# Patient Record
Sex: Female | Born: 1935 | Race: White | Hispanic: No | Smoking: Never smoker
Health system: Southern US, Community
[De-identification: ages and names within clinical notes are randomized; demographics above are authoritative.]

## PROBLEM LIST (undated history)

## (undated) DIAGNOSIS — K649 Unspecified hemorrhoids: Secondary | ICD-10-CM

## (undated) DIAGNOSIS — I499 Cardiac arrhythmia, unspecified: Secondary | ICD-10-CM

## (undated) DIAGNOSIS — K760 Fatty (change of) liver, not elsewhere classified: Secondary | ICD-10-CM

## (undated) DIAGNOSIS — Z9889 Other specified postprocedural states: Secondary | ICD-10-CM

## (undated) DIAGNOSIS — E785 Hyperlipidemia, unspecified: Secondary | ICD-10-CM

## (undated) DIAGNOSIS — I5030 Unspecified diastolic (congestive) heart failure: Secondary | ICD-10-CM

## (undated) DIAGNOSIS — R0600 Dyspnea, unspecified: Secondary | ICD-10-CM

## (undated) DIAGNOSIS — I6529 Occlusion and stenosis of unspecified carotid artery: Secondary | ICD-10-CM

## (undated) DIAGNOSIS — Z950 Presence of cardiac pacemaker: Secondary | ICD-10-CM

## (undated) DIAGNOSIS — R911 Solitary pulmonary nodule: Secondary | ICD-10-CM

## (undated) DIAGNOSIS — G500 Trigeminal neuralgia: Secondary | ICD-10-CM

## (undated) DIAGNOSIS — J309 Allergic rhinitis, unspecified: Secondary | ICD-10-CM

## (undated) DIAGNOSIS — M1990 Unspecified osteoarthritis, unspecified site: Secondary | ICD-10-CM

## (undated) DIAGNOSIS — T8859XA Other complications of anesthesia, initial encounter: Secondary | ICD-10-CM

## (undated) DIAGNOSIS — L659 Nonscarring hair loss, unspecified: Secondary | ICD-10-CM

## (undated) DIAGNOSIS — K5730 Diverticulosis of large intestine without perforation or abscess without bleeding: Secondary | ICD-10-CM

## (undated) DIAGNOSIS — E039 Hypothyroidism, unspecified: Secondary | ICD-10-CM

## (undated) DIAGNOSIS — N3946 Mixed incontinence: Secondary | ICD-10-CM

## (undated) DIAGNOSIS — I4891 Unspecified atrial fibrillation: Secondary | ICD-10-CM

## (undated) DIAGNOSIS — Z86007 Personal history of in-situ neoplasm of skin: Secondary | ICD-10-CM

## (undated) DIAGNOSIS — R600 Localized edema: Secondary | ICD-10-CM

## (undated) DIAGNOSIS — Z86010 Personal history of colonic polyps: Secondary | ICD-10-CM

## (undated) DIAGNOSIS — Z860101 Personal history of adenomatous and serrated colon polyps: Secondary | ICD-10-CM

## (undated) DIAGNOSIS — J4520 Mild intermittent asthma, uncomplicated: Secondary | ICD-10-CM

## (undated) DIAGNOSIS — Z8673 Personal history of transient ischemic attack (TIA), and cerebral infarction without residual deficits: Secondary | ICD-10-CM

## (undated) DIAGNOSIS — D649 Anemia, unspecified: Secondary | ICD-10-CM

## (undated) DIAGNOSIS — R011 Cardiac murmur, unspecified: Secondary | ICD-10-CM

## (undated) DIAGNOSIS — Z973 Presence of spectacles and contact lenses: Secondary | ICD-10-CM

## (undated) DIAGNOSIS — I8390 Asymptomatic varicose veins of unspecified lower extremity: Secondary | ICD-10-CM

## (undated) DIAGNOSIS — Z8679 Personal history of other diseases of the circulatory system: Secondary | ICD-10-CM

## (undated) DIAGNOSIS — T4145XA Adverse effect of unspecified anesthetic, initial encounter: Secondary | ICD-10-CM

## (undated) DIAGNOSIS — M7989 Other specified soft tissue disorders: Secondary | ICD-10-CM

## (undated) HISTORY — PX: OTHER SURGICAL HISTORY: SHX169

## (undated) HISTORY — PX: TRANSTHORACIC ECHOCARDIOGRAM: SHX275

## (undated) HISTORY — DX: Unspecified atrial fibrillation: I48.91

## (undated) HISTORY — DX: Hypothyroidism, unspecified: E03.9

## (undated) HISTORY — DX: Nonscarring hair loss, unspecified: L65.9

## (undated) HISTORY — DX: Fatty (change of) liver, not elsewhere classified: K76.0

## (undated) HISTORY — DX: Hyperlipidemia, unspecified: E78.5

## (undated) HISTORY — DX: Solitary pulmonary nodule: R91.1

## (undated) HISTORY — PX: CARDIOVERSION: SHX1299

## (undated) HISTORY — DX: Occlusion and stenosis of unspecified carotid artery: I65.29

## (undated) HISTORY — PX: DILATION AND CURETTAGE OF UTERUS: SHX78

## (undated) HISTORY — PX: COLONOSCOPY: SHX174

## (undated) HISTORY — PX: EXCISIONAL HEMORRHOIDECTOMY: SHX1541

## (undated) HISTORY — PX: TEE WITH CARDIOVERSION: SHX5442

## (undated) HISTORY — PX: CARDIAC ELECTROPHYSIOLOGY STUDY AND ABLATION: SHX1294

## (undated) HISTORY — PX: CARDIAC ELECTROPHYSIOLOGY MAPPING AND ABLATION: SHX1292

---
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
## 1976-03-23 HISTORY — PX: TUBAL LIGATION: SHX77

## 1976-03-23 HISTORY — PX: APPENDECTOMY: SHX54

## 1998-10-23 HISTORY — PX: MITRAL VALVE ANNULOPLASTY: SHX2038

## 2003-12-17 ENCOUNTER — Other Ambulatory Visit: Admission: RE | Admit: 2003-12-17 | Discharge: 2003-12-17 | Payer: Self-pay | Admitting: Obstetrics and Gynecology

## 2004-01-22 ENCOUNTER — Encounter: Payer: Self-pay | Admitting: Family Medicine

## 2004-01-22 LAB — CONVERTED CEMR LAB

## 2004-01-22 LAB — HM MAMMOGRAPHY

## 2004-02-06 ENCOUNTER — Emergency Department (HOSPITAL_COMMUNITY): Admission: EM | Admit: 2004-02-06 | Discharge: 2004-02-06 | Payer: Self-pay | Admitting: Family Medicine

## 2004-04-28 ENCOUNTER — Ambulatory Visit: Payer: Self-pay | Admitting: Family Medicine

## 2004-05-07 ENCOUNTER — Ambulatory Visit: Payer: Self-pay | Admitting: Family Medicine

## 2004-07-01 ENCOUNTER — Ambulatory Visit: Payer: Self-pay | Admitting: Family Medicine

## 2004-08-19 ENCOUNTER — Ambulatory Visit: Payer: Self-pay | Admitting: Family Medicine

## 2005-01-24 ENCOUNTER — Inpatient Hospital Stay (HOSPITAL_COMMUNITY): Admission: EM | Admit: 2005-01-24 | Discharge: 2005-01-26 | Payer: Self-pay | Admitting: Emergency Medicine

## 2005-02-03 ENCOUNTER — Ambulatory Visit: Payer: Self-pay | Admitting: Family Medicine

## 2005-03-02 ENCOUNTER — Ambulatory Visit: Payer: Self-pay | Admitting: Family Medicine

## 2005-03-30 ENCOUNTER — Ambulatory Visit: Payer: Self-pay | Admitting: Cardiology

## 2005-04-27 ENCOUNTER — Ambulatory Visit: Payer: Self-pay | Admitting: Cardiology

## 2005-12-12 ENCOUNTER — Inpatient Hospital Stay (HOSPITAL_COMMUNITY): Admission: EM | Admit: 2005-12-12 | Discharge: 2005-12-14 | Payer: Self-pay | Admitting: Emergency Medicine

## 2005-12-12 ENCOUNTER — Ambulatory Visit: Payer: Self-pay | Admitting: Internal Medicine

## 2005-12-14 ENCOUNTER — Ambulatory Visit: Payer: Self-pay | Admitting: Internal Medicine

## 2005-12-15 ENCOUNTER — Ambulatory Visit: Payer: Self-pay | Admitting: Family Medicine

## 2005-12-17 ENCOUNTER — Ambulatory Visit: Payer: Self-pay | Admitting: Family Medicine

## 2005-12-21 ENCOUNTER — Ambulatory Visit: Payer: Self-pay | Admitting: Family Medicine

## 2005-12-23 ENCOUNTER — Ambulatory Visit: Payer: Self-pay | Admitting: Family Medicine

## 2005-12-24 ENCOUNTER — Ambulatory Visit: Payer: Self-pay | Admitting: Family Medicine

## 2005-12-28 ENCOUNTER — Ambulatory Visit: Payer: Self-pay | Admitting: Family Medicine

## 2005-12-29 ENCOUNTER — Ambulatory Visit: Payer: Self-pay | Admitting: Cardiology

## 2006-01-01 ENCOUNTER — Ambulatory Visit: Payer: Self-pay | Admitting: Family Medicine

## 2006-01-05 ENCOUNTER — Ambulatory Visit: Payer: Self-pay | Admitting: Family Medicine

## 2006-01-11 ENCOUNTER — Ambulatory Visit: Payer: Self-pay

## 2006-01-11 ENCOUNTER — Ambulatory Visit: Payer: Self-pay | Admitting: Family Medicine

## 2006-01-11 ENCOUNTER — Encounter: Payer: Self-pay | Admitting: Cardiology

## 2006-01-12 ENCOUNTER — Ambulatory Visit: Payer: Self-pay | Admitting: Cardiology

## 2006-01-18 ENCOUNTER — Ambulatory Visit: Payer: Self-pay | Admitting: Family Medicine

## 2006-01-25 ENCOUNTER — Ambulatory Visit: Payer: Self-pay | Admitting: Family Medicine

## 2006-02-02 ENCOUNTER — Ambulatory Visit: Payer: Self-pay | Admitting: Cardiology

## 2006-02-08 ENCOUNTER — Ambulatory Visit: Payer: Self-pay | Admitting: Family Medicine

## 2006-02-22 ENCOUNTER — Ambulatory Visit: Payer: Self-pay | Admitting: Family Medicine

## 2006-03-22 ENCOUNTER — Ambulatory Visit: Payer: Self-pay | Admitting: Family Medicine

## 2006-03-27 ENCOUNTER — Inpatient Hospital Stay: Payer: Self-pay | Admitting: Internal Medicine

## 2006-03-27 ENCOUNTER — Ambulatory Visit: Payer: Self-pay | Admitting: Internal Medicine

## 2006-03-27 ENCOUNTER — Other Ambulatory Visit: Payer: Self-pay

## 2006-04-02 ENCOUNTER — Ambulatory Visit: Payer: Self-pay | Admitting: Family Medicine

## 2006-04-15 ENCOUNTER — Ambulatory Visit: Payer: Self-pay | Admitting: Cardiology

## 2006-04-15 LAB — CONVERTED CEMR LAB
ALT: 25 units/L (ref 0–40)
AST: 23 units/L (ref 0–37)
Albumin: 3.7 g/dL (ref 3.5–5.2)
Alkaline Phosphatase: 41 units/L (ref 39–117)
Bilirubin, Direct: 0.1 mg/dL (ref 0.0–0.3)
HDL: 38.9 mg/dL — ABNORMAL LOW (ref >39.0)
Total Bilirubin: 0.7 mg/dL (ref 0.3–1.2)
Total CHOL/HDL Ratio: 4.3
Total CK: 57 units/L (ref 7–177)
Triglycerides: 123 mg/dL (ref 0–149)
VLDL: 25 mg/dL (ref 0–40)

## 2006-04-19 ENCOUNTER — Ambulatory Visit: Payer: Self-pay | Admitting: Family Medicine

## 2006-04-21 ENCOUNTER — Ambulatory Visit: Payer: Self-pay | Admitting: Family Medicine

## 2006-05-03 ENCOUNTER — Ambulatory Visit: Payer: Self-pay | Admitting: Family Medicine

## 2006-05-05 ENCOUNTER — Ambulatory Visit: Payer: Self-pay | Admitting: Internal Medicine

## 2006-05-05 LAB — CONVERTED CEMR LAB
BUN: 16 mg/dL (ref 6–23)
Basophils Absolute: 0 10*3/uL (ref 0.0–0.1)
Basophils Relative: 0.7 % (ref 0.0–1.0)
CO2: 32 meq/L (ref 19–32)
Calcium: 9.2 mg/dL (ref 8.4–10.5)
Chloride: 106 meq/L (ref 96–112)
Creatinine, Ser: 0.7 mg/dL (ref 0.4–1.2)
Eosinophils Absolute: 0.2 10*3/uL (ref 0.0–0.6)
Eosinophils Relative: 4 % (ref 0.0–5.0)
GFR calc Af Amer: 106 mL/min
GFR calc non Af Amer: 88 mL/min
Glucose, Bld: 93 mg/dL (ref 70–99)
HCT: 38.6 % (ref 36.0–46.0)
Hemoglobin: 13.2 g/dL (ref 12.0–15.0)
INR: 2.8 — ABNORMAL HIGH (ref 0.9–2.0)
Lymphocytes Relative: 39.4 % (ref 12.0–46.0)
MCHC: 34.1 g/dL (ref 30.0–36.0)
MCV: 84.2 fL (ref 78.0–100.0)
Monocytes Absolute: 0.4 10*3/uL (ref 0.2–0.7)
Monocytes Relative: 9.1 % (ref 3.0–11.0)
Neutro Abs: 2.4 10*3/uL (ref 1.4–7.7)
Neutrophils Relative %: 46.8 % (ref 43.0–77.0)
Platelets: 172 10*3/uL (ref 150–400)
Potassium: 4.2 meq/L (ref 3.5–5.1)
Prothrombin Time: 21.3 s — ABNORMAL HIGH (ref 10.0–14.0)
RBC: 4.59 M/uL (ref 3.87–5.11)
RDW: 14.2 % (ref 11.5–14.6)
Sodium: 145 meq/L (ref 135–145)
WBC: 4.9 10*3/uL (ref 4.5–10.5)
aPTT: 36.3 s (ref 26.5–36.5)

## 2006-05-06 ENCOUNTER — Ambulatory Visit (HOSPITAL_COMMUNITY): Admission: RE | Admit: 2006-05-06 | Discharge: 2006-05-06 | Payer: Self-pay | Admitting: Internal Medicine

## 2006-05-06 ENCOUNTER — Encounter: Payer: Self-pay | Admitting: Internal Medicine

## 2006-05-06 ENCOUNTER — Ambulatory Visit: Payer: Self-pay | Admitting: Cardiology

## 2006-05-13 ENCOUNTER — Ambulatory Visit: Admission: RE | Admit: 2006-05-13 | Discharge: 2006-05-13 | Payer: Self-pay | Admitting: Internal Medicine

## 2006-05-14 ENCOUNTER — Ambulatory Visit: Payer: Self-pay | Admitting: Internal Medicine

## 2006-05-14 LAB — CONVERTED CEMR LAB
ALT: 59 units/L — ABNORMAL HIGH (ref 0–40)
Albumin: 3.6 g/dL (ref 3.5–5.2)
Alkaline Phosphatase: 49 units/L (ref 39–117)
Bilirubin, Direct: 0.1 mg/dL (ref 0.0–0.3)
Total Protein: 6.2 g/dL (ref 6.0–8.3)

## 2006-05-17 ENCOUNTER — Ambulatory Visit: Payer: Self-pay | Admitting: Family Medicine

## 2006-05-24 ENCOUNTER — Ambulatory Visit: Payer: Self-pay | Admitting: Family Medicine

## 2006-05-28 ENCOUNTER — Ambulatory Visit: Payer: Self-pay | Admitting: Family Medicine

## 2006-06-03 ENCOUNTER — Ambulatory Visit: Payer: Self-pay | Admitting: Internal Medicine

## 2006-06-07 ENCOUNTER — Ambulatory Visit: Payer: Self-pay | Admitting: Family Medicine

## 2006-06-07 LAB — CONVERTED CEMR LAB
ALT: 32 units/L (ref 0–40)
AST: 29 units/L (ref 0–37)
Albumin: 3.8 g/dL (ref 3.5–5.2)
Alkaline Phosphatase: 41 units/L (ref 39–117)
Bilirubin, Direct: 0.1 mg/dL (ref 0.0–0.3)
Total Bilirubin: 0.7 mg/dL (ref 0.3–1.2)
Total Protein: 6.6 g/dL (ref 6.0–8.3)

## 2006-06-17 ENCOUNTER — Ambulatory Visit: Payer: Self-pay | Admitting: Family Medicine

## 2006-06-18 ENCOUNTER — Ambulatory Visit: Payer: Self-pay | Admitting: Internal Medicine

## 2006-06-18 ENCOUNTER — Ambulatory Visit (HOSPITAL_COMMUNITY): Admission: RE | Admit: 2006-06-18 | Discharge: 2006-06-18 | Payer: Self-pay | Admitting: Internal Medicine

## 2006-06-25 ENCOUNTER — Ambulatory Visit: Payer: Self-pay | Admitting: Family Medicine

## 2006-07-08 ENCOUNTER — Ambulatory Visit: Payer: Self-pay | Admitting: Family Medicine

## 2006-07-12 ENCOUNTER — Ambulatory Visit: Payer: Self-pay | Admitting: Internal Medicine

## 2006-07-12 LAB — CONVERTED CEMR LAB
BUN: 19 mg/dL (ref 6–23)
Basophils Absolute: 0 10*3/uL (ref 0.0–0.1)
Basophils Relative: 0.5 % (ref 0.0–1.0)
CO2: 31 meq/L (ref 19–32)
Calcium: 9.1 mg/dL (ref 8.4–10.5)
Chloride: 107 meq/L (ref 96–112)
Creatinine, Ser: 0.8 mg/dL (ref 0.4–1.2)
Eosinophils Absolute: 0.2 10*3/uL (ref 0.0–0.6)
Eosinophils Relative: 5.2 % — ABNORMAL HIGH (ref 0.0–5.0)
GFR calc Af Amer: 91 mL/min
GFR calc non Af Amer: 75 mL/min
Glucose, Bld: 78 mg/dL (ref 70–99)
HCT: 37.7 % (ref 36.0–46.0)
Hemoglobin: 12.9 g/dL (ref 12.0–15.0)
INR: 2.2 — ABNORMAL HIGH (ref 0.9–2.0)
Lymphocytes Relative: 33.6 % (ref 12.0–46.0)
MCHC: 34.2 g/dL (ref 30.0–36.0)
MCV: 82.9 fL (ref 78.0–100.0)
Monocytes Absolute: 0.5 10*3/uL (ref 0.2–0.7)
Monocytes Relative: 15.1 % — ABNORMAL HIGH (ref 3.0–11.0)
Neutro Abs: 1.5 10*3/uL (ref 1.4–7.7)
Neutrophils Relative %: 45.6 % (ref 43.0–77.0)
Platelets: 154 10*3/uL (ref 150–400)
Potassium: 4.3 meq/L (ref 3.5–5.1)
Prothrombin Time: 18.3 s — ABNORMAL HIGH (ref 10.0–14.0)
RBC: 4.55 M/uL (ref 3.87–5.11)
RDW: 14.6 % (ref 11.5–14.6)
Sodium: 144 meq/L (ref 135–145)
WBC: 3.3 10*3/uL — ABNORMAL LOW (ref 4.5–10.5)
aPTT: 32.7 s (ref 26.5–36.5)

## 2006-07-13 ENCOUNTER — Ambulatory Visit (HOSPITAL_COMMUNITY): Admission: RE | Admit: 2006-07-13 | Discharge: 2006-07-13 | Payer: Self-pay | Admitting: Internal Medicine

## 2006-07-13 ENCOUNTER — Ambulatory Visit: Payer: Self-pay | Admitting: Internal Medicine

## 2006-07-29 ENCOUNTER — Ambulatory Visit: Payer: Self-pay | Admitting: Internal Medicine

## 2006-08-05 ENCOUNTER — Ambulatory Visit: Payer: Self-pay | Admitting: Internal Medicine

## 2006-08-05 ENCOUNTER — Ambulatory Visit: Payer: Self-pay | Admitting: Family Medicine

## 2006-08-05 DIAGNOSIS — I482 Chronic atrial fibrillation, unspecified: Secondary | ICD-10-CM | POA: Insufficient documentation

## 2006-08-05 DIAGNOSIS — I4891 Unspecified atrial fibrillation: Secondary | ICD-10-CM | POA: Insufficient documentation

## 2006-08-05 LAB — CONVERTED CEMR LAB
ALT: 47 units/L — ABNORMAL HIGH (ref 0–40)
AST: 37 units/L (ref 0–37)
Albumin: 3.9 g/dL (ref 3.5–5.2)
Alkaline Phosphatase: 45 units/L (ref 39–117)
Bilirubin, Direct: 0.1 mg/dL (ref 0.0–0.3)
INR: 2
TSH: 7.44 microintl units/mL — ABNORMAL HIGH (ref 0.35–5.50)
Total Bilirubin: 0.7 mg/dL (ref 0.3–1.2)
Total Protein: 6.7 g/dL (ref 6.0–8.3)

## 2006-09-02 ENCOUNTER — Telehealth: Payer: Self-pay | Admitting: Family Medicine

## 2006-09-02 ENCOUNTER — Ambulatory Visit: Payer: Self-pay | Admitting: Family Medicine

## 2006-09-02 DIAGNOSIS — E039 Hypothyroidism, unspecified: Secondary | ICD-10-CM | POA: Insufficient documentation

## 2006-09-02 DIAGNOSIS — K625 Hemorrhage of anus and rectum: Secondary | ICD-10-CM

## 2006-09-02 LAB — CONVERTED CEMR LAB
INR: 2.1
Prothrombin Time: 17.7 s

## 2006-09-03 LAB — CONVERTED CEMR LAB: TSH: 4.8 microintl units/mL (ref 0.35–5.50)

## 2006-09-30 ENCOUNTER — Ambulatory Visit: Payer: Self-pay | Admitting: Gastroenterology

## 2006-09-30 LAB — CONVERTED CEMR LAB
Basophils Absolute: 0.1 10*3/uL (ref 0.0–0.1)
Basophils Relative: 1.5 % — ABNORMAL HIGH (ref 0.0–1.0)
Eosinophils Absolute: 0.2 10*3/uL (ref 0.0–0.6)
Eosinophils Relative: 5.4 % — ABNORMAL HIGH (ref 0.0–5.0)
HCT: 39.1 % (ref 36.0–46.0)
Hemoglobin: 13.3 g/dL (ref 12.0–15.0)
Lymphocytes Relative: 33.2 % (ref 12.0–46.0)
MCHC: 34 g/dL (ref 30.0–36.0)
MCV: 81 fL (ref 78.0–100.0)
Monocytes Absolute: 0.6 10*3/uL (ref 0.2–0.7)
Monocytes Relative: 15.3 % — ABNORMAL HIGH (ref 3.0–11.0)
Neutro Abs: 1.8 10*3/uL (ref 1.4–7.7)
Neutrophils Relative %: 44.6 % (ref 43.0–77.0)
RDW: 14.2 % (ref 11.5–14.6)

## 2006-10-14 ENCOUNTER — Ambulatory Visit: Payer: Self-pay | Admitting: Family Medicine

## 2006-10-14 LAB — CONVERTED CEMR LAB
INR: 1.1
Prothrombin Time: 13.3 s

## 2006-10-21 ENCOUNTER — Ambulatory Visit: Payer: Self-pay | Admitting: Family Medicine

## 2006-10-21 LAB — CONVERTED CEMR LAB
INR: 1.4
Prothrombin Time: 14.8 s

## 2006-10-28 ENCOUNTER — Ambulatory Visit: Payer: Self-pay | Admitting: Family Medicine

## 2006-10-28 LAB — CONVERTED CEMR LAB
INR: 1.9
Prothrombin Time: 17 s

## 2006-11-04 ENCOUNTER — Encounter: Payer: Self-pay | Admitting: Gastroenterology

## 2006-11-04 ENCOUNTER — Ambulatory Visit: Payer: Self-pay | Admitting: Gastroenterology

## 2006-11-04 DIAGNOSIS — D126 Benign neoplasm of colon, unspecified: Secondary | ICD-10-CM

## 2006-11-04 HISTORY — DX: Benign neoplasm of colon, unspecified: D12.6

## 2006-11-04 LAB — CONVERTED CEMR LAB
TSH: 5.64 microintl units/mL — ABNORMAL HIGH (ref 0.35–5.50)
Total CK: 67 units/L (ref 7–177)

## 2006-11-11 ENCOUNTER — Ambulatory Visit: Payer: Self-pay | Admitting: Family Medicine

## 2006-11-11 LAB — CONVERTED CEMR LAB
INR: 1.1
Prothrombin Time: 12.7 s

## 2006-11-24 ENCOUNTER — Ambulatory Visit: Payer: Self-pay | Admitting: Family Medicine

## 2006-12-02 ENCOUNTER — Ambulatory Visit: Payer: Self-pay | Admitting: Internal Medicine

## 2006-12-23 ENCOUNTER — Ambulatory Visit: Payer: Self-pay | Admitting: Family Medicine

## 2007-01-06 ENCOUNTER — Ambulatory Visit: Payer: Self-pay | Admitting: Family Medicine

## 2007-01-20 ENCOUNTER — Ambulatory Visit: Payer: Self-pay | Admitting: Internal Medicine

## 2007-01-28 ENCOUNTER — Ambulatory Visit: Payer: Self-pay | Admitting: Internal Medicine

## 2007-02-03 ENCOUNTER — Ambulatory Visit: Payer: Self-pay | Admitting: Family Medicine

## 2007-02-03 LAB — CONVERTED CEMR LAB
INR: 3
Prothrombin Time: 20.9 s

## 2007-03-03 ENCOUNTER — Ambulatory Visit: Payer: Self-pay | Admitting: Family Medicine

## 2007-03-03 LAB — CONVERTED CEMR LAB
INR: 3.8
Prothrombin Time: 23.5 s

## 2007-03-16 ENCOUNTER — Ambulatory Visit: Payer: Self-pay | Admitting: Family Medicine

## 2007-03-16 LAB — CONVERTED CEMR LAB: Prothrombin Time: 21 s

## 2007-03-31 ENCOUNTER — Ambulatory Visit: Payer: Self-pay | Admitting: Family Medicine

## 2007-04-13 ENCOUNTER — Ambulatory Visit: Payer: Self-pay | Admitting: Internal Medicine

## 2007-04-13 LAB — CONVERTED CEMR LAB
BUN: 16 mg/dL (ref 6–23)
CO2: 30 meq/L (ref 19–32)
Creatinine, Ser: 0.8 mg/dL (ref 0.4–1.2)
Potassium: 4.1 meq/L (ref 3.5–5.1)
Sodium: 142 meq/L (ref 135–145)

## 2007-04-14 ENCOUNTER — Encounter: Payer: Self-pay | Admitting: Internal Medicine

## 2007-04-14 ENCOUNTER — Ambulatory Visit: Payer: Self-pay

## 2007-04-27 ENCOUNTER — Encounter: Payer: Self-pay | Admitting: Family Medicine

## 2007-04-27 DIAGNOSIS — J4520 Mild intermittent asthma, uncomplicated: Secondary | ICD-10-CM | POA: Insufficient documentation

## 2007-04-27 DIAGNOSIS — J309 Allergic rhinitis, unspecified: Secondary | ICD-10-CM | POA: Insufficient documentation

## 2007-04-27 DIAGNOSIS — E785 Hyperlipidemia, unspecified: Secondary | ICD-10-CM | POA: Insufficient documentation

## 2007-04-27 DIAGNOSIS — F418 Other specified anxiety disorders: Secondary | ICD-10-CM | POA: Insufficient documentation

## 2007-04-27 DIAGNOSIS — F329 Major depressive disorder, single episode, unspecified: Secondary | ICD-10-CM

## 2007-04-27 DIAGNOSIS — G4700 Insomnia, unspecified: Secondary | ICD-10-CM | POA: Insufficient documentation

## 2007-04-28 ENCOUNTER — Ambulatory Visit: Payer: Self-pay | Admitting: Family Medicine

## 2007-04-28 DIAGNOSIS — M542 Cervicalgia: Secondary | ICD-10-CM

## 2007-04-28 DIAGNOSIS — N951 Menopausal and female climacteric states: Secondary | ICD-10-CM | POA: Insufficient documentation

## 2007-04-28 DIAGNOSIS — N3946 Mixed incontinence: Secondary | ICD-10-CM | POA: Insufficient documentation

## 2007-04-28 DIAGNOSIS — R32 Unspecified urinary incontinence: Secondary | ICD-10-CM

## 2007-05-05 ENCOUNTER — Ambulatory Visit: Payer: Self-pay | Admitting: Family Medicine

## 2007-05-05 LAB — CONVERTED CEMR LAB

## 2007-05-12 ENCOUNTER — Encounter: Payer: Self-pay | Admitting: Family Medicine

## 2007-06-02 ENCOUNTER — Ambulatory Visit: Payer: Self-pay | Admitting: Family Medicine

## 2007-06-02 LAB — CONVERTED CEMR LAB
INR: 3.4
Prothrombin Time: 22.2 s

## 2007-06-09 ENCOUNTER — Ambulatory Visit: Payer: Self-pay | Admitting: Family Medicine

## 2007-06-09 DIAGNOSIS — E669 Obesity, unspecified: Secondary | ICD-10-CM | POA: Insufficient documentation

## 2007-06-16 ENCOUNTER — Ambulatory Visit: Payer: Self-pay | Admitting: Family Medicine

## 2007-06-16 ENCOUNTER — Telehealth: Payer: Self-pay | Admitting: Family Medicine

## 2007-06-16 LAB — CONVERTED CEMR LAB: Prothrombin Time: 15.1 s

## 2007-07-14 ENCOUNTER — Telehealth: Payer: Self-pay | Admitting: Family Medicine

## 2007-07-14 ENCOUNTER — Ambulatory Visit: Payer: Self-pay | Admitting: Family Medicine

## 2007-07-26 ENCOUNTER — Ambulatory Visit (HOSPITAL_COMMUNITY): Admission: RE | Admit: 2007-07-26 | Discharge: 2007-07-26 | Payer: Self-pay | Admitting: Urology

## 2007-08-11 ENCOUNTER — Ambulatory Visit: Payer: Self-pay | Admitting: Family Medicine

## 2007-08-11 LAB — CONVERTED CEMR LAB
INR: 2.4
Prothrombin Time: 18.7 s

## 2007-08-12 ENCOUNTER — Encounter: Payer: Self-pay | Admitting: Family Medicine

## 2007-08-24 ENCOUNTER — Ambulatory Visit: Payer: Self-pay

## 2007-09-06 ENCOUNTER — Ambulatory Visit: Payer: Self-pay | Admitting: Internal Medicine

## 2007-09-27 ENCOUNTER — Ambulatory Visit: Payer: Self-pay | Admitting: Internal Medicine

## 2007-10-04 ENCOUNTER — Ambulatory Visit: Payer: Self-pay | Admitting: Family Medicine

## 2007-10-04 DIAGNOSIS — M6740 Ganglion, unspecified site: Secondary | ICD-10-CM | POA: Insufficient documentation

## 2007-10-04 LAB — CONVERTED CEMR LAB
INR: 4.3
Prothrombin Time: 25.2 s

## 2007-10-13 ENCOUNTER — Ambulatory Visit: Payer: Self-pay | Admitting: Family Medicine

## 2007-10-27 ENCOUNTER — Ambulatory Visit: Payer: Self-pay | Admitting: Family Medicine

## 2007-10-27 LAB — CONVERTED CEMR LAB: Prothrombin Time: 22.4 s

## 2007-11-07 ENCOUNTER — Ambulatory Visit: Payer: Self-pay | Admitting: Family Medicine

## 2007-11-21 ENCOUNTER — Ambulatory Visit: Payer: Self-pay | Admitting: Family Medicine

## 2007-11-21 LAB — CONVERTED CEMR LAB
INR: 2.7
Prothrombin Time: 19.9 s

## 2007-12-05 ENCOUNTER — Ambulatory Visit: Payer: Self-pay | Admitting: Family Medicine

## 2007-12-05 LAB — CONVERTED CEMR LAB
INR: 1.8
Prothrombin Time: 16.4 s

## 2007-12-19 ENCOUNTER — Ambulatory Visit: Payer: Self-pay | Admitting: Family Medicine

## 2007-12-19 LAB — CONVERTED CEMR LAB: Prothrombin Time: 16.7 s

## 2008-01-09 ENCOUNTER — Ambulatory Visit: Payer: Self-pay | Admitting: Family Medicine

## 2008-01-10 LAB — CONVERTED CEMR LAB
Alkaline Phosphatase: 36 units/L — ABNORMAL LOW (ref 39–117)
Bilirubin, Direct: 0.1 mg/dL (ref 0.0–0.3)
INR: 2.6 — ABNORMAL HIGH (ref 0.8–1.0)
Prothrombin Time: 27.7 s — ABNORMAL HIGH (ref 10.9–13.3)

## 2008-01-30 ENCOUNTER — Ambulatory Visit: Payer: Self-pay | Admitting: Internal Medicine

## 2008-02-13 ENCOUNTER — Encounter: Payer: Self-pay | Admitting: Internal Medicine

## 2008-02-13 ENCOUNTER — Ambulatory Visit: Payer: Self-pay

## 2008-03-22 ENCOUNTER — Telehealth: Payer: Self-pay | Admitting: Family Medicine

## 2008-03-22 ENCOUNTER — Ambulatory Visit: Payer: Self-pay | Admitting: Family Medicine

## 2008-03-22 LAB — CONVERTED CEMR LAB
INR: 5.5 (ref 0.8–1.0)
Prothrombin Time: 53.5 s (ref 10.9–13.3)

## 2008-03-26 ENCOUNTER — Ambulatory Visit: Payer: Self-pay | Admitting: Family Medicine

## 2008-03-26 LAB — CONVERTED CEMR LAB: Prothrombin Time: 18.3 s

## 2008-04-02 ENCOUNTER — Ambulatory Visit: Payer: Self-pay | Admitting: Family Medicine

## 2008-04-02 LAB — CONVERTED CEMR LAB
INR: 3.7
Prothrombin Time: 23.2 s

## 2008-04-09 ENCOUNTER — Ambulatory Visit: Payer: Self-pay | Admitting: Family Medicine

## 2008-04-16 ENCOUNTER — Ambulatory Visit: Payer: Self-pay | Admitting: Family Medicine

## 2008-04-16 DIAGNOSIS — J069 Acute upper respiratory infection, unspecified: Secondary | ICD-10-CM | POA: Insufficient documentation

## 2008-04-16 LAB — CONVERTED CEMR LAB: INR: 2.4

## 2008-04-23 ENCOUNTER — Ambulatory Visit: Payer: Self-pay | Admitting: Family Medicine

## 2008-04-23 LAB — CONVERTED CEMR LAB: INR: 2.6

## 2008-04-30 ENCOUNTER — Ambulatory Visit: Payer: Self-pay | Admitting: Family Medicine

## 2008-04-30 LAB — CONVERTED CEMR LAB
INR: 2.6
Prothrombin Time: 19.7 s

## 2008-05-07 ENCOUNTER — Ambulatory Visit: Payer: Self-pay | Admitting: Family Medicine

## 2008-05-07 LAB — CONVERTED CEMR LAB: INR: 2.6

## 2008-05-21 ENCOUNTER — Ambulatory Visit: Payer: Self-pay | Admitting: Family Medicine

## 2008-05-21 LAB — CONVERTED CEMR LAB: INR: 2.7

## 2008-05-29 ENCOUNTER — Ambulatory Visit: Payer: Self-pay | Admitting: Family Medicine

## 2008-05-29 LAB — CONVERTED CEMR LAB: INR: 2.8

## 2008-06-14 ENCOUNTER — Ambulatory Visit: Payer: Self-pay | Admitting: Family Medicine

## 2008-06-14 LAB — CONVERTED CEMR LAB: INR: 1.6

## 2008-06-18 ENCOUNTER — Ambulatory Visit: Payer: Self-pay | Admitting: Family Medicine

## 2008-06-18 LAB — CONVERTED CEMR LAB
INR: 2.9
Prothrombin Time: 20.6 s

## 2008-06-21 ENCOUNTER — Ambulatory Visit: Payer: Self-pay | Admitting: Family Medicine

## 2008-06-28 ENCOUNTER — Encounter: Payer: Self-pay | Admitting: Internal Medicine

## 2008-07-02 ENCOUNTER — Ambulatory Visit: Payer: Self-pay | Admitting: Family Medicine

## 2008-07-02 LAB — CONVERTED CEMR LAB: Prothrombin Time: 19.4 s

## 2008-07-16 ENCOUNTER — Ambulatory Visit: Payer: Self-pay | Admitting: Family Medicine

## 2008-07-16 ENCOUNTER — Ambulatory Visit: Payer: Self-pay | Admitting: Internal Medicine

## 2008-07-17 ENCOUNTER — Encounter (INDEPENDENT_AMBULATORY_CARE_PROVIDER_SITE_OTHER): Payer: Self-pay | Admitting: *Deleted

## 2008-08-13 ENCOUNTER — Ambulatory Visit: Payer: Self-pay | Admitting: Family Medicine

## 2008-08-13 LAB — CONVERTED CEMR LAB: Prothrombin Time: 19 s

## 2008-09-06 ENCOUNTER — Ambulatory Visit: Payer: Self-pay | Admitting: Family Medicine

## 2008-09-06 LAB — CONVERTED CEMR LAB
INR: 1.6
Prothrombin Time: 15.6 s

## 2008-09-20 ENCOUNTER — Ambulatory Visit: Payer: Self-pay | Admitting: Family Medicine

## 2008-09-20 LAB — CONVERTED CEMR LAB: Prothrombin Time: 16.1 s

## 2008-09-25 ENCOUNTER — Ambulatory Visit: Payer: Self-pay | Admitting: Family Medicine

## 2008-09-26 ENCOUNTER — Telehealth: Payer: Self-pay | Admitting: Family Medicine

## 2008-09-27 ENCOUNTER — Ambulatory Visit: Payer: Self-pay | Admitting: Family Medicine

## 2008-09-27 LAB — CONVERTED CEMR LAB: Prothrombin Time: 17.5 s

## 2008-10-12 ENCOUNTER — Ambulatory Visit: Payer: Self-pay | Admitting: Family Medicine

## 2008-10-12 LAB — CONVERTED CEMR LAB: INR: 2.1

## 2008-10-22 ENCOUNTER — Encounter: Payer: Self-pay | Admitting: Internal Medicine

## 2008-10-23 LAB — CONVERTED CEMR LAB: INR: 2.2

## 2008-11-08 ENCOUNTER — Ambulatory Visit: Payer: Self-pay | Admitting: Family Medicine

## 2008-11-08 LAB — CONVERTED CEMR LAB
INR: 1.6
Prothrombin Time: 15.5 s

## 2008-11-22 ENCOUNTER — Ambulatory Visit: Payer: Self-pay | Admitting: Family Medicine

## 2008-11-22 LAB — CONVERTED CEMR LAB
INR: 2.4
Prothrombin Time: 18.7 s

## 2008-12-20 ENCOUNTER — Ambulatory Visit: Payer: Self-pay | Admitting: Internal Medicine

## 2008-12-20 ENCOUNTER — Ambulatory Visit: Payer: Self-pay | Admitting: Family Medicine

## 2008-12-20 DIAGNOSIS — J984 Other disorders of lung: Secondary | ICD-10-CM | POA: Insufficient documentation

## 2008-12-20 LAB — CONVERTED CEMR LAB: Prothrombin Time: 18.4 s

## 2008-12-21 ENCOUNTER — Telehealth (INDEPENDENT_AMBULATORY_CARE_PROVIDER_SITE_OTHER): Payer: Self-pay | Admitting: *Deleted

## 2008-12-21 LAB — CONVERTED CEMR LAB
BUN: 14 mg/dL (ref 6–23)
Calcium: 9.3 mg/dL (ref 8.4–10.5)
Creatinine, Ser: 0.8 mg/dL (ref 0.4–1.2)
GFR calc non Af Amer: 74.49 mL/min (ref >60)
Glucose, Bld: 95 mg/dL (ref 70–99)
Magnesium: 2.1 mg/dL (ref 1.5–2.5)
Potassium: 4.4 meq/L (ref 3.5–5.1)

## 2009-01-01 ENCOUNTER — Telehealth: Payer: Self-pay | Admitting: Internal Medicine

## 2009-01-08 ENCOUNTER — Encounter: Payer: Self-pay | Admitting: Internal Medicine

## 2009-01-16 ENCOUNTER — Ambulatory Visit: Payer: Self-pay | Admitting: Family Medicine

## 2009-02-06 ENCOUNTER — Telehealth (INDEPENDENT_AMBULATORY_CARE_PROVIDER_SITE_OTHER): Payer: Self-pay | Admitting: *Deleted

## 2009-02-18 ENCOUNTER — Ambulatory Visit: Payer: Self-pay | Admitting: Family Medicine

## 2009-03-19 ENCOUNTER — Ambulatory Visit: Payer: Self-pay | Admitting: Family Medicine

## 2009-03-19 LAB — CONVERTED CEMR LAB
INR: 2.1
Prothrombin Time: 17.9 s

## 2009-04-17 ENCOUNTER — Ambulatory Visit: Payer: Self-pay | Admitting: Family Medicine

## 2009-04-17 LAB — CONVERTED CEMR LAB: Prothrombin Time: 16.4 s

## 2009-05-01 ENCOUNTER — Ambulatory Visit: Payer: Self-pay | Admitting: Family Medicine

## 2009-05-01 LAB — CONVERTED CEMR LAB
INR: 1.9
Prothrombin Time: 16.8 s

## 2009-05-29 ENCOUNTER — Ambulatory Visit: Payer: Self-pay | Admitting: Family Medicine

## 2009-06-07 ENCOUNTER — Ambulatory Visit: Payer: Self-pay | Admitting: Family Medicine

## 2009-06-12 ENCOUNTER — Ambulatory Visit: Payer: Self-pay | Admitting: Internal Medicine

## 2009-06-12 ENCOUNTER — Telehealth: Payer: Self-pay | Admitting: Family Medicine

## 2009-06-13 ENCOUNTER — Ambulatory Visit: Payer: Self-pay | Admitting: Vascular Surgery

## 2009-06-13 ENCOUNTER — Inpatient Hospital Stay (HOSPITAL_COMMUNITY): Admission: EM | Admit: 2009-06-13 | Discharge: 2009-06-14 | Payer: Self-pay | Admitting: Emergency Medicine

## 2009-06-13 ENCOUNTER — Encounter (INDEPENDENT_AMBULATORY_CARE_PROVIDER_SITE_OTHER): Payer: Self-pay | Admitting: Internal Medicine

## 2009-06-19 ENCOUNTER — Ambulatory Visit: Payer: Self-pay | Admitting: Family Medicine

## 2009-06-19 LAB — CONVERTED CEMR LAB: INR: 2.2

## 2009-07-01 ENCOUNTER — Ambulatory Visit: Payer: Self-pay | Admitting: Family Medicine

## 2009-07-16 ENCOUNTER — Ambulatory Visit: Payer: Self-pay | Admitting: Internal Medicine

## 2009-07-29 ENCOUNTER — Ambulatory Visit: Payer: Self-pay | Admitting: Family Medicine

## 2009-07-29 LAB — CONVERTED CEMR LAB: Prothrombin Time: 26.9 s

## 2009-08-03 ENCOUNTER — Ambulatory Visit: Payer: Self-pay | Admitting: Internal Medicine

## 2009-08-03 ENCOUNTER — Encounter: Payer: Self-pay | Admitting: Family Medicine

## 2009-08-07 ENCOUNTER — Telehealth: Payer: Self-pay | Admitting: Family Medicine

## 2009-08-13 ENCOUNTER — Encounter (INDEPENDENT_AMBULATORY_CARE_PROVIDER_SITE_OTHER): Payer: Self-pay | Admitting: *Deleted

## 2009-08-13 ENCOUNTER — Encounter: Admission: RE | Admit: 2009-08-13 | Discharge: 2009-08-13 | Payer: Self-pay | Admitting: Obstetrics and Gynecology

## 2009-08-27 ENCOUNTER — Ambulatory Visit: Payer: Self-pay | Admitting: Family Medicine

## 2009-08-27 ENCOUNTER — Telehealth: Payer: Self-pay | Admitting: Family Medicine

## 2009-08-27 DIAGNOSIS — R109 Unspecified abdominal pain: Secondary | ICD-10-CM | POA: Insufficient documentation

## 2009-08-27 LAB — CONVERTED CEMR LAB
INR: 3.1
Prothrombin Time: 37.6 s

## 2009-08-29 ENCOUNTER — Encounter (INDEPENDENT_AMBULATORY_CARE_PROVIDER_SITE_OTHER): Payer: Self-pay | Admitting: *Deleted

## 2009-09-18 ENCOUNTER — Ambulatory Visit: Payer: Self-pay | Admitting: Gastroenterology

## 2009-09-18 DIAGNOSIS — Z860100 Personal history of colon polyps, unspecified: Secondary | ICD-10-CM

## 2009-09-18 HISTORY — DX: Personal history of colon polyps, unspecified: Z86.0100

## 2009-09-19 ENCOUNTER — Encounter: Admission: RE | Admit: 2009-09-19 | Discharge: 2009-09-19 | Payer: Self-pay | Admitting: Obstetrics and Gynecology

## 2009-09-19 ENCOUNTER — Telehealth: Payer: Self-pay | Admitting: Gastroenterology

## 2009-09-24 ENCOUNTER — Ambulatory Visit: Payer: Self-pay | Admitting: Family Medicine

## 2009-09-24 LAB — CONVERTED CEMR LAB: Prothrombin Time: 30.5 s

## 2009-09-30 ENCOUNTER — Telehealth: Payer: Self-pay | Admitting: Gastroenterology

## 2009-10-02 ENCOUNTER — Encounter (INDEPENDENT_AMBULATORY_CARE_PROVIDER_SITE_OTHER): Payer: Self-pay | Admitting: *Deleted

## 2009-10-11 ENCOUNTER — Encounter (INDEPENDENT_AMBULATORY_CARE_PROVIDER_SITE_OTHER): Payer: Self-pay | Admitting: *Deleted

## 2009-10-15 ENCOUNTER — Ambulatory Visit: Payer: Self-pay | Admitting: Gastroenterology

## 2009-10-22 ENCOUNTER — Ambulatory Visit: Payer: Self-pay | Admitting: Family Medicine

## 2009-10-22 LAB — CONVERTED CEMR LAB
INR: 2.5
Prothrombin Time: 29.9 s

## 2009-10-28 ENCOUNTER — Ambulatory Visit: Payer: Self-pay | Admitting: Gastroenterology

## 2009-10-28 LAB — HM COLONOSCOPY

## 2009-10-30 ENCOUNTER — Encounter: Payer: Self-pay | Admitting: Gastroenterology

## 2009-11-12 ENCOUNTER — Telehealth: Payer: Self-pay | Admitting: Family Medicine

## 2009-11-26 ENCOUNTER — Ambulatory Visit: Payer: Self-pay | Admitting: Family Medicine

## 2009-12-10 ENCOUNTER — Telehealth: Payer: Self-pay | Admitting: Internal Medicine

## 2009-12-24 ENCOUNTER — Ambulatory Visit: Payer: Self-pay | Admitting: Family Medicine

## 2009-12-24 LAB — CONVERTED CEMR LAB
INR: 2.2
Prothrombin Time: 26.2 s

## 2010-01-21 ENCOUNTER — Ambulatory Visit: Payer: Self-pay | Admitting: Family Medicine

## 2010-01-21 LAB — CONVERTED CEMR LAB: INR: 2.4

## 2010-01-30 ENCOUNTER — Encounter: Payer: Self-pay | Admitting: Cardiology

## 2010-01-30 ENCOUNTER — Ambulatory Visit: Payer: Self-pay | Admitting: Internal Medicine

## 2010-02-03 ENCOUNTER — Telehealth (INDEPENDENT_AMBULATORY_CARE_PROVIDER_SITE_OTHER): Payer: Self-pay | Admitting: *Deleted

## 2010-02-03 LAB — CONVERTED CEMR LAB
Calcium: 9.3 mg/dL (ref 8.4–10.5)
GFR calc non Af Amer: 101.56 mL/min (ref >60)
Potassium: 3.9 meq/L (ref 3.5–5.1)
Sodium: 142 meq/L (ref 135–145)

## 2010-02-11 ENCOUNTER — Ambulatory Visit: Payer: Self-pay | Admitting: Internal Medicine

## 2010-03-03 ENCOUNTER — Ambulatory Visit: Payer: Self-pay | Admitting: Family Medicine

## 2010-03-05 ENCOUNTER — Telehealth: Payer: Self-pay | Admitting: Internal Medicine

## 2010-03-05 ENCOUNTER — Telehealth: Payer: Self-pay | Admitting: Family Medicine

## 2010-03-05 LAB — CONVERTED CEMR LAB
BUN: 19 mg/dL (ref 6–23)
Chloride: 103 meq/L (ref 96–112)
Glucose, Bld: 79 mg/dL (ref 70–99)
Potassium: 4.3 meq/L (ref 3.5–5.1)

## 2010-03-20 ENCOUNTER — Ambulatory Visit: Payer: Self-pay | Admitting: Family Medicine

## 2010-03-31 ENCOUNTER — Ambulatory Visit
Admission: RE | Admit: 2010-03-31 | Discharge: 2010-03-31 | Payer: Self-pay | Source: Home / Self Care | Attending: Family Medicine | Admitting: Family Medicine

## 2010-04-02 ENCOUNTER — Telehealth: Payer: Self-pay | Admitting: Internal Medicine

## 2010-04-20 LAB — CONVERTED CEMR LAB
Albumin: 4 g/dL (ref 3.5–5.2)
Alkaline Phosphatase: 43 units/L (ref 39–117)
BUN: 17 mg/dL (ref 6–23)
CO2: 31 meq/L (ref 19–32)
Chloride: 104 meq/L (ref 96–112)
Creatinine, Ser: 1 mg/dL (ref 0.4–1.2)
Magnesium: 2.3 mg/dL (ref 1.5–2.5)
Potassium: 4 meq/L (ref 3.5–5.1)
Total Bilirubin: 1.1 mg/dL (ref 0.3–1.2)

## 2010-04-23 NOTE — Letter (Signed)
Summary: [facility]   Imported By: Lanelle Bal 08/13/2009 10:24:13  _____________________________________________________________________  External Attachment:    Type:   Image     Comment:   External Document

## 2010-04-23 NOTE — Progress Notes (Signed)
Summary: Colon Scheduled   Phone Note Call from Patient Call back at Home Phone [phone]   Caller: Patient Call For: Dr. Arlyce Dice Reason for Call: Talk to Nurse Summary of Call: called pt to resch from [facility] list... pt would like to skip ov and sch recall COL for August... however, pt is still having abd pain... would like to discuss with nurse what is best thing to do... (pt still on BMP list, please sch from [facility] list) Initial call taken by: Vallarie Mare,  September 30, 2009 10:08 AM  Follow-up for Phone Call        DR.Desirai Traxler--You saw pt. last on 09-18-09. Her f/u ov needs to be rescheduled due to a schedule change. Pt. wants to know if she can just get her Colon done and f/u with you after that. (She is on Coumadin) PLEASE ADVISE  Follow-up by: Laureen Ochs LPN,  September 30, 2009 10:20 AM  Additional Follow-up for Phone Call Additional follow up Details #1::        ok Additional Follow-up by: Louis Meckel MD,  September 30, 2009 11:45 AM    Additional Follow-up for Phone Call Additional follow up Details #2::    Message left for patient to callback. Laureen Ochs LPN  September 30, 2009 1:06 PM Message left for patient to callback. Laureen Ochs LPN  October 01, 2009 9:16 AM  Pt. has scheduled her previsit on 10-15-09 at 8:30am and her Colonoscopy on 10-28-09 at 2:30pm in [facility]. I will send Dr.Klein an anticoag. modification letter for instructions on coumadin. Follow-up by: Laureen Ochs LPN,  October 02, 2009 4:50 PM  Additional Follow-up for Phone Call Additional follow up Details #3:: Details for Additional Follow-up Action Taken: O.K. per Dr.Klein for pt. to hold Coumadin for 5 days prior to Colonoscopy. Information put on pt. previsit appt. and pt. aware. Additional Follow-up by: Laureen Ochs LPN,  October 07, 2009 9:22 AM

## 2010-04-23 NOTE — Procedures (Signed)
Summary: Colonoscopy  Patient: [name] Note: All result statuses are Final unless otherwise noted.  Tests: (1) Colonoscopy (COL)   COL Colonoscopy           DONE     [facility]     [address]     [place], [place]  [address]           COLONOSCOPY PROCEDURE REPORT           PATIENT:  [name], Key  MR#:  [id number]     BIRTHDATE:  Aug 12, 1932, 77 yrs. old  GENDER:  female           ENDOSCOPIST:  Barbette Hair. Arlyce Dice, MD     Referred by:           PROCEDURE DATE:  10/28/2009     PROCEDURE:  Colonoscopy with snare polypectomy     ASA CLASS:  Class II     INDICATIONS:  1) screening  2) history of pre-cancerous     (adenomatous) colon polyps           MEDICATIONS:   Fentanyl 37.5 mcg IV, Versed 5 mg IV           DESCRIPTION OF PROCEDURE:   After the risks benefits and     alternatives of the procedure were thoroughly explained, informed     consent was obtained.  Digital rectal exam was performed and     revealed no abnormalities.   The LB [id number] endoscope     was introduced through the anus and advanced to the cecum, which     was identified by both the appendix and ileocecal valve, without     limitations.  The quality of the prep was excellent, using     MoviPrep.  The instrument was then slowly withdrawn as the colon     was fully examined.     <<PROCEDUREIMAGES>>           FINDINGS:  Two polyps were found at the splenic flexure. 2 3mm     sessile polyps - removed with cold polypectomy snare (see image2     and image9). 1 polyp retrieved and submitted to pathology     Diverticula were found in the sigmoid to descending colon segments     (see image1). Few diverticula  This was otherwise a normal     examination of the colon (see image3, image4, image5, image6,     image7, image10, image12, image13, and image14).   Retroflexed     views in the rectum revealed no abnormalities.    The time to     cecum =  5.75  minutes. The scope was then withdrawn  (time =  12.0     min) from the patient and the procedure completed.           COMPLICATIONS:  None           ENDOSCOPIC IMPRESSION:     1) Two polyps at the splenic flexure     2) Diverticula in the sigmoid to descending colon segments     3) Otherwise normal examination     RECOMMENDATIONS:     1) If the polyp(s) removed today are proven to be adenomatous     (pre-cancerous) polyps, you will need a repeat colonoscopy in 5     years. Otherwise you should continue to follow colorectal cancer     screening guidelines for "routine risk" patients with colonoscopy  in 10 years.           REPEAT EXAM:  You will receive a letter from Dr. Arlyce Dice in 1-2     weeks, after reviewing the final pathology, with followup     recommendations.           ______________________________     Barbette Hair Arlyce Dice, MD           CC: [name], MD           n.     [name] at 10/28/2009 03:18 PM           Page 2 of 3   [name], [name], [phone]  Note: An exclamation mark (!) indicates a result that was not dispersed into the flowsheet. Document Creation Date: 10/28/2009 3:20 PM _______________________________________________________________________  (1) Order result status: Final Collection or observation date-time: 10/28/2009 15:08 Requested date-time:  Receipt date-time:  Reported date-time:  Referring Physician:   Ordering Physician: Melvia Heaps [phone]) Specimen Source:  Source: [name] Order Number: [id number] Lab site:   Appended Document: Colonoscopy     Procedures Next Due Date:    Colonoscopy: 10/2014

## 2010-04-23 NOTE — Letter (Signed)
Summary: [facility] Instructions  [facility]  [address] [place], [place] [address]   Phone: [phone]  Fax: [phone]       [name]    Oct 13, 1932    MRN: [id number]        Procedure Day /Date: Monday 10-28-09     Arrival Time: 1:30 P.M.     Procedure Time: 2:00 P.M.     Location of Procedure:                    _x_  [facility] (4th Floor)   PREPARATION FOR COLONOSCOPY WITH MOVIPREP   Starting 5 days prior to your procedure  wed. 10-23-09 do not eat nuts, seeds, popcorn, corn, beans, peas,  salads, or any raw vegetables.  Do not take any fiber supplements (e.g. Metamucil, Citrucel, and Benefiber).  THE DAY BEFORE YOUR PROCEDURE         DATE: 10-27-09  DAY: Sunday  1.  Drink clear liquids the entire day-NO SOLID FOOD  2.  Do not drink anything colored red or purple.  Avoid juices with pulp.  No orange juice.  3.  Drink at least 64 oz. (8 glasses) of fluid/clear liquids during the day to prevent dehydration and help the prep work efficiently.  CLEAR LIQUIDS INCLUDE: Water Jello Ice Popsicles Tea (sugar ok, no milk/cream) Powdered fruit flavored drinks Coffee (sugar ok, no milk/cream) Gatorade Juice: apple, white grape, white cranberry  Lemonade Clear bullion, consomm, broth Carbonated beverages (any kind) Strained chicken noodle soup Hard Candy                             4.  In the morning, mix first dose of MoviPrep solution:    Empty 1 Pouch A and 1 Pouch B into the disposable container    Add lukewarm drinking water to the top line of the container. Mix to dissolve    Refrigerate (mixed solution should be used within 24 hrs)  5.  Begin drinking the prep at 5:00 p.m. The MoviPrep container is divided by 4 marks.   Every 15 minutes drink the solution down to the next mark (approximately 8 oz) until the full liter is complete.   6.  Follow completed prep with 16 oz of clear liquid of your choice (Nothing red or purple).   Continue to drink clear liquids until bedtime.  7.  Before going to bed, mix second dose of MoviPrep solution:    Empty 1 Pouch A and 1 Pouch B into the disposable container    Add lukewarm drinking water to the top line of the container. Mix to dissolve    Refrigerate  THE DAY OF YOUR PROCEDURE      DATE: 10-28-09  DAY: Monday  Beginning at  9:30 a.m. (5 hours before procedure):         1. Every 15 minutes, drink the solution down to the next mark (approx 8 oz) until the full liter is complete.  2. Follow completed prep with 16 oz. of clear liquid of your choice.    3. You may drink clear liquids until  12:30 p.m. (2 HOURS BEFORE PROCEDURE).   MEDICATION INSTRUCTIONS  Unless otherwise instructed, you should take regular prescription medications with a small sip of water   as early as possible the morning of your procedure.     Stop taking Coumadin on   10-23-09 Wed. (5 days before  procedure).           OTHER INSTRUCTIONS  You will need a responsible adult at least 75 years of age to accompany you and drive you home.   This person must remain in the waiting room during your procedure.  Wear loose fitting clothing that is easily removed.  Leave jewelry and other valuables at home.  However, you may wish to bring a book to read or  an iPod/MP3 player to listen to music as you wait for your procedure to start.  Remove all body piercing jewelry and leave at home.  Total time from sign-in until discharge is approximately 2-3 hours.  You should go home directly after your procedure and rest.  You can resume normal activities the  day after your procedure.  The day of your procedure you should not:   Drive   Make legal decisions   Operate machinery   Drink alcohol   Return to work  You will receive specific instructions about eating, activities and medications before you leave.    The above instructions have been reviewed and explained to me by   Ezra Sites  RN  October 15, 2009 9:02 AM    I fully understand and can verbalize these instructions _____________________________ Date _________

## 2010-04-23 NOTE — Progress Notes (Signed)
Summary: Change in meds   ---- Converted from flag ---- ---- 09/19/2009 8:55 AM, Louis Meckel MD wrote: Let's try local heat and ultram 50mg  q6h as needed.  f/u with myself in 3 weeks and with Dr. Milinda Antis  ---- 09/19/2009 8:22 AM, Harlow Mares CMA (AAMA) wrote: Dr Arlyce Dice This was Dr. Lucretia Roers response on decreasing [name] coumadin.  Hulan Saas  ---- 09/18/2009 5:06 PM, Colon Flattery Tower MD wrote: I actually hate to give any nsaids to pts on coumadin - especially in folks  over 65 -- the rate of complications (both bleeding and GI) go way up  let me know if you think this is absolutely necessary or if you have another option ---- Idamae Schuller T ---- 09/18/2009 3:31 PM, Harlow Mares CMA (AAMA) wrote: Dr. Arlyce Dice would like to know about [name] decreasing her Coumadin while she is on Clinoril, sh will be on it two times a day for 14 days then as needed. Please advise. We have advised her not to start Clinoril until we hear back about her Coumadin.   Thank you  Harlow Mares, CMA Duncan Dull) ------------------------------  Phone Note Outgoing Call Call back at Work Phone [phone]   Call placed by: Harlow Mares CMA Duncan Dull),  September 19, 2009 9:15 AM Call placed to: Patient Summary of Call: Called pt advised her that the clinoril Dr. Arlyce Dice rxed her yesterday per Dr. Arlyce Dice and Dr. Milinda Antis has been changed she should use heat and Ultram 50mg  as needed every 6 hours and I have moved her appt up 10/14/2009 arriving at 4pm. I [facility].  Initial call taken by: Harlow Mares CMA (AAMA),  September 19, 2009 9:31 AM    New/Updated Medications: ULTRAM 50 MG TABS (TRAMADOL HCL) take one by mouth every 6 hours as needed Prescriptions: ULTRAM 50 MG TABS (TRAMADOL HCL) take one by mouth every 6 hours as needed  #90 x 0   Entered by:   Harlow Mares CMA (AAMA)   Authorized by:   Louis Meckel MD   Signed by:   Harlow Mares CMA (AAMA) on 09/19/2009   Method used:   Telephoned to ...       [facility] [address])       [address]       [place], [place]  [address]       Ph: [phone]       Fax: [phone]   RxID:   [id number]   Appended Document: Change in meds pt also advised to make an appt with Dr. Milinda Antis.

## 2010-04-23 NOTE — Progress Notes (Signed)
Summary: numbness in jaw  Phone Note Call from Patient   Caller: Patient Call For: Judith Part MD Summary of Call: Pt states that she is having some numbness and tingling in her left jaw and has noticed that she is having a hard time putting words together. She said she has hx of TIA. Advised pt to go to ER for evaluation. Initial call taken by: Lowella Petties CMA,  June 12, 2009 9:22 AM  Follow-up for Phone Call        I agree- go to ER now -- by EMs if necessary  Follow-up by: Judith Part MD,  June 12, 2009 10:49 AM  Additional Follow-up for Phone Call Additional follow up Details #1::        Called pt, no answer.  Left message asking her to call with an update when she can. Additional Follow-up by: Lowella Petties CMA,  June 12, 2009 10:55 AM

## 2010-04-23 NOTE — Progress Notes (Signed)
Summary: refill meds   Phone Note Refill Request Call back at Home Phone [phone] Message from:  Patient on December 10, 2009 9:29 AM  Refills Requested: Medication #1:  TIKOSYN 250 MCG CAPS take 1 tab q12 hours [facility] [phone].   Method Requested: Fax to Mail Away Pharmacy Initial call taken by: Lorne Skeens,  December 10, 2009 9:29 AM  Follow-up for Phone Call       Follow-up by: Judithe Modest CMA,  December 10, 2009 10:39 AM    Prescriptions: Joice Lofts 250 MCG CAPS (DOFETILIDE) take 1 tab q12 hours  #60 x 3   Entered by:   Judithe Modest CMA   Authorized by:   Nathen May, MD, [facility]   Signed by:   Judithe Modest CMA on 12/10/2009   Method used:   Faxed to ...       [facility] (mail-order)       [address]       [place], [place]  [id number]       Ph: [phone]       Fax: [phone]   RxID:   [id number]

## 2010-04-23 NOTE — Progress Notes (Signed)
Summary: Warfarin 4mg  and Warfarin 1mg  rx  Phone Note Refill Request Call back at [phone] Message from:  [facility] [address] on November 12, 2009 3:58 PM  Refills Requested: Medication #1:  COUMADIN 4 MG TABS 1 tab once daily as directed   Last Refilled: 08/02/2009 [facility] [address] electronically requested Warfarin 4mg  take one tablet by mouth every day as directed.#90 Last refilled 08/02/09 and also requested Warfarin 1mg  which is not on med list with instructions to take one tablet by mouth as directed. Last refill 08/02/09.Please advise.    Method Requested: Telephone to Pharmacy Initial call taken by: Lewanda Rife LPN,  November 12, 2009 4:01 PM  Follow-up for Phone Call        px written on EMR for call in  Follow-up by: Judith Part MD,  November 12, 2009 4:09 PM    New/Updated Medications: COUMADIN 1 MG TABS (WARFARIN SODIUM) take by mouth as directed Prescriptions: COUMADIN 4 MG TABS (WARFARIN SODIUM) 1 tab once daily as directed  #90 x 3   Entered by:   Lewanda Rife LPN   Authorized by:   Judith Part MD   Signed by:   Lewanda Rife LPN on 45/40/9811   Method used:   Electronically to        [facility] [address] ([facility]ail)       [address]       [place], [place]  [address]       Ph: [phone]       Fax: [phone]   RxID:   [id number] COUMADIN 1 MG TABS (WARFARIN SODIUM) take by mouth as directed  #90 x 3   Entered by:   Lewanda Rife LPN   Authorized by:   Judith Part MD   Signed by:   Lewanda Rife LPN on 66/44/0347   Method used:   Electronically to        [facility] [address]ail[facility]       [address], [address]       [place], [place]  [address]       Ph: [phone]       Fax: [phone]   RxID:   [id number]

## 2010-04-23 NOTE — Assessment & Plan Note (Signed)
Summary: f48m/sl      Allergies Added:   Visit Type:  6 month follow up Referring Provider:  Roxy Manns, MD Primary Provider:  Roxy Manns, MD  CC:  no complaints.  History of Present Illness: [name] is seen following an atrial fibrillation ablation undertaken at [facility] in March2010. The postprocedural interval was complicated by recurrent atrial arrhythmias prompting   initiation of Tikosyn following cardioversion for atrial tachycardia.  She has been maintained on the Tikosyn. She has had no significant arrhythmias.  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) 6)  Pulmonary Nodule  (ICD-518.89) 7)  Atrial Fibrillation  (ICD-427.31) 8)  Aftercare, Long-term Use, Anticoagulants  (ICD-V58.61) 9)  Obesity  (ICD-278.00) 10)  Encounter For Therapeutic Drug Monitoring  (ICD-V58.83) 11)  Uri  (ICD-465.9) 12)  Ganglion Cyst  (ICD-727.43) 13)  Adenomatous Colonic Polyp  (ICD-211.3) 14)  Menopause-related Vasomotor Symptoms  (ICD-627.2) 15)  Urinary Incontinence  (ICD-788.30) 16)  Neck Pain  (ICD-723.1) 17)  Insomnia  (ICD-780.52) 18)  Hyperlipidemia  (ICD-272.4) 19)  Depression  (ICD-311) 20)  Asthma  (ICD-493.90) 21)  Allergic Rhinitis  (ICD-477.9) 22)  Rectal Bleeding  (ICD-569.3) 23)  Hypothyroidism Nos  (ICD-244.9)  Current Medications (verified): 1)  Coumadin 4 Mg Tabs (Warfarin Sodium) .[name].. 1 Tab Once Daily As Directed 2)  Multivitamins   Tabs (Multiple Vitamin) .... Take One By Mouth Daily 3)  Tikosyn 250 Mcg Caps (Dofetilide) .... Take 1 Tab Q12 Hours 4)  Coumadin 1 Mg Tabs (Warfarin Sodium) .... Take By Mouth As Directed  Allergies (verified): 1)  ! Penicillin 2)  ! * Statins 3)  ! Amiodarone  Hcl  Past History:  Past Surgical History: Last updated: 09/18/2009 Mitral valve replacement Pilonidal cyst removal Colonoscopy (2001) Exercise stress test (04/2005) CT of abd and pelvis- neg (12/2005) Carotid doppler- no stenosis (12/2005) Gross hematuria on anticoagulants, neg urol work up (12/2005) Admit- syncope, ? bradycardia,  cardiac and neuro work up Colonoscopy- polyps (10/2006) TIA admit 3/11 - nl imaging and echo , with mod carotid stenosis (had vascular consult) Appendectomy  Family History: Last updated: 09/18/2009 Father: ETOH, bladder and lung cancer- smoker Mother: HTN Siblings: none No FH of Colon Cancer:  Social History: Last updated: 09/18/2009 Retired Widowed Six Childern Patient has never smoked.  Alcohol Use - no Daily Caffeine Use: 2 daily  Illicit Drug Use - no  Risk Factors: Smoking Status: never (09/18/2009)  Past Medical History: Current Problems:  JUNCTIONAL RHYTHM (ICD-427.81) Cardiomyopathy-rate-related-resolved PULMONARY NODULE (ICD-518.89) Atrial fibrillation-status post PCI-[name]-2010-on Tikosyn  depression /asthma allergic rhinitis Colonic polyps hypothyroidism carotid stenosis mild (hosp 3/11)- consult by vasc / Dr Early    Vital Signs:  Patient profile:   77 year old female Height:      65.5 inches Weight:      170.75 pounds BMI:     28.08 Pulse rate:   78 / minute BP sitting:   125 / 58  (left arm) Cuff size:   regular  Vitals Entered By: Caralee Ates CMA (January 30, 2010 8:56 AM)  Physical Exam  General:  The patient was alert and oriented in no acute distress. HEENT Normal.  Neck veins were flat, carotids were brisk.  Lungs were clear.  Heart sounds were regular without murmurs or gallops.  Abdomen was soft with active bowel sounds. There is no clubbing cyanosis or edema. Skin Warm and dry    EKG  Procedure date:  01/30/2010  Findings:      sinus with nl QT  Impression &  Recommendations:  Problem # 1:  ATRIAL FIBRILLATION (ICD-427.31)  stabel without recurrences;  on tikosyn, QT inbterval is WNL and will check mag and K levels;  Also will be in touch with [name] re stopping the tikosyn and then assessing the presence or absence of AF by way of a monitor  prior to making a decision about OAC  Some recs are that people with CHADSVAS of 3 should remain on OAC regardless of monitoring results. Her updated medication list for this problem includes:    Coumadin 4 Mg Tabs (Warfarin sodium) .[name]... 1 tab once daily as directed    Tikosyn 250 Mcg Caps (Dofetilide) .[name]... Take 1 tab q12 hours    Coumadin 1 Mg Tabs (Warfarin sodium) .[name]... Take by mouth as directed  Problem # 2:  AFTERCARE, LONG-TERM USE, ANTICOAGULANTS (ICD-V58.61) stable   Problem # 3:  CARDIOMYOPATHY, SECONDARY RATE RELATED (ICD-425.9)  Resolved Her updated medication list for this problem includes:    Coumadin 4 Mg Tabs (Warfarin sodium) .[name]... 1 tab once daily as directed    Tikosyn 250 Mcg Caps (Dofetilide) .[name]... Take 1 tab q12 hours    Coumadin 1 Mg Tabs (Warfarin sodium) .[name]... Take by mouth as directed  Other Orders: TLB-BMP (Basic Metabolic Panel-BMET) (80048-METABOL) TLB-Magnesium (Mg) (83735-MG)  Patient Instructions: 1)  Your physician recommends that you have labs today: Potassium and Magnesium.  2)  Your physician recommends that you continue on your current medications as directed. Please refer to the Current Medication list given to you today. 3)  Your physician wants you to follow-up in: 6 months.   You will receive a reminder letter in the mail two months in advance. If you don't receive a letter, please call our office to schedule the follow-up appointment.

## 2010-04-23 NOTE — Miscellaneous (Signed)
Summary: [facility]  Clinical Lists Changes  Medications: Added new medication of MOVIPREP 100 GM  SOLR (PEG-KCL-NACL-NASULF-NA ASC-C) As per prep instructions. - Signed Rx of MOVIPREP 100 GM  SOLR (PEG-KCL-NACL-NASULF-NA ASC-C) As per prep instructions.;  #1 x 0;  Signed;  Entered by: Ezra Sites RN;  Authorized by: Louis Meckel MD;  Method used: Electronically to [facility] [address], [place], [place], [place]  [address], Ph: [phone], Fax: [phone] Observations: Added new observation of ALLERGY REV: Done (10/15/2009 8:32)    Prescriptions: MOVIPREP 100 GM  SOLR (PEG-KCL-NACL-NASULF-NA ASC-C) As per prep instructions.  #1 x 0   Entered by:   Ezra Sites RN   Authorized by:   Louis Meckel MD   Signed by:   Ezra Sites RN on 10/15/2009   Method used:   Electronically to        [facility] [address]ail)       [address]       [place], [place]  [address]       Ph: [phone]       Fax: [phone]   RxID:   [id number]

## 2010-04-23 NOTE — Letter (Signed)
Summary: New Patient letter  [facility]  [address] [place], [place] [address]   Phone: [phone]  Fax: [phone]       08/29/2009 MRN: [id number]  [name] [address] [place], [place]  [address]  Dear Ms. Heyden,  Welcome to the Gastroenterology Division at [facility].    You are scheduled to see Dr. Arlyce Dice on 10/14/2009 at 10:45AM on the 3rd floor at [facility], [address]  We ask that you try to arrive at our office 15 minutes prior to your appointment time to allow for check-in.  We would like you to complete the enclosed self-administered evaluation form prior to your visit and bring it with you on the day of your appointment.  We will review it with you.  Also, please bring a complete list of all your medications or, if you prefer, bring the medication bottles and we will list them.  Please bring your insurance card so that we may make a copy of it.  If your insurance requires a referral to see a specialist, please bring your referral form from your primary care physician.  Co-payments are due at the time of your visit and may be paid by cash, check or credit card.     Your office visit will consist of a consult with your physician (includes a physical exam), any laboratory testing he/she may order, scheduling of any necessary diagnostic testing (e.g. x-ray, ultrasound, CT-scan), and scheduling of a procedure (e.g. Endoscopy, Colonoscopy) if required.  Please allow enough time on your schedule to allow for any/all of these possibilities.    If you cannot keep your appointment, please call [phone] to cancel or reschedule prior to your appointment date.  This allows [name] the opportunity to schedule an appointment for another patient in need of care.  If you do not cancel or reschedule by 5 p.m. the business day prior to your appointment date, you will be charged a $50.00 late cancellation/no-show fee.    Thank you for choosing  [facility] for your medical needs.  We appreciate the opportunity to care for you.  Please visit [name] at our website  to learn more about our practice.                     Sincerely,                                                             The Gastroenterology Division

## 2010-04-23 NOTE — Letter (Signed)
Summary: Anticoagulation Modification Letter  [facility]  [address] [place], [place] [address]   Phone: [phone]  Fax: [phone]    October 02, 2009  Re:    [name] DOB:    07-01-1932 MRN:    [id number]    Dear Dr.Klein,  We have scheduled the above patient for a Colonoscopy with Dr.Kaplan. Our records show that she is on anticoagulation therapy. Please advise as to how long the patient may come off their therapy of Coumadin prior to the scheduled procedure on 10-28-09.   Please  route the completed form to [employer] LPN.  Thank you for your help with this matter.  Sincerely,  Laureen Ochs LPN   [facility] Recommendation:  Hold Plavix 7 days prior ________________  **Hold Coumadin 5 days prior ____________  Other ______________________________     Appended Document: Anticoagulation Modification Letter Per Dr Graciela Husbands, okay to hold Coumadin for 5 days before procedure.

## 2010-04-23 NOTE — Progress Notes (Signed)
  Phone Note Call from Patient   Caller: Patient Call For: Judith Part MD Summary of Call: Micah Flesher to the ER with right sided pain, Had several tests done and it was decided she has a polyp. She did have 24 hours of rectal bleeding . This has resolved. Patient wants a referral to GI, has been to [facility] before, doesn't remember who she saw. She will see anyone . Initial call taken by: Mills Koller,  August 27, 2009 2:40 PM  Follow-up for Phone Call        will do ref to send to [facility] Follow-up by: Judith Part MD,  August 27, 2009 8:15 PM  New Problems: ABDOMINAL PAIN (ICD-789.00)   New Problems: ABDOMINAL PAIN (ICD-789.00)

## 2010-04-23 NOTE — Assessment & Plan Note (Signed)
Summary: abd pain...as.    History of Present Illness Visit Type: consult  Primary GI MD: Melvia Heaps MD [facility] Primary Provider: Roxy Manns, MD Requesting Provider: Roxy Manns, MD Chief Complaint: change in bowel habits, hemorrhoids, rectal bleeding, and right lower abd pain  History of Present Illness:   [name] 77 year old white female referred at the request of Dr. Milinda Antis for evaluation of abdominal pain.  For the past month she has been complaining of right lower quadrant pain.  Pain is spontaneous and worsened with bending or certain positions.  It is also relieved when she lies still or changes position.  It is unrelated to bowel movements or eating.  She underwent a GYN exam which was normal.  A CT scan, which I reviewed, did not demonstrate any abnormalities in the abdomen.  Mild fatty infiltration of the liver was seen.  Pain originally was in both lower quadrants but now resides exclusively in the right lower quadrant.  It may radiate around to her back.  She denies dysuria or urinary frequency.  She has had 2 discrete episodes of bleeding consistent of small amounts of bright red blood mixed with her stools.  She has also noted some flattening of the stools.  Colonoscopy in 2008 demonstrated 2 adenomatous polyps.  The patient is status post ablation therapy.  She has atrial fibrillation and rate related cardiomyopathy.  She is on Coumadin.   GI Review of Systems    Reports abdominal pain.     Location of  Abdominal pain: RLQ.    Denies acid reflux, belching, bloating, chest pain, dysphagia with liquids, dysphagia with solids, heartburn, loss of appetite, nausea, vomiting, vomiting blood, weight loss, and  weight gain.      Reports change in bowel habits, hemorrhoids, and  rectal bleeding.      Current Medications (verified): 1)  Coumadin 4 Mg Tabs (Warfarin Sodium) .[name].. 1 Tab Once Daily As Directed 2)  Multivitamins   Tabs (Multiple Vitamin) .... Take One By Mouth Daily 3)   Tikosyn 250 Mcg Caps (Dofetilide) .... Take 1 Tab Q12 Hours  Allergies (verified): 1)  ! Penicillin 2)  ! * Statins 3)  ! Amiodarone Hcl  Past History:  Past Medical History: Reviewed history from 09/18/2009 and no changes required. 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) URINARY INCONTINENCE (ICD-788.30) NECK PAIN (ICD-723.1) INSOMNIA (ICD-780.52) HYPERLIPIDEMIA (ICD-272.4) DEPRESSION (ICD-311) ASTHMA (ICD-493.90) ALLERGIC RHINITIS (ICD-477.9) RECTAL BLEEDING (ICD-569.3) HYPOTHYROIDISM NOS (ICD-244.9) carotid stenosis mild (hosp 3/11)- consult by vasc / Dr Early cardiol vasc - Dr Arbie Cookey- in hosp  Past Surgical History: Mitral valve replacement Pilonidal cyst removal Colonoscopy (2001) Exercise stress test (04/2005) CT of abd and pelvis- neg (12/2005) Carotid doppler- no stenosis (12/2005) Gross hematuria on anticoagulants, neg urol work up (12/2005) Admit- syncope, ? bradycardia,  cardiac and neuro work up Colonoscopy- polyps (10/2006) TIA admit 3/11 - nl imaging and echo , with mod carotid stenosis (had vascular consult) Appendectomy  Family History: Father: ETOH, bladder and lung cancer- smoker Mother: HTN Siblings: none No FH of Colon Cancer:  Social History: Retired Widowed Six Childern Patient has never smoked.  Alcohol Use - no Daily Caffeine Use: 2 daily  Illicit Drug  Use - no Drug Use:  no  Review of Systems       The patient complains of  hearing problems.  The patient denies allergy/sinus, anemia, anxiety-new, arthritis/joint pain, back pain, blood in urine, breast changes/lumps, change in vision, confusion, cough, coughing up blood, depression-new, fainting, fatigue, fever, headaches-new, heart murmur, heart rhythm changes, itching, menstrual pain, muscle pains/cramps, night sweats, nosebleeds, pregnancy symptoms, shortness of breath, skin rash, sleeping problems, sore throat, swelling of feet/legs, swollen lymph glands, thirst - excessive , urination - excessive , urination changes/pain, urine leakage, vision changes, and voice change.         All other systems were reviewed and were negative   Vital Signs:  Patient profile:   77 year old female Height:      65.5 inches Weight:      167 pounds BMI:     27.47 BSA:     1.84 Pulse rate:   64 / minute Pulse rhythm:   regular BP sitting:   106 / 74  (left arm) Cuff size:   regular  Vitals Entered By: Ok Anis CMA (September 18, 2009 2:33 PM)  Physical Exam  Additional Exam:  N. physical exam she is a well-developed well-nourished female  skin: anicter  HEENT: normocephalic; PEERLA; no nasal or orpharyngeal abnormalities neck: supple nodes: no cervical adenopathy chest: clear cor:  no murmurs, gallops or rubs abd:  bowel sounds normoactive; no abdominal masses, organomegaly; there is a mild tenderness to deep palpation in the right lower quadrant in proximity to the groin.  Tenderness is decreased with abdominal muscle wall flexion. rectal: no masses; stool heme negative ext: no cyanosis, clubbing, or edema skeletal: no gross skeletal abnormalities neuro: alert, oriented x 3; no focal abnormalities    Impression & Recommendations:  Problem # 1:  ABDOMINAL PAIN (ICD-789.00) I am suspicious that her abdominal pain is actually due to the spinal nerve root pain rather than visceral pain, per se.  There is no evidence of an active and abdominal process by CT scan.   Ischemic colitis is not likely.  Recommendations #1 trial of ultram and local heat.  Will avoid NSAIDS because of interference with her coumadin.  Problem # 2:  COLONIC POLYPS, ADENOMATOUS, HX OF (ICD-V12.72) Plan followup colonoscopy in August, 2011  Problem # 3:  RECTAL BLEEDING (ICD-569.3) This is probably hemorrhoidal bleeding.  Plan no further immediate workup.  Problem # 4:  ATRIAL FIBRILLATION (ICD-427.31) Assessment: Comment Only  Patient Instructions: 1)  Copy sent to : Roxy Manns, MD 2)  Keep your office visit with Dr. Arlyce Dice on 10/22/2009 at 9:00am. 3)  The medication list was reviewed and reconciled.  All changed / newly prescribed medications were explained.  A complete medication list was provided to the patient / caregiver. Prescriptions: CLINORIL 200 MG TABS (SULINDAC) take one tab twice a day for 14 days then as needed  #30 x 1   Entered and Authorized by:   Louis Meckel MD   Signed by:   Louis Meckel MD on 09/18/2009   Method used:   Electronically to        [facility]  [address] (retail)       [address], [address]       [place], [place]  [address]       Ph: [phone]       Fax: [phone]   RxID:   [id number]

## 2010-04-23 NOTE — Progress Notes (Signed)
   Phone Note Outgoing Call   Call placed by: rhonda Call placed to: Patient Details for Reason: need to recheck K+ in 4 weeks Summary of Call: left message for callback. Initial call taken by: Claris Gladden RN,  February 03, 2010 1:03 PM  Follow-up for Phone Call        pt would like to schedule 12/12 @ 9:00 am . pt request to have CRP tested for inflamation  Follow-up by: Claris Gladden RN,  February 03, 2010 2:28 PM

## 2010-04-23 NOTE — Assessment & Plan Note (Signed)
Summary: 6 month rov/sl      Allergies Added:   CC:  6 month rov.  History of Present Illness: [name] is seen following an atrial fibrillation ablation undertaken at [facility] in March2010. The postprocedural interval was complicated by recurrent atrial arrhythmias prompting   initiation of Tikosyn following cardioversion for atrial tachycardia.  She has been maintained on the Tikosyn. She has had no significant arrhythmias.  She was recently hospitalized because of facial tingling. She underwent extensive evaluation including CT MR murmur which showed a clear neurological issue. She was and has been maintained on Coumadin.    Current Medications (verified): 1)  Coumadin 4 Mg Tabs (Warfarin Sodium) .[name].. 1 Tab Once Daily As Directed 2)  Multivitamins   Tabs (Multiple Vitamin) .... Take One By Mouth Daily 3)  Tikosyn 250 Mcg Caps (Dofetilide) .... Take 1 Tab Q12 Hours  Allergies (verified): 1)  ! Penicillin 2)  ! * Statins 3)  ! Amiodarone Hcl  Past History:  Past Medical History: Last updated: 06/16/2009 Current Problems:  MITRAL VALVE PROLAPSE, HX OF (ICD-V12.50) ATRIAL FIBRILLATION (ICD-427.31) AFTERCARE, LONG-TERM USE, ANTICOAGULANTS (ICD-V58.61) OBESITY (ICD-278.00) ENCOUNTER FOR THERAPEUTIC DRUG MONITORING (ICD-V58.83) URI (ICD-465.9) GANGLION CYST (ICD-727.43) ADENOMATOUS COLONIC POLYP (ICD-211.3) MENOPAUSE-RELATED VASOMOTOR SYMPTOMS (ICD-627.2) URINARY INCONTINENCE (ICD-788.30) NECK PAIN (ICD-723.1) INSOMNIA (ICD-780.52) HYPERLIPIDEMIA (ICD-272.4) DEPRESSION (ICD-311) ASTHMA (ICD-493.90) ALLERGIC RHINITIS (ICD-477.9) RECTAL BLEEDING (ICD-569.3) HYPOTHYROIDISM NOS (ICD-244.9) TIA 3/11 carotid stenosis mild (hosp 3/11)- consult by vasc / Dr Early   cardiol vasc - Dr Arbie Cookey- in hosp  Past Surgical History: Last updated: 06/16/2009 Mitral valve replacement Pilonidal cyst removal Colonoscopy (2001) Exercise stress test (04/2005) CT of abd and pelvis- neg  (12/2005) Carotid doppler- no stenosis (12/2005) Gross hematuria on anticoagulants, neg urol work up (12/2005) Admit- syncope, ? bradycardia,  cardiac and neuro work up Colonoscopy- polyps (10/2006) TIA admit 3/11 - nl imaging and echo , with mod carotid stenosis (had vascular consult)  Family History: Last updated: 04/27/2007 Father: ETOH, bladder and lung cancer- smoker Mother: HTN Siblings: none  Social History: Last updated: 04/27/2007 Marital Status: widowed Children: 6 Occupation:   Vital Signs:  Patient profile:   77 year old female Height:      65.5 inches Weight:      167 pounds BMI:     27.47 Pulse rate:   71 / minute Pulse rhythm:   regular BP sitting:   108 / 64  (left arm) Cuff size:   regular  Vitals Entered By: Judithe Modest CMA (July 16, 2009 2:17 PM)  Physical Exam  General:  The patient was alert and oriented in no acute distress. HEENT Normal.  Neck veins were flat, carotids were brisk.  Lungs were clear.  Heart sounds were regular without murmurs or gallops.  Abdomen was soft with active bowel sounds. There is no clubbing cyanosis or edema. Skin Warm and dry    EKG  Procedure date:  07/16/2009  Findings:      probable junctional rhythm at a rate of 74 Intervals 0.12/0.10/23 9 Axis is -13 An isolated PAC has a PR interval of 200 ms suggesting that the very short PR interval is indeed consistent with a junctional rhythm with perhaps a high junctional focus resulting in the maintenance of AV synchrony  Impression & Recommendations:  Problem # 1:  ATRIAL FIBRILLATION (ICD-427.31) She will be maintained on Coumadin. We'll check her potassium and magnesium levels today. Her QT interval for her dofetilide is normal Her updated medication list for this problem includes:  Coumadin 4 Mg Tabs (Warfarin sodium) .[name]... 1 tab once daily as directed    Tikosyn 250 Mcg Caps (Dofetilide) .[name]... Take 1 tab q12 hours  Orders: EKG w/ Interpretation  (93000) TLB-BMP (Basic Metabolic Panel-BMET) (80048-METABOL) TLB-Magnesium (Mg) (83735-MG)  Problem # 2:  JUNCTIONAL RHYTHM (ICD-427.81) this was reviewed extensively with the patient. She is asymptomatic likely because of the relatively long PR interval Her updated medication list for this problem includes:    Coumadin 4 Mg Tabs (Warfarin sodium) .[name]... 1 tab once daily as directed    Tikosyn 250 Mcg Caps (Dofetilide) .[name]... 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 1 tab once daily as directed    Tikosyn 250 Mcg Caps (Dofetilide) .[name]... Take 1 tab q12 hours  Patient Instructions: 1)  Your physician wants you to follow-up in: 6 months with Dr Graciela Husbands.  You will receive a reminder letter in the mail two months in advance. If you don't receive a letter, please call our office to schedule the follow-up appointment.

## 2010-04-23 NOTE — Progress Notes (Signed)
Summary: Medical records from [facility]  Phone Note Outgoing Call   Call placed by: Mills Koller Call placed to: Patient Summary of Call: patient went to the E.R at [place]. She was having lower abd pain. E.R had done several labs while she was there. She was refered to GYN, has appt tomorrow. No missed doses, I had her increase her dose today only  by 2.5mg . Initial call taken by: Mills Koller,  Aug 07, 2009 2:50 PM  Follow-up for Phone Call        thanks for the update  please send for the ER note when ready- if able  I will also route to Rena to do that - thanks Follow-up by: Judith Part MD,  Aug 07, 2009 2:52 PM  Additional Follow-up for Phone Call Additional follow up Details #1::        Spoke with Vernona Rieger at [facility] med records. She will fax records this Huntley Dec LPN  Aug 08, 2009 10:10 AM   Called and left message that we have not received ER  note requested and please fax ASAP.Lewanda Rife LPN  Aug 08, 2009 3:55 PM   spoke with Marchelle Folks @ [facility] she will fax over office notes. DeShannon Smith CMA Duncan Dull)  Aug 08, 2009 4:10 PM   PT was faxed from [facility]. I called and spoke with Angelique Blonder in med records and she did some checking pt was seen at [facility] urgent care. I called [facility] Urgent care and requested records. They will fax over.Lewanda Rife LPN  Aug 08, 2009 4:35 PM   [facility] In report came in and is on your shelf in the in box. Thank you.Lewanda Rife LPN  Aug 08, 2009 4:42 PM     Additional Follow-up for Phone Call Additional follow up Details #2::    I reviewed them and signed to be scanned   Follow-up by: Judith Part MD,  Aug 09, 2009 8:04 AM

## 2010-04-23 NOTE — Letter (Signed)
Summary: Patient Notice- Polyp Results  [facility]  [address] [name], [place] [address]   Phone: [phone]  Fax: [phone]        October 30, 2009 MRN: [id number]    MALVA DIESING [address] [place], [place]  [address]    Dear Ms. Hipple,  I am pleased to inform you that the colon polyp(s) removed during your recent colonoscopy was (were) found to be benign (no cancer detected) upon pathologic examination.  I recommend you have a repeat colonoscopy examination in _5 years to look for recurrent polyps, as having colon polyps increases your risk for having recurrent polyps or even colon cancer in the future.  Should you develop new or worsening symptoms of abdominal pain, bowel habit changes or bleeding from the rectum or bowels, please schedule an evaluation with either your primary care physician or with me.  Additional information/recommendations:  __ No further action with gastroenterology is needed at this time. Please      follow-up with your primary care physician for your other healthcare      needs.  __ Please call [phone] to schedule a return visit to review your      situation.  __ Please keep your follow-up visit as already scheduled.  _x_ Continue treatment plan as outlined the day of your exam.  Please call us if you are having persistent problems or have questions about your condition that have not been fully answered at this time.  Sincerely,  Louis Meckel MD  This letter has been electronically signed by your physician.  Appended Document: Patient Notice- Polyp Results letter mailed

## 2010-04-24 NOTE — Progress Notes (Signed)
Summary: PT HAS MED QUESTION    Phone Note Call from Patient   Caller: Patient [phone] Summary of Call: PT ON A DIET WITH A METOBOLIC REGIMINE WHICH INCLUDES CHRONIUM-IS THIS SOMETHING SHE CAN TAKE?PLS ADVISE Initial call taken by: Glynda Jaeger,  April 02, 2010 8:28 AM  Follow-up for Phone Call        initial investigation does not show an interaction but will confirm w/Sally when she gets out of her meeting. pt adv and is agreeable. Claris Gladden, RN, BSN Follow-up by: Claris Gladden RN,  April 02, 2010 9:20 AM  Additional Follow-up for Phone Call Additional follow up Details #1::        per Kennon Rounds, she is not aware of any contraindications of chromium and Tikosyn and coumadin. left msg to adv pt.  Additional Follow-up by: Claris Gladden RN,  April 02, 2010 11:20 AM

## 2010-04-24 NOTE — Progress Notes (Signed)
Summary: BMP results faxed to Dr Graciela Husbands  ---- Converted from flag ---- ---- 03/03/2010 2:21 PM, Mills Koller wrote: Please fax Dr Graciela Husbands [facility] results, T ------------------------------ I faxed thru EMR the BMP results to Dr. [name] Salvia as requested.Lewanda Rife LPN  March 05, 2010 3:12 PM

## 2010-04-24 NOTE — Progress Notes (Signed)
   Phone Note Outgoing Call   Call placed by: rhonda Call placed to: Patient Details for Reason: Coming off Tikosyn and Coumadin Summary of Call: patient would like to come off of Tikosyn and Coumadin and would like to get approval of Dr. Graciela Husbands and process to dc meds.  Initial call taken by: Claris Gladden RN,  March 05, 2010 11:32 AM  Follow-up for Phone Call        ok to come off tikosyn stay on coumadin per Dr. Graciela Husbands.  Follow-up by: Claris Gladden RN,  March 06, 2010 5:29 PM  Additional Follow-up for Phone Call Additional follow up Details #1::        adv pt ok to come off tiksoyn but to stay on coumadin because of afib hx. pt states understanding.  Additional Follow-up by: Claris Gladden RN,  March 06, 2010 5:51 PM

## 2010-04-28 ENCOUNTER — Encounter: Payer: Self-pay | Admitting: Family Medicine

## 2010-04-28 ENCOUNTER — Ambulatory Visit (INDEPENDENT_AMBULATORY_CARE_PROVIDER_SITE_OTHER): Payer: Medicare Other

## 2010-04-28 DIAGNOSIS — Z7901 Long term (current) use of anticoagulants: Secondary | ICD-10-CM

## 2010-04-28 DIAGNOSIS — I4891 Unspecified atrial fibrillation: Secondary | ICD-10-CM

## 2010-04-28 DIAGNOSIS — Z5181 Encounter for therapeutic drug level monitoring: Secondary | ICD-10-CM

## 2010-04-28 LAB — CONVERTED CEMR LAB: Prothrombin Time: 17.5 s

## 2010-05-12 ENCOUNTER — Ambulatory Visit: Payer: Medicare Other

## 2010-05-13 ENCOUNTER — Encounter: Payer: Self-pay | Admitting: Family Medicine

## 2010-05-13 ENCOUNTER — Ambulatory Visit (INDEPENDENT_AMBULATORY_CARE_PROVIDER_SITE_OTHER): Payer: Medicare Other

## 2010-05-13 DIAGNOSIS — I4891 Unspecified atrial fibrillation: Secondary | ICD-10-CM

## 2010-05-13 DIAGNOSIS — Z5181 Encounter for therapeutic drug level monitoring: Secondary | ICD-10-CM

## 2010-05-13 DIAGNOSIS — Z7901 Long term (current) use of anticoagulants: Secondary | ICD-10-CM

## 2010-05-13 LAB — CONVERTED CEMR LAB
INR: 2
Prothrombin Time: 23.9 s

## 2010-05-20 NOTE — Medication Information (Signed)
Summary: protime/tw   PCP: Roxy Manns, MD Indication 1: Atrial fibrillation PT 23.9 INR RANGE 2.0-3.0           Allergies: 1)  ! Penicillin 2)  ! * Statins 3)  ! Amiodarone Hcl  Anticoagulation Management History:      Positive risk factors for bleeding include an age of 74 years or older.  The bleeding index is 'intermediate risk'.  Positive CHADS2 values include Age > 75 years old.  Her last INR was 1.5 and today's INR is 2.0.  Prothrombin time is 23.9.    Anticoagulation Management Assessment/Plan:      The patient's current anticoagulation dose is Coumadin 4 mg tabs: 1 tab once daily as directed, Coumadin 1 mg tabs: take by mouth as directed.  The next INR is due 4 weeks.         ANTICOAGULATION RECORD PREVIOUS REGIMEN & LAB RESULTS Anticoagulation Diagnosis:  Atrial fibrillation on  03/19/2009 Previous INR Goal Range:  2.0-3.0 on  03/19/2009 Previous INR:  1.5 on  04/28/2010 Previous Coumadin Dose(mg):  5mg  daily on  04/28/2010 Previous Regimen:  5mg  daily, 7.5 mg mon on  04/28/2010 Previous Coagulation Comments:  . on  07/01/2009  NEW REGIMEN & LAB RESULTS Current INR: 2.0 Regimen: 5mg  qd, 7.5mg  Sun  Provider: Deshanti Adcox      Repeat testing in: 4 weeks MEDICATIONS COUMADIN 4 MG TABS (WARFARIN SODIUM) 1 tab once daily as directed MULTIVITAMINS   TABS (MULTIPLE VITAMIN) Take one by mouth daily COUMADIN 1 MG TABS (WARFARIN SODIUM) take by mouth as directed  Dose has been reviewed with patient or caretaker during this visit.  Reviewed by: Allison Quarry  Anticoagulation Visit Questionnaire      Coumadin dose missed/changed:  No      Abnormal Bleeding Symptoms:  No Any diet changes including alcohol intake, vegetables or greens since the last visit:  No Any illnesses or hospitalizations since the last visit:  No Any signs of clotting since the last visit (including chest discomfort, dizziness, shortness of breath, arm tingling, slurred speech, swelling or redness in  leg):  No   Laboratory Results   Blood Tests   Date/Time Recieved: May 13, 2010 1:01 PM  Date/Time Reported: May 13, 2010 1:01 PM   PT: 23.9 s   (Normal Range: 10.6-13.4)  INR: 2.0   (Normal Range: 0.88-1.12   Therap INR: 2.0-3.5)

## 2010-06-11 ENCOUNTER — Ambulatory Visit: Payer: Medicare Other

## 2010-06-12 ENCOUNTER — Ambulatory Visit (INDEPENDENT_AMBULATORY_CARE_PROVIDER_SITE_OTHER): Payer: Medicare Other | Admitting: Internal Medicine

## 2010-06-12 ENCOUNTER — Telehealth: Payer: Self-pay | Admitting: Radiology

## 2010-06-12 DIAGNOSIS — Z7901 Long term (current) use of anticoagulants: Secondary | ICD-10-CM

## 2010-06-12 DIAGNOSIS — I4891 Unspecified atrial fibrillation: Secondary | ICD-10-CM

## 2010-06-12 DIAGNOSIS — Z5181 Encounter for therapeutic drug level monitoring: Secondary | ICD-10-CM

## 2010-06-12 NOTE — Telephone Encounter (Signed)
Order for INR

## 2010-06-12 NOTE — Patient Instructions (Signed)
No changes

## 2010-06-15 LAB — PROTIME-INR
INR: 1.82 — ABNORMAL HIGH (ref 0.00–1.49)
Prothrombin Time: 18.8 seconds — ABNORMAL HIGH (ref 11.6–15.2)
Prothrombin Time: 20.9 seconds — ABNORMAL HIGH (ref 11.6–15.2)

## 2010-06-15 LAB — LIPID PANEL
Cholesterol: 268 mg/dL — ABNORMAL HIGH (ref 0–200)
LDL Cholesterol: 204 mg/dL — ABNORMAL HIGH (ref 0–99)
Triglycerides: 113 mg/dL (ref <150)
VLDL: 23 mg/dL (ref 0–40)

## 2010-06-15 LAB — DIFFERENTIAL
Eosinophils Absolute: 0.1 10*3/uL (ref 0.0–0.7)
Eosinophils Relative: 3 % (ref 0–5)
Lymphs Abs: 1.2 10*3/uL (ref 0.7–4.0)
Monocytes Absolute: 0.4 10*3/uL (ref 0.1–1.0)

## 2010-06-15 LAB — URINALYSIS, ROUTINE W REFLEX MICROSCOPIC
Glucose, UA: NEGATIVE mg/dL
Nitrite: NEGATIVE
Protein, ur: NEGATIVE mg/dL
pH: 7 (ref 5.0–8.0)

## 2010-06-15 LAB — CBC
HCT: 36.9 % (ref 36.0–46.0)
Hemoglobin: 12.9 g/dL (ref 12.0–15.0)
MCV: 86.3 fL (ref 78.0–100.0)
Platelets: 120 10*3/uL — ABNORMAL LOW (ref 150–400)
RDW: 13.8 % (ref 11.5–15.5)
WBC: 3.7 10*3/uL — ABNORMAL LOW (ref 4.0–10.5)

## 2010-06-15 LAB — BASIC METABOLIC PANEL
BUN: 12 mg/dL (ref 6–23)
Chloride: 106 mEq/L (ref 96–112)
Glucose, Bld: 114 mg/dL — ABNORMAL HIGH (ref 70–99)
Potassium: 3.8 mEq/L (ref 3.5–5.1)

## 2010-06-15 LAB — URINE MICROSCOPIC-ADD ON

## 2010-06-15 LAB — POCT CARDIAC MARKERS: CKMB, poc: 1 ng/mL (ref 1.0–8.0)

## 2010-07-10 ENCOUNTER — Ambulatory Visit (INDEPENDENT_AMBULATORY_CARE_PROVIDER_SITE_OTHER): Payer: Medicare Other | Admitting: Internal Medicine

## 2010-07-10 DIAGNOSIS — I4891 Unspecified atrial fibrillation: Secondary | ICD-10-CM

## 2010-07-10 DIAGNOSIS — Z7901 Long term (current) use of anticoagulants: Secondary | ICD-10-CM

## 2010-07-10 DIAGNOSIS — Z5181 Encounter for therapeutic drug level monitoring: Secondary | ICD-10-CM

## 2010-07-10 LAB — POCT INR: INR: 1.9

## 2010-08-05 NOTE — Procedures (Signed)
 HEALTHCARE                              EXERCISE TREADMILL   NAME:[name], [name]                        MRN:          [id number]  DATE:08/24/2007                            DOB:          1932/10/26    Ms. Theys is a 77 year old white female that has a history of atrial  fibrillation, for which she has undergone cardioversion, and she has  also had a procedure in [place], I believe for some type of right  atrial ablation procedure.  She has had intolerance to amiodarone and  most recently to metoprolol, which was stopped.  A stress test was  ordered several months ago and she was just able to schedule it today.   We originally started out in a Bruce protocol and baseline EKG difficult  to discern if it is a junctional rhythm or atrial flutter at 103 beats  per minute.  Her heart rate jumped up immediately at 136 and, within one  and a half minutes of exercise, her heart rate was up to 171.  She said  she never walks on an incline, but she can walk on a flat surface on her  treadmill for 30 minutes.  She did go into atrial fibrillation, but in  recovery, her heart rate slowed down to what looks like another  junctional rhythm.  She then wanted to try walking on a flat surface, so  we resumed the stress test and her heart rate, in fact, did not jump up  quite as quickly.  She did have an episode about three minutes into  exercise when she went into atrial fibrillation at 137 beats per minute,  but she came out of this quickly on her own, without our slowing the  treadmill down and her heart rate was maintained at around 110 beats per  minute.  The stress test was stopped after five minutes of exercise and  she will see Dr. Graciela Husbands back to discuss further treatment.   Her heart rate does jump up with any incline, quickly, and seems to be  having bouts of rapid atrial fibrillation with very little exercise.      Jacolyn Reedy, PA-C  Electronically  Signed      [name] Salvia, MD, [facility]  Electronically Signed   ML/MedQ  DD: 08/24/2007  DT: 08/24/2007  Job #: [id number]

## 2010-08-05 NOTE — Op Note (Signed)
[name], [name]                 ACCOUNT NO.:  [id number]   MEDICAL RECORD NO.:  [id number]          PATIENT TYPE:  AMB   LOCATION:  DAY                          FACILITY:  [facility]   PHYSICIAN:  Martina Sinner, MD DATE OF BIRTH:  December 27, 1932   DATE OF PROCEDURE:  07/26/2007  DATE OF DISCHARGE:                               OPERATIVE REPORT   PREOPERATIVE DIAGNOSIS:  Stress urinary incontinence.   POSTOPERATIVE DIAGNOSIS:  Stress urinary incontinence.   SURGERY:  Cystoscopy, transurethral collagen injection therapy.   Mrs. Arnold Long has stress incontinence and multiple comorbidities.  Her  Coumadin was stopped prior to the procedure.  The ACMI scope was used  for the examination and treatment.  Bladder mucosa and trigone were  normal.  There was no stitch, foreign body or carcinoma.  I injected two  syringes of collagen at 5 o'clock with excellent coaptation.  I was very  pleased with the technical aspects of the procedure.  I passed a 14-  French catheter and partially emptied her bladder.   The patient will be sent to recovery room and hopefully this will help  reach her treatment goal.           ______________________________  Martina Sinner, MD  Electronically Signed     SAM/MEDQ  D:  07/26/2007  T:  07/26/2007  Job:  [id number]

## 2010-08-05 NOTE — Assessment & Plan Note (Signed)
[facility]                         ELECTROPHYSIOLOGY OFFICE NOTE   NAME:Roel, MALLISSA LORENZEN                        MRN:          [id number]  DATE:01/20/2007                            DOB:          [birth year]    Ms. Piercey has called because she was having recurrent problems with  exercise intolerance.  She has paroxysmal atrial fibrillation.  She  thinks maybe she went back into atrial fibrillation.  This occurred in  the context of having her choosing to stop her metoprolol, a decision  with which I had concurred.  She felt that there was head fullness, that  there was increasing exercise intolerance and fatigue.   These symptoms seem to have abated since the resumption of her  metoprolol.   OTHER MEDICATIONS:  Coumadin.   Her metoprolol dose is 25 a day.   EXAMINATION:  Her blood pressure today is 113/78, her pulse was 88,  which is unusual.  LUNGS:  Clear.  HEART SOUNDS:  Regular with occasional skips.  EXTREMITIES:  Without edema.   IMPRESSION:  1. Paroxysmal atrial fibrillation, currently in a regular rhythm.  2. Propensity to sinus bradycardia.   I concurred with Ms. Standen's decision to resume her metoprolol.  I am a  little bit struck by, and she has left the office, that her heart rate  is so much faster than the bradycardia that we have seen here to date  and it prompts the question as to whether I missed the fact that she was  in an atrial flutter with regular conduction.   I will plan to have her come back for an electrocardiogram to make sure  I did not miss this.   Otherwise, I will see her as previously scheduled.     [name] Salvia, MD, [facility]  Electronically Signed    SCK/MedQ  DD: 01/20/2007  DT: 01/20/2007  Job #: [id number]

## 2010-08-05 NOTE — Assessment & Plan Note (Signed)
[facility]                         GASTROENTEROLOGY OFFICE NOTE   NAME:[name]                        MRN:          [id number]  DATE:09/30/2006                            DOB:          [birth year]    REASON FOR CONSULTATION:  Rectal bleeding.   [name] is a pleasant 77 year old white female referred through the  courtesy of Dr.Tower for evaluation.  For the past six months, she has  been suffering from intermittent bright red blood per rectum, consisting  of bright red blood mixed with her stools.  She has also noted change of  bowel habits, consisting episodes of diarrhea and constipation.  She is  without abdominal pain.  She takes Coumadin because of a history of  atrial fibrillation.  She has gained 25 pounds for the past six months,  which she attributes to her medication.  She also complains of post  prandial abdominal distention and bloating.  She is without abdominal  pain.   PAST MEDICAL HISTORY:  Pertinent for atrial fibrillation.  She had a TIA  in 2007.  She is status post hemorrhoidectomy, appendectomy, and heart  valve surgery.   FAMILY HISTORY:  Noncontributory.   Medications include metoprolol, Coumadin, and amiodarone.   She is allergic to PENICILLIN and STATINS.   She neither smokes nor drinks.  She is a widow and works as a Contractor.   REVIEW OF SYSTEMS:  Positive for excess urination, blood in the urine,  fatigue, and shortness of breath.   PHYSICAL EXAMINATION:  VITAL SIGNS:  Pulse 52, blood pressure 104/60.  Weight 197.  HEENT: EOMI. PERRLA. Sclerae are anicteric.  Conjunctivae are pink.  NECK:  Supple without thyromegaly, adenopathy or carotid bruits.  CHEST:  Clear to auscultation and percussion without adventitious  sounds.  CARDIAC:  Regular rhythm; normal S1 S2.  There are no murmurs, gallops  or rubs.  ABDOMEN:  Bowel sounds are normoactive.  Abdomen is soft, non-tender and  non-distended.  There are  no abdominal masses, tenderness, splenic  enlargement or hepatomegaly.  EXTREMITIES:  Full range of motion.  No cyanosis, clubbing or edema.  RECTAL:  Deferred.   On July 12, 2006, hemoglobin and hematocrit was 37.   IMPRESSION:  1. Limited rectal bleeding, rule out colonic bleeding.  Sources,      including polyps, arteriovenous malformations, hemorrhoids, and      neoplasm.  2. Nonspecific dyspepsia.  3. History of atrial fibrillation, on Coumadin.  4. History of heart valve surgery.   RECOMMENDATION:  1. Colonoscopy (patient will receive antibiotic prophylaxis)..  2. Repeat CBC.     Barbette Hair. Arlyce Dice, MD,FACG  Electronically Signed    RDK/MedQ  DD: 09/30/2006  DT: 09/30/2006  Job #: [id number]   cc:   Marne A. Milinda Antis, MD

## 2010-08-05 NOTE — Assessment & Plan Note (Signed)
[facility]                         ELECTROPHYSIOLOGY OFFICE NOTE   NAME:[name], [name]                        MRN:          [id number]  DATE:12/02/2006                            DOB:          [birth year]    [name] is seen.  She has a history of atrial fibrillation and a  rapid ventricular response, currently in sinus rhythm, holding despite  the absence of antiarrhythmic drugs.  She is feeling pretty well.  She  has had some fatigue.  This may be related to her drug.  I am not sure.   Her other medications include only Coumadin.   On examination, her weight is 195 pounds.  This is up 1 pound in the  last 4 months.  Stable in the last 6 months.  Up 10 pounds in the last  year.  Her blood pressure was 115/71.  Her pulse is 54.  LUNGS:  Clear.  Heart sounds were regular.  EXTREMITIES:  Without edema.  SKIN:  Warm and dry.   Electrocardiogram dated today demonstrated sinus rhythm at 62 with  intervals of 0.14/0.10/0.44.  The axis was leftward at -30 degrees.  There is an R prime in lead V1.   IMPRESSION:  1. Paroxysmal atrial fibrillation with a relatively rapid ventricular      response.  2. Propensity toward bradycardia.  3. Status post flutter ablation at the [place] [facility] [place].  4. Intolerant of amiodarone with elevated liver function tests and      hypothyroidism, now improved off drugs.  5. Fatigue, question related to chronotropic incompetence versus drug      versus etc.  6. Obesity.   [name] is quite concerned about her obesity.  We talked about a  variety of diets and the importance of exercise.  I have encouraged her  to think about decreasing carbohydrate intake.   She is interested in stopping her metoprolol; knowing that it does not  help particularly for her atrial fibrillation and that she is relatively  bradycardic, I think this is reasonable.  She may well need assessment  for chronotropic competence, as this has  been issue in the past as well.   We will plan to see her again in 6 months' time.  Hopefully, she will  have lost weight and be feeling better at that time.  She may well  develop atrial fibrillation intercurrently, and we will have to deal  with that at that time.     [name] Salvia, MD, [facility]  Electronically Signed    SCK/MedQ  DD: 12/02/2006  DT: 12/03/2006  Job #: [id number]   cc:   Mila Merry

## 2010-08-05 NOTE — Letter (Signed)
January 30, 2008    Val Riles, MD  [facility] [address]  [place], [place] [address]   RE:  [name], [name]  MRN:  [id number]  /  DOB:  1932-12-30   Dear Vista Deck,   I hope this letter finds you and your family well and I hope and pray  that you will have a good Thanksgiving and Christmas holiday season.   This letter is to introduce [name] to you in the hopes that you  might be able to see her and potentially help her with her atrial  arrhythmias.   She is a 77 year old woman who was for many years followed at the  [place] [facility] [place] who was also submitted for surgery at the  [facility] for repair of mitral valve with a Cosgrove ring.  She  underwent a mini invasive procedure in the year 2000 and access was  obtained to the left atrium initially through the fossa ovalis then the  opening was extended to the dome of the left atrium.  For mitral  valve, underwent quadrangular resection.  I did not see comments in the  note about anything down the left atrial appendage.   In any case, her postoperative course was complicated by recurrent  problems with rapid atrial arrhythmias and associated significant  symptoms of shortness of breath and exercise intolerance.  Unfortunately, her sinus rhythm was complicated by bradycardia.   I met her a number of years ago and she elected at that time to go back  to the [place] [facility] [place] for an ablation procedure; this turned out  to be right-sided procedure only.   We then attempted to control her atrial arrhythmias with different  antiarrhythmic drugs.  Amiodarone was complicated by swelling and  hypothyroidism, and certainly the bradycardia did not help.  She further  had elevated LFTs.  Tikosyn was considered, but her QT interval was  always borderline in the 440-460 range precluding diffuse as well as  sotalol.   In any case for a while she did quite well with a sinus rhythm.  She  felt pretty good, but  of recent months has had increasing problems.  Electrocardiograms over the last 6 months have all shown a 2-1 atrial  flutter with a ventricular response at about 100 beats per minute.  Left  ventricular function last assessed in March, was normal.  Repeat echo  assessment is pending.   Recently, she has had problems with increasing exercise intolerance.  A  little better peripheral edema, but no nocturnal dyspnea or orthopnea.   We have tried different beta-blockers in the past.  She has been on  metoprolol short-acting and long-acting.  She has been on diltiazem and  currently is being managed with carvedilol.   Her other medications include Coumadin.   PHYSICAL EXAMINATION:  VITAL SIGNS:  Her blood pressure is 120/72.  Her  pulse is 97, her weight is 192, which is up about 10 pounds in the last  4 months.  NECK:  Her neck veins are 8-9 cm.  LUNGS:  Clear.  HEART:  Sounds are regular and rapid.  ABDOMEN:  Soft with active bowel sounds without midline pulsation.  EXTREMITIES:  Femoral pulses are 2+ and distal pulses are intact.   Presently, electrocardiograms are as reviewed above.   IMPRESSION:  1. Atrial fibrillation - paroxysmal and recurrent.  2. 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  3. History of a mitral valve repair (mini bypass) with an intraatrial      septal incision extended over the roof of the left atrium.  4. Class II to III heart failure - acute on chronic - diastolic with      repeat echo assessment pending.  5. Thromboembolic risk factors notable for;      a.     Age.      b.     Hypertension.      c.     Gender.   Tris, Ms. Giel would be very interested in talking to you to see if  there is something that you might be able to do for her symptoms.  I  look forward to talking with you about her.   Thank you again as always for your help.    Sincerely,       [name] Salvia, MD, [facility]  Electronically Signed    SCK/MedQ  DD: 01/30/2008  DT: 01/31/2008  Job #: [id number]

## 2010-08-05 NOTE — Letter (Signed)
September 30, 2006    Marne A. Tower, MD  [address] [place], [place] [address]   RE:  [name], [name]  MRN:  [id number]  /  DOB:  [birth year]   Dear Dr. Milinda Antis:   Upon your kind referral, I had the pleasure of evaluating your patient  and I am pleased to offer my findings.  I saw Chade Pitner in the office  today.  Enclosed is a copy of my progress note that details my findings  and recommendations.   Thank you for the opportunity to participate in your patient's care.    Sincerely,      Barbette Hair. Arlyce Dice, MD,FACG  Electronically Signed    RDK/MedQ  DD: 09/30/2006  DT: 09/30/2006  Job #: [id number]

## 2010-08-05 NOTE — Assessment & Plan Note (Signed)
[facility] [facility]                         ELECTROPHYSIOLOGY OFFICE NOTE   NAME:SLOANEBrieonna, Crutcher                        MRN:          [id number]  DATE:04/13/2007                            DOB:          Oct 14, 1932    Mrs. Beale comes in with atrial flutter.  She has a history of atrial  fibrillation for which she underwent cardioversion, has undergone a  procedure up at [place] [facility] [place] for some type of right atrial  ablation procedure.  She has had recurrences.  She was noted to out of  rhythm in October, and there have been persistent problems with exercise  intolerance and fatigue.  Her ejection fraction has been variable.  An  echocardiogram in February 2008 demonstrated overall modestly decreased  LV systolic function.  It was not quantitated.  An echocardiogram from  October 2007 demonstrated normal LV function.   Her current medications include metoprolol taking daily, Coumadin and  omega oils.   PHYSICAL EXAMINATION:  VITAL SIGNS:  Her blood pressure is 123/99. Her  pulse was 93, and she notes that her blood pressures have been  previously recorded in the high 90s.  LUNGS:  Clear.  CARDIAC:  Heart sounds were regular with neck veins of about 7-8 cm.  EXTREMITIES:  No edema.   Electrocardiogram dated today demonstrated an atrial tachycardia with  negative P waves in lead III and F and positive in leads I and L. They  were hard to discern in lead V1.   IMPRESSION:  1. Recurrent atrial arrhythmias with atrial tachycardia, atrial      flutter and atrial fibrillation.  2. Previous intolerance for AMIODARONE.  3. Variable left ventricular systolic function.  4. Modest congestive heart failure, question systolic versus      diastolic.  5. Coumadin therapy...   Mrs. Hemsley has modest symptoms.  The other question is whether or not  she had an untoward consequence as related to left ventricular function.  In the event that she is, I would  pursue more aggressive strategies of  rhythm control. Given her propensity towards bradycardia, that might  require antiarrhythmic drug therapy.  We also discussed AV junction  ablation as well as pulmonary vein isolation.  My inclination at this  point would be to try Tikosyn.  Her QT interval is borderline acceptable  most of the time.  We will check a potassium and magnesium today to  assess that.  We will repeat an echocardiogram at her convenience and  then make a decision thereafter.     [name] Salvia, MD, [facility]  Electronically Signed    SCK/MedQ  DD: 04/13/2007  DT: 04/13/2007  Job #: [id number]

## 2010-08-05 NOTE — Assessment & Plan Note (Signed)
[facility] [facility]                         ELECTROPHYSIOLOGY OFFICE NOTE   NAME:[name], [name]                        MRN:          [id number]  DATE:09/06/2007                            DOB:          10/09/1932    [name] comes in following a treadmill that was undertaken to look  for adequate heart rate control for atrial fibrillation/flutter.  She  was submitted for a standard Bruce protocol and by minute 1.5-2, her  heart rate was up to 160-170.  The patient was very frustrated by this  because she had specifically asked that she be not done on an incline.  She was then submitted for some other protocol which may have been a  [place] or __________  wherein she was kept flat and the heart rate  peaked at 136 and then dropped back down to about 110.   LVEF assessment was last undertaken in January and was normal.  She has  had remote evaluations with modest decrease in LV function.   We should also note that she was up at [place] [facility] [place] in  [place] a couple of years ago, where she underwent a right-sided  ablation procedure.  She has been disinclined to undergo a left-sided  ablation procedure.   She was on amiodarone.  This was complicated by hypothyroidism and  swelling.  She has been off it.  She has actually been off all rate-  control medications of late because of considerable bradycardia and  because of sparse visiting.  Her schedule and my schedule are not  working out very well.   Her current medications include only Coumadin.   Her heart rate today was 108 at rest.  Blood pressure was 133/82.  She  was in no acute distress.  Her lungs were clear.  Her heart sounds were  regular.  Extremities had just trace edema.  Neurological exam was  grossly normal.   Electrocardiogram today demonstrated atrial flutter with 2:1 conduction  at a heart rate of 111.  These flutter waves are negative in leads III,  upright in lead L, and  probably upright in lead I.   IMPRESSION:  1. Recurrent atrial arrhythmias with rapid ventricular response.  2. No current significant symptoms associated with it and no evidence      of left ventricular dysfunction.  3. Propensity towards bradycardia.  4. Previously identified intolerance to amiodarone.   Mrs. Lausch and I discussed this treadmill, and I suggested that I would  have undertaken a treadmill exactly as it was done except for the fact  that she had asked not to be on an incline.  I reiterated that her heart  rate response was excessive and not in her best interest.   We discussed treatment options again including catheter ablation, which  she is disinclined to pursue and medical therapy with and without  antiarrhythmic drugs.  She would like to start back on the metoprolol,  and so we will begin her on metoprolol succinate 25 mg a day.  If she is  tolerating that for 2 weeks, she is going to  go up to 50 mg a day and  then we will reassess it either by way of treadmill testing or by way of  Holter monitoring.   We will see her again in 4 weeks' time.     [name] Salvia, MD, [facility]  Electronically Signed    SCK/MedQ  DD: 09/06/2007  DT: 09/07/2007  Job #: [id number]

## 2010-08-05 NOTE — Assessment & Plan Note (Signed)
[place] [place] [facility]                         ELECTROPHYSIOLOGY OFFICE NOTE   NAME:Key Key RAUTH                        MRN:          [id number]  DATE:07/29/2006                            DOB:          1933/02/27    Key Key is seen.  She is status post Cardioversion.  She had a bunch  of PVCs and what sounds like after her Cardioversion, and then she has  been in sinus rhythm now for the last two weeks.  She feels the best she  has in a long time.  She is tolerating the amiodarone.   EXAMINATION:  VITAL SIGNS:  On examination today, her blood pressure was  102/62.  Pulse is 58.  LUNGS:  Clear.  HEART:  Sounds are regular.   Electrocardiogram demonstrated sinus rhythm.   We will plan to check her amiodarone surveillance labs and see her again  in 3 months' time.     [name] Salvia, MD, [facility]  Electronically Signed    SCK/MedQ  DD: 07/29/2006  DT: 07/29/2006  Job #: [id number]

## 2010-08-05 NOTE — Assessment & Plan Note (Signed)
[facility]                         ELECTROPHYSIOLOGY OFFICE NOTE   NAME:Mcclish, [name]                        MRN:          [id number]  DATE:09/27/2007                            DOB:          1932/09/19    [name] is seen about 3 weeks after we saw her last time for her  rapid atrial fibrillation.  We tried her on Toprol, which she had  recalled having tolerated which gave her a weird feeling.  We  discussed treatment options, which would include augmented alternative  rate control in the setting of her normal left ventricular function.  Tikosyn therapy, which she has not tried, and catheter ablation.  We  have decided to pursue options in that order.  We will plan to stop her  Toprol.  We will begin her on Coreg at 6.25 twice daily.  She is to take  it for a week.  If she tolerates it, she is to increase it to 12.5, and  we will up titrate from there.  If that does not work, we will  discontinue and put her on bisoprolol 5 mg once daily and up titrate  that to 10.  If those are not tolerated, we will plan to bring her in  hospital in about 4 or 5 weeks' time for Tikosyn loading and  consideration for pulmonary vein isolation subsequently.   On examination today, her blood pressure was 105/70 with a pulse of 103.  The lungs were clear.  The heart sounds were irregular.  The extremities  were without edema.   IMPRESSION AND PLAN:  As noted previously, we will see her again in 4  weeks.     [name] Salvia, MD, [facility]  Electronically Signed    SCK/MedQ  DD: 09/27/2007  DT: 09/28/2007  Job #: [id number]

## 2010-08-07 ENCOUNTER — Ambulatory Visit (INDEPENDENT_AMBULATORY_CARE_PROVIDER_SITE_OTHER): Payer: Medicare Other | Admitting: Family Medicine

## 2010-08-07 DIAGNOSIS — I4891 Unspecified atrial fibrillation: Secondary | ICD-10-CM

## 2010-08-07 DIAGNOSIS — Z5181 Encounter for therapeutic drug level monitoring: Secondary | ICD-10-CM

## 2010-08-07 DIAGNOSIS — Z7901 Long term (current) use of anticoagulants: Secondary | ICD-10-CM

## 2010-08-07 LAB — POCT INR: INR: 1.8

## 2010-08-08 NOTE — Assessment & Plan Note (Signed)
[facility]                         ELECTROPHYSIOLOGY OFFICE NOTE   NAME:[name], Key                          MRN:          [id number]  DATE:05/05/2006                            DOB:          03/18/1933    [name] came in today following an ablation procedure that was  described as a right-sided atrial fibrillation ablation that was  undertaken at the [place] [facility] [place] in the last month or so.  I had  seen her at [facility] because of bradycardia in the context of therapy for  her rapid atrial fibrillation.  I do not have access to my note from  there yet.   However, she was very tightly connected to a Dr. Jeannie Done at the  [place] [facility] [place] who arranged for the evaluation and procedure up  there.  She comes in now complaining of significant shortness of breath,  fatigue.  There is no significant peripheral edema or she is unaware of  palpitations.   Her medications currently include:  1. Warfarin.  2. Metoprolol 25 b.i.d.  3. Diltiazem 120 daily.   EXAMINATION:  Her blood pressure was 110/76.  Her resting rate was 127.  We put her on the treadmill and, in the process of getting up from the  bench and walking to the treadmill, her heart rate went to 155 and the  __________ test was terminated.   IMPRESSION:  1. Atrial fibrillation with an uncontrolled ventricular response.  2. Propensity towards significant bradycardia.  3. Status post right-sided ablation for atrial fibrillation.  4. Previously normal left ventricular function, question concern      status.  5. Thromboembolic risk factors notable for borderline age,      hypertension, and I think if I recall correctly a prior transient      ischemic attack.  6. Status post mitral valve repair for mitral valve prolapse with the      use of a Cosgrove ring.   [name] is in uncontrolled atrial fibrillation.  We will plan to  undertake TEE-guided cardioversion as her INR last week  was therapeutic  but her INR a week and a half ago or 2 weeks ago was subtherapeutic.  I  will need to discuss with the physicians at [place] what it is that  they would like to do next.  The alternative is a short-term range in  the drug therapy, probably Tikosyn or __________ to help to avoid the  need for antibradycardia pacing with consideration of a repeat  procedure.     [name] Salvia, MD, [facility]  Electronically Signed    SCK/MedQ  DD: 05/05/2006  DT: 05/05/2006  Job #: [id number]

## 2010-08-08 NOTE — Assessment & Plan Note (Signed)
[facility]                                 ON-CALL NOTE   NAME:SLOANEDeisy, [name]                          MRN:          [id number]  DATE:05/05/2006                            DOB:          26-Oct-1932    ON CALL TELEPHONE NOTE, May 05, 2006.   I received a call this evening from a Mr. [name] the son of patient  [name]. [name] apparently is scheduled for a TEE and  cardioversion in the a.m. and was seen this week by Dr. Graciela Husbands.  The  patient forgot to mention that she has had some numbness in her  forefinger and thumb over the past couple of weeks.  The son wanted to  be sure that this was not going to change things with regards to TE and  cardioversion.  I advised that she probably should have this evaluated,  however, it is unlikely that this would change the decision regarding TE  and cardioversion and that she should come in as scheduled.     [name], ANP  Electronically Signed    CB/MedQ  DD: 05/05/2006  DT: 05/05/2006  Job #: [id number]

## 2010-08-08 NOTE — Discharge Summary (Signed)
[name], [name]                 ACCOUNT NO.:  [id number]   MEDICAL RECORD NO.:  [id number]          PATIENT TYPE:  INP   LOCATION:  3013                         FACILITY:  [facility]   PHYSICIAN:  Genene Churn. Love, M.D.    DATE OF BIRTH:  1932/12/13   DATE OF ADMISSION:  01/24/2005  DATE OF DISCHARGE:  01/26/2005                                 DISCHARGE SUMMARY   REASON FOR ADMISSION:  This was the first [facility] admission for  this 77 year old left-handed white widowed female admitted from the  emergency room for evaluation of right facial weakness.   HISTORY OF PRESENT ILLNESS:  On the evening of January 24, 2005, following  her working in [place], the patient was driving to [place] and  noted the onset of facial numbness involving the V1 and V2 distribution.  There was no associated headache, syncope, chest pain, palpitations or  seizure.  She drove herself to the [facility] Emergency Room, where  she was seen and code stroke was called.  She complained of initially a  coldness and then numbness in her face.   PAST MEDICAL HISTORY:  Her past medical history is significant for mitral  valve prolapse, status post Cosgrove mitral ring in the year 2000, a  hemorrhoidectomy and a D&C.   PHYSICAL EXAMINATION:  NEUROLOGIC:  Her examination at the time of admission  was unremarkable.  There was some subjective loss of sensation in the right  V1 and V2 distribution.   LABORATORY DATA:  Her laboratory data revealed a CT scan of the brain which  was normal.   The pH was 7.337, venous pCO2 52.7, bicarb 28.3.  Hematocrit 43.   EKG showed normal sinus rhythm and incomplete right bundle branch block  pattern.   Serum sodium was 140, potassium 3.7, chloride 108, CO2 content 30, BUN 21  and glucose was 78.  Her cholesterol was 238, LDLs were high at 173, HDLs  were 40 and triglycerides were 123.  Urinalysis was unremarkable.  Glycosylated hemoglobin 5.9.   Homocysteine was 7.2.  Drugs of abuse were  none detected.  Her cardiac markers were unremarkable.   Her MRI study of the brain with and without contrast enhancement showed no  definite abnormalities and intracranial MRA was negative.   The telemetry was unremarkable.   HOSPITAL COURSE:  In the hospital, the patient noted improvement in her  numbness and tingling.  She was mobile.  She was able to tolerate 1 aspirin  daily.  Pending studies are Doppler of the carotids and 2-D echocardiogram.   IMPRESSION:  1.  Right facial numbness, code 782.0, most likely representing transient      ischemic attack.  2.  Hyperlipidemia, code 372.4.  3.  History of mitral valve prolapse, status post Cosgrove ring in the year      2000, code 421.  4.  Allergy to penicillin, code 995.0.   DISCHARGE MEDICATIONS:  Plan at this time is to discharge the patient on:  1.  One aspirin per day.  2.  Zocor 20 mg per day.  FOLLOWUP:  She will return to the office in 1 month to 6 weeks for followup  evaluation and obtain liver function tests at that time.   CONDITION ON DISCHARGE:  She is discharged improved, in pre-hospital status.   ACTIVITY:  She is not to drive a car for 2 days.           ______________________________  Genene Churn. Sandria Manly, M.D.     JML/MEDQ  D:  01/26/2005  T:  01/26/2005  Job:  [id number]

## 2010-08-08 NOTE — H&P (Signed)
[facility] ADMISSION   NAME:[name], [name]                        MRN:          [id number]  DATE:07/12/2006                            DOB:          04/10/1932    Mrs. Bernales comes in.  She is still in atrial fibrillation.  She is on  amiodarone.  She is feeling pretty well.   She was cardioverted about a month and one-half ago.  She held sinus  rhythm for about 4 days.  Her current amiodarone dose is at 200 mg a  day, having been down titrated because of side effects.   She is anticipating going to work in about 3 weeks.   Her other medications include:  1. Coumadin.  2. Toprol 25 once a day.   PHYSICAL EXAMINATION:  VITAL SIGNS:  Blood pressure 110/66, pulse 98 and  irregular.  LUNGS:  Clear.  CARDIAC:  Heart sounds were irregular without murmurs or gallops.  EXTREMITIES:  Without edema.   IMPRESSION:  1. Atrial fibrillation with a fairly rapid ventricular response, now      better controlled.  2. Propensity for sinus bradycardia.  3. Status post right-sided ablation for flutter.  4. Previously normal left ventricular function.  5. Thromboembolic risk factors notable for age, hypertension, and      prior transient ischemic attack.  6. Status post mitral valve repair with Cosgrove ring.   Mrs. Arpin is currently back in atrial fibrillation for which she is  taking amiodarone.  The amiodarone did not hold past the first 4-5 days  after her last cardioversion.   I think we have limited options.  1. Repeat cardioversion.  2. Augmented AV nodal rate control which may require backup brady      pacing.  3. Pulmonary vein isolation procedure.  4. AV nodal ablation and pacing.   She would like to not pursue #3.  To that end, we decided to proceed  with DC cardioversion.  In the event that this is inadequate, I think we  will stop the amiodarone as it is not worth the side effects and risk   profile.   At that point, we would begin augmenting AV nodal blockade, and if we  ended up with bradycardia, we would plan to proceed with pacing with an  eye towards AV junction ablation.   She understands and is willing to proceed.     [name] Salvia, MD, [facility]  Electronically Signed    SCK/MedQ  DD: 07/12/2006  DT: 07/12/2006  Job #: [id number]

## 2010-08-08 NOTE — Assessment & Plan Note (Signed)
[facility]                              CARDIOLOGY OFFICE NOTE   [name], Key                          MRN:          [id number]  DATE:02/02/1006                            DOB:          [birth year]    PRIMARY CARE PHYSICIAN:  Marne A. Milinda Antis, M.D.   REASON FOR PRESENTATION:  Evaluate a patient with atrial flutter.   HISTORY OF PRESENT ILLNESS:  The patient returns for followup of the above.  After the last visit, I have ordered an echocardiogram which demonstrated  the EF to be 55%.  There were no valvular abnormalities.  She had a previous  Cosgrove ring which seemed to be well placed and functioning in her mitral  valve.  The left atrium was moderately dilated at 46-mm.  She wore an event  monitor which demonstrated atrial flutter with predominant rate control.  There were PVCs.   The patient's has been thinking about flutter ablation.  She is due to see  Dr. Graciela Husbands in the next several days.  She would like to be off diltiazem and  the metoprolol.  She feels some slight orthostatic symptoms with this.  She  states she has a decreased exercise tolerance carrying groceries upstairs  for instance.  She has not had any presyncope or syncope.  She has not had  any chest discomfort, neck discomfort, arm discomfort, activity-induced  nausea, vomiting, excessive diaphoresis.   PAST MEDICAL HISTORY:  1. Atrial flutter.  2. Mitral valve prolapse with regurgitation, status post Cosgrove ring      placement.  3. Dyslipidemia.  4. Transient ischemic attack.  5. D&C.  6. Hemorrhoidectomy.  7. Hematuria.   ALLERGIES:  PENICILLIN.   MEDICATIONS:  1. Vytorin 10/20.  2. Metoprolol 50 mg a day.  3. Diltiazem 240 mg a day.  4. Mega-3 fatty acids.  5. Lasix 20 mg a day.  6. Warfarin.   REVIEW OF SYSTEMS:  As stated in the HPI and otherwise negative for other  systems.   PHYSICAL EXAMINATION:  GENERAL:  The patient is in no distress.  VITAL  SIGNS:  Blood pressure 102/63, heart rate 60 and regular, weight 184  pounds, body mass index 31.  HEENT:  Eyelids unremarkable.  Pupils are equal, round, and reactive to  light.  Fundi not visualized.  NECK:  No jugular venous distention.  Wave form within normal limits.  Carotid upstroke brisk and symmetric.  No bruits.  No thyromegaly.  LYMPHATICS:  No lymphadenopathy.  LUNGS:  Clear to auscultation bilaterally.  CHEST:  Well healed surgical scar.  HEART:  PMI not displaced or sustained.  S1 and S2 within normal limits.  No  S3, no murmurs.  ABDOMEN:  Obese.  Positive bowel sounds, normal in frequency and pitch.  No  bruits.  No rebound.  No guarding.  No midline pulsatile mass.  EXTREMITIES:  Pulses 2+, no edema.   ASSESSMENT/PLAN:  1. Atrial flutter.  The patient is going to see Dr. Graciela Husbands to discuss      possible ablation.  We have discussed cardioversion and  she does not      like the odds of this working without antiarrhythmic therapy.  We have      discussed the possibility of antiarrhythmic therapy but that flutter      ablation might be first line.  She is back on Coumadin.  I did receive      a note from urology saying that it would be safe to do this.  She seems      to have adequate rate control by the Holter.  I do not think I would be      able to back down on any of the medications, however.  2. Mitral valve repair.  The patient has a stable repair.  She has no      symptoms related to this.  No further evaluation is warranted.   FOLLOWUP:  The patient will come back to see Dr. Graciela Husbands for evaluation of  possible flutter ablation.     Rollene Rotunda, MD, [facility]  Electronically Signed    JH/MedQ  DD: 02/02/2006  DT: 02/02/2006  Job #: [id number]   cc:   Marne A. Milinda Antis, MD

## 2010-08-08 NOTE — Assessment & Plan Note (Signed)
[facility]                                 ON-CALL NOTE   NAME:[name], [name]                          MRN:          [id number]  DATE:03/27/2006                            DOB:          Aug 21, 1932    Call from [phone].  A patient of Dr. Milinda Antis.  Complaining of dizziness  and passing out with extremely low blood pressure.  I recommended she go  to the emergency room to be evaluated.     Lelon Perla, DO  Electronically Signed    Shawnie Dapper  DD: 03/27/2006  DT: 03/27/2006  Job #: [id number]   cc:   Marne A. Milinda Antis, MD

## 2010-08-08 NOTE — Op Note (Signed)
[name], [name]                 ACCOUNT NO.:  [id number]   MEDICAL RECORD NO.:  [id number]          PATIENT TYPE:  OIB   LOCATION:  2861                         FACILITY:  [facility]   PHYSICIAN:  [name] Salvia, MD, FACCDATE OF BIRTH:  07-28-1932   DATE OF PROCEDURE:  07/13/2006  DATE OF DISCHARGE:                               OPERATIVE REPORT   PREOPERATIVE DIAGNOSIS:  Atrial fibrillation.   POSTOPERATIVE DIAGNOSIS:  Sinus rhythm.   PROCEDURE:  Direct current cardioversion.   The patient was submitted to deep anesthesia under the care of Dr.  Ivin Booty.  She received 250 mg of pentothal.  A 120 joule shock was  delivered with the AP configuration synchronously with fibrillation,  restoring sinus rhythm.   The patient tolerated the procedure without apparent complication.      [name] Salvia, MD, [facility]  Electronically Signed     SCK/MEDQ  D:  07/13/2006  T:  07/13/2006  Job:  [id number]

## 2010-08-08 NOTE — Assessment & Plan Note (Signed)
[facility]                                   ON-CALL NOTE   NAME:[name], [name]                          MRN:          [id number]  DATE:12/24/2005                            DOB:          09-24-1932    TIME RECEIVED:  7:05 a.m.   TELEPHONE:  [id number].   Patient was discharged from the hospital about one week ago for diagnosis of  atrial fibrillation.  She has been on Coumadin ever since.  She feels fine  and has no problems.  However, this morning when she urinated, nothing but  blood seemed to come out.  She wants to know what to do.   My advise is to call Dr. Royden Purl office this morning and make an  appointment.  More than likely, her Coumadin level needs to be adjusted.            ______________________________  Tera Mater Clent Ridges, MD      SAF/MedQ  DD:  12/24/2005  DT:  12/25/2005  Job #:  [id number]   cc:   Marne A. Milinda Antis, MD

## 2010-08-08 NOTE — Consult Note (Signed)
[name], [name]                 ACCOUNT NO.:  [id number]   MEDICAL RECORD NO.:  [id number]          PATIENT TYPE:  INP   LOCATION:  1408                         [facility]ACILITY:  [facility]   PHYSICIAN:  Doylene Canning. Ladona Ridgel, MD    DATE OF BIRTH:  Dec 31, 1932   DATE OF CONSULTATION:  12/12/2005  DATE OF DISCHARGE:                                   CONSULTATION   CONSULTATION REQUESTED BY:  Dr. Darryll Capers   INDICATION FOR CONSULTATION:  Evaluation of atrial fibrillation with a rapid  ventricular response.   HISTORY OF PRESENT ILLNESS:  The patient is a 73-year woman with a history  of mitral valve repair while she was living in [place].  The patient  supposedly had postoperative atrial fibrillation which resolved.  She has a  history of a TIA of unexplained etiology a year ago with no neurologic  sequelae.  She was in her usual state of health when she was admitted to the  hospital with palpitations, chest pressure, left shoulder pain and  subsequently found to be in atrial fibrillation with rapid ventricular  response.  She has been placed on beta blockers and calcium channel blockers  with nice control of her atrial fibrillation.  The patient's chest pressure  has resolved.  She has had no syncope.   PAST MEDICAL HISTORY:  Notable for mitral prolapse, status post  hemorrhoidectomy, history of TIAs, history of a D&C in the past.  She  underwent exercise treadmill testing in February 2007 demonstrating no  evidence of ischemia.  She did not have any perfusion studies at that time.   SOCIAL HISTORY:  The patient lives in [place].  She denies tobacco use.  She is widowed.  She denies alcohol abuse.   FAMILY HISTORY:  Notable for a mother who is alive and well at age 51 and  father who died of lung cancer in his 43s.   MEDICATIONS:  Include Claritin, aspirin, omega-3, vitamins.   MEDICATIONS HERE AT [place]:  Include Cardizem, Toprol, enoxaparin, Coumadin  and Zocor.   REVIEW OF SYSTEMS:   Negative except as noted in the HPI.  She does note  occasional clumsiness of her hands at work several weeks ago.   PHYSICAL EXAMINATION:  GENERAL:  She is a pleasant, well-appearing 77-year-  old woman who looks younger than her stated age.  VITAL SIGNS:  The blood pressure was 95/60, the pulse was 87 and irregular,  respirations were 18, temperature was 98.  HEENT:  Normocephalic and atraumatic.  Pupils are equal and round.  Oropharynx was moist.  Sclerae anicteric.  NECK:  Revealed no jugular distension.  There was no thyromegaly.  Trachea  was midline.  The carotids are 2+ and symmetric.  LUNGS:  Clear bilaterally to auscultation.  There are no wheezes, rales or  rhonchi and no increased work of breathing.  CARDIOVASCULAR:  Revealed an irregularly regular rhythm with normal S1 and  S2.  There are no significant murmurs of mitral regurgitation.  ABDOMEN:  Soft, nontender, nondistended.  There was no organomegaly.  The  bowel sounds are present.  There is no rebound or guarding.  EXTREMITIES:  Demonstrated no cyanosis, clubbing or edema.  The pulses were  2+ and symmetric.  NEUROLOGIC:  Alert and oriented x3 with cranial nerves intact.  Strength was  5/5 and symmetric.   EKG demonstrates atrial fibrillation with a controlled ventricular response.   IMPRESSION:  1. Atrial fibrillation with a rapid ventricular response.  2. History of mitral valve repair.  3. Atypical chest pain.  4. History of transient ischemic attacks.   DISCUSSION:  I recommend that she be continued on Lovenox and Coumadin and  rate control with beta blockers and calcium channel blockers in combination.  I would recommend outpatient exercise Cardiolite stress testing with her  atypical chest pain symptoms and a negative regular exercise test in the  past.  Ultimately we may try to switch her over to rhythm control with a  cardioversion, but because she is minimally symptomatic at the present I  would like a  period of watchful waiting while we give her several weeks of  Coumadin.           ______________________________  Doylene Canning. Ladona Ridgel, MD     GWT/MEDQ  D:  12/13/2005  T:  12/15/2005  Job:  [id number]   cc:   [name] A. Tower, MD  [address] [place], [place] [address]

## 2010-08-08 NOTE — Assessment & Plan Note (Signed)
[facility]                              CARDIOLOGY OFFICE NOTE   [name], [name]                          MRN:          [id number]  DATE:12/29/2005                            DOB:          Dec 08, 1932    PRIMARY:  Dr. Roxy Manns.   REASON FOR PRESENTATION:  A patient with atrial fibrillation.   HISTORY OF PRESENT ILLNESS:  The patient was hospitalized on September 22  with atrial fibrillation with a rapid ventricular response.  This was  managed with anticoagulation and rate control.  She also had some atypical  chest discomfort with radiation to her jaw.  She went home on Lovenox while  her Coumadin became therapeutic.  She did have some hematuria and was taken  off of the Coumadin.  She saw a urologist and, just yesterday, had a  cystoscopy.  She was told there was no source of bleeding and that she could  restart the Coumadin.   The patient says she has had no further chest or neck discomfort.  She is  noticing rare palpitations.  She is getting a little bit light-headed when  she stands up but not noticing any light-headedness with the palpitations.  She has not been having any shortness of breath and denies chest discomfort,  neck discomfort, activity induced nausea and vomiting, excessive  diaphoresis.   PAST MEDICAL HISTORY:  1. Mitral valve prolapse with regurgitation status post Cosgrove ring.  2. Dyslipidemia.  3. Transient ischemic attack.  4. New onset atrial fibrillation/flutter.  5. D and C.  6. Hemorrhoidectomy.  7. Hematuria evaluated.   ALLERGIES:  PENICILLIN.   CURRENT MEDICATIONS:  1. Vytorin 10/20.  2. Metoprolol 50 mg daily.  3. Diltiazem 240 mg a day.  4. Omega-3 fatty acids.  5. Lasix 20 mg daily.   REVIEW OF SYSTEMS:  As stated in the HPI and negative for other systems.   PHYSICAL EXAMINATION:  The patient is in no distress.  Blood pressure 102/62, heart rate 71 and irregular, weight 184 pounds, body  mass index 31.  HEENT:  Eyes unremarkable.  Pupils equally round and reactive to light.  Fundi not visualized.  Oral mucosa unremarkable.  NECK:  No jugular venous distention, 45 degrees.  Carotid upstroke brisk and  symmetric.  No bruits.  No thyromegaly.  LYMPHATICS:  No cervical, axillary, inguinal adenopathy.  LUNGS:  Clear to auscultation bilaterally.  BACK:  No costovertebral angle tenderness.  CHEST:  Unremarkable.  HEART:  PMI not displaced or sustained.  S1 and S2 within normal limits.  No  S3.  No S4.  No murmurs.  ABDOMEN:  Mildly obese, positive bowel sounds, normal in frequency and  pitch, no bruits, no rebound, no guarding, no midline pulsatile mass, no  organomegaly.  SKIN:  No rashes, no nodules.  EXTREMITIES:  2+ pulses, trace lower extremity edema, no cyanosis, or  clubbing.  NEURO:  Oriented to person, place, and time.  Cranial nerves II through XII  grossly intact.  Motor grossly intact.   EKG:  Atrial flutter with variable conduction, leftward  axis, RSR prime in  V1 and V2, no acute ST-T wave changes.   ASSESSMENT AND PLAN:  1. Atrial fibrillation/flutter.  The patient is tolerating this rhythm      without much in the way of symptoms.  I am not sure that it is causing      any of the fatigue that she is describing.  For now, I am going to      continue with rate control and restart the Coumadin as she has had      clearance from her urologist to do this.  She has been told to take 5      mg of Coumadin tonight and 5 mg tomorrow night.  She is going to go to      Dr. Royden Purl office on Thursday to get her INR checked.  In 2 weeks, I      am going to have her wear a 48-hour Holter monitor to make sure we have      adequate rate control.  She will get an echocardiogram as well to      reevaluate her mitral valve and look for any other abnormalities in      light of this new arrhythmia.  I will see her back in 4 weeks to      followup on this.  Her daughter was in  the office today and asked many      questions and I reviewed this plan in detail.  2. Fatigue. The patient is complaining of this.  Again, this could be      related to the rhythm.  It could be related to medications.  I am going      to follow this to see if there is resolution in the next couple of      weeks.  If not, we will see if we can titrate back on her medications      or whether I need to pursue rhythm control.  3. Chest discomfort.  She had atypical symptoms.  She had pristine      coronaries in 2000.  We will consider a stress perfusion study going      forward, particularly if she has any new symptoms.  4. Mitral regurgitation.  This will be evaluated as above.  5. Followup.  We will see her back in 4 weeks.            ______________________________  Rollene Rotunda, MD, [facility]     JH/MedQ  DD:  12/29/2005  DT:  12/31/2005  Job #:  [id number]   cc:   Marne A. Milinda Antis, MD

## 2010-08-08 NOTE — H&P (Signed)
[name], [name]                 ACCOUNT NO.:  [id number]   MEDICAL RECORD NO.:  [id number]          PATIENT TYPE:  EMS   LOCATION:  MAJO                         FACILITY:  [facility]   PHYSICIAN:  Genene Churn. Love, M.D.    DATE OF BIRTH:  16-Aug-1932   DATE OF ADMISSION:  01/24/2005  DATE OF DISCHARGE:                                HISTORY & PHYSICAL   This is the first [facility] admission for this 77 year old left-  handed white widowed female admitted from the emergency room for evaluation  of right facial numbness following a code stroke.   HISTORY OF PRESENT ILLNESS:  [name] has no known history of  hypertension, diabetes or stroke. She does have a known history of mitral  valve prolapse and underwent Cosgrove ring procedure by Dr. Elsie Ra at the  [facility] in the year 2000. She has not been taking aspirin therapy.  She has no known prior history of to suggest stroke. The day of admission at  about 5 p.m., while driving home from work in [place], she noted the  onset of a cold sensation of the right side of her face, associated with  numbness without headache, syncope, chest pain, palpitations, or seizure.  She drove herself to the [facility] Room and a code  stroke was called at about 7:40 p.m.   Past medical history is significant for mitral valve prolapse, status post  mitral ring placement in the year 2000, hemorrhoidectomy, and a D&C.   She currently takes no medications. She does not smoke cigarettes. She has a  history of allergy to PENICILLIN. She does drink alcohol approximately once  per month.   SOCIAL HISTORY:  She is widowed. She finished two years of college. 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   FAMILY HISTORY:  Her mother is 70 living and well. Her father died at age 70  of lung cancer. She has no brothers and no sisters. She has four sons, 46 to  29 and one daughter 16, living and well. One daughter died at  64 of unknown  causes.   PHYSICAL EXAMINATION:  Revealed a well-developed white female in no acute  distress. Weight not obtained. Sitting blood pressure in the right and left  arm 140/80, heart rate is 64 and regular. There were no bruits. Mental  status shows she was alert and oriented x3. Follows one, two, and three-step  commands. There is no aphasia, apraxia or agnosia. Cranial nerve examination  revealed visual fields full. Disks flat. Extraocular movements full.  Corneals present. No 7th nerve palsy. There was decreased pinprick in right  V1 and V2 distribution. Tongue was midline. The uvula was midline. Gags were  present. Sternocleidomastoid and trapezius testing were normal. Motor  examination revealed 5/5 strength proximally and distally in the upper and  lower extremities. No evidence of drift, coordination testing normal.  Sensory examination intact to pinprick and light touch, normal position and  vibration testing except for the right V1 and V2 distribution. Deep tendon  reflexes 1 to 2+  and plantar responses downgoing.  General examination revealed tympanic membranes to be clear. Lungs were  clear to auscultation. Examination of the heart revealed no murmurs. Bowel  sounds were normal. There was no enlargement of the liver, spleen, or  kidney. There was no clubbing, cyanosis, or edema in the extremities.   IMPRESSION:  1.  Right facial numbness in the V1 and V2 distribution, code 782.0.  2.  History of mitral valve disease, code 421.0.   PLAN:  Admit the patient for MRI study of the brain to rule out the  possibility of a thalamic stroke.           ______________________________  Genene Churn. Sandria Manly, M.D.     JML/MEDQ  D:  01/24/2005  T:  01/24/2005  Job:  [id number]   cc:   Marne A. Tower, M.D. [facility]  [address], [place]  [place] [address]

## 2010-08-08 NOTE — Discharge Summary (Signed)
[name], [name]                 ACCOUNT NO.:  [id number]   MEDICAL RECORD NO.:  [id number]          PATIENT TYPE:  INP   LOCATION:  1408                         FACILITY:  [facility]   PHYSICIAN:  Valerie A. Felicity Coyer, MDDATE OF BIRTH:  07/09/1932   DATE OF ADMISSION:  12/12/2005  DATE OF DISCHARGE:  12/14/2005                                 DISCHARGE SUMMARY   DISCHARGE DIAGNOSES:  1. New onset atrial fibrillation versus atrial flutter with rapid      ventricular response, rate improved, continue chronic anticoagulation      to be bridged as Lovenox as outpatient, stable for discharge home.  2. Dyslipidemia.  Previously intolerance of simvastatin, we will try      Vytorin, outpatient follow up with primary medical doctor.  3. History of mitral valve repair.  4. Atypical chest discomfort.  Cardiac enzymes negative likely due to #1.      Outpatient stress nuclear.  5. History of transient ischemia attack in November 2006.  6. History of gastroesophageal reflux disease.  Continue home proton pump      inhibitor.   DISCHARGE MEDICATIONS INCLUDE:  1. Diltiazem CD 240 mg p.o. daily.  2. Toprol XL 50 mg p.o. daily.  3. Coumadin 5 mg once p.m. or as directed by primary care physician      office.  INR at discharge 1.0.  4. Lovenox 80 mg subcu b.i.d. x5 days or until INR therapeutic, dispense      #10, prescription provided.  5. Aspirin 81 mg p.o. daily.  6. Vytorin 10/20 one p.o. q.p.m.  7. Other medications are as prior to admission and include:      a.     Prevacid 30 mg p.o. daily.      b.     Claritin 10 mg p.o. daily p.r.n. allergies.      c.     Calcium two tablets p.o. daily.      d.     Omega-3 one tablet b.i.d.      e.     Red rice yeast b.i.d.   DISPOSITION:  The patient is discharged home in medically stable and  improved condition.  She is asymptomatic and has reviewed plans for  medications for rate control, anticoagulation as well as outpatient  followup.   Outpatient followup is scheduled at primary care physician office for  tomorrow, Tuesday, September 25 at 9:15 for a Protime check as well as visit  with primary MD.  At this visit besides management of Coumadin and rate  control she will need to be arranged for outpatient stress test and  echocardiogram at [facility] to be done in the next few weeks per  recommendations of Dr. Donnamarie Rossetti.  The patient is instructed to call her  primary care physician office with questions.   CONDITION ON DISCHARGE:  Medically stable and improved.   CONSULTATIONS INCLUDE:  [place] cardiology, Dr. Lewayne Bunting.  Previously  seen by Dr. Angelina Sheriff as an outpatient.   HOSPITAL COURSE BY PROBLEM:  Problem 1. RAPID ATRIAL FIBRILLATION/FLUTTER.  The patient is  a pleasant 78 year old woman basically healthy who came to  the emergency room complaining of palpitations as well as question of  missing keystrokes while typing.  She was found to be in rapid atrial  fibrillation in the emergency room and was admitted for rate control,  anticoagulation as well as telemetry to rule out acute coronary syndrome.  Her enzymes serially were negative and she had no further neurologic  symptoms during this hospitalization.  A cardiology consult was obtained the  next morning and was seen by Dr. Lewayne Bunting who agreed with Lovenox and  Coumadin as well as rate control as tolerated with beta blocker and calcium  channel blocker.  He agreed with an outpatient exercise Cardiolite and Dr.  Dietrich Pates in followup also agreed the patient was stable for discharge home  as her rate has been generally controlled.  The patient is asymptomatic and  can be pursued for outpatient evaluation of Cardiolite and echocardiogram to  be done in the next 2 weeks as she has had no further neurologic symptoms.  The MRI of her brain has been cancelled and she is safe to continue  anticoagulation for irregular heart rhythm as described  above.  Outpatient  followup with primary MD is closely arranged for tomorrow for continued  monitoring of anticoagulation, no other acute issues requiring continued  hospitalization.   Problem 2. DYSLIPIDEMIA.  Patient previously had been treated with  simvastatin and discontinued due to myalgias.  Her cholesterol at this time  was still elevated with a total of 299, triglycerides of 152, HDL low at 38  and LDL high at 231.  Because of this she was resumed on cholesterol  therapy.  At time of discharge we have chosen Vytorin and will need to be  closely monitored as an outpatient for adverse side effects.  It is felt at  this time the risk versus benefit of treatment of dyslipidemia given history  of TIA, cardiac dysrhythmia at this time and age warrant retrial of this  medication.  Outpatient followup as scheduled.   Problem 3. OTHER MEDICAL ISSUES.  The patient's other medical issues are as  prior to admission and as listed in handwritten H&P by Dr. Lovell Sheehan.  Other  laboratory data was unremarkable.  The patient is felt stable for discharge  home.      Valerie A. Felicity Coyer, MD  Electronically Signed     VAL/MEDQ  D:  12/14/2005  T:  12/15/2005  Job:  [id number]

## 2010-08-08 NOTE — H&P (Signed)
NAMEHAJER, DWYER NO.:  [id number]   MEDICAL RECORD NO.:  [id number]          PATIENT TYPE:  OIB   LOCATION:  2899                         FACILITY:  [facility]   PHYSICIAN:  [name] Salvia, MD, FACCDATE OF BIRTH:  21-Oct-1932   DATE OF ADMISSION:  06/18/2006  DATE OF DISCHARGE:  06/18/2006                              HISTORY & PHYSICAL   ELECTROPHYSIOLOGIST:  Sherryl Manges, MD   This is a 77 year old female with a history of atrial fibrillation.  She  had a right-sided atrial fib ablation, January of 2008.  She is  currently on amiodarone.  She had cardioversion after transesophageal  echocardiogram, May 06, 2006, for recurrence of her atrial  fibrillation.  She visited the electrophysiology office in June 03, 2006.  The findings were that she is tolerating her amiodarone at 400 mg  daily, but she is in recurrent atrial fibrillation.  A redo  cardioversion is planned for today, March 28.  Her amiodarone was  reduced from 400 mg to 200 mg daily.   MEDICATIONS:  1. Amiodarone 200 mg daily.  2. Metoprolol  25 mg twice daily.  3. ?  Diltiazem.   PAST MEDICAL HISTORY:  1. Atrial fibrillation, symptomatic.  Dyspnea/fatigue, no      palpitations.  2. Status post right-sided atrial fibrillation.  3. Hypertension.  4. History of transient ischemic attack.  5. Chronic Coumadin.  6. Status post mitral valve annuloplasty.  7. Dyslipidemia.   PLAN:  Cardioversion.  A little later than originally planned today.  It  was set for 12 o'clock.  The patient will arrive at 12 o'clock.   LABORATORY STUDIES:  Will be obtained.  A 12-lead EKG, IV access.      Maple Mirza, [place]      [name] Salvia, MD, [facility]  Electronically Signed    GM/MEDQ  D:  06/18/2006  T:  06/18/2006  Job:  [id number]

## 2010-08-08 NOTE — Cardiovascular Report (Signed)
[name], [name]                 ACCOUNT NO.:  [id number]   MEDICAL RECORD NO.:  [id number]          PATIENT TYPE:  AMB   LOCATION:  CATH                         FACILITY:  [facility]   PHYSICIAN:  Pricilla Riffle, MD, FACCDATE OF BIRTH:  1932-06-08   DATE OF PROCEDURE:  05/06/2006  DATE OF DISCHARGE:                            CARDIAC CATHETERIZATION   IDENTIFICATION:  Ms. Gonsoulin is a 77 year old with a history of AFib.  Underwent cardioversion.   PROCEDURE:  Cardioversion:  The patient underwent TEE which prior to the  procedure with sedation, she was anesthetized per anesthesia with 225 mg  of Pentothal IV.  With the pads in AP position, she was cardioverted to  normal sinus rhythm with 120 joules synchronized biphasic energy.  A 12-  lead EKG is pending.  Procedure without complications.      Pricilla Riffle, MD, [facility]  Electronically Signed     PVR/MEDQ  D:  05/06/2006  T:  05/07/2006  Job:  [id number]

## 2010-08-08 NOTE — Op Note (Signed)
[name], [name]                 ACCOUNT NO.:  [id number]   MEDICAL RECORD NO.:  [id number]          PATIENT TYPE:  OIB   LOCATION:  2899                         FACILITY:  [facility]   PHYSICIAN:  [name] Salvia, MD, FACCDATE OF BIRTH:  12-11-1932   DATE OF PROCEDURE:  06/18/2006  DATE OF DISCHARGE:  06/18/2006                               OPERATIVE REPORT   PREOPERATIVE DIAGNOSIS:  Atrial fibrillation.   POSTOPERATIVE DIAGNOSIS:  Sinus rhythm.   The patient was submitted to deep anesthesia under the care of Dr. Sondra Come.  She received 225 mg of sodium Pentothal.  A single shock delivered  synchronously terminated defibrillation and restored sinus rhythm.   The patient tolerated the procedure without apparent complications.      [name] Salvia, MD, [facility]  Electronically Signed     SCK/MEDQ  D:  09/30/2006  T:  09/30/2006  Job:  [id number]

## 2010-08-08 NOTE — Assessment & Plan Note (Signed)
[facility]                         ELECTROPHYSIOLOGY OFFICE NOTE   NAME:[name], [name]                        MRN:          [id number]  DATE:06/03/2006                            DOB:          [birth year]    [name] comes in today feeling much better.  Her exercise tolerance  is improving.  She has not noted any irregular rates.  She is tolerating  her amiodarone; however, on her last amiodarone surveillance  laboratories, her LFTs were noted to be mildly elevated at 50 and 59.  They had previously been normal.  Her PFTs demonstrated a DLCO of 75%.   PHYSICAL EXAMINATION:  VITAL SIGNS:  Her weight is 193, which is down a  couple of ponds.  Her blood pressure is 110/80.  LUNGS:  Clear.  CARDIAC:  Heart sounds were irregular.  EXTREMITIES:  Without edema.   Electrocardiogram demonstrated atrial fibrillation at 96 per minute with  intervals of --/0.10/0.37 with axis of a leftward -37.   IMPRESSION:  1. Atrial fibrillation with a modest ventricular response.  2. Liver function test abnormalities questionably related to the      amiodarone.  3. Double oral anticoagulation.   [name] and I have discussed a number of things, including:  1. Eliminating her aspirin.  2. I will plan to decrease her amiodarone from 400 to 200 mg at      Easter.  3. Undertaking some attempt at augmented rate control.  The options      for this would include:  (a) additional rate control using      amiodarone plus metoprolol; (b) DC cardioversion; (c) procedural      solutions either with AV ablation and pacing and/or PVI.   After discussing the positives and negatives, we have decided to proceed  with DC cardioversion the week after Easter.   She will also need her LFTs checked next week when her INR is checked to  reassess these following the elevation noted with the amiodarone  therapy.  Hopefully, they were related to her statin therapy.   IMPRESSION:  1.  Atrial fibrillation with a now better-controlled ventricular      response.  2. Propensity toward bradycardia.  3. Status post right-sided ablation for atrial flutter.  4. Previously normal left ventricular function.  5. Thromboembolic risk factors notable for borderline age,      hypertension and prior transient ischemic attack.  6. Status post mitral valve repair with Cosgrove ring.  7. Elevated liver function tests on amiodarone.   PLAN:  As noted above.     [name] Salvia, MD, [facility]  Electronically Signed    SCK/MedQ  DD: 06/03/2006  DT: 06/05/2006  Job #: [id number]   cc:   [name] A. Milinda Antis, MD

## 2010-08-11 ENCOUNTER — Telehealth: Payer: Self-pay | Admitting: Internal Medicine

## 2010-08-11 NOTE — Telephone Encounter (Signed)
Pt's mother having sob two days, feet and ankles swollen for years but last two days seems worse, has weak heart and had problem with fluid around heart lives in [place] but here until first of June, she wanted to see someone for breathing but dtr wants her to see dr Graciela Husbands since she's a pt, she is 35 yrs will he see her?

## 2010-08-11 NOTE — Telephone Encounter (Signed)
If we can assist [name] by seeing her mother that would be fine.  I agree it was very hard to take care of her however in the absence of any data. An echo the day she comes in with be helpful. If my schedule doesn't permit on Thursday perhaps Lorin Picket can see her. Thanks and had a great play

## 2010-08-11 NOTE — Telephone Encounter (Signed)
I have spoken with the pt's daughter and made her aware that Dr. Graciela Husbands would see her mother. Since Scott cannot see new patient's and Dr. Graciela Husbands will be out of the office next week, we will add her on to the schedule for Thursday 5/24 at 3:45pm. I have explained that she will probably have to wait and they verbalize understanding. I have also advised her that Dr. Graciela Husbands would like for the patient to have an echo on the day she comes in, but there is no availability now. I will ask the schedulers to call her tomorrow and get Ms. Clarida's mother's information for her appt. I will have them check on adding her on to an echo schedule for Thursday as well. I also explained that we will need records on the pt. They will need to sign a release of information. She will call back tomorrow should she not hear anything from our office by noon tomorrow.

## 2010-08-11 NOTE — Telephone Encounter (Signed)
I called and spoke with Ms. Torrez. She states that her 77 year old mother has been up here with her from [place] since Mother's Day. She has had edema to her lower extremities for years, but over the last few days, she has developed chest congestion, SOB, and weakness. She sees a cardiologist in [place], but they are not very happy with the care she is receiving there. Her meds include ASA, metoprolol, simvastatin, furosemide 20mg  daily, isosorbide, NTG patch prn, and klor con. Ms. Blucher is not certain if the patient has been taking her furosemide every day. I explained to Ms. Rehm that since we do not know her history and she will be returning to [facility], I am not sure if Dr. Graciela Husbands will want me to work her in on the schedule or have her see someone else (such as Urgent Care). Ms. Dettmann states she really prefers her mother see Dr. Graciela Husbands prior to her return to [facility] at the end of the month. I explained I will need to review with Dr. Graciela Husbands. Ms. Colford is agreeable.

## 2010-08-21 ENCOUNTER — Ambulatory Visit: Payer: Medicare Other

## 2010-08-25 ENCOUNTER — Ambulatory Visit (INDEPENDENT_AMBULATORY_CARE_PROVIDER_SITE_OTHER): Payer: Medicare Other | Admitting: Family Medicine

## 2010-08-25 DIAGNOSIS — I4891 Unspecified atrial fibrillation: Secondary | ICD-10-CM

## 2010-08-25 DIAGNOSIS — Z7901 Long term (current) use of anticoagulants: Secondary | ICD-10-CM

## 2010-08-25 DIAGNOSIS — Z5181 Encounter for therapeutic drug level monitoring: Secondary | ICD-10-CM

## 2010-08-25 LAB — POCT INR: INR: 2.8

## 2010-08-25 NOTE — Patient Instructions (Signed)
Continue current dose, check in 4 weeks  

## 2010-09-03 ENCOUNTER — Other Ambulatory Visit: Payer: Self-pay | Admitting: Family Medicine

## 2010-09-03 NOTE — Telephone Encounter (Signed)
Px written for call in   

## 2010-09-03 NOTE — Telephone Encounter (Signed)
Medication phoned to [facility] [address] pharmacy as instructed. Pt will ck back with pharmacy from earlier phone call.

## 2010-09-03 NOTE — Telephone Encounter (Signed)
Spoke with pt and she takes 7.5 mg on Sundays and Thursdays. Pt would like Coumadin 3 mg sent to [facility] on [address] so she will not use up all of her 1mg  tabs. Pt is not out of med yet and pt will ck with pharmacy on 09/04/10.

## 2010-09-22 ENCOUNTER — Ambulatory Visit (INDEPENDENT_AMBULATORY_CARE_PROVIDER_SITE_OTHER): Payer: Medicare Other | Admitting: Family Medicine

## 2010-09-22 DIAGNOSIS — Z7901 Long term (current) use of anticoagulants: Secondary | ICD-10-CM

## 2010-09-22 DIAGNOSIS — I4891 Unspecified atrial fibrillation: Secondary | ICD-10-CM

## 2010-09-22 DIAGNOSIS — Z5181 Encounter for therapeutic drug level monitoring: Secondary | ICD-10-CM

## 2010-09-22 LAB — POCT INR: INR: 2.8

## 2010-09-22 NOTE — Patient Instructions (Signed)
Continue 5 mg daily, 7.5 mg sun, thurs, recheck 4 weeks

## 2010-10-09 ENCOUNTER — Encounter: Payer: Self-pay | Admitting: Internal Medicine

## 2010-10-13 ENCOUNTER — Ambulatory Visit (INDEPENDENT_AMBULATORY_CARE_PROVIDER_SITE_OTHER): Payer: Medicare Other | Admitting: Internal Medicine

## 2010-10-13 ENCOUNTER — Encounter: Payer: Self-pay | Admitting: Internal Medicine

## 2010-10-13 DIAGNOSIS — I429 Cardiomyopathy, unspecified: Secondary | ICD-10-CM | POA: Insufficient documentation

## 2010-10-13 DIAGNOSIS — I4891 Unspecified atrial fibrillation: Secondary | ICD-10-CM

## 2010-10-13 LAB — BASIC METABOLIC PANEL
BUN: 20 mg/dL (ref 6–23)
Calcium: 8.6 mg/dL (ref 8.4–10.5)
Chloride: 105 mEq/L (ref 96–112)
Creatinine, Ser: 0.7 mg/dL (ref 0.4–1.2)
GFR: 94.2 mL/min (ref >60.00)

## 2010-10-13 LAB — CBC WITH DIFFERENTIAL/PLATELET
Basophils Relative: 0.6 % (ref 0.0–3.0)
Eosinophils Relative: 3.2 % (ref 0.0–5.0)
Lymphocytes Relative: 19.7 % (ref 12.0–46.0)
Monocytes Absolute: 0.4 10*3/uL (ref 0.1–1.0)
Monocytes Relative: 11.3 % (ref 3.0–12.0)
Neutrophils Relative %: 65.2 % (ref 43.0–77.0)
Platelets: 125 10*3/uL — ABNORMAL LOW (ref 150.0–400.0)
RBC: 4.59 Mil/uL (ref 3.87–5.11)
WBC: 3.7 10*3/uL — ABNORMAL LOW (ref 4.5–10.5)

## 2010-10-13 LAB — TSH: TSH: 1.72 u[IU]/mL (ref 0.35–5.50)

## 2010-10-13 LAB — MAGNESIUM: Magnesium: 2.5 mg/dL (ref 1.5–2.5)

## 2010-10-13 MED ORDER — METOPROLOL TARTRATE 25 MG PO TABS: 25.0000 mg | ORAL_TABLET | Freq: Two times a day (BID) | ORAL | Status: DC

## 2010-10-13 NOTE — Patient Instructions (Signed)
Your physician has recommended you make the following change in your medication:  1) Start Metoprolol tartrate 25mg  one tablet by mouth twice daily.  Your physician recommends that you have lab work today: bmet/ magnesium/cbc/tsh(427.31).  Plan for admission for Tikosyn loading on Friday.

## 2010-10-13 NOTE — Assessment & Plan Note (Addendum)
I will be concerned given the trace edema and exercise intolerance and her prior history of tachycardia his cardiomyopathy. We will plan to check an echo When she presents to hospital

## 2010-10-13 NOTE — Assessment & Plan Note (Addendum)
She has recurrent atrial fibrillation following her pulmonary vein isolation and discontinuation of Tikosyn. Will plan to admit her to hospital and reinitiate Tikosyn and. We'll check her metabolic parameters today We'll also check a TSH and a CBC as potential other provocateurs of her AF

## 2010-10-13 NOTE — Progress Notes (Signed)
  HPI  [name] is a 77 y.o. female seen following an atrial fibrillation ablation undertaken at [facility] in March2010. The postprocedural interval was complicated by recurrent atrial arrhythmias prompting initiation of Tikosyn following cardioversion for atrial tachycardia. This was stopped last fall. About 62 months ago she noted increasing exercise intolerance. There has been some orthostatic lightheadedness and occasional palpitations  There has been no peripheral edema Past Medical History  Diagnosis Date  . Junctional rhythm   . Cardiomyopathy     rate-related-resolved  . Pulmonary nodule   . Depression   . Asthma   . Allergic rhinitis   . FH: colonic polyps   . Hypothyroidism   . Carotid stenosis     mild (hosp 3/11)- consult by vasc/ Dr Arbie Cookey  . Atrial fibrillation     status post PCI [name] 2010 on Tikosyn     Past Surgical History  Procedure Date  . Mitral valve replacement   . Pilonidal cyst removal   . Colonoscopy 2001  . Exercise stress test 2/07  . Ct of abd and pelvis 10/07    negative  . Carotid doppler 10/07    no stenosis  . Gross hematuria on anticoagulants 10/07    neg urol work up   . Admit- syncope, bradycardia     cardiac and neuro work up  . Colonoscopy- polyps 8/08  . Tia 3/11    nl imaging and ech, with mod carotid stenosis (had vasc consul  . Appendectomy     Current Outpatient Prescriptions  Medication Sig Dispense Refill  . multivitamin (THERAGRAN) per tablet Take 1 tablet by mouth daily.        [name] warfarin (COUMADIN) 3 MG tablet TAKE ONE TABLET BY MOUTH AS DIRECTED  30 tablet  3    Allergies  Allergen Reactions  . Amiodarone Hcl     REACTION: Intolerance  . Penicillins     REACTION: rash  . Statins     REACTION: rash    Review of Systems negative except from HPI and PMH  Physical Exam Well developed and well nourished in no acute distress HENT normal E scleral and icterus clear Neck Supple JVP flat; carotids brisk and  full Clear to ausculation Irregular rate and rhythm and somewhat fast Soft with active bowel sounds No clubbing cyanosis ; trace edema Alert and oriented, grossly normal motor and sensory function Skin Warm and Dry  ECG Atrial fibrillation at 106 Intervals-/0.09/0.32 with a QTC of 0.42  Assessment and  Plan

## 2010-10-16 ENCOUNTER — Telehealth: Payer: Self-pay | Admitting: Pharmacist

## 2010-10-16 DIAGNOSIS — I4891 Unspecified atrial fibrillation: Secondary | ICD-10-CM

## 2010-10-16 NOTE — Assessment & Plan Note (Signed)
Patient called in reference to her Tikosyn start.  I have instructed her that she will receive a call from hospital admissions letting her know when to come in and what room she will be admitted to.

## 2010-10-17 ENCOUNTER — Inpatient Hospital Stay (HOSPITAL_COMMUNITY)
Admission: AD | Admit: 2010-10-17 | Discharge: 2010-10-20 | DRG: 310 | Disposition: A | Discharge status: Home / Self Care | Payer: Medicare Other | Source: Ambulatory Visit | Attending: Internal Medicine | Admitting: Internal Medicine

## 2010-10-17 ENCOUNTER — Encounter: Payer: Self-pay | Admitting: Internal Medicine

## 2010-10-17 DIAGNOSIS — Z7901 Long term (current) use of anticoagulants: Secondary | ICD-10-CM

## 2010-10-17 DIAGNOSIS — I4891 Unspecified atrial fibrillation: Principal | ICD-10-CM | POA: Diagnosis present

## 2010-10-17 DIAGNOSIS — Z8673 Personal history of transient ischemic attack (TIA), and cerebral infarction without residual deficits: Secondary | ICD-10-CM

## 2010-10-17 DIAGNOSIS — Z954 Presence of other heart-valve replacement: Secondary | ICD-10-CM

## 2010-10-17 DIAGNOSIS — Z79899 Other long term (current) drug therapy: Secondary | ICD-10-CM

## 2010-10-17 LAB — PROTIME-INR: Prothrombin Time: 28.5 seconds — ABNORMAL HIGH (ref 11.6–15.2)

## 2010-10-17 LAB — BASIC METABOLIC PANEL
Calcium: 9 mg/dL (ref 8.4–10.5)
GFR calc Af Amer: >60 mL/min (ref >60)
GFR calc non Af Amer: >60 mL/min (ref >60)
Glucose, Bld: 95 mg/dL (ref 70–99)
Potassium: 4.5 mEq/L (ref 3.5–5.1)
Sodium: 142 mEq/L (ref 135–145)

## 2010-10-18 LAB — BASIC METABOLIC PANEL
BUN: 23 mg/dL (ref 6–23)
CO2: 30 mEq/L (ref 19–32)
Calcium: 9.4 mg/dL (ref 8.4–10.5)
Chloride: 105 mEq/L (ref 96–112)
Creatinine, Ser: 0.64 mg/dL (ref 0.50–1.10)

## 2010-10-18 LAB — PROTIME-INR
INR: 2.62 — ABNORMAL HIGH (ref 0.00–1.49)
Prothrombin Time: 28.4 seconds — ABNORMAL HIGH (ref 11.6–15.2)

## 2010-10-19 LAB — BASIC METABOLIC PANEL
CO2: 27 mEq/L (ref 19–32)
Calcium: 9 mg/dL (ref 8.4–10.5)
Creatinine, Ser: 0.61 mg/dL (ref 0.50–1.10)
Glucose, Bld: 92 mg/dL (ref 70–99)

## 2010-10-19 LAB — PROTIME-INR: INR: 2.23 — ABNORMAL HIGH (ref 0.00–1.49)

## 2010-10-20 ENCOUNTER — Ambulatory Visit: Payer: Medicare Other

## 2010-10-20 LAB — BASIC METABOLIC PANEL
BUN: 22 mg/dL (ref 6–23)
CO2: 32 mEq/L (ref 19–32)
Chloride: 105 mEq/L (ref 96–112)
Creatinine, Ser: 0.65 mg/dL (ref 0.50–1.10)

## 2010-10-23 ENCOUNTER — Telehealth: Payer: Self-pay | Admitting: Internal Medicine

## 2010-10-23 NOTE — Telephone Encounter (Signed)
Patient states she has been having  nauseas like  feeling since Tuesday night, also when she lays down to bed she can feel her heart beating irregular. Pt said she has had some ablations and cardioversion. She has some travel plans, and would like to know what is the next step. Pt will go to her PCP Dr. Milinda Antis at [facility]  And have an EKG in the morning to be fax to this office. Pt. Will call tomorrow when it is  done.

## 2010-10-23 NOTE — Telephone Encounter (Signed)
Pt thinks her heart is out of rhythm again and she wants to talk to someone about it because she is going out of town

## 2010-10-24 ENCOUNTER — Encounter (INDEPENDENT_AMBULATORY_CARE_PROVIDER_SITE_OTHER): Payer: Medicare Other

## 2010-10-24 ENCOUNTER — Encounter: Payer: Self-pay | Admitting: Internal Medicine

## 2010-10-24 ENCOUNTER — Ambulatory Visit (INDEPENDENT_AMBULATORY_CARE_PROVIDER_SITE_OTHER): Payer: Medicare Other | Admitting: Internal Medicine

## 2010-10-24 ENCOUNTER — Encounter: Payer: Self-pay | Admitting: *Deleted

## 2010-10-24 VITALS — BP 114/78 | HR 115 | Ht 65.0 in | Wt 157.0 lb

## 2010-10-24 DIAGNOSIS — I4891 Unspecified atrial fibrillation: Secondary | ICD-10-CM

## 2010-10-24 DIAGNOSIS — R0989 Other specified symptoms and signs involving the circulatory and respiratory systems: Secondary | ICD-10-CM

## 2010-10-24 NOTE — Patient Instructions (Signed)

## 2010-10-24 NOTE — Assessment & Plan Note (Signed)
She is in .rial fibrillation with a rapid ventricular response. We will plan cardioversion again next week. I not all that sanguine but the idea that sinus begets sinus makes thishour next easiest option. She does not recall why it is that she was thought to be amiodarone intolerant. This may be a reasonable alternative. This would also provide rate control and that she has had a cardiomyopathy in the past related to tachycardia this will be important. I will hold off on any rate control now because of sinus bradycardia

## 2010-10-24 NOTE — Progress Notes (Signed)
  HPI  [name] is a 77 y.o. female seen following an atrial fibrillation ablation undertaken at [facility] in March2010. The postprocedural interval was complicated by recurrent atrial arrhythmias prompting initiation of Tikosyn following cardioversion for atrial tachycardia. This was stopped last fall. About 6 months ago she noted increasing exercise intolerance. There has been some orthostatic lightheadedness and occasional palpitations  There has been no peripheral edema  Because of that we elected to reinitiate Tikosyn. She is admitted to hospital over the weekend and was cardioverted to sinus rhythm. She comes in now with complaint of of after a couple of days has been back out of rhythm.   Past Medical History  Diagnosis Date  . Junctional rhythm   . Cardiomyopathy     rate-related-resolved  . Pulmonary nodule   . Depression   . Asthma   . Allergic rhinitis   . FH: colonic polyps   . Hypothyroidism   . Carotid stenosis     mild (hosp 3/11)- consult by vasc/ Dr Arbie Cookey  . Atrial fibrillation     status post PCI [name] 2010 on Tikosyn     Past Surgical History  Procedure Date  . Mitral valve replacement   . Pilonidal cyst removal   . Colonoscopy 2001  . Exercise stress test 2/07  . Ct of abd and pelvis 10/07    negative  . Carotid doppler 10/07    no stenosis  . Gross hematuria on anticoagulants 10/07    neg urol work up   . Admit- syncope, bradycardia     cardiac and neuro work up  . Colonoscopy- polyps 8/08  . Tia 3/11    nl imaging and ech, with mod carotid stenosis (had vasc consul  . Appendectomy     Current Outpatient Prescriptions  Medication Sig Dispense Refill  . multivitamin (THERAGRAN) per tablet Take 1 tablet by mouth daily.        [name] warfarin (COUMADIN) 3 MG tablet TAKE ONE TABLET BY MOUTH AS DIRECTED  30 tablet  3    Allergies  Allergen Reactions  . Amiodarone Hcl     REACTION: Intolerance  . Penicillins     REACTION: rash  . Statins    REACTION: rash    Review of Systems negative except from HPI and PMH  Physical Exam Well developed and well nourished in no acute distress HENT normal E scleral and icterus clear Neck Supple JVP flat; carotids brisk and full Clear to ausculation Irregular rate and rhythm and somewhat fast Soft with active bowel sounds No clubbing cyanosis ; trace edema Alert and oriented, grossly normal motor and sensory function Skin Warm and Dry  ECG Atrial fibrillation at 106 Intervals-/0.09/0.32 with a QTC of 0.42  Assessment and  Plan

## 2010-10-24 NOTE — Telephone Encounter (Signed)
Pt came to the office today for an EKG. She was added to Dr. Odessa Fleming schedule.

## 2010-10-27 ENCOUNTER — Ambulatory Visit (HOSPITAL_COMMUNITY)
Admission: RE | Admit: 2010-10-27 | Discharge: 2010-10-27 | Disposition: A | Discharge status: Home / Self Care | Payer: Medicare Other | Source: Ambulatory Visit | Attending: Cardiology | Admitting: Cardiology

## 2010-10-27 DIAGNOSIS — I10 Essential (primary) hypertension: Secondary | ICD-10-CM | POA: Insufficient documentation

## 2010-10-27 DIAGNOSIS — J4489 Other specified chronic obstructive pulmonary disease: Secondary | ICD-10-CM | POA: Insufficient documentation

## 2010-10-27 DIAGNOSIS — Z01810 Encounter for preprocedural cardiovascular examination: Secondary | ICD-10-CM | POA: Insufficient documentation

## 2010-10-27 DIAGNOSIS — I4891 Unspecified atrial fibrillation: Secondary | ICD-10-CM | POA: Insufficient documentation

## 2010-10-27 DIAGNOSIS — J449 Chronic obstructive pulmonary disease, unspecified: Secondary | ICD-10-CM | POA: Insufficient documentation

## 2010-10-27 DIAGNOSIS — Z954 Presence of other heart-valve replacement: Secondary | ICD-10-CM | POA: Insufficient documentation

## 2010-10-31 NOTE — Op Note (Signed)
  NAMENILA, WINKER                 ACCOUNT NO.:  [id number]  MEDICAL RECORD NO.:  [id number]  LOCATION:  [facility]                         FACILITY:  [facility]  PHYSICIAN:  Madolyn Frieze. Jens Som, MD, FACCDATE OF BIRTH:  1932-11-15  DATE OF PROCEDURE:  10/27/2010 DATE OF DISCHARGE:  10/27/2010                              OPERATIVE REPORT   This is cardioversion of atrial fibrillation.  The patient was sedated by Anesthesia with Diprivan 50 mg intravenously.  Synchronized cardioversion with 120 joules resulted in sinus rhythm.  There were no immediate complications.  We would recommend continuing Coumadin and follow up with Dr. Graciela Husbands as scheduled.     Madolyn Frieze Jens Som, MD, [facility]     BSC/MEDQ  D:  10/27/2010  T:  10/28/2010  Job:  [id number]  Electronically Signed by Olga Millers MD [facility] on 10/31/2010 08:40:56 AM

## 2010-11-13 NOTE — Op Note (Signed)
  NAMEGLORYA, BARTLEY NO.:  [id number]  MEDICAL RECORD NO.:  [id number]  LOCATION:                                 FACILITY:  PHYSICIAN:  Hillis Range, MD       DATE OF BIRTH:  [birth year]  DATE OF PROCEDURE:  10/19/2010 DATE OF DISCHARGE:                              OPERATIVE REPORT   SURGEON:  Hillis Range, MD  PREPROCEDURE DIAGNOSIS:  Persistent atrial fibrillation.  POSTPROCEDURE DIAGNOSES:  Persistent atrial fibrillation.  PROCEDURE:  Cardioversion.  INTRODUCTION:  Ms. Amescua is a pleasant 77 year old female with a history of persistent atrial fibrillation.  She previously underwent atrial fibrillation ablation at [facility] in 2010.  She did well initially following ablation.  Unfortunately, she has felt recurrent symptomatic atrial fibrillation.  She has been admitted for Tikosyn load.  She has been adequately anticoagulated with Coumadin.  We will therefore proceed today with cardioversion.  DESCRIPTION OF PROCEDURE:  Informed written consent was obtained.  The patient was admitted to [facility].  She was observed to have persistent atrial fibrillation.  Adequate IV access and airway support were assured.  The patient was then adequately sedated with intravenous propofol as outlined in the anesthesia report.  She was then successfully cardioverted to sinus rhythm with a single synchronized 200- joule biphasic shock with cardioversion electrodes in the anterior- posterior thoracic configuration.  She remained in sinus bradycardia thereafter.  There were no early apparent complications.  CONCLUSIONS: 1. Successful cardioversion of persistent atrial fibrillation. 2. No early apparent complications please.     Hillis Range, MD     JA/MEDQ  D:  10/19/2010  T:  10/19/2010  Job:  [id number]  Electronically Signed by Hillis Range MD on 11/13/2010 09:42:14 AM

## 2010-11-17 NOTE — H&P (Signed)
NAMEBRANDYN, THIEN NO.:  [id number]  MEDICAL RECORD NO.:  [id number]  LOCATION:  3711                         FACILITY:  [facility]  PHYSICIAN:  [name] Salvia, MD, FACCDATE OF BIRTH:  Feb 20, 1933  DATE OF ADMISSION:  10/17/2010 DATE OF DISCHARGE:                             HISTORY & PHYSICAL   HISTORY OF PRESENT ILLNESS:  [name] is a 77 year old with a history of atrial fibrillation dating back a number of years.  She underwent flutter ablation and then pulmonary vein isolation at [facility] in March 2010.  She had postprocedural atrial fibrillation and Tikosyn was started.  After a year with no arrhythmia, her Tikosyn was discontinued. About 6 months ago, she has noted increasing exercise intolerance. There has been some lightheadedness and occasional palpitations.  There has been no peripheral edema.  She was seen in the office earlier this week and was found to be in atrial fibrillation.  She is now admitted for reinitiation of Tikosyn therapy.  PAST MEDICAL HISTORY: 1. Cardiomyopathy that was related with subsequent interval     improvement.  She has some degree of diastolic dysfunction. 2. Junctional rhythm. 3. Pulmonary nodule. 4. Depression. 5. Asthma. 6. Treated hypothyroidism. 7. Carotid stenosis. 8. Remote history of a TIA.  PAST SURGICAL HISTORY: 1. Mitral valve replacement. 2. Cyst removal. 3. Colonoscopy. 4. Appendectomy.  SOCIAL HISTORY:  She is widowed.  She has number of children who are involved in her care.  She does not use cigarettes or alcohol.  FAMILY HISTORY:  Negative for coronary artery disease.  Her mother is still alive at the age of 75 .  ALLERGIES: 1. AMIODARONE. 2. PENICILLIN. 3. STATINS.  CURRENT MEDICATIONS:  Only warfarin and recently initiated beta-blocker.  LABORATORY DATA:  Recent TSH was 1.72.  CBC demonstrated a white count of 3.7 with a hemoglobin of 13.  Metabolic profile was normal with potassium of  4.4 and magnesium was 2.5.  Echo in 2011 demonstrated normal left ventricular function with dilated right and left atria.  PHYSICAL EXAMINATION:  GENERAL:  She is an elderly Caucasian female appearing her stated age of 77.  She was in no acute distress. VITAL SIGNS:  Her blood pressure was 118/80, her pulse was 87, and respirations were 18. HEENT:  Normal. NECK:  Neck veins were flat.  Her carotids were brisk and full bilaterally without bruits. BACK:  Without kyphosis or scoliosis. LUNGS:  Clear. HEART:  Heart sounds were irregular without murmurs. ABDOMEN:  Soft with active bowel sounds. EXTREMITIES:  Femoral pulses were 2+ and distal pulses were intact. There was no clubbing, cyanosis, or edema. NEUROLOGICL:  Grossly normal. SKIN:  Warm and dry. PSYCHIATRIC:  Affect was engaging.  LABORATORY DATA:  As noted above.  Electrocardiogram is pending at time of this dictation.  IMPRESSION: 1. Atrial fibrillation - recurrent following pulmonary vein isolation     and postprocedural Tikosyn.  She is admitted now for Tikosynreinitiation. 2. History of resolved tachycardia-induced cardiomyopathy with     biatrial enlargement and status post mitral valve repair. 3. Thromboembolic risk factors notable for age - 2, gender - 1, prior     transient ischemic attack -  2. 4. Beta-blocker intolerance secondary to alopecia.  [name] is admitted for Tikosyn.  We will anticipate cardioversion on Sunday if she is not converted on her own.  We will follow her electrolytes while she is in hospital.  ADDENDUM:  The electrocardiogram is just obtained.  QT is about 380 milliseconds and QTc varies because of the RR intervals but is in the 440 range.  We will need to reassess it following restoration of sinus rhythm.     [name] Salvia, MD, [facility]     SCK/MEDQ  D:  10/17/2010  T:  10/18/2010  Job:  [id number]  Electronically Signed by Sherryl Manges MD [facility] on 11/17/2010 01:51:10 PM

## 2010-11-17 NOTE — Discharge Summary (Signed)
NAMEBLAISE, [name] NO.:  [id number]  MEDICAL RECORD NO.:  [id number]  LOCATION:  3711                         FACILITY:  [facility]  PHYSICIAN:  [name] Salvia, MD, FACCDATE OF BIRTH:  05/04/1932  DATE OF ADMISSION:  10/17/2010 DATE OF DISCHARGE:  10/20/2010                              DISCHARGE SUMMARY   PRIMARY CARDIOLOGIST:  [name] Salvia, MD, [facility]  PRIMARY CARE PHYSICIAN:  Marne A. Tower, MD  DISCHARGE DIAGNOSIS:  Recurrent atrial fibrillation.  SECONDARY DIAGNOSES: 1. History of atrial flutter status post pulmonary vein isolation,     [name], 2010. 2. History of postprocedural atrial fibrillation. 3. History of nonischemic cardiomyopathy. 4. Diastolic dysfunction. 5. History of junctional rhythm. 6. History pulmonary nodule. 7. Depression. 8. Asthma. 9. Hypothyroidism. 10.History of carotid stenosis. 11.Remote history of transient ischemic attack. 12.Status post mitral valve replacement. 13.Status post cyst removal. 14.Status post colonoscopy. 15.Status post appendectomy.  ALLERGIES: 1. AMIODARONE. 2. PENICILLIN. 3. STATIN.  PROCEDURES:  Successful cardioversion of persistent atrial fibrillation performed on October 19, 2010.  HISTORY OF PRESENT ILLNESS:  This is a 77 year old female with history of atrial fibrillation dating back a number of years.  She underwent A- flutter ablation and pulmonary vein isolation in March 2010 at [facility] and developed postprocedural atrial fibrillation and was placed on Tikosyn therapy.  After year with no arrhythmia, her Tikosyn was discontinued.  Unfortunately about 6 months ago, she began to experience increasing exercise intolerance and was recently found to be in atrial fibrillation.  Decision was made to pursue reinitiation of Tikosyn therapy.  HOSPITAL COURSE:  The patient presented to [facility] on October 17, 2010 for elective Tikosyn initiation.  Baseline QTc measured in the 440 range and  baseline creatinine clearance 77 mL per minute, the patient was placed on 500 mcg of Tikosyn q.12 hours.  The patient has tolerated Tikosyn initiation well and her QTc has remained relatively stable.  Unfortunately, she did not convert to sinus rhythm on Tikosyn alone and therefore on October 19, 2010, she underwent successful bedside cardioversion with use of a single synchronized 200- joule biphasic shock.  She tolerated this procedure well and has remained in sinus rhythm post cardioversion.  She is discharged home today in good condition.  DISCHARGE LABORATORY DATA:  INR 2.13.  Sodium 141, potassium 4.3, chloride 105, CO2 32, BUN 22, creatinine 0.65, glucose 97, calcium 9.0, and magnesium 2.3.  DISPOSITION:  The patient will be discharged home today in good condition.  FOLLOWUP PLANS AND APPOINTMENTS:  The patient will follow with Dr. Graciela Husbands on December 16, 2010 at 12:00 p.m.  She will follow up with Dr. Milinda Antis as previously scheduled and will also have Coumadin followup as previously scheduled.  DISCHARGE MEDICATIONS: 1. Tikosyn 500 mcg q.12 h. 2. Coumadin 5 mg 1-1/2 tablets on Monday and Thursday and 1 tablet all     other days.  OUTSTANDING LABORATORY STUDIES:  None.  DURATION OF DISCHARGE ENCOUNTER:  45 minutes including physician time.     [name], ANP   ______________________________ [name] Salvia, MD, [facility]    CB/MEDQ  D:  10/20/2010  T:  10/20/2010  Job:  [id number]  cc:   Marne A. Milinda Antis, MD  Electronically Signed by [name] ANP on 10/21/2010 04:54:09 PM Electronically Signed by Sherryl Manges MD [facility] on 11/17/2010 01:51:07 PM

## 2010-11-18 ENCOUNTER — Encounter: Payer: Medicare Other | Admitting: Physician Assistant

## 2010-11-20 ENCOUNTER — Ambulatory Visit (INDEPENDENT_AMBULATORY_CARE_PROVIDER_SITE_OTHER): Payer: Medicare Other | Admitting: Family Medicine

## 2010-11-20 DIAGNOSIS — Z5181 Encounter for therapeutic drug level monitoring: Secondary | ICD-10-CM

## 2010-11-20 DIAGNOSIS — I4891 Unspecified atrial fibrillation: Secondary | ICD-10-CM

## 2010-11-20 DIAGNOSIS — Z7901 Long term (current) use of anticoagulants: Secondary | ICD-10-CM

## 2010-11-20 LAB — POCT INR: INR: 2.2

## 2010-11-24 ENCOUNTER — Other Ambulatory Visit: Payer: Self-pay | Admitting: Family Medicine

## 2010-12-16 ENCOUNTER — Encounter: Payer: Self-pay | Admitting: Internal Medicine

## 2010-12-16 ENCOUNTER — Ambulatory Visit (INDEPENDENT_AMBULATORY_CARE_PROVIDER_SITE_OTHER): Payer: Medicare Other | Admitting: Internal Medicine

## 2010-12-16 VITALS — BP 132/74 | HR 59 | Ht 65.0 in | Wt 158.0 lb

## 2010-12-16 DIAGNOSIS — I4891 Unspecified atrial fibrillation: Secondary | ICD-10-CM

## 2010-12-16 DIAGNOSIS — E785 Hyperlipidemia, unspecified: Secondary | ICD-10-CM

## 2010-12-16 NOTE — Progress Notes (Signed)
  HPI  [name] is a 77 y.o. female seen following an atrial fibrillation ablation undertaken at [facility] in March2010. The postprocedural interval was complicated by recurrent atrial arrhythmias prompting initiation of Tikosyn following cardioversion for atrial tachycardia. This was stopped last fall. About 6 months ago she noted increasing exercise intolerance. There has been some orthostatic lightheadedness and occasional palpitations  There has been no peripheral edema  Because of that we elected to reinitiate Tikosyn. She was admitted in August and cardioverted. She reverted to atrial fibrillation and was cardioverted again. For the most part she has remained in sinus rhythm since then  Past Medical History  Diagnosis Date  . Junctional rhythm   . Cardiomyopathy     rate-related-resolved  . Pulmonary nodule   . Depression   . Asthma   . Allergic rhinitis   . FH: colonic polyps   . Hypothyroidism   . Carotid stenosis     mild (hosp 3/11)- consult by vasc/ Dr Arbie Cookey  . Atrial fibrillation     status post PCI [name] 2010 on Tikosyn     Past Surgical History  Procedure Date  . Mitral valve replacement   . Pilonidal cyst removal   . Colonoscopy 2001  . Exercise stress test 2/07  . Ct of abd and pelvis 10/07    negative  . Carotid doppler 10/07    no stenosis  . Gross hematuria on anticoagulants 10/07    neg urol work up   . Admit- syncope, bradycardia     cardiac and neuro work up  . Colonoscopy- polyps 8/08  . Tia 3/11    nl imaging and ech, with mod carotid stenosis (had vasc consul  . Appendectomy     Current Outpatient Prescriptions  Medication Sig Dispense Refill  . multivitamin (THERAGRAN) per tablet Take 1 tablet by mouth daily.        [name] warfarin (COUMADIN) 3 MG tablet TAKE ONE TABLET BY MOUTH AS DIRECTED  30 tablet  3    Allergies  Allergen Reactions  . Amiodarone Hcl     REACTION: Intolerance  . Penicillins     REACTION: rash  . Statins     REACTION:  rash    Review of Systems negative except from HPI and PMH  Physical Exam Well developed and well nourished in no acute distress HENT normal E scleral and icterus clear Neck Supple JVP flat; carotids brisk and full Clear to ausculation Regular rate and rhythm without murmurs or gallops Soft with active bowel sounds No clubbing cyanosis ; trace edema Alert and oriented, grossly normal motor and sensory function Skin Warm and Dry  ECG  Sinus rhythm at 59 Interval 0.20/0.09/0.45 Axis is -25  Assessment and  Plan

## 2010-12-16 NOTE — Assessment & Plan Note (Signed)
Holding sinus rhythm on Tikosyn. QT Interval is acceptable

## 2010-12-16 NOTE — Patient Instructions (Signed)
Your physician wants you to follow-up in: 6 months  You will receive a reminder letter in the mail two months in advance. If you don't receive a letter, please call our office to schedule the follow-up appointment.  Your physician recommends that you continue on your current medications as directed. Please refer to the Current Medication list given to you today.  

## 2010-12-16 NOTE — Assessment & Plan Note (Signed)
Elevated LDL is noted. We'll defer management to Dr. Milinda Antis. In the absence of coronary artery disease, I would not be all that aggressive about treating her hypercholesterolemia.

## 2010-12-18 ENCOUNTER — Ambulatory Visit: Payer: Medicare Other

## 2010-12-19 ENCOUNTER — Ambulatory Visit (INDEPENDENT_AMBULATORY_CARE_PROVIDER_SITE_OTHER): Payer: Medicare Other | Admitting: Family Medicine

## 2010-12-19 DIAGNOSIS — I4891 Unspecified atrial fibrillation: Secondary | ICD-10-CM

## 2010-12-19 DIAGNOSIS — Z7901 Long term (current) use of anticoagulants: Secondary | ICD-10-CM

## 2010-12-19 DIAGNOSIS — Z5181 Encounter for therapeutic drug level monitoring: Secondary | ICD-10-CM

## 2010-12-19 LAB — POCT INR: INR: 3.7

## 2010-12-19 NOTE — Patient Instructions (Signed)
hold 1 dose then Continue 5 mg daily, 7.5 mg sun, thurs, recheck 1 week.

## 2010-12-22 ENCOUNTER — Ambulatory Visit: Payer: Medicare Other

## 2010-12-26 ENCOUNTER — Ambulatory Visit: Payer: Medicare Other

## 2011-01-19 ENCOUNTER — Ambulatory Visit (INDEPENDENT_AMBULATORY_CARE_PROVIDER_SITE_OTHER): Payer: Medicare Other | Admitting: Family Medicine

## 2011-01-19 DIAGNOSIS — Z7901 Long term (current) use of anticoagulants: Secondary | ICD-10-CM

## 2011-01-19 DIAGNOSIS — I4891 Unspecified atrial fibrillation: Secondary | ICD-10-CM

## 2011-01-19 DIAGNOSIS — Z5181 Encounter for therapeutic drug level monitoring: Secondary | ICD-10-CM

## 2011-01-19 LAB — POCT INR: INR: 2.3

## 2011-01-19 NOTE — Patient Instructions (Signed)
5 mg daily ( reduced dose by 5 mg) recheck in 2 weeks

## 2011-01-20 ENCOUNTER — Encounter: Payer: Self-pay | Admitting: Family Medicine

## 2011-01-21 ENCOUNTER — Ambulatory Visit (INDEPENDENT_AMBULATORY_CARE_PROVIDER_SITE_OTHER): Payer: Medicare Other | Admitting: Family Medicine

## 2011-01-21 ENCOUNTER — Encounter: Payer: Self-pay | Admitting: Family Medicine

## 2011-01-21 VITALS — BP 110/64 | HR 72 | Temp 98.1°F | Ht 65.0 in | Wt 160.2 lb

## 2011-01-21 DIAGNOSIS — J029 Acute pharyngitis, unspecified: Secondary | ICD-10-CM | POA: Insufficient documentation

## 2011-01-21 NOTE — Assessment & Plan Note (Signed)
Without other symptoms- suspect chemical irritation from recent exp to bug spray in combination with dust  Nl exam- reassuring Recommend salt water gargles inst to call if fever/worse or other symptoms Thankfully no wheeze or resp symptoms

## 2011-01-21 NOTE — Progress Notes (Signed)
Subjective:    Patient ID: [name], female    DOB: Dec 18, 1932, 77 y.o.   MRN: [id number]  HPI Sore throat after she used bug spray  Was at her mother's house and used a lot of bug spray - on oct 23rd Raid -- spraying it everywhere and overdid it - indoors and also cleaning up the shed , and very dusty   A day or so later - got some raspy voice and throat spray Using herbal supplement for ST- anti infammatory (zango) Is just not going away  No cough or wheeze  Feeling ear a bit   A little tired  No fever   Patient Active Problem List  Diagnoses  . ADENOMATOUS COLONIC POLYP  . HYPOTHYROIDISM NOS  . HYPERLIPIDEMIA  . OBESITY  . DEPRESSION  . ATRIAL FIBRILLATION  . URI  . ALLERGIC RHINITIS  . ASTHMA  . PULMONARY NODULE  . RECTAL BLEEDING  . MENOPAUSE-RELATED VASOMOTOR SYMPTOMS  . NECK PAIN  . GANGLION CYST  . INSOMNIA  . URINARY INCONTINENCE  . ABDOMINAL PAIN  . COLONIC POLYPS, ADENOMATOUS, HX OF  . Cardiomyopathy  Tachycardia-resolved  . Sore throat   Past Medical History  Diagnosis Date  . TIA (transient ischemic attack)     facial numbness 2010  . Cardiomyopathy     rate-related-resolved  . Atrial fibrillation     s/p PVI [name] 2010  . History of mitral valve repair   . FH: colonic polyps   . Hypothyroidism   . Carotid stenosis     mild (hosp 3/11)- consult by vasc/ Dr Arbie Cookey  . Pulmonary nodule   . Depression   . Asthma   . Allergy     allergic rhinitis   Past Surgical History  Procedure Date  . Mitral valve replacement   . Pilonidal cyst removal   . Colonoscopy 2001  . Exercise stress test 2/07  . Ct of abd and pelvis 10/07    negative  . Carotid doppler 10/07    no stenosis  . Gross hematuria on anticoagulants 10/07    neg urol work up   . Admit- syncope, bradycardia     cardiac and neuro work up  . Colonoscopy- polyps 8/08  . Tia 3/11    nl imaging and ech, with mod carotid stenosis (had vasc consul  . Appendectomy    History    Substance Use Topics  . Smoking status: Never Smoker   . Smokeless tobacco: Not on file  . Alcohol Use: No   Family History  Problem Relation Age of Onset  . Hypertension Mother   . Lung cancer Father     smoker  . Alcohol abuse Father   . Cancer Father     bladder and lung CA smoker   Allergies  Allergen Reactions  . Amiodarone Hcl     REACTION: Intolerance  . Penicillins     REACTION: rash  . Statins     REACTION: rash   Current Outpatient Prescriptions on File Prior to Visit  Medication Sig Dispense Refill  . dofetilide (TIKOSYN) 500 MCG capsule Take 500 mcg by mouth 2 (two) times daily.        . multivitamin (THERAGRAN) per tablet Take 1 tablet by mouth daily.        [name] warfarin (COUMADIN) 1 MG tablet TAKE BY MOUTH AS DIRECTED  30 tablet  3  . warfarin (COUMADIN) 4 MG tablet TAKE ONE TABLET BY MOUTH EVERY DAY AS DIRECTED  30 tablet  3  . warfarin (COUMADIN) 3 MG tablet TAKE ONE TABLET BY MOUTH AS DIRECTED  30 tablet  3        Review of Systems Review of Systems  Constitutional: Negative for fever, appetite change, fatigue and unexpected weight change.  Eyes: Negative for pain and visual disturbance.  ENT pos for raw throat- neg for throat swelling or difficulty swallowing, some mild runny nose  Respiratory: Negative for cough and shortness of breath.   Cardiovascular: Negative for cp or palpitations    Gastrointestinal: Negative for nausea, diarrhea and constipation.  Genitourinary: Negative for urgency and frequency.  Skin: Negative for pallor or rash   Neurological: Negative for weakness, light-headedness, numbness and headaches.  Hematological: Negative for adenopathy. Does not bruise/bleed easily.  Psychiatric/Behavioral: Negative for dysphoric mood. The patient is not nervous/anxious.          Objective:   Physical Exam  Constitutional: She appears well-developed and well-nourished. No distress.  HENT:  Head: Normocephalic and atraumatic.  Right Ear:  External ear normal.  Left Ear: External ear normal.  Nose: Nose normal.  Mouth/Throat: Oropharynx is clear and moist. No oropharyngeal exudate.       Nl appearing throat No erythema/ swelling / exudate or lesions   Eyes: Conjunctivae and EOM are normal. Pupils are equal, round, and reactive to light. No scleral icterus.  Neck: Normal range of motion. Neck supple. No JVD present. Carotid bruit is not present. No thyromegaly present.  Cardiovascular: Normal rate, regular rhythm and normal heart sounds.   Pulmonary/Chest: Effort normal and breath sounds normal. No respiratory distress. She has no wheezes.  Musculoskeletal: She exhibits no edema.  Lymphadenopathy:    She has no cervical adenopathy.  Neurological: She is alert.  Skin: Skin is warm and dry. No rash noted. No erythema. No pallor.  Psychiatric: She has a normal mood and affect.          Assessment & Plan:

## 2011-01-21 NOTE — Patient Instructions (Signed)
I think you have some chemical throat irritation (also possibly dust/ allergies)  Your exam is reassuring Let me know if severe sore throat or fever  Gargle with salt water as often as you need it  Keep me updated

## 2011-01-30 ENCOUNTER — Ambulatory Visit: Payer: Medicare Other

## 2011-02-03 ENCOUNTER — Ambulatory Visit (INDEPENDENT_AMBULATORY_CARE_PROVIDER_SITE_OTHER): Payer: Medicare Other | Admitting: Family Medicine

## 2011-02-03 DIAGNOSIS — I4891 Unspecified atrial fibrillation: Secondary | ICD-10-CM

## 2011-02-03 DIAGNOSIS — Z7901 Long term (current) use of anticoagulants: Secondary | ICD-10-CM

## 2011-02-03 DIAGNOSIS — Z5181 Encounter for therapeutic drug level monitoring: Secondary | ICD-10-CM

## 2011-02-03 NOTE — Patient Instructions (Signed)
Continue current dose, check in 4 weeks  

## 2011-03-03 ENCOUNTER — Ambulatory Visit: Payer: Medicare Other

## 2011-03-09 ENCOUNTER — Ambulatory Visit: Payer: Medicare Other

## 2011-03-10 ENCOUNTER — Ambulatory Visit (INDEPENDENT_AMBULATORY_CARE_PROVIDER_SITE_OTHER): Payer: Medicare Other | Admitting: Family Medicine

## 2011-03-10 DIAGNOSIS — Z5181 Encounter for therapeutic drug level monitoring: Secondary | ICD-10-CM

## 2011-03-10 DIAGNOSIS — I4891 Unspecified atrial fibrillation: Secondary | ICD-10-CM

## 2011-03-10 DIAGNOSIS — Z7901 Long term (current) use of anticoagulants: Secondary | ICD-10-CM

## 2011-03-10 NOTE — Patient Instructions (Signed)
Continue current dose, check in 4 weeks  

## 2011-03-22 ENCOUNTER — Other Ambulatory Visit: Payer: Self-pay | Admitting: Family Medicine

## 2011-03-23 NOTE — Telephone Encounter (Signed)
Is it okay to refill medication? 

## 2011-03-23 NOTE — Telephone Encounter (Signed)
Will refill electronically  

## 2011-04-07 ENCOUNTER — Ambulatory Visit: Payer: Medicare Other

## 2011-04-10 ENCOUNTER — Other Ambulatory Visit: Payer: Self-pay | Admitting: Family Medicine

## 2011-04-16 ENCOUNTER — Ambulatory Visit (INDEPENDENT_AMBULATORY_CARE_PROVIDER_SITE_OTHER): Payer: Medicare Other | Admitting: Family Medicine

## 2011-04-16 DIAGNOSIS — I4891 Unspecified atrial fibrillation: Secondary | ICD-10-CM

## 2011-04-16 DIAGNOSIS — Z7901 Long term (current) use of anticoagulants: Secondary | ICD-10-CM

## 2011-04-16 DIAGNOSIS — Z5181 Encounter for therapeutic drug level monitoring: Secondary | ICD-10-CM | POA: Diagnosis not present

## 2011-04-16 NOTE — Patient Instructions (Signed)
Continue current dose, check in 4 weeks  

## 2011-05-14 ENCOUNTER — Ambulatory Visit: Payer: Medicare Other

## 2011-05-18 ENCOUNTER — Ambulatory Visit (INDEPENDENT_AMBULATORY_CARE_PROVIDER_SITE_OTHER): Payer: Medicare Other | Admitting: Family Medicine

## 2011-05-18 DIAGNOSIS — Z7901 Long term (current) use of anticoagulants: Secondary | ICD-10-CM | POA: Diagnosis not present

## 2011-05-18 DIAGNOSIS — Z5181 Encounter for therapeutic drug level monitoring: Secondary | ICD-10-CM

## 2011-05-18 DIAGNOSIS — I4891 Unspecified atrial fibrillation: Secondary | ICD-10-CM

## 2011-05-18 NOTE — Patient Instructions (Signed)
Continue current dose, check in 4 weeks  

## 2011-05-25 ENCOUNTER — Ambulatory Visit (INDEPENDENT_AMBULATORY_CARE_PROVIDER_SITE_OTHER): Payer: Medicare Other | Admitting: Family Medicine

## 2011-05-25 ENCOUNTER — Encounter: Payer: Self-pay | Admitting: Family Medicine

## 2011-05-25 VITALS — BP 112/62 | HR 64 | Temp 98.3°F | Wt 166.0 lb

## 2011-05-25 DIAGNOSIS — J069 Acute upper respiratory infection, unspecified: Secondary | ICD-10-CM | POA: Diagnosis not present

## 2011-05-25 MED ORDER — AZITHROMYCIN 250 MG PO TABS: ORAL_TABLET | ORAL | Status: AC

## 2011-05-25 NOTE — Progress Notes (Signed)
Subjective:    Patient ID: [name], female    DOB: 11-Aug-1932, 78 y.o.   MRN: [id number]  HPI 3rd uri since October Uses saline rinses and zinc loseng  Last one was about 3 weeks ago  Starts sneezing  (not allergies) then sore throat (worse on R side ) and then nose drippy - lasts 2-3 weeks and nasal congestion   24 hours ago - started with cough and drippy nose and burning in R hand side of throat  Felt hot and feverish (hot)--no fever now  But no chills or aches  Bad taste in mouth  Woke up with a headache  Did turn her heat down  Blowing yellow / green  No facial pain - but wakes up with headache over eyes  Cough - just a little phlegm- green to clear   Patient Active Problem List  Diagnoses  . ADENOMATOUS COLONIC POLYP  . HYPOTHYROIDISM NOS  . HYPERLIPIDEMIA  . OBESITY  . DEPRESSION  . ATRIAL FIBRILLATION  . URI  . ALLERGIC RHINITIS  . ASTHMA  . PULMONARY NODULE  . RECTAL BLEEDING  . MENOPAUSE-RELATED VASOMOTOR SYMPTOMS  . NECK PAIN  . GANGLION CYST  . INSOMNIA  . URINARY INCONTINENCE  . ABDOMINAL PAIN  . COLONIC POLYPS, ADENOMATOUS, HX OF  . Cardiomyopathy  Tachycardia-resolved  . Sore throat  . Multiple URI   Past Medical History  Diagnosis Date  . TIA (transient ischemic attack)     facial numbness 2010  . Cardiomyopathy     rate-related-resolved  . Atrial fibrillation     s/p PVI [name] 2010  . History of mitral valve repair   . FH: colonic polyps   . Hypothyroidism   . Carotid stenosis     mild (hosp 3/11)- consult by vasc/ Dr Arbie Cookey  . Pulmonary nodule   . Depression   . Asthma   . Allergy     allergic rhinitis   Past Surgical History  Procedure Date  . Mitral valve replacement   . Pilonidal cyst removal   . Colonoscopy 2001  . Exercise stress test 2/07  . Ct of abd and pelvis 10/07    negative  . Carotid doppler 10/07    no stenosis  . Gross hematuria on anticoagulants 10/07    neg urol work up   . Admit- syncope, bradycardia       cardiac and neuro work up  . Colonoscopy- polyps 8/08  . Tia 3/11    nl imaging and ech, with mod carotid stenosis (had vasc consul  . Appendectomy    History  Substance Use Topics  . Smoking status: Never Smoker   . Smokeless tobacco: Never Used  . Alcohol Use: Yes     occasional glass of winre   Family History  Problem Relation Age of Onset  . Hypertension Mother   . Lung cancer Father     smoker  . Alcohol abuse Father   . Cancer Father     bladder and lung CA smoker   Allergies  Allergen Reactions  . Amiodarone Hcl     REACTION: Intolerance  . Penicillins     REACTION: rash  . Statins     REACTION: rash   Current Outpatient Prescriptions on File Prior to Visit  Medication Sig Dispense Refill  . dofetilide (TIKOSYN) 500 MCG capsule Take 500 mcg by mouth 2 (two) times daily.        . multivitamin (THERAGRAN) per tablet Take 1 tablet  by mouth daily.        [name] warfarin (COUMADIN) 1 MG tablet TAKE AS DIRECTED  30 tablet  3  . warfarin (COUMADIN) 3 MG tablet TAKE ONE TABLET BY MOUTH AS DIRECTED  30 tablet  3  . warfarin (COUMADIN) 4 MG tablet TAKE ONE TABLET BY MOUTH EVERY DAY AS DIRECTED  30 tablet  5           Review of Systems Review of Systems  Constitutional: Negative for fever, appetite change, and unexpected weight change. fever is better, pos for fatigue Eyes: Negative for pain and visual disturbance.  ENT pos for cong/ rhinorrhea/ facial pain and purulent nasal d/c Respiratory: Negative for sob or wheeze   Cardiovascular: Negative for cp or palpitations    Gastrointestinal: Negative for nausea, diarrhea and constipation.  Genitourinary: Negative for urgency and frequency.  Skin: Negative for pallor or rash   Neurological: Negative for weakness, light-headedness, numbness and headaches.  Hematological: Negative for adenopathy. Does not bruise/bleed easily.  Psychiatric/Behavioral: Negative for dysphoric mood. The patient is not nervous/anxious.           Objective:   Physical Exam  Constitutional: She appears well-developed and well-nourished. No distress.  HENT:  Head: Normocephalic and atraumatic.  Right Ear: External ear normal.  Left Ear: External ear normal.  Mouth/Throat: Oropharynx is clear and moist. No oropharyngeal exudate.       Nares are injected and congested  Throat- post nasal drip  bilat ethmoid and maxillary sinus tenderness   Eyes: EOM are normal. Pupils are equal, round, and reactive to light. Right eye exhibits no discharge. Left eye exhibits no discharge.  Neck: Normal range of motion. Neck supple. No JVD present. No thyromegaly present.  Cardiovascular: Normal rate, regular rhythm and normal heart sounds.   Pulmonary/Chest: Effort normal and breath sounds normal. No respiratory distress. She has no wheezes. She has no rales.  Musculoskeletal: She exhibits no edema.  Lymphadenopathy:    She has no cervical adenopathy.  Neurological: She is alert. She has normal reflexes.  Skin: Skin is warm and dry. No rash noted. No erythema.  Psychiatric: She has a normal mood and affect.          Assessment & Plan:

## 2011-05-25 NOTE — Assessment & Plan Note (Signed)
Recurrent uri vs early bacterial sinusitis? Disc in length Rev sympt If worse (rev red flags)will fill px for zpak (on coumadin and pcn all)- and update  Will have to check INR closely  Update if not starting to improve in a week or if worsening

## 2011-05-25 NOTE — Patient Instructions (Signed)
I'm unsure if you are catching another viral cold or starting early with a sinus infection Keep doing what you are for symptoms Drink lots of fluids and get rest when you can  If you develop sinus pain/ tenderness/ fever/ and continue to have green / yellow drainage -- fill and take the zithromax  If you do start it - let us know so we can follow protime more closely  I hope you feel better soon

## 2011-06-11 ENCOUNTER — Other Ambulatory Visit: Payer: Self-pay | Admitting: Cardiovascular Disease

## 2011-06-15 ENCOUNTER — Ambulatory Visit (INDEPENDENT_AMBULATORY_CARE_PROVIDER_SITE_OTHER): Payer: Medicare Other | Admitting: Family Medicine

## 2011-06-15 ENCOUNTER — Ambulatory Visit (INDEPENDENT_AMBULATORY_CARE_PROVIDER_SITE_OTHER): Payer: Medicare Other | Admitting: Internal Medicine

## 2011-06-15 ENCOUNTER — Other Ambulatory Visit: Payer: Self-pay | Admitting: Cardiovascular Disease

## 2011-06-15 VITALS — BP 124/76 | HR 78 | Ht 65.0 in | Wt 173.0 lb

## 2011-06-15 DIAGNOSIS — Z7901 Long term (current) use of anticoagulants: Secondary | ICD-10-CM

## 2011-06-15 DIAGNOSIS — I4891 Unspecified atrial fibrillation: Secondary | ICD-10-CM

## 2011-06-15 DIAGNOSIS — Z5181 Encounter for therapeutic drug level monitoring: Secondary | ICD-10-CM | POA: Diagnosis not present

## 2011-06-15 LAB — BASIC METABOLIC PANEL
BUN: 15 mg/dL (ref 6–23)
Calcium: 9 mg/dL (ref 8.4–10.5)
Creatinine, Ser: 0.6 mg/dL (ref 0.4–1.2)

## 2011-06-15 LAB — POCT INR: INR: 2.3

## 2011-06-15 MED ORDER — DOFETILIDE 500 MCG PO CAPS: 500.0000 ug | ORAL_CAPSULE | Freq: Two times a day (BID) | ORAL | Status: DC

## 2011-06-15 NOTE — Patient Instructions (Signed)
Continue current dose, check in 4 weeks  

## 2011-06-15 NOTE — Assessment & Plan Note (Signed)
Holding sinus rhythm on dofetilide. QT interval is acceptable. We will check a potassium and magnesium today.

## 2011-06-15 NOTE — Progress Notes (Signed)
  HPI  [name] is a 78 y.o. female seen following an atrial fibrillation ablation undertaken at [facility] in March 2010. The postprocedural interval was complicated by recurrent atrial arrhythmias prompting initiation of Tikosyn following cardioversion for atrial tachycardia. This was stopped last fall. About 6 months ago she noted increasing exercise intolerance. There has been some orthostatic lightheadedness and occasional palpitations  There has been no peripheral edema  Because of that we elected to reinitiate Tikosyn. She was admitted in August and cardioverted. She reverted to atrial fibrillation and was cardioverted again. For the most part she has remained in sinus rhythm since then and has been doing well  Her major struggle is with her 35 yrold mom  Past Medical History  Diagnosis Date  . Junctional rhythm   . Cardiomyopathy     rate-related-resolved  . Pulmonary nodule   . Depression   . Asthma   . Allergic rhinitis   . FH: colonic polyps   . Hypothyroidism   . Carotid stenosis     mild (hosp 3/11)- consult by vasc/ Dr Arbie Cookey  . Atrial fibrillation     status post PCI [name] 2010 on Tikosyn     Past Surgical History  Procedure Date  . Mitral valve replacement   . Pilonidal cyst removal   . Colonoscopy 2001  . Exercise stress test 2/07  . Ct of abd and pelvis 10/07    negative  . Carotid doppler 10/07    no stenosis  . Gross hematuria on anticoagulants 10/07    neg urol work up   . Admit- syncope, bradycardia     cardiac and neuro work up  . Colonoscopy- polyps 8/08  . Tia 3/11    nl imaging and ech, with mod carotid stenosis (had vasc consul  . Appendectomy     Current Outpatient Prescriptions  Medication Sig Dispense Refill  . multivitamin (THERAGRAN) per tablet Take 1 tablet by mouth daily.        [name] warfarin (COUMADIN) 3 MG tablet TAKE ONE TABLET BY MOUTH AS DIRECTED  30 tablet  3    Allergies  Allergen Reactions  . Amiodarone Hcl     REACTION:  Intolerance  . Penicillins     REACTION: rash  . Statins     REACTION: rash    Review of Systems negative except from HPI and PMH  Physical Exam Well developed and well nourished in no acute distress HENT normal E scleral and icterus clear Neck Supple JVP flat; carotids brisk and full Clear to ausculation Regular rate and rhythm without murmurs or gallops Soft with active bowel sounds No clubbing cyanosis ; trace edema Alert and oriented, grossly normal motor and sensory function Skin Warm and Dry  ECG  Sinus rhythm at 59 Interval . 14/09/42 Axis 6 Incomplete right bundle branch block  Assessment and  Plan

## 2011-06-15 NOTE — Patient Instructions (Signed)
Your physician recommends that you have lab work today: bmp/magnesium/crp  Your physician wants you to follow-up in: 6 months with Dr. Graciela Husbands. You will receive a reminder letter in the mail two months in advance. If you don't receive a letter, please call our office to schedule the follow-up appointment.

## 2011-06-16 LAB — MAGNESIUM: Magnesium: 2 mg/dL (ref 1.5–2.5)

## 2011-06-16 LAB — C-REACTIVE PROTEIN: CRP: 0.26 mg/dL (ref <0.60)

## 2011-06-25 ENCOUNTER — Other Ambulatory Visit: Payer: Self-pay | Admitting: *Deleted

## 2011-06-25 DIAGNOSIS — I4891 Unspecified atrial fibrillation: Secondary | ICD-10-CM

## 2011-06-25 DIAGNOSIS — Z79899 Other long term (current) drug therapy: Secondary | ICD-10-CM

## 2011-06-29 NOTE — Progress Notes (Signed)
Addended by: Judithe Modest D on: 06/29/2011 09:55 AM   Modules accepted: Orders

## 2011-07-13 ENCOUNTER — Ambulatory Visit (INDEPENDENT_AMBULATORY_CARE_PROVIDER_SITE_OTHER): Payer: Medicare Other | Admitting: Family Medicine

## 2011-07-13 DIAGNOSIS — Z79899 Other long term (current) drug therapy: Secondary | ICD-10-CM | POA: Diagnosis not present

## 2011-07-13 DIAGNOSIS — I4891 Unspecified atrial fibrillation: Secondary | ICD-10-CM

## 2011-07-13 DIAGNOSIS — Z5181 Encounter for therapeutic drug level monitoring: Secondary | ICD-10-CM | POA: Diagnosis not present

## 2011-07-13 DIAGNOSIS — Z7901 Long term (current) use of anticoagulants: Secondary | ICD-10-CM | POA: Diagnosis not present

## 2011-07-13 LAB — BASIC METABOLIC PANEL
BUN: 20 mg/dL (ref 6–23)
CO2: 28 mEq/L (ref 19–32)
Calcium: 9.3 mg/dL (ref 8.4–10.5)
Creatinine, Ser: 0.6 mg/dL (ref 0.4–1.2)

## 2011-07-13 LAB — POCT INR: INR: 2

## 2011-07-13 NOTE — Patient Instructions (Signed)
Continue current dose, check in 4 weeks  

## 2011-07-16 ENCOUNTER — Other Ambulatory Visit: Payer: Medicare Other

## 2011-07-16 ENCOUNTER — Other Ambulatory Visit: Payer: Self-pay

## 2011-07-16 MED ORDER — AZITHROMYCIN 250 MG PO TABS: ORAL_TABLET | ORAL | Status: AC

## 2011-07-16 NOTE — Telephone Encounter (Signed)
Pt seen 05/25/11 and was to hold Z pack prescription if needed. Pt said she did not need antibiotic in March but she has chest congestion now with productive cough with green phlegm and some drainage at back of throat. No sorethroat, no fever. Pt has misplaced Z pack prescription and wants Z pack called in to [facility]. Pt can be reached at [phone] and if no answer may leave detailed message.

## 2011-07-16 NOTE — Telephone Encounter (Signed)
Will refill electronically  If she finds the other original one she needs to bring it back so we can shred it -- I am a little uncomfortable with lost px floating around  Thanks  This can affect her coumadin so she needs INR early next week  F/u if not improved

## 2011-07-16 NOTE — Telephone Encounter (Signed)
Left message on cell phone voicemail for patient to return call. 

## 2011-07-16 NOTE — Telephone Encounter (Signed)
Patient advised as instructed via telephone, lab appt scheduled for 07/22/2011 for PT/INR.

## 2011-07-22 ENCOUNTER — Ambulatory Visit: Payer: Medicare Other

## 2011-08-07 ENCOUNTER — Other Ambulatory Visit: Payer: Self-pay | Admitting: Family Medicine

## 2011-08-10 ENCOUNTER — Ambulatory Visit (INDEPENDENT_AMBULATORY_CARE_PROVIDER_SITE_OTHER): Payer: Medicare Other | Admitting: Family Medicine

## 2011-08-10 DIAGNOSIS — I4891 Unspecified atrial fibrillation: Secondary | ICD-10-CM

## 2011-08-10 DIAGNOSIS — Z5181 Encounter for therapeutic drug level monitoring: Secondary | ICD-10-CM

## 2011-08-10 DIAGNOSIS — Z7901 Long term (current) use of anticoagulants: Secondary | ICD-10-CM

## 2011-08-10 LAB — POCT INR: INR: 2.5

## 2011-08-10 NOTE — Patient Instructions (Signed)
Continue current dose, check in 4 weeks  

## 2011-08-31 ENCOUNTER — Other Ambulatory Visit: Payer: Self-pay | Admitting: *Deleted

## 2011-08-31 MED ORDER — WARFARIN SODIUM 4 MG PO TABS: ORAL_TABLET | ORAL | Status: DC

## 2011-09-03 ENCOUNTER — Telehealth: Payer: Self-pay | Admitting: Family Medicine

## 2011-09-03 NOTE — Telephone Encounter (Signed)
Caller: Rhylynn/Patient; PCP: Roxy Manns A.; CB#: [phone]; ; ; Call regarding Mouth Mucus, Would Like To Be Tested for Cystic Fibrosis;  Pt calling regarding chronic congestion for a few months. Pt concerned about Cystic Fibrosis; no hx-read sxs on line. Explained that it would be more likely to be Sinusitis or other UR sxs but stated we'd be happy to see pt. Afebrile and Emerg. sxs r/o. Pt wishes to be seen when coming in [facility] on 3[facility]. Appt made by Lyla Son in office, relayed to pt ([facility] could not bypass in [place]). Pt to call back before with any change in sxs.

## 2011-09-07 ENCOUNTER — Ambulatory Visit (INDEPENDENT_AMBULATORY_CARE_PROVIDER_SITE_OTHER): Payer: Medicare Other | Admitting: Family Medicine

## 2011-09-07 ENCOUNTER — Encounter: Payer: Self-pay | Admitting: Family Medicine

## 2011-09-07 VITALS — BP 110/64 | HR 63 | Temp 98.0°F | Ht 64.0 in | Wt 165.0 lb

## 2011-09-07 DIAGNOSIS — R0982 Postnasal drip: Secondary | ICD-10-CM

## 2011-09-07 DIAGNOSIS — Z7901 Long term (current) use of anticoagulants: Secondary | ICD-10-CM | POA: Diagnosis not present

## 2011-09-07 DIAGNOSIS — Z5181 Encounter for therapeutic drug level monitoring: Secondary | ICD-10-CM

## 2011-09-07 DIAGNOSIS — J069 Acute upper respiratory infection, unspecified: Secondary | ICD-10-CM | POA: Diagnosis not present

## 2011-09-07 DIAGNOSIS — I4891 Unspecified atrial fibrillation: Secondary | ICD-10-CM

## 2011-09-07 LAB — POCT INR: INR: 2.4

## 2011-09-07 MED ORDER — DOXYCYCLINE HYCLATE 100 MG PO TABS: 100.0000 mg | ORAL_TABLET | Freq: Two times a day (BID) | ORAL | Status: AC

## 2011-09-07 NOTE — Progress Notes (Signed)
Subjective:    Patient ID: [name], female    DOB: February 09, 1933, 78 y.o.   MRN: [id number]  HPI Is here with come congestion/ uri symptoms  Clearing throat and now coughing  Mucous in her mouth - spitting it out   White to green  No fever  Not sob  No gurgling in chest - feels like throat more than chest  No runny nose but has post nasal drip  No sinus pain   Patient Active Problem List  Diagnosis  . ADENOMATOUS COLONIC POLYP  . HYPOTHYROIDISM NOS  . HYPERLIPIDEMIA  . OBESITY  . DEPRESSION  . ATRIAL FIBRILLATION  . ASTHMA  . PULMONARY NODULE  . RECTAL BLEEDING  . MENOPAUSE-RELATED VASOMOTOR SYMPTOMS  . NECK PAIN  . GANGLION CYST  . INSOMNIA  . URINARY INCONTINENCE  . ABDOMINAL PAIN  . COLONIC POLYPS, ADENOMATOUS, HX OF  . Cardiomyopathy  Tachycardia-resolved  . Sore throat   Past Medical History  Diagnosis Date  . TIA (transient ischemic attack)     facial numbness 2010  . Cardiomyopathy     rate-related-resolved  . Atrial fibrillation     s/p PVI [name] 2010  . History of mitral valve repair   . FH: colonic polyps   . Hypothyroidism   . Carotid stenosis     mild (hosp 3/11)- consult by vasc/ Dr Arbie Cookey  . Pulmonary nodule   . Depression   . Asthma   . Allergy     allergic rhinitis   Past Surgical History  Procedure Date  . Mitral valve replacement   . Pilonidal cyst removal   . Colonoscopy 2001  . Exercise stress test 2/07  . Ct of abd and pelvis 10/07    negative  . Carotid doppler 10/07    no stenosis  . Gross hematuria on anticoagulants 10/07    neg urol work up   . Admit- syncope, bradycardia     cardiac and neuro work up  . Colonoscopy- polyps 8/08  . Tia 3/11    nl imaging and ech, with mod carotid stenosis (had vasc consul  . Appendectomy    History  Substance Use Topics  . Smoking status: Never Smoker   . Smokeless tobacco: Never Used  . Alcohol Use: Yes     occasional glass of winre   Family History  Problem Relation Age of  Onset  . Hypertension Mother   . Lung cancer Father     smoker  . Alcohol abuse Father   . Cancer Father     bladder and lung CA smoker   Allergies  Allergen Reactions  . Amiodarone Hcl     REACTION: Intolerance  . Penicillins     REACTION: rash  . Statins     REACTION: rash   Current Outpatient Prescriptions on File Prior to Visit  Medication Sig Dispense Refill  . dofetilide (TIKOSYN) 500 MCG capsule Take 1 capsule (500 mcg total) by mouth 2 (two) times daily.  60 capsule  6  . multivitamin (THERAGRAN) per tablet Take 1 tablet by mouth daily.        [name] warfarin (COUMADIN) 1 MG tablet TAKE AS DIRECTED  30 tablet  11  . warfarin (COUMADIN) 3 MG tablet TAKE ONE TABLET BY MOUTH AS DIRECTED  30 tablet  3  . warfarin (COUMADIN) 4 MG tablet Take one tablet by mouth as directed  90 tablet  2      Review of Systems Review of Systems  Constitutional: Negative for fever, appetite change, fatigue and unexpected weight change.  Eyes: Negative for pain and visual disturbance.  ENT pos for constant mucous in throat and mouth and post nasal drip /no ST  Respiratory: Negative for cough and shortness of breath.   Cardiovascular: Negative for cp or palpitations    Gastrointestinal: Negative for nausea, diarrhea and constipation.  Genitourinary: Negative for urgency and frequency.  Skin: Negative for pallor or rash   Neurological: Negative for weakness, light-headedness, numbness and headaches.  Hematological: Negative for adenopathy. Does not bruise/bleed easily.  Psychiatric/Behavioral: Negative for dysphoric mood. The patient is not nervous/anxious.         Objective:   Physical Exam  Constitutional: She appears well-developed and well-nourished. No distress.  HENT:  Head: Normocephalic and atraumatic.  Right Ear: External ear normal.  Left Ear: External ear normal.  Mouth/Throat: Oropharynx is clear and moist. No oropharyngeal exudate.       Nares are injected and congested     Some clear post nasal drip noted Throat otherwise clear   No sinus tenderness  Eyes: Conjunctivae and EOM are normal. Pupils are equal, round, and reactive to light. Right eye exhibits no discharge. Left eye exhibits no discharge.  Neck: Normal range of motion. Neck supple.  Cardiovascular: Normal rate and regular rhythm.   Pulmonary/Chest: Effort normal and breath sounds normal. No respiratory distress. She has no wheezes. She has no rales.  Lymphadenopathy:    She has no cervical adenopathy.  Neurological: She is alert.  Skin: Skin is warm and dry. No rash noted.  Psychiatric: She has a normal mood and affect.          Assessment & Plan:

## 2011-09-07 NOTE — Patient Instructions (Signed)
will check Fri, patient starting an antibiotic today)

## 2011-09-07 NOTE — Assessment & Plan Note (Signed)
Over 1 mo of green mucous in throat- /post nasal drip and mild cough  tx with doxycycline (on coumadin and anti arrhythmic) Needs INR checked in next week  Fluids/ rest  If not imp consider allergies/ consider cxr  Update if not starting to improve in a week or if worsening

## 2011-09-07 NOTE — Assessment & Plan Note (Signed)
Without runny nose - suspect rel to uri (some green so will cover with doxycycline) and update If not imp consider antihist  Will take into account rxn with coumadin

## 2011-09-07 NOTE — Patient Instructions (Addendum)
Take the doxycycline as directed for congestion/ upper respiratory symptoms If not improved let me know  Schedule next INR in about a week since the antibiotic may affect it

## 2011-09-11 ENCOUNTER — Ambulatory Visit (INDEPENDENT_AMBULATORY_CARE_PROVIDER_SITE_OTHER): Payer: Medicare Other | Admitting: Family Medicine

## 2011-09-11 ENCOUNTER — Ambulatory Visit: Payer: Medicare Other

## 2011-09-11 DIAGNOSIS — Z7901 Long term (current) use of anticoagulants: Secondary | ICD-10-CM | POA: Diagnosis not present

## 2011-09-11 DIAGNOSIS — Z5181 Encounter for therapeutic drug level monitoring: Secondary | ICD-10-CM | POA: Diagnosis not present

## 2011-09-11 DIAGNOSIS — I4891 Unspecified atrial fibrillation: Secondary | ICD-10-CM | POA: Diagnosis not present

## 2011-09-11 NOTE — Patient Instructions (Addendum)
5 mg daily, recheck 4 weeks 

## 2011-10-09 ENCOUNTER — Ambulatory Visit (INDEPENDENT_AMBULATORY_CARE_PROVIDER_SITE_OTHER): Payer: Medicare Other | Admitting: Family Medicine

## 2011-10-09 DIAGNOSIS — I4891 Unspecified atrial fibrillation: Secondary | ICD-10-CM

## 2011-10-09 DIAGNOSIS — Z7901 Long term (current) use of anticoagulants: Secondary | ICD-10-CM | POA: Diagnosis not present

## 2011-10-09 DIAGNOSIS — Z5181 Encounter for therapeutic drug level monitoring: Secondary | ICD-10-CM | POA: Diagnosis not present

## 2011-10-09 LAB — POCT INR: INR: 2.8

## 2011-10-09 NOTE — Patient Instructions (Addendum)
5 mg daily recheck in 4 weeks 

## 2011-11-06 ENCOUNTER — Ambulatory Visit: Payer: Medicare Other

## 2011-11-10 ENCOUNTER — Ambulatory Visit (INDEPENDENT_AMBULATORY_CARE_PROVIDER_SITE_OTHER): Payer: Medicare Other | Admitting: Family Medicine

## 2011-11-10 DIAGNOSIS — Z5181 Encounter for therapeutic drug level monitoring: Secondary | ICD-10-CM | POA: Diagnosis not present

## 2011-11-10 DIAGNOSIS — I4891 Unspecified atrial fibrillation: Secondary | ICD-10-CM | POA: Diagnosis not present

## 2011-11-10 DIAGNOSIS — Z7901 Long term (current) use of anticoagulants: Secondary | ICD-10-CM | POA: Diagnosis not present

## 2011-11-10 LAB — POCT INR: INR: 2.2

## 2011-11-10 NOTE — Patient Instructions (Signed)
5 mg daily recheck in 4 weeks 

## 2011-11-16 DIAGNOSIS — H16109 Unspecified superficial keratitis, unspecified eye: Secondary | ICD-10-CM | POA: Diagnosis not present

## 2011-11-24 DIAGNOSIS — L820 Inflamed seborrheic keratosis: Secondary | ICD-10-CM | POA: Diagnosis not present

## 2011-12-08 ENCOUNTER — Ambulatory Visit (INDEPENDENT_AMBULATORY_CARE_PROVIDER_SITE_OTHER): Payer: Medicare Other | Admitting: Family Medicine

## 2011-12-08 DIAGNOSIS — I4891 Unspecified atrial fibrillation: Secondary | ICD-10-CM | POA: Diagnosis not present

## 2011-12-08 DIAGNOSIS — Z7901 Long term (current) use of anticoagulants: Secondary | ICD-10-CM

## 2011-12-08 DIAGNOSIS — Z5181 Encounter for therapeutic drug level monitoring: Secondary | ICD-10-CM | POA: Diagnosis not present

## 2011-12-08 LAB — POCT INR: INR: 2.1

## 2011-12-08 NOTE — Patient Instructions (Signed)
5 mg daily recheck in 4 weeks

## 2011-12-31 ENCOUNTER — Other Ambulatory Visit: Payer: Self-pay | Admitting: *Deleted

## 2011-12-31 ENCOUNTER — Ambulatory Visit (INDEPENDENT_AMBULATORY_CARE_PROVIDER_SITE_OTHER): Payer: Medicare Other | Admitting: Internal Medicine

## 2011-12-31 ENCOUNTER — Encounter: Payer: Self-pay | Admitting: Internal Medicine

## 2011-12-31 VITALS — BP 139/78 | HR 74 | Ht 65.0 in | Wt 166.4 lb

## 2011-12-31 DIAGNOSIS — I4891 Unspecified atrial fibrillation: Secondary | ICD-10-CM

## 2011-12-31 NOTE — Patient Instructions (Addendum)
Your physician wants you to follow-up in: 6 months with Dr. Graciela Husbands. You will receive a reminder letter in the mail two months in advance. If you don't receive a letter, please call our office to schedule the follow-up appointment.  LABS TODAY:  [facility]

## 2011-12-31 NOTE — Progress Notes (Signed)
Patient Care Team: [name], MD as PCP - General   HPI  [name] is a 78 y.o. female seen following an atrial fibrillation ablation undertaken at [facility] in March 2010. The postprocedural interval was complicated by recurrent atrial arrhythmias prompting initiation of Tikosyn following cardioversion for atrial tachycardia. This was stopped last fall. About 6  months ago she noted increasing exercise intolerance. There has been some orthostatic lightheadedness and occasional palpitations  There has been no peripheral edema  Because of that we elected to reinitiate Tikosyn. She was admitted in August and cardioverted. She reverted to atrial fibrillation and was cardioverted again. For the most part she has remained in sinus rhythm since then and has been doing well  Her major struggle is with her 26 year old mother who has just come to move in with her.  Past Medical History  Diagnosis Date  . TIA (transient ischemic attack)     facial numbness 2010  . Cardiomyopathy     rate-related-resolved  . Atrial fibrillation     s/p PVI [name] 2010  . History of mitral valve repair   . FH: colonic polyps   . Hypothyroidism   . Carotid stenosis     mild (hosp 3/11)- consult by vasc/ Dr Arbie Cookey  . Pulmonary nodule   . Depression   . Asthma   . Allergy     allergic rhinitis    Past Surgical History  Procedure Date  . Mitral valve replacement   . Pilonidal cyst removal   . Colonoscopy 2001  . Exercise stress test 2/07  . Ct of abd and pelvis 10/07    negative  . Carotid doppler 10/07    no stenosis  . Gross hematuria on anticoagulants 10/07    neg urol work up   . Admit- syncope, bradycardia     cardiac and neuro work up  . Colonoscopy- polyps 8/08  . Tia 3/11    nl imaging and ech, with mod carotid stenosis (had vasc consul  . Appendectomy     Current Outpatient Prescriptions  Medication Sig Dispense Refill  . dofetilide (TIKOSYN) 500 MCG capsule Take 1 capsule (500 mcg  total) by mouth 2 (two) times daily.  60 capsule  6  . multivitamin (THERAGRAN) per tablet Take 1 tablet by mouth daily.        . NON FORMULARY Xango Juice Supplement.      . warfarin (COUMADIN) 1 MG tablet TAKE AS DIRECTED  30 tablet  11  . warfarin (COUMADIN) 3 MG tablet TAKE ONE TABLET BY MOUTH AS DIRECTED  30 tablet  3  . warfarin (COUMADIN) 4 MG tablet 5 mg. Take one tablet by mouth as directed      . DISCONTD: warfarin (COUMADIN) 4 MG tablet Take one tablet by mouth as directed  90 tablet  2    Allergies  Allergen Reactions  . Amiodarone Hcl     REACTION: Intolerance  . Penicillins     REACTION: rash  . Statins     REACTION: rash    Review of Systems negative except from HPI and PMH  Physical Exam BP 139/78  Pulse 74  Ht 5\' 5"  (1.651 m)  Wt 166 lb 6.4 oz (75.479 kg)  BMI 27.69 kg/m2 Well developed and nourished in no acute distress HENT normal Neck supple with JVP-flat Carotids brisk and full without bruits Clear Regular rate and rhythm, no murmurs or gallops Abd-soft with active BS without hepatomegaly No Clubbing cyanosis edema  Skin-warm and dry A & Oriented  Grossly normal sensory and motor function  Electrocardiogram demonstrates sinus rhythm at 74 Intervals 16/10/40 with a QTC of 44 Axis -15 Abnormal P wave origin    Assessment and  Plan

## 2011-12-31 NOTE — Assessment & Plan Note (Addendum)
Atrial fibrillation currently holding sinus rhythm on dofetilide. She is also on warfarin  We will check surveillance labs today

## 2012-01-01 LAB — BASIC METABOLIC PANEL
BUN: 24 mg/dL — ABNORMAL HIGH (ref 6–23)
Chloride: 103 mEq/L (ref 96–112)
Creatinine, Ser: 0.8 mg/dL (ref 0.4–1.2)
GFR: 73.89 mL/min (ref >60.00)
Potassium: 3.9 mEq/L (ref 3.5–5.1)

## 2012-01-01 LAB — MAGNESIUM: Magnesium: 2.1 mg/dL (ref 1.5–2.5)

## 2012-01-05 ENCOUNTER — Ambulatory Visit: Payer: Medicare Other

## 2012-01-15 ENCOUNTER — Ambulatory Visit (INDEPENDENT_AMBULATORY_CARE_PROVIDER_SITE_OTHER): Payer: Medicare Other | Admitting: Family Medicine

## 2012-01-15 ENCOUNTER — Ambulatory Visit: Payer: Medicare Other | Admitting: Family Medicine

## 2012-01-15 ENCOUNTER — Encounter: Payer: Self-pay | Admitting: Family Medicine

## 2012-01-15 VITALS — BP 138/64 | HR 76 | Temp 98.4°F | Ht 64.0 in | Wt 165.5 lb

## 2012-01-15 DIAGNOSIS — M62838 Other muscle spasm: Secondary | ICD-10-CM | POA: Insufficient documentation

## 2012-01-15 DIAGNOSIS — I4891 Unspecified atrial fibrillation: Secondary | ICD-10-CM | POA: Diagnosis not present

## 2012-01-15 DIAGNOSIS — Z7901 Long term (current) use of anticoagulants: Secondary | ICD-10-CM

## 2012-01-15 DIAGNOSIS — Z5181 Encounter for therapeutic drug level monitoring: Secondary | ICD-10-CM | POA: Diagnosis not present

## 2012-01-15 MED ORDER — CYCLOBENZAPRINE HCL 10 MG PO TABS
10.0000 mg | ORAL_TABLET | Freq: Three times a day (TID) | ORAL | Status: DC | PRN
Start: 2012-01-15 — End: 2012-02-08

## 2012-01-15 NOTE — Patient Instructions (Signed)
Continue 5 mg daily, recheck 4 weeks 

## 2012-01-15 NOTE — Progress Notes (Signed)
Subjective:    Patient ID: [name], female    DOB: [birth year], 78 y.o.   MRN: [id number]  HPI Here for neck pain  Woke up yesterday with horrible crick in her neck -- worse on the R side - rad down to shoulder blade  Tried some tylenol  Took some xango - does not mix poorly with coumadin   Rubbed neck a lot -helpful Also used heating pad   Painful to bend head forward  Stiff after inactivity  Not as bad extending her neck Rotating right is more uncomfortable than left  Hurts both ways to tilt Is caring for 7 y o mother and grandchildren   No numbness in hands or arms No weakness of grip   Sleeps on a firm pillow - foam  Not tall or short    Has had this before -not for a while   Patient Active Problem List  Diagnosis  . ADENOMATOUS COLONIC POLYP  . HYPOTHYROIDISM NOS  . HYPERLIPIDEMIA  . OBESITY  . DEPRESSION  . ATRIAL FIBRILLATION  . ASTHMA  . PULMONARY NODULE  . RECTAL BLEEDING  . MENOPAUSE-RELATED VASOMOTOR SYMPTOMS  . NECK PAIN  . GANGLION CYST  . INSOMNIA  . URINARY INCONTINENCE  . ABDOMINAL PAIN  . COLONIC POLYPS, ADENOMATOUS, HX OF  . Cardiomyopathy  Tachycardia-resolved  . Sore throat  . Post-nasal drip  . URI (upper respiratory infection)  . Neck muscle spasm   Past Medical History  Diagnosis Date  . TIA (transient ischemic attack)     facial numbness 2010  . Cardiomyopathy     rate-related-resolved  . Atrial fibrillation     s/p PVI [name] 2010  . History of mitral valve repair   . FH: colonic polyps   . Hypothyroidism   . Carotid stenosis     mild (hosp 3/11)- consult by vasc/ Dr Arbie Cookey  . Pulmonary nodule   . Depression   . Asthma   . Allergy     allergic rhinitis   Past Surgical History  Procedure Date  . Mitral valve replacement   . Pilonidal cyst removal   . Colonoscopy 2001  . Exercise stress test 2/07  . Ct of abd and pelvis 10/07    negative  . Carotid doppler 10/07    no stenosis  . Gross hematuria on  anticoagulants 10/07    neg urol work up   . Admit- syncope, bradycardia     cardiac and neuro work up  . Colonoscopy- polyps 8/08  . Tia 3/11    nl imaging and ech, with mod carotid stenosis (had vasc consul  . Appendectomy    History  Substance Use Topics  . Smoking status: Never Smoker   . Smokeless tobacco: Never Used  . Alcohol Use: Yes     occasional glass of winre   Family History  Problem Relation Age of Onset  . Hypertension Mother   . Lung cancer Father     smoker  . Alcohol abuse Father   . Cancer Father     bladder and lung CA smoker   Allergies  Allergen Reactions  . Amiodarone Hcl     REACTION: Intolerance  . Penicillins     REACTION: rash  . Statins     REACTION: rash   Current Outpatient Prescriptions on File Prior to Visit  Medication Sig Dispense Refill  . dofetilide (TIKOSYN) 500 MCG capsule Take 1 capsule (500 mcg total) by mouth 2 (two) times daily.  60 capsule  6  . multivitamin (THERAGRAN) per tablet Take 1 tablet by mouth daily.        . NON FORMULARY Xango Juice Supplement.      . warfarin (COUMADIN) 1 MG tablet TAKE AS DIRECTED  30 tablet  11  . warfarin (COUMADIN) 3 MG tablet TAKE ONE TABLET BY MOUTH AS DIRECTED  30 tablet  3  . warfarin (COUMADIN) 4 MG tablet 5 mg. Take one tablet by mouth as directed           Review of Systems Review of Systems  Constitutional: Negative for fever, appetite change, fatigue and unexpected weight change.  Eyes: Negative for pain and visual disturbance.  Respiratory: Negative for cough and shortness of breath.   Cardiovascular: Negative for cp or palpitations    Gastrointestinal: Negative for nausea, diarrhea and constipation.  Genitourinary: Negative for urgency and frequency.  Skin: Negative for pallor or rash   MSK pos for neck pain  Neurological: Negative for weakness, light-headedness, numbness and headaches.  Hematological: Negative for adenopathy. Does not bruise/bleed easily.    Psychiatric/Behavioral: Negative for dysphoric mood. The patient is not nervous/anxious.         Objective:   Physical Exam  Constitutional: She appears well-developed and well-nourished. No distress.  HENT:  Head: Normocephalic and atraumatic.  Mouth/Throat: Oropharynx is clear and moist.  Eyes: Conjunctivae normal and EOM are normal. Pupils are equal, round, and reactive to light. No scleral icterus.  Neck: Normal range of motion. Neck supple. No JVD present. No thyromegaly present.  Cardiovascular: Normal rate and regular rhythm.   Pulmonary/Chest: Effort normal and breath sounds normal.  Musculoskeletal: She exhibits tenderness.       Cervical back: She exhibits decreased range of motion, tenderness and spasm. She exhibits no bony tenderness and no edema.       Muscle spasm in neck - extending into trapezius and medial scapular area No bony tenderness Most painful to flex and rotate L  Lymphadenopathy:    She has no cervical adenopathy.  Neurological: She is alert.  Skin: Skin is warm and dry. No rash noted. No erythema.  Psychiatric: She has a normal mood and affect.          Assessment & Plan:

## 2012-01-15 NOTE — Assessment & Plan Note (Signed)
Right sided spasm- slt imp from yesterday No neuro s/s Disc symptomatic care - see instructions on AVS  Trial of flexeril with caution Update if not starting to improve in a week or if worsening

## 2012-01-15 NOTE — Patient Instructions (Addendum)
Try a foam cervical support pillow  Continue using heat Tylenol as needed I sent px for muscle relaxer - to your pharmacy (flexeril )- watch out for sedation If you do not improve in 3-4 days - let me know and we can recommend physical therapy

## 2012-01-23 ENCOUNTER — Other Ambulatory Visit: Payer: Self-pay | Admitting: Internal Medicine

## 2012-01-27 ENCOUNTER — Other Ambulatory Visit: Payer: Self-pay | Admitting: Internal Medicine

## 2012-02-08 ENCOUNTER — Emergency Department (HOSPITAL_COMMUNITY): Payer: Medicare Other

## 2012-02-08 ENCOUNTER — Emergency Department (HOSPITAL_COMMUNITY)
Admission: EM | Admit: 2012-02-08 | Discharge: 2012-02-08 | Disposition: A | Discharge status: Home / Self Care | Payer: Medicare Other | Attending: Emergency Medicine | Admitting: Emergency Medicine

## 2012-02-08 ENCOUNTER — Encounter (HOSPITAL_COMMUNITY): Payer: Self-pay | Admitting: *Deleted

## 2012-02-08 DIAGNOSIS — Z86718 Personal history of other venous thrombosis and embolism: Secondary | ICD-10-CM | POA: Diagnosis not present

## 2012-02-08 DIAGNOSIS — J45909 Unspecified asthma, uncomplicated: Secondary | ICD-10-CM | POA: Diagnosis not present

## 2012-02-08 DIAGNOSIS — Z8709 Personal history of other diseases of the respiratory system: Secondary | ICD-10-CM | POA: Diagnosis not present

## 2012-02-08 DIAGNOSIS — Z7901 Long term (current) use of anticoagulants: Secondary | ICD-10-CM | POA: Insufficient documentation

## 2012-02-08 DIAGNOSIS — K5730 Diverticulosis of large intestine without perforation or abscess without bleeding: Secondary | ICD-10-CM | POA: Diagnosis not present

## 2012-02-08 DIAGNOSIS — I4891 Unspecified atrial fibrillation: Secondary | ICD-10-CM | POA: Diagnosis not present

## 2012-02-08 DIAGNOSIS — Z8659 Personal history of other mental and behavioral disorders: Secondary | ICD-10-CM | POA: Diagnosis not present

## 2012-02-08 DIAGNOSIS — I428 Other cardiomyopathies: Secondary | ICD-10-CM | POA: Diagnosis not present

## 2012-02-08 DIAGNOSIS — R319 Hematuria, unspecified: Secondary | ICD-10-CM | POA: Diagnosis not present

## 2012-02-08 DIAGNOSIS — E039 Hypothyroidism, unspecified: Secondary | ICD-10-CM | POA: Insufficient documentation

## 2012-02-08 LAB — CBC WITH DIFFERENTIAL/PLATELET
Basophils Absolute: 0 10*3/uL (ref 0.0–0.1)
Basophils Relative: 1 % (ref 0–1)
MCHC: 33.3 g/dL (ref 30.0–36.0)
Neutro Abs: 2.1 10*3/uL (ref 1.7–7.7)
Neutrophils Relative %: 56 % (ref 43–77)
RDW: 14.1 % (ref 11.5–15.5)

## 2012-02-08 LAB — URINALYSIS, ROUTINE W REFLEX MICROSCOPIC
Ketones, ur: 15 mg/dL — AB
Nitrite: POSITIVE — AB
Urobilinogen, UA: 0.2 mg/dL (ref 0.0–1.0)
pH: 5 (ref 5.0–8.0)

## 2012-02-08 LAB — URINE MICROSCOPIC-ADD ON

## 2012-02-08 LAB — POCT I-STAT, CHEM 8
Creatinine, Ser: 0.9 mg/dL (ref 0.50–1.10)
Glucose, Bld: 96 mg/dL (ref 70–99)
Hemoglobin: 12.6 g/dL (ref 12.0–15.0)
Sodium: 144 mEq/L (ref 135–145)
TCO2: 25 mmol/L (ref 0–100)

## 2012-02-08 LAB — PROTIME-INR
INR: 1.83 — ABNORMAL HIGH (ref 0.00–1.49)
Prothrombin Time: 20.5 seconds — ABNORMAL HIGH (ref 11.6–15.2)

## 2012-02-08 MED ORDER — NITROFURANTOIN MONOHYD MACRO 100 MG PO CAPS: 100.0000 mg | ORAL_CAPSULE | Freq: Two times a day (BID) | ORAL | Status: DC

## 2012-02-08 NOTE — ED Notes (Addendum)
Noticed blood in urine, onset ~ 24 hrs ago, worse tonight, takes coumadin, has not had that in ~ 24 hrs. Denies pain or other sx. "Noticed some urgency. Legs ache a little. Really tired".

## 2012-02-08 NOTE — ED Provider Notes (Signed)
History     CSN: [id number]  Arrival date & time 02/08/12  4098   First MD Initiated Contact with Patient 02/08/12 0344      Chief Complaint  Patient presents with  . Hematuria    (Consider location/radiation/quality/duration/timing/severity/associated sxs/prior treatment) Patient is a 78 y.o. female presenting with hematuria and frequency. The history is provided by the patient.  Hematuria This is a new problem. The current episode started yesterday. The problem is unchanged. She describes the hematuria as gross hematuria. The hematuria occurs throughout her entire urinary stream. She reports no clotting in her urine stream. She is experiencing no pain. She describes her urine color as dark red. Irritative symptoms include frequency and urgency. Irritative symptoms do not include nocturia. Obstructive symptoms do not include dribbling, incomplete emptying, an intermittent stream, a slower stream, straining or a weak stream. Pertinent negatives include no abdominal pain or fever. Her sexual activity is non-contributory to the current illness. There is no history of hypertension.  Urinary Frequency This is a new problem. The current episode started yesterday. The problem occurs constantly. The problem has not changed since onset.Pertinent negatives include no chest pain and no abdominal pain. Nothing aggravates the symptoms. Nothing relieves the symptoms. She has tried nothing for the symptoms. The treatment provided no relief.    Past Medical History  Diagnosis Date  . TIA (transient ischemic attack)     facial numbness 2010  . Cardiomyopathy     rate-related-resolved  . Atrial fibrillation     s/p PVI [name] 2010  . History of mitral valve repair   . FH: colonic polyps   . Hypothyroidism   . Carotid stenosis     mild (hosp 3/11)- consult by vasc/ Dr Arbie Cookey  . Pulmonary nodule   . Depression   . Asthma   . Allergy     allergic rhinitis    Past Surgical History  Procedure Date   . Mitral valve replacement   . Pilonidal cyst removal   . Colonoscopy 2001  . Exercise stress test 2/07  . Ct of abd and pelvis 10/07    negative  . Carotid doppler 10/07    no stenosis  . Gross hematuria on anticoagulants 10/07    neg urol work up   . Admit- syncope, bradycardia     cardiac and neuro work up  . Colonoscopy- polyps 8/08  . Tia 3/11    nl imaging and ech, with mod carotid stenosis (had vasc consul  . Appendectomy     Family History  Problem Relation Age of Onset  . Hypertension Mother   . Lung cancer Father     smoker  . Alcohol abuse Father   . Cancer Father     bladder and lung CA smoker    History  Substance Use Topics  . Smoking status: Never Smoker   . Smokeless tobacco: Never Used  . Alcohol Use: Yes     Comment: occasional glass of winre    OB History    Grav Para Term Preterm Abortions TAB SAB Ect Mult Living                  Review of Systems  Constitutional: Negative for fever.  HENT: Negative for neck pain.   Cardiovascular: Negative for chest pain.  Gastrointestinal: Negative for abdominal pain.  Genitourinary: Positive for urgency, frequency and hematuria. Negative for incomplete emptying and nocturia.  All other systems reviewed and are negative.    Allergies  Amiodarone hcl; Penicillins; and Statins  Home Medications   Current Outpatient Rx  Name  Route  Sig  Dispense  Refill  . DOFETILIDE 500 MCG PO CAPS   Oral   Take 500 mcg by mouth 2 (two) times daily.         . MULTIVITAMINS PO TABS   Oral   Take 1 tablet by mouth daily.           . NON FORMULARY   Oral   Take 1 tablet by mouth daily. Herbal supplement with herbs, chromium, and tea extracts. Taken for appetite suppressant.         . WARFARIN SODIUM 5 MG PO TABS   Oral   Take 5 mg by mouth daily.           BP 136/55  Pulse 69  Temp 97.8 F (36.6 C) (Oral)  Resp 14  SpO2 98%  Physical Exam  Constitutional: She is oriented to person, place,  and time. She appears well-developed and well-nourished. No distress.  HENT:  Head: Normocephalic and atraumatic.  Mouth/Throat: Oropharynx is clear and moist.  Eyes: Conjunctivae normal are normal. Pupils are equal, round, and reactive to light.  Neck: Normal range of motion. Neck supple.  Cardiovascular: Normal rate, regular rhythm and intact distal pulses.   Pulmonary/Chest: Effort normal and breath sounds normal. She has no wheezes. She has no rales.  Abdominal: Soft. Bowel sounds are normal. There is no tenderness. There is no rebound and no guarding.  Musculoskeletal: Normal range of motion. She exhibits no edema.  Neurological: She is alert and oriented to person, place, and time.  Skin: Skin is warm and dry.  Psychiatric: She has a normal mood and affect.    ED Course  Procedures (including critical care time)  Labs Reviewed  PROTIME-INR - Abnormal; Notable for the following:    Prothrombin Time 20.5 (*)     INR 1.83 (*)     All other components within normal limits  URINALYSIS, ROUTINE W REFLEX MICROSCOPIC - Abnormal; Notable for the following:    Color, Urine RED (*)  BIOCHEMICALS MAY BE AFFECTED BY COLOR   APPearance TURBID (*)     Hgb urine dipstick LARGE (*)     Bilirubin Urine LARGE (*)     Ketones, ur 15 (*)     Protein, ur 100 (*)     Nitrite POSITIVE (*)     Leukocytes, UA MODERATE (*)     All other components within normal limits  POCT I-STAT, CHEM 8 - Abnormal; Notable for the following:    BUN 29 (*)     All other components within normal limits  URINE MICROSCOPIC-ADD ON - Abnormal; Notable for the following:    Bacteria, UA FEW (*)     All other components within normal limits  URINE CULTURE  CBC WITH DIFFERENTIAL   No results found.   No diagnosis found.    MDM  Will treat for UTI.  Follow up with urology this week.  Call your doctor regarding your coumadin return for passage of clots or inability to make urine.  Patient verbalizes understanding  and agrees to follow up       Shafter Jupin Smitty Cords, MD 02/08/12 [phone]

## 2012-02-09 LAB — URINE CULTURE

## 2012-02-12 ENCOUNTER — Ambulatory Visit (INDEPENDENT_AMBULATORY_CARE_PROVIDER_SITE_OTHER): Payer: Medicare Other | Admitting: Family Medicine

## 2012-02-12 DIAGNOSIS — I4891 Unspecified atrial fibrillation: Secondary | ICD-10-CM | POA: Diagnosis not present

## 2012-02-12 DIAGNOSIS — Z7901 Long term (current) use of anticoagulants: Secondary | ICD-10-CM

## 2012-02-12 DIAGNOSIS — Z5181 Encounter for therapeutic drug level monitoring: Secondary | ICD-10-CM

## 2012-02-12 LAB — POCT INR: INR: 1.1

## 2012-02-12 NOTE — Patient Instructions (Addendum)
8 mg today only then  5 mg daily recheck in 1 week

## 2012-02-19 ENCOUNTER — Ambulatory Visit (INDEPENDENT_AMBULATORY_CARE_PROVIDER_SITE_OTHER): Payer: Medicare Other | Admitting: Family Medicine

## 2012-02-19 DIAGNOSIS — I4891 Unspecified atrial fibrillation: Secondary | ICD-10-CM

## 2012-02-19 DIAGNOSIS — Z7901 Long term (current) use of anticoagulants: Secondary | ICD-10-CM

## 2012-02-19 DIAGNOSIS — Z5181 Encounter for therapeutic drug level monitoring: Secondary | ICD-10-CM

## 2012-02-19 LAB — POCT INR: INR: 1.9

## 2012-02-19 NOTE — Patient Instructions (Signed)
Continue 5 mg daily recheck in 2  week

## 2012-03-03 ENCOUNTER — Ambulatory Visit (INDEPENDENT_AMBULATORY_CARE_PROVIDER_SITE_OTHER): Payer: Medicare Other | Admitting: General Practice

## 2012-03-03 DIAGNOSIS — Z7901 Long term (current) use of anticoagulants: Secondary | ICD-10-CM | POA: Diagnosis not present

## 2012-03-03 DIAGNOSIS — I4891 Unspecified atrial fibrillation: Secondary | ICD-10-CM

## 2012-03-08 ENCOUNTER — Telehealth: Payer: Self-pay | Admitting: Family Medicine

## 2012-03-08 DIAGNOSIS — R310 Gross hematuria: Secondary | ICD-10-CM | POA: Insufficient documentation

## 2012-03-08 NOTE — Telephone Encounter (Signed)
Appt made with Dr Sherron Monday on 03/30/12 at 1:45 patient aware.

## 2012-03-08 NOTE — Telephone Encounter (Signed)
Pt left v/m requesting referral to urologist after her recent hospitalization.  She states that she has seen a urologist in the past but does not remember his name.  Please advise.

## 2012-03-09 ENCOUNTER — Encounter: Payer: Self-pay | Admitting: Family Medicine

## 2012-03-09 ENCOUNTER — Ambulatory Visit (INDEPENDENT_AMBULATORY_CARE_PROVIDER_SITE_OTHER): Payer: Medicare Other | Admitting: Family Medicine

## 2012-03-09 VITALS — BP 130/80 | HR 73 | Temp 98.1°F | Ht 64.0 in | Wt 168.0 lb

## 2012-03-09 DIAGNOSIS — S63609A Unspecified sprain of unspecified thumb, initial encounter: Secondary | ICD-10-CM

## 2012-03-09 DIAGNOSIS — S6390XA Sprain of unspecified part of unspecified wrist and hand, initial encounter: Secondary | ICD-10-CM

## 2012-03-09 NOTE — Progress Notes (Signed)
[facility] conservation officer at [facility] [address] [place] [address] Phone: [phone] Fax: [phone]  Date:  03/09/2012   Name:  [name]   DOB:  Jun 26, 1932   MRN:  [id number] Gender: female Age: 78 y.o.  PCP:  Roxy Manns, MD  Evaluating MD: Hannah Beat, MD   Chief Complaint: Hand Pain   History of Present Illness:  [name] is a 78 y.o. pleasant patient who presents with the following:  Taking some boxes off the shelf about ten days ago, and LHD, and now some pain in her 1st digit.  Felt a pop Small dorsal swelling. Some pain with Rom at thumb.  Patient Active Problem List  Diagnosis  . ADENOMATOUS COLONIC POLYP  . HYPOTHYROIDISM NOS  . HYPERLIPIDEMIA  . OBESITY  . DEPRESSION  . ATRIAL FIBRILLATION  . ASTHMA  . PULMONARY NODULE  . RECTAL BLEEDING  . MENOPAUSE-RELATED VASOMOTOR SYMPTOMS  . NECK PAIN  . GANGLION CYST  . INSOMNIA  . URINARY INCONTINENCE  . ABDOMINAL PAIN  . COLONIC POLYPS, ADENOMATOUS, HX OF  . Cardiomyopathy  Tachycardia-resolved  . Sore throat  . Post-nasal drip  . URI (upper respiratory infection)  . Neck muscle spasm  . Hematuria, gross    Past Medical History  Diagnosis Date  . TIA (transient ischemic attack)     facial numbness 2010  . Cardiomyopathy     rate-related-resolved  . Atrial fibrillation     s/p PVI [name] 2010  . History of mitral valve repair   . FH: colonic polyps   . Hypothyroidism   . Carotid stenosis     mild (hosp 3/11)- consult by vasc/ Dr Arbie Cookey  . Pulmonary nodule   . Depression   . Asthma   . Allergy     allergic rhinitis    Past Surgical History  Procedure Date  . Mitral valve replacement   . Pilonidal cyst removal   . Colonoscopy 2001  . Exercise stress test 2/07  . Ct of abd and pelvis 10/07    negative  . Carotid doppler 10/07    no stenosis  . Gross hematuria on anticoagulants 10/07    neg urol work up   . Admit- syncope, bradycardia     cardiac and neuro work up   . Colonoscopy- polyps 8/08  . Tia 3/11    nl imaging and ech, with mod carotid stenosis (had vasc consul  . Appendectomy     History  Substance Use Topics  . Smoking status: Never Smoker   . Smokeless tobacco: Never Used  . Alcohol Use: Yes     Comment: occasional glass of winre    Family History  Problem Relation Age of Onset  . Hypertension Mother   . Lung cancer Father     smoker  . Alcohol abuse Father   . Cancer Father     bladder and lung CA smoker    Allergies  Allergen Reactions  . Amiodarone Hcl     REACTION: Intolerance  . Penicillins     REACTION: rash  . Statins     REACTION: rash    Medication list has been reviewed and updated.  Outpatient Prescriptions Prior to Visit  Medication Sig Dispense Refill  . dofetilide (TIKOSYN) 500 MCG capsule Take 500 mcg by mouth 2 (two) times daily.      . multivitamin (THERAGRAN) per tablet Take 1 tablet by mouth daily.        [name]  NON FORMULARY Take 1 tablet by mouth daily. Herbal supplement with herbs, chromium, and tea extracts. Taken for appetite suppressant.      . warfarin (COUMADIN) 5 MG tablet Take 5 mg by mouth daily.      . nitrofurantoin, macrocrystal-monohydrate, (MACROBID) 100 MG capsule Take 1 capsule (100 mg total) by mouth 2 (two) times daily. X 7 days  14 capsule  0   Last reviewed on 03/09/2012  9:30 AM by Consuello Masse, CMA  Review of Systems:   GEN: No fevers, chills. Nontoxic. Primarily MSK c/o today. MSK: Detailed in the HPI GI: tolerating PO intake without difficulty Neuro: No numbness, parasthesias, or tingling associated. Otherwise the pertinent positives of the ROS are noted above.    Physical Examination: Filed Vitals:   03/09/12 0927  BP: 130/80  Pulse: 73  Temp: 98.1 F (36.7 C)  TempSrc: Oral  Height: 5\' 4"  (1.626 m)  Weight: 168 lb (76.204 kg)  SpO2: 97%    Body mass index is 28.84 kg/(m^2). Ideal Body Weight: Weight in (lb) to have BMI = 25: 145.3    GEN: WDWN,  NAD, Non-toxic, Alert & Oriented x 3 HEENT: Atraumatic, Normocephalic.  Ears and Nose: No external deformity. EXTR: No clubbing/cyanosis/edema NEURO: Normal gait.  PSYCH: Normally interactive. Conversant. Not depressed or anxious appearing.  Calm demeanor.   Hand: L Ecchymosis or edema: neg ROM wrist/hand/digits/elbow: full  Carpals, MCP's, digits: NT Distal Ulna and Radius: NT Ecchymosis or edema: small area of swelling, distally, anterior radius - soft tissue Finkelstein's test: neg Snuffbox tenderness: neg Scaphoid tubercle: NT Hook of Hamate: NT Resisted supination: NT Grip, all digits: 5/5 str, BUT AT MOVEMENT OF 1ST AT MCP AND IP JOINTMORE WITH FLEXION AND ABD TTP Axial load test: neg Atrophy: neg  Hand sensation: intact   Assessment and Plan: 1. Thumb sprain     All functional limitation preserved Suspect more partial or long tear Thumb spica x 2-3 weeks  F/u prn if not improved  Hannah Beat, MD

## 2012-03-29 ENCOUNTER — Ambulatory Visit: Payer: Medicare Other

## 2012-03-30 DIAGNOSIS — R310 Gross hematuria: Secondary | ICD-10-CM | POA: Diagnosis not present

## 2012-03-30 DIAGNOSIS — N393 Stress incontinence (female) (male): Secondary | ICD-10-CM | POA: Diagnosis not present

## 2012-03-31 ENCOUNTER — Ambulatory Visit (INDEPENDENT_AMBULATORY_CARE_PROVIDER_SITE_OTHER): Payer: Medicare Other | Admitting: General Practice

## 2012-03-31 DIAGNOSIS — I4891 Unspecified atrial fibrillation: Secondary | ICD-10-CM | POA: Diagnosis not present

## 2012-03-31 LAB — POCT INR: INR: 2.3

## 2012-04-04 DIAGNOSIS — N8110 Cystocele, unspecified: Secondary | ICD-10-CM | POA: Diagnosis not present

## 2012-04-04 DIAGNOSIS — R310 Gross hematuria: Secondary | ICD-10-CM | POA: Diagnosis not present

## 2012-04-14 ENCOUNTER — Encounter: Payer: Self-pay | Admitting: Family Medicine

## 2012-04-14 ENCOUNTER — Ambulatory Visit (HOSPITAL_COMMUNITY)
Admission: RE | Admit: 2012-04-14 | Discharge: 2012-04-14 | Disposition: A | Discharge status: Home / Self Care | Payer: Medicare Other | Source: Ambulatory Visit | Attending: Family Medicine | Admitting: Family Medicine

## 2012-04-14 ENCOUNTER — Telehealth: Payer: Self-pay | Admitting: Family Medicine

## 2012-04-14 ENCOUNTER — Ambulatory Visit (INDEPENDENT_AMBULATORY_CARE_PROVIDER_SITE_OTHER): Payer: Medicare Other | Admitting: Family Medicine

## 2012-04-14 ENCOUNTER — Telehealth: Payer: Self-pay

## 2012-04-14 VITALS — BP 120/72 | HR 72 | Temp 97.8°F | Ht 64.0 in | Wt 172.5 lb

## 2012-04-14 DIAGNOSIS — I6789 Other cerebrovascular disease: Secondary | ICD-10-CM | POA: Insufficient documentation

## 2012-04-14 DIAGNOSIS — R2990 Unspecified symptoms and signs involving the nervous system: Secondary | ICD-10-CM

## 2012-04-14 DIAGNOSIS — R209 Unspecified disturbances of skin sensation: Secondary | ICD-10-CM | POA: Insufficient documentation

## 2012-04-14 DIAGNOSIS — R202 Paresthesia of skin: Secondary | ICD-10-CM

## 2012-04-14 DIAGNOSIS — R29818 Other symptoms and signs involving the nervous system: Secondary | ICD-10-CM | POA: Diagnosis not present

## 2012-04-14 DIAGNOSIS — R208 Other disturbances of skin sensation: Secondary | ICD-10-CM

## 2012-04-14 DIAGNOSIS — I4891 Unspecified atrial fibrillation: Secondary | ICD-10-CM

## 2012-04-14 DIAGNOSIS — R000 Tachycardia, unspecified: Secondary | ICD-10-CM | POA: Diagnosis not present

## 2012-04-14 DIAGNOSIS — E785 Hyperlipidemia, unspecified: Secondary | ICD-10-CM

## 2012-04-14 NOTE — Patient Instructions (Addendum)
REFERRAL: GO THE THE FRONT ROOM AT THE ENTRANCE OF OUR CLINIC, NEAR CHECK IN. ASK FOR MARION. SHE WILL HELP YOU SET UP YOUR REFERRAL. DATE: TIME:  

## 2012-04-14 NOTE — Progress Notes (Signed)
Nature conservation officer at [facility] [address] [place] [address] Phone: [phone] Fax: [phone]  Date:  04/14/2012   Name:  [name]   DOB:  1932-12-14   MRN:  [id number] Gender: female Age: 79 y.o.  Primary Physician:  Roxy Manns, MD  Evaluating MD: Hannah Beat, MD   Chief Complaint: tingling in arm and Tachycardia   History of Present Illness:  [name] is a 79 y.o. pleasant patient who presents with the following:  I was asked to urgently evaluate this patient with a history of TIA in 2011, mitral valve prolapse s/p "ring placement" in 2000 but not a true MVR, history of atrial fibrillation on Tikosyn and Coumadin, a history of carotid stenosis s/p attempted ablation at [facility] in 2010 with a total of 3 ablations in the past who presents with 48 hours ago feeling as if her hear is racing and if her pulse were soft and not fully there, fast, and irregular at the time, but this was intermittent. Currently she is not having those symptoms, but it did persist through much of the time yesterday, through this morning.  Now she feels really relaxed. Did have some tingling on the left side of her face and first noticed this about midmorning today. Her approximation is that symptoms of tingling and abnormal sensation of her LEFT face started before 9 AM. She denies slurred speech, balance disturbance, headache, facial drooping, weakness, numbness of extremities.  For now, has had to sit on the side of the bed, because felt as if might pass out - which is unusual for her.  On 3rd year without a conversion.   Feels very tired. Taking care of her 12 year old mother. 52 month old grandchild as well.  For the last month or so would have some chest pain and a twinge in her left side as well and an achy type twinge. A few anginal episodes. None that have specifically lasted and none now.  She also has had some intermittent tingling in her LEFT arm.  Face is cool and  tingly Memory has been off in the last year.   L side of face with altered sensation:  Patient Active Problem List  Diagnosis  . ADENOMATOUS COLONIC POLYP  . HYPOTHYROIDISM NOS  . HYPERLIPIDEMIA  . OBESITY  . DEPRESSION  . ATRIAL FIBRILLATION  . ASTHMA  . PULMONARY NODULE  . RECTAL BLEEDING  . MENOPAUSE-RELATED VASOMOTOR SYMPTOMS  . NECK PAIN  . GANGLION CYST  . INSOMNIA  . URINARY INCONTINENCE  . ABDOMINAL PAIN  . COLONIC POLYPS, ADENOMATOUS, HX OF  . Cardiomyopathy  Tachycardia-resolved  . Neck muscle spasm  . Hematuria, gross    Past Medical History  Diagnosis Date  . TIA (transient ischemic attack)     facial numbness 2010  . Cardiomyopathy     rate-related-resolved  . Atrial fibrillation     s/p PVI [name] 2010  . History of mitral valve repair   . FH: colonic polyps   . Hypothyroidism   . Carotid stenosis     mild (hosp 3/11)- consult by vasc/ Dr Arbie Cookey  . Pulmonary nodule   . Depression   . Asthma   . Allergy     allergic rhinitis    Past Surgical History  Procedure Date  . Mitral valve replacement     Ring around valve mitral valve  . Pilonidal cyst removal   . Colonoscopy 2001  . Exercise stress test  2/07  . Ct of abd and pelvis 10/07    negative  . Carotid doppler 10/07    no stenosis  . Gross hematuria on anticoagulants 10/07    neg urol work up   . Admit- syncope, bradycardia     cardiac and neuro work up  . Colonoscopy- polyps 8/08  . Tia 3/11    nl imaging and ech, with mod carotid stenosis (had vasc consul  . Appendectomy     History  Substance Use Topics  . Smoking status: Never Smoker   . Smokeless tobacco: Never Used  . Alcohol Use: Yes     Comment: occasional glass of winre    Family History  Problem Relation Age of Onset  . Hypertension Mother   . Lung cancer Father     smoker  . Alcohol abuse Father   . Cancer Father     bladder and lung CA smoker    Allergies  Allergen Reactions  . Amiodarone Hcl      REACTION: Intolerance  . Penicillins     REACTION: rash  . Statins     REACTION: rash    Current Outpatient Prescriptions on File Prior to Visit  Medication Sig Dispense Refill  . dofetilide (TIKOSYN) 500 MCG capsule Take 500 mcg by mouth 2 (two) times daily.      . multivitamin (THERAGRAN) per tablet Take 1 tablet by mouth daily.        . NON FORMULARY Take 1 tablet by mouth daily. Herbal supplement with herbs, chromium, and tea extracts. Taken for appetite suppressant.      . warfarin (COUMADIN) 5 MG tablet Take 5 mg by mouth daily.      . nitrofurantoin, macrocrystal-monohydrate, (MACROBID) 100 MG capsule Take 1 capsule (100 mg total) by mouth 2 (two) times daily. X 7 days  14 capsule  0     Review of Systems:  Facial tingling as above, chest pain as above, rare shortness of breath with going up stairs carrying grandchild, increased stress at home. No nausea No gait disturbance, no blurred vision O/w neuro ROS as above Otherwise, the pertinent positives and negatives are listed above and in the HPI, otherwise a full review of systems has been reviewed and is negative unless noted positive.   Physical Examination: BP 120/72  Pulse 72  Temp 97.8 F (36.6 C) (Oral)  Ht 5\' 4"  (1.626 m)  Wt 172 lb 8 oz (78.245 kg)  BMI 29.61 kg/m2  SpO2 97%  Ideal Body Weight: Weight in (lb) to have BMI = 25: 145.3    GEN: WDWN, NAD, Non-toxic, A & O x 3 HEENT: Atraumatic, Normocephalic. Neck supple. No masses, No LAD. Ears and Nose: No external deformity. CV: RRR, No M/G/R. No JVD. No thrill. No extra heart sounds. PULM: CTA B, no wheezes, crackles, rhonchi. No retractions. No resp. distress. No accessory muscle use. ABD: S, NT, ND, +BS. No rebound tenderness. No HSM.  EXTR: No c/c/e  Neuro: CN 2-12 ARE INTACT WITH EXCEPTION OF ALTERED SENSATION OF THE LEFT SIDE OF FACE, WHICH IS DIFFUSELY DIFFERENT AND ALTERED COMPARED TO THE RIGHT. FACIAL MOVEMENTS ARE INTACT. ALL OTHER CRANIAL NERVES  NORMAL. PERRLA. EOMI. Str 5/5 all extremities. DTR 2+. No clonus. A and o x 4. Romberg neg. Finger nose neg. Heel -shin neg.   PSYCH: Normally interactive. Conversant. Not depressed or anxious appearing.  Calm demeanor.   EKG: Normal sinus rhythm. Normal axis, normal R wave progression, No acute ST elevation  or depression. IRBBB.  Assessment and Plan:  1. Racing heart beat  EKG 12-Lead, EKG 12-Lead, MR Brain Wo Contrast, Basic metabolic panel, Hepatic function panel, TSH, Ambulatory referral to Cardiology  2. Atrial fibrillation  MR Brain Wo Contrast, Basic metabolic panel, Hepatic function panel, TSH, Ambulatory referral to Cardiology  3. Tingling of left face and arm  MR Brain Wo Contrast, Basic metabolic panel, Hepatic function panel, TSH, Ambulatory referral to Cardiology  4. Abnormal neurological exam  MR Brain Wo Contrast, Basic metabolic panel, Hepatic function panel, TSH, Ambulatory referral to Cardiology  5. Decreased sensation  MR Brain Wo Contrast, Basic metabolic panel, Hepatic function panel, TSH, Ambulatory referral to Cardiology  6. HYPERLIPIDEMIA     By history, worrisome for breakthrough A. Fib or potentially other arrythmia in the last 48 hours in cardiac patient with complex history, but her EKG and cardiac exam shows a normal rhythm right now. We have arranged Cardiology evaluation for tomorrow, and I greatly appreciate their help with this patient.  Altered perception of tingling that has persisted for at least 7-8 hours. Neurological exam shows that this area of concern also has altered sensation. Worrisome for potential CVA in a patient with known AF, history suggestive of potential arrythmia, known carotid disease, and we will obtain an MRI of the brain without contrast at 6:45 PM tonight at [facility] to evaluate for potential CVA.  Cc: Dr. Milinda Antis and Dr. Graciela Husbands  Orders Today:  Orders Placed This Encounter  Procedures  . MR Brain Wo Contrast    Standing Status: Future      Number of Occurrences:      Standing Expiration Date: 06/14/2013    Order Specific Question:  Does the patient have a pacemaker, internal devices, implants, aneury    Answer:  Yes    Order Specific Question:  Preferred imaging location?    Answer:  [id number]    Order Specific Question:  Reason for exam:    Answer:  Patient has had heart surgery in 2000, but it is compatible with MRI and has had brain MRI in the past. New onset facial tingling and altered sensation.  . Basic metabolic panel  . Hepatic function panel  . TSH  . Ambulatory referral to Cardiology    Referral Priority:  Urgent    Referral Type:  Consultation    Referral Reason:  Specialty Services Required    Requested Specialty:  Cardiology    Number of Visits Requested:  1  . EKG 12-Lead    Standing Status: Standing     Number of Occurrences: 1     Standing Expiration Date:     Order Specific Question:  Reason for Exam    Answer:  fast heart rate    Updated Medication List: (Includes new medications, updates to list, dose adjustments) No orders of the defined types were placed in this encounter.    Medications Discontinued: There are no discontinued medications.   Signed, Hannah Beat, MD 04/14/2012, 5:05 PM

## 2012-04-14 NOTE — Telephone Encounter (Signed)
Seen in office.

## 2012-04-14 NOTE — Telephone Encounter (Signed)
Patient Information:  Caller Name: [place]  Phone: [phone]  Patient: [name], [name]  Gender: Female  DOB: 06-Aug-1932  Age: 79 Years  PCP: [facility], [employer] [facility])  Office Follow Up:  Does the office need to follow up with this patient?: No  Instructions For The Office: N/A  RN Note:  Spoke with the office regarding patient being seen in the office. No appt available.  Office staff checking with provider.  Patient advised to go now to the office for evaluation. Understanding expressed.  Symptoms  Reason For Call & Symptoms: Patient states the "last few days" she had intermittent irregular heartracing. HX Afib but it does not normally race.   Difficulty with carrying  the baby up the stairs recently and extremely tired.    She noticed today she was having tingling /prickly feeling on Left side of jaw line. Off/On for a couple of days.  HX TIA 2007.  She is on coumadin level are normal.  Claims she is tired and weak.  AA0X3. Voice is clear.  Reviewed Health History In EMR: Yes  Reviewed Medications In EMR: Yes  Reviewed Allergies In EMR: Yes  Reviewed Surgeries / Procedures: No  Date of Onset of Symptoms: 04/12/2012  Guideline(s) Used:  Neurologic Deficit  Disposition Per Guideline:   Go to Office Now  Reason For Disposition Reached:   Tingling (e.g., pins and needles) of the face, arm or leg on one side of the body, that is present now  Advice Given:  Call Back If:  Symptoms do not go away within 30 minutes  You become worse.  Appointment Scheduled:  04/14/2012 15:30:00 Appointment Scheduled Provider:  Hannah Beat [facility])

## 2012-04-14 NOTE — Telephone Encounter (Signed)
[name] with [facility] called pt has hx of stroke,afib, cardiomyopathy tach and is on coumadin.Heart racing on and off for few days but not racing now. Prickling or tingling sensation at jaw now; pt feels extremely tired and weak. Pt does not have chest pain, SOB,.dizziness or h/a. No loss of use of extremities. Pt does not want to go to ED. Dr Milinda Antis is not in office today; Dr Patsy Lager will see pt today; pt to come now. [name] will advise pt and if condition changes or worsens before comes to office pt to go to ED.

## 2012-04-15 ENCOUNTER — Encounter: Payer: Self-pay | Admitting: Nurse Practitioner

## 2012-04-15 ENCOUNTER — Ambulatory Visit (INDEPENDENT_AMBULATORY_CARE_PROVIDER_SITE_OTHER): Payer: Medicare Other | Admitting: Nurse Practitioner

## 2012-04-15 VITALS — BP 122/59 | HR 74 | Ht 65.5 in | Wt 174.8 lb

## 2012-04-15 DIAGNOSIS — R002 Palpitations: Secondary | ICD-10-CM | POA: Diagnosis not present

## 2012-04-15 DIAGNOSIS — I4891 Unspecified atrial fibrillation: Secondary | ICD-10-CM | POA: Diagnosis not present

## 2012-04-15 LAB — HEPATIC FUNCTION PANEL
ALT: 14 U/L (ref 0–35)
AST: 20 U/L (ref 0–37)
Albumin: 3.8 g/dL (ref 3.5–5.2)
Alkaline Phosphatase: 37 U/L — ABNORMAL LOW (ref 39–117)

## 2012-04-15 LAB — BASIC METABOLIC PANEL
BUN: 20 mg/dL (ref 6–23)
CO2: 28 mEq/L (ref 19–32)
Chloride: 107 mEq/L (ref 96–112)
GFR: 60.55 mL/min (ref >60.00)
Glucose, Bld: 90 mg/dL (ref 70–99)
Potassium: 4.1 mEq/L (ref 3.5–5.1)

## 2012-04-15 LAB — TSH: TSH: 2.81 u[IU]/mL (ref 0.35–5.50)

## 2012-04-15 NOTE — Progress Notes (Signed)
Patient Name: [name] Date of Encounter: 04/15/2012  Primary Care Provider:  Roxy Manns, MD Primary Cardiologist:  Odessa Fleming, MD  Patient Profile  79 year old female with history of atrial arrhythmias who presents for follow up related to palpitations.  Problem List   Past Medical History  Diagnosis Date  . TIA (transient ischemic attack)     facial numbness 2010  . Cardiomyopathy     rate-related-resolved  . Atrial fibrillation     a. s/p PVI [name] 2010;  b. on tikosyn/coumadin;  c. 05/2009 Echo: EF 60-65%, Gr 2 DD.  [name] History of mitral valve repair   . FH: colonic polyps   . Hypothyroidism   . Carotid stenosis     mild (hosp 3/11)- consult by vasc/ Dr Arbie Cookey  . Pulmonary nodule   . Depression   . Asthma   . Allergy     allergic rhinitis   Past Surgical History  Procedure Date  . Mitral valve replacement     Ring around valve mitral valve  . Pilonidal cyst removal   . Colonoscopy 2001  . Exercise stress test 2/07  . Ct of abd and pelvis 10/07    negative  . Carotid doppler 10/07    no stenosis  . Gross hematuria on anticoagulants 10/07    neg urol work up   . Admit- syncope, bradycardia     cardiac and neuro work up  . Colonoscopy- polyps 8/08  . Tia 3/11    nl imaging and ech, with mod carotid stenosis (had vasc consul  . Appendectomy     Allergies  Allergies  Allergen Reactions  . Amiodarone Hcl     REACTION: Intolerance  . Penicillins     REACTION: rash  . Statins     REACTION: rash    HPI  A 79 year old female with history of atrial fibrillation and atrial tachycardia who is on chronic Tikosyn and Coumadin therapy.  She was in her usual state of health until 2 nights ago when at approximately midnight, she noted irregular palpitations she felt might represent atrial fibrillation.  She had no associated symptoms.  She was able to fall asleep and when she awoke yesterday morning, she continued to feel as though she had mild palpitations and  felt as though her pulse was somewhat erratic upon palpation.  With this, she felt fatigued.  At some point in the morning this cleared and she associated this with being back in sinus rhythm.  Later in the day however she developed a pressure-like headache with weakness and tingling of the left face.  She saw her premature provider and was referred to the emergency department for MRI.  Blood work was also drawn however those results are not immediately available.  MRI showed no acute findings patient was discharged home.  Her facial symptoms resolved prior to ever having the MRI and she has had no further palpitations.  She is not interested in any form of monitoring her medication adjustments at this time.  Home Medications  Prior to Admission medications   Medication Sig Start Date End Date Taking? Authorizing Provider  dofetilide (TIKOSYN) 500 MCG capsule Take 500 mcg by mouth 2 (two) times daily.   Yes Historical Provider, MD  multivitamin [facility]) per tablet Take 1 tablet by mouth daily.     Yes Historical Provider, MD  warfarin (COUMADIN) 5 MG tablet Take 5 mg by mouth daily.   Yes Historical Provider, MD  NON FORMULARY Take 1  tablet by mouth daily. Herbal supplement with herbs, chromium, and tea extracts. Taken for appetite suppressant.    Historical Provider, MD   Review of Systems  Facial numbness and palpitations as outlined above.  She denies chest pain, palpitations, dyspnea, pnd, orthopnea, n, v, dizziness, syncope, edema, weight gain, or early satiety. All other systems reviewed and are otherwise negative except as noted above.  Physical Exam  Blood pressure 122/59, pulse 74, height 5' 5.5" (1.664 m), weight 174 lb 12.8 oz (79.289 kg).  General: Pleasant, NAD Psych: Normal affect. Neuro: Alert and oriented X 3. Moves all extremities spontaneously. HEENT: Normal  Neck: Supple without bruits or JVD. Lungs:  Resp regular and unlabored, CTA. Heart: RRR no s3, s4, or  murmurs. Abdomen: Soft, non-tender, non-distended, BS + x 4.  Extremities: No clubbing, cyanosis or edema. DP/PT/Radials 2+ and equal bilaterally.  Accessory Clinical Findings  ECG - rsr, 68, lad, no acute st/t changes.  Assessment & Plan  1.  Palpitations/ paroxysmal atrial fibrillation/atrial tachycardia: Patient had an episode of palpitations the other night but seemingly lasted throughout the night although she was able to sleep through them.  Palpitations broke at some point yesterday morning.  She thinks maybe this represented recurrent atrial fibrillation however was not particularly bothered by the symptoms and is not interested in wearing a monitor to further identify she is expressing recurrent A. Fib or atrial tachycardia.  She will remain on Tikosyn therapy as well as Coumadin.  She was seen by primary care yesterday and lab work was drawn including a TSH, and complete metabolic panel.  Those results are currently pending.  2.  Left-sided facial tingling and weakness: Status post negative MRI last night.  3.  Disposition: Followup with Dr. Graciela Husbands in 3-6 months-patient will call to schedule appointment as she does not currently know her future schedule.    [name], NP 04/15/2012, 12:13 PM

## 2012-04-15 NOTE — Patient Instructions (Addendum)
Your physician recommends that you schedule a follow-up appointment in: 1-2 months with Dr Graciela Husbands

## 2012-04-18 ENCOUNTER — Encounter: Payer: Self-pay | Admitting: *Deleted

## 2012-04-28 ENCOUNTER — Ambulatory Visit (INDEPENDENT_AMBULATORY_CARE_PROVIDER_SITE_OTHER): Payer: Medicare Other | Admitting: General Practice

## 2012-04-28 ENCOUNTER — Ambulatory Visit: Payer: Medicare Other

## 2012-04-28 DIAGNOSIS — I4891 Unspecified atrial fibrillation: Secondary | ICD-10-CM

## 2012-05-26 ENCOUNTER — Ambulatory Visit (INDEPENDENT_AMBULATORY_CARE_PROVIDER_SITE_OTHER): Payer: Medicare Other | Admitting: General Practice

## 2012-05-26 DIAGNOSIS — Z7901 Long term (current) use of anticoagulants: Secondary | ICD-10-CM

## 2012-05-26 DIAGNOSIS — Z5181 Encounter for therapeutic drug level monitoring: Secondary | ICD-10-CM

## 2012-05-26 DIAGNOSIS — I4891 Unspecified atrial fibrillation: Secondary | ICD-10-CM

## 2012-05-30 ENCOUNTER — Other Ambulatory Visit: Payer: Self-pay | Admitting: Family Medicine

## 2012-05-31 ENCOUNTER — Encounter: Payer: Self-pay | Admitting: Cardiology

## 2012-06-10 ENCOUNTER — Other Ambulatory Visit: Payer: Self-pay | Admitting: *Deleted

## 2012-06-14 ENCOUNTER — Telehealth: Payer: Self-pay

## 2012-06-14 ENCOUNTER — Telehealth: Payer: Self-pay | Admitting: General Practice

## 2012-06-14 MED ORDER — WARFARIN SODIUM 5 MG PO TABS: 5.0000 mg | ORAL_TABLET | Freq: Every day | ORAL | Status: DC

## 2012-06-14 NOTE — Telephone Encounter (Signed)
Pt has been taking Warfarin 5 mg for 2 years and request 5 mg sent to [facility] [address]. Advised pt sent electronically to [facility] [address]. Pt said she has been in [place] for 1 week and was not able to get Warfarin 1 mg prior to going out of town. Pt has been taking Warfarin 4 mg for approx 10 days and wants to know if should have PT/INR done prior to 07/07/12. Pt will call Arline Asp at [place] office.

## 2012-06-14 NOTE — Telephone Encounter (Signed)
Spoke with patient and gave dosing instructions.  Instructed patient to keep next INR appointment.

## 2012-07-07 ENCOUNTER — Ambulatory Visit (INDEPENDENT_AMBULATORY_CARE_PROVIDER_SITE_OTHER): Payer: Medicare Other | Admitting: General Practice

## 2012-07-07 DIAGNOSIS — Z7901 Long term (current) use of anticoagulants: Secondary | ICD-10-CM | POA: Diagnosis not present

## 2012-07-07 DIAGNOSIS — I4891 Unspecified atrial fibrillation: Secondary | ICD-10-CM

## 2012-07-07 DIAGNOSIS — Z5181 Encounter for therapeutic drug level monitoring: Secondary | ICD-10-CM

## 2012-07-28 ENCOUNTER — Ambulatory Visit (INDEPENDENT_AMBULATORY_CARE_PROVIDER_SITE_OTHER): Payer: Medicare Other | Admitting: General Practice

## 2012-07-28 DIAGNOSIS — I4891 Unspecified atrial fibrillation: Secondary | ICD-10-CM

## 2012-07-28 DIAGNOSIS — Z7901 Long term (current) use of anticoagulants: Secondary | ICD-10-CM

## 2012-07-28 DIAGNOSIS — Z5181 Encounter for therapeutic drug level monitoring: Secondary | ICD-10-CM

## 2012-07-28 LAB — POCT INR: INR: 1.9

## 2012-08-25 ENCOUNTER — Ambulatory Visit: Payer: Medicare Other

## 2012-08-29 ENCOUNTER — Ambulatory Visit: Payer: Medicare Other

## 2012-08-29 ENCOUNTER — Ambulatory Visit (INDEPENDENT_AMBULATORY_CARE_PROVIDER_SITE_OTHER): Payer: Medicare Other | Admitting: Family Medicine

## 2012-08-29 DIAGNOSIS — Z5181 Encounter for therapeutic drug level monitoring: Secondary | ICD-10-CM

## 2012-08-29 DIAGNOSIS — I4891 Unspecified atrial fibrillation: Secondary | ICD-10-CM

## 2012-08-29 DIAGNOSIS — Z7901 Long term (current) use of anticoagulants: Secondary | ICD-10-CM | POA: Diagnosis not present

## 2012-09-01 ENCOUNTER — Other Ambulatory Visit: Payer: Self-pay

## 2012-09-01 MED ORDER — DOFETILIDE 500 MCG PO CAPS: 500.0000 ug | ORAL_CAPSULE | Freq: Two times a day (BID) | ORAL | Status: DC

## 2012-09-01 NOTE — Telephone Encounter (Signed)
dofetilide (TIKOSYN) 500 MCG capsule  Take 1 capsule (500 mcg total) by mouth 2 (two) times daily.  60 capsule  ATRIAL FIBRILLATION - [name] Salvia, MD at 12/31/2011 3:44 PM   Status: Linus Orn Related Problem: ATRIAL FIBRILLATION        Atrial fibrillation currently holding sinus rhythm on dofetilide. She is also on warfarin  We will check surveillance labs today

## 2012-09-07 ENCOUNTER — Other Ambulatory Visit: Payer: Self-pay | Admitting: *Deleted

## 2012-09-07 MED ORDER — WARFARIN SODIUM 5 MG PO TABS: ORAL_TABLET | ORAL | Status: DC

## 2012-09-08 ENCOUNTER — Encounter: Payer: Self-pay | Admitting: Internal Medicine

## 2012-09-08 ENCOUNTER — Other Ambulatory Visit: Payer: Self-pay | Admitting: *Deleted

## 2012-09-08 ENCOUNTER — Ambulatory Visit (INDEPENDENT_AMBULATORY_CARE_PROVIDER_SITE_OTHER): Payer: Medicare Other | Admitting: Internal Medicine

## 2012-09-08 VITALS — BP 117/61 | HR 62 | Ht 65.5 in | Wt 168.0 lb

## 2012-09-08 DIAGNOSIS — I429 Cardiomyopathy, unspecified: Secondary | ICD-10-CM

## 2012-09-08 DIAGNOSIS — I4891 Unspecified atrial fibrillation: Secondary | ICD-10-CM

## 2012-09-08 MED ORDER — DOFETILIDE 500 MCG PO CAPS: 500.0000 ug | ORAL_CAPSULE | Freq: Two times a day (BID) | ORAL | Status: DC

## 2012-09-08 NOTE — Progress Notes (Signed)
Patient Care Team: [name], MD as PCP - General   HPI  [name] is a 79 y.o. female seen following an atrial fibrillation ablation undertaken at [facility] in March 2010. The postprocedural interval was complicated by recurrent atrial arrhythmias prompting initiation of Tikosyn following cardioversion for atrial tachycardia. This was stopped fall 2012   Recurrent symptoms prompted [name]   to reinitiate Tikosyn. She was admitted in August 2013 and cardioverted. She reverted to atrial fibrillation and was cardioverted again.   She has been holding sinusrhythm  She just birthed great grandtwins  Past Medical History  Diagnosis Date  . TIA (transient ischemic attack)     facial numbness 2010  . Cardiomyopathy     rate-related-resolved  . Atrial fibrillation     a. s/p PVI [name] 2010;  b. on tikosyn/coumadin;  c. 05/2009 Echo: EF 60-65%, Gr 2 DD.  [name] History of mitral valve repair   . FH: colonic polyps   . Hypothyroidism   . Carotid stenosis     mild (hosp 3/11)- consult by vasc/ Dr Arbie Cookey  . Pulmonary nodule   . Depression   . Asthma   . Allergy     allergic rhinitis    Past Surgical History  Procedure Laterality Date  . Mitral valve replacement      Ring around valve mitral valve  . Pilonidal cyst removal    . Colonoscopy  2001  . Exercise stress test  2/07  . Ct of abd and pelvis  10/07    negative  . Carotid doppler  10/07    no stenosis  . Gross hematuria on anticoagulants  10/07    neg urol work up   . Admit- syncope, bradycardia      cardiac and neuro work up  . Colonoscopy- polyps  8/08  . Tia  3/11    nl imaging and ech, with mod carotid stenosis (had vasc consul  . Appendectomy      Current Outpatient Prescriptions  Medication Sig Dispense Refill  . dofetilide (TIKOSYN) 500 MCG capsule Take 1 capsule (500 mcg total) by mouth 2 (two) times daily.  180 capsule  1  . multivitamin (THERAGRAN) per tablet Take 1 tablet by mouth daily.        . NON FORMULARY  Take 1 tablet by mouth daily. Herbal supplement with herbs, chromium, and tea extracts. Taken for appetite suppressant.      . warfarin (COUMADIN) 4 MG tablet TAKE ONE TABLET BY MOUTH AS DIRECTED  90 tablet  0  . warfarin (COUMADIN) 5 MG tablet Take as directed by [facility]  90 tablet  0   No current facility-administered medications for this visit.    Allergies  Allergen Reactions  . Amiodarone Hcl     REACTION: Intolerance  . Penicillins     REACTION: rash  . Statins     REACTION: rash    Review of Systems negative except from HPI and PMH  Physical Exam BP 117/61  Pulse 62  Ht 5' 5.5" (1.664 m)  Wt 168 lb (76.204 kg)  BMI 27.52 kg/m2  Well developed and nourished in no acute distress HENT normal Neck supple with JVP-flat Clear Regular rate and rhythm, no murmurs or gallops Abd-soft with active BS No Clubbing cyanosis edema Skin-warm and dry A & Oriented  Grossly normal sensory and motor function  ecg nsr 02/29/44  Assessment and  Plan

## 2012-09-08 NOTE — Patient Instructions (Signed)
Your physician recommends that you have lab work today: bmp/magnesium  Your physician wants you to follow-up in: 6 months with Dr. Graciela Husbands. You will receive a reminder letter in the mail two months in advance. If you don't receive a letter, please call our office to schedule the follow-up appointment.  Your physician recommends that you continue on your current medications as directed. Please refer to the Current Medication list given to you today.

## 2012-09-08 NOTE — Assessment & Plan Note (Signed)
stable °

## 2012-09-08 NOTE — Assessment & Plan Note (Signed)
No recurrence  ECG ok  And will check K mg

## 2012-09-09 LAB — BASIC METABOLIC PANEL
BUN: 21 mg/dL (ref 6–23)
CO2: 29 mEq/L (ref 19–32)
Chloride: 106 mEq/L (ref 96–112)
Creatinine, Ser: 0.8 mg/dL (ref 0.4–1.2)
Potassium: 4.4 mEq/L (ref 3.5–5.1)

## 2012-09-09 LAB — MAGNESIUM: Magnesium: 2.3 mg/dL (ref 1.5–2.5)

## 2012-09-26 ENCOUNTER — Ambulatory Visit (INDEPENDENT_AMBULATORY_CARE_PROVIDER_SITE_OTHER): Payer: Medicare Other | Admitting: Family Medicine

## 2012-09-26 DIAGNOSIS — Z7901 Long term (current) use of anticoagulants: Secondary | ICD-10-CM | POA: Diagnosis not present

## 2012-09-26 DIAGNOSIS — Z5181 Encounter for therapeutic drug level monitoring: Secondary | ICD-10-CM

## 2012-09-26 DIAGNOSIS — I4891 Unspecified atrial fibrillation: Secondary | ICD-10-CM

## 2012-09-26 LAB — POCT INR: INR: 2.1

## 2012-10-24 ENCOUNTER — Ambulatory Visit (INDEPENDENT_AMBULATORY_CARE_PROVIDER_SITE_OTHER): Payer: Medicare Other | Admitting: Family Medicine

## 2012-10-24 ENCOUNTER — Ambulatory Visit: Payer: Medicare Other

## 2012-10-24 DIAGNOSIS — Z7901 Long term (current) use of anticoagulants: Secondary | ICD-10-CM

## 2012-10-24 DIAGNOSIS — I4891 Unspecified atrial fibrillation: Secondary | ICD-10-CM

## 2012-11-28 ENCOUNTER — Ambulatory Visit: Payer: Medicare Other

## 2012-12-01 ENCOUNTER — Ambulatory Visit (INDEPENDENT_AMBULATORY_CARE_PROVIDER_SITE_OTHER): Payer: Medicare Other | Admitting: Family Medicine

## 2012-12-01 DIAGNOSIS — Z7901 Long term (current) use of anticoagulants: Secondary | ICD-10-CM

## 2012-12-01 DIAGNOSIS — I4891 Unspecified atrial fibrillation: Secondary | ICD-10-CM

## 2012-12-01 DIAGNOSIS — Z5181 Encounter for therapeutic drug level monitoring: Secondary | ICD-10-CM | POA: Diagnosis not present

## 2012-12-01 LAB — POCT INR: INR: 2.3

## 2012-12-07 ENCOUNTER — Other Ambulatory Visit: Payer: Self-pay | Admitting: Family Medicine

## 2012-12-07 NOTE — Telephone Encounter (Signed)
Rout to [place] 

## 2012-12-13 ENCOUNTER — Other Ambulatory Visit: Payer: Self-pay | Admitting: Family Medicine

## 2012-12-13 NOTE — Telephone Encounter (Signed)
Rout to [place] 

## 2012-12-17 ENCOUNTER — Emergency Department: Payer: Self-pay | Admitting: Emergency Medicine

## 2012-12-17 DIAGNOSIS — I4891 Unspecified atrial fibrillation: Secondary | ICD-10-CM | POA: Diagnosis not present

## 2012-12-17 DIAGNOSIS — R0789 Other chest pain: Secondary | ICD-10-CM | POA: Diagnosis not present

## 2012-12-17 LAB — BASIC METABOLIC PANEL
Anion Gap: 5 — ABNORMAL LOW (ref 7–16)
Calcium, Total: 9.2 mg/dL (ref 8.5–10.1)
Co2: 26 mmol/L (ref 21–32)
Creatinine: 0.82 mg/dL (ref 0.60–1.30)
EGFR (African American): >60
Glucose: 91 mg/dL (ref 65–99)
Sodium: 140 mmol/L (ref 136–145)

## 2012-12-17 LAB — PROTIME-INR
INR: 2
Prothrombin Time: 22.1 secs — ABNORMAL HIGH (ref 11.5–14.7)

## 2012-12-17 LAB — CBC
HCT: 38.8 % (ref 35.0–47.0)
HGB: 13.3 g/dL (ref 12.0–16.0)
MCV: 84 fL (ref 80–100)
RDW: 14.9 % — ABNORMAL HIGH (ref 11.5–14.5)
WBC: 4.9 10*3/uL (ref 3.6–11.0)

## 2012-12-19 ENCOUNTER — Telehealth: Payer: Self-pay

## 2012-12-19 NOTE — Telephone Encounter (Signed)
LMTCB to schedule with Dr Graciela Husbands

## 2012-12-27 ENCOUNTER — Encounter: Payer: Self-pay | Admitting: Internal Medicine

## 2012-12-27 ENCOUNTER — Ambulatory Visit (INDEPENDENT_AMBULATORY_CARE_PROVIDER_SITE_OTHER): Payer: Medicare Other | Admitting: Internal Medicine

## 2012-12-27 ENCOUNTER — Encounter: Payer: Self-pay | Admitting: *Deleted

## 2012-12-27 VITALS — BP 105/70 | HR 116 | Ht 65.5 in | Wt 170.0 lb

## 2012-12-27 DIAGNOSIS — I509 Heart failure, unspecified: Secondary | ICD-10-CM

## 2012-12-27 DIAGNOSIS — I429 Cardiomyopathy, unspecified: Secondary | ICD-10-CM | POA: Diagnosis not present

## 2012-12-27 DIAGNOSIS — I4891 Unspecified atrial fibrillation: Secondary | ICD-10-CM | POA: Diagnosis not present

## 2012-12-27 DIAGNOSIS — I5030 Unspecified diastolic (congestive) heart failure: Secondary | ICD-10-CM | POA: Insufficient documentation

## 2012-12-27 NOTE — Patient Instructions (Addendum)
Your physician recommends that you have lab work today: INR/BMP/CBC  Your physician has recommended a cardioversion with Dr. Ree Kida at hospital at 6:30am  Procedure scheduled for 7:30 am  Your physician recommends that you continue on your current medications as directed. Please refer to the Current Medication list given to you today.

## 2012-12-27 NOTE — Assessment & Plan Note (Signed)
Heart failure in the context of atrial fibrillation. We will work on an urgent cardioversion.

## 2012-12-27 NOTE — Progress Notes (Signed)
Patient Care Team: [name], MD as PCP - General   HPI  [name] is a 79 y.o. female seen following an atrial fibrillation ablation undertaken at [facility] in March 2010. The postprocedural interval was complicated by recurrent atrial arrhythmias prompting initiation of Tikosyn following cardioversion for atrial tachycardia. This was stopped fall 2012 Recurrent symptoms prompted [name] to reinitiate Tikosyn. She was admitted in August 2013 and cardioverted. She reverted to atrial fibrillation and was cardioverted again.   She has been in atrial fibrillation persistently we think for a month or 2. This is manifested by dyspnea on exertion and tachypalpitations. She was seen in the emergency room at [facility] 10 days ago at which point her INR was 2.0 her potassium level was 4.4.  There is a significant amount of stress at home with related to her mother  Past Medical History  Diagnosis Date  . TIA (transient ischemic attack)     facial numbness 2010  . Cardiomyopathy     rate-related-resolved  . Atrial fibrillation     a. s/p PVI [name] 2010;  b. on tikosyn/coumadin;  c. 05/2009 Echo: EF 60-65%, Gr 2 DD.  [name] History of mitral valve repair   . FH: colonic polyps   . Hypothyroidism   . Carotid stenosis     mild (hosp 3/11)- consult by vasc/ Dr Arbie Cookey  . Pulmonary nodule   . Depression   . Asthma   . Allergy     allergic rhinitis    Past Surgical History  Procedure Laterality Date  . Mitral valve replacement      Ring around valve mitral valve  . Pilonidal cyst removal    . Colonoscopy  2001  . Exercise stress test  2/07  . Ct of abd and pelvis  10/07    negative  . Carotid doppler  10/07    no stenosis  . Gross hematuria on anticoagulants  10/07    neg urol work up   . Admit- syncope, bradycardia      cardiac and neuro work up  . Colonoscopy- polyps  8/08  . Tia  3/11    nl imaging and ech, with mod carotid stenosis (had vasc consul  . Appendectomy      Current Outpatient  Prescriptions  Medication Sig Dispense Refill  . dofetilide (TIKOSYN) 500 MCG capsule Take 1 capsule (500 mcg total) by mouth 2 (two) times daily.  180 capsule  1  . multivitamin (THERAGRAN) per tablet Take 1 tablet by mouth daily.        . NON FORMULARY Take 1 tablet by mouth daily. Herbal supplement with herbs, chromium, and tea extracts. Taken for appetite suppressant.      . warfarin (COUMADIN) 4 MG tablet TAKE ONE TABLET BY MOUTH AS DIRECTED  90 tablet  0  . warfarin (COUMADIN) 5 MG tablet TAKE AS DIRECTED BY COUMADIN CLINIC  90 tablet  0  . warfarin (COUMADIN) 5 MG tablet TAKE AS DIRECTED BY COUMADIN CLINIC  90 tablet  0   No current facility-administered medications for this visit.    Allergies  Allergen Reactions  . Amiodarone Hcl     REACTION: Intolerance  . Penicillins     REACTION: rash  . Statins     REACTION: rash    Review of Systems negative except from HPI and PMH  Physical Exam Ht 5' 5.5" (1.664 m)  Wt 170 lb (77.111 kg)  BMI 27.85 kg/m2 Well developed and nourished in no  acute distress HENT normal Neck supple with JVP-flat Carotids brisk and full without bruits Clear Irregularly irregular rate and rhythm with rapid ventricular response, no murmurs or gallops Abd-soft with active BS without hepatomegaly No Clubbing cyanosis edema Skin-warm and dry A & Oriented  Grossly normal sensory and motor function  ECG demonstrates atrial fibrillation 11  e intervals-/09/37 QTC of about 500   Assessment and  Plan

## 2012-12-27 NOTE — Assessment & Plan Note (Signed)
She has persistent atrial fibrillation. We'll undertake cardioversion. Her last INR was 2.0 it will needto be reassessed.

## 2012-12-28 LAB — CBC
MCH: 27.8 pg (ref 26.6–33.0)
MCHC: 33.4 g/dL (ref 31.5–35.7)
Platelets: 138 10*3/uL — ABNORMAL LOW (ref 150–379)
RDW: 15.3 % (ref 12.3–15.4)

## 2012-12-28 LAB — BASIC METABOLIC PANEL
BUN/Creatinine Ratio: 23 (ref 11–26)
CO2: 26 mmol/L (ref 18–29)
Calcium: 9.2 mg/dL (ref 8.6–10.2)
Chloride: 103 mmol/L (ref 97–108)
Sodium: 144 mmol/L (ref 134–144)

## 2012-12-28 LAB — PROTIME-INR
INR: 1.9 — ABNORMAL HIGH (ref 0.8–1.2)
Prothrombin Time: 19.9 s — ABNORMAL HIGH (ref 9.1–12.0)

## 2012-12-29 ENCOUNTER — Other Ambulatory Visit: Payer: Self-pay | Admitting: Family Medicine

## 2012-12-29 ENCOUNTER — Ambulatory Visit (INDEPENDENT_AMBULATORY_CARE_PROVIDER_SITE_OTHER): Payer: Medicare Other | Admitting: Family Medicine

## 2012-12-29 ENCOUNTER — Ambulatory Visit: Payer: Medicare Other

## 2012-12-29 ENCOUNTER — Telehealth: Payer: Self-pay

## 2012-12-29 DIAGNOSIS — Z7901 Long term (current) use of anticoagulants: Secondary | ICD-10-CM | POA: Diagnosis not present

## 2012-12-29 DIAGNOSIS — Z5181 Encounter for therapeutic drug level monitoring: Secondary | ICD-10-CM | POA: Diagnosis not present

## 2012-12-29 DIAGNOSIS — I4891 Unspecified atrial fibrillation: Secondary | ICD-10-CM | POA: Diagnosis not present

## 2012-12-29 LAB — POCT INR: INR: 2.5

## 2012-12-29 MED ORDER — WARFARIN SODIUM 1 MG PO TABS: 1.0000 mg | ORAL_TABLET | ORAL | Status: DC

## 2012-12-29 NOTE — Telephone Encounter (Signed)
Lmom for pt to call back.   Pt was sched to go to [facility] this am for coumadin check, but pt did not show. Pt is sched for cardioversion on Mon 10/13, but INR was not therapeutic at 1.9 on 12/27/12.

## 2012-12-29 NOTE — Telephone Encounter (Signed)
Will need tee

## 2013-01-02 ENCOUNTER — Telehealth: Payer: Self-pay

## 2013-01-02 ENCOUNTER — Ambulatory Visit: Payer: Self-pay | Admitting: Cardiovascular Disease

## 2013-01-02 DIAGNOSIS — Z8673 Personal history of transient ischemic attack (TIA), and cerebral infarction without residual deficits: Secondary | ICD-10-CM | POA: Diagnosis not present

## 2013-01-02 DIAGNOSIS — Z8371 Family history of colonic polyps: Secondary | ICD-10-CM | POA: Diagnosis not present

## 2013-01-02 DIAGNOSIS — I4891 Unspecified atrial fibrillation: Secondary | ICD-10-CM

## 2013-01-02 DIAGNOSIS — I428 Other cardiomyopathies: Secondary | ICD-10-CM | POA: Diagnosis not present

## 2013-01-02 DIAGNOSIS — F329 Major depressive disorder, single episode, unspecified: Secondary | ICD-10-CM | POA: Diagnosis not present

## 2013-01-02 DIAGNOSIS — I059 Rheumatic mitral valve disease, unspecified: Secondary | ICD-10-CM | POA: Diagnosis not present

## 2013-01-02 DIAGNOSIS — E039 Hypothyroidism, unspecified: Secondary | ICD-10-CM | POA: Diagnosis not present

## 2013-01-02 DIAGNOSIS — I429 Cardiomyopathy, unspecified: Secondary | ICD-10-CM | POA: Diagnosis not present

## 2013-01-02 DIAGNOSIS — J159 Unspecified bacterial pneumonia: Secondary | ICD-10-CM | POA: Diagnosis not present

## 2013-01-02 DIAGNOSIS — J45909 Unspecified asthma, uncomplicated: Secondary | ICD-10-CM | POA: Diagnosis not present

## 2013-01-02 DIAGNOSIS — I6529 Occlusion and stenosis of unspecified carotid artery: Secondary | ICD-10-CM | POA: Diagnosis not present

## 2013-01-02 DIAGNOSIS — Z79899 Other long term (current) drug therapy: Secondary | ICD-10-CM | POA: Diagnosis not present

## 2013-01-02 DIAGNOSIS — G4700 Insomnia, unspecified: Secondary | ICD-10-CM | POA: Diagnosis not present

## 2013-01-02 DIAGNOSIS — Z7901 Long term (current) use of anticoagulants: Secondary | ICD-10-CM | POA: Diagnosis not present

## 2013-01-02 NOTE — Telephone Encounter (Signed)
LMOM for pt to call & sched 1 wk f/u & coumadin check, per Dr. Kirke Corin. Pt had TEE this morning.

## 2013-01-03 ENCOUNTER — Encounter: Payer: Self-pay | Admitting: Cardiovascular Disease

## 2013-01-04 NOTE — Progress Notes (Signed)
Please review for increase in coumadin.

## 2013-01-09 ENCOUNTER — Ambulatory Visit: Payer: Medicare Other | Admitting: Cardiovascular Disease

## 2013-01-10 ENCOUNTER — Encounter: Payer: Self-pay | Admitting: Cardiovascular Disease

## 2013-01-10 ENCOUNTER — Ambulatory Visit (INDEPENDENT_AMBULATORY_CARE_PROVIDER_SITE_OTHER): Payer: Medicare Other | Admitting: Cardiovascular Disease

## 2013-01-10 VITALS — BP 94/68 | HR 124 | Ht 65.5 in | Wt 168.0 lb

## 2013-01-10 DIAGNOSIS — I429 Cardiomyopathy, unspecified: Secondary | ICD-10-CM | POA: Diagnosis not present

## 2013-01-10 DIAGNOSIS — H2510 Age-related nuclear cataract, unspecified eye: Secondary | ICD-10-CM | POA: Diagnosis not present

## 2013-01-10 DIAGNOSIS — R079 Chest pain, unspecified: Secondary | ICD-10-CM | POA: Diagnosis not present

## 2013-01-10 DIAGNOSIS — I4891 Unspecified atrial fibrillation: Secondary | ICD-10-CM | POA: Diagnosis not present

## 2013-01-10 DIAGNOSIS — H524 Presbyopia: Secondary | ICD-10-CM | POA: Diagnosis not present

## 2013-01-10 NOTE — Assessment & Plan Note (Signed)
Unfortunately, the patient is back in A. fib with RVR in spite of being on full dose dofetilide and recent cardioversion. Due to cardiomyopathy, an antiarrhythmic medications are limited to amiodarone and dofetilide. There is reported allergy to amiodarone. Blood pressure is too low to allow adding an AV nodal blocking agent.  I discussed the case with Dr. Graciela Husbands who will see the patient on Thursday. The best option might be AV nodal ablation and biventricular pacemaker placement.

## 2013-01-10 NOTE — Assessment & Plan Note (Signed)
She has tachycardia-induced cardiomyopathy with recent ejection fraction of 35-40% on TEE.  I will request a transthoracic echocardiogram to better evaluate atrial size.

## 2013-01-10 NOTE — Progress Notes (Signed)
HPI  This is a pleasant 79 year old female who is here today for a followup visit after recent TEE/DCCV. She has known history of persistent atrial fibrillation status post atrial fibrillation ablation undertaken at [facility] in March 2010.  She also reports 2 previous ablations in [place]. She had recurrent atrial arrhythmia and was placed back on  Tikosyn. She has known history of tachycardia-induced cardiomyopathy. She was noted to be in A. fib with RVR recently and thus was referred for TEE guided DC cardioversion given her slightly subtherapeutic INR. During TEE, she was tachycardic with a heart rate of 150 beats per minute. LV systolic function appeared to be moderately reduced at 35-40%. There was mild biatrial enlargement. She had spontaneous atrial contrast but no thrombus. She underwent cardioversion to sinus rhythm. She did have respiratory depression with propofol which required Brief manual ventilation. She has been doing reasonably well. She is back in atrial fibrillation. She is still under stress at home.   Allergies  Allergen Reactions  . Amiodarone Hcl     REACTION: Intolerance  . Penicillins     REACTION: rash  . Statins     REACTION: rash     Current Outpatient Prescriptions on File Prior to Visit  Medication Sig Dispense Refill  . dofetilide (TIKOSYN) 500 MCG capsule Take 1 capsule (500 mcg total) by mouth 2 (two) times daily.  180 capsule  1  . multivitamin (THERAGRAN) per tablet Take 1 tablet by mouth daily.        . NON FORMULARY Take 1 tablet by mouth daily. Herbal supplement with herbs, chromium, and tea extracts. Taken for appetite suppressant.      . warfarin (COUMADIN) 1 MG tablet Take 1 tablet (1 mg total) by mouth as directed.  30 tablet  2  . warfarin (COUMADIN) 4 MG tablet TAKE ONE TABLET BY MOUTH AS DIRECTED  90 tablet  0  . warfarin (COUMADIN) 5 MG tablet TAKE AS DIRECTED BY COUMADIN CLINIC  90 tablet  0   No current facility-administered medications on  file prior to visit.     Past Medical History  Diagnosis Date  . TIA (transient ischemic attack)     facial numbness 2010  . Cardiomyopathy     rate-related-resolved  . Atrial fibrillation     a. s/p PVI [name] 2010;  b. on tikosyn/coumadin;  c. 05/2009 Echo: EF 60-65%, Gr 2 DD.  [name] History of mitral valve repair   . FH: colonic polyps   . Hypothyroidism   . Carotid stenosis     mild (hosp 3/11)- consult by vasc/ Dr Arbie Cookey  . Pulmonary nodule   . Depression   . Asthma   . Allergy     allergic rhinitis  . Hyperlipidemia   . Syncope and collapse      Past Surgical History  Procedure Laterality Date  . Mitral valve replacement      Ring around valve mitral valve  . Pilonidal cyst removal    . Colonoscopy  2001  . Exercise stress test  2/07  . Ct of abd and pelvis  10/07    negative  . Carotid doppler  10/07    no stenosis  . Gross hematuria on anticoagulants  10/07    neg urol work up   . Admit- syncope, bradycardia      cardiac and neuro work up  . Colonoscopy- polyps  8/08  . Tia  3/11    nl imaging and ech, with mod carotid  stenosis (had vasc consul  . Appendectomy       Family History  Problem Relation Age of Onset  . Hypertension Mother   . Lung cancer Father     smoker  . Alcohol abuse Father   . Cancer Father     bladder and lung CA smoker     History   Social History  . Marital Status: Widowed    Spouse Name: N/A    Number of Children: 6  . Years of Education: N/A   Occupational History  .     Social History Main Topics  . Smoking status: Never Smoker   . Smokeless tobacco: Never Used  . Alcohol Use: Yes     Comment: occasional glass of winre  . Drug Use: No  . Sexual Activity: Not on file   Other Topics Concern  . Not on file   Social History Narrative   Retired. Daily Caffeine use: 2 daily        PHYSICAL EXAM   BP 94/68  Pulse 124  Ht 5' 5.5" (1.664 m)  Wt 168 lb (76.204 kg)  BMI 27.52 kg/m2 Constitutional: She is  oriented to person, place, and time. She appears well-developed and well-nourished. No distress.  HENT: No nasal discharge.  Head: Normocephalic and atraumatic.  Eyes: Pupils are equal and round. No discharge.  Neck: Normal range of motion. Neck supple. No JVD present. No thyromegaly present.  Cardiovascular: Tachycardic, irregular rhythm, normal heart sounds. Exam reveals no gallop and no friction rub. No murmur heard.  Pulmonary/Chest: Effort normal and breath sounds normal. No stridor. No respiratory distress. She has no wheezes. She has no rales. She exhibits no tenderness.  Abdominal: Soft. Bowel sounds are normal. She exhibits no distension. There is no tenderness. There is no rebound and no guarding.  Musculoskeletal: Normal range of motion. She exhibits no edema and no tenderness.  Neurological: She is alert and oriented to person, place, and time. Coordination normal.  Skin: Skin is warm and dry. No rash noted. She is not diaphoretic. No erythema. No pallor.  Psychiatric: She has a normal mood and affect. Her behavior is normal. Judgment and thought content normal.     VHQ:IONGEX fibrillation with rapid ventricular response ABNORMAL RHYTHM   ASSESSMENT AND PLAN

## 2013-01-10 NOTE — Patient Instructions (Signed)
Your physician has requested that you have an echocardiogram. Echocardiography is a painless test that uses sound waves to create images of your heart. It provides your doctor with information about the size and shape of your heart and how well your heart's chambers and valves are working. This procedure takes approximately one hour. There are no restrictions for this procedure.  Follow up with Dr. Graciela Husbands on Thursday.

## 2013-01-12 ENCOUNTER — Encounter: Payer: Self-pay | Admitting: Internal Medicine

## 2013-01-12 ENCOUNTER — Ambulatory Visit (INDEPENDENT_AMBULATORY_CARE_PROVIDER_SITE_OTHER): Payer: Medicare Other | Admitting: Internal Medicine

## 2013-01-12 VITALS — BP 134/86 | HR 114 | Ht 65.5 in | Wt 171.0 lb

## 2013-01-12 DIAGNOSIS — R079 Chest pain, unspecified: Secondary | ICD-10-CM

## 2013-01-12 DIAGNOSIS — I4891 Unspecified atrial fibrillation: Secondary | ICD-10-CM

## 2013-01-12 MED ORDER — DIGOXIN 125 MCG PO TABS: ORAL_TABLET | ORAL | Status: DC

## 2013-01-12 MED ORDER — DILTIAZEM HCL ER COATED BEADS 180 MG PO CP24: 180.0000 mg | ORAL_CAPSULE | Freq: Every day | ORAL | Status: DC

## 2013-01-12 NOTE — Progress Notes (Signed)
Patient Care Team: [name], MD as PCP - General   HPI  [name] is a 79 y.o. female seen following an atrial fibrillation ablation undertaken at [facility] in March 2010. The postprocedural interval was complicated by recurrent atrial arrhythmias prompting initiation of Tikosyn following cardioversion for atrial tachycardia. This was stopped fall 2012 Recurrent symptoms prompted [name] to reinitiate Tikosyn.  sHe's had recurrent episodes of atrial fibrillation associated with a rapid ventricular response. Most recently her ejection fraction has been noted to be 35-40%. This is new in the last 2-to 3 years. She underwent cardioversion 2 weeks ago and has reverted again to atrial fibrillation  She has been intolerant of amiodarone in the past and alopecia complicated beta blocker therapy.  Fatigue and exercise intolerance continues to be an issue.          Past Medical History  Diagnosis Date  . TIA (transient ischemic attack)     facial numbness 2010  . Cardiomyopathy     rate-related-resolved  . Atrial fibrillation     a. s/p PVI [name] 2010;  b. on tikosyn/coumadin;  c. 05/2009 Echo: EF 60-65%, Gr 2 DD.  [name] History of mitral valve repair   . FH: colonic polyps   . Hypothyroidism   . Carotid stenosis     mild (hosp 3/11)- consult by vasc/ Dr Arbie Cookey  . Pulmonary nodule   . Depression   . Asthma   . Allergy     allergic rhinitis  . Hyperlipidemia   . Syncope and collapse     Past Surgical History  Procedure Laterality Date  . Mitral valve replacement      Ring around valve mitral valve  . Pilonidal cyst removal    . Colonoscopy  2001  . Exercise stress test  2/07  . Ct of abd and pelvis  10/07    negative  . Carotid doppler  10/07    no stenosis  . Gross hematuria on anticoagulants  10/07    neg urol work up   . Admit- syncope, bradycardia      cardiac and neuro work up  . Colonoscopy- polyps  8/08  . Tia  3/11    nl imaging and ech, with mod carotid stenosis (had  vasc consul  . Appendectomy      Current Outpatient Prescriptions  Medication Sig Dispense Refill  . dofetilide (TIKOSYN) 500 MCG capsule Take 1 capsule (500 mcg total) by mouth 2 (two) times daily.  180 capsule  1  . multivitamin (THERAGRAN) per tablet Take 1 tablet by mouth daily.        . NON FORMULARY Take 1 tablet by mouth daily. Herbal supplement with herbs, chromium, and tea extracts. Taken for appetite suppressant.      . warfarin (COUMADIN) 1 MG tablet Take 1 tablet (1 mg total) by mouth as directed.  30 tablet  2  . warfarin (COUMADIN) 4 MG tablet TAKE ONE TABLET BY MOUTH AS DIRECTED  90 tablet  0  . warfarin (COUMADIN) 5 MG tablet TAKE AS DIRECTED BY COUMADIN CLINIC  90 tablet  0   No current facility-administered medications for this visit.    Allergies  Allergen Reactions  . Amiodarone Hcl     REACTION: Intolerance  . Penicillins     REACTION: rash  . Statins     REACTION: rash    Review of Systems negative except from HPI and PMH  Physical Exam BP 134/86  Pulse 114  Ht  5' 5.5" (1.664 m)  Wt 171 lb (77.565 kg)  BMI 28.01 kg/m2 Well developed and nourished in no acute distress HENT normal Neck supple with JVP-80-9 cm Carotids brisk and full without bruits Clear Irregularly irregular rate and rhythm with rapid ventricular response, no murmurs or gallops Abd-soft with active BS without hepatomegaly No Clubbing cyanosis edema Skin-warm and dry A & Oriented  Grossly normal sensory and motor function  ECG demonstrates atrial fibrillation 114    intervals-/09/37 QTC of about 500   Assessment and  Plan

## 2013-01-12 NOTE — Assessment & Plan Note (Signed)
This is likely related to rate. She's been intolerant of beta blockers. Arrange how to use our blood pressure to undertake rate control. If LV dysfunction persists we will put her on ACE inhibitors if blood pressure allows. We'll also use digoxin for control atrial fibrillation rate. Will anticipate a Holter in about 3-4 weeks with further drug adjustments as necessary.  We have discussed the role of AV junction ablation and CRT-P. Implantation at this point she would like not to do that as she is concerned about device dependence. We also raised the possibility of atrial fibrillation re ablation   She is quite concerned about her cardiomyopathy. We will plan reassessment of left ventricular function once we had demonstrated control of heart rate.

## 2013-01-12 NOTE — Patient Instructions (Addendum)
Your physician has recommended you make the following change in your medication:  1) Stop Tikosyn 2) Start Cardizem (Diltiazem) 180 mg once daily 3) Start Digoxin (lanoxin) 0.125 mg every 6 hours x 4 doses, then take 0.125 mg once daily  Your physician recommends that you return for lab work in: 2 weeks- bmp/ digoxin  EKG in 2 weeks when you return for labwork.  Your physician has recommended that you wear a 24 hour holter monitor in 4 weeks. Holter monitors are medical devices that record the heart's electrical activity. Doctors most often use these monitors to diagnose arrhythmias. Arrhythmias are problems with the speed or rhythm of the heartbeat. The monitor is a small, portable device. You can wear one while you do your normal daily activities. This is usually used to diagnose what is causing palpitations/syncope (passing out).  Your physician recommends that you schedule a follow-up appointment in: 5 weeks

## 2013-01-12 NOTE — Assessment & Plan Note (Addendum)
Functionally persistent at this point. Will discontinue her dofetilide.  Other options are as outlined above  I reviewed the above with her and her son

## 2013-01-13 ENCOUNTER — Other Ambulatory Visit: Payer: Self-pay

## 2013-01-13 ENCOUNTER — Other Ambulatory Visit (INDEPENDENT_AMBULATORY_CARE_PROVIDER_SITE_OTHER): Payer: Medicare Other

## 2013-01-13 DIAGNOSIS — I4891 Unspecified atrial fibrillation: Secondary | ICD-10-CM

## 2013-01-13 DIAGNOSIS — R079 Chest pain, unspecified: Secondary | ICD-10-CM | POA: Diagnosis not present

## 2013-01-17 ENCOUNTER — Other Ambulatory Visit: Payer: Medicare Other

## 2013-01-18 ENCOUNTER — Telehealth: Payer: Self-pay

## 2013-01-18 NOTE — Telephone Encounter (Signed)
Message copied by Marilynne Halsted on Wed Jan 18, 2013 11:46 AM ------      Message from: [name] Salvia      Created: Tue Jan 17, 2013  5:12 PM       Please Inform Patient  improved LV funciton ------

## 2013-01-18 NOTE — Telephone Encounter (Signed)
Left detailed message on pt's voicemail with results.  Asked her to call back with any questions or concerns.

## 2013-01-25 ENCOUNTER — Ambulatory Visit (INDEPENDENT_AMBULATORY_CARE_PROVIDER_SITE_OTHER): Payer: Medicare Other

## 2013-01-25 VITALS — Ht 65.5 in | Wt 175.5 lb

## 2013-01-25 DIAGNOSIS — Z7901 Long term (current) use of anticoagulants: Secondary | ICD-10-CM

## 2013-01-25 DIAGNOSIS — R079 Chest pain, unspecified: Secondary | ICD-10-CM

## 2013-01-25 DIAGNOSIS — I4891 Unspecified atrial fibrillation: Secondary | ICD-10-CM

## 2013-01-25 DIAGNOSIS — Z5181 Encounter for therapeutic drug level monitoring: Secondary | ICD-10-CM

## 2013-01-25 NOTE — Progress Notes (Signed)
Pt came into office today for EKG.  Pt states that she feel good since her med change. Reports a minor headache that lasted about 2 days when she started digoxin, but it has resolved. Denies any change in vision. Pt to return in 2 weeks for 24 hr monitor.

## 2013-01-26 ENCOUNTER — Ambulatory Visit (INDEPENDENT_AMBULATORY_CARE_PROVIDER_SITE_OTHER): Payer: Medicare Other | Admitting: Family Medicine

## 2013-01-26 DIAGNOSIS — Z7901 Long term (current) use of anticoagulants: Secondary | ICD-10-CM

## 2013-01-26 DIAGNOSIS — I4891 Unspecified atrial fibrillation: Secondary | ICD-10-CM

## 2013-01-26 LAB — BASIC METABOLIC PANEL
BUN/Creatinine Ratio: 29 — ABNORMAL HIGH (ref 11–26)
CO2: 23 mmol/L (ref 18–29)
Calcium: 9.5 mg/dL (ref 8.6–10.2)
Chloride: 101 mmol/L (ref 97–108)
GFR calc non Af Amer: 87 mL/min/{1.73_m2} (ref >59)
Glucose: 84 mg/dL (ref 65–99)
Potassium: 5.2 mmol/L (ref 3.5–5.2)

## 2013-01-26 LAB — POCT INR: INR: 2.7

## 2013-01-26 LAB — DIGOXIN LEVEL: Digoxin Level: 1.2 ng/mL (ref 0.9–2.0)

## 2013-02-09 ENCOUNTER — Ambulatory Visit (INDEPENDENT_AMBULATORY_CARE_PROVIDER_SITE_OTHER): Payer: Medicare Other | Admitting: *Deleted

## 2013-02-09 DIAGNOSIS — I4891 Unspecified atrial fibrillation: Secondary | ICD-10-CM

## 2013-02-09 DIAGNOSIS — R55 Syncope and collapse: Secondary | ICD-10-CM

## 2013-02-09 NOTE — Progress Notes (Signed)
Placed 24 hour holter monitor per Graciela Husbands. 02/09/13-02/10/13 @8 :45 am.

## 2013-02-23 ENCOUNTER — Encounter: Payer: Self-pay | Admitting: Internal Medicine

## 2013-02-23 ENCOUNTER — Ambulatory Visit (INDEPENDENT_AMBULATORY_CARE_PROVIDER_SITE_OTHER): Payer: Medicare Other | Admitting: Internal Medicine

## 2013-02-23 ENCOUNTER — Other Ambulatory Visit: Payer: Self-pay | Admitting: *Deleted

## 2013-02-23 VITALS — BP 100/80 | HR 88 | Ht 65.5 in | Wt 172.0 lb

## 2013-02-23 DIAGNOSIS — I4891 Unspecified atrial fibrillation: Secondary | ICD-10-CM

## 2013-02-23 DIAGNOSIS — I429 Cardiomyopathy, unspecified: Secondary | ICD-10-CM | POA: Diagnosis not present

## 2013-02-23 MED ORDER — DIGOXIN 0.0625 MG HALF TABLET: 0.0625 mg | ORAL_TABLET | Freq: Every day | ORAL | Status: DC

## 2013-02-23 NOTE — Progress Notes (Signed)
Patient Care Team: [name], MD as PCP - General   HPI  [name] is a 79 y.o. female seen following an atrial flutter ablation related to prior atriotomy undertaken at [facility] in March 2010. The postprocedural interval was complicated by recurrent atrial arrhythmias prompting initiation of Tikosyn following cardioversion for atrial tachycardia. This was stopped fall 2012 Recurrent symptoms prompted [name] to reinitiate Tikosyn. sHe's had recurrent episodes of atrial fibrillation associated with a rapid ventricular response.   She underwent cardioversion 2 weeks ago and has reverted again to atrial fibrillation   Her ejection fraction initially was about 35-40% and repeat assessment in a just a couple of weeks later demonstrated 55-60% with improved rate control following the initiation of digoxin.  She is feeling considerably better with less fatigue and less exercise intolerance.  She is hanging in there with her mother.   She has been intolerant of amiodarone in the past and alopecia complicated beta blocker therapy.  Fatigue and exercise intolerance continues to be an issue.   Past Medical History  Diagnosis Date  . TIA (transient ischemic attack)     facial numbness 2010  . Cardiomyopathy     rate-related-resolved  . Atrial fibrillation -persistent     a. s/p PVI [name] 2010;  b. on tikosyn/coumadin;  c. 05/2009 Echo: EF 60-65%, Gr 2 DD.  [name] History of mitral valve repair   . FH: colonic polyps   . Hypothyroidism   . Carotid stenosis     mild (hosp 3/11)- consult by vasc/ Dr Arbie Cookey  . Pulmonary nodule   . Depression   . Asthma   . Allergy     allergic rhinitis  . Hyperlipidemia   . Syncope and collapse   . 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 Mitral valve replacement      Ring around valve mitral valve  . Pilonidal cyst removal    . Colonoscopy  2001  . Exercise stress test  2/07  . Ct of abd and pelvis   10/07    negative  . Carotid doppler  10/07    no stenosis  . Gross hematuria on anticoagulants  10/07    neg urol work up   . Admit- syncope, bradycardia      cardiac and neuro work up  . Colonoscopy- polyps  8/08  . Tia  3/11    nl imaging and ech, with mod carotid stenosis (had vasc consul  . Appendectomy      Current Outpatient Prescriptions  Medication Sig Dispense Refill  . digoxin (LANOXIN) 0.125 MG tablet Take 0.125 mg by mouth daily.      [name] diltiazem (CARDIZEM CD) 180 MG 24 hr capsule Take 1 capsule (180 mg total) by mouth daily.  30 capsule  6  . multivitamin (THERAGRAN) per tablet Take 1 tablet by mouth daily.        . NON FORMULARY Take 1 tablet by mouth daily. Herbal supplement with herbs, chromium, and tea extracts. Taken for appetite suppressant.      . warfarin (COUMADIN) 1 MG tablet Take 1 tablet (1 mg total) by mouth as directed.  30 tablet  2  . warfarin (COUMADIN) 4 MG tablet TAKE ONE TABLET BY MOUTH AS DIRECTED  90 tablet  0  . warfarin (COUMADIN) 5 MG tablet TAKE AS DIRECTED BY COUMADIN CLINIC  90 tablet  0   No current facility-administered  medications for this visit.    Allergies  Allergen Reactions  . Amiodarone Hcl     REACTION: Intolerance  . Penicillins     REACTION: rash  . Statins     REACTION: rash    Review of Systems negative except from HPI and PMH  Physical Exam BP 100/80  Pulse 88  Ht 5' 5.5" (1.664 m)  Wt 172 lb (78.019 kg)  BMI 28.18 kg/m2 Well developed and well nourished in no acute distress HENT normal E scleral and icterus clear Neck Supple JVP flat; carotids brisk and full Clear to ausculation  Irregularly irregular rate and rhythm, no murmurs gallops or rub Soft with active bowel sounds No clubbing cyanosis none Edema Alert and oriented, grossly normal motor and sensory function Skin Warm and Dry  Holter monitor demonstrated heart rate excursion from 73-120. Max in all hours but 1 was less than 110 beats per  minute  Assessment and  Plan

## 2013-02-23 NOTE — Assessment & Plan Note (Signed)
Now permanent. Heart rate is adequately controlled. I am not a great candidate digoxin but it has been very helpful in augmented rate control and she is intolerant of beta blockers.  We will check a digoxin level and potassium level today.  We have again reviewed NOACs versus warfarin she would like to continue on her warfarin

## 2013-02-23 NOTE — Patient Instructions (Signed)
Your physician recommends that you have lab work today: 1. Digoxin level  2.Basic metabolic panel   Your physician wants you to follow-up in: 6 months. You will receive a reminder letter in the mail two months in advance. If you don't receive a letter, please call our office to schedule the follow-up appointment.   Your physician recommends that you continue on your current medications as directed. Please refer to the Current Medication list given to you today.

## 2013-02-23 NOTE — Assessment & Plan Note (Signed)
Euvolemic. Continue current meds 

## 2013-02-23 NOTE — Assessment & Plan Note (Signed)
His blood pressure allows we'll try to add an ACE inhibitor to try to maintain recovery

## 2013-02-24 LAB — BASIC METABOLIC PANEL
CO2: 26 mmol/L (ref 18–29)
Calcium: 9.2 mg/dL (ref 8.6–10.2)
Chloride: 103 mmol/L (ref 97–108)
Glucose: 85 mg/dL (ref 65–99)
Potassium: 4.8 mmol/L (ref 3.5–5.2)

## 2013-02-24 LAB — DIGOXIN LEVEL: Digoxin Level: 0.4 ng/mL — ABNORMAL LOW (ref 0.9–2.0)

## 2013-02-28 ENCOUNTER — Telehealth: Payer: Self-pay | Admitting: Family Medicine

## 2013-02-28 ENCOUNTER — Emergency Department: Payer: Self-pay | Admitting: Emergency Medicine

## 2013-02-28 ENCOUNTER — Ambulatory Visit (INDEPENDENT_AMBULATORY_CARE_PROVIDER_SITE_OTHER): Payer: Medicare Other

## 2013-02-28 DIAGNOSIS — Z7901 Long term (current) use of anticoagulants: Secondary | ICD-10-CM | POA: Diagnosis not present

## 2013-02-28 DIAGNOSIS — S20219A Contusion of unspecified front wall of thorax, initial encounter: Secondary | ICD-10-CM | POA: Diagnosis not present

## 2013-02-28 DIAGNOSIS — I4891 Unspecified atrial fibrillation: Secondary | ICD-10-CM

## 2013-02-28 DIAGNOSIS — R0789 Other chest pain: Secondary | ICD-10-CM | POA: Diagnosis not present

## 2013-02-28 DIAGNOSIS — R1012 Left upper quadrant pain: Secondary | ICD-10-CM | POA: Diagnosis not present

## 2013-02-28 DIAGNOSIS — S301XXA Contusion of abdominal wall, initial encounter: Secondary | ICD-10-CM | POA: Diagnosis not present

## 2013-02-28 DIAGNOSIS — S3981XA Other specified injuries of abdomen, initial encounter: Secondary | ICD-10-CM | POA: Diagnosis not present

## 2013-02-28 DIAGNOSIS — S298XXA Other specified injuries of thorax, initial encounter: Secondary | ICD-10-CM | POA: Diagnosis not present

## 2013-02-28 LAB — PROTIME-INR: INR: 1.6

## 2013-02-28 LAB — URINALYSIS, COMPLETE
Ketone: NEGATIVE
Nitrite: NEGATIVE
Ph: 6 (ref 4.5–8.0)
Protein: NEGATIVE
RBC,UR: 1 /HPF (ref 0–5)
Specific Gravity: 1.006 (ref 1.003–1.030)
Transitional Epi: <1
WBC UR: 7 /HPF (ref 0–5)

## 2013-02-28 LAB — COMPREHENSIVE METABOLIC PANEL
Alkaline Phosphatase: 48 U/L
Anion Gap: 6 — ABNORMAL LOW (ref 7–16)
BUN: 17 mg/dL (ref 7–18)
Co2: 27 mmol/L (ref 21–32)
EGFR (Non-African Amer.): >60
Glucose: 91 mg/dL (ref 65–99)
SGOT(AST): 25 U/L (ref 15–37)
Total Protein: 7.1 g/dL (ref 6.4–8.2)

## 2013-02-28 LAB — CBC
HCT: 36.6 % (ref 35.0–47.0)
HGB: 12.4 g/dL (ref 12.0–16.0)
MCH: 28.1 pg (ref 26.0–34.0)
MCHC: 33.8 g/dL (ref 32.0–36.0)
MCV: 83 fL (ref 80–100)
Platelet: 123 10*3/uL — ABNORMAL LOW (ref 150–440)
RBC: 4.41 10*6/uL (ref 3.80–5.20)

## 2013-02-28 LAB — LIPASE, BLOOD: Lipase: 173 U/L (ref 73–393)
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
## 2013-02-28 NOTE — Telephone Encounter (Signed)
Thanks -please call for note when it is ready

## 2013-03-02 ENCOUNTER — Telehealth: Payer: Self-pay | Admitting: *Deleted

## 2013-03-02 NOTE — Telephone Encounter (Signed)
Left voicemail to call for holter report.

## 2013-03-07 NOTE — Telephone Encounter (Signed)
I reviewed her CT scan - it is reassuring - no bleeding or fractures They did see an incidental cyst on L  fallopian tube - we will follow that with an ultrasound, but it does not seem to be worrisome  I hope she is feeling better - please follow up with me next week for a re check and we will review the CT further

## 2013-03-07 NOTE — Telephone Encounter (Signed)
Received notes and placed in your inbox

## 2013-03-08 NOTE — Telephone Encounter (Signed)
Pt notified of CT results and Dr. Royden Purl comments, f/u appt scheduled for 03/14/13

## 2013-03-09 ENCOUNTER — Ambulatory Visit (INDEPENDENT_AMBULATORY_CARE_PROVIDER_SITE_OTHER): Payer: Medicare Other | Admitting: Family Medicine

## 2013-03-09 DIAGNOSIS — Z5181 Encounter for therapeutic drug level monitoring: Secondary | ICD-10-CM | POA: Diagnosis not present

## 2013-03-09 DIAGNOSIS — I4891 Unspecified atrial fibrillation: Secondary | ICD-10-CM

## 2013-03-09 DIAGNOSIS — Z7901 Long term (current) use of anticoagulants: Secondary | ICD-10-CM

## 2013-03-13 ENCOUNTER — Other Ambulatory Visit: Payer: Self-pay | Admitting: Family Medicine

## 2013-03-13 NOTE — Telephone Encounter (Signed)
Rout to [place] 

## 2013-03-14 ENCOUNTER — Ambulatory Visit (INDEPENDENT_AMBULATORY_CARE_PROVIDER_SITE_OTHER): Payer: Medicare Other | Admitting: Family Medicine

## 2013-03-14 ENCOUNTER — Encounter: Payer: Self-pay | Admitting: Family Medicine

## 2013-03-14 VITALS — BP 124/76 | HR 72 | Temp 97.9°F | Ht 65.5 in | Wt 171.5 lb

## 2013-03-14 DIAGNOSIS — S20219A Contusion of unspecified front wall of thorax, initial encounter: Secondary | ICD-10-CM | POA: Diagnosis not present

## 2013-03-14 DIAGNOSIS — N83209 Unspecified ovarian cyst, unspecified side: Secondary | ICD-10-CM

## 2013-03-14 DIAGNOSIS — S20212A Contusion of left front wall of thorax, initial encounter: Secondary | ICD-10-CM

## 2013-03-14 DIAGNOSIS — N83202 Unspecified ovarian cyst, left side: Secondary | ICD-10-CM | POA: Insufficient documentation

## 2013-03-14 NOTE — Assessment & Plan Note (Signed)
Suspect stable -seen on CT Check with pelvic US  Nl pelvic exam today

## 2013-03-14 NOTE — Progress Notes (Signed)
Pre-visit discussion using our clinic review tool. No additional management support is needed unless otherwise documented below in the visit note.  
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
## 2013-03-14 NOTE — Patient Instructions (Signed)
Use heat on chest injury as needed  If any new cough or fever or worse pain let me know  Stop up front for referral for pelvic ultrasound to check the ovarian cyst on the left Take care of yourself

## 2013-03-17 ENCOUNTER — Ambulatory Visit: Payer: Self-pay | Admitting: Family Medicine

## 2013-03-17 ENCOUNTER — Other Ambulatory Visit: Payer: Self-pay | Admitting: Family Medicine

## 2013-03-17 DIAGNOSIS — N83209 Unspecified ovarian cyst, unspecified side: Secondary | ICD-10-CM | POA: Diagnosis not present

## 2013-03-17 NOTE — Telephone Encounter (Signed)
Rout to [place] 

## 2013-03-21 ENCOUNTER — Telehealth: Payer: Self-pay | Admitting: *Deleted

## 2013-03-21 ENCOUNTER — Encounter: Payer: Self-pay | Admitting: Family Medicine

## 2013-03-21 MED ORDER — HYDROCODONE-ACETAMINOPHEN 5-325 MG PO TABS: 1.0000 | ORAL_TABLET | Freq: Four times a day (QID) | ORAL | Status: DC | PRN

## 2013-03-21 NOTE — Telephone Encounter (Signed)
Pt notified of [name] results (ovarian cyst)  Pt also request a refill of her hydrocodone-apap 5-325mg , sig: take 1 tab by mouth q4hr prn, that she was given in the hospital, pt is almost out of med. Pt said she is starting to feel better and only uses the Rx at night because it still hurts to lay down, please advise (Pt aware she will have to pick up Rx, we can't call Rx in to pharmacy)

## 2013-03-21 NOTE — Telephone Encounter (Signed)
Px printed for pick up in IN box Use caution- is sedating and habit forming- I'm glad she is needing less of it

## 2013-03-21 NOTE — Telephone Encounter (Signed)
Pt notified Rx ready for pickup 

## 2013-04-20 ENCOUNTER — Telehealth: Payer: Self-pay | Admitting: Family Medicine

## 2013-04-20 ENCOUNTER — Ambulatory Visit (INDEPENDENT_AMBULATORY_CARE_PROVIDER_SITE_OTHER): Payer: Medicare Other | Admitting: Family Medicine

## 2013-04-20 ENCOUNTER — Other Ambulatory Visit: Payer: Self-pay | Admitting: Family Medicine

## 2013-04-20 DIAGNOSIS — I4891 Unspecified atrial fibrillation: Secondary | ICD-10-CM | POA: Diagnosis not present

## 2013-04-20 DIAGNOSIS — Z7901 Long term (current) use of anticoagulants: Secondary | ICD-10-CM

## 2013-04-20 DIAGNOSIS — Z5181 Encounter for therapeutic drug level monitoring: Secondary | ICD-10-CM | POA: Insufficient documentation

## 2013-04-20 LAB — POCT INR: INR: 3.1

## 2013-04-20 MED ORDER — WARFARIN SODIUM 1 MG PO TABS: 1.0000 mg | ORAL_TABLET | ORAL | Status: DC

## 2013-04-20 NOTE — Telephone Encounter (Signed)
There was a little diverticulosis - that can cause bleeding at times / but hemorrhoids are more commonly the cause  If there is a lot of blood (more than just streaks on the stool) or abdominal pain - please follow up

## 2013-04-20 NOTE — Telephone Encounter (Signed)
That sounds good

## 2013-04-20 NOTE — Telephone Encounter (Signed)
Pt notified of CT results and Dr. Marliss Coots comments. Pt said she isn't bleeding a lot and she doesn't have any stomach pain. Pt said this really started when she changed her medications, and the med changed caused some constipation. Pt said since then she sees a little blood now and then but she feels okay for now and thinks it is probably coming from her hemorrhoids. I did advise pt is sxs worsen or no improvement with the rectal bleeding she needs to schedule a f/u appt., or if she starts having abd pain, pt verbalized understanding

## 2013-04-20 NOTE — Telephone Encounter (Signed)
Pt wants to know if CT that was done a few months ago would show any issues in her digestive tract.  She says she has noticed blood in her stool every now and then.  She said it could be hemorrhoids but she just wanted to see if there was anything that could be seen from the scan.

## 2013-06-01 ENCOUNTER — Ambulatory Visit: Payer: Medicare Other

## 2013-06-26 ENCOUNTER — Emergency Department: Payer: Self-pay | Admitting: Emergency Medicine

## 2013-06-26 DIAGNOSIS — R072 Precordial pain: Secondary | ICD-10-CM | POA: Diagnosis not present

## 2013-06-26 DIAGNOSIS — J189 Pneumonia, unspecified organism: Secondary | ICD-10-CM | POA: Diagnosis not present

## 2013-06-26 DIAGNOSIS — R52 Pain, unspecified: Secondary | ICD-10-CM | POA: Diagnosis not present

## 2013-06-26 DIAGNOSIS — R079 Chest pain, unspecified: Secondary | ICD-10-CM | POA: Diagnosis not present

## 2013-06-26 DIAGNOSIS — Z880 Allergy status to penicillin: Secondary | ICD-10-CM | POA: Diagnosis not present

## 2013-06-26 DIAGNOSIS — Z79899 Other long term (current) drug therapy: Secondary | ICD-10-CM | POA: Diagnosis not present

## 2013-06-26 DIAGNOSIS — Z7901 Long term (current) use of anticoagulants: Secondary | ICD-10-CM | POA: Diagnosis not present

## 2013-06-26 DIAGNOSIS — R0789 Other chest pain: Secondary | ICD-10-CM | POA: Diagnosis not present

## 2013-06-26 LAB — BASIC METABOLIC PANEL
ANION GAP: 6 — AB (ref 7–16)
BUN: 14 mg/dL (ref 7–18)
CALCIUM: 8.3 mg/dL — AB (ref 8.5–10.1)
Chloride: 106 mmol/L (ref 98–107)
Co2: 27 mmol/L (ref 21–32)
Creatinine: 0.83 mg/dL (ref 0.60–1.30)
EGFR (Non-African Amer.): >60
Glucose: 136 mg/dL — ABNORMAL HIGH (ref 65–99)
Osmolality: 280 (ref 275–301)
Potassium: 3.5 mmol/L (ref 3.5–5.1)
SODIUM: 139 mmol/L (ref 136–145)

## 2013-06-26 LAB — CBC
HCT: 41.4 % (ref 35.0–47.0)
HGB: 13.5 g/dL (ref 12.0–16.0)
MCH: 28.1 pg (ref 26.0–34.0)
MCHC: 32.7 g/dL (ref 32.0–36.0)
MCV: 86 fL (ref 80–100)
Platelet: 137 10*3/uL — ABNORMAL LOW (ref 150–440)
RBC: 4.82 10*6/uL (ref 3.80–5.20)
RDW: 14.6 % — ABNORMAL HIGH (ref 11.5–14.5)
WBC: 3.5 10*3/uL — AB (ref 3.6–11.0)

## 2013-06-26 LAB — TROPONIN I: Troponin-I: <0.02 ng/mL

## 2013-06-26 LAB — PROTIME-INR
INR: 1.7
Prothrombin Time: 19.3 secs — ABNORMAL HIGH (ref 11.5–14.7)

## 2013-06-26 LAB — DIGOXIN LEVEL: DIGOXIN: 0.35 ng/mL

## 2013-06-27 ENCOUNTER — Telehealth: Payer: Self-pay | Admitting: Cardiovascular Disease

## 2013-06-27 ENCOUNTER — Telehealth: Payer: Self-pay | Admitting: *Deleted

## 2013-06-27 LAB — TROPONIN I: Troponin-I: <0.02 ng/mL

## 2013-06-27 NOTE — Telephone Encounter (Signed)
New message         Pt cut back on digoxin and her coumadin is low since last night. Pt would like to know if she is suppose to continue taking the lower dose of digoxin?

## 2013-06-27 NOTE — Telephone Encounter (Signed)
I spoke with the pt and she went to [facility] last night due to pain in her chest and arms.  The pt thought this was heart related but was found to have pneumonia.  The pt was started on an antibiotic and cough medicine.  The pt said the ER made her aware that her INR was 1.7.  I advised the pt to contact her PCP for a recheck of INR as her medications can effect this level and her coumadin may need to be adjusted. The pt also said that her digoxin was low. I advised the pt that she should continue her current dosage of digoxin.  The pt agreed with plan.

## 2013-06-27 NOTE — Telephone Encounter (Signed)
Called Benjie Karvonen at [facility] and she states she will call pt regarding her recent INR and get pt in for an office visit as she does follow her coumadin and pt has not been in for an appt since January

## 2013-06-27 NOTE — Telephone Encounter (Signed)
Spoke with pt.  Advised her to take an extra 1/2 tablet Coumadin today (totalling 7.5mg ) since INR was 1.7 last night and recheck this week.  She is unable to come in this week, appt scheduled for Monday 4/13.

## 2013-06-29 ENCOUNTER — Ambulatory Visit (INDEPENDENT_AMBULATORY_CARE_PROVIDER_SITE_OTHER): Payer: Medicare Other | Admitting: Family Medicine

## 2013-06-29 DIAGNOSIS — I4891 Unspecified atrial fibrillation: Secondary | ICD-10-CM

## 2013-06-29 DIAGNOSIS — Z7901 Long term (current) use of anticoagulants: Secondary | ICD-10-CM

## 2013-06-29 DIAGNOSIS — Z5181 Encounter for therapeutic drug level monitoring: Secondary | ICD-10-CM | POA: Diagnosis not present

## 2013-06-29 LAB — POCT INR: INR: 2.6

## 2013-07-02 LAB — CULTURE, BLOOD (SINGLE)

## 2013-07-03 ENCOUNTER — Ambulatory Visit: Payer: Medicare Other

## 2013-07-29 ENCOUNTER — Other Ambulatory Visit: Payer: Self-pay | Admitting: Internal Medicine

## 2013-08-10 ENCOUNTER — Ambulatory Visit (INDEPENDENT_AMBULATORY_CARE_PROVIDER_SITE_OTHER): Payer: Medicare Other | Admitting: Family Medicine

## 2013-08-10 DIAGNOSIS — Z5181 Encounter for therapeutic drug level monitoring: Secondary | ICD-10-CM

## 2013-08-10 DIAGNOSIS — I4891 Unspecified atrial fibrillation: Secondary | ICD-10-CM | POA: Diagnosis not present

## 2013-08-10 LAB — POCT INR: INR: 3.3

## 2013-08-16 ENCOUNTER — Encounter: Payer: Self-pay | Admitting: Family Medicine

## 2013-08-16 ENCOUNTER — Ambulatory Visit (INDEPENDENT_AMBULATORY_CARE_PROVIDER_SITE_OTHER): Payer: Medicare Other | Admitting: Family Medicine

## 2013-08-16 VITALS — BP 112/70 | HR 83 | Temp 98.0°F | Ht 65.5 in | Wt 171.5 lb

## 2013-08-16 DIAGNOSIS — R5381 Other malaise: Secondary | ICD-10-CM | POA: Diagnosis not present

## 2013-08-16 DIAGNOSIS — Z6379 Other stressful life events affecting family and household: Secondary | ICD-10-CM | POA: Diagnosis not present

## 2013-08-16 DIAGNOSIS — Z1211 Encounter for screening for malignant neoplasm of colon: Secondary | ICD-10-CM | POA: Insufficient documentation

## 2013-08-16 DIAGNOSIS — R5383 Other fatigue: Secondary | ICD-10-CM | POA: Diagnosis not present

## 2013-08-16 DIAGNOSIS — Z636 Dependent relative needing care at home: Secondary | ICD-10-CM | POA: Insufficient documentation
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
## 2013-08-16 NOTE — Patient Instructions (Signed)
Stop the zyrtec - to see if this is making you sleepy  Labs today  Please do the IFOB stool card for colon cancer screening Let me know if you want to talk to a psychological counselor - to help deal with stress  Keep exercising and taking care of yourself

## 2013-08-17 ENCOUNTER — Other Ambulatory Visit (INDEPENDENT_AMBULATORY_CARE_PROVIDER_SITE_OTHER): Payer: Medicare Other

## 2013-08-17 DIAGNOSIS — Z1211 Encounter for screening for malignant neoplasm of colon: Secondary | ICD-10-CM

## 2013-08-17 LAB — CBC WITH DIFFERENTIAL/PLATELET
Basophils Absolute: 0 10*3/uL (ref 0.0–0.1)
Basophils Relative: 0.4 % (ref 0.0–3.0)
EOS ABS: 0.1 10*3/uL (ref 0.0–0.7)
Eosinophils Relative: 2.8 % (ref 0.0–5.0)
HCT: 38.7 % (ref 36.0–46.0)
Hemoglobin: 12.9 g/dL (ref 12.0–15.0)
LYMPHS PCT: 33.4 % (ref 12.0–46.0)
Lymphs Abs: 1.5 10*3/uL (ref 0.7–4.0)
MCHC: 33.5 g/dL (ref 30.0–36.0)
MCV: 86.4 fl (ref 78.0–100.0)
MONOS PCT: 8.1 % (ref 3.0–12.0)
Monocytes Absolute: 0.4 10*3/uL (ref 0.1–1.0)
Neutro Abs: 2.5 10*3/uL (ref 1.4–7.7)
Neutrophils Relative %: 55.3 % (ref 43.0–77.0)
Platelets: 154 10*3/uL (ref 150.0–400.0)
RBC: 4.48 Mil/uL (ref 3.87–5.11)
RDW: 14.9 % (ref 11.5–15.5)
WBC: 4.5 10*3/uL (ref 4.0–10.5)

## 2013-08-17 LAB — COMPREHENSIVE METABOLIC PANEL
ALT: 14 U/L (ref 0–35)
AST: 18 U/L (ref 0–37)
Albumin: 4.2 g/dL (ref 3.5–5.2)
Alkaline Phosphatase: 41 U/L (ref 39–117)
BUN: 16 mg/dL (ref 6–23)
CALCIUM: 9.3 mg/dL (ref 8.4–10.5)
CHLORIDE: 105 meq/L (ref 96–112)
CO2: 29 mEq/L (ref 19–32)
Creatinine, Ser: 0.8 mg/dL (ref 0.4–1.2)
GFR: 73.58 mL/min (ref >60.00)
Glucose, Bld: 78 mg/dL (ref 70–99)
Potassium: 3.9 mEq/L (ref 3.5–5.1)
Sodium: 141 mEq/L (ref 135–145)
TOTAL PROTEIN: 7 g/dL (ref 6.0–8.3)
Total Bilirubin: 0.5 mg/dL (ref 0.2–1.2)

## 2013-08-17 LAB — VITAMIN B12: VITAMIN B 12: 527 pg/mL (ref 211–911)

## 2013-08-17 LAB — FECAL OCCULT BLOOD, IMMUNOCHEMICAL: FECAL OCCULT BLD: NEGATIVE

## 2013-08-17 LAB — DIGOXIN LEVEL: DIGOXIN LVL: 0.4 ng/mL — AB (ref 0.8–2.0)

## 2013-08-17 LAB — TSH: TSH: 4.64 u[IU]/mL — ABNORMAL HIGH (ref 0.35–4.50)

## 2013-08-17 LAB — FECAL OCCULT BLOOD, GUAIAC: FECAL OCCULT BLD: NEGATIVE
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
## 2013-08-17 NOTE — Assessment & Plan Note (Signed)
Ifob card today   Pt does have occ blood in stool- likely hemorrhoids

## 2013-08-17 NOTE — Assessment & Plan Note (Signed)
Suspect multifactorial- with meds/ health issues/ schedule/stressors  Lab today  Disc consideration of psych counseling in the future as well  Reassuring exam

## 2013-08-18 ENCOUNTER — Telehealth: Payer: Self-pay | Admitting: Family Medicine

## 2013-08-18 ENCOUNTER — Encounter: Payer: Self-pay | Admitting: Family Medicine

## 2013-08-18 MED ORDER — LEVOTHYROXINE SODIUM 25 MCG PO TABS: 25.0000 ug | ORAL_TABLET | Freq: Every day | ORAL | Status: DC

## 2013-08-18 NOTE — Telephone Encounter (Signed)
I am sending that in

## 2013-08-18 NOTE — Telephone Encounter (Signed)
Message copied by Abner Greenspan on Fri Aug 18, 2013  5:01 PM ------      Message from: Tammi Sou      Created: Fri Aug 18, 2013  4:43 PM       Pt notified of labs and Dr. Marliss Coots comments       Pt would like to try levothyroxine, pt uses [facility]. For pharmacy, pt said she will just check pharmacy tomorrow to see when Rx will be ready for pick-up            Lab appt scheduled to check TSH and free T4 in a month ------

## 2013-08-23 ENCOUNTER — Ambulatory Visit (INDEPENDENT_AMBULATORY_CARE_PROVIDER_SITE_OTHER): Payer: Medicare Other | Admitting: Internal Medicine

## 2013-08-23 ENCOUNTER — Encounter: Payer: Self-pay | Admitting: Internal Medicine

## 2013-08-23 VITALS — BP 120/70 | HR 100 | Ht 65.0 in | Wt 171.1 lb

## 2013-08-23 DIAGNOSIS — I4891 Unspecified atrial fibrillation: Secondary | ICD-10-CM

## 2013-08-23 NOTE — Progress Notes (Signed)
Patient Care Team: Abner Greenspan, MD as PCP - General   HPI  [name] is a 80 y.o. female seen following an atrial flutter ablation related to prior atriotomy undertaken at [facility] in March 2010. The postprocedural interval was complicated by recurrent atrial arrhythmias prompting initiation of Tikosyn following cardioversion for atrial tachycardia. This was stopped fall 2012 Recurrent symptoms prompted [name] to reinitiate Tikosyn. sHe's had recurrent episodes of atrial fibrillation associated with a rapid ventricular response.   She underwent cardioversion and reverted again to atrial fibrillation; it is now permanent  She has decided to remain on warfarin  Her ejection fraction initially was about 35-40% and repeat assessment in a just a couple of weeks later demonstrated 55-60% with improved rate control following the initiation of digoxin.   She is feeling considerably better with less fatigue and less exercise intolerance.          Past Medical History  Diagnosis Date  . TIA (transient ischemic attack)     facial numbness 2010  . Cardiomyopathy     rate-related-resolved  . Atrial fibrillation -persistent     a. s/p PVI [name] 2010;  b. on tikosyn/coumadin;  c. 05/2009 Echo: EF 60-65%, Gr 2 DD.  [name] History of mitral valve repair   . FH: colonic polyps   . Hypothyroidism   . Carotid stenosis     mild (hosp 3/11)- consult by vasc/ Dr Donnetta Hutching  . Pulmonary nodule   . Depression   . Asthma   . Allergy     allergic rhinitis  . Hyperlipidemia   . Syncope and collapse   . 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 Mitral valve replacement      Ring around valve mitral valve  . Pilonidal cyst removal    . Colonoscopy  2001  . Exercise stress test  2/07  . Ct of abd and pelvis  10/07    negative  . Carotid doppler  10/07    no stenosis  . Gross hematuria on anticoagulants  10/07    neg urol work up   . Admit- syncope,  bradycardia      cardiac and neuro work up  . Colonoscopy- polyps  8/08  . Tia  3/11    nl imaging and ech, with mod carotid stenosis (had vasc consul  . Appendectomy      Current Outpatient Prescriptions  Medication Sig Dispense Refill  . digoxin (LANOXIN) 0.0625 mg TABS tablet Take 0.5 tablets (0.0625 mg total) by mouth daily.  15 tablet  11  . diltiazem (CARDIZEM CD) 180 MG 24 hr capsule TAKE 1 CAPSULE (180 MG TOTAL) BY MOUTH DAILY.  30 capsule  6  . levothyroxine (SYNTHROID, LEVOTHROID) 25 MCG tablet Take 1 tablet (25 mcg total) by mouth daily before breakfast.  30 tablet  3  . NON FORMULARY Take 1 tablet by mouth daily. Herbal supplement with herbs, chromium, and tea extracts. Taken for appetite suppressant.      . warfarin (COUMADIN) 1 MG tablet Take 1 tablet (1 mg total) by mouth as directed.  90 tablet  0  . warfarin (COUMADIN) 4 MG tablet TAKE ONE TABLET BY MOUTH AS DIRECTED  90 tablet  0  . warfarin (COUMADIN) 5 MG tablet TAKE AS DIRECTED BY COUMADIN CLINIC  90 tablet  0   No current facility-administered medications for this visit.    Allergies  Allergen  Reactions  . Amiodarone Hcl     REACTION: Intolerance  . Penicillins     REACTION: rash  . Statins     REACTION: rash    Review of Systems negative except from HPI and PMH  Physical Exam BP 120/70  Pulse 100  Ht 5\' 5"  (1.651 m)  Wt 171 lb 1.9 oz (77.62 kg)  BMI 28.48 kg/m2 Well developed and well nourished in no acute distress HENT normal E scleral and icterus clear Neck Supple JVP flat; carotids brisk and full Clear to ausculation Irregular rate and rhythm, 2/6 systolic mSoft with active bowel sounds No clubbing cyanosis Trace Edema Alert and oriented, grossly normal motor and sensory function Skin Warm and Dry  ECG  Afib 100  Assessment and  Plan  Atrial Fibrillation permanent  HTN  Cardiomyopathy--resolved  HFpEF  Overall she is doing quite well. She is back at work. This is decreased  distress of issues with her mother. Exercise tolerance is reasonable. We'll check an echo at her next visit We will plan to add an ACE inhibitor

## 2013-08-23 NOTE — Patient Instructions (Signed)
Your physician wants you to follow-up in: 6 months with Dr. Klein. You will receive a reminder letter in the mail two months in advance. If you don't receive a letter, please call our office to schedule the follow-up appointment.  

## 2013-09-05 ENCOUNTER — Other Ambulatory Visit: Payer: Self-pay | Admitting: Family Medicine

## 2013-09-05 DIAGNOSIS — E039 Hypothyroidism, unspecified: Secondary | ICD-10-CM

## 2013-09-14 ENCOUNTER — Other Ambulatory Visit (INDEPENDENT_AMBULATORY_CARE_PROVIDER_SITE_OTHER): Payer: Medicare Other

## 2013-09-14 DIAGNOSIS — E039 Hypothyroidism, unspecified: Secondary | ICD-10-CM | POA: Diagnosis not present

## 2013-09-14 LAB — T4, FREE: FREE T4: 0.9 ng/dL (ref 0.60–1.60)

## 2013-09-14 LAB — TSH: TSH: 3.01 u[IU]/mL (ref 0.35–4.50)

## 2013-09-15 ENCOUNTER — Other Ambulatory Visit: Payer: Medicare Other

## 2013-09-15 ENCOUNTER — Encounter: Payer: Self-pay | Admitting: *Deleted

## 2013-09-20 ENCOUNTER — Ambulatory Visit: Payer: Medicare Other

## 2013-09-21 DIAGNOSIS — D485 Neoplasm of uncertain behavior of skin: Secondary | ICD-10-CM | POA: Diagnosis not present

## 2013-09-25 ENCOUNTER — Other Ambulatory Visit: Payer: Self-pay | Admitting: Family Medicine

## 2013-09-25 NOTE — Telephone Encounter (Signed)
Rout to [place] 

## 2013-09-30 ENCOUNTER — Other Ambulatory Visit: Payer: Self-pay | Admitting: Family Medicine

## 2013-10-02 NOTE — Telephone Encounter (Signed)
Rout to [place] 

## 2013-11-09 ENCOUNTER — Ambulatory Visit (INDEPENDENT_AMBULATORY_CARE_PROVIDER_SITE_OTHER): Payer: Medicare Other | Admitting: *Deleted

## 2013-11-09 DIAGNOSIS — Z5181 Encounter for therapeutic drug level monitoring: Secondary | ICD-10-CM | POA: Diagnosis not present

## 2013-11-09 LAB — POCT INR: INR: 3

## 2013-12-04 ENCOUNTER — Telehealth: Payer: Self-pay | Admitting: Internal Medicine

## 2013-12-04 NOTE — Telephone Encounter (Signed)
New message     Pt is now taking biotin----can she take this along with her other medications?  OK to leave a message

## 2013-12-04 NOTE — Telephone Encounter (Signed)
Left message advising ok to take biotin

## 2013-12-12 ENCOUNTER — Other Ambulatory Visit: Payer: Self-pay | Admitting: Family Medicine

## 2013-12-21 ENCOUNTER — Ambulatory Visit: Payer: Medicare Other

## 2013-12-25 ENCOUNTER — Ambulatory Visit: Payer: Medicare Other

## 2013-12-30 ENCOUNTER — Other Ambulatory Visit: Payer: Self-pay | Admitting: Internal Medicine

## 2014-01-01 ENCOUNTER — Ambulatory Visit (INDEPENDENT_AMBULATORY_CARE_PROVIDER_SITE_OTHER): Payer: Medicare Other | Admitting: *Deleted

## 2014-01-01 DIAGNOSIS — Z5181 Encounter for therapeutic drug level monitoring: Secondary | ICD-10-CM | POA: Diagnosis not present

## 2014-01-01 LAB — POCT INR: INR: 2.3

## 2014-01-24 ENCOUNTER — Encounter: Payer: Self-pay | Admitting: Family Medicine

## 2014-01-24 ENCOUNTER — Ambulatory Visit (INDEPENDENT_AMBULATORY_CARE_PROVIDER_SITE_OTHER): Payer: Medicare Other | Admitting: Family Medicine

## 2014-01-24 VITALS — BP 110/70 | HR 90 | Temp 99.3°F | Ht 65.0 in | Wt 171.0 lb

## 2014-01-24 DIAGNOSIS — J069 Acute upper respiratory infection, unspecified: Secondary | ICD-10-CM | POA: Diagnosis not present

## 2014-01-24 DIAGNOSIS — B9789 Other viral agents as the cause of diseases classified elsewhere: Principal | ICD-10-CM

## 2014-01-24 MED ORDER — AZITHROMYCIN 250 MG PO TABS: ORAL_TABLET | ORAL | Status: DC

## 2014-01-24 MED ORDER — DOXYCYCLINE HYCLATE 100 MG PO TABS: 100.0000 mg | ORAL_TABLET | Freq: Two times a day (BID) | ORAL | Status: DC

## 2014-01-24 MED ORDER — GUAIFENESIN-CODEINE 100-10 MG/5ML PO SYRP: 5.0000 mL | ORAL_SOLUTION | Freq: Four times a day (QID) | ORAL | Status: DC | PRN

## 2014-01-24 NOTE — Assessment & Plan Note (Signed)
Fluids and rest Robitussin AC with caution as needed for cough  Disc symptomatic care - see instructions on AVS  If worse will fill px for doxycycline (aware this will affect protime) and call - also get INR checked Update if not starting to improve in a week or if worsening   Reassuring exam

## 2014-01-24 NOTE — Progress Notes (Signed)
Pre visit review using our clinic review tool, if applicable. No additional management support is needed unless otherwise documented below in the visit note. 

## 2014-01-24 NOTE — Patient Instructions (Addendum)
I think you have viral upper respiratory infection  Drink fluids and rest  Take the cough medicine with caution-it can sedate  If symptoms worsen - fill px for doxycycline and take it as directed (also get protime checked soon after starting) - as it may affect your coumadin  Update if not starting to improve in a week or if worsening
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
## 2014-01-26 ENCOUNTER — Telehealth: Payer: Self-pay | Admitting: Family Medicine

## 2014-01-26 MED ORDER — ALBUTEROL SULFATE HFA 108 (90 BASE) MCG/ACT IN AERS: 2.0000 | INHALATION_SPRAY | RESPIRATORY_TRACT | Status: DC | PRN

## 2014-01-26 NOTE — Telephone Encounter (Signed)
Inhaler sent to pharmacy and pt notified of Dr. Marliss Coots comments/recommendation and to f/u if no improvement, pt verbalized understanding

## 2014-01-26 NOTE — Telephone Encounter (Signed)
The robitussin ac has some expectorant in it but probably not enough  I want her to add mucinex over the counter twice daily as directed along with what she is taking  Drink lots and lots of water as well - need that to break up the mucous as well  Please call in albuterol mdi to pharmacy as well  If no imp tomorrow can call for appt with [facility]

## 2014-01-26 NOTE — Telephone Encounter (Signed)
Pt called to update you on her condition. She states she is having trouble coughing up the mucus. It is getting stuck in her throat. She is coughing so much it hurts. She cannot lay down because she is coughing so much and states she was up all night. The cough medicine prescribed is not helping. She would like something to help her break up the mucus. Also maybe an inhaler. Please advise.  [facility]  [phone]

## 2014-02-03 ENCOUNTER — Other Ambulatory Visit: Payer: Self-pay | Admitting: Internal Medicine

## 2014-02-03 ENCOUNTER — Other Ambulatory Visit: Payer: Self-pay | Admitting: Family Medicine

## 2014-02-05 NOTE — Telephone Encounter (Signed)
done

## 2014-02-05 NOTE — Telephone Encounter (Signed)
Please refill for 3 mo  

## 2014-02-05 NOTE — Telephone Encounter (Signed)
Electronic refill request, [name] out of office, please advise

## 2014-02-06 ENCOUNTER — Telehealth: Payer: Self-pay | Admitting: Internal Medicine

## 2014-02-06 NOTE — Telephone Encounter (Signed)
Pt's dtr calling re a lot of concerns she has re her mom, pls call dtr roxanne [phone]

## 2014-02-06 NOTE — Telephone Encounter (Signed)
Called patient and verified ok to speak with her dtr Roxanne about anything concerning her.
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
## 2014-02-09 NOTE — Telephone Encounter (Signed)
Informed patient's dtr that Dr. Caryl Comes will be glad to discuss concern at office visit 12/1. She verbalized understanding and will come with mom that day.

## 2014-02-12 ENCOUNTER — Other Ambulatory Visit (INDEPENDENT_AMBULATORY_CARE_PROVIDER_SITE_OTHER): Payer: Medicare Other

## 2014-02-12 ENCOUNTER — Telehealth: Payer: Self-pay | Admitting: Family Medicine

## 2014-02-12 DIAGNOSIS — Z5181 Encounter for therapeutic drug level monitoring: Secondary | ICD-10-CM | POA: Diagnosis not present

## 2014-02-12 LAB — PROTIME-INR
INR: 3.6 ratio — ABNORMAL HIGH (ref 0.8–1.0)
Prothrombin Time: 38.7 s — ABNORMAL HIGH (ref 9.6–13.1)

## 2014-02-12 MED ORDER — DOXYCYCLINE HYCLATE 100 MG PO TABS: 100.0000 mg | ORAL_TABLET | Freq: Two times a day (BID) | ORAL | Status: DC

## 2014-02-12 NOTE — Telephone Encounter (Signed)
Pt stated she took all her antibiotic that dr tower gave.  She said her throat and ear are hurting now  Can dr tower call her something in for this [facility]

## 2014-02-12 NOTE — Telephone Encounter (Signed)
Let's extend her doxycycline bid for another 5 days-please call that in , thanks Please update me if no further improvement or if fever or other symptoms

## 2014-02-12 NOTE — Telephone Encounter (Signed)
Left voicemail letting pt know Rx sent and to update [name] if no improvement or any new sxs

## 2014-02-13 ENCOUNTER — Encounter: Payer: Self-pay | Admitting: Family Medicine

## 2014-02-13 ENCOUNTER — Ambulatory Visit (INDEPENDENT_AMBULATORY_CARE_PROVIDER_SITE_OTHER)
Admission: RE | Admit: 2014-02-13 | Discharge: 2014-02-13 | Disposition: A | Discharge status: Home / Self Care | Payer: Medicare Other | Source: Ambulatory Visit | Attending: Family Medicine | Admitting: Family Medicine

## 2014-02-13 ENCOUNTER — Ambulatory Visit (INDEPENDENT_AMBULATORY_CARE_PROVIDER_SITE_OTHER): Payer: Medicare Other | Admitting: Family Medicine

## 2014-02-13 VITALS — BP 122/70 | HR 84 | Temp 98.5°F | Ht 65.0 in | Wt 168.8 lb

## 2014-02-13 DIAGNOSIS — J069 Acute upper respiratory infection, unspecified: Secondary | ICD-10-CM

## 2014-02-13 DIAGNOSIS — B9789 Other viral agents as the cause of diseases classified elsewhere: Principal | ICD-10-CM

## 2014-02-13 DIAGNOSIS — J029 Acute pharyngitis, unspecified: Secondary | ICD-10-CM | POA: Diagnosis not present

## 2014-02-13 DIAGNOSIS — R05 Cough: Secondary | ICD-10-CM | POA: Diagnosis not present

## 2014-02-13 DIAGNOSIS — J449 Chronic obstructive pulmonary disease, unspecified: Secondary | ICD-10-CM | POA: Diagnosis not present

## 2014-02-13 LAB — POCT RAPID STREP A (OFFICE): Rapid Strep A Screen: NEGATIVE

## 2014-02-13 MED ORDER — NEOMYCIN-POLYMYXIN-HC 3.5-10000-1 OP SUSP: 3.0000 [drp] | Freq: Three times a day (TID) | OPHTHALMIC | Status: DC

## 2014-02-13 MED ORDER — DOXYCYCLINE HYCLATE 100 MG PO TABS: 100.0000 mg | ORAL_TABLET | Freq: Two times a day (BID) | ORAL | Status: DC

## 2014-02-13 NOTE — Patient Instructions (Signed)
Chest xray today -we will get in touch about reading later today  We sent in more doxycycline  Hold the px for eyedrops and if eye gets worse -fill it and use it  Drink lots of fluids and try to rest Update if not starting to improve in a week or if worsening

## 2014-02-13 NOTE — Assessment & Plan Note (Signed)
Did improve briefly and then worsened again  RST neg Conjunctivitis in R eye - suspect viral but given px for corticosporin drops to use if worse -disc hygiene and care of that  Reassuring exam cxr today and update  Continue doxycycline    Will update pneumovax when better

## 2014-02-13 NOTE — Progress Notes (Signed)
Pre visit review using our clinic review tool, if applicable. No additional management support is needed unless otherwise documented below in the visit note. 

## 2014-02-13 NOTE — Progress Notes (Signed)
Subjective:    Patient ID: [name], female    DOB: 28-Dec-1932, 80 y.o.   MRN: [id number]  HPI Here for f/u of uri symptoms   Initially improved  Then worse again  Congestion - a little improved Bad ST- with neg RST today  Using vics Also losenges  Ears are prickly -hot  Felt a bit feverish yesterday   No sinus pain   Finished the doxycycline - called in 5 more days   Cough a lot of mucous up - is improved / less but did turn green again  R eye is red and itchy   Patient Active Problem List   Diagnosis Date Noted  . Viral URI with cough 01/24/2014  . Caregiver stress 08/16/2013  . Colon cancer screening 08/16/2013  . Fatigue 08/16/2013  . Encounter for therapeutic drug monitoring 04/20/2013  . Left ovarian cyst 03/14/2013  . Chest wall contusion 03/14/2013  . (HFpEF) heart failure with preserved ejection fraction 12/27/2012  . Cardiomyopathy, secondary --Resolved again 10/14 10/13/2010  . COLONIC POLYPS, ADENOMATOUS, HX OF 09/18/2009  . ABDOMINAL PAIN 08/27/2009  . PULMONARY NODULE 12/20/2008  . GANGLION CYST 10/04/2007  . OBESITY 06/09/2007  . HYPERLIPIDEMIA 04/27/2007  . DEPRESSION 04/27/2007  . ASTHMA 04/27/2007  . INSOMNIA 04/27/2007  . ADENOMATOUS COLONIC POLYP 11/04/2006  . HYPOTHYROIDISM NOS 09/02/2006  . ATRIAL FIBRILLATION 08/05/2006   Past Medical History  Diagnosis Date  . TIA (transient ischemic attack)     facial numbness 2010  . Cardiomyopathy     rate-related-resolved  . Atrial fibrillation -persistent     a. s/p PVI [name] 2010;  b. on tikosyn/coumadin;  c. 05/2009 Echo: EF 60-65%, Gr 2 DD.  [name] History of mitral valve repair   . FH: colonic polyps   . Hypothyroidism   . Carotid stenosis     mild (hosp 3/11)- consult by vasc/ Dr Donnetta Hutching  . Pulmonary nodule   . Depression   . Asthma   . Allergy     allergic rhinitis  . Hyperlipidemia   . Syncope and collapse   . 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 Mitral valve replacement      Ring around valve mitral valve  . Pilonidal cyst removal    . Colonoscopy  2001  . Exercise stress test  2/07  . Ct of abd and pelvis  10/07    negative  . Carotid doppler  10/07    no stenosis  . Gross hematuria on anticoagulants  10/07    neg urol work up   . Admit- syncope, bradycardia      cardiac and neuro work up  . Colonoscopy- polyps  8/08  . Tia  3/11    nl imaging and ech, with mod carotid stenosis (had vasc consul  . Appendectomy     History  Substance Use Topics  . Smoking status: Never Smoker   . Smokeless tobacco: Never Used  . Alcohol Use: 0.0 oz/week    0 Not specified per week     Comment: occasional glass of winre   Family History  Problem Relation Age of Onset  . Hypertension Mother   . Lung cancer Father     smoker  . Alcohol abuse Father   . Cancer Father     bladder and lung CA smoker   Allergies  Allergen Reactions  . Amiodarone Hcl     REACTION: Intolerance  .  Penicillins     REACTION: rash  . Statins     REACTION: rash   Current Outpatient Prescriptions on File Prior to Visit  Medication Sig Dispense Refill  . albuterol (PROVENTIL HFA;VENTOLIN HFA) 108 (90 BASE) MCG/ACT inhaler Inhale 2 puffs into the lungs every 4 (four) hours as needed for wheezing or shortness of breath. 1 Inhaler 0  . digoxin (LANOXIN) 0.0625 mg TABS tablet Take 0.5 tablets (0.0625 mg total) by mouth daily. 15 tablet 11  . diltiazem (CARDIZEM CD) 180 MG 24 hr capsule TAKE 1 CAPSULE (180 MG TOTAL) BY MOUTH DAILY. 30 capsule 6  . guaiFENesin-codeine (ROBITUSSIN AC) 100-10 MG/5ML syrup Take 5 mLs by mouth 4 (four) times daily as needed for cough. 120 mL 0  . levothyroxine (SYNTHROID, LEVOTHROID) 25 MCG tablet TAKE 1 TABLET (25 MCG TOTAL) BY MOUTH DAILY BEFORE BREAKFAST. 30 tablet 3  . NON FORMULARY Take 1 tablet by mouth daily. Herbal supplement with herbs, chromium, and tea extracts. Taken for appetite suppressant.    .  warfarin (COUMADIN) 1 MG tablet TAKE ONE TABLET BY MOUTH AS DIRECTED 90 tablet 0  . warfarin (COUMADIN) 4 MG tablet TAKE ONE TABLET BY MOUTH AS DIRECTED 90 tablet 0  . warfarin (COUMADIN) 5 MG tablet TAKE AS DIRECTED BY COUMADIN CLINIC 90 tablet 0  . warfarin (COUMADIN) 5 MG tablet TAKE AS DIRECTED BY COUMADIN CLINIC 90 tablet 0  . warfarin (COUMADIN) 5 MG tablet TAKE AS DIRECTED BY COUMADIN CLINIC 90 tablet 0   No current facility-administered medications on file prior to visit.     Review of Systems Review of Systems  Constitutional: Negative for  appetite change, and unexpected weight change. pos for fatigue and malaise  ENT pos for congestion and rhinorrhea and neg for sinus pain  Eyes: Negative for pain and visual disturbance.  Respiratory: Negative for wheeze  and shortness of breath.   Cardiovascular: Negative for cp or palpitations    Gastrointestinal: Negative for nausea, diarrhea and constipation.  Genitourinary: Negative for urgency and frequency.  Skin: Negative for pallor or rash   Neurological: Negative for weakness, light-headedness, numbness and headaches.  Hematological: Negative for adenopathy. Does not bruise/bleed easily.  Psychiatric/Behavioral: Negative for dysphoric mood. The patient is not nervous/anxious.         Objective:   Physical Exam  Constitutional: She appears well-developed and well-nourished. No distress.  HENT:  Head: Normocephalic and atraumatic.  Right Ear: External ear normal.  Left Ear: External ear normal.  Mouth/Throat: Oropharynx is clear and moist. No oropharyngeal exudate.  Nares are boggy with clear rhinorrhea Throat clear No sinus tenderness   Eyes: Conjunctivae and EOM are normal. Pupils are equal, round, and reactive to light. Right eye exhibits no discharge. Left eye exhibits no discharge.  Neck: Normal range of motion. Neck supple.  Cardiovascular: Normal rate, regular rhythm and normal heart sounds.   Pulmonary/Chest: Effort  normal and breath sounds normal. No respiratory distress. She has no wheezes. She has no rales.  Harsh bs No rhonchi or rales   Lymphadenopathy:    She has no cervical adenopathy.  Neurological: She is alert.  Skin: Skin is warm and dry. No rash noted. No erythema. No pallor.  Psychiatric: She has a normal mood and affect.          Assessment & Plan:   Problem List Items Addressed This Visit      Respiratory   Viral URI with cough    Did improve briefly and  then worsened again  RST neg Conjunctivitis in R eye - suspect viral but given px for corticosporin drops to use if worse -disc hygiene and care of that  Reassuring exam cxr today and update  Continue doxycycline    Will update pneumovax when better     Relevant Orders      DG Chest 2 View (Completed)    Other Visit Diagnoses    Sore throat    -  Primary    Relevant Orders       Rapid Strep A (Completed)

## 2014-02-20 ENCOUNTER — Ambulatory Visit: Payer: Medicare Other | Admitting: Internal Medicine

## 2014-02-20 ENCOUNTER — Encounter: Payer: Self-pay | Admitting: *Deleted

## 2014-02-21 ENCOUNTER — Other Ambulatory Visit: Payer: Self-pay | Admitting: Internal Medicine

## 2014-02-27 ENCOUNTER — Other Ambulatory Visit (INDEPENDENT_AMBULATORY_CARE_PROVIDER_SITE_OTHER): Payer: Medicare Other

## 2014-02-27 DIAGNOSIS — Z5181 Encounter for therapeutic drug level monitoring: Secondary | ICD-10-CM

## 2014-04-03 ENCOUNTER — Ambulatory Visit: Payer: Medicare Other | Admitting: Internal Medicine

## 2014-04-09 ENCOUNTER — Ambulatory Visit (INDEPENDENT_AMBULATORY_CARE_PROVIDER_SITE_OTHER): Payer: Medicare Other | Admitting: Family Medicine

## 2014-04-09 ENCOUNTER — Encounter: Payer: Self-pay | Admitting: Family Medicine

## 2014-04-09 ENCOUNTER — Other Ambulatory Visit: Payer: Self-pay | Admitting: Family Medicine

## 2014-04-09 ENCOUNTER — Ambulatory Visit (INDEPENDENT_AMBULATORY_CARE_PROVIDER_SITE_OTHER)
Admission: RE | Admit: 2014-04-09 | Discharge: 2014-04-09 | Disposition: A | Discharge status: Home / Self Care | Payer: Medicare Other | Source: Ambulatory Visit | Attending: Family Medicine | Admitting: Family Medicine

## 2014-04-09 VITALS — BP 130/72 | HR 94 | Temp 97.4°F | Ht 65.0 in | Wt 167.0 lb

## 2014-04-09 DIAGNOSIS — J209 Acute bronchitis, unspecified: Secondary | ICD-10-CM | POA: Diagnosis not present

## 2014-04-09 DIAGNOSIS — E785 Hyperlipidemia, unspecified: Secondary | ICD-10-CM

## 2014-04-09 DIAGNOSIS — K921 Melena: Secondary | ICD-10-CM | POA: Insufficient documentation

## 2014-04-09 DIAGNOSIS — R5382 Chronic fatigue, unspecified: Secondary | ICD-10-CM | POA: Diagnosis not present

## 2014-04-09 DIAGNOSIS — R05 Cough: Secondary | ICD-10-CM | POA: Diagnosis not present

## 2014-04-09 DIAGNOSIS — Z5181 Encounter for therapeutic drug level monitoring: Secondary | ICD-10-CM | POA: Diagnosis not present

## 2014-04-09 DIAGNOSIS — J9801 Acute bronchospasm: Secondary | ICD-10-CM | POA: Diagnosis not present

## 2014-04-09 LAB — POCT INR: INR: 2.4

## 2014-04-09 MED ORDER — LEVOFLOXACIN 500 MG PO TABS: 500.0000 mg | ORAL_TABLET | Freq: Every day | ORAL | Status: DC

## 2014-04-09 MED ORDER — PREDNISONE 10 MG PO TABS: ORAL_TABLET | ORAL | Status: DC

## 2014-04-09 NOTE — Telephone Encounter (Signed)
Electronic refill request, please advise  

## 2014-04-09 NOTE — Patient Instructions (Addendum)
Labs today for fatigue  Stop at check out for referral to GI Take levaquin as directed for respiratory infection Take prednisone as directed for wheezing  Continue good fluid intake  Chest xray today   We will do protime with labs today and again next week

## 2014-04-09 NOTE — Telephone Encounter (Signed)
Please refill times 3 

## 2014-04-09 NOTE — Telephone Encounter (Signed)
done

## 2014-04-09 NOTE — Progress Notes (Signed)
Subjective:    Patient ID: [name], female    DOB: 1932/10/02, 81 y.o.   MRN: [id number]  HPI Here for uri symptoms   After her uri in Nov she started having bowel problems  "locking down" - constipation  Took Renza- herbal product - like a stool softener she thinks  Drinking lots of water  When she finally got bowels moving - she had rectal bleeding - on and off -- usually happens for several days at a time- a fair amt and bright red  Texture of stool is changed - smaller "individual droppings" - a "different texture"   Started upper respiratory symptoms - just before the weekend  Exhaustion and drained feeling  Never did totally get rid of the cough from November  Was exp to a sick grandchild  Heavy congestion and mucous - some blood in it / and yellow to green in color / very thick  Hard cough  Not a lot of nasal symptoms  She started using mucinex  Then claritin -that helps the cough a bit  Using vics on her chest   Hard to rest at night - lungs are wheezing  Has used her inhaler- that does not work very well   Patient Active Problem List   Diagnosis Date Noted  . Viral URI with cough 01/24/2014  . Caregiver stress 08/16/2013  . Colon cancer screening 08/16/2013  . Fatigue 08/16/2013  . Encounter for therapeutic drug monitoring 04/20/2013  . Left ovarian cyst 03/14/2013  . Chest wall contusion 03/14/2013  . (HFpEF) heart failure with preserved ejection fraction 12/27/2012  . Cardiomyopathy, secondary --Resolved again 10/14 10/13/2010  . COLONIC POLYPS, ADENOMATOUS, HX OF 09/18/2009  . ABDOMINAL PAIN 08/27/2009  . PULMONARY NODULE 12/20/2008  . GANGLION CYST 10/04/2007  . OBESITY 06/09/2007  . HYPERLIPIDEMIA 04/27/2007  . DEPRESSION 04/27/2007  . ASTHMA 04/27/2007  . INSOMNIA 04/27/2007  . ADENOMATOUS COLONIC POLYP 11/04/2006  . HYPOTHYROIDISM NOS 09/02/2006  . ATRIAL FIBRILLATION 08/05/2006   Past Medical History  Diagnosis Date  . TIA (transient  ischemic attack)     facial numbness 2010  . Cardiomyopathy     rate-related-resolved  . Atrial fibrillation -persistent     a. s/p PVI [name] 2010;  b. on tikosyn/coumadin;  c. 05/2009 Echo: EF 60-65%, Gr 2 DD.  [name] History of mitral valve repair   . FH: colonic polyps   . Hypothyroidism   . Carotid stenosis     mild (hosp 3/11)- consult by vasc/ Dr Donnetta Hutching  . Pulmonary nodule   . Depression   . Asthma   . Allergy     allergic rhinitis  . Hyperlipidemia   . Syncope and collapse   . 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 Mitral valve replacement      Ring around valve mitral valve  . Pilonidal cyst removal    . Colonoscopy  2001  . Exercise stress test  2/07  . Ct of abd and pelvis  10/07    negative  . Carotid doppler  10/07    no stenosis  . Gross hematuria on anticoagulants  10/07    neg urol work up   . Admit- syncope, bradycardia      cardiac and neuro work up  . Colonoscopy- polyps  8/08  . Tia  3/11    nl imaging and ech, with mod carotid stenosis (had vasc consul  . Appendectomy  History  Substance Use Topics  . Smoking status: Never Smoker   . Smokeless tobacco: Never Used  . Alcohol Use: 0.0 oz/week    0 Not specified per week     Comment: occasional glass of winre   Family History  Problem Relation Age of Onset  . Hypertension Mother   . Lung cancer Father     smoker  . Alcohol abuse Father   . Cancer Father     bladder and lung CA smoker   Allergies  Allergen Reactions  . Amiodarone Hcl     REACTION: Intolerance  . Penicillins     REACTION: rash  . Statins     REACTION: rash   Current Outpatient Prescriptions on File Prior to Visit  Medication Sig Dispense Refill  . albuterol (PROVENTIL HFA;VENTOLIN HFA) 108 (90 BASE) MCG/ACT inhaler Inhale 2 puffs into the lungs every 4 (four) hours as needed for wheezing or shortness of breath. 1 Inhaler 0  . digoxin (LANOXIN) 0.0625 mg TABS tablet Take 0.5  tablets (0.0625 mg total) by mouth daily. 15 tablet 11  . diltiazem (CARDIZEM CD) 180 MG 24 hr capsule TAKE 1 CAPSULE (180 MG TOTAL) BY MOUTH DAILY. 30 capsule 3  . levothyroxine (SYNTHROID, LEVOTHROID) 25 MCG tablet TAKE 1 TABLET (25 MCG TOTAL) BY MOUTH DAILY BEFORE BREAKFAST. 30 tablet 3  . NON FORMULARY Take 1 tablet by mouth daily. Herbal supplement with herbs, chromium, and tea extracts. Taken for appetite suppressant.    . warfarin (COUMADIN) 1 MG tablet TAKE ONE TABLET BY MOUTH AS DIRECTED 90 tablet 0  . warfarin (COUMADIN) 4 MG tablet TAKE ONE TABLET BY MOUTH AS DIRECTED 90 tablet 0  . warfarin (COUMADIN) 5 MG tablet TAKE AS DIRECTED BY COUMADIN CLINIC 90 tablet 0   No current facility-administered medications on file prior to visit.    Review of Systems Review of Systems  Constitutional: Negative for fever, appetite change, and unexpected weight change.  ENT pos for post nasal drip  Eyes: Negative for pain and visual disturbance.  Respiratory: pos for cough and wheeze   Cardiovascular: Negative for cp or palpitations    Gastrointestinal: Negative for nausea, diarrhea and pos for constipation. neg for abd pain , pos for bloating  Genitourinary: Negative for urgency and frequency.  Skin: Negative for pallor or rash   Neurological: Negative for weakness, light-headedness, numbness and headaches.  Hematological: Negative for adenopathy. Does not bruise/bleed easily.  Psychiatric/Behavioral: Negative for dysphoric mood. The patient is not nervous/anxious.         Objective:   Physical Exam  Constitutional: She appears well-developed and well-nourished. No distress.  HENT:  Head: Normocephalic and atraumatic.  Right Ear: External ear normal.  Left Ear: External ear normal.  Mouth/Throat: Oropharynx is clear and moist. No oropharyngeal exudate.  Nares are boggy No sinus tenderness   Eyes: Conjunctivae and EOM are normal. Pupils are equal, round, and reactive to light. Right eye  exhibits no discharge. Left eye exhibits no discharge.  Neck: Normal range of motion. Neck supple.  Cardiovascular: Normal rate and regular rhythm.   Pulmonary/Chest: Effort normal. No respiratory distress. She has wheezes. She has no rales.  Diffuse mild exp wheeze without prolonged exp phase  No rales  Few rhonchi   Abdominal: Soft. Bowel sounds are normal. She exhibits no distension and no mass. There is no tenderness. There is no rebound and no guarding.  Lymphadenopathy:    She has no cervical adenopathy.  Neurological: She is  alert.  Skin: Skin is warm and dry. No rash noted. No erythema. No pallor.  Psychiatric: She has a normal mood and affect.          Assessment & Plan:   Problem List Items Addressed This Visit      Respiratory   Acute bronchitis with bronchospasm - Primary    Will cover with levaquin and prednisone  CXR in light of reported blood in sputum and severe symptoms  Fluids/rest/ Disc symptomatic care - see instructions on AVS  Update if not starting to improve in a week or if worsening    Aware that the abx will affect coumadin- to watch INR closely      Relevant Orders   DG Chest 2 View (Completed)     Other   Blood in stool    Likely due to anticoag status and straining/ hemorrhoids  Has had this before -but pt concerned about stool changes Check INR  Ref to GI      Relevant Orders   Ambulatory referral to Gastroenterology   Encounter for therapeutic drug monitoring   Fatigue    Ongoing-worse since uri in the late fall Lab today ? Post viral syndrome-worsened also by current illnes      Relevant Orders   CBC with Differential (Completed)   Comprehensive metabolic panel (Completed)   TSH (Completed)

## 2014-04-09 NOTE — Progress Notes (Signed)
Pre visit review using our clinic review tool, if applicable. No additional management support is needed unless otherwise documented below in the visit note. 

## 2014-04-10 LAB — CBC WITH DIFFERENTIAL/PLATELET
Basophils Absolute: 0 10*3/uL (ref 0.0–0.1)
Basophils Relative: 0.6 % (ref 0.0–3.0)
EOS PCT: 6.1 % — AB (ref 0.0–5.0)
Eosinophils Absolute: 0.2 10*3/uL (ref 0.0–0.7)
HCT: 43.4 % (ref 36.0–46.0)
Hemoglobin: 14.1 g/dL (ref 12.0–15.0)
Lymphocytes Relative: 45.7 % (ref 12.0–46.0)
Lymphs Abs: 1.8 10*3/uL (ref 0.7–4.0)
MCHC: 32.6 g/dL (ref 30.0–36.0)
MCV: 86 fl (ref 78.0–100.0)
MONOS PCT: 15.7 % — AB (ref 3.0–12.0)
Monocytes Absolute: 0.6 10*3/uL (ref 0.1–1.0)
NEUTROS ABS: 1.2 10*3/uL — AB (ref 1.4–7.7)
Neutrophils Relative %: 31.9 % — ABNORMAL LOW (ref 43.0–77.0)
Platelets: 172 10*3/uL (ref 150.0–400.0)
RBC: 5.05 Mil/uL (ref 3.87–5.11)
RDW: 14.7 % (ref 11.5–15.5)
WBC: 3.8 10*3/uL — ABNORMAL LOW (ref 4.0–10.5)

## 2014-04-10 LAB — TSH: TSH: 4.54 u[IU]/mL — ABNORMAL HIGH (ref 0.35–4.50)

## 2014-04-10 LAB — COMPREHENSIVE METABOLIC PANEL
ALK PHOS: 53 U/L (ref 39–117)
ALT: 11 U/L (ref 0–35)
AST: 19 U/L (ref 0–37)
Albumin: 4.1 g/dL (ref 3.5–5.2)
BILIRUBIN TOTAL: 0.5 mg/dL (ref 0.2–1.2)
BUN: 15 mg/dL (ref 6–23)
CHLORIDE: 100 meq/L (ref 96–112)
CO2: 29 mEq/L (ref 19–32)
Calcium: 9.4 mg/dL (ref 8.4–10.5)
Creatinine, Ser: 0.71 mg/dL (ref 0.40–1.20)
GFR: 84.3 mL/min (ref >60.00)
Glucose, Bld: 83 mg/dL (ref 70–99)
Potassium: 4.5 mEq/L (ref 3.5–5.1)
Sodium: 139 mEq/L (ref 135–145)
Total Protein: 7.6 g/dL (ref 6.0–8.3)

## 2014-04-12 ENCOUNTER — Telehealth: Payer: Self-pay | Admitting: Family Medicine

## 2014-04-12 NOTE — Telephone Encounter (Signed)
Yes-I think that is ok  Hope she feels better soon

## 2014-04-12 NOTE — Telephone Encounter (Signed)
Pt called wanting to know if she can take musenix along with her other medications

## 2014-04-13 NOTE — Assessment & Plan Note (Signed)
Likely due to anticoag status and straining/ hemorrhoids  Has had this before -but pt concerned about stool changes Check INR  Ref to GI

## 2014-04-13 NOTE — Assessment & Plan Note (Signed)
Ongoing-worse since uri in the late fall Lab today ? Post viral syndrome-worsened also by current illnes

## 2014-04-13 NOTE — Assessment & Plan Note (Signed)
Will cover with levaquin and prednisone  CXR in light of reported blood in sputum and severe symptoms  Fluids/rest/ Disc symptomatic care - see instructions on AVS  Update if not starting to improve in a week or if worsening    Aware that the abx will affect coumadin- to watch INR closely

## 2014-04-16 ENCOUNTER — Encounter: Payer: Self-pay | Admitting: Family Medicine

## 2014-04-16 ENCOUNTER — Ambulatory Visit (INDEPENDENT_AMBULATORY_CARE_PROVIDER_SITE_OTHER): Payer: Medicare Other | Admitting: Family Medicine

## 2014-04-16 ENCOUNTER — Other Ambulatory Visit: Payer: Medicare Other

## 2014-04-16 VITALS — BP 130/84 | HR 103 | Temp 97.5°F | Ht 65.0 in | Wt 171.4 lb

## 2014-04-16 DIAGNOSIS — M791 Myalgia: Secondary | ICD-10-CM | POA: Diagnosis not present

## 2014-04-16 DIAGNOSIS — J209 Acute bronchitis, unspecified: Secondary | ICD-10-CM

## 2014-04-16 DIAGNOSIS — M7918 Myalgia, other site: Secondary | ICD-10-CM

## 2014-04-16 MED ORDER — BENZONATATE 200 MG PO CAPS: 200.0000 mg | ORAL_CAPSULE | Freq: Three times a day (TID) | ORAL | Status: DC | PRN

## 2014-04-16 NOTE — Assessment & Plan Note (Signed)
Improved exam- cough is still a problem Trial of tessalon for cough  mucinex to help mobilize mucous  Finishing prednisone/ done with levaquin Rev cxr   Update if not starting to improve in a week or if worsening   Pt is interested in specialist if no improvement

## 2014-04-16 NOTE — Telephone Encounter (Signed)
Ms. Pesantez notified by telephone that it is ok to take Mucinex along with her other medications per Dr. Glori Bickers.

## 2014-04-16 NOTE — Patient Instructions (Signed)
Try mucinex with lots of fluids to break up chest congestion  Also try tessalon for cough - to quiet it if needed  If the knot on your abdomen does not go away- have GI look at it when you go for your appt.   Update if not starting to improve in a week or if worsening

## 2014-04-16 NOTE — Progress Notes (Signed)
Pre visit review using our clinic review tool, if applicable. No additional management support is needed unless otherwise documented below in the visit note. 

## 2014-04-16 NOTE — Progress Notes (Signed)
Subjective:    Patient ID: [name], female    DOB: November 03, 1932, 81 y.o.   MRN: [id number]  HPI Here for a lump in her side she noticed when coughing and clearing her throat  ? Wonders about a hernia   Taking prednisone and levquin for bronchitis  cxr was unchanged -no infiltrate  Still very congested / a bit dizzy when she coughs a lot Still wheezing  All the congestion seems to be "upper airway" Clear mucous but thick  Does not feel like she is "recovering like she should recover" was just getting over a resp infx from Nov when she got this   She has taken claritin ? If she can take mucinex to help congestion  Trying to drink fluids   Patient Active Problem List   Diagnosis Date Noted  . Acute bronchitis with bronchospasm 04/09/2014  . Blood in stool 04/09/2014  . Viral URI with cough 01/24/2014  . Caregiver stress 08/16/2013  . Colon cancer screening 08/16/2013  . Fatigue 08/16/2013  . Encounter for therapeutic drug monitoring 04/20/2013  . Left ovarian cyst 03/14/2013  . Chest wall contusion 03/14/2013  . (HFpEF) heart failure with preserved ejection fraction 12/27/2012  . Cardiomyopathy, secondary --Resolved again 10/14 10/13/2010  . COLONIC POLYPS, ADENOMATOUS, HX OF 09/18/2009  . ABDOMINAL PAIN 08/27/2009  . PULMONARY NODULE 12/20/2008  . GANGLION CYST 10/04/2007  . OBESITY 06/09/2007  . HYPERLIPIDEMIA 04/27/2007  . DEPRESSION 04/27/2007  . ASTHMA 04/27/2007  . INSOMNIA 04/27/2007  . ADENOMATOUS COLONIC POLYP 11/04/2006  . HYPOTHYROIDISM NOS 09/02/2006  . ATRIAL FIBRILLATION 08/05/2006   Past Medical History  Diagnosis Date  . TIA (transient ischemic attack)     facial numbness 2010  . Cardiomyopathy     rate-related-resolved  . Atrial fibrillation -persistent     a. s/p PVI [name] 2010;  b. on tikosyn/coumadin;  c. 05/2009 Echo: EF 60-65%, Gr 2 DD.  [name] History of mitral valve repair   . FH: colonic polyps   . Hypothyroidism   . Carotid stenosis    mild (hosp 3/11)- consult by vasc/ Dr Donnetta Hutching  . Pulmonary nodule   . Depression   . Asthma   . Allergy     allergic rhinitis  . Hyperlipidemia   . Syncope and collapse   . 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 Mitral valve replacement      Ring around valve mitral valve  . Pilonidal cyst removal    . Colonoscopy  2001  . Exercise stress test  2/07  . Ct of abd and pelvis  10/07    negative  . Carotid doppler  10/07    no stenosis  . Gross hematuria on anticoagulants  10/07    neg urol work up   . Admit- syncope, bradycardia      cardiac and neuro work up  . Colonoscopy- polyps  8/08  . Tia  3/11    nl imaging and ech, with mod carotid stenosis (had vasc consul  . Appendectomy     History  Substance Use Topics  . Smoking status: Never Smoker   . Smokeless tobacco: Never Used  . Alcohol Use: 0.0 oz/week    0 Not specified per week     Comment: occasional glass of winre   Family History  Problem Relation Age of Onset  . Hypertension Mother   . Lung cancer Father     smoker  .  Alcohol abuse Father   . Cancer Father     bladder and lung CA smoker   Allergies  Allergen Reactions  . Amiodarone Hcl     REACTION: Intolerance  . Penicillins     REACTION: rash  . Statins     REACTION: rash   Current Outpatient Prescriptions on File Prior to Visit  Medication Sig Dispense Refill  . albuterol (PROVENTIL HFA;VENTOLIN HFA) 108 (90 BASE) MCG/ACT inhaler Inhale 2 puffs into the lungs every 4 (four) hours as needed for wheezing or shortness of breath. 1 Inhaler 0  . digoxin (LANOXIN) 0.0625 mg TABS tablet Take 0.5 tablets (0.0625 mg total) by mouth daily. 15 tablet 11  . diltiazem (CARDIZEM CD) 180 MG 24 hr capsule TAKE 1 CAPSULE (180 MG TOTAL) BY MOUTH DAILY. 30 capsule 3  . levofloxacin (LEVAQUIN) 500 MG tablet Take 1 tablet (500 mg total) by mouth daily. 10 tablet 0  . levothyroxine (SYNTHROID, LEVOTHROID) 25 MCG tablet TAKE 1  TABLET (25 MCG TOTAL) BY MOUTH DAILY BEFORE BREAKFAST. 30 tablet 3  . NON FORMULARY Take 1 tablet by mouth daily. Herbal supplement with herbs, chromium, and tea extracts. Taken for appetite suppressant.    . predniSONE (DELTASONE) 10 MG tablet Take 3 pills once daily by mouth for 3 days, then 2 pills once daily for 3 days, then 1 pill once daily for 3 days and then stop 18 tablet 0  . warfarin (COUMADIN) 1 MG tablet TAKE ONE TABLET BY MOUTH AS DIRECTED 90 tablet 0  . warfarin (COUMADIN) 4 MG tablet TAKE ONE TABLET BY MOUTH AS DIRECTED 90 tablet 0  . warfarin (COUMADIN) 5 MG tablet TAKE AS DIRECTED BY COUMADIN CLINIC 90 tablet 0   No current facility-administered medications on file prior to visit.      Review of Systems Review of Systems  Constitutional: Negative for fever, appetite change and unexpected weight change. pos for fatigue  Eyes: Negative for pain and visual disturbance.  Respiratory: pos for cough and wheezing (that is improved).   Cardiovascular: Negative for cp or palpitations    Gastrointestinal: Negative for nausea, diarrhea and constipation. pos for abdominal soreness with coughing  Genitourinary: Negative for urgency and frequency.  Skin: Negative for pallor or rash   Neurological: Negative for weakness, light-headedness, numbness and headaches.  Hematological: Negative for adenopathy. Does not bruise/bleed easily.  Psychiatric/Behavioral: Negative for dysphoric mood. The patient is not nervous/anxious.         Objective:   Physical Exam  Constitutional: She appears well-developed and well-nourished. No distress.  HENT:  Head: Normocephalic and atraumatic.  Mouth/Throat: Oropharynx is clear and moist.  Eyes: Conjunctivae and EOM are normal. Pupils are equal, round, and reactive to light. Right eye exhibits no discharge. Left eye exhibits no discharge. No scleral icterus.  Neck: Normal range of motion. Neck supple. No tracheal deviation present.  Cardiovascular:  Normal heart sounds.   milldly tachycardic - also coughing   Pulmonary/Chest: Effort normal and breath sounds normal. No respiratory distress. She has no wheezes. She has no rales. She exhibits no tenderness.  No crackles No wheeze today  No prolonged exp phase   Abdominal: Soft. Bowel sounds are normal. There is tenderness. There is no rebound and no guarding.  Abdomen is generally sore laterally (both sides)- musculature is sore  Small superficial knot felt in R mid abd under an old bruise     Lymphadenopathy:    She has no cervical adenopathy.  Neurological: She is  alert.  Skin: Skin is warm and dry. No rash noted. No erythema.  Psychiatric: She has a normal mood and affect.          Assessment & Plan:   Problem List Items Addressed This Visit      Respiratory   Acute bronchitis with bronchospasm - Primary    Improved exam- cough is still a problem Trial of tessalon for cough  mucinex to help mobilize mucous  Finishing prednisone/ done with levaquin Rev cxr   Update if not starting to improve in a week or if worsening   Pt is interested in specialist if no improvement         Other   Abdominal muscle pain    Suspect due to cough  Will watch the superficial knot felt in R abd  Disc use of heat  Also hugging pillow to cough  Update if not starting to improve in a week or if worsening

## 2014-04-16 NOTE — Assessment & Plan Note (Signed)
Suspect due to cough  Will watch the superficial knot felt in R abd  Disc use of heat  Also hugging pillow to cough  Update if not starting to improve in a week or if worsening

## 2014-04-17 ENCOUNTER — Encounter: Payer: Self-pay | Admitting: Internal Medicine

## 2014-04-17 ENCOUNTER — Ambulatory Visit (INDEPENDENT_AMBULATORY_CARE_PROVIDER_SITE_OTHER): Payer: Medicare Other | Admitting: Internal Medicine

## 2014-04-17 VITALS — BP 128/80 | HR 116 | Ht 65.0 in | Wt 172.0 lb

## 2014-04-17 DIAGNOSIS — R05 Cough: Secondary | ICD-10-CM

## 2014-04-17 DIAGNOSIS — I481 Persistent atrial fibrillation: Secondary | ICD-10-CM

## 2014-04-17 DIAGNOSIS — I4819 Other persistent atrial fibrillation: Secondary | ICD-10-CM

## 2014-04-17 DIAGNOSIS — R053 Chronic cough: Secondary | ICD-10-CM

## 2014-04-17 NOTE — Patient Instructions (Addendum)
You have been referred to Dr. Lake Bells in Pulmonary for your persistent cough   Your physician wants you to follow-up in: 6 months with Dr. Caryl Comes.  You will receive a reminder letter in the mail two months in advance. If you don't receive a letter, please call our office to schedule the follow-up appointment.

## 2014-04-17 NOTE — Progress Notes (Signed)
Electrophysiology Office Note   Date:  04/17/2014   ID:  [name], DOB 12-27-1932, MRN [id number]  PCP:  [name], MD  Cardiologist:   Primary Electrophysiologist:  Virl Axe, MD    No chief complaint on file.    History of Present Illness: [name] is a 81 y.o. female seen in followup electrophysiology evaluation for atrial fibrillation.    She has hx of  atrial flutter ablation related to prior atriotomy undertaken at [facility] in March 5732 complicated by recurrent atrial arrhythmias prompting initiation of Tikosyn  This was stopped fall 2012 Recurrent symptoms prompted [name] to reinitiate Tikosyn.  Shes had recurrent episodes of atrial fibrillation associated with a rapid ventricular response.   She underwent cardioversion and reverted again to atrial fibrillation; it is now permanent She has decided to remain on warfarin  Her ejection fraction initially was about 35-40% and repeat assessment 10/14  demonstrated 55-60% with improved rate control following the initiation of digoxin.   She is feeling considerably better with less fatigue and less exercise intolerance.   There have been issues of concern in the care of her very aged mother      Today, she denies his having problems with shortness of breath and coughing that has persisted for a couple of months. She has not had chest pain peripheral edema nocturnal dyspnea. She has had no significant change in her palpitations.   Past Medical History  Diagnosis Date  . TIA (transient ischemic attack)     facial numbness 2010  . Cardiomyopathy     rate-related-resolved  . Atrial fibrillation -persistent     a. s/p PVI [name] 2010;  b. on tikosyn/coumadin;  c. 05/2009 Echo: EF 60-65%, Gr 2 DD.  [name] History of mitral valve repair   . FH: colonic polyps   . Hypothyroidism   . Carotid stenosis     mild (hosp 3/11)- consult by vasc/ Dr Donnetta Hutching  . Pulmonary nodule   . Depression   . Asthma   . Allergy     allergic  rhinitis  . Hyperlipidemia   . Syncope and collapse   . 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 Mitral valve replacement      Ring around valve mitral valve  . Pilonidal cyst removal    . Colonoscopy  2001  . Exercise stress test  2/07  . Ct of abd and pelvis  10/07    negative  . Carotid doppler  10/07    no stenosis  . Gross hematuria on anticoagulants  10/07    neg urol work up   . Admit- syncope, bradycardia      cardiac and neuro work up  . Colonoscopy- polyps  8/08  . Tia  3/11    nl imaging and ech, with mod carotid stenosis (had vasc consul  . Appendectomy       Current Outpatient Prescriptions  Medication Sig Dispense Refill  . albuterol (PROVENTIL HFA;VENTOLIN HFA) 108 (90 BASE) MCG/ACT inhaler Inhale 2 puffs into the lungs every 4 (four) hours as needed for wheezing or shortness of breath. 1 Inhaler 0  . benzonatate (TESSALON) 200 MG capsule Take 1 capsule (200 mg total) by mouth 3 (three) times daily as needed for cough. 30 capsule 1  . digoxin (LANOXIN) 0.0625 mg TABS tablet Take 0.5 tablets (0.0625 mg total) by mouth daily. 15 tablet 11  . diltiazem (CARDIZEM CD) 180 MG 24  hr capsule TAKE 1 CAPSULE (180 MG TOTAL) BY MOUTH DAILY. 30 capsule 3  . levofloxacin (LEVAQUIN) 500 MG tablet Take 1 tablet (500 mg total) by mouth daily. 10 tablet 0  . levothyroxine (SYNTHROID, LEVOTHROID) 25 MCG tablet TAKE 1 TABLET (25 MCG TOTAL) BY MOUTH DAILY BEFORE BREAKFAST. 30 tablet 3  . NON FORMULARY Take 1 tablet by mouth daily. Herbal supplement with herbs, chromium, and tea extracts. Taken for appetite suppressant.    . predniSONE (DELTASONE) 10 MG tablet Take 3 pills once daily by mouth for 3 days, then 2 pills once daily for 3 days, then 1 pill once daily for 3 days and then stop 18 tablet 0  . warfarin (COUMADIN) 1 MG tablet TAKE ONE TABLET BY MOUTH AS DIRECTED 90 tablet 0  . warfarin (COUMADIN) 4 MG tablet TAKE ONE TABLET BY MOUTH AS  DIRECTED 90 tablet 0  . warfarin (COUMADIN) 5 MG tablet TAKE AS DIRECTED BY COUMADIN CLINIC 90 tablet 0   No current facility-administered medications for this visit.    Allergies:   Amiodarone hcl; Penicillins; and Statins   Social History:  The patient  reports that she has never smoked. She has never used smokeless tobacco. She reports that she drinks alcohol. She reports that she does not use illicit drugs.   Family History:  The patient's family history includes Alcohol abuse in her father; Cancer in her father; Hypertension in her mother; Lung cancer in her father.    ROS:  Please see the history of present illness.  .   All other systems are reviewed and negative.    PHYSICAL EXAM: VS:  There were no vitals taken for this visit. , BMI There is no weight on file to calculate BMI. GEN: Well nourished, well developed, in no acute distress HEENT: normal Neck: JVD flat, carotid bruits, or masses Cardiac: IRR RR;  no murmur  rubs,  noS4  Respiratory:  clear to auscultation bilaterally, normal work of breathing Back without kyphosis or CVAT GI: soft, nontender, nondistended, + BS MS: no deformity or atrophy Skin: warm and dry,   Extremities No Clubbing cyanosis  Edema Neuro:  Strength and sensation are intact Psych: euthymic mood, full affect  EKG:  EKG is  ordered today. The ekg ordered today a atrial fibrillation at 116 Intervals-/09/34 neck flat axis XLVIII Nonspecific ST changes   Recent Labs: 04/09/2014: ALT 11; BUN 15; Creatinine 0.71; Hemoglobin 14.1; Platelets 172.0; Potassium 4.5; Sodium 139; TSH 4.54*    Lipid Panel     Component Value Date/Time   CHOL * 06/13/2009 0430    268        ATP III CLASSIFICATION:  <200     mg/dL   Desirable  200-239  mg/dL   Borderline High  >=240    mg/dL   High          TRIG 113 06/13/2009 0430   HDL 41 06/13/2009 0430   CHOLHDL 6.5 06/13/2009 0430   VLDL 23 06/13/2009 0430   LDLCALC * 06/13/2009 0430    204        Total  Cholesterol/HDL:CHD Risk Coronary Heart Disease Risk Table                     Men   Women  1/2 Average Risk   3.4   3.3  Average Risk       5.0   4.4  2 X Average Risk   9.6   7.1  3 X Average Risk  23.4   11.0        Use the calculated Patient Ratio above and the CHD Risk Table to determine the patient's CHD Risk.        ATP III CLASSIFICATION (LDL):  <100     mg/dL   Optimal  100-129  mg/dL   Near or Above                    Optimal  130-159  mg/dL   Borderline  160-189  mg/dL   High  >190     mg/dL   Very High   LDLDIRECT 203.8 01/09/2008 0934     Wt Readings from Last 3 Encounters:  04/16/14 171 lb 6.4 oz (77.747 kg)  04/09/14 167 lb (75.751 kg)  02/13/14 168 lb 12 oz (76.544 kg)      Other studies Reviewed: Additional studies/ records that were reviewed today include: labs as noted  Review of the above records today demonstrates:    ASSESSMENT AND PLAN: Atrial fibrillation-permanent  Bronchitis  Hypothyroidism-treated  High risk medications  She is stable with her atrial fibrillation. I'm concerned about the elevated heart rate. Reviewing the old records, and her heart rate for the last year has been under 100 into the last couple of weeks which are in the context of her bronchitic illness.  In the event that her heart rates remain above 100 over the next couple of visits, I have asked her to follow-up with [name] by telephone. At that juncture we would increase her diltiazem from 180--360 mg a day.  Digoxin level was normal at last assessment 5/15. It will need to be rechecked at her next visit.  Given her bronchitis and the persistence of her cough, I've taken the liberty of setting her up to see pulmonary.  We reviewed her thyroid data. Her TSH is 4.54 with the upper limit of normal being 4.50. I told her that Dr. Alba Cory recommendation to increase her Synthroid a little bit seemed very reasonable to me.    Current medicines are reviewed at length with the  patient today.   The patient does not have concerns regarding her medicines.  The following changes were made today:  none  Labs/ tests ordered today include:    No orders of the defined types were placed in this encounter.     Disposition:   FU with me 6 month(s)   Signed, Virl Axe, MD  04/17/2014 8:13 AM     [place] [address] [place] [address] [phone] (office) [phone] (fax)

## 2014-04-23 ENCOUNTER — Encounter: Payer: Self-pay | Admitting: *Deleted

## 2014-04-24 ENCOUNTER — Other Ambulatory Visit (INDEPENDENT_AMBULATORY_CARE_PROVIDER_SITE_OTHER): Payer: Medicare Other

## 2014-04-24 DIAGNOSIS — Z5181 Encounter for therapeutic drug level monitoring: Secondary | ICD-10-CM | POA: Diagnosis not present

## 2014-04-24 LAB — POCT INR: INR: 2.8

## 2014-04-25 ENCOUNTER — Ambulatory Visit: Payer: Medicare Other | Admitting: Physician Assistant

## 2014-04-25 ENCOUNTER — Telehealth: Payer: Self-pay | Admitting: *Deleted

## 2014-04-25 ENCOUNTER — Telehealth: Payer: Self-pay | Admitting: Family Medicine

## 2014-04-25 ENCOUNTER — Ambulatory Visit (INDEPENDENT_AMBULATORY_CARE_PROVIDER_SITE_OTHER): Payer: Medicare Other | Admitting: Physician Assistant

## 2014-04-25 ENCOUNTER — Encounter: Payer: Self-pay | Admitting: Physician Assistant

## 2014-04-25 VITALS — BP 110/70 | HR 80 | Ht 65.0 in | Wt 171.0 lb

## 2014-04-25 DIAGNOSIS — K625 Hemorrhage of anus and rectum: Secondary | ICD-10-CM

## 2014-04-25 DIAGNOSIS — K648 Other hemorrhoids: Secondary | ICD-10-CM | POA: Diagnosis not present

## 2014-04-25 DIAGNOSIS — K5900 Constipation, unspecified: Secondary | ICD-10-CM

## 2014-04-25 DIAGNOSIS — Z86010 Personal history of colonic polyps: Secondary | ICD-10-CM

## 2014-04-25 DIAGNOSIS — Z7901 Long term (current) use of anticoagulants: Secondary | ICD-10-CM

## 2014-04-25 MED ORDER — WARFARIN SODIUM 5 MG PO TABS: ORAL_TABLET | ORAL | Status: DC

## 2014-04-25 MED ORDER — HYDROCORTISONE ACETATE 25 MG RE SUPP: 25.0000 mg | Freq: Two times a day (BID) | RECTAL | Status: DC

## 2014-04-25 MED ORDER — MOVIPREP 100 G PO SOLR: 1.0000 | Freq: Once | ORAL | Status: DC

## 2014-04-25 NOTE — Patient Instructions (Signed)
You have been scheduled for a colonoscopy. Please follow written instructions given to you at your visit today.  Please pick up your prep kit at the pharmacy within the next 1-3 days. If you use inhalers (even only as needed), please bring them with you on the day of your procedure. Your physician has requested that you go to www.startemmi.com and enter the access code given to you at your visit today. This web site gives a general overview about your procedure. However, you should still follow specific instructions given to you by our office regarding your preparation for the procedure.  Please purchase the following medications over the counter and take as directed: Benefiber 1 heaping tablespoon twice daily  Make certain to eat lots of "P" fruits: prunes, pears, peaches, plums, pineapple, pear juice  We have sent the following medications to your pharmacy for you to pick up at your convenience: Anusol suppositories twice daily x 10 days  CC:Dr [employer]

## 2014-04-25 NOTE — Telephone Encounter (Signed)
-----   Message from [place], [place] sent at 04/25/2014  5:14 PM EST ----- Spoken to patient and notified the result note. Patient also is requesting if we could send px to [facility] for warfain 5 mg. She is currently have px for warfain 4 mg and 1 mg.

## 2014-04-25 NOTE — Telephone Encounter (Signed)
04/25/2014  RE: [name] DOB: November 26, 1932 MRN: [id number]  Dear Dr Caryl Comes,   We have scheduled the above patient for a colonoscopy. Our records show that she is on anticoagulation therapy.  Please advise as to how long the patient may come off her therapy of coumadin prior to the procedure, which is scheduled for 05/31/14. Please route your response to Dixon Boos, CMA.   Sincerely,  Dixon Boos

## 2014-04-25 NOTE — Telephone Encounter (Signed)
I sent that to [facility] Thanks

## 2014-04-26 ENCOUNTER — Observation Stay (HOSPITAL_COMMUNITY)
Admission: EM | Admit: 2014-04-26 | Discharge: 2014-04-27 | Disposition: A | Discharge status: Home / Self Care | Payer: Medicare Other | Attending: Internal Medicine | Admitting: Internal Medicine

## 2014-04-26 ENCOUNTER — Encounter: Payer: Self-pay | Admitting: Physician Assistant

## 2014-04-26 ENCOUNTER — Encounter (HOSPITAL_COMMUNITY): Payer: Self-pay

## 2014-04-26 ENCOUNTER — Other Ambulatory Visit: Payer: Self-pay | Admitting: Family Medicine

## 2014-04-26 DIAGNOSIS — J45909 Unspecified asthma, uncomplicated: Secondary | ICD-10-CM | POA: Diagnosis not present

## 2014-04-26 DIAGNOSIS — E785 Hyperlipidemia, unspecified: Secondary | ICD-10-CM | POA: Diagnosis not present

## 2014-04-26 DIAGNOSIS — I429 Cardiomyopathy, unspecified: Secondary | ICD-10-CM | POA: Insufficient documentation

## 2014-04-26 DIAGNOSIS — K760 Fatty (change of) liver, not elsewhere classified: Secondary | ICD-10-CM | POA: Insufficient documentation

## 2014-04-26 DIAGNOSIS — I482 Chronic atrial fibrillation, unspecified: Secondary | ICD-10-CM | POA: Diagnosis present

## 2014-04-26 DIAGNOSIS — T50904A Poisoning by unspecified drugs, medicaments and biological substances, undetermined, initial encounter: Secondary | ICD-10-CM | POA: Diagnosis not present

## 2014-04-26 DIAGNOSIS — Z7901 Long term (current) use of anticoagulants: Secondary | ICD-10-CM | POA: Diagnosis not present

## 2014-04-26 DIAGNOSIS — K922 Gastrointestinal hemorrhage, unspecified: Secondary | ICD-10-CM | POA: Diagnosis not present

## 2014-04-26 DIAGNOSIS — T6591XA Toxic effect of unspecified substance, accidental (unintentional), initial encounter: Principal | ICD-10-CM | POA: Insufficient documentation

## 2014-04-26 DIAGNOSIS — Z952 Presence of prosthetic heart valve: Secondary | ICD-10-CM | POA: Diagnosis not present

## 2014-04-26 DIAGNOSIS — T461X1A Poisoning by calcium-channel blockers, accidental (unintentional), initial encounter: Secondary | ICD-10-CM | POA: Diagnosis not present

## 2014-04-26 DIAGNOSIS — I481 Persistent atrial fibrillation: Secondary | ICD-10-CM

## 2014-04-26 DIAGNOSIS — J4520 Mild intermittent asthma, uncomplicated: Secondary | ICD-10-CM | POA: Diagnosis present

## 2014-04-26 DIAGNOSIS — Z8673 Personal history of transient ischemic attack (TIA), and cerebral infarction without residual deficits: Secondary | ICD-10-CM | POA: Insufficient documentation

## 2014-04-26 DIAGNOSIS — E039 Hypothyroidism, unspecified: Secondary | ICD-10-CM | POA: Diagnosis present

## 2014-04-26 DIAGNOSIS — T50901S Poisoning by unspecified drugs, medicaments and biological substances, accidental (unintentional), sequela: Secondary | ICD-10-CM

## 2014-04-26 DIAGNOSIS — I4891 Unspecified atrial fibrillation: Secondary | ICD-10-CM | POA: Diagnosis not present

## 2014-04-26 DIAGNOSIS — T50901A Poisoning by unspecified drugs, medicaments and biological substances, accidental (unintentional), initial encounter: Secondary | ICD-10-CM | POA: Diagnosis present

## 2014-04-26 DIAGNOSIS — Z0389 Encounter for observation for other suspected diseases and conditions ruled out: Secondary | ICD-10-CM | POA: Diagnosis not present

## 2014-04-26 HISTORY — DX: Other complications of anesthesia, initial encounter: T88.59XA

## 2014-04-26 HISTORY — DX: Allergic rhinitis, unspecified: J30.9

## 2014-04-26 HISTORY — DX: Cardiac murmur, unspecified: R01.1

## 2014-04-26 HISTORY — DX: Adverse effect of unspecified anesthetic, initial encounter: T41.45XA

## 2014-04-26 LAB — BASIC METABOLIC PANEL
Anion gap: 8 (ref 5–15)
BUN: 18 mg/dL (ref 6–23)
CALCIUM: 9.1 mg/dL (ref 8.4–10.5)
CO2: 26 mmol/L (ref 19–32)
Chloride: 108 mmol/L (ref 96–112)
Creatinine, Ser: 0.76 mg/dL (ref 0.50–1.10)
GFR calc non Af Amer: 78 mL/min — ABNORMAL LOW (ref >90)
Glucose, Bld: 97 mg/dL (ref 70–99)
POTASSIUM: 3.8 mmol/L (ref 3.5–5.1)
Sodium: 142 mmol/L (ref 135–145)

## 2014-04-26 LAB — CBC
HEMATOCRIT: 37 % (ref 36.0–46.0)
HEMOGLOBIN: 12.2 g/dL (ref 12.0–15.0)
MCH: 28.2 pg (ref 26.0–34.0)
MCHC: 33 g/dL (ref 30.0–36.0)
MCV: 85.5 fL (ref 78.0–100.0)
Platelets: 184 10*3/uL (ref 150–400)
RBC: 4.33 MIL/uL (ref 3.87–5.11)
RDW: 14.8 % (ref 11.5–15.5)
WBC: 4.8 10*3/uL (ref 4.0–10.5)

## 2014-04-26 LAB — TSH: TSH: 1.749 u[IU]/mL (ref 0.350–4.500)

## 2014-04-26 LAB — PROTIME-INR
INR: 2.76 — ABNORMAL HIGH (ref 0.00–1.49)
Prothrombin Time: 29.4 seconds — ABNORMAL HIGH (ref 11.6–15.2)

## 2014-04-26 LAB — DIGOXIN LEVEL: Digoxin Level: 0.5 ng/mL — ABNORMAL LOW (ref 0.8–2.0)

## 2014-04-26 MED ORDER — ONDANSETRON HCL 4 MG PO TABS: 4.0000 mg | ORAL_TABLET | Freq: Four times a day (QID) | ORAL | Status: DC | PRN

## 2014-04-26 MED ORDER — ACETAMINOPHEN 500 MG PO TABS
500.0000 mg | ORAL_TABLET | Freq: Four times a day (QID) | ORAL | Status: DC | PRN
  Administered 2014-04-26 – 2014-04-27 (×3): 500 mg via ORAL
  Filled 2014-04-26 (×3): qty 1

## 2014-04-26 MED ORDER — HYDROCODONE-ACETAMINOPHEN 5-325 MG PO TABS: 1.0000 | ORAL_TABLET | ORAL | Status: DC | PRN

## 2014-04-26 MED ORDER — HYDROCORTISONE ACETATE 25 MG RE SUPP
25.0000 mg | Freq: Two times a day (BID) | RECTAL | Status: DC
  Administered 2014-04-26: 25 mg via RECTAL
  Filled 2014-04-26 (×3): qty 1

## 2014-04-26 MED ORDER — ALBUTEROL SULFATE (2.5 MG/3ML) 0.083% IN NEBU: 2.5000 mg | INHALATION_SOLUTION | RESPIRATORY_TRACT | Status: DC | PRN

## 2014-04-26 MED ORDER — ALBUTEROL SULFATE HFA 108 (90 BASE) MCG/ACT IN AERS: 2.0000 | INHALATION_SPRAY | RESPIRATORY_TRACT | Status: DC | PRN

## 2014-04-26 MED ORDER — SODIUM CHLORIDE 0.9 % IJ SOLN: 3.0000 mL | Freq: Two times a day (BID) | INTRAMUSCULAR | Status: DC

## 2014-04-26 MED ORDER — SODIUM CHLORIDE 0.9 % IV SOLN
INTRAVENOUS | Status: AC
  Administered 2014-04-26 – 2014-04-27 (×2): via INTRAVENOUS

## 2014-04-26 MED ORDER — DIGOXIN 0.0625 MG HALF TABLET
0.0625 mg | ORAL_TABLET | Freq: Every day | ORAL | Status: DC
  Administered 2014-04-27: 0.0625 mg via ORAL
  Filled 2014-04-26 (×2): qty 1

## 2014-04-26 MED ORDER — LEVOTHYROXINE SODIUM 25 MCG PO TABS
25.0000 ug | ORAL_TABLET | Freq: Every day | ORAL | Status: DC
  Administered 2014-04-27: 25 ug via ORAL
  Filled 2014-04-26 (×2): qty 1

## 2014-04-26 MED ORDER — ONDANSETRON HCL 4 MG/2ML IJ SOLN: 4.0000 mg | Freq: Four times a day (QID) | INTRAMUSCULAR | Status: DC | PRN

## 2014-04-26 MED ORDER — DILTIAZEM HCL 25 MG/5ML IV SOLN
10.0000 mg | INTRAVENOUS | Status: DC | PRN
  Filled 2014-04-26: qty 5

## 2014-04-26 MED ORDER — ALUM & MAG HYDROXIDE-SIMETH 200-200-20 MG/5ML PO SUSP: 30.0000 mL | Freq: Four times a day (QID) | ORAL | Status: DC | PRN

## 2014-04-26 MED ORDER — GUAIFENESIN-DM 100-10 MG/5ML PO SYRP: 5.0000 mL | ORAL_SOLUTION | ORAL | Status: DC | PRN

## 2014-04-26 MED ORDER — POLYETHYLENE GLYCOL 3350 17 G PO PACK
17.0000 g | PACK | Freq: Every day | ORAL | Status: DC | PRN
  Filled 2014-04-26: qty 1

## 2014-04-26 NOTE — ED Notes (Signed)
Attempted report 

## 2014-04-26 NOTE — Progress Notes (Signed)
Patient ID: [name], female   DOB: 1933-01-30, 81 y.o.   MRN: [id number]    HPI:    [name] is a delightful 81 year old female referred for evaluation by Dr. Glori Bickers do to hematochezia.  [name] says that several months ago she went on a prolonged trip. Through her travels she developed a change in her bowel habits and became constipated. Upon arriving home. She started a colon cleanse which she used for several days and began to notice an abnormal texture to her stools as well as rectal bleeding. She has had multiple episodes of blood dripping in the commode as well as blood on the toilet tissue and blood striping on the stools. She states that last week she had an episode of bleeding that lasted for 48 hours. She describes this has blood dripping with bowel movements and having a small amount of staining in her undergarments. She had no associated abdominal pain. Her appetite as been good and her weight has been stable. She has no rectal pain or itching but does have a sensation of incomplete evacuation. She has been drinking a lot of water and increasing her fiber and her stools have regulated however she is extremely anxious over her frequent rectal bleeding. She has a history of colon polyps and her last colonoscopy was in August 2011 at which time a tubular adenoma was removed from the splenic flexure she was advised to have surveillance in 5 years which would be this summer but she would like it performed now as she has been having problems. The patient has a history of atrial fibrillation and rate related cardiomyopathy. She is status post ablation therapy and is currently on Coumadin.  Past Medical History  Diagnosis Date  . TIA (transient ischemic attack)     facial numbness 2010  . Cardiomyopathy     rate-related-resolved  . Atrial fibrillation -persistent     a. s/p PVI [name] 2010;  b. on tikosyn/coumadin;  c. 05/2009 Echo: EF 60-65%, Gr 2 DD.  [name] History of mitral valve repair   . FH: colonic  polyps   . Hypothyroidism   . Carotid stenosis     mild (hosp 3/11)- consult by vasc/ Dr Donnetta Hutching  . Pulmonary nodule   . Depression   . Asthma   . Allergy     allergic rhinitis  . Hyperlipidemia   . Syncope and collapse   . 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 Diverticulosis   . Fatty liver   . Tubular adenoma     Past Surgical History  Procedure Laterality Date  . Mitral valve replacement      Ring around valve mitral valve  . Pilonidal cyst removal    . Exercise stress test  2/07  . Ct of abd and pelvis  10/07    negative  . Carotid doppler  10/07    no stenosis  . Gross hematuria on anticoagulants  10/07    neg urol work up   . Admit- syncope, bradycardia      cardiac and neuro work up  . Tia  3/11    nl imaging and ech, with mod carotid stenosis (had vasc consul  . Appendectomy     Family History  Problem Relation Age of Onset  . Hypertension Mother   . Lung cancer Father     smoker  . Alcohol abuse Father   . Cancer Father     bladder and lung CA smoker   History  Substance  Use Topics  . Smoking status: Never Smoker   . Smokeless tobacco: Never Used  . Alcohol Use: 0.0 oz/week    0 Not specified per week     Comment: occasional glass of winre   Current Outpatient Prescriptions  Medication Sig Dispense Refill  . albuterol (PROVENTIL HFA;VENTOLIN HFA) 108 (90 BASE) MCG/ACT inhaler Inhale 2 puffs into the lungs every 4 (four) hours as needed for wheezing or shortness of breath. 1 Inhaler 0  . digoxin (LANOXIN) 0.0625 mg TABS tablet Take 0.5 tablets (0.0625 mg total) by mouth daily. 15 tablet 11  . diltiazem (CARDIZEM CD) 180 MG 24 hr capsule TAKE 1 CAPSULE (180 MG TOTAL) BY MOUTH DAILY. 30 capsule 3  . levothyroxine (SYNTHROID, LEVOTHROID) 25 MCG tablet TAKE 1 TABLET (25 MCG TOTAL) BY MOUTH DAILY BEFORE BREAKFAST. 30 tablet 3  . NON FORMULARY Take 1 tablet by mouth daily. Herbal supplement with herbs, chromium, and tea extracts. Taken for appetite suppressant.      . warfarin (COUMADIN) 1 MG tablet TAKE ONE TABLET BY MOUTH AS DIRECTED 90 tablet 0  . warfarin (COUMADIN) 4 MG tablet TAKE ONE TABLET BY MOUTH AS DIRECTED 90 tablet 0  . hydrocortisone (ANUSOL-HC) 25 MG suppository Place 1 suppository (25 mg total) rectally 2 (two) times daily. X 10 days 20 suppository 0  . MOVIPREP 100 G SOLR Take 1 kit (200 g total) by mouth once. 1 kit 0  . warfarin (COUMADIN) 5 MG tablet Take by mouth as directed 90 tablet 1   No current facility-administered medications for this visit.   Allergies  Allergen Reactions  . Amiodarone Hcl     REACTION: Intolerance  . Penicillins     REACTION: rash  . Statins     REACTION: rash     Review of Systems: Gen: Denies any fever, chills, sweats, anorexia, fatigue, weakness, malaise, weight loss, and sleep disorder CV: Denies chest pain, angina, palpitations, syncope, orthopnea, PND, peripheral edema, and claudication. Resp: Denies dyspnea at rest, dyspnea with exercise, cough, sputum, wheezing, coughing up blood, and pleurisy. GI: Denies vomiting blood, jaundice, and fecal incontinence.   Denies dysphagia or odynophagia. GU : Denies urinary burning, blood in urine, urinary frequency, urinary hesitancy, nocturnal urination, and urinary incontinence. MS: Denies joint pain, limitation of movement, and swelling, stiffness, low back pain, extremity pain. Denies muscle weakness, cramps, atrophy.  Derm: Denies rash, itching, dry skin, hives, moles, warts, or unhealing ulcers.  Psych: Denies depression, anxiety, memory loss, suicidal ideation, hallucinations, paranoia, and confusion. Heme: Denies bruising, bleeding, and enlarged lymph nodes. Neuro:  Denies any headaches, dizziness, paresthesias. Endo:  Denies any problems with DM, thyroid, adrenal function  Studies: Dg Chest 2 View  04/10/2014   CLINICAL DATA:  81 year old female with productive cough and malaise. Acute bronchitis and bronchospasm. Initial encounter.  EXAM:  CHEST  2 VIEW  COMPARISON:  02/13/2014 and earlier.  FINDINGS: Stable lung volumes. Normal cardiac size and mediastinal contours. Visualized tracheal air column is within normal limits. No pneumothorax. No pleural effusion or consolidation. Chronically asymmetric increased markings at the left lung base. No definite acute airspace opacity. No acute osseous abnormality identified.  IMPRESSION: No definite acute cardiopulmonary abnormality. Chronic asymmetric increased markings at the left lung base.   Electronically Signed   By: Lars Pinks M.D.   On: 04/10/2014 08:30      Recent Labs  04/24/14 1656  INR 2.8     Prior Endoscopies:   Colonoscopy in 2008 demonstrated 2 adenomatous  polyps Colonoscopy in August 2011 had removal of a tubular adenoma at the splenic flexure and she has been advised to have surveillance in 5 years.  Physical Exam: BP 110/70 mmHg  Pulse 80  Ht '5\' 5"'  (1.651 m)  Wt 171 lb (77.565 kg)  BMI 28.46 kg/m2 Constitutional: Pleasant,well-developed female in no acute distress. HEENT: Normocephalic and atraumatic. Conjunctivae are normal. No scleral icterus. Neck supple. No cervical adenopathy Cardiovascular:irreg irreg Pulmonary/chest: Effort normal and breath sounds normal. No wheezing, rales or rhonchi. Abdominal: Soft, nondistended, nontender. Bowel sounds active throughout. There are no masses palpable. No hepatomegaly. Rectal: brown stool heme negative, internal hemorroid Extremities: no edema Lymphadenopathy: No cervical adenopathy noted. Neurological: Alert and oriented to person place and time. Skin: Skin is warm and dry. No rashes noted. Psychiatric: Normal mood and affect. Behavior is normal.  ASSESSMENT AND PLAN: 81 year old female with a personal history of adenomatous polyps status post a recent change in bowel habits and ongoing intermittent rectal bleeding referred for evaluation. Her bleeding may be hemorrhoidal in nature as she had been straining with  her hard stools. He will be given a trial of Anusol HC suppositories twice a day for 10 days. She has a history of adenomatous polyp she will be scheduled for surveillance colonoscopy to evaluate for recurrent polyps or neoplasia.The risks, benefits, and alternatives to colonoscopy with possible biopsy and possible polypectomy were discussed with the patient and they consent to proceed. The risk of holding anticoagulation therapy or antiplatelet medications was discussed including the increased risk for thromboembolic disease that may include DVT, pulmonary emboli and stroke. The patient understands this risk and is willing to proceed with temporally holding the medication provided that this is approved by her PCP or cardiologist. The procedure will be scheduled with Dr. Deatra Ina.    Gor Vestal P PA-C 04/26/2014, 8:02 AM

## 2014-04-26 NOTE — ED Notes (Signed)
Heart healthy tray ordered for patient.  

## 2014-04-26 NOTE — Progress Notes (Signed)
Reviewed and agree with management. Joci Dress D. Brayn Eckstein, M.D., FACG  

## 2014-04-26 NOTE — ED Notes (Signed)
Assisted pt to bathroom. Pt ambulated without any difficulty. Pt returned to room and placed back on monitor.

## 2014-04-26 NOTE — ED Provider Notes (Signed)
CSN: [id number]     Arrival date & time 04/26/14  1015 History   First MD Initiated Contact with Patient 04/26/14 1019     Chief Complaint  Patient presents with  . Medication Reaction     (Consider location/radiation/quality/duration/timing/severity/associated sxs/prior Treatment) HPI Comments: Took 4 pills prior to a dentist appointment. Is supposed to take clindamycin prior to dentist appointment due to prior heart valve repair. Patient thinks she took 4 of her 24-hr cardizem tablets, however she said they were 10 mg tablet, she doesn't have any 10 mg tablet medications. She is also concerned she could've had her mother's medications. Unsure what medicines those are.  Patient is a 81 y.o. female presenting with Overdose. The history is provided by the patient.  Drug Overdose This is a new problem. The current episode started less than 1 hour ago. The problem occurs constantly. The problem has not changed since onset.Pertinent negatives include no chest pain and no shortness of breath. Nothing aggravates the symptoms. She has tried nothing for the symptoms.    Past Medical History  Diagnosis Date  . TIA (transient ischemic attack)     facial numbness 2010  . Cardiomyopathy     rate-related-resolved  . Atrial fibrillation -persistent     a. s/p PVI [name] 2010;  b. on tikosyn/coumadin;  c. 05/2009 Echo: EF 60-65%, Gr 2 DD.  [name] History of mitral valve repair   . FH: colonic polyps   . Hypothyroidism   . Carotid stenosis     mild (hosp 3/11)- consult by vasc/ Dr Donnetta Hutching  . Pulmonary nodule   . Depression   . Asthma   . Allergy     allergic rhinitis  . Hyperlipidemia   . Syncope and collapse   . 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 Diverticulosis   . Fatty liver   . Tubular adenoma    Past Surgical History  Procedure Laterality Date  . Mitral valve replacement      Ring around valve mitral valve  . Pilonidal cyst removal    . Exercise stress test  2/07  . Ct of abd and pelvis   10/07    negative  . Carotid doppler  10/07    no stenosis  . Gross hematuria on anticoagulants  10/07    neg urol work up   . Admit- syncope, bradycardia      cardiac and neuro work up  . Tia  3/11    nl imaging and ech, with mod carotid stenosis (had vasc consul  . Appendectomy     Family History  Problem Relation Age of Onset  . Hypertension Mother   . Lung cancer Father     smoker  . Alcohol abuse Father   . Cancer Father     bladder and lung CA smoker   History  Substance Use Topics  . Smoking status: Never Smoker   . Smokeless tobacco: Never Used  . Alcohol Use: 0.0 oz/week    0 Not specified per week     Comment: occasional glass of winre   OB History    No data available     Review of Systems  Constitutional: Negative for fever.  Respiratory: Negative for cough and shortness of breath.   Cardiovascular: Negative for chest pain and leg swelling.  Gastrointestinal: Negative for vomiting.  All other systems reviewed and are negative.     Allergies  Amiodarone hcl; Penicillins; and Statins  Home Medications   Prior to Admission  medications   Medication Sig Start Date End Date Taking? Authorizing Provider  albuterol (PROVENTIL HFA;VENTOLIN HFA) 108 (90 BASE) MCG/ACT inhaler Inhale 2 puffs into the lungs every 4 (four) hours as needed for wheezing or shortness of breath. 01/26/14   Abner Greenspan, MD  digoxin (LANOXIN) 0.0625 mg TABS tablet Take 0.5 tablets (0.0625 mg total) by mouth daily. 02/23/13   Deboraha Sprang, MD  diltiazem (CARDIZEM CD) 180 MG 24 hr capsule TAKE 1 CAPSULE (180 MG TOTAL) BY MOUTH DAILY. 02/21/14   Deboraha Sprang, MD  hydrocortisone (ANUSOL-HC) 25 MG suppository Place 1 suppository (25 mg total) rectally 2 (two) times daily. X 10 days 04/25/14   Lori P Hvozdovic, PA-C  levothyroxine (SYNTHROID, LEVOTHROID) 25 MCG tablet TAKE 1 TABLET (25 MCG TOTAL) BY MOUTH DAILY BEFORE BREAKFAST. 04/26/14   Abner Greenspan, MD  MOVIPREP 100 G SOLR Take 1 kit (200  g total) by mouth once. 04/25/14   Lori P Hvozdovic, PA-C  NON FORMULARY Take 1 tablet by mouth daily. Herbal supplement with herbs, chromium, and tea extracts. Taken for appetite suppressant.    Historical Provider, MD  warfarin (COUMADIN) 1 MG tablet TAKE ONE TABLET BY MOUTH AS DIRECTED 02/05/14   Abner Greenspan, MD  warfarin (COUMADIN) 4 MG tablet TAKE ONE TABLET BY MOUTH AS DIRECTED 05/30/12   Abner Greenspan, MD  warfarin (COUMADIN) 5 MG tablet Take by mouth as directed 04/25/14   Abner Greenspan, MD   BP 117/58 mmHg  Pulse 80  Temp(Src) 97.4 F (36.3 C) (Oral)  Resp 20  Ht 5' 5" (1.651 m)  Wt 170 lb (77.111 kg)  BMI 28.29 kg/m2  SpO2 98% Physical Exam  Constitutional: She is oriented to person, place, and time. She appears well-developed and well-nourished. No distress.  HENT:  Head: Normocephalic and atraumatic.  Mouth/Throat: Oropharynx is clear and moist.  Eyes: EOM are normal. Pupils are equal, round, and reactive to light.  Neck: Normal range of motion. Neck supple.  Cardiovascular: Normal rate.  An irregular rhythm present. Exam reveals no friction rub.   No murmur heard. Pulmonary/Chest: Effort normal and breath sounds normal. No respiratory distress. She has no wheezes. She has no rales.  Abdominal: Soft. She exhibits no distension. There is no tenderness. There is no rebound.  Musculoskeletal: Normal range of motion. She exhibits no edema.  Neurological: She is alert and oriented to person, place, and time.  Skin: She is not diaphoretic.  Nursing note and vitals reviewed.   ED Course  Procedures (including critical care time) Labs Review Labs Reviewed  BASIC METABOLIC PANEL - Abnormal; Notable for the following:    GFR calc non Af Amer 78 (*)    All other components within normal limits  DIGOXIN LEVEL - Abnormal; Notable for the following:    Digoxin Level 0.5 (*)    All other components within normal limits  PROTIME-INR - Abnormal; Notable for the following:     Prothrombin Time 29.4 (*)    INR 2.76 (*)    All other components within normal limits  BASIC METABOLIC PANEL - Abnormal; Notable for the following:    GFR calc non Af Amer 77 (*)    GFR calc Af Amer 90 (*)    All other components within normal limits  DIGOXIN LEVEL - Abnormal; Notable for the following:    Digoxin Level <0.2 (*)    All other components within normal limits  CBC  TSH  Imaging Review No results found.   EKG Interpretation   Date/Time:  Thursday April 26 2014 10:21:53 EST Ventricular Rate:  87 PR Interval:    QRS Duration: 102 QT Interval:  338 QTC Calculation: 407 R Axis:   -15 Text Interpretation:  Atrial fibrillation Ventricular premature complex  Borderline left axis deviation In Afib, similar to previous Confirmed by  Mingo Amber  MD, [place] ([id number]) on 04/26/2014 10:38:45 AM      MDM   Final diagnoses:  Drug overdose, accidental or unintentional, initial encounter    33F here with drug overdose. Was supposed to take clindamycin, but didn't have same taste in her mouth, so doesn't think it was clindamycin. Could be digoxin vs cardizem. Could be one of her mother's meds. She told EMS it was cardizem, but then backtracked on that when I asked her. Will hold off on Calcium in case she took digoxin. No bradycardia, normal exam here. Poison control recommended admission for observation, patient comfortable with this plan.   Evelina Bucy, MD 04/28/14 [phone]

## 2014-04-26 NOTE — H&P (Signed)
Patient Demographics  [name], is a 81 y.o. female  MRN: [id number]   DOB - 05/02/1932  Admit Date - 04/26/2014  Outpatient Primary MD for the patient is [name], MD   With History of -  Past Medical History  Diagnosis Date  . TIA (transient ischemic attack)     facial numbness 2010  . Cardiomyopathy     rate-related-resolved  . Atrial fibrillation -persistent     a. s/p PVI [name] 2010;  b. on tikosyn/coumadin;  c. 05/2009 Echo: EF 60-65%, Gr 2 DD.  [name] History of mitral valve repair   . FH: colonic polyps   . Hypothyroidism   . Carotid stenosis     mild (hosp 3/11)- consult by vasc/ Dr Donnetta Hutching  . Pulmonary nodule   . Depression   . Asthma   . Allergy     allergic rhinitis  . Hyperlipidemia   . Syncope and collapse   . 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 Diverticulosis   . Fatty liver   . Tubular adenoma       Past Surgical History  Procedure Laterality Date  . Mitral valve replacement      Ring around valve mitral valve  . Pilonidal cyst removal    . Exercise stress test  2/07  . Ct of abd and pelvis  10/07    negative  . Carotid doppler  10/07    no stenosis  . Gross hematuria on anticoagulants  10/07    neg urol work up   . Admit- syncope, bradycardia      cardiac and neuro work up  . Tia  3/11    nl imaging and ech, with mod carotid stenosis (had vasc consul  . Appendectomy      in for   Chief Complaint  Patient presents with  . Medication Reaction     HPI  [name]  is a 81 y.o. female, with history of atrial fibrillation on Coumadin follows with Dr. Caryl Comes, mitral valve repair, TIA, hypothyroidism, dyslipidemia, fatty liver, who usually takes clindamycin before dental procedures, was due for a dental procedure today, she was getting late so she took 4 pills thinking they  were clindamycin and left home, later she realized that she might have taken the wrong pills from her old medicine cabinet. She does not know what pills they were. He came to the ER. Did not have any subjective complaints.   In the ER ED physician discussed the case with poison control for accidental overdose of unknown medication. Poison control advised 24-hour monitoring. Patient has stable EKG without any bradycardia, she is symptom free. She will be monitored for 24 hours.    Review of Systems    In addition to the HPI above,   No Fever-chills, No Headache, No changes with Vision or hearing, No problems swallowing food or Liquids, No Chest pain, Cough or Shortness of Breath, No  Abdominal pain, No Nausea or Vommitting, Bowel movements are regular, No Blood in Urine, intermittent hemorrhoidal bleed No dysuria, No new skin rashes or bruises, No new joints pains-aches,  No new weakness, tingling, numbness in any extremity, No recent weight gain or loss, No polyuria, polydypsia or polyphagia, No significant Mental Stressors.  A full 10 point Review of Systems was done, except as stated above, all other Review of Systems were negative.   Social History History  Substance Use Topics  . Smoking status: Never Smoker   . Smokeless tobacco: Never Used  . Alcohol Use: 0.0 oz/week    0 Not specified per week     Comment: occasional glass of winre      Family History Family History  Problem Relation Age of Onset  . Hypertension Mother   . Lung cancer Father     smoker  . Alcohol abuse Father   . Cancer Father     bladder and lung CA smoker      Prior to Admission medications   Medication Sig Start Date End Date Taking? Authorizing Provider  albuterol (PROVENTIL HFA;VENTOLIN HFA) 108 (90 BASE) MCG/ACT inhaler Inhale 2 puffs into the lungs every 4 (four) hours as needed for wheezing or shortness of breath. 01/26/14   Abner Greenspan, MD  digoxin (LANOXIN) 0.0625 mg TABS tablet  Take 0.5 tablets (0.0625 mg total) by mouth daily. 02/23/13   Deboraha Sprang, MD  diltiazem (CARDIZEM CD) 180 MG 24 hr capsule TAKE 1 CAPSULE (180 MG TOTAL) BY MOUTH DAILY. 02/21/14   Deboraha Sprang, MD  hydrocortisone (ANUSOL-HC) 25 MG suppository Place 1 suppository (25 mg total) rectally 2 (two) times daily. X 10 days 04/25/14   Lori P Hvozdovic, PA-C  levothyroxine (SYNTHROID, LEVOTHROID) 25 MCG tablet TAKE 1 TABLET (25 MCG TOTAL) BY MOUTH DAILY BEFORE BREAKFAST. 04/26/14   Abner Greenspan, MD  MOVIPREP 100 G SOLR Take 1 kit (200 g total) by mouth once. 04/25/14   Lori P Hvozdovic, PA-C  NON FORMULARY Take 1 tablet by mouth daily. Herbal supplement with herbs, chromium, and tea extracts. Taken for appetite suppressant.    Historical Provider, MD  warfarin (COUMADIN) 1 MG tablet TAKE ONE TABLET BY MOUTH AS DIRECTED 02/05/14   Abner Greenspan, MD  warfarin (COUMADIN) 4 MG tablet TAKE ONE TABLET BY MOUTH AS DIRECTED 05/30/12   Abner Greenspan, MD  warfarin (COUMADIN) 5 MG tablet Take by mouth as directed 04/25/14   Abner Greenspan, MD    Allergies  Allergen Reactions  . Amiodarone Hcl     REACTION: Intolerance  . Penicillins     REACTION: rash  . Statins     REACTION: rash    Physical Exam  Vitals  Blood pressure 102/51, pulse 89, temperature 97.4 F (36.3 C), temperature source Oral, resp. rate 20, height '5\' 5"'  (1.651 m), weight 77.111 kg (170 lb), SpO2 95 %.   1. General elderly white female lying in bed in NAD,    2. Normal affect and insight, Not Suicidal or Homicidal, Awake Alert, Oriented X 3.  3. No F.N deficits, ALL C.Nerves Intact, Strength 5/5 all 4 extremities, Sensation intact all 4 extremities, Plantars down going.  4. Ears and Eyes appear Normal, Conjunctivae clear, PERRLA. Moist Oral Mucosa.  5. Supple Neck, No JVD, No cervical lymphadenopathy appriciated, No Carotid Bruits.  6. Symmetrical Chest wall movement, Good air movement bilaterally, CTAB.  7. RRR, No Gallops, Rubs or  Murmurs, No Parasternal  Heave.  8. Positive Bowel Sounds, Abdomen Soft, No tenderness, No organomegaly appriciated,No rebound -guarding or rigidity.  9.  No Cyanosis, Normal Skin Turgor, No Skin Rash or Bruise.  10. Good muscle tone,  joints appear normal , no effusions, Normal ROM.  11. No Palpable Lymph Nodes in Neck or Axillae     Data Review  CBC  Recent Labs Lab 04/26/14 1030  WBC 4.8  HGB 12.2  HCT 37.0  PLT 184  MCV 85.5  MCH 28.2  MCHC 33.0  RDW 14.8   ------------------------------------------------------------------------------------------------------------------  Chemistries   Recent Labs Lab 04/26/14 1030  NA 142  K 3.8  CL 108  CO2 26  GLUCOSE 97  BUN 18  CREATININE 0.76  CALCIUM 9.1   ------------------------------------------------------------------------------------------------------------------ estimated creatinine clearance is 58.5 mL/min (by C-G formula based on Cr of 0.76). ------------------------------------------------------------------------------------------------------------------ No results for input(s): TSH, T4TOTAL, T3FREE, THYROIDAB in the last 72 hours.  Invalid input(s): FREET3   Coagulation profile  Recent Labs Lab 04/24/14 1656  INR 2.8   ------------------------------------------------------------------------------------------------------------------- No results for input(s): DDIMER in the last 72 hours. -------------------------------------------------------------------------------------------------------------------  Cardiac Enzymes No results for input(s): CKMB, TROPONINI, MYOGLOBIN in the last 168 hours.  Invalid input(s): CK ------------------------------------------------------------------------------------------------------------------ Invalid input(s): POCBNP   ---------------------------------------------------------------------------------------------------------------  Urinalysis    Component  Value Date/Time   COLORURINE RED* 02/08/2012 0324   APPEARANCEUR TURBID* 02/08/2012 0324   LABSPEC 1.028 02/08/2012 0324   PHURINE 5.0 02/08/2012 0324   GLUCOSEU NEGATIVE 02/08/2012 0324   HGBUR LARGE* 02/08/2012 0324   BILIRUBINUR LARGE* 02/08/2012 0324   KETONESUR 15* 02/08/2012 0324   PROTEINUR 100* 02/08/2012 0324   UROBILINOGEN 0.2 02/08/2012 0324   NITRITE POSITIVE* 02/08/2012 0324   LEUKOCYTESUR MODERATE* 02/08/2012 0324    ----------------------------------------------------------------------------------------------------------------  Imaging results:   No results found.  My personal review of EKG: RhythmAfib, 70s,  few PVCs, no Acute ST changes    Assessment & Plan   1.  Accidental overdose of unknown medication. Stable and symptom-free, EKG no acute changes, will be monitored on telemetry, currently blood pressure stable. Gently hydrate. Telemetry monitor. If stable discharge in 24 hours.   2. Chronic Atrial fibrillation [place] Vasc 2 is 4 - . Patient was suspicious that the 4 pills she took were Cardizem, currently not bradycardic, will hold scheduled Cardizem and will order IV pushes if needed for now. Continue digoxin level stable. Monitor on telemetry. Pharmacy to monitor Coumadin.  3. Hypothyroidism. Check TSH continue home dose Synthroid.   4. Hemorrhoidal lower GI bleed intermittent. Continue Anusol suppository as needed. Outpatient GI follow-up as scheduled with Dr. Deatra Ina.    DVT Prophylaxis Coumadin  AM Labs Ordered, also please review Full Orders  Family Communication: Admission, patients condition and plan of care including tests being ordered have been discussed with the patient   who indicates understanding and agree with the plan and Code Status.  Code Status Full  Likely DC to  Home  Condition Fair  Time spent in minutes : 35    Buford Bremer K M.D on 04/26/2014 at 11:57 AM  Between 7am to 7pm - Pager - [phone]  After 7pm go to  [email or web address] - [facility] Office  [phone]

## 2014-04-26 NOTE — ED Notes (Signed)
Pt states possibly took 40 mg Cardizem accidentally instead of clindamycin. Pt denies any symptoms at this time.

## 2014-04-27 DIAGNOSIS — T6591XA Toxic effect of unspecified substance, accidental (unintentional), initial encounter: Secondary | ICD-10-CM | POA: Diagnosis not present

## 2014-04-27 DIAGNOSIS — T50901S Poisoning by unspecified drugs, medicaments and biological substances, accidental (unintentional), sequela: Secondary | ICD-10-CM | POA: Diagnosis not present

## 2014-04-27 LAB — BASIC METABOLIC PANEL
Anion gap: 6 (ref 5–15)
BUN: 16 mg/dL (ref 6–23)
CO2: 28 mmol/L (ref 19–32)
CREATININE: 0.78 mg/dL (ref 0.50–1.10)
Calcium: 8.9 mg/dL (ref 8.4–10.5)
Chloride: 109 mmol/L (ref 96–112)
GFR calc Af Amer: 90 mL/min — ABNORMAL LOW (ref >90)
GFR, EST NON AFRICAN AMERICAN: 77 mL/min — AB (ref >90)
Glucose, Bld: 94 mg/dL (ref 70–99)
Potassium: 3.9 mmol/L (ref 3.5–5.1)
SODIUM: 143 mmol/L (ref 135–145)

## 2014-04-27 LAB — DIGOXIN LEVEL: Digoxin Level: <0.2 ng/mL — ABNORMAL LOW (ref 0.8–2.0)

## 2014-04-27 MED ORDER — WARFARIN SODIUM 5 MG PO TABS
5.0000 mg | ORAL_TABLET | Freq: Every day | ORAL | Status: DC
  Filled 2014-04-27: qty 1

## 2014-04-27 MED ORDER — WARFARIN - PHARMACIST DOSING INPATIENT: Freq: Every day | Status: DC

## 2014-04-27 NOTE — Progress Notes (Signed)
04/27/2014 1:13 PM D/c avs form, medications already taken today and those due this evening given and explained to patient. Follow up appointments and when to call MD reviewed. RX reviewed. D/c iv. D/c tele. D/c home per orders.  Hunner Garcon, Arville Lime

## 2014-04-27 NOTE — Discharge Summary (Signed)
ARADHANA GIN, 81 y.o., DOB 22-Jul-1932, MRN [id number]. Admission date: 04/26/2014 Discharge Date 04/27/2014 Primary MD [name], MD Admitting Physician Thurnell Lose, MD  PCP please follow: - Please check CBC, BMP during next visit.  Admission Diagnosis  medication overdose  Discharge Diagnosis   Principal Problem:   Accidental overdose Active Problems:   Hypothyroidism   Atrial fibrillation   Asthma   Cardiomyopathy, secondary --Resolved again 10/14   Drug overdose      Past Medical History  Diagnosis Date  . TIA (transient ischemic attack) 2010    "left facial numbness after I drank a thermos of iced coffee; not sure it was a TIA"  . Cardiomyopathy     rate-related-resolved  . Atrial fibrillation -persistent     a. s/p PVI [name] 2010;  b. on tikosyn/coumadin;  c. 05/2009 Echo: EF 60-65%, Gr 2 DD.  [name] FH: colonic polyps   . Carotid stenosis     mild (hosp 3/11)- consult by vasc/ Dr Donnetta Hutching  . Pulmonary nodule   . Depression   . Hyperlipidemia   . Syncope and collapse   . 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 Diverticulosis   . Fatty liver   . Tubular adenoma   . Complication of anesthesia     "I have trouble waking up; I'm very sensitive to anesthesia"  . Heart murmur     "prior to valve repair"  . Asthma     "brought out by smoke, air quality"  . Pneumonia 1951    "double"  . Allergic rhinitis     Past Surgical History  Procedure Laterality Date  . Mitral valve annuloplasty  10/23/1998    "Model 4625; Seriel [id number]"; size 68mm; [facility]; Dr. Boyce Medici  . Pilonidal cyst excision  1954  . Exercise stress test  2/07  . Ct of abd and pelvis  10/07    negative  . Carotid doppler  10/07    no stenosis  . Gross hematuria on anticoagulants  10/07    neg urol work up   . Admit- syncope, bradycardia      cardiac and neuro work up  . Cardiac valve replacement    . Av node ablation      "2 @ [name]; 1 @ [place]"  . Appendectomy  1978  . Tubal ligation  1978  .  Excisional hemorrhoidectomy  1990's  . Dilation and curettage of uterus       Hospital Course See H&P, Labs, Consult and Test reports for all details in brief, patient was admitted for **  Principal Problem:   Accidental overdose Active Problems:   Hypothyroidism   Atrial fibrillation   Asthma   Cardiomyopathy, secondary --Resolved again 10/14   Drug overdose  Minaal Struckman is a 81 y.o. female, with history of atrial fibrillation on Coumadin follows with Dr. Caryl Comes, mitral valve repair, TIA, hypothyroidism, dyslipidemia, fatty liver, who usually takes clindamycin before dental procedures, was due for a dental procedure today, she was getting late so she took 4 pills thinking they were clindamycin and left home, later she realized that she might have taken the wrong pills from her old medicine cabinet. She does not know what pills they were. He came to the ER. Did not have any subjective complaints.   In the ER ED physician discussed the case with poison control for accidental overdose of unknown medication. Poison control advised 24-hour monitoring. Patient has stable EKG without any bradycardia, she is symptom free. She  will be monitored for 24 hours. Patient was monitored on telemetry overnight, no significant events on telemetry, no heart blocks, heart rate averaging in the 80s when she was sleeping, and daytime in the 60s to 80s, pressure has been stable, digoxin level on admission has been 0.5, normal TSH level, patient denies any complaints at time of discharge.  1. Accidental overdose of unknown medication. Stable and symptom-free, EKG no acute changes, monitored on telemetry overnight, no significant events, no bradycardia, no block, ,  blood pressure stable through hospital stay.    2. Chronic Atrial fibrillation [place] Vasc 2 is 4 - . We'll resume on home medication on discharge including Cardizem, digoxin, and warfarin   3. Hypothyroidism. TSH within normal limit   4.  Hemorrhoidal lower GI bleed intermittent. Continue Anusol suppository as needed. Outpatient GI follow-up as scheduled with Dr. Deatra Ina Consults   None  Significant Tests:  See full reports for all details    Dg Chest 2 View  04/10/2014   CLINICAL DATA:  81 year old female with productive cough and malaise. Acute bronchitis and bronchospasm. Initial encounter.  EXAM: CHEST  2 VIEW  COMPARISON:  02/13/2014 and earlier.  FINDINGS: Stable lung volumes. Normal cardiac size and mediastinal contours. Visualized tracheal air column is within normal limits. No pneumothorax. No pleural effusion or consolidation. Chronically asymmetric increased markings at the left lung base. No definite acute airspace opacity. No acute osseous abnormality identified.  IMPRESSION: No definite acute cardiopulmonary abnormality. Chronic asymmetric increased markings at the left lung base.   Electronically Signed   By: Lars Pinks M.D.   On: 04/10/2014 08:30     Today   Subjective:   Baxter Kail today has no headache,no chest abdominal pain,no new weakness tingling or numbness, feels much better wants to go home today.   Objective:   Blood pressure 112/57, pulse 86, temperature 97.7 F (36.5 C), temperature source Oral, resp. rate 18, height 5\' 5"  (1.651 m), weight 78.699 kg (173 lb 8 oz), SpO2 99 %.  Intake/Output Summary (Last 24 hours) at 04/27/14 0942 Last data filed at 04/27/14 0400  Gross per 24 hour  Intake 1181.25 ml  Output      0 ml  Net 1181.25 ml    Exam Awake Alert, Oriented *3, No new F.N deficits, Normal affect Superior.AT,PERRAL Supple Neck,No JVD, No cervical lymphadenopathy appriciated.  Symmetrical Chest wall movement, Good air movement bilaterally, CTAB No Gallops,Rubs or new Murmurs, No Parasternal Heave +ve B.Sounds, Abd Soft, Non tender, No organomegaly appriciated, No rebound -guarding or rigidity. No Cyanosis, Clubbing or edema, No new Rash or bruise  Data Review   Cultures -   CBC w  Diff: Lab Results  Component Value Date   WBC 4.8 04/26/2014   WBC 4.9 12/27/2012   HGB 12.2 04/26/2014   HCT 37.0 04/26/2014   PLT 184 04/26/2014   LYMPHOPCT 45.7 04/09/2014   MONOPCT 15.7* 04/09/2014   EOSPCT 6.1* 04/09/2014   BASOPCT 0.6 04/09/2014   CMP: Lab Results  Component Value Date   NA 143 04/27/2014   NA 143 02/23/2013   K 3.9 04/27/2014   CL 109 04/27/2014   CO2 28 04/27/2014   BUN 16 04/27/2014   BUN 21 02/23/2013   CREATININE 0.78 04/27/2014   PROT 7.6 04/09/2014   ALBUMIN 4.1 04/09/2014   BILITOT 0.5 04/09/2014   ALKPHOS 53 04/09/2014   AST 19 04/09/2014   ALT 11 04/09/2014  .  Micro Results No results found for this  or any previous visit (from the past 240 hour(s)).   Discharge Instructions      Follow-up Information    Follow up with [name], MD. Schedule an appointment as soon as possible for a visit in 1 week.   Specialties:  Family Medicine, Radiology   Why:  post hospitalization follow up.   Contact information:   [address] [place] [address] [phone]       Discharge Medications     Medication List    STOP taking these medications        MOVIPREP 100 G Solr  Generic drug:  peg 3350 powder      TAKE these medications        albuterol 108 (90 BASE) MCG/ACT inhaler  Commonly known as:  PROVENTIL HFA;VENTOLIN HFA  Inhale 2 puffs into the lungs every 4 (four) hours as needed for wheezing or shortness of breath.     digoxin 0.125 MG tablet  Commonly known as:  LANOXIN  Take 0.125 mg by mouth daily.     diltiazem 180 MG 24 hr capsule  Commonly known as:  CARDIZEM CD  TAKE 1 CAPSULE (180 MG TOTAL) BY MOUTH DAILY.     hydrocortisone 25 MG suppository  Commonly known as:  ANUSOL-HC  Place 1 suppository (25 mg total) rectally 2 (two) times daily. X 10 days     levothyroxine 25 MCG tablet  Commonly known as:  SYNTHROID, LEVOTHROID  TAKE 1 TABLET (25 MCG TOTAL) BY MOUTH DAILY  BEFORE BREAKFAST.     warfarin 5 MG tablet  Commonly known as:  COUMADIN  Take by mouth as directed         Total Time in preparing paper work, data evaluation and todays exam - 35 minutes  Jenniefer Salak M.D on 04/27/2014 at 9:42 AM  [place]  [phone]

## 2014-04-27 NOTE — Progress Notes (Signed)
ANTICOAGULATION CONSULT NOTE - Initial Consult  Pharmacy Consult for Coumadin Indication: afib  Allergies  Allergen Reactions  . Amiodarone Hcl     REACTION: Intolerance  . Penicillins     REACTION: rash  . Statins     REACTION: rash    Patient Measurements: Height: 5\' 5"  (165.1 cm) Weight: 173 lb 8 oz (78.699 kg) IBW/kg (Calculated) : 57  Vital Signs: Temp: 97.7 F (36.5 C) (02/05 0846) Temp Source: Oral (02/05 0846) BP: 112/57 mmHg (02/05 0933) Pulse Rate: 86 (02/05 0933)  Labs:  Recent Labs  04/24/14 1656 04/26/14 1030 04/26/14 1645 04/27/14 0520  HGB  --  12.2  --   --   HCT  --  37.0  --   --   PLT  --  184  --   --   LABPROT  --   --  29.4*  --   INR 2.8  --  2.76*  --   CREATININE  --  0.76  --  0.78    Estimated Creatinine Clearance: 59.1 mL/min (by C-G formula based on Cr of 0.78).   Medical History: Past Medical History  Diagnosis Date  . TIA (transient ischemic attack) 2010    "left facial numbness after I drank a thermos of iced coffee; not sure it was a TIA"  . Cardiomyopathy     rate-related-resolved  . Atrial fibrillation -persistent     a. s/p PVI [name] 2010;  b. on tikosyn/coumadin;  c. 05/2009 Echo: EF 60-65%, Gr 2 DD.  [name] FH: colonic polyps   . Carotid stenosis     mild (hosp 3/11)- consult by vasc/ Dr Donnetta Hutching  . Pulmonary nodule   . Depression   . Hyperlipidemia   . Syncope and collapse   . 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 Diverticulosis   . Fatty liver   . Tubular adenoma   . Complication of anesthesia     "I have trouble waking up; I'm very sensitive to anesthesia"  . Heart murmur     "prior to valve repair"  . Asthma     "brought out by smoke, air quality"  . Pneumonia 1951    "double"  . Allergic rhinitis     Assessment: 70 YOF on coumadin PTA for afib, admitted to hospital for observation after  accidental overdose of unknown medication yesterday. Pt. Has been stable, likely discharge today. INR 2.76 yesterday evening.  Home dose 5mg  daily.  Goal of Therapy:  INR 2-3 Monitor platelets by anticoagulation protocol: Yes   Plan:  - Continue 5mg  daily per home dose - Daily INR if still here.  Maryanna Shape, PharmD, BCPS  Clinical Pharmacist  Pager: [phone]   04/27/2014,11:36 AM

## 2014-04-27 NOTE — Progress Notes (Signed)
UR completed 

## 2014-04-27 NOTE — Discharge Instructions (Signed)
Follow with Primary MD [name], MD in 7 days   Get CBC, CMP, 2 view Chest X ray checked  by Primary MD next visit.    Activity: As tolerated with Full fall precautions use walker/cane & assistance as needed   Disposition Home    Diet: Heart Healthy  , with feeding assistance and aspiration precautions as needed.  For Heart failure patients - Check your Weight same time everyday, if you gain over 2 pounds, or you develop in leg swelling, experience more shortness of breath or chest pain, call your Primary MD immediately. Follow Cardiac Low Salt Diet and 1.8 lit/day fluid restriction.   On your next visit with your primary care physician please Get Medicines reviewed and adjusted.   Please request your Prim.MD to go over all Hospital Tests and Procedure/Radiological results at the follow up, please get all Hospital records sent to your Prim MD by signing hospital release before you go home.   If you experience worsening of your admission symptoms, develop shortness of breath, life threatening emergency, suicidal or homicidal thoughts you must seek medical attention immediately by calling 911 or calling your MD immediately  if symptoms less severe.  You Must read complete instructions/literature along with all the possible adverse reactions/side effects for all the Medicines you take and that have been prescribed to you. Take any new Medicines after you have completely understood and accpet all the possible adverse reactions/side effects.   Do not drive, operating heavy machinery, perform activities at heights, swimming or participation in water activities or provide baby sitting services if your were admitted for syncope or siezures until you have seen by Primary MD or a Neurologist and advised to do so again.  Do not drive when taking Pain medications.    Do not take more than prescribed Pain, Sleep and Anxiety Medications  Special Instructions: If you have smoked or chewed  Tobacco  in the last 2 yrs please stop smoking, stop any regular Alcohol  and or any Recreational drug use.  Wear Seat belts while driving.   Please note  You were cared for by a hospitalist during your hospital stay. If you have any questions about your discharge medications or the care you received while you were in the hospital after you are discharged, you can call the unit and asked to speak with the hospitalist on call if the hospitalist that took care of you is not available. Once you are discharged, your primary care physician will handle any further medical issues. Please note that NO REFILLS for any discharge medications will be authorized once you are discharged, as it is imperative that you return to your primary care physician (or establish a relationship with a primary care physician if you do not have one) for your aftercare needs so that they can reassess your need for medications and monitor your lab values.

## 2014-04-30 ENCOUNTER — Telehealth: Payer: Self-pay | Admitting: *Deleted

## 2014-04-30 ENCOUNTER — Telehealth: Payer: Self-pay | Admitting: Physician Assistant

## 2014-04-30 ENCOUNTER — Telehealth: Payer: Self-pay

## 2014-04-30 NOTE — Telephone Encounter (Signed)
Spoken to patient and scheduled follow up on 05/02/14

## 2014-04-30 NOTE — Telephone Encounter (Signed)
Left patient a voicemail to call back

## 2014-04-30 NOTE — Telephone Encounter (Signed)
Patient and Pharmacy wanted to let Dr. Caryl Comes know that patient was hospitalized for 24 hrs  She accidentally took 4 of her Diltiazem tablets thinking it was her antibiotic She stated she is out of the hospital now and feels fine

## 2014-04-30 NOTE — Telephone Encounter (Signed)
Pharmacist called, states that pt got her Diltiazem mixed up and was hospitalized, pt thought this med was something different. Please call.

## 2014-04-30 NOTE — Telephone Encounter (Signed)
Advised patient that I am still waiting on a reply from Dr Caryl Comes regarding her blood thinner. I also contacted [facility] who states that they do have patient's moviprep prescription but it is not covered by Medicare and would cost her $120.00. I have given pharmacy a coupon for free Moviprep and patient verbalizes understanding to pick this up.

## 2014-05-02 ENCOUNTER — Encounter: Payer: Self-pay | Admitting: Family Medicine

## 2014-05-02 ENCOUNTER — Ambulatory Visit (INDEPENDENT_AMBULATORY_CARE_PROVIDER_SITE_OTHER): Payer: Medicare Other | Admitting: Family Medicine

## 2014-05-02 VITALS — BP 116/60 | HR 111 | Temp 98.3°F | Ht 65.0 in | Wt 171.8 lb

## 2014-05-02 DIAGNOSIS — Z5181 Encounter for therapeutic drug level monitoring: Secondary | ICD-10-CM | POA: Diagnosis not present

## 2014-05-02 DIAGNOSIS — T50901S Poisoning by unspecified drugs, medicaments and biological substances, accidental (unintentional), sequela: Secondary | ICD-10-CM | POA: Diagnosis not present

## 2014-05-02 LAB — CBC WITH DIFFERENTIAL/PLATELET
BASOS ABS: 0 10*3/uL (ref 0.0–0.1)
BASOS PCT: 0.5 % (ref 0.0–3.0)
EOS ABS: 0.2 10*3/uL (ref 0.0–0.7)
Eosinophils Relative: 3.4 % (ref 0.0–5.0)
HCT: 38.8 % (ref 36.0–46.0)
HEMOGLOBIN: 12.9 g/dL (ref 12.0–15.0)
Lymphocytes Relative: 23.1 % (ref 12.0–46.0)
Lymphs Abs: 1.4 10*3/uL (ref 0.7–4.0)
MCHC: 33.2 g/dL (ref 30.0–36.0)
MCV: 84.2 fl (ref 78.0–100.0)
Monocytes Absolute: 0.6 10*3/uL (ref 0.1–1.0)
Monocytes Relative: 9.5 % (ref 3.0–12.0)
Neutro Abs: 3.8 10*3/uL (ref 1.4–7.7)
Neutrophils Relative %: 63.5 % (ref 43.0–77.0)
Platelets: 176 10*3/uL (ref 150.0–400.0)
RBC: 4.61 Mil/uL (ref 3.87–5.11)
RDW: 15.4 % (ref 11.5–15.5)
WBC: 6.1 10*3/uL (ref 4.0–10.5)

## 2014-05-02 LAB — COMPREHENSIVE METABOLIC PANEL
ALBUMIN: 4.1 g/dL (ref 3.5–5.2)
ALT: 14 U/L (ref 0–35)
AST: 18 U/L (ref 0–37)
Alkaline Phosphatase: 49 U/L (ref 39–117)
BUN: 28 mg/dL — AB (ref 6–23)
CO2: 29 mEq/L (ref 19–32)
Calcium: 9.7 mg/dL (ref 8.4–10.5)
Chloride: 104 mEq/L (ref 96–112)
Creatinine, Ser: 0.76 mg/dL (ref 0.40–1.20)
GFR: 77.92 mL/min (ref >60.00)
GLUCOSE: 88 mg/dL (ref 70–99)
POTASSIUM: 4.5 meq/L (ref 3.5–5.1)
Sodium: 139 mEq/L (ref 135–145)
Total Bilirubin: 0.5 mg/dL (ref 0.2–1.2)
Total Protein: 7.2 g/dL (ref 6.0–8.3)

## 2014-05-02 LAB — PROTIME-INR
INR: 2.7 ratio — AB (ref 0.8–1.0)
Prothrombin Time: 29.4 s — ABNORMAL HIGH (ref 9.6–13.1)

## 2014-05-02 NOTE — Progress Notes (Signed)
Subjective:    Patient ID: [name], female    DOB: 19-Jul-1932, 81 y.o.   MRN: [id number]  HPI Here for f/u of hosp 2/4 to 2/5 for accidental OD of unknown medicine   Took wrong pills by mistake from --- she states the pharmacist at [facility] put wrong pills in the bottle for her dental pre med   cxr ok    Chemistry      Component Value Date/Time   NA 143 04/27/2014 0520   NA 143 02/23/2013 0937   K 3.9 04/27/2014 0520   CL 109 04/27/2014 0520   CO2 28 04/27/2014 0520   BUN 16 04/27/2014 0520   BUN 21 02/23/2013 0937   CREATININE 0.78 04/27/2014 0520      Component Value Date/Time   CALCIUM 8.9 04/27/2014 0520   ALKPHOS 53 04/09/2014 1639   AST 19 04/09/2014 1639   ALT 11 04/09/2014 1639   BILITOT 0.5 04/09/2014 1639     Lab Results  Component Value Date   WBC 4.8 04/26/2014   HGB 12.2 04/26/2014   HCT 37.0 04/26/2014   MCV 85.5 04/26/2014   PLT 184 04/26/2014     Lab Results  Component Value Date   INR 2.76* 04/26/2014   INR 2.8 04/24/2014   INR 2.4 04/09/2014    Watched closely   Her HR went down to 50 in the hospital   HR is up today to 111 (but does not feel palpitations) Feels a bit swollen from all the fluids in the hospital   Feels ok but not back to normal  She has not felt great since she had bronchitis - Dr Caryl Comes did set her up with pulmonary  Just not "quite herself"  More aches and pains and mentally slower  Does not feel like she has had a stroke and does not feel depressed  Perhaps a post viral syndrome   Patient Active Problem List   Diagnosis Date Noted  . Accidental overdose 04/26/2014  . Drug overdose 04/26/2014  . Abdominal muscle pain 04/16/2014  . Acute bronchitis with bronchospasm 04/09/2014  . Blood in stool 04/09/2014  . Viral URI with cough 01/24/2014  . Caregiver stress 08/16/2013  . Colon cancer screening 08/16/2013  . Fatigue 08/16/2013  . Encounter for therapeutic drug monitoring 04/20/2013  . Left ovarian cyst  03/14/2013  . Chest wall contusion 03/14/2013  . (HFpEF) heart failure with preserved ejection fraction 12/27/2012  . Cardiomyopathy, secondary --Resolved again 10/14 10/13/2010  . COLONIC POLYPS, ADENOMATOUS, HX OF 09/18/2009  . ABDOMINAL PAIN 08/27/2009  . PULMONARY NODULE 12/20/2008  . GANGLION CYST 10/04/2007  . OBESITY 06/09/2007  . HYPERLIPIDEMIA 04/27/2007  . DEPRESSION 04/27/2007  . Asthma 04/27/2007  . INSOMNIA 04/27/2007  . ADENOMATOUS COLONIC POLYP 11/04/2006  . Hypothyroidism 09/02/2006  . Atrial fibrillation 08/05/2006   Past Medical History  Diagnosis Date  . TIA (transient ischemic attack) 2010    "left facial numbness after I drank a thermos of iced coffee; not sure it was a TIA"  . Cardiomyopathy     rate-related-resolved  . Atrial fibrillation -persistent     a. s/p PVI [name] 2010;  b. on tikosyn/coumadin;  c. 05/2009 Echo: EF 60-65%, Gr 2 DD.  [name] FH: colonic polyps   . Carotid stenosis     mild (hosp 3/11)- consult by vasc/ Dr Donnetta Hutching  . Pulmonary nodule   . Depression   . Hyperlipidemia   . Syncope and collapse   .  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 Diverticulosis   . Fatty liver   . Tubular adenoma   . Complication of anesthesia     "I have trouble waking up; I'm very sensitive to anesthesia"  . Heart murmur     "prior to valve repair"  . Asthma     "brought out by smoke, air quality"  . Pneumonia 1951    "double"  . Allergic rhinitis    Past Surgical History  Procedure Laterality Date  . Mitral valve annuloplasty  10/23/1998    "Model 4625; Seriel [id number]"; size 23mm; [facility]; Dr. Boyce Medici  . Pilonidal cyst excision  1954  . Exercise stress test  2/07  . Ct of abd and pelvis  10/07    negative  . Carotid doppler  10/07    no stenosis  . Gross hematuria on anticoagulants  10/07    neg urol work up   . Admit- syncope, bradycardia      cardiac and neuro work up  . Cardiac valve replacement    . Av node ablation      "2 @ [name]; 1 @  [place]"  . Appendectomy  1978  . Tubal ligation  1978  . Excisional hemorrhoidectomy  1990's  . Dilation and curettage of uterus     History  Substance Use Topics  . Smoking status: Never Smoker   . Smokeless tobacco: Never Used  . Alcohol Use: 0.0 oz/week    0 Standard drinks or equivalent per week     Comment: 04/26/2014 "occasional glass of wine; a few times/yr"   Family History  Problem Relation Age of Onset  . Hypertension Mother   . Lung cancer Father     smoker  . Alcohol abuse Father   . Cancer Father     bladder and lung CA smoker   Allergies  Allergen Reactions  . Amiodarone Hcl     REACTION: Intolerance  . Penicillins     REACTION: rash  . Statins     REACTION: rash   Current Outpatient Prescriptions on File Prior to Visit  Medication Sig Dispense Refill  . albuterol (PROVENTIL HFA;VENTOLIN HFA) 108 (90 BASE) MCG/ACT inhaler Inhale 2 puffs into the lungs every 4 (four) hours as needed for wheezing or shortness of breath. 1 Inhaler 0  . digoxin (LANOXIN) 0.125 MG tablet Take 0.125 mg by mouth daily.    [name] diltiazem (CARDIZEM CD) 180 MG 24 hr capsule TAKE 1 CAPSULE (180 MG TOTAL) BY MOUTH DAILY. 30 capsule 3  . levothyroxine (SYNTHROID, LEVOTHROID) 25 MCG tablet TAKE 1 TABLET (25 MCG TOTAL) BY MOUTH DAILY BEFORE BREAKFAST. 30 tablet 3  . warfarin (COUMADIN) 5 MG tablet Take by mouth as directed (Patient taking differently: Take 5 mg by mouth daily. ) 90 tablet 1  . hydrocortisone (ANUSOL-HC) 25 MG suppository Place 1 suppository (25 mg total) rectally 2 (two) times daily. X 10 days (Patient not taking: Reported on 05/02/2014) 20 suppository 0   No current facility-administered medications on file prior to visit.      Review of Systems Review of Systems  Constitutional: Negative for fever, appetite change,  and unexpected weight change. pos for generalized fatigue Eyes: Negative for pain and visual disturbance.  Respiratory: Negative for cough and shortness of  breath.   Cardiovascular: Negative for cp or palpitations    Gastrointestinal: Negative for nausea, diarrhea and constipation.  Genitourinary: Negative for urgency and frequency.  Skin: Negative for pallor or rash  Neurological: Negative for weakness, light-headedness, numbness and headaches.  Hematological: Negative for adenopathy. Does not bruise/bleed easily.  Psychiatric/Behavioral: Negative for dysphoric mood. The patient is not nervous/anxious.         Objective:   Physical Exam  Constitutional: She appears well-developed and well-nourished. No distress.  HENT:  Head: Normocephalic and atraumatic.  Mouth/Throat: Oropharynx is clear and moist.  Eyes: Conjunctivae and EOM are normal. Pupils are equal, round, and reactive to light. No scleral icterus.  Neck: Normal range of motion. Neck supple. No JVD present. Carotid bruit is not present.  Cardiovascular: Intact distal pulses.   No murmur heard. HR in low 90s on 2nd check  irreg irreg rhythm  Pulmonary/Chest: Effort normal and breath sounds normal. No respiratory distress. She has no wheezes. She has no rales.  Abdominal: Soft. Bowel sounds are normal. She exhibits no distension. There is no tenderness. There is no rebound.  Musculoskeletal: She exhibits no edema.  Lymphadenopathy:    She has no cervical adenopathy.  Neurological: She is alert. She has normal reflexes. She displays no atrophy and no tremor. No cranial nerve deficit. She exhibits normal muscle tone. Coordination and gait normal.  Skin: Skin is warm and dry. No rash noted. No erythema. No pallor.  Psychiatric: She has a normal mood and affect.          Assessment & Plan:   Problem List Items Addressed This Visit      Other   Drug overdose - Primary    Accidental- the wrong pills were dispensed in her abx bottle (took 4 in prep for a dental procedure) Rev hospital notes/labs and studies in detail  Doing well  cmet and cbc and INR today  Re  Assuring  exam  Pt states incident report was filed at the pharmacy where pills were given        Relevant Orders   Comprehensive metabolic panel (Completed)   CBC with Differential/Platelet (Completed)   Encounter for therapeutic drug monitoring   Relevant Orders   Protime-INR (Completed)

## 2014-05-02 NOTE — Progress Notes (Signed)
Pre visit review using our clinic review tool, if applicable. No additional management support is needed unless otherwise documented below in the visit note. 

## 2014-05-02 NOTE — Patient Instructions (Signed)
Take care of yourself  Labs today  Watch sodium in diet and make sure to drink enough water to help your puffiness

## 2014-05-03 NOTE — Assessment & Plan Note (Signed)
Accidental- the wrong pills were dispensed in her abx bottle (took 4 in prep for a dental procedure) Rev hospital notes/labs and studies in detail  Doing well  cmet and cbc and INR today  Re  Assuring exam  Pt states incident report was filed at the pharmacy where pills were given

## 2014-05-08 ENCOUNTER — Ambulatory Visit (INDEPENDENT_AMBULATORY_CARE_PROVIDER_SITE_OTHER): Payer: Medicare Other | Admitting: Pulmonary Disease

## 2014-05-08 ENCOUNTER — Encounter: Payer: Self-pay | Admitting: Pulmonary Disease

## 2014-05-08 VITALS — BP 130/70 | HR 99 | Temp 97.0°F | Ht 65.5 in | Wt 175.0 lb

## 2014-05-08 DIAGNOSIS — J4520 Mild intermittent asthma, uncomplicated: Secondary | ICD-10-CM

## 2014-05-08 DIAGNOSIS — R05 Cough: Secondary | ICD-10-CM

## 2014-05-08 DIAGNOSIS — R053 Chronic cough: Secondary | ICD-10-CM

## 2014-05-08 NOTE — Progress Notes (Signed)
   Subjective:    Patient ID: [name], female    DOB: 09/14/1932, 81 y.o.   MRN: [id number]  HPI The patient is a 81 year old female who I've been asked to see for a chronic cough. The patient states that she returned from an [place] in November of last year, and was having issues with significant cough productive of large quantities of discolored mucus. She was also having a lot of chest congestion as well. She was treated with multiple rounds of antibiotics, and ultimately a course of prednisone. She had significant improvement in her chest symptoms, but then the character of her cough changed. Since that time, it is primarily dry with very scant mucus, and has been associated with constant throat clearing and an inability to clear mucus from her throat. She describes a classic globus sensation. The cough is clearly made better with antihistamines, but has to take on a consistent basis. The cough is worsened by strong odors and exposure to particulate matter. The patient has significant postnasal drip, and is also blowing clear mucus out of her nose. She denies any sinus pressure or purulence from her nose, and also denies any reflux symptoms. She has had a recent chest x-ray last month that showed very mild increased markings in the left base that are chronic, his CT of her abdomen in 2014 showed no scarring or bronchiectasis on the lower lung cuts. The patient tells me that she has "asthma", but has never had any evaluation for this.   Review of Systems  Constitutional: Negative for fever and unexpected weight change.  HENT: Negative for congestion, dental problem, ear pain, nosebleeds, postnasal drip, rhinorrhea, sinus pressure, sneezing, sore throat and trouble swallowing.   Eyes: Negative for redness and itching.  Respiratory: Positive for cough and shortness of breath. Negative for chest tightness and wheezing.   Cardiovascular: Negative for palpitations and leg swelling.    Gastrointestinal: Negative for nausea and vomiting.  Genitourinary: Negative for dysuria.  Musculoskeletal: Positive for arthralgias. Negative for joint swelling.  Skin: Negative for rash.  Neurological: Negative for headaches.  Hematological: Does not bruise/bleed easily.  Psychiatric/Behavioral: Negative for dysphoric mood. The patient is not nervous/anxious.        Objective:   Physical Exam Constitutional:  Well developed, no acute distress  HENT:  Nares patent without discharge  Oropharynx without exudate, palate and uvula are normal.  +++ mucus collecting in back of throat.  Eyes:  Perrla, eomi, no scleral icterus  Neck:  No JVD, no TMG  Cardiovascular:  Normal rate, regular rhythm, no rubs or gallops.  No murmurs        Intact distal pulses  Pulmonary :  Normal breath sounds, no stridor or respiratory distress   No rales, rhonchi, or wheezing  Abdominal:  Soft, nondistended, bowel sounds present.  No tenderness noted.   Musculoskeletal:  No lower extremity edema noted.  +varicosities  Lymph Nodes:  No cervical lymphadenopathy noted  Skin:  No cyanosis noted  Neurologic:  Alert, appropriate, moves all 4 extremities without obvious deficit.         Assessment & Plan:

## 2014-05-08 NOTE — Patient Instructions (Addendum)
chlorpheniramine 4mg  OTC, and take 2 each night at bedtime.  Can take zyrtec 10mg  during the day if having persistent symptoms of postnasal drip You MUST stop clearing your throat.  The cough will never resolve until you are able to do this.  Would limit voice use as much as possible, and use hard candy (no mint or menthol/cough drops) to bathe back of throat all during the day. Will start on qvar for your airflow limitation that probably represents mild asthma.  Take 2 inhalations each am and pm with spacer.  Rinse mouth well after using. followup with me again in 4 weeks.

## 2014-05-08 NOTE — Assessment & Plan Note (Signed)
The patient has a long-standing chronic cough but I suspect has both upper and lower airway components. She is clearly having severe postnasal drip by exam, and is constantly clearing her throat during my evaluation. I think this is the major contributor to her ongoing cough, and will treat her aggressively with medications for postnasal drip. I have also reviewed the behavioral therapies with her for cyclical coughing and her constant throat clearing. A second component to her cough may be related to asthma. She has mild airflow obstruction on her spirometry today, and will treat her with inhaled corticosteroids to see if this helps. Will use Qvar that has a small particles size and is less irritating to the upper airway, and will use a spacer as well. I would like to see her back in 4 weeks to check on progress.

## 2014-05-09 ENCOUNTER — Telehealth: Payer: Self-pay

## 2014-05-09 NOTE — Telephone Encounter (Signed)
Coumadin can be held as necessary tupicaally 5 days

## 2014-05-09 NOTE — Telephone Encounter (Signed)
I have left a message with nurse asking for Dr Caryl Comes to advise on note below.

## 2014-05-09 NOTE — Telephone Encounter (Signed)
Nurse calling from [facility] wanting anti coag clearance from Dr. Caryl Comes, states she has sent him several messages. Please call.

## 2014-05-09 NOTE — Telephone Encounter (Signed)
[facility] called wanting clearance and anti coag instructions for colonoscopy

## 2014-05-10 NOTE — Telephone Encounter (Signed)
Patient advised of Dr Olin Pia recommendations.

## 2014-05-10 NOTE — Telephone Encounter (Signed)
See 05/09/14 telephone message responded to by Dr Caryl Comes. I have advised patient that per Dr Caryl Comes, she may hold coumadin 5 days prior to her procedure. She verbalizes understanding.

## 2014-05-21 ENCOUNTER — Telehealth: Payer: Self-pay | Admitting: *Deleted

## 2014-05-21 NOTE — Telephone Encounter (Signed)
That is ok 

## 2014-05-21 NOTE — Telephone Encounter (Signed)
[name] with [facility] left a message on voicemail stating that they are changing manufacturers on Warfarin and they are required to notify the physician and get an okay on this. Please advise the pharmacy that this is okay.

## 2014-05-28 NOTE — Telephone Encounter (Signed)
Pharmacy notified as instructed by telephone.

## 2014-05-31 ENCOUNTER — Ambulatory Visit (AMBULATORY_SURGERY_CENTER): Payer: Medicare Other | Admitting: Gastroenterology

## 2014-05-31 ENCOUNTER — Encounter: Payer: Self-pay | Admitting: Gastroenterology

## 2014-05-31 VITALS — BP 130/69 | HR 85 | Temp 97.3°F | Resp 16 | Ht 65.0 in | Wt 175.0 lb

## 2014-05-31 DIAGNOSIS — D124 Benign neoplasm of descending colon: Secondary | ICD-10-CM | POA: Diagnosis not present

## 2014-05-31 DIAGNOSIS — I429 Cardiomyopathy, unspecified: Secondary | ICD-10-CM | POA: Diagnosis not present

## 2014-05-31 DIAGNOSIS — K625 Hemorrhage of anus and rectum: Secondary | ICD-10-CM

## 2014-05-31 DIAGNOSIS — K700 Alcoholic fatty liver: Secondary | ICD-10-CM | POA: Diagnosis not present

## 2014-05-31 DIAGNOSIS — Z86010 Personal history of colonic polyps: Secondary | ICD-10-CM | POA: Diagnosis not present

## 2014-05-31 DIAGNOSIS — R109 Unspecified abdominal pain: Secondary | ICD-10-CM | POA: Diagnosis not present

## 2014-05-31 DIAGNOSIS — E669 Obesity, unspecified: Secondary | ICD-10-CM | POA: Diagnosis not present

## 2014-05-31 DIAGNOSIS — I4891 Unspecified atrial fibrillation: Secondary | ICD-10-CM | POA: Diagnosis not present

## 2014-05-31 DIAGNOSIS — D123 Benign neoplasm of transverse colon: Secondary | ICD-10-CM

## 2014-05-31 DIAGNOSIS — F329 Major depressive disorder, single episode, unspecified: Secondary | ICD-10-CM | POA: Diagnosis not present

## 2014-05-31 DIAGNOSIS — Z8673 Personal history of transient ischemic attack (TIA), and cerebral infarction without residual deficits: Secondary | ICD-10-CM | POA: Diagnosis not present

## 2014-05-31 DIAGNOSIS — K5900 Constipation, unspecified: Secondary | ICD-10-CM | POA: Diagnosis not present

## 2014-05-31 MED ORDER — SODIUM CHLORIDE 0.9 % IV SOLN: 500.0000 mL | INTRAVENOUS | Status: DC

## 2014-05-31 NOTE — Patient Instructions (Addendum)
Resume Coumadin in a.m.  INFORMATION ON POLYPS AND HEMORRHOIDS GIVEN TO YOU TODAY  YOU HAD AN ENDOSCOPIC PROCEDURE TODAY AT [facility]:   Refer to the procedure report that was given to you for any specific questions about what was found during the examination.  If the procedure report does not answer your questions, please call your gastroenterologist to clarify.  If you requested that your care partner not be given the details of your procedure findings, then the procedure report has been included in a sealed envelope for you to review at your convenience later.  YOU SHOULD EXPECT: Some feelings of bloating in the abdomen. Passage of more gas than usual.  Walking can help get rid of the air that was put into your GI tract during the procedure and reduce the bloating. If you had a lower endoscopy (such as a colonoscopy or flexible sigmoidoscopy) you may notice spotting of blood in your stool or on the toilet paper. If you underwent a bowel prep for your procedure, you may not have a normal bowel movement for a few days.  Please Note:  You might notice some irritation and congestion in your nose or some drainage.  This is from the oxygen used during your procedure.  There is no need for concern and it should clear up in a day or so.  SYMPTOMS TO REPORT IMMEDIATELY:   Following lower endoscopy (colonoscopy or flexible sigmoidoscopy):  Excessive amounts of blood in the stool  Significant tenderness or worsening of abdominal pains  Swelling of the abdomen that is new, acute  Fever of 100F or higher   Following upper endoscopy (EGD)  Vomiting of blood or coffee ground material  New chest pain or pain under the shoulder blades  Painful or persistently difficult swallowing  New shortness of breath  Fever of 100F or higher  Black, tarry-looking stools  For urgent or emergent issues, a gastroenterologist can be reached at any hour by calling [phone].   DIET: Your  first meal following the procedure should be a small meal and then it is ok to progress to your normal diet. Heavy or fried foods are harder to digest and may make you feel nauseous or bloated.  Likewise, meals heavy in dairy and vegetables can increase bloating.  Drink plenty of fluids but you should avoid alcoholic beverages for 24 hours.  ACTIVITY:  You should plan to take it easy for the rest of today and you should NOT DRIVE or use heavy machinery until tomorrow (because of the sedation medicines used during the test).    FOLLOW UP: Our staff will call the number listed on your records the next business day following your procedure to check on you and address any questions or concerns that you may have regarding the information given to you following your procedure. If we do not reach you, we will leave a message.  However, if you are feeling well and you are not experiencing any problems, there is no need to return our call.  We will assume that you have returned to your regular daily activities without incident.  If any biopsies were taken you will be contacted by phone or by letter within the next 1-3 weeks.  Please call us at [phone] if you have not heard about the biopsies in 3 weeks.    SIGNATURES/CONFIDENTIALITY: You and/or your care partner have signed paperwork which will be entered into your electronic medical record.  These signatures attest to  the fact that that the information above on your After Visit Summary has been reviewed and is understood.  Full responsibility of the confidentiality of this discharge information lies with you and/or your care-partner.

## 2014-05-31 NOTE — Progress Notes (Signed)
Called to room to assist during endoscopic procedure.  Patient ID and intended procedure confirmed with present staff. Received instructions for my participation in the procedure from the performing physician.  

## 2014-05-31 NOTE — Op Note (Signed)
[place]  [address] [place], [address]   COLONOSCOPY PROCEDURE REPORT  PATIENT: [name], [name]  MR#: [id number] BIRTHDATE: 11-Jun-1932 , 81  yrs. old GENDER: female ENDOSCOPIST: Inda Castle, MD REFERRED BY: PROCEDURE DATE:  05/31/2014 PROCEDURE:   Colonoscopy, diagnostic and Colonoscopy with cold biopsy polypectomy First Screening Colonoscopy - Avg.  risk and is 50 yrs.  old or older - No.  Prior Negative Screening - Now for repeat screening. N/A  History of Adenoma - Now for follow-up colonoscopy & has been > or = to 3 yrs.  Yes hx of adenoma.  Has been 3 or more years since last colonoscopy. ASA CLASS:   Class II INDICATIONS:Evaluation of unexplained GI bleeding and anal bleeding.  MEDICATIONS: Monitored anesthesia care, Propofol 200 mg IV, and Lidocaine 40 mg IV  DESCRIPTION OF PROCEDURE:   After the risks benefits and alternatives of the procedure were thoroughly explained, informed consent was obtained.  The digital rectal exam revealed no abnormalities of the rectum.   The LB [id number]  endoscope was introduced through the anus and advanced to the cecum, which was identified by both the appendix and ileocecal valve. No adverse events experienced.   The quality of the prep was (Suprep was used) good.  The instrument was then slowly withdrawn as the colon was fully examined.      COLON FINDINGS: A sessile polyp measuring 2 mm in size was found in the transverse colon.  A polypectomy was performed with cold forceps.   A sessile polyp measuring 5 mm in size was found in the descending colon.  A polypectomy was performed with a cold snare. The resection was complete, the polyp tissue was completely retrieved and sent to histology.   Internal hemorrhoids were found. Retroflexed views revealed no abnormalities. The time to cecum = 7.2 Withdrawal time = 14.2   The scope was withdrawn and the procedure completed. COMPLICATIONS: Arrhythmia  occurred .  Transient bradycardia to HR 20's treated with atropine 1mg  IV.  Pulse immediately increased to 105.  No hypotension or hypoxemnia was noted.  ENDOSCOPIC IMPRESSION: 1.   Sessile polyp was found in the transverse colon; polypectomy was performed with cold forceps 2.   Sessile polyp was found in the descending colon; polypectomy was performed with a cold snare 3.   Internal hemorrhoids  limited rectal bleeding secondary to hemorrhoids  RECOMMENDATIONS: 1.  Hemorrhoidal suppositories for bleeding 2.  Given your age, you will not need another colonoscopy for colon cancer screening or polyp surveillance.  These types of tests usually stop around the age 26.  eSigned:  Inda Castle, MD 05/31/2014 3:47 PM   cc: [name], MD   PATIENT NAME:  [name], [name] MR#: [id number]

## 2014-05-31 NOTE — Progress Notes (Signed)
Episode when Dr Deatra Ina retroflexed HR immediately dropped to 40-24. IV Atropine 1 mg -  immediately given flushed well -  team aware. Resolved to AF with stable HR  To RR stable awake

## 2014-06-01 ENCOUNTER — Telehealth: Payer: Self-pay | Admitting: *Deleted

## 2014-06-01 NOTE — Telephone Encounter (Signed)
  Follow up Call-  Call back number 05/31/2014  Post procedure Call Back phone  # [phone]  Permission to leave phone message Yes     Patient questions:  Do you have a fever, pain , or abdominal swelling? No. Pain Score  0 *  Have you tolerated food without any problems? Yes.    Have you been able to return to your normal activities? Yes.    Do you have any questions about your discharge instructions: Diet   No. Medications  No. Follow up visit  No.  Do you have questions or concerns about your Care? No.  Actions: * If pain score is 4 or above: No action needed, pain <4.

## 2014-06-07 ENCOUNTER — Other Ambulatory Visit (INDEPENDENT_AMBULATORY_CARE_PROVIDER_SITE_OTHER): Payer: Medicare Other

## 2014-06-07 ENCOUNTER — Telehealth: Payer: Self-pay

## 2014-06-07 ENCOUNTER — Encounter: Payer: Self-pay | Admitting: Gastroenterology

## 2014-06-07 DIAGNOSIS — Z5181 Encounter for therapeutic drug level monitoring: Secondary | ICD-10-CM | POA: Diagnosis not present

## 2014-06-07 LAB — POCT INR: INR: 1.7

## 2014-06-07 MED ORDER — WARFARIN SODIUM 5 MG PO TABS: ORAL_TABLET | ORAL | Status: DC

## 2014-06-07 NOTE — Telephone Encounter (Signed)
-----   Message from Abner Greenspan, MD sent at 06/07/2014  2:37 PM EDT ----- Increase dose to 5 mg daily with 7.5 mg on thursdays and sundays Re check in a week please

## 2014-06-07 NOTE — Telephone Encounter (Signed)
Left message for patient to call office regarding her coumadin dosing.

## 2014-06-12 ENCOUNTER — Ambulatory Visit (INDEPENDENT_AMBULATORY_CARE_PROVIDER_SITE_OTHER): Payer: Medicare Other | Admitting: Pulmonary Disease

## 2014-06-12 ENCOUNTER — Encounter: Payer: Self-pay | Admitting: Pulmonary Disease

## 2014-06-12 VITALS — BP 110/62 | HR 75 | Temp 97.2°F | Ht 65.5 in | Wt 175.4 lb

## 2014-06-12 DIAGNOSIS — J4520 Mild intermittent asthma, uncomplicated: Secondary | ICD-10-CM

## 2014-06-12 NOTE — Assessment & Plan Note (Signed)
The patient has had total resolution of her cough with treatment of her airflow limitation and more aggressive treatment of her postnasal drip. I have asked her to continue on her inhaled corticosteroids, but will decrease to once a day dosing to see how she does.  I stressed to her the importance of airway inflammation control, in order to prevent progressive airflow obstruction, and to prevent complications when she has a URI.

## 2014-06-12 NOTE — Patient Instructions (Signed)
Lets change your qvar to the higher strength (80), and use 2 inhalations each am only with spacer to see how you do.  We can call in prescription for the higher strength as well Continue your antihistamine as needed, but can decrease the dose if you are doing better. followup with me again in 65mos.

## 2014-06-12 NOTE — Progress Notes (Signed)
   Subjective:    Patient ID: [name], female    DOB: 1932-08-08, 81 y.o.   MRN: [id number]  HPI The patient comes in today for follow-up of her chronic cough. She was found to have mild airflow limitation, and her cough was felt secondary to asthma and also postnasal drip. Started on Qvar, as well as more aggressive treatment of her allergic rhinitis. She comes in today where her cough has totally resolved, but she still has some postnasal drip with hoarseness. She is having no issues with the Qvar.   Review of Systems  Constitutional: Negative for fever, chills and unexpected weight change.  HENT: Positive for postnasal drip. Negative for congestion, dental problem, ear pain, nosebleeds, rhinorrhea, sinus pressure, sneezing, sore throat, trouble swallowing and voice change.   Eyes: Negative for redness, itching and visual disturbance.  Respiratory: Positive for cough and shortness of breath. Negative for choking, chest tightness and wheezing.   Cardiovascular: Negative for chest pain, palpitations and leg swelling.  Gastrointestinal: Negative for nausea, vomiting, abdominal pain and diarrhea.  Genitourinary: Negative for dysuria and difficulty urinating.  Musculoskeletal: Negative for joint swelling and arthralgias.  Skin: Negative for rash.  Neurological: Negative for tremors, syncope and headaches.  Hematological: Does not bruise/bleed easily.  Psychiatric/Behavioral: Negative for dysphoric mood. The patient is not nervous/anxious.        Objective:   Physical Exam Well-developed female in no acute distress Nose without purulence or discharge noted Neck without lymphadenopathy or thyromegaly Mild hoarseness noted Chest totally clear to auscultation, no wheezing Cardiac exam with regular rate and rhythm Lower extremities without edema, no cyanosis Alert and oriented, moves all 4 extremities.       Assessment & Plan:

## 2014-06-14 ENCOUNTER — Other Ambulatory Visit (INDEPENDENT_AMBULATORY_CARE_PROVIDER_SITE_OTHER): Payer: Medicare Other

## 2014-06-14 DIAGNOSIS — Z5181 Encounter for therapeutic drug level monitoring: Secondary | ICD-10-CM

## 2014-06-14 LAB — POCT INR: INR: 4.9

## 2014-06-18 ENCOUNTER — Telehealth: Payer: Self-pay | Admitting: Gastroenterology

## 2014-06-18 NOTE — Telephone Encounter (Signed)
Verified for the patient what the polys actually were, what to watch for as far as any concerning changes in bowel habits. She is unable to put roughage in her diet as much as she would like to due to her Coumadin. She is planning on using Senna for bowel movements.

## 2014-06-21 ENCOUNTER — Other Ambulatory Visit (INDEPENDENT_AMBULATORY_CARE_PROVIDER_SITE_OTHER): Payer: Medicare Other

## 2014-06-21 DIAGNOSIS — Z5181 Encounter for therapeutic drug level monitoring: Secondary | ICD-10-CM | POA: Diagnosis not present

## 2014-06-21 LAB — POCT INR: INR: 4.3

## 2014-07-02 ENCOUNTER — Other Ambulatory Visit (INDEPENDENT_AMBULATORY_CARE_PROVIDER_SITE_OTHER): Payer: Medicare Other

## 2014-07-02 DIAGNOSIS — Z5181 Encounter for therapeutic drug level monitoring: Secondary | ICD-10-CM | POA: Diagnosis not present

## 2014-07-02 LAB — POCT INR: INR: 2.3

## 2014-07-03 ENCOUNTER — Telehealth: Payer: Self-pay

## 2014-07-03 NOTE — Telephone Encounter (Signed)
Patient aware of PT/INR results and recommendations.

## 2014-07-03 NOTE — Telephone Encounter (Signed)
-----   Message from Abner Greenspan, MD sent at 07/02/2014  9:17 PM EDT ----- Continue current dose of 5 mg daily Re check 1 mo please

## 2014-07-11 ENCOUNTER — Other Ambulatory Visit: Payer: Self-pay | Admitting: Internal Medicine

## 2014-08-01 ENCOUNTER — Telehealth: Payer: Self-pay | Admitting: Family Medicine

## 2014-08-01 ENCOUNTER — Telehealth: Payer: Self-pay

## 2014-08-01 NOTE — Telephone Encounter (Signed)
Pt walked in;for some time has had knee and hip pain that has worsened lately(pt could not be specific with time frame),at times feels like knee will give way; pt also having swelling in lt ankle for 2 days; tylenol has not been helping the pain and pt has been taking ibuprofen. Pt does not have CP,SOB,dizziness or h/a. No known injury. Pt said called 2 or 3 weeks ago but do not see note in chart; offered pt appt this afternoon but pt cannot stay due to work; pt scheduled appt 08/02/14 at 7:15 am. If pt condition changes or worsens prior to appt pt will go to UC.

## 2014-08-01 NOTE — Telephone Encounter (Signed)
Opened in error made appointment with [facility] 5/12

## 2014-08-02 ENCOUNTER — Telehealth: Payer: Self-pay | Admitting: *Deleted

## 2014-08-02 ENCOUNTER — Encounter: Payer: Self-pay | Admitting: Primary Care

## 2014-08-02 ENCOUNTER — Ambulatory Visit (INDEPENDENT_AMBULATORY_CARE_PROVIDER_SITE_OTHER): Payer: Medicare Other | Admitting: Primary Care

## 2014-08-02 ENCOUNTER — Ambulatory Visit (INDEPENDENT_AMBULATORY_CARE_PROVIDER_SITE_OTHER)
Admission: RE | Admit: 2014-08-02 | Discharge: 2014-08-02 | Disposition: A | Discharge status: Home / Self Care | Payer: Medicare Other | Source: Ambulatory Visit | Attending: Primary Care | Admitting: Primary Care

## 2014-08-02 VITALS — BP 120/74 | HR 100 | Temp 98.3°F | Ht 65.5 in | Wt 172.4 lb

## 2014-08-02 DIAGNOSIS — M25562 Pain in left knee: Secondary | ICD-10-CM

## 2014-08-02 DIAGNOSIS — M25552 Pain in left hip: Secondary | ICD-10-CM

## 2014-08-02 DIAGNOSIS — M25559 Pain in unspecified hip: Secondary | ICD-10-CM | POA: Insufficient documentation

## 2014-08-02 DIAGNOSIS — M1612 Unilateral primary osteoarthritis, left hip: Secondary | ICD-10-CM | POA: Diagnosis not present

## 2014-08-02 DIAGNOSIS — E039 Hypothyroidism, unspecified: Secondary | ICD-10-CM

## 2014-08-02 DIAGNOSIS — M1712 Unilateral primary osteoarthritis, left knee: Secondary | ICD-10-CM | POA: Diagnosis not present

## 2014-08-02 LAB — TSH: TSH: 4.11 u[IU]/mL (ref 0.35–4.50)

## 2014-08-02 NOTE — Progress Notes (Signed)
Pre visit review using our clinic review tool, if applicable. No additional management support is needed unless otherwise documented below in the visit note. 

## 2014-08-02 NOTE — Progress Notes (Signed)
Subjective:    Patient ID: [name], female    DOB: [birth year], 81 y.o.   MRN: [id number]  HPI  [name] is a 81 year old female who presents today with a chief complaint of left hip pain. She's had chronic hip pain for several years but is reporting increased pain to her left hip and also "giving" out of her left knee over the past 2 weeks. She would like a referral to a chiropractor for evaluation. She once saw a chiropractor in another state prior to moving to [place] who was able to help her with her pain. She is taking tylenol PRN and has started taking ibuprofen.   2) Lower extremity edema: She also reports swelling to her ankles over the past several days. She's been elevating her feet at night which as helped. Denies being stagnant over the past several days, denies increase in activity with walking. Denies chest pain, shortness of breath.   3) Hypothyroidism: She would also like her TSH re-checked so she can come off of her Levothyroxine. She was placed on the Levothyroxine less than one year ago for lethargy. Her last TSH was 3 months ago and was normal. Prior to that TSH was elevated. Denies palpitations, weakness.  Review of Systems  Respiratory: Negative for shortness of breath.   Cardiovascular: Positive for leg swelling. Negative for chest pain and palpitations.  Genitourinary: Negative for decreased urine volume and difficulty urinating.  Musculoskeletal: Positive for arthralgias.  Neurological: Negative for dizziness and weakness.       Past Medical History  Diagnosis Date  . TIA (transient ischemic attack) 2010    "left facial numbness after I drank a thermos of iced coffee; not sure it was a TIA"  . Cardiomyopathy     rate-related-resolved  . Atrial fibrillation -persistent     a. s/p PVI [name] 2010;  b. on tikosyn/coumadin;  c. 05/2009 Echo: EF 60-65%, Gr 2 DD.  [name] FH: colonic polyps   . Carotid stenosis     mild (hosp 3/11)- consult by vasc/ Dr Donnetta Hutching  . Pulmonary  nodule   . Depression   . Hyperlipidemia   . Syncope and collapse   . 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 Diverticulosis   . Fatty liver   . Tubular adenoma   . Complication of anesthesia     "I have trouble waking up; I'm very sensitive to anesthesia"  . Heart murmur     "prior to valve repair"  . Asthma     "brought out by smoke, air quality"  . Pneumonia 1951    "double"  . Allergic rhinitis     History   Social History  . Marital Status: Widowed    Spouse Name: N/A  . Number of Children: 6  . Years of Education: N/A   Occupational History  . realtor    Social History Main Topics  . Smoking status: Never Smoker   . Smokeless tobacco: Never Used  . Alcohol Use: 0.0 oz/week    0 Standard drinks or equivalent per week     Comment: 04/26/2014 "occasional glass of wine; a few times/yr"  . Drug Use: No  . Sexual Activity: No   Other Topics Concern  . Not on file   Social History Narrative   Retired. Daily Caffeine use: 2 daily     Past Surgical History  Procedure Laterality Date  . Mitral valve annuloplasty  10/23/1998    "Model 4625; Seriel [id number]";  size 48mm; [facility]; Dr. Boyce Medici  . Pilonidal cyst excision  1954  . Exercise stress test  2/07  . Ct of abd and pelvis  10/07    negative  . Carotid doppler  10/07    no stenosis  . Gross hematuria on anticoagulants  10/07    neg urol work up   . Admit- syncope, bradycardia      cardiac and neuro work up  . Av node ablation      "2 @ [name]; 1 @ [place]"  . Appendectomy  1978  . Tubal ligation  1978  . Excisional hemorrhoidectomy  1990's  . Dilation and curettage of uterus      Family History  Problem Relation Age of Onset  . Hypertension Mother   . Lung cancer Father     smoker  . Alcohol abuse Father   . Cancer Father     bladder and lung CA smoker    Allergies  Allergen Reactions  . Amiodarone Hcl     REACTION: Intolerance  . Penicillins     REACTION: rash  . Statins     REACTION: rash     Current Outpatient Prescriptions on File Prior to Visit  Medication Sig Dispense Refill  . albuterol (PROVENTIL HFA;VENTOLIN HFA) 108 (90 BASE) MCG/ACT inhaler Inhale 2 puffs into the lungs every 4 (four) hours as needed for wheezing or shortness of breath. 1 Inhaler 0  . beclomethasone (QVAR) 40 MCG/ACT inhaler Inhale 2 puffs into the lungs as needed.    . chlorpheniramine (CHLOR-TRIMETON) 4 MG tablet Take 4 mg by mouth at bedtime.    . digoxin (LANOXIN) 0.125 MG tablet Take 0.125 mg by mouth daily.    [name] diltiazem (CARDIZEM CD) 180 MG 24 hr capsule TAKE 1 CAPSULE (180 MG TOTAL) BY MOUTH DAILY. 30 capsule 3  . levothyroxine (SYNTHROID, LEVOTHROID) 25 MCG tablet TAKE 1 TABLET (25 MCG TOTAL) BY MOUTH DAILY BEFORE BREAKFAST. 30 tablet 3  . warfarin (COUMADIN) 5 MG tablet Take by mouth or as directed 90 tablet 1   No current facility-administered medications on file prior to visit.    BP 120/74 mmHg  Pulse 100  Temp(Src) 98.3 F (36.8 C) (Oral)  Ht 5' 5.5" (1.664 m)  Wt 172 lb 6.4 oz (78.2 kg)  BMI 28.24 kg/m2  SpO2 98%    Objective:   Physical Exam  Constitutional: She is oriented to person, place, and time. She appears well-developed.  Neck: No thyromegaly present.  Cardiovascular: Normal rate, regular rhythm and normal heart sounds.   Pulmonary/Chest: Effort normal and breath sounds normal.  No lower extremity edema noted upon exam.  Musculoskeletal: Normal range of motion. She exhibits no edema or tenderness.  Neurological: She is alert and oriented to person, place, and time.  Skin: Skin is warm and dry.  Psychiatric: She has a normal mood and affect.          Assessment & Plan:

## 2014-08-02 NOTE — Assessment & Plan Note (Signed)
Requesting repeat TSH. Would like to discuss coming off levothyroxine if normal. Will check today. Will consult with PCP regarding her request.

## 2014-08-02 NOTE — Patient Instructions (Signed)
Complete lab work prior to leaving today. I will notify you of your results. Complete xray(s) prior to leaving today. I will contact you regarding your results. You will be contacted regarding your referral to a Chiropractor.  Please let us know if you have not heard back within one week.  Do not take ibuprofen. Try extra strength tylenol, applying ice, and do not stay stagnant for prolonged periods of time. Take care and have a safe trip!

## 2014-08-02 NOTE — Telephone Encounter (Signed)
No answer x 2. No voicemail available to leave message. Will try again tomorrow.

## 2014-08-02 NOTE — Telephone Encounter (Signed)
Pt was seen today by Anda Kraft, and was sent over for labs and xrays. While performing her xray she remembered that she forgot to mention a tick bite she received 3 weeks ago. The bite was located in her right groin area right where the crease of her underwear was, and appeared to possibly be  irritated by the elastic of her underwear The area of the bite was pinkish in color about 10 mm in size with no defined shape. There was no redness or pustules, pt also denied running a fever. She was wondering if the bite could be contributing to her left sided hip and knee pain, she had googled lyme disease. I spoke with Anda Kraft and she was willing to work her back in, but pt couldn't wait and declined stating she was driving out of town today and couldn't wait.

## 2014-08-02 NOTE — Telephone Encounter (Signed)
Attempted to call patient x 1. No answer, no voicemail set up. Will try again later.

## 2014-08-02 NOTE — Assessment & Plan Note (Signed)
Chronic, recent "giving away" Suspect arthritis, will do xray today. Tylenol for pain. Referral made to chiropractor per her request.

## 2014-08-02 NOTE — Assessment & Plan Note (Addendum)
Chronic, worsening over past 2 weeks. Xrays today. Stop ibuprofen. Try extra strength tylenol. Good ROM. She is on her way out of town today. Referral made to chiropractor per her request.

## 2014-08-03 NOTE — Telephone Encounter (Signed)
Spoke with patient today regarding results and referral. She will call back Monday to set up her referral for the Chiropractor.

## 2014-08-03 NOTE — Telephone Encounter (Signed)
No answer today, no voicemail set up, will call again Monday.

## 2014-08-16 DIAGNOSIS — M9903 Segmental and somatic dysfunction of lumbar region: Secondary | ICD-10-CM | POA: Diagnosis not present

## 2014-08-16 DIAGNOSIS — M955 Acquired deformity of pelvis: Secondary | ICD-10-CM | POA: Diagnosis not present

## 2014-08-16 DIAGNOSIS — M9905 Segmental and somatic dysfunction of pelvic region: Secondary | ICD-10-CM | POA: Diagnosis not present

## 2014-08-16 DIAGNOSIS — M5416 Radiculopathy, lumbar region: Secondary | ICD-10-CM | POA: Diagnosis not present

## 2014-08-17 DIAGNOSIS — M5416 Radiculopathy, lumbar region: Secondary | ICD-10-CM | POA: Diagnosis not present

## 2014-08-17 DIAGNOSIS — M955 Acquired deformity of pelvis: Secondary | ICD-10-CM | POA: Diagnosis not present

## 2014-08-17 DIAGNOSIS — M9905 Segmental and somatic dysfunction of pelvic region: Secondary | ICD-10-CM | POA: Diagnosis not present

## 2014-08-17 DIAGNOSIS — M9903 Segmental and somatic dysfunction of lumbar region: Secondary | ICD-10-CM | POA: Diagnosis not present

## 2014-08-21 ENCOUNTER — Telehealth: Payer: Self-pay | Admitting: Family Medicine

## 2014-08-21 NOTE — Telephone Encounter (Signed)
Patient Name: [name] DOB: 21-Jun-1932 Initial Comment Caller States she had a tick bite 6 wks ago, thinks she was able to remove the tick fully. has been itchy, about dime sized, formed a head on it, has random bumps appearing around it. Nurse Assessment Nurse: Vallery Sa, RN, Cathy Date/Time (Eastern Time): 08/21/2014 9:48:52 AM Confirm and document reason for call. If symptomatic, describe symptoms. ---[name], red and swollen. No fever. She has had chills the past few days. Has the patient traveled out of the country within the last 30 days? ---No Does the patient require triage? ---Yes Related visit to physician within the last 2 weeks? ---No Does the PT have any chronic conditions? (i.e. diabetes, asthma, etc.) ---Yes List chronic conditions. ---Heart Arrhythmia Guidelines Guideline Title Affirmed Question Affirmed Notes Tick Bite [1] Red or very tender (to touch) area AND [2] started over 24 hours after the bite Final Disposition User See Physician within [place], RN, [place] Comments Scheduled appointment for 08/22/14 at 10:15am with Dr. Glori Bickers.

## 2014-08-21 NOTE — Telephone Encounter (Signed)
Pt has appt 08/22/14 at 10:15 with Dr Glori Bickers.

## 2014-08-21 NOTE — Telephone Encounter (Signed)
I will see her then  

## 2014-08-22 ENCOUNTER — Ambulatory Visit (INDEPENDENT_AMBULATORY_CARE_PROVIDER_SITE_OTHER): Payer: Medicare Other | Admitting: Family Medicine

## 2014-08-22 ENCOUNTER — Encounter: Payer: Self-pay | Admitting: Family Medicine

## 2014-08-22 VITALS — BP 128/80 | HR 80 | Temp 97.8°F | Ht 65.5 in | Wt 170.8 lb

## 2014-08-22 DIAGNOSIS — M9903 Segmental and somatic dysfunction of lumbar region: Secondary | ICD-10-CM | POA: Diagnosis not present

## 2014-08-22 DIAGNOSIS — T148 Other injury of unspecified body region: Secondary | ICD-10-CM | POA: Diagnosis not present

## 2014-08-22 DIAGNOSIS — M9905 Segmental and somatic dysfunction of pelvic region: Secondary | ICD-10-CM | POA: Diagnosis not present

## 2014-08-22 DIAGNOSIS — L509 Urticaria, unspecified: Secondary | ICD-10-CM | POA: Diagnosis not present

## 2014-08-22 DIAGNOSIS — M955 Acquired deformity of pelvis: Secondary | ICD-10-CM | POA: Diagnosis not present

## 2014-08-22 DIAGNOSIS — W57XXXA Bitten or stung by nonvenomous insect and other nonvenomous arthropods, initial encounter: Secondary | ICD-10-CM | POA: Diagnosis not present

## 2014-08-22 DIAGNOSIS — M5416 Radiculopathy, lumbar region: Secondary | ICD-10-CM | POA: Diagnosis not present

## 2014-08-22 MED ORDER — MOMETASONE FUROATE 0.1 % EX CREA: 1.0000 "application " | TOPICAL_CREAM | Freq: Every day | CUTANEOUS | Status: DC

## 2014-08-22 NOTE — Assessment & Plan Note (Signed)
R hip ==weeks ago  Indurated/ erythema -no bullseye  Some hives/no other rash Some malaise but no fever  Elocon cream to bite site Tick lab today  Watch for fever or other symptoms

## 2014-08-22 NOTE — Patient Instructions (Signed)
Try claritin 10 mg daily for itch and hives  Stay cool Tick labs today  If fever or other new symptoms please let me know  If a different kind of rash develops of if the tick bite looks like a bullseye let me know  Try elocon cream for tick bite/itchy area

## 2014-08-22 NOTE — Assessment & Plan Note (Signed)
Unsure cause  Rev exp  Allergens/heat/cold Doubt rel to tick bite Trial of claritin  Update

## 2014-08-22 NOTE — Progress Notes (Signed)
Subjective:    Patient ID: [name], female    DOB: May 09, 1932, 81 y.o.   MRN: [id number]  HPI Here for symptoms after a tick bite   Bite occurred 6-8 weeks ago - bite was in crease of leg R,  Tick was flat (not engorged)  Used vic's  , took out with tweezers and cleaned wound with peroxide  Medium size tick (bigger than a pin head) Itchy-site is still itchy  Now she has developed tingling in scalp (like a goosbump feeling)- then all over  Whelps on and off - arms/trunk/underwear area (come and go) Worse with heat  Nothing on palms or soles   No fever  Little ST on right side No nausea   (has been watching kids who have been sick)   Patient Active Problem List   Diagnosis Date Noted  . Hip pain 08/02/2014  . Left knee pain 08/02/2014  . Chronic cough 05/08/2014  . Accidental overdose 04/26/2014  . Drug overdose 04/26/2014  . Abdominal muscle pain 04/16/2014  . Acute bronchitis with bronchospasm 04/09/2014  . Blood in stool 04/09/2014  . Viral URI with cough 01/24/2014  . Caregiver stress 08/16/2013  . Colon cancer screening 08/16/2013  . Fatigue 08/16/2013  . Encounter for therapeutic drug monitoring 04/20/2013  . Left ovarian cyst 03/14/2013  . Chest wall contusion 03/14/2013  . (HFpEF) heart failure with preserved ejection fraction 12/27/2012  . Cardiomyopathy, secondary --Resolved again 10/14 10/13/2010  . COLONIC POLYPS, ADENOMATOUS, HX OF 09/18/2009  . ABDOMINAL PAIN 08/27/2009  . PULMONARY NODULE 12/20/2008  . GANGLION CYST 10/04/2007  . OBESITY 06/09/2007  . HYPERLIPIDEMIA 04/27/2007  . DEPRESSION 04/27/2007  . Asthma, mild intermittent 04/27/2007  . INSOMNIA 04/27/2007  . ADENOMATOUS COLONIC POLYP 11/04/2006  . Hypothyroidism 09/02/2006  . Atrial fibrillation 08/05/2006   Past Medical History  Diagnosis Date  . TIA (transient ischemic attack) 2010    "left facial numbness after I drank a thermos of iced coffee; not sure it was a TIA"  .  Cardiomyopathy     rate-related-resolved  . Atrial fibrillation -persistent     a. s/p PVI [name] 2010;  b. on tikosyn/coumadin;  c. 05/2009 Echo: EF 60-65%, Gr 2 DD.  [name] FH: colonic polyps   . Carotid stenosis     mild (hosp 3/11)- consult by vasc/ Dr Donnetta Hutching  . Pulmonary nodule   . Depression   . Hyperlipidemia   . Syncope and collapse   . 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 Diverticulosis   . Fatty liver   . Tubular adenoma   . Complication of anesthesia     "I have trouble waking up; I'm very sensitive to anesthesia"  . Heart murmur     "prior to valve repair"  . Asthma     "brought out by smoke, air quality"  . Pneumonia 1951    "double"  . Allergic rhinitis    Past Surgical History  Procedure Laterality Date  . Mitral valve annuloplasty  10/23/1998    "Model 4625; Seriel [id number]"; size 15mm; [facility]; Dr. Boyce Medici  . Pilonidal cyst excision  1954  . Exercise stress test  2/07  . Ct of abd and pelvis  10/07    negative  . Carotid doppler  10/07    no stenosis  . Gross hematuria on anticoagulants  10/07    neg urol work up   . Admit- syncope, bradycardia      cardiac and neuro work up  .  Av node ablation      "2 @ [name]; 1 @ [place]"  . Appendectomy  1978  . Tubal ligation  1978  . Excisional hemorrhoidectomy  1990's  . Dilation and curettage of uterus     History  Substance Use Topics  . Smoking status: Never Smoker   . Smokeless tobacco: Never Used  . Alcohol Use: 0.0 oz/week    0 Standard drinks or equivalent per week     Comment: 04/26/2014 "occasional glass of wine; a few times/yr"   Family History  Problem Relation Age of Onset  . Hypertension Mother   . Lung cancer Father     smoker  . Alcohol abuse Father   . Cancer Father     bladder and lung CA smoker   Allergies  Allergen Reactions  . Amiodarone Hcl     REACTION: Intolerance  . Penicillins     REACTION: rash  . Statins     REACTION: rash   Current Outpatient Prescriptions on File Prior  to Visit  Medication Sig Dispense Refill  . albuterol (PROVENTIL HFA;VENTOLIN HFA) 108 (90 BASE) MCG/ACT inhaler Inhale 2 puffs into the lungs every 4 (four) hours as needed for wheezing or shortness of breath. 1 Inhaler 0  . beclomethasone (QVAR) 40 MCG/ACT inhaler Inhale 2 puffs into the lungs as needed.    . chlorpheniramine (CHLOR-TRIMETON) 4 MG tablet Take 4 mg by mouth at bedtime.    . digoxin (LANOXIN) 0.125 MG tablet Take 0.125 mg by mouth daily.    [name] diltiazem (CARDIZEM CD) 180 MG 24 hr capsule TAKE 1 CAPSULE (180 MG TOTAL) BY MOUTH DAILY. 30 capsule 3  . levothyroxine (SYNTHROID, LEVOTHROID) 25 MCG tablet TAKE 1 TABLET (25 MCG TOTAL) BY MOUTH DAILY BEFORE BREAKFAST. 30 tablet 3  . warfarin (COUMADIN) 5 MG tablet Take by mouth or as directed 90 tablet 1   No current facility-administered medications on file prior to visit.        Review of Systems Review of Systems  Constitutional: Negative for fever, appetite change, fatigue and unexpected weight change.  Eyes: Negative for pain and visual disturbance.  Respiratory: Negative for cough and shortness of breath.   Cardiovascular: Negative for cp or palpitations    Gastrointestinal: Negative for nausea, diarrhea and constipation.  Genitourinary: Negative for urgency and frequency.  Skin: Negative for pallor and pos for rash /itchy , pos for tick bite  Neurological: Negative for weakness, light-headedness, numbness and headaches.  Hematological: Negative for adenopathy. Does not bruise/bleed easily.  Psychiatric/Behavioral: Negative for dysphoric mood. The patient is not nervous/anxious.         Objective:   Physical Exam  Constitutional: She appears well-developed and well-nourished. No distress.  HENT:  Head: Normocephalic and atraumatic.  Mouth/Throat: Oropharynx is clear and moist.  Eyes: Conjunctivae and EOM are normal. Pupils are equal, round, and reactive to light. Right eye exhibits no discharge. Left eye exhibits no  discharge.  Neck: Normal range of motion. Neck supple.  Cardiovascular: Normal rate and regular rhythm.   Pulmonary/Chest: Effort normal and breath sounds normal. No respiratory distress. She has no wheezes. She has no rales.  Abdominal: Soft. Bowel sounds are normal. She exhibits no distension. There is no tenderness.  Musculoskeletal: She exhibits no edema.  Lymphadenopathy:    She has no cervical adenopathy.  Neurological: She is alert. She has normal reflexes.  Skin: Skin is warm and dry. Rash noted.     Psychiatric: She has a normal  mood and affect.          Assessment & Plan:   Problem List Items Addressed This Visit    Tick bite - Primary    R hip ==weeks ago  Indurated/ erythema -no bullseye  Some hives/no other rash Some malaise but no fever  Elocon cream to bite site Tick lab today  Watch for fever or other symptoms       Relevant Orders   B. Burgdorfi Antibodies (Completed)   Rocky mtn spotted fvr ab, IgG-blood (Completed)   Rocky mtn spotted fvr ab, IgM-blood (Completed)   Urticaria    Unsure cause  Rev exp  Allergens/heat/cold Doubt rel to tick bite Trial of claritin  Update

## 2014-08-22 NOTE — Progress Notes (Signed)
Pre visit review using our clinic review tool, if applicable. No additional management support is needed unless otherwise documented below in the visit note. 

## 2014-08-23 ENCOUNTER — Other Ambulatory Visit: Payer: Self-pay | Admitting: Family Medicine

## 2014-08-23 DIAGNOSIS — M955 Acquired deformity of pelvis: Secondary | ICD-10-CM | POA: Diagnosis not present

## 2014-08-23 DIAGNOSIS — M5416 Radiculopathy, lumbar region: Secondary | ICD-10-CM | POA: Diagnosis not present

## 2014-08-23 DIAGNOSIS — M9903 Segmental and somatic dysfunction of lumbar region: Secondary | ICD-10-CM | POA: Diagnosis not present

## 2014-08-23 DIAGNOSIS — M9905 Segmental and somatic dysfunction of pelvic region: Secondary | ICD-10-CM | POA: Diagnosis not present

## 2014-08-23 LAB — ROCKY MTN SPOTTED FVR AB, IGG-BLOOD: RMSF IgG: 0.28 IV

## 2014-08-23 LAB — ROCKY MTN SPOTTED FVR AB, IGM-BLOOD: ROCKY MTN SPOTTED FEVER, IGM: 0.14 IV

## 2014-08-23 LAB — B. BURGDORFI ANTIBODIES: B burgdorferi Ab IgG+IgM: 0 {ISR}

## 2014-08-23 NOTE — Telephone Encounter (Signed)
Please refill for a year  

## 2014-08-23 NOTE — Telephone Encounter (Signed)
Patient had appointment with Gentry Fitz, NP on 08/02/14.  Discussion of coming off of levothyroxine at that time.  TSH was normal at that visit.  Okay to refill?  Please advise.

## 2014-08-24 ENCOUNTER — Telehealth: Payer: Self-pay | Admitting: Family Medicine

## 2014-08-24 DIAGNOSIS — M9905 Segmental and somatic dysfunction of pelvic region: Secondary | ICD-10-CM | POA: Diagnosis not present

## 2014-08-24 DIAGNOSIS — M5416 Radiculopathy, lumbar region: Secondary | ICD-10-CM | POA: Diagnosis not present

## 2014-08-24 DIAGNOSIS — M955 Acquired deformity of pelvis: Secondary | ICD-10-CM | POA: Diagnosis not present

## 2014-08-24 DIAGNOSIS — M9903 Segmental and somatic dysfunction of lumbar region: Secondary | ICD-10-CM | POA: Diagnosis not present

## 2014-08-24 MED ORDER — DOXYCYCLINE HYCLATE 100 MG PO TABS
100.0000 mg | ORAL_TABLET | Freq: Two times a day (BID) | ORAL | Status: DC
Start: 2014-08-24 — End: 2014-09-17

## 2014-08-24 NOTE — Telephone Encounter (Signed)
I doubt that the tingling or hives are related to the tick bite and labs are reassuring However since she is concerned - please call in a px for doxycycline for her to take as directed (to cover tick bourne illnesses)  If hives continue - can set her up with an allergist Get INR checked next week please in light of antibiotic Update me next wk re:how she is feeling

## 2014-08-24 NOTE — Telephone Encounter (Signed)
Pt notified Rx sent to pharmacy and pt advise of Dr. Marliss Coots recommendations/instructions and verbalized understanding

## 2014-08-24 NOTE — Telephone Encounter (Signed)
-----   Message from Tammi Sou, [place] sent at 08/24/2014  4:07 PM EDT ----- Pt said no new sxs just random itching, and hives, pt said she is still having the head tingling and that makes her worried, pt said tick bite looks the exact same as when you saw it (purplish but no bullseye pattern), pt did say that her chills are gone and that's about the only thing that is better

## 2014-08-27 ENCOUNTER — Other Ambulatory Visit (INDEPENDENT_AMBULATORY_CARE_PROVIDER_SITE_OTHER): Payer: Medicare Other

## 2014-08-27 DIAGNOSIS — M955 Acquired deformity of pelvis: Secondary | ICD-10-CM | POA: Diagnosis not present

## 2014-08-27 DIAGNOSIS — Z5181 Encounter for therapeutic drug level monitoring: Secondary | ICD-10-CM

## 2014-08-27 DIAGNOSIS — M5416 Radiculopathy, lumbar region: Secondary | ICD-10-CM | POA: Diagnosis not present

## 2014-08-27 DIAGNOSIS — M9905 Segmental and somatic dysfunction of pelvic region: Secondary | ICD-10-CM | POA: Diagnosis not present

## 2014-08-27 DIAGNOSIS — M9903 Segmental and somatic dysfunction of lumbar region: Secondary | ICD-10-CM | POA: Diagnosis not present

## 2014-08-27 LAB — POCT INR: INR: 3.7

## 2014-08-29 ENCOUNTER — Emergency Department
Admission: EM | Admit: 2014-08-29 | Discharge: 2014-08-29 | Disposition: A | Discharge status: Home / Self Care | Payer: Medicare Other | Attending: Emergency Medicine | Admitting: Emergency Medicine

## 2014-08-29 DIAGNOSIS — M9905 Segmental and somatic dysfunction of pelvic region: Secondary | ICD-10-CM | POA: Diagnosis not present

## 2014-08-29 DIAGNOSIS — M9903 Segmental and somatic dysfunction of lumbar region: Secondary | ICD-10-CM | POA: Diagnosis not present

## 2014-08-29 DIAGNOSIS — M5416 Radiculopathy, lumbar region: Secondary | ICD-10-CM | POA: Diagnosis not present

## 2014-08-29 DIAGNOSIS — M955 Acquired deformity of pelvis: Secondary | ICD-10-CM | POA: Diagnosis not present

## 2014-08-30 DIAGNOSIS — M9903 Segmental and somatic dysfunction of lumbar region: Secondary | ICD-10-CM | POA: Diagnosis not present

## 2014-08-30 DIAGNOSIS — M955 Acquired deformity of pelvis: Secondary | ICD-10-CM | POA: Diagnosis not present

## 2014-08-30 DIAGNOSIS — M5416 Radiculopathy, lumbar region: Secondary | ICD-10-CM | POA: Diagnosis not present

## 2014-08-30 DIAGNOSIS — M9905 Segmental and somatic dysfunction of pelvic region: Secondary | ICD-10-CM | POA: Diagnosis not present

## 2014-09-04 DIAGNOSIS — H524 Presbyopia: Secondary | ICD-10-CM | POA: Diagnosis not present

## 2014-09-04 DIAGNOSIS — M955 Acquired deformity of pelvis: Secondary | ICD-10-CM | POA: Diagnosis not present

## 2014-09-04 DIAGNOSIS — H25013 Cortical age-related cataract, bilateral: Secondary | ICD-10-CM | POA: Diagnosis not present

## 2014-09-04 DIAGNOSIS — M9905 Segmental and somatic dysfunction of pelvic region: Secondary | ICD-10-CM | POA: Diagnosis not present

## 2014-09-04 DIAGNOSIS — M9903 Segmental and somatic dysfunction of lumbar region: Secondary | ICD-10-CM | POA: Diagnosis not present

## 2014-09-04 DIAGNOSIS — M5416 Radiculopathy, lumbar region: Secondary | ICD-10-CM | POA: Diagnosis not present

## 2014-09-06 DIAGNOSIS — M955 Acquired deformity of pelvis: Secondary | ICD-10-CM | POA: Diagnosis not present

## 2014-09-06 DIAGNOSIS — M5416 Radiculopathy, lumbar region: Secondary | ICD-10-CM | POA: Diagnosis not present

## 2014-09-06 DIAGNOSIS — M9905 Segmental and somatic dysfunction of pelvic region: Secondary | ICD-10-CM | POA: Diagnosis not present

## 2014-09-06 DIAGNOSIS — M9903 Segmental and somatic dysfunction of lumbar region: Secondary | ICD-10-CM | POA: Diagnosis not present

## 2014-09-12 DIAGNOSIS — M5416 Radiculopathy, lumbar region: Secondary | ICD-10-CM | POA: Diagnosis not present

## 2014-09-12 DIAGNOSIS — M955 Acquired deformity of pelvis: Secondary | ICD-10-CM | POA: Diagnosis not present

## 2014-09-12 DIAGNOSIS — M9903 Segmental and somatic dysfunction of lumbar region: Secondary | ICD-10-CM | POA: Diagnosis not present

## 2014-09-12 DIAGNOSIS — M9905 Segmental and somatic dysfunction of pelvic region: Secondary | ICD-10-CM | POA: Diagnosis not present

## 2014-09-14 NOTE — Telephone Encounter (Signed)
Pt left v/m; pt was seen 08/22/14 with tick bite; pt has taken the abx. Pt continues with symptoms,area where tick was look the same and pt continues with itching; the infected area is not receding; there are red blotches around where tick bit the pt. No fever. pt wants to know if should take another abx or what to do.pt request cb.[facility].

## 2014-09-14 NOTE — Telephone Encounter (Signed)
don't recommend another abx. Recommend schedule appt next week for eval. In interim over weekend may use vaseline ointment or moisturizing cream (low fragrance like aveeno or eucerin or dove) to skin to see if any change.

## 2014-09-14 NOTE — Telephone Encounter (Signed)
Message left advising patient.  

## 2014-09-17 ENCOUNTER — Other Ambulatory Visit: Payer: Medicare Other

## 2014-09-17 ENCOUNTER — Encounter: Payer: Self-pay | Admitting: Internal Medicine

## 2014-09-17 ENCOUNTER — Ambulatory Visit (INDEPENDENT_AMBULATORY_CARE_PROVIDER_SITE_OTHER): Payer: Medicare Other | Admitting: Internal Medicine

## 2014-09-17 ENCOUNTER — Ambulatory Visit (INDEPENDENT_AMBULATORY_CARE_PROVIDER_SITE_OTHER): Payer: Medicare Other | Admitting: *Deleted

## 2014-09-17 VITALS — BP 140/82 | HR 82 | Temp 98.0°F | Wt 171.8 lb

## 2014-09-17 DIAGNOSIS — M9903 Segmental and somatic dysfunction of lumbar region: Secondary | ICD-10-CM | POA: Diagnosis not present

## 2014-09-17 DIAGNOSIS — L509 Urticaria, unspecified: Secondary | ICD-10-CM

## 2014-09-17 DIAGNOSIS — M955 Acquired deformity of pelvis: Secondary | ICD-10-CM | POA: Diagnosis not present

## 2014-09-17 DIAGNOSIS — S70261D Insect bite (nonvenomous), right hip, subsequent encounter: Secondary | ICD-10-CM

## 2014-09-17 DIAGNOSIS — M5416 Radiculopathy, lumbar region: Secondary | ICD-10-CM | POA: Diagnosis not present

## 2014-09-17 DIAGNOSIS — I4819 Other persistent atrial fibrillation: Secondary | ICD-10-CM

## 2014-09-17 DIAGNOSIS — M9905 Segmental and somatic dysfunction of pelvic region: Secondary | ICD-10-CM | POA: Diagnosis not present

## 2014-09-17 DIAGNOSIS — I481 Persistent atrial fibrillation: Secondary | ICD-10-CM

## 2014-09-17 DIAGNOSIS — W57XXXD Bitten or stung by nonvenomous insect and other nonvenomous arthropods, subsequent encounter: Secondary | ICD-10-CM | POA: Diagnosis not present

## 2014-09-17 DIAGNOSIS — Z5181 Encounter for therapeutic drug level monitoring: Secondary | ICD-10-CM

## 2014-09-17 LAB — POCT INR: INR: 3.6

## 2014-09-17 NOTE — Progress Notes (Signed)
Pre visit review using our clinic review tool, if applicable. No additional management support is needed unless otherwise documented below in the visit note. 

## 2014-09-17 NOTE — Progress Notes (Signed)
Subjective:    Patient ID: [name], female    DOB: 03/31/1932, 81 y.o.   MRN: [id number]  HPI  Pt presents to the clinic today to follow up her visit from 08/22/14. She had c/o tingling in her scalp and hives. Her symptoms are worse with heat. They do seem to come and go. The pt felt like this was related to a tick bite that had occurred 6-8 weeks prior although she denies rash, fever, or joint pains. Dr. Glori Bickers did a tick borne illness workup which was negative. She advised her to put Elocon cream to the bite site. She called back 2 days later without improvement, so Dr. Glori Bickers put her on Doxycycline x 10 days. She finished the Doxycycline and reports there was still not improvement in the tick bite. It is still red around the tick bite. She continues to have intermittent hives and itching. She takes the Claritin daily which helps. She is not sure why the area of the tick bite has not improved.  Review of Systems      Past Medical History  Diagnosis Date  . TIA (transient ischemic attack) 2010    "left facial numbness after I drank a thermos of iced coffee; not sure it was a TIA"  . Cardiomyopathy     rate-related-resolved  . Atrial fibrillation -persistent     a. s/p PVI [name] 2010;  b. on tikosyn/coumadin;  c. 05/2009 Echo: EF 60-65%, Gr 2 DD.  [name] FH: colonic polyps   . Carotid stenosis     mild (hosp 3/11)- consult by vasc/ Dr Donnetta Hutching  . Pulmonary nodule   . Depression   . Hyperlipidemia   . Syncope and collapse   . 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 Diverticulosis   . Fatty liver   . Tubular adenoma   . Complication of anesthesia     "I have trouble waking up; I'm very sensitive to anesthesia"  . Heart murmur     "prior to valve repair"  . Asthma     "brought out by smoke, air quality"  . Pneumonia 1951    "double"  . Allergic rhinitis     Current Outpatient Prescriptions  Medication Sig Dispense Refill  . albuterol (PROVENTIL HFA;VENTOLIN HFA) 108 (90 BASE) MCG/ACT inhaler  Inhale 2 puffs into the lungs every 4 (four) hours as needed for wheezing or shortness of breath. 1 Inhaler 0  . beclomethasone (QVAR) 40 MCG/ACT inhaler Inhale 2 puffs into the lungs as needed.    . chlorpheniramine (CHLOR-TRIMETON) 4 MG tablet Take 4 mg by mouth at bedtime as needed.     . digoxin (LANOXIN) 0.125 MG tablet Take 0.125 mg by mouth daily.    [name] diltiazem (CARDIZEM CD) 180 MG 24 hr capsule TAKE 1 CAPSULE (180 MG TOTAL) BY MOUTH DAILY. 30 capsule 3  . levothyroxine (SYNTHROID, LEVOTHROID) 25 MCG tablet TAKE 1 TABLET (25 MCG TOTAL) BY MOUTH DAILY BEFORE BREAKFAST. 30 tablet 11  . loratadine (CLARITIN) 10 MG tablet Take 10 mg by mouth daily.    . mometasone (ELOCON) 0.1 % cream Apply 1 application topically daily. 15 g 0  . warfarin (COUMADIN) 5 MG tablet Take by mouth or as directed 90 tablet 1   No current facility-administered medications for this visit.    Allergies  Allergen Reactions  . Amiodarone Hcl     REACTION: Intolerance  . Penicillins     REACTION: rash  . Statins  REACTION: rash    Family History  Problem Relation Age of Onset  . Hypertension Mother   . Lung cancer Father     smoker  . Alcohol abuse Father   . Cancer Father     bladder and lung CA smoker    History   Social History  . Marital Status: Widowed    Spouse Name: N/A  . Number of Children: 6  . Years of Education: N/A   Occupational History  . realtor    Social History Main Topics  . Smoking status: Never Smoker   . Smokeless tobacco: Never Used  . Alcohol Use: 0.0 oz/week    0 Standard drinks or equivalent per week     Comment: 04/26/2014 "occasional glass of wine; a few times/yr"  . Drug Use: No  . Sexual Activity: No   Other Topics Concern  . Not on file   Social History Narrative   Retired. Daily Caffeine use: 2 daily      Constitutional: Pt reports fatigue. Denies fever, malaise, headache or abrupt weight changes.  Respiratory: Denies difficulty breathing,  shortness of breath, cough or sputum production.   Cardiovascular: Denies chest pain, chest tightness, palpitations or swelling in the hands or feet.  Gastrointestinal: Denies abdominal pain, bloating, constipation, diarrhea or blood in the stool.  Musculoskeletal: Denies decrease in range of motion, difficulty with gait, muscle pain or joint pain and swelling.  Skin: Pt reports tick bite to right hip and intermittent hives.  Neurological: Pt reports tingling of scalp. Denies dizziness, difficulty with memory, difficulty with speech or problems with balance and coordination.   No other specific complaints in a complete review of systems (except as listed in HPI above).  Objective:   Physical Exam   BP 140/82 mmHg  Pulse 82  Temp(Src) 98 F (36.7 C) (Oral)  Wt 171 lb 12 oz (77.905 kg)  SpO2 97% Wt Readings from Last 3 Encounters:  09/17/14 171 lb 12 oz (77.905 kg)  08/22/14 170 lb 12 oz (77.452 kg)  08/02/14 172 lb 6.4 oz (78.2 kg)    General: Appears her stated age, well developed, well nourished in NAD. Skin: Warm, dry and intact. 1 cm round area of erythema with central ulceration/scabbing noted on right anterior hip. No erythema migrans noted. Cardiovascular: Normal rate and rhythm. S1,S2 noted.  No murmur, rubs or gallops noted.  Pulmonary/Chest: Normal effort and positive vesicular breath sounds. No respiratory distress. No wheezes, rales or ronchi noted.  Neurological: Alert and oriented.  Psychiatric: She seems very anxious and upset today.  BMET    Component Value Date/Time   NA 139 05/02/2014 1249   NA 139 06/26/2013 2220   NA 143 02/23/2013 0937   K 4.5 05/02/2014 1249   K 3.5 06/26/2013 2220   CL 104 05/02/2014 1249   CL 106 06/26/2013 2220   CO2 29 05/02/2014 1249   CO2 27 06/26/2013 2220   GLUCOSE 88 05/02/2014 1249   GLUCOSE 136* 06/26/2013 2220   GLUCOSE 85 02/23/2013 0937   BUN 28* 05/02/2014 1249   BUN 14 06/26/2013 2220   BUN 21 02/23/2013 0937    CREATININE 0.76 05/02/2014 1249   CREATININE 0.83 06/26/2013 2220   CALCIUM 9.7 05/02/2014 1249   CALCIUM 8.3* 06/26/2013 2220   GFRNONAA 77* 04/27/2014 0520   GFRNONAA >60 06/26/2013 2220   GFRAA 90* 04/27/2014 0520   GFRAA >60 06/26/2013 2220    Lipid Panel     Component Value Date/Time  CHOL * 06/13/2009 0430    268        ATP III CLASSIFICATION:  <200     mg/dL   Desirable  200-239  mg/dL   Borderline High  >=240    mg/dL   High          TRIG 113 06/13/2009 0430   HDL 41 06/13/2009 0430   CHOLHDL 6.5 06/13/2009 0430   VLDL 23 06/13/2009 0430   LDLCALC * 06/13/2009 0430    204        Total Cholesterol/HDL:CHD Risk Coronary Heart Disease Risk Table                     Men   Women  1/2 Average Risk   3.4   3.3  Average Risk       5.0   4.4  2 X Average Risk   9.6   7.1  3 X Average Risk  23.4   11.0        Use the calculated Patient Ratio above and the CHD Risk Table to determine the patient's CHD Risk.        ATP III CLASSIFICATION (LDL):  <100     mg/dL   Optimal  100-129  mg/dL   Near or Above                    Optimal  130-159  mg/dL   Borderline  160-189  mg/dL   High  >190     mg/dL   Very High    CBC    Component Value Date/Time   WBC 6.1 05/02/2014 1249   WBC 3.5* 06/26/2013 2220   WBC 4.9 12/27/2012 1149   RBC 4.61 05/02/2014 1249   RBC 4.82 06/26/2013 2220   RBC 4.72 12/27/2012 1149   HGB 12.9 05/02/2014 1249   HGB 13.5 06/26/2013 2220   HCT 38.8 05/02/2014 1249   HCT 41.4 06/26/2013 2220   PLT 176.0 05/02/2014 1249   PLT 137* 06/26/2013 2220   MCV 84.2 05/02/2014 1249   MCV 86 06/26/2013 2220   MCH 28.2 04/26/2014 1030   MCH 28.1 06/26/2013 2220   MCH 27.8 12/27/2012 1149   MCHC 33.2 05/02/2014 1249   MCHC 32.7 06/26/2013 2220   MCHC 33.4 12/27/2012 1149   RDW 15.4 05/02/2014 1249   RDW 14.6* 06/26/2013 2220   RDW 15.3 12/27/2012 1149   LYMPHSABS 1.4 05/02/2014 1249   MONOABS 0.6 05/02/2014 1249   EOSABS 0.2 05/02/2014 1249    BASOSABS 0.0 05/02/2014 1249    Hgb A1C No results found for: HGBA1C      Assessment & Plan:  Tick bite of left hip:  Does not look infected ? Why it has not resolved after 2 months Continue hydrocortisone Will discuss with Dr. Glori Bickers, no new symptoms, rash, fever, joint pains etc Offered referral to Allergist per Dr. Alba Cory last note, she declines at this time, she will continue taking Claritin daily  Will follow up with your after I discuss with Dr. Glori Bickers

## 2014-09-17 NOTE — Patient Instructions (Signed)
Tick Bite Information Ticks are insects that attach themselves to the skin and draw blood for food. There are various types of ticks. Common types include wood ticks and deer ticks. Most ticks live in shrubs and grassy areas. Ticks can climb onto your body when you make contact with leaves or grass where the tick is waiting. The most common places on the body for ticks to attach themselves are the scalp, neck, armpits, waist, and groin. Most tick bites are harmless, but sometimes ticks carry germs that cause diseases. These germs can be spread to a person during the tick's feeding process. The chance of a disease spreading through a tick bite depends on:   The type of tick.  Time of year.   How long the tick is attached.   Geographic location.  HOW CAN YOU PREVENT TICK BITES? Take these steps to help prevent tick bites when you are outdoors:  Wear protective clothing. Long sleeves and long pants are best.   Wear white clothes so you can see ticks more easily.  Tuck your pant legs into your socks.   If walking on a trail, stay in the middle of the trail to avoid brushing against bushes.  Avoid walking through areas with long grass.  Put insect repellent on all exposed skin and along boot tops, pant legs, and sleeve cuffs.   Check clothing, hair, and skin repeatedly and before going inside.   Brush off any ticks that are not attached.  Take a shower or bath as soon as possible after being outdoors.  WHAT IS THE PROPER WAY TO REMOVE A TICK? Ticks should be removed as soon as possible to help prevent diseases caused by tick bites. 1. If latex gloves are available, put them on before trying to remove a tick.  2. Using fine-point tweezers, grasp the tick as close to the skin as possible. You may also use curved forceps or a tick removal tool. Grasp the tick as close to its head as possible. Avoid grasping the tick on its body. 3. Pull gently with steady upward pressure until  the tick lets go. Do not twist the tick or jerk it suddenly. This may break off the tick's head or mouth parts. 4. Do not squeeze or crush the tick's body. This could force disease-carrying fluids from the tick into your body.  5. After the tick is removed, wash the bite area and your hands with soap and water or other disinfectant such as alcohol. 6. Apply a small amount of antiseptic cream or ointment to the bite site.  7. Wash and disinfect any instruments that were used.  Do not try to remove a tick by applying a hot match, petroleum jelly, or fingernail polish to the tick. These methods do not work and may increase the chances of disease being spread from the tick bite.  WHEN SHOULD YOU SEEK MEDICAL CARE? Contact your health care provider if you are unable to remove a tick from your skin or if a part of the tick breaks off and is stuck in the skin.  After a tick bite, you need to be aware of signs and symptoms that could be related to diseases spread by ticks. Contact your health care provider if you develop any of the following in the days or weeks after the tick bite:  Unexplained fever.  Rash. A circular rash that appears days or weeks after the tick bite may indicate the possibility of Lyme disease. The rash may resemble   a target with a bull's-eye and may occur at a different part of your body than the tick bite.  Redness and swelling in the area of the tick bite.   Tender, swollen lymph glands.   Diarrhea.   Weight loss.   Cough.   Fatigue.   Muscle, joint, or bone pain.   Abdominal pain.   Headache.   Lethargy or a change in your level of consciousness.  Difficulty walking or moving your legs.   Numbness in the legs.   Paralysis.  Shortness of breath.   Confusion.   Repeated vomiting.  Document Released: 03/06/2000 Document Revised: 12/28/2012 Document Reviewed: 08/17/2012 Exit[employer] Patient Information 2015 [employer]. This information is  not intended to replace advice given to you by your health care provider. Make sure you discuss any questions you have with your health care provider.  

## 2014-09-19 DIAGNOSIS — M955 Acquired deformity of pelvis: Secondary | ICD-10-CM | POA: Diagnosis not present

## 2014-09-19 DIAGNOSIS — M5416 Radiculopathy, lumbar region: Secondary | ICD-10-CM | POA: Diagnosis not present

## 2014-09-19 DIAGNOSIS — M9905 Segmental and somatic dysfunction of pelvic region: Secondary | ICD-10-CM | POA: Diagnosis not present

## 2014-09-19 DIAGNOSIS — M9903 Segmental and somatic dysfunction of lumbar region: Secondary | ICD-10-CM | POA: Diagnosis not present

## 2014-09-21 DIAGNOSIS — M9903 Segmental and somatic dysfunction of lumbar region: Secondary | ICD-10-CM | POA: Diagnosis not present

## 2014-09-21 DIAGNOSIS — M955 Acquired deformity of pelvis: Secondary | ICD-10-CM | POA: Diagnosis not present

## 2014-09-21 DIAGNOSIS — M9905 Segmental and somatic dysfunction of pelvic region: Secondary | ICD-10-CM | POA: Diagnosis not present

## 2014-09-21 DIAGNOSIS — M5416 Radiculopathy, lumbar region: Secondary | ICD-10-CM | POA: Diagnosis not present

## 2014-09-25 DIAGNOSIS — M9905 Segmental and somatic dysfunction of pelvic region: Secondary | ICD-10-CM | POA: Diagnosis not present

## 2014-09-25 DIAGNOSIS — M9903 Segmental and somatic dysfunction of lumbar region: Secondary | ICD-10-CM | POA: Diagnosis not present

## 2014-09-25 DIAGNOSIS — M955 Acquired deformity of pelvis: Secondary | ICD-10-CM | POA: Diagnosis not present

## 2014-09-25 DIAGNOSIS — M5416 Radiculopathy, lumbar region: Secondary | ICD-10-CM | POA: Diagnosis not present

## 2014-09-27 DIAGNOSIS — M955 Acquired deformity of pelvis: Secondary | ICD-10-CM | POA: Diagnosis not present

## 2014-09-27 DIAGNOSIS — M9903 Segmental and somatic dysfunction of lumbar region: Secondary | ICD-10-CM | POA: Diagnosis not present

## 2014-09-27 DIAGNOSIS — M5416 Radiculopathy, lumbar region: Secondary | ICD-10-CM | POA: Diagnosis not present

## 2014-09-27 DIAGNOSIS — M9905 Segmental and somatic dysfunction of pelvic region: Secondary | ICD-10-CM | POA: Diagnosis not present

## 2014-10-08 ENCOUNTER — Ambulatory Visit (INDEPENDENT_AMBULATORY_CARE_PROVIDER_SITE_OTHER): Payer: Medicare Other | Admitting: *Deleted

## 2014-10-08 DIAGNOSIS — I4819 Other persistent atrial fibrillation: Secondary | ICD-10-CM

## 2014-10-08 DIAGNOSIS — I481 Persistent atrial fibrillation: Secondary | ICD-10-CM

## 2014-10-08 DIAGNOSIS — M955 Acquired deformity of pelvis: Secondary | ICD-10-CM | POA: Diagnosis not present

## 2014-10-08 DIAGNOSIS — Z5181 Encounter for therapeutic drug level monitoring: Secondary | ICD-10-CM

## 2014-10-08 DIAGNOSIS — M9905 Segmental and somatic dysfunction of pelvic region: Secondary | ICD-10-CM | POA: Diagnosis not present

## 2014-10-08 DIAGNOSIS — M5416 Radiculopathy, lumbar region: Secondary | ICD-10-CM | POA: Diagnosis not present

## 2014-10-08 DIAGNOSIS — M9903 Segmental and somatic dysfunction of lumbar region: Secondary | ICD-10-CM | POA: Diagnosis not present

## 2014-10-08 LAB — POCT INR: INR: 3.2

## 2014-10-08 NOTE — Progress Notes (Signed)
Pre visit review using our clinic review tool, if applicable. No additional management support is needed unless otherwise documented below in the visit note. 

## 2014-10-10 DIAGNOSIS — M9905 Segmental and somatic dysfunction of pelvic region: Secondary | ICD-10-CM | POA: Diagnosis not present

## 2014-10-10 DIAGNOSIS — M9903 Segmental and somatic dysfunction of lumbar region: Secondary | ICD-10-CM | POA: Diagnosis not present

## 2014-10-10 DIAGNOSIS — M5416 Radiculopathy, lumbar region: Secondary | ICD-10-CM | POA: Diagnosis not present

## 2014-10-10 DIAGNOSIS — M955 Acquired deformity of pelvis: Secondary | ICD-10-CM | POA: Diagnosis not present

## 2014-10-12 DIAGNOSIS — M5416 Radiculopathy, lumbar region: Secondary | ICD-10-CM | POA: Diagnosis not present

## 2014-10-12 DIAGNOSIS — M9903 Segmental and somatic dysfunction of lumbar region: Secondary | ICD-10-CM | POA: Diagnosis not present

## 2014-10-12 DIAGNOSIS — M9905 Segmental and somatic dysfunction of pelvic region: Secondary | ICD-10-CM | POA: Diagnosis not present

## 2014-10-12 DIAGNOSIS — M955 Acquired deformity of pelvis: Secondary | ICD-10-CM | POA: Diagnosis not present

## 2014-10-16 DIAGNOSIS — M5416 Radiculopathy, lumbar region: Secondary | ICD-10-CM | POA: Diagnosis not present

## 2014-10-16 DIAGNOSIS — M955 Acquired deformity of pelvis: Secondary | ICD-10-CM | POA: Diagnosis not present

## 2014-10-16 DIAGNOSIS — M9903 Segmental and somatic dysfunction of lumbar region: Secondary | ICD-10-CM | POA: Diagnosis not present

## 2014-10-16 DIAGNOSIS — M9905 Segmental and somatic dysfunction of pelvic region: Secondary | ICD-10-CM | POA: Diagnosis not present

## 2014-10-19 DIAGNOSIS — M955 Acquired deformity of pelvis: Secondary | ICD-10-CM | POA: Diagnosis not present

## 2014-10-19 DIAGNOSIS — M9905 Segmental and somatic dysfunction of pelvic region: Secondary | ICD-10-CM | POA: Diagnosis not present

## 2014-10-19 DIAGNOSIS — M5416 Radiculopathy, lumbar region: Secondary | ICD-10-CM | POA: Diagnosis not present

## 2014-10-19 DIAGNOSIS — M9903 Segmental and somatic dysfunction of lumbar region: Secondary | ICD-10-CM | POA: Diagnosis not present

## 2014-10-23 DIAGNOSIS — M5416 Radiculopathy, lumbar region: Secondary | ICD-10-CM | POA: Diagnosis not present

## 2014-10-23 DIAGNOSIS — M955 Acquired deformity of pelvis: Secondary | ICD-10-CM | POA: Diagnosis not present

## 2014-10-23 DIAGNOSIS — M9905 Segmental and somatic dysfunction of pelvic region: Secondary | ICD-10-CM | POA: Diagnosis not present

## 2014-10-23 DIAGNOSIS — M9903 Segmental and somatic dysfunction of lumbar region: Secondary | ICD-10-CM | POA: Diagnosis not present

## 2014-10-24 ENCOUNTER — Other Ambulatory Visit: Payer: Self-pay | Admitting: *Deleted

## 2014-10-24 ENCOUNTER — Other Ambulatory Visit: Payer: Self-pay

## 2014-10-24 MED ORDER — WARFARIN SODIUM 5 MG PO TABS: ORAL_TABLET | ORAL | Status: DC

## 2014-10-24 MED ORDER — DIGOXIN 125 MCG PO TABS: 0.1250 mg | ORAL_TABLET | Freq: Every day | ORAL | Status: DC

## 2014-10-24 MED ORDER — DILTIAZEM HCL ER COATED BEADS 180 MG PO CP24: ORAL_CAPSULE | ORAL | Status: DC

## 2014-10-24 MED ORDER — LEVOTHYROXINE SODIUM 25 MCG PO TABS: ORAL_TABLET | ORAL | Status: DC

## 2014-10-24 NOTE — Telephone Encounter (Signed)
Received 90 day refill request from [facility] on warfarin and levothyroxine. Normal TSH lab on 08/02/14, so I refilled levothyroxine  #90x2. Warfarin request forwarded to [facility] for authorization.

## 2014-10-26 ENCOUNTER — Other Ambulatory Visit: Payer: Self-pay | Admitting: *Deleted

## 2014-10-29 DIAGNOSIS — M5416 Radiculopathy, lumbar region: Secondary | ICD-10-CM | POA: Diagnosis not present

## 2014-10-29 DIAGNOSIS — M9905 Segmental and somatic dysfunction of pelvic region: Secondary | ICD-10-CM | POA: Diagnosis not present

## 2014-10-29 DIAGNOSIS — M9903 Segmental and somatic dysfunction of lumbar region: Secondary | ICD-10-CM | POA: Diagnosis not present

## 2014-10-29 DIAGNOSIS — M955 Acquired deformity of pelvis: Secondary | ICD-10-CM | POA: Diagnosis not present

## 2014-11-05 ENCOUNTER — Ambulatory Visit (INDEPENDENT_AMBULATORY_CARE_PROVIDER_SITE_OTHER): Payer: Medicare Other | Admitting: *Deleted

## 2014-11-05 DIAGNOSIS — I481 Persistent atrial fibrillation: Secondary | ICD-10-CM

## 2014-11-05 DIAGNOSIS — I4819 Other persistent atrial fibrillation: Secondary | ICD-10-CM

## 2014-11-05 DIAGNOSIS — M5416 Radiculopathy, lumbar region: Secondary | ICD-10-CM | POA: Diagnosis not present

## 2014-11-05 DIAGNOSIS — M955 Acquired deformity of pelvis: Secondary | ICD-10-CM | POA: Diagnosis not present

## 2014-11-05 DIAGNOSIS — M9903 Segmental and somatic dysfunction of lumbar region: Secondary | ICD-10-CM | POA: Diagnosis not present

## 2014-11-05 DIAGNOSIS — Z5181 Encounter for therapeutic drug level monitoring: Secondary | ICD-10-CM

## 2014-11-05 DIAGNOSIS — M9905 Segmental and somatic dysfunction of pelvic region: Secondary | ICD-10-CM | POA: Diagnosis not present

## 2014-11-05 LAB — POCT INR: INR: 2

## 2014-11-05 NOTE — Progress Notes (Signed)
Pre visit review using our clinic review tool, if applicable. No additional management support is needed unless otherwise documented below in the visit note. 

## 2014-11-12 ENCOUNTER — Other Ambulatory Visit: Payer: Self-pay | Admitting: Internal Medicine

## 2014-11-15 DIAGNOSIS — M9905 Segmental and somatic dysfunction of pelvic region: Secondary | ICD-10-CM | POA: Diagnosis not present

## 2014-11-15 DIAGNOSIS — M955 Acquired deformity of pelvis: Secondary | ICD-10-CM | POA: Diagnosis not present

## 2014-11-15 DIAGNOSIS — M5416 Radiculopathy, lumbar region: Secondary | ICD-10-CM | POA: Diagnosis not present

## 2014-11-15 DIAGNOSIS — M9903 Segmental and somatic dysfunction of lumbar region: Secondary | ICD-10-CM | POA: Diagnosis not present

## 2014-11-20 ENCOUNTER — Telehealth: Payer: Self-pay | Admitting: Family Medicine

## 2014-11-20 DIAGNOSIS — W57XXXA Bitten or stung by nonvenomous insect and other nonvenomous arthropods, initial encounter: Secondary | ICD-10-CM

## 2014-11-20 NOTE — Telephone Encounter (Signed)
Pt notified of Dr. Tower's comments and lab appt scheduled  

## 2014-11-20 NOTE — Telephone Encounter (Signed)
Her last labs for lyme and for RMSF were neg I ordered [facility] test in light of new symptoms Please schedule lab visit

## 2014-11-20 NOTE — Telephone Encounter (Signed)
Pt called stating she still has tick bite from march that has not healed.  She wanted to know if she could get a lab called [facility]  For lime diease. She has been aching all over. Problem with walking hip inflamed. She didn't have this before the tick bite

## 2014-11-21 ENCOUNTER — Other Ambulatory Visit (INDEPENDENT_AMBULATORY_CARE_PROVIDER_SITE_OTHER): Payer: Medicare Other

## 2014-11-21 DIAGNOSIS — T148 Other injury of unspecified body region: Secondary | ICD-10-CM

## 2014-11-21 DIAGNOSIS — W57XXXA Bitten or stung by nonvenomous insect and other nonvenomous arthropods, initial encounter: Secondary | ICD-10-CM | POA: Diagnosis not present

## 2014-11-22 DIAGNOSIS — M9905 Segmental and somatic dysfunction of pelvic region: Secondary | ICD-10-CM | POA: Diagnosis not present

## 2014-11-22 DIAGNOSIS — M9903 Segmental and somatic dysfunction of lumbar region: Secondary | ICD-10-CM | POA: Diagnosis not present

## 2014-11-22 DIAGNOSIS — M955 Acquired deformity of pelvis: Secondary | ICD-10-CM | POA: Diagnosis not present

## 2014-11-22 DIAGNOSIS — M5416 Radiculopathy, lumbar region: Secondary | ICD-10-CM | POA: Diagnosis not present

## 2014-11-22 LAB — B. BURGDORFI ANTIBODIES BY WB
B BURGDORFERI IGG ABS (IB): NEGATIVE
B burgdorferi IgM Abs (IB): NEGATIVE

## 2014-11-28 DIAGNOSIS — M9903 Segmental and somatic dysfunction of lumbar region: Secondary | ICD-10-CM | POA: Diagnosis not present

## 2014-11-28 DIAGNOSIS — M5416 Radiculopathy, lumbar region: Secondary | ICD-10-CM | POA: Diagnosis not present

## 2014-11-28 DIAGNOSIS — M955 Acquired deformity of pelvis: Secondary | ICD-10-CM | POA: Diagnosis not present

## 2014-11-28 DIAGNOSIS — M9905 Segmental and somatic dysfunction of pelvic region: Secondary | ICD-10-CM | POA: Diagnosis not present

## 2014-12-03 ENCOUNTER — Other Ambulatory Visit (INDEPENDENT_AMBULATORY_CARE_PROVIDER_SITE_OTHER): Payer: Medicare Other

## 2014-12-03 ENCOUNTER — Ambulatory Visit: Payer: Medicare Other | Admitting: Family Medicine

## 2014-12-03 DIAGNOSIS — Z5181 Encounter for therapeutic drug level monitoring: Secondary | ICD-10-CM

## 2014-12-03 DIAGNOSIS — I481 Persistent atrial fibrillation: Secondary | ICD-10-CM | POA: Diagnosis not present

## 2014-12-03 DIAGNOSIS — I4819 Other persistent atrial fibrillation: Secondary | ICD-10-CM

## 2014-12-03 LAB — POCT INR: INR: 2.8

## 2014-12-12 ENCOUNTER — Other Ambulatory Visit: Payer: Self-pay | Admitting: Internal Medicine

## 2014-12-25 DIAGNOSIS — M955 Acquired deformity of pelvis: Secondary | ICD-10-CM | POA: Diagnosis not present

## 2014-12-25 DIAGNOSIS — M9903 Segmental and somatic dysfunction of lumbar region: Secondary | ICD-10-CM | POA: Diagnosis not present

## 2014-12-25 DIAGNOSIS — M9905 Segmental and somatic dysfunction of pelvic region: Secondary | ICD-10-CM | POA: Diagnosis not present

## 2014-12-25 DIAGNOSIS — M5416 Radiculopathy, lumbar region: Secondary | ICD-10-CM | POA: Diagnosis not present

## 2015-01-04 ENCOUNTER — Other Ambulatory Visit: Payer: Self-pay | Admitting: Family Medicine

## 2015-01-04 ENCOUNTER — Ambulatory Visit (INDEPENDENT_AMBULATORY_CARE_PROVIDER_SITE_OTHER): Payer: Medicare Other | Admitting: *Deleted

## 2015-01-04 ENCOUNTER — Other Ambulatory Visit: Payer: Self-pay | Admitting: Internal Medicine

## 2015-01-04 ENCOUNTER — Other Ambulatory Visit: Payer: Self-pay

## 2015-01-04 DIAGNOSIS — Z5181 Encounter for therapeutic drug level monitoring: Secondary | ICD-10-CM | POA: Diagnosis not present

## 2015-01-04 DIAGNOSIS — I4819 Other persistent atrial fibrillation: Secondary | ICD-10-CM

## 2015-01-04 DIAGNOSIS — I481 Persistent atrial fibrillation: Secondary | ICD-10-CM | POA: Diagnosis not present

## 2015-01-04 LAB — POCT INR: INR: 2.7

## 2015-01-04 MED ORDER — DIGOXIN 125 MCG PO TABS: 0.1250 mg | ORAL_TABLET | Freq: Every day | ORAL | Status: DC

## 2015-01-04 NOTE — Telephone Encounter (Signed)
Rout to [facility]

## 2015-01-04 NOTE — Progress Notes (Signed)
Pre visit review using our clinic review tool, if applicable. No additional management support is needed unless otherwise documented below in the visit note. 

## 2015-01-16 DIAGNOSIS — M5416 Radiculopathy, lumbar region: Secondary | ICD-10-CM | POA: Diagnosis not present

## 2015-01-16 DIAGNOSIS — M9903 Segmental and somatic dysfunction of lumbar region: Secondary | ICD-10-CM | POA: Diagnosis not present

## 2015-01-16 DIAGNOSIS — M9905 Segmental and somatic dysfunction of pelvic region: Secondary | ICD-10-CM | POA: Diagnosis not present

## 2015-01-16 DIAGNOSIS — M955 Acquired deformity of pelvis: Secondary | ICD-10-CM | POA: Diagnosis not present

## 2015-01-22 ENCOUNTER — Other Ambulatory Visit: Payer: Self-pay | Admitting: Internal Medicine

## 2015-01-30 ENCOUNTER — Other Ambulatory Visit: Payer: Self-pay | Admitting: Internal Medicine

## 2015-01-31 ENCOUNTER — Ambulatory Visit (INDEPENDENT_AMBULATORY_CARE_PROVIDER_SITE_OTHER): Payer: Medicare Other | Admitting: *Deleted

## 2015-01-31 DIAGNOSIS — I481 Persistent atrial fibrillation: Secondary | ICD-10-CM

## 2015-01-31 DIAGNOSIS — I4819 Other persistent atrial fibrillation: Secondary | ICD-10-CM

## 2015-01-31 DIAGNOSIS — Z5181 Encounter for therapeutic drug level monitoring: Secondary | ICD-10-CM | POA: Diagnosis not present

## 2015-01-31 LAB — POCT INR: INR: 2.3

## 2015-01-31 NOTE — Progress Notes (Signed)
Pre visit review using our clinic review tool, if applicable. No additional management support is needed unless otherwise documented below in the visit note. 

## 2015-01-31 NOTE — Telephone Encounter (Signed)
refill 

## 2015-02-07 ENCOUNTER — Telehealth: Payer: Self-pay | Admitting: Internal Medicine

## 2015-02-07 MED ORDER — DIGOXIN 125 MCG PO TABS: 0.1250 mg | ORAL_TABLET | Freq: Every day | ORAL | Status: DC

## 2015-02-07 NOTE — Telephone Encounter (Signed)
Pt called requesting a 90 day supply on medication and also made an appointment.

## 2015-02-13 DIAGNOSIS — M9903 Segmental and somatic dysfunction of lumbar region: Secondary | ICD-10-CM | POA: Diagnosis not present

## 2015-02-13 DIAGNOSIS — M955 Acquired deformity of pelvis: Secondary | ICD-10-CM | POA: Diagnosis not present

## 2015-02-13 DIAGNOSIS — M9905 Segmental and somatic dysfunction of pelvic region: Secondary | ICD-10-CM | POA: Diagnosis not present

## 2015-02-13 DIAGNOSIS — M5416 Radiculopathy, lumbar region: Secondary | ICD-10-CM | POA: Diagnosis not present

## 2015-02-28 ENCOUNTER — Telehealth: Payer: Self-pay | Admitting: Family Medicine

## 2015-02-28 ENCOUNTER — Ambulatory Visit (INDEPENDENT_AMBULATORY_CARE_PROVIDER_SITE_OTHER): Payer: Medicare Other | Admitting: *Deleted

## 2015-02-28 DIAGNOSIS — I4819 Other persistent atrial fibrillation: Secondary | ICD-10-CM

## 2015-02-28 DIAGNOSIS — Z5181 Encounter for therapeutic drug level monitoring: Secondary | ICD-10-CM | POA: Diagnosis not present

## 2015-02-28 DIAGNOSIS — I481 Persistent atrial fibrillation: Secondary | ICD-10-CM | POA: Diagnosis not present

## 2015-02-28 DIAGNOSIS — W57XXXA Bitten or stung by nonvenomous insect and other nonvenomous arthropods, initial encounter: Secondary | ICD-10-CM

## 2015-02-28 LAB — POCT INR: INR: 2.5

## 2015-02-28 NOTE — Progress Notes (Signed)
Pre visit review using our clinic review tool, if applicable. No additional management support is needed unless otherwise documented below in the visit note. 

## 2015-02-28 NOTE — Telephone Encounter (Signed)
Left message for patient to return phone call.  

## 2015-02-28 NOTE — Telephone Encounter (Signed)
Is she still getting hives / itching elsewhere? - I need to choose between dermatology or allergy referral  Thanks

## 2015-02-28 NOTE — Telephone Encounter (Signed)
Pt would like a referral for her tick bite.  It still is very red and itching.

## 2015-03-01 NOTE — Telephone Encounter (Signed)
I will do inf disease referral and route to [facility]

## 2015-03-01 NOTE — Telephone Encounter (Signed)
Pt said she was having body pain all over from April through Oct and it just now stopped, pt said the tick bite is still there and it's still red, swollen, itches and there is a knot about the size of a dime there, pt said when the itching starts around the bug bite then she will start itching all over her body in different places and eventually break out all over in hives. Pt said she didn't want to see a dermatologist or an allergist pt wants to see a "poison specialist because the tick bite has poisoned her body/blood"

## 2015-03-01 NOTE — Telephone Encounter (Signed)
Referral faxed to [facility] ID Dr Ola Spurr, waiting for appt to be made. Patient aware.

## 2015-03-04 ENCOUNTER — Other Ambulatory Visit: Payer: Self-pay | Admitting: Family Medicine

## 2015-03-05 NOTE — Telephone Encounter (Signed)
Rout to [facility] 

## 2015-03-13 ENCOUNTER — Telehealth: Payer: Self-pay | Admitting: Internal Medicine

## 2015-03-13 NOTE — Telephone Encounter (Signed)
I called and spoke with the patient.  She has been having unilateral swelling to her left foot/ ankle for about a week. She has had no change in her diet/ sodium intake/ she has not traveled recently. 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 She denies any pain to the site. She reports occasional "angina" type pain, but feels this is infrequent and fleeting. This may be musculoskeletal in nature as it sounds as though this can be worse with movement. I advised the patient that her symptoms seem to be more related to venous insufficiency. I have advised her to monitor her symptoms, keep her extremity elevated, and wear a tight fitting sock if this is available. She is agreeable and verbalizes understanding.

## 2015-03-13 NOTE — Telephone Encounter (Signed)
New message       Pt c/o swelling: STAT is pt has developed SOB within 24 hours  1. How long have you been experiencing swelling? 1week 2. Where is the swelling located?  Foot and ankle on left leg 3.  Are you currently taking a "fluid pill"? no  4.  Are you currently SOB? No, but pt is really tired 5.  Have you traveled recently?no

## 2015-03-27 ENCOUNTER — Other Ambulatory Visit: Payer: Self-pay | Admitting: Internal Medicine

## 2015-03-27 DIAGNOSIS — M9905 Segmental and somatic dysfunction of pelvic region: Secondary | ICD-10-CM | POA: Diagnosis not present

## 2015-03-27 DIAGNOSIS — M955 Acquired deformity of pelvis: Secondary | ICD-10-CM | POA: Diagnosis not present

## 2015-03-27 DIAGNOSIS — M9903 Segmental and somatic dysfunction of lumbar region: Secondary | ICD-10-CM | POA: Diagnosis not present

## 2015-03-27 DIAGNOSIS — M5416 Radiculopathy, lumbar region: Secondary | ICD-10-CM | POA: Diagnosis not present

## 2015-04-01 ENCOUNTER — Ambulatory Visit: Payer: Medicare Other

## 2015-04-02 ENCOUNTER — Ambulatory Visit: Payer: Medicare Other

## 2015-04-11 ENCOUNTER — Ambulatory Visit (INDEPENDENT_AMBULATORY_CARE_PROVIDER_SITE_OTHER): Payer: Medicare Other | Admitting: *Deleted

## 2015-04-11 DIAGNOSIS — I481 Persistent atrial fibrillation: Secondary | ICD-10-CM

## 2015-04-11 DIAGNOSIS — Z5181 Encounter for therapeutic drug level monitoring: Secondary | ICD-10-CM | POA: Diagnosis not present

## 2015-04-11 DIAGNOSIS — I4819 Other persistent atrial fibrillation: Secondary | ICD-10-CM

## 2015-04-11 LAB — POCT INR: INR: 2.3

## 2015-04-11 NOTE — Progress Notes (Signed)
Pre visit review using our clinic review tool, if applicable. No additional management support is needed unless otherwise documented below in the visit note. 

## 2015-04-19 ENCOUNTER — Encounter: Payer: Self-pay | Admitting: Internal Medicine

## 2015-04-19 ENCOUNTER — Ambulatory Visit (INDEPENDENT_AMBULATORY_CARE_PROVIDER_SITE_OTHER): Payer: Medicare Other | Admitting: Internal Medicine

## 2015-04-19 VITALS — BP 116/70 | HR 84 | Ht 65.0 in | Wt 174.2 lb

## 2015-04-19 DIAGNOSIS — I4891 Unspecified atrial fibrillation: Secondary | ICD-10-CM | POA: Diagnosis not present

## 2015-04-19 DIAGNOSIS — I4819 Other persistent atrial fibrillation: Secondary | ICD-10-CM

## 2015-04-19 DIAGNOSIS — I481 Persistent atrial fibrillation: Secondary | ICD-10-CM | POA: Diagnosis not present

## 2015-04-19 LAB — BASIC METABOLIC PANEL
BUN: 15 mg/dL (ref 7–25)
CHLORIDE: 104 mmol/L (ref 98–110)
CO2: 29 mmol/L (ref 20–31)
CREATININE: 0.74 mg/dL (ref 0.60–0.88)
Calcium: 8.8 mg/dL (ref 8.6–10.4)
GLUCOSE: 80 mg/dL (ref 65–99)
Potassium: 4.2 mmol/L (ref 3.5–5.3)
Sodium: 141 mmol/L (ref 135–146)

## 2015-04-19 LAB — TSH: TSH: 2.973 u[IU]/mL (ref 0.350–4.500)

## 2015-04-19 MED ORDER — FUROSEMIDE 40 MG PO TABS: ORAL_TABLET | ORAL | Status: DC

## 2015-04-19 NOTE — Progress Notes (Signed)
Electrophysiology Office Note   Date:  04/19/2015   ID:  [name], DOB [birth year], MRN [id number]  PCP:  [name], MD  Cardiologist:   Primary Electrophysiologist:  Virl Axe, MD    No chief complaint on file.    History of Present Illness: [name] is a 82 y.o. female seen in followup electrophysiology evaluation for atrial fibrillation.    She has hx of  atrial flutter ablation related to prior atriotomy undertaken at [facility] in March [id number] complicated by recurrent atrial arrhythmias prompting initiation of Tikosyn  This was stopped fall 2012 Recurrent symptoms prompted [name] to reinitiate Tikosyn.  Shes had recurrent episodes of atrial fibrillation associated with a rapid ventricular response.   She underwent cardioversion and reverted again to atrial fibrillation; it is now permanent She has decided to remain on warfarin  Her ejection fraction initially was about 35-40% and repeat assessment 10/14  demonstrated 55-60% with improved rate control following the initiation of digoxin.    She has been doing relatively well after we saw her last year. She did end up with a tick bite and had diffuse systemic reaction including pruritus, "joints locking down" pain aches. After number of months, the pain has finally abated; she has been left with residual stiffness. She remains with poor exercise tolerance.  She is taking care of her 32nd grandchild    Past Medical History  Diagnosis Date  . TIA (transient ischemic attack) 2010    "left facial numbness after I drank a thermos of iced coffee; not sure it was a TIA"  . Cardiomyopathy     rate-related-resolved  . Atrial fibrillation -persistent     a. s/p PVI [name] 2010;  b. on tikosyn/coumadin;  c. 05/2009 Echo: EF 60-65%, Gr 2 DD.  [name] FH: colonic polyps   . Carotid stenosis     mild (hosp 3/11)- consult by vasc/ Dr Donnetta Hutching  . Pulmonary nodule   . Depression   . Hyperlipidemia   . Syncope and collapse   . 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 Diverticulosis   . Fatty liver   . Tubular adenoma   . Complication of anesthesia     "I have trouble waking up; I'm very sensitive to anesthesia"  . Heart murmur     "prior to valve repair"  . Asthma     "brought out by smoke, air quality"  . Pneumonia 1951    "double"  . Allergic rhinitis    Past Surgical History  Procedure Laterality Date  . Mitral valve annuloplasty  10/23/1998    "Model [id number]; Seriel [id number]"; size 61mm; [facility]; Dr. Boyce Medici  . Pilonidal cyst excision  1954  . Exercise stress test  2/07  . Ct of abd and pelvis  10/07    negative  . Carotid doppler  10/07    no stenosis  . Gross hematuria on anticoagulants  10/07    neg urol work up   . Admit- syncope, bradycardia      cardiac and neuro work up  . Av node ablation      "2 @ [name]; 1 @ [place]"  . Appendectomy  1978  . Tubal ligation  1978  . Excisional hemorrhoidectomy  1990's  . Dilation and curettage of uterus       Current Outpatient Prescriptions  Medication Sig Dispense Refill  . albuterol (PROVENTIL HFA;VENTOLIN HFA) 108 (90 BASE) MCG/ACT inhaler Inhale 2 puffs into the lungs every 4 (four) hours  as needed for wheezing or shortness of breath. 1 Inhaler 0  . CARTIA XT 180 MG 24 hr capsule TAKE 1 CAPSULE EVERY DAY (NEED MD APPOINTMENT) 90 capsule 0  . chlorpheniramine (CHLOR-TRIMETON) 4 MG tablet Take 4 mg by mouth at bedtime as needed.     . digoxin (LANOXIN) 0.125 MG tablet Take 1 tablet (0.125 mg total) by mouth daily. 90 tablet 0  . levothyroxine (SYNTHROID, LEVOTHROID) 25 MCG tablet TAKE 1 TABLET (25 MCG TOTAL) BY MOUTH DAILY BEFORE BREAKFAST. 90 tablet 2  . loratadine (CLARITIN) 10 MG tablet Take 10 mg by mouth daily as needed for rhinitis.     . mometasone (ELOCON) 0.1 % cream Apply 1 application topically daily. 15 g 0  . warfarin (COUMADIN) 5 MG tablet TAKE AS DIRECTED BY ANTI-COAGULATION CLINIC 90 tablet 0   No current facility-administered medications for this  visit.    Allergies:   Amiodarone hcl; Statins; and Penicillins   Social History:  The patient  reports that she has never smoked. She has never used smokeless tobacco. She reports that she drinks alcohol. She reports that she does not use illicit drugs.   Family History:  The patient's family history includes Alcohol abuse in her father; Cancer in her father; Hypertension in her mother; Lung cancer in her father.    ROS:  Please see the history of present illness.  .   All other systems are reviewed and negative.    PHYSICAL EXAM: VS:  BP 116/70 mmHg  Pulse 84  Ht 5\' 5"  (1.651 m)  Wt 174 lb 3.2 oz (79.017 kg)  BMI 28.99 kg/m2  SpO2 98% , BMI Body mass index is 28.99 kg/(m^2). GEN: Well nourished, well developed, in no acute distress HEENT: normal Neck: JVD flat, carotid bruits, or masses Cardiac: IRR RR;  no murmur  rubs,  noS4  Respiratory:  clear to auscultation bilaterally, normal work of breathing Back without kyphosis or CVAT GI: soft, nontender, nondistended, + BS MS: no deformity or atrophy Skin: warm and dry,   Extremities No Clubbing cyanosis  Edema Neuro:  Strength and sensation are intact Psych: euthymic mood, full affect  EKG:  EKG is  ordered today. The ekg ordered today a atrial fibrillation at  80s     Recent Labs: 05/02/2014: ALT 14; BUN 28*; Creatinine, Ser 0.76; Hemoglobin 12.9; Platelets 176.0; Potassium 4.5; Sodium 139 08/02/2014: TSH 4.11    Lipid Panel     Component Value Date/Time   CHOL * 06/13/2009 0430    268        ATP III CLASSIFICATION:  <200     mg/dL   Desirable  200-239  mg/dL   Borderline High  >=240    mg/dL   High          TRIG 113 06/13/2009 0430   HDL 41 06/13/2009 0430   CHOLHDL 6.5 06/13/2009 0430   VLDL 23 06/13/2009 0430   LDLCALC * 06/13/2009 0430    204        Total Cholesterol/HDL:CHD Risk Coronary Heart Disease Risk Table                     Men   Women  1/2 Average Risk   3.4   3.3  Average Risk       5.0    4.4  2 X Average Risk   9.6   7.1  3 X Average Risk  23.4   11.0  Use the calculated Patient Ratio above and the CHD Risk Table to determine the patient's CHD Risk.        ATP III CLASSIFICATION (LDL):  <100     mg/dL   Optimal  100-129  mg/dL   Near or Above                    Optimal  130-159  mg/dL   Borderline  160-189  mg/dL   High  >190     mg/dL   Very High   LDLDIRECT 203.8 01/09/2008 0934     Wt Readings from Last 3 Encounters:  04/19/15 174 lb 3.2 oz (79.017 kg)  09/17/14 171 lb 12 oz (77.905 kg)  08/22/14 170 lb 12 oz (77.452 kg)      Other studies Reviewed: Additional studies/ records that were reviewed today include: labs as noted  Review of the above records today demonstrates:    ASSESSMENT AND PLAN: Atrial fibrillation-permanent  Bronchitis  Hypothyroidism-treated  High risk medications  Dypsnea on exertion   Post Tick bite     Digoxin level was normal at last assessment 5/15. It will need to be rechecked and BMET  With her increasing edema will undertake diurteic trial and recheck echo Will check BNP and TSH    Current medicines are reviewed at length with the patient today.   The patient does not have concerns regarding her medicines.  The following changes were made today:  none  Labs/ tests ordered today include:    No orders of the defined types were placed in this encounter.     Disposition:   FU with me 6 month(s)   Signed, Virl Axe, MD  04/19/2015 12:28 PM     [address] [place] [address] [phone] (office) [phone] (fax)

## 2015-04-19 NOTE — Patient Instructions (Signed)
Medication Instructions: Your physician has recommended you make the following change in your medication:  Start furosemide 40 mg take one-half tablet by mouth daily for 7 days, in not better increase to 1 tablet daily (call and report symptoms)  Labwork: Your physician recommends that you return for lab work today: Dig level/TSH/BMP   Procedures/Testing: Your physician has requested that you have an echocardiogram. Echocardiography is a painless test that uses sound waves to create images of your heart. It provides your doctor with information about the size and shape of your heart and how well your heart's chambers and valves are working. This procedure takes approximately one hour. There are no restrictions for this procedure.    Follow-Up: Your physician recommends that you schedule a follow-up appointment in 6 months with Dr. Caryl Comes.   Any Additional Special Instructions Will Be Listed Below (If Applicable).     If you need a refill on your cardiac medications before your next appointment, please call your pharmacy.

## 2015-04-20 LAB — DIGOXIN LEVEL: Digoxin Level: <0.5 ug/L — ABNORMAL LOW (ref 0.8–2.0)

## 2015-04-26 DIAGNOSIS — Z1151 Encounter for screening for human papillomavirus (HPV): Secondary | ICD-10-CM | POA: Diagnosis not present

## 2015-04-26 DIAGNOSIS — N644 Mastodynia: Secondary | ICD-10-CM | POA: Diagnosis not present

## 2015-04-26 DIAGNOSIS — Z01419 Encounter for gynecological examination (general) (routine) without abnormal findings: Secondary | ICD-10-CM | POA: Diagnosis not present

## 2015-05-01 ENCOUNTER — Ambulatory Visit (HOSPITAL_COMMUNITY): Payer: Medicare Other | Attending: Internal Medicine

## 2015-05-01 ENCOUNTER — Other Ambulatory Visit: Payer: Self-pay

## 2015-05-01 ENCOUNTER — Telehealth: Payer: Self-pay

## 2015-05-01 DIAGNOSIS — I4891 Unspecified atrial fibrillation: Secondary | ICD-10-CM | POA: Diagnosis present

## 2015-05-01 DIAGNOSIS — I480 Paroxysmal atrial fibrillation: Secondary | ICD-10-CM | POA: Diagnosis not present

## 2015-05-01 DIAGNOSIS — I340 Nonrheumatic mitral (valve) insufficiency: Secondary | ICD-10-CM | POA: Diagnosis not present

## 2015-05-01 DIAGNOSIS — I517 Cardiomegaly: Secondary | ICD-10-CM | POA: Diagnosis not present

## 2015-05-01 DIAGNOSIS — I4819 Other persistent atrial fibrillation: Secondary | ICD-10-CM

## 2015-05-01 DIAGNOSIS — I481 Persistent atrial fibrillation: Secondary | ICD-10-CM | POA: Diagnosis not present

## 2015-05-01 MED ORDER — DIGOXIN 125 MCG PO TABS: 0.2500 mg | ORAL_TABLET | Freq: Every day | ORAL | Status: DC

## 2015-05-01 NOTE — Telephone Encounter (Signed)
-----   Message from Deboraha Sprang, MD sent at 04/24/2015  9:58 PM EST ----- Please Inform Patient that labs are normal x dig level is a little low   Please have her increase her dig dose 0.25  And recheck in 4 weeks Thanks

## 2015-05-01 NOTE — Telephone Encounter (Signed)
Patient walked in for results. Informed patient of results and verbal understanding expressed.   Instructed patient to INCREASE DIG to 0.25 mg. Repeat lab work scheduled for 3/8. Patient agrees with treatment plan.

## 2015-05-02 ENCOUNTER — Other Ambulatory Visit: Payer: Self-pay | Admitting: Unknown Physician Specialty

## 2015-05-02 ENCOUNTER — Other Ambulatory Visit: Payer: Self-pay | Admitting: Family Medicine

## 2015-05-02 DIAGNOSIS — N644 Mastodynia: Secondary | ICD-10-CM

## 2015-05-02 NOTE — Telephone Encounter (Signed)
Rout to [facility] 

## 2015-05-06 ENCOUNTER — Telehealth: Payer: Self-pay | Admitting: Internal Medicine

## 2015-05-06 NOTE — Telephone Encounter (Signed)
The patient is aware of her results.  

## 2015-05-06 NOTE — Telephone Encounter (Signed)
Pt had Echo done 05-01-15 and would like to see if results are available? pls call [phone]

## 2015-05-09 ENCOUNTER — Other Ambulatory Visit (INDEPENDENT_AMBULATORY_CARE_PROVIDER_SITE_OTHER): Payer: Medicare Other

## 2015-05-09 DIAGNOSIS — I481 Persistent atrial fibrillation: Secondary | ICD-10-CM | POA: Diagnosis not present

## 2015-05-09 DIAGNOSIS — Z5181 Encounter for therapeutic drug level monitoring: Secondary | ICD-10-CM

## 2015-05-09 DIAGNOSIS — I4819 Other persistent atrial fibrillation: Secondary | ICD-10-CM

## 2015-05-09 LAB — POCT INR: INR: 2.7

## 2015-05-10 ENCOUNTER — Ambulatory Visit (INDEPENDENT_AMBULATORY_CARE_PROVIDER_SITE_OTHER): Payer: Medicare Other | Admitting: *Deleted

## 2015-05-10 DIAGNOSIS — I4891 Unspecified atrial fibrillation: Secondary | ICD-10-CM

## 2015-05-10 DIAGNOSIS — Z5181 Encounter for therapeutic drug level monitoring: Secondary | ICD-10-CM

## 2015-05-10 NOTE — Progress Notes (Signed)
Pre visit review using our clinic review tool, if applicable. No additional management support is needed unless otherwise documented below in the visit note. 

## 2015-05-13 ENCOUNTER — Other Ambulatory Visit: Payer: Self-pay | Admitting: *Deleted

## 2015-05-13 ENCOUNTER — Ambulatory Visit: Payer: Medicare Other | Admitting: Internal Medicine

## 2015-05-13 ENCOUNTER — Inpatient Hospital Stay
Admission: RE | Admit: 2015-05-13 | Discharge: 2015-05-13 | Disposition: A | Discharge status: Home / Self Care | Payer: Self-pay | Source: Ambulatory Visit | Attending: *Deleted | Admitting: *Deleted

## 2015-05-13 DIAGNOSIS — M9905 Segmental and somatic dysfunction of pelvic region: Secondary | ICD-10-CM | POA: Diagnosis not present

## 2015-05-13 DIAGNOSIS — Z9289 Personal history of other medical treatment: Secondary | ICD-10-CM

## 2015-05-13 DIAGNOSIS — M9903 Segmental and somatic dysfunction of lumbar region: Secondary | ICD-10-CM | POA: Diagnosis not present

## 2015-05-13 DIAGNOSIS — M955 Acquired deformity of pelvis: Secondary | ICD-10-CM | POA: Diagnosis not present

## 2015-05-13 DIAGNOSIS — M5416 Radiculopathy, lumbar region: Secondary | ICD-10-CM | POA: Diagnosis not present

## 2015-05-15 ENCOUNTER — Encounter: Payer: Self-pay | Admitting: Internal Medicine

## 2015-05-15 ENCOUNTER — Ambulatory Visit (INDEPENDENT_AMBULATORY_CARE_PROVIDER_SITE_OTHER): Payer: Medicare Other | Admitting: Internal Medicine

## 2015-05-15 VITALS — BP 134/81 | HR 89 | Temp 97.7°F | Ht 65.0 in | Wt 175.0 lb

## 2015-05-15 DIAGNOSIS — T148 Other injury of unspecified body region: Secondary | ICD-10-CM

## 2015-05-15 DIAGNOSIS — M129 Arthropathy, unspecified: Secondary | ICD-10-CM | POA: Diagnosis not present

## 2015-05-15 DIAGNOSIS — W57XXXA Bitten or stung by nonvenomous insect and other nonvenomous arthropods, initial encounter: Secondary | ICD-10-CM

## 2015-05-15 DIAGNOSIS — M1990 Unspecified osteoarthritis, unspecified site: Secondary | ICD-10-CM | POA: Diagnosis not present

## 2015-05-15 DIAGNOSIS — L500 Allergic urticaria: Secondary | ICD-10-CM | POA: Diagnosis not present

## 2015-05-15 LAB — COMPLETE METABOLIC PANEL WITH GFR
ALBUMIN: 4.2 g/dL (ref 3.6–5.1)
ALK PHOS: 46 U/L (ref 33–130)
ALT: 11 U/L (ref 6–29)
AST: 17 U/L (ref 10–35)
BILIRUBIN TOTAL: 0.6 mg/dL (ref 0.2–1.2)
BUN: 16 mg/dL (ref 7–25)
CALCIUM: 9.3 mg/dL (ref 8.6–10.4)
CO2: 29 mmol/L (ref 20–31)
Chloride: 100 mmol/L (ref 98–110)
Creat: 0.8 mg/dL (ref 0.60–0.88)
GFR, EST AFRICAN AMERICAN: 81 mL/min (ref >=60)
GFR, EST NON AFRICAN AMERICAN: 70 mL/min (ref >=60)
Glucose, Bld: 99 mg/dL (ref 65–99)
POTASSIUM: 4.5 mmol/L (ref 3.5–5.3)
Sodium: 139 mmol/L (ref 135–146)
TOTAL PROTEIN: 7.2 g/dL (ref 6.1–8.1)

## 2015-05-15 LAB — CBC WITH DIFFERENTIAL/PLATELET
BASOS ABS: 0 10*3/uL (ref 0.0–0.1)
Basophils Relative: 1 % (ref 0–1)
EOS ABS: 0.2 10*3/uL (ref 0.0–0.7)
EOS PCT: 4 % (ref 0–5)
HEMATOCRIT: 39.5 % (ref 36.0–46.0)
Hemoglobin: 13.3 g/dL (ref 12.0–15.0)
LYMPHS ABS: 1.2 10*3/uL (ref 0.7–4.0)
LYMPHS PCT: 32 % (ref 12–46)
MCH: 28.3 pg (ref 26.0–34.0)
MCHC: 33.7 g/dL (ref 30.0–36.0)
MCV: 84 fL (ref 78.0–100.0)
MONO ABS: 0.4 10*3/uL (ref 0.1–1.0)
MONOS PCT: 10 % (ref 3–12)
MPV: 11.9 fL (ref 8.6–12.4)
Neutro Abs: 2.1 10*3/uL (ref 1.7–7.7)
Neutrophils Relative %: 53 % (ref 43–77)
Platelets: 165 10*3/uL (ref 150–400)
RBC: 4.7 MIL/uL (ref 3.87–5.11)
RDW: 14.6 % (ref 11.5–15.5)
WBC: 3.9 10*3/uL — ABNORMAL LOW (ref 4.0–10.5)

## 2015-05-15 LAB — C-REACTIVE PROTEIN

## 2015-05-15 LAB — SEDIMENTATION RATE: Sed Rate: 30 mm/hr (ref 0–30)

## 2015-05-15 NOTE — Progress Notes (Signed)
Rfv: tick bite Subjective:    Patient ID: [name], female    DOB: [birth year], 82 y.o.   MRN: [id number]  HPI  82 yo F  With history of hypothyroidism, hld, atrial fibrillation recalls having a tick bite roughly 1 year ago. Around right groin. Rash started 72 hr after bite. Her pcp gave her a round of abtx, but blood work has not have shown any infection infection. She noticed later than spring-summer in April- October, had significant arthralgia that improved spontaneously. - pain with shoulder/arms/ hips mostly, had difficulty with walking. Didn't see pcp during this time. It was accompanied by severe lethargy. She states that she felt improved, though not back herself in comparison to a year ago. until last week, when she felt that she was getting stiff but not concerning now. She is having severe itching at night recently.  Also in the last 2 weeks, noticed having rash underneath right breast as well to ears, and neck.Also has itching eyes for which she treats with eye drops   Allergies  Allergen Reactions  . Amiodarone Hcl Swelling  . Statins     REACTION: rash  . Penicillins Rash    REACTION: rash   Current Outpatient Prescriptions on File Prior to Visit  Medication Sig Dispense Refill  . albuterol (PROVENTIL HFA;VENTOLIN HFA) 108 (90 BASE) MCG/ACT inhaler Inhale 2 puffs into the lungs every 4 (four) hours as needed for wheezing or shortness of breath. 1 Inhaler 0  . CARTIA XT 180 MG 24 hr capsule TAKE 1 CAPSULE EVERY DAY (NEED MD APPOINTMENT) 90 capsule 0  . digoxin (LANOXIN) 0.125 MG tablet Take 2 tablets (0.25 mg total) by mouth daily.    . furosemide (LASIX) 40 MG tablet Take one-half tablet by mouth daily may, increase to one tablet by mouth daily if needed 90 tablet 3  . levothyroxine (SYNTHROID, LEVOTHROID) 25 MCG tablet TAKE 1 TABLET (25 MCG TOTAL) BY MOUTH DAILY BEFORE BREAKFAST. 90 tablet 2  . loratadine (CLARITIN) 10 MG tablet Take 10 mg by mouth daily as needed for  rhinitis.     [name] warfarin (COUMADIN) 5 MG tablet TAKE AS DIRECTED BY ANTI-COAGULATION CLINIC 90 tablet 0   No current facility-administered medications on file prior to visit.   Active Ambulatory Problems    Diagnosis Date Noted  . ADENOMATOUS COLONIC POLYP 11/04/2006  . Hypothyroidism 09/02/2006  . HYPERLIPIDEMIA 04/27/2007  . OBESITY 06/09/2007  . DEPRESSION 04/27/2007  . Atrial fibrillation ([place]) 08/05/2006  . Asthma, mild intermittent 04/27/2007  . PULMONARY NODULE 12/20/2008  . GANGLION CYST 10/04/2007  . INSOMNIA 04/27/2007  . ABDOMINAL PAIN 08/27/2009  . COLONIC POLYPS, ADENOMATOUS, HX OF 09/18/2009  . Cardiomyopathy, secondary --Resolved again 10/14 10/13/2010  . (HFpEF) heart failure with preserved ejection fraction ([place]) 12/27/2012  . Left ovarian cyst 03/14/2013  . Chest wall contusion 03/14/2013  . Encounter for therapeutic drug monitoring 04/20/2013  . Caregiver stress 08/16/2013  . Colon cancer screening 08/16/2013  . Fatigue 08/16/2013  . Viral URI with cough 01/24/2014  . Acute bronchitis with bronchospasm 04/09/2014  . Blood in stool 04/09/2014  . Abdominal muscle pain 04/16/2014  . Accidental overdose 04/26/2014  . Drug overdose 04/26/2014  . Chronic cough 05/08/2014  . Hip pain 08/02/2014  . Left knee pain 08/02/2014  . Tick bite 08/22/2014  . Urticaria 08/22/2014   Resolved Ambulatory Problems    Diagnosis Date Noted  . URI 04/16/2008  . ALLERGIC RHINITIS 04/27/2007  . RECTAL BLEEDING 09/02/2006  .  MENOPAUSE-RELATED VASOMOTOR SYMPTOMS 04/28/2007  . NECK PAIN 04/28/2007  . URINARY INCONTINENCE 04/28/2007  . Sore throat 01/21/2011  . Multiple URI 05/25/2011  . Post-nasal drip 09/07/2011  . URI (upper respiratory infection) 09/07/2011  . Neck muscle spasm 01/15/2012  . Hematuria, gross 03/08/2012  . Palpitations 04/15/2012   Past Medical History  Diagnosis Date  . TIA (transient ischemic attack) 2010  . Cardiomyopathy   . FH: colonic polyps     . Carotid stenosis   . Pulmonary nodule   . Depression   . Hyperlipidemia   . Syncope and collapse   . 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 Diverticulosis   . Fatty liver   . Tubular adenoma   . Complication of anesthesia   . Heart murmur   . Asthma   . Pneumonia 1951  . Allergic rhinitis    Social History   Social History  . Marital Status: Widowed    Spouse Name: N/A  . Number of Children: 6  . Years of Education: N/A   Occupational History  . realtor    Social History Main Topics  . Smoking status: Never Smoker   . Smokeless tobacco: Never Used  . Alcohol Use: 0.0 oz/week    0 Standard drinks or equivalent per week     Comment: 04/26/2014 "occasional glass of wine; a few times/yr"  . Drug Use: No  . Sexual Activity: No   Other Topics Concern  . Not on file   Social History Narrative   Retired. Daily Caffeine use: 2 daily    family history includes Alcohol abuse in her father; Cancer in her father; Hypertension in her mother; Lung cancer in her father.  Review of Systems Review of Systems  Constitutional: Negative for fever, chills, diaphoresis, activity change, appetite change, fatigue and unexpected weight change.  HENT: Negative for congestion, sore throat, rhinorrhea, sneezing, trouble swallowing and sinus pressure.  Eyes: Negative for photophobia and visual disturbance.  Respiratory: Negative for cough, chest tightness, shortness of breath, wheezing and stridor.  Cardiovascular: Negative for chest pain, palpitations and leg swelling.  Gastrointestinal: Negative for nausea, vomiting, abdominal pain, diarrhea, constipation, blood in stool, abdominal distention and anal bleeding.  Genitourinary: Negative for dysuria, hematuria, flank pain and difficulty urinating.  Musculoskeletal: Negative for myalgias, back pain, joint swelling, arthralgias and gait problem.  Skin: Negative for color change, pallor, rash and wound.  Neurological: Negative for dizziness, tremors,  weakness and light-headedness.  Hematological: Negative for adenopathy. Does not bruise/bleed easily.  Psychiatric/Behavioral: Negative for behavioral problems, confusion, sleep disturbance, dysphoric mood, decreased concentration and agitation.       Objective:   Physical Exam BP 134/81 mmHg  Pulse 89  Temp(Src) 97.7 F (36.5 C) (Oral)  Ht 5\' 5"  (1.651 m)  Wt 175 lb (79.379 kg)  BMI 29.12 kg/m2 Physical Exam  Constitutional:  oriented to person, place, and time. appears well-developed and well-nourished. No distress.  HENT: [place]/AT, PERRLA, no scleral icterus Mouth/Throat: Oropharynx is clear and moist. No oropharyngeal exudate.  Cardiovascular: Normal rate, regular rhythm and normal heart sounds. Exam reveals no gallop and no friction rub.  No murmur heard.  Pulmonary/Chest: Effort normal and breath sounds normal. No respiratory distress.  has no wheezes.  Neck = supple, no nuchal rigidity Abdominal: Soft. Bowel sounds are normal.  exhibits no distension. There is no tenderness.  Lymphadenopathy: no cervical adenopathy. No axillary adenopathy Neurological: alert and oriented to person, place, and time.  Skin: Skin is warm and dry.  No rash noted. No erythema.  Psychiatric: a normal mood and affect.  behavior is normal.   Lab Results  Component Value Date   ESRSEDRATE 30 05/15/2015   Lab Results  Component Value Date   CRP <0.5 05/15/2015   CMP     Component Value Date/Time   NA 139 05/15/2015 0959   NA 139 06/26/2013 2220   NA 143 02/23/2013 0937   K 4.5 05/15/2015 0959   K 3.5 06/26/2013 2220   CL 100 05/15/2015 0959   CL 106 06/26/2013 2220   CO2 29 05/15/2015 0959   CO2 27 06/26/2013 2220   GLUCOSE 99 05/15/2015 0959   GLUCOSE 136* 06/26/2013 2220   GLUCOSE 85 02/23/2013 0937   BUN 16 05/15/2015 0959   BUN 14 06/26/2013 2220   BUN 21 02/23/2013 0937   CREATININE 0.80 05/15/2015 0959   CREATININE 0.76 05/02/2014 1249   CREATININE 0.83 06/26/2013 2220    CALCIUM 9.3 05/15/2015 0959   CALCIUM 8.3* 06/26/2013 2220   PROT 7.2 05/15/2015 0959   PROT 7.1 02/28/2013 1110   ALBUMIN 4.2 05/15/2015 0959   ALBUMIN 3.6 02/28/2013 1110   AST 17 05/15/2015 0959   AST 25 02/28/2013 1110   ALT 11 05/15/2015 0959   ALT 19 02/28/2013 1110   ALKPHOS 46 05/15/2015 0959   ALKPHOS 48 02/28/2013 1110   BILITOT 0.6 05/15/2015 0959   BILITOT 0.6 02/28/2013 1110   GFRNONAA 70 05/15/2015 0959   GFRNONAA 77* 04/27/2014 0520   GFRNONAA >60 06/26/2013 2220   GFRAA 81 05/15/2015 0959   GFRAA 90* 04/27/2014 0520   GFRAA >60 06/26/2013 2220     I have reviewed her outside records      Assessment & Plan:  History of joint pain, large joints with fatigue in elderly makes me consider pmr as part of her her diagnosis, but appears resolved = will get sed rate and crp, listed above are normal.  Urticaria = will refer back to dr. Allyson Sabal to see if can provide relief. wil check cmp to see if lfts, bili, alkphos are WNL.  Hx of tickbite = she had been given course of tx. ig g negative for RMSF, would not recommend further treatment. i do not feel her current urticaria is related to any RMSF or lyme disease.

## 2015-05-24 ENCOUNTER — Other Ambulatory Visit: Payer: Self-pay | Admitting: Unknown Physician Specialty

## 2015-05-24 ENCOUNTER — Ambulatory Visit
Admission: RE | Admit: 2015-05-24 | Discharge: 2015-05-24 | Disposition: A | Discharge status: Home / Self Care | Payer: Medicare Other | Source: Ambulatory Visit | Attending: Unknown Physician Specialty | Admitting: Unknown Physician Specialty

## 2015-05-24 DIAGNOSIS — N644 Mastodynia: Secondary | ICD-10-CM | POA: Diagnosis not present

## 2015-05-27 ENCOUNTER — Other Ambulatory Visit: Payer: Self-pay | Admitting: Internal Medicine

## 2015-05-28 ENCOUNTER — Other Ambulatory Visit: Payer: Self-pay

## 2015-05-28 MED ORDER — LEVOTHYROXINE SODIUM 25 MCG PO TABS: ORAL_TABLET | ORAL | Status: DC

## 2015-05-28 NOTE — Telephone Encounter (Signed)
./  Pt said [employer] has no more refills on levothyroxine and pt request refill to [facility]. Pt will cb for f/u appt. Advised pt done per protocol. Last TSH done for [facility] 08/02/14; last TSH for another site 01/

## 2015-05-29 ENCOUNTER — Other Ambulatory Visit (INDEPENDENT_AMBULATORY_CARE_PROVIDER_SITE_OTHER): Payer: Medicare Other | Admitting: *Deleted

## 2015-05-29 DIAGNOSIS — I4891 Unspecified atrial fibrillation: Secondary | ICD-10-CM

## 2015-05-29 LAB — DIGOXIN LEVEL: DIGOXIN LVL: 1.1 ug/L (ref 0.8–2.0)

## 2015-06-04 NOTE — Progress Notes (Signed)
Let s confirm  According to our last note, the [facility] says 0.125 not 0.25 Thanks   In either case we want to cut the dose in half

## 2015-06-13 ENCOUNTER — Ambulatory Visit (INDEPENDENT_AMBULATORY_CARE_PROVIDER_SITE_OTHER): Payer: Medicare Other | Admitting: *Deleted

## 2015-06-13 DIAGNOSIS — I481 Persistent atrial fibrillation: Secondary | ICD-10-CM | POA: Diagnosis not present

## 2015-06-13 DIAGNOSIS — I4891 Unspecified atrial fibrillation: Secondary | ICD-10-CM | POA: Diagnosis not present

## 2015-06-13 DIAGNOSIS — I4819 Other persistent atrial fibrillation: Secondary | ICD-10-CM

## 2015-06-13 DIAGNOSIS — Z5181 Encounter for therapeutic drug level monitoring: Secondary | ICD-10-CM

## 2015-06-13 LAB — POCT INR: INR: 2.85

## 2015-06-13 NOTE — Progress Notes (Signed)
Pre visit review using our clinic review tool, if applicable. No additional management support is needed unless otherwise documented below in the visit note. 

## 2015-06-25 DIAGNOSIS — M9903 Segmental and somatic dysfunction of lumbar region: Secondary | ICD-10-CM | POA: Diagnosis not present

## 2015-06-25 DIAGNOSIS — M5416 Radiculopathy, lumbar region: Secondary | ICD-10-CM | POA: Diagnosis not present

## 2015-06-25 DIAGNOSIS — M9905 Segmental and somatic dysfunction of pelvic region: Secondary | ICD-10-CM | POA: Diagnosis not present

## 2015-06-25 DIAGNOSIS — M955 Acquired deformity of pelvis: Secondary | ICD-10-CM | POA: Diagnosis not present

## 2015-07-05 ENCOUNTER — Encounter: Payer: Self-pay | Admitting: *Deleted

## 2015-07-05 ENCOUNTER — Other Ambulatory Visit: Payer: Self-pay | Admitting: *Deleted

## 2015-07-08 ENCOUNTER — Other Ambulatory Visit: Payer: Self-pay

## 2015-07-08 ENCOUNTER — Telehealth: Payer: Self-pay | Admitting: Gastroenterology

## 2015-07-08 MED ORDER — HYDROCORTISONE ACETATE 25 MG RE SUPP: 25.0000 mg | Freq: Two times a day (BID) | RECTAL | Status: DC

## 2015-07-08 NOTE — Telephone Encounter (Signed)
Advised the Rx was resubmitted.

## 2015-07-11 ENCOUNTER — Ambulatory Visit (INDEPENDENT_AMBULATORY_CARE_PROVIDER_SITE_OTHER): Payer: Medicare Other | Admitting: *Deleted

## 2015-07-11 DIAGNOSIS — M5416 Radiculopathy, lumbar region: Secondary | ICD-10-CM | POA: Diagnosis not present

## 2015-07-11 DIAGNOSIS — I481 Persistent atrial fibrillation: Secondary | ICD-10-CM | POA: Diagnosis not present

## 2015-07-11 DIAGNOSIS — Z5181 Encounter for therapeutic drug level monitoring: Secondary | ICD-10-CM | POA: Diagnosis not present

## 2015-07-11 DIAGNOSIS — M9903 Segmental and somatic dysfunction of lumbar region: Secondary | ICD-10-CM | POA: Diagnosis not present

## 2015-07-11 DIAGNOSIS — I4891 Unspecified atrial fibrillation: Secondary | ICD-10-CM

## 2015-07-11 DIAGNOSIS — M955 Acquired deformity of pelvis: Secondary | ICD-10-CM | POA: Diagnosis not present

## 2015-07-11 DIAGNOSIS — I4819 Other persistent atrial fibrillation: Secondary | ICD-10-CM

## 2015-07-11 DIAGNOSIS — M9905 Segmental and somatic dysfunction of pelvic region: Secondary | ICD-10-CM | POA: Diagnosis not present

## 2015-07-11 LAB — POCT INR: INR: 1.5

## 2015-07-11 NOTE — Progress Notes (Signed)
INR is sub therapeutic today.  Patient denies any missed doses or new medications.  She has been eating greens more frequently to work on weight loss.  Patient prefers to increase dose rather than change diet.  We discussed the importance of consistency in diet.  Patient instructed to boost with 1.5 tablets today, then begin taking 1 tablet daily except half a tablet on Mondays only.  She will notify our clinic of any future diet changes.  Will recheck in 2-3 weeks and make additional adjustments at that time if needed.

## 2015-07-11 NOTE — Progress Notes (Signed)
Pre visit review using our clinic review tool, if applicable. No additional management support is needed unless otherwise documented below in the visit note. 

## 2015-08-01 ENCOUNTER — Telehealth: Payer: Self-pay | Admitting: Family Medicine

## 2015-08-01 ENCOUNTER — Ambulatory Visit (INDEPENDENT_AMBULATORY_CARE_PROVIDER_SITE_OTHER): Payer: Medicare Other | Admitting: *Deleted

## 2015-08-01 ENCOUNTER — Ambulatory Visit: Payer: Medicare Other

## 2015-08-01 DIAGNOSIS — Z5181 Encounter for therapeutic drug level monitoring: Secondary | ICD-10-CM

## 2015-08-01 DIAGNOSIS — I4891 Unspecified atrial fibrillation: Secondary | ICD-10-CM | POA: Diagnosis not present

## 2015-08-01 DIAGNOSIS — I481 Persistent atrial fibrillation: Secondary | ICD-10-CM | POA: Diagnosis not present

## 2015-08-01 DIAGNOSIS — I4819 Other persistent atrial fibrillation: Secondary | ICD-10-CM

## 2015-08-01 LAB — POCT INR: INR: 2.1

## 2015-08-01 NOTE — Progress Notes (Signed)
Pre visit review using our clinic review tool, if applicable. No additional management support is needed unless otherwise documented below in the visit note. 

## 2015-08-01 NOTE — Telephone Encounter (Signed)
Pt came in later in day

## 2015-08-01 NOTE — Telephone Encounter (Signed)
Patient called back for follow up today.

## 2015-08-01 NOTE — Telephone Encounter (Signed)
Patient did not come for their scheduled appointment today for pt/inr  Please let me know if the patient needs to be contacted immediately for follow up or if no follow up is necessary.   ° °

## 2015-08-01 NOTE — Telephone Encounter (Signed)
Patient needs follow up next week if possible.

## 2015-08-05 ENCOUNTER — Other Ambulatory Visit: Payer: Self-pay | Admitting: Internal Medicine

## 2015-08-05 NOTE — Telephone Encounter (Signed)
REFILL 

## 2015-08-26 DIAGNOSIS — M955 Acquired deformity of pelvis: Secondary | ICD-10-CM | POA: Diagnosis not present

## 2015-08-26 DIAGNOSIS — M9903 Segmental and somatic dysfunction of lumbar region: Secondary | ICD-10-CM | POA: Diagnosis not present

## 2015-08-26 DIAGNOSIS — M9905 Segmental and somatic dysfunction of pelvic region: Secondary | ICD-10-CM | POA: Diagnosis not present

## 2015-08-26 DIAGNOSIS — M5416 Radiculopathy, lumbar region: Secondary | ICD-10-CM | POA: Diagnosis not present

## 2015-08-27 DIAGNOSIS — L821 Other seborrheic keratosis: Secondary | ICD-10-CM | POA: Diagnosis not present

## 2015-08-27 DIAGNOSIS — L309 Dermatitis, unspecified: Secondary | ICD-10-CM | POA: Diagnosis not present

## 2015-08-27 DIAGNOSIS — D485 Neoplasm of uncertain behavior of skin: Secondary | ICD-10-CM | POA: Diagnosis not present

## 2015-08-27 DIAGNOSIS — C44321 Squamous cell carcinoma of skin of nose: Secondary | ICD-10-CM | POA: Diagnosis not present

## 2015-08-29 ENCOUNTER — Ambulatory Visit (INDEPENDENT_AMBULATORY_CARE_PROVIDER_SITE_OTHER): Payer: Medicare Other | Admitting: *Deleted

## 2015-08-29 DIAGNOSIS — Z5181 Encounter for therapeutic drug level monitoring: Secondary | ICD-10-CM

## 2015-08-29 DIAGNOSIS — I4891 Unspecified atrial fibrillation: Secondary | ICD-10-CM | POA: Diagnosis not present

## 2015-08-29 LAB — POCT INR: INR: 2.6

## 2015-08-29 NOTE — Progress Notes (Signed)
Pre visit review using our clinic review tool, if applicable. No additional management support is needed unless otherwise documented below in the visit note. 

## 2015-09-05 ENCOUNTER — Other Ambulatory Visit: Payer: Self-pay | Admitting: Internal Medicine

## 2015-09-17 DIAGNOSIS — H25813 Combined forms of age-related cataract, bilateral: Secondary | ICD-10-CM | POA: Diagnosis not present

## 2015-09-17 DIAGNOSIS — D2312 Other benign neoplasm of skin of left eyelid, including canthus: Secondary | ICD-10-CM | POA: Diagnosis not present

## 2015-09-17 DIAGNOSIS — D2311 Other benign neoplasm of skin of right eyelid, including canthus: Secondary | ICD-10-CM | POA: Diagnosis not present

## 2015-09-17 DIAGNOSIS — H524 Presbyopia: Secondary | ICD-10-CM | POA: Diagnosis not present

## 2015-09-20 DIAGNOSIS — M955 Acquired deformity of pelvis: Secondary | ICD-10-CM | POA: Diagnosis not present

## 2015-09-20 DIAGNOSIS — M9903 Segmental and somatic dysfunction of lumbar region: Secondary | ICD-10-CM | POA: Diagnosis not present

## 2015-09-20 DIAGNOSIS — M9905 Segmental and somatic dysfunction of pelvic region: Secondary | ICD-10-CM | POA: Diagnosis not present

## 2015-09-20 DIAGNOSIS — M5416 Radiculopathy, lumbar region: Secondary | ICD-10-CM | POA: Diagnosis not present

## 2015-09-26 ENCOUNTER — Other Ambulatory Visit (INDEPENDENT_AMBULATORY_CARE_PROVIDER_SITE_OTHER): Payer: Medicare Other

## 2015-09-26 DIAGNOSIS — I481 Persistent atrial fibrillation: Secondary | ICD-10-CM | POA: Diagnosis not present

## 2015-09-26 DIAGNOSIS — Z5181 Encounter for therapeutic drug level monitoring: Secondary | ICD-10-CM

## 2015-09-26 DIAGNOSIS — I4819 Other persistent atrial fibrillation: Secondary | ICD-10-CM

## 2015-09-26 LAB — POCT INR: INR: 2.8

## 2015-10-01 ENCOUNTER — Other Ambulatory Visit: Payer: Self-pay | Admitting: Family Medicine

## 2015-10-04 ENCOUNTER — Ambulatory Visit (INDEPENDENT_AMBULATORY_CARE_PROVIDER_SITE_OTHER): Payer: Medicare Other | Admitting: Family Medicine

## 2015-10-04 ENCOUNTER — Encounter: Payer: Self-pay | Admitting: Family Medicine

## 2015-10-04 VITALS — BP 98/62 | HR 79 | Temp 97.7°F | Ht 65.0 in | Wt 159.5 lb

## 2015-10-04 DIAGNOSIS — L299 Pruritus, unspecified: Secondary | ICD-10-CM | POA: Diagnosis not present

## 2015-10-04 DIAGNOSIS — W57XXXA Bitten or stung by nonvenomous insect and other nonvenomous arthropods, initial encounter: Secondary | ICD-10-CM

## 2015-10-04 DIAGNOSIS — T148 Other injury of unspecified body region: Secondary | ICD-10-CM

## 2015-10-04 NOTE — Progress Notes (Signed)
Pre visit review using our clinic review tool, if applicable. No additional management support is needed unless otherwise documented below in the visit note. 

## 2015-10-04 NOTE — Patient Instructions (Signed)
Take your claritin every day 10 mg at night  I used a freezing spray on the area of tick bite- let it heal and keep it clean with soap and water  Keep follow up with dermatology / also eye doctor  Keep me posted

## 2015-10-04 NOTE — Progress Notes (Signed)
Subjective:    Patient ID: [name], female    DOB: [birth year], 82 y.o.   MRN: [id number]  HPI Here for concerns of tick bite Happened 18 mo ago (broke her out in a rash)--R groin area  It is still bothering her  Has a deep itching sensation She packs it ice every night- the spot is still there When that itches-then she itches all over  She has used otc meds like compound W - to "burn this thing out"  Having some eye issues - little bumps "going into her eyes"  Saw derm recently and had 2 skin cancers treated -on bridge of nose Vision is ok     Test for RMSF IGg was neg 6/16 Test for lyme all neg 6/16 and 8/16 Pt states that after the tick bite her joints got stiff - got better in the fall    Wt Readings from Last 3 Encounters:  10/04/15 159 lb 8 oz (72.349 kg)  05/15/15 175 lb (79.379 kg)  04/19/15 174 lb 3.2 oz (79.017 kg)   bmi of 26 Has lost significant wt since Feb- she cut out sweets and bread (no bad carbs)  Lean protein and green vegetables   Patient Active Problem List   Diagnosis Date Noted  . Itching 10/04/2015  . Tick bite 08/22/2014  . Urticaria 08/22/2014  . Hip pain 08/02/2014  . Left knee pain 08/02/2014  . Chronic cough 05/08/2014  . Accidental overdose 04/26/2014  . Drug overdose 04/26/2014  . Abdominal muscle pain 04/16/2014  . Acute bronchitis with bronchospasm 04/09/2014  . Blood in stool 04/09/2014  . Caregiver stress 08/16/2013  . Colon cancer screening 08/16/2013  . Fatigue 08/16/2013  . Encounter for therapeutic drug monitoring 04/20/2013  . Left ovarian cyst 03/14/2013  . Chest wall contusion 03/14/2013  . (HFpEF) heart failure with preserved ejection fraction ([place]) 12/27/2012  . Cardiomyopathy, secondary --Resolved again 10/14 10/13/2010  . COLONIC POLYPS, ADENOMATOUS, HX OF 09/18/2009  . ABDOMINAL PAIN 08/27/2009  . PULMONARY NODULE 12/20/2008  . GANGLION CYST 10/04/2007  . OBESITY 06/09/2007  . HYPERLIPIDEMIA 04/27/2007  .  DEPRESSION 04/27/2007  . Asthma, mild intermittent 04/27/2007  . INSOMNIA 04/27/2007  . ADENOMATOUS COLONIC POLYP 11/04/2006  . Hypothyroidism 09/02/2006  . Atrial fibrillation ([place]) 08/05/2006   Past Medical History  Diagnosis Date  . TIA (transient ischemic attack) 2010    "left facial numbness after I drank a thermos of iced coffee; not sure it was a TIA"  . Cardiomyopathy     rate-related-resolved  . Atrial fibrillation -persistent     a. s/p PVI [name] 2010;  b. on tikosyn/coumadin;  c. 05/2009 Echo: EF 60-65%, Gr 2 DD.  [name] FH: colonic polyps   . Carotid stenosis     mild (hosp 3/11)- consult by vasc/ Dr Donnetta Hutching  . Pulmonary nodule   . Depression   . Hyperlipidemia   . Syncope and collapse   . 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 Diverticulosis   . Fatty liver   . Tubular adenoma   . Complication of anesthesia     "I have trouble waking up; I'm very sensitive to anesthesia"  . Heart murmur     "prior to valve repair"  . Asthma     "brought out by smoke, air quality"  . Pneumonia 1951    "double"  . Allergic rhinitis    Past Surgical History  Procedure Laterality Date  . Mitral valve annuloplasty  10/23/1998    "  Model [id number]; [place] [id number]"; size 54mm; [facility]; Dr. Boyce Medici  . Pilonidal cyst excision  1954  . Exercise stress test  2/07  . Ct of abd and pelvis  10/07    negative  . Carotid doppler  10/07    no stenosis  . Gross hematuria on anticoagulants  10/07    neg urol work up   . Admit- syncope, bradycardia      cardiac and neuro work up  . Av node ablation      "2 @ [name]; 1 @ [place]"  . Appendectomy  1978  . Tubal ligation  1978  . Excisional hemorrhoidectomy  1990's  . Dilation and curettage of uterus     Social History  Substance Use Topics  . Smoking status: Never Smoker   . Smokeless tobacco: Never Used  . Alcohol Use: 0.0 oz/week    0 Standard drinks or equivalent per week     Comment: 04/26/2014 "occasional glass of wine; a few times/yr"    Family History  Problem Relation Age of Onset  . Hypertension Mother   . Lung cancer Father     smoker  . Alcohol abuse Father   . Cancer Father     bladder and lung CA smoker  . Breast cancer Neg Hx    Allergies  Allergen Reactions  . Amiodarone Hcl Swelling  . Statins     REACTION: rash  . Penicillins Rash    REACTION: rash   Current Outpatient Prescriptions on File Prior to Visit  Medication Sig Dispense Refill  . albuterol (PROVENTIL HFA;VENTOLIN HFA) 108 (90 BASE) MCG/ACT inhaler Inhale 2 puffs into the lungs every 4 (four) hours as needed for wheezing or shortness of breath. 1 Inhaler 0  . digoxin (LANOXIN) 0.125 MG tablet TAKE 1 TABLET ONE TIME DAILY 90 tablet 0  . diltiazem (CARTIA XT) 180 MG 24 hr capsule Take 1 capsule (180 mg total) by mouth daily. KEEP OV. 90 capsule 0  . furosemide (LASIX) 40 MG tablet Take one-half tablet by mouth daily may, increase to one tablet by mouth daily if needed 90 tablet 3  . hydrocortisone (ANUSOL-HC) 25 MG suppository Place 1 suppository (25 mg total) rectally 2 (two) times daily. 12 suppository 0  . levothyroxine (SYNTHROID, LEVOTHROID) 25 MCG tablet TAKE 1 TABLET EVERY DAY BEFORE BREAKFAST 90 tablet 0  . loratadine (CLARITIN) 10 MG tablet Take 10 mg by mouth daily as needed for rhinitis.     [name] warfarin (COUMADIN) 5 MG tablet TAKE AS DIRECTED BY ANTI-COAGULATION CLINIC 90 tablet 0   No current facility-administered medications on file prior to visit.    Review of Systems Review of Systems  Constitutional: Negative for fever, appetite change, fatigue and unexpected weight change.  Eyes: Negative for pain and visual disturbance.  Respiratory: Negative for cough and shortness of breath.   Cardiovascular: Negative for cp or palpitations    Gastrointestinal: Negative for nausea, diarrhea and constipation.  Genitourinary: Negative for urgency and frequency.  Skin: Negative for pallor or rash  pos for itching in area of prior tick bite   Neurological: Negative for weakness, light-headedness, numbness and headaches.  Hematological: Negative for adenopathy. Does not bruise/bleed easily.  Psychiatric/Behavioral: Negative for dysphoric mood. The patient is not nervous/anxious.         Objective:   Physical Exam  Constitutional: She appears well-developed and well-nourished. No distress.  Well appearing , wt loss noted   Eyes: Conjunctivae and EOM are normal. Pupils are  equal, round, and reactive to light. Right eye exhibits no discharge. Left eye exhibits no discharge.  Neck: Normal range of motion. Neck supple.  Cardiovascular: Normal rate and regular rhythm.   Pulmonary/Chest: Effort normal and breath sounds normal. She has no wheezes.  Musculoskeletal: She exhibits no edema or tenderness.  No joint swelling   Lymphadenopathy:    She has no cervical adenopathy.  Neurological: She is alert. She has normal reflexes. No cranial nerve deficit. She exhibits normal muscle tone. Coordination normal.  Skin: Skin is warm and dry. No rash noted. No pallor.  Small skin tags noted below eyes   2-3 mm area of prior tick bite on R groin area - scant erythema and few excoriations  No open areas or drainage No tick parts seen  Psychiatric: She has a normal mood and affect.          Assessment & Plan:   Problem List Items Addressed This Visit      Musculoskeletal and Integument   Tick bite - Primary    Tick bite from 18 months ago with local symptoms (no tick fever and rev labs neg for rmsf and lyme) Suspect scar tissue , no tick parts remain  tx with cryotx -liquid nitrogen freeze and thaw -in hopes of area scabbing and healing for relief Disc care of area-keep clean and dry  Update if no imp of s/s of infection         Other   Itching    In area of old tick bite  tx with cryotherapy and wound care disc Update if no imp  Recommend claritin 10 mg daily for itch prn and keep cool

## 2015-10-06 NOTE — Assessment & Plan Note (Signed)
In area of old tick bite  tx with cryotherapy and wound care disc Update if no imp  Recommend claritin 10 mg daily for itch prn and keep cool

## 2015-10-06 NOTE — Assessment & Plan Note (Signed)
Tick bite from 18 months ago with local symptoms (no tick fever and rev labs neg for rmsf and lyme) Suspect scar tissue , no tick parts remain  tx with cryotx -liquid nitrogen freeze and thaw -in hopes of area scabbing and healing for relief Disc care of area-keep clean and dry  Update if no imp of s/s of infection

## 2015-10-10 ENCOUNTER — Other Ambulatory Visit: Payer: Self-pay | Admitting: Internal Medicine

## 2015-10-21 ENCOUNTER — Ambulatory Visit (INDEPENDENT_AMBULATORY_CARE_PROVIDER_SITE_OTHER): Payer: Medicare Other | Admitting: Internal Medicine

## 2015-10-21 ENCOUNTER — Encounter: Payer: Self-pay | Admitting: Internal Medicine

## 2015-10-21 VITALS — BP 106/62 | HR 81 | Ht 65.5 in | Wt 161.4 lb

## 2015-10-21 DIAGNOSIS — I481 Persistent atrial fibrillation: Secondary | ICD-10-CM | POA: Diagnosis not present

## 2015-10-21 DIAGNOSIS — I4819 Other persistent atrial fibrillation: Secondary | ICD-10-CM

## 2015-10-21 NOTE — Patient Instructions (Signed)
Medication Instructions: - Your physician has recommended you make the following change in your medication:  1) Stop digoxin  Labwork: - none  Procedures/Testing: - none  Follow-Up: - Your physician wants you to follow-up in: 6 months with Tommye Standard, PA. You will receive a reminder letter in the mail two months in advance. If you don't receive a letter, please call our office to schedule the follow-up appointment.  Any Additional Special Instructions Will Be Listed Below (If Applicable). - Last tetanus shot that we see on file for you was 02/03/05    If you need a refill on your cardiac medications before your next appointment, please call your pharmacy.

## 2015-10-21 NOTE — Progress Notes (Signed)
Electrophysiology Office Note   Date:  10/21/2015   ID:  [name], DOB Nov 02, 1932, MRN [id number]  PCP:  [name], MD  Cardiologist:   Primary Electrophysiologist:  Virl Axe, MD    No chief complaint on file.    History of Present Illness: [name] is a 82 y.o. female seen in followup electrophysiology evaluation for atrial fibrillation.    She has hx of  atrial flutter ablation related to prior atriotomy undertaken at [facility] in March [id number] complicated by recurrent atrial arrhythmias prompting initiation of Tikosyn  This was stopped fall 2012 Recurrent symptoms prompted [name] to reinitiate Tikosyn.  Shes had recurrent episodes of atrial fibrillation associated with a rapid ventricular response.   She underwent cardioversion and reverted again to atrial fibrillation; it is now permanent She has decided to remain on warfarin  Her ejection fraction initially was about 35-40% and repeat assessment 10/14  demonstrated 55-60% with improved rate control following the initiation of digoxin.   Repeat he has assessment to/17 EF 50-55% with biatrial enlargement.   She has lost 15 pounds on a low-carb diet. She feels great. There is less shortness of breath      She is taking care of her 32nd grandchild    Past Medical History:  Diagnosis Date  . Allergic rhinitis   . 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 Asthma    "brought out by smoke, air quality"  . Atrial fibrillation -persistent    a. s/p PVI [name] 2010;  b. on tikosyn/coumadin;  c. 05/2009 Echo: EF 60-65%, Gr 2 DD.  . Cardiomyopathy    rate-related-resolved  . Carotid stenosis    mild (hosp 3/11)- consult by vasc/ Dr Donnetta Hutching  . Complication of anesthesia    "I have trouble waking up; I'm very sensitive to anesthesia"  . Depression   . Diverticulosis   . Fatty liver   . FH: colonic polyps   . Heart murmur    "prior to valve repair"  . Hyperlipidemia   . Pneumonia 1951   "double"  . Pulmonary nodule   . Syncope and  collapse   . TIA (transient ischemic attack) 2010   "left facial numbness after I drank a thermos of iced coffee; not sure it was a TIA"  . Tubular adenoma    Past Surgical History:  Procedure Laterality Date  . admit- syncope, bradycardia     cardiac and neuro work up  . APPENDECTOMY  1978  . AV NODE ABLATION     "2 @ [name]; 1 @ [place]"  . carotid doppler  10/07   no stenosis  . CT of abd and pelvis  10/07   negative  . DILATION AND CURETTAGE OF UTERUS    . EXCISIONAL HEMORRHOIDECTOMY  1990's  . exercise stress test  2/07  . gross hematuria on anticoagulants  10/07   neg urol work up   . MITRAL VALVE ANNULOPLASTY  10/23/1998   "Model 4625; Seriel [id number]"; size 75mm; [facility]; Dr. Boyce Medici  . PILONIDAL CYST EXCISION  1954  . TUBAL LIGATION  1978     Current Outpatient Prescriptions  Medication Sig Dispense Refill  . albuterol (PROVENTIL HFA;VENTOLIN HFA) 108 (90 BASE) MCG/ACT inhaler Inhale 2 puffs into the lungs every 4 (four) hours as needed for wheezing or shortness of breath. 1 Inhaler 0  . digoxin (LANOXIN) 0.125 MG tablet TAKE 1 TABLET ONE TIME DAILY 90 tablet 0  . diltiazem (CARTIA XT) 180 MG 24  hr capsule Take 1 capsule (180 mg total) by mouth daily. 90 capsule 1  . fluorouracil (EFUDEX) 5 % cream Apply 1 application topically 2 (two) times daily.    . furosemide (LASIX) 40 MG tablet Take one-half tablet by mouth daily may, increase to one tablet by mouth daily if needed 90 tablet 3  . levothyroxine (SYNTHROID, LEVOTHROID) 25 MCG tablet TAKE 1 TABLET EVERY DAY BEFORE BREAKFAST 90 tablet 0  . loratadine (CLARITIN) 10 MG tablet Take 10 mg by mouth daily as needed for rhinitis.     [name] warfarin (COUMADIN) 5 MG tablet TAKE AS DIRECTED BY ANTI-COAGULATION CLINIC 90 tablet 0   No current facility-administered medications for this visit.     Allergies:   Amiodarone hcl; Statins; and Penicillins   Social History:  The patient  reports that she has never smoked. She  has never used smokeless tobacco. She reports that she drinks alcohol. She reports that she does not use drugs.   Family History:  The patient's family history includes Alcohol abuse in her father; Cancer in her father; Hypertension in her mother; Lung cancer in her father.    ROS:  Please see the history of present illness.  .   All other systems are reviewed and negative.    PHYSICAL EXAM: VS:  BP 106/62   Pulse 81   Ht 5' 5.5" (1.664 m)   Wt 161 lb 6.4 oz (73.2 kg)   SpO2 95%   BMI 26.45 kg/m  , BMI Body mass index is 26.45 kg/m. GEN: Well nourished, well developed, in no acute distress HEENT: normal Neck: JVD flat, carotid bruits, or masses Cardiac: IRR RR;  no murmur  rubs,  noS4  Respiratory:  clear to auscultation bilaterally, normal work of breathing Back without kyphosis or CVAT GI: soft, nontender, nondistended, + BS MS: no deformity or atrophy Skin: warm and dry,   Extremities No Clubbing cyanosis  Edema Neuro:  Strength and sensation are intact Psych: euthymic mood, full affect  EKG:  EKG is  ordered today. The ekg ordered today a atrial fibrillation at  80s /-10/36     Recent Labs: 04/19/2015: TSH 2.973 05/15/2015: ALT 11; BUN 16; Creat 0.80; Hemoglobin 13.3; Platelets 165; Potassium 4.5; Sodium 139    Lipid Panel     Component Value Date/Time   CHOL (H) 06/13/2009 0430    268        ATP III CLASSIFICATION:  <200     mg/dL   Desirable  200-239  mg/dL   Borderline High  >=240    mg/dL   High          TRIG 113 06/13/2009 0430   HDL 41 06/13/2009 0430   CHOLHDL 6.5 06/13/2009 0430   VLDL 23 06/13/2009 0430   LDLCALC (H) 06/13/2009 0430    204        Total Cholesterol/HDL:CHD Risk Coronary Heart Disease Risk Table                     Men   Women  1/2 Average Risk   3.4   3.3  Average Risk       5.0   4.4  2 X Average Risk   9.6   7.1  3 X Average Risk  23.4   11.0        Use the calculated Patient Ratio above and the CHD Risk Table to  determine the patient's CHD Risk.  ATP III CLASSIFICATION (LDL):  <100     mg/dL   Optimal  100-129  mg/dL   Near or Above                    Optimal  130-159  mg/dL   Borderline  160-189  mg/dL   High  >190     mg/dL   Very High   LDLDIRECT 203.8 01/09/2008 0934     Wt Readings from Last 3 Encounters:  10/21/15 161 lb 6.4 oz (73.2 kg)  10/04/15 159 lb 8 oz (72.3 kg)  05/15/15 175 lb (79.4 kg)      Other studies Reviewed: Additional studies/ records that were reviewed today include: labs as noted  Review of the above records today demonstrates:    ASSESSMENT AND PLAN: Atrial fibrillation-permanent  Bronchitis  Hypothyroidism-treated  High risk medications  Dypsnea on exertion --improved  We will stop her digoxin  Tolerating anticoagulation    Current medicines are reviewed at length with the patient today.   The patient does not have concerns regarding her medicines.  The following changes were made today:  none  Labs/ tests ordered today include:    No orders of the defined types were placed in this encounter.    Disposition:   *6   Signed, Virl Axe, MD  10/21/2015 4:13 PM     [place] [address] [phone] (office) [phone] (fax)

## 2015-10-23 DIAGNOSIS — T887XXA Unspecified adverse effect of drug or medicament, initial encounter: Secondary | ICD-10-CM | POA: Diagnosis not present

## 2015-10-23 DIAGNOSIS — L821 Other seborrheic keratosis: Secondary | ICD-10-CM | POA: Diagnosis not present

## 2015-10-23 DIAGNOSIS — L570 Actinic keratosis: Secondary | ICD-10-CM | POA: Diagnosis not present

## 2015-10-25 DIAGNOSIS — M9903 Segmental and somatic dysfunction of lumbar region: Secondary | ICD-10-CM | POA: Diagnosis not present

## 2015-10-25 DIAGNOSIS — M5416 Radiculopathy, lumbar region: Secondary | ICD-10-CM | POA: Diagnosis not present

## 2015-10-25 DIAGNOSIS — M9905 Segmental and somatic dysfunction of pelvic region: Secondary | ICD-10-CM | POA: Diagnosis not present

## 2015-10-25 DIAGNOSIS — M955 Acquired deformity of pelvis: Secondary | ICD-10-CM | POA: Diagnosis not present

## 2015-10-31 ENCOUNTER — Telehealth: Payer: Self-pay | Admitting: Internal Medicine

## 2015-10-31 NOTE — Telephone Encounter (Signed)
The patient is aware this is ok to take.

## 2015-10-31 NOTE — Telephone Encounter (Signed)
Will forward to pharmacy to review and advise me.

## 2015-10-31 NOTE — Telephone Encounter (Signed)
Yes this is just fine for pt to take.

## 2015-10-31 NOTE — Telephone Encounter (Signed)
Want to know if I can take Vistafusion B-12 Vit.

## 2015-11-04 ENCOUNTER — Telehealth: Payer: Self-pay

## 2015-11-04 NOTE — Telephone Encounter (Signed)
Returned pt phone call in regards to starting a NOAC other then Coumadin Pt state she has concerns about taking Coumadin due to stroke risks.  Pt states she has done research and learned there are better medications for [facility] to take besides warfarin that would reduce her risk for stroke much better and was wanting Dr. Caryl Comes to prescribe one. Pt states she would also prefer and AC that doesn't make her bruise as easily as she is bruising now.  I instructed the patient that per Dr. Caryl Comes he has no opposition to changing to a NOAC for her but did not mention a specific medication. Pt states she would like to know the most cost effective medication as she is on a fixed income and cannot afford an expensive medication. Pt states she has had no negative symptoms on warfarin and has no problems staying on warfarin if Dr. Caryl Comes sees fit. I told pt I would forward all her questions and concerns to our pharmacists Gay Filler or Jinny Blossom and once all agreed I would call her back with the therapy change if one is ordered.  Pt is stated she understood and is agreeable to current plan for therapy

## 2015-11-07 ENCOUNTER — Other Ambulatory Visit (INDEPENDENT_AMBULATORY_CARE_PROVIDER_SITE_OTHER): Payer: Medicare Other

## 2015-11-07 DIAGNOSIS — I4819 Other persistent atrial fibrillation: Secondary | ICD-10-CM

## 2015-11-07 DIAGNOSIS — I481 Persistent atrial fibrillation: Secondary | ICD-10-CM

## 2015-11-07 DIAGNOSIS — Z5181 Encounter for therapeutic drug level monitoring: Secondary | ICD-10-CM | POA: Diagnosis not present

## 2015-11-07 LAB — POCT INR: INR: 2.8

## 2015-11-08 ENCOUNTER — Other Ambulatory Visit: Payer: Self-pay | Admitting: *Deleted

## 2015-11-08 MED ORDER — LEVOTHYROXINE SODIUM 25 MCG PO TABS: ORAL_TABLET | ORAL | 1 refills | Status: DC

## 2015-11-10 ENCOUNTER — Telehealth: Payer: Self-pay | Admitting: Family Medicine

## 2015-11-10 DIAGNOSIS — K921 Melena: Secondary | ICD-10-CM

## 2015-11-10 NOTE — Telephone Encounter (Signed)
Will send ref to [facility]

## 2015-11-10 NOTE — Telephone Encounter (Signed)
-----   Message from [place], [place] sent at 11/08/2015  4:29 PM EDT ----- Pt notified of INR results and Dr. Marliss Coots comments, lab appt scheduled and pt said she has went to [facility] in the past and she would like a referral to go back to them, pt said she doesn't have any other sxs now but she just has on and off rectal bleeding, please put referral in and I advise pt our [facility] will call and schedule appt

## 2015-11-11 NOTE — Telephone Encounter (Signed)
[facility] Appt made with Dr Wilfrid Lund for 12/18/15. Patient was told to call Rosaria Ferries if her symptoms worsen and we may be able to get her seen by their N.P.

## 2015-11-13 ENCOUNTER — Encounter: Payer: Self-pay | Admitting: Family Medicine

## 2015-11-13 ENCOUNTER — Ambulatory Visit (INDEPENDENT_AMBULATORY_CARE_PROVIDER_SITE_OTHER): Payer: Medicare Other | Admitting: Family Medicine

## 2015-11-13 VITALS — BP 110/70 | HR 86 | Temp 98.0°F | Wt 160.8 lb

## 2015-11-13 DIAGNOSIS — J069 Acute upper respiratory infection, unspecified: Secondary | ICD-10-CM

## 2015-11-13 DIAGNOSIS — R059 Cough, unspecified: Secondary | ICD-10-CM

## 2015-11-13 DIAGNOSIS — R05 Cough: Secondary | ICD-10-CM | POA: Diagnosis not present

## 2015-11-13 MED ORDER — BENZONATATE 100 MG PO CAPS: 100.0000 mg | ORAL_CAPSULE | Freq: Two times a day (BID) | ORAL | 0 refills | Status: DC | PRN

## 2015-11-13 MED ORDER — AZITHROMYCIN 250 MG PO TABS: ORAL_TABLET | ORAL | 0 refills | Status: DC

## 2015-11-13 NOTE — Progress Notes (Signed)
Subjective:    Patient ID: [name], female    DOB: [birth year], 82 y.o.   MRN: [id number]  HPI This is a 82 yo female who presents today with 2 days of cough. Thought related to allergies and took some allergy medication without relief. Feel like she couldn't clear her throat and voice scratchy. Felt uncomfortable lying down and used her inhaler with some improvement. Mucus feels like it is coming from deep in her chest, thick, yellow. Feels wheezing. No known sick contacts. No known fever. Some ear itching, some post nasal drainage. Has history of asthma. She comes in today to get treated to prevent worsening.   She has an area of inflammation related to a tick bite 18 months ago. She reports that the area continues to be itchy. Area has been frozen twice. Irritated by clothing. Has also been seen by dermatology without improvement.    Past Medical History:  Diagnosis Date  . Allergic rhinitis   . 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 Asthma    "brought out by smoke, air quality"  . Atrial fibrillation -persistent    a. s/p PVI [name] 2010;  b. on tikosyn/coumadin;  c. 05/2009 Echo: EF 60-65%, Gr 2 DD.  . Cardiomyopathy    rate-related-resolved  . Carotid stenosis    mild (hosp 3/11)- consult by vasc/ Dr Donnetta Hutching  . Complication of anesthesia    "I have trouble waking up; I'm very sensitive to anesthesia"  . Depression   . Diverticulosis   . Fatty liver   . FH: colonic polyps   . Heart murmur    "prior to valve repair"  . Hyperlipidemia   . Pneumonia 1951   "double"  . Pulmonary nodule   . Syncope and collapse   . TIA (transient ischemic attack) 2010   "left facial numbness after I drank a thermos of iced coffee; not sure it was a TIA"  . Tubular adenoma    Past Surgical History:  Procedure Laterality Date  . admit- syncope, bradycardia     cardiac and neuro work up  . APPENDECTOMY  1978  . AV NODE ABLATION     "2 @ [name]; 1 @ [place]"  . carotid doppler  10/07   no stenosis   . CT of abd and pelvis  10/07   negative  . DILATION AND CURETTAGE OF UTERUS    . EXCISIONAL HEMORRHOIDECTOMY  1990's  . exercise stress test  2/07  . gross hematuria on anticoagulants  10/07   neg urol work up   . MITRAL VALVE ANNULOPLASTY  10/23/1998   "Model 4625; Seriel [id number]"; size 29mm; [facility]; Dr. Boyce Medici  . PILONIDAL CYST EXCISION  1954  . TUBAL LIGATION  1978   Family History  Problem Relation Age of Onset  . Hypertension Mother   . Lung cancer Father     smoker  . Alcohol abuse Father   . Cancer Father     bladder and lung CA smoker  . Breast cancer Neg Hx    Social History  Substance Use Topics  . Smoking status: Never Smoker  . Smokeless tobacco: Never Used  . Alcohol use 0.0 oz/week     Comment: 04/26/2014 "occasional glass of wine; a few times/yr"      Review of Systems Per HPI    Objective:   Physical Exam  Constitutional: She is oriented to person, place, and time. She appears well-developed and well-nourished.  HENT:  Head: Normocephalic and  atraumatic.  Right Ear: Tympanic membrane, external ear and ear canal normal.  Left Ear: Tympanic membrane, external ear and ear canal normal.  Nose: Mucosal edema and rhinorrhea present. Right sinus exhibits no maxillary sinus tenderness and no frontal sinus tenderness. Left sinus exhibits no maxillary sinus tenderness and no frontal sinus tenderness.  Mouth/Throat: Uvula is midline, oropharynx is clear and moist and mucous membranes are normal.  Cardiovascular: Normal rate and normal heart sounds.  An irregular rhythm present.  Pulmonary/Chest: Effort normal and breath sounds normal.  Intermittent, deep cough.    Musculoskeletal: Normal range of motion.  Neurological: She is alert and oriented to person, place, and time.  Skin: Skin is warm and dry.  Psychiatric: She has a normal mood and affect. Her behavior is normal. Judgment and thought content normal.  Vitals reviewed.     BP 110/70    Pulse 86   Temp 98 F (36.7 C)   Wt 160 lb 12.8 oz (72.9 kg)   SpO2 98%   BMI 26.35 kg/m      Assessment & Plan:  1. Acute upper respiratory infection - Provided written and verbal information regarding diagnosis and treatment. - warned her to be aware of potential increased risk of bleeding on antibiotic and to monitor for any bleeding - azithromycin (ZITHROMAX) 250 MG tablet; Take two tablets today then one tablet daily until finished  Dispense: 6 tablet; Refill: 0 - RTC precautions reviewed - encouraged her to use albuterol inhaler q 2-4 hours prn cough/tighness  2. Cough - benzonatate (TESSALON) 100 MG capsule; Take 1 capsule (100 mg total) by mouth 2 (two) times daily as needed for cough.  Dispense: 20 capsule; Refill: 0   Clarene Reamer, FNP-BC  [facility]  11/13/2015 12:55 PM

## 2015-11-13 NOTE — Progress Notes (Signed)
Pre visit review using our clinic review tool, if applicable. No additional management support is needed unless otherwise documented below in the visit note. 

## 2015-11-13 NOTE — Patient Instructions (Signed)
Can use your albuterol inhaler every 2-4 hours as needed for wheezing/tightness Respiratory Infection, Adult Most upper respiratory infections (URIs) are a viral infection of the air passages leading to the lungs. A URI affects the nose, throat, and upper air passages. The most common type of URI is nasopharyngitis and is typically referred to as "the common cold." URIs run their course and usually go away on their own. Most of the time, a URI does not require medical attention, but sometimes a bacterial infection in the upper airways can follow a viral infection. This is called a secondary infection. Sinus and middle ear infections are common types of secondary upper respiratory infections. Bacterial pneumonia can also complicate a URI. A URI can worsen asthma and chronic obstructive pulmonary disease (COPD). Sometimes, these complications can require emergency medical care and may be life threatening.  CAUSES Almost all URIs are caused by viruses. A virus is a type of germ and can spread from one person to another.  RISKS FACTORS You may be at risk for a URI if:   You smoke.   You have chronic heart or lung disease.  You have a weakened defense (immune) system.   You are very young or very old.   You have nasal allergies or asthma.  You work in crowded or poorly ventilated areas.  You work in health care facilities or schools. SIGNS AND SYMPTOMS  Symptoms typically develop 2-3 days after you come in contact with a cold virus. Most viral URIs last 7-10 days. However, viral URIs from the influenza virus (flu virus) can last 14-18 days and are typically more severe. Symptoms may include:   Runny or stuffy (congested) nose.   Sneezing.   Cough.   Sore throat.   Headache.   Fatigue.   Fever.   Loss of appetite.   Pain in your forehead, behind your eyes, and over your cheekbones (sinus pain).  Muscle aches.  DIAGNOSIS  Your health care provider may diagnose a URI  by:  Physical exam.  Tests to check that your symptoms are not due to another condition such as:  Strep throat.  Sinusitis.  Pneumonia.  Asthma. TREATMENT  A URI goes away on its own with time. It cannot be cured with medicines, but medicines may be prescribed or recommended to relieve symptoms. Medicines may help:  Reduce your fever.  Reduce your cough.  Relieve nasal congestion. HOME CARE INSTRUCTIONS   Take medicines only as directed by your health care provider.   Gargle warm saltwater or take cough drops to comfort your throat as directed by your health care provider.  Use a warm mist humidifier or inhale steam from a shower to increase air moisture. This may make it easier to breathe.  Drink enough fluid to keep your urine clear or pale yellow.   Eat soups and other clear broths and maintain good nutrition.   Rest as needed.   Return to work when your temperature has returned to normal or as your health care provider advises. You may need to stay home longer to avoid infecting others. You can also use a face mask and careful hand washing to prevent spread of the virus.  Increase the usage of your inhaler if you have asthma.   Do not use any tobacco products, including cigarettes, chewing tobacco, or electronic cigarettes. If you need help quitting, ask your health care provider. PREVENTION  The best way to protect yourself from getting a cold is to practice good hygiene.  Avoid oral or hand contact with people with cold symptoms.   Wash your hands often if contact occurs.  There is no clear evidence that vitamin C, vitamin E, echinacea, or exercise reduces the chance of developing a cold. However, it is always recommended to get plenty of rest, exercise, and practice good nutrition.  SEEK MEDICAL CARE IF:   You are getting worse rather than better.   Your symptoms are not controlled by medicine.   You have chills.  You have worsening shortness of  breath.  You have brown or red mucus.  You have yellow or brown nasal discharge.  You have pain in your face, especially when you bend forward.  You have a fever.  You have swollen neck glands.  You have pain while swallowing.  You have white areas in the back of your throat. SEEK IMMEDIATE MEDICAL CARE IF:   You have severe or persistent:  Headache.  Ear pain.  Sinus pain.  Chest pain.  You have chronic lung disease and any of the following:  Wheezing.  Prolonged cough.  Coughing up blood.  A change in your usual mucus.  You have a stiff neck.  You have changes in your:  Vision.  Hearing.  Thinking.  Mood. MAKE SURE YOU:   Understand these instructions.  Will watch your condition.  Will get help right away if you are not doing well or get worse.   This information is not intended to replace advice given to you by your health care provider. Make sure you discuss any questions you have with your health care provider.   Document Released: 09/02/2000 Document Revised: 07/24/2014 Document Reviewed: 06/14/2013 Elsevier Interactive Patient Education [employer].

## 2015-11-14 DIAGNOSIS — M5416 Radiculopathy, lumbar region: Secondary | ICD-10-CM | POA: Diagnosis not present

## 2015-11-14 DIAGNOSIS — M9905 Segmental and somatic dysfunction of pelvic region: Secondary | ICD-10-CM | POA: Diagnosis not present

## 2015-11-14 DIAGNOSIS — M9903 Segmental and somatic dysfunction of lumbar region: Secondary | ICD-10-CM | POA: Diagnosis not present

## 2015-11-14 DIAGNOSIS — M955 Acquired deformity of pelvis: Secondary | ICD-10-CM | POA: Diagnosis not present

## 2015-11-18 DIAGNOSIS — M955 Acquired deformity of pelvis: Secondary | ICD-10-CM | POA: Diagnosis not present

## 2015-11-18 DIAGNOSIS — M5416 Radiculopathy, lumbar region: Secondary | ICD-10-CM | POA: Diagnosis not present

## 2015-11-18 DIAGNOSIS — M9905 Segmental and somatic dysfunction of pelvic region: Secondary | ICD-10-CM | POA: Diagnosis not present

## 2015-11-18 DIAGNOSIS — M9903 Segmental and somatic dysfunction of lumbar region: Secondary | ICD-10-CM | POA: Diagnosis not present

## 2015-11-20 DIAGNOSIS — M9903 Segmental and somatic dysfunction of lumbar region: Secondary | ICD-10-CM | POA: Diagnosis not present

## 2015-11-20 DIAGNOSIS — M9905 Segmental and somatic dysfunction of pelvic region: Secondary | ICD-10-CM | POA: Diagnosis not present

## 2015-11-20 DIAGNOSIS — M5416 Radiculopathy, lumbar region: Secondary | ICD-10-CM | POA: Diagnosis not present

## 2015-11-20 DIAGNOSIS — M955 Acquired deformity of pelvis: Secondary | ICD-10-CM | POA: Diagnosis not present

## 2015-11-21 DIAGNOSIS — M9903 Segmental and somatic dysfunction of lumbar region: Secondary | ICD-10-CM | POA: Diagnosis not present

## 2015-11-21 DIAGNOSIS — M9905 Segmental and somatic dysfunction of pelvic region: Secondary | ICD-10-CM | POA: Diagnosis not present

## 2015-11-21 DIAGNOSIS — M955 Acquired deformity of pelvis: Secondary | ICD-10-CM | POA: Diagnosis not present

## 2015-11-21 DIAGNOSIS — M5416 Radiculopathy, lumbar region: Secondary | ICD-10-CM | POA: Diagnosis not present

## 2015-11-26 DIAGNOSIS — H2513 Age-related nuclear cataract, bilateral: Secondary | ICD-10-CM | POA: Diagnosis not present

## 2015-11-27 DIAGNOSIS — M9905 Segmental and somatic dysfunction of pelvic region: Secondary | ICD-10-CM | POA: Diagnosis not present

## 2015-11-27 DIAGNOSIS — M955 Acquired deformity of pelvis: Secondary | ICD-10-CM | POA: Diagnosis not present

## 2015-11-27 DIAGNOSIS — M9903 Segmental and somatic dysfunction of lumbar region: Secondary | ICD-10-CM | POA: Diagnosis not present

## 2015-11-27 DIAGNOSIS — M5416 Radiculopathy, lumbar region: Secondary | ICD-10-CM | POA: Diagnosis not present

## 2015-11-28 DIAGNOSIS — M955 Acquired deformity of pelvis: Secondary | ICD-10-CM | POA: Diagnosis not present

## 2015-11-28 DIAGNOSIS — M9903 Segmental and somatic dysfunction of lumbar region: Secondary | ICD-10-CM | POA: Diagnosis not present

## 2015-11-28 DIAGNOSIS — M5416 Radiculopathy, lumbar region: Secondary | ICD-10-CM | POA: Diagnosis not present

## 2015-11-28 DIAGNOSIS — M9905 Segmental and somatic dysfunction of pelvic region: Secondary | ICD-10-CM | POA: Diagnosis not present

## 2015-11-29 ENCOUNTER — Ambulatory Visit: Payer: Medicare Other | Admitting: Family Medicine

## 2015-11-29 ENCOUNTER — Ambulatory Visit (INDEPENDENT_AMBULATORY_CARE_PROVIDER_SITE_OTHER): Payer: Medicare Other | Admitting: Family Medicine

## 2015-11-29 ENCOUNTER — Encounter: Payer: Self-pay | Admitting: Family Medicine

## 2015-11-29 VITALS — BP 134/82 | HR 90 | Temp 98.1°F | Ht 65.0 in | Wt 159.2 lb

## 2015-11-29 DIAGNOSIS — R300 Dysuria: Secondary | ICD-10-CM

## 2015-11-29 DIAGNOSIS — N3000 Acute cystitis without hematuria: Secondary | ICD-10-CM | POA: Diagnosis not present

## 2015-11-29 DIAGNOSIS — N390 Urinary tract infection, site not specified: Secondary | ICD-10-CM | POA: Insufficient documentation

## 2015-11-29 LAB — POC URINALSYSI DIPSTICK (AUTOMATED)
Bilirubin, UA: NEGATIVE
Blood, UA: 200
Glucose, UA: NEGATIVE
KETONES UA: NEGATIVE
Nitrite, UA: POSITIVE
PH UA: 6
Spec Grav, UA: >=1.03
UROBILINOGEN UA: 0.2

## 2015-11-29 MED ORDER — SULFAMETHOXAZOLE-TRIMETHOPRIM 800-160 MG PO TABS: 1.0000 | ORAL_TABLET | Freq: Two times a day (BID) | ORAL | 0 refills | Status: DC

## 2015-11-29 NOTE — Progress Notes (Signed)
Subjective:    Patient ID: [name], female    DOB: 1932-05-31, 82 y.o.   MRN: [id number]  HPI Here for urinary symptoms  Burning to urinate  A lot of pain to urinate  Hard to tell if there is blood in urine  Frequency and urgency  Drinks a lot of water   Perhaps a little pain in bladder area  No flank pain    Has to wear a panty liner due to rectal bleeding - has appt with GI coming up   Thinks that may have caused some problems   Post ua Results for orders placed or performed in visit on 11/29/15  POCT Urinalysis Dipstick (Automated)  Result Value Ref Range   Color, UA Yellow    Clarity, UA Cloudy    Glucose, UA Negative    Bilirubin, UA Negative    Ketones, UA Negative    Spec Grav, UA >=1.030    Blood, UA 200 Ery/uL    pH, UA 6.0    Protein, UA 30 mg/dL    Urobilinogen, UA 0.2    Nitrite, UA Positive    Leukocytes, UA large (3+) (A) Negative    Takes 5 mg daily with 2.5 on Monday of warfarin  Lab Results  Component Value Date   INR 2.8 11/07/2015   INR 2.8 09/26/2015   INR 2.6 08/29/2015   Patient Active Problem List   Diagnosis Date Noted  . UTI (urinary tract infection) 11/29/2015  . Itching 10/04/2015  . Tick bite 08/22/2014  . Urticaria 08/22/2014  . Hip pain 08/02/2014  . Left knee pain 08/02/2014  . Chronic cough 05/08/2014  . Accidental overdose 04/26/2014  . Drug overdose 04/26/2014  . Abdominal muscle pain 04/16/2014  . Acute bronchitis with bronchospasm 04/09/2014  . Blood in stool 04/09/2014  . Caregiver stress 08/16/2013  . Colon cancer screening 08/16/2013  . Fatigue 08/16/2013  . Encounter for therapeutic drug monitoring 04/20/2013  . Left ovarian cyst 03/14/2013  . Chest wall contusion 03/14/2013  . (HFpEF) heart failure with preserved ejection fraction ([place]) 12/27/2012  . Cardiomyopathy, secondary --Resolved again 10/14 10/13/2010  . COLONIC POLYPS, ADENOMATOUS, HX OF 09/18/2009  . ABDOMINAL PAIN 08/27/2009  . PULMONARY  NODULE 12/20/2008  . GANGLION CYST 10/04/2007  . HYPERLIPIDEMIA 04/27/2007  . DEPRESSION 04/27/2007  . Asthma, mild intermittent 04/27/2007  . INSOMNIA 04/27/2007  . ADENOMATOUS COLONIC POLYP 11/04/2006  . Hypothyroidism 09/02/2006  . Atrial fibrillation ([place]) 08/05/2006   Past Medical History:  Diagnosis Date  . Allergic rhinitis   . 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 Asthma    "brought out by smoke, air quality"  . Atrial fibrillation -persistent    a. s/p PVI [name] 2010;  b. on tikosyn/coumadin;  c. 05/2009 Echo: EF 60-65%, Gr 2 DD.  . Cardiomyopathy    rate-related-resolved  . Carotid stenosis    mild (hosp 3/11)- consult by vasc/ Dr Donnetta Hutching  . Complication of anesthesia    "I have trouble waking up; I'm very sensitive to anesthesia"  . Depression   . Diverticulosis   . Fatty liver   . FH: colonic polyps   . Heart murmur    "prior to valve repair"  . Hyperlipidemia   . Pneumonia 1951   "double"  . Pulmonary nodule   . Syncope and collapse   . TIA (transient ischemic attack) 2010   "left facial numbness after I drank a thermos of iced coffee; not sure it was a  TIA"  . Tubular adenoma    Past Surgical History:  Procedure Laterality Date  . admit- syncope, bradycardia     cardiac and neuro work up  . APPENDECTOMY  1978  . AV NODE ABLATION     "2 @ [name]; 1 @ [place]"  . carotid doppler  10/07   no stenosis  . CT of abd and pelvis  10/07   negative  . DILATION AND CURETTAGE OF UTERUS    . EXCISIONAL HEMORRHOIDECTOMY  1990's  . exercise stress test  2/07  . gross hematuria on anticoagulants  10/07   neg urol work up   . MITRAL VALVE ANNULOPLASTY  10/23/1998   "Model 4625; Seriel [id number]"; size 65mm; [facility]; Dr. Boyce Medici  . PILONIDAL CYST EXCISION  1954  . TUBAL LIGATION  1978   Social History  Substance Use Topics  . Smoking status: Never Smoker  . Smokeless tobacco: Never Used  . Alcohol use 0.0 oz/week     Comment: 04/26/2014 "occasional glass of wine;  a few times/yr"   Family History  Problem Relation Age of Onset  . Hypertension Mother   . Lung cancer Father     smoker  . Alcohol abuse Father   . Cancer Father     bladder and lung CA smoker  . Breast cancer Neg Hx    Allergies  Allergen Reactions  . Amiodarone Hcl Swelling  . Statins     REACTION: rash  . Penicillins Rash    REACTION: rash   Current Outpatient Prescriptions on File Prior to Visit  Medication Sig Dispense Refill  . albuterol (PROVENTIL HFA;VENTOLIN HFA) 108 (90 BASE) MCG/ACT inhaler Inhale 2 puffs into the lungs every 4 (four) hours as needed for wheezing or shortness of breath. 1 Inhaler 0  . benzonatate (TESSALON) 100 MG capsule Take 1 capsule (100 mg total) by mouth 2 (two) times daily as needed for cough. 20 capsule 0  . diltiazem (CARTIA XT) 180 MG 24 hr capsule Take 1 capsule (180 mg total) by mouth daily. 90 capsule 1  . fluorouracil (EFUDEX) 5 % cream Apply 1 application topically 2 (two) times daily.    . furosemide (LASIX) 40 MG tablet Take one-half tablet by mouth daily may, increase to one tablet by mouth daily if needed 90 tablet 3  . levothyroxine (SYNTHROID, LEVOTHROID) 25 MCG tablet TAKE 1 TABLET EVERY DAY BEFORE BREAKFAST 90 tablet 1  . loratadine (CLARITIN) 10 MG tablet Take 10 mg by mouth daily as needed for rhinitis.     [name] warfarin (COUMADIN) 5 MG tablet TAKE AS DIRECTED BY ANTI-COAGULATION CLINIC 90 tablet 0   No current facility-administered medications on file prior to visit.     Review of Systems Review of Systems  Constitutional: Negative for fever, appetite change, fatigue and unexpected weight change.  Eyes: Negative for pain and visual disturbance.  Respiratory: Negative for cough and shortness of breath.   Cardiovascular: Negative for cp or palpitations    Gastrointestinal: Negative for nausea, diarrhea and constipation. pos for blood in stool Genitourinary: pos for urgency and frequency. pos for dysuria /neg for flank pain    Skin: Negative for pallor or rash   Neurological: Negative for weakness, light-headedness, numbness and headaches.  Hematological: Negative for adenopathy. Does not bruise/bleed easily.  Psychiatric/Behavioral: Negative for dysphoric mood. The patient is not nervous/anxious.         Objective:   Physical Exam  Constitutional: She appears well-developed and well-nourished. No distress.  Well appearing   HENT:  Head: Normocephalic and atraumatic.  Eyes: Conjunctivae and EOM are normal. Pupils are equal, round, and reactive to light.  Neck: Normal range of motion. Neck supple.  Cardiovascular: Normal rate, regular rhythm and normal heart sounds.   Pulmonary/Chest: Effort normal and breath sounds normal.  Abdominal: Soft. Bowel sounds are normal. She exhibits no distension. There is tenderness. There is no rebound.  No cva tenderness  Mild suprapubic tenderness  Musculoskeletal: She exhibits no edema.  Lymphadenopathy:    She has no cervical adenopathy.  Neurological: She is alert.  Skin: No rash noted.  Psychiatric: She has a normal mood and affect.          Assessment & Plan:   Problem List Items Addressed This Visit      Genitourinary   UTI (urinary tract infection) - Primary    Pos ua  cx pending  Symptomatic  tx with bactrim DS Enc fluids  Will cut dose of warfarin today in light of abx  Continue to watch INR Update if not starting to improve in several days or if worsening        Relevant Medications   sulfamethoxazole-trimethoprim (BACTRIM DS,SEPTRA DS) 800-160 MG tablet   Other Relevant Orders   Urine culture (Completed)    Other Visit Diagnoses    Dysuria       Relevant Orders   POCT Urinalysis Dipstick (Automated) (Completed)

## 2015-11-29 NOTE — Patient Instructions (Signed)
We will need to watch your INR on the antibiotic  Take bactrim DS as directed for uti  Today-instead of 5 mg - take 2.5 mg of warfarin  Then get back on regular schedule  Drink lots of water  If symptoms worsen let us know  We will update you with a urine culture report when it returns

## 2015-11-29 NOTE — Progress Notes (Signed)
Pre visit review using our clinic review tool, if applicable. No additional management support is needed unless otherwise documented below in the visit note. 

## 2015-12-01 NOTE — Assessment & Plan Note (Addendum)
Pos ua  cx pending  Symptomatic  tx with bactrim DS Enc fluids  Will cut dose of warfarin today in light of abx  Continue to watch INR Update if not starting to improve in several days or if worsening

## 2015-12-02 LAB — URINE CULTURE

## 2015-12-03 DIAGNOSIS — M9905 Segmental and somatic dysfunction of pelvic region: Secondary | ICD-10-CM | POA: Diagnosis not present

## 2015-12-03 DIAGNOSIS — M9903 Segmental and somatic dysfunction of lumbar region: Secondary | ICD-10-CM | POA: Diagnosis not present

## 2015-12-03 DIAGNOSIS — M5416 Radiculopathy, lumbar region: Secondary | ICD-10-CM | POA: Diagnosis not present

## 2015-12-03 DIAGNOSIS — M955 Acquired deformity of pelvis: Secondary | ICD-10-CM | POA: Diagnosis not present

## 2015-12-04 ENCOUNTER — Other Ambulatory Visit: Payer: Self-pay | Admitting: Family Medicine

## 2015-12-05 ENCOUNTER — Other Ambulatory Visit: Payer: Self-pay

## 2015-12-05 DIAGNOSIS — R05 Cough: Secondary | ICD-10-CM

## 2015-12-05 DIAGNOSIS — R059 Cough, unspecified: Secondary | ICD-10-CM

## 2015-12-05 MED ORDER — BENZONATATE 100 MG PO CAPS: 100.0000 mg | ORAL_CAPSULE | Freq: Two times a day (BID) | ORAL | 0 refills | Status: DC | PRN

## 2015-12-05 NOTE — Telephone Encounter (Signed)
Pt left v/m; pt was seen 11/13/15. Pt request refill benzonatate to [facility]; has been helpful keeping congestion and fluids out of lungs. Due to allergies.Pt request cb when done.

## 2015-12-05 NOTE — Telephone Encounter (Signed)
Per DPR left detailed v/m on home phone that benzonatate was refilled to [facility] as requested.

## 2015-12-05 NOTE — Telephone Encounter (Signed)
Pt has INR appt on 12/19/15, last filled on 03/05/15 #90 tabs with 0 refills, please advise

## 2015-12-05 NOTE — Telephone Encounter (Signed)
Please refill times 3 

## 2015-12-06 NOTE — Telephone Encounter (Signed)
done

## 2015-12-18 ENCOUNTER — Ambulatory Visit (INDEPENDENT_AMBULATORY_CARE_PROVIDER_SITE_OTHER): Payer: Medicare Other | Admitting: Gastroenterology

## 2015-12-18 ENCOUNTER — Encounter: Payer: Self-pay | Admitting: Gastroenterology

## 2015-12-18 VITALS — BP 112/70 | HR 55 | Ht 65.5 in | Wt 160.0 lb

## 2015-12-18 DIAGNOSIS — R159 Full incontinence of feces: Secondary | ICD-10-CM

## 2015-12-18 DIAGNOSIS — K625 Hemorrhage of anus and rectum: Secondary | ICD-10-CM | POA: Diagnosis not present

## 2015-12-18 DIAGNOSIS — K648 Other hemorrhoids: Secondary | ICD-10-CM | POA: Diagnosis not present

## 2015-12-18 NOTE — Progress Notes (Signed)
[name]  Chief Complaint: Rectal bleeding  Subjective  History:  This is an 82 year old woman seeing me for the first time. She saw one of our PAs in February 2016 for hemorrhoidal bleeding. She then had a colonoscopy with Dr. Deatra Key showing 2 diminutive tubular adenomas. Incidentally, she had had adenomatous polyps in 2011 and also 2008. She is bothered by frequently prolapsing and bleeding internal hemorrhoids, exacerbated by her Coumadin for A. fib. She previously had some constipation that as long since resolved. Since taking increased fiber and water, she has about 4 BMs per day. There is blood both on the paper but also frequently in the Top[place], she feels something protruding with a bowel movement. There was no improvement on  "very expensive" hemorrhoid suppositories.  ROS: Cardiovascular:  no chest pain Respiratory: no dyspnea  The patient's Past Medical, Family and Social History were reviewed and are on file in the EMR. Permanent A fib - reviewed [name] Objective:  Med list reviewed  Vital signs in last 24 hrs: Vitals:   12/18/15 0940  BP: 112/70  Pulse: (!) 55    Physical Exam    HEENT: sclera anicteric, oral mucosa moist without lesions  Neck: supple, no thyromegaly, JVD or lymphadenopathy  Cardiac: RRR without murmurs, S1S2 heard, no peripheral edema  Pulm: clear to auscultation bilaterally, normal RR and effort noted  Abdomen: soft, No tenderness, with active bowel sounds. No guarding or palpable hepatosplenomegaly.  Skin; warm and dry, no jaundice or rash Rectal: A grade 3 inflamed hemorrhoid is seen on anorectal exam. It is easily reduced, the remainder of the digital rectal exam is normal.  Anoscopy reveals 3 sets of internal hemorrhoids, and the left lateral appears to be the inflamed and prolapsing hemorrhoid.  Data Her last CBC was normal in February 2017.  @ASSESSMENTPLANBEGIN @ Assessment: Encounter Diagnoses  Name  Primary?  . Internal bleeding hemorrhoids Yes  . Rectal bleeding   . Fecal incontinence    This appears to be symptomatic internal hemorrhoids causing both bleeding and prolapse with fecal incontinence. She is a good candidate for hemorrhoidal banding, but of course there is the increased risk of the oral anticoagulant. She will most likely be okay to stop it prior to and for a while after the banding, but we will discuss this with her cardiologist. She would need to be off it for 3 days prior and 5-7 days afterwards. The bleeding risk is more so 5-7 days after the procedure and the band falls off and the expected ulcer is present. She also understands that there is a risk of stroke during the time she is not anticoagulated. She understands and wishes to procedure and she is very bothered by these hemorrhoids.   Plan: We have set up an update in a few weeks for hemorrhoidal banding and will give her appropriate instructions. Sure was given as well.   Total time 30 minutes, over half spent reviewing records, counseling and coordination of care.   [name]

## 2015-12-18 NOTE — Patient Instructions (Signed)
We will talk to your cardiologist about your blood thinner and having a hemorrhoid banding.  If you are age 80 or older, your body mass index should be between 23-30. Your Body mass index is 26.22 kg/m. If this is out of the aforementioned range listed, please consider follow up with your Primary Care Provider.  If you are age 108 or younger, your body mass index should be between 19-25. Your Body mass index is 26.22 kg/m. If this is out of the aformentioned range listed, please consider follow up with your Primary Care Provider.   Thank you for choosing [facility]  Dr Wilfrid Lund III

## 2015-12-19 ENCOUNTER — Other Ambulatory Visit (INDEPENDENT_AMBULATORY_CARE_PROVIDER_SITE_OTHER): Payer: Medicare Other

## 2015-12-19 DIAGNOSIS — I4819 Other persistent atrial fibrillation: Secondary | ICD-10-CM

## 2015-12-19 DIAGNOSIS — Z5181 Encounter for therapeutic drug level monitoring: Secondary | ICD-10-CM

## 2015-12-19 DIAGNOSIS — I481 Persistent atrial fibrillation: Secondary | ICD-10-CM

## 2015-12-19 LAB — POCT INR: INR: 2.3

## 2015-12-20 ENCOUNTER — Telehealth: Payer: Self-pay

## 2015-12-20 ENCOUNTER — Other Ambulatory Visit: Payer: Self-pay

## 2015-12-20 DIAGNOSIS — M9905 Segmental and somatic dysfunction of pelvic region: Secondary | ICD-10-CM | POA: Diagnosis not present

## 2015-12-20 DIAGNOSIS — M9903 Segmental and somatic dysfunction of lumbar region: Secondary | ICD-10-CM | POA: Diagnosis not present

## 2015-12-20 DIAGNOSIS — M955 Acquired deformity of pelvis: Secondary | ICD-10-CM | POA: Diagnosis not present

## 2015-12-20 DIAGNOSIS — M5416 Radiculopathy, lumbar region: Secondary | ICD-10-CM | POA: Diagnosis not present

## 2015-12-20 NOTE — Telephone Encounter (Signed)
  12/20/2015   RE: [name] DOB: 13-Sep-1932 MRN: [id number]   Dear Caryl Comes,    We have scheduled the above patient for an internal hemorrhoid procedure on 01-02-2016. Our records show that she is on anticoagulation therapy.   Dr Loletha Carrow is requesting to hold the patients coumadin 3 days prior to the procedure and 5 days post procedure. Please fax back/ or route the completed form to Magdalene River, [place] at [phone].   Sincerely,    T.Raheim Beutler,CMA

## 2015-12-23 NOTE — Telephone Encounter (Signed)
Pt takes Coumadin for afib with CHADS2 score of 4 (age, CHF, and ?TIA). Spoke with pt and she reports that she was told she may have had a TIA in 2006. She was not on anticoagulation at the time. States she has previously held Coumadin for 3 days prior to procedures with no bridging.   Would recommend that pt only hold Coumadin for a max of 3 days and resume ASAP after procedure. Will route to Dr. Caryl Comes for final recommendation regarding need for bridging given questionable history of TIA.

## 2015-12-24 NOTE — Telephone Encounter (Signed)
Dr Loletha Carrow please advise. Pt is scheduled for hemorrhoid banding on 01-02-2016.

## 2015-12-25 NOTE — Telephone Encounter (Signed)
Megan agree with no bridging

## 2015-12-25 NOTE — Telephone Encounter (Signed)
Ok for pt to hold Coumadin x3 days prior to procedure and resume 10/12 in the evening with no Lovenox bridging. Clearance routed to Magdalene River, [place].

## 2015-12-25 NOTE — Telephone Encounter (Signed)
I am glad we communicated with cardiology, because this changes our plans.  [name] and I talked extensively in the office about the coumadin issue.  Coumadin CANNOT be resumed the day of the procedure.  It really cannot be resumed for 5 days afterwards, because that is the time for greatest chance of bleeding (when the band sloughs off and leaves an ulcer in the rectum).  Since she can only be off it a max of three days, we will not be able to perform a banding.  She will be disappointed, but this is exactly why we communicated with cardiology about this - in order to understand the risks.  So it's prep H suppositories twice daily to try and control bleeding.  I suspect she would need to hold coumadin the same way even if surgeons were going to do it. I am happy to discuss it with her further on the 12th, but we cannot proceed with banding.

## 2015-12-25 NOTE — Telephone Encounter (Signed)
Left message to return call on home phone. Cell phone number answered and hung up. Will await return call.

## 2015-12-30 ENCOUNTER — Telehealth: Payer: Self-pay | Admitting: Gastroenterology

## 2015-12-30 NOTE — Telephone Encounter (Signed)
Pt returned call. Informed her of Dr Caryl Comes and Dr Loletha Carrow' decision about banding. Pt declined office visit follow up. Gave her the recommendation for the preporation H.

## 2015-12-30 NOTE — Telephone Encounter (Signed)
Please see other phone not for details

## 2015-12-31 ENCOUNTER — Telehealth: Payer: Self-pay

## 2015-12-31 NOTE — Telephone Encounter (Signed)
Thanks so much for doing that-agree with adv to call 911 and I will watch for ED notes

## 2015-12-31 NOTE — Telephone Encounter (Signed)
Pt said in the past week has had 2 nose bleeds; pt presently has nose bleed and has been trying for several minutes to hold nose to stop nose bleed. Pt said there is blood all over the floor and the sink is covered. Pt is at home alone. Pt has already taken coumadin today. Pt has nose clamped appropriately and nose bleed seems to be slowing but is now running down back of throat. I asked pt if she wanted me to call 911 and she said yes. I called 911 and stayed on phone until ambulance got there. Pt will call 01/01/16 with update. FYI to DR [employer].

## 2016-01-01 DIAGNOSIS — D098 Carcinoma in situ of other specified sites: Secondary | ICD-10-CM | POA: Diagnosis not present

## 2016-01-01 DIAGNOSIS — M9903 Segmental and somatic dysfunction of lumbar region: Secondary | ICD-10-CM | POA: Diagnosis not present

## 2016-01-01 DIAGNOSIS — L821 Other seborrheic keratosis: Secondary | ICD-10-CM | POA: Diagnosis not present

## 2016-01-01 DIAGNOSIS — M9905 Segmental and somatic dysfunction of pelvic region: Secondary | ICD-10-CM | POA: Diagnosis not present

## 2016-01-01 DIAGNOSIS — M955 Acquired deformity of pelvis: Secondary | ICD-10-CM | POA: Diagnosis not present

## 2016-01-01 DIAGNOSIS — Z85828 Personal history of other malignant neoplasm of skin: Secondary | ICD-10-CM | POA: Diagnosis not present

## 2016-01-01 DIAGNOSIS — M5416 Radiculopathy, lumbar region: Secondary | ICD-10-CM | POA: Diagnosis not present

## 2016-01-02 ENCOUNTER — Encounter: Payer: Medicare Other | Admitting: Gastroenterology

## 2016-01-06 ENCOUNTER — Telehealth: Payer: Self-pay | Admitting: Family Medicine

## 2016-01-06 NOTE — Telephone Encounter (Signed)
Pt has appt with Dr Glori Bickers on 01/07/16 at 11:45 with Dr Glori Bickers.

## 2016-01-06 NOTE — Telephone Encounter (Signed)
I will see her then  

## 2016-01-06 NOTE — Telephone Encounter (Signed)
[place]  Patient Name: [name]  DOB: [birth year]    Initial Comment Caller says, mother in law, warfarin patient, has nose bleeds. She had one last week, and now having another one. 25+ min    Nurse Assessment  Nurse: Wayne Sever, RN, Tillie Rung Date/Time (Eastern Time): 01/06/2016 12:01:18 PM  Confirm and document reason for call. If symptomatic, describe symptoms. You must click the next button to save text entered. ---Caller states her mother in law is having a nose bleed right now. She states she is taking Coumadin. She has had the bleeding a few times recently.  Has the patient traveled out of the country within the last 30 days? ---Not Applicable  Does the patient have any new or worsening symptoms? ---Yes  Will a triage be completed? ---Yes  Related visit to physician within the last 2 weeks? ---No  Does the PT have any chronic conditions? (i.e. diabetes, asthma, etc.) ---Yes  List chronic conditions. ---Heart Arrhythmia, Mitral Valve Replacement  Is this a behavioral health or substance abuse call? ---No     Guidelines    Guideline Title Affirmed Question Affirmed Notes  Nosebleed [1] Large amount of blood has been lost (e.g., 1 cup or 240 ml) AND [2] bleeding now controlled (stopped)    Final Disposition User   See Physician within 24 Hours [place], RN, Tillie Rung    Comments  Scheduled on 10/17 at 1145 with Dr. Glori Bickers. She will go to ER today if bleeding does not stop in 30 minutes   Referrals  REFERRED TO PCP OFFICE   Disagree/Comply: Comply

## 2016-01-07 ENCOUNTER — Ambulatory Visit (INDEPENDENT_AMBULATORY_CARE_PROVIDER_SITE_OTHER): Payer: Medicare Other | Admitting: Family Medicine

## 2016-01-07 ENCOUNTER — Encounter: Payer: Self-pay | Admitting: Family Medicine

## 2016-01-07 VITALS — BP 106/64 | HR 90 | Temp 97.4°F | Ht 65.5 in | Wt 162.0 lb

## 2016-01-07 DIAGNOSIS — Z5181 Encounter for therapeutic drug level monitoring: Secondary | ICD-10-CM

## 2016-01-07 DIAGNOSIS — R040 Epistaxis: Secondary | ICD-10-CM

## 2016-01-07 DIAGNOSIS — I481 Persistent atrial fibrillation: Secondary | ICD-10-CM

## 2016-01-07 DIAGNOSIS — I4819 Other persistent atrial fibrillation: Secondary | ICD-10-CM

## 2016-01-07 LAB — CBC WITH DIFFERENTIAL/PLATELET
BASOS ABS: 0 10*3/uL (ref 0.0–0.1)
Basophils Relative: 0.6 % (ref 0.0–3.0)
EOS ABS: 0.2 10*3/uL (ref 0.0–0.7)
Eosinophils Relative: 4.3 % (ref 0.0–5.0)
HEMATOCRIT: 41.2 % (ref 36.0–46.0)
Hemoglobin: 13.9 g/dL (ref 12.0–15.0)
LYMPHS PCT: 37.6 % (ref 12.0–46.0)
Lymphs Abs: 1.7 10*3/uL (ref 0.7–4.0)
MCHC: 33.6 g/dL (ref 30.0–36.0)
MCV: 83.7 fl (ref 78.0–100.0)
Monocytes Absolute: 0.4 10*3/uL (ref 0.1–1.0)
Monocytes Relative: 7.7 % (ref 3.0–12.0)
NEUTROS ABS: 2.3 10*3/uL (ref 1.4–7.7)
NEUTROS PCT: 49.8 % (ref 43.0–77.0)
PLATELETS: 150 10*3/uL (ref 150.0–400.0)
RBC: 4.93 Mil/uL (ref 3.87–5.11)
RDW: 15 % (ref 11.5–15.5)
WBC: 4.6 10*3/uL (ref 4.0–10.5)

## 2016-01-07 LAB — POCT INR: INR: 2.5

## 2016-01-07 NOTE — Assessment & Plan Note (Signed)
This is overall stable Continues anticoagulation

## 2016-01-07 NOTE — Assessment & Plan Note (Signed)
Several episodes of brisk L sided bleeding for 20 or more minutes  Disc ways to stop nosebleeds-see AVS- use of nasal tampon and pressure and afrin  No source seen  On coumadin so threshold is low INR is 2.5 today= therapeutic  Pending cbc Ref to ENT

## 2016-01-07 NOTE — Progress Notes (Signed)
Subjective:    Patient ID: [name], female    DOB: 11-26-1932, 82 y.o.   MRN: [id number]  HPI Here with recurrent nosebleeds   Started 3 wk ago when sitting - L nostril / stopped in 15 min after pressure and packing and ice  Last week -much worse  She had a prolonged one on the 10th -called EMS- 20 minutes it stopped and they gave her some afrin type nose spray-it helped  Stepped out of the shower yesterday- another 20 min bleed from the L  Has not been blowing nose more than usual  Some clotting with it   She is wondering if she will need to be cauterized  She knows how to pinch it   Lower carb diet Eating better Wt Readings from Last 3 Encounters:  01/07/16 162 lb (73.5 kg)  12/18/15 160 lb (72.6 kg)  11/29/15 159 lb 4 oz (72.2 kg)   Happy with wt loss  Lost more than 10 lb  Feels better     Pulse is up today-she has not felt like she has a racing heart but knows it is irregular  Pulse Readings from Last 3 Encounters:  01/07/16 (!) 148  12/18/15 (!) 55  11/29/15 90    BP Readings from Last 3 Encounters:  01/07/16 106/64  12/18/15 112/70  11/29/15 134/82     On diltiazem 180 mg er Lab Results  Component Value Date   TSH 2.973 04/19/2015      Hx of a fib On coumadin Lab Results  Component Value Date   INR 2.3 12/19/2015   INR 2.8 11/07/2015   INR 2.8 09/26/2015    Patient Active Problem List   Diagnosis Date Noted  . Epistaxis 01/07/2016  . UTI (urinary tract infection) 11/29/2015  . Itching 10/04/2015  . Tick bite 08/22/2014  . Urticaria 08/22/2014  . Hip pain 08/02/2014  . Left knee pain 08/02/2014  . Chronic cough 05/08/2014  . Accidental overdose 04/26/2014  . Drug overdose 04/26/2014  . Abdominal muscle pain 04/16/2014  . Blood in stool 04/09/2014  . Caregiver stress 08/16/2013  . Colon cancer screening 08/16/2013  . Fatigue 08/16/2013  . Encounter for therapeutic drug monitoring 04/20/2013  . Left ovarian cyst 03/14/2013  .  Chest wall contusion 03/14/2013  . (HFpEF) heart failure with preserved ejection fraction ([place]) 12/27/2012  . Cardiomyopathy, secondary --Resolved again 10/14 10/13/2010  . COLONIC POLYPS, ADENOMATOUS, HX OF 09/18/2009  . ABDOMINAL PAIN 08/27/2009  . PULMONARY NODULE 12/20/2008  . GANGLION CYST 10/04/2007  . HYPERLIPIDEMIA 04/27/2007  . DEPRESSION 04/27/2007  . Asthma, mild intermittent 04/27/2007  . INSOMNIA 04/27/2007  . ADENOMATOUS COLONIC POLYP 11/04/2006  . Hypothyroidism 09/02/2006  . Atrial fibrillation ([place]) 08/05/2006   Past Medical History:  Diagnosis Date  . Allergic rhinitis   . 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 Asthma    "brought out by smoke, air quality"  . Atrial fibrillation -persistent    a. s/p PVI [name] 2010;  b. on tikosyn/coumadin;  c. 05/2009 Echo: EF 60-65%, Gr 2 DD.  . Cardiomyopathy    rate-related-resolved  . Carotid stenosis    mild (hosp 3/11)- consult by vasc/ Dr Donnetta Hutching  . Complication of anesthesia    "I have trouble waking up; I'm very sensitive to anesthesia"  . Depression   . Diverticulosis   . Fatty liver   . FH: colonic polyps   . Heart murmur    "prior to valve repair"  .  Hyperlipidemia   . Pneumonia 1951   "double"  . Pulmonary nodule   . Syncope and collapse   . TIA (transient ischemic attack) 2010   "left facial numbness after I drank a thermos of iced coffee; not sure it was a TIA"  . Tubular adenoma    Past Surgical History:  Procedure Laterality Date  . admit- syncope, bradycardia     cardiac and neuro work up  . APPENDECTOMY  1978  . AV NODE ABLATION     "2 @ [name]; 1 @ [place]"  . carotid doppler  10/07   no stenosis  . CT of abd and pelvis  10/07   negative  . DILATION AND CURETTAGE OF UTERUS    . EXCISIONAL HEMORRHOIDECTOMY  1990's  . exercise stress test  2/07  . gross hematuria on anticoagulants  10/07   neg urol work up   . MITRAL VALVE ANNULOPLASTY  10/23/1998   "Model 4625; Seriel [id number]"; size 10mm; [facility]; Dr. Boyce Medici  . PILONIDAL CYST EXCISION  1954  . TUBAL LIGATION  1978   Social History  Substance Use Topics  . Smoking status: Never Smoker  . Smokeless tobacco: Never Used  . Alcohol use 0.0 oz/week     Comment: 04/26/2014 "occasional glass of wine; a few times/yr"   Family History  Problem Relation Age of Onset  . Hypertension Mother   . Lung cancer Father     smoker  . Alcohol abuse Father   . Cancer Father     bladder and lung CA smoker  . Breast cancer Neg Hx    Allergies  Allergen Reactions  . Amiodarone Hcl Swelling  . Statins     REACTION: rash  . Penicillins Rash    REACTION: rash   Current Outpatient Prescriptions on File Prior to Visit  Medication Sig Dispense Refill  . albuterol (PROVENTIL HFA;VENTOLIN HFA) 108 (90 BASE) MCG/ACT inhaler Inhale 2 puffs into the lungs every 4 (four) hours as needed for wheezing or shortness of breath. 1 Inhaler 0  . benzonatate (TESSALON) 100 MG capsule Take 1 capsule (100 mg total) by mouth 2 (two) times daily as needed for cough. 20 capsule 0  . diltiazem (CARTIA XT) 180 MG 24 hr capsule Take 1 capsule (180 mg total) by mouth daily. 90 capsule 1  . fluorouracil (EFUDEX) 5 % cream Apply 1 application topically 2 (two) times daily.    . furosemide (LASIX) 40 MG tablet Take one-half tablet by mouth daily may, increase to one tablet by mouth daily if needed 90 tablet 3  . levothyroxine (SYNTHROID, LEVOTHROID) 25 MCG tablet TAKE 1 TABLET EVERY DAY BEFORE BREAKFAST 90 tablet 1  . loratadine (CLARITIN) 10 MG tablet Take 10 mg by mouth daily as needed for rhinitis.     [name] warfarin (COUMADIN) 5 MG tablet TAKE AS DIRECTED BY ANTI COAGULATION CLINIC 90 tablet 3   No current facility-administered medications on file prior to visit.      Review of Systems Review of Systems  Constitutional: Negative for fever, appetite change, fatigue and unexpected weight change.  ENT neg for cong/rhinorrea/sinus or ear pain, pos for L sided  nosebleeds  Eyes: Negative for pain and visual disturbance.  Respiratory: Negative for cough and shortness of breath.   Cardiovascular: Negative for cp or palpitations    Gastrointestinal: Negative for nausea, diarrhea and constipation.  Genitourinary: Negative for urgency and frequency.  Skin: Negative for pallor or rash   Neurological: Negative for  weakness, , numbness and headaches. pos for mild light headedness today Hematological: Negative for adenopathy. Does not bruise/bleed easily.  Psychiatric/Behavioral: Negative for dysphoric mood. The patient is not nervous/anxious.         Objective:   Physical Exam  Constitutional: She appears well-developed and well-nourished. No distress.  HENT:  Head: Normocephalic and atraumatic.  Right Ear: External ear normal.  Left Ear: External ear normal.  Mouth/Throat: Oropharynx is clear and moist. No oropharyngeal exudate.  Nares are boggy  No scabs seen in either nare/no clots or signs of recent bleeding  Nl app throat    Eyes: Conjunctivae and EOM are normal. Pupils are equal, round, and reactive to light. Right eye exhibits no discharge. Left eye exhibits no discharge. No scleral icterus.  Neck: Normal range of motion. Neck supple. No JVD present. Carotid bruit is not present. No thyromegaly present.  Cardiovascular: Normal rate, normal heart sounds and intact distal pulses.  Exam reveals no gallop.   irreg irreg rhythm -rate of 90  Pulmonary/Chest: Effort normal and breath sounds normal. No respiratory distress. She has no wheezes. She has no rales.  No crackles  Abdominal: Soft. Bowel sounds are normal. She exhibits no distension, no abdominal bruit and no mass. There is no tenderness.  Musculoskeletal: She exhibits no edema.  Lymphadenopathy:    She has no cervical adenopathy.  Neurological: She is alert. She has normal reflexes.  Skin: Skin is warm and dry. No rash noted. No pallor.  Psychiatric: She has a normal mood and affect.            Assessment & Plan:   Problem List Items Addressed This Visit      Cardiovascular and Mediastinum   Atrial fibrillation ([place])    This is overall stable Continues anticoagulation        Respiratory   Epistaxis    Several episodes of brisk L sided bleeding for 20 or more minutes  Disc ways to stop nosebleeds-see AVS- use of nasal tampon and pressure and afrin  No source seen  On coumadin so threshold is low INR is 2.5 today= therapeutic  Pending cbc Ref to ENT      Relevant Orders   Ambulatory referral to ENT   CBC with Differential/Platelet     Other   Encounter for therapeutic drug monitoring    Other Visit Diagnoses   None.

## 2016-01-07 NOTE — Patient Instructions (Signed)
Lab today  INR today  You may need to have an area cauterized Stop at check out for referral to ENT If you have another nosebleed- try an over the counter tampon (the type without an applicator) - and also afrin nasal spray is ok to try if needed (do not use every day)- pinch firmly for 10-15 minutes without stopping

## 2016-01-10 DIAGNOSIS — R040 Epistaxis: Secondary | ICD-10-CM | POA: Diagnosis not present

## 2016-01-15 DIAGNOSIS — R040 Epistaxis: Secondary | ICD-10-CM | POA: Diagnosis not present

## 2016-01-27 DIAGNOSIS — M9905 Segmental and somatic dysfunction of pelvic region: Secondary | ICD-10-CM | POA: Diagnosis not present

## 2016-01-27 DIAGNOSIS — M5416 Radiculopathy, lumbar region: Secondary | ICD-10-CM | POA: Diagnosis not present

## 2016-01-27 DIAGNOSIS — M9903 Segmental and somatic dysfunction of lumbar region: Secondary | ICD-10-CM | POA: Diagnosis not present

## 2016-01-27 DIAGNOSIS — M955 Acquired deformity of pelvis: Secondary | ICD-10-CM | POA: Diagnosis not present

## 2016-01-30 ENCOUNTER — Other Ambulatory Visit: Payer: Medicare Other

## 2016-02-17 ENCOUNTER — Other Ambulatory Visit (INDEPENDENT_AMBULATORY_CARE_PROVIDER_SITE_OTHER): Payer: Medicare Other

## 2016-02-17 DIAGNOSIS — M5416 Radiculopathy, lumbar region: Secondary | ICD-10-CM | POA: Diagnosis not present

## 2016-02-17 DIAGNOSIS — Z5181 Encounter for therapeutic drug level monitoring: Secondary | ICD-10-CM | POA: Diagnosis not present

## 2016-02-17 DIAGNOSIS — M9903 Segmental and somatic dysfunction of lumbar region: Secondary | ICD-10-CM | POA: Diagnosis not present

## 2016-02-17 DIAGNOSIS — I481 Persistent atrial fibrillation: Secondary | ICD-10-CM | POA: Diagnosis not present

## 2016-02-17 DIAGNOSIS — M955 Acquired deformity of pelvis: Secondary | ICD-10-CM | POA: Diagnosis not present

## 2016-02-17 DIAGNOSIS — I4819 Other persistent atrial fibrillation: Secondary | ICD-10-CM

## 2016-02-17 DIAGNOSIS — M9905 Segmental and somatic dysfunction of pelvic region: Secondary | ICD-10-CM | POA: Diagnosis not present

## 2016-02-17 LAB — POCT INR: INR: 3

## 2016-02-27 DIAGNOSIS — M5416 Radiculopathy, lumbar region: Secondary | ICD-10-CM | POA: Diagnosis not present

## 2016-02-27 DIAGNOSIS — M955 Acquired deformity of pelvis: Secondary | ICD-10-CM | POA: Diagnosis not present

## 2016-02-27 DIAGNOSIS — M9905 Segmental and somatic dysfunction of pelvic region: Secondary | ICD-10-CM | POA: Diagnosis not present

## 2016-02-27 DIAGNOSIS — M9903 Segmental and somatic dysfunction of lumbar region: Secondary | ICD-10-CM | POA: Diagnosis not present

## 2016-03-11 ENCOUNTER — Encounter: Payer: Self-pay | Admitting: Family Medicine

## 2016-03-11 ENCOUNTER — Ambulatory Visit (INDEPENDENT_AMBULATORY_CARE_PROVIDER_SITE_OTHER): Payer: Medicare Other | Admitting: Family Medicine

## 2016-03-11 VITALS — BP 128/68 | HR 97 | Temp 98.0°F | Ht 65.5 in | Wt 161.0 lb

## 2016-03-11 DIAGNOSIS — R05 Cough: Secondary | ICD-10-CM

## 2016-03-11 DIAGNOSIS — Z636 Dependent relative needing care at home: Secondary | ICD-10-CM | POA: Diagnosis not present

## 2016-03-11 DIAGNOSIS — R059 Cough, unspecified: Secondary | ICD-10-CM

## 2016-03-11 DIAGNOSIS — J208 Acute bronchitis due to other specified organisms: Secondary | ICD-10-CM

## 2016-03-11 DIAGNOSIS — J209 Acute bronchitis, unspecified: Secondary | ICD-10-CM | POA: Insufficient documentation

## 2016-03-11 MED ORDER — WARFARIN SODIUM 5 MG PO TABS: ORAL_TABLET | ORAL | 0 refills | Status: DC

## 2016-03-11 MED ORDER — AZITHROMYCIN 250 MG PO TABS: ORAL_TABLET | ORAL | 0 refills | Status: DC

## 2016-03-11 MED ORDER — BENZONATATE 200 MG PO CAPS: 200.0000 mg | ORAL_CAPSULE | Freq: Three times a day (TID) | ORAL | 2 refills | Status: DC | PRN

## 2016-03-11 NOTE — Patient Instructions (Addendum)
When you are totally better make a nurse appt for the prevnar pneumonia vaccine   Drink fluids  Take the claritin for cough as well as the tessalon  Use inhaler as needed  Update me if more wheezing   If not continuing to improve-fill px for zithromax and let us know (because of coumadin)

## 2016-03-11 NOTE — Progress Notes (Signed)
Pre visit review using our clinic review tool, if applicable. No additional management support is needed unless otherwise documented below in the visit note. 

## 2016-03-11 NOTE — Progress Notes (Signed)
Subjective:    Patient ID: [name], female    DOB: 1932/08/31, 82 y.o.   MRN: [id number]  HPI   Here for uri symptoms - the end of the flu  She started getting sick on Sunday 12/10  She was sick to come in  [place] chertussin left over for cough  Tylenol and fluids and rest and vics vapor rub   Fever - assume was pretty high - chills and sweats and no appetite for the week  Kept drinking water  Could not drive   Needs refill on the tessalon Prod cough with pnd - most of it is clear  Blowing her nose -taking claritin  Chest was really sore from the cough    Had the flu (was really sick)  Had to miss a flight - had a plane ticket for 12/14-12/18 to [place] - could not make it for her son's graduation   Her mother is dying in the hospital  Going for her last rites today  82 years old    Needs refill of coumadin Lab Results  Component Value Date   INR 3.0 02/17/2016   INR 2.5 01/07/2016   INR 2.3 12/19/2015     Patient Active Problem List   Diagnosis Date Noted  . Acute bronchitis 03/11/2016  . Epistaxis 01/07/2016  . Tick bite 08/22/2014  . Urticaria 08/22/2014  . Hip pain 08/02/2014  . Left knee pain 08/02/2014  . Chronic cough 05/08/2014  . Blood in stool 04/09/2014  . Caregiver stress 08/16/2013  . Colon cancer screening 08/16/2013  . Encounter for therapeutic drug monitoring 04/20/2013  . Left ovarian cyst 03/14/2013  . (HFpEF) heart failure with preserved ejection fraction ([place]) 12/27/2012  . Cardiomyopathy, secondary --Resolved again 10/14 10/13/2010  . COLONIC POLYPS, ADENOMATOUS, HX OF 09/18/2009  . PULMONARY NODULE 12/20/2008  . GANGLION CYST 10/04/2007  . HYPERLIPIDEMIA 04/27/2007  . DEPRESSION 04/27/2007  . Asthma, mild intermittent 04/27/2007  . INSOMNIA 04/27/2007  . ADENOMATOUS COLONIC POLYP 11/04/2006  . Hypothyroidism 09/02/2006  . Atrial fibrillation ([place]) 08/05/2006   Past Medical History:  Diagnosis Date  . Allergic rhinitis   .  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 Asthma    "brought out by smoke, air quality"  . Atrial fibrillation -persistent    a. s/p PVI [name] 2010;  b. on tikosyn/coumadin;  c. 05/2009 Echo: EF 60-65%, Gr 2 DD.  . Cardiomyopathy    rate-related-resolved  . Carotid stenosis    mild (hosp 3/11)- consult by vasc/ Dr Donnetta Hutching  . Complication of anesthesia    "I have trouble waking up; I'm very sensitive to anesthesia"  . Depression   . Diverticulosis   . Fatty liver   . FH: colonic polyps   . Heart murmur    "prior to valve repair"  . Hyperlipidemia   . Pneumonia 1951   "double"  . Pulmonary nodule   . Syncope and collapse   . TIA (transient ischemic attack) 2010   "left facial numbness after I drank a thermos of iced coffee; not sure it was a TIA"  . Tubular adenoma    Past Surgical History:  Procedure Laterality Date  . admit- syncope, bradycardia     cardiac and neuro work up  . APPENDECTOMY  1978  . AV NODE ABLATION     "2 @ [name]; 1 @ [place]"  . carotid doppler  10/07   no stenosis  . CT of abd and pelvis  10/07  negative  . DILATION AND CURETTAGE OF UTERUS    . EXCISIONAL HEMORRHOIDECTOMY  1990's  . exercise stress test  2/07  . gross hematuria on anticoagulants  10/07   neg urol work up   . MITRAL VALVE ANNULOPLASTY  10/23/1998   "Model 4625; Seriel [id number]"; size 41mm; [facility]; Dr. Boyce Medici  . PILONIDAL CYST EXCISION  1954  . TUBAL LIGATION  1978   Social History  Substance Use Topics  . Smoking status: Never Smoker  . Smokeless tobacco: Never Used  . Alcohol use 0.0 oz/week     Comment: 04/26/2014 "occasional glass of wine; a few times/yr"   Family History  Problem Relation Age of Onset  . Hypertension Mother   . Lung cancer Father     smoker  . Alcohol abuse Father   . Cancer Father     bladder and lung CA smoker  . Breast cancer Neg Hx    Allergies  Allergen Reactions  . Amiodarone Hcl Swelling  . Statins     REACTION: rash  . Penicillins Rash     REACTION: rash   Current Outpatient Prescriptions on File Prior to Visit  Medication Sig Dispense Refill  . albuterol (PROVENTIL HFA;VENTOLIN HFA) 108 (90 BASE) MCG/ACT inhaler Inhale 2 puffs into the lungs every 4 (four) hours as needed for wheezing or shortness of breath. 1 Inhaler 0  . benzonatate (TESSALON) 100 MG capsule Take 1 capsule (100 mg total) by mouth 2 (two) times daily as needed for cough. 20 capsule 0  . diltiazem (CARTIA XT) 180 MG 24 hr capsule Take 1 capsule (180 mg total) by mouth daily. 90 capsule 1  . fluorouracil (EFUDEX) 5 % cream Apply 1 application topically 2 (two) times daily.    . furosemide (LASIX) 40 MG tablet Take one-half tablet by mouth daily may, increase to one tablet by mouth daily if needed 90 tablet 3  . levothyroxine (SYNTHROID, LEVOTHROID) 25 MCG tablet TAKE 1 TABLET EVERY DAY BEFORE BREAKFAST 90 tablet 1  . loratadine (CLARITIN) 10 MG tablet Take 10 mg by mouth daily as needed for rhinitis.      No current facility-administered medications on file prior to visit.     Review of Systems Review of Systems  Constitutional: Negative for fever, appetite change, fatigue and unexpected weight change.  Eyes: Negative for pain and visual disturbance.  ENT pos for pnd/ neg for st or sinus pain  Respiratory: Negative for  shortness of breath.  pos for cough and slt wheeze  Cardiovascular: Negative for cp or palpitations    Gastrointestinal: Negative for nausea, diarrhea and constipation.  Genitourinary: Negative for urgency and frequency.  Skin: Negative for pallor or rash   Neurological: Negative for weakness, light-headedness, numbness and headaches.  Hematological: Negative for adenopathy. Does not bruise/bleed easily.  Psychiatric/Behavioral: Negative for dysphoric mood. Pos for stressor- her mother is in the hospital poss terminal       Objective:   Physical Exam  Constitutional: She appears well-developed and well-nourished. No distress.  Fatigued  appearing but well   HENT:  Head: Normocephalic and atraumatic.  Right Ear: External ear normal.  Left Ear: External ear normal.  Mouth/Throat: Oropharynx is clear and moist.  Nares are boggy Scant clear pnd  No sinus tenderness  Eyes: Conjunctivae and EOM are normal. Pupils are equal, round, and reactive to light. Right eye exhibits no discharge. Left eye exhibits no discharge.  Neck: Normal range of motion. Neck supple.  Cardiovascular: Normal  rate and regular rhythm.   Pulmonary/Chest: Effort normal and breath sounds normal. No respiratory distress. She has no rales.  Scant wheeze only on forced expiration  Few scattered rhonchi No rales Good air exch Nl exp phase Not sob  Lymphadenopathy:    She has no cervical adenopathy.  Neurological: She is alert. No cranial nerve deficit.  Skin: Skin is warm and dry. No rash noted. No erythema. No pallor.  Psychiatric: She has a normal mood and affect.  Pleasant but overall seems tired and stressed           Assessment & Plan:   Problem List Items Addressed This Visit      Respiratory   Acute bronchitis    S/p suspected influenza  She is improved Persistent cough -px tessalon 200 mg for this which helps  No longer has fever or aches  Re assuring exam  If symptoms worsen-will fill px for zpack and call  If inc in wheeze will req prednisone  Letter written regarding missed airline flight due to illness  Update if not starting to improve in a week or if worsening          Other   Caregiver stress    Pt has elderly mother in hospital- may be dying Offered any support or help we can offer        Other Visit Diagnoses    Cough

## 2016-03-12 DIAGNOSIS — M955 Acquired deformity of pelvis: Secondary | ICD-10-CM | POA: Diagnosis not present

## 2016-03-12 DIAGNOSIS — M9905 Segmental and somatic dysfunction of pelvic region: Secondary | ICD-10-CM | POA: Diagnosis not present

## 2016-03-12 DIAGNOSIS — M5416 Radiculopathy, lumbar region: Secondary | ICD-10-CM | POA: Diagnosis not present

## 2016-03-12 DIAGNOSIS — M9903 Segmental and somatic dysfunction of lumbar region: Secondary | ICD-10-CM | POA: Diagnosis not present

## 2016-03-12 NOTE — Assessment & Plan Note (Signed)
Pt has elderly mother in hospital- may be dying Offered any support or help we can offer

## 2016-03-12 NOTE — Assessment & Plan Note (Signed)
S/p suspected influenza  She is improved Persistent cough -px tessalon 200 mg for this which helps  No longer has fever or aches  Re assuring exam  If symptoms worsen-will fill px for zpack and call  If inc in wheeze will req prednisone  Letter written regarding missed airline flight due to illness  Update if not starting to improve in a week or if worsening

## 2016-03-20 ENCOUNTER — Ambulatory Visit (INDEPENDENT_AMBULATORY_CARE_PROVIDER_SITE_OTHER): Payer: Medicare Other

## 2016-03-20 DIAGNOSIS — Z5181 Encounter for therapeutic drug level monitoring: Secondary | ICD-10-CM | POA: Diagnosis not present

## 2016-03-20 DIAGNOSIS — I4891 Unspecified atrial fibrillation: Secondary | ICD-10-CM | POA: Diagnosis not present

## 2016-03-20 LAB — POCT INR: INR: 3.9

## 2016-03-20 NOTE — Patient Instructions (Addendum)
Pre visit review using our clinic review tool, if applicable. No additional management support is needed unless otherwise documented below in the visit note.   INR today 3.9.  Patient reports recent, acute illness in past week and on abx (azithromycin) therapy X4 days. (completed on 12/24).  She did note some additional, new bruising to arm after a mild injury this week but no complications.  She denies any signs of abnormal or uncontrollable bleeding and denies any blood in stool/urine.  Diet has been consistent, except for recent decrease through illness.  She is now back to her normal routine and feeling much better.  Due to recent acute illness and abx use, patient is to hold dose tomorrow (as she has already taken today) and then resume prior dosing schedule of 5mg  daily except for 2.5mg  on Mondays only and recheck INR in 2 weeks.  Patient educated on risks associated with supratherapeutic level and if any unusual or uncontrollable bruising or bleeding develops she is to go to the Er.  Patient verbalizes understanding of all instructions given today.

## 2016-04-03 ENCOUNTER — Ambulatory Visit (INDEPENDENT_AMBULATORY_CARE_PROVIDER_SITE_OTHER): Payer: Medicare Other

## 2016-04-03 DIAGNOSIS — I4891 Unspecified atrial fibrillation: Secondary | ICD-10-CM | POA: Diagnosis not present

## 2016-04-03 DIAGNOSIS — Z5181 Encounter for therapeutic drug level monitoring: Secondary | ICD-10-CM | POA: Diagnosis not present

## 2016-04-03 DIAGNOSIS — I481 Persistent atrial fibrillation: Secondary | ICD-10-CM

## 2016-04-03 DIAGNOSIS — I4819 Other persistent atrial fibrillation: Secondary | ICD-10-CM

## 2016-04-03 LAB — POCT INR: INR: 3.6

## 2016-04-03 NOTE — Patient Instructions (Signed)
Pre visit review using our clinic review tool, if applicable. No additional management support is needed unless otherwise documented below in the visit note.  INR today 3.6  Patient denies any changes in diet other than did have some cranberry in an occasional salad or in a snack nut mixture that she eats.  She will now avoid cranberry as educated by this Probation officer on the interaction risk with coumadin.  We also reviewed in detail her diet and identified foods which may interact with her coumadin regimen. Patient does report feeling much better since recent flu illness over past month and denies any current changes in medication regimen.  She has completed the antibiotic course that she was on for illness.  Patient denies any unusual bruising or bleeding and is aware of risks associated with supratherapeutic level.  She will go to ER if any unusual bruising or bleeding develops.  Patient will hold coumadin tomorrow (as she has already taken dose today) and then take 5mg  daily except for 2.5mg  on Mondays and Fridays now.  Recheck in 2 weeks.  Patient verbalizes understanding of all instructions given today.

## 2016-04-17 ENCOUNTER — Ambulatory Visit: Payer: Medicare Other

## 2016-04-24 ENCOUNTER — Ambulatory Visit (INDEPENDENT_AMBULATORY_CARE_PROVIDER_SITE_OTHER): Payer: Medicare Other

## 2016-04-24 DIAGNOSIS — I481 Persistent atrial fibrillation: Secondary | ICD-10-CM | POA: Diagnosis not present

## 2016-04-24 DIAGNOSIS — I4891 Unspecified atrial fibrillation: Secondary | ICD-10-CM

## 2016-04-24 DIAGNOSIS — Z5181 Encounter for therapeutic drug level monitoring: Secondary | ICD-10-CM

## 2016-04-24 DIAGNOSIS — I4819 Other persistent atrial fibrillation: Secondary | ICD-10-CM

## 2016-04-24 LAB — POCT INR: INR: 2.2

## 2016-04-24 NOTE — Patient Instructions (Signed)
Pre visit review using our clinic review tool, if applicable. No additional management support is needed unless otherwise documented below in the visit note. 

## 2016-05-22 ENCOUNTER — Ambulatory Visit (INDEPENDENT_AMBULATORY_CARE_PROVIDER_SITE_OTHER): Payer: Medicare Other

## 2016-05-22 DIAGNOSIS — I4891 Unspecified atrial fibrillation: Secondary | ICD-10-CM

## 2016-05-22 DIAGNOSIS — I481 Persistent atrial fibrillation: Secondary | ICD-10-CM | POA: Diagnosis not present

## 2016-05-22 DIAGNOSIS — I4819 Other persistent atrial fibrillation: Secondary | ICD-10-CM

## 2016-05-22 DIAGNOSIS — Z5181 Encounter for therapeutic drug level monitoring: Secondary | ICD-10-CM

## 2016-05-22 LAB — POCT INR: INR: 2.5

## 2016-05-22 NOTE — Patient Instructions (Signed)
Pre visit review using our clinic review tool, if applicable. No additional management support is needed unless otherwise documented below in the visit note. 

## 2016-06-05 ENCOUNTER — Ambulatory Visit (INDEPENDENT_AMBULATORY_CARE_PROVIDER_SITE_OTHER): Payer: Medicare Other | Admitting: Physician Assistant

## 2016-06-05 VITALS — BP 118/78 | HR 62 | Ht 65.5 in | Wt 169.0 lb

## 2016-06-05 DIAGNOSIS — I482 Chronic atrial fibrillation: Secondary | ICD-10-CM | POA: Diagnosis not present

## 2016-06-05 DIAGNOSIS — I10 Essential (primary) hypertension: Secondary | ICD-10-CM | POA: Diagnosis not present

## 2016-06-05 DIAGNOSIS — Z79899 Other long term (current) drug therapy: Secondary | ICD-10-CM | POA: Diagnosis not present

## 2016-06-05 DIAGNOSIS — I4821 Permanent atrial fibrillation: Secondary | ICD-10-CM

## 2016-06-05 NOTE — Progress Notes (Addendum)
Cardiology Office Note Date:  06/05/2016  Patient ID:  [name], [name] 1933-02-20, MRN [id number] PCP:  [name], MD  Cardiologist:  Dr. Caryl Comes   Chief Complaint: planned routine 6 month f/u  History of Present Illness: [name] is a 83 y.o. female with history of permanent AFib, asthma, HLD, TIA, AFlutter w/ ablation related to prior atriotomy undertaken at [facility] in March 2010, and now permanent Afib, comes to the office today to be seen for Dr. Caryl Comes.  She was last seen by him in July, at that time, her digoxin was stopped, was doing well.  She comes today continues to feel well, she has no exertional intolerances or complaints, she was at a conference recently and felt SOB after walking up a very steep incline, no other observations or symptoms,  No CP, palpitations, no near syncope or syncope  No bleeding or signs of bleeding says she has done well with her warfarin, monitors via the coumadin clinic.  Unfortunately her mother, passed away in April 17, 2023 at 81 y/o.   AF history AAD hx Tikosyn d/c with progression to permanent AF  Past Medical History:  Diagnosis Date  . Allergic rhinitis   . 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 Asthma    "brought out by smoke, air quality"  . Atrial fibrillation -persistent    a. s/p PVI [name] 2010;  b. on tikosyn/coumadin;  c. 05/2009 Echo: EF 60-65%, Gr 2 DD.  . Cardiomyopathy    rate-related-resolved  . Carotid stenosis    mild (hosp 3/11)- consult by vasc/ Dr Donnetta Hutching  . Complication of anesthesia    "I have trouble waking up; I'm very sensitive to anesthesia"  . Depression   . Diverticulosis   . Fatty liver   . FH: colonic polyps   . Heart murmur    "prior to valve repair"  . Hyperlipidemia   . Pneumonia 1951   "double"  . Pulmonary nodule   . Syncope and collapse   . TIA (transient ischemic attack) 2010   "left facial numbness after I drank a thermos of iced coffee; not sure it was a TIA"  . Tubular adenoma     Past Surgical  History:  Procedure Laterality Date  . admit- syncope, bradycardia     cardiac and neuro work up  . APPENDECTOMY  1978  . AV NODE ABLATION     "2 @ [name]; 1 @ [place]"  . carotid doppler  10/07   no stenosis  . CT of abd and pelvis  10/07   negative  . DILATION AND CURETTAGE OF UTERUS    . EXCISIONAL HEMORRHOIDECTOMY  1990's  . exercise stress test  2/07  . gross hematuria on anticoagulants  10/07   neg urol work up   . MITRAL VALVE ANNULOPLASTY  10/23/1998   "Model 4625; Seriel [id number]"; size 21mm; [facility]; Dr. Boyce Medici  . PILONIDAL CYST EXCISION  1954  . TUBAL LIGATION  1978    Current Outpatient Prescriptions  Medication Sig Dispense Refill  . albuterol (PROVENTIL HFA;VENTOLIN HFA) 108 (90 BASE) MCG/ACT inhaler Inhale 2 puffs into the lungs every 4 (four) hours as needed for wheezing or shortness of breath. 1 Inhaler 0  . benzonatate (TESSALON) 200 MG capsule Take 1 capsule (200 mg total) by mouth 3 (three) times daily as needed for cough. Swallow whole, do not bite pill 30 capsule 2  . diltiazem (CARTIA XT) 180 MG 24 hr capsule Take 1 capsule (180 mg  total) by mouth daily. 90 capsule 1  . fluorouracil (EFUDEX) 5 % cream Apply 1 application topically 2 (two) times daily.    . furosemide (LASIX) 40 MG tablet Take one-half tablet by mouth daily may, increase to one tablet by mouth daily if needed 90 tablet 3  . levothyroxine (SYNTHROID, LEVOTHROID) 25 MCG tablet TAKE 1 TABLET EVERY DAY BEFORE BREAKFAST 90 tablet 1  . loratadine (CLARITIN) 10 MG tablet Take 10 mg by mouth daily as needed for rhinitis.     [name] warfarin (COUMADIN) 5 MG tablet TAKE AS DIRECTED BY ANTI COAGULATION CLINIC 30 tablet 0   No current facility-administered medications for this visit.     Allergies:   Amiodarone hcl; Statins; and Penicillins   Social History:  The patient  reports that she has never smoked. She has never used smokeless tobacco. She reports that she drinks alcohol. She reports that she  does not use drugs.   Family History:  The patient's family history includes Alcohol abuse in her father; Cancer in her father; Hypertension in her mother; Lung cancer in her father.  ROS:  Please see the history of present illness.  All other systems are reviewed and otherwise negative.   PHYSICAL EXAM:  VS:  BP 118/78   Pulse 62   Ht 5' 5.5" (1.664 m)   Wt 169 lb (76.7 kg)   BMI 27.70 kg/m  BMI: Body mass index is 27.7 kg/m. Well nourished, well developed, in no acute distress  HEENT: normocephalic, atraumatic  Neck: no JVD, carotid bruits or masses Cardiac:  IRRR; no significant murmurs, no rubs, or gallops Lungs:  CTA b/l, no wheezing, rhonchi or rales  Abd: soft, nontender MS: no deformity or atrophy Ext: no edema  Skin: warm and dry, no rash Neuro:  No gross deficits appreciated Psych: euthymic mood, full affect   EKG:  Done 10/21/15 was AFib, 81bpm, QRS 138ms, QTc 421ms 05/01/15: TTE Study Conclusions - Left ventricle: Septal and apical hypokinesis The cavity size was   normal. Wall thickness was normal. Systolic function was normal.   The estimated ejection fraction was in the range of 50% to 55%. - Mitral valve: Post mitral valve repair with mild central MR. - Left atrium: The atrium was severely dilated. - Right atrium: The atrium was mildly dilated. - Atrial septum: No defect or patent foramen ovale was identified.  Recent Labs: 01/07/2016: Hemoglobin 13.9; Platelets 150.0  No results found for requested labs within last 8760 hours.   CrCl cannot be calculated (Patient's most recent lab result is older than the maximum 21 days allowed.).   Wt Readings from Last 3 Encounters:  06/05/16 169 lb (76.7 kg)  03/11/16 161 lb (73 kg)  01/07/16 162 lb (73.5 kg)     Other studies reviewed: Additional studies/records reviewed today include: summarized above  ASSESSMENT AND PLAN:  1. Permanent AFib     CHA2DS2Vasc is 6, on warfarin, monitored and managed with the  coumadin clinic  2. HTN     looks good, no changes   Disposition: F/u with Dr. Caryl Comes in 6 months, sooner if needed, will get BMET and CBC today given her meds.  Current medicines are reviewed at length with the patient today.  The patient did not have any concerns regarding medicines.  Haywood Lasso, PA-C 06/05/2016 1:46 PM     [address] [place] [address] [phone] (office)  [phone] (fax)

## 2016-06-05 NOTE — Patient Instructions (Signed)
Medication Instructions:   Your physician recommends that you continue on your current medications as directed. Please refer to the Current Medication list given to you today.    If you need a refill on your cardiac medications before your next appointment, please call your pharmacy.  Labwork: BMET AND CBC TODAY    Testing/Procedures: NONE ORDERED  TODAY    Follow-Up:  Your physician wants you to follow-up in:  IN [place] will receive a reminder letter in the mail two months in advance. If you don't receive a letter, please call our office to schedule the follow-up appointment.      Any Other Special Instructions Will Be Listed Below (If Applicable).

## 2016-06-06 LAB — BASIC METABOLIC PANEL
BUN/Creatinine Ratio: 26 (ref 12–28)
BUN: 27 mg/dL (ref 8–27)
CALCIUM: 9.8 mg/dL (ref 8.7–10.3)
CHLORIDE: 97 mmol/L (ref 96–106)
CO2: 30 mmol/L — AB (ref 18–29)
Creatinine, Ser: 1.02 mg/dL — ABNORMAL HIGH (ref 0.57–1.00)
GFR calc non Af Amer: 52 mL/min/{1.73_m2} — ABNORMAL LOW (ref >59)
GFR, EST AFRICAN AMERICAN: 60 mL/min/{1.73_m2} (ref >59)
GLUCOSE: 70 mg/dL (ref 65–99)
POTASSIUM: 4.2 mmol/L (ref 3.5–5.2)
Sodium: 141 mmol/L (ref 134–144)

## 2016-06-06 LAB — CBC
HEMATOCRIT: 40.2 % (ref 34.0–46.6)
HEMOGLOBIN: 13.6 g/dL (ref 11.1–15.9)
MCH: 28.2 pg (ref 26.6–33.0)
MCHC: 33.8 g/dL (ref 31.5–35.7)
MCV: 83 fL (ref 79–97)
Platelets: 168 10*3/uL (ref 150–379)
RBC: 4.82 x10E6/uL (ref 3.77–5.28)
RDW: 14.7 % (ref 12.3–15.4)
WBC: 5.4 10*3/uL (ref 3.4–10.8)

## 2016-06-17 ENCOUNTER — Encounter: Payer: Self-pay | Admitting: Family Medicine

## 2016-06-17 ENCOUNTER — Ambulatory Visit (INDEPENDENT_AMBULATORY_CARE_PROVIDER_SITE_OTHER): Payer: Medicare Other | Admitting: Family Medicine

## 2016-06-17 VITALS — BP 110/68 | HR 88 | Temp 97.8°F | Wt 169.0 lb

## 2016-06-17 DIAGNOSIS — J069 Acute upper respiratory infection, unspecified: Secondary | ICD-10-CM | POA: Diagnosis not present

## 2016-06-17 MED ORDER — AZITHROMYCIN 250 MG PO TABS: ORAL_TABLET | ORAL | 0 refills | Status: DC

## 2016-06-17 NOTE — Progress Notes (Signed)
Pre visit review using our clinic review tool, if applicable. No additional management support is needed unless otherwise documented below in the visit note. 

## 2016-06-17 NOTE — Patient Instructions (Signed)
IF you need to start Azithromycin, take 1/2 tablet of your coumadin and come in next week for INR  Increase fluids  Vitamin C 1,000 mg

## 2016-06-17 NOTE — Progress Notes (Signed)
Subjective:    Patient ID: [name], female    DOB: [birth year], 83 y.o.   MRN: [id number]  HPI This is a 83 yo female who presents today with cough and bilateral pressure and itching x 2-3 days. Started with raspy throat and itchy ears. No aches, no fever, doesn't feel bad. Little nasal drainage. Taking Claritin. Is going to [facility] 06/28/16. Is concerned with travel while being ill. Was babysitting grandchild who had a runny nose.  Last INR 2.2 (goal 2-3).    Past Medical History:  Diagnosis Date  . Allergic rhinitis   . 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 Asthma    "brought out by smoke, air quality"  . Atrial fibrillation -persistent    a. s/p PVI [name] 2010;  b. on tikosyn/coumadin;  c. 05/2009 Echo: EF 60-65%, Gr 2 DD.  . Cardiomyopathy    rate-related-resolved  . Carotid stenosis    mild (hosp 3/11)- consult by vasc/ Dr Donnetta Hutching  . Complication of anesthesia    "I have trouble waking up; I'm very sensitive to anesthesia"  . Depression   . Diverticulosis   . Fatty liver   . FH: colonic polyps   . Heart murmur    "prior to valve repair"  . Hyperlipidemia   . Pneumonia 1951   "double"  . Pulmonary nodule   . Syncope and collapse   . TIA (transient ischemic attack) 2010   "left facial numbness after I drank a thermos of iced coffee; not sure it was a TIA"  . Tubular adenoma    Past Surgical History:  Procedure Laterality Date  . admit- syncope, bradycardia     cardiac and neuro work up  . APPENDECTOMY  1978  . AV NODE ABLATION     "2 @ [name]; 1 @ [place]"  . carotid doppler  10/07   no stenosis  . CT of abd and pelvis  10/07   negative  . DILATION AND CURETTAGE OF UTERUS    . EXCISIONAL HEMORRHOIDECTOMY  1990's  . exercise stress test  2/07  . gross hematuria on anticoagulants  10/07   neg urol work up   . MITRAL VALVE ANNULOPLASTY  10/23/1998   "Model 4625; Seriel [id number]"; size 81mm; [facility]; Dr. Boyce Medici  . PILONIDAL CYST EXCISION  1954  . TUBAL  LIGATION  1978   Family History  Problem Relation Age of Onset  . Hypertension Mother   . Lung cancer Father     smoker  . Alcohol abuse Father   . Cancer Father     bladder and lung CA smoker  . Breast cancer Neg Hx    Social History  Substance Use Topics  . Smoking status: Never Smoker  . Smokeless tobacco: Never Used  . Alcohol use 0.0 oz/week     Comment: 04/26/2014 "occasional glass of wine; a few times/yr"      Review of Systems Per HPI    Objective:   Physical Exam  Constitutional: She is oriented to person, place, and time. She appears well-developed and well-nourished. No distress.  HENT:  Head: Normocephalic and atraumatic.  Right Ear: Tympanic membrane, external ear and ear canal normal.  Left Ear: Tympanic membrane, external ear and ear canal normal.  Nose: Mucosal edema and rhinorrhea present.  Post nasal drainage.   Eyes: Conjunctivae are normal.  Neck: Normal range of motion. Neck supple.  Cardiovascular: Normal rate and normal heart sounds.  An irregular rhythm present.  Pulmonary/Chest: Effort  normal and breath sounds normal.  Lymphadenopathy:    She has no cervical adenopathy.  Neurological: She is alert and oriented to person, place, and time.  Skin: Skin is warm and dry. She is not diaphoretic.  Psychiatric: She has a normal mood and affect. Her behavior is normal. Judgment and thought content normal.  Vitals reviewed.     BP 110/68 (BP Location: Left Arm, Patient Position: Sitting, Cuff Size: Normal)   Pulse 88   Temp 97.8 F (36.6 C) (Oral)   Wt 169 lb (76.7 kg)   SpO2 97%   BMI 27.70 kg/m  Wt Readings from Last 3 Encounters:  06/17/16 169 lb (76.7 kg)  06/05/16 169 lb (76.7 kg)  03/11/16 161 lb (73 kg)       Assessment & Plan:  1. Upper respiratory tract infection, unspecified type - likely viral, discussed with patient, if not better in 3-4 days, can start antibiotic - if antibiotic is required, decrease coumadin to 1/2 dose and  recheck INR  - azithromycin (ZITHROMAX) 250 MG tablet; Take 2 tabs PO x 1 dose, then 1 tab PO QD x 4 days  Dispense: 6 tablet; Refill: 0 - RTC precautions reviewed  Clarene Reamer, FNP-BC  [facility]  06/18/2016 9:02 PM

## 2016-06-23 ENCOUNTER — Other Ambulatory Visit: Payer: Self-pay

## 2016-06-23 ENCOUNTER — Ambulatory Visit (INDEPENDENT_AMBULATORY_CARE_PROVIDER_SITE_OTHER): Payer: Medicare Other

## 2016-06-23 DIAGNOSIS — I4821 Permanent atrial fibrillation: Secondary | ICD-10-CM

## 2016-06-23 DIAGNOSIS — I482 Chronic atrial fibrillation: Secondary | ICD-10-CM

## 2016-06-23 DIAGNOSIS — Z5181 Encounter for therapeutic drug level monitoring: Secondary | ICD-10-CM

## 2016-06-23 LAB — POCT INR: INR: 1.7

## 2016-06-23 MED ORDER — ALBUTEROL SULFATE HFA 108 (90 BASE) MCG/ACT IN AERS: 2.0000 | INHALATION_SPRAY | RESPIRATORY_TRACT | 0 refills | Status: DC | PRN

## 2016-06-23 NOTE — Patient Instructions (Signed)
Pre visit review using our clinic review tool, if applicable. No additional management support is needed unless otherwise documented below in the visit note. 

## 2016-06-23 NOTE — Telephone Encounter (Signed)
While in for coumadin check this am, patient reports that she is going out of the country on a trip this weekend and needs albuterol inhaler refilled to take with her.   Patient keeps regular office visits with PCP for general health.  Last seen by PCP on 03/11/16.  Was acutely seen by NP on 06/17/16 for URI.    Will refill albuterol inhaler X 1.

## 2016-07-10 ENCOUNTER — Ambulatory Visit (INDEPENDENT_AMBULATORY_CARE_PROVIDER_SITE_OTHER): Payer: Medicare Other

## 2016-07-10 DIAGNOSIS — Z5181 Encounter for therapeutic drug level monitoring: Secondary | ICD-10-CM | POA: Diagnosis not present

## 2016-07-10 DIAGNOSIS — I4821 Permanent atrial fibrillation: Secondary | ICD-10-CM

## 2016-07-10 DIAGNOSIS — I482 Chronic atrial fibrillation: Secondary | ICD-10-CM

## 2016-07-10 LAB — POCT INR: INR: 1.7

## 2016-07-10 NOTE — Patient Instructions (Signed)
Pre visit review using our clinic review tool, if applicable. No additional management support is needed unless otherwise documented below in the visit note. 

## 2016-07-14 DIAGNOSIS — L578 Other skin changes due to chronic exposure to nonionizing radiation: Secondary | ICD-10-CM | POA: Diagnosis not present

## 2016-07-14 DIAGNOSIS — L820 Inflamed seborrheic keratosis: Secondary | ICD-10-CM | POA: Diagnosis not present

## 2016-07-14 DIAGNOSIS — Z85828 Personal history of other malignant neoplasm of skin: Secondary | ICD-10-CM | POA: Diagnosis not present

## 2016-07-21 ENCOUNTER — Ambulatory Visit (INDEPENDENT_AMBULATORY_CARE_PROVIDER_SITE_OTHER): Payer: Medicare Other

## 2016-07-21 DIAGNOSIS — Z5181 Encounter for therapeutic drug level monitoring: Secondary | ICD-10-CM

## 2016-07-21 DIAGNOSIS — I482 Chronic atrial fibrillation: Secondary | ICD-10-CM

## 2016-07-21 DIAGNOSIS — I4821 Permanent atrial fibrillation: Secondary | ICD-10-CM

## 2016-07-21 LAB — POCT INR: INR: 2.3

## 2016-07-21 NOTE — Patient Instructions (Signed)
Pre visit review using our clinic review tool, if applicable. No additional management support is needed unless otherwise documented below in the visit note. 

## 2016-07-27 ENCOUNTER — Telehealth: Payer: Self-pay | Admitting: Family Medicine

## 2016-07-27 NOTE — Telephone Encounter (Signed)
Pt will call back to schedule AWV °

## 2016-08-17 ENCOUNTER — Other Ambulatory Visit: Payer: Self-pay | Admitting: Family Medicine

## 2016-08-17 ENCOUNTER — Other Ambulatory Visit: Payer: Self-pay | Admitting: Internal Medicine

## 2016-08-18 ENCOUNTER — Ambulatory Visit (INDEPENDENT_AMBULATORY_CARE_PROVIDER_SITE_OTHER): Payer: Medicare Other

## 2016-08-18 DIAGNOSIS — Z5181 Encounter for therapeutic drug level monitoring: Secondary | ICD-10-CM | POA: Diagnosis not present

## 2016-08-18 DIAGNOSIS — I482 Chronic atrial fibrillation: Secondary | ICD-10-CM

## 2016-08-18 DIAGNOSIS — I4821 Permanent atrial fibrillation: Secondary | ICD-10-CM

## 2016-08-18 LAB — POCT INR: INR: 1.8

## 2016-08-18 NOTE — Patient Instructions (Signed)
Pre visit review using our clinic review tool, if applicable. No additional management support is needed unless otherwise documented below in the visit note. 

## 2016-08-26 ENCOUNTER — Ambulatory Visit (INDEPENDENT_AMBULATORY_CARE_PROVIDER_SITE_OTHER): Payer: Medicare Other | Admitting: Family Medicine

## 2016-08-26 ENCOUNTER — Ambulatory Visit (INDEPENDENT_AMBULATORY_CARE_PROVIDER_SITE_OTHER): Payer: Medicare Other

## 2016-08-26 VITALS — BP 100/70 | HR 75 | Temp 98.1°F | Ht 63.75 in | Wt 165.5 lb

## 2016-08-26 DIAGNOSIS — I8393 Asymptomatic varicose veins of bilateral lower extremities: Secondary | ICD-10-CM

## 2016-08-26 DIAGNOSIS — Z1211 Encounter for screening for malignant neoplasm of colon: Secondary | ICD-10-CM | POA: Diagnosis not present

## 2016-08-26 DIAGNOSIS — Z23 Encounter for immunization: Secondary | ICD-10-CM | POA: Diagnosis not present

## 2016-08-26 DIAGNOSIS — R600 Localized edema: Secondary | ICD-10-CM

## 2016-08-26 DIAGNOSIS — Z0000 Encounter for general adult medical examination without abnormal findings: Secondary | ICD-10-CM

## 2016-08-26 DIAGNOSIS — E039 Hypothyroidism, unspecified: Secondary | ICD-10-CM | POA: Diagnosis not present

## 2016-08-26 DIAGNOSIS — E2839 Other primary ovarian failure: Secondary | ICD-10-CM | POA: Diagnosis not present

## 2016-08-26 DIAGNOSIS — I4821 Permanent atrial fibrillation: Secondary | ICD-10-CM

## 2016-08-26 DIAGNOSIS — K649 Unspecified hemorrhoids: Secondary | ICD-10-CM | POA: Insufficient documentation

## 2016-08-26 DIAGNOSIS — E7800 Pure hypercholesterolemia, unspecified: Secondary | ICD-10-CM

## 2016-08-26 DIAGNOSIS — I5030 Unspecified diastolic (congestive) heart failure: Secondary | ICD-10-CM

## 2016-08-26 DIAGNOSIS — I482 Chronic atrial fibrillation: Secondary | ICD-10-CM | POA: Diagnosis not present

## 2016-08-26 DIAGNOSIS — Z1231 Encounter for screening mammogram for malignant neoplasm of breast: Secondary | ICD-10-CM | POA: Insufficient documentation

## 2016-08-26 NOTE — Progress Notes (Signed)
PCP notes:   Health maintenance:  PCV13 - PCP please address with pt at CPE Bone density - PCP please address with pt at CPE  Abnormal screenings:   Hearing - failed Mini-Cog score: 19/20  Patient concerns:   Pt is concerned about recent weight gain, tingling in lower extremities, left knee pain, left hip pain, and bleed hemorrhoids. PCP notified.  Nurse concerns:  None  Next PCP appt:   08/26/16 @ 1530  I reviewed health advisor's note, was. available for consultation, and agree with documentation and plan.  [name] MD

## 2016-08-26 NOTE — Progress Notes (Signed)
Pre visit review using our clinic review tool, if applicable. No additional management support is needed unless otherwise documented below in the visit note. 

## 2016-08-26 NOTE — Patient Instructions (Addendum)
Get back to water aerobics   Labs today   prevnar vaccine today  See the handout regarding tetanus vaccine   Stop Biotin-it can affect your thyroid function   We will schedule dexa and mammogram at check out  We will also refer you to GI for hemorrhoids  Also vein clinic

## 2016-08-26 NOTE — Progress Notes (Signed)
Subjective:   [name] is a 83 y.o. female who presents for an Initial Medicare Annual Wellness Visit.  Review of Systems    N/A  Cardiac Risk Factors include: advanced age (>73men, >6 women);dyslipidemia     Objective:    Today's Vitals   08/26/16 1448  BP: 100/70  Pulse: 75  Temp: 98.1 F (36.7 C)  TempSrc: Oral  SpO2: 95%  Weight: 165 lb 8 oz (75.1 kg)  Height: 5' 3.75" (1.619 m)  PainSc: 0-No pain   Body mass index is 28.63 kg/m.   Current Medications (verified) Outpatient Encounter Prescriptions as of 08/26/2016  Medication Sig  . albuterol (PROVENTIL HFA;VENTOLIN HFA) 108 (90 Base) MCG/ACT inhaler Inhale 2 puffs into the lungs every 4 (four) hours as needed for wheezing or shortness of breath.  . benzonatate (TESSALON) 200 MG capsule Take 1 capsule (200 mg total) by mouth 3 (three) times daily as needed for cough. Swallow whole, do not bite pill  . BIOTIN 5000 PO Take by mouth.  . CARTIA XT 180 MG 24 hr capsule TAKE 1 CAPSULE (180 MG TOTAL) BY MOUTH DAILY.  . cholecalciferol (VITAMIN D) 1000 units tablet Take 1,000 Units by mouth daily.  . cyanocobalamin 1000 MCG tablet Take 1,000 mcg by mouth daily.  . fluorouracil (EFUDEX) 5 % cream Apply 1 application topically 2 (two) times daily.  . furosemide (LASIX) 40 MG tablet TAKE 1/2 TABLET BY MOUTH DAILY, MAY INCREASE TO 1 TABLET BY MOUTH DAILY IF NEEDED  . levothyroxine (SYNTHROID, LEVOTHROID) 25 MCG tablet TAKE 1 TABLET EVERY DAY BEFORE BREAKFAST  . loratadine (CLARITIN) 10 MG tablet Take 10 mg by mouth daily as needed for rhinitis.   [name] warfarin (COUMADIN) 5 MG tablet TAKE AS DIRECTED BY ANTI COAGULATION CLINIC  . [DISCONTINUED] azithromycin (ZITHROMAX) 250 MG tablet Take 2 tabs PO x 1 dose, then 1 tab PO QD x 4 days   No facility-administered encounter medications on file as of 08/26/2016.     Allergies (verified) Amiodarone hcl; Statins; and Penicillins   History: Past Medical History:  Diagnosis Date  .  Allergic rhinitis   . 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 Asthma    "brought out by smoke, air quality"  . Atrial fibrillation -persistent    a. s/p PVI [name] 2010;  b. on tikosyn/coumadin;  c. 05/2009 Echo: EF 60-65%, Gr 2 DD.  . Cardiomyopathy    rate-related-resolved  . Carotid stenosis    mild (hosp 3/11)- consult by vasc/ Dr Donnetta Hutching  . Complication of anesthesia    "I have trouble waking up; I'm very sensitive to anesthesia"  . Depression   . Diverticulosis   . Fatty liver   . FH: colonic polyps   . Heart murmur    "prior to valve repair"  . Hyperlipidemia   . Pneumonia 1951   "double"  . Pulmonary nodule   . Syncope and collapse   . TIA (transient ischemic attack) 2010   "left facial numbness after I drank a thermos of iced coffee; not sure it was a TIA"  . Tubular adenoma    Past Surgical History:  Procedure Laterality Date  . admit- syncope, bradycardia     cardiac and neuro work up  . APPENDECTOMY  1978  . AV NODE ABLATION     "2 @ [name]; 1 @ [place]"  . carotid doppler  10/07   no stenosis  . CT of abd and pelvis  10/07   negative  .  DILATION AND CURETTAGE OF UTERUS    . EXCISIONAL HEMORRHOIDECTOMY  1990's  . exercise stress test  2/07  . gross hematuria on anticoagulants  10/07   neg urol work up   . MITRAL VALVE ANNULOPLASTY  10/23/1998   "Model 4625; Seriel [id number]"; size 59mm; [facility]; Dr. Boyce Medici  . PILONIDAL CYST EXCISION  1954  . TUBAL LIGATION  1978   Family History  Problem Relation Age of Onset  . Hypertension Mother   . Lung cancer Father        smoker  . Alcohol abuse Father   . Cancer Father        bladder and lung CA smoker  . Breast cancer Neg Hx    Social History   Occupational History  . realtor Retired   Social History Main Topics  . Smoking status: Never Smoker  . Smokeless tobacco: Never Used  . Alcohol use 0.0 oz/week     Comment: 04/26/2014 "occasional glass of wine; a few times/yr"  . Drug use: No  . Sexual activity:  No    Tobacco Counseling Counseling given: No   Activities of Daily Living In your present state of health, do you have any difficulty performing the following activities: 08/26/2016  Hearing? Y  Vision? Y  Difficulty concentrating or making decisions? N  Walking or climbing stairs? N  Dressing or bathing? N  Doing errands, shopping? N  Preparing Food and eating ? N  Using the Toilet? N  In the past six months, have you accidently leaked urine? Y  Do you have problems with loss of bowel control? N  Managing your Medications? N  Managing your Finances? N  Housekeeping or managing your Housekeeping? N  Some recent data might be hidden    Immunizations and Health Maintenance Immunization History  Administered Date(s) Administered  . Td 02/03/2005   There are no preventive care reminders to display for this patient.  Patient Care Team: Tower, Wynelle Fanny, MD as PCP - General    Assessment:   This is a routine wellness examination for [name].  Hearing/Vision screen  Hearing Screening   125Hz  250Hz  500Hz  1000Hz  2000Hz  3000Hz  4000Hz  6000Hz  8000Hz   Right ear:   40 40 40  0    Left ear:   0 0 0  0    Vision Screening Comments: Vision exam with Dr. Maryruth Hancock B. Approx. 1 yr ago  Dietary issues and exercise activities discussed: Current Exercise Habits: The patient does not participate in regular exercise at present, Exercise limited by: None identified  Goals    . Weight (lb) < 160 lb (72.6 kg)          When schedule permits, I will resume water aerobics for 60 min daily.       Depression Screen PHQ 2/9 Scores 08/26/2016 05/15/2015  PHQ - 2 Score 0 0    Fall Risk Fall Risk  08/26/2016 05/15/2015  Falls in the past year? No No    Cognitive Function: MMSE - Mini Mental State Exam 08/26/2016  Orientation to time 5  Orientation to Place 5  Registration 3  Attention/ Calculation 0  Recall 2  Recall-comments pt was unable to recall 1 of 3 words  Language- name 2 objects 0    Language- repeat 1  Language- follow 3 step command 3  Language- read & follow direction 0  Write a sentence 0  Copy design 0  Total score 19     PLEASE NOTE: A Mini-Cog  screen was completed. Maximum score is 20. A value of 0 denotes this part of [place] MMSE was not completed or the patient failed this part of the Mini-Cog screening.   Mini-Cog Screening Orientation to Time - Max 5 pts Orientation to Place - Max 5 pts Registration - Max 3 pts Recall - Max 3 pts Language Repeat - Max 1 pts Language Follow 3 Step Command - Max 3 pts     Screening Tests Health Maintenance  Topic Date Due  . PNA vac Low Risk Adult (1 of 2 - PCV13) 08/26/2017 (Originally 10/28/1999)  . TETANUS/TDAP  02/02/2025 (Originally 02/04/2015)  . INFLUENZA VACCINE  10/21/2016  . DEXA SCAN  Completed      Plan:     I have personally reviewed and addressed the Medicare Annual Wellness questionnaire and have noted the following in the patient's chart:  A. Medical and social history B. Use of alcohol, tobacco or illicit drugs  C. Current medications and supplements D. Functional ability and status E.  Nutritional status F.  Physical activity G. Advance directives H. List of other physicians I.  Hospitalizations, surgeries, and ER visits in previous 12 months J.  [place] to include hearing, vision, cognitive, depression L. Referrals and appointments - none  In addition, I have reviewed and discussed with patient certain preventive protocols, quality metrics, and best practice recommendations. A written personalized care plan for preventive services as well as general preventive health recommendations were provided to patient.  See attached scanned questionnaire for additional information.   Signed,   Lindell Noe, MHA, BS, LPN Health Coach

## 2016-08-26 NOTE — Patient Instructions (Signed)
[name] , Thank you for taking time to come for your Medicare Wellness Visit. I appreciate your ongoing commitment to your health goals. Please review the following plan we discussed and let me know if I can assist you in the future.   These are the goals we discussed: Goals    . Weight (lb) < 160 lb (72.6 kg)          When schedule permits, I will resume water aerobics for 60 min daily.        This is a list of the screening recommended for you and due dates:  Health Maintenance  Topic Date Due  . Pneumonia vaccines (1 of 2 - PCV13) 08/26/2017*  . Tetanus Vaccine  02/02/2025*  . Flu Shot  10/21/2016  . DEXA scan (bone density measurement)  Completed  *Topic was postponed. The date shown is not the original due date.   Preventive Care for Adults  A healthy lifestyle and preventive care can promote health and wellness. Preventive health guidelines for adults include the following key practices.  . A routine yearly physical is a good way to check with your health care provider about your health and preventive screening. It is a chance to share any concerns and updates on your health and to receive a thorough exam.  . Visit your dentist for a routine exam and preventive care every 6 months. Brush your teeth twice a day and floss once a day. Good oral hygiene prevents tooth decay and gum disease.  . The frequency of eye exams is based on your age, health, family medical history, use  of contact lenses, and other factors. Follow your health care provider's ecommendations for frequency of eye exams.  . Eat a healthy diet. Foods like vegetables, fruits, whole grains, low-fat dairy products, and lean protein foods contain the nutrients you need without too many calories. Decrease your intake of foods high in solid fats, added sugars, and salt. Eat the right amount of calories for you. Get information about a proper diet from your health care provider, if necessary.  . Regular physical  exercise is one of the most important things you can do for your health. Most adults should get at least 150 minutes of moderate-intensity exercise (any activity that increases your heart rate and causes you to sweat) each week. In addition, most adults need muscle-strengthening exercises on 2 or more days a week.  Silver Sneakers may be a benefit available to you. To determine eligibility, you may visit the website: www.silversneakers.com or contact program at 224-019-1698 Mon-Fri between 8AM-8PM.   . Maintain a healthy weight. The body mass index (BMI) is a screening tool to identify possible weight problems. It provides an estimate of body fat based on height and weight. Your health care provider can find your BMI and can help you achieve or maintain a healthy weight.   For adults 20 years and older: ? A BMI below 18.5 is considered underweight. ? A BMI of 18.5 to 24.9 is normal. ? A BMI of 25 to 29.9 is considered overweight. ? A BMI of 30 and above is considered obese.   . Maintain normal blood lipids and cholesterol levels by exercising and minimizing your intake of saturated fat. Eat a balanced diet with plenty of fruit and vegetables. Blood tests for lipids and cholesterol should begin at age 37 and be repeated every 5 years. If your lipid or cholesterol levels are high, you are over 50, or you are at  high risk for heart disease, you may need your cholesterol levels checked more frequently. Ongoing high lipid and cholesterol levels should be treated with medicines if diet and exercise are not working.  . If you smoke, find out from your health care provider how to quit. If you do not use tobacco, please do not start.  . If you choose to drink alcohol, please do not consume more than 2 drinks per day. One drink is considered to be 12 ounces (355 mL) of beer, 5 ounces (148 mL) of wine, or 1.5 ounces (44 mL) of liquor.  . If you are 20-58 years old, ask your health care provider if you  should take aspirin to prevent strokes.  . Use sunscreen. Apply sunscreen liberally and repeatedly throughout the day. You should seek shade when your shadow is shorter than you. Protect yourself by wearing long sleeves, pants, a wide-brimmed hat, and sunglasses year round, whenever you are outdoors.  . Once a month, do a whole body skin exam, using a mirror to look at the skin on your back. Tell your health care provider of new moles, moles that have irregular borders, moles that are larger than a pencil eraser, or moles that have changed in shape or color.

## 2016-08-26 NOTE — Progress Notes (Signed)
Subjective:    Patient ID: [name], female    DOB: [birth year], 83 y.o.   MRN: [id number]  HPI Here for annual f/u of chronic medical problems   She is concerned about swelling/tingling in legs - (this has just started) Also wt gain Wt Readings from Last 3 Encounters:  08/26/16 165 lb 8 oz (75.1 kg)  08/26/16 165 lb 8 oz (75.1 kg)  06/17/16 169 lb (76.7 kg)  she gained from 161 to 169 from dec to march  bmi is 28.6 Eats meat Avoids bread  Drinks some milk (whole milk is her dessert)  Watches her sodium   When she traveled out of the country -she was drinking mineral water (? If sodium) She had painful swelling  Tight and tingly all the time   Varicose veins - are bothering her/stinging   She takes lasix   Had cardiology visit in March -had a good check   Getting ready to start water aerobics   Had AMW with Lesia today  Missed one recall words on mini cog    (she makes sure to write things down)  She does not get lost or confused  Failed hearing - she notices / plans to get hearing aides in the future  Due for prevnar vaccine  Last tetanus shot 11/06  dexa 6/11- osteopenia  Wants to get a dexa - has shrunk 2 in  No falls or fractures  Taking vitamin D Getting exercise    Mammogram 3/17 negative -wants to keep doing mammograms  [facility] to [facility]- no lumps but has not really checked   colonscopy 3/16-adenomatous polyp No recall due to age  Hemorrhoids are very very bothersome  Wants ref to GI   Zoster status -she has had shingles in the past/not interested in vaccine right now   Atrial fibulation  Continues anticoagulation  On cartia XT  She is worried about heart failure   BP Readings from Last 3 Encounters:  08/26/16 100/70  06/17/16 110/68  06/05/16 118/78   Pulse Readings from Last 3 Encounters:  08/26/16 75  06/17/16 88  06/05/16 62     Hypothyroidism  Pt has no clinical changes No change in energy  level/ hair or skin/ and no tremor She feels down emotionally (flat and sad but not depressed) Also edema  Lab Results  Component Value Date   TSH 2.973 04/19/2015    Due for tsh   Lost her mother -tough on her / getting used to a new schedule   Hx of hyperlipidemia Due for lipid check No hx of vascular dz      Chemistry      Component Value Date/Time   NA 141 06/05/2016 1352   NA 139 06/26/2013 2220   K 4.2 06/05/2016 1352   K 3.5 06/26/2013 2220   CL 97 06/05/2016 1352   CL 106 06/26/2013 2220   CO2 30 (H) 06/05/2016 1352   CO2 27 06/26/2013 2220   BUN 27 06/05/2016 1352   BUN 14 06/26/2013 2220   CREATININE 1.02 (H) 06/05/2016 1352   CREATININE 0.80 05/15/2015 0959      Component Value Date/Time   CALCIUM 9.8 06/05/2016 1352   CALCIUM 8.3 (L) 06/26/2013 2220   ALKPHOS 46 05/15/2015 0959   ALKPHOS 48 02/28/2013 1110   AST 17 05/15/2015 0959   AST 25 02/28/2013 1110   ALT 11 05/15/2015 0959   ALT 19 02/28/2013 1110  BILITOT 0.6 05/15/2015 0959   BILITOT 0.6 02/28/2013 1110     Lab Results  Component Value Date   WBC 5.4 06/05/2016   HGB 13.6 06/05/2016   HCT 40.2 06/05/2016   MCV 83 06/05/2016   PLT 168 06/05/2016     Patient Active Problem List   Diagnosis Date Noted  . Pedal edema 08/26/2016  . Varicose veins of both lower extremities 08/26/2016  . Estrogen deficiency 08/26/2016  . Screening mammogram, encounter for 08/26/2016  . Hemorrhoids 08/26/2016  . Epistaxis 01/07/2016  . Urticaria 08/22/2014  . Hip pain 08/02/2014  . Left knee pain 08/02/2014  . Chronic cough 05/08/2014  . Blood in stool 04/09/2014  . Caregiver stress 08/16/2013  . Colon cancer screening 08/16/2013  . Encounter for therapeutic drug monitoring 04/20/2013  . Left ovarian cyst 03/14/2013  . (HFpEF) heart failure with preserved ejection fraction ([place]) 12/27/2012  . Cardiomyopathy, secondary --Resolved again 10/14 10/13/2010  . COLONIC POLYPS, ADENOMATOUS, HX OF  09/18/2009  . PULMONARY NODULE 12/20/2008  . GANGLION CYST 10/04/2007  . Hyperlipidemia 04/27/2007  . DEPRESSION 04/27/2007  . Asthma, mild intermittent 04/27/2007  . INSOMNIA 04/27/2007  . ADENOMATOUS COLONIC POLYP 11/04/2006  . Hypothyroidism 09/02/2006  . Atrial fibrillation ([place]) 08/05/2006   Past Medical History:  Diagnosis Date  . Allergic rhinitis   . 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 Asthma    "brought out by smoke, air quality"  . Atrial fibrillation -persistent    a. s/p PVI [name] 2010;  b. on tikosyn/coumadin;  c. 05/2009 Echo: EF 60-65%, Gr 2 DD.  . Cardiomyopathy    rate-related-resolved  . Carotid stenosis    mild (hosp 3/11)- consult by vasc/ Dr Donnetta Hutching  . Complication of anesthesia    "I have trouble waking up; I'm very sensitive to anesthesia"  . Depression   . Diverticulosis   . Fatty liver   . FH: colonic polyps   . Heart murmur    "prior to valve repair"  . Hyperlipidemia   . Pneumonia 1951   "double"  . Pulmonary nodule   . Syncope and collapse   . TIA (transient ischemic attack) 2010   "left facial numbness after I drank a thermos of iced coffee; not sure it was a TIA"  . Tubular adenoma    Past Surgical History:  Procedure Laterality Date  . admit- syncope, bradycardia     cardiac and neuro work up  . APPENDECTOMY  1978  . AV NODE ABLATION     "2 @ [name]; 1 @ [place]"  . carotid doppler  10/07   no stenosis  . CT of abd and pelvis  10/07   negative  . DILATION AND CURETTAGE OF UTERUS    . EXCISIONAL HEMORRHOIDECTOMY  1990's  . exercise stress test  2/07  . gross hematuria on anticoagulants  10/07   neg urol work up   . MITRAL VALVE ANNULOPLASTY  10/23/1998   "Model 4625; Seriel [id number]"; size 31mm; [facility]; Dr. Boyce Medici  . PILONIDAL CYST EXCISION  1954  . TUBAL LIGATION  1978   Social History  Substance Use Topics  . Smoking status: Never Smoker  . Smokeless tobacco: Never Used  . Alcohol use 0.0 oz/week     Comment: 04/26/2014  "occasional glass of wine; a few times/yr"   Family History  Problem Relation Age of Onset  . Hypertension Mother   . Lung cancer Father        smoker  .  Alcohol abuse Father   . Cancer Father        bladder and lung CA smoker  . Breast cancer Neg Hx    Allergies  Allergen Reactions  . Amiodarone Hcl Swelling  . Statins     REACTION: rash  . Penicillins Rash    REACTION: rash   Current Outpatient Prescriptions on File Prior to Visit  Medication Sig Dispense Refill  . albuterol (PROVENTIL HFA;VENTOLIN HFA) 108 (90 Base) MCG/ACT inhaler Inhale 2 puffs into the lungs every 4 (four) hours as needed for wheezing or shortness of breath. 1 Inhaler 0  . benzonatate (TESSALON) 200 MG capsule Take 1 capsule (200 mg total) by mouth 3 (three) times daily as needed for cough. Swallow whole, do not bite pill 30 capsule 2  . CARTIA XT 180 MG 24 hr capsule TAKE 1 CAPSULE (180 MG TOTAL) BY MOUTH DAILY. 90 capsule 2  . fluorouracil (EFUDEX) 5 % cream Apply 1 application topically 2 (two) times daily.    . furosemide (LASIX) 40 MG tablet TAKE 1/2 TABLET BY MOUTH DAILY, MAY INCREASE TO 1 TABLET BY MOUTH DAILY IF NEEDED 90 tablet 2  . levothyroxine (SYNTHROID, LEVOTHROID) 25 MCG tablet TAKE 1 TABLET EVERY DAY BEFORE BREAKFAST 90 tablet 1  . loratadine (CLARITIN) 10 MG tablet Take 10 mg by mouth daily as needed for rhinitis.     [name] warfarin (COUMADIN) 5 MG tablet TAKE AS DIRECTED BY ANTI COAGULATION CLINIC 30 tablet 0   No current facility-administered medications on file prior to visit.     Review of Systems Review of Systems  Constitutional: Negative for fever, appetite change, and unexpected weight change.  Eyes: Negative for pain and visual disturbance.  Respiratory: Negative for cough and shortness of breath.   Cardiovascular: Negative for cp or palpitations   pos for ankle swelling and tingling/tightness Gastrointestinal: Negative for nausea, diarrhea and constipation. pos for ongoing bleeding  hemorrhoids  Genitourinary: Negative for urgency and frequency.  Skin: Negative for pallor or rash   Neurological: Negative for weakness, light-headedness, numbness and headaches.  Hematological: Negative for adenopathy. Does not bruise/bleed easily.  Psychiatric/Behavioral: Negative for dysphoric mood. The patient is not nervous/anxious. Pos for a generally flat mood lately , pos for grief from loss of mother         Objective:   Physical Exam  Constitutional: She appears well-developed and well-nourished. No distress.  overwt and well app  HENT:  Head: Normocephalic and atraumatic.  Right Ear: External ear normal.  Left Ear: External ear normal.  Mouth/Throat: Oropharynx is clear and moist.  Eyes: Conjunctivae and EOM are normal. Pupils are equal, round, and reactive to light. No scleral icterus.  Neck: Normal range of motion. Neck supple. No JVD present. Carotid bruit is not present. No thyromegaly present.  Cardiovascular: Normal rate, normal heart sounds and intact distal pulses.  Exam reveals no gallop.   irreg irregular rhythm baseline  Small but numerous compressible varicosities below knee bilaterally w/o redness or ulceration or swelling  Pulmonary/Chest: Effort normal and breath sounds normal. No respiratory distress. She has no wheezes. She exhibits no tenderness.  No crackles  Abdominal: Soft. Bowel sounds are normal. She exhibits no distension, no abdominal bruit and no mass. There is no tenderness.  Genitourinary: No breast swelling, tenderness, discharge or bleeding.  Musculoskeletal: Normal range of motion. She exhibits no edema or tenderness.  Varicosities of LE below knee  No pitting edema  Lymphadenopathy:    She has no  cervical adenopathy.  Neurological: She is alert. She has normal reflexes. No cranial nerve deficit. She exhibits normal muscle tone. Coordination normal.  Skin: Skin is warm and dry. No rash noted. No erythema. No pallor.  Solar lentigines  diffusely   Psychiatric: Her mood appears not anxious. Her affect is blunt. Cognition and memory are normal.  Mildly blunted affect Pt has good insight and voices her stressors and grief candidly          Assessment & Plan:   Problem List Items Addressed This Visit      Cardiovascular and Mediastinum   (HFpEF) heart failure with preserved ejection fraction (HCC)    Pt is bothered by LE edema and tightness  Suspect this is moreso from her varicose veins- nl exam today and no sob or other symptoms Will add BNP to labs       Atrial fibrillation (HCC) - Primary    Rate controlled and on anticoagulation  Rev last cardiology note       Hemorrhoids    Bothersome and bleeding (on anticoagulation) Ref to GI for eval and tx  Disc avoidance of straining when possible      Relevant Orders   Ambulatory referral to Gastroenterology   Varicose veins of both lower extremities    Suspect this may cause her tightness/swelling tingling Small veins-compressible  with no signs of infx or ulceration Recommend supp hose Ref to vein clinic at her req      Relevant Orders   Ambulatory referral to Vascular Surgery     Endocrine   Hypothyroidism    Lab today  Pt has felt flat (mood wise)  Some LE edema TSH today      Relevant Orders   TSH (Completed)     Other   Colon cancer screening    Colonoscopy 3/16 with adenoma  No recall due to age Pt req ref to GI for hemorrhoids Would consider cologuard in the future      Estrogen deficiency    Ref for dexa No falls or fx       Relevant Orders   DG Bone Density   Hyperlipidemia    Due for lipid check  Diet controlled  Cardiology did not want to be more aggressive with last numbers       Relevant Orders   Comprehensive metabolic panel (Completed)   Lipid panel (Completed)   Pedal edema    Suspect more from varicosities than anything else Also traveled recently  Disc elevation and avoidance of sodium  Adv good water  intake  BNP added to labs (chem and other lab today) supp hose would help  Ref to vein clinic made        Relevant Orders   CBC with Differential/Platelet (Completed)   Comprehensive metabolic panel (Completed)   Brain natriuretic peptide (Completed)   Screening mammogram, encounter for    Scheduled annual screening mammogram Nl breast exam today  Encouraged monthly self exams          Relevant Orders   MM DIGITAL SCREENING BILATERAL    Other Visit Diagnoses    Need for vaccination with 13-polyvalent pneumococcal conjugate vaccine       Relevant Orders   Pneumococcal conjugate vaccine 13-valent (Completed)

## 2016-08-27 LAB — CBC WITH DIFFERENTIAL/PLATELET
Basophils Absolute: 0.1 10*3/uL (ref 0.0–0.1)
Basophils Relative: 1.1 % (ref 0.0–3.0)
EOS PCT: 3 % (ref 0.0–5.0)
Eosinophils Absolute: 0.2 10*3/uL (ref 0.0–0.7)
HCT: 41.5 % (ref 36.0–46.0)
Hemoglobin: 13.9 g/dL (ref 12.0–15.0)
Lymphocytes Relative: 31.7 % (ref 12.0–46.0)
Lymphs Abs: 1.9 10*3/uL (ref 0.7–4.0)
MCHC: 33.5 g/dL (ref 30.0–36.0)
MCV: 84.2 fl (ref 78.0–100.0)
MONO ABS: 0.4 10*3/uL (ref 0.1–1.0)
MONOS PCT: 7.4 % (ref 3.0–12.0)
NEUTROS ABS: 3.5 10*3/uL (ref 1.4–7.7)
NEUTROS PCT: 56.8 % (ref 43.0–77.0)
Platelets: 168 10*3/uL (ref 150.0–400.0)
RBC: 4.93 Mil/uL (ref 3.87–5.11)
RDW: 15 % (ref 11.5–15.5)
WBC: 6.1 10*3/uL (ref 4.0–10.5)

## 2016-08-27 LAB — COMPREHENSIVE METABOLIC PANEL
ALT: 14 U/L (ref 0–35)
AST: 19 U/L (ref 0–37)
Albumin: 4.7 g/dL (ref 3.5–5.2)
Alkaline Phosphatase: 39 U/L (ref 39–117)
BUN: 25 mg/dL — AB (ref 6–23)
CHLORIDE: 102 meq/L (ref 96–112)
CO2: 31 meq/L (ref 19–32)
Calcium: 9.8 mg/dL (ref 8.4–10.5)
Creatinine, Ser: 0.93 mg/dL (ref 0.40–1.20)
GFR: 61.37 mL/min (ref >60.00)
GLUCOSE: 82 mg/dL (ref 70–99)
POTASSIUM: 3.6 meq/L (ref 3.5–5.1)
SODIUM: 141 meq/L (ref 135–145)
TOTAL PROTEIN: 7.8 g/dL (ref 6.0–8.3)
Total Bilirubin: 0.8 mg/dL (ref 0.2–1.2)

## 2016-08-27 LAB — LIPID PANEL
CHOL/HDL RATIO: 5
Cholesterol: 246 mg/dL — ABNORMAL HIGH (ref 0–200)
HDL: 49.7 mg/dL (ref >39.00)
LDL CALC: 178 mg/dL — AB (ref 0–99)
NONHDL: 196.64
Triglycerides: 95 mg/dL (ref 0.0–149.0)
VLDL: 19 mg/dL (ref 0.0–40.0)

## 2016-08-27 LAB — BRAIN NATRIURETIC PEPTIDE: Pro B Natriuretic peptide (BNP): 98 pg/mL (ref 0.0–100.0)

## 2016-08-27 LAB — TSH: TSH: 3.06 u[IU]/mL (ref 0.35–4.50)

## 2016-08-27 NOTE — Assessment & Plan Note (Signed)
Ref for dexa No falls or fx

## 2016-08-27 NOTE — Assessment & Plan Note (Addendum)
Bothersome and bleeding (on anticoagulation) Ref to GI for eval and tx  Disc avoidance of straining when possible

## 2016-08-27 NOTE — Assessment & Plan Note (Signed)
Lab today  Pt has felt flat (mood wise)  Some LE edema TSH today

## 2016-08-27 NOTE — Assessment & Plan Note (Signed)
Scheduled annual screening mammogram Nl breast exam today  Encouraged monthly self exams   

## 2016-08-27 NOTE — Assessment & Plan Note (Signed)
Suspect more from varicosities than anything else Also traveled recently  Disc elevation and avoidance of sodium  Adv good water intake  BNP added to labs (chem and other lab today) supp hose would help  Ref to vein clinic made

## 2016-08-27 NOTE — Assessment & Plan Note (Signed)
Due for lipid check  Diet controlled  Cardiology did not want to be more aggressive with last numbers

## 2016-08-27 NOTE — Assessment & Plan Note (Signed)
Suspect this may cause her tightness/swelling tingling Small veins-compressible  with no signs of infx or ulceration Recommend supp hose Ref to vein clinic at her req

## 2016-08-27 NOTE — Assessment & Plan Note (Signed)
Colonoscopy 3/16 with adenoma  No recall due to age Pt req ref to GI for hemorrhoids Would consider cologuard in the future

## 2016-08-27 NOTE — Assessment & Plan Note (Signed)
Rate controlled and on anticoagulation  Rev last cardiology note

## 2016-08-27 NOTE — Assessment & Plan Note (Signed)
Pt is bothered by LE edema and tightness  Suspect this is moreso from her varicose veins- nl exam today and no sob or other symptoms Will add BNP to labs

## 2016-08-31 ENCOUNTER — Telehealth: Payer: Self-pay
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
## 2016-08-31 NOTE — Telephone Encounter (Signed)
Left detailed message on voicemail to return call with info. 

## 2016-08-31 NOTE — Telephone Encounter (Signed)
Unclear from message- does the patient have a blister on her lip or was it the person she was exposed to?   Thanks
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
## 2016-08-31 NOTE — Telephone Encounter (Signed)
I agree-I think that is ok , thanks

## 2016-09-15 ENCOUNTER — Ambulatory Visit (INDEPENDENT_AMBULATORY_CARE_PROVIDER_SITE_OTHER): Payer: Medicare Other

## 2016-09-15 ENCOUNTER — Ambulatory Visit (INDEPENDENT_AMBULATORY_CARE_PROVIDER_SITE_OTHER): Payer: Medicare Other | Admitting: Gastroenterology

## 2016-09-15 ENCOUNTER — Ambulatory Visit: Payer: Medicare Other

## 2016-09-15 ENCOUNTER — Telehealth: Payer: Self-pay

## 2016-09-15 ENCOUNTER — Other Ambulatory Visit (INDEPENDENT_AMBULATORY_CARE_PROVIDER_SITE_OTHER): Payer: Medicare Other

## 2016-09-15 ENCOUNTER — Encounter: Payer: Self-pay | Admitting: Gastroenterology

## 2016-09-15 VITALS — BP 102/66 | HR 80 | Ht 63.75 in | Wt 169.0 lb

## 2016-09-15 DIAGNOSIS — I482 Chronic atrial fibrillation: Secondary | ICD-10-CM

## 2016-09-15 DIAGNOSIS — K625 Hemorrhage of anus and rectum: Secondary | ICD-10-CM | POA: Diagnosis not present

## 2016-09-15 DIAGNOSIS — Z5181 Encounter for therapeutic drug level monitoring: Secondary | ICD-10-CM

## 2016-09-15 DIAGNOSIS — I481 Persistent atrial fibrillation: Secondary | ICD-10-CM | POA: Diagnosis not present

## 2016-09-15 DIAGNOSIS — R159 Full incontinence of feces: Secondary | ICD-10-CM | POA: Diagnosis not present

## 2016-09-15 DIAGNOSIS — I4819 Other persistent atrial fibrillation: Secondary | ICD-10-CM

## 2016-09-15 DIAGNOSIS — I4821 Permanent atrial fibrillation: Secondary | ICD-10-CM

## 2016-09-15 DIAGNOSIS — K648 Other hemorrhoids: Secondary | ICD-10-CM | POA: Diagnosis not present

## 2016-09-15 LAB — POCT INR: INR: 2

## 2016-09-15 NOTE — Patient Instructions (Signed)
Pre visit review using our clinic review tool, if applicable. No additional management support is needed unless otherwise documented below in the visit note. 

## 2016-09-15 NOTE — Patient Instructions (Signed)
If you are age 81 or older, your body mass index should be between 23-30. Your Body mass index is 29.24 kg/m. If this is out of the aforementioned range listed, please consider follow up with your Primary Care Provider.  If you are age 68 or younger, your body mass index should be between 19-25. Your Body mass index is 29.24 kg/m. If this is out of the aformentioned range listed, please consider follow up with your Primary Care Provider.   We will send your records to [facility], Dr. Marcello Moores.  They will contact you to schedule an appointment.  Please call us if you have not heard from them within 2 weeks.  Thank you for choosing [facility].  Dr. Wilfrid Lund

## 2016-09-15 NOTE — Progress Notes (Signed)
     [facility] Progress Note  Chief Complaint: Internal hemorrhoids with bleeding and fecal incontinence  Subjective  History:  [name] is back to see me for the first time since last September. She has long-standing symptomatic internal hemorrhoids that cause frequent bleeding, fecal incontinence and burning discomfort. She is on long-standing Coumadin for her atrial fibrillation. When I saw her last year, her cardiologist would allow her to be off Coumadin 3 days without bridging Lovenox, but required that she resume her Coumadin the evening of any procedure. I felt that banding was too high risk of bleeding, since for 5 days after the procedure is done the bleeding risk is greatest once the banded tissue falls off, leaving an ulcer. She is back today because the symptoms have been ongoing and causing significant distress and impact on her lifestyle. She denies abdominal pain or constipation. ROS: Cardiovascular:  no chest pain Respiratory: no dyspnea  The patient's Past Medical, Family and Social History were reviewed and are on file in the EMR.  Objective:  Med list reviewed  Vital signs in last 24 hrs: Vitals:   09/15/16 0928  BP: 102/66  Pulse: 80    Physical Exam   Neck: supple, no thyromegaly, JVD or lymphadenopathy  Cardiac: RRR without murmurs, S1S2 heard, no peripheral edema  Pulm: clear to auscultation bilaterally, normal RR and effort noted  Abdomen: soft, No tenderness, with active bowel sounds. No guarding or palpable hepatosplenomegaly.  Skin; multiple small ecchymoses on the arms which she attributes to minor trauma.  Rectal: As before, one grade 3 hemorrhoid is prolapsing and is easily reduced    @ASSESSMENTPLANBEGIN @ Assessment: Encounter Diagnoses  Name Primary?  . Internal bleeding hemorrhoids Yes  . Rectal bleeding   . Incontinence of feces, unspecified fecal incontinence type    Symptomatic grade 3 internal hemorrhoids.   Plan:  I  have referred her to Dr. Marcello Moores of colorectal surgery to see if she would consider banding with the above constraints on oral anticoagulation. If not, or if unsuccessful, surgical therapy would be the only remaining option. I explained all this to [facility] in detail, and she is agreeable to the referral.  Total time 15 minutes, over half spent in counseling and coordination of care.   [name]

## 2016-09-15 NOTE — Telephone Encounter (Signed)
Records have been faxed to [facility] for NP appointment for internal hemorrhoids and pt on blood thinner coumadin. Will await appointment info

## 2016-09-17 NOTE — Telephone Encounter (Signed)
Appointment scheduled with Dr Marcello Moores at [place] on 10-19-2016 @ 130pm with a 1pm arrival time.

## 2016-09-28 ENCOUNTER — Telehealth: Payer: Self-pay | Admitting: Internal Medicine

## 2016-09-28 NOTE — Telephone Encounter (Signed)
Follow up ° ° °Pt calling back °

## 2016-09-28 NOTE — Telephone Encounter (Signed)
Spoke with pt who is reporting "extreme swelling" while traveling.  She reports it is some better now but she is having "some tingling in the fronts of her legs" and if feels like the skin is stretching.  She states swelling goes all the way up both her legs, eyes are puffy and her rings are tight.  Reports she weighs daily and her wt is up about 5 lbs in 1 MONTHS time.  She doubled her Furosemide to 40 mg a day on 7/3. She denies any decrease in edema but an increase in urine output.  Last lab was 6/6 with BUN 25 Crea 0.93 and K+ 3.6.  She has not been on any K+ supplementation.  She refuses to be seen by a PA/NP and only wants Dr Olin Pia recommendations.  Advised I will forward this information to him and his nurse and she will be call with any new orders.

## 2016-09-28 NOTE — Telephone Encounter (Signed)
New message    Pt c/o swelling: STAT is pt has developed SOB within 24 hours  1. How long have you been experiencing swelling? Since April 10th  2. Where is the swelling located? Both legs  3.  Are you currently taking a "fluid pill"? Yes, pt states that she started taking a whole pill instead of a half  4.  Are you currently SOB? No   5.  Have you traveled recently? Pt states she just got back from [place] states that she is having tingling in her legs. She said she has also gained 5-6 lbs in the last month.

## 2016-09-28 NOTE — Telephone Encounter (Signed)
Lm to cb to discuss edema.

## 2016-09-29 NOTE — Telephone Encounter (Signed)
Have rher followup with dr Glori Bickers whom she saw recently

## 2016-09-29 NOTE — Telephone Encounter (Signed)
I spoke with the patient and advised her of Dr. Olin Pia recommendations.  She states she has an appointment with him for 11/20/16 to follow up and she will wait to see him then. I advised if she feels like she is having to flucuate her fluid pill dose frequently, then she she call Dr. Glori Bickers to follow up as her electrolytes may need to be checked again. She states she started some OTC potassium recently due to leg tingling- I advised her it's hard to track how much potassium she is really getting with this.  Again stated to patient to follow up with Dr. Glori Bickers if symptoms worsen/ continue and she is adjusting her medications. She verbalizes understanding.

## 2016-10-08 ENCOUNTER — Other Ambulatory Visit: Payer: Self-pay

## 2016-10-08 MED ORDER — WARFARIN SODIUM 5 MG PO TABS: ORAL_TABLET | ORAL | 0 refills | Status: DC

## 2016-10-08 NOTE — Telephone Encounter (Signed)
Patient calls requesting refills to be sent in the [facility] for her Warfarin.  She is compliant with coumadin management, will refill X 3 months.

## 2016-10-13 ENCOUNTER — Ambulatory Visit (INDEPENDENT_AMBULATORY_CARE_PROVIDER_SITE_OTHER): Payer: Medicare Other

## 2016-10-13 ENCOUNTER — Telehealth: Payer: Self-pay | Admitting: Family Medicine

## 2016-10-13 DIAGNOSIS — Z5181 Encounter for therapeutic drug level monitoring: Secondary | ICD-10-CM

## 2016-10-13 DIAGNOSIS — I4821 Permanent atrial fibrillation: Secondary | ICD-10-CM

## 2016-10-13 DIAGNOSIS — I482 Chronic atrial fibrillation: Secondary | ICD-10-CM | POA: Diagnosis not present

## 2016-10-13 LAB — POCT INR: INR: 2.6

## 2016-10-13 NOTE — Patient Instructions (Signed)
Pre visit review using our clinic review tool, if applicable. No additional management support is needed unless otherwise documented below in the visit note. 

## 2016-10-13 NOTE — Telephone Encounter (Signed)
Form is in your in box

## 2016-10-13 NOTE — Telephone Encounter (Signed)
Done and in IN box 

## 2016-10-13 NOTE — Telephone Encounter (Signed)
Pt dropped off form to be filled out. Requesting referral to [facility]

## 2016-10-14 NOTE — Telephone Encounter (Signed)
Left voicemail requesting pt to call office, I wanted to make sure she wants [name] to mail off form or did she want to pick it up

## 2016-10-14 NOTE — Telephone Encounter (Signed)
Pt called back she request I mail the form right to the school, done

## 2016-10-19 ENCOUNTER — Other Ambulatory Visit: Payer: Self-pay | Admitting: General Surgery

## 2016-10-19 ENCOUNTER — Ambulatory Visit
Admission: RE | Admit: 2016-10-19 | Discharge: 2016-10-19 | Disposition: A | Discharge status: Home / Self Care | Payer: Medicare Other | Source: Ambulatory Visit | Attending: Family Medicine | Admitting: Family Medicine

## 2016-10-19 DIAGNOSIS — Z1231 Encounter for screening mammogram for malignant neoplasm of breast: Secondary | ICD-10-CM | POA: Diagnosis not present

## 2016-10-19 DIAGNOSIS — E2839 Other primary ovarian failure: Secondary | ICD-10-CM | POA: Diagnosis not present

## 2016-10-19 DIAGNOSIS — Z780 Asymptomatic menopausal state: Secondary | ICD-10-CM | POA: Diagnosis not present

## 2016-10-19 DIAGNOSIS — M8588 Other specified disorders of bone density and structure, other site: Secondary | ICD-10-CM | POA: Insufficient documentation

## 2016-10-19 DIAGNOSIS — M8589 Other specified disorders of bone density and structure, multiple sites: Secondary | ICD-10-CM | POA: Diagnosis not present

## 2016-10-19 DIAGNOSIS — K642 Third degree hemorrhoids: Secondary | ICD-10-CM | POA: Diagnosis not present

## 2016-10-19 NOTE — H&P (Signed)
History of Present Illness [name] Ruff MD; 7/84/6962 1:54 PM) The patient is a 83 year old female who presents with hemorrhoids. 83 year old female who presents to the office for evaluation of rectal bleeding that occurs on a daily basis and leakage of stool. She states that this has been present for several months. She reports 5-6 bowel movements a day. She is on long-standing Coumadin for atrial fibrillation. She has a history of hemorrhoidectomy many years ago.   Past Surgical History [name], LPN; 9/52/8413 2:44 PM) Appendectomy Hemorrhoidectomy Valve Replacement  Diagnostic Studies History [name], LPN; 0/12/2723 3:66 PM) Colonoscopy 1-5 years ago Mammogram 1-3 years ago Pap Smear 1-5 years ago  Allergies [name], LPN; 4/40/3474 2:59 PM) Statins Swelling. Penicillins Hives. Allergies Reconciled  Medication History [name], LPN; 5/63/8756 4:33 PM) Geronimo Boot XT (180MG  Capsule ER 24HR, Oral) Active. Furosemide (40MG  Tablet, Oral) Active. Levothyroxine Sodium (25MCG Tablet, Oral) Active. Ventolin HFA (108 (90 Base)MCG/ACT Aerosol Soln, Inhalation) Active. Warfarin Sodium (5MG  Tablet, Oral) Active. Vitamin D (Cholecalciferol) (1000UNIT Capsule, Oral) Active. Cyanocobalamin (1000MCG Tablet, Oral) Active. Claritin (10MG  Capsule, Oral) Active. Medications Reconciled  Social History [name], LPN; 2/95/1884 1:66 PM) Alcohol use Moderate alcohol use. Caffeine use Coffee, Tea. No drug use Tobacco use Never smoker.  Family History [name], LPN; 0/63/0160 1:09 PM) Cancer Father. Respiratory Condition Father.  Pregnancy / Birth History [name], LPN; 06/13/5571 2:20 PM) Age at menarche 48 years. Age of menopause 10-60 Gravida 7 Length (months) of breastfeeding 12-24 Maternal age 81-20 Para 78  Other Problems [name], LPN; 2/54/2706 2:37 PM) Asthma Atrial Fibrillation Heart  murmur Other disease, cancer, significant illness     Review of Systems [name] Dockery LPN; 09/17/3149 7:61 PM) General Present- Fatigue and Weight Gain. Not Present- Appetite Loss, Chills, Fever, Night Sweats and Weight Loss. Skin Present- Dryness and Non-Healing Wounds. Not Present- Change in Wart/Mole, Hives, Jaundice, New Lesions, Rash and Ulcer. HEENT Present- Hearing Loss, Ringing in the Ears, Seasonal Allergies and Wears glasses/contact lenses. Not Present- Earache, Hoarseness, Nose Bleed, Oral Ulcers, Sinus Pain, Sore Throat, Visual Disturbances and Yellow Eyes. Respiratory Not Present- Bloody sputum, Chronic Cough, Difficulty Breathing, Snoring and Wheezing. Breast Not Present- Breast Mass, Breast Pain, Nipple Discharge and Skin Changes. Cardiovascular Present- Leg Cramps and Swelling of Extremities. Not Present- Chest Pain, Difficulty Breathing Lying Down, Palpitations, Rapid Heart Rate and Shortness of Breath. Gastrointestinal Present- Bloody Stool and Hemorrhoids. Not Present- Abdominal Pain, Bloating, Change in Bowel Habits, Chronic diarrhea, Constipation, Difficulty Swallowing, Excessive gas, Gets full quickly at meals, Indigestion, Nausea, Rectal Pain and Vomiting. Musculoskeletal Present- Joint Pain, Joint Stiffness, Muscle Weakness and Swelling of Extremities. Not Present- Back Pain and Muscle Pain. Neurological Present- Tingling and Trouble walking. Not Present- Decreased Memory, Fainting, Headaches, Numbness, Seizures, Tremor and Weakness. Psychiatric Not Present- Anxiety, Bipolar, Change in Sleep Pattern, Depression, Fearful and Frequent crying. Endocrine Not Present- Cold Intolerance, Excessive Hunger, Hair Changes, Heat Intolerance, Hot flashes and New Diabetes. Hematology Present- Blood Thinners and Easy Bruising. Not Present- Excessive bleeding, Gland problems, HIV and Persistent Infections.  Vitals [name] Dockery LPN; 08/27/3708 6:26 PM) 10/19/2016 1:48 PM Weight: 172  lb Height: 64in Body Surface Area: 1.83 m Body Mass Index: 29.52 kg/m  Temp.: 97.33F(Oral)  Pulse: 96 (Regular)  BP: 120/70 (Sitting, Left Arm, Standard)      Physical Exam [name] Ruff MD; 9/48/5462 2:07 PM)  General Mental Status-Alert. General Appearance-Not in acute distress. Build & Nutrition-Well nourished. Posture-Normal posture. Gait-Normal.  Head and Neck  Head-normocephalic, atraumatic with no lesions or palpable masses. Trachea-midline.  Chest and Lung Exam Chest and lung exam reveals -on auscultation, normal breath sounds, no adventitious sounds and normal vocal resonance.  Cardiovascular Cardiovascular examination reveals -normal heart sounds, regular rate and rhythm with no murmurs and no digital clubbing, cyanosis, edema, increased warmth or tenderness.  Abdomen Inspection Inspection of the abdomen reveals - No Hernias. Palpation/Percussion Palpation and Percussion of the abdomen reveal - Soft, Non Tender, No Rigidity (guarding), No hepatosplenomegaly and No Palpable abdominal masses.  Rectal Anorectal Exam External - normal external exam. Note: moderate sphincter tone.  Neurologic Neurologic evaluation reveals -alert and oriented x 3 with no impairment of recent or remote memory, normal attention span and ability to concentrate, normal sensation and normal coordination.  Musculoskeletal Normal Exam - Bilateral-Upper Extremity Strength Normal and Lower Extremity Strength Normal.   Results [name] Ruff MD; 11/21/5174 2:20 PM) Procedures  Name Value Date ANOSCOPY, DIAGNOSTIC (16073) [ Hemorrhoids ] Procedure Other: Procedure: Anoscopy Surgeon: Marcello Moores After the risks and benefits were explained, verbal consent was obtained for above procedure. A medical assistant chaperone was present thoroughout the entire procedure. Anesthesia: none Diagnosis: Rectal bleeding Findings: Right posterior grade 3 internal  hemorrhoid, grade 1 internal hemorrhoids other 2 positions.  Performed: 10/19/2016 2:04 PM    Assessment & Plan [name] Ruff MD; 09/30/6267 2:05 PM)  PROLAPSED INTERNAL HEMORRHOIDS, GRADE 3 (S85.4) Impression: 83 year old female on Coumadin for atrial fibrillation who presents to the office with a bleeding hemorrhoid. On exam today she appears to have a right posterior internal hemorrhoid with grade 3 prolapse. We discussed options for treatment which include rubber band ligation and hemorrhoidectomy. She has elected to proceed with hemorrhoidectomy as it has a higher chance of resolving the issue and also a higher chance of postoperative bleeding. I do not have a preference whether or not she should come off Coumadin prior to surgery.

## 2016-10-20 ENCOUNTER — Encounter (HOSPITAL_BASED_OUTPATIENT_CLINIC_OR_DEPARTMENT_OTHER): Payer: Self-pay | Admitting: *Deleted

## 2016-10-21 ENCOUNTER — Encounter (HOSPITAL_BASED_OUTPATIENT_CLINIC_OR_DEPARTMENT_OTHER): Payer: Self-pay | Admitting: *Deleted

## 2016-10-21 NOTE — Progress Notes (Addendum)
NPO AFTER MN W/ EXCEPTION CLEAR LIQUIDS UNTIL 0630 (NO CREAM/ MILK PRODUCTS).  ARRIVE AT 1100.  NEEDS ISTAT 8 AND EKG.  PT HAD CURRENT PT/INR DONE 10-13-2016 AT 2.6 W/ CHART.  PER DR THOMAS PT IS TO CONTINUE COUMADIN.  WILL TAKE CARTIA AND SYNTHROID AM DOS W/ SIPS OF WATER AND BRING RESCUE INHALER.  ADDENDUM:  REVIEWED CHART W/ DR MOSER MDA, FACE TO FACE.  DR MOSER STATED OK TO PROCEED.

## 2016-10-25 ENCOUNTER — Encounter: Payer: Self-pay | Admitting: Family Medicine

## 2016-10-25 DIAGNOSIS — M8580 Other specified disorders of bone density and structure, unspecified site: Secondary | ICD-10-CM | POA: Insufficient documentation

## 2016-10-27 ENCOUNTER — Encounter: Payer: Self-pay | Admitting: Vascular Surgery

## 2016-10-29 ENCOUNTER — Ambulatory Visit (HOSPITAL_BASED_OUTPATIENT_CLINIC_OR_DEPARTMENT_OTHER): Payer: Medicare Other | Admitting: Anesthesiology

## 2016-10-29 ENCOUNTER — Ambulatory Visit (HOSPITAL_BASED_OUTPATIENT_CLINIC_OR_DEPARTMENT_OTHER)
Admission: RE | Admit: 2016-10-29 | Discharge: 2016-10-29 | Disposition: A | Discharge status: Home / Self Care | Payer: Medicare Other | Source: Ambulatory Visit | Attending: General Surgery | Admitting: General Surgery

## 2016-10-29 ENCOUNTER — Encounter (HOSPITAL_BASED_OUTPATIENT_CLINIC_OR_DEPARTMENT_OTHER): Payer: Self-pay

## 2016-10-29 ENCOUNTER — Encounter (HOSPITAL_BASED_OUTPATIENT_CLINIC_OR_DEPARTMENT_OTHER)
Admission: RE | Disposition: A | Discharge status: Home / Self Care | Payer: Self-pay | Source: Ambulatory Visit | Attending: General Surgery

## 2016-10-29 ENCOUNTER — Other Ambulatory Visit: Payer: Self-pay

## 2016-10-29 DIAGNOSIS — K921 Melena: Secondary | ICD-10-CM | POA: Diagnosis not present

## 2016-10-29 DIAGNOSIS — K625 Hemorrhage of anus and rectum: Secondary | ICD-10-CM | POA: Diagnosis not present

## 2016-10-29 DIAGNOSIS — K642 Third degree hemorrhoids: Secondary | ICD-10-CM | POA: Insufficient documentation

## 2016-10-29 DIAGNOSIS — Z7901 Long term (current) use of anticoagulants: Secondary | ICD-10-CM | POA: Insufficient documentation

## 2016-10-29 DIAGNOSIS — E039 Hypothyroidism, unspecified: Secondary | ICD-10-CM | POA: Diagnosis not present

## 2016-10-29 DIAGNOSIS — I4891 Unspecified atrial fibrillation: Secondary | ICD-10-CM | POA: Insufficient documentation

## 2016-10-29 DIAGNOSIS — K6289 Other specified diseases of anus and rectum: Secondary | ICD-10-CM | POA: Diagnosis not present

## 2016-10-29 DIAGNOSIS — E785 Hyperlipidemia, unspecified: Secondary | ICD-10-CM | POA: Diagnosis not present

## 2016-10-29 DIAGNOSIS — K649 Unspecified hemorrhoids: Secondary | ICD-10-CM | POA: Diagnosis not present

## 2016-10-29 HISTORY — DX: Unspecified diastolic (congestive) heart failure: I50.30

## 2016-10-29 HISTORY — DX: Unspecified hemorrhoids: K64.9

## 2016-10-29 HISTORY — DX: Mixed incontinence: N39.46

## 2016-10-29 HISTORY — DX: Personal history of other diseases of the circulatory system: Z86.79

## 2016-10-29 HISTORY — DX: Unspecified osteoarthritis, unspecified site: M19.90

## 2016-10-29 HISTORY — DX: Personal history of transient ischemic attack (TIA), and cerebral infarction without residual deficits: Z86.73

## 2016-10-29 HISTORY — DX: Asymptomatic varicose veins of unspecified lower extremity: I83.90

## 2016-10-29 HISTORY — DX: Other specified postprocedural states: Z98.890

## 2016-10-29 HISTORY — DX: Mild intermittent asthma, uncomplicated: J45.20

## 2016-10-29 HISTORY — DX: Diverticulosis of large intestine without perforation or abscess without bleeding: K57.30

## 2016-10-29 HISTORY — DX: Localized edema: R60.0

## 2016-10-29 HISTORY — DX: Personal history of in-situ neoplasm of skin: Z86.007

## 2016-10-29 HISTORY — DX: Personal history of colonic polyps: Z86.010

## 2016-10-29 HISTORY — DX: Presence of spectacles and contact lenses: Z97.3

## 2016-10-29 HISTORY — DX: Personal history of adenomatous and serrated colon polyps: Z86.0101

## 2016-10-29 HISTORY — PX: HEMORRHOID SURGERY: SHX153

## 2016-10-29 LAB — POCT I-STAT, CHEM 8
BUN: 21 mg/dL — ABNORMAL HIGH (ref 6–20)
CALCIUM ION: 1.21 mmol/L (ref 1.15–1.40)
Chloride: 105 mmol/L (ref 101–111)
Creatinine, Ser: 0.7 mg/dL (ref 0.44–1.00)
Glucose, Bld: 90 mg/dL (ref 65–99)
HCT: 36 % (ref 36.0–46.0)
HEMOGLOBIN: 12.2 g/dL (ref 12.0–15.0)
Potassium: 3.7 mmol/L (ref 3.5–5.1)
Sodium: 142 mmol/L (ref 135–145)
TCO2: 26 mmol/L (ref 0–100)

## 2016-10-29 SURGERY — HEMORRHOIDECTOMY
Anesthesia: Monitor Anesthesia Care | Site: Rectum

## 2016-10-29 MED ORDER — BUPIVACAINE-EPINEPHRINE 0.5% -1:200000 IJ SOLN
INTRAMUSCULAR | Status: DC | PRN
  Administered 2016-10-29: 30 mL

## 2016-10-29 MED ORDER — OXYCODONE HCL 5 MG PO TABS: 5.0000 mg | ORAL_TABLET | Freq: Four times a day (QID) | ORAL | 0 refills | Status: DC | PRN

## 2016-10-29 MED ORDER — SODIUM CHLORIDE 0.9% FLUSH
3.0000 mL | Freq: Two times a day (BID) | INTRAVENOUS | Status: DC
  Filled 2016-10-29: qty 3

## 2016-10-29 MED ORDER — ACETAMINOPHEN 650 MG RE SUPP
650.0000 mg | RECTAL | Status: DC | PRN
  Filled 2016-10-29: qty 1

## 2016-10-29 MED ORDER — FENTANYL CITRATE (PF) 100 MCG/2ML IJ SOLN
INTRAMUSCULAR | Status: AC
  Filled 2016-10-29: qty 2

## 2016-10-29 MED ORDER — FENTANYL CITRATE (PF) 100 MCG/2ML IJ SOLN
25.0000 ug | INTRAMUSCULAR | Status: DC | PRN
  Filled 2016-10-29: qty 1

## 2016-10-29 MED ORDER — FENTANYL CITRATE (PF) 100 MCG/2ML IJ SOLN
INTRAMUSCULAR | Status: DC | PRN
  Administered 2016-10-29: 50 ug via INTRAVENOUS

## 2016-10-29 MED ORDER — LACTATED RINGERS IV SOLN
INTRAVENOUS | Status: DC
  Administered 2016-10-29: 12:00:00 via INTRAVENOUS
  Filled 2016-10-29: qty 1000

## 2016-10-29 MED ORDER — PROPOFOL 10 MG/ML IV BOLUS
INTRAVENOUS | Status: DC | PRN
  Administered 2016-10-29: 40 mg via INTRAVENOUS

## 2016-10-29 MED ORDER — PROPOFOL 10 MG/ML IV BOLUS
INTRAVENOUS | Status: AC
  Filled 2016-10-29: qty 20

## 2016-10-29 MED ORDER — LIDOCAINE 5 % EX OINT
TOPICAL_OINTMENT | CUTANEOUS | Status: DC | PRN
  Administered 2016-10-29: 1

## 2016-10-29 MED ORDER — SODIUM CHLORIDE 0.9% FLUSH
3.0000 mL | INTRAVENOUS | Status: DC | PRN
  Filled 2016-10-29: qty 3

## 2016-10-29 MED ORDER — PROPOFOL 500 MG/50ML IV EMUL
INTRAVENOUS | Status: AC
  Filled 2016-10-29: qty 50

## 2016-10-29 MED ORDER — PROPOFOL 500 MG/50ML IV EMUL
INTRAVENOUS | Status: DC | PRN
  Administered 2016-10-29: 75 ug/kg/min via INTRAVENOUS

## 2016-10-29 MED ORDER — ACETAMINOPHEN 325 MG PO TABS
650.0000 mg | ORAL_TABLET | ORAL | Status: DC | PRN
  Filled 2016-10-29: qty 2

## 2016-10-29 MED ORDER — MEPERIDINE HCL 25 MG/ML IJ SOLN
6.2500 mg | INTRAMUSCULAR | Status: DC | PRN
  Filled 2016-10-29: qty 1

## 2016-10-29 MED ORDER — SODIUM CHLORIDE 0.9 % IV SOLN
250.0000 mL | INTRAVENOUS | Status: DC | PRN
  Filled 2016-10-29: qty 250

## 2016-10-29 MED ORDER — OXYCODONE HCL 5 MG PO TABS
5.0000 mg | ORAL_TABLET | ORAL | Status: DC | PRN
Start: 2016-10-29 — End: 2016-10-29
  Filled 2016-10-29: qty 2

## 2016-10-29 MED ORDER — METOCLOPRAMIDE HCL 5 MG/ML IJ SOLN
10.0000 mg | Freq: Once | INTRAMUSCULAR | Status: DC | PRN
  Filled 2016-10-29: qty 2

## 2016-10-29 MED ORDER — BUPIVACAINE LIPOSOME 1.3 % IJ SUSP
INTRAMUSCULAR | Status: DC | PRN
  Administered 2016-10-29: 20 mL

## 2016-10-29 SURGICAL SUPPLY — 40 items
BLADE HEX COATED 2.75 (ELECTRODE) ×3 IMPLANT
BLADE SURG 10 STRL SS (BLADE) ×3 IMPLANT
BRIEF STRETCH FOR OB PAD LRG (UNDERPADS AND DIAPERS) ×2 IMPLANT
COVER BACK TABLE 60X90IN (DRAPES) ×3 IMPLANT
COVER MAYO STAND STRL (DRAPES) ×3 IMPLANT
DRAPE LAPAROTOMY 100X72 PEDS (DRAPES) ×3 IMPLANT
DRAPE UTILITY XL STRL (DRAPES) ×3 IMPLANT
ELECT BLADE 6.5 .24CM SHAFT (ELECTRODE) IMPLANT
ELECT REM PT RETURN 9FT ADLT (ELECTROSURGICAL) ×3
ELECTRODE REM PT RTRN 9FT ADLT (ELECTROSURGICAL) ×1 IMPLANT
GAUZE SPONGE 4X4 12PLY STRL LF (GAUZE/BANDAGES/DRESSINGS) ×2 IMPLANT
GAUZE SPONGE 4X4 16PLY XRAY LF (GAUZE/BANDAGES/DRESSINGS) IMPLANT
GLOVE BIO SURGEON STRL SZ 6.5 (GLOVE) ×2 IMPLANT
GLOVE BIO SURGEONS STRL SZ 6.5 (GLOVE) ×1
GLOVE INDICATOR 7.0 STRL GRN (GLOVE) ×3 IMPLANT
GOWN SPEC L3 XXLG W/TWL (GOWN DISPOSABLE) ×4 IMPLANT
KIT RM TURNOVER CYSTO AR (KITS) ×3 IMPLANT
NEEDLE HYPO 22GX1.5 SAFETY (NEEDLE) ×3 IMPLANT
NS IRRIG 500ML POUR BTL (IV SOLUTION) ×3 IMPLANT
PACK BASIN DAY SURGERY FS (CUSTOM PROCEDURE TRAY) ×3 IMPLANT
PAD ABD 8X10 STRL (GAUZE/BANDAGES/DRESSINGS) ×3 IMPLANT
PAD ARMBOARD 7.5X6 YLW CONV (MISCELLANEOUS) ×4 IMPLANT
PENCIL BUTTON HOLSTER BLD 10FT (ELECTRODE) ×3 IMPLANT
SPONGE GAUZE 4X4 12PLY (GAUZE/BANDAGES/DRESSINGS) ×3 IMPLANT
SPONGE SURGIFOAM ABS GEL 100 (HEMOSTASIS) IMPLANT
SPONGE SURGIFOAM ABS GEL 12-7 (HEMOSTASIS) IMPLANT
SUT CHROMIC 2 0 SH (SUTURE) ×2 IMPLANT
SUT CHROMIC 3 0 SH 27 (SUTURE) ×2 IMPLANT
SUT PROLENE 2 0 BLUE (SUTURE) IMPLANT
SUT VIC AB 2-0 SH 27 (SUTURE)
SUT VIC AB 2-0 SH 27XBRD (SUTURE) IMPLANT
SUT VIC AB 4-0 P-3 18XBRD (SUTURE) IMPLANT
SUT VIC AB 4-0 P3 18 (SUTURE)
SUT VIC AB 4-0 SH 18 (SUTURE) IMPLANT
SYR CONTROL 10ML LL (SYRINGE) ×3 IMPLANT
TRAY DSU PREP LF (CUSTOM PROCEDURE TRAY) ×3 IMPLANT
TUBE CONNECTING 12'X1/4 (SUCTIONS) ×1
TUBE CONNECTING 12X1/4 (SUCTIONS) ×2 IMPLANT
WATER STERILE IRR 500ML POUR (IV SOLUTION) IMPLANT
YANKAUER SUCT BULB TIP NO VENT (SUCTIONS) ×3 IMPLANT

## 2016-10-29 NOTE — Op Note (Addendum)
10/29/2016  12:53 PM  PATIENT:  [name]  83 y.o. female  Patient Care Team: Tower, Wynelle Fanny, MD as PCP - General  PRE-OPERATIVE DIAGNOSIS:  rectal bleeding, grade 3 hemorrhoid  POST-OPERATIVE DIAGNOSIS:  rectal bleeding, grade 3 R posterior internal hemorrhoid, L lateral anal canal lesion  PROCEDURE:   HEMORRHOIDECTOMY, biopsy of mucosal lesion    Surgeon(s): Leighton Ruff, MD  ASSISTANT: none   ANESTHESIA:   local and MAC  SPECIMEN:  Source of Specimen:  R anterior internal hemorrhoid, L lateral anal canal lesion  DISPOSITION OF SPECIMEN:  PATHOLOGY  COUNTS:  YES  PLAN OF CARE: Discharge to home after PACU  PATIENT DISPOSITION:  PACU - hemodynamically stable.  INDICATION: 83 y.o. F on anticoagulation with recurrent rectal bleeding.  Office exam revealed a grade 3 hemorrhoid   OR FINDINGS: grade 3 right anterior internal hemorrhoid, grade 2 L lateral internal hemorrhoid with abnormal mucosa  DESCRIPTION: the patient was identified in the preoperative holding area and taken to the OR where they were laid on the operating room table.  MAC anesthesia was induced without difficulty. The patient was then positioned in prone jackknife position with buttocks gently taped apart.  The patient was then prepped and draped in usual sterile fashion.  SCDs were noted to be in place prior to the initiation of anesthesia. A surgical timeout was performed indicating the correct patient, procedure, positioning and need for preoperative antibiotics.  A rectal block was performed using Marcaine with epinephrine and Exparel.    I began with a digital rectal exam.  There were no masses.  Sphincter tone was moderate.  I then placed a Hill-Ferguson anoscope into the anal canal and evaluated this completely.  There was a grade 3 right anterior internal hemorrhoid. There was also a grade 2 left lateral internal hemorrhoid with an area of abnormal appearing mucosa over it. I performed a  hemorrhoidectomy by elevating the right-sided hemorrhoid with an Allis clamp. I then made an incision in the anoderm and dissected down to the sphincter complex using blunt dissection with Metzenbaum scissors. I then excised all tissue above this pinked her complex using the Metzenbaum scissors down to the level of the proximal anal canal. This was sent to pathology for further examination. The internal anal mucosa was reapproximated using a running 2-0 chromic suture. The anoderm was reapproximated using a running 3-0 chromic suture. I then terminated attention to the left lateral anal canal. I decided to excise the area of abnormal mucosa. I then performed a hemorrhoid pexy for hemostasis of this area using a 3-0 chromic suture. Once this was complete and hemostasis was achieved, the remaining Exparel was put around the 2 incisions for added postoperative anesthesia. A dressing was then applied over this. All counts were correct per operating room staff. The patient was awakened from anesthesia and sent to the postanesthesia care unit in stable condition.    I have reviewed the [place] and see no prescriptions listed for patient above

## 2016-10-29 NOTE — Anesthesia Preprocedure Evaluation (Signed)
Anesthesia Evaluation  Patient identified by MRN, date of birth, ID band Patient awake    Reviewed: Allergy & Precautions, NPO status , Patient's Chart, lab work & pertinent test results  Airway Mallampati: II  TM Distance: >3 FB Neck ROM: Full    Dental no notable dental hx.    Pulmonary neg pulmonary ROS,    Pulmonary exam normal breath sounds clear to auscultation       Cardiovascular + dysrhythmias Atrial Fibrillation  Rhythm:Irregular Rate:Normal  S/p MVR S/p ablation   Neuro/Psych negative neurological ROS  negative psych ROS   GI/Hepatic negative GI ROS, Neg liver ROS,   Endo/Other  Hypothyroidism   Renal/GU negative Renal ROS  negative genitourinary   Musculoskeletal negative musculoskeletal ROS (+)   Abdominal   Peds negative pediatric ROS (+)  Hematology negative hematology ROS (+)   Anesthesia Other Findings   Reproductive/Obstetrics negative OB ROS                             Anesthesia Physical Anesthesia Plan  ASA: III  Anesthesia Plan: MAC   Post-op Pain Management:    Induction:   PONV Risk Score and Plan: 2 and Ondansetron, Dexamethasone and Treatment may vary due to age or medical condition  Airway Management Planned: Simple Face Mask  Additional Equipment:   Intra-op Plan:   Post-operative Plan:   Informed Consent: I have reviewed the patients History and Physical, chart, labs and discussed the procedure including the risks, benefits and alternatives for the proposed anesthesia with the patient or authorized representative who has indicated his/her understanding and acceptance.   Dental advisory given  Plan Discussed with:   Anesthesia Plan Comments:         Anesthesia Quick Evaluation

## 2016-10-29 NOTE — H&P (View-Only) (Signed)
History of Present Illness Leighton Ruff MD; 07/17/621 1:54 PM) The patient is a 83 year old female who presents with hemorrhoids. 83 year old female who presents to the office for evaluation of rectal bleeding that occurs on a daily basis and leakage of stool. She states that this has been present for several months. She reports 5-6 bowel movements a day. She is on long-standing Coumadin for atrial fibrillation. She has a history of hemorrhoidectomy many years ago.   Past Surgical History [name], LPN; 7/62/8315 1:76 PM) Appendectomy Hemorrhoidectomy Valve Replacement  Diagnostic Studies History [name], LPN; 1/60/7371 0:62 PM) Colonoscopy 1-5 years ago Mammogram 1-3 years ago Pap Smear 1-5 years ago  Allergies [name], LPN; 6/94/8546 2:70 PM) Statins Swelling. Penicillins Hives. Allergies Reconciled  Medication History [name], LPN; 3/50/0938 1:82 PM) Geronimo Boot XT (180MG  Capsule ER 24HR, Oral) Active. Furosemide (40MG  Tablet, Oral) Active. Levothyroxine Sodium (25MCG Tablet, Oral) Active. Ventolin HFA (108 (90 Base)MCG/ACT Aerosol Soln, Inhalation) Active. Warfarin Sodium (5MG  Tablet, Oral) Active. Vitamin D (Cholecalciferol) (1000UNIT Capsule, Oral) Active. Cyanocobalamin (1000MCG Tablet, Oral) Active. Claritin (10MG  Capsule, Oral) Active. Medications Reconciled  Social History [name], LPN; 9/93/7169 6:78 PM) Alcohol use Moderate alcohol use. Caffeine use Coffee, Tea. No drug use Tobacco use Never smoker.  Family History [name], LPN; 9/38/1017 5:10 PM) Cancer Father. Respiratory Condition Father.  Pregnancy / Birth History [name], LPN; 2/58/5277 8:24 PM) Age at menarche 69 years. Age of menopause 27-60 Gravida 7 Length (months) of breastfeeding 12-24 Maternal age 33-20 Para 1  Other Problems [name], LPN; 2/35/3614 4:31 PM) Asthma Atrial Fibrillation Heart  murmur Other disease, cancer, significant illness     Review of Systems [name] Dockery LPN; 5/40/0867 6:19 PM) General Present- Fatigue and Weight Gain. Not Present- Appetite Loss, Chills, Fever, Night Sweats and Weight Loss. Skin Present- Dryness and Non-Healing Wounds. Not Present- Change in Wart/Mole, Hives, Jaundice, New Lesions, Rash and Ulcer. HEENT Present- Hearing Loss, Ringing in the Ears, Seasonal Allergies and Wears glasses/contact lenses. Not Present- Earache, Hoarseness, Nose Bleed, Oral Ulcers, Sinus Pain, Sore Throat, Visual Disturbances and Yellow Eyes. Respiratory Not Present- Bloody sputum, Chronic Cough, Difficulty Breathing, Snoring and Wheezing. Breast Not Present- Breast Mass, Breast Pain, Nipple Discharge and Skin Changes. Cardiovascular Present- Leg Cramps and Swelling of Extremities. Not Present- Chest Pain, Difficulty Breathing Lying Down, Palpitations, Rapid Heart Rate and Shortness of Breath. Gastrointestinal Present- Bloody Stool and Hemorrhoids. Not Present- Abdominal Pain, Bloating, Change in Bowel Habits, Chronic diarrhea, Constipation, Difficulty Swallowing, Excessive gas, Gets full quickly at meals, Indigestion, Nausea, Rectal Pain and Vomiting. Musculoskeletal Present- Joint Pain, Joint Stiffness, Muscle Weakness and Swelling of Extremities. Not Present- Back Pain and Muscle Pain. Neurological Present- Tingling and Trouble walking. Not Present- Decreased Memory, Fainting, Headaches, Numbness, Seizures, Tremor and Weakness. Psychiatric Not Present- Anxiety, Bipolar, Change in Sleep Pattern, Depression, Fearful and Frequent crying. Endocrine Not Present- Cold Intolerance, Excessive Hunger, Hair Changes, Heat Intolerance, Hot flashes and New Diabetes. Hematology Present- Blood Thinners and Easy Bruising. Not Present- Excessive bleeding, Gland problems, HIV and Persistent Infections.  Vitals [name] Dockery LPN; 07/29/3265 1:24 PM) 10/19/2016 1:48 PM Weight: 172  lb Height: 64in Body Surface Area: 1.83 m Body Mass Index: 29.52 kg/m  Temp.: 97.65F(Oral)  Pulse: 96 (Regular)  BP: 120/70 (Sitting, Left Arm, Standard)      Physical Exam Leighton Ruff MD; 5/80/9983 2:07 PM)  General Mental Status-Alert. General Appearance-Not in acute distress. Build & Nutrition-Well nourished. Posture-Normal posture. Gait-Normal.  Head and Neck  Head-normocephalic, atraumatic with no lesions or palpable masses. Trachea-midline.  Chest and Lung Exam Chest and lung exam reveals -on auscultation, normal breath sounds, no adventitious sounds and normal vocal resonance.  Cardiovascular Cardiovascular examination reveals -normal heart sounds, regular rate and rhythm with no murmurs and no digital clubbing, cyanosis, edema, increased warmth or tenderness.  Abdomen Inspection Inspection of the abdomen reveals - No Hernias. Palpation/Percussion Palpation and Percussion of the abdomen reveal - Soft, Non Tender, No Rigidity (guarding), No hepatosplenomegaly and No Palpable abdominal masses.  Rectal Anorectal Exam External - normal external exam. Note: moderate sphincter tone.  Neurologic Neurologic evaluation reveals -alert and oriented x 3 with no impairment of recent or remote memory, normal attention span and ability to concentrate, normal sensation and normal coordination.  Musculoskeletal Normal Exam - Bilateral-Upper Extremity Strength Normal and Lower Extremity Strength Normal.   Results Leighton Ruff MD; 11/24/90 2:20 PM) Procedures  Name Value Date ANOSCOPY, DIAGNOSTIC (33007) [ Hemorrhoids ] Procedure Other: Procedure: Anoscopy Surgeon: Marcello Moores After the risks and benefits were explained, verbal consent was obtained for above procedure. A medical assistant chaperone was present thoroughout the entire procedure. Anesthesia: none Diagnosis: Rectal bleeding Findings: Right posterior grade 3 internal  hemorrhoid, grade 1 internal hemorrhoids other 2 positions.  Performed: 10/19/2016 2:04 PM    Assessment & Plan Leighton Ruff MD; 09/12/6331 2:05 PM)  PROLAPSED INTERNAL HEMORRHOIDS, GRADE 3 (L45.6) Impression: 83 year old female on Coumadin for atrial fibrillation who presents to the office with a bleeding hemorrhoid. On exam today she appears to have a right posterior internal hemorrhoid with grade 3 prolapse. We discussed options for treatment which include rubber band ligation and hemorrhoidectomy. She has elected to proceed with hemorrhoidectomy as it has a higher chance of resolving the issue and also a higher chance of postoperative bleeding. I do not have a preference whether or not she should come off Coumadin prior to surgery.

## 2016-10-29 NOTE — Interval H&P Note (Signed)
History and Physical Interval Note:  10/29/2016 12:12 PM  [name]  has presented today for surgery, with the diagnosis of rectal bleeding  The various methods of treatment have been discussed with the patient and family. After consideration of risks, benefits and other options for treatment, the patient has consented to  Procedure(s): HEMORRHOIDECTOMY (N/A) as a surgical intervention .  The patient's history has been reviewed, patient examined, no change in status, stable for surgery.  I have reviewed the patient's chart and labs.  Questions were answered to the patient's satisfaction.     Rosario Adie, MD  [facility]

## 2016-10-29 NOTE — Discharge Instructions (Addendum)
ANORECTAL SURGERY: POST OP INSTRUCTIONS °1. Take your usually prescribed home medications unless otherwise directed. °2. DIET: During the first few hours after surgery sip on some liquids until you are able to urinate.  It is normal to not urinate for several hours after this surgery.  If you feel uncomfortable, please contact the office for instructions.  After you are able to urinate,you may eat, if you feel like it.  Follow a light bland diet the first 24 hours after arrival home, such as soup, liquids, crackers, etc.  Be sure to include lots of fluids daily (6-8 glasses).  Avoid fast food or heavy meals, as your are more likely to get nauseated.  Eat a low fat diet the next few days after surgery.  Limit caffeine intake to 1-2 servings a day. °3. PAIN CONTROL: °a. Pain is best controlled by a usual combination of several different methods TOGETHER: °i. Muscle relaxation: Soak in a warm bath (or Sitz bath) three times a day and after bowel movements.  Continue to do this until all pain is resolved. °ii. Over the counter pain medication °iii. Prescription pain medication °b. Most patients will experience some swelling and discomfort in the anus/rectal area and incisions.  Heat such as warm towels, sitz baths, warm baths, etc to help relax tight/sore spots and speed recovery.  Some people prefer to use ice, especially in the first couple days after surgery, as it may decrease the pain and swelling, or alternate between ice & heat.  Experiment to what works for you.  Swelling and bruising can take several weeks to resolve.  Pain can take even longer to completely resolve. °c. It is helpful to take an over-the-counter pain medication regularly for the first few weeks.  Choose one of the following that works best for you: °i. Naproxen (Aleve, etc)  Two 220mg tabs twice a day °ii. Ibuprofen (Advil, etc) Three 200mg tabs four times a day (every meal & bedtime) °d. A  prescription for pain medication (such as percocet,  oxycodone, hydrocodone, etc) should be given to you upon discharge.  Take your pain medication as prescribed.  °i. If you are having problems/concerns with the prescription medicine (does not control pain, nausea, vomiting, rash, itching, etc), please call us [phone] to see if we need to switch you to a different pain medicine that will work better for you and/or control your side effect better. °ii. If you need a refill on your pain medication, please contact your pharmacy.  They will contact our office to request authorization. Prescriptions will not be filled after 5 pm or on week-ends. °4. KEEP YOUR BOWELS REGULAR and AVOID CONSTIPATION °a. The goal is one to two soft bowel movements a day.  You should at least have a bowel movement every other day. °b. Avoid getting constipated.  Between the surgery and the pain medications, it is common to experience some constipation. This can be very painful after rectal surgery.  Increasing fluid intake and taking a fiber supplement (such as Metamucil, Citrucel, FiberCon, etc) 1-2 times a day regularly will usually help prevent this problem from occurring.  A stool softener like colace is also recommended.  This can be purchased over the counter at your pharmacy.  You can take it up to 3 times a day.  If you do not have a bowel movement after 24 hrs since your surgery, take one does of milk of magnesia.  If you still haven't had a bowel movement 8-12 hours after   that dose, take another dose.  If you don't have a bowel movement 48 hrs after surgery, purchase a Fleets enema from the drug store and administer gently per package instructions.  If you still are having trouble with your bowel movements after that, please call the office for further instructions. °c. If you develop diarrhea or have many loose bowel movements, simplify your diet to bland foods & liquids for a few days.  Stop any stool softeners and decrease your fiber supplement.  Switching to mild  anti-diarrheal medications (Kayopectate, Pepto Bismol) can help.  If this worsens or does not improve, please call us. ° °5. Wound Care °a. Remove your bandages before your first bowel movement or 8 hours after surgery.     °b. Remove any wound packing material at this tim,e as well.  You do not need to repack the wound unless instructed otherwise.  Wear an absorbent pad or soft cotton gauze in your underwear to catch any drainage and help keep the area clean. You should change this every 2-3 hours while awake. °c. Keep the area clean and dry.  Bathe / shower every day, especially after bowel movements.  Keep the area clean by showering / bathing over the incision / wound.   It is okay to soak an open wound to help wash it.  Wet wipes or showers / gentle washing after bowel movements is often less traumatic than regular toilet paper. °d. You may have some styrofoam-like soft packing in the rectum which will come out with the first bowel movement.  °e. You will often notice bleeding with bowel movements.  This should slow down by the end of the first week of surgery °f. Expect some drainage.  This should slow down, too, by the end of the first week of surgery.  Wear an absorbent pad or soft cotton gauze in your underwear until the drainage stops. °g. Do Not sit on a rubber or pillow ring.  This can make you symptoms worse.  You may sit on a soft pillow if needed.  °6. ACTIVITIES as tolerated:   °a. You may resume regular (light) daily activities beginning the next day--such as daily self-care, walking, climbing stairs--gradually increasing activities as tolerated.  If you can walk 30 minutes without difficulty, it is safe to try more intense activity such as jogging, treadmill, bicycling, low-impact aerobics, swimming, etc. °b. Save the most intensive and strenuous activity for last such as sit-ups, heavy lifting, contact sports, etc  Refrain from any heavy lifting or straining until you are off narcotics for pain  control.   °c. You may drive when you are no longer taking prescription pain medication, you can comfortably sit for long periods of time, and you can safely maneuver your car and apply brakes. °d. You may have sexual intercourse when it is comfortable.  °7. FOLLOW UP in our office °a. Please call [facility] at [phone] to set up an appointment to see your surgeon in the office for a follow-up appointment approximately 3-4 weeks after your surgery. °b. Make sure that you call for this appointment the day you arrive home to insure a convenient appointment time. °10. IF YOU HAVE DISABILITY OR FAMILY LEAVE FORMS, BRING THEM TO THE OFFICE FOR PROCESSING.  DO NOT GIVE THEM TO YOUR DOCTOR. ° ° ° ° °WHEN TO CALL US [phone]: °1. Poor pain control °2. Reactions / problems with new medications (rash/itching, nausea, etc)  °3. Fever over 101.5 F (38.5 C) °4.   Inability to urinate °5. Nausea and/or vomiting °6. Worsening swelling or bruising °7. Continued bleeding from incision. °8. Increased pain, redness, or drainage from the incision ° °The clinic staff is available to answer your questions during regular business hours (8:30am-5pm).  Please don’t hesitate to call and ask to speak to one of our nurses for clinical concerns.   A surgeon from [facility] is always on call at the hospitals °  °If you have a medical emergency, go to the nearest emergency room or call 911. °  ° °[facility] °[address], [address], [place], [place]  [address] ? °MAIN: [phone] ? TOLL FREE: [phone] ? °FAX [phone] °[email or web address] ° ° °Post Anesthesia Home Care Instructions ° °Activity: °Get plenty of rest for the remainder of the day. A responsible individual must stay with you for 24 hours following the procedure.  °For the next 24 hours, DO NOT: °-Drive a car °-Operate machinery °-Drink alcoholic beverages °-Take any medication unless instructed by your physician °-Make  any legal decisions or sign important papers. ° °Meals: °Start with liquid foods such as gelatin or soup. Progress to regular foods as tolerated. Avoid greasy, spicy, heavy foods. If nausea and/or vomiting occur, drink only clear liquids until the nausea and/or vomiting subsides. Call your physician if vomiting continues. ° °Special Instructions/Symptoms: °Your throat may feel dry or sore from the anesthesia or the breathing tube placed in your throat during surgery. If this causes discomfort, gargle with warm salt water. The discomfort should disappear within 24 hours. ° °If you had a scopolamine patch placed behind your ear for the management of post- operative nausea and/or vomiting: ° °1. The medication in the patch is effective for 72 hours, after which it should be removed.  Wrap patch in a tissue and discard in the trash. Wash hands thoroughly with soap and water. °2. You may remove the patch earlier than 72 hours if you experience unpleasant side effects which may include dry mouth, dizziness or visual disturbances. °3. Avoid touching the patch. Wash your hands with soap and water after contact with the patch. °  ° ° °

## 2016-10-29 NOTE — Anesthesia Postprocedure Evaluation (Signed)
Anesthesia Post Note  Patient: [name]  Procedure(s) Performed: Procedure(s) (LRB): HEMORRHOIDECTOMY (N/A)     Patient location during evaluation: PACU Anesthesia Type: MAC Level of consciousness: awake and alert Pain management: pain level controlled Vital Signs Assessment: post-procedure vital signs reviewed and stable Respiratory status: spontaneous breathing, nonlabored ventilation, respiratory function stable and patient connected to nasal cannula oxygen Cardiovascular status: stable and blood pressure returned to baseline Anesthetic complications: no    Last Vitals:  Vitals:   10/29/16 1330 10/29/16 1345  BP: 109/66 114/62  Pulse: (!) 39 69  Resp: 11 10  Temp:    SpO2: 92% 94%    Last Pain:  Vitals:   10/29/16 1103  TempSrc: Oral                 [name]

## 2016-10-29 NOTE — Transfer of Care (Signed)
Immediate Anesthesia Transfer of Care Note  Patient: [name]  Procedure(s) Performed: Procedure(s) (LRB): HEMORRHOIDECTOMY (N/A)  Patient Location: PACU  Anesthesia Type: MAC  Level of Consciousness: awake, alert , oriented and patient cooperative  Airway & Oxygen Therapy: Patient Spontanous Breathing and Patient connected to face mask oxygen  Post-op Assessment: Report given to PACU RN and Post -op Vital signs reviewed and stable  Post vital signs: Reviewed and stable  Complications: No apparent anesthesia complications

## 2016-10-30 ENCOUNTER — Encounter (HOSPITAL_BASED_OUTPATIENT_CLINIC_OR_DEPARTMENT_OTHER): Payer: Self-pay | Admitting: General Surgery

## 2016-10-30 ENCOUNTER — Other Ambulatory Visit: Payer: Self-pay

## 2016-10-30 DIAGNOSIS — I83893 Varicose veins of bilateral lower extremities with other complications: Secondary | ICD-10-CM

## 2016-11-04 ENCOUNTER — Encounter: Payer: Self-pay | Admitting: Vascular Surgery

## 2016-11-04 ENCOUNTER — Ambulatory Visit (INDEPENDENT_AMBULATORY_CARE_PROVIDER_SITE_OTHER): Payer: Medicare Other | Admitting: Vascular Surgery

## 2016-11-04 ENCOUNTER — Ambulatory Visit (HOSPITAL_COMMUNITY)
Admission: RE | Admit: 2016-11-04 | Discharge: 2016-11-04 | Disposition: A | Discharge status: Home / Self Care | Payer: Medicare Other | Source: Ambulatory Visit | Attending: Vascular Surgery | Admitting: Vascular Surgery

## 2016-11-04 VITALS — BP 103/60 | HR 92 | Temp 99.1°F | Resp 16 | Ht 64.5 in | Wt 170.3 lb

## 2016-11-04 DIAGNOSIS — I8393 Asymptomatic varicose veins of bilateral lower extremities: Secondary | ICD-10-CM | POA: Diagnosis not present

## 2016-11-04 DIAGNOSIS — I872 Venous insufficiency (chronic) (peripheral): Secondary | ICD-10-CM

## 2016-11-04 DIAGNOSIS — I83893 Varicose veins of bilateral lower extremities with other complications: Secondary | ICD-10-CM | POA: Diagnosis not present

## 2016-11-04 NOTE — Progress Notes (Signed)
Requested by:  Tower, Wynelle Fanny, MD [place], [place] [id number]  Reason for consultation: bilateral leg swelling    History of Present Illness   [name] is a 83 y.o. (Feb 08, 1933) female who presents with chief complaint: bilateral leg swellin.  Patient notes, onset of swelling few months associated with travel abroad.  The patient's symptoms include: bilateral increased leg swelling along with swelling in arm and face.  The patient has had no history of DVT, history of pregnancy, known history of varicose vein, no history of venous stasis ulcers, no history of  Lymphedema and known history of skin changes in lower legs.  There is family history of venous disorders.  The patient has used knee high compression stockings in the past.  Past Medical History:  Diagnosis Date  . Allergic rhinitis   . 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 Arthritis   . Atrial fibrillation -persistent cardiologist-  dr klein/  primary EP -- [facility]aw[facility] Sat ([facility])   a. s/p PVI [name] 2010;  b. on tikosyn/coumadin;  c. 05/2009 Echo: EF 60-65%, Gr 2 DD. (first dx 09/ 2007)  . Bilateral lower extremity edema   . Bleeding hemorrhoid   . Carotid stenosis    mild (hosp 3/11)- consult by vasc/ Dr Donnetta Hutching  . Complication of anesthesia    hard to wake  . Depression   . Diverticulosis of colon   . Fatty liver   . H/O cardiac radiofrequency ablation    01/ 2008 at [facility] [place] /  03/ 2010  at [facility]  . Heart failure with preserved ejection fraction ([place])   . Heart murmur    "prior to valve repair"  . History of adenomatous polyp of colon    tubular adenoma's  . History of cardiomyopathy    secondary tachycardia-induced cardiomyopathy -- resolved 2014  . History of squamous cell carcinoma in situ (SCCIS) of skin    05/ 2017  nasal bridge and right medial knee  . History of transient ischemic attack (TIA)    01-24-2005 and 06-12-2009  . Hyperlipidemia   . Hypothyroidism   . Mild  intermittent asthma    pulmologist-  dr Gwenette Greet  . Mixed stress and urge urinary incontinence   . Pulmonary nodule   . S/P mitral valve repair 10-23-1998  dr Boyce Medici at [facility]   for MVP and regurg. (annuloplasty ring procedure)  . Varicose vein of leg   . Wears glasses     Past Surgical History:  Procedure Laterality Date  . APPENDECTOMY  1978  . CARDIAC ELECTROPHYSIOLOGY Jay AND ABLATION  01/ 2008    at [place]   right-sided ablation atrial flutter  . CARDIAC ELECTROPHYSIOLOGY STUDY AND ABLATION  03/ 2010   dr Jaymes Graff at [facility]   AV node ablation and pulmonary vein isolation for atrial fib  . CARDIOVERSION  06-18-2006;  07-13-2006;  10-19-2010;  10-27-2010  . COLONOSCOPY    . CYSTO/ TRANSURETHRAL COLLAGEN INJECTION THERAPY  07-26-2007   dr Matilde Sprang  . DILATION AND CURETTAGE OF UTERUS    . EXCISIONAL HEMORRHOIDECTOMY  1980s  . HEMORRHOID SURGERY N/A 10/29/2016   Procedure: HEMORRHOIDECTOMY;  Surgeon: Leighton Ruff, MD;  Location: [facility];  Service: General;  Laterality: N/A;  . MITRAL VALVE ANNULOPLASTY  10/23/1998   "Model 4625; [employer] Lerner [id number]"; size 34mm; [facility] [place]; Dr. Boyce Medici  . PILONIDAL CYST EXCISION  1954  . TEE WITH CARDIOVERSION  05-06-2006 at  [facility];  01-02-2013 at [facility]  . TRANSTHORACIC ECHOCARDIOGRAM  05-01-2015   dr Caryl Comes   ef 50-55%/  mild AV sclerosis without stenosis/  post MV repair with mild central MR (valve area by pressure half-time 2cm^2,  valve area by continutity equation 0.91cm^2, peak grandiant 27mmHg)/  severe LAE/ mild TR/ mild RAE   . TUBAL LIGATION Bilateral 1978    Social History   Social History  . Marital status: Widowed    Spouse name: N/A  . Number of children: 6  . Years of education: N/A   Occupational History  . realtor Retired   Social History Main Topics  . Smoking status: Never Smoker  . Smokeless tobacco: Never Used  . Alcohol use 0.0 oz/week     Comment: seldom  . Drug use: No   . Sexual activity: No   Other Topics Concern  . Not on file   Social History Narrative   Retired. Daily Caffeine use: 2 daily     Family History  Problem Relation Age of Onset  . Hypertension Mother   . Lung cancer Father        smoker  . Alcohol abuse Father   . Cancer Father        bladder and lung CA smoker  . Breast cancer Neg Hx     Current Outpatient Prescriptions  Medication Sig Dispense Refill  . albuterol (PROVENTIL HFA;VENTOLIN HFA) 108 (90 Base) MCG/ACT inhaler Inhale 2 puffs into the lungs every 4 (four) hours as needed for wheezing or shortness of breath. 1 Inhaler 0  . benzonatate (TESSALON) 200 MG capsule Take 1 capsule (200 mg total) by mouth 3 (three) times daily as needed for cough. Swallow whole, do not bite pill 30 capsule 2  . CARTIA XT 180 MG 24 hr capsule TAKE 1 CAPSULE (180 MG TOTAL) BY MOUTH DAILY. (Patient taking differently: Take 180 mg by mouth every morning. ) 90 capsule 2  . cholecalciferol (VITAMIN D) 1000 units tablet Take 1,000 Units by mouth daily.    . cyanocobalamin 1000 MCG tablet Take 1,000 mcg by mouth daily.    . furosemide (LASIX) 40 MG tablet TAKE 1/2 TABLET BY MOUTH DAILY, MAY INCREASE TO 1 TABLET BY MOUTH DAILY IF NEEDED (Patient taking differently: TAKE 1/2 TABLET BY MOUTH DAILY, MAY INCREASE TO 1 TABLET BY MOUTH DAILY IF NEEDED  for edema) 90 tablet 2  . levothyroxine (SYNTHROID, LEVOTHROID) 25 MCG tablet TAKE 1 TABLET EVERY DAY BEFORE BREAKFAST 90 tablet 1  . loratadine (CLARITIN) 10 MG tablet Take 10 mg by mouth daily as needed for rhinitis.     [name] oxyCODONE (OXY IR/ROXICODONE) 5 MG immediate release tablet Take 1-2 tablets (5-10 mg total) by mouth every 6 (six) hours as needed. 30 tablet 0  . warfarin (COUMADIN) 5 MG tablet TAKE AS DIRECTED BY ANTI COAGULATION CLINIC 90 tablet 0   No current facility-administered medications for this visit.     Allergies  Allergen Reactions  . Amiodarone Hcl Swelling  . Penicillins Rash  .  Statins Rash    REVIEW OF SYSTEMS (negative unless checked):   Cardiac:  []  Chest pain or chest pressure? [x]  Shortness of breath upon activity? []  Shortness of breath when lying flat? [x]  Irregular heart rhythm?  Vascular:  [x]  Pain in calf, thigh, or hip brought on by walking? []  Pain in feet at night that wakes you up from your sleep? []  Blood clot in your veins? [x]  Leg swelling?  Pulmonary:  []   Oxygen at home? []  Productive cough? []  Wheezing?  Neurologic:  []  Sudden weakness in arms or legs? []  Sudden numbness in arms or legs? []  Sudden onset of difficult speaking or slurred speech? []  Temporary loss of vision in one eye? []  Problems with dizziness?  Gastrointestinal:  []  Blood in stool? []  Vomited blood?  Genitourinary:  []  Burning when urinating? []  Blood in urine?  Psychiatric:  []  Major depression  Hematologic:  []  Bleeding problems? []  Problems with blood clotting?  Dermatologic:  []  Rashes or ulcers?  Constitutional:  []  Fever or chills?  Ear/Nose/Throat:  []  Change in hearing? []  Nose bleeds? []  Sore throat?  Musculoskeletal:  []  Back pain? []  Joint pain? []  Muscle pain?   Physical Examination     Vitals:   11/04/16 1501  BP: 103/60  Pulse: 92  Resp: 16  Temp: 99.1 F (37.3 C)  TempSrc: Oral  SpO2: 95%  Weight: 170 lb 4.8 oz (77.2 kg)  Height: 5' 4.5" (1.638 m)   Body mass index is 28.78 kg/m.  General alert, O x 3, WD, NAD  Head [place]/AT,    Ear/Nose/ Throat Hearing grossly intact, nares without erythema or drainage, oropharynx without Erythema or Exudate, Mallampati score: 3,   Eyes PERRLA, EOMI,    Neck Supple, mid-line trachea,    Pulmonary Sym exp, good B air movt, CTA B  Cardiac RRR, Nl S1, S2, no Murmurs, No rubs, No S3,S4  Vascular Vessel Right Left  Radial Palpable Palpable  Brachial Palpable Palpable  Carotid Palpable, No Bruit Palpable, No Bruit  Aorta Not palpable N/A  Femoral Palpable Palpable    Popliteal Not palpable Not palpable  PT Palpable Palpable  DP Palpable Palpable    Gastro- intestinal soft, non-distended, non-tender to palpation, No guarding or rebound, no HSM, no masses, no CVAT B, No palpable prominent aortic pulse,    Musculo- skeletal M/S 5/5 throughout  , Extremities without ischemic changes  , Non-pitting edema present: B 1-2+, Varicosities present: L>R, extensive spider veins, Lipodermatosclerosis present: mild amount of R LDS  Neurologic Cranial nerves 2-12 intact , Pain and light touch intact in extremities , Motor exam as listed above  Psychiatric Judgement intact, Mood & affect appropriate for pt's clinical situation  Dermatologic See M/S exam for extremity exam, No rashes otherwise noted  Lymphatic  Palpable lymph nodes: None    Non-invasive Vascular Imaging   BLE Venous Insufficiency Duplex (11/04/2016):   RLE:   no DVT and SVT,   + GSV reflux: 2.2-3.5 mm  no SSV reflux,  + deep venous reflux: CFV  LLE:  no DVT and SVT,   + GSV reflux: 2.5-3.0 mm  +  SSV reflux: 2.3-2.7 mm  + deep venous reflux: CFV   Medical Decision Making   ERMEL VERNE is a 83 y.o. female who presents with: BLE chronic venous insufficiency (C2): segment valvular incompetency, B spider veins   The patient's venous disease does not need EVLA GSV on either side.  Pt still would like to be evaluated for sclerotherapy.  Pt is aware this likely consider cosmetic in nature.  I will make arrangement for her to be seen by Dr. Kellie Simmering or Dr. Donnetta Hutching.  Thank you for allowing [name] to participate in this patient's care.   Adele Barthel, MD, FACS [facility] Office: [phone] Pager: [phone]  11/04/2016, 4:31 PM

## 2016-11-09 ENCOUNTER — Ambulatory Visit: Payer: Medicare Other | Admitting: Family Medicine

## 2016-11-10 ENCOUNTER — Ambulatory Visit: Payer: Medicare Other

## 2016-11-11 ENCOUNTER — Telehealth: Payer: Self-pay | Admitting: Family Medicine

## 2016-11-11 NOTE — Telephone Encounter (Signed)
Please let her know that I cannot see her on Thursday next week.  She can be rescheduled for either 11/24/16 or 11/27/16.  Thank you for taking care of this.

## 2016-11-11 NOTE — Telephone Encounter (Signed)
[name] states that she can not reschedule for either Fri or Tues next week. The only day she can do is next thurs. Will this work for you, I know coumadin clinic is not normally on Thurs

## 2016-11-12 DIAGNOSIS — H903 Sensorineural hearing loss, bilateral: Secondary | ICD-10-CM | POA: Diagnosis not present

## 2016-11-13 ENCOUNTER — Encounter: Payer: Self-pay | Admitting: Surgery

## 2016-11-16 ENCOUNTER — Encounter: Payer: Self-pay | Admitting: Vascular Surgery

## 2016-11-16 ENCOUNTER — Ambulatory Visit (INDEPENDENT_AMBULATORY_CARE_PROVIDER_SITE_OTHER): Payer: Medicare Other | Admitting: Vascular Surgery

## 2016-11-16 VITALS — BP 122/72 | HR 107 | Temp 97.9°F | Resp 14 | Ht 65.0 in | Wt 172.0 lb

## 2016-11-16 DIAGNOSIS — I83893 Varicose veins of bilateral lower extremities with other complications: Secondary | ICD-10-CM | POA: Diagnosis not present

## 2016-11-16 NOTE — Progress Notes (Signed)
Subjective:     Patient ID: [name], female   DOB: 1933/02/11, 83 y.o.   MRN: [id number]  HPI This 83 year old female is evaluated for bilateral varicose veins. She was seen by Dr. Geryl Councilman a few weeks ago and found to have no reflux in the great or small saphenous veins and they were small caliber. She has primarily spider veins and she would like sclerotherapy performed. She has no history of DVT thrombophlebitis stasis ulcers or bleeding. She does occasionally develops swelling as the day progresses. She does not were elastic compression stockings.  Past Medical History:  Diagnosis Date  . Allergic rhinitis   . 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 Arthritis   . Atrial fibrillation -persistent cardiologist-  dr klein/  primary EP -- [facility] Tawanna Sat ([facility])   a. s/p PVI [name] 2010;  b. on tikosyn/coumadin;  c. 05/2009 Echo: EF 60-65%, Gr 2 DD. (first dx 09/ 2007)  . Bilateral lower extremity edema   . Bleeding hemorrhoid   . Carotid stenosis    mild (hosp 3/11)- consult by vasc/ Dr Donnetta Hutching  . Complication of anesthesia    hard to wake  . Depression   . Diverticulosis of colon   . Fatty liver   . H/O cardiac radiofrequency ablation    01/ 2008 at [facility] [place] /  03/ 2010  at [facility]  . Heart failure with preserved ejection fraction ([place])   . Heart murmur    "prior to valve repair"  . History of adenomatous polyp of colon    tubular adenoma's  . History of cardiomyopathy    secondary tachycardia-induced cardiomyopathy -- resolved 2014  . History of squamous cell carcinoma in situ (SCCIS) of skin    05/ 2017  nasal bridge and right medial knee  . History of transient ischemic attack (TIA)    01-24-2005 and 06-12-2009  . Hyperlipidemia   . Hypothyroidism   . Mild intermittent asthma    pulmologist-  dr Gwenette Greet  . Mixed stress and urge urinary incontinence   . Pulmonary nodule   . S/P mitral valve repair 10-23-1998  dr Boyce Medici at [facility]   for MVP and regurg.  (annuloplasty ring procedure)  . Varicose vein of leg   . Wears glasses     Social History  Substance Use Topics  . Smoking status: Never Smoker  . Smokeless tobacco: Never Used  . Alcohol use 0.0 oz/week     Comment: seldom    Family History  Problem Relation Age of Onset  . Hypertension Mother   . Lung cancer Father        smoker  . Alcohol abuse Father   . Cancer Father        bladder and lung CA smoker  . Breast cancer Neg Hx     Allergies  Allergen Reactions  . Amiodarone Hcl Swelling  . Penicillins Rash  . Statins Rash     Current Outpatient Prescriptions:  .  albuterol (PROVENTIL HFA;VENTOLIN HFA) 108 (90 Base) MCG/ACT inhaler, Inhale 2 puffs into the lungs every 4 (four) hours as needed for wheezing or shortness of breath., Disp: 1 Inhaler, Rfl: 0 .  benzonatate (TESSALON) 200 MG capsule, Take 1 capsule (200 mg total) by mouth 3 (three) times daily as needed for cough. Swallow whole, do not bite pill, Disp: 30 capsule, Rfl: 2 .  CARTIA XT 180 MG 24 hr capsule, TAKE 1 CAPSULE (180 MG TOTAL) BY MOUTH DAILY. (Patient taking differently:  Take 180 mg by mouth every morning. ), Disp: 90 capsule, Rfl: 2 .  cholecalciferol (VITAMIN D) 1000 units tablet, Take 1,000 Units by mouth daily., Disp: , Rfl:  .  cyanocobalamin 1000 MCG tablet, Take 1,000 mcg by mouth daily., Disp: , Rfl:  .  furosemide (LASIX) 40 MG tablet, TAKE 1/2 TABLET BY MOUTH DAILY, MAY INCREASE TO 1 TABLET BY MOUTH DAILY IF NEEDED (Patient taking differently: TAKE 1/2 TABLET BY MOUTH DAILY, MAY INCREASE TO 1 TABLET BY MOUTH DAILY IF NEEDED  for edema), Disp: 90 tablet, Rfl: 2 .  levothyroxine (SYNTHROID, LEVOTHROID) 25 MCG tablet, TAKE 1 TABLET EVERY DAY BEFORE BREAKFAST, Disp: 90 tablet, Rfl: 1 .  loratadine (CLARITIN) 10 MG tablet, Take 10 mg by mouth daily as needed for rhinitis. , Disp: , Rfl:  .  oxyCODONE (OXY IR/ROXICODONE) 5 MG immediate release tablet, Take 1-2 tablets (5-10 mg total) by mouth every 6  (six) hours as needed., Disp: 30 tablet, Rfl: 0 .  warfarin (COUMADIN) 5 MG tablet, TAKE AS DIRECTED BY ANTI COAGULATION CLINIC, Disp: 90 tablet, Rfl: 0  Vitals:   11/16/16 1423  BP: 122/72  Pulse: (!) 107  Resp: 14  Temp: 97.9 F (36.6 C)  SpO2: 98%  Weight: 172 lb (78 kg)  Height: 5\' 5"  (1.651 m)    Body mass index is 28.62 kg/m.          Review of Systems  denies chest pain, dyspnea on exertion, PND, orthopnea, hemoptysis. Has history of atrial fibrillation. Objective:   Physical Exam BP 122/72 (BP Location: Left Arm, Patient Position: Sitting, Cuff Size: Normal)   Pulse (!) 107   Temp 97.9 F (36.6 C)   Resp 14   Ht 5\' 5"  (1.651 m)   Wt 172 lb (78 kg)   SpO2 98%   BMI 28.62 kg/m   Gen. well-developed well-nourished female no apparent distress alert and oriented 3 Lungs no rhonchi or wheezing Both legs with diffuse spider veins in the medial and lateral thighs and medial lateral AND some reticular veins dorsum of both feet in the medial malleolar area       Assessment:     Bilateral spider veins-asymptomatic    Plan:     Have given patient contact information [place] to discuss foam sclerotherapy which she would like to have performed

## 2016-11-18 NOTE — Telephone Encounter (Signed)
L/m to r/s appt  °

## 2016-11-20 ENCOUNTER — Encounter: Payer: Self-pay | Admitting: Internal Medicine

## 2016-11-20 ENCOUNTER — Ambulatory Visit (INDEPENDENT_AMBULATORY_CARE_PROVIDER_SITE_OTHER): Payer: Medicare Other | Admitting: Internal Medicine

## 2016-11-20 ENCOUNTER — Ambulatory Visit (INDEPENDENT_AMBULATORY_CARE_PROVIDER_SITE_OTHER): Payer: Medicare Other | Admitting: Family Medicine

## 2016-11-20 ENCOUNTER — Encounter: Payer: Self-pay | Admitting: Family Medicine

## 2016-11-20 VITALS — BP 112/74 | HR 98 | Ht 64.0 in | Wt 175.0 lb

## 2016-11-20 VITALS — BP 120/64 | HR 128 | Temp 98.1°F | Ht 65.0 in | Wt 174.8 lb

## 2016-11-20 DIAGNOSIS — E039 Hypothyroidism, unspecified: Secondary | ICD-10-CM | POA: Diagnosis not present

## 2016-11-20 DIAGNOSIS — H6121 Impacted cerumen, right ear: Secondary | ICD-10-CM | POA: Diagnosis not present

## 2016-11-20 DIAGNOSIS — M8589 Other specified disorders of bone density and structure, multiple sites: Secondary | ICD-10-CM | POA: Diagnosis not present

## 2016-11-20 DIAGNOSIS — I4821 Permanent atrial fibrillation: Secondary | ICD-10-CM

## 2016-11-20 DIAGNOSIS — I509 Heart failure, unspecified: Secondary | ICD-10-CM

## 2016-11-20 DIAGNOSIS — I482 Chronic atrial fibrillation: Secondary | ICD-10-CM

## 2016-11-20 DIAGNOSIS — I10 Essential (primary) hypertension: Secondary | ICD-10-CM | POA: Diagnosis not present

## 2016-11-20 LAB — TSH: TSH: 3.23 u[IU]/mL (ref 0.35–4.50)

## 2016-11-20 MED ORDER — ALENDRONATE SODIUM 70 MG PO TABS: 70.0000 mg | ORAL_TABLET | ORAL | 11 refills | Status: DC

## 2016-11-20 NOTE — Progress Notes (Signed)
Electrophysiology Office Note   Date:  11/20/2016   ID:  [name], [name] 1932-08-27, MRN [id number]  PCP:  [name] Greenspan, MD  Cardiologist:   Primary Electrophysiologist:  [name] Axe, MD    No chief complaint on file.    History of Present Illness: [name] is a 83 y.o. female seen in followup electrophysiology evaluation for atrial fibrillation.    She has hx of  atrial flutter ablation related to prior atriotomy undertaken at [facility] in March 4098 complicated by recurrent atrial arrhythmias prompting initiation of Tikosyn  This was stopped fall 2012 Recurrent symptoms prompted [name] to reinitiate Tikosyn.  Shes had recurrent episodes of atrial fibrillation associated with a rapid ventricular response.   She underwent cardioversion and reverted again to atrial fibrillation; it is now permanent She has decided to remain on warfarin  Her ejection fraction initially was about 35-40% and repeat assessment 10/14  demonstrated 55-60% with improved rate control following the initiation of digoxin.   Repeat he has assessment 2 /17 EF 50-55% with biatrial enlargement.   She had lost weight on a low-carb diet; however, she recently went is revealed where she found that she rapidly accumulated weight and edema. Since returning home the edema has abated somewhat, she remains somewhat fatigued  She denies nocturnal dyspnea; she has some dyspnea on exertion. She has noticed some swelling on her face with her eyeglasses.     Past Medical History:  Diagnosis Date  . Allergic rhinitis   . 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 Arthritis   . Atrial fibrillation -persistent cardiologist-  [name]/  primary EP -- [name] [name] ([facility])   a. s/p PVI [name] 2010;  b. on tikosyn/coumadin;  c. 05/2009 Echo: EF 60-65%, Gr 2 DD. (first dx 09/ 2007)  . Bilateral lower extremity edema   . Bleeding hemorrhoid   . Carotid stenosis    mild (hosp 3/11)- consult by vasc/ [name] [name]  . Complication of anesthesia     hard to wake  . Depression   . Diverticulosis of colon   . Fatty liver   . H/O cardiac radiofrequency ablation    01/ 2008 at [facility] /  03/ 2010  at [facility]  . Heart failure with preserved ejection fraction ([name])   . Heart murmur    "prior to valve repair"  . History of adenomatous polyp of colon    tubular adenoma's  . History of cardiomyopathy    secondary tachycardia-induced cardiomyopathy -- resolved 2014  . History of squamous cell carcinoma in situ (SCCIS) of skin    05/ 2017  nasal bridge and right medial knee  . History of transient ischemic attack (TIA)    01-24-2005 and 06-12-2009  . Hyperlipidemia   . Hypothyroidism   . Mild intermittent asthma    pulmologist-  [name] [name]  . Mixed stress and urge urinary incontinence   . Pulmonary nodule   . S/P mitral valve repair 10-23-1998  [name] [name] at [facility]   for MVP and regurg. (annuloplasty ring procedure)  . Varicose vein of leg   . Wears glasses    Past Surgical History:  Procedure Laterality Date  . APPENDECTOMY  1978  . CARDIAC ELECTROPHYSIOLOGY [place] AND ABLATION  01/ 2008    at [place]   right-sided ablation atrial flutter  . CARDIAC ELECTROPHYSIOLOGY STUDY AND ABLATION  03/ 2010   [name] Jaymes Graff at [facility] node ablation and  pulmonary vein isolation for atrial fib  . CARDIOVERSION  06-18-2006;  07-13-2006;  10-19-2010;  10-27-2010  . COLONOSCOPY    . CYSTO/ TRANSURETHRAL COLLAGEN INJECTION THERAPY  07-26-2007   [name] Matilde Sprang  . DILATION AND CURETTAGE OF UTERUS    . EXCISIONAL HEMORRHOIDECTOMY  1980s  . HEMORRHOID SURGERY N/A 10/29/2016   Procedure: HEMORRHOIDECTOMY;  Surgeon: [name] Ruff, MD;  Location: [facility];  Service: General;  Laterality: N/A;  . MITRAL VALVE ANNULOPLASTY  10/23/1998   "Model 4625; Campbell Lerner [id number]"; size 35mm; [facility]; [name]. Boyce Key  . PILONIDAL CYST EXCISION  1954  . TEE WITH CARDIOVERSION  05-06-2006 at [facility];  01-02-2013  at [facility]  . TRANSTHORACIC ECHOCARDIOGRAM  05-01-2015   [name] Caryl Comes   ef 50-55%/  mild AV sclerosis without stenosis/  post MV repair with mild central MR (valve area by pressure half-time 2cm^2,  valve area by continutity equation 0.91cm^2, peak grandiant 32mmHg)/  severe LAE/ mild TR/ mild RAE   . TUBAL LIGATION Bilateral 1978     Current Outpatient Prescriptions  Medication Sig Dispense Refill  . albuterol (PROVENTIL HFA;VENTOLIN HFA) 108 (90 Base) MCG/ACT inhaler Inhale 2 puffs into the lungs every 4 (four) hours as needed for wheezing or shortness of breath. 1 Inhaler 0  . alendronate (FOSAMAX) 70 MG tablet Take 1 tablet (70 mg total) by mouth every 7 (seven) days. Take with a full glass of water on an empty stomach. 4 tablet 11  . benzonatate (TESSALON) 200 MG capsule Take 1 capsule (200 mg total) by mouth 3 (three) times daily as needed for cough. Swallow whole, do not bite pill 30 capsule 2  . CARTIA XT 180 MG 24 hr capsule TAKE 1 CAPSULE (180 MG TOTAL) BY MOUTH DAILY. (Patient taking differently: Take 180 mg by mouth every morning. ) 90 capsule 2  . cholecalciferol (VITAMIN D) 1000 units tablet Take 1,000 Units by mouth daily.    . cyanocobalamin 1000 MCG tablet Take 1,000 mcg by mouth daily.    . furosemide (LASIX) 40 MG tablet TAKE 1/2 TABLET BY MOUTH DAILY, MAY INCREASE TO 1 TABLET BY MOUTH DAILY IF NEEDED (Patient taking differently: TAKE 1/2 TABLET BY MOUTH DAILY, MAY INCREASE TO 1 TABLET BY MOUTH DAILY IF NEEDED  for edema) 90 tablet 2  . levothyroxine (SYNTHROID, LEVOTHROID) 25 MCG tablet TAKE 1 TABLET EVERY DAY BEFORE BREAKFAST 90 tablet 1  . loratadine (CLARITIN) 10 MG tablet Take 10 mg by mouth daily as needed for rhinitis.     [name] warfarin (COUMADIN) 5 MG tablet TAKE AS DIRECTED BY ANTI COAGULATION CLINIC 90 tablet 0   No current facility-administered medications for this visit.     Allergies:   Amiodarone hcl; Penicillins; and Statins   Social History:  The patient  reports that  she has never smoked. She has never used smokeless tobacco. She reports that she drinks alcohol. She reports that she does not use drugs.   Family History:  The patient's family history includes Alcohol abuse in her father; Cancer in her father; Hypertension in her mother; Lung cancer in her father.    ROS:  Please see the history of present illness.  .   All other systems are reviewed and negative.    PHYSICAL EXAM: VS:  BP 112/74   Pulse 98   Ht 5\' 4"  (1.626 m)   Wt 175 lb (79.4 kg)   SpO2 96%   BMI 30.04 kg/m  , BMI Body mass index is 30.04  kg/m. Well developed and nourished in no acute distress HENT normal Neck supple with JVP-8-10 +HJR Carotids brisk and full without bruits Clear Irregularly irregular rate and rhythm, no murmurs or gallops Abd-soft with active BS without hepatomegaly No Clubbing cyanosis 1+edema Skin-warm and dry A & Oriented  Grossly normal sensory and motor function   EKG:  atrial fibrillation at  86 /.10/38     Recent Labs: 08/26/2016: ALT 14; Platelets 168.0; Pro B Natriuretic peptide (BNP) 98.0 10/29/2016: BUN 21; Creatinine, Ser 0.70; Hemoglobin 12.2; Potassium 3.7; Sodium 142 11/20/2016: TSH 3.23    Lipid Panel     Component Value Date/Time   CHOL 246 (H) 08/26/2016 1622   TRIG 95.0 08/26/2016 1622   HDL 49.70 08/26/2016 1622   CHOLHDL 5 08/26/2016 1622   VLDL 19.0 08/26/2016 1622   LDLCALC 178 (H) 08/26/2016 1622   LDLDIRECT 203.8 01/09/2008 0934     Wt Readings from Last 3 Encounters:  11/20/16 175 lb (79.4 kg)  11/20/16 174 lb 12 oz (79.3 kg)  11/16/16 172 lb (78 kg)      Other studies Reviewed: Additional studies/ records that were reviewed today include: labs as noted  Review of the above records today demonstrates:    ASSESSMENT AND PLAN: Atrial fibrillation-permanent  Hypothyroidism-treated  TSH normal a few weeks ago  High risk medications  Dypsnea on exertion --   She is volume overloaded. This is a change from  her prior examination. Review of her echo 2017 had demonstrated no significant TR although she had severe RAE. Interestingly, BNP was normal about 6 weeks ago; not withstanding, I think this is volume overloaded. Her dietary fluid intake isn't normal; we will decrease it from 4 L--3 L daily rather than making any medication adjustments initially.  We will plan to repeat an ultrasound to look for evidence of worsening cardiomyopathy. Her resting heart rates have been 80s/90s, probably not high enough to cause cardiomyopathy.  Tolerating anticoagulation without bleeding    Current medicines are reviewed at length with the patient today.   The patient does not have concerns regarding her medicines.  The following changes were made today:  none  Labs/ tests ordered today include:    No orders of the defined types were placed in this encounter.    Disposition:   *33months   Signed, [name] Axe, MD  11/20/2016 3:38 PM     [address] [place] [address] [phone] (office) [phone] (fax)

## 2016-11-20 NOTE — Assessment & Plan Note (Signed)
dexa 7/18 Worse in FN  No falls or fx Rev frax risk data  Disc opt for tx -would start with alendronate  Info given on med as well as px  If successful- would tx for 5 years  Disc poss side eff - esophageal symptoms and low risk of jaw tumor (hold for dental work)  dexa 2 y  Exercise /vit D enc

## 2016-11-20 NOTE — Assessment & Plan Note (Signed)
Lab Results  Component Value Date   TSH 3.06 08/26/2016   Pt feels keyed up /anx and her pulse rate is up  Re check this today  Taking the levothyroxine appropriately

## 2016-11-20 NOTE — Assessment & Plan Note (Signed)
Simple irrigation done today

## 2016-11-20 NOTE — Patient Instructions (Addendum)
Let's check thyroid again today in light of your symptoms   You are a candidate for osteoporosis therapy to reduce fracture risk  Please read about alendronate and fill px if you want to try it   Ears flushed today   Your pulse rate is up  See Dr Caryl Comes as planned

## 2016-11-20 NOTE — Progress Notes (Signed)
Subjective:    Patient ID: [name], female    DOB: May 08, 1932, 83 y.o.   MRN: [id number]  HPI Here for f/u of chronic medical problems   Feels tired and jittery/anxious this am and does not know why   Eating well  Eating low carbs (does eat fruit and vegetables)  Still working hard on wt loss  No sweets Does drink 2 cups of coffee daily   Wants to get down to 155  Goes to water aerobics and plans to go through the winter-helps her hip also   She has taken a little otc K for joint/muscle pain  Lab Results  Component Value Date   CREATININE 0.70 10/29/2016   BUN 21 (H) 10/29/2016   NA 142 10/29/2016   K 3.7 10/29/2016   CL 105 10/29/2016   CO2 31 08/26/2016    Lab Results  Component Value Date   WBC 6.1 08/26/2016   HGB 12.2 10/29/2016   HCT 36.0 10/29/2016   MCV 84.2 08/26/2016   PLT 168.0 08/26/2016      dexa - osteopenia  LS T score -1.1 (stable) FN T score -2.0 (down from 1.4) Takes vitamin D (not calcium)  Is exercising  Lost a little ht  No falls  No adult fractures   frax for maj OP fx is 15.5% over the next 10 y and hip 4.7%  She has hypothyroidism  Lab Results  Component Value Date   TSH 3.06 08/26/2016    She is interested in re checking this   Also having ears flushed / cerumen  Being fitted for hearing aides    Pulse Readings from Last 3 Encounters:  11/20/16 (!) 128  11/16/16 (!) 107  11/04/16 92  known a fib  On warfarin Rate control with cartia - she sees Dr Caryl Comes this afternoon  Having more pedal edema lately (and wt is higher than she wants it)   Lab Results  Component Value Date   INR 2.6 10/13/2016   INR 2.0 09/15/2016   INR 1.8 08/18/2016     BP Readings from Last 3 Encounters:  11/20/16 120/64  11/16/16 122/72  11/04/16 103/60   Wt Readings from Last 3 Encounters:  11/20/16 174 lb 12 oz (79.3 kg)  11/16/16 172 lb (78 kg)  11/04/16 170 lb 4.8 oz (77.2 kg)   Patient Active Problem List   Diagnosis Date  Noted  . Varicose veins of bilateral lower extremities with other complications 62/13/0865  . Osteopenia 10/25/2016  . Pedal edema 08/26/2016  . Varicose veins of both lower extremities 08/26/2016  . Estrogen deficiency 08/26/2016  . Screening mammogram, encounter for 08/26/2016  . Hemorrhoids 08/26/2016  . Epistaxis 01/07/2016  . Urticaria 08/22/2014  . Hip pain 08/02/2014  . Left knee pain 08/02/2014  . Chronic cough 05/08/2014  . Blood in stool 04/09/2014  . Caregiver stress 08/16/2013  . Colon cancer screening 08/16/2013  . Encounter for therapeutic drug monitoring 04/20/2013  . Left ovarian cyst 03/14/2013  . (HFpEF) heart failure with preserved ejection fraction ([place]) 12/27/2012  . Cardiomyopathy, secondary --Resolved again 10/14 10/13/2010  . COLONIC POLYPS, ADENOMATOUS, HX OF 09/18/2009  . PULMONARY NODULE 12/20/2008  . GANGLION CYST 10/04/2007  . Hyperlipidemia 04/27/2007  . DEPRESSION 04/27/2007  . Asthma, mild intermittent 04/27/2007  . INSOMNIA 04/27/2007  . ADENOMATOUS COLONIC POLYP 11/04/2006  . Hypothyroidism 09/02/2006  . Atrial fibrillation ([place]) 08/05/2006   Past Medical History:  Diagnosis Date  . Allergic rhinitis   .  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 Arthritis   . Atrial fibrillation -persistent cardiologist-  dr klein/  primary EP -- dr Tawanna Sat ([facility])   a. s/p PVI [name] 2010;  b. on tikosyn/coumadin;  c. 05/2009 Echo: EF 60-65%, Gr 2 DD. (first dx 09/ 2007)  . Bilateral lower extremity edema   . Bleeding hemorrhoid   . Carotid stenosis    mild (hosp 3/11)- consult by vasc/ Dr Donnetta Hutching  . Complication of anesthesia    hard to wake  . Depression   . Diverticulosis of colon   . Fatty liver   . H/O cardiac radiofrequency ablation    01/ 2008 at [facility] [place] /  03/ 2010  at [facility]  . Heart failure with preserved ejection fraction ([place])   . Heart murmur    "prior to valve repair"  . History of adenomatous polyp of colon    tubular adenoma's   . History of cardiomyopathy    secondary tachycardia-induced cardiomyopathy -- resolved 2014  . History of squamous cell carcinoma in situ (SCCIS) of skin    05/ 2017  nasal bridge and right medial knee  . History of transient ischemic attack (TIA)    01-24-2005 and 06-12-2009  . Hyperlipidemia   . Hypothyroidism   . Mild intermittent asthma    pulmologist-  dr Gwenette Greet  . Mixed stress and urge urinary incontinence   . Pulmonary nodule   . S/P mitral valve repair 10-23-1998  dr Boyce Medici at [facility]   for MVP and regurg. (annuloplasty ring procedure)  . Varicose vein of leg   . Wears glasses    Past Surgical History:  Procedure Laterality Date  . APPENDECTOMY  1978  . CARDIAC ELECTROPHYSIOLOGY [place] AND ABLATION  01/ 2008    at [place]   right-sided ablation atrial flutter  . CARDIAC ELECTROPHYSIOLOGY STUDY AND ABLATION  03/ 2010   dr Jaymes Graff at [facility]   AV node ablation and pulmonary vein isolation for atrial fib  . CARDIOVERSION  06-18-2006;  07-13-2006;  10-19-2010;  10-27-2010  . COLONOSCOPY    . CYSTO/ TRANSURETHRAL COLLAGEN INJECTION THERAPY  07-26-2007   dr Matilde Sprang  . DILATION AND CURETTAGE OF UTERUS    . EXCISIONAL HEMORRHOIDECTOMY  1980s  . HEMORRHOID SURGERY N/A 10/29/2016   Procedure: HEMORRHOIDECTOMY;  Surgeon: Leighton Ruff, MD;  Location: [facility];  Service: General;  Laterality: N/A;  . MITRAL VALVE ANNULOPLASTY  10/23/1998   "Model 4625; Campbell Lerner [id number]"; size 35mm; [facility]; Dr. Boyce Medici  . PILONIDAL CYST EXCISION  1954  . TEE WITH CARDIOVERSION  05-06-2006 at [facility];  01-02-2013 at [facility]  . TRANSTHORACIC ECHOCARDIOGRAM  05-01-2015   dr Caryl Comes   ef 50-55%/  mild AV sclerosis without stenosis/  post MV repair with mild central MR (valve area by pressure half-time 2cm^2,  valve area by continutity equation 0.91cm^2, peak grandiant 95mmHg)/  severe LAE/ mild TR/ mild RAE   . TUBAL LIGATION Bilateral 1978   Social History    Substance Use Topics  . Smoking status: Never Smoker  . Smokeless tobacco: Never Used  . Alcohol use 0.0 oz/week     Comment: seldom   Family History  Problem Relation Age of Onset  . Hypertension Mother   . Lung cancer Father        smoker  . Alcohol abuse Father   . Cancer Father        bladder and lung CA smoker  . Breast  cancer Neg Hx    Allergies  Allergen Reactions  . Amiodarone Hcl Swelling  . Penicillins Rash  . Statins Rash   Current Outpatient Prescriptions on File Prior to Visit  Medication Sig Dispense Refill  . albuterol (PROVENTIL HFA;VENTOLIN HFA) 108 (90 Base) MCG/ACT inhaler Inhale 2 puffs into the lungs every 4 (four) hours as needed for wheezing or shortness of breath. 1 Inhaler 0  . benzonatate (TESSALON) 200 MG capsule Take 1 capsule (200 mg total) by mouth 3 (three) times daily as needed for cough. Swallow whole, do not bite pill 30 capsule 2  . CARTIA XT 180 MG 24 hr capsule TAKE 1 CAPSULE (180 MG TOTAL) BY MOUTH DAILY. (Patient taking differently: Take 180 mg by mouth every morning. ) 90 capsule 2  . cholecalciferol (VITAMIN D) 1000 units tablet Take 1,000 Units by mouth daily.    . cyanocobalamin 1000 MCG tablet Take 1,000 mcg by mouth daily.    . furosemide (LASIX) 40 MG tablet TAKE 1/2 TABLET BY MOUTH DAILY, MAY INCREASE TO 1 TABLET BY MOUTH DAILY IF NEEDED (Patient taking differently: TAKE 1/2 TABLET BY MOUTH DAILY, MAY INCREASE TO 1 TABLET BY MOUTH DAILY IF NEEDED  for edema) 90 tablet 2  . levothyroxine (SYNTHROID, LEVOTHROID) 25 MCG tablet TAKE 1 TABLET EVERY DAY BEFORE BREAKFAST 90 tablet 1  . loratadine (CLARITIN) 10 MG tablet Take 10 mg by mouth daily as needed for rhinitis.     [name] warfarin (COUMADIN) 5 MG tablet TAKE AS DIRECTED BY ANTI COAGULATION CLINIC 90 tablet 0   No current facility-administered medications on file prior to visit.     Review of Systems Review of Systems  Constitutional: Negative for fever, appetite change, fatigue and  unexpected weight change.  Eyes: Negative for pain and visual disturbance.  ENT pos for wax in R ear  Respiratory: Negative for cough and shortness of breath.   Cardiovascular: Negative for cp or palpitations   pos for elevated HR today Gastrointestinal: Negative for nausea, diarrhea and constipation.  Genitourinary: Negative for urgency and frequency.  Skin: Negative for pallor or rash   Neurological: Negative for weakness, light-headedness, numbness and headaches.  Hematological: Negative for adenopathy. Does not bruise/bleed easily.  Psychiatric/Behavioral: Negative for dysphoric mood. Pos for anxious/ jittery feeling today .         Objective:   Physical Exam  Constitutional: She appears well-developed and well-nourished. No distress.  Well appearing   HENT:  Head: Normocephalic and atraumatic.  Right Ear: External ear normal.  Left Ear: External ear normal.  Mouth/Throat: Oropharynx is clear and moist.  Cerumen impaction R ear (cleared by simmple ear irrigation)  L ear clear   Eyes: Pupils are equal, round, and reactive to light. Conjunctivae and EOM are normal. Right eye exhibits no discharge. Left eye exhibits no discharge. No scleral icterus.  Neck: Normal range of motion. Neck supple. No JVD present. Carotid bruit is not present. No thyromegaly present.  Cardiovascular: Normal heart sounds and intact distal pulses.  Exam reveals no gallop.   irreg irreg rhythm Elevated rate today  Pulmonary/Chest: Effort normal and breath sounds normal. No respiratory distress. She has no wheezes. She has no rales.  No crackles  Abdominal: Soft. Bowel sounds are normal. She exhibits no distension, no abdominal bruit and no mass. There is no tenderness.  Musculoskeletal: She exhibits no edema or tenderness.  Lymphadenopathy:    She has no cervical adenopathy.  Neurological: She is alert. She has normal reflexes.  She displays no tremor. No cranial nerve deficit. She exhibits normal muscle  tone. Coordination normal.  Skin: Skin is warm and dry. No rash noted. No pallor.  Psychiatric: Her speech is normal and behavior is normal. Thought content normal. Her mood appears anxious. Her affect is not blunt, not labile and not inappropriate. She does not exhibit a depressed mood.  Anxious mood today          Assessment & Plan:   Problem List Items Addressed This Visit      Cardiovascular and Mediastinum   Atrial fibrillation ([place])    Rate is up today-pt states she feels somewhat keyed up/ nervous She has appt with Dr Caryl Comes later today  On cartia for rate control  TSH and bmet today        Endocrine   Hypothyroidism    Lab Results  Component Value Date   TSH 3.06 08/26/2016   Pt feels keyed up /anx and her pulse rate is up  Re check this today  Taking the levothyroxine appropriately      Relevant Orders   TSH (Completed)     Nervous and Auditory   Impacted cerumen of right ear    Simple irrigation done today         Musculoskeletal and Integument   Osteopenia - Primary    dexa 7/18 Worse in FN  No falls or fx Rev frax risk data  Disc opt for tx -would start with alendronate  Info given on med as well as px  If successful- would tx for 5 years  Disc poss side eff - esophageal symptoms and low risk of jaw tumor (hold for dental work)  dexa 2 y  Exercise /vit D enc

## 2016-11-20 NOTE — Assessment & Plan Note (Signed)
Rate is up today-pt states she feels somewhat keyed up/ nervous She has appt with Dr Caryl Comes later today  On cartia for rate control  TSH and bmet today

## 2016-11-20 NOTE — Patient Instructions (Signed)
Medication Instructions: - Your physician recommends that you continue on your current medications as directed. Please refer to the Current Medication list given to you today.  Labwork: - none ordered  Procedures/Testing: - Your physician has requested that you have an echocardiogram. Echocardiography is a painless test that uses sound waves to create images of your heart. It provides your doctor with information about the size and shape of your heart and how well your heart's chambers and valves are working. This procedure takes approximately one hour. There are no restrictions for this procedure.  Follow-Up: - Your physician wants you to follow-up in: 6 months with Chanetta Marshall, NP for Dr. Caryl Comes. You will receive a reminder letter in the mail two months in advance. If you don't receive a letter, please call our office to schedule the follow-up appointment.   Any Additional Special Instructions Will Be Listed Below (If Applicable). - Please call the office in about 10 days to follow up on your symptoms    If you need a refill on your cardiac medications before your next appointment, please call your pharmacy.

## 2016-11-20 NOTE — Telephone Encounter (Signed)
Left message to reschedule appt

## 2016-11-25 DIAGNOSIS — H2513 Age-related nuclear cataract, bilateral: Secondary | ICD-10-CM | POA: Diagnosis not present

## 2016-11-26 ENCOUNTER — Ambulatory Visit (INDEPENDENT_AMBULATORY_CARE_PROVIDER_SITE_OTHER): Payer: Medicare Other | Admitting: Internal Medicine

## 2016-11-26 ENCOUNTER — Encounter: Payer: Self-pay | Admitting: Internal Medicine

## 2016-11-26 VITALS — BP 118/72 | HR 100 | Temp 98.1°F | Wt 174.0 lb

## 2016-11-26 DIAGNOSIS — J069 Acute upper respiratory infection, unspecified: Secondary | ICD-10-CM

## 2016-11-26 DIAGNOSIS — J4531 Mild persistent asthma with (acute) exacerbation: Secondary | ICD-10-CM | POA: Diagnosis not present

## 2016-11-26 MED ORDER — AZITHROMYCIN 250 MG PO TABS: ORAL_TABLET | ORAL | 0 refills | Status: DC

## 2016-11-26 NOTE — Patient Instructions (Signed)

## 2016-11-26 NOTE — Progress Notes (Signed)
Subjective:    Patient ID: [name], female    DOB: 11/07/1932, 83 y.o.   MRN: [id number]  HPI  Pt presents to the clinic today with c/o cough and body aches. She reports this started 1 week ago, but got worse 3-4 days ago. She is coughing up blood tinged green mucous. She reports chest tightness but denies shortness of breath. She denies runny nose, nasal congestion, ear pain or sore throat. She denies fever, chills or body aches. Seh has tried Mucinex, Tessalon, Tylenol and her Albuterol inhaler with minimal relief. She has a history of allergies and asthma. She has not had sick contacts. She does not take flu shots but reports her pneumonia shots are UTD.  Review of Systems      Past Medical History:  Diagnosis Date  . Allergic rhinitis   . 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 Arthritis   . Atrial fibrillation -persistent cardiologist-  dr klein/  primary EP -- [facility] Tawanna Sat ([facility])   a. s/p PVI [name] 2010;  b. on tikosyn/coumadin;  c. 05/2009 Echo: EF 60-65%, Gr 2 DD. (first dx 09/ 2007)  . Bilateral lower extremity edema   . Bleeding hemorrhoid   . Carotid stenosis    mild (hosp 3/11)- consult by vasc/ Dr Donnetta Hutching  . Complication of anesthesia    hard to wake  . Depression   . Diverticulosis of colon   . Fatty liver   . H/O cardiac radiofrequency ablation    01/ 2008 at [facility] [place] /  03/ 2010  at [facility]  . Heart failure with preserved ejection fraction ([place])   . Heart murmur    "prior to valve repair"  . History of adenomatous polyp of colon    tubular adenoma's  . History of cardiomyopathy    secondary tachycardia-induced cardiomyopathy -- resolved 2014  . History of squamous cell carcinoma in situ (SCCIS) of skin    05/ 2017  nasal bridge and right medial knee  . History of transient ischemic attack (TIA)    01-24-2005 and 06-12-2009  . Hyperlipidemia   . Hypothyroidism   . Mild intermittent asthma    pulmologist-  dr Gwenette Greet  . Mixed stress and urge  urinary incontinence   . Pulmonary nodule   . S/P mitral valve repair 10-23-1998  dr Boyce Medici at [facility]   for MVP and regurg. (annuloplasty ring procedure)  . Varicose vein of leg   . Wears glasses     Current Outpatient Prescriptions  Medication Sig Dispense Refill  . albuterol (PROVENTIL HFA;VENTOLIN HFA) 108 (90 Base) MCG/ACT inhaler Inhale 2 puffs into the lungs every 4 (four) hours as needed for wheezing or shortness of breath. 1 Inhaler 0  . alendronate (FOSAMAX) 70 MG tablet Take 1 tablet (70 mg total) by mouth every 7 (seven) days. Take with a full glass of water on an empty stomach. 4 tablet 11  . benzonatate (TESSALON) 200 MG capsule Take 1 capsule (200 mg total) by mouth 3 (three) times daily as needed for cough. Swallow whole, do not bite pill 30 capsule 2  . CARTIA XT 180 MG 24 hr capsule TAKE 1 CAPSULE (180 MG TOTAL) BY MOUTH DAILY. (Patient taking differently: Take 180 mg by mouth every morning. ) 90 capsule 2  . cholecalciferol (VITAMIN D) 1000 units tablet Take 1,000 Units by mouth daily.    . cyanocobalamin 1000 MCG tablet Take 1,000 mcg by mouth daily.    . furosemide (  LASIX) 40 MG tablet TAKE 1/2 TABLET BY MOUTH DAILY, MAY INCREASE TO 1 TABLET BY MOUTH DAILY IF NEEDED (Patient taking differently: TAKE 1/2 TABLET BY MOUTH DAILY, MAY INCREASE TO 1 TABLET BY MOUTH DAILY IF NEEDED  for edema) 90 tablet 2  . levothyroxine (SYNTHROID, LEVOTHROID) 25 MCG tablet TAKE 1 TABLET EVERY DAY BEFORE BREAKFAST 90 tablet 1  . loratadine (CLARITIN) 10 MG tablet Take 10 mg by mouth daily as needed for rhinitis.     [name] warfarin (COUMADIN) 5 MG tablet TAKE AS DIRECTED BY ANTI COAGULATION CLINIC 90 tablet 0  . azithromycin (ZITHROMAX) 250 MG tablet Take 2 tabs today, then 1 tab daily x 4 days 6 tablet 0   No current facility-administered medications for this visit.     Allergies  Allergen Reactions  . Amiodarone Hcl Swelling  . Penicillins Rash  . Statins Rash    Family History   Problem Relation Age of Onset  . Hypertension Mother   . Lung cancer Father        smoker  . Alcohol abuse Father   . Cancer Father        bladder and lung CA smoker  . Breast cancer Neg Hx     Social History   Social History  . Marital status: Widowed    Spouse name: N/A  . Number of children: 6  . Years of education: N/A   Occupational History  . realtor Retired   Social History Main Topics  . Smoking status: Never Smoker  . Smokeless tobacco: Never Used  . Alcohol use 0.0 oz/week     Comment: seldom  . Drug use: No  . Sexual activity: No   Other Topics Concern  . Not on file   Social History Narrative   Retired. Daily Caffeine use: 2 daily      Constitutional: Denies fever, malaise, fatigue, headache or abrupt weight changes.  HEENT: Denies eye pain, eye redness, ear pain, ringing in the ears, wax buildup, runny nose, nasal congestion, bloody nose, or sore throat. Respiratory: Pt reports cough and mild shortness of breath. Denies difficulty breathing.   Cardiovascular: Pt reports chest tightness. Denies chest pain, palpitations or swelling in the hands or feet.   No other specific complaints in a complete review of systems (except as listed in HPI above).  Objective:   Physical Exam   BP 118/72   Pulse 100   Temp 98.1 F (36.7 C) (Oral)   Wt 174 lb (78.9 kg)   SpO2 97%   BMI 29.87 kg/m  Wt Readings from Last 3 Encounters:  11/26/16 174 lb (78.9 kg)  11/20/16 175 lb (79.4 kg)  11/20/16 174 lb 12 oz (79.3 kg)    General: Appears her stated age, in NAD. HEENT: Head: normal shape and size, no sinus tenderness noted;  Throat/Mouth: Teeth present, mucosa pink and moist, no exudate, lesions or ulcerations noted.  Neck:  No adenopathy noted.  Pulmonary/Chest: Normal effort and positive vesicular breath sounds. No respiratory distress. No wheezes, rales or ronchi noted.    BMET    Component Value Date/Time   NA 142 10/29/2016 1201   NA 141  06/05/2016 1352   NA 139 06/26/2013 2220   K 3.7 10/29/2016 1201   K 3.5 06/26/2013 2220   CL 105 10/29/2016 1201   CL 106 06/26/2013 2220   CO2 31 08/26/2016 1622   CO2 27 06/26/2013 2220   GLUCOSE 90 10/29/2016 1201   GLUCOSE 136 (H) 06/26/2013  2220   BUN 21 (H) 10/29/2016 1201   BUN 27 06/05/2016 1352   BUN 14 06/26/2013 2220   CREATININE 0.70 10/29/2016 1201   CREATININE 0.80 05/15/2015 0959   CALCIUM 9.8 08/26/2016 1622   CALCIUM 8.3 (L) 06/26/2013 2220   GFRNONAA 52 (L) 06/05/2016 1352   GFRNONAA 70 05/15/2015 0959   GFRAA 60 06/05/2016 1352   GFRAA 81 05/15/2015 0959    Lipid Panel     Component Value Date/Time   CHOL 246 (H) 08/26/2016 1622   TRIG 95.0 08/26/2016 1622   HDL 49.70 08/26/2016 1622   CHOLHDL 5 08/26/2016 1622   VLDL 19.0 08/26/2016 1622   LDLCALC 178 (H) 08/26/2016 1622    CBC    Component Value Date/Time   WBC 6.1 08/26/2016 1622   RBC 4.93 08/26/2016 1622   HGB 12.2 10/29/2016 1201   HGB 13.6 06/05/2016 1352   HCT 36.0 10/29/2016 1201   HCT 40.2 06/05/2016 1352   PLT 168.0 08/26/2016 1622   PLT 168 06/05/2016 1352   MCV 84.2 08/26/2016 1622   MCV 83 06/05/2016 1352   MCV 86 06/26/2013 2220   MCH 28.2 06/05/2016 1352   MCH 28.3 05/15/2015 0959   MCHC 33.5 08/26/2016 1622   RDW 15.0 08/26/2016 1622   RDW 14.7 06/05/2016 1352   RDW 14.6 (H) 06/26/2013 2220   LYMPHSABS 1.9 08/26/2016 1622   MONOABS 0.4 08/26/2016 1622   EOSABS 0.2 08/26/2016 1622   BASOSABS 0.1 08/26/2016 1622    Hgb A1C No results found for: HGBA1C         Assessment & Plan:   Acute URI with Cough:  Encouraged her to continue Mucinex Drink plenty of fluids to thin the mucous Continue Tessalon for cough eRx for Azithromax x 5 days, to start Saturday if no improvement If you start abx, have your INR checked Monday or Tuesday  Return precautions discussed Webb Silversmith, NP

## 2016-11-30 ENCOUNTER — Encounter: Payer: Self-pay | Admitting: Family Medicine

## 2016-11-30 NOTE — Telephone Encounter (Signed)
Sent letter to reschedule coumadin appt

## 2016-12-01 ENCOUNTER — Ambulatory Visit (HOSPITAL_COMMUNITY): Payer: Medicare Other | Attending: Cardiology

## 2016-12-01 ENCOUNTER — Other Ambulatory Visit: Payer: Self-pay

## 2016-12-01 DIAGNOSIS — I340 Nonrheumatic mitral (valve) insufficiency: Secondary | ICD-10-CM | POA: Insufficient documentation

## 2016-12-01 DIAGNOSIS — I509 Heart failure, unspecified: Secondary | ICD-10-CM | POA: Insufficient documentation

## 2016-12-01 DIAGNOSIS — R600 Localized edema: Secondary | ICD-10-CM | POA: Insufficient documentation

## 2016-12-01 DIAGNOSIS — I4891 Unspecified atrial fibrillation: Secondary | ICD-10-CM | POA: Diagnosis not present

## 2016-12-01 DIAGNOSIS — I429 Cardiomyopathy, unspecified: Secondary | ICD-10-CM | POA: Diagnosis not present

## 2016-12-01 DIAGNOSIS — E785 Hyperlipidemia, unspecified: Secondary | ICD-10-CM | POA: Diagnosis not present

## 2016-12-01 DIAGNOSIS — G459 Transient cerebral ischemic attack, unspecified: Secondary | ICD-10-CM | POA: Insufficient documentation

## 2016-12-11 ENCOUNTER — Ambulatory Visit (INDEPENDENT_AMBULATORY_CARE_PROVIDER_SITE_OTHER): Payer: Medicare Other

## 2016-12-11 DIAGNOSIS — I4821 Permanent atrial fibrillation: Secondary | ICD-10-CM

## 2016-12-11 DIAGNOSIS — Z5181 Encounter for therapeutic drug level monitoring: Secondary | ICD-10-CM | POA: Diagnosis not present

## 2016-12-11 LAB — POCT INR: INR: 3.4

## 2016-12-11 NOTE — Patient Instructions (Addendum)
Pre visit review using our clinic review tool, if applicable. No additional management support is needed unless otherwise documented below in the visit note.  INR today 3.4  Patient has had an acute illness, URI, and has taken mucinex with a decongestant, zyrtec and zpack over past 1.5 weeks.  She completed abx therapy on 11/29/16 but has still taken the mucinex/zytrec.  Now that she is better we discussed stopping the decongestant and going back to her claritin prn rather than zyrtec.  Patient aware medications likely caused elevation in INR level and knows to communicate any changes of this nature with me in the future in case dosage adjustments should be made.    Patient will hold coumadin tomorrow (9/22) as already taken today and will stop mucinex and zyrtec and switch over to plain claritin as needed.  She will then continue on prior dosing of 1 pill (5mg ) daily EXCEPT for 1/2 pill on Mondays and Fridays and recheck in 2 weeks.  Patient verbalizes understanding of risks associated with a supratherapeutic level and will go to the ER if any concerns develop. She denies any unusual bruising or bleeding at the present.

## 2016-12-18 ENCOUNTER — Other Ambulatory Visit: Payer: Self-pay | Admitting: Family Medicine

## 2016-12-18 NOTE — Telephone Encounter (Signed)
Will refill electronically  

## 2016-12-18 NOTE — Telephone Encounter (Signed)
Pt has CPE scheduled for 09/01/16, last filled on 03/11/16 #30 caps with 2 additional refills, please advise

## 2016-12-22 ENCOUNTER — Ambulatory Visit (INDEPENDENT_AMBULATORY_CARE_PROVIDER_SITE_OTHER): Payer: Medicare Other

## 2016-12-22 DIAGNOSIS — Z5181 Encounter for therapeutic drug level monitoring: Secondary | ICD-10-CM

## 2016-12-22 DIAGNOSIS — I4821 Permanent atrial fibrillation: Secondary | ICD-10-CM

## 2016-12-22 LAB — POCT INR: INR: 2.9

## 2016-12-22 NOTE — Patient Instructions (Signed)
Pre visit review using our clinic review tool, if applicable. No additional management support is needed unless otherwise documented below in the visit note. 

## 2016-12-24 ENCOUNTER — Ambulatory Visit: Payer: Medicare Other

## 2017-01-01 DIAGNOSIS — D485 Neoplasm of uncertain behavior of skin: Secondary | ICD-10-CM | POA: Diagnosis not present

## 2017-01-08 ENCOUNTER — Encounter: Payer: Self-pay | Admitting: Family Medicine

## 2017-01-08 ENCOUNTER — Ambulatory Visit (INDEPENDENT_AMBULATORY_CARE_PROVIDER_SITE_OTHER): Payer: Medicare Other | Admitting: Family Medicine

## 2017-01-08 VITALS — BP 124/76 | HR 68 | Temp 97.6°F | Ht 65.0 in | Wt 177.8 lb

## 2017-01-08 DIAGNOSIS — I83813 Varicose veins of bilateral lower extremities with pain: Secondary | ICD-10-CM

## 2017-01-08 DIAGNOSIS — R600 Localized edema: Secondary | ICD-10-CM | POA: Diagnosis not present

## 2017-01-08 DIAGNOSIS — I872 Venous insufficiency (chronic) (peripheral): Secondary | ICD-10-CM | POA: Diagnosis not present

## 2017-01-08 MED ORDER — MOMETASONE FUROATE 0.1 % EX CREA: 1.0000 "application " | TOPICAL_CREAM | Freq: Every day | CUTANEOUS | 0 refills | Status: DC

## 2017-01-08 NOTE — Patient Instructions (Addendum)
Wash with dove soap for sensitive skin  Stay out of the pool until wound is healed (antibiotic ointment is fine)   Get some over the counter socks with support to the knee-to use until you get your support hose from the vascular clinic  Elevate feet when you sit   Try the mometasone cream once daily to the rash until it improves   Update if not starting to improve in a week or if worsening    claritin for itching Keep cool!

## 2017-01-08 NOTE — Progress Notes (Signed)
Subjective:    Patient ID: [name], female    DOB: 1932-09-20, 83 y.o.   MRN: [id number]  HPI  Here for leg swelling and rash   Now starting to get a rash on her lower legs  Severely itchy- keeping her up at night   She took an oxycontin last night to help sleep  Used ice  "clear V" ointment  Lidocaine ointment  No steroid creams   Trying hard not to scratch  Both legs  Red and warm to the touch    [facility]   If she wears hose or socks- uses vaseline   No other rash    Wt Readings from Last 3 Encounters:  01/08/17 177 lb 12.8 oz (80.6 kg)  11/26/16 174 lb (78.9 kg)  11/20/16 175 lb (79.4 kg)   29.59 kg/m    Saw Dr Kellie Simmering for varicose veins in august -discussed poss sclerotherapy  Has not started wearing supp hose yet   Lab Results  Component Value Date   CREATININE 0.70 10/29/2016   BUN 21 (H) 10/29/2016   NA 142 10/29/2016   K 3.7 10/29/2016   CL 105 10/29/2016   CO2 31 08/26/2016   Lab Results  Component Value Date   ALT 14 08/26/2016   AST 19 08/26/2016   ALKPHOS 39 08/26/2016   BILITOT 0.8 08/26/2016   Lab Results  Component Value Date   TSH 3.23 11/20/2016    Takes her lasix every day   On diltiazem -edema is side eff  Continues cardiology f/u   Patient Active Problem List   Diagnosis Date Noted  . Venous stasis dermatitis of both lower extremities 01/08/2017  . Impacted cerumen of right ear 11/20/2016  . Varicose veins of bilateral lower extremities with other complications 24/26/8341  . Osteopenia 10/25/2016  . Pedal edema 08/26/2016  . Varicose veins of both lower extremities 08/26/2016  . Estrogen deficiency 08/26/2016  . Screening mammogram, encounter for 08/26/2016  . Hemorrhoids 08/26/2016  . Epistaxis 01/07/2016  . Urticaria 08/22/2014  . Hip pain 08/02/2014  . Left knee pain 08/02/2014  . Chronic cough 05/08/2014  . Blood in stool 04/09/2014  . Caregiver stress 08/16/2013  .  Colon cancer screening 08/16/2013  . Encounter for therapeutic drug monitoring 04/20/2013  . Left ovarian cyst 03/14/2013  . (HFpEF) heart failure with preserved ejection fraction ([place]) 12/27/2012  . Cardiomyopathy, secondary --Resolved again 10/14 10/13/2010  . COLONIC POLYPS, ADENOMATOUS, HX OF 09/18/2009  . PULMONARY NODULE 12/20/2008  . GANGLION CYST 10/04/2007  . Hyperlipidemia 04/27/2007  . DEPRESSION 04/27/2007  . Asthma, mild intermittent 04/27/2007  . INSOMNIA 04/27/2007  . ADENOMATOUS COLONIC POLYP 11/04/2006  . Hypothyroidism 09/02/2006  . Atrial fibrillation ([place]) 08/05/2006   Past Medical History:  Diagnosis Date  . Allergic rhinitis   . 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 Arthritis   . Atrial fibrillation -persistent cardiologist-  dr klein/  primary EP -- [facility] Tawanna Sat ([facility])   a. s/p PVI [name] 2010;  b. on tikosyn/coumadin;  c. 05/2009 Echo: EF 60-65%, Gr 2 DD. (first dx 09/ 2007)  . Bilateral lower extremity edema   . Bleeding hemorrhoid   . Carotid stenosis    mild (hosp 3/11)- consult by vasc/ Dr Donnetta Hutching  . Complication of anesthesia    hard to wake  . Depression   . Diverticulosis of colon   . Fatty liver   . H/O cardiac radiofrequency ablation  01/ 2008 at [facility] [place] /  03/ 2010  at [facility]  . Heart failure with preserved ejection fraction ([place])   . Heart murmur    "prior to valve repair"  . History of adenomatous polyp of colon    tubular adenoma's  . History of cardiomyopathy    secondary tachycardia-induced cardiomyopathy -- resolved 2014  . History of squamous cell carcinoma in situ (SCCIS) of skin    05/ 2017  nasal bridge and right medial knee  . History of transient ischemic attack (TIA)    01-24-2005 and 06-12-2009  . Hyperlipidemia   . Hypothyroidism   . Mild intermittent asthma    pulmologist-  dr Gwenette Greet  . Mixed stress and urge urinary incontinence   . Pulmonary nodule   . S/P mitral valve repair 10-23-1998  dr Boyce Medici at  [facility]   for MVP and regurg. (annuloplasty ring procedure)  . Varicose vein of leg   . Wears glasses    Past Surgical History:  Procedure Laterality Date  . APPENDECTOMY  1978  . CARDIAC ELECTROPHYSIOLOGY [place] AND ABLATION  01/ 2008    at [place]   right-sided ablation atrial flutter  . CARDIAC ELECTROPHYSIOLOGY STUDY AND ABLATION  03/ 2010   dr Jaymes Graff at [facility]   AV node ablation and pulmonary vein isolation for atrial fib  . CARDIOVERSION  06-18-2006;  07-13-2006;  10-19-2010;  10-27-2010  . COLONOSCOPY    . CYSTO/ TRANSURETHRAL COLLAGEN INJECTION THERAPY  07-26-2007   dr Matilde Sprang  . DILATION AND CURETTAGE OF UTERUS    . EXCISIONAL HEMORRHOIDECTOMY  1980s  . HEMORRHOID SURGERY N/A 10/29/2016   Procedure: HEMORRHOIDECTOMY;  Surgeon: Leighton Ruff, MD;  Location: [facility];  Service: General;  Laterality: N/A;  . MITRAL VALVE ANNULOPLASTY  10/23/1998   "Model 4625; Campbell Lerner [id number]"; size 22mm; [facility]; Dr. Boyce Medici  . PILONIDAL CYST EXCISION  1954  . TEE WITH CARDIOVERSION  05-06-2006 at [facility];  01-02-2013 at [facility]  . TRANSTHORACIC ECHOCARDIOGRAM  05-01-2015   dr Caryl Comes   ef 50-55%/  mild AV sclerosis without stenosis/  post MV repair with mild central MR (valve area by pressure half-time 2cm^2,  valve area by continutity equation 0.91cm^2, peak grandiant 22mmHg)/  severe LAE/ mild TR/ mild RAE   . TUBAL LIGATION Bilateral 1978   Social History  Substance Use Topics  . Smoking status: Never Smoker  . Smokeless tobacco: Never Used  . Alcohol use 0.0 oz/week     Comment: seldom   Family History  Problem Relation Age of Onset  . Hypertension Mother   . Lung cancer Father        smoker  . Alcohol abuse Father   . Cancer Father        bladder and lung CA smoker  . Breast cancer Neg Hx    Allergies  Allergen Reactions  . Amiodarone Hcl Swelling  . Penicillins Rash  . Statins Rash   Current Outpatient Prescriptions on File  Prior to Visit  Medication Sig Dispense Refill  . albuterol (PROVENTIL HFA;VENTOLIN HFA) 108 (90 Base) MCG/ACT inhaler Inhale 2 puffs into the lungs every 4 (four) hours as needed for wheezing or shortness of breath. 1 Inhaler 0  . alendronate (FOSAMAX) 70 MG tablet Take 1 tablet (70 mg total) by mouth every 7 (seven) days. Take with a full glass of water on an empty stomach. 4 tablet 11  . azithromycin (ZITHROMAX) 250 MG tablet Take 2 tabs  today, then 1 tab daily x 4 days 6 tablet 0  . benzonatate (TESSALON) 200 MG capsule TAKE 1 CAPSULE BY MOUTH 3 TIMES DAILY AS NEEDED FOR COUGH. SWALLOW WHOLE DO NOT BITE PILL 30 capsule 5  . CARTIA XT 180 MG 24 hr capsule TAKE 1 CAPSULE (180 MG TOTAL) BY MOUTH DAILY. (Patient taking differently: Take 180 mg by mouth every morning. ) 90 capsule 2  . cholecalciferol (VITAMIN D) 1000 units tablet Take 1,000 Units by mouth daily.    . cyanocobalamin 1000 MCG tablet Take 1,000 mcg by mouth daily.    . furosemide (LASIX) 40 MG tablet TAKE 1/2 TABLET BY MOUTH DAILY, MAY INCREASE TO 1 TABLET BY MOUTH DAILY IF NEEDED (Patient taking differently: TAKE 1/2 TABLET BY MOUTH DAILY, MAY INCREASE TO 1 TABLET BY MOUTH DAILY IF NEEDED  for edema) 90 tablet 2  . levothyroxine (SYNTHROID, LEVOTHROID) 25 MCG tablet TAKE 1 TABLET EVERY DAY BEFORE BREAKFAST 90 tablet 1  . loratadine (CLARITIN) 10 MG tablet Take 10 mg by mouth daily as needed for rhinitis.     [name] warfarin (COUMADIN) 5 MG tablet TAKE AS DIRECTED BY ANTI COAGULATION CLINIC 90 tablet 0   No current facility-administered medications on file prior to visit.       Review of Systems  Constitutional: Negative for activity change, appetite change, fatigue, fever and unexpected weight change.  HENT: Negative for congestion, ear pain, rhinorrhea, sinus pressure and sore throat.   Eyes: Negative for pain, redness and visual disturbance.  Respiratory: Negative for cough, shortness of breath and wheezing.   Cardiovascular:  Positive for leg swelling. Negative for chest pain and palpitations.  Gastrointestinal: Negative for abdominal pain, blood in stool, constipation and diarrhea.  Endocrine: Negative for polydipsia and polyuria.  Genitourinary: Negative for dysuria, frequency and urgency.  Musculoskeletal: Negative for arthralgias, back pain and myalgias.  Skin: Negative for pallor and rash.  Allergic/Immunologic: Negative for environmental allergies.  Neurological: Negative for dizziness, syncope and headaches.  Hematological: Negative for adenopathy. Bruises/bleeds easily.  Psychiatric/Behavioral: Negative for decreased concentration and dysphoric mood. The patient is not nervous/anxious.        Objective:   Physical Exam  Constitutional: She appears well-developed and well-nourished. No distress.  HENT:  Head: Normocephalic and atraumatic.  Mouth/Throat: Oropharynx is clear and moist.  Eyes: Pupils are equal, round, and reactive to light. Conjunctivae and EOM are normal.  Neck: Normal range of motion. Neck supple. No JVD present. Carotid bruit is not present. No thyromegaly present.  Cardiovascular: Normal rate, normal heart sounds and intact distal pulses.  Exam reveals no gallop.   Varicosities bilat LE (mostly large spider type)   Edema tr to one plus   Pulmonary/Chest: Effort normal and breath sounds normal. No respiratory distress. She has no wheezes. She has no rales.  No crackles  Abdominal: Soft. Bowel sounds are normal. She exhibits no distension, no abdominal bruit and no mass. There is no tenderness.  Musculoskeletal: She exhibits no edema.  Tr to one plus pitting edema of ankles to shin bilat LE  No palp cord or tenderness  Lymphadenopathy:    She has no cervical adenopathy.  Neurological: She is alert. She has normal reflexes.  Skin: Skin is warm and dry. No rash noted.  Erythematous rash with scale-both LE worse on R  Few excoriations on L  No s/s of cellulitis   No vesicles or  papules  Psychiatric: She has a normal mood and affect.  Assessment & Plan:   Problem List Items Addressed This Visit      Cardiovascular and Mediastinum   Varicose veins of both lower extremities    For f/u with Vein clinic Dr Kellie Simmering Needs to have supp stockings made  Reviewed vascular notes today        Musculoskeletal and Integument   Venous stasis dermatitis of both lower extremities    Pt has varicosities and chronic pedal edema  Rash is worse on R leg but present bilat  Intensely itchy  Recommend support hose Leg elevation  Cold compress prn  claritin for itch Mometasone cream daily as needed  Change to dove soap for sens skin  No swimming until all excoriations are healed Watch closely for s/s of infection  F/u with vein clinic as planned  Continue lasix for edema         Other   Pedal edema - Primary    Worse lately-now with venous stasis dermatitis  Suspect multi factorial - age/weight, standing, venous stasis and varicosities and side eff of diltiazem  Disc elevation of legs supp hose to knee (otc until she gets them from vascular) F/u with vein clinic for further tx  Continue lasix if helpful

## 2017-01-08 NOTE — Assessment & Plan Note (Signed)
Worse lately-now with venous stasis dermatitis  Suspect multi factorial - age/weight, standing, venous stasis and varicosities and side eff of diltiazem  Disc elevation of legs supp hose to knee (otc until she gets them from vascular) F/u with vein clinic for further tx  Continue lasix if helpful

## 2017-01-08 NOTE — Assessment & Plan Note (Signed)
Pt has varicosities and chronic pedal edema  Rash is worse on R leg but present bilat  Intensely itchy  Recommend support hose Leg elevation  Cold compress prn  claritin for itch Mometasone cream daily as needed  Change to dove soap for sens skin  No swimming until all excoriations are healed Watch closely for s/s of infection  F/u with vein clinic as planned  Continue lasix for edema

## 2017-01-08 NOTE — Assessment & Plan Note (Signed)
For f/u with Vein clinic Dr Kellie Simmering Needs to have supp stockings made  Reviewed vascular notes today

## 2017-01-08 NOTE — Progress Notes (Signed)
Pre visit review using our clinic review tool, if applicable. No additional management support is needed unless otherwise documented below in the visit note. 

## 2017-01-19 ENCOUNTER — Ambulatory Visit (INDEPENDENT_AMBULATORY_CARE_PROVIDER_SITE_OTHER): Payer: Medicare Other

## 2017-01-19 DIAGNOSIS — I4821 Permanent atrial fibrillation: Secondary | ICD-10-CM

## 2017-01-19 DIAGNOSIS — Z5181 Encounter for therapeutic drug level monitoring: Secondary | ICD-10-CM | POA: Diagnosis not present

## 2017-01-19 DIAGNOSIS — Z23 Encounter for immunization: Secondary | ICD-10-CM

## 2017-01-19 DIAGNOSIS — I4891 Unspecified atrial fibrillation: Secondary | ICD-10-CM | POA: Diagnosis not present

## 2017-01-19 LAB — POCT INR: INR: 4

## 2017-01-19 NOTE — Patient Instructions (Addendum)
Pre visit review using our clinic review tool, if applicable. No additional management support is needed unless otherwise documented below in the visit note.   INR TODAY 4.0  Patient denies any changes in diet, health or medications.  She does note some increase in bruising but no uncontrollable or concerning symptoms.  Patient denies any bleeding.  This Probation officer instructs patient to hold her coumadin today and then take decreased dosing of 1 pill (5mg ) daily EXCEPT for 1/2 pill (2.5mg ) on Mondays, Wednesdays and Fridays.  Will recheck in 2 weeks.    Patient educated on risks associated with supratherapeutic level and is able to verbalize understanding of all instructions given today.  She will go to ER if any concerns develop.

## 2017-01-29 ENCOUNTER — Other Ambulatory Visit: Payer: Self-pay

## 2017-01-29 MED ORDER — WARFARIN SODIUM 5 MG PO TABS: ORAL_TABLET | ORAL | 1 refills | Status: DC

## 2017-01-29 NOTE — Telephone Encounter (Signed)
Pharmacy sends request for warfarin refill.  Patient is compliant with therapy will refill X 6 months.

## 2017-02-02 ENCOUNTER — Ambulatory Visit (INDEPENDENT_AMBULATORY_CARE_PROVIDER_SITE_OTHER): Payer: Medicare Other | Admitting: Family Medicine

## 2017-02-02 DIAGNOSIS — I4821 Permanent atrial fibrillation: Secondary | ICD-10-CM

## 2017-02-02 DIAGNOSIS — Z5181 Encounter for therapeutic drug level monitoring: Secondary | ICD-10-CM

## 2017-02-02 LAB — POCT INR: INR: 2.2

## 2017-02-02 NOTE — Patient Instructions (Signed)
INR today 2.2  Continue to take 1 pill (5mg ) daily EXCEPT for 1/2 pill (2.5mg ) on Mondays, Wednesdays and Fridays.  Recheck in 4 weeks.

## 2017-02-03 ENCOUNTER — Telehealth: Payer: Self-pay

## 2017-02-03 MED ORDER — WARFARIN SODIUM 5 MG PO TABS: ORAL_TABLET | ORAL | 0 refills | Status: DC

## 2017-02-03 NOTE — Telephone Encounter (Signed)
Will send in a 12 day supply to local pharmacy.  Patient notified.

## 2017-02-03 NOTE — Telephone Encounter (Signed)
Copied from [place] [id number]. Topic: Inquiry >> Feb 03, 2017  1:07 PM Pricilla Handler wrote: Reason for CRM: Patient called requesting a refill of Warfarin (COUMADIN) 5 MG tablet. Patient needs a twelve day supply until mail order pharmacy supply is delivered. Please call patient. Patient states that she only have one bill left.

## 2017-02-14 ENCOUNTER — Emergency Department
Admission: EM | Admit: 2017-02-14 | Discharge: 2017-02-14 | Disposition: A | Discharge status: Home / Self Care | Payer: Medicare Other | Attending: Emergency Medicine | Admitting: Emergency Medicine

## 2017-02-14 ENCOUNTER — Other Ambulatory Visit: Payer: Self-pay

## 2017-02-14 ENCOUNTER — Emergency Department: Payer: Medicare Other

## 2017-02-14 DIAGNOSIS — J45909 Unspecified asthma, uncomplicated: Secondary | ICD-10-CM | POA: Insufficient documentation

## 2017-02-14 DIAGNOSIS — Z7901 Long term (current) use of anticoagulants: Secondary | ICD-10-CM | POA: Insufficient documentation

## 2017-02-14 DIAGNOSIS — Y929 Unspecified place or not applicable: Secondary | ICD-10-CM | POA: Insufficient documentation

## 2017-02-14 DIAGNOSIS — S5001XA Contusion of right elbow, initial encounter: Secondary | ICD-10-CM | POA: Diagnosis not present

## 2017-02-14 DIAGNOSIS — E039 Hypothyroidism, unspecified: Secondary | ICD-10-CM | POA: Diagnosis not present

## 2017-02-14 DIAGNOSIS — M25522 Pain in left elbow: Secondary | ICD-10-CM | POA: Diagnosis not present

## 2017-02-14 DIAGNOSIS — Y9301 Activity, walking, marching and hiking: Secondary | ICD-10-CM | POA: Diagnosis not present

## 2017-02-14 DIAGNOSIS — M7989 Other specified soft tissue disorders: Secondary | ICD-10-CM | POA: Diagnosis not present

## 2017-02-14 DIAGNOSIS — Y999 Unspecified external cause status: Secondary | ICD-10-CM | POA: Insufficient documentation

## 2017-02-14 DIAGNOSIS — S59901A Unspecified injury of right elbow, initial encounter: Secondary | ICD-10-CM | POA: Diagnosis present

## 2017-02-14 DIAGNOSIS — Z79899 Other long term (current) drug therapy: Secondary | ICD-10-CM | POA: Insufficient documentation

## 2017-02-14 DIAGNOSIS — W19XXXA Unspecified fall, initial encounter: Secondary | ICD-10-CM | POA: Insufficient documentation

## 2017-02-14 NOTE — ED Provider Notes (Signed)
[facility] Emergency Department Provider Note  ____________________________________________  Time seen: Approximately 7:52 PM  I have reviewed the triage vital signs and the nursing notes.   HISTORY  Chief Complaint Fall    HPI [name] is a 83 y.o. female presents to the emergency department with right elbow discomfort and palpable hematoma after tripping earlier today.  Patient reports no loss of consciousness.  She has been ambulating without difficulty.  She denies new blurry vision, weakness, radiculopathy or changes in sensation.  Patient denies chest pain, chest tightness, shortness of breath, nausea, vomiting and abdominal pain.  No alleviating measures have been attempted prior to presenting to the emergency department.   Past Medical History:  Diagnosis Date  . Allergic rhinitis   . 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 Arthritis   . Atrial fibrillation -persistent cardiologist-  dr klein/  primary EP -- [facility]awanna Sat ([facility])   a. s/p PVI [name] 2010;  b. on tikosyn/coumadin;  c. 05/2009 Echo: EF 60-65%, Gr 2 DD. (first dx 09/ 2007)  . Bilateral lower extremity edema   . Bleeding hemorrhoid   . Carotid stenosis    mild (hosp 3/11)- consult by vasc/ Dr Donnetta Hutching  . Complication of anesthesia    hard to wake  . Depression   . Diverticulosis of colon   . Fatty liver   . H/O cardiac radiofrequency ablation    01/ 2008 at [place] [facility] [place] /  03/ 2010  at [facility]  . Heart failure with preserved ejection fraction ([place])   . Heart murmur    "prior to valve repair"  . History of adenomatous polyp of colon    tubular adenoma's  . History of cardiomyopathy    secondary tachycardia-induced cardiomyopathy -- resolved 2014  . History of squamous cell carcinoma in situ (SCCIS) of skin    05/ 2017  nasal bridge and right medial knee  . History of transient ischemic attack (TIA)    01-24-2005 and 06-12-2009  . Hyperlipidemia   . Hypothyroidism   . Mild  intermittent asthma    pulmologist-  dr Gwenette Greet  . Mixed stress and urge urinary incontinence   . Pulmonary nodule   . S/P mitral valve repair 10-23-1998  dr Boyce Medici at [facility]   for MVP and regurg. (annuloplasty ring procedure)  . Varicose vein of leg   . Wears glasses     Patient Active Problem List   Diagnosis Date Noted  . Venous stasis dermatitis of both lower extremities 01/08/2017  . Impacted cerumen of right ear 11/20/2016  . Varicose veins of bilateral lower extremities with other complications 62/37/6283  . Osteopenia 10/25/2016  . Pedal edema 08/26/2016  . Varicose veins of both lower extremities 08/26/2016  . Estrogen deficiency 08/26/2016  . Screening mammogram, encounter for 08/26/2016  . Hemorrhoids 08/26/2016  . Epistaxis 01/07/2016  . Urticaria 08/22/2014  . Hip pain 08/02/2014  . Left knee pain 08/02/2014  . Chronic cough 05/08/2014  . Blood in stool 04/09/2014  . Caregiver stress 08/16/2013  . Colon cancer screening 08/16/2013  . Encounter for therapeutic drug monitoring 04/20/2013  . Left ovarian cyst 03/14/2013  . (HFpEF) heart failure with preserved ejection fraction ([name]) 12/27/2012  . Cardiomyopathy, secondary --Resolved again 10/14 10/13/2010  . COLONIC POLYPS, ADENOMATOUS, HX OF 09/18/2009  . PULMONARY NODULE 12/20/2008  . GANGLION CYST 10/04/2007  . Hyperlipidemia 04/27/2007  . DEPRESSION 04/27/2007  . Asthma, mild intermittent 04/27/2007  . INSOMNIA 04/27/2007  .  ADENOMATOUS COLONIC POLYP 11/04/2006  . Hypothyroidism 09/02/2006  . Atrial fibrillation ([name]) 08/05/2006    Past Surgical History:  Procedure Laterality Date  . APPENDECTOMY  1978  . CARDIAC ELECTROPHYSIOLOGY [place] AND ABLATION  01/ 2008    at [place]   right-sided ablation atrial flutter  . CARDIAC ELECTROPHYSIOLOGY STUDY AND ABLATION  03/ 2010   dr Jaymes Graff at [facility]   AV node ablation and pulmonary vein isolation for atrial fib  . CARDIOVERSION   06-18-2006;  07-13-2006;  10-19-2010;  10-27-2010  . COLONOSCOPY    . CYSTO/ TRANSURETHRAL COLLAGEN INJECTION THERAPY  07-26-2007   dr Matilde Sprang  . DILATION AND CURETTAGE OF UTERUS    . EXCISIONAL HEMORRHOIDECTOMY  1980s  . HEMORRHOID SURGERY N/A 10/29/2016   Procedure: HEMORRHOIDECTOMY;  Surgeon: Leighton Ruff, MD;  Location[facility];  Service: General;  Laterality: N/A;  . MITRAL VALVE ANNULOPLASTY  10/23/1998   "Model 4625; Campbell Lerner [id number]"; size 41mm; [facility]; Dr. Boyce Medici  . PILONIDAL CYST EXCISION  1954  . TEE WITH CARDIOVERSION  05-06-2006 at [facility];  01-02-2013 at [facility]  . TRANSTHORACIC ECHOCARDIOGRAM  05-01-2015   dr Caryl Comes   ef 50-55%/  mild AV sclerosis without stenosis/  post MV repair with mild central MR (valve area by pressure half-time 2cm^2,  valve area by continutity equation 0.91cm^2, peak grandiant 31mmHg)/  severe LAE/ mild TR/ mild RAE   . TUBAL LIGATION Bilateral 1978    Prior to Admission medications   Medication Sig Start Date End Date Taking? Authorizing Provider  albuterol (PROVENTIL HFA;VENTOLIN HFA) 108 (90 Base) MCG/ACT inhaler Inhale 2 puffs into the lungs every 4 (four) hours as needed for wheezing or shortness of breath. 06/23/16   Tower, Wynelle Fanny, MD  alendronate (FOSAMAX) 70 MG tablet Take 1 tablet (70 mg total) by mouth every 7 (seven) days. Take with a full glass of water on an empty stomach. 11/20/16   Tower, Wynelle Fanny, MD  azithromycin (ZITHROMAX) 250 MG tablet Take 2 tabs today, then 1 tab daily x 4 days 11/26/16   Jearld Fenton, NP  benzonatate (TESSALON) 200 MG capsule TAKE 1 CAPSULE BY MOUTH 3 TIMES DAILY AS NEEDED FOR COUGH. SWALLOW WHOLE DO NOT BITE PILL 12/18/16   Tower, Marne A, MD  CARTIA XT 180 MG 24 hr capsule TAKE 1 CAPSULE (180 MG TOTAL) BY MOUTH DAILY. Patient taking differently: Take 180 mg by mouth every morning.  08/19/16   Deboraha Sprang, MD  cholecalciferol (VITAMIN D) 1000 units tablet Take 1,000 Units by mouth daily.     [provider]  cyanocobalamin 1000 MCG tablet Take 1,000 mcg by mouth daily.    [provider]  furosemide (LASIX) 40 MG tablet TAKE 1/2 TABLET BY MOUTH DAILY, MAY INCREASE TO 1 TABLET BY MOUTH DAILY IF NEEDED Patient taking differently: TAKE 1/2 TABLET BY MOUTH DAILY, MAY INCREASE TO 1 TABLET BY MOUTH DAILY IF NEEDED  for edema 08/19/16   Deboraha Sprang, MD  levothyroxine (SYNTHROID, LEVOTHROID) 25 MCG tablet TAKE 1 TABLET EVERY DAY BEFORE BREAKFAST 08/18/16   Tower, Wynelle Fanny, MD  loratadine (CLARITIN) 10 MG tablet Take 10 mg by mouth daily as needed for rhinitis.     [provider]  mometasone (ELOCON) 0.1 % cream Apply 1 application topically daily. To affected areas 01/08/17   Tower, Wynelle Fanny, MD  warfarin (COUMADIN) 5 MG tablet TAKE AS DIRECTED BY ANTI COAGULATION CLINIC 02/03/17   Tower, Kaibito A,  MD    Allergies Amiodarone hcl; Penicillins; and Statins  Family History  Problem Relation Age of Onset  . Hypertension Mother   . Lung cancer Father        smoker  . Alcohol abuse Father   . Cancer Father        bladder and lung CA smoker  . Breast cancer Neg Hx     Social History Social History   Tobacco Use  . Smoking status: Never Smoker  . Smokeless tobacco: Never Used  Substance Use Topics  . Alcohol use: Yes    Alcohol/week: 0.0 oz    Comment: seldom  . Drug use: No     Review of Systems  Constitutional: No fever/chills Eyes: No visual changes. No discharge ENT: No upper respiratory complaints. Cardiovascular: no chest pain. Respiratory: no cough. No SOB. Gastrointestinal: No abdominal pain.  No nausea, no vomiting.  No diarrhea.  No constipation. Musculoskeletal: Patient has right elbow pain.  Skin: Negative for rash, abrasions, lacerations, ecchymosis. Neurological: Negative for headaches, focal weakness or numbness.   ____________________________________________   PHYSICAL EXAM:  VITAL SIGNS: ED Triage Vitals [02/14/17 1922]   Enc Vitals Group     BP 127/81     Pulse Rate 89     Resp 18     Temp 97.7 F (36.5 C)     Temp Source Oral     SpO2 100 %     Weight 170 lb (77.1 kg)     Height 5\' 5"  (1.651 m)     Head Circumference      Peak Flow      Pain Score 3     Pain Loc      Pain Edu?      Excl. in [place]?      Constitutional: Alert and oriented. Well appearing and in no acute distress. Eyes: Conjunctivae are normal. PERRL. EOMI. Head: Atraumatic. Cardiovascular: Normal rate, regular rhythm. Normal S1 and S2.  Good peripheral circulation. Respiratory: Normal respiratory effort without tachypnea or retractions. Lungs CTAB. Good air entry to the bases with no decreased or absent breath sounds. Musculoskeletal: Patient has palpable right elbow hematoma.  Patient exhibits full range of motion at the right elbow, right shoulder and right wrist.  Palpable radial pulse, right. Neurologic:  Normal speech and language. No gross focal neurologic deficits are appreciated.  Skin:  Skin is warm, dry and intact. No rash noted. Psychiatric: Mood and affect are normal. Speech and behavior are normal. Patient exhibits appropriate insight and judgement.   ____________________________________________   LABS (all labs ordered are listed, but only abnormal results are displayed)  Labs Reviewed - No data to display ____________________________________________  EKG   ____________________________________________  RADIOLOGY Unk Pinto, personally viewed and evaluated these images (plain radiographs) as part of my medical decision making, as well as reviewing the written report by the radiologist.    Dg Elbow Complete Right  Result Date: 02/14/2017 CLINICAL DATA:  Patient tripped and fell. Swelling of the right elbow with tingling. EXAM: RIGHT ELBOW - COMPLETE 3+ VIEW COMPARISON:  None. FINDINGS: There is no evidence of fracture, dislocation, or joint effusion. There is no evidence of arthropathy or other  focal bone abnormality. Soft tissue swelling with induration along the posteromedial aspect of the proximal forearm and elbow. IMPRESSION: Soft tissue contusion of the elbow and proximal forearm posteromedially. No acute osseous abnormality nor joint dislocation. Electronically Signed   By: Ashley Royalty M.D.   On: 02/14/2017  21:16    ____________________________________________    PROCEDURES  Procedure(s) performed:    Procedures    Medications - No data to display   ____________________________________________   INITIAL IMPRESSION / ASSESSMENT AND PLAN / ED COURSE  Pertinent labs & imaging results that were available during my care of the patient were reviewed by me and considered in my medical decision making (see chart for details).  Review of the [place] CSRS was performed in accordance of the [place] prior to dispensing any controlled drugs.     Assessment and Plan: Fall Patient presents to the emergency department after falling against her right elbow earlier this evening.  Patient noticed soft tissue edema and ecchymosis and became concerned as patient is currently taking warfarin.  On physical exam, patient has a modest right elbow hematoma.  She exhibits full range of motion with a palpable radial pulse.  Supportive measures were encouraged.  Patient was advised to follow-up with orthopedics as needed.  Patient declined pain medication in the emergency department.  All patient questions were answered.    ____________________________________________  FINAL CLINICAL IMPRESSION(S) / ED DIAGNOSES  Final diagnoses:  Contusion of right elbow, initial encounter      NEW MEDICATIONS STARTED DURING THIS VISIT:  ED Discharge Orders    None          This chart was dictated using voice recognition software/Dragon. Despite best efforts to proofread, errors can occur which can change the meaning. Any change was purely unintentional.    Lannie Fields, PA-C 02/14/17  2304    Darel Hong, MD 02/15/17 1045

## 2017-02-14 NOTE — ED Triage Notes (Signed)
Pt reports tripping while putting on her shoes, pt states that she landed on her rt elbow, pt has swelling to the rt elbow with tingling, pt is able to bend her elbow, but is concerned because of the tingling and swelling and is taking coumadin

## 2017-02-17 DIAGNOSIS — M1612 Unilateral primary osteoarthritis, left hip: Secondary | ICD-10-CM | POA: Diagnosis not present

## 2017-02-17 DIAGNOSIS — S5001XA Contusion of right elbow, initial encounter: Secondary | ICD-10-CM | POA: Diagnosis not present

## 2017-02-17 DIAGNOSIS — S8012XA Contusion of left lower leg, initial encounter: Secondary | ICD-10-CM | POA: Diagnosis not present

## 2017-02-22 ENCOUNTER — Ambulatory Visit (INDEPENDENT_AMBULATORY_CARE_PROVIDER_SITE_OTHER): Payer: Medicare Other | Admitting: Family Medicine

## 2017-02-22 ENCOUNTER — Other Ambulatory Visit: Payer: Self-pay

## 2017-02-22 ENCOUNTER — Encounter: Payer: Self-pay | Admitting: Family Medicine

## 2017-02-22 VITALS — BP 100/62 | HR 94 | Temp 98.3°F | Ht 65.0 in | Wt 180.2 lb

## 2017-02-22 DIAGNOSIS — S8012XA Contusion of left lower leg, initial encounter: Secondary | ICD-10-CM | POA: Diagnosis not present

## 2017-02-22 DIAGNOSIS — S5001XA Contusion of right elbow, initial encounter: Secondary | ICD-10-CM

## 2017-02-22 MED ORDER — HYDROCODONE-ACETAMINOPHEN 5-325 MG PO TABS: 1.0000 | ORAL_TABLET | Freq: Four times a day (QID) | ORAL | 0 refills | Status: DC | PRN

## 2017-02-22 NOTE — Progress Notes (Signed)
Dr. Frederico Hamman T. Tianne Plott, MD, [facility] [place], [address] Phone: [phone] Fax: [phone]  02/22/2017  Patient: [name], MRN: [id number], DOB: 1932-12-30, 83 y.o.  Primary Physician:  Tower, Wynelle Fanny, MD   Chief Complaint  Patient presents with  . Fall    02/14/17-Seen in ED   . Leg Pain    Left   Subjective:   [name] is a 83 y.o. very pleasant female patient who presents with the following:  R elbow hematoma + L leg bruising. Stumbled.   Pleasant 83 year old patient who had a fall about a week or so ago who presents now in follow-up after being seen in the ER on February 14, 2017.  She has now a painful left-sided hematoma on the left shin as well as some adjacent bruising.  She also had a hematoma on the right elbow, but that is resolving and she does have some bruising on the right forearm throughout.  Initial elbow films were negative for any acute fracture.  She has good range of motion in the elbow.  Past Medical History, Surgical History, Social History, Family History, Problem List, Medications, and Allergies have been reviewed and updated if relevant.  Patient Active Problem List   Diagnosis Date Noted  . Venous stasis dermatitis of both lower extremities 01/08/2017  . Impacted cerumen of right ear 11/20/2016  . Varicose veins of bilateral lower extremities with other complications 82/42/3536  . Osteopenia 10/25/2016  . Pedal edema 08/26/2016  . Varicose veins of both lower extremities 08/26/2016  . Estrogen deficiency 08/26/2016  . Screening mammogram, encounter for 08/26/2016  . Hemorrhoids 08/26/2016  . Epistaxis 01/07/2016  . Urticaria 08/22/2014  . Hip pain 08/02/2014  . Left knee pain 08/02/2014  . Chronic cough 05/08/2014  . Blood in stool 04/09/2014  . Caregiver stress 08/16/2013  . Colon cancer screening 08/16/2013  . Encounter for therapeutic drug monitoring 04/20/2013    . Left ovarian cyst 03/14/2013  . (HFpEF) heart failure with preserved ejection fraction ([place]) 12/27/2012  . Cardiomyopathy, secondary --Resolved again 10/14 10/13/2010  . COLONIC POLYPS, ADENOMATOUS, HX OF 09/18/2009  . PULMONARY NODULE 12/20/2008  . GANGLION CYST 10/04/2007  . Hyperlipidemia 04/27/2007  . DEPRESSION 04/27/2007  . Asthma, mild intermittent 04/27/2007  . INSOMNIA 04/27/2007  . ADENOMATOUS COLONIC POLYP 11/04/2006  . Hypothyroidism 09/02/2006  . Atrial fibrillation ([place]) 08/05/2006    Past Medical History:  Diagnosis Date  . Allergic rhinitis   . 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 Arthritis   . Atrial fibrillation -persistent cardiologist-  dr klein/  primary EP -- [facility] Tawanna Sat ([facility])   a. s/p PVI [name] 2010;  b. on tikosyn/coumadin;  c. 05/2009 Echo: EF 60-65%, Gr 2 DD. (first dx 09/ 2007)  . Bilateral lower extremity edema   . Bleeding hemorrhoid   . Carotid stenosis    mild (hosp 3/11)- consult by vasc/ Dr Donnetta Hutching  . Complication of anesthesia    hard to wake  . Depression   . Diverticulosis of colon   . Fatty liver   . H/O cardiac radiofrequency ablation    01/ 2008 at [place] [facility] [place] /  03/ 2010  at [facility]  . Heart failure with preserved ejection fraction ([place])   . Heart murmur    "prior to valve repair"  . History of adenomatous polyp of colon    tubular adenoma's  . History of cardiomyopathy  secondary tachycardia-induced cardiomyopathy -- resolved 2014  . History of squamous cell carcinoma in situ (SCCIS) of skin    05/ 2017  nasal bridge and right medial knee  . History of transient ischemic attack (TIA)    01-24-2005 and 06-12-2009  . Hyperlipidemia   . Hypothyroidism   . Mild intermittent asthma    pulmologist-  dr Gwenette Greet  . Mixed stress and urge urinary incontinence   . Pulmonary nodule   . S/P mitral valve repair 10-23-1998  dr Boyce Medici at [facility]   for MVP and regurg. (annuloplasty ring procedure)  . Varicose vein of  leg   . Wears glasses     Past Surgical History:  Procedure Laterality Date  . APPENDECTOMY  1978  . CARDIAC ELECTROPHYSIOLOGY [place] AND ABLATION  01/ 2008    at [place]   right-sided ablation atrial flutter  . CARDIAC ELECTROPHYSIOLOGY STUDY AND ABLATION  03/ 2010   dr Jaymes Graff at [facility]   AV node ablation and pulmonary vein isolation for atrial fib  . CARDIOVERSION  06-18-2006;  07-13-2006;  10-19-2010;  10-27-2010  . COLONOSCOPY    . CYSTO/ TRANSURETHRAL COLLAGEN INJECTION THERAPY  07-26-2007   dr Matilde Sprang  . DILATION AND CURETTAGE OF UTERUS    . EXCISIONAL HEMORRHOIDECTOMY  1980s  . HEMORRHOID SURGERY N/A 10/29/2016   Procedure: HEMORRHOIDECTOMY;  Surgeon: Leighton Ruff, MD;  Location: [facility];  Service: General;  Laterality: N/A;  . MITRAL VALVE ANNULOPLASTY  10/23/1998   "Model 4625; Campbell Lerner [id number]"; size 69mm; [facility]; Dr. Boyce Medici  . PILONIDAL CYST EXCISION  1954  . TEE WITH CARDIOVERSION  05-06-2006 at [facility];  01-02-2013 at [facility]  . TRANSTHORACIC ECHOCARDIOGRAM  05-01-2015   dr Caryl Comes   ef 50-55%/  mild AV sclerosis without stenosis/  post MV repair with mild central MR (valve area by pressure half-time 2cm^2,  valve area by continutity equation 0.91cm^2, peak grandiant 17mmHg)/  severe LAE/ mild TR/ mild RAE   . TUBAL LIGATION Bilateral 1978    Social History   Socioeconomic History  . Marital status: Widowed    Spouse name: Not on file  . Number of children: 6  . Years of education: Not on file  . Highest education level: Not on file  Social Needs  . Financial resource strain: Not on file  . Food insecurity - worry: Not on file  . Food insecurity - inability: Not on file  . Transportation needs - medical: Not on file  . Transportation needs - non-medical: Not on file  Occupational History  . Occupation: Architectural technologist: RETIRED  Tobacco Use  . Smoking status: Never Smoker  . Smokeless tobacco: Never Used  Substance  and Sexual Activity  . Alcohol use: Yes    Alcohol/week: 0.0 oz    Comment: seldom  . Drug use: No  . Sexual activity: No  Other Topics Concern  . Not on file  Social History Narrative   Retired. Daily Caffeine use: 2 daily     Family History  Problem Relation Age of Onset  . Hypertension Mother   . Lung cancer Father        smoker  . Alcohol abuse Father   . Cancer Father        bladder and lung CA smoker  . Breast cancer Neg Hx     Allergies  Allergen Reactions  . Amiodarone Hcl Swelling  . Penicillins Rash  . Statins Rash  Medication list reviewed and updated in full in [place].  GEN: No fevers, chills. Nontoxic. Primarily MSK c/o today. MSK: Detailed in the HPI GI: tolerating PO intake without difficulty Neuro: No numbness, parasthesias, or tingling associated. Otherwise the pertinent positives of the ROS are noted above.   Objective:   BP 100/62   Pulse 94   Temp 98.3 F (36.8 C) (Oral)   Ht 5\' 5"  (1.651 m)   Wt 180 lb 4 oz (81.8 kg)   BMI 30.00 kg/m    GEN: WDWN, NAD, Non-toxic, Alert & Oriented x 3 HEENT: Atraumatic, Normocephalic.  Ears and Nose: No external deformity. EXTR: No clubbing/cyanosis/edema NEURO: Normal gait.  PSYCH: Normally interactive. Conversant. Not depressed or anxious appearing.  Calm demeanor.   R elbow Ecchymosis or edema: neg ROM: full flexion, extension, pronation, supination Shoulder ROM: Full Flexion: 5/5 Extension: 5/5 Supination: 5/5  Pronation: 5/5 Wrist ext: 5/5 Wrist flexion: 5/5 No gross bony abnormality Varus and Valgus stress: stable ECRB tenderness: neg Medial epicondyle: NT Lateral epicondyle, resisted wrist extension from wrist full pronation and flexion: NT grip: 5/5  sensation intact Tinel's, Elbow: negative   Left lower extremity there is some relatively diffuse bruising as well as a localized hematoma that is approximately 3 cm across.  This is the area of primary pain.  No focal  bony tenderness.  Radiology: Dg Elbow Complete Right  Result Date: 02/14/2017 CLINICAL DATA:  Patient tripped and fell. Swelling of the right elbow with tingling. EXAM: RIGHT ELBOW - COMPLETE 3+ VIEW COMPARISON:  None. FINDINGS: There is no evidence of fracture, dislocation, or joint effusion. There is no evidence of arthropathy or other focal bone abnormality. Soft tissue swelling with induration along the posteromedial aspect of the proximal forearm and elbow. IMPRESSION: Soft tissue contusion of the elbow and proximal forearm posteromedially. No acute osseous abnormality nor joint dislocation. Electronically Signed   By: Ashley Royalty M.D.   On: 02/14/2017 21:16    Assessment and Plan:   Leg hematoma, left, initial encounter  Traumatic hematoma of right elbow, initial encounter  She does continue to have some significant pain.  Recommended that she use some icing and elevation to help with some swelling.  Also gave her 20 tablets of Norco to use on a as needed basis for worse pain.  Otherwise continue with Tylenol.  Follow-up: No Follow-up on file.  Meds ordered this encounter  Medications  . HYDROcodone-acetaminophen (NORCO/VICODIN) 5-325 MG tablet    Sig: Take 1 tablet by mouth every 6 (six) hours as needed for moderate pain.    Dispense:  20 tablet    Refill:  0   Medications Discontinued During This Encounter  Medication Reason  . azithromycin (ZITHROMAX) 250 MG tablet Completed Course  . benzonatate (TESSALON) 200 MG capsule Completed Course  . mometasone (ELOCON) 0.1 % cream Completed Course   No orders of the defined types were placed in this encounter.   Signed,  Maud Deed. Rickesha Veracruz, MD   Allergies as of 02/22/2017      Reactions   Amiodarone Hcl Swelling   Penicillins Rash   Statins Rash      Medication List        Accurate as of 02/22/17  1:38 PM. Always use your most recent med list.          albuterol 108 (90 Base) MCG/ACT inhaler Commonly known as:   PROVENTIL HFA;VENTOLIN HFA Inhale 2 puffs into the lungs every 4 (four) hours as  needed for wheezing or shortness of breath.   alendronate 70 MG tablet Commonly known as:  FOSAMAX Take 1 tablet (70 mg total) by mouth every 7 (seven) days. Take with a full glass of water on an empty stomach.   CARTIA XT 180 MG 24 hr capsule Generic drug:  diltiazem TAKE 1 CAPSULE (180 MG TOTAL) BY MOUTH DAILY.   cholecalciferol 1000 units tablet Commonly known as:  VITAMIN D Take 1,000 Units by mouth daily.   cyanocobalamin 1000 MCG tablet Take 1,000 mcg by mouth daily.   furosemide 40 MG tablet Commonly known as:  LASIX TAKE 1/2 TABLET BY MOUTH DAILY, MAY INCREASE TO 1 TABLET BY MOUTH DAILY IF NEEDED   HYDROcodone-acetaminophen 5-325 MG tablet Commonly known as:  NORCO/VICODIN Take 1 tablet by mouth every 6 (six) hours as needed for moderate pain.   levothyroxine 25 MCG tablet Commonly known as:  SYNTHROID, LEVOTHROID TAKE 1 TABLET EVERY DAY BEFORE BREAKFAST   loratadine 10 MG tablet Commonly known as:  CLARITIN Take 10 mg by mouth daily as needed for rhinitis.   warfarin 5 MG tablet Commonly known as:  COUMADIN Take as directed by the anticoagulation clinic. If you are unsure how to take this medication, talk to your nurse or doctor. Original instructions:  TAKE AS DIRECTED BY ANTI COAGULATION CLINIC

## 2017-02-25 DIAGNOSIS — D485 Neoplasm of uncertain behavior of skin: Secondary | ICD-10-CM | POA: Diagnosis not present

## 2017-02-25 DIAGNOSIS — L820 Inflamed seborrheic keratosis: Secondary | ICD-10-CM | POA: Diagnosis not present

## 2017-03-02 ENCOUNTER — Ambulatory Visit: Payer: Medicare Other

## 2017-03-04 ENCOUNTER — Ambulatory Visit (INDEPENDENT_AMBULATORY_CARE_PROVIDER_SITE_OTHER): Payer: Medicare Other | Admitting: General Practice

## 2017-03-04 DIAGNOSIS — I4891 Unspecified atrial fibrillation: Secondary | ICD-10-CM

## 2017-03-04 DIAGNOSIS — Z5181 Encounter for therapeutic drug level monitoring: Secondary | ICD-10-CM

## 2017-03-04 DIAGNOSIS — Z7901 Long term (current) use of anticoagulants: Secondary | ICD-10-CM | POA: Insufficient documentation

## 2017-03-04 LAB — POCT INR: INR: 3

## 2017-03-04 NOTE — Patient Instructions (Signed)
Pre visit review using our clinic review tool, if applicable. No additional management support is needed unless otherwise documented below in the visit note.  Continue to take 1 pill (5mg ) daily EXCEPT for 1/2 pill (2.5mg ) on Mondays, Wednesdays and Fridays.  Recheck in 4 weeks.

## 2017-03-17 ENCOUNTER — Telehealth: Payer: Self-pay | Admitting: Family Medicine

## 2017-03-17 MED ORDER — LEVOTHYROXINE SODIUM 25 MCG PO TABS: ORAL_TABLET | ORAL | 1 refills | Status: DC

## 2017-03-17 NOTE — Telephone Encounter (Addendum)
Copied from [place] [id number]. Topic: Quick Communication - See Telephone Encounter >> Mar 17, 2017  9:06 AM Ivar Drape wrote: CRM for notification. See Telephone encounter for:  03/17/17. Patient would like to know if her levothyroxine (SYNTHROID, LEVOTHROID) 25 MCG tablet [[id number]] medication has been sent to [place].  Ph [phone] Fax: [phone] Please call patient at [phone].

## 2017-03-17 NOTE — Telephone Encounter (Signed)
Pt requesting refill levothyroxine to [facility]; pt seen with thyroid labs 11/20/16. Refilled per protocol. Pt notified done. Pt still having problem with swelling and pt will cb when she has her calendar and schedule appt.

## 2017-03-30 ENCOUNTER — Ambulatory Visit (INDEPENDENT_AMBULATORY_CARE_PROVIDER_SITE_OTHER): Payer: Medicare Other

## 2017-03-30 DIAGNOSIS — Z7901 Long term (current) use of anticoagulants: Secondary | ICD-10-CM | POA: Diagnosis not present

## 2017-03-30 LAB — POCT INR: INR: 2.7

## 2017-03-30 NOTE — Patient Instructions (Signed)
INR today 2.7  Continue to take 1 pill (5mg ) daily EXCEPT for 1/2 pill (2.5mg ) on Mondays, Wednesdays and Fridays.  Recheck in 4 weeks.    Patient is doing well without any changes to diet or medications.  She will be having an upcoming hip replacement surgery but will communicate more details once she sees the orthopedist.  For now, will plan on routine follow up in 4 weeks.  Patient verbalizes understanding of all instructions given today.

## 2017-04-05 ENCOUNTER — Ambulatory Visit (INDEPENDENT_AMBULATORY_CARE_PROVIDER_SITE_OTHER): Payer: Medicare Other | Admitting: Orthopaedic Surgery

## 2017-04-05 ENCOUNTER — Encounter (INDEPENDENT_AMBULATORY_CARE_PROVIDER_SITE_OTHER): Payer: Self-pay | Admitting: Orthopaedic Surgery

## 2017-04-05 DIAGNOSIS — M1612 Unilateral primary osteoarthritis, left hip: Secondary | ICD-10-CM

## 2017-04-05 NOTE — Progress Notes (Signed)
Office Visit Note   Patient: [name]           Date of Birth: 11-22-1932           MRN: [id number] Visit Date: 04/05/2017              Requested by: Tower, Wynelle Fanny, MD [place], [place] [address] PCP: Abner Greenspan, MD   Assessment & Plan: Visit Diagnoses:  1. Unilateral primary osteoarthritis, left hip     Plan: At this point given the profound disabling pain that her arthritis is causing her left hip and how this is detrimentally affecting her life now she does wish to proceed with a total hip arthroplasty.  I will have her stop her Coumadin between 5 and 6 days before surgery.  We went over her shoulder x-rays and had a long and thorough discussion with her about the risk and benefits of this surgery.  All questions and concerns were answered and addressed.  We talked about her intraoperative and postoperative course and I showed her x-rays and went over a hip replacement model as well.  She does wish to have this scheduled in the near future we will work on doing so.  Follow-Up Instructions: Return for 2 weeks post-op.   Orders:  No orders of the defined types were placed in this encounter.  No orders of the defined types were placed in this encounter.     Procedures: No procedures performed   Clinical Data: No additional findings.   Subjective: Chief Complaint  Patient presents with  . Left Hip - Pain  The patient comes in today with known severe osteoarthritis of her left hip that she has been told is "bone-on-bone".  Has had hip pain for some years now but then when she fell a few months ago it is worsened dramatically since then.  It is 10 out of 10 at this point.  It is detrimentally affecting her active daily living, quality of life, mobility.  She has x-rays that accompany her as well.  She is someone who is on Coumadin as a blood thinner and can stop it for a few days before surgery without being bridged with Lovenox although she has been  on Lovenox in the past.  She is followed regularly by Dr. Cleda Mccreedy.  She does have a history of congestive heart failure.  She says all these are stable at this point.  She is also a patient of Dr. Glori Bickers who is her primary care physician.  She is tried and failed all forms of conservative treatment.  HPI  Review of Systems She currently denies any headache, chest pain, shortness of breath, fever, chills, nausea, vomiting.  Objective: Vital Signs: There were no vitals taken for this visit.  Physical Exam She is alert and oriented x3 and in no acute distress Ortho Exam Examination of her right hip is normal.  Examination of her left hip shows severe debilitating pain with any attempts of internal or external rotation to the point she will not let me even rotate her hip.  Her back knee foot and ankle exam are normal. Specialty Comments:  No specialty comments available.  Imaging: No results found. X-rays that accompany her there and reviewed reviewed show almost complete loss of superior lateral joint space of her left hip.  Her right hip appears normal and I was able to go through both hips show her the extent of the arthritic changes in her  left hip joint space narrowing, para-articular osteophytes and sclerotic changes.  PMFS History: Patient Active Problem List   Diagnosis Date Noted  . Long term (current) use of anticoagulants 03/04/2017  . Venous stasis dermatitis of both lower extremities 01/08/2017  . Impacted cerumen of right ear 11/20/2016  . Varicose veins of bilateral lower extremities with other complications 51/04/5850  . Osteopenia 10/25/2016  . Pedal edema 08/26/2016  . Varicose veins of both lower extremities 08/26/2016  . Estrogen deficiency 08/26/2016  . Screening mammogram, encounter for 08/26/2016  . Hemorrhoids 08/26/2016  . Epistaxis 01/07/2016  . Urticaria 08/22/2014  . Hip pain 08/02/2014  . Left knee pain 08/02/2014  . Chronic cough 05/08/2014  . Blood in  stool 04/09/2014  . Caregiver stress 08/16/2013  . Colon cancer screening 08/16/2013  . Encounter for therapeutic drug monitoring 04/20/2013  . Left ovarian cyst 03/14/2013  . (HFpEF) heart failure with preserved ejection fraction ([place]) 12/27/2012  . Cardiomyopathy, secondary --Resolved again 10/14 10/13/2010  . COLONIC POLYPS, ADENOMATOUS, HX OF 09/18/2009  . PULMONARY NODULE 12/20/2008  . GANGLION CYST 10/04/2007  . Hyperlipidemia 04/27/2007  . DEPRESSION 04/27/2007  . Asthma, mild intermittent 04/27/2007  . INSOMNIA 04/27/2007  . ADENOMATOUS COLONIC POLYP 11/04/2006  . Hypothyroidism 09/02/2006  . Atrial fibrillation ([place]) 08/05/2006   Past Medical History:  Diagnosis Date  . Allergic rhinitis   . 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 Arthritis   . Atrial fibrillation -persistent cardiologist-  dr klein/  primary EP -- dr Tawanna Sat ([facility])   a. s/p PVI [name] 2010;  b. on tikosyn/coumadin;  c. 05/2009 Echo: EF 60-65%, Gr 2 DD. (first dx 09/ 2007)  . Bilateral lower extremity edema   . Bleeding hemorrhoid   . Carotid stenosis    mild (hosp 3/11)- consult by vasc/ Dr Donnetta Hutching  . Complication of anesthesia    hard to wake  . Depression   . Diverticulosis of colon   . Fatty liver   . H/O cardiac radiofrequency ablation    01/ 2008 at [facility] /  03/ 2010  at [facility]  . Heart failure with preserved ejection fraction ([place])   . Heart murmur    "prior to valve repair"  . History of adenomatous polyp of colon    tubular adenoma's  . History of cardiomyopathy    secondary tachycardia-induced cardiomyopathy -- resolved 2014  . History of squamous cell carcinoma in situ (SCCIS) of skin    05/ 2017  nasal bridge and right medial knee  . History of transient ischemic attack (TIA)    01-24-2005 and 06-12-2009  . Hyperlipidemia   . Hypothyroidism   . Mild intermittent asthma    pulmologist-  dr Gwenette Greet  . Mixed stress and urge urinary incontinence   . Pulmonary nodule   .  S/P mitral valve repair 10-23-1998  dr Boyce Medici at [facility]   for MVP and regurg. (annuloplasty ring procedure)  . Varicose vein of leg   . Wears glasses     Family History  Problem Relation Age of Onset  . Hypertension Mother   . Lung cancer Father        smoker  . Alcohol abuse Father   . Cancer Father        bladder and lung CA smoker  . Breast cancer Neg Hx     Past Surgical History:  Procedure Laterality Date  . APPENDECTOMY  1978  . CARDIAC ELECTROPHYSIOLOGY [place] AND ABLATION  01/ 2008  at [facility]   right-sided ablation atrial flutter  . CARDIAC ELECTROPHYSIOLOGY STUDY AND ABLATION  03/ 2010   dr Jaymes Graff at [facility]   AV node ablation and pulmonary vein isolation for atrial fib  . CARDIOVERSION  06-18-2006;  07-13-2006;  10-19-2010;  10-27-2010  . COLONOSCOPY    . CYSTO/ TRANSURETHRAL COLLAGEN INJECTION THERAPY  07-26-2007   dr Matilde Sprang  . DILATION AND CURETTAGE OF UTERUS    . EXCISIONAL HEMORRHOIDECTOMY  1980s  . HEMORRHOID SURGERY N/A 10/29/2016   Procedure: HEMORRHOIDECTOMY;  Surgeon: Leighton Ruff, MD;  Location: [facility];  Service: General;  Laterality: N/A;  . MITRAL VALVE ANNULOPLASTY  10/23/1998   "Model [id number]; [employer] Lerner [id number]"; size 35mm; [facility]; Dr. Boyce Medici  . PILONIDAL CYST EXCISION  1954  . TEE WITH CARDIOVERSION  05-06-2006 at [facility];  01-02-2013 at [facility]  . TRANSTHORACIC ECHOCARDIOGRAM  05-01-2015   dr Caryl Comes   ef 50-55%/  mild AV sclerosis without stenosis/  post MV repair with mild central MR (valve area by pressure half-time 2cm^2,  valve area by continutity equation 0.91cm^2, peak grandiant 49mmHg)/  severe LAE/ mild TR/ mild RAE   . TUBAL LIGATION Bilateral 1978   Social History   Occupational History  . Occupation: Architectural technologist: RETIRED  Tobacco Use  . Smoking status: Never Smoker  . Smokeless tobacco: Never Used  Substance and Sexual Activity  . Alcohol use: Yes    Alcohol/week: 0.0 oz     Comment: seldom  . Drug use: No  . Sexual activity: No

## 2017-04-19 ENCOUNTER — Other Ambulatory Visit (INDEPENDENT_AMBULATORY_CARE_PROVIDER_SITE_OTHER): Payer: Self-pay | Admitting: Physician Assistant

## 2017-04-27 ENCOUNTER — Telehealth: Payer: Self-pay

## 2017-04-27 ENCOUNTER — Ambulatory Visit: Payer: Medicare Other

## 2017-04-27 ENCOUNTER — Telehealth: Payer: Self-pay | Admitting: Internal Medicine

## 2017-04-27 ENCOUNTER — Ambulatory Visit (INDEPENDENT_AMBULATORY_CARE_PROVIDER_SITE_OTHER): Payer: Medicare Other | Admitting: Family Medicine

## 2017-04-27 ENCOUNTER — Ambulatory Visit (INDEPENDENT_AMBULATORY_CARE_PROVIDER_SITE_OTHER): Payer: Medicare Other

## 2017-04-27 ENCOUNTER — Encounter: Payer: Self-pay | Admitting: Family Medicine

## 2017-04-27 VITALS — BP 124/68 | HR 86 | Temp 98.0°F | Ht 65.0 in | Wt 183.5 lb

## 2017-04-27 DIAGNOSIS — I83813 Varicose veins of bilateral lower extremities with pain: Secondary | ICD-10-CM | POA: Diagnosis not present

## 2017-04-27 DIAGNOSIS — Z7901 Long term (current) use of anticoagulants: Secondary | ICD-10-CM

## 2017-04-27 DIAGNOSIS — R600 Localized edema: Secondary | ICD-10-CM | POA: Diagnosis not present

## 2017-04-27 DIAGNOSIS — I509 Heart failure, unspecified: Secondary | ICD-10-CM | POA: Diagnosis not present

## 2017-04-27 DIAGNOSIS — G8929 Other chronic pain: Secondary | ICD-10-CM | POA: Diagnosis not present

## 2017-04-27 DIAGNOSIS — M25562 Pain in left knee: Secondary | ICD-10-CM

## 2017-04-27 DIAGNOSIS — I5030 Unspecified diastolic (congestive) heart failure: Secondary | ICD-10-CM | POA: Diagnosis not present

## 2017-04-27 DIAGNOSIS — I482 Chronic atrial fibrillation: Secondary | ICD-10-CM

## 2017-04-27 DIAGNOSIS — M1612 Unilateral primary osteoarthritis, left hip: Secondary | ICD-10-CM | POA: Diagnosis not present

## 2017-04-27 DIAGNOSIS — M169 Osteoarthritis of hip, unspecified: Secondary | ICD-10-CM | POA: Insufficient documentation

## 2017-04-27 DIAGNOSIS — E039 Hypothyroidism, unspecified: Secondary | ICD-10-CM | POA: Diagnosis not present

## 2017-04-27 DIAGNOSIS — I4821 Permanent atrial fibrillation: Secondary | ICD-10-CM

## 2017-04-27 LAB — POCT INR: INR: 2.8

## 2017-04-27 NOTE — Assessment & Plan Note (Signed)
After hip replacement- pt plans to tackle this with orthopedics  Improved gait may help

## 2017-04-27 NOTE — Telephone Encounter (Signed)
Patient notified me this am while in to [facility] that she will be having a hip replacement next week and needs instruction regarding hold and possible bridge.  I spoke with Dr. Trevor Mace office (orthopedic surgeon) to discuss his suggestions based on procedure.  He is requesting a 5-6 day hold on coumadin.  We will need to get clarification form PCP/Cardiologist regarding need for a bridge as patient has required in the past. Note: In June of 2018 Cardiologist mentions concern for holding without lovenox for more than 3 days.  I have left a message for Dr. Caryl Comes (Cardiologist) for his recommendations given last years concern.  Will forward to Dr. Glori Bickers in meantime.  Thanks.

## 2017-04-27 NOTE — Telephone Encounter (Signed)
I feel more comfortable hearing from cardiology first before making a game plan- keep me posted please  Thanks

## 2017-04-27 NOTE — Patient Instructions (Signed)
INR today 2.8  For now continue to take 1 pill (5mg ) daily EXCEPT for 1/2 pill (2.5mg ) on Mondays, Wednesdays and Fridays.  Will recheck after the procedure.  *Patient is having a hip replacement next week.  Will likely require a lovenox bridge.  I will discuss further with cardiologist and PCP regarding instructions and communicate dosing and follow up with patient later today.  Please refer to phone encounter note for details regarding lovenox bridge decision/plan.

## 2017-04-27 NOTE — Pre-Procedure Instructions (Signed)
[name]  04/27/2017      [id number] - [place], [place] - [address] [place] [address] Phone: [phone] Fax: [facility] - [place], [place] [address] Phone: [phone] Fax: [phone]    Your procedure is scheduled on Feb. 12  Report to [facility] Admitting at 10:30 A.M.  Call this number if you have problems the morning of surgery:  [phone]   Remember:  Do not eat food or drink liquids after midnight on Feb. 11   Take these medicines the morning of surgery with A SIP OF WATER : tylenol if needed,albuterol inhaler if needed-bring to hospital, cartia xt flonase if needed, hydrocodone if needed, levothyroxine (synthroid),loratadine (claritin) if needed,                 7 days prior to surgery STOP taking any Aspirin(unless otherwise instructed by your surgeon), Aleve, Naproxen, Ibuprofen, Motrin, Advil, Goody's, BC's, all herbal medications, fish oil, and all vitamins                 STOP COUMADIN/ WARFARIN PER DR. BLACKMAN   Do not wear jewelry, make-up or nail polish.  Do not wear lotions, powders, or perfumes, or deodorant.  Do not shave 48 hours prior to surgery.  Men may shave face and neck.  Do not bring valuables to the hospital.  [facility] is not responsible for any belongings or valuables.  Contacts, dentures or bridgework may not be worn into surgery.  Leave your suitcase in the car.  After surgery it may be brought to your room.  For patients admitted to the hospital, discharge time will be determined by your treatment team.  Patients discharged the day of surgery will not be allowed to drive home.    Special instructions:  [facility]- Preparing For Surgery  Before surgery, you can play an important role. Because skin is not sterile, your skin needs to be as free of germs as possible. You can reduce the number  of germs on your skin by washing with CHG (chlorahexidine gluconate) Soap before surgery.  CHG is an antiseptic cleaner which kills germs and bonds with the skin to continue killing germs even after washing.  Please do not use if you have an allergy to CHG or antibacterial soaps. If your skin becomes reddened/irritated stop using the CHG.  Do not shave (including legs and underarms) for at least 48 hours prior to first CHG shower. It is OK to shave your face.  Please follow these instructions carefully.   1. Shower the NIGHT BEFORE SURGERY and the MORNING OF SURGERY with CHG.   2. If you chose to wash your hair, wash your hair first as usual with your normal shampoo.  3. After you shampoo, rinse your hair and body thoroughly to remove the shampoo.  4. Use CHG as you would any other liquid soap. You can apply CHG directly to the skin and wash gently with a scrungie or a clean washcloth.   5. Apply the CHG Soap to your body ONLY FROM THE NECK DOWN.  Do not use on open wounds or open sores. Avoid contact with your eyes, ears, mouth and genitals (private parts). Wash Face and genitals (private parts)  with your normal soap.  6. Wash thoroughly, paying special attention to the area where your surgery will be performed.  7. Thoroughly rinse  your body with warm water from the neck down.  8. DO NOT shower/wash with your normal soap after using and rinsing off the CHG Soap.  9. Pat yourself dry with a CLEAN TOWEL.  10. Wear CLEAN PAJAMAS to bed the night before surgery, wear comfortable clothes the morning of surgery  11. Place CLEAN SHEETS on your bed the night of your first shower and DO NOT SLEEP WITH PETS.    Day of Surgery: Do not apply any deodorants/lotions. Please wear clean clothes to the hospital/surgery center.      Please read over the following fact sheets that you were given. Coughing and Deep Breathing, MRSA Information and Surgical Site Infection Prevention

## 2017-04-27 NOTE — Assessment & Plan Note (Signed)
Hypothyroidism  Pt has no clinical changes No change in energy level/ hair or skin/ edema and no tremor Lab Results  Component Value Date   TSH 3.23 11/20/2016

## 2017-04-27 NOTE — Progress Notes (Signed)
Subjective:    Patient ID: [name], female    DOB: 1932/04/27, 84 y.o.   MRN: [id number]  HPI Here for f/u of chronic medical problems/ leg swelling  Hx of venous stasis in both   Cannot get down to pull her support hose up  No procedures on veins since I saw her last   Pedal edema is about the same  Takes lasix every day (1/2 pill)     Wt Readings from Last 3 Encounters:  04/27/17 183 lb 8 oz (83.2 kg)  02/22/17 180 lb 4 oz (81.8 kg)  02/14/17 170 lb (77.1 kg)  has gained weight since November  Unsure if some is fluid Watching sodium  Has been going to water aerobics (cannot exercise her L hip)  30.54 kg/m   Hx of a fib and CHF with preserved EF / (cardiomyopathy that resolved)  As well as a fib  tx with lasix   Has seen Dr Kellie Simmering [facility]  Has had venous stasis dermatitis   Lab Results  Component Value Date   CREATININE 0.70 10/29/2016   BUN 21 (H) 10/29/2016   NA 142 10/29/2016   K 3.7 10/29/2016   CL 105 10/29/2016   CO2 31 08/26/2016   Lab Results  Component Value Date   ALT 14 08/26/2016   AST 19 08/26/2016   ALKPHOS 39 08/26/2016   BILITOT 0.8 08/26/2016   Lab Results  Component Value Date   WBC 6.1 08/26/2016   HGB 12.2 10/29/2016   HCT 36.0 10/29/2016   MCV 84.2 08/26/2016   PLT 168.0 08/26/2016    Lab Results  Component Value Date   TSH 3.23 11/20/2016     She has hip replacement planned (next week)  Dr Ninfa Linden is doing her surgery - she is anxious to get it over with  The fall in November brought it to light  Not using a cane- probably should be  Cannot sit in certain chairs /etc and cannot get comfortable to sleep  Needs a handicapped parking form   Will go to rehab center after surgery   She will go off of warfarin and bridge with lovenox   Then wants to have her L knee evaluated   BP Readings from Last 3 Encounters:  04/27/17 124/68  02/22/17 100/62  02/14/17 131/89   Pulse Readings from Last 3  Encounters:  04/27/17 86  02/22/17 94  02/14/17 79   Needs INR today   Patient Active Problem List   Diagnosis Date Noted  . Hip osteoarthritis 04/27/2017  . Long term (current) use of anticoagulants 03/04/2017  . Venous stasis dermatitis of both lower extremities 01/08/2017  . Impacted cerumen of right ear 11/20/2016  . Osteopenia 10/25/2016  . Pedal edema 08/26/2016  . Varicose veins of both lower extremities 08/26/2016  . Estrogen deficiency 08/26/2016  . Screening mammogram, encounter for 08/26/2016  . Hemorrhoids 08/26/2016  . Epistaxis 01/07/2016  . Urticaria 08/22/2014  . Hip pain 08/02/2014  . Left knee pain 08/02/2014  . Chronic cough 05/08/2014  . Caregiver stress 08/16/2013  . Colon cancer screening 08/16/2013  . Encounter for therapeutic drug monitoring 04/20/2013  . Left ovarian cyst 03/14/2013  . (HFpEF) heart failure with preserved ejection fraction ([place]) 12/27/2012  . Cardiomyopathy, secondary --Resolved again 10/14 10/13/2010  . COLONIC POLYPS, ADENOMATOUS, HX OF 09/18/2009  . PULMONARY NODULE 12/20/2008  . GANGLION CYST 10/04/2007  . Hyperlipidemia 04/27/2007  . DEPRESSION 04/27/2007  . Asthma,  mild intermittent 04/27/2007  . INSOMNIA 04/27/2007  . ADENOMATOUS COLONIC POLYP 11/04/2006  . Hypothyroidism 09/02/2006  . Atrial fibrillation ([place]) 08/05/2006   Past Medical History:  Diagnosis Date  . Allergic rhinitis   . 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 Arthritis   . Atrial fibrillation -persistent cardiologist-  dr klein/  primary EP -- dr Tawanna Sat ([facility])   a. s/p PVI [name] 2010;  b. on tikosyn/coumadin;  c. 05/2009 Echo: EF 60-65%, Gr 2 DD. (first dx 09/ 2007)  . Bilateral lower extremity edema   . Bleeding hemorrhoid   . Carotid stenosis    mild (hosp 3/11)- consult by vasc/ Dr Donnetta Hutching  . Complication of anesthesia    hard to wake  . Depression   . Diverticulosis of colon   . Fatty liver   . H/O cardiac radiofrequency ablation    01/ 2008 at  [facility] /  03/ 2010  at [facility]  . Heart failure with preserved ejection fraction ([place])   . Heart murmur    "prior to valve repair"  . History of adenomatous polyp of colon    tubular adenoma's  . History of cardiomyopathy    secondary tachycardia-induced cardiomyopathy -- resolved 2014  . History of squamous cell carcinoma in situ (SCCIS) of skin    05/ 2017  nasal bridge and right medial knee  . History of transient ischemic attack (TIA)    01-24-2005 and 06-12-2009  . Hyperlipidemia   . Hypothyroidism   . Mild intermittent asthma    pulmologist-  dr Gwenette Greet  . Mixed stress and urge urinary incontinence   . Pulmonary nodule   . S/P mitral valve repair 10-23-1998  dr Boyce Medici at [facility]   for MVP and regurg. (annuloplasty ring procedure)  . Varicose vein of leg   . Wears glasses    Past Surgical History:  Procedure Laterality Date  . APPENDECTOMY  1978  . CARDIAC ELECTROPHYSIOLOGY [place] AND ABLATION  01/ 2008    at [facility]   right-sided ablation atrial flutter  . CARDIAC ELECTROPHYSIOLOGY STUDY AND ABLATION  03/ 2010   dr Jaymes Graff at [facility]   AV node ablation and pulmonary vein isolation for atrial fib  . CARDIOVERSION  06-18-2006;  07-13-2006;  10-19-2010;  10-27-2010  . COLONOSCOPY    . CYSTO/ TRANSURETHRAL COLLAGEN INJECTION THERAPY  07-26-2007   dr Matilde Sprang  . DILATION AND CURETTAGE OF UTERUS    . EXCISIONAL HEMORRHOIDECTOMY  1980s  . HEMORRHOID SURGERY N/A 10/29/2016   Procedure: HEMORRHOIDECTOMY;  Surgeon: Leighton Ruff, MD;  Location: [facility];  Service: General;  Laterality: N/A;  . MITRAL VALVE ANNULOPLASTY  10/23/1998   "Model 4625; [employer] [id number]"; size 55mm; [facility]; Dr. Boyce Medici  . PILONIDAL CYST EXCISION  1954  . TEE WITH CARDIOVERSION  05-06-2006 at [facility];  01-02-2013 at [facility]  . TRANSTHORACIC ECHOCARDIOGRAM  05-01-2015   dr Caryl Comes   ef 50-55%/  mild AV sclerosis without stenosis/  post MV repair with  mild central MR (valve area by pressure half-time 2cm^2,  valve area by continutity equation 0.91cm^2, peak grandiant 36mmHg)/  severe LAE/ mild TR/ mild RAE   . TUBAL LIGATION Bilateral 1978   Social History   Tobacco Use  . Smoking status: Never Smoker  . Smokeless tobacco: Never Used  Substance Use Topics  . Alcohol use: Yes    Alcohol/week: 0.0 oz    Comment: seldom  . Drug use: No   Family  History  Problem Relation Age of Onset  . Hypertension Mother   . Lung cancer Father        smoker  . Alcohol abuse Father   . Cancer Father        bladder and lung CA smoker  . Breast cancer Neg Hx    Allergies  Allergen Reactions  . Amiodarone Hcl Swelling  . Penicillins Rash    Has patient had a PCN reaction causing immediate rash, facial/tongue/throat swelling, SOB or lightheadedness with hypotension: No Has patient had a PCN reaction causing severe rash involving mucus membranes or skin necrosis: No Has patient had a PCN reaction that required hospitalization:Patient was inpatient when reaction occurred Has patient had a PCN reaction occurring within the last 10 years: No If all of the above answers are "NO", then may proceed with Cephalosporin use.   . Statins Rash   Current Outpatient Medications on File Prior to Visit  Medication Sig Dispense Refill  . acetaminophen (TYLENOL) 500 MG tablet Take 500 mg by mouth at bedtime as needed (for pain.).    [name] albuterol (PROVENTIL HFA;VENTOLIN HFA) 108 (90 Base) MCG/ACT inhaler Inhale 2 puffs into the lungs every 4 (four) hours as needed for wheezing or shortness of breath. 1 Inhaler 0  . alendronate (FOSAMAX) 70 MG tablet Take 1 tablet (70 mg total) by mouth every 7 (seven) days. Take with a full glass of water on an empty stomach. (Patient taking differently: Take 70 mg by mouth every Sunday. Take with a full glass of water on an empty stomach.) 4 tablet 11  . CARTIA XT 180 MG 24 hr capsule TAKE 1 CAPSULE (180 MG TOTAL) BY MOUTH DAILY. 90  capsule 2  . Cholecalciferol (VITAMIN D3) 2000 units TABS Take 4,000 Units by mouth daily.    . fluticasone (FLONASE) 50 MCG/ACT nasal spray Place 1 spray into both nostrils daily as needed for allergies.    . furosemide (LASIX) 40 MG tablet TAKE 1/2 TABLET BY MOUTH DAILY, MAY INCREASE TO 1 TABLET BY MOUTH DAILY IF NEEDED (Patient taking differently: TAKE 0.5 TABLET (20 MG) BY MOUTH DAILY IN THE MORNING.) 90 tablet 2  . HYDROcodone-acetaminophen (NORCO/VICODIN) 5-325 MG tablet Take 1 tablet by mouth every 6 (six) hours as needed for moderate pain. 20 tablet 0  . levothyroxine (SYNTHROID, LEVOTHROID) 25 MCG tablet TAKE 1 TABLET EVERY DAY BEFORE BREAKFAST 90 tablet 1  . loratadine (CLARITIN) 10 MG tablet Take 10 mg by mouth daily as needed for allergies.     Vladimir Faster Glycol-Propyl Glycol (LUBRICANT EYE DROPS) 0.4-0.3 % SOLN Place 1-2 drops into both eyes 3 (three) times daily as needed (for dry eyes.).    [name] vitamin B-12 (CYANOCOBALAMIN) 1000 MCG tablet Take 1,000 mcg by mouth daily.    [name] warfarin (COUMADIN) 5 MG tablet TAKE AS DIRECTED BY ANTI COAGULATION CLINIC (Patient taking differently: Take 2.5-5 mg by mouth See admin instructions. TAKE 0.5 TABLET (2.5 MG) ON Monday, Wednesday, AND Friday. TAKE 1 TABLET (5 MG) ON ALL OTHER DAYS) 12 tablet 0   No current facility-administered medications on file prior to visit.      Review of Systems  Constitutional: Negative for activity change, appetite change, fatigue, fever and unexpected weight change.  HENT: Negative for congestion, ear pain, rhinorrhea, sinus pressure and sore throat.   Eyes: Negative for pain, redness and visual disturbance.  Respiratory: Negative for cough, shortness of breath and wheezing.   Cardiovascular: Positive for leg swelling. Negative for chest  pain and palpitations.       Baseline pedal edema   Gastrointestinal: Negative for abdominal pain, blood in stool, constipation and diarrhea.  Endocrine: Negative for polydipsia  and polyuria.  Genitourinary: Negative for dysuria, frequency and urgency.  Musculoskeletal: Positive for arthralgias. Negative for back pain and myalgias.       L knee and hip pain   Skin: Negative for pallor and rash.  Allergic/Immunologic: Negative for environmental allergies.  Neurological: Negative for dizziness, syncope and headaches.  Hematological: Negative for adenopathy. Does not bruise/bleed easily.  Psychiatric/Behavioral: Negative for decreased concentration and dysphoric mood. The patient is not nervous/anxious.        Objective:   Physical Exam  Constitutional: She appears well-developed and well-nourished. No distress.  overwt and well appearing   HENT:  Head: Normocephalic and atraumatic.  Mouth/Throat: Oropharynx is clear and moist.  Eyes: Conjunctivae and EOM are normal. Pupils are equal, round, and reactive to light.  Neck: Normal range of motion. Neck supple. No JVD present. Carotid bruit is not present. No thyromegaly present.  Cardiovascular: Normal rate, normal heart sounds and intact distal pulses. Exam reveals no gallop.  irreg irregular rhythm baseline with afib   Pulmonary/Chest: Effort normal and breath sounds normal. No respiratory distress. She has no wheezes. She has no rales.  No crackles  Good air exch  Abdominal: Soft. Bowel sounds are normal. She exhibits no distension, no abdominal bruit and no mass. There is no tenderness.  Musculoskeletal: She exhibits no edema.  Lymphadenopathy:    She has no cervical adenopathy.  Neurological: She is alert. She has normal reflexes. No cranial nerve deficit. She exhibits normal muscle tone. Coordination normal.  Skin: Skin is warm and dry. No rash noted. No pallor.  Psychiatric: She has a normal mood and affect.          Assessment & Plan:   Problem List Items Addressed This Visit      Cardiovascular and Mediastinum   (HFpEF) heart failure with preserved ejection fraction (HCC)    No clinical  changes  Has hip surgery next week- she will check in with cardiology (? If they have done clearance on her)  Overall stable and doing very well       Atrial fibrillation ([place])    Pt getting ready for hip surgery (replacement) next week with Dr Ninfa Linden INR today-unsure who is supposed to make the bridge plan (she will call ortho and cardiology)        Varicose veins of both lower extremities    Not wearing supp hose due to inability to get them on with severe hip OA For hip surg next week  Imagine that they will put them on for her in hosp/rehab  Then when improved she should be able to do herself  She plans then to f/u with Dr Kellie Simmering at the vein clinic Her venous stasis dermatitis is stable currently         Endocrine   Hypothyroidism    Hypothyroidism  Pt has no clinical changes No change in energy level/ hair or skin/ edema and no tremor Lab Results  Component Value Date   TSH 3.23 11/20/2016            Musculoskeletal and Integument   Hip osteoarthritis    Severe in L hip  For hip replacement next week Adv to use a cane for safety         Other   Left knee pain  After hip replacement- pt plans to tackle this with orthopedics  Improved gait may help      Pedal edema - Primary    Multifactorial with venous stasis Hx of CHF that is not bothersome  Cannot put supp hose on due to hip OA (but having hip repl) She plans on f/u with vein clinic after she is healed to disc procedure        Other Visit Diagnoses    Congestive heart failure, unspecified HF chronicity, unspecified heart failure type ([place])   (Chronic)

## 2017-04-27 NOTE — Assessment & Plan Note (Signed)
Pt getting ready for hip surgery (replacement) next week with Dr Ninfa Linden INR today-unsure who is supposed to make the bridge plan (she will call ortho and cardiology)

## 2017-04-27 NOTE — Telephone Encounter (Signed)
° °  [facility] Pre-operative Risk Assessment    Request for surgical clearance:  1. What type of surgery is being performed? Hip replacement  2. When is this surgery scheduled? 05-04-17  3. What type of clearance is required (medical clearance vs. Pharmacy clearance to hold med vs. Both)? Pharmacy clearance to hold Coumadin   4. Are there any medications that need to be held prior to surgery and how long? 5-6 day hold on coumadin prior to surgery? Does patient need Lovenox bridge?  5. Practice name and name of physician performing surgery? Dr. Ninfa Linden   6. What is your office phone and fax number? Office# [phone] fax [phone]  7. Anesthesia type (None, local, MAC, general) ? mandy did not know   [name] 04/27/2017, 10:16 AM  _________________________________________________________________   (provider comments below)

## 2017-04-27 NOTE — Patient Instructions (Signed)
INR today  Good luck with your surgery  Check in with cardiology regarding surgery   When done-hope you will be able to wear support stockings again

## 2017-04-27 NOTE — Assessment & Plan Note (Signed)
No clinical changes  Has hip surgery next week- she will check in with cardiology (? If they have done clearance on her)  Overall stable and doing very well

## 2017-04-27 NOTE — Assessment & Plan Note (Signed)
Severe in L hip  For hip replacement next week Adv to use a cane for safety

## 2017-04-27 NOTE — Assessment & Plan Note (Signed)
Not wearing supp hose due to inability to get them on with severe hip OA For hip surg next week  Imagine that they will put them on for her in hosp/rehab  Then when improved she should be able to do herself  She plans then to f/u with Dr Kellie Simmering at the vein clinic Her venous stasis dermatitis is stable currently

## 2017-04-27 NOTE — Assessment & Plan Note (Signed)
INR today  Unsure when she is to bridge for her upcoming surgery

## 2017-04-27 NOTE — Assessment & Plan Note (Signed)
Multifactorial with venous stasis Hx of CHF that is not bothersome  Cannot put supp hose on due to hip OA (but having hip repl) She plans on f/u with vein clinic after she is healed to disc procedure

## 2017-04-28 ENCOUNTER — Encounter (HOSPITAL_COMMUNITY)
Admission: RE | Admit: 2017-04-28 | Discharge: 2017-04-28 | Disposition: A | Discharge status: Home / Self Care | Payer: Medicare Other | Source: Ambulatory Visit | Attending: Orthopaedic Surgery | Admitting: Orthopaedic Surgery

## 2017-04-28 ENCOUNTER — Encounter (HOSPITAL_COMMUNITY): Payer: Self-pay

## 2017-04-28 ENCOUNTER — Other Ambulatory Visit: Payer: Self-pay

## 2017-04-28 DIAGNOSIS — Z01812 Encounter for preprocedural laboratory examination: Secondary | ICD-10-CM | POA: Insufficient documentation

## 2017-04-28 DIAGNOSIS — K760 Fatty (change of) liver, not elsewhere classified: Secondary | ICD-10-CM | POA: Insufficient documentation

## 2017-04-28 DIAGNOSIS — Z85828 Personal history of other malignant neoplasm of skin: Secondary | ICD-10-CM | POA: Diagnosis not present

## 2017-04-28 DIAGNOSIS — L659 Nonscarring hair loss, unspecified: Secondary | ICD-10-CM | POA: Insufficient documentation

## 2017-04-28 DIAGNOSIS — E669 Obesity, unspecified: Secondary | ICD-10-CM | POA: Insufficient documentation

## 2017-04-28 DIAGNOSIS — E039 Hypothyroidism, unspecified: Secondary | ICD-10-CM | POA: Diagnosis not present

## 2017-04-28 DIAGNOSIS — Z7901 Long term (current) use of anticoagulants: Secondary | ICD-10-CM | POA: Insufficient documentation

## 2017-04-28 DIAGNOSIS — Z9889 Other specified postprocedural states: Secondary | ICD-10-CM | POA: Insufficient documentation

## 2017-04-28 DIAGNOSIS — I4891 Unspecified atrial fibrillation: Secondary | ICD-10-CM | POA: Insufficient documentation

## 2017-04-28 DIAGNOSIS — R600 Localized edema: Secondary | ICD-10-CM | POA: Insufficient documentation

## 2017-04-28 DIAGNOSIS — Z79899 Other long term (current) drug therapy: Secondary | ICD-10-CM | POA: Insufficient documentation

## 2017-04-28 DIAGNOSIS — Z6830 Body mass index (BMI) 30.0-30.9, adult: Secondary | ICD-10-CM | POA: Diagnosis not present

## 2017-04-28 DIAGNOSIS — R911 Solitary pulmonary nodule: Secondary | ICD-10-CM | POA: Insufficient documentation

## 2017-04-28 DIAGNOSIS — I8390 Asymptomatic varicose veins of unspecified lower extremity: Secondary | ICD-10-CM | POA: Diagnosis not present

## 2017-04-28 DIAGNOSIS — I6529 Occlusion and stenosis of unspecified carotid artery: Secondary | ICD-10-CM | POA: Insufficient documentation

## 2017-04-28 DIAGNOSIS — R011 Cardiac murmur, unspecified: Secondary | ICD-10-CM | POA: Diagnosis not present

## 2017-04-28 DIAGNOSIS — E785 Hyperlipidemia, unspecified: Secondary | ICD-10-CM | POA: Diagnosis not present

## 2017-04-28 DIAGNOSIS — I428 Other cardiomyopathies: Secondary | ICD-10-CM | POA: Insufficient documentation

## 2017-04-28 HISTORY — DX: Dyspnea, unspecified: R06.00

## 2017-04-28 HISTORY — DX: Other specified soft tissue disorders: M79.89

## 2017-04-28 HISTORY — DX: Cardiac arrhythmia, unspecified: I49.9

## 2017-04-28 LAB — BASIC METABOLIC PANEL
Anion gap: 10 (ref 5–15)
BUN: 21 mg/dL — ABNORMAL HIGH (ref 6–20)
CO2: 27 mmol/L (ref 22–32)
CREATININE: 0.79 mg/dL (ref 0.44–1.00)
Calcium: 9.2 mg/dL (ref 8.9–10.3)
Chloride: 104 mmol/L (ref 101–111)
GLUCOSE: 93 mg/dL (ref 65–99)
Potassium: 4.1 mmol/L (ref 3.5–5.1)
Sodium: 141 mmol/L (ref 135–145)

## 2017-04-28 LAB — SURGICAL PCR SCREEN
MRSA, PCR: NEGATIVE
Staphylococcus aureus: NEGATIVE

## 2017-04-28 LAB — CBC
HCT: 41.2 % (ref 36.0–46.0)
Hemoglobin: 13.2 g/dL (ref 12.0–15.0)
MCH: 27.8 pg (ref 26.0–34.0)
MCHC: 32 g/dL (ref 30.0–36.0)
MCV: 86.9 fL (ref 78.0–100.0)
PLATELETS: 143 10*3/uL — AB (ref 150–400)
RBC: 4.74 MIL/uL (ref 3.87–5.11)
RDW: 14.9 % (ref 11.5–15.5)
WBC: 4.2 10*3/uL (ref 4.0–10.5)

## 2017-04-28 MED ORDER — ENOXAPARIN SODIUM 80 MG/0.8ML ~~LOC~~ SOLN: 80.0000 mg | Freq: Two times a day (BID) | SUBCUTANEOUS | 0 refills | Status: DC

## 2017-04-28 NOTE — Progress Notes (Signed)
PCP: Dr. [name]  Cardiologist: Dr. Mariane Duval. Last dose coumadin 04/27/17. Pt. Reports Dr. Olin Pia office to get back in touch with her about starting lovenox injections. Informd pt. If she doesn't here from them today to call his office. Pt. Verbalizes understanding.

## 2017-04-28 NOTE — Telephone Encounter (Signed)
Patient notified by phone and written instructions for bridge plan sent via mychart.  Also, r/x sent in to pharmacy for Lovenox.  Thanks.

## 2017-04-28 NOTE — Telephone Encounter (Signed)
Patient will require Lovenox bridge:  80mg  [place] bid for hip replacement next week.  Will send in enough to last until surgery as patient will discharge to a rehab facility and receive medication there.  Thanks.

## 2017-04-28 NOTE — Telephone Encounter (Signed)
Cardiology recommends 5 day hold with bridge plan:  Recommendations as follows: Dr. Glori Bickers please advise if okay with written plan.    Patients creatinine clearance:   85  And no artificial heart valve.   Okay for (1mg /kg q12 hours) on Lovenox dosing.  (patient is 83.2 kg) should be okay for Lovenox 80mg  [place] bid.    04/28/17:  Take last dose of coumadin 04/29/17:  No Coumadin and No Lovenox 04/30/17:  Start Lovenox 80mg  in the morning (take 12 hours a part, example 8am and 8pm) consistent every day.                       Lovenox 80mg  in the evening 05/01/17:    Lovenox 80mg  am                 Lovenox 80mg  pm 05/02/17:  Lovenox 80mg  am                Lovenox 80mg  pm 05/03/17:  TAKE LOVENOX 80MG  AM ONLY!!!! DO NOT TAKE THE PM DOSE ON THIS DAY  05/04/17:  PROCEDURE - NO LOVENOX NO COUMADIN (Patient will be in the hospital at this point) and then discharge to facility for management.  Do recommend the following: But note patient will be following under hospital and Rehab facility at this point.   05/05/17:  Restart Lovenox 80mg  every am and every pm                Restart Coumadin 5mg  in the pm 05/06/17:  Lovenox 80mg  am and pm                Coumadin 7.5mg  05/07/17: Recheck INR  **Continue on Lovenox until reaches therapeutic range (2.0-3.0).

## 2017-04-28 NOTE — Progress Notes (Signed)
Anesthesia Chart Review: Patient is a 84 year old female scheduled for left THA, anterior approach on 05/04/17 by Dr. Jean Rosenthal.  History includes never smoker, atrial fibrillation (s/p radiofrequency ablation 03/2006, DCCV 05/06/06, 06/18/06), tachycardia induced cardiomyopathy (EF 55-60% 11/2016), murmur/MVP with MR (s/p MV repair with annuloplasty ring 10/23/98), TIA ('06, '11), hypothyroidism, lower extremity edema, varicose veins, carotid stenosis (mild by neck CTA 2011), pulmonary nodule (added to history 7//19/12; however 02/28/13 chest CT showed no lung nodules), hyperlipidemia, fatty liver, skin cancer (SCCIS), alopecia (due to b-blocker), hemorrhoidectomy 10/29/16. BMI is consistent with mild obesity.   - PCP is Dr. [name]. She is aware of surgery plans. - Cardiologist is Dr. Virl Axe. Last visit 11/20/16. Per 04/27/17 telephone encounter by Fabian Sharp, PA, " [name] was last seen on 11/20/16 by Dr. Caryl Comes.  Since that day, ALEK PONCEDELEON has done well. She states that she has climbed mountains in [place] and [place] last fall. She can do more than 4.0 METS. She does not have a history of CAD, echo 2018 with normal LVEF.  Therefore, based on ACC/AHA guidelines, the patient would be at acceptable risk for the planned procedure without further cardiovascular testing.  I will forward this to pharmacy to make recommendations for holding coumadin and bridging with lovenox."  Meds include albuterol, Fosamax, Cartia XT, Flonase, Lasix, Norco, levothyroxine, Claritin, warfarin (last dose 04/27/17). Per Lovenox bridge instructions, she is to start Lovenox on 04/30/17 with last pre-op dose 05/03/17 AM.  BP 112/77   Pulse 100   Temp 36.7 C   Resp 20   Ht 5\' 5"  (1.651 m)   Wt 184 lb 3.2 oz (83.6 kg)   SpO2 96%   BMI 30.65 kg/m   EKG 11/20/16: Afib at 86 bpm with premature aberrantly conducted complexes. Incomplete RBBB.  Echo 12/01/16: Study Conclusions - Left ventricle: The cavity size  was normal. Wall thickness was   normal. Systolic function was normal. The estimated ejection   fraction was in the range of 55% to 60%. Wall motion was normal;   there were no regional wall motion abnormalities. - Aortic valve: Trileaflet; mildly thickened, mildly calcified   leaflets. - Mitral valve: Mitral valve repair, annuloplasty ring. There was   mild regurgitation. Mean gradient (D): 8 mm Hg. Valve area by   pressure half-time: 2.42 cm^2. - Left atrium: The atrium was severely dilated. - Tricuspid valve: There was trivial regurgitation. - Pulmonary arteries: Systolic pressure was mildly increased. PA pressure 30 mmHg.  Remote history of stress and cardiac cath. MV repair was in 2000. ETT 08/24/07.  Spirometry 05/08/14: FVC 2.10 (73%), FEV1 1.42 (68%), FEV1/FVC 68% (93%), FEF 25-75% 0.80 (50%). Mild airflow obstruction, mildly decreased FVC either due to airtrapping or restriction.   Preoperative labs noted. She is for a PT/INR on the morning of surgery.   If no acute changes then I would anticipate that she can proceed as planned.  George Hugh [facility] Stay [facility]/Anesthesiology Phone [phone] 04/29/2017 9:44 AM

## 2017-04-28 NOTE — Telephone Encounter (Signed)
Sounds good thanks

## 2017-04-28 NOTE — Telephone Encounter (Signed)
Patient with diagnosis of warfarin  on Atrial fibrillation for anticoagulation.    Procedure: Hip Replacement Date of procedure: 05/04/2017  CHADS2-VASc score of  6 (CHF, AGE x 2,  stroke/tia x 2, female)  Per office protocol, patient can hold warfarin for 5 days prior to procedure.  Patient will need bridging with Lovenox (enoxaparin) around procedure.   For orthopedic procedures please be sure to resume therapeutic (not prophylactic) dosing.   **COUMADIN follow up at [facility]** Need to contact [facility] to coordinate bridging therapy*   Bryanda Mikel Rodriguez-Guzman PharmD, BCPS, [place] [address] [place] [address] 04/28/2017 1:30 PM

## 2017-04-28 NOTE — Telephone Encounter (Signed)
Please advise-for coumadin to Lovenox bridging. Please read prior messages. Waiting to hear from Ozzie Hoyle, LPN.

## 2017-04-28 NOTE — Telephone Encounter (Signed)
Noted.  Please refer to lovenox bridging plan in phone message from 04/27/17 with [facility] office ([place]).  Thanks to everyone involved.

## 2017-04-28 NOTE — Telephone Encounter (Signed)
Please relay pharmacy recommendations to patient and surgeon's office. Patient needs to contact [facility] to coordinate bridging therapy.

## 2017-04-28 NOTE — Telephone Encounter (Signed)
   Primary Cardiologist: No primary care provider on file.  Chart reviewed as part of pre-operative protocol coverage. Patient was contacted 04/28/2017 in reference to pre-operative risk assessment for pending surgery as outlined below.  [name] was last seen on 11/20/16 by Dr. Caryl Comes.  Since that day, [name] has done well. She states that she has climbed mountains in [place] and [place] last fall. She can do more than 4.0 METS. She does not have a history of CAD, echo 2018 with normal LVEF.   Therefore, based on ACC/AHA guidelines, the patient would be at acceptable risk for the planned procedure without further cardiovascular testing.   I will forward this to pharmacy to make recommendations for holding coumadin and bridging with lovenox.   Please call with questions.  [place], PA 04/28/2017, 1:09 PM

## 2017-04-28 NOTE — Telephone Encounter (Signed)
Great, thanks

## 2017-04-30 ENCOUNTER — Other Ambulatory Visit: Payer: Self-pay | Admitting: Orthopaedic Surgery

## 2017-05-03 MED ORDER — CLINDAMYCIN PHOSPHATE 900 MG/50ML IV SOLN
900.0000 mg | INTRAVENOUS | Status: AC
  Administered 2017-05-04: 900 mg via INTRAVENOUS
  Filled 2017-05-03: qty 50

## 2017-05-03 MED ORDER — TRANEXAMIC ACID 1000 MG/10ML IV SOLN
1000.0000 mg | INTRAVENOUS | Status: AC
  Administered 2017-05-04: 1000 mg via INTRAVENOUS
  Filled 2017-05-03: qty 1100

## 2017-05-04 ENCOUNTER — Inpatient Hospital Stay (HOSPITAL_COMMUNITY): Payer: Medicare Other

## 2017-05-04 ENCOUNTER — Inpatient Hospital Stay (HOSPITAL_COMMUNITY): Payer: Medicare Other | Admitting: Vascular Surgery

## 2017-05-04 ENCOUNTER — Other Ambulatory Visit: Payer: Self-pay

## 2017-05-04 ENCOUNTER — Inpatient Hospital Stay (HOSPITAL_COMMUNITY)
Admission: RE | Admit: 2017-05-04 | Discharge: 2017-05-07 | DRG: 470 | Disposition: A | Discharge status: Skilled Nursing Facility | Payer: Medicare Other | Source: Ambulatory Visit | Attending: Orthopaedic Surgery | Admitting: Orthopaedic Surgery

## 2017-05-04 ENCOUNTER — Encounter (HOSPITAL_COMMUNITY)
Admission: RE | Disposition: A | Discharge status: Skilled Nursing Facility | Payer: Self-pay | Source: Ambulatory Visit | Attending: Orthopaedic Surgery

## 2017-05-04 ENCOUNTER — Encounter (HOSPITAL_COMMUNITY): Payer: Self-pay | Admitting: General Practice

## 2017-05-04 DIAGNOSIS — Z880 Allergy status to penicillin: Secondary | ICD-10-CM

## 2017-05-04 DIAGNOSIS — Z96642 Presence of left artificial hip joint: Secondary | ICD-10-CM

## 2017-05-04 DIAGNOSIS — D62 Acute posthemorrhagic anemia: Secondary | ICD-10-CM | POA: Diagnosis not present

## 2017-05-04 DIAGNOSIS — I4891 Unspecified atrial fibrillation: Secondary | ICD-10-CM | POA: Diagnosis not present

## 2017-05-04 DIAGNOSIS — W19XXXA Unspecified fall, initial encounter: Secondary | ICD-10-CM | POA: Diagnosis present

## 2017-05-04 DIAGNOSIS — Z952 Presence of prosthetic heart valve: Secondary | ICD-10-CM | POA: Diagnosis not present

## 2017-05-04 DIAGNOSIS — Z7901 Long term (current) use of anticoagulants: Secondary | ICD-10-CM

## 2017-05-04 DIAGNOSIS — Z888 Allergy status to other drugs, medicaments and biological substances status: Secondary | ICD-10-CM | POA: Diagnosis not present

## 2017-05-04 DIAGNOSIS — Z4789 Encounter for other orthopedic aftercare: Secondary | ICD-10-CM | POA: Diagnosis not present

## 2017-05-04 DIAGNOSIS — I5032 Chronic diastolic (congestive) heart failure: Secondary | ICD-10-CM | POA: Diagnosis present

## 2017-05-04 DIAGNOSIS — M1612 Unilateral primary osteoarthritis, left hip: Secondary | ICD-10-CM | POA: Diagnosis not present

## 2017-05-04 DIAGNOSIS — E785 Hyperlipidemia, unspecified: Secondary | ICD-10-CM | POA: Diagnosis not present

## 2017-05-04 DIAGNOSIS — E039 Hypothyroidism, unspecified: Secondary | ICD-10-CM | POA: Diagnosis present

## 2017-05-04 DIAGNOSIS — Z8673 Personal history of transient ischemic attack (TIA), and cerebral infarction without residual deficits: Secondary | ICD-10-CM

## 2017-05-04 DIAGNOSIS — S79911A Unspecified injury of right hip, initial encounter: Secondary | ICD-10-CM | POA: Diagnosis not present

## 2017-05-04 DIAGNOSIS — Z419 Encounter for procedure for purposes other than remedying health state, unspecified: Secondary | ICD-10-CM

## 2017-05-04 DIAGNOSIS — M8580 Other specified disorders of bone density and structure, unspecified site: Secondary | ICD-10-CM | POA: Diagnosis present

## 2017-05-04 DIAGNOSIS — G8911 Acute pain due to trauma: Secondary | ICD-10-CM | POA: Diagnosis not present

## 2017-05-04 DIAGNOSIS — Z471 Aftercare following joint replacement surgery: Secondary | ICD-10-CM | POA: Diagnosis not present

## 2017-05-04 DIAGNOSIS — I481 Persistent atrial fibrillation: Secondary | ICD-10-CM | POA: Diagnosis not present

## 2017-05-04 HISTORY — PX: TOTAL HIP ARTHROPLASTY: SHX124

## 2017-05-04 HISTORY — DX: Presence of left artificial hip joint: Z96.642

## 2017-05-04 LAB — PROTIME-INR
INR: 1.15
Prothrombin Time: 14.6 seconds (ref 11.4–15.2)

## 2017-05-04 SURGERY — ARTHROPLASTY, HIP, TOTAL, ANTERIOR APPROACH
Anesthesia: Monitor Anesthesia Care | Site: Hip | Laterality: Left

## 2017-05-04 MED ORDER — ONDANSETRON HCL 4 MG/2ML IJ SOLN
4.0000 mg | Freq: Four times a day (QID) | INTRAMUSCULAR | Status: DC | PRN
  Administered 2017-05-06 (×2): 4 mg via INTRAVENOUS
  Filled 2017-05-04 (×2): qty 2

## 2017-05-04 MED ORDER — CHLORHEXIDINE GLUCONATE 4 % EX LIQD: 60.0000 mL | Freq: Once | CUTANEOUS | Status: DC

## 2017-05-04 MED ORDER — ALBUTEROL SULFATE (2.5 MG/3ML) 0.083% IN NEBU: 3.0000 mL | INHALATION_SOLUTION | RESPIRATORY_TRACT | Status: DC | PRN

## 2017-05-04 MED ORDER — DEXAMETHASONE SODIUM PHOSPHATE 10 MG/ML IJ SOLN
INTRAMUSCULAR | Status: DC | PRN
  Administered 2017-05-04: 5 mg via INTRAVENOUS

## 2017-05-04 MED ORDER — DOCUSATE SODIUM 100 MG PO CAPS
100.0000 mg | ORAL_CAPSULE | Freq: Two times a day (BID) | ORAL | Status: DC
  Administered 2017-05-05 – 2017-05-07 (×5): 100 mg via ORAL
  Filled 2017-05-04 (×5): qty 1

## 2017-05-04 MED ORDER — PROPOFOL 10 MG/ML IV BOLUS
INTRAVENOUS | Status: AC
  Filled 2017-05-04: qty 20

## 2017-05-04 MED ORDER — ALUM & MAG HYDROXIDE-SIMETH 200-200-20 MG/5ML PO SUSP: 30.0000 mL | ORAL | Status: DC | PRN

## 2017-05-04 MED ORDER — ONDANSETRON HCL 4 MG/2ML IJ SOLN
INTRAMUSCULAR | Status: DC | PRN
  Administered 2017-05-04: 4 mg via INTRAVENOUS

## 2017-05-04 MED ORDER — LEVOTHYROXINE SODIUM 25 MCG PO TABS
25.0000 ug | ORAL_TABLET | Freq: Every day | ORAL | Status: DC
  Administered 2017-05-05 – 2017-05-07 (×3): 25 ug via ORAL
  Filled 2017-05-04 (×3): qty 1

## 2017-05-04 MED ORDER — DILTIAZEM HCL ER COATED BEADS 180 MG PO CP24
180.0000 mg | ORAL_CAPSULE | Freq: Every day | ORAL | Status: DC
  Administered 2017-05-05 – 2017-05-07 (×3): 180 mg via ORAL
  Filled 2017-05-04 (×3): qty 1

## 2017-05-04 MED ORDER — SODIUM CHLORIDE 0.9 % IV SOLN
INTRAVENOUS | Status: DC
  Administered 2017-05-04 – 2017-05-06 (×2): via INTRAVENOUS

## 2017-05-04 MED ORDER — WARFARIN - PHARMACIST DOSING INPATIENT
Freq: Every day | Status: DC
  Administered 2017-05-06: 17:00:00

## 2017-05-04 MED ORDER — POLYETHYL GLYCOL-PROPYL GLYCOL 0.4-0.3 % OP SOLN: 1.0000 [drp] | Freq: Three times a day (TID) | OPHTHALMIC | Status: DC | PRN

## 2017-05-04 MED ORDER — HYDROMORPHONE HCL 1 MG/ML IJ SOLN
INTRAMUSCULAR | Status: AC
  Filled 2017-05-04: qty 1

## 2017-05-04 MED ORDER — 0.9 % SODIUM CHLORIDE (POUR BTL) OPTIME
TOPICAL | Status: DC | PRN
  Administered 2017-05-04: 1000 mL

## 2017-05-04 MED ORDER — ALBUMIN HUMAN 5 % IV SOLN
12.5000 g | Freq: Once | INTRAVENOUS | Status: AC
  Administered 2017-05-04: 12.5 g via INTRAVENOUS

## 2017-05-04 MED ORDER — CLINDAMYCIN PHOSPHATE 600 MG/50ML IV SOLN
600.0000 mg | Freq: Four times a day (QID) | INTRAVENOUS | Status: AC
  Administered 2017-05-04 – 2017-05-05 (×2): 600 mg via INTRAVENOUS
  Filled 2017-05-04 (×3): qty 50

## 2017-05-04 MED ORDER — VITAMIN D 1000 UNITS PO TABS
4000.0000 [IU] | ORAL_TABLET | Freq: Every day | ORAL | Status: DC
  Administered 2017-05-05 – 2017-05-07 (×4): 4000 [IU] via ORAL
  Filled 2017-05-04 (×3): qty 4

## 2017-05-04 MED ORDER — PHENYLEPHRINE HCL 10 MG/ML IJ SOLN
INTRAVENOUS | Status: DC | PRN
  Administered 2017-05-04: 25 ug/min via INTRAVENOUS

## 2017-05-04 MED ORDER — ACETAMINOPHEN 650 MG RE SUPP
650.0000 mg | RECTAL | Status: DC | PRN
Start: 2017-05-04 — End: 2017-05-07

## 2017-05-04 MED ORDER — DEXAMETHASONE SODIUM PHOSPHATE 10 MG/ML IJ SOLN
INTRAMUSCULAR | Status: AC
  Filled 2017-05-04: qty 1

## 2017-05-04 MED ORDER — VITAMIN B-12 1000 MCG PO TABS
1000.0000 ug | ORAL_TABLET | Freq: Every day | ORAL | Status: DC
  Administered 2017-05-05 – 2017-05-07 (×3): 1000 ug via ORAL
  Filled 2017-05-04 (×3): qty 1

## 2017-05-04 MED ORDER — ONDANSETRON HCL 4 MG PO TABS
4.0000 mg | ORAL_TABLET | Freq: Four times a day (QID) | ORAL | Status: DC | PRN
  Administered 2017-05-07: 4 mg via ORAL
  Filled 2017-05-04: qty 1

## 2017-05-04 MED ORDER — OXYCODONE HCL 5 MG PO TABS
ORAL_TABLET | ORAL | Status: AC
  Filled 2017-05-04: qty 1

## 2017-05-04 MED ORDER — METHOCARBAMOL 500 MG PO TABS
500.0000 mg | ORAL_TABLET | Freq: Four times a day (QID) | ORAL | Status: DC | PRN
  Administered 2017-05-05 – 2017-05-06 (×3): 500 mg via ORAL
  Filled 2017-05-04 (×3): qty 1

## 2017-05-04 MED ORDER — PHENYLEPHRINE 40 MCG/ML (10ML) SYRINGE FOR IV PUSH (FOR BLOOD PRESSURE SUPPORT)
PREFILLED_SYRINGE | INTRAVENOUS | Status: AC
  Filled 2017-05-04: qty 10

## 2017-05-04 MED ORDER — ONDANSETRON HCL 4 MG/2ML IJ SOLN
INTRAMUSCULAR | Status: AC
  Filled 2017-05-04: qty 2

## 2017-05-04 MED ORDER — HYDROMORPHONE HCL 1 MG/ML IJ SOLN
0.2500 mg | INTRAMUSCULAR | Status: DC | PRN
  Administered 2017-05-04 (×2): 0.5 mg via INTRAVENOUS

## 2017-05-04 MED ORDER — METOCLOPRAMIDE HCL 5 MG/ML IJ SOLN
5.0000 mg | Freq: Three times a day (TID) | INTRAMUSCULAR | Status: DC | PRN
  Administered 2017-05-06: 10 mg via INTRAVENOUS
  Filled 2017-05-04: qty 2

## 2017-05-04 MED ORDER — WARFARIN SODIUM 7.5 MG PO TABS
7.5000 mg | ORAL_TABLET | Freq: Once | ORAL | Status: AC
Start: 2017-05-04 — End: 2017-05-04
  Administered 2017-05-04: 7.5 mg via ORAL
  Filled 2017-05-04: qty 1

## 2017-05-04 MED ORDER — LORATADINE 10 MG PO TABS: 10.0000 mg | ORAL_TABLET | Freq: Every day | ORAL | Status: DC | PRN

## 2017-05-04 MED ORDER — ALBUMIN HUMAN 5 % IV SOLN
INTRAVENOUS | Status: AC
  Filled 2017-05-04: qty 250

## 2017-05-04 MED ORDER — PROPOFOL 500 MG/50ML IV EMUL
INTRAVENOUS | Status: DC | PRN
  Administered 2017-05-04: 65 ug/kg/min via INTRAVENOUS

## 2017-05-04 MED ORDER — FENTANYL CITRATE (PF) 250 MCG/5ML IJ SOLN
INTRAMUSCULAR | Status: AC
  Filled 2017-05-04: qty 5

## 2017-05-04 MED ORDER — FLUTICASONE PROPIONATE 50 MCG/ACT NA SUSP: 1.0000 | Freq: Every day | NASAL | Status: DC | PRN

## 2017-05-04 MED ORDER — METOCLOPRAMIDE HCL 5 MG PO TABS: 5.0000 mg | ORAL_TABLET | Freq: Three times a day (TID) | ORAL | Status: DC | PRN

## 2017-05-04 MED ORDER — PROMETHAZINE HCL 25 MG/ML IJ SOLN: 6.2500 mg | INTRAMUSCULAR | Status: DC | PRN

## 2017-05-04 MED ORDER — FENTANYL CITRATE (PF) 250 MCG/5ML IJ SOLN
INTRAMUSCULAR | Status: DC | PRN
  Administered 2017-05-04: 50 ug via INTRAVENOUS

## 2017-05-04 MED ORDER — OXYCODONE HCL 5 MG/5ML PO SOLN: 5.0000 mg | Freq: Once | ORAL | Status: AC | PRN

## 2017-05-04 MED ORDER — LACTATED RINGERS IV SOLN
INTRAVENOUS | Status: DC
  Administered 2017-05-04: 11:00:00 via INTRAVENOUS

## 2017-05-04 MED ORDER — OXYCODONE HCL 5 MG PO TABS
5.0000 mg | ORAL_TABLET | ORAL | Status: DC | PRN
  Administered 2017-05-05: 10 mg via ORAL
  Administered 2017-05-06: 5 mg via ORAL
  Administered 2017-05-06: 10 mg via ORAL
  Filled 2017-05-04 (×2): qty 2
  Filled 2017-05-04: qty 1

## 2017-05-04 MED ORDER — SODIUM CHLORIDE 0.9 % IR SOLN
Status: DC | PRN
  Administered 2017-05-04: 1000 mL

## 2017-05-04 MED ORDER — HYDROCODONE-ACETAMINOPHEN 5-325 MG PO TABS
1.0000 | ORAL_TABLET | ORAL | Status: DC | PRN
  Administered 2017-05-04 – 2017-05-07 (×6): 2 via ORAL
  Filled 2017-05-04 (×6): qty 2

## 2017-05-04 MED ORDER — METHOCARBAMOL 1000 MG/10ML IJ SOLN: 500.0000 mg | Freq: Four times a day (QID) | INTRAVENOUS | Status: DC | PRN

## 2017-05-04 MED ORDER — HYDROMORPHONE HCL 1 MG/ML IJ SOLN
0.5000 mg | INTRAMUSCULAR | Status: DC | PRN
  Administered 2017-05-06 (×2): 0.5 mg via INTRAVENOUS
  Filled 2017-05-04 (×2): qty 1

## 2017-05-04 MED ORDER — PHENYLEPHRINE 40 MCG/ML (10ML) SYRINGE FOR IV PUSH (FOR BLOOD PRESSURE SUPPORT)
PREFILLED_SYRINGE | INTRAVENOUS | Status: DC | PRN
  Administered 2017-05-04 (×2): 80 ug via INTRAVENOUS

## 2017-05-04 MED ORDER — PROPOFOL 10 MG/ML IV BOLUS
INTRAVENOUS | Status: DC | PRN
  Administered 2017-05-04 (×2): 20 mg via INTRAVENOUS
  Administered 2017-05-04: 30 mg via INTRAVENOUS
  Administered 2017-05-04: 20 mg via INTRAVENOUS

## 2017-05-04 MED ORDER — BUPIVACAINE IN DEXTROSE 0.75-8.25 % IT SOLN
INTRATHECAL | Status: DC | PRN
  Administered 2017-05-04: 2 mL via INTRATHECAL

## 2017-05-04 MED ORDER — MENTHOL 3 MG MT LOZG: 1.0000 | LOZENGE | OROMUCOSAL | Status: DC | PRN

## 2017-05-04 MED ORDER — FUROSEMIDE 20 MG PO TABS
20.0000 mg | ORAL_TABLET | Freq: Every day | ORAL | Status: DC
  Administered 2017-05-05 – 2017-05-07 (×4): 20 mg via ORAL
  Filled 2017-05-04 (×3): qty 1

## 2017-05-04 MED ORDER — PHENOL 1.4 % MT LIQD: 1.0000 | OROMUCOSAL | Status: DC | PRN

## 2017-05-04 MED ORDER — DIPHENHYDRAMINE HCL 12.5 MG/5ML PO ELIX: 12.5000 mg | ORAL_SOLUTION | ORAL | Status: DC | PRN

## 2017-05-04 MED ORDER — OXYCODONE HCL 5 MG PO TABS
5.0000 mg | ORAL_TABLET | Freq: Once | ORAL | Status: AC | PRN
  Administered 2017-05-04: 5 mg via ORAL

## 2017-05-04 MED ORDER — ACETAMINOPHEN 325 MG PO TABS
650.0000 mg | ORAL_TABLET | ORAL | Status: DC | PRN
  Filled 2017-05-04: qty 2

## 2017-05-04 SURGICAL SUPPLY — 57 items
APL SKNCLS STERI-STRIP NONHPOA (GAUZE/BANDAGES/DRESSINGS) ×1
BENZOIN TINCTURE PRP APPL 2/3 (GAUZE/BANDAGES/DRESSINGS) ×3 IMPLANT
BLADE CLIPPER SURG (BLADE) IMPLANT
BLADE SAW SGTL 18X1.27X75 (BLADE) ×2 IMPLANT
BLADE SAW SGTL 18X1.27X75MM (BLADE) ×1
CAPT HIP TOTAL 2 ×2 IMPLANT
CELLS DAT CNTRL 66122 CELL SVR (MISCELLANEOUS) IMPLANT
CLOSURE WOUND 1/2 X4 (GAUZE/BANDAGES/DRESSINGS) ×1
COVER SURGICAL LIGHT HANDLE (MISCELLANEOUS) ×3 IMPLANT
DRAPE C-ARM 42X72 X-RAY (DRAPES) ×3 IMPLANT
DRAPE STERI IOBAN 125X83 (DRAPES) ×3 IMPLANT
DRAPE U-SHAPE 47X51 STRL (DRAPES) ×9 IMPLANT
DRSG AQUACEL AG ADV 3.5X10 (GAUZE/BANDAGES/DRESSINGS) ×3 IMPLANT
DURAPREP 26ML APPLICATOR (WOUND CARE) ×3 IMPLANT
ELECT BLADE 4.0 EZ CLEAN MEGAD (MISCELLANEOUS) ×3
ELECT BLADE 6.5 EXT (BLADE) IMPLANT
ELECT REM PT RETURN 9FT ADLT (ELECTROSURGICAL) ×3
ELECTRODE BLDE 4.0 EZ CLN MEGD (MISCELLANEOUS) ×1 IMPLANT
ELECTRODE REM PT RTRN 9FT ADLT (ELECTROSURGICAL) ×1 IMPLANT
FACESHIELD WRAPAROUND (MASK) ×9 IMPLANT
FACESHIELD WRAPAROUND OR TEAM (MASK) ×2 IMPLANT
GAUZE XEROFORM 1X8 LF (GAUZE/BANDAGES/DRESSINGS) ×2 IMPLANT
GLOVE BIOGEL PI IND STRL 7.0 (GLOVE) IMPLANT
GLOVE BIOGEL PI IND STRL 8 (GLOVE) ×2 IMPLANT
GLOVE BIOGEL PI INDICATOR 7.0 (GLOVE) ×2
GLOVE BIOGEL PI INDICATOR 8 (GLOVE) ×4
GLOVE ECLIPSE 8.0 STRL XLNG CF (GLOVE) ×3 IMPLANT
GLOVE ORTHO TXT STRL SZ7.5 (GLOVE) ×6 IMPLANT
GLOVE SURG SS PI 7.0 STRL IVOR (GLOVE) ×2 IMPLANT
GOWN STRL REUS W/ TWL LRG LVL3 (GOWN DISPOSABLE) ×2 IMPLANT
GOWN STRL REUS W/ TWL XL LVL3 (GOWN DISPOSABLE) ×2 IMPLANT
GOWN STRL REUS W/TWL LRG LVL3 (GOWN DISPOSABLE) ×9
GOWN STRL REUS W/TWL XL LVL3 (GOWN DISPOSABLE) ×15
HANDPIECE INTERPULSE COAX TIP (DISPOSABLE) ×3
KIT BASIN OR (CUSTOM PROCEDURE TRAY) ×3 IMPLANT
KIT ROOM TURNOVER OR (KITS) ×3 IMPLANT
MANIFOLD NEPTUNE II (INSTRUMENTS) ×3 IMPLANT
NS IRRIG 1000ML POUR BTL (IV SOLUTION) ×3 IMPLANT
PACK TOTAL JOINT (CUSTOM PROCEDURE TRAY) ×3 IMPLANT
PAD ARMBOARD 7.5X6 YLW CONV (MISCELLANEOUS) ×3 IMPLANT
RETRACTOR WND ALEXIS 18 MED (MISCELLANEOUS) ×1 IMPLANT
RTRCTR WOUND ALEXIS 18CM MED (MISCELLANEOUS)
SET HNDPC FAN SPRY TIP SCT (DISPOSABLE) ×1 IMPLANT
STAPLER VISISTAT 35W (STAPLE) ×2 IMPLANT
STRIP CLOSURE SKIN 1/2X4 (GAUZE/BANDAGES/DRESSINGS) ×3 IMPLANT
SUT ETHIBOND NAB CT1 #1 30IN (SUTURE) ×3 IMPLANT
SUT MNCRL AB 4-0 PS2 18 (SUTURE) ×2 IMPLANT
SUT VIC AB 0 CT1 27 (SUTURE) ×3
SUT VIC AB 0 CT1 27XBRD ANBCTR (SUTURE) ×1 IMPLANT
SUT VIC AB 1 CT1 27 (SUTURE) ×6
SUT VIC AB 1 CT1 27XBRD ANBCTR (SUTURE) ×1 IMPLANT
SUT VIC AB 2-0 CT1 27 (SUTURE) ×6
SUT VIC AB 2-0 CT1 TAPERPNT 27 (SUTURE) ×1 IMPLANT
TOWEL OR 17X24 6PK STRL BLUE (TOWEL DISPOSABLE) ×3 IMPLANT
TOWEL OR 17X26 10 PK STRL BLUE (TOWEL DISPOSABLE) ×3 IMPLANT
TRAY CATH 16FR W/PLASTIC CATH (SET/KITS/TRAYS/PACK) IMPLANT
TRAY FOLEY W/METER SILVER 16FR (SET/KITS/TRAYS/PACK) ×2 IMPLANT

## 2017-05-04 NOTE — Progress Notes (Signed)
PT Cancellation Note  Patient Details Name: DAVEY BERGSMA MRN: [id number] DOB: 1933/01/30   Cancelled Treatment:    Reason Eval/Treat Not Completed: Pain limiting ability to participate Pt refusing, stating she is in too much pain to participate. Spoke with RN and RN had already given pain medications. Pt requesting to hold off on PT until tomorrow. Will follow up as schedule allows.   Leighton Ruff, PT, DPT  Acute Rehabilitation Services  Pager: [phone]    Rudean Hitt 05/04/2017, 5:35 PM

## 2017-05-04 NOTE — Anesthesia Procedure Notes (Signed)
Spinal  Patient location during procedure: OR Start time: 05/04/2017 12:47 PM End time: 05/04/2017 12:52 PM Staffing Anesthesiologist: Lynda Rainwater, MD Performed: anesthesiologist  Preanesthetic Checklist Completed: patient identified, site marked, surgical consent, pre-op evaluation, timeout performed, IV checked, risks and benefits discussed and monitors and equipment checked Spinal Block Patient position: sitting Prep: Betadine Patient monitoring: heart rate, cardiac monitor, continuous pulse ox and blood pressure Approach: midline Location: L3-4 Injection technique: single-shot Needle Needle type: Quincke  Needle gauge: 22 G Needle length: 9 cm

## 2017-05-04 NOTE — Brief Op Note (Signed)
05/04/2017  2:14 PM  PATIENT:  [name]  84 y.o. female  PRE-OPERATIVE DIAGNOSIS:  osteoarthritis left hip  POST-OPERATIVE DIAGNOSIS:  osteoarthritis left hip  PROCEDURE:  Procedure(s): LEFT TOTAL HIP ARTHROPLASTY ANTERIOR APPROACH (Left)  SURGEON:  Surgeon(s) and Role:    Mcarthur Rossetti, MD - Primary  PHYSICIAN ASSISTANT: Benita Stabile, PA-C  ANESTHESIA:   spinal  EBL:  150 mL   COUNTS:  YES  DICTATION: .Other Dictation: Dictation Number [id number]  PLAN OF CARE: Admit to inpatient   PATIENT DISPOSITION:  PACU - hemodynamically stable.   Delay start of Pharmacological VTE agent (>24hrs) due to surgical blood loss or risk of bleeding: no

## 2017-05-04 NOTE — H&P (Signed)
TOTAL HIP ADMISSION H&P  Patient is admitted for left total hip arthroplasty.  Subjective:  Chief Complaint: left hip pain  HPI: [name], 84 y.o. female, has a history of pain and functional disability in the left hip(s) due to arthritis and patient has failed non-surgical conservative treatments for greater than 12 weeks to include NSAID's and/or analgesics, corticosteriod injections, use of assistive devices and activity modification.  Onset of symptoms was gradual starting 3 years ago with rapidlly worsening course since that time.The patient noted no past surgery on the left hip(s).  Patient currently rates pain in the left hip at 10 out of 10 with activity. Patient has night pain, worsening of pain with activity and weight bearing, trendelenberg gait, pain that interfers with activities of daily living, pain with passive range of motion and crepitus. Patient has evidence of subchondral sclerosis, periarticular osteophytes and joint space narrowing by imaging studies. This condition presents safety issues increasing the risk of falls.  There is no current active infection.  Patient Active Problem List   Diagnosis Date Noted  . Unilateral primary osteoarthritis, left hip 05/04/2017  . Hip osteoarthritis 04/27/2017  . Long term (current) use of anticoagulants 03/04/2017  . Venous stasis dermatitis of both lower extremities 01/08/2017  . Impacted cerumen of right ear 11/20/2016  . Osteopenia 10/25/2016  . Pedal edema 08/26/2016  . Varicose veins of both lower extremities 08/26/2016  . Estrogen deficiency 08/26/2016  . Screening mammogram, encounter for 08/26/2016  . Hemorrhoids 08/26/2016  . Epistaxis 01/07/2016  . Urticaria 08/22/2014  . Hip pain 08/02/2014  . Left knee pain 08/02/2014  . Chronic cough 05/08/2014  . Caregiver stress 08/16/2013  . Colon cancer screening 08/16/2013  . Encounter for therapeutic drug monitoring 04/20/2013  . Left ovarian cyst 03/14/2013  . (HFpEF)  heart failure with preserved ejection fraction ([place]) 12/27/2012  . Cardiomyopathy, secondary --Resolved again 10/14 10/13/2010  . COLONIC POLYPS, ADENOMATOUS, HX OF 09/18/2009  . PULMONARY NODULE 12/20/2008  . GANGLION CYST 10/04/2007  . Hyperlipidemia 04/27/2007  . DEPRESSION 04/27/2007  . Asthma, mild intermittent 04/27/2007  . INSOMNIA 04/27/2007  . ADENOMATOUS COLONIC POLYP 11/04/2006  . Hypothyroidism 09/02/2006  . Atrial fibrillation ([place]) 08/05/2006   Past Medical History:  Diagnosis Date  . Allergic rhinitis   . 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 Arthritis   . Atrial fibrillation -persistent cardiologist-  dr klein/  primary EP -- [facility] Tawanna Sat ([facility])   a. s/p PVI [name] 2010;  b. on tikosyn/coumadin;  c. 05/2009 Echo: EF 60-65%, Gr 2 DD. (first dx 09/ 2007)  . Bilateral lower extremity edema   . Bleeding hemorrhoid   . Carotid stenosis    mild (hosp 3/11)- consult by vasc/ Dr Donnetta Hutching  . Complication of anesthesia    hard to wake  . Depression    denies  . Diverticulosis of colon   . Dyspnea    on exertion-climbing stairs  . Dysrhythmia   . Fatty liver   . H/O cardiac radiofrequency ablation    01/ 2008 at [place] /  03/ 2010  at [facility]  . Heart failure with preserved ejection fraction ([place])   . Heart murmur    "prior to valve repair"  . History of adenomatous polyp of colon    tubular adenoma's  . History of cardiomyopathy    secondary tachycardia-induced cardiomyopathy -- resolved 2014  . History of squamous cell carcinoma in situ (SCCIS) of skin    05/ 2017  nasal bridge and right medial knee  . History of transient ischemic attack (TIA)    01-24-2005 and 06-12-2009  . Hyperlipidemia   . Hypothyroidism   . Mild intermittent asthma    reacts to cats  . Mixed stress and urge urinary incontinence   . Pulmonary nodule   . S/P mitral valve repair 10-23-1998  dr Boyce Medici at [facility]   for MVP and regurg. (annuloplasty ring procedure)  .  Swelling of left extremity 2017   states it's gotten worse  . Varicose vein of leg   . Wears glasses     Past Surgical History:  Procedure Laterality Date  . APPENDECTOMY  1978  . CARDIAC ELECTROPHYSIOLOGY [place] AND ABLATION  01/ 2008    at [place]   right-sided ablation atrial flutter  . CARDIAC ELECTROPHYSIOLOGY STUDY AND ABLATION  03/ 2010   dr Jaymes Graff at [facility]   AV node ablation and pulmonary vein isolation for atrial fib  . CARDIOVERSION  06-18-2006;  07-13-2006;  10-19-2010;  10-27-2010  . COLONOSCOPY    . CYSTO/ TRANSURETHRAL COLLAGEN INJECTION THERAPY  07-26-2007   dr Matilde Sprang  . DILATION AND CURETTAGE OF UTERUS    . EXCISIONAL HEMORRHOIDECTOMY  1980s  . HEMORRHOID SURGERY N/A 10/29/2016   Procedure: HEMORRHOIDECTOMY;  Surgeon: Leighton Ruff, MD;  Location: [facility];  Service: General;  Laterality: N/A;  . MITRAL VALVE ANNULOPLASTY  10/23/1998   "Model 4625; Campbell Lerner [id number]"; size 3mm; [facility]; Dr. Boyce Medici  . PILONIDAL CYST EXCISION  1954  . TEE WITH CARDIOVERSION  05-06-2006 at [facility];  01-02-2013 at [facility]  . TRANSTHORACIC ECHOCARDIOGRAM  05-01-2015   dr Caryl Comes   ef 50-55%/  mild AV sclerosis without stenosis/  post MV repair with mild central MR (valve area by pressure half-time 2cm^2,  valve area by continutity equation 0.91cm^2, peak grandiant 45mmHg)/  severe LAE/ mild TR/ mild RAE   . TUBAL LIGATION Bilateral 1978    Current Facility-Administered Medications  Medication Dose Route Frequency Provider Last Rate Last Dose  . chlorhexidine (HIBICLENS) 4 % liquid 4 application  60 mL Topical Once Erskine Emery W, PA-C      . clindamycin (CLEOCIN) IVPB 900 mg  900 mg Intravenous To SS-Surg Mcarthur Rossetti, MD      . lactated ringers infusion   Intravenous Continuous Lyndle Herrlich, MD 10 mL/hr at 05/04/17 1037    . tranexamic acid (CYKLOKAPRON) 1,000 mg in sodium chloride 0.9 % 100 mL IVPB  1,000 mg Intravenous To OR Mcarthur Rossetti, MD       Allergies  Allergen Reactions  . Amiodarone Hcl Swelling    SWELLING REACTION UNSPECIFIED   . Penicillins Rash    Has patient had a PCN reaction causing immediate rash, facial/tongue/throat swelling, SOB or lightheadedness with hypotension: No Has patient had a PCN reaction causing severe rash involving mucus membranes or skin necrosis: No Has patient had a PCN reaction that required hospitalization:Patient was inpatient when reaction occurred Has patient had a PCN reaction occurring within the last 10 years: No If all of the above answers are "NO", then may proceed with Cephalosporin use.   . Statins Rash    Social History   Tobacco Use  . Smoking status: Never Smoker  . Smokeless tobacco: Never Used  Substance Use Topics  . Alcohol use: Yes    Alcohol/week: 0.0 oz    Comment: seldom    Family History  Problem Relation Age of Onset  .  Hypertension Mother   . Lung cancer Father        smoker  . Alcohol abuse Father   . Cancer Father        bladder and lung CA smoker  . Breast cancer Neg Hx      Review of Systems  Musculoskeletal: Positive for joint pain.  All other systems reviewed and are negative.   Objective:  Physical Exam  Constitutional: She is oriented to person, place, and time. She appears well-developed and well-nourished.  HENT:  Head: Normocephalic and atraumatic.  Eyes: EOM are normal. Pupils are equal, round, and reactive to light.  Neck: Normal range of motion. Neck supple.  Cardiovascular: Normal rate and regular rhythm.  Respiratory: Effort normal and breath sounds normal.  GI: Soft. Bowel sounds are normal.  Musculoskeletal:       Left hip: She exhibits decreased range of motion, decreased strength, tenderness and bony tenderness.  Neurological: She is alert and oriented to person, place, and time.  Skin: Skin is warm and dry.  Psychiatric: She has a normal mood and affect.    Vital signs in last 24 hours: Temp:   [98.2 F (36.8 C)] 98.2 F (36.8 C) (02/12 1011) Pulse Rate:  [80] 80 (02/12 1011) Resp:  [18] 18 (02/12 1011) BP: (123)/(69) 123/69 (02/12 1011) SpO2:  [96 %] 96 % (02/12 1011) Weight:  [184 lb 3.2 oz (83.6 kg)] 184 lb 3.2 oz (83.6 kg) (02/12 1011)  Labs:   Estimated body mass index is 30.65 kg/m as calculated from the following:   Height as of this encounter: 5\' 5"  (1.651 m).   Weight as of this encounter: 184 lb 3.2 oz (83.6 kg).   Imaging Review Plain radiographs demonstrate severe degenerative joint disease of the left hip(s). The bone quality appears to be good for age and reported activity level.  Assessment/Plan:  End stage arthritis, left hip(s)  The patient history, physical examination, clinical judgement of the provider and imaging studies are consistent with end stage degenerative joint disease of the left hip(s) and total hip arthroplasty is deemed medically necessary. The treatment options including medical management, injection therapy, arthroscopy and arthroplasty were discussed at length. The risks and benefits of total hip arthroplasty were presented and reviewed. The risks due to aseptic loosening, infection, stiffness, dislocation/subluxation,  thromboembolic complications and other imponderables were discussed.  The patient acknowledged the explanation, agreed to proceed with the plan and consent was signed. Patient is being admitted for inpatient treatment for surgery, pain control, PT, OT, prophylactic antibiotics, VTE prophylaxis, progressive ambulation and ADL's and discharge planning.The patient is planning to be discharged to skilled nursing facility

## 2017-05-04 NOTE — Progress Notes (Signed)
Neo drip turned off at 1525. 234ml albumin given per Dr Tyler Pita verbal order. Pt maintaining VS BP 90's/50's.

## 2017-05-04 NOTE — Transfer of Care (Signed)
Immediate Anesthesia Transfer of Care Note  Patient: [name]  Procedure(s) Performed: LEFT TOTAL HIP ARTHROPLASTY ANTERIOR APPROACH (Left Hip)  Patient Location: PACU  Anesthesia Type:Spinal  Level of Consciousness: drowsy and patient cooperative  Airway & Oxygen Therapy: Patient Spontanous Breathing and Patient connected to face mask oxygen  Post-op Assessment: Report given to RN and Post -op Vital signs reviewed and stable  Post vital signs: Reviewed and stable  Last Vitals:  Vitals:   05/04/17 1434 05/04/17 1435  BP: (!) 76/48 (!) 89/42  Pulse: (!) 53 72  Resp: 15 16  Temp:    SpO2: 99% 100%    Last Pain:  Vitals:   05/04/17 1433  TempSrc:   PainSc: 0-No pain      Patients Stated Pain Goal: 4 (25/63/89 3734)  Complications: No apparent anesthesia complications

## 2017-05-04 NOTE — Anesthesia Preprocedure Evaluation (Signed)
Anesthesia Evaluation  Patient identified by MRN, date of birth, ID band Patient awake    Reviewed: Allergy & Precautions, NPO status , Patient's Chart, lab work & pertinent test results  Airway Mallampati: II  TM Distance: >3 FB Neck ROM: Full    Dental no notable dental hx.    Pulmonary neg pulmonary ROS,    Pulmonary exam normal breath sounds clear to auscultation       Cardiovascular + dysrhythmias Atrial Fibrillation  Rhythm:Irregular Rate:Normal  S/p MVR S/p ablation   Neuro/Psych negative neurological ROS  negative psych ROS   GI/Hepatic negative GI ROS, Neg liver ROS,   Endo/Other  Hypothyroidism   Renal/GU negative Renal ROS  negative genitourinary   Musculoskeletal negative musculoskeletal ROS (+)   Abdominal   Peds negative pediatric ROS (+)  Hematology negative hematology ROS (+)   Anesthesia Other Findings   Reproductive/Obstetrics negative OB ROS                             Anesthesia Physical  Anesthesia Plan  ASA: III  Anesthesia Plan: MAC and Spinal   Post-op Pain Management:    Induction:   PONV Risk Score and Plan: 2 and Ondansetron, Dexamethasone and Treatment may vary due to age or medical condition  Airway Management Planned: Simple Face Mask  Additional Equipment:   Intra-op Plan:   Post-operative Plan:   Informed Consent: I have reviewed the patients History and Physical, chart, labs and discussed the procedure including the risks, benefits and alternatives for the proposed anesthesia with the patient or authorized representative who has indicated his/her understanding and acceptance.   Dental advisory given  Plan Discussed with:   Anesthesia Plan Comments:         Anesthesia Quick Evaluation

## 2017-05-04 NOTE — Anesthesia Postprocedure Evaluation (Signed)
Anesthesia Post Note  Patient: [name]  Procedure(s) Performed: LEFT TOTAL HIP ARTHROPLASTY ANTERIOR APPROACH (Left Hip)     Anesthesia Type: MAC    Last Vitals:  Vitals:   05/04/17 1636 05/04/17 2048  BP: (!) 129/100 111/64  Pulse: 70 85  Resp: 12 16  Temp:  36.6 C  SpO2: 90% 96%    Last Pain:  Vitals:   05/04/17 2048  TempSrc: Oral  PainSc:                  Lynda Rainwater

## 2017-05-04 NOTE — Progress Notes (Signed)
Pt Bp 76/48 upon arrival to pacu, asymptomatic, responding to voice and answering all questions appropriately. Merrilee Seashore CRNA initiated neo drip at 42mcg until spinal wears off.

## 2017-05-04 NOTE — Progress Notes (Signed)
ANTICOAGULATION CONSULT NOTE - Initial Consult  Pharmacy Consult:  Coumadin Indication: atrial fibrillation  Allergies  Allergen Reactions  . Amiodarone Hcl Swelling    SWELLING REACTION UNSPECIFIED   . Penicillins Rash    Has patient had a PCN reaction causing immediate rash, facial/tongue/throat swelling, SOB or lightheadedness with hypotension: No Has patient had a PCN reaction causing severe rash involving mucus membranes or skin necrosis: No Has patient had a PCN reaction that required hospitalization:Patient was inpatient when reaction occurred Has patient had a PCN reaction occurring within the last 10 years: No If all of the above answers are "NO", then may proceed with Cephalosporin use.   . Statins Rash    Patient Measurements: Height: 5\' 5"  (165.1 cm) Weight: 184 lb 3.2 oz (83.6 kg) IBW/kg (Calculated) : 57  Vital Signs: Temp: 98 F (36.7 C) (02/12 1602) Temp Source: Oral (02/12 1011) BP: 129/100 (02/12 1636) Pulse Rate: 70 (02/12 1636)  Labs: Recent Labs    05/04/17 1034  LABPROT 14.6  INR 1.15    Estimated Creatinine Clearance: 57.9 mL/min (by C-G formula based on SCr of 0.79 mg/dL).   Medical History: Past Medical History:  Diagnosis Date  . Allergic rhinitis   . 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 Arthritis   . Atrial fibrillation -persistent cardiologist-  dr klein/  primary EP -- [facility] Tawanna Sat ([facility])   a. s/p PVI [name] 2010;  b. on tikosyn/coumadin;  c. 05/2009 Echo: EF 60-65%, Gr 2 DD. (first dx 09/ 2007)  . Bilateral lower extremity edema   . Bleeding hemorrhoid   . Carotid stenosis    mild (hosp 3/11)- consult by vasc/ Dr Donnetta Hutching  . Complication of anesthesia    hard to wake  . Depression    denies  . Diverticulosis of colon   . Dyspnea    on exertion-climbing stairs  . Dysrhythmia   . Fatty liver   . H/O cardiac radiofrequency ablation    01/ 2008 at [facility] [place] /  03/ 2010  at [facility]  . Heart failure with preserved ejection  fraction ([place])   . Heart murmur    "prior to valve repair"  . History of adenomatous polyp of colon    tubular adenoma's  . History of cardiomyopathy    secondary tachycardia-induced cardiomyopathy -- resolved 2014  . History of squamous cell carcinoma in situ (SCCIS) of skin    05/ 2017  nasal bridge and right medial knee  . History of transient ischemic attack (TIA)    01-24-2005 and 06-12-2009  . Hyperlipidemia   . Hypothyroidism   . Mild intermittent asthma    reacts to cats  . Mixed stress and urge urinary incontinence   . Pulmonary nodule   . S/P mitral valve repair 10-23-1998  dr Boyce Medici at [facility]   for MVP and regurg. (annuloplasty ring procedure)  . Swelling of left extremity 2017   states it's gotten worse  . Varicose vein of leg   . Wears glasses       Assessment: 55 YOF with history of Afib on Coumadin PTA.  Patient was switched to Lovenox bridge PTA for left THA today 05/04/17.  INR is sub-therapeutic as expected.  No bleeding reported.  Home Coumadin dose: 5mg  daily except 2.5mg  on MWF   Goal of Therapy:  INR 2-3 Monitor platelets by anticoagulation protocol: Yes    Plan:  Coumadin 7.5mg  PO today Daily PT / INR    Adryan Shin D. Mina Marble,  PharmD, BCPS Pager:  [phone] 05/04/2017, 4:45 PM

## 2017-05-05 ENCOUNTER — Ambulatory Visit: Payer: Medicare Other | Admitting: *Deleted

## 2017-05-05 ENCOUNTER — Encounter (HOSPITAL_COMMUNITY): Payer: Self-pay | Admitting: Orthopaedic Surgery

## 2017-05-05 LAB — BASIC METABOLIC PANEL
ANION GAP: 12 (ref 5–15)
BUN: 15 mg/dL (ref 6–20)
CO2: 24 mmol/L (ref 22–32)
Calcium: 7.8 mg/dL — ABNORMAL LOW (ref 8.9–10.3)
Chloride: 100 mmol/L — ABNORMAL LOW (ref 101–111)
Creatinine, Ser: 0.8 mg/dL (ref 0.44–1.00)
GFR calc Af Amer: >60 mL/min (ref >60)
Glucose, Bld: 145 mg/dL — ABNORMAL HIGH (ref 65–99)
POTASSIUM: 4 mmol/L (ref 3.5–5.1)
SODIUM: 136 mmol/L (ref 135–145)

## 2017-05-05 LAB — CBC
HCT: 31.7 % — ABNORMAL LOW (ref 36.0–46.0)
Hemoglobin: 10.1 g/dL — ABNORMAL LOW (ref 12.0–15.0)
MCH: 27.4 pg (ref 26.0–34.0)
MCHC: 31.9 g/dL (ref 30.0–36.0)
MCV: 86.1 fL (ref 78.0–100.0)
PLATELETS: 117 10*3/uL — AB (ref 150–400)
RBC: 3.68 MIL/uL — AB (ref 3.87–5.11)
RDW: 14.9 % (ref 11.5–15.5)
WBC: 5.5 10*3/uL (ref 4.0–10.5)

## 2017-05-05 LAB — PROTIME-INR
INR: 1.18
PROTHROMBIN TIME: 14.9 s (ref 11.4–15.2)

## 2017-05-05 MED ORDER — WARFARIN SODIUM 5 MG PO TABS
5.0000 mg | ORAL_TABLET | Freq: Once | ORAL | Status: AC
Start: 2017-05-05 — End: 2017-05-05
  Administered 2017-05-05: 5 mg via ORAL
  Filled 2017-05-05: qty 1

## 2017-05-05 NOTE — Op Note (Signed)
NAME:  [name], [name]                      ACCOUNT NO.:  MEDICAL RECORD NO.:  [id number]  LOCATION:                                 FACILITY:  PHYSICIAN:  Lind Guest. Ninfa Linden, M.D.DATE OF BIRTH:  DATE OF PROCEDURE:  05/04/2017 DATE OF DISCHARGE:                              OPERATIVE REPORT   PREOPERATIVE DIAGNOSIS:  Primary osteoarthritis and degenerative joint disease, left hip.  POSTOPERATIVE DIAGNOSIS:  Primary osteoarthritis and degenerative joint disease, left hip.  PROCEDURE:  Left total hip arthroplasty through direct anterior approach.  IMPLANTS:  DePuy Sector Gription acetabular component size 54, size 36 +0 polyethylene liner, size 12 Corail femoral component with standard offset, size 36 +1.5 ceramic hip ball.  SURGEON:  Lind Guest. Ninfa Linden, M.D.  ASSISTANT:  Erskine Emery, PA-C.  ANESTHESIA:  Spinal.  ANTIBIOTICS:  IV clindamycin 900 mg.  BLOOD LOSS:  150 mL.  COMPLICATIONS:  None.  INDICATIONS:  [name] is an 84 year old patient well known to me. She has debilitating arthritis involving her left hip.  She has been dealing with this for years, but then a fall toward the end of last worsened her hip pain.  It has now become a 10/10 type of pain.  It has detrimentally affected her activities of daily living, her quality of life, and her mobility.  At this point, she does wish to proceed with a total hip arthroplasty.  We talked about the risk of acute blood loss anemia, nerve and vessel injury, fracture, infection, dislocation, and DVT.  She understands our goals are to decrease pain, improve mobility, and overall improved quality of life.  PROCEDURE DESCRIPTION:  After informed consent was obtained, appropriate left hip was marked.  She was brought to the operating room and sat up on her stretcher where spinal anesthesia was obtained.  She was then laid in a supine position on the stretcher.  A Foley catheter was placed and both feet had  traction boots applied to them.  She was placed supine on the Hana fracture table with the perineal post in place and both legs in InLine skeletal traction devices, but no traction applied.  Her left operative hip was prepped and draped with DuraPrep and sterile drapes.  Time-out was called.  She was identified as correct patient and correct left hip.  We then made an incision just inferior and posterior to the anterior superior iliac spine and carried this obliquely down the leg.  We dissected down to tensor fascia lata muscle.  The tensor fascia was then divided longitudinally to proceed with a direct anterior approach to the hip.  We identified and cauterized the circumflex vessels and then identified the hip capsule. I opened up the hip capsule in an L-type format, finding a large joint effusion and significant arthritis periarticularly around the left hip. We placed Cobra retractors around the medial and lateral femoral neck and made our femoral neck cut with an oscillating saw and completed this with an osteotome.  We placed a corkscrew guide in the femoral head and removed the femoral head in its entirety and found it to be devoid of cartilage over the large area.  I then placed a bent Hohmann over the medial acetabular rim and removed remnants of the acetabular labrum and other debris.  I then began reaming under direct visualization from a size 42 reamer going in stepwise increments up to a size 54 with all reamers under direct visualization, the last reamer under direct fluoroscopy, so we could obtain our depth of reaming, our inclination, and anteversion.  Once we were pleased with this, we placed the real DePuy Sector Gription acetabular component size 54, under direct visualization and fluoroscopy.  We then placed a 36 +0 polyethylene liner for that size acetabular component.  Attention was then turned to the femur.  With the leg externally rotated to 120 degrees,  extended, and adducted, we were able to place a Mueller retractor medially and a Hohmann retractor behind the greater trochanter.  We released the lateral joint capsule and used a box cutting osteotome to enter the femoral canal and a rongeur to lateralize.  We then began broaching from a size 8 broach using the Corail broaching system going up to a size 12. With a size 12 in place, we trialed a standard offset femoral neck and a 36+ 1.5 hip ball.  We rolled the leg back over and up with traction and internal rotation reducing the pelvis.  We were pleased with leg length, offset, range of motion, and stability.  We then dislocated the hip and removed the trial components.  We were able to place the real Corail femoral component with standard offset, size 12 and the real 36 +1.5 ceramic hip ball.  Again, we reduced this in the acetabulum and it was stable.  We then irrigated the soft tissue with normal saline solution using pulsatile lavage.  We closed the joint capsule with interrupted #1 Ethibond suture followed by running #1 Vicryl in tensor fascia, 0 Vicryl in the deep tissue, 2-0 Vicryl in the subcutaneous tissue, interrupted staples on the skin.  Xeroform and Aquacel dressing were applied.  She was taken off the Hana table and taken to the recovery room in stable condition.  All final counts were correct.  There were no complications noted.  Of note, Erskine Emery, PA-C assisted in the entire case.  His assistance was crucial for facilitating all aspects of this case.     Lind Guest. Ninfa Linden, M.D.     CYB/MEDQ  D:  05/04/2017  T:  05/04/2017  Job:  [id number]

## 2017-05-05 NOTE — Evaluation (Signed)
Occupational Therapy Evaluation Patient Details Name: [name] MRN: [id number] DOB: [birth year] Today's Date: 05/05/2017    History of Present Illness Admitted for L THA, Direct Anterior, WBAT;  has a pertinent past medical history of Arthritis, Atrial fibrillation -persistent, Bilateral lower extremity edema, Complication of anesthesia, Depression, , Dyspnea, Dysrhythmia, Fatty liver, H/O cardiac radiofrequency ablation, Heart failure with preserved ejection fraction ([place]), Heart murmur,  History of cardiomyopathy, , Hypothyroidism, Mild intermittent asthma, Mixed stress and urge urinary incontinence, Pulmonary nodule, S/P mitral valve repair and Wears glasses.   Clinical Impression   This 84 y/o F presents with the above. Pt lives alone, at baseline is independent with ADLs and functional mobility. Pt presenting with increased pain, decreased dynamic balance and functional performance. Pt completing room level functional mobility and toileting at RW level with MinA this session, currently requires ModA for LB ADLs. Pt reports plans for SNF stay prior to return home. Due to Pt's living alone and current level of assist needed feel this is appropriate recommendation. Will continue to follow acutely to progress her overall safety and independence with ADLs and functional mobility prior to discharge.     Follow Up Recommendations  Follow surgeon's recommendation for DC plan and follow-up therapies;SNF;Supervision/Assistance - 24 hour    Equipment Recommendations  Other (comment)(defer to next venue )           Precautions / Restrictions Precautions Precautions: None Restrictions Weight Bearing Restrictions: No LLE Weight Bearing: Weight bearing as tolerated      Mobility Bed Mobility Overal bed mobility: Needs Assistance Bed Mobility: Sit to Supine     Supine to sit: Min assist Sit to supine: Mod assist   General bed mobility comments: OOB in recliner upon arrival    Transfers Overall transfer level: Needs assistance Equipment used: Rolling walker (2 wheeled) Transfers: Sit to/from Stand Sit to Stand: Min assist         General transfer comment: Min assist to power up and steady during transition of hands to RW    Balance Overall balance assessment: Needs assistance           Standing balance-Leahy Scale: Fair Standing balance comment: maintains static standing without UE support and close minguard;  reliant on UE support during mobility                           ADL either performed or assessed with clinical judgement   ADL Overall ADL's : Needs assistance/impaired Eating/Feeding: Modified independent;Sitting   Grooming: Wash/dry face;Wash/dry hands;Min guard;Standing   Upper Body Bathing: Min guard;Sitting   Lower Body Bathing: Minimal assistance;Sit to/from stand   Upper Body Dressing : Min guard;Sitting   Lower Body Dressing: Moderate assistance;Sit to/from stand   Toilet Transfer: Minimal assistance;Ambulation;Regular Toilet;Grab bars;RW   Toileting- Clothing Manipulation and Hygiene: Minimal assistance;Sit to/from stand Toileting - Clothing Manipulation Details (indicate cue type and reason): assist for gown management, Pt completing peri-care while sitting      Functional mobility during ADLs: Minimal assistance;Rolling walker General ADL Comments: Pt completing in-room functional mobility, toileting and standing grooming ADLs, requires min verbal cues for safe RW use                          Pertinent Vitals/Pain Pain Assessment: Faces Pain Score: 7  Faces Pain Scale: Hurts even more Pain Location: L hip/knee  Pain Descriptors / Indicators: Aching;Operative site guarding Pain Intervention(s): Limited  activity within patient's tolerance;Monitored during session;Patient requesting pain meds-RN notified          Extremity/Trunk Assessment Upper Extremity Assessment Upper Extremity Assessment:  Overall WFL for tasks assessed   Lower Extremity Assessment Lower Extremity Assessment: Defer to PT evaluation LLE Deficits / Details: Grosssly decr AROM and strength, limited by postop pain       Communication Communication Communication: No difficulties   Cognition Arousal/Alertness: Awake/alert Behavior During Therapy: WFL for tasks assessed/performed Overall Cognitive Status: Within Functional Limits for tasks assessed                                                    Home Living Family/patient expects to be discharged to:: Skilled nursing facility                                        Prior Functioning/Environment Level of Independence: Independent                 OT Problem List: Decreased strength;Decreased activity tolerance;Decreased range of motion;Impaired balance (sitting and/or standing);Decreased knowledge of precautions;Decreased knowledge of use of DME or AE      OT Treatment/Interventions: Self-care/ADL training;DME and/or AE instruction;Therapeutic activities;Balance training;Therapeutic exercise;Energy conservation;Patient/family education    OT Goals(Current goals can be found in the care plan section) Acute Rehab OT Goals Patient Stated Goal: walk without pain OT Goal Formulation: With patient Time For Goal Achievement: 05/19/17 Potential to Achieve Goals: Good  OT Frequency: Min 2X/week   Barriers to D/C: Decreased caregiver support                        AM-PAC PT "6 Clicks" Daily Activity     Outcome Measure Help from another person eating meals?: None Help from another person taking care of personal grooming?: A Little Help from another person toileting, which includes using toliet, bedpan, or urinal?: A Little Help from another person bathing (including washing, rinsing, drying)?: A Lot Help from another person to put on and taking off regular upper body clothing?: A Little Help from  another person to put on and taking off regular lower body clothing?: A Lot 6 Click Score: 17   End of Session Equipment Utilized During Treatment: Gait belt;Rolling walker Nurse Communication: Mobility status  Activity Tolerance: Patient tolerated treatment well;Patient limited by pain Patient left: Other (comment)(handoff to PT)  OT Visit Diagnosis: Other abnormalities of gait and mobility (R26.89);Pain Pain - Right/Left: Left Pain - part of body: Hip                Time: 5409-8119 OT Time Calculation (min): 15 min Charges:  OT General Charges $OT Visit: 1 Visit OT Evaluation $OT Eval Low Complexity: 1 Low G-Codes:     Lou Cal, OT Pager [phone] 05/05/2017   Raymondo Band 05/05/2017, 3:49 PM

## 2017-05-05 NOTE — Progress Notes (Signed)
Subjective: 1 Day Post-Op Procedure(s) (LRB): LEFT TOTAL HIP ARTHROPLASTY ANTERIOR APPROACH (Left) Patient reports pain as moderate.  Labs not back yet.  Objective: Vital signs in last 24 hours: Temp:  [97.2 F (36.2 C)-98.4 F (36.9 C)] 98.4 F (36.9 C) (02/13 0530) Pulse Rate:  [53-105] 105 (02/13 0530) Resp:  [11-25] 17 (02/13 0530) BP: (76-129)/(42-100) 97/52 (02/13 0530) SpO2:  [90 %-100 %] 97 % (02/13 0530) Weight:  [184 lb 3.2 oz (83.6 kg)] 184 lb 3.2 oz (83.6 kg) (02/12 1011)  Intake/Output from previous day: 02/12 0701 - 02/13 0700 In: 1400 [I.V.:1100; IV Piggyback:300] Out: 650 [Urine:500; Blood:150] Intake/Output this shift: No intake/output data recorded.  No results for input(s): HGB in the last 72 hours. No results for input(s): WBC, RBC, HCT, PLT in the last 72 hours. No results for input(s): NA, K, CL, CO2, BUN, CREATININE, GLUCOSE, CALCIUM in the last 72 hours. Recent Labs    05/04/17 1034  INR 1.15    Sensation intact distally Intact pulses distally Dorsiflexion/Plantar flexion intact Incision: dressing C/D/I  Assessment/Plan: 1 Day Post-Op Procedure(s) (LRB): LEFT TOTAL HIP ARTHROPLASTY ANTERIOR APPROACH (Left) Up with therapy Discharge to [facility] Friday.  Mcarthur Rossetti 05/05/2017, 7:08 AM

## 2017-05-05 NOTE — Clinical Social Work Note (Signed)
Clinical Social Work Assessment  Patient Details  Name: [name] MRN: [id number] Date of Birth: [birth year]  Date of referral:  05/05/17               Reason for consult:  Facility Placement                Permission sought to share information with:  Facility Art therapist granted to share information::  Yes, Release of Information Signed  Name::        Agency::  SNF  Relationship::     Contact Information:     Housing/Transportation Living arrangements for the past 2 months:  Single Family Home Source of Information:  Patient Patient Interpreter Needed:  None Criminal Activity/Legal Involvement Pertinent to Current Situation/Hospitalization:  No - Comment as needed Significant Relationships:  Adult Children Lives with:  Self Do you feel safe going back to the place where you live?  No Need for family participation in patient care:  No (Coment)  Care giving concerns:  Pt resides at home alone. Pt is a widow. Pt agreeable to SNF at discharge as she knows that she will need assistance. [name] as her family resides in [place].  CSW obtained permission to send to SNF's. CSW explained the SNF process/placement. CSW will set up transport to SNF at appropriate time as pt agreeable to same.  Social Worker assessment / plan:  CSW will assist with disposition.  Employment status:  Retired Forensic scientist:  Medicare PT Recommendations:  [place] / Referral to community resources:  [place]  Patient/Family's Response to care:  Patient agreeable to SNF and appreciative of CSW meeting to discuss disposition and thanked CSW for f/u.  Patient/Family's Understanding of and Emotional Response to Diagnosis, Current Treatment, and Prognosis:  Pt from home alone and acknowledges that she cannot return home given new impairment. Pt agreeable to SNF and desires [facility] or SNF in  [place]. Pt will return home once rehab completed as she was independent prior to new impairment. No issues or concerns identified. CSW will assist with disposition.  Emotional Assessment Appearance:  Appears stated age Attitude/Demeanor/Rapport:  (Cooperative) Affect (typically observed):  Accepting, Appropriate Orientation:  Oriented to Situation, Oriented to  Time, Oriented to Place, Oriented to Self Alcohol / Substance use:  Not Applicable Psych involvement (Current and /or in the community):  No (Comment)  Discharge Needs  Concerns to be addressed:  Discharge Planning Concerns Readmission within the last 30 days:  No Current discharge risk:  Dependent with Mobility, Physical Impairment Barriers to Discharge:  No Barriers Identified   Normajean Baxter, LCSW 05/05/2017, 11:33 AM

## 2017-05-05 NOTE — Evaluation (Signed)
Physical Therapy Evaluation Patient Details Name: [name] MRN: [id number] DOB: Sep 15, 1932 Today's Date: 05/05/2017   History of Present Illness  Admitted for L THA, Direct Anterior, WBAT;  has a pertinent past medical history of Arthritis, Atrial fibrillation -persistent, Bilateral lower extremity edema, Complication of anesthesia, Depression, , Dyspnea, Dysrhythmia, Fatty liver, H/O cardiac radiofrequency ablation, Heart failure with preserved ejection fraction ([place]), Heart murmur,  History of cardiomyopathy, , Hypothyroidism, Mild intermittent asthma, Mixed stress and urge urinary incontinence, Pulmonary nodule, S/P mitral valve repair and Wears glasses.  Clinical Impression   Pt is s/p THA resulting in the deficits listed below (see PT Problem List).  Pt will benefit from skilled PT to increase their independence and safety with mobility to allow discharge to the venue listed below.      Follow Up Recommendations Follow surgeon's recommendation for DC plan and follow-up therapies    Equipment Recommendations  Rolling walker with 5" wheels;3in1 (PT)(may already have)    Recommendations for Other Services OT consult(as ordered)     Precautions / Restrictions Precautions Precautions: None Restrictions Weight Bearing Restrictions: No LLE Weight Bearing: Weight bearing as tolerated      Mobility  Bed Mobility Overal bed mobility: Needs Assistance Bed Mobility: Supine to Sit     Supine to sit: Min assist     General bed mobility comments: Cues for techqniue; min assist to pull to sit  Transfers Overall transfer level: Needs assistance Equipment used: Rolling walker (2 wheeled) Transfers: Sit to/from Stand Sit to Stand: Min assist         General transfer comment: Min assist to power up and steady during transition of hands bed to RW  Ambulation/Gait Ambulation/Gait assistance: Min guard Ambulation Distance (Feet): (Hallway ambulation) Assistive device: Rolling  walker (2 wheeled) Gait Pattern/deviations: Step-through pattern(emerging)     General Gait Details: Cues to self-monitor for activity tolerance  Stairs            Wheelchair Mobility    Modified Rankin (Stroke Patients Only)       Balance                                             Pertinent Vitals/Pain Pain Assessment: 0-10 Pain Score: 4  Pain Location: L hip Pain Descriptors / Indicators: Aching;Operative site guarding Pain Intervention(s): Monitored during session    Home Living Family/patient expects to be discharged to:: Skilled nursing facility                      Prior Function Level of Independence: Independent               Hand Dominance        Extremity/Trunk Assessment   Upper Extremity Assessment Upper Extremity Assessment: Defer to OT evaluation    Lower Extremity Assessment Lower Extremity Assessment: LLE deficits/detail LLE Deficits / Details: Grosssly decr AROM and strength, limited by postop pain       Communication   Communication: No difficulties  Cognition Arousal/Alertness: Awake/alert Behavior During Therapy: WFL for tasks assessed/performed Overall Cognitive Status: Within Functional Limits for tasks assessed                                        General Comments  Exercises Total Joint Exercises Ankle Circles/Pumps: AROM;Both;10 reps Quad Sets: AROM;Left;5 reps Gluteal Sets: AROM;Both;5 reps Towel Squeeze: AROM;Both;5 reps Heel Slides: AROM;Left;5 reps Hip ABduction/ADduction: AROM;Right;5 reps   Assessment/Plan    PT Assessment Patient needs continued PT services  PT Problem List Decreased strength;Decreased range of motion;Decreased activity tolerance;Decreased balance;Decreased mobility;Decreased knowledge of use of DME;Decreased knowledge of precautions;Pain       PT Treatment Interventions DME instruction;Gait training;Functional mobility  training;Therapeutic activities;Therapeutic exercise;Stair training;Balance training;Patient/family education    PT Goals (Current goals can be found in the Care Plan section)  Acute Rehab PT Goals Patient Stated Goal: walk without pain PT Goal Formulation: With patient Time For Goal Achievement: 05/12/17 Potential to Achieve Goals: Good    Frequency 7X/week   Barriers to discharge Decreased caregiver support      Co-evaluation               AM-PAC PT "6 Clicks" Daily Activity  Outcome Measure Difficulty turning over in bed (including adjusting bedclothes, sheets and blankets)?: A Lot Difficulty moving from lying on back to sitting on the side of the bed? : A Little Difficulty sitting down on and standing up from a chair with arms (e.g., wheelchair, bedside commode, etc,.)?: A Little Help needed moving to and from a bed to chair (including a wheelchair)?: A Little Help needed walking in hospital room?: A Little Help needed climbing 3-5 steps with a railing? : A Lot 6 Click Score: 16    End of Session Equipment Utilized During Treatment: Gait belt Activity Tolerance: Patient tolerated treatment well Patient left: in chair;with call bell/phone within reach;with nursing/sitter in room Nurse Communication: Mobility status PT Visit Diagnosis: Other abnormalities of gait and mobility (R26.89);Pain Pain - Right/Left: Left Pain - part of body: Hip    Time: 0920-0946 PT Time Calculation (min) (ACUTE ONLY): 26 min   Charges:   PT Evaluation $PT Eval Low Complexity: 1 Low PT Treatments $Gait Training: 8-22 mins   PT G Codes:        Roney Marion, PT  Acute Rehabilitation Services Pager [phone] Office [phone]   [name] 05/05/2017, 2:16 PM

## 2017-05-05 NOTE — Progress Notes (Signed)
Orthopedic Tech Progress Note Patient Details:  [name] [birth year] [id number]  Patient ID: [name], female   DOB: 01-Mar-1933, 84 y.o.   MRN: [id number] Pt cant have ohf due to age restrictions  [name] 05/05/2017, 7:00 PM

## 2017-05-05 NOTE — Progress Notes (Signed)
Physical Therapy Treatment Patient Details Name: [name] MRN: [id number] DOB: [birth year] Today's Date: 05/05/2017    History of Present Illness Admitted for L THA, Direct Anterior, WBAT;  has a pertinent past medical history of Arthritis, Atrial fibrillation -persistent, Bilateral lower extremity edema, Complication of anesthesia, Depression, , Dyspnea, Dysrhythmia, Fatty liver, H/O cardiac radiofrequency ablation, Heart failure with preserved ejection fraction ([place]), Heart murmur,  History of cardiomyopathy, , Hypothyroidism, Mild intermittent asthma, Mixed stress and urge urinary incontinence, Pulmonary nodule, S/P mitral valve repair and Wears glasses.    PT Comments    Continuing work on functional mobility and activity tolerance;  Very painful this afternoon, but willing to get up and continue work on progressive ambulation; more dependent on RW to [place] painful L hip this session; declined therex  Follow Up Recommendations  Follow surgeon's recommendation for DC plan and follow-up therapies     Equipment Recommendations  Rolling walker with 5" wheels;3in1 (PT)(may already have)    Recommendations for Other Services OT consult(as ordered)     Precautions / Restrictions Precautions Precautions: None Restrictions LLE Weight Bearing: Weight bearing as tolerated    Mobility  Bed Mobility Overal bed mobility: Needs Assistance Bed Mobility: Sit to Supine     Supine to sit: Min assist Sit to supine: Mod assist   General bed mobility comments: Mod assist to help LEs into bed  Transfers Overall transfer level: Needs assistance Equipment used: Rolling walker (2 wheeled) Transfers: Sit to/from Stand Sit to Stand: Min assist         General transfer comment: Min assist to power up and steady during transition of hands bed to RW  Ambulation/Gait Ambulation/Gait assistance: Min guard Ambulation Distance (Feet): (Hallway ambulation) Assistive device: Rolling walker  (2 wheeled) Gait Pattern/deviations: Step-through pattern Gait velocity: slowed Gait velocity interpretation: Below normal speed for age/gender General Gait Details: Cues to self-monitor for activity tolerance; more painful this afternoon; cues to use RW to unweigh sore L hip in stance   Stairs            Wheelchair Mobility    Modified Rankin (Stroke Patients Only)       Balance Overall balance assessment: Needs assistance           Standing balance-Leahy Scale: Poor Standing balance comment: Dependent on UE suport                            Cognition Arousal/Alertness: Awake/alert Behavior During Therapy: WFL for tasks assessed/performed Overall Cognitive Status: Within Functional Limits for tasks assessed                                        Exercises     General Comments General comments (skin integrity, edema, etc.): Ms. Portner declined therex this afternoon due to pain      Pertinent Vitals/Pain Pain Assessment: 0-10 Pain Score: 7  Pain Location: L hip Pain Descriptors / Indicators: Aching;Operative site guarding Pain Intervention(s): Monitored during session;Patient requesting pain meds-RN notified;RN gave pain meds during session    Home Living Family/patient expects to be discharged to:: Skilled nursing facility                    Prior Function Level of Independence: Independent          PT Goals (current goals can now be  found in the care plan section) Acute Rehab PT Goals Patient Stated Goal: walk without pain PT Goal Formulation: With patient Time For Goal Achievement: 05/12/17 Potential to Achieve Goals: Good Progress towards PT goals: Progressing toward goals(though limited by pain this pm)    Frequency    7X/week      PT Plan Current plan remains appropriate    Co-evaluation              AM-PAC PT "6 Clicks" Daily Activity  Outcome Measure  Difficulty turning over in bed  (including adjusting bedclothes, sheets and blankets)?: A Lot Difficulty moving from lying on back to sitting on the side of the bed? : A Lot Difficulty sitting down on and standing up from a chair with arms (e.g., wheelchair, bedside commode, etc,.)?: A Little Help needed moving to and from a bed to chair (including a wheelchair)?: A Little Help needed walking in hospital room?: A Lot Help needed climbing 3-5 steps with a railing? : A Lot 6 Click Score: 12    End of Session Equipment Utilized During Treatment: Gait belt Activity Tolerance: Patient tolerated treatment well Patient left: in chair;with call bell/phone within reach;with nursing/sitter in room Nurse Communication: Mobility status PT Visit Diagnosis: Other abnormalities of gait and mobility (R26.89);Pain Pain - Right/Left: Left Pain - part of body: Hip     Time: 9432-7614 PT Time Calculation (min) (ACUTE ONLY): 13 min  Charges:  $Gait Training: 8-22 mins                    G Codes:       Roney Marion, PT  Acute Rehabilitation Services Pager [phone] Office [phone]    [name] 05/05/2017, 3:23 PM

## 2017-05-05 NOTE — Discharge Instructions (Addendum)
Information on my medicine - Coumadin®   (Warfarin) ° °Why was Coumadin prescribed for you? °Coumadin was prescribed for you because you have a blood clot or a medical condition that can cause an increased risk of forming blood clots. Blood clots can cause serious health problems by blocking the flow of blood to the heart, lung, or brain. Coumadin can prevent harmful blood clots from forming. °As a reminder your indication for Coumadin is:   Stroke Prevention Because Of Atrial Fibrillation ° °What test will check on my response to Coumadin? °While on Coumadin (warfarin) you will need to have an INR test regularly to ensure that your dose is keeping you in the desired range. The INR (international normalized ratio) number is calculated from the result of the laboratory test called prothrombin time (PT). ° °If an INR APPOINTMENT HAS NOT ALREADY BEEN MADE FOR YOU please schedule an appointment to have this lab work done by your health care provider within 7 days. °Your INR goal is usually a number between:  2 to 3 or your provider may give you a more narrow range like 2-2.5.  Ask your health care provider during an office visit what your goal INR is. ° °What  do you need to  know  About  COUMADIN? °Take Coumadin (warfarin) exactly as prescribed by your healthcare provider about the same time each day.  DO NOT stop taking without talking to the doctor who prescribed the medication.  Stopping without other blood clot prevention medication to take the place of Coumadin may increase your risk of developing a new clot or stroke.  Get refills before you run out. ° °What do you do if you miss a dose? °If you miss a dose, take it as soon as you remember on the same day then continue your regularly scheduled regimen the next day.  Do not take two doses of Coumadin at the same time. ° °Important Safety Information °A possible side effect of Coumadin (Warfarin) is an increased risk of bleeding. You should call your healthcare  provider right away if you experience any of the following: °? Bleeding from an injury or your nose that does not stop. °? Unusual colored urine (red or dark brown) or unusual colored stools (red or black). °? Unusual bruising for unknown reasons. °? A serious fall or if you hit your head (even if there is no bleeding). ° °Some foods or medicines interact with Coumadin® (warfarin) and might alter your response to warfarin. To help avoid this: °? Eat a balanced diet, maintaining a consistent amount of Vitamin K. °? Notify your provider about major diet changes you plan to make. °? Avoid alcohol or limit your intake to 1 drink for women and 2 drinks for men per day. °(1 drink is 5 oz. wine, 12 oz. beer, or 1.5 oz. liquor.) ° °Make sure that ANY health care provider who prescribes medication for you knows that you are taking Coumadin (warfarin).  Also make sure the healthcare provider who is monitoring your Coumadin knows when you have started a new medication including herbals and non-prescription products. ° °Coumadin® (Warfarin)  Major Drug Interactions  °Increased Warfarin Effect Decreased Warfarin Effect  °Alcohol (large quantities) °Antibiotics (esp. Septra/Bactrim, Flagyl, Cipro) °Amiodarone (Cordarone) °Aspirin (ASA) °Cimetidine (Tagamet) °Megestrol (Megace) °NSAIDs (ibuprofen, naproxen, etc.) °Piroxicam (Feldene) °Propafenone (Rythmol SR) °Propranolol (Inderal) °Isoniazid (INH) °Posaconazole (Noxafil) Barbiturates (Phenobarbital) °Carbamazepine (Tegretol) °Chlordiazepoxide (Librium) °Cholestyramine (Questran) °Griseofulvin °Oral Contraceptives °Rifampin °Sucralfate (Carafate) °Vitamin K  ° °Coumadin® (Warfarin) Major Herbal   Interactions  Increased Warfarin Effect Decreased Warfarin Effect  Garlic Ginseng Ginkgo biloba Coenzyme Q10 Green tea St. Johns wort    Coumadin (Warfarin) FOOD Interactions  Eat a consistent number of servings per week of foods HIGH in Vitamin K (1 serving =  cup)  Collards  (cooked, or boiled & drained) Kale (cooked, or boiled & drained) Mustard greens (cooked, or boiled & drained) Parsley *serving size only =  cup Spinach (cooked, or boiled & drained) Swiss chard (cooked, or boiled & drained) Turnip greens (cooked, or boiled & drained)  Eat a consistent number of servings per week of foods MEDIUM-HIGH in Vitamin K (1 serving = 1 cup)  Asparagus (cooked, or boiled & drained) Broccoli (cooked, boiled & drained, or raw & chopped) Brussel sprouts (cooked, or boiled & drained) *serving size only =  cup Lettuce, raw (green leaf, endive, romaine) Spinach, raw Turnip greens, raw & chopped   These websites have more information on Coumadin (warfarin):  FailFactory.se; VeganReport.com.au;   INSTRUCTIONS AFTER JOINT REPLACEMENT   o Remove items at home which could result in a fall. This includes throw rugs or furniture in walking pathways o ICE to the affected joint every three hours while awake for 30 minutes at a time, for at least the first 3-5 days, and then as needed for pain and swelling.  Continue to use ice for pain and swelling. You may notice swelling that will progress down to the foot and ankle.  This is normal after surgery.  Elevate your leg when you are not up walking on it.   o Continue to use the breathing machine you got in the hospital (incentive spirometer) which will help keep your temperature down.  It is common for your temperature to cycle up and down following surgery, especially at night when you are not up moving around and exerting yourself.  The breathing machine keeps your lungs expanded and your temperature down.   DIET:  As you were doing prior to hospitalization, we recommend a well-balanced diet.  DRESSING / WOUND CARE / SHOWERING  Keep the surgical dressing until follow up.  The dressing is water proof, so you can shower without any extra covering.  IF THE DRESSING FALLS OFF or the wound gets wet inside, change  the dressing with sterile gauze.  Please use good hand washing techniques before changing the dressing.  Do not use any lotions or creams on the incision until instructed by your surgeon.    ACTIVITY  o Increase activity slowly as tolerated, but follow the weight bearing instructions below.   o No driving for 6 weeks or until further direction given by your physician.  You cannot drive while taking narcotics.  o No lifting or carrying greater than 10 lbs. until further directed by your surgeon. o Avoid periods of inactivity such as sitting longer than an hour when not asleep. This helps prevent blood clots.  o You may return to work once you are authorized by your doctor.     WEIGHT BEARING   Weight bearing as tolerated with assist device (walker, cane, etc) as directed, use it as long as suggested by your surgeon or therapist, typically at least 4-6 weeks.   EXERCISES  Results after joint replacement surgery are often greatly improved when you follow the exercise, range of motion and muscle strengthening exercises prescribed by your doctor. Safety measures are also important to protect the joint from further injury. Any time any of these exercises cause you to  have increased pain or swelling, decrease what you are doing until you are comfortable again and then slowly increase them. If you have problems or questions, call your caregiver or physical therapist for advice.   Rehabilitation is important following a joint replacement. After just a few days of immobilization, the muscles of the leg can become weakened and shrink (atrophy).  These exercises are designed to build up the tone and strength of the thigh and leg muscles and to improve motion. Often times heat used for twenty to thirty minutes before working out will loosen up your tissues and help with improving the range of motion but do not use heat for the first two weeks following surgery (sometimes heat can increase post-operative  swelling).   These exercises can be done on a training (exercise) mat, on the floor, on a table or on a bed. Use whatever works the best and is most comfortable for you.    Use music or television while you are exercising so that the exercises are a pleasant break in your day. This will make your life better with the exercises acting as a break in your routine that you can look forward to.   Perform all exercises about fifteen times, three times per day or as directed.  You should exercise both the operative leg and the other leg as well.  Exercises include:    Quad Sets - Tighten up the muscle on the front of the thigh (Quad) and hold for 5-10 seconds.    Straight Leg Raises - With your knee straight (if you were given a brace, keep it on), lift the leg to 60 degrees, hold for 3 seconds, and slowly lower the leg.  Perform this exercise against resistance later as your leg gets stronger.   Leg Slides: Lying on your back, slowly slide your foot toward your buttocks, bending your knee up off the floor (only go as far as is comfortable). Then slowly slide your foot back down until your leg is flat on the floor again.   Angel Wings: Lying on your back spread your legs to the side as far apart as you can without causing discomfort.   Hamstring Strength:  Lying on your back, push your heel against the floor with your leg straight by tightening up the muscles of your buttocks.  Repeat, but this time bend your knee to a comfortable angle, and push your heel against the floor.  You may put a pillow under the heel to make it more comfortable if necessary.   A rehabilitation program following joint replacement surgery can speed recovery and prevent re-injury in the future due to weakened muscles. Contact your doctor or a physical therapist for more information on knee rehabilitation.    CONSTIPATION  Constipation is defined medically as fewer than three stools per week and severe constipation as less than  one stool per week.  Even if you have a regular bowel pattern at home, your normal regimen is likely to be disrupted due to multiple reasons following surgery.  Combination of anesthesia, postoperative narcotics, change in appetite and fluid intake all can affect your bowels.   YOU MUST use at least one of the following options; they are listed in order of increasing strength to get the job done.  They are all available over the counter, and you may need to use some, POSSIBLY even all of these options:    Drink plenty of fluids (prune juice may be helpful) and high fiber  foods Colace 100 mg by mouth twice a day  Senokot for constipation as directed and as needed Dulcolax (bisacodyl), take with full glass of water  Miralax (polyethylene glycol) once or twice a day as needed.  If you have tried all these things and are unable to have a bowel movement in the first 3-4 days after surgery call either your surgeon or your primary doctor.    If you experience loose stools or diarrhea, hold the medications until you stool forms back up.  If your symptoms do not get better within 1 week or if they get worse, check with your doctor.  If you experience "the worst abdominal pain ever" or develop nausea or vomiting, please contact the office immediately for further recommendations for treatment.   ITCHING:  If you experience itching with your medications, try taking only a single pain pill, or even half a pain pill at a time.  You can also use Benadryl over the counter for itching or also to help with sleep.   TED HOSE STOCKINGS:  Use stockings on both legs until for at least 2 weeks or as directed by physician office. They may be removed at night for sleeping.  MEDICATIONS:  See your medication summary on the After Visit Summary that nursing will review with you.  You may have some home medications which will be placed on hold until you complete the course of blood thinner medication.  It is important for  you to complete the blood thinner medication as prescribed.  PRECAUTIONS:  If you experience chest pain or shortness of breath - call 911 immediately for transfer to the hospital emergency department.   If you develop a fever greater that 101 F, purulent drainage from wound, increased redness or drainage from wound, foul odor from the wound/dressing, or calf pain - CONTACT YOUR SURGEON.                                                   FOLLOW-UP APPOINTMENTS:  If you do not already have a post-op appointment, please call the office for an appointment to be seen by your surgeon.  Guidelines for how soon to be seen are listed in your After Visit Summary, but are typically between 1-4 weeks after surgery.  OTHER INSTRUCTIONS:   Knee Replacement:  Do not place pillow under knee, focus on keeping the knee straight while resting. CPM instructions: 0-90 degrees, 2 hours in the morning, 2 hours in the afternoon, and 2 hours in the evening. Place foam block, curve side up under heel at all times except when in CPM or when walking.  DO NOT modify, tear, cut, or change the foam block in any way.  MAKE SURE YOU:   Understand these instructions.   Get help right away if you are not doing well or get worse.    Thank you for letting us be a part of your medical care team.  It is a privilege we respect greatly.  We hope these instructions will help you stay on track for a fast and full recovery!

## 2017-05-05 NOTE — Progress Notes (Signed)
ANTICOAGULATION CONSULT NOTE - Initial Consult  Pharmacy Consult:  Coumadin Indication: atrial fibrillation  Allergies  Allergen Reactions  . Amiodarone Hcl Swelling    SWELLING REACTION UNSPECIFIED   . Penicillins Rash    Has patient had a PCN reaction causing immediate rash, facial/tongue/throat swelling, SOB or lightheadedness with hypotension: No Has patient had a PCN reaction causing severe rash involving mucus membranes or skin necrosis: No Has patient had a PCN reaction that required hospitalization:Patient was inpatient when reaction occurred Has patient had a PCN reaction occurring within the last 10 years: No If all of the above answers are "NO", then may proceed with Cephalosporin use.   . Statins Rash    Patient Measurements: Height: 5\' 5"  (165.1 cm) Weight: 184 lb 3.2 oz (83.6 kg) IBW/kg (Calculated) : 57  Assessment: 84 YOF on Coumadin 5mg  daily exc for 2.5mg  on MWF PTA for Afib. INR was recently 2.8 on this regimen. Last dose 2/5 Then bridged with Lovenox for left THA 2/12. Coumadin restarted on 2/12. INR 1.18. Hgb 10.1, plts 117.  Goal of Therapy:  INR 2-3 Monitor platelets by anticoagulation protocol: Yes   Plan:  Coumadin 5mg  PO x 1 tonight Monitor daily heparin level, CBC, s/s of bleed  Elenor Quinones, PharmD, [facility] Clinical Pharmacist Pager [phone] 05/05/2017 8:36 AM

## 2017-05-06 LAB — PROTIME-INR
INR: 1.31
Prothrombin Time: 16.2 seconds — ABNORMAL HIGH (ref 11.4–15.2)

## 2017-05-06 LAB — CBC
HCT: 31.5 % — ABNORMAL LOW (ref 36.0–46.0)
Hemoglobin: 9.9 g/dL — ABNORMAL LOW (ref 12.0–15.0)
MCH: 27.3 pg (ref 26.0–34.0)
MCHC: 31.4 g/dL (ref 30.0–36.0)
MCV: 87 fL (ref 78.0–100.0)
PLATELETS: 111 10*3/uL — AB (ref 150–400)
RBC: 3.62 MIL/uL — ABNORMAL LOW (ref 3.87–5.11)
RDW: 15.1 % (ref 11.5–15.5)
WBC: 7.4 10*3/uL (ref 4.0–10.5)

## 2017-05-06 MED ORDER — WARFARIN SODIUM 5 MG PO TABS
5.0000 mg | ORAL_TABLET | Freq: Once | ORAL | Status: AC
  Administered 2017-05-06: 5 mg via ORAL
  Filled 2017-05-06: qty 1

## 2017-05-06 MED ORDER — PROMETHAZINE HCL 25 MG/ML IJ SOLN
12.5000 mg | Freq: Four times a day (QID) | INTRAMUSCULAR | Status: DC | PRN
  Administered 2017-05-06: 12.5 mg via INTRAVENOUS
  Filled 2017-05-06: qty 1

## 2017-05-06 MED ORDER — PROMETHAZINE HCL 12.5 MG PO TABS: 12.5000 mg | ORAL_TABLET | Freq: Four times a day (QID) | ORAL | 0 refills | Status: DC | PRN

## 2017-05-06 MED ORDER — HYDROCODONE-ACETAMINOPHEN 5-325 MG PO TABS: 1.0000 | ORAL_TABLET | ORAL | 0 refills | Status: DC | PRN

## 2017-05-06 NOTE — Progress Notes (Signed)
PT Cancellation Note  Patient Details Name: [name] MRN: [id number] DOB: 28-Mar-1932   Cancelled Treatment:     Attempted to work with patient at 1710. Patient refusing adamantly and is still having nausea. Educated on ankle pumps and benefits of mobility, patient also less alert than prior attempt this AM. Of note, patient has not moved from bed entire day today. Will re-attempt tomorrow 2/15. RN notified and aware.    Reinaldo Berber, PT, DPT Acute Rehab Services Pager: [phone]     Reinaldo Berber 05/06/2017, 5:14 PM

## 2017-05-06 NOTE — Progress Notes (Signed)
Subjective: 2 Days Post-Op Procedure(s) (LRB): LEFT TOTAL HIP ARTHROPLASTY ANTERIOR APPROACH (Left) Patient reports pain as moderate.  Significant nausea.  Acute blood loss anemia from surgery, but stable vitals.  Objective: Vital signs in last 24 hours: Temp:  [97.7 F (36.5 C)-98.4 F (36.9 C)] 98.4 F (36.9 C) (02/14 1331) Pulse Rate:  [91-118] 118 (02/14 1331) Resp:  [16] 16 (02/14 1331) BP: (106-124)/(64-88) 109/88 (02/14 1331) SpO2:  [93 %-97 %] 93 % (02/14 1331)  Intake/Output from previous day: 02/13 0701 - 02/14 0700 In: 720 [P.O.:720] Out: -  Intake/Output this shift: Total I/O In: 240 [P.O.:240] Out: -   Recent Labs    05/05/17 0701 05/06/17 0428  HGB 10.1* 9.9*   Recent Labs    05/05/17 0701 05/06/17 0428  WBC 5.5 7.4  RBC 3.68* 3.62*  HCT 31.7* 31.5*  PLT 117* 111*   Recent Labs    05/05/17 0701  NA 136  K 4.0  CL 100*  CO2 24  BUN 15  CREATININE 0.80  GLUCOSE 145*  CALCIUM 7.8*   Recent Labs    05/05/17 0701 05/06/17 0428  INR 1.18 1.31    Sensation intact distally Intact pulses distally Dorsiflexion/Plantar flexion intact Incision: dressing C/D/I  Assessment/Plan: 2 Days Post-Op Procedure(s) (LRB): LEFT TOTAL HIP ARTHROPLASTY ANTERIOR APPROACH (Left) Up with therapy Plan for discharge tomorrow Discharge to SNF  Mcarthur Rossetti 05/06/2017, 5:32 PM

## 2017-05-06 NOTE — Plan of Care (Signed)
  Education: Knowledge of General Education information will improve 05/06/2017 0451 - Progressing by Anson Fret, RN Note POC reviewed with pt.

## 2017-05-06 NOTE — Progress Notes (Signed)
ANTICOAGULATION CONSULT NOTE - Follow Up Consult  Pharmacy Consult for Coumadin Indication: atrial fibrillation  Allergies  Allergen Reactions  . Amiodarone Hcl Swelling    SWELLING REACTION UNSPECIFIED   . Penicillins Rash    Has patient had a PCN reaction causing immediate rash, facial/tongue/throat swelling, SOB or lightheadedness with hypotension: No Has patient had a PCN reaction causing severe rash involving mucus membranes or skin necrosis: No Has patient had a PCN reaction that required hospitalization:Patient was inpatient when reaction occurred Has patient had a PCN reaction occurring within the last 10 years: No If all of the above answers are "NO", then may proceed with Cephalosporin use.   . Statins Rash    Patient Measurements: Height: 5\' 5"  (165.1 cm) Weight: 184 lb 3.2 oz (83.6 kg) IBW/kg (Calculated) : 57  Vital Signs: Temp: 98.2 F (36.8 C) (02/14 0514) Temp Source: Oral (02/14 0514) BP: 124/64 (02/14 0800) Pulse Rate: 105 (02/14 0514)  Labs: Recent Labs    05/04/17 1034 05/05/17 0701 05/06/17 0428  HGB  --  10.1* 9.9*  HCT  --  31.7* 31.5*  PLT  --  117* 111*  LABPROT 14.6 14.9 16.2*  INR 1.15 1.18 1.31  CREATININE  --  0.80  --     Estimated Creatinine Clearance: 57.9 mL/min (by C-G formula based on SCr of 0.8 mg/dL).  Assessment: 84 year old female on Coumadin prior to admission for atrial fibrillation who was admitted 2/12 for left total hip arthroplasty. Patient's last dose of Coumadin prior to admission was on 2/5 and she was bridged with Lovenox for L-THA 2/12. Her home regimen was Coumadin 5mg  daily except 2.5mg  on Mondays, Wednesdays, and Fridays.   Coumadin was resumed 2/12 post-operatively. INR today is up to 1.31. Hgb is stable at 9.9. Platelets are low at 111 but stable. No bleeding is noted.   Goal of Therapy:  INR 2-3 Monitor platelets by anticoagulation protocol: Yes   Plan:  Coumadin 5 mg po x 1 tonight per home regimen (not  patient received elevated doses on 2/12 and 2/13). Daily PT/INR.  Sloan Leiter, PharmD, BCPS, BCCCP Clinical Pharmacist Clinical phone 05/06/2017 until 3:[phone] After hours, please call #[phone] 05/06/2017,10:21 AM

## 2017-05-06 NOTE — Progress Notes (Signed)
PT Cancellation Note  Patient Details Name: [name] MRN: [id number] DOB: 1932-11-27   Cancelled Treatment:    Reason Eval/Treat Not Completed: Patient declined, no reason specified Attempted to work with patient at 0915, patient currently with nausea limiting ability to participate. Will re-visit later today, pt agreeable.    Reinaldo Berber, PT, DPT Acute Rehab Services Pager: [phone]    Reinaldo Berber 05/06/2017, 9:30 AM

## 2017-05-06 NOTE — Progress Notes (Signed)
Dr Jean Rosenthal was notified of the patient's multiple nausea and vomiting episodes today.  New order for nausea/vomiting medication obtained.

## 2017-05-06 NOTE — NC FL2 (Signed)
[place] LEVEL OF CARE SCREENING TOOL     IDENTIFICATION  Patient Name: [name] Birthdate: Apr 02, 1932 Sex: female Admission Date (Current Location): 05/04/2017  [facility] [place] Number:  Herbalist and Address:  [facility], [place] [address], [place], [place] [address]      Provider Number: [id number]  Attending Physician Name and Address:  Mcarthur Rossetti,*  Relative Name and Phone Number:  Semaja Lymon, spouse cell, [phone]    Current Level of Care: Hospital Recommended Level of Care: [place] Prior Approval Number:    Date Approved/Denied:   PASRR Number: [id number]  Discharge Plan: SNF    Current Diagnoses: Patient Active Problem List   Diagnosis Date Noted  . Unilateral primary osteoarthritis, left hip 05/04/2017  . Status post total replacement of left hip 05/04/2017  . Hip osteoarthritis 04/27/2017  . Long term (current) use of anticoagulants 03/04/2017  . Venous stasis dermatitis of both lower extremities 01/08/2017  . Impacted cerumen of right ear 11/20/2016  . Osteopenia 10/25/2016  . Pedal edema 08/26/2016  . Varicose veins of both lower extremities 08/26/2016  . Estrogen deficiency 08/26/2016  . Screening mammogram, encounter for 08/26/2016  . Hemorrhoids 08/26/2016  . Epistaxis 01/07/2016  . Urticaria 08/22/2014  . Hip pain 08/02/2014  . Left knee pain 08/02/2014  . Chronic cough 05/08/2014  . Caregiver stress 08/16/2013  . Colon cancer screening 08/16/2013  . Encounter for therapeutic drug monitoring 04/20/2013  . Left ovarian cyst 03/14/2013  . (HFpEF) heart failure with preserved ejection fraction ([place]) 12/27/2012  . Cardiomyopathy, secondary --Resolved again 10/14 10/13/2010  . COLONIC POLYPS, ADENOMATOUS, HX OF 09/18/2009  . PULMONARY NODULE 12/20/2008  . GANGLION CYST 10/04/2007  . Hyperlipidemia 04/27/2007  . DEPRESSION 04/27/2007  . Asthma, mild  intermittent 04/27/2007  . INSOMNIA 04/27/2007  . ADENOMATOUS COLONIC POLYP 11/04/2006  . Hypothyroidism 09/02/2006  . Atrial fibrillation ([place]) 08/05/2006    Orientation RESPIRATION BLADDER Height & Weight     Self, Time, Situation, Place  Normal Continent Weight: 184 lb 3.2 oz (83.6 kg) Height:  5\' 5"  (165.1 cm)  BEHAVIORAL SYMPTOMS/MOOD NEUROLOGICAL BOWEL NUTRITION STATUS      Continent Diet(See DC Summary)  AMBULATORY STATUS COMMUNICATION OF NEEDS Skin   Limited Assist Verbally Surgical wounds                       Personal Care Assistance Level of Assistance  Feeding, Bathing, Dressing Bathing Assistance: Limited assistance Feeding assistance: Independent Dressing Assistance: Maximum assistance     Functional Limitations Info  Sight, Hearing, Speech Sight Info: Adequate Hearing Info: Adequate Speech Info: Adequate    SPECIAL CARE FACTORS FREQUENCY  PT (By licensed PT), OT (By licensed OT)     PT Frequency: 5x week OT Frequency: 5x week            Contractures      Additional Factors Info  Code Status, Allergies Code Status Info: Full Allergies Info: AMIODARONE HCL, PENICILLINS, STATINS            Current Medications (05/06/2017):  This is the current hospital active medication list Current Facility-Administered Medications  Medication Dose Route Frequency Provider Last Rate Last Dose  . 0.9 %  sodium chloride infusion   Intravenous Continuous Mcarthur Rossetti, MD 75 mL/hr at 05/04/17 1536    . acetaminophen (TYLENOL) tablet 650 mg  650 mg Oral Q4H PRN Mcarthur Rossetti, MD  Or  . acetaminophen (TYLENOL) suppository 650 mg  650 mg Rectal Q4H PRN Mcarthur Rossetti, MD      . albuterol (PROVENTIL) (2.5 MG/3ML) 0.083% nebulizer solution 3 mL  3 mL Inhalation Q4H PRN Mcarthur Rossetti, MD      . alum & mag hydroxide-simeth (MAALOX/MYLANTA) 200-200-20 MG/5ML suspension 30 mL  30 mL Oral Q4H PRN Mcarthur Rossetti, MD       . cholecalciferol (VITAMIN D) tablet 4,000 Units  4,000 Units Oral Daily Mcarthur Rossetti, MD   4,000 Units at 05/06/17 0759  . diltiazem (CARDIZEM CD) 24 hr capsule 180 mg  180 mg Oral Daily Mcarthur Rossetti, MD   180 mg at 05/06/17 0800  . diphenhydrAMINE (BENADRYL) 12.5 MG/5ML elixir 12.5-25 mg  12.5-25 mg Oral Q4H PRN Mcarthur Rossetti, MD      . docusate sodium (COLACE) capsule 100 mg  100 mg Oral BID Mcarthur Rossetti, MD   100 mg at 05/06/17 0758  . fluticasone (FLONASE) 50 MCG/ACT nasal spray 1 spray  1 spray Each Nare Daily PRN Mcarthur Rossetti, MD      . furosemide (LASIX) tablet 20 mg  20 mg Oral Daily Mcarthur Rossetti, MD   20 mg at 05/06/17 0759  . HYDROcodone-acetaminophen (NORCO/VICODIN) 5-325 MG per tablet 1-2 tablet  1-2 tablet Oral Q4H PRN Mcarthur Rossetti, MD   2 tablet at 05/05/17 1747  . HYDROmorphone (DILAUDID) injection 0.5 mg  0.5 mg Intravenous Q2H PRN Mcarthur Rossetti, MD      . levothyroxine (SYNTHROID, LEVOTHROID) tablet 25 mcg  25 mcg Oral QAC breakfast Mcarthur Rossetti, MD   25 mcg at 05/06/17 0758  . loratadine (CLARITIN) tablet 10 mg  10 mg Oral Daily PRN Mcarthur Rossetti, MD      . menthol-cetylpyridinium (CEPACOL) lozenge 3 mg  1 lozenge Oral PRN Mcarthur Rossetti, MD       Or  . phenol (CHLORASEPTIC) mouth spray 1 spray  1 spray Mouth/Throat PRN Mcarthur Rossetti, MD      . methocarbamol (ROBAXIN) tablet 500 mg  500 mg Oral Q6H PRN Mcarthur Rossetti, MD   500 mg at 05/06/17 5366   Or  . methocarbamol (ROBAXIN) 500 mg in dextrose 5 % 50 mL IVPB  500 mg Intravenous Q6H PRN Mcarthur Rossetti, MD      . metoCLOPramide (REGLAN) tablet 5-10 mg  5-10 mg Oral Q8H PRN Mcarthur Rossetti, MD       Or  . metoCLOPramide (REGLAN) injection 5-10 mg  5-10 mg Intravenous Q8H PRN Mcarthur Rossetti, MD      . ondansetron [facility]) tablet 4 mg  4 mg Oral Q6H PRN Mcarthur Rossetti, MD       Or  . ondansetron [facility]) injection 4 mg  4 mg Intravenous Q6H PRN Mcarthur Rossetti, MD   4 mg at 05/06/17 0756  . oxyCODONE (Oxy IR/ROXICODONE) immediate release tablet 5-10 mg  5-10 mg Oral Q3H PRN Mcarthur Rossetti, MD   5 mg at 05/06/17 0759  . vitamin B-12 (CYANOCOBALAMIN) tablet 1,000 mcg  1,000 mcg Oral Daily Mcarthur Rossetti, MD   1,000 mcg at 05/06/17 0800  . Warfarin - Pharmacist Dosing Inpatient   Does not apply q1800 Tyrone Apple, [facility]         Discharge Medications: Please see discharge summary for a list of discharge medications.  Relevant Imaging Results:  Relevant Lab  Results:   Additional Information SS#: [id number]  [place], LCSW

## 2017-05-07 DIAGNOSIS — S79911A Unspecified injury of right hip, initial encounter: Secondary | ICD-10-CM | POA: Diagnosis not present

## 2017-05-07 DIAGNOSIS — Z96642 Presence of left artificial hip joint: Secondary | ICD-10-CM | POA: Diagnosis not present

## 2017-05-07 DIAGNOSIS — M79605 Pain in left leg: Secondary | ICD-10-CM | POA: Diagnosis not present

## 2017-05-07 DIAGNOSIS — R2689 Other abnormalities of gait and mobility: Secondary | ICD-10-CM | POA: Diagnosis not present

## 2017-05-07 DIAGNOSIS — I509 Heart failure, unspecified: Secondary | ICD-10-CM | POA: Diagnosis not present

## 2017-05-07 DIAGNOSIS — G8911 Acute pain due to trauma: Secondary | ICD-10-CM | POA: Diagnosis not present

## 2017-05-07 DIAGNOSIS — I4891 Unspecified atrial fibrillation: Secondary | ICD-10-CM | POA: Diagnosis not present

## 2017-05-07 DIAGNOSIS — Z4789 Encounter for other orthopedic aftercare: Secondary | ICD-10-CM | POA: Diagnosis not present

## 2017-05-07 DIAGNOSIS — E039 Hypothyroidism, unspecified: Secondary | ICD-10-CM | POA: Diagnosis not present

## 2017-05-07 LAB — CBC
HEMATOCRIT: 31.5 % — AB (ref 36.0–46.0)
Hemoglobin: 9.8 g/dL — ABNORMAL LOW (ref 12.0–15.0)
MCH: 27.2 pg (ref 26.0–34.0)
MCHC: 31.1 g/dL (ref 30.0–36.0)
MCV: 87.5 fL (ref 78.0–100.0)
PLATELETS: 117 10*3/uL — AB (ref 150–400)
RBC: 3.6 MIL/uL — ABNORMAL LOW (ref 3.87–5.11)
RDW: 15.3 % (ref 11.5–15.5)
WBC: 7 10*3/uL (ref 4.0–10.5)

## 2017-05-07 LAB — BASIC METABOLIC PANEL
Anion gap: 12 (ref 5–15)
BUN: 12 mg/dL (ref 6–20)
CHLORIDE: 100 mmol/L — AB (ref 101–111)
CO2: 27 mmol/L (ref 22–32)
Calcium: 8 mg/dL — ABNORMAL LOW (ref 8.9–10.3)
Creatinine, Ser: 0.74 mg/dL (ref 0.44–1.00)
GFR calc Af Amer: >60 mL/min (ref >60)
GLUCOSE: 101 mg/dL — AB (ref 65–99)
Potassium: 4.1 mmol/L (ref 3.5–5.1)
Sodium: 139 mmol/L (ref 135–145)

## 2017-05-07 LAB — PROTIME-INR
INR: 1.59
Prothrombin Time: 18.8 seconds — ABNORMAL HIGH (ref 11.4–15.2)

## 2017-05-07 MED ORDER — WARFARIN SODIUM 5 MG PO TABS: 5.0000 mg | ORAL_TABLET | Freq: Once | ORAL | Status: DC

## 2017-05-07 NOTE — Clinical Social Work Placement (Signed)
   CLINICAL SOCIAL WORK PLACEMENT  NOTE  Date:  05/07/2017  Patient Details  Name: [name] MRN: [id number] Date of Birth: 25-Nov-1932  Clinical Social Work is seeking post-discharge placement for this patient at the [place] level of care (*CSW will initial, date and re-position this form in  chart as items are completed):  Yes   Patient/family provided with [place] Work Department's list of facilities offering this level of care within the geographic area requested by the patient (or if unable, by the patient's family).  Yes   Patient/family informed of their freedom to choose among providers that offer the needed level of care, that participate in Medicare, Medicaid or managed care program needed by the patient, have an available bed and are willing to accept the patient.  Yes   Patient/family informed of [facility]'s ownership interest in [facility] and [facility], as well as of the fact that they are under no obligation to receive care at these facilities.  PASRR submitted to EDS on       PASRR number received on 05/05/17     Existing PASRR number confirmed on       FL2 transmitted to all facilities in geographic area requested by pt/family on 05/06/17     FL2 transmitted to all facilities within larger geographic area on       Patient informed that his/her managed care company has contracts with or will negotiate with certain facilities, including the following:        Yes   Patient/family informed of bed offers received.  Patient chooses bed at [facility]     Physician recommends and patient chooses bed at      Patient to be transferred to [facility] on 05/07/17.  Patient to be transferred to facility by [facility]     Patient family notified on 05/07/17 of transfer.  Name of family member notified:  pt reponsible for for self     PHYSICIAN       Additional Comment:     _______________________________________________ Normajean Baxter, LCSW 05/07/2017, 9:33 AM

## 2017-05-07 NOTE — Discharge Summary (Signed)
Patient ID: NEMIAH Key MRN: [id number] DOB/AGE: March 25, 1932 84 y.o.  Admit date: 05/04/2017 Discharge date: 05/07/2017  Admission Diagnoses:  Principal Problem:   Unilateral primary osteoarthritis, left hip Active Problems:   Status post total replacement of left hip   Discharge Diagnoses:  Same  Past Medical History:  Diagnosis Date  . Allergic rhinitis   . 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 Arthritis   . Atrial fibrillation -persistent cardiologist-  dr klein/  primary EP -- dr Tawanna Sat ([facility])   a. s/p PVI [name] 2010;  b. on tikosyn/coumadin;  c. 05/2009 Echo: EF 60-65%, Gr 2 DD. (first dx 09/ 2007)  . Bilateral lower extremity edema   . Bleeding hemorrhoid   . Carotid stenosis    mild (hosp 3/11)- consult by vasc/ Dr Donnetta Hutching  . Complication of anesthesia    hard to wake  . Depression    denies  . Diverticulosis of colon   . Dyspnea    on exertion-climbing stairs  . Dysrhythmia   . Fatty liver   . H/O cardiac radiofrequency ablation    01/ 2008 at [facility] /  03/ 2010  at [facility]  . Heart failure with preserved ejection fraction ([place])   . Heart murmur    "prior to valve repair"  . History of adenomatous polyp of colon    tubular adenoma's  . History of cardiomyopathy    secondary tachycardia-induced cardiomyopathy -- resolved 2014  . History of squamous cell carcinoma in situ (SCCIS) of skin    05/ 2017  nasal bridge and right medial knee  . History of transient ischemic attack (TIA)    01-24-2005 and 06-12-2009  . Hyperlipidemia   . Hypothyroidism   . Mild intermittent asthma    reacts to cats  . Mixed stress and urge urinary incontinence   . Pulmonary nodule   . S/P mitral valve repair 10-23-1998  dr Boyce Medici at [facility]   for MVP and regurg. (annuloplasty ring procedure)  . Swelling of left extremity 2017   states it's gotten worse  . Varicose vein of leg   . Wears glasses     Surgeries: Procedure(s): LEFT TOTAL HIP ARTHROPLASTY  ANTERIOR APPROACH on 05/04/2017   Consultants:   Discharged Condition: Improved  Hospital Course: LORALEI RADCLIFFE is an 84 y.o. female who was admitted 05/04/2017 for operative treatment ofUnilateral primary osteoarthritis, left hip. Patient has severe unremitting pain that affects sleep, daily activities, and work/hobbies. After pre-op clearance the patient was taken to the operating room on 05/04/2017 and underwent  Procedure(s): LEFT TOTAL HIP ARTHROPLASTY ANTERIOR APPROACH.    Patient was given perioperative antibiotics:  Anti-infectives (From admission, onward)   Start     Dose/Rate Route Frequency Ordered Stop   05/04/17 1800  clindamycin (CLEOCIN) IVPB 600 mg     600 mg 100 mL/hr over 30 Minutes Intravenous Every 6 hours 05/04/17 1607 05/05/17 0530   05/04/17 1030  clindamycin (CLEOCIN) IVPB 900 mg     900 mg 100 mL/hr over 30 Minutes Intravenous To ShortStay Surgical 05/03/17 1240 05/04/17 1318       Patient was given sequential compression devices, early ambulation, and chemoprophylaxis to prevent DVT.  Patient benefited maximally from hospital stay and there were no complications.    Recent vital signs:  Patient Vitals for the past 24 hrs:  BP Temp Temp src Pulse Resp SpO2  05/07/17 0524 132/71 98.8 F (37.1 C) Oral (!) 121 20 94 %  05/06/17 2032 102/66 98 F (36.7 C) Oral (!) 103 20 93 %  05/06/17 1700 (!) 131/49 - Oral 75 20 93 %  05/06/17 1331 109/88 98.4 F (36.9 C) Oral (!) 118 16 93 %  05/06/17 0800 124/64 - - - - -     Recent laboratory studies:  Recent Labs    05/05/17 0701 05/06/17 0428  WBC 5.5 7.4  HGB 10.1* 9.9*  HCT 31.7* 31.5*  PLT 117* 111*  NA 136  --   K 4.0  --   CL 100*  --   CO2 24  --   BUN 15  --   CREATININE 0.80  --   GLUCOSE 145*  --   INR 1.18 1.31  CALCIUM 7.8*  --      Discharge Medications:   Allergies as of 05/07/2017      Reactions   Amiodarone Hcl Swelling   SWELLING REACTION UNSPECIFIED    Penicillins Rash   Has  patient had a PCN reaction causing immediate rash, facial/tongue/throat swelling, SOB or lightheadedness with hypotension: No Has patient had a PCN reaction causing severe rash involving mucus membranes or skin necrosis: No Has patient had a PCN reaction that required hospitalization:Patient was inpatient when reaction occurred Has patient had a PCN reaction occurring within the last 10 years: No If all of the above answers are "NO", then may proceed with Cephalosporin use.   Statins Rash      Medication List    STOP taking these medications   enoxaparin 80 MG/0.8ML injection Commonly known as:  LOVENOX     TAKE these medications   acetaminophen 500 MG tablet Commonly known as:  TYLENOL Take 500 mg by mouth at bedtime as needed (for pain.).   albuterol 108 (90 Base) MCG/ACT inhaler Commonly known as:  PROVENTIL HFA;VENTOLIN HFA Inhale 2 puffs into the lungs every 4 (four) hours as needed for wheezing or shortness of breath.   alendronate 70 MG tablet Commonly known as:  FOSAMAX Take 1 tablet (70 mg total) by mouth every 7 (seven) days. Take with a full glass of water on an empty stomach. What changed:    when to take this  additional instructions   CARTIA XT 180 MG 24 hr capsule Generic drug:  diltiazem TAKE 1 CAPSULE (180 MG TOTAL) BY MOUTH DAILY.   diphenhydrAMINE 25 mg capsule Commonly known as:  BENADRYL Take 50 mg by mouth every 6 (six) hours as needed for itching.   fluticasone 50 MCG/ACT nasal spray Commonly known as:  FLONASE Place 1 spray into both nostrils daily as needed for allergies.   furosemide 40 MG tablet Commonly known as:  LASIX TAKE 1/2 TABLET BY MOUTH DAILY, MAY INCREASE TO 1 TABLET BY MOUTH DAILY IF NEEDED What changed:  See the new instructions.   HYDROcodone-acetaminophen 5-325 MG tablet Commonly known as:  NORCO/VICODIN Take 1-2 tablets by mouth every 4 (four) hours as needed for moderate pain. What changed:    how much to take  when  to take this   levothyroxine 25 MCG tablet Commonly known as:  SYNTHROID, LEVOTHROID TAKE 1 TABLET EVERY DAY BEFORE BREAKFAST   loratadine 10 MG tablet Commonly known as:  CLARITIN Take 10 mg by mouth daily as needed for allergies.   LUBRICANT EYE DROPS 0.4-0.3 % Soln Generic drug:  Polyethyl Glycol-Propyl Glycol Place 1-2 drops into both eyes 3 (three) times daily as needed (for dry eyes.).   promethazine 12.5 MG tablet Commonly known as:  PHENERGAN Take 1 tablet (12.5 mg total) by mouth every 6 (six) hours as needed for nausea or vomiting.   vitamin B-12 1000 MCG tablet Commonly known as:  CYANOCOBALAMIN Take 1,000 mcg by mouth daily.   Vitamin D3 2000 units Tabs Take 4,000 Units by mouth daily.   warfarin 5 MG tablet Commonly known as:  COUMADIN Take as directed. If you are unsure how to take this medication, talk to your nurse or doctor. Original instructions:  TAKE AS DIRECTED BY ANTI COAGULATION CLINIC What changed:    how much to take  how to take this  when to take this  additional instructions       Diagnostic Studies: Dg Pelvis Portable  Result Date: 05/04/2017 CLINICAL DATA:  Left total hip replacement. EXAM: PORTABLE PELVIS 1-2 VIEWS COMPARISON:  Intraoperative radiographs from the same day. FINDINGS: A single low AP view pelvis demonstrates left total hip arthroplasty. No complicating features are present. The hip is symmetric. IMPRESSION: Left total hip arthroplasty without radiographic evidence for complication. Electronically Signed   By: San Morelle M.D.   On: 05/04/2017 15:06   Dg C-arm 1-60 Min  Result Date: 05/04/2017 CLINICAL DATA:  Status post anterior approach left total hip joint prosthesis placement. EXAM: OPERATIVE left HIP (WITH PELVIS IF PERFORMED) 3 VIEWS. TECHNIQUE: Fluoroscopic spot image(s) were submitted for interpretation post-operatively. COMPARISON:  None in PACs FINDINGS: Fluoro time reported is 27 seconds. The patient has  undergone left total hip joint prosthesis placement. Radiographic positioning of the prosthetic components is good. The interface with the native bone appears normal. IMPRESSION: There is no immediate postprocedure complication following left total hip joint prosthesis placement. Electronically Signed   By: David  Martinique M.D.   On: 05/04/2017 14:23   Dg Hip Operative Unilat W Or W/o Pelvis Left  Result Date: 05/04/2017 CLINICAL DATA:  Status post anterior approach left total hip joint prosthesis placement. EXAM: OPERATIVE left HIP (WITH PELVIS IF PERFORMED) 3 VIEWS. TECHNIQUE: Fluoroscopic spot image(s) were submitted for interpretation post-operatively. COMPARISON:  None in PACs FINDINGS: Fluoro time reported is 27 seconds. The patient has undergone left total hip joint prosthesis placement. Radiographic positioning of the prosthetic components is good. The interface with the native bone appears normal. IMPRESSION: There is no immediate postprocedure complication following left total hip joint prosthesis placement. Electronically Signed   By: David  Martinique M.D.   On: 05/04/2017 14:23    Disposition: to skilled nursing facility   Follow-up Information    Mcarthur Rossetti, MD Follow up in 2 week(s).   Specialty:  Orthopedic Surgery Contact information: [place] [address] [phone]            Signed: Mcarthur Rossetti 05/07/2017, 6:44 AM

## 2017-05-07 NOTE — Progress Notes (Signed)
[name] to be D/C'd Skilled nursing facility per MD order.  Discussed prescriptions and follow up appointments with the patient. Prescriptions given to patient, medication list explained in detail. Pt verbalized understanding.  Allergies as of 05/07/2017      Reactions   Amiodarone Hcl Swelling   SWELLING REACTION UNSPECIFIED    Penicillins Rash   Has patient had a PCN reaction causing immediate rash, facial/tongue/throat swelling, SOB or lightheadedness with hypotension: No Has patient had a PCN reaction causing severe rash involving mucus membranes or skin necrosis: No Has patient had a PCN reaction that required hospitalization:Patient was inpatient when reaction occurred Has patient had a PCN reaction occurring within the last 10 years: No If all of the above answers are "NO", then may proceed with Cephalosporin use.   Statins Rash      Medication List    STOP taking these medications   enoxaparin 80 MG/0.8ML injection Commonly known as:  LOVENOX     TAKE these medications   acetaminophen 500 MG tablet Commonly known as:  TYLENOL Take 500 mg by mouth at bedtime as needed (for pain.).   albuterol 108 (90 Base) MCG/ACT inhaler Commonly known as:  PROVENTIL HFA;VENTOLIN HFA Inhale 2 puffs into the lungs every 4 (four) hours as needed for wheezing or shortness of breath.   alendronate 70 MG tablet Commonly known as:  FOSAMAX Take 1 tablet (70 mg total) by mouth every 7 (seven) days. Take with a full glass of water on an empty stomach. What changed:    when to take this  additional instructions   CARTIA XT 180 MG 24 hr capsule Generic drug:  diltiazem TAKE 1 CAPSULE (180 MG TOTAL) BY MOUTH DAILY.   diphenhydrAMINE 25 mg capsule Commonly known as:  BENADRYL Take 50 mg by mouth every 6 (six) hours as needed for itching.   fluticasone 50 MCG/ACT nasal spray Commonly known as:  FLONASE Place 1 spray into both nostrils daily as needed for allergies.   furosemide 40  MG tablet Commonly known as:  LASIX TAKE 1/2 TABLET BY MOUTH DAILY, MAY INCREASE TO 1 TABLET BY MOUTH DAILY IF NEEDED What changed:  See the new instructions.   HYDROcodone-acetaminophen 5-325 MG tablet Commonly known as:  NORCO/VICODIN Take 1-2 tablets by mouth every 4 (four) hours as needed for moderate pain. What changed:    how much to take  when to take this   levothyroxine 25 MCG tablet Commonly known as:  SYNTHROID, LEVOTHROID TAKE 1 TABLET EVERY DAY BEFORE BREAKFAST   loratadine 10 MG tablet Commonly known as:  CLARITIN Take 10 mg by mouth daily as needed for allergies.   LUBRICANT EYE DROPS 0.4-0.3 % Soln Generic drug:  Polyethyl Glycol-Propyl Glycol Place 1-2 drops into both eyes 3 (three) times daily as needed (for dry eyes.).   promethazine 12.5 MG tablet Commonly known as:  PHENERGAN Take 1 tablet (12.5 mg total) by mouth every 6 (six) hours as needed for nausea or vomiting.   vitamin B-12 1000 MCG tablet Commonly known as:  CYANOCOBALAMIN Take 1,000 mcg by mouth daily.   Vitamin D3 2000 units Tabs Take 4,000 Units by mouth daily.   warfarin 5 MG tablet Commonly known as:  COUMADIN Take as directed. If you are unsure how to take this medication, talk to your nurse or doctor. Original instructions:  TAKE AS DIRECTED BY ANTI COAGULATION CLINIC What changed:    how much to take  how to take this  when  to take this  additional instructions       Vitals:   05/06/17 2032 05/07/17 0524  BP: 102/66 132/71  Pulse: (!) 103 (!) 121  Resp: 20 20  Temp: 98 F (36.7 C) 98.8 F (37.1 C)  SpO2: 93% 94%    Skin clean, dry and intact without evidence of skin break down, no evidence of skin tears noted. IV catheter discontinued intact. Site without signs and symptoms of complications. Dressing and pressure applied, clean, dry, and intact. Pt denies pain at this time. No complaints noted.  An After Visit Summary and prescriptions were printed and given to  [facility]. Patient escorted via bed, and D/C to SNF via [facility].  Taylor RN

## 2017-05-07 NOTE — Progress Notes (Signed)
Patient ID: [name], female   DOB: 1933/01/18, 84 y.o.   MRN: [id number] Looks and feels better overall today.  Left hip stable.  Can discharge to skilled nursing today.

## 2017-05-07 NOTE — Social Work (Signed)
Clinical Social Worker facilitated patient discharge including contacting patient family and facility to confirm patient discharge plans.  Clinical information faxed to facility and family agreeable with plan.    CSW arranged ambulance transport via [facility] to [facility].    RN to call [phone] to give report prior to discharge.  Clinical Social Worker will sign off for now as social work intervention is no longer needed. Please consult us again if new need arises.  Neveen Daponte, LCSW Clinical Social Worker [phone]      

## 2017-05-07 NOTE — Progress Notes (Signed)
ANTICOAGULATION CONSULT NOTE - Follow Up Consult  Pharmacy Consult for Coumadin Indication: atrial fibrillation  Allergies  Allergen Reactions  . Amiodarone Hcl Swelling    SWELLING REACTION UNSPECIFIED   . Penicillins Rash    Has patient had a PCN reaction causing immediate rash, facial/tongue/throat swelling, SOB or lightheadedness with hypotension: No Has patient had a PCN reaction causing severe rash involving mucus membranes or skin necrosis: No Has patient had a PCN reaction that required hospitalization:Patient was inpatient when reaction occurred Has patient had a PCN reaction occurring within the last 10 years: No If all of the above answers are "NO", then may proceed with Cephalosporin use.   . Statins Rash    Patient Measurements: Height: 5\' 5"  (165.1 cm) Weight: 184 lb 3.2 oz (83.6 kg) IBW/kg (Calculated) : 57  Vital Signs: Temp: 98.8 F (37.1 C) (02/15 0524) Temp Source: Oral (02/15 0524) BP: 132/71 (02/15 0524) Pulse Rate: 121 (02/15 0524)  Labs: Recent Labs    05/05/17 0701 05/06/17 0428 05/07/17 0735  HGB 10.1* 9.9* 9.8*  HCT 31.7* 31.5* 31.5*  PLT 117* 111* 117*  LABPROT 14.9 16.2* 18.8*  INR 1.18 1.31 1.59  CREATININE 0.80  --  0.74    Estimated Creatinine Clearance: 57.9 mL/min (by C-G formula based on SCr of 0.74 mg/dL).  Assessment: 84 year old female on Coumadin prior to admission for atrial fibrillation who was admitted 2/12 for left total hip arthroplasty. Patient's last dose of Coumadin prior to admission was on 2/5 and she was bridged with Lovenox for L-THA 2/12. Her home regimen was Coumadin 5mg  daily except 2.5mg  on Mondays, Wednesdays, and Fridays.   Coumadin was resumed 2/12 post-operatively. INR today is up to 1.59. Hgb is stable. Platelets are low but stable. No bleeding is noted.   Goal of Therapy:  INR 2-3 Monitor platelets by anticoagulation protocol: Yes   Plan:  Coumadin 5 mg po x 1 tonight  (note patient received elevated  doses on 2/12 and 2/13 and tonight). Hope can revert back to home regimen soon. Daily PT/INR.  Sloan Leiter, PharmD, BCPS, BCCCP Clinical Pharmacist Clinical phone 05/07/2017 until 3:[phone] After hours, please call #[phone] 05/07/2017,11:34 AM

## 2017-05-07 NOTE — Care Management Important Message (Signed)
Important Message  Patient Details  Name: [name] MRN: [id number] Date of Birth: [birth year]   Medicare Important Message Given:  Yes    Orbie Pyo 05/07/2017, 12:26 PM

## 2017-05-07 NOTE — Progress Notes (Signed)
RN called and gave report to RN of [facility] at [employer] [employer]

## 2017-05-10 DIAGNOSIS — R2689 Other abnormalities of gait and mobility: Secondary | ICD-10-CM | POA: Diagnosis not present

## 2017-05-10 DIAGNOSIS — Z96642 Presence of left artificial hip joint: Secondary | ICD-10-CM | POA: Diagnosis not present

## 2017-05-10 DIAGNOSIS — I4891 Unspecified atrial fibrillation: Secondary | ICD-10-CM | POA: Diagnosis not present

## 2017-05-10 DIAGNOSIS — M79605 Pain in left leg: Secondary | ICD-10-CM | POA: Diagnosis not present

## 2017-05-10 DIAGNOSIS — I509 Heart failure, unspecified: Secondary | ICD-10-CM | POA: Diagnosis not present

## 2017-05-10 DIAGNOSIS — E039 Hypothyroidism, unspecified: Secondary | ICD-10-CM | POA: Diagnosis not present

## 2017-05-12 DIAGNOSIS — Z96642 Presence of left artificial hip joint: Secondary | ICD-10-CM | POA: Diagnosis not present

## 2017-05-12 DIAGNOSIS — I4891 Unspecified atrial fibrillation: Secondary | ICD-10-CM | POA: Diagnosis not present

## 2017-05-14 DIAGNOSIS — M6281 Muscle weakness (generalized): Secondary | ICD-10-CM | POA: Diagnosis not present

## 2017-05-14 DIAGNOSIS — Z471 Aftercare following joint replacement surgery: Secondary | ICD-10-CM | POA: Diagnosis not present

## 2017-05-14 DIAGNOSIS — Z96642 Presence of left artificial hip joint: Secondary | ICD-10-CM | POA: Diagnosis not present

## 2017-05-14 DIAGNOSIS — R2689 Other abnormalities of gait and mobility: Secondary | ICD-10-CM | POA: Diagnosis not present

## 2017-05-17 DIAGNOSIS — Z96642 Presence of left artificial hip joint: Secondary | ICD-10-CM | POA: Diagnosis not present

## 2017-05-17 DIAGNOSIS — M6281 Muscle weakness (generalized): Secondary | ICD-10-CM | POA: Diagnosis not present

## 2017-05-17 DIAGNOSIS — Z471 Aftercare following joint replacement surgery: Secondary | ICD-10-CM | POA: Diagnosis not present

## 2017-05-17 DIAGNOSIS — R2689 Other abnormalities of gait and mobility: Secondary | ICD-10-CM | POA: Diagnosis not present

## 2017-05-18 ENCOUNTER — Encounter (INDEPENDENT_AMBULATORY_CARE_PROVIDER_SITE_OTHER): Payer: Self-pay | Admitting: Orthopaedic Surgery

## 2017-05-18 ENCOUNTER — Ambulatory Visit (INDEPENDENT_AMBULATORY_CARE_PROVIDER_SITE_OTHER): Payer: Medicare Other | Admitting: Orthopaedic Surgery

## 2017-05-18 DIAGNOSIS — Z96642 Presence of left artificial hip joint: Secondary | ICD-10-CM

## 2017-05-18 MED ORDER — METHOCARBAMOL 750 MG PO TABS: 750.0000 mg | ORAL_TABLET | Freq: Four times a day (QID) | ORAL | 0 refills | Status: DC | PRN

## 2017-05-18 NOTE — Progress Notes (Signed)
HPI: [name] returns today 2 weeks status post left total hip arthroplasty.  She has had some pain in her groin area after twisting her leg trying to alleviate some of the pressure in the hip over the weekend.  But overall is trending towards improvement.  She is on chronic Coumadin.  She is asking for a muscle relaxant.  Ambulating with a walker.  Physical exam: Left calf supple nontender.  Dorsiflexion plantar flexion ankle intact.  Surgical staples well approximated wound no signs of infection.  Fluid range of motion hip.  Impression: Status post left total hip arthroplasty  Plan: We will send in Robaxin to help with muscle spasms.  Staples removed Steri-Strips applied.  Surgical incision tissue massage is encouraged.  See her back in 1 month sooner if there is any questions or concerns.

## 2017-05-20 DIAGNOSIS — Z471 Aftercare following joint replacement surgery: Secondary | ICD-10-CM | POA: Diagnosis not present

## 2017-05-20 DIAGNOSIS — R2689 Other abnormalities of gait and mobility: Secondary | ICD-10-CM | POA: Diagnosis not present

## 2017-05-20 DIAGNOSIS — M6281 Muscle weakness (generalized): Secondary | ICD-10-CM | POA: Diagnosis not present

## 2017-05-20 DIAGNOSIS — Z96642 Presence of left artificial hip joint: Secondary | ICD-10-CM | POA: Diagnosis not present

## 2017-05-21 DIAGNOSIS — R2689 Other abnormalities of gait and mobility: Secondary | ICD-10-CM | POA: Diagnosis not present

## 2017-05-21 DIAGNOSIS — Z4789 Encounter for other orthopedic aftercare: Secondary | ICD-10-CM | POA: Diagnosis not present

## 2017-05-21 DIAGNOSIS — M6281 Muscle weakness (generalized): Secondary | ICD-10-CM | POA: Diagnosis not present

## 2017-05-24 ENCOUNTER — Encounter: Payer: Self-pay | Admitting: Internal Medicine

## 2017-05-24 ENCOUNTER — Ambulatory Visit (INDEPENDENT_AMBULATORY_CARE_PROVIDER_SITE_OTHER): Payer: Medicare Other | Admitting: Internal Medicine

## 2017-05-24 VITALS — BP 94/64 | HR 97 | Ht 65.0 in | Wt 179.0 lb

## 2017-05-24 DIAGNOSIS — R2689 Other abnormalities of gait and mobility: Secondary | ICD-10-CM | POA: Diagnosis not present

## 2017-05-24 DIAGNOSIS — I509 Heart failure, unspecified: Secondary | ICD-10-CM

## 2017-05-24 DIAGNOSIS — I482 Chronic atrial fibrillation: Secondary | ICD-10-CM | POA: Diagnosis not present

## 2017-05-24 DIAGNOSIS — M6281 Muscle weakness (generalized): Secondary | ICD-10-CM | POA: Diagnosis not present

## 2017-05-24 DIAGNOSIS — I4821 Permanent atrial fibrillation: Secondary | ICD-10-CM

## 2017-05-24 DIAGNOSIS — Z4789 Encounter for other orthopedic aftercare: Secondary | ICD-10-CM | POA: Diagnosis not present

## 2017-05-24 MED ORDER — LEVOTHYROXINE SODIUM 25 MCG PO TABS: ORAL_TABLET | ORAL | 1 refills | Status: DC

## 2017-05-24 NOTE — Patient Instructions (Addendum)
Medication Instructions:  Your physician recommends that you continue on your current medications as directed. Please refer to the Current Medication list given to you today.  Labwork: CBC  Testing/Procedures: None ordered.  Follow-Up: Your physician recommends that you schedule a follow-up appointment in:  One Year with Dr Caryl Comes   Any Other Special Instructions Will Be Listed Below (If Applicable).     If you need a refill on your cardiac medications before your next appointment, please call your pharmacy.

## 2017-05-24 NOTE — Progress Notes (Signed)
Electrophysiology Office Note   Date:  05/24/2017   ID:  [name], DOB 12/01/1932, MRN [id number]  PCP:  Abner Greenspan, MD  Cardiologist:   Primary Electrophysiologist:  Virl Axe, MD    No chief complaint on file.    History of Present Illness: [name] is a 84 y.o. female seen in followup electrophysiology evaluation for atrial fibrillation.    She has hx of  atrial flutter ablation related to prior atriotomy undertaken at [facility] in March 3903 complicated by recurrent atrial arrhythmias prompting initiation of Tikosyn  This was stopped fall 2012 Recurrent symptoms prompted [name] to reinitiate Tikosyn.  Shes had recurrent episodes of atrial fibrillation associated with a rapid ventricular response.   She underwent cardioversion and reverted again to atrial fibrillation; it is now permanent She has decided to remain on warfarin  DATE TEST EF   10/14 Echo   55-60 %   2/17 Echo   50-55 %   9/18 Echo  55-65%    2/19 she underwent hip replacement surgery   Her BP post op was very low it is gradually getting better.  She also had problems with edema prior to her surgery which resolved with diuretics.  Diuretics have been held postoperatively.  On her own recognition they were resumed.   Date Cr Hgb  6/18   13.9  2/19 0.74 9.8    Past Medical History:  Diagnosis Date  . Allergic rhinitis   . 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 Arthritis   . Atrial fibrillation -persistent cardiologist-  dr Antoria Lanza/  primary EP -- [facility] Tawanna Sat ([facility])   a. s/p PVI [name] 2010;  b. on tikosyn/coumadin;  c. 05/2009 Echo: EF 60-65%, Gr 2 DD. (first dx 09/ 2007)  . Bilateral lower extremity edema   . Bleeding hemorrhoid   . Carotid stenosis    mild (hosp 3/11)- consult by vasc/ Dr Donnetta Hutching  . Complication of anesthesia    hard to wake  . Depression    denies  . Diverticulosis of colon   . Dyspnea    on exertion-climbing stairs  . Dysrhythmia   . Fatty liver   . H/O cardiac radiofrequency  ablation    01/ 2008 at [facility] /  03/ 2010  at [facility]  . Heart failure with preserved ejection fraction ([place])   . Heart murmur    "prior to valve repair"  . History of adenomatous polyp of colon    tubular adenoma's  . History of cardiomyopathy    secondary tachycardia-induced cardiomyopathy -- resolved 2014  . History of squamous cell carcinoma in situ (SCCIS) of skin    05/ 2017  nasal bridge and right medial knee  . History of transient ischemic attack (TIA)    01-24-2005 and 06-12-2009  . Hyperlipidemia   . Hypothyroidism   . Mild intermittent asthma    reacts to cats  . Mixed stress and urge urinary incontinence   . Pulmonary nodule   . S/P mitral valve repair 10-23-1998  dr Boyce Medici at [facility]   for MVP and regurg. (annuloplasty ring procedure)  . Swelling of left extremity 2017   states it's gotten worse  . Varicose vein of leg   . Wears glasses    Past Surgical History:  Procedure Laterality Date  . APPENDECTOMY  1978  . CARDIAC ELECTROPHYSIOLOGY [place] AND ABLATION  01/ 2008    at [place]   right-sided ablation atrial flutter  .  CARDIAC ELECTROPHYSIOLOGY STUDY AND ABLATION  03/ 2010   dr Jaymes Graff at [facility]   AV node ablation and pulmonary vein isolation for atrial fib  . CARDIOVERSION  06-18-2006;  07-13-2006;  10-19-2010;  10-27-2010  . COLONOSCOPY    . CYSTO/ TRANSURETHRAL COLLAGEN INJECTION THERAPY  07-26-2007   dr Matilde Sprang  . DILATION AND CURETTAGE OF UTERUS    . EXCISIONAL HEMORRHOIDECTOMY  1980s  . HEMORRHOID SURGERY N/A 10/29/2016   Procedure: HEMORRHOIDECTOMY;  Surgeon: Leighton Ruff, MD;  Location: [facility];  Service: General;  Laterality: N/A;  . MITRAL VALVE ANNULOPLASTY  10/23/1998   "Model 4625; [employer] [id number]"; size 67mm; [facility]; Dr. Boyce Medici  . PILONIDAL CYST EXCISION  1954  . TEE WITH CARDIOVERSION  05-06-2006 at [facility];  01-02-2013 at [facility]  . TOTAL HIP ARTHROPLASTY Left 05/04/2017    Procedure: LEFT TOTAL HIP ARTHROPLASTY ANTERIOR APPROACH;  Surgeon: Mcarthur Rossetti, MD;  Location: [facility];  Service: Orthopedics;  Laterality: Left;  . TRANSTHORACIC ECHOCARDIOGRAM  05-01-2015   dr Caryl Comes   ef 50-55%/  mild AV sclerosis without stenosis/  post MV repair with mild central MR (valve area by pressure half-time 2cm^2,  valve area by continutity equation 0.91cm^2, peak grandiant 16mmHg)/  severe LAE/ mild TR/ mild RAE   . TUBAL LIGATION Bilateral 1978     Current Outpatient Medications  Medication Sig Dispense Refill  . acetaminophen (TYLENOL) 500 MG tablet Take 500 mg by mouth at bedtime as needed (for pain.).    [name] albuterol (PROVENTIL HFA;VENTOLIN HFA) 108 (90 Base) MCG/ACT inhaler Inhale 2 puffs into the lungs every 4 (four) hours as needed for wheezing or shortness of breath. 1 Inhaler 0  . alendronate (FOSAMAX) 70 MG tablet Take 1 tablet (70 mg total) by mouth every 7 (seven) days. Take with a full glass of water on an empty stomach. (Patient taking differently: Take 70 mg by mouth every Sunday. Take with a full glass of water on an empty stomach.) 4 tablet 11  . CARTIA XT 180 MG 24 hr capsule TAKE 1 CAPSULE (180 MG TOTAL) BY MOUTH DAILY. 90 capsule 2  . Cholecalciferol (VITAMIN D3) 2000 units TABS Take 4,000 Units by mouth daily.    . diphenhydrAMINE (BENADRYL) 25 mg capsule Take 50 mg by mouth every 6 (six) hours as needed for itching.    . fluticasone (FLONASE) 50 MCG/ACT nasal spray Place 1 spray into both nostrils daily as needed for allergies.    . furosemide (LASIX) 40 MG tablet TAKE 1/2 TABLET BY MOUTH DAILY, MAY INCREASE TO 1 TABLET BY MOUTH DAILY IF NEEDED (Patient taking differently: TAKE 0.5 TABLET (20 MG) BY MOUTH DAILY IN THE MORNING.) 90 tablet 2  . HYDROcodone-acetaminophen (NORCO/VICODIN) 5-325 MG tablet Take 1-2 tablets by mouth every 4 (four) hours as needed for moderate pain. 30 tablet 0  . levothyroxine (SYNTHROID, LEVOTHROID) 25 MCG tablet TAKE 1 TABLET  EVERY DAY BEFORE BREAKFAST 90 tablet 1  . loratadine (CLARITIN) 10 MG tablet Take 10 mg by mouth daily as needed for allergies.     . methocarbamol (ROBAXIN) 750 MG tablet Take 1 tablet (750 mg total) by mouth every 6 (six) hours as needed for muscle spasms. 60 tablet 0  . Polyethyl Glycol-Propyl Glycol (LUBRICANT EYE DROPS) 0.4-0.3 % SOLN Place 1-2 drops into both eyes 3 (three) times daily as needed (for dry eyes.).    [name] promethazine (PHENERGAN) 12.5 MG tablet Take 1 tablet (12.5 mg total) by mouth every 6 (  six) hours as needed for nausea or vomiting. 10 tablet 0  . vitamin B-12 (CYANOCOBALAMIN) 1000 MCG tablet Take 1,000 mcg by mouth daily.    [name] warfarin (COUMADIN) 5 MG tablet TAKE AS DIRECTED BY ANTI COAGULATION CLINIC (Patient taking differently: Take 2.5-5 mg by mouth See admin instructions. TAKE 0.5 TABLET (2.5 MG) ON Monday, Wednesday, AND Friday. TAKE 1 TABLET (5 MG) ON ALL OTHER DAYS) 12 tablet 0   No current facility-administered medications for this visit.     Allergies:   Amiodarone hcl; Penicillins; and Statins   Social History:  The patient  reports that  has never smoked. she has never used smokeless tobacco. She reports that she drinks alcohol. She reports that she does not use drugs.   Family History:  The patient's family history includes Alcohol abuse in her father; Cancer in her father; Hypertension in her mother; Lung cancer in her father.    ROS:  Please see the history of present illness.  .   All other systems are reviewed and negative.    PHYSICAL EXAM: VS:  BP 94/64   Pulse 97   Ht 5\' 5"  (1.651 m)   Wt 179 lb (81.2 kg)   SpO2 97%   BMI 29.79 kg/m  , BMI Body mass index is 29.79 kg/m. Well developed and nourished in no acute distress HENT normal Neck supple with JVP-flat Clear Irregularly irregular rate and rhythm, no murmurs or gallops Abd-soft with active BS without hepatomegaly No Clubbing cyanosis tr edema R 1+ post op leg Skin-warm and dry A &  Oriented  Grossly normal sensory and motor function   EKG: Atrial fibrillation at 59 -year-old-/0 9/34   Recent Labs: 08/26/2016: ALT 14; Pro B Natriuretic peptide (BNP) 98.0 11/20/2016: TSH 3.23 05/07/2017: BUN 12; Creatinine, Ser 0.74; Hemoglobin 9.8; Platelets 117; Potassium 4.1; Sodium 139    Lipid Panel     Component Value Date/Time   CHOL 246 (H) 08/26/2016 1622   TRIG 95.0 08/26/2016 1622   HDL 49.70 08/26/2016 1622   CHOLHDL 5 08/26/2016 1622   VLDL 19.0 08/26/2016 1622   LDLCALC 178 (H) 08/26/2016 1622   LDLDIRECT 203.8 01/09/2008 0934     Wt Readings from Last 3 Encounters:  05/24/17 179 lb (81.2 kg)  05/04/17 184 lb 3.2 oz (83.6 kg)  04/28/17 184 lb 3.2 oz (83.6 kg)      Other studies Reviewed: Additional studies/ records that were reviewed today include: labs as noted  Review of the above records today demonstrates:    ASSESSMENT AND PLAN: Atrial fibrillation-permanent  Hypothyroidism-treated  TSH normal a few weeks ago  Dypsnea on exertion   Postoperative hip replacement  Anemia   Hypotension relative There is a little bit of edema.  However, given her low blood pressure  We will hold off on adjusting her medications.  I do wonder whether she may have been, anemic following hip surgery whether this is contributing   Will check CBC   We spent more than 50% of our >25 min visit in face to face counseling regarding the above   Current medicines are reviewed at length with the patient today.   The patient does not have concerns regarding her medicines.  The following changes were made today:  none  Labs/ tests ordered today include:    Orders Placed This Encounter  Procedures  . EKG 12-Lead     Disposition:   *51months   Signed, Virl Axe, MD  05/24/2017 3:50 PM  [address] [name] [address] [phone] (office) [phone] (fax)

## 2017-05-25 LAB — CBC WITH DIFFERENTIAL/PLATELET
BASOS ABS: 0.1 10*3/uL (ref 0.0–0.2)
Basos: 1 %
EOS (ABSOLUTE): 0.1 10*3/uL (ref 0.0–0.4)
Eos: 2 %
Hematocrit: 33.2 % — ABNORMAL LOW (ref 34.0–46.6)
Hemoglobin: 10.4 g/dL — ABNORMAL LOW (ref 11.1–15.9)
IMMATURE GRANS (ABS): 0 10*3/uL (ref 0.0–0.1)
Immature Granulocytes: 0 %
LYMPHS: 21 %
Lymphocytes Absolute: 1.5 10*3/uL (ref 0.7–3.1)
MCH: 26.7 pg (ref 26.6–33.0)
MCHC: 31.3 g/dL — ABNORMAL LOW (ref 31.5–35.7)
MCV: 85 fL (ref 79–97)
MONOS ABS: 0.8 10*3/uL (ref 0.1–0.9)
Monocytes: 10 %
NEUTROS PCT: 66 %
Neutrophils Absolute: 4.9 10*3/uL (ref 1.4–7.0)
PLATELETS: 408 10*3/uL — AB (ref 150–379)
RBC: 3.9 x10E6/uL (ref 3.77–5.28)
RDW: 15.6 % — AB (ref 12.3–15.4)
WBC: 7.4 10*3/uL (ref 3.4–10.8)

## 2017-05-26 ENCOUNTER — Telehealth: Payer: Self-pay | Admitting: Internal Medicine

## 2017-05-26 DIAGNOSIS — M6281 Muscle weakness (generalized): Secondary | ICD-10-CM | POA: Diagnosis not present

## 2017-05-26 DIAGNOSIS — R2689 Other abnormalities of gait and mobility: Secondary | ICD-10-CM | POA: Diagnosis not present

## 2017-05-26 DIAGNOSIS — Z4789 Encounter for other orthopedic aftercare: Secondary | ICD-10-CM | POA: Diagnosis not present

## 2017-05-26 NOTE — Telephone Encounter (Signed)
Spoke with pt regarding her leg cramps. She stated her cramping was on the same side as her recent hip replacement. She asked about taking supplemental potassium. I told her that her potassium was normal when we drew labs on her 2 weeks ago, so I could not be certain her cramping was from hypokalemia. She asked me about taking Robaxin and I suggested she call her surgeons office to get advice on that and her recent cramping. I told her there could be concern for a DVT since she only recently had her hip surgery. She verbalized understanding and had no further questions.

## 2017-05-26 NOTE — Telephone Encounter (Signed)
New message    Patient calling, concerns about leg cramps. Patient wants to add a supplement. Please call

## 2017-05-28 DIAGNOSIS — Z4789 Encounter for other orthopedic aftercare: Secondary | ICD-10-CM | POA: Diagnosis not present

## 2017-05-28 DIAGNOSIS — M6281 Muscle weakness (generalized): Secondary | ICD-10-CM | POA: Diagnosis not present

## 2017-05-28 DIAGNOSIS — R2689 Other abnormalities of gait and mobility: Secondary | ICD-10-CM | POA: Diagnosis not present

## 2017-05-31 DIAGNOSIS — R2689 Other abnormalities of gait and mobility: Secondary | ICD-10-CM | POA: Diagnosis not present

## 2017-05-31 DIAGNOSIS — M6281 Muscle weakness (generalized): Secondary | ICD-10-CM | POA: Diagnosis not present

## 2017-05-31 DIAGNOSIS — Z4789 Encounter for other orthopedic aftercare: Secondary | ICD-10-CM | POA: Diagnosis not present

## 2017-06-01 ENCOUNTER — Telehealth: Payer: Self-pay

## 2017-06-01 NOTE — Telephone Encounter (Signed)
Spoke with patient and set up recheck on INR with coumadin clinic on 06/03/17 at 9:15am.

## 2017-06-01 NOTE — Telephone Encounter (Signed)
Copied from [place]. Topic: Appointment Scheduling - Scheduling Inquiry for Clinic >> Jun 01, 2017 12:48 PM Ahmed Prima L wrote: Reason for CRM: pt is requesting to have her blood checked in the coumadin clinic. Please call patient to sch [phone]

## 2017-06-02 DIAGNOSIS — M6281 Muscle weakness (generalized): Secondary | ICD-10-CM | POA: Diagnosis not present

## 2017-06-02 DIAGNOSIS — Z4789 Encounter for other orthopedic aftercare: Secondary | ICD-10-CM | POA: Diagnosis not present

## 2017-06-02 DIAGNOSIS — R2689 Other abnormalities of gait and mobility: Secondary | ICD-10-CM | POA: Diagnosis not present

## 2017-06-03 ENCOUNTER — Ambulatory Visit (INDEPENDENT_AMBULATORY_CARE_PROVIDER_SITE_OTHER): Payer: Medicare Other | Admitting: General Practice

## 2017-06-03 DIAGNOSIS — I4891 Unspecified atrial fibrillation: Secondary | ICD-10-CM | POA: Diagnosis not present

## 2017-06-03 DIAGNOSIS — Z7901 Long term (current) use of anticoagulants: Secondary | ICD-10-CM

## 2017-06-03 LAB — POCT INR: INR: 2.5

## 2017-06-03 NOTE — Patient Instructions (Addendum)
Pre visit review using our clinic review tool, if applicable. No additional management support is needed unless otherwise documented below in the visit note.  Continue to take 1 pill (5mg ) daily EXCEPT for 1/2 pill (2.5mg ) on Mondays, Wednesdays and Fridays.   Re-check in 4 weeks.

## 2017-06-15 ENCOUNTER — Other Ambulatory Visit: Payer: Self-pay | Admitting: Internal Medicine

## 2017-06-15 ENCOUNTER — Ambulatory Visit (INDEPENDENT_AMBULATORY_CARE_PROVIDER_SITE_OTHER): Payer: Medicare Other

## 2017-06-15 ENCOUNTER — Encounter (INDEPENDENT_AMBULATORY_CARE_PROVIDER_SITE_OTHER): Payer: Self-pay | Admitting: Orthopaedic Surgery

## 2017-06-15 ENCOUNTER — Ambulatory Visit (INDEPENDENT_AMBULATORY_CARE_PROVIDER_SITE_OTHER): Payer: Medicare Other | Admitting: Orthopaedic Surgery

## 2017-06-15 DIAGNOSIS — Z96642 Presence of left artificial hip joint: Secondary | ICD-10-CM

## 2017-06-15 DIAGNOSIS — M25561 Pain in right knee: Secondary | ICD-10-CM

## 2017-06-15 DIAGNOSIS — M25562 Pain in left knee: Secondary | ICD-10-CM

## 2017-06-15 DIAGNOSIS — G8929 Other chronic pain: Secondary | ICD-10-CM | POA: Diagnosis not present

## 2017-06-15 MED ORDER — DILTIAZEM HCL ER COATED BEADS 180 MG PO CP24: 180.0000 mg | ORAL_CAPSULE | Freq: Every day | ORAL | 3 refills | Status: DC

## 2017-06-15 NOTE — Telephone Encounter (Signed)
Pt's medication was sent to pt's pharmacy as requested. Confirmation received.  °

## 2017-06-15 NOTE — Progress Notes (Signed)
Office Visit Note   Patient: [name]           Date of Birth: 21-Nov-1932           MRN: [id number] Visit Date: 06/15/2017              Requested by: Tower, Wynelle Fanny, MD [place], [place] [address] PCP: Abner Greenspan, MD   Assessment & Plan: Visit Diagnoses:  1. Chronic pain of both knees   2. Status post total replacement of left hip     Plan: I do feel that her knees will do better with time as she gets over hip replacement surgery.  This gave her reassurance that does not look from radiographic standpoint that she needs knee replacements at all.  She will work on quad strengthening exercises and increase her activities as comfort allows.  She can get back into her water aerobics as well.  I do not need to see her back now for 6 months.  At that visit I would like a low AP pelvis and lateral of her left operative hip.  Follow-Up Instructions: Return in about 6 months (around 12/16/2017).   Orders:  Orders Placed This Encounter  Procedures  . XR Knee 1-2 Views Left  . XR Knee 1-2 Views Right   No orders of the defined types were placed in this encounter.     Procedures: No procedures performed   Clinical Data: No additional findings.   Subjective: Chief Complaint  Patient presents with  . Left Hip - Routine Post Op  Although the patient is 6 weeks status post a left total knee arthroplasty and then postoperative follow-up for this we are seeing her for her knees today mainly.  This is a new issue for her.  She is a very active 84 years old but she is been having some twinges of pain in both her knees and she wanted these evaluated today with x-rays and exam.  She says her left hip is doing wonderful and each day goes by she gets better and better.  She is walking without assistive device and happy to get back into water aerobics.  HPI  Review of Systems She currently denies any fever, chills, nausea, vomiting.  Objective: Vital Signs: There  were no vitals taken for this visit.  Physical Exam She is alert and oriented x3 and in no acute distress Ortho Exam Examination of her left hip shows fluid range of motion of the hip with no issues.  Examination of both knees show fluid and full range of motion of both knees.  Both knees have slight patellofemoral crepitation and just some mild pain in general but no instability on ligamentous exam with full range of motion and no effusions. Specialty Comments:  No specialty comments available.  Imaging: Xr Knee 1-2 Views Left  Result Date: 06/15/2017 2 views of the left knee show well-maintained joint space with no acute findings or malalignment.    PMFS History: Patient Active Problem List   Diagnosis Date Noted  . Unilateral primary osteoarthritis, left hip 05/04/2017  . Status post total replacement of left hip 05/04/2017  . Hip osteoarthritis 04/27/2017  . Long term (current) use of anticoagulants 03/04/2017  . Venous stasis dermatitis of both lower extremities 01/08/2017  . Impacted cerumen of right ear 11/20/2016  . Osteopenia 10/25/2016  . Pedal edema 08/26/2016  . Varicose veins of both lower extremities 08/26/2016  . Estrogen deficiency 08/26/2016  .  Screening mammogram, encounter for 08/26/2016  . Hemorrhoids 08/26/2016  . Epistaxis 01/07/2016  . Urticaria 08/22/2014  . Hip pain 08/02/2014  . Left knee pain 08/02/2014  . Chronic cough 05/08/2014  . Caregiver stress 08/16/2013  . Colon cancer screening 08/16/2013  . Encounter for therapeutic drug monitoring 04/20/2013  . Left ovarian cyst 03/14/2013  . (HFpEF) heart failure with preserved ejection fraction ([place]) 12/27/2012  . Cardiomyopathy, secondary --Resolved again 10/14 10/13/2010  . COLONIC POLYPS, ADENOMATOUS, HX OF 09/18/2009  . PULMONARY NODULE 12/20/2008  . GANGLION CYST 10/04/2007  . Hyperlipidemia 04/27/2007  . DEPRESSION 04/27/2007  . Asthma, mild intermittent 04/27/2007  . INSOMNIA 04/27/2007    . ADENOMATOUS COLONIC POLYP 11/04/2006  . Hypothyroidism 09/02/2006  . Atrial fibrillation ([place]) 08/05/2006   Past Medical History:  Diagnosis Date  . Allergic rhinitis   . 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 Arthritis   . Atrial fibrillation -persistent cardiologist-  dr klein/  primary EP -- dr Tawanna Sat ([facility])   a. s/p PVI [name] 2010;  b. on tikosyn/coumadin;  c. 05/2009 Echo: EF 60-65%, Gr 2 DD. (first dx 09/ 2007)  . Bilateral lower extremity edema   . Bleeding hemorrhoid   . Carotid stenosis    mild (hosp 3/11)- consult by vasc/ Dr Donnetta Hutching  . Complication of anesthesia    hard to wake  . Depression    denies  . Diverticulosis of colon   . Dyspnea    on exertion-climbing stairs  . Dysrhythmia   . Fatty liver   . H/O cardiac radiofrequency ablation    01/ 2008 at [place] [place] /  03/ 2010  at [facility]  . Heart failure with preserved ejection fraction ([place])   . Heart murmur    "prior to valve repair"  . History of adenomatous polyp of colon    tubular adenoma's  . History of cardiomyopathy    secondary tachycardia-induced cardiomyopathy -- resolved 2014  . History of squamous cell carcinoma in situ (SCCIS) of skin    05/ 2017  nasal bridge and right medial knee  . History of transient ischemic attack (TIA)    01-24-2005 and 06-12-2009  . Hyperlipidemia   . Hypothyroidism   . Mild intermittent asthma    reacts to cats  . Mixed stress and urge urinary incontinence   . Pulmonary nodule   . S/P mitral valve repair 10-23-1998  dr Boyce Medici at [facility]   for MVP and regurg. (annuloplasty ring procedure)  . Swelling of left extremity 2017   states it's gotten worse  . Varicose vein of leg   . Wears glasses     Family History  Problem Relation Age of Onset  . Hypertension Mother   . Lung cancer Father        smoker  . Alcohol abuse Father   . Cancer Father        bladder and lung CA smoker  . Breast cancer Neg Hx     Past Surgical History:  Procedure  Laterality Date  . APPENDECTOMY  1978  . CARDIAC ELECTROPHYSIOLOGY [place] AND ABLATION  01/ 2008    at [place]   right-sided ablation atrial flutter  . CARDIAC ELECTROPHYSIOLOGY STUDY AND ABLATION  03/ 2010   dr Jaymes Graff at [facility]   AV node ablation and pulmonary vein isolation for atrial fib  . CARDIOVERSION  06-18-2006;  07-13-2006;  10-19-2010;  10-27-2010  . COLONOSCOPY    . CYSTO/ TRANSURETHRAL COLLAGEN INJECTION  THERAPY  07-26-2007   dr Matilde Sprang  . DILATION AND CURETTAGE OF UTERUS    . EXCISIONAL HEMORRHOIDECTOMY  1980s  . HEMORRHOID SURGERY N/A 10/29/2016   Procedure: HEMORRHOIDECTOMY;  Surgeon: Leighton Ruff, MD;  Location: [facility];  Service: General;  Laterality: N/A;  . MITRAL VALVE ANNULOPLASTY  10/23/1998   "Model 4625; [employer] [id number]"; size 53mm; [facility]; Dr. Boyce Medici  . PILONIDAL CYST EXCISION  1954  . TEE WITH CARDIOVERSION  05-06-2006 at [facility]  01-02-2013 at [facility]  . TOTAL HIP ARTHROPLASTY Left 05/04/2017   Procedure: LEFT TOTAL HIP ARTHROPLASTY ANTERIOR APPROACH;  Surgeon: Mcarthur Rossetti, MD;  Location: [facility];  Service: Orthopedics;  Laterality: Left;  . TRANSTHORACIC ECHOCARDIOGRAM  05-01-2015   dr Caryl Comes   ef 50-55%/  mild AV sclerosis without stenosis/  post MV repair with mild central MR (valve area by pressure half-time 2cm^2,  valve area by continutity equation 0.91cm^2, peak grandiant 65mmHg)/  severe LAE/ mild TR/ mild RAE   . TUBAL LIGATION Bilateral 1978   Social History   Occupational History  . Occupation: Architectural technologist: RETIRED  Tobacco Use  . Smoking status: Never Smoker  . Smokeless tobacco: Never Used  Substance and Sexual Activity  . Alcohol use: Yes    Alcohol/week: 0.0 oz    Comment: seldom  . Drug use: No  . Sexual activity: Never

## 2017-06-21 ENCOUNTER — Ambulatory Visit: Payer: Self-pay | Admitting: *Deleted

## 2017-06-21 ENCOUNTER — Encounter: Payer: Self-pay | Admitting: Internal Medicine

## 2017-06-21 ENCOUNTER — Ambulatory Visit (INDEPENDENT_AMBULATORY_CARE_PROVIDER_SITE_OTHER): Payer: Medicare Other | Admitting: Internal Medicine

## 2017-06-21 VITALS — BP 112/76 | HR 91 | Temp 97.9°F | Wt 178.0 lb

## 2017-06-21 DIAGNOSIS — R3915 Urgency of urination: Secondary | ICD-10-CM

## 2017-06-21 DIAGNOSIS — R300 Dysuria: Secondary | ICD-10-CM | POA: Diagnosis not present

## 2017-06-21 DIAGNOSIS — N3000 Acute cystitis without hematuria: Secondary | ICD-10-CM

## 2017-06-21 DIAGNOSIS — R350 Frequency of micturition: Secondary | ICD-10-CM

## 2017-06-21 LAB — POC URINALSYSI DIPSTICK (AUTOMATED)
Bilirubin, UA: NEGATIVE
Glucose, UA: NEGATIVE
KETONES UA: NEGATIVE
Nitrite, UA: POSITIVE
SPEC GRAV UA: 1.025 (ref 1.010–1.025)
Urobilinogen, UA: 0.2 E.U./dL
pH, UA: 5 (ref 5.0–8.0)

## 2017-06-21 MED ORDER — CEPHALEXIN 250 MG PO CAPS: 250.0000 mg | ORAL_CAPSULE | Freq: Two times a day (BID) | ORAL | 0 refills | Status: DC

## 2017-06-21 NOTE — Telephone Encounter (Signed)
Pt called stating that she has symptoms of a UTI. With frequency, irritation and just feeling uncomfortable. Denies seeing any blood in urine, just murky the first time voiding and no fever. This started on Friday and she drank vinegar water to flush her kidneys. She took clindamycin that she had. She stated that this helped, until Sunday evening and it started again and she took another clindamycin and used an ice pack over night, which seem to help. She wanted to know if she could just give a urine and get something called in. I told her that I would check. Notified [place] and spoke to [place]. Pt has to come in to be assessed.  Pt voiced understanding.. Per protocol, she has an appointment today with R. Garnette Gunner, NP in the afternoon,  per pt request.  Advised to drink lots of water, she stated she would and then hung up before I could give her more home care advice.   Reason for Disposition . Urinating more frequently than usual (i.e., frequency)  Answer Assessment - Initial Assessment Questions 1. SYMPTOM: "What's the main symptom you're concerned about?" (e.g., frequency, incontinence)     Irritation and frequency 2. ONSET: "When did the  ________  start?"     Friday 3. PAIN: "Is there any pain?" If so, ask: "How bad is it?" (Scale: 1-10; mild, moderate, severe)     uncomfortable 4. CAUSE: "What do you think is causing the symptoms?"     UTI 5. OTHER SYMPTOMS: "Do you have any other symptoms?" (e.g., fever, flank pain, blood in urine, pain with urination)     Urine looked a little murky the first time 6. PREGNANCY: "Is there any chance you are pregnant?" "When was your last menstrual period?"     no  Protocols used: URINARY [facility]

## 2017-06-21 NOTE — Telephone Encounter (Signed)
Pt has appt with R Baity NP 06/21/17 at 3:15.

## 2017-06-21 NOTE — Patient Instructions (Signed)

## 2017-06-21 NOTE — Progress Notes (Signed)
HPI  Pt presents to the clinic today with c/o urinary urgency, frequency and dysuria. She reports this started 3 days ago. She denies fever, chills, nausea or low back pain. She denies vaginal complaints. She has tried leftover Clindamycin with minimal relief.    Review of Systems  Past Medical History:  Diagnosis Date  . Allergic rhinitis   . 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 Arthritis   . Atrial fibrillation -persistent cardiologist-  dr klein/  primary EP -- [facility] Tawanna Sat ([facility])   a. s/p PVI [name] 2010;  b. on tikosyn/coumadin;  c. 05/2009 Echo: EF 60-65%, Gr 2 DD. (first dx 09/ 2007)  . Bilateral lower extremity edema   . Bleeding hemorrhoid   . Carotid stenosis    mild (hosp 3/11)- consult by vasc/ Dr Donnetta Hutching  . Complication of anesthesia    hard to wake  . Depression    denies  . Diverticulosis of colon   . Dyspnea    on exertion-climbing stairs  . Dysrhythmia   . Fatty liver   . H/O cardiac radiofrequency ablation    01/ 2008 at [facility] [place] /  03/ 2010  at [facility]  . Heart failure with preserved ejection fraction ([place])   . Heart murmur    "prior to valve repair"  . History of adenomatous polyp of colon    tubular adenoma's  . History of cardiomyopathy    secondary tachycardia-induced cardiomyopathy -- resolved 2014  . History of squamous cell carcinoma in situ (SCCIS) of skin    05/ 2017  nasal bridge and right medial knee  . History of transient ischemic attack (TIA)    01-24-2005 and 06-12-2009  . Hyperlipidemia   . Hypothyroidism   . Mild intermittent asthma    reacts to cats  . Mixed stress and urge urinary incontinence   . Pulmonary nodule   . S/P mitral valve repair 10-23-1998  dr Boyce Medici at [facility]   for MVP and regurg. (annuloplasty ring procedure)  . Swelling of left extremity 2017   states it's gotten worse  . Varicose vein of leg   . Wears glasses     Family History  Problem Relation Age of Onset  . Hypertension Mother   .  Lung cancer Father        smoker  . Alcohol abuse Father   . Cancer Father        bladder and lung CA smoker  . Breast cancer Neg Hx     Social History   Socioeconomic History  . Marital status: Widowed    Spouse name: Not on file  . Number of children: 6  . Years of education: Not on file  . Highest education level: Not on file  Occupational History  . Occupation: Architectural technologist: RETIRED  Social Needs  . Financial resource strain: Not on file  . Food insecurity:    Worry: Not on file    Inability: Not on file  . Transportation needs:    Medical: Not on file    Non-medical: Not on file  Tobacco Use  . Smoking status: Never Smoker  . Smokeless tobacco: Never Used  Substance and Sexual Activity  . Alcohol use: Yes    Alcohol/week: 0.0 oz    Comment: seldom  . Drug use: No  . Sexual activity: Never  Lifestyle  . Physical activity:    Days per week: Not on file    Minutes per session: Not  on file  . Stress: Not on file  Relationships  . Social connections:    Talks on phone: Not on file    Gets together: Not on file    Attends religious service: Not on file    Active member of club or organization: Not on file    Attends meetings of clubs or organizations: Not on file    Relationship status: Not on file  . Intimate partner violence:    Fear of current or ex partner: Not on file    Emotionally abused: Not on file    Physically abused: Not on file    Forced sexual activity: Not on file  Other Topics Concern  . Not on file  Social History Narrative   Retired. Daily Caffeine use: 2 daily     Allergies  Allergen Reactions  . Amiodarone Hcl Swelling    SWELLING REACTION UNSPECIFIED   . Penicillins Rash    Has patient had a PCN reaction causing immediate rash, facial/tongue/throat swelling, SOB or lightheadedness with hypotension: No Has patient had a PCN reaction causing severe rash involving mucus membranes or skin necrosis: No Has patient had a PCN  reaction that required hospitalization:Patient was inpatient when reaction occurred Has patient had a PCN reaction occurring within the last 10 years: No If all of the above answers are "NO", then may proceed with Cephalosporin use.   . Statins Rash     Constitutional: Denies fever, malaise, fatigue, headache or abrupt weight changes.   GU: Pt reports urgency, frequency and pain with urination. Denies burning sensation, blood in urine, odor or discharge.   No other specific complaints in a complete review of systems (except as listed in HPI above).    Objective:   Physical Exam  BP 112/76   Pulse 91   Temp 97.9 F (36.6 C) (Oral)   Wt 178 lb (80.7 kg)   SpO2 98%   BMI 29.62 kg/m  Wt Readings from Last 3 Encounters:  06/21/17 178 lb (80.7 kg)  05/24/17 179 lb (81.2 kg)  05/04/17 184 lb 3.2 oz (83.6 kg)    General: Appears her stated age, well developed, well nourished in NAD. Abdomen: Soft and nontender. Normal bowel sounds. No distention or masses noted.  No CVA tenderness.        Assessment & Plan:   Urgency, Frequency, Dysuria secondary to UTI:  Urinalysis:2+ leuks, positive nitrites, 3+ blood Will send urine culture eRx sent if for Keflex 250 mg BID x 5 days OK to take AZO OTC Drink plenty of fluids  RTC as needed or if symptoms persist. Webb Silversmith, NP

## 2017-06-21 NOTE — Addendum Note (Signed)
Addended by: Lurlean Nanny on: 06/21/2017 05:13 PM   Modules accepted: Orders

## 2017-06-23 LAB — URINE CULTURE
MICRO NUMBER:: 90400544
SPECIMEN QUALITY:: ADEQUATE

## 2017-07-01 ENCOUNTER — Ambulatory Visit (INDEPENDENT_AMBULATORY_CARE_PROVIDER_SITE_OTHER): Payer: Medicare Other | Admitting: General Practice

## 2017-07-01 DIAGNOSIS — Z7901 Long term (current) use of anticoagulants: Secondary | ICD-10-CM

## 2017-07-01 DIAGNOSIS — I4891 Unspecified atrial fibrillation: Secondary | ICD-10-CM | POA: Diagnosis not present

## 2017-07-01 LAB — POCT INR: INR: 2.2

## 2017-07-01 NOTE — Patient Instructions (Addendum)
Pre visit review using our clinic review tool, if applicable. No additional management support is needed unless otherwise documented below in the visit note.  Continue to take 1 pill (5mg ) daily EXCEPT for 1/2 pill (2.5mg ) on Mondays, Wednesdays and Fridays.   Re-check in 4 weeks.

## 2017-07-02 ENCOUNTER — Emergency Department
Admission: EM | Admit: 2017-07-02 | Discharge: 2017-07-02 | Disposition: A | Discharge status: Home / Self Care | Payer: Medicare Other | Attending: Emergency Medicine | Admitting: Emergency Medicine

## 2017-07-02 ENCOUNTER — Encounter: Payer: Self-pay | Admitting: Emergency Medicine

## 2017-07-02 ENCOUNTER — Emergency Department: Payer: Medicare Other

## 2017-07-02 ENCOUNTER — Other Ambulatory Visit: Payer: Self-pay

## 2017-07-02 DIAGNOSIS — R200 Anesthesia of skin: Secondary | ICD-10-CM | POA: Diagnosis present

## 2017-07-02 DIAGNOSIS — R29818 Other symptoms and signs involving the nervous system: Secondary | ICD-10-CM | POA: Diagnosis not present

## 2017-07-02 DIAGNOSIS — Z954 Presence of other heart-valve replacement: Secondary | ICD-10-CM | POA: Diagnosis not present

## 2017-07-02 DIAGNOSIS — I509 Heart failure, unspecified: Secondary | ICD-10-CM | POA: Diagnosis not present

## 2017-07-02 DIAGNOSIS — R202 Paresthesia of skin: Secondary | ICD-10-CM | POA: Insufficient documentation

## 2017-07-02 DIAGNOSIS — E039 Hypothyroidism, unspecified: Secondary | ICD-10-CM | POA: Diagnosis not present

## 2017-07-02 DIAGNOSIS — Z85828 Personal history of other malignant neoplasm of skin: Secondary | ICD-10-CM | POA: Insufficient documentation

## 2017-07-02 DIAGNOSIS — Z79899 Other long term (current) drug therapy: Secondary | ICD-10-CM | POA: Insufficient documentation

## 2017-07-02 DIAGNOSIS — Z96642 Presence of left artificial hip joint: Secondary | ICD-10-CM | POA: Diagnosis not present

## 2017-07-02 DIAGNOSIS — Z7901 Long term (current) use of anticoagulants: Secondary | ICD-10-CM | POA: Diagnosis not present

## 2017-07-02 LAB — CBC
HCT: 36.4 % (ref 35.0–47.0)
Hemoglobin: 12.3 g/dL (ref 12.0–16.0)
MCH: 27.5 pg (ref 26.0–34.0)
MCHC: 33.7 g/dL (ref 32.0–36.0)
MCV: 81.7 fL (ref 80.0–100.0)
PLATELETS: 176 10*3/uL (ref 150–440)
RBC: 4.46 MIL/uL (ref 3.80–5.20)
RDW: 15.6 % — ABNORMAL HIGH (ref 11.5–14.5)
WBC: 5.2 10*3/uL (ref 3.6–11.0)

## 2017-07-02 LAB — SEDIMENTATION RATE: SED RATE: 59 mm/h — AB (ref 0–30)

## 2017-07-02 LAB — BASIC METABOLIC PANEL
ANION GAP: 7 (ref 5–15)
BUN: 20 mg/dL (ref 6–20)
CALCIUM: 9.2 mg/dL (ref 8.9–10.3)
CO2: 29 mmol/L (ref 22–32)
CREATININE: 0.75 mg/dL (ref 0.44–1.00)
Chloride: 104 mmol/L (ref 101–111)
Glucose, Bld: 96 mg/dL (ref 65–99)
Potassium: 3.6 mmol/L (ref 3.5–5.1)
SODIUM: 140 mmol/L (ref 135–145)

## 2017-07-02 NOTE — ED Notes (Signed)
Pt back from MRI 

## 2017-07-02 NOTE — ED Notes (Signed)
ED Provider at bedside. 

## 2017-07-02 NOTE — Discharge Instructions (Addendum)
please return for any new or different symptoms. Please call one of the neurologist listed and arrange follow-up. Also follow-up with your regular doctor. Continue regular medicines.

## 2017-07-02 NOTE — ED Triage Notes (Signed)
Patient presents to the ED with facial numbness/tingling to her left side of her face and bilaterally to her jaw.  Patient states symptoms began last week when she began taking medication for a bladder infection.  Patient states, "I thought it was the medication so I stopped taking it but the tingling/numbness hasn't stopped.  Patient reports similar symptoms when "I had that irregular heart beat before."

## 2017-07-02 NOTE — ED Provider Notes (Signed)
[facility] Emergency Department Provider Note   ____________________________________________   First MD Initiated Contact with Patient 07/02/17 1523     (approximate)  I have reviewed the triage vital signs and the nursing notes.   HISTORY  Chief Complaint Numbness (facial)    HPI [name] is a 84 y.o. female Patient reports she's had a numb prickly feeling a little bit of coldness basically she says in the area of the beard would grow on once face. From the corner of the left side down around the cheek of the chin and up to the other side just below the eye. She denies any toothache nasal stuffiness rash weakness and dysarthria or any other complaints. She reports she noticed this a week ago and it seemed to get worse today.  Past Medical History:  Diagnosis Date  . Allergic rhinitis   . 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 Arthritis   . Atrial fibrillation -persistent cardiologist-  dr klein/  primary [facility] -- [facility]aw[facility]   a. s/p PVI [name] 2010;  b. on tikosyn/coumadin;  c. 05/2009 Echo: EF 60-65%, Gr 2 DD. (first dx 09/ 2007)  . Bilateral lower extremity edema   . Bleeding hemorrhoid   . Carotid stenosis    mild (hosp 3/11)- consult by vasc/ Dr Donnetta Hutching  . Complication of anesthesia    hard to wake  . Depression    denies  . Diverticulosis of colon   . Dyspnea    on exertion-climbing stairs  . Dysrhythmia   . Fatty liver   . H/O cardiac radiofrequency ablation    01/ 2008 at [place] [facility] [place] /  03/ 2010  at [facility]  . Heart failure with preserved ejection fraction ([place])   . Heart murmur    "prior to valve repair"  . History of adenomatous polyp of colon    tubular adenoma's  . History of cardiomyopathy    secondary tachycardia-induced cardiomyopathy -- resolved 2014  . History of squamous cell carcinoma in situ (SCCIS) of skin    05/ 2017  nasal bridge and right medial knee  . History of transient ischemic attack (TIA)    01-24-2005 and 06-12-2009  . Hyperlipidemia   . Hypothyroidism   . Mild intermittent asthma    reacts to cats  . Mixed stress and urge urinary incontinence   . Pulmonary nodule   . S/P mitral valve repair 10-23-1998  dr Boyce Medici at [facility]   for MVP and regurg. (annuloplasty ring procedure)  . Swelling of left extremity 2017   states it's gotten worse  . Varicose vein of leg   . Wears glasses     Patient Active Problem List   Diagnosis Date Noted  . Unilateral primary osteoarthritis, left hip 05/04/2017  . Status post total replacement of left hip 05/04/2017  . Hip osteoarthritis 04/27/2017  . Long term (current) use of anticoagulants 03/04/2017  . Venous stasis dermatitis of both lower extremities 01/08/2017  . Impacted cerumen of right ear 11/20/2016  . Osteopenia 10/25/2016  . Pedal edema 08/26/2016  . Varicose veins of both lower extremities 08/26/2016  . Estrogen deficiency 08/26/2016  . Screening mammogram, encounter for 08/26/2016  . Hemorrhoids 08/26/2016  . Epistaxis 01/07/2016  . Urticaria 08/22/2014  . Hip pain 08/02/2014  . Left knee pain 08/02/2014  . Chronic cough 05/08/2014  . Caregiver stress 08/16/2013  . Colon cancer screening 08/16/2013  . Encounter for therapeutic drug monitoring 04/20/2013  .  Left ovarian cyst 03/14/2013  . (HFpEF) heart failure with preserved ejection fraction ([place]) 12/27/2012  . Cardiomyopathy, secondary --Resolved again 10/14 10/13/2010  . COLONIC POLYPS, ADENOMATOUS, HX OF 09/18/2009  . PULMONARY NODULE 12/20/2008  . GANGLION CYST 10/04/2007  . Hyperlipidemia 04/27/2007  . DEPRESSION 04/27/2007  . Asthma, mild intermittent 04/27/2007  . INSOMNIA 04/27/2007  . ADENOMATOUS COLONIC POLYP 11/04/2006  . Hypothyroidism 09/02/2006  . Atrial fibrillation ([place]) 08/05/2006    Past Surgical History:  Procedure Laterality Date  . APPENDECTOMY  1978  . CARDIAC ELECTROPHYSIOLOGY [place] AND ABLATION  01/ 2008    at [place]   right-sided ablation atrial flutter  . CARDIAC ELECTROPHYSIOLOGY STUDY AND ABLATION  03/ 2010   dr Jaymes Graff at [facility]   AV node ablation and pulmonary vein isolation for atrial fib  . CARDIOVERSION  06-18-2006;  07-13-2006;  10-19-2010;  10-27-2010  . COLONOSCOPY    . CYSTO/ TRANSURETHRAL COLLAGEN INJECTION THERAPY  07-26-2007   dr Matilde Sprang  . DILATION AND CURETTAGE OF UTERUS    . EXCISIONAL HEMORRHOIDECTOMY  1980s  . HEMORRHOID SURGERY N/A 10/29/2016   Procedure: HEMORRHOIDECTOMY;  Surgeon: Leighton Ruff, MD;  Location: [facility];  Service: General;  Laterality: N/A;  . MITRAL VALVE ANNULOPLASTY  10/23/1998   "Model 4625; [employer] [id number]"; size 26mm; [facility]; Dr. Boyce Medici  . PILONIDAL CYST EXCISION  1954  . TEE WITH CARDIOVERSION  05-06-2006 at [facility];  01-02-2013 at [facility]  . TOTAL HIP ARTHROPLASTY Left 05/04/2017   Procedure: LEFT TOTAL HIP ARTHROPLASTY ANTERIOR APPROACH;  Surgeon: Mcarthur Rossetti, MD;  Location: [place];  Service: Orthopedics;  Laterality: Left;  . TRANSTHORACIC ECHOCARDIOGRAM  05-01-2015   dr Caryl Comes   ef 50-55%/  mild AV sclerosis without stenosis/  post MV repair with mild central MR (valve area by pressure half-time 2cm^2,  valve area by continutity equation 0.91cm^2, peak grandiant 68mmHg)/  severe LAE/ mild TR/ mild RAE   . TUBAL LIGATION Bilateral 1978    Prior to Admission medications   Medication Sig Start Date End Date Taking? Authorizing Provider  acetaminophen (TYLENOL) 500 MG tablet Take 500 mg by mouth at bedtime as needed (for pain.).    [provider]  albuterol (PROVENTIL HFA;VENTOLIN HFA) 108 (90 Base) MCG/ACT inhaler Inhale 2 puffs into the lungs every 4 (four) hours as needed for wheezing or shortness of breath. 06/23/16   Tower, Wynelle Fanny, MD  alendronate (FOSAMAX) 70 MG tablet Take 1 tablet (70 mg total) by mouth every 7 (seven) days. Take with a full glass of water on an empty stomach. Patient taking  differently: Take 70 mg by mouth every Sunday. Take with a full glass of water on an empty stomach. 11/20/16   Tower, Wynelle Fanny, MD  cephALEXin (KEFLEX) 250 MG capsule Take 1 capsule (250 mg total) by mouth 2 (two) times daily. 06/21/17   Jearld Fenton, NP  Cholecalciferol (VITAMIN D3) 2000 units TABS Take 4,000 Units by mouth daily.    [provider]  diltiazem (CARTIA XT) 180 MG 24 hr capsule Take 1 capsule (180 mg total) by mouth daily. 06/15/17   Deboraha Sprang, MD  diphenhydrAMINE (BENADRYL) 25 mg capsule Take 50 mg by mouth every 6 (six) hours as needed for itching.    [provider]  fluticasone (FLONASE) 50 MCG/ACT nasal spray Place 1 spray into both nostrils daily as needed for allergies.    [provider]  furosemide (LASIX) 40 MG tablet  TAKE 1/2 TABLET BY MOUTH DAILY, MAY INCREASE TO 1 TABLET BY MOUTH DAILY IF NEEDED Patient taking differently: TAKE 0.5 TABLET (20 MG) BY MOUTH DAILY IN THE MORNING. 08/19/16   Deboraha Sprang, MD  HYDROcodone-acetaminophen (NORCO/VICODIN) 5-325 MG tablet Take 1-2 tablets by mouth every 4 (four) hours as needed for moderate pain. 05/06/17   Mcarthur Rossetti, MD  levothyroxine (SYNTHROID, LEVOTHROID) 25 MCG tablet TAKE 1 TABLET EVERY DAY BEFORE BREAKFAST 05/24/17   Deboraha Sprang, MD  loratadine (CLARITIN) 10 MG tablet Take 10 mg by mouth daily as needed for allergies.     [provider]  methocarbamol (ROBAXIN) 750 MG tablet Take 1 tablet (750 mg total) by mouth every 6 (six) hours as needed for muscle spasms. 05/18/17   Pete Pelt, PA-C  Polyethyl Glycol-Propyl Glycol (LUBRICANT EYE DROPS) 0.4-0.3 % SOLN Place 1-2 drops into both eyes 3 (three) times daily as needed (for dry eyes.).    [provider]  promethazine (PHENERGAN) 12.5 MG tablet Take 1 tablet (12.5 mg total) by mouth every 6 (six) hours as needed for nausea or vomiting. 05/06/17   Mcarthur Rossetti, MD  vitamin B-12 (CYANOCOBALAMIN) 1000  MCG tablet Take 1,000 mcg by mouth daily.    [provider]  warfarin (COUMADIN) 5 MG tablet TAKE AS DIRECTED BY ANTI COAGULATION CLINIC Patient taking differently: Take 2.5-5 mg by mouth See admin instructions. TAKE 0.5 TABLET (2.5 MG) ON Monday, Wednesday, AND Friday. TAKE 1 TABLET (5 MG) ON ALL OTHER DAYS 02/03/17   Tower, Wynelle Fanny, MD    Allergies Amiodarone hcl; Penicillins; and Statins  Family History  Problem Relation Age of Onset  . Hypertension Mother   . Lung cancer Father        smoker  . Alcohol abuse Father   . Cancer Father        bladder and lung CA smoker  . Breast cancer Neg Hx     Social History Social History   Tobacco Use  . Smoking status: Never Smoker  . Smokeless tobacco: Never Used  Substance Use Topics  . Alcohol use: Yes    Alcohol/week: 0.0 oz    Comment: seldom  . Drug use: No    Review of Systems    Constitutional: No fever/chills Eyes: No visual changes. ENT: No sore throat. Cardiovascular: Denies chest pain. Respiratory: Denies shortness of breath. Gastrointestinal: No abdominal pain.  No nausea, no vomiting.  No diarrhea.  No constipation. Genitourinary: Negative for dysuria. Musculoskeletal: Negative for back pain. Skin: Negative for rash. Neurological: Negative for headaches, focal weakness  ____________________________________________   PHYSICAL EXAM:  VITAL SIGNS: ED Triage Vitals  Enc Vitals Group     BP 07/02/17 1335 130/63     Pulse Rate 07/02/17 1335 90     Resp 07/02/17 1335 18     Temp 07/02/17 1335 98.6 F (37 C)     Temp Source 07/02/17 1335 Oral     SpO2 07/02/17 1335 96 %     Weight 07/02/17 1335 175 lb (79.4 kg)     Height 07/02/17 1335 5\' 5"  (1.651 m)     Head Circumference --      Peak Flow --      Pain Score 07/02/17 1340 0     Pain Loc --      Pain Edu? --      Excl. in [place]? --     Constitutional: Alert and oriented. Well appearing and in no  acute distress. Eyes: Conjunctivae are normal.  PERRL. EOMI.fundi are very difficult to see what I can see looks normal Head: Atraumatic. Nose: No congestion/rhinnorhea. Mouth/Throat: Mucous membranes are moist.  Oropharynx non-erythematous. Neck: No stridor.  Cardiovascular: Normal rate, regular rhythm. Grossly normal heart sounds.  Good peripheral circulation. Respiratory: Normal respiratory effort.  No retractions. Lungs CTAB. Gastrointestinal: Soft and nontender. No distention. No abdominal bruits. No CVA tenderness. Musculoskeletal: No lower extremity tenderness nor edema.  No joint effusions. Neurologic:  Normal speech and language. No gross focal neurologic deficits are appreciated. specifically cranial nerves II through XII are intact on the visual fields were not checked cerebellar finger to nose and rapid alternating movements are normal motor strength is 5 over 5 throughout sensation is intact except for as noted in the face Skin:  Skin is warm, dry and intact. No rash noted. Psychiatric: Mood and affect are normal. Speech and behavior are normal.  ____________________________________________   LABS (all labs ordered are listed, but only abnormal results are displayed)  Labs Reviewed  CBC - Abnormal; Notable for the following components:      Result Value   RDW 15.6 (*)    All other components within normal limits  SEDIMENTATION RATE - Abnormal; Notable for the following components:   Sed Rate 59 (*)    All other components within normal limits  BASIC METABOLIC PANEL  noted patient is on Coumadin INR was 2.2 yesterday ____________________________________________  EKG  EKG read and interpreted by me shows atrial fibrillation at a rate of 100 with normal axis I PVC no acute ST-T wave changes ____________________________________________  RADIOLOGY  ED MD interpretation:    Official radiology report(s): Mr Brain Wo Contrast  Result Date: 07/02/2017 CLINICAL DATA:  Left-sided facial numbness and tingling beginning  last week. EXAM: MRI HEAD WITHOUT CONTRAST TECHNIQUE: Multiplanar, multiecho pulse sequences of the brain and surrounding structures were obtained without intravenous contrast. COMPARISON:  04/14/2012 FINDINGS: Brain: No acute infarct, mass, midline shift, or extra-axial fluid collection is identified. Scattered chronic microhemorrhages are again seen in both cerebral hemispheres as well as in the left cerebellum. Mild cerebral atrophy is unchanged. Periventricular and subcortical cerebral white matter T2 hyperintensities are stable to minimally increased and nonspecific but compatible with mild chronic small vessel ischemic disease. Vascular: Major intracranial vascular flow voids are preserved. Skull and upper cervical spine: Unremarkable bone marrow signal. Sinuses/Orbits: Unremarkable orbits. Paranasal sinuses and mastoid air cells are clear. Other: None. IMPRESSION: 1. No acute intracranial abnormality. 2. Mild chronic small vessel ischemic disease. Electronically Signed   By: Logan Bores M.D.   On: 07/02/2017 17:28   Dg Chest Portable 1 View  Result Date: 07/02/2017 CLINICAL DATA:  Stroke-like symptoms. History of heart failure and hypothyroidism. Previous mitral valve repair. EXAM: PORTABLE CHEST 1 VIEW COMPARISON:  04/09/2014 FINDINGS: Stable changes from a previous median sternotomy. Cardiac silhouette is top-normal in size. No mediastinal or hilar masses. No evidence of adenopathy. Prominent bronchovascular markings. No evidence of pneumonia or pulmonary edema. No pleural effusion or pneumothorax. Skeletal structures are grossly intact. IMPRESSION: No acute cardiopulmonary disease. Electronically Signed   By: Lajean Manes M.D.   On: 07/02/2017 16:20    ____________________________________________   PROCEDURES  Procedure(s) performed:   Procedures  Critical Care performed:   ____________________________________________   INITIAL IMPRESSION / ASSESSMENT AND PLAN / ED  COURSE       Clinical Course as of Jul 03 2026  Fri Jul 02, 2017  1522 MCHC: 33.7 [PM]  Clinical Course User Index [PM] Nena Polio, MD     ____________________________________________   FINAL CLINICAL IMPRESSION(S) / ED DIAGNOSES  Final diagnoses:  Paresthesia     ED Discharge Orders    None       Note:  This document was prepared using Dragon voice recognition software and may include unintentional dictation errors.    Nena Polio, MD 07/02/17 2028

## 2017-07-02 NOTE — ED Notes (Signed)
Pt on phone with MRI to answer screening questions 

## 2017-07-02 NOTE — ED Notes (Signed)
Pt reports "deep facial numbness" that is on both sides of her face on the lateral aspect of her eyes, down her cheeks and across her chin. Pt states that last time this happened, she was diagnosed with a-fib and kept in the hospital for 3 days. Pt is in A-fib at this time. Pt takes diltiazem for a-fib and has not skipped a dose. Pt denies pain, nausea or vomiting. Pt in NAD at this time, but wanted to make sure everything was ok. States there has been a lot going on in her life in the last several months, including a hip replacement.

## 2017-07-02 NOTE — ED Notes (Signed)
Per lab, they will add-on sedimentation rate to lab tubes already drawn earlier.

## 2017-07-02 NOTE — ED Notes (Signed)
Patient transported to MRI 

## 2017-07-14 DIAGNOSIS — R202 Paresthesia of skin: Secondary | ICD-10-CM | POA: Insufficient documentation

## 2017-07-14 DIAGNOSIS — R200 Anesthesia of skin: Secondary | ICD-10-CM | POA: Insufficient documentation

## 2017-07-29 ENCOUNTER — Ambulatory Visit (INDEPENDENT_AMBULATORY_CARE_PROVIDER_SITE_OTHER): Payer: Medicare Other | Admitting: General Practice

## 2017-07-29 DIAGNOSIS — Z7901 Long term (current) use of anticoagulants: Secondary | ICD-10-CM

## 2017-07-29 LAB — POCT INR: INR: 2.1

## 2017-07-29 NOTE — Patient Instructions (Addendum)
Pre visit review using our clinic review tool, if applicable. No additional management support is needed unless otherwise documented below in the visit note.  Continue to take 1 pill (5mg ) daily EXCEPT for 1/2 pill (2.5mg ) on Mondays, Wednesdays and Fridays.   Re-check in 6 weeks.

## 2017-08-02 ENCOUNTER — Ambulatory Visit (INDEPENDENT_AMBULATORY_CARE_PROVIDER_SITE_OTHER): Payer: Medicare Other | Admitting: Family Medicine

## 2017-08-02 ENCOUNTER — Encounter: Payer: Self-pay | Admitting: Family Medicine

## 2017-08-02 ENCOUNTER — Ambulatory Visit: Payer: Self-pay | Admitting: *Deleted

## 2017-08-02 VITALS — BP 114/62 | HR 111 | Temp 97.8°F | Ht 65.0 in | Wt 182.5 lb

## 2017-08-02 DIAGNOSIS — K5900 Constipation, unspecified: Secondary | ICD-10-CM | POA: Diagnosis not present

## 2017-08-02 DIAGNOSIS — R600 Localized edema: Secondary | ICD-10-CM

## 2017-08-02 LAB — CBC WITH DIFFERENTIAL/PLATELET
BASOS PCT: 0.9 % (ref 0.0–3.0)
Basophils Absolute: 0 10*3/uL (ref 0.0–0.1)
EOS PCT: 2.2 % (ref 0.0–5.0)
Eosinophils Absolute: 0.1 10*3/uL (ref 0.0–0.7)
HEMATOCRIT: 35.5 % — AB (ref 36.0–46.0)
HEMOGLOBIN: 11.7 g/dL — AB (ref 12.0–15.0)
Lymphocytes Relative: 28.2 % (ref 12.0–46.0)
Lymphs Abs: 1.5 10*3/uL (ref 0.7–4.0)
MCHC: 33.1 g/dL (ref 30.0–36.0)
MCV: 79.5 fl (ref 78.0–100.0)
MONO ABS: 0.5 10*3/uL (ref 0.1–1.0)
MONOS PCT: 9.4 % (ref 3.0–12.0)
Neutro Abs: 3.2 10*3/uL (ref 1.4–7.7)
Neutrophils Relative %: 59.3 % (ref 43.0–77.0)
Platelets: 168 10*3/uL (ref 150.0–400.0)
RBC: 4.46 Mil/uL (ref 3.87–5.11)
RDW: 15.7 % — ABNORMAL HIGH (ref 11.5–15.5)
WBC: 5.4 10*3/uL (ref 4.0–10.5)

## 2017-08-02 LAB — COMPREHENSIVE METABOLIC PANEL
ALBUMIN: 4.1 g/dL (ref 3.5–5.2)
ALK PHOS: 44 U/L (ref 39–117)
ALT: 10 U/L (ref 0–35)
AST: 17 U/L (ref 0–37)
BUN: 23 mg/dL (ref 6–23)
CALCIUM: 9.2 mg/dL (ref 8.4–10.5)
CHLORIDE: 102 meq/L (ref 96–112)
CO2: 31 mEq/L (ref 19–32)
Creatinine, Ser: 0.8 mg/dL (ref 0.40–1.20)
GFR: 72.85 mL/min (ref >60.00)
Glucose, Bld: 96 mg/dL (ref 70–99)
POTASSIUM: 4.2 meq/L (ref 3.5–5.1)
SODIUM: 140 meq/L (ref 135–145)
Total Bilirubin: 0.6 mg/dL (ref 0.2–1.2)
Total Protein: 7.3 g/dL (ref 6.0–8.3)

## 2017-08-02 LAB — TSH: TSH: 2.92 u[IU]/mL (ref 0.35–4.50)

## 2017-08-02 NOTE — Patient Instructions (Addendum)
Keep up good fluid intake (also fruit)   Take miralax (1 serving) three times daily   (store brand is fine)   Stay active  Elevate feet when you sit  Wear support hose when you can and get back to the vein clinic  Avoid excess sodium   Lab today   If constipation does not improve - I have a low threshold to get you back to GI

## 2017-08-02 NOTE — Telephone Encounter (Signed)
Patient has battled constipation after surgery- she got back to normal- patient all the sudden has has constipation. She has been trying to get her stool to move. Patient has finally gotten stool to mave- but she feels she has stool that is in upper GI that is still present. She is having abdominal bloating. Appointment today for assessment.  Reason for Disposition . Abdomen is more swollen than usual  Answer Assessment - Initial Assessment Questions 1. STOOL PATTERN OR FREQUENCY: "How often do you pass bowel movements (BMs)?"  (Normal range: tid to q 3 days)  "When was the last BM passed?"       Normally 2-3 times/day- usually after meals, last BM- today with use of laxative suppository- lower bowel hard stool  2. STRAINING: "Do you have to strain to have a BM?"      Not normally- just the urge to go 3. RECTAL PAIN: "Does your rectum hurt when the stool comes out?" If so, ask: "Do you have hemorrhoids? How bad is the pain?"  (Scale 1-10; or mild, moderate, severe)     Not normally- now patient feel pressure and has felt this for 3-4 weeks 4. STOOL COMPOSITION: "Are the stools hard?"      Now- hard stool 5. BLOOD ON STOOLS: "Has there been any blood on the toilet tissue or on the surface of the BM?" If so, ask: "When was the last time?"      Large BM- patient thinks she has bleeding from large stool 6. CHRONIC CONSTIPATION: "Is this a new problem for you?"  If no, ask: How long have you had this problem?" (days, weeks, months)      Patient had had problem after surgery- she had staightened out- patient is not sure what happened.  7. CHANGES IN DIET: "Have there been any recent changes in your diet?"      no 8. MEDICATIONS: "Have you been taking any new medications?"     no 9. LAXATIVES: "Have you been using any laxatives or enemas?"  If yes, ask "What, how often, and when was the last time?"     Fleet suppository- last night 4 am  10. CAUSE: "What do you think is causing the constipation?"        unknown 11. OTHER SYMPTOMS: "Do you have any other symptoms?" (e.g., abdominal pain, fever, vomiting)       Rectal pain, some cold/sweaty - due to pain, bloating, feet/ankle swelling  12. PREGNANCY: "Is there any chance you are pregnant?" "When was your last menstrual period?"       n/a  Protocols used: CONSTIPATION-A-AH

## 2017-08-02 NOTE — Assessment & Plan Note (Signed)
New since her hip surgery in feb  Unusual for her -dry hard stools despite fluid intake  Lab today Will try miralax tid for now to help hard stools  Low threshold for GI ref if needed

## 2017-08-02 NOTE — Assessment & Plan Note (Signed)
Suspect due to venous insuff  Lab today as well  F/u with vein clinic  Elevate feet supp hose as tol

## 2017-08-02 NOTE — Progress Notes (Signed)
Subjective:    Patient ID: [name], female    DOB: 1933/02/12, 84 y.o.   MRN: [id number]  HPI Here for symptoms of constipation and bloating  Also ankle swelling   Wt Readings from Last 3 Encounters:  08/02/17 182 lb 8 oz (82.8 kg)  07/02/17 175 lb (79.4 kg)  06/21/17 178 lb (80.7 kg)   30.37 kg/m    Bowel problems started after her hip surgery  Got off her pain killers Then suddenly she became much more constipated  Hard stools/ urgency to go and cannot pass the stools  Taking probiotics  Lot of bloating  Painful to pass/some hemorrhoids  (unusual for her )- usually has 3 bms per day after meals  She has used a fleet suppository    Goes to water exercise regularly  Diet has not changed at all  (trying to eat less carbs for the past 2 y) Fluid intake - usually drinks 3   32 oz water per day at least  2 cups of coffee per day  Drinks 2 glasses of whole milk per day - (for years) - but did cut back a bit on it  5000 iu vit D daily   Eats grapes and dates for snacks  Lots of apples  Walnuts and almonds worsen her bloating     Ankle swelling is coming back as well  Last cardiology- CHF is not an issue    She sees Dr Kellie Simmering at the vein clinic  Due to re schedule f/u -wants to have veins worked on in the fall    Lab Results  Component Value Date   CREATININE 0.75 07/02/2017   BUN 20 07/02/2017   NA 140 07/02/2017   K 3.6 07/02/2017   CL 104 07/02/2017   CO2 29 07/02/2017   Lab Results  Component Value Date   TSH 3.23 11/20/2016    Lab Results  Component Value Date   WBC 5.2 07/02/2017   HGB 12.3 07/02/2017   HCT 36.4 07/02/2017   MCV 81.7 07/02/2017   PLT 176 07/02/2017    Last colonoscopy 3/16 with 5 y recall Adenoma   Patient Active Problem List   Diagnosis Date Noted  . Constipation 08/02/2017  . Unilateral primary osteoarthritis, left hip 05/04/2017  . Status post total replacement of left hip 05/04/2017  . Hip osteoarthritis  04/27/2017  . Long term (current) use of anticoagulants 03/04/2017  . Venous stasis dermatitis of both lower extremities 01/08/2017  . Impacted cerumen of right ear 11/20/2016  . Osteopenia 10/25/2016  . Pedal edema 08/26/2016  . Varicose veins of both lower extremities 08/26/2016  . Estrogen deficiency 08/26/2016  . Screening mammogram, encounter for 08/26/2016  . Hemorrhoids 08/26/2016  . Epistaxis 01/07/2016  . Urticaria 08/22/2014  . Hip pain 08/02/2014  . Left knee pain 08/02/2014  . Chronic cough 05/08/2014  . Caregiver stress 08/16/2013  . Colon cancer screening 08/16/2013  . Encounter for therapeutic drug monitoring 04/20/2013  . Left ovarian cyst 03/14/2013  . (HFpEF) heart failure with preserved ejection fraction ([place]) 12/27/2012  . Cardiomyopathy, secondary --Resolved again 10/14 10/13/2010  . COLONIC POLYPS, ADENOMATOUS, HX OF 09/18/2009  . PULMONARY NODULE 12/20/2008  . GANGLION CYST 10/04/2007  . Hyperlipidemia 04/27/2007  . DEPRESSION 04/27/2007  . Asthma, mild intermittent 04/27/2007  . INSOMNIA 04/27/2007  . ADENOMATOUS COLONIC POLYP 11/04/2006  . Hypothyroidism 09/02/2006  . Atrial fibrillation ([place]) 08/05/2006   Past Medical History:  Diagnosis Date  . Allergic  rhinitis   . 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 Arthritis   . Atrial fibrillation -persistent cardiologist-  dr klein/  primary EP -- dr Tawanna Sat ([facility])   a. s/p PVI [name] 2010;  b. on tikosyn/coumadin;  c. 05/2009 Echo: EF 60-65%, Gr 2 DD. (first dx 09/ 2007)  . Bilateral lower extremity edema   . Bleeding hemorrhoid   . Carotid stenosis    mild (hosp 3/11)- consult by vasc/ Dr Donnetta Hutching  . Complication of anesthesia    hard to wake  . Depression    denies  . Diverticulosis of colon   . Dyspnea    on exertion-climbing stairs  . Dysrhythmia   . Fatty liver   . H/O cardiac radiofrequency ablation    01/ 2008 at [facility] [place] /  03/ 2010  at [facility]  . Heart failure with preserved  ejection fraction ([place])   . Heart murmur    "prior to valve repair"  . History of adenomatous polyp of colon    tubular adenoma's  . History of cardiomyopathy    secondary tachycardia-induced cardiomyopathy -- resolved 2014  . History of squamous cell carcinoma in situ (SCCIS) of skin    05/ 2017  nasal bridge and right medial knee  . History of transient ischemic attack (TIA)    01-24-2005 and 06-12-2009  . Hyperlipidemia   . Hypothyroidism   . Mild intermittent asthma    reacts to cats  . Mixed stress and urge urinary incontinence   . Pulmonary nodule   . S/P mitral valve repair 10-23-1998  dr Boyce Medici at [facility]   for MVP and regurg. (annuloplasty ring procedure)  . Swelling of left extremity 2017   states it's gotten worse  . Varicose vein of leg   . Wears glasses    Past Surgical History:  Procedure Laterality Date  . APPENDECTOMY  1978  . CARDIAC ELECTROPHYSIOLOGY [place] AND ABLATION  01/ 2008    at [place]   right-sided ablation atrial flutter  . CARDIAC ELECTROPHYSIOLOGY STUDY AND ABLATION  03/ 2010   dr Jaymes Graff at [facility]   AV node ablation and pulmonary vein isolation for atrial fib  . CARDIOVERSION  06-18-2006;  07-13-2006;  10-19-2010;  10-27-2010  . COLONOSCOPY    . CYSTO/ TRANSURETHRAL COLLAGEN INJECTION THERAPY  07-26-2007   dr Matilde Sprang  . DILATION AND CURETTAGE OF UTERUS    . EXCISIONAL HEMORRHOIDECTOMY  1980s  . HEMORRHOID SURGERY N/A 10/29/2016   Procedure: HEMORRHOIDECTOMY;  Surgeon: Leighton Ruff, MD;  Location[facility];  Service: General;  Laterality: N/A;  . MITRAL VALVE ANNULOPLASTY  10/23/1998   "Model [id number]; [employer] Lerner [id number]"; size 65mm; A [place]; Dr. Boyce Medici  . PILONIDAL CYST EXCISION  1954  . TEE WITH CARDIOVERSION  05-06-2006 at [facility];  01-02-2013 at [facility]  . TOTAL HIP ARTHROPLASTY Left 05/04/2017   Procedure: LEFT TOTAL HIP ARTHROPLASTY ANTERIOR APPROACH;  Surgeon: Mcarthur Rossetti, MD;   Location: [place];  Service: Orthopedics;  Laterality: Left;  . TRANSTHORACIC ECHOCARDIOGRAM  05-01-2015   dr Caryl Comes   ef 50-55%/  mild AV sclerosis without stenosis/  post MV repair with mild central MR (valve area by pressure half-time 2cm^2,  valve area by continutity equation 0.91cm^2, peak grandiant 35mmHg)/  severe LAE/ mild TR/ mild RAE   . TUBAL LIGATION Bilateral 1978   Social History   Tobacco Use  . Smoking status: Never Smoker  . Smokeless tobacco: Never Used  Substance  Use Topics  . Alcohol use: Yes    Alcohol/week: 0.0 oz    Comment: seldom  . Drug use: No   Family History  Problem Relation Age of Onset  . Hypertension Mother   . Lung cancer Father        smoker  . Alcohol abuse Father   . Cancer Father        bladder and lung CA smoker  . Breast cancer Neg Hx    Allergies  Allergen Reactions  . Amiodarone Hcl Swelling    SWELLING REACTION UNSPECIFIED   . Penicillins Rash    Has patient had a PCN reaction causing immediate rash, facial/tongue/throat swelling, SOB or lightheadedness with hypotension: No Has patient had a PCN reaction causing severe rash involving mucus membranes or skin necrosis: No Has patient had a PCN reaction that required hospitalization:Patient was inpatient when reaction occurred Has patient had a PCN reaction occurring within the last 10 years: No If all of the above answers are "NO", then may proceed with Cephalosporin use.   . Statins Rash   Current Outpatient Medications on File Prior to Visit  Medication Sig Dispense Refill  . acetaminophen (TYLENOL) 500 MG tablet Take 500 mg by mouth at bedtime as needed (for pain.).    [name] albuterol (PROVENTIL HFA;VENTOLIN HFA) 108 (90 Base) MCG/ACT inhaler Inhale 2 puffs into the lungs every 4 (four) hours as needed for wheezing or shortness of breath. 1 Inhaler 0  . alendronate (FOSAMAX) 70 MG tablet Take 1 tablet (70 mg total) by mouth every 7 (seven) days. Take with a full glass of water on an empty  stomach. (Patient taking differently: Take 70 mg by mouth every Sunday. Take with a full glass of water on an empty stomach.) 4 tablet 11  . Cholecalciferol (VITAMIN D3) 2000 units TABS Take 4,000 Units by mouth daily.    [name] diltiazem (CARTIA XT) 180 MG 24 hr capsule Take 1 capsule (180 mg total) by mouth daily. 90 capsule 3  . diphenhydrAMINE (BENADRYL) 25 mg capsule Take 50 mg by mouth every 6 (six) hours as needed for itching.    . fluticasone (FLONASE) 50 MCG/ACT nasal spray Place 1 spray into both nostrils daily as needed for allergies.    . furosemide (LASIX) 40 MG tablet TAKE 1/2 TABLET BY MOUTH DAILY, MAY INCREASE TO 1 TABLET BY MOUTH DAILY IF NEEDED (Patient taking differently: TAKE 0.5 TABLET (20 MG) BY MOUTH DAILY IN THE MORNING.) 90 tablet 2  . levothyroxine (SYNTHROID, LEVOTHROID) 25 MCG tablet TAKE 1 TABLET EVERY DAY BEFORE BREAKFAST 90 tablet 1  . loratadine (CLARITIN) 10 MG tablet Take 10 mg by mouth daily as needed for allergies.     Vladimir Faster Glycol-Propyl Glycol (LUBRICANT EYE DROPS) 0.4-0.3 % SOLN Place 1-2 drops into both eyes 3 (three) times daily as needed (for dry eyes.).    [name] warfarin (COUMADIN) 5 MG tablet TAKE AS DIRECTED BY ANTI COAGULATION CLINIC (Patient taking differently: Take 2.5-5 mg by mouth See admin instructions. TAKE 0.5 TABLET (2.5 MG) ON Monday, Wednesday, AND Friday. TAKE 1 TABLET (5 MG) ON ALL OTHER DAYS) 12 tablet 0   No current facility-administered medications on file prior to visit.     Review of Systems  Constitutional: Negative for activity change, appetite change, fatigue, fever and unexpected weight change.  HENT: Negative for congestion, ear pain, rhinorrhea, sinus pressure and sore throat.   Eyes: Negative for pain, redness and visual disturbance.  Respiratory: Negative for cough,  shortness of breath and wheezing.   Cardiovascular: Positive for leg swelling. Negative for chest pain and palpitations.  Gastrointestinal: Positive for abdominal  distention, abdominal pain and constipation. Negative for anal bleeding, blood in stool, diarrhea, rectal pain and vomiting.  Endocrine: Negative for polydipsia and polyuria.  Genitourinary: Negative for dysuria, frequency and urgency.  Musculoskeletal: Negative for arthralgias, back pain and myalgias.  Skin: Negative for pallor and rash.  Allergic/Immunologic: Negative for environmental allergies.  Neurological: Negative for dizziness, syncope and headaches.  Hematological: Negative for adenopathy. Does not bruise/bleed easily.  Psychiatric/Behavioral: Negative for decreased concentration and dysphoric mood. The patient is not nervous/anxious.        Objective:   Physical Exam  Constitutional: She appears well-developed and well-nourished.  Non-toxic appearance. She does not appear ill. No distress.  HENT:  Head: Normocephalic and atraumatic.  Mouth/Throat: Oropharynx is clear and moist.  Eyes: Pupils are equal, round, and reactive to light. Conjunctivae and EOM are normal.  Neck: Normal range of motion. Neck supple. No JVD present. Carotid bruit is not present. No thyromegaly present.  Cardiovascular: Normal rate, regular rhythm, normal heart sounds and intact distal pulses. Exam reveals no gallop.  Pulmonary/Chest: Effort normal and breath sounds normal. No respiratory distress. She has no wheezes. She has no rales.  No crackles  Abdominal: Soft. Bowel sounds are normal. She exhibits no distension, no abdominal bruit, no pulsatile midline mass and no mass. There is no hepatosplenomegaly. There is tenderness in the right lower quadrant, periumbilical area and left lower quadrant. There is no rigidity, no rebound, no guarding, no CVA tenderness, no tenderness at McBurney's point and negative Murphy's sign.  Musculoskeletal: She exhibits no edema.  Trace pedal edema with varicosities No pitting   Lymphadenopathy:    She has no cervical adenopathy.  Neurological: She is alert. She has  normal reflexes.  Skin: Skin is warm and dry. No rash noted.  Psychiatric: She has a normal mood and affect.          Assessment & Plan:   Problem List Items Addressed This Visit      Other   Constipation - Primary    New since her hip surgery in feb  Unusual for her -dry hard stools despite fluid intake  Lab today Will try miralax tid for now to help hard stools  Low threshold for GI ref if needed       Relevant Orders   CBC with Differential/Platelet   Comprehensive metabolic panel   TSH   Pedal edema    Suspect due to venous insuff  Lab today as well  F/u with vein clinic  Elevate feet supp hose as tol      Relevant Orders   CBC with Differential/Platelet   Comprehensive metabolic panel   TSH

## 2017-08-19 ENCOUNTER — Encounter: Payer: Self-pay | Admitting: Internal Medicine

## 2017-08-19 ENCOUNTER — Ambulatory Visit (INDEPENDENT_AMBULATORY_CARE_PROVIDER_SITE_OTHER): Payer: Medicare Other | Admitting: Internal Medicine

## 2017-08-19 VITALS — BP 120/86 | HR 90 | Temp 98.0°F | Ht 65.0 in | Wt 179.0 lb

## 2017-08-19 DIAGNOSIS — I4891 Unspecified atrial fibrillation: Secondary | ICD-10-CM

## 2017-08-19 MED ORDER — METOPROLOL TARTRATE 25 MG PO TABS: 25.0000 mg | ORAL_TABLET | Freq: Three times a day (TID) | ORAL | 0 refills | Status: DC | PRN

## 2017-08-19 NOTE — Patient Instructions (Signed)
Please take the metoprolol right away when you get home. Repeat it in an hour if your heart is not slower. Go to the ER (you should not drive) if your heart doesn't slow down by the evening. Call 911 if you get chest pain or trouble breathing

## 2017-08-19 NOTE — Assessment & Plan Note (Addendum)
Fairly classic symptoms of throat tightness and overall fatigue No ischemic changes Doesn't remember having these spells--doesn't have beta blocker for prn use Notes some past problem with metoprolol ("general swelling") Will check EKG It shows a fib at 150. 1 PVC. No ischemic changes. Since 07/02/17, R wave gone in III and R wave progression slower across precordium--but these are not worrisome.  She really looks fine now She is okay with taking short courses of metoprolol---will Rx 25mg  immediate release to try now and repeat in 1-2 hours. Call 911 if she has CP/SOB or not improving Will notify Dr Caryl Comes May want to increase regular diltiazem dose--but BP tends to run low

## 2017-08-19 NOTE — Progress Notes (Signed)
Subjective:    Patient ID: [name], female    DOB: [birth year], 84 y.o.   MRN: [id number]  HPI Here due to concern about having a stroke  Has felt some queasiness in throat and stomach Slightly "off of normal with the way I feel" Went to [facility] --BP and pulse were elevated  Some dizziness Hard to "walk a straight line" Started upon getting up this morning Eating okay--this didn't change things  No palpitations--but did have tightness in neck (which is better now) No chest pain No SOB Still some tightness in her head  Has increased furosemide to a full tablet due to increased swelling  Current Outpatient Medications on File Prior to Visit  Medication Sig Dispense Refill  . acetaminophen (TYLENOL) 500 MG tablet Take 500 mg by mouth at bedtime as needed (for pain.).    [name] albuterol (PROVENTIL HFA;VENTOLIN HFA) 108 (90 Base) MCG/ACT inhaler Inhale 2 puffs into the lungs every 4 (four) hours as needed for wheezing or shortness of breath. 1 Inhaler 0  . alendronate (FOSAMAX) 70 MG tablet Take 1 tablet (70 mg total) by mouth every 7 (seven) days. Take with a full glass of water on an empty stomach. (Patient taking differently: Take 70 mg by mouth every Sunday. Take with a full glass of water on an empty stomach.) 4 tablet 11  . Cholecalciferol (VITAMIN D3) 2000 units TABS Take 4,000 Units by mouth daily.    [name] diltiazem (CARTIA XT) 180 MG 24 hr capsule Take 1 capsule (180 mg total) by mouth daily. 90 capsule 3  . diphenhydrAMINE (BENADRYL) 25 mg capsule Take 50 mg by mouth every 6 (six) hours as needed for itching.    . fluticasone (FLONASE) 50 MCG/ACT nasal spray Place 1 spray into both nostrils daily as needed for allergies.    . furosemide (LASIX) 40 MG tablet TAKE 1/2 TABLET BY MOUTH DAILY, MAY INCREASE TO 1 TABLET BY MOUTH DAILY IF NEEDED (Patient taking differently: 1 tablet daily) 90 tablet 2  . levothyroxine (SYNTHROID, LEVOTHROID) 25 MCG tablet TAKE 1 TABLET EVERY DAY BEFORE  BREAKFAST 90 tablet 1  . loratadine (CLARITIN) 10 MG tablet Take 10 mg by mouth daily as needed for allergies.     Vladimir Faster Glycol-Propyl Glycol (LUBRICANT EYE DROPS) 0.4-0.3 % SOLN Place 1-2 drops into both eyes 3 (three) times daily as needed (for dry eyes.).    [name] warfarin (COUMADIN) 5 MG tablet TAKE AS DIRECTED BY ANTI COAGULATION CLINIC (Patient taking differently: Take 2.5-5 mg by mouth See admin instructions. TAKE 0.5 TABLET (2.5 MG) ON Monday, Wednesday, AND Friday. TAKE 1 TABLET (5 MG) ON ALL OTHER DAYS) 12 tablet 0   No current facility-administered medications on file prior to visit.     Allergies  Allergen Reactions  . Amiodarone Hcl Swelling    SWELLING REACTION UNSPECIFIED   . Penicillins Rash    Has patient had a PCN reaction causing immediate rash, facial/tongue/throat swelling, SOB or lightheadedness with hypotension: No Has patient had a PCN reaction causing severe rash involving mucus membranes or skin necrosis: No Has patient had a PCN reaction that required hospitalization:Patient was inpatient when reaction occurred Has patient had a PCN reaction occurring within the last 10 years: No If all of the above answers are "NO", then may proceed with Cephalosporin use.   . Statins Rash    Past Medical History:  Diagnosis Date  . Allergic rhinitis   . 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  Arthritis   . Atrial fibrillation -persistent cardiologist-  dr klein/  primary EP -- dr Tawanna Sat ([facility])   a. s/p PVI [name] 2010;  b. on tikosyn/coumadin;  c. 05/2009 Echo: EF 60-65%, Gr 2 DD. (first dx 09/ 2007)  . Bilateral lower extremity edema   . Bleeding hemorrhoid   . Carotid stenosis    mild (hosp 3/11)- consult by vasc/ Dr Donnetta Hutching  . Complication of anesthesia    hard to wake  . Depression    denies  . Diverticulosis of colon   . Dyspnea    on exertion-climbing stairs  . Dysrhythmia   . Fatty liver   . H/O cardiac radiofrequency ablation    01/ 2008 at [place]  [place] /  03/ 2010  at [facility]  . Heart failure with preserved ejection fraction ([place])   . Heart murmur    "prior to valve repair"  . History of adenomatous polyp of colon    tubular adenoma's  . History of cardiomyopathy    secondary tachycardia-induced cardiomyopathy -- resolved 2014  . History of squamous cell carcinoma in situ (SCCIS) of skin    05/ 2017  nasal bridge and right medial knee  . History of transient ischemic attack (TIA)    01-24-2005 and 06-12-2009  . Hyperlipidemia   . Hypothyroidism   . Mild intermittent asthma    reacts to cats  . Mixed stress and urge urinary incontinence   . Pulmonary nodule   . S/P mitral valve repair 10-23-1998  dr Boyce Medici at [facility]   for MVP and regurg. (annuloplasty ring procedure)  . Swelling of left extremity 2017   states it's gotten worse  . Varicose vein of leg   . Wears glasses     Past Surgical History:  Procedure Laterality Date  . APPENDECTOMY  1978  . CARDIAC ELECTROPHYSIOLOGY [place] AND ABLATION  01/ 2008    at [place]   right-sided ablation atrial flutter  . CARDIAC ELECTROPHYSIOLOGY STUDY AND ABLATION  03/ 2010   dr Jaymes Graff at [facility]   AV node ablation and pulmonary vein isolation for atrial fib  . CARDIOVERSION  06-18-2006;  07-13-2006;  10-19-2010;  10-27-2010  . COLONOSCOPY    . CYSTO/ TRANSURETHRAL COLLAGEN INJECTION THERAPY  07-26-2007   dr Matilde Sprang  . DILATION AND CURETTAGE OF UTERUS    . EXCISIONAL HEMORRHOIDECTOMY  1980s  . HEMORRHOID SURGERY N/A 10/29/2016   Procedure: HEMORRHOIDECTOMY;  Surgeon: Leighton Ruff, MD;  Location: [facility];  Service: General;  Laterality: N/A;  . MITRAL VALVE ANNULOPLASTY  10/23/1998   "Model 4625; [employer] [id number]"; size 76mm; [facility]; Dr. Boyce Medici  . PILONIDAL CYST EXCISION  1954  . TEE WITH CARDIOVERSION  05-06-2006 at [facility];  01-02-2013 at [facility]  . TOTAL HIP ARTHROPLASTY Left 05/04/2017   Procedure: LEFT TOTAL HIP ARTHROPLASTY  ANTERIOR APPROACH;  Surgeon: Mcarthur Rossetti, MD;  Location: [facility];  Service: Orthopedics;  Laterality: Left;  . TRANSTHORACIC ECHOCARDIOGRAM  05-01-2015   dr Caryl Comes   ef 50-55%/  mild AV sclerosis without stenosis/  post MV repair with mild central MR (valve area by pressure half-time 2cm^2,  valve area by continutity equation 0.91cm^2, peak grandiant 57mmHg)/  severe LAE/ mild TR/ mild RAE   . TUBAL LIGATION Bilateral 1978    Family History  Problem Relation Age of Onset  . Hypertension Mother   . Lung cancer Father        smoker  . Alcohol  abuse Father   . Cancer Father        bladder and lung CA smoker  . Breast cancer Neg Hx     Social History   Socioeconomic History  . Marital status: Widowed    Spouse name: Not on file  . Number of children: 6  . Years of education: Not on file  . Highest education level: Not on file  Occupational History  . Occupation: Architectural technologist: RETIRED  Social Needs  . Financial resource strain: Not on file  . Food insecurity:    Worry: Not on file    Inability: Not on file  . Transportation needs:    Medical: Not on file    Non-medical: Not on file  Tobacco Use  . Smoking status: Never Smoker  . Smokeless tobacco: Never Used  Substance and Sexual Activity  . Alcohol use: Yes    Alcohol/week: 0.0 oz    Comment: seldom  . Drug use: No  . Sexual activity: Never  Lifestyle  . Physical activity:    Days per week: Not on file    Minutes per session: Not on file  . Stress: Not on file  Relationships  . Social connections:    Talks on phone: Not on file    Gets together: Not on file    Attends religious service: Not on file    Active member of club or organization: Not on file    Attends meetings of clubs or organizations: Not on file    Relationship status: Not on file  . Intimate partner violence:    Fear of current or ex partner: Not on file    Emotionally abused: Not on file    Physically abused: Not on file     Forced sexual activity: Not on file  Other Topics Concern  . Not on file  Social History Narrative   Retired. Daily Caffeine use: 2 daily    Review of Systems Sleeping well Appetite okay Has been trying to increase activity since hip repair (water aerobics, etc) Coffee this morning--nothing different Some problems with constipation since the hip surgery    Objective:   Physical Exam  Constitutional: She appears well-developed. No distress.  Neck: No JVD present. No thyromegaly present.  Cardiovascular: Exam reveals no gallop.  No murmur heard. Irregular Rate ~134 with radial pulse of 96  Respiratory: Effort normal and breath sounds normal. No respiratory distress. She has no wheezes. She has no rales.  Musculoskeletal: She exhibits no edema.  Lymphadenopathy:    She has no cervical adenopathy.  Psychiatric: She has a normal mood and affect. Her behavior is normal.           Assessment & Plan:

## 2017-08-29 ENCOUNTER — Telehealth: Payer: Self-pay | Admitting: Family Medicine

## 2017-08-29 DIAGNOSIS — I4891 Unspecified atrial fibrillation: Secondary | ICD-10-CM

## 2017-08-29 DIAGNOSIS — E7800 Pure hypercholesterolemia, unspecified: Secondary | ICD-10-CM

## 2017-08-29 DIAGNOSIS — E039 Hypothyroidism, unspecified: Secondary | ICD-10-CM

## 2017-08-29 NOTE — Telephone Encounter (Signed)
-----   Message from Eustace Pen, LPN sent at 06/30/6114  4:24 PM EDT ----- Regarding: Labs 6/11 Lab orders needed. Thank you.  Insurance:  [employer]

## 2017-09-01 ENCOUNTER — Ambulatory Visit (INDEPENDENT_AMBULATORY_CARE_PROVIDER_SITE_OTHER): Payer: Medicare Other

## 2017-09-01 ENCOUNTER — Ambulatory Visit: Payer: Medicare Other

## 2017-09-01 ENCOUNTER — Encounter: Payer: Self-pay | Admitting: Family Medicine

## 2017-09-01 ENCOUNTER — Ambulatory Visit (INDEPENDENT_AMBULATORY_CARE_PROVIDER_SITE_OTHER): Payer: Medicare Other | Admitting: Family Medicine

## 2017-09-01 VITALS — BP 118/80 | HR 105 | Temp 98.4°F | Ht 64.25 in | Wt 175.5 lb

## 2017-09-01 DIAGNOSIS — Z0000 Encounter for general adult medical examination without abnormal findings: Secondary | ICD-10-CM

## 2017-09-01 DIAGNOSIS — I4891 Unspecified atrial fibrillation: Secondary | ICD-10-CM

## 2017-09-01 DIAGNOSIS — Z23 Encounter for immunization: Secondary | ICD-10-CM | POA: Diagnosis not present

## 2017-09-01 DIAGNOSIS — E7800 Pure hypercholesterolemia, unspecified: Secondary | ICD-10-CM

## 2017-09-01 DIAGNOSIS — M8589 Other specified disorders of bone density and structure, multiple sites: Secondary | ICD-10-CM | POA: Diagnosis not present

## 2017-09-01 DIAGNOSIS — Z1211 Encounter for screening for malignant neoplasm of colon: Secondary | ICD-10-CM

## 2017-09-01 DIAGNOSIS — E039 Hypothyroidism, unspecified: Secondary | ICD-10-CM

## 2017-09-01 LAB — CBC WITH DIFFERENTIAL/PLATELET
BASOS ABS: 0.1 10*3/uL (ref 0.0–0.1)
Basophils Relative: 1 % (ref 0.0–3.0)
EOS ABS: 0.2 10*3/uL (ref 0.0–0.7)
Eosinophils Relative: 3 % (ref 0.0–5.0)
HCT: 37.3 % (ref 36.0–46.0)
Hemoglobin: 12.1 g/dL (ref 12.0–15.0)
LYMPHS ABS: 1.4 10*3/uL (ref 0.7–4.0)
Lymphocytes Relative: 27.8 % (ref 12.0–46.0)
MCHC: 32.3 g/dL (ref 30.0–36.0)
MCV: 78.7 fl (ref 78.0–100.0)
Monocytes Absolute: 0.5 10*3/uL (ref 0.1–1.0)
Monocytes Relative: 9.1 % (ref 3.0–12.0)
NEUTROS ABS: 3.1 10*3/uL (ref 1.4–7.7)
NEUTROS PCT: 59.1 % (ref 43.0–77.0)
PLATELETS: 243 10*3/uL (ref 150.0–400.0)
RBC: 4.74 Mil/uL (ref 3.87–5.11)
RDW: 15.8 % — ABNORMAL HIGH (ref 11.5–15.5)
WBC: 5.2 10*3/uL (ref 4.0–10.5)

## 2017-09-01 LAB — COMPREHENSIVE METABOLIC PANEL
ALK PHOS: 57 U/L (ref 39–117)
ALT: 12 U/L (ref 0–35)
AST: 17 U/L (ref 0–37)
Albumin: 4.1 g/dL (ref 3.5–5.2)
BILIRUBIN TOTAL: 0.6 mg/dL (ref 0.2–1.2)
BUN: 22 mg/dL (ref 6–23)
CO2: 31 meq/L (ref 19–32)
CREATININE: 0.8 mg/dL (ref 0.40–1.20)
Calcium: 9.5 mg/dL (ref 8.4–10.5)
Chloride: 100 mEq/L (ref 96–112)
GFR: 72.84 mL/min (ref >60.00)
GLUCOSE: 93 mg/dL (ref 70–99)
Potassium: 3.8 mEq/L (ref 3.5–5.1)
Sodium: 139 mEq/L (ref 135–145)
TOTAL PROTEIN: 7.5 g/dL (ref 6.0–8.3)

## 2017-09-01 LAB — LIPID PANEL
CHOL/HDL RATIO: 6
Cholesterol: 212 mg/dL — ABNORMAL HIGH (ref 0–200)
HDL: 37.6 mg/dL — ABNORMAL LOW (ref >39.00)
LDL Cholesterol: 156 mg/dL — ABNORMAL HIGH (ref 0–99)
NonHDL: 173.97
TRIGLYCERIDES: 90 mg/dL (ref 0.0–149.0)
VLDL: 18 mg/dL (ref 0.0–40.0)

## 2017-09-01 LAB — TSH: TSH: 3.69 u[IU]/mL (ref 0.35–4.50)

## 2017-09-01 MED ORDER — ALENDRONATE SODIUM 70 MG PO TABS: 70.0000 mg | ORAL_TABLET | ORAL | 11 refills | Status: DC

## 2017-09-01 NOTE — Patient Instructions (Signed)
[name] , Thank you for taking time to come for your Medicare Wellness Visit. I appreciate your ongoing commitment to your health goals. Please review the following plan we discussed and let me know if I can assist you in the future.   These are the goals we discussed: Goals    . Weight (lb) < 160 lb (72.6 kg)     Starting 09/01/2017, I will continue to do water aerobics for 60 minutes 3 days per week.         This is a list of the screening recommended for you and due dates:  Health Maintenance  Topic Date Due  . Tetanus Vaccine  02/02/2025*  . Flu Shot  10/21/2017  . DEXA scan (bone density measurement)  Completed  . Pneumonia vaccines  Completed  *Topic was postponed. The date shown is not the original due date.   Preventive Care for Adults  A healthy lifestyle and preventive care can promote health and wellness. Preventive health guidelines for adults include the following key practices.  . A routine yearly physical is a good way to check with your health care provider about your health and preventive screening. It is a chance to share any concerns and updates on your health and to receive a thorough exam.  . Visit your dentist for a routine exam and preventive care every 6 months. Brush your teeth twice a day and floss once a day. Good oral hygiene prevents tooth decay and gum disease.  . The frequency of eye exams is based on your age, health, family medical history, use  of contact lenses, and other factors. Follow your health care provider's recommendations for frequency of eye exams.  . Eat a healthy diet. Foods like vegetables, fruits, whole grains, low-fat dairy products, and lean protein foods contain the nutrients you need without too many calories. Decrease your intake of foods high in solid fats, added sugars, and salt. Eat the right amount of calories for you. Get information about a proper diet from your health care provider, if necessary.  . Regular physical  exercise is one of the most important things you can do for your health. Most adults should get at least 150 minutes of moderate-intensity exercise (any activity that increases your heart rate and causes you to sweat) each week. In addition, most adults need muscle-strengthening exercises on 2 or more days a week.  Silver Sneakers may be a benefit available to you. To determine eligibility, you may visit the website: www.silversneakers.com or contact program at (215)102-7674 Mon-Fri between 8AM-8PM.   . Maintain a healthy weight. The body mass index (BMI) is a screening tool to identify possible weight problems. It provides an estimate of body fat based on height and weight. Your health care provider can find your BMI and can help you achieve or maintain a healthy weight.   For adults 20 years and older: ? A BMI below 18.5 is considered underweight. ? A BMI of 18.5 to 24.9 is normal. ? A BMI of 25 to 29.9 is considered overweight. ? A BMI of 30 and above is considered obese.   . Maintain normal blood lipids and cholesterol levels by exercising and minimizing your intake of saturated fat. Eat a balanced diet with plenty of fruit and vegetables. Blood tests for lipids and cholesterol should begin at age 62 and be repeated every 5 years. If your lipid or cholesterol levels are high, you are over 50, or you are at high risk for heart  disease, you may need your cholesterol levels checked more frequently. Ongoing high lipid and cholesterol levels should be treated with medicines if diet and exercise are not working.  . If you smoke, find out from your health care provider how to quit. If you do not use tobacco, please do not start.  . If you choose to drink alcohol, please do not consume more than 2 drinks per day. One drink is considered to be 12 ounces (355 mL) of beer, 5 ounces (148 mL) of wine, or 1.5 ounces (44 mL) of liquor.  . If you are 52-23 years old, ask your health care provider if you  should take aspirin to prevent strokes.  . Use sunscreen. Apply sunscreen liberally and repeatedly throughout the day. You should seek shade when your shadow is shorter than you. Protect yourself by wearing long sleeves, pants, a wide-brimmed hat, and sunglasses year round, whenever you are outdoors.  . Once a month, do a whole body skin exam, using a mirror to look at the skin on your back. Tell your health care provider of new moles, moles that have irregular borders, moles that are larger than a pencil eraser, or moles that have changed in shape or color.

## 2017-09-01 NOTE — Progress Notes (Signed)
PCP notes:   Health maintenance:  PPSV23 - will administer at CPE  Abnormal screenings:   Fall risk - hx of single fall Fall Risk  09/01/2017 08/26/2016 05/15/2015  Falls in the past year? Yes No No  Comment pt tripped causing multiple bruises - -  Number falls in past yr: 1 - -  Injury with Fall? Yes - -   Patient concerns:   None  Nurse concerns:  None  Next PCP appt:   09/01/17 @ 1030  I reviewed health advisor's note, was available for consultation, and agree with documentation and plan. [name] MD

## 2017-09-01 NOTE — Patient Instructions (Addendum)
Pneumonia shot today   We will sign you up for the cologuard program for colon cancer screening   Your mammogram is due in July   Labs today   Take good care of yourself

## 2017-09-01 NOTE — Progress Notes (Signed)
Subjective:    Patient ID: [name], female    DOB: 03-13-1933, 84 y.o.   MRN: [id number]  HPI Here for annual f/u of chronic medical problems   Feeling good  Bought a new house - close by/ getting ready to move (one level house) This will keep her busy / and make her more active  Hip feels great   Was here 2 wk ago - fatigue and dizzy and found to have rapid rate a fib 150  That has never had that happen with her afib before  Px metoprolol - prn  Notified Dr Caryl Comes per note   amw today  No gaps or problems  Had hip replacement this year    Wt Readings from Last 3 Encounters:  09/01/17 175 lb 8 oz (79.6 kg)  08/19/17 179 lb (81.2 kg)  08/02/17 182 lb 8 oz (82.8 kg)  down 7 lb  She says she had gone down as low as 155 (going off of carbs) - then stopped moving with hip pain  bmi is 29.8  Tetanus shot 11/06  dexa 7/18- osteopenia with T score -2.0 in the FN Fosamax -no problems  Fall /fx -none new  Takes vit D  Colonoscopy 3/16 -adenomas with no recall due to age Has hemorrhoids  Less constipated/ taking fiber  Doing better overall    Due for PNA 23 today  Mammogram 7/18 negative , she generally gets a reminder  Self breast exam - no breast lumps   Zoster status -not interested in a shingles vaccine   BP Readings from Last 3 Encounters:  09/01/17 118/80  09/01/17 118/80  08/19/17 120/86  did not take medicines again today  More edema lately -she did inc her furosemide to 1 whole pill daily  No sob (hx of CHF)   Pulse Readings from Last 3 Encounters:  09/01/17 (!) 105  09/01/17 (!) 105  08/19/17 90   Hyperlipidemia Due for a check  Lab Results  Component Value Date   CHOL 246 (H) 08/26/2016   HDL 49.70 08/26/2016   LDLCALC 178 (H) 08/26/2016   LDLDIRECT 203.8 01/09/2008   TRIG 95.0 08/26/2016   CHOLHDL 5 08/26/2016   Will check labs today   Lab Results  Component Value Date   TSH 2.92 08/02/2017    No clinical changes -feels pretty  good    Patient Active Problem List   Diagnosis Date Noted  . Rapid atrial fibrillation ([place]) 08/19/2017  . Constipation 08/02/2017  . Unilateral primary osteoarthritis, left hip 05/04/2017  . Status post total replacement of left hip 05/04/2017  . Hip osteoarthritis 04/27/2017  . Long term (current) use of anticoagulants 03/04/2017  . Venous stasis dermatitis of both lower extremities 01/08/2017  . Impacted cerumen of right ear 11/20/2016  . Osteopenia 10/25/2016  . Pedal edema 08/26/2016  . Varicose veins of both lower extremities 08/26/2016  . Estrogen deficiency 08/26/2016  . Screening mammogram, encounter for 08/26/2016  . Hemorrhoids 08/26/2016  . Epistaxis 01/07/2016  . Urticaria 08/22/2014  . Hip pain 08/02/2014  . Left knee pain 08/02/2014  . Chronic cough 05/08/2014  . Caregiver stress 08/16/2013  . Colon cancer screening 08/16/2013  . Encounter for therapeutic drug monitoring 04/20/2013  . Left ovarian cyst 03/14/2013  . (HFpEF) heart failure with preserved ejection fraction ([place]) 12/27/2012  . Cardiomyopathy, secondary --Resolved again 10/14 10/13/2010  . COLONIC POLYPS, ADENOMATOUS, HX OF 09/18/2009  . PULMONARY NODULE 12/20/2008  . GANGLION CYST 10/04/2007  .  Hyperlipidemia 04/27/2007  . DEPRESSION 04/27/2007  . Asthma, mild intermittent 04/27/2007  . INSOMNIA 04/27/2007  . ADENOMATOUS COLONIC POLYP 11/04/2006  . Hypothyroidism 09/02/2006  . Atrial fibrillation ([place]) 08/05/2006   Past Medical History:  Diagnosis Date  . Allergic rhinitis   . 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 Arthritis   . Atrial fibrillation -persistent cardiologist-  dr klein/  primary EP -- dr Tawanna Sat ([facility])   a. s/p PVI [name] 2010;  b. on tikosyn/coumadin;  c. 05/2009 Echo: EF 60-65%, Gr 2 DD. (first dx 09/ 2007)  . Bilateral lower extremity edema   . Bleeding hemorrhoid   . Carotid stenosis    mild (hosp 3/11)- consult by vasc/ Dr Donnetta Hutching  . Complication of anesthesia    hard to  wake  . Depression    denies  . Diverticulosis of colon   . Dyspnea    on exertion-climbing stairs  . Dysrhythmia   . Fatty liver   . H/O cardiac radiofrequency ablation    01/ 2008 at [facility] /  03/ 2010  at [facility]  . Heart failure with preserved ejection fraction ([place])   . Heart murmur    "prior to valve repair"  . History of adenomatous polyp of colon    tubular adenoma's  . History of cardiomyopathy    secondary tachycardia-induced cardiomyopathy -- resolved 2014  . History of squamous cell carcinoma in situ (SCCIS) of skin    05/ 2017  nasal bridge and right medial knee  . History of transient ischemic attack (TIA)    01-24-2005 and 06-12-2009  . Hyperlipidemia   . Hypothyroidism   . Mild intermittent asthma    reacts to cats  . Mixed stress and urge urinary incontinence   . Pulmonary nodule   . S/P mitral valve repair 10-23-1998  dr Boyce Medici at [facility]   for MVP and regurg. (annuloplasty ring procedure)  . Swelling of left extremity 2017   states it's gotten worse  . Varicose vein of leg   . Wears glasses    Past Surgical History:  Procedure Laterality Date  . APPENDECTOMY  1978  . CARDIAC ELECTROPHYSIOLOGY [place] AND ABLATION  01/ 2008    at [place]   right-sided ablation atrial flutter  . CARDIAC ELECTROPHYSIOLOGY STUDY AND ABLATION  03/ 2010   dr Jaymes Graff at [facility]   AV node ablation and pulmonary vein isolation for atrial fib  . CARDIOVERSION  06-18-2006;  07-13-2006;  10-19-2010;  10-27-2010  . COLONOSCOPY    . CYSTO/ TRANSURETHRAL COLLAGEN INJECTION THERAPY  07-26-2007   dr Matilde Sprang  . DILATION AND CURETTAGE OF UTERUS    . EXCISIONAL HEMORRHOIDECTOMY  1980s  . HEMORRHOID SURGERY N/A 10/29/2016   Procedure: HEMORRHOIDECTOMY;  Surgeon: Leighton Ruff, MD;  Location: [facility];  Service: General;  Laterality: N/A;  . MITRAL VALVE ANNULOPLASTY  10/23/1998   "Model 4625; [employer]ner [id number]"; size 59mm; [facility]; Dr. Boyce Medici  . PILONIDAL CYST EXCISION  1954  . TEE WITH CARDIOVERSION  05-06-2006 at [facility];  01-02-2013 at [facility]  . TOTAL HIP ARTHROPLASTY Left 05/04/2017   Procedure: LEFT TOTAL HIP ARTHROPLASTY ANTERIOR APPROACH;  Surgeon: Mcarthur Rossetti, MD;  Location: [place];  Service: Orthopedics;  Laterality: Left;  . TRANSTHORACIC ECHOCARDIOGRAM  05-01-2015   dr Caryl Comes   ef 50-55%/  mild AV sclerosis without stenosis/  post MV repair with mild central MR (valve area by pressure half-time 2cm^2,  valve area  by continutity equation 0.91cm^2, peak grandiant 61mmHg)/  severe LAE/ mild TR/ mild RAE   . TUBAL LIGATION Bilateral 1978   Social History   Tobacco Use  . Smoking status: Never Smoker  . Smokeless tobacco: Never Used  Substance Use Topics  . Alcohol use: Yes    Alcohol/week: 0.0 oz    Comment: seldom  . Drug use: No   Family History  Problem Relation Age of Onset  . Hypertension Mother   . Lung cancer Father        smoker  . Alcohol abuse Father   . Cancer Father        bladder and lung CA smoker  . Breast cancer Neg Hx    Allergies  Allergen Reactions  . Amiodarone Hcl Swelling    SWELLING REACTION UNSPECIFIED   . Penicillins Rash    Has patient had a PCN reaction causing immediate rash, facial/tongue/throat swelling, SOB or lightheadedness with hypotension: No Has patient had a PCN reaction causing severe rash involving mucus membranes or skin necrosis: No Has patient had a PCN reaction that required hospitalization:Patient was inpatient when reaction occurred Has patient had a PCN reaction occurring within the last 10 years: No If all of the above answers are "NO", then may proceed with Cephalosporin use.   . Statins Rash   Current Outpatient Medications on File Prior to Visit  Medication Sig Dispense Refill  . acetaminophen (TYLENOL) 500 MG tablet Take 500 mg by mouth at bedtime as needed (for pain.).    [name] albuterol (PROVENTIL HFA;VENTOLIN HFA) 108 (90 Base)  MCG/ACT inhaler Inhale 2 puffs into the lungs every 4 (four) hours as needed for wheezing or shortness of breath. 1 Inhaler 0  . Cholecalciferol (VITAMIN D3) 2000 units TABS Take 4,000 Units by mouth daily.    [name] diltiazem (CARTIA XT) 180 MG 24 hr capsule Take 1 capsule (180 mg total) by mouth daily. 90 capsule 3  . diphenhydrAMINE (BENADRYL) 25 mg capsule Take 50 mg by mouth every 6 (six) hours as needed for itching.    . fluticasone (FLONASE) 50 MCG/ACT nasal spray Place 1 spray into both nostrils daily as needed for allergies.    . furosemide (LASIX) 40 MG tablet TAKE 1/2 TABLET BY MOUTH DAILY, MAY INCREASE TO 1 TABLET BY MOUTH DAILY IF NEEDED (Patient taking differently: 1 tablet daily) 90 tablet 2  . levothyroxine (SYNTHROID, LEVOTHROID) 25 MCG tablet TAKE 1 TABLET EVERY DAY BEFORE BREAKFAST 90 tablet 1  . loratadine (CLARITIN) 10 MG tablet Take 10 mg by mouth daily as needed for allergies.     . metoprolol tartrate (LOPRESSOR) 25 MG tablet Take 1 tablet (25 mg total) by mouth 3 (three) times daily as needed. For fast heart rate 60 tablet 0  . Polyethyl Glycol-Propyl Glycol (LUBRICANT EYE DROPS) 0.4-0.3 % SOLN Place 1-2 drops into both eyes 3 (three) times daily as needed (for dry eyes.).    [name] warfarin (COUMADIN) 5 MG tablet TAKE AS DIRECTED BY ANTI COAGULATION CLINIC (Patient taking differently: Take 2.5-5 mg by mouth See admin instructions. TAKE 0.5 TABLET (2.5 MG) ON Monday, Wednesday, AND Friday. TAKE 1 TABLET (5 MG) ON ALL OTHER DAYS) 12 tablet 0   No current facility-administered medications on file prior to visit.     Review of Systems  Constitutional: Negative for activity change, appetite change, fatigue, fever and unexpected weight change.  HENT: Negative for congestion, ear pain, rhinorrhea, sinus pressure and sore throat.   Eyes: Negative for  pain, redness and visual disturbance.  Respiratory: Negative for cough, shortness of breath and wheezing.   Cardiovascular: Negative for  chest pain and palpitations.  Gastrointestinal: Negative for abdominal pain, blood in stool, constipation and diarrhea.  Endocrine: Negative for polydipsia and polyuria.  Genitourinary: Negative for dysuria, frequency and urgency.  Musculoskeletal: Negative for arthralgias, back pain and myalgias.  Skin: Negative for pallor and rash.  Allergic/Immunologic: Negative for environmental allergies.  Neurological: Negative for dizziness, syncope and headaches.  Hematological: Negative for adenopathy. Does not bruise/bleed easily.  Psychiatric/Behavioral: Negative for decreased concentration and dysphoric mood. The patient is not nervous/anxious.        Objective:   Physical Exam  Constitutional: She appears well-developed and well-nourished. No distress.  Well appearing   HENT:  Head: Normocephalic and atraumatic.  Right Ear: External ear normal.  Left Ear: External ear normal.  Mouth/Throat: Oropharynx is clear and moist.  Eyes: Pupils are equal, round, and reactive to light. Conjunctivae and EOM are normal. No scleral icterus.  Neck: Normal range of motion. Neck supple. No JVD present. Carotid bruit is not present. No thyromegaly present.  Cardiovascular: Normal rate, regular rhythm, normal heart sounds and intact distal pulses. Exam reveals no gallop.  Pulmonary/Chest: Effort normal and breath sounds normal. No respiratory distress. She has no wheezes. She exhibits no tenderness. No breast tenderness, discharge or bleeding.  Abdominal: Soft. Bowel sounds are normal. She exhibits no distension, no abdominal bruit and no mass. There is no tenderness.  Genitourinary: No breast tenderness, discharge or bleeding.  Genitourinary Comments: Breast exam: No mass, nodules, thickening, tenderness, bulging, retraction, inflamation, nipple discharge or skin changes noted.  No axillary or clavicular LA.      Musculoskeletal: Normal range of motion. She exhibits no edema or tenderness.  Lymphadenopathy:      She has no cervical adenopathy.  Neurological: She is alert. She has normal reflexes. No cranial nerve deficit. She exhibits normal muscle tone. Coordination normal.  Skin: Skin is warm and dry. No rash noted. No erythema. No pallor.  Solar lentigines diffusely   Psychiatric: She has a normal mood and affect.  Pleasant           Assessment & Plan:   Problem List Items Addressed This Visit      Cardiovascular and Mediastinum   Atrial fibrillation ([place]) - Primary    Had an episode with RVR 2 weeks ago Now resolved  Metoprolol prn Continue f/u with Dr Caryl Comes         Endocrine   Hypothyroidism    Hypothyroidism  Pt has no clinical changes No change in energy level/ hair or skin/ edema and no tremor Lab Results  Component Value Date   TSH 3.69 09/01/2017            Musculoskeletal and Integument   Osteopenia    Tolerating alendronate Rev dexa 7/18  Disc ca and D and exercise         Other   Colon cancer screening    Signed up for cologuard program       Hyperlipidemia    Disc goals for lipids and reasons to control them Rev last labs with pt Rev low sat fat diet in detail Lab today -due for re check         Other Visit Diagnoses    Need for 23-polyvalent pneumococcal polysaccharide vaccine       Relevant Orders   Pneumococcal polysaccharide vaccine 23-valent greater than or equal to 2yo  subcutaneous/IM (Completed)

## 2017-09-01 NOTE — Progress Notes (Signed)
Subjective:   [name] is a 84 y.o. female who presents for Medicare Annual (Subsequent) preventive examination.  Review of Systems:  N/A Cardiac Risk Factors include: advanced age (>39men, >60 women);dyslipidemia     Objective:     Vitals: BP 118/80 (BP Location: Right Arm, Patient Position: Sitting, Cuff Size: Normal)   Pulse (!) 105   Temp 98.4 F (36.9 C) (Oral)   Ht 5' 4.25" (1.632 m) Comment: no shoes  Wt 175 lb 8 oz (79.6 kg)   SpO2 96%   BMI 29.89 kg/m   Body mass index is 29.89 kg/m.  Advanced Directives 09/01/2017 07/02/2017 04/28/2017 02/14/2017 10/29/2016 08/26/2016 04/26/2014  Does Patient Have a Medical Advance Directive? Yes No Yes Yes Yes Yes Yes  Type of Paramedic of [place];Living will - [place];Living will Living will;Healthcare Power of [place];Living will [place];Living will Living will  Does patient want to make changes to medical advance directive? - - No - Patient declined - - - -  Copy of [place] in Chart? No - copy requested - No - copy requested - No - copy requested No - copy requested No - copy requested  Would patient like information on creating a medical advance directive? - No - Patient declined - - - - -    Tobacco Social History   Tobacco Use  Smoking Status Never Smoker  Smokeless Tobacco Never Used     Counseling given: No   Clinical Intake:  Pre-visit preparation completed: Yes  Pain : No/denies pain Pain Score: 0-No pain     Nutritional Status: BMI 25 -29 Overweight Nutritional Risks: None Diabetes: No  How often do you need to have someone help you when you read instructions, pamphlets, or other written materials from your doctor or pharmacy?: 1 - Never What is the last grade level you completed in school?: 12th grade + 2 yrs college courses  Interpreter Needed?: No  Comments: pt is a widow and lives  alone Information entered by :: LPinson, LPN  Past Medical History:  Diagnosis Date  . Allergic rhinitis   . 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 Arthritis   . Atrial fibrillation -persistent cardiologist-  dr klein/  primary EP -- [facility] Tawanna Sat ([facility])   a. s/p PVI [name] 2010;  b. on tikosyn/coumadin;  c. 05/2009 Echo: EF 60-65%, Gr 2 DD. (first dx 09/ 2007)  . Bilateral lower extremity edema   . Bleeding hemorrhoid   . Carotid stenosis    mild (hosp 3/11)- consult by vasc/ Dr Donnetta Hutching  . Complication of anesthesia    hard to wake  . Depression    denies  . Diverticulosis of colon   . Dyspnea    on exertion-climbing stairs  . Dysrhythmia   . Fatty liver   . H/O cardiac radiofrequency ablation    01/ 2008 at [facility] /  03/ 2010  at [facility]  . Heart failure with preserved ejection fraction ([place])   . Heart murmur    "prior to valve repair"  . History of adenomatous polyp of colon    tubular adenoma's  . History of cardiomyopathy    secondary tachycardia-induced cardiomyopathy -- resolved 2014  . History of squamous cell carcinoma in situ (SCCIS) of skin    05/ 2017  nasal bridge and right medial knee  . History of transient ischemic attack (TIA)    01-24-2005  and 06-12-2009  . Hyperlipidemia   . Hypothyroidism   . Mild intermittent asthma    reacts to cats  . Mixed stress and urge urinary incontinence   . Pulmonary nodule   . S/P mitral valve repair 10-23-1998  dr Boyce Medici at [facility]   for MVP and regurg. (annuloplasty ring procedure)  . Swelling of left extremity 2017   states it's gotten worse  . Varicose vein of leg   . Wears glasses    Past Surgical History:  Procedure Laterality Date  . APPENDECTOMY  1978  . CARDIAC ELECTROPHYSIOLOGY [place] AND ABLATION  01/ 2008    at [facility]   right-sided ablation atrial flutter  . CARDIAC ELECTROPHYSIOLOGY STUDY AND ABLATION  03/ 2010   dr Jaymes Graff at [facility]   AV node ablation and pulmonary vein  isolation for atrial fib  . CARDIOVERSION  06-18-2006;  07-13-2006;  10-19-2010;  10-27-2010  . COLONOSCOPY    . CYSTO/ TRANSURETHRAL COLLAGEN INJECTION THERAPY  07-26-2007   dr Matilde Sprang  . DILATION AND CURETTAGE OF UTERUS    . EXCISIONAL HEMORRHOIDECTOMY  1980s  . HEMORRHOID SURGERY N/A 10/29/2016   Procedure: HEMORRHOIDECTOMY;  Surgeon: Leighton Ruff, MD;  Location: [facility];  Service: General;  Laterality: N/A;  . MITRAL VALVE ANNULOPLASTY  10/23/1998   "Model [id number]; [employer] Lerner [id number]"; size 69mm; [facility]; Dr. Boyce Medici  . PILONIDAL CYST EXCISION  1954  . TEE WITH CARDIOVERSION  05-06-2006 at [facility];  01-02-2013 at [facility]  . TOTAL HIP ARTHROPLASTY Left 05/04/2017   Procedure: LEFT TOTAL HIP ARTHROPLASTY ANTERIOR APPROACH;  Surgeon: Mcarthur Rossetti, MD;  Location: [place];  Service: Orthopedics;  Laterality: Left;  . TRANSTHORACIC ECHOCARDIOGRAM  05-01-2015   dr Caryl Comes   ef 50-55%/  mild AV sclerosis without stenosis/  post MV repair with mild central MR (valve area by pressure half-time 2cm^2,  valve area by continutity equation 0.91cm^2, peak grandiant 55mmHg)/  severe LAE/ mild TR/ mild RAE   . TUBAL LIGATION Bilateral 1978   Family History  Problem Relation Age of Onset  . Hypertension Mother   . Lung cancer Father        smoker  . Alcohol abuse Father   . Cancer Father        bladder and lung CA smoker  . Breast cancer Neg Hx    Social History   Socioeconomic History  . Marital status: Widowed    Spouse name: Not on file  . Number of children: 6  . Years of education: Not on file  . Highest education level: Not on file  Occupational History  . Occupation: Architectural technologist: RETIRED  Social Needs  . Financial resource strain: Not on file  . Food insecurity:    Worry: Not on file    Inability: Not on file  . Transportation needs:    Medical: Not on file    Non-medical: Not on file  Tobacco Use  . Smoking status: Never Smoker  . Smokeless  tobacco: Never Used  Substance and Sexual Activity  . Alcohol use: Yes    Alcohol/week: 0.0 oz    Comment: seldom  . Drug use: No  . Sexual activity: Not Currently  Lifestyle  . Physical activity:    Days per week: Not on file    Minutes per session: Not on file  . Stress: Not on file  Relationships  . Social connections:    Talks on phone: Not on file  Gets together: Not on file    Attends religious service: Not on file    Active member of club or organization: Not on file    Attends meetings of clubs or organizations: Not on file    Relationship status: Not on file  Other Topics Concern  . Not on file  Social History Narrative   Retired. Daily Caffeine use: 2 daily     Outpatient Encounter Medications as of 09/01/2017  Medication Sig  . acetaminophen (TYLENOL) 500 MG tablet Take 500 mg by mouth at bedtime as needed (for pain.).  [name] albuterol (PROVENTIL HFA;VENTOLIN HFA) 108 (90 Base) MCG/ACT inhaler Inhale 2 puffs into the lungs every 4 (four) hours as needed for wheezing or shortness of breath.  . Cholecalciferol (VITAMIN D3) 2000 units TABS Take 4,000 Units by mouth daily.  [name] diltiazem (CARTIA XT) 180 MG 24 hr capsule Take 1 capsule (180 mg total) by mouth daily.  . diphenhydrAMINE (BENADRYL) 25 mg capsule Take 50 mg by mouth every 6 (six) hours as needed for itching.  . fluticasone (FLONASE) 50 MCG/ACT nasal spray Place 1 spray into both nostrils daily as needed for allergies.  . furosemide (LASIX) 40 MG tablet TAKE 1/2 TABLET BY MOUTH DAILY, MAY INCREASE TO 1 TABLET BY MOUTH DAILY IF NEEDED (Patient taking differently: 1 tablet daily)  . levothyroxine (SYNTHROID, LEVOTHROID) 25 MCG tablet TAKE 1 TABLET EVERY DAY BEFORE BREAKFAST  . loratadine (CLARITIN) 10 MG tablet Take 10 mg by mouth daily as needed for allergies.   . metoprolol tartrate (LOPRESSOR) 25 MG tablet Take 1 tablet (25 mg total) by mouth 3 (three) times daily as needed. For fast heart rate  . Polyethyl  Glycol-Propyl Glycol (LUBRICANT EYE DROPS) 0.4-0.3 % SOLN Place 1-2 drops into both eyes 3 (three) times daily as needed (for dry eyes.).  [name] warfarin (COUMADIN) 5 MG tablet TAKE AS DIRECTED BY ANTI COAGULATION CLINIC (Patient taking differently: Take 2.5-5 mg by mouth See admin instructions. TAKE 0.5 TABLET (2.5 MG) ON Monday, Wednesday, AND Friday. TAKE 1 TABLET (5 MG) ON ALL OTHER DAYS)  . [DISCONTINUED] alendronate (FOSAMAX) 70 MG tablet Take 1 tablet (70 mg total) by mouth every 7 (seven) days. Take with a full glass of water on an empty stomach. (Patient taking differently: Take 70 mg by mouth every Sunday. Take with a full glass of water on an empty stomach.)   No facility-administered encounter medications on file as of 09/01/2017.     Activities of Daily Living In your present state of health, do you have any difficulty performing the following activities: 09/01/2017 04/28/2017  Hearing? Y Y  Comment - hearing aids bilateral  Vision? N N  Difficulty concentrating or making decisions? N N  Walking or climbing stairs? N Y  Dressing or bathing? N N  Doing errands, shopping? N -  Preparing Food and eating ? N -  Using the Toilet? N -  In the past six months, have you accidently leaked urine? N -  Do you have problems with loss of bowel control? N -  Managing your Medications? N -  Managing your Finances? N -  Housekeeping or managing your Housekeeping? N -  Some recent data might be hidden    Patient Care Team: Tower, Wynelle Fanny, MD as PCP - General    Assessment:   This is a routine wellness examination for [facility].  Exercise Activities and Dietary recommendations Current Exercise Habits: Home exercise routine, Type of exercise: Other - see comments(water  aerobics), Time (Minutes): 60, Frequency (Times/Week): 3, Weekly Exercise (Minutes/Week): 180, Intensity: Moderate, Exercise limited by: None identified  Goals    . Weight (lb) < 160 lb (72.6 kg)     Starting 09/01/2017, I will  continue to do water aerobics for 60 minutes 3 days per week.         Fall Risk Fall Risk  09/01/2017 08/26/2016 05/15/2015  Falls in the past year? Yes No No  Comment pt tripped causing multiple bruises - -  Number falls in past yr: 1 - -  Injury with Fall? Yes - -   Depression Screen PHQ 2/9 Scores 09/01/2017 08/26/2016 05/15/2015  PHQ - 2 Score 0 0 0  PHQ- 9 Score 0 - -     Cognitive Function MMSE - Mini Mental State Exam 09/01/2017 08/26/2016  Orientation to time 5 5  Orientation to Place 5 5  Registration 3 3  Attention/ Calculation 0 0  Recall 3 2  Recall-comments - pt was unable to recall 1 of 3 words  Language- name 2 objects 0 0  Language- repeat 1 1  Language- follow 3 step command 3 3  Language- read & follow direction 0 0  Write a sentence 0 0  Copy design 0 0  Total score 20 19     PLEASE NOTE: A Mini-Cog screen was completed. Maximum score is 20. A value of 0 denotes this part of [place] MMSE was not completed or the patient failed this part of the Mini-Cog screening.   Mini-Cog Screening Orientation to Time - Max 5 pts Orientation to Place - Max 5 pts Registration - Max 3 pts Recall - Max 3 pts Language Repeat - Max 1 pts Language Follow 3 Step Command - Max 3 pts     Immunization History  Administered Date(s) Administered  . Influenza,inj,Quad PF,6+ Mos 01/19/2017  . Pneumococcal Conjugate-13 08/26/2016  . Pneumococcal Polysaccharide-23 09/01/2017  . Td 02/03/2005    Screening Tests Health Maintenance  Topic Date Due  . TETANUS/TDAP  02/02/2025 (Originally 02/04/2015)  . INFLUENZA VACCINE  10/21/2017  . DEXA SCAN  Completed  . PNA vac Low Risk Adult  Completed      Plan:     I have personally reviewed, addressed, and noted the following in the patient's chart:  A. Medical and social history B. Use of alcohol, tobacco or illicit drugs  C. Current medications and supplements D. Functional ability and status E.  Nutritional status F.  Physical  activity G. Advance directives H. List of other physicians I.  Hospitalizations, surgeries, and ER visits in previous 12 months J.  [place] to include hearing, vision, cognitive, depression L. Referrals and appointments - none  In addition, I have reviewed and discussed with patient certain preventive protocols, quality metrics, and best practice recommendations. A written personalized care plan for preventive services as well as general preventive health recommendations were provided to patient.  See attached scanned questionnaire for additional information.   Signed,   Lindell Noe, MHA, BS, LPN Health Coach

## 2017-09-04 ENCOUNTER — Other Ambulatory Visit: Payer: Self-pay | Admitting: Internal Medicine

## 2017-09-05 NOTE — Assessment & Plan Note (Signed)
Hypothyroidism  Pt has no clinical changes No change in energy level/ hair or skin/ edema and no tremor Lab Results  Component Value Date   TSH 3.69 09/01/2017

## 2017-09-05 NOTE — Assessment & Plan Note (Signed)
Tolerating alendronate Rev dexa 7/18  Disc ca and D and exercise

## 2017-09-05 NOTE — Assessment & Plan Note (Signed)
Disc goals for lipids and reasons to control them Rev last labs with pt Rev low sat fat diet in detail Lab today -due for re check

## 2017-09-05 NOTE — Assessment & Plan Note (Signed)
Had an episode with RVR 2 weeks ago Now resolved  Metoprolol prn Continue f/u with Dr Caryl Comes

## 2017-09-05 NOTE — Assessment & Plan Note (Signed)
Signed up for cologuard program 

## 2017-09-09 ENCOUNTER — Ambulatory Visit (INDEPENDENT_AMBULATORY_CARE_PROVIDER_SITE_OTHER): Payer: Medicare Other | Admitting: General Practice

## 2017-09-09 DIAGNOSIS — Z7901 Long term (current) use of anticoagulants: Secondary | ICD-10-CM | POA: Diagnosis not present

## 2017-09-09 DIAGNOSIS — I4891 Unspecified atrial fibrillation: Secondary | ICD-10-CM

## 2017-09-09 LAB — POCT INR: INR: 1.9 — AB (ref 2.0–3.0)

## 2017-09-09 NOTE — Patient Instructions (Addendum)
Pre visit review using our clinic review tool, if applicable. No additional management support is needed unless otherwise documented below in the visit note.  Take 1 1/2 tablets today and then continue to take 1 pill (5mg ) daily EXCEPT for 1/2 pill (2.5mg ) on Mondays, Wednesdays and Fridays.   Re-check in 4 weeks.

## 2017-09-14 DIAGNOSIS — Z1212 Encounter for screening for malignant neoplasm of rectum: Secondary | ICD-10-CM | POA: Diagnosis not present

## 2017-09-14 DIAGNOSIS — Z1211 Encounter for screening for malignant neoplasm of colon: Secondary | ICD-10-CM | POA: Diagnosis not present

## 2017-09-14 LAB — COLOGUARD: Cologuard: NEGATIVE

## 2017-09-20 ENCOUNTER — Ambulatory Visit: Payer: Self-pay | Admitting: *Deleted

## 2017-09-20 ENCOUNTER — Emergency Department
Admission: EM | Admit: 2017-09-20 | Discharge: 2017-09-20 | Disposition: A | Discharge status: Home / Self Care | Payer: Medicare Other | Attending: Emergency Medicine | Admitting: Emergency Medicine

## 2017-09-20 ENCOUNTER — Other Ambulatory Visit: Payer: Self-pay

## 2017-09-20 DIAGNOSIS — J4520 Mild intermittent asthma, uncomplicated: Secondary | ICD-10-CM | POA: Insufficient documentation

## 2017-09-20 DIAGNOSIS — E039 Hypothyroidism, unspecified: Secondary | ICD-10-CM | POA: Diagnosis not present

## 2017-09-20 DIAGNOSIS — K625 Hemorrhage of anus and rectum: Secondary | ICD-10-CM | POA: Diagnosis not present

## 2017-09-20 DIAGNOSIS — I5030 Unspecified diastolic (congestive) heart failure: Secondary | ICD-10-CM | POA: Diagnosis not present

## 2017-09-20 DIAGNOSIS — I4891 Unspecified atrial fibrillation: Secondary | ICD-10-CM | POA: Diagnosis not present

## 2017-09-20 LAB — COMPREHENSIVE METABOLIC PANEL
ALT: 12 U/L (ref 0–44)
AST: 21 U/L (ref 15–41)
Albumin: 3.9 g/dL (ref 3.5–5.0)
Alkaline Phosphatase: 60 U/L (ref 38–126)
Anion gap: 8 (ref 5–15)
BILIRUBIN TOTAL: 0.8 mg/dL (ref 0.3–1.2)
BUN: 17 mg/dL (ref 8–23)
CO2: 26 mmol/L (ref 22–32)
CREATININE: 0.63 mg/dL (ref 0.44–1.00)
Calcium: 8.6 mg/dL — ABNORMAL LOW (ref 8.9–10.3)
Chloride: 106 mmol/L (ref 98–111)
GFR calc Af Amer: >60 mL/min (ref >60)
Glucose, Bld: 121 mg/dL — ABNORMAL HIGH (ref 70–99)
Potassium: 3.7 mmol/L (ref 3.5–5.1)
Sodium: 140 mmol/L (ref 135–145)
TOTAL PROTEIN: 7.5 g/dL (ref 6.5–8.1)

## 2017-09-20 LAB — CBC WITH DIFFERENTIAL/PLATELET
Basophils Absolute: 0 10*3/uL (ref 0–0.1)
Basophils Relative: 1 %
Eosinophils Absolute: 0.2 10*3/uL (ref 0–0.7)
Eosinophils Relative: 3 %
HEMATOCRIT: 33.6 % — AB (ref 35.0–47.0)
Hemoglobin: 11.3 g/dL — ABNORMAL LOW (ref 12.0–16.0)
LYMPHS ABS: 1.1 10*3/uL (ref 1.0–3.6)
LYMPHS PCT: 23 %
MCH: 25.9 pg — AB (ref 26.0–34.0)
MCHC: 33.5 g/dL (ref 32.0–36.0)
MCV: 77.4 fL — AB (ref 80.0–100.0)
MONO ABS: 0.4 10*3/uL (ref 0.2–0.9)
MONOS PCT: 8 %
NEUTROS ABS: 3.3 10*3/uL (ref 1.4–6.5)
Neutrophils Relative %: 65 %
Platelets: 193 10*3/uL (ref 150–440)
RBC: 4.35 MIL/uL (ref 3.80–5.20)
RDW: 16.1 % — AB (ref 11.5–14.5)
WBC: 5 10*3/uL (ref 3.6–11.0)

## 2017-09-20 LAB — PROTIME-INR
INR: 1.8
Prothrombin Time: 20.7 seconds — ABNORMAL HIGH (ref 11.4–15.2)

## 2017-09-20 LAB — TROPONIN I: Troponin I: <0.03 ng/mL (ref <0.03)

## 2017-09-20 MED ORDER — DILTIAZEM HCL 25 MG/5ML IV SOLN
10.0000 mg | Freq: Once | INTRAVENOUS | Status: AC
  Administered 2017-09-20: 10 mg via INTRAVENOUS
  Filled 2017-09-20: qty 5

## 2017-09-20 NOTE — Telephone Encounter (Signed)
Pt reports rectal bleeding since Friday evening. States "dripping everywhere" after BM Friday, now with each BM, "Loose" stools, "I have a BM each time I go to the bathroom." States bright red blood, no clots;  "a lot" in commode each time. H/O hemorrhoidectomy 2 years ago, "Never had any problems since."  Pt is on Coumadin 5 mg; did not take today. Denies any dizziness. States "maybe a little weaker." Pt directed to ED, states will follow disposition. Care advise given per protocol.  Reason for Disposition . Taking Coumadin (warfarin) or other strong blood thinner, or known bleeding disorder (e.g., thrombocytopenia)  Answer Assessment - Initial Assessment Questions 1. APPEARANCE of BLOOD: "What color is it?" "Is it passed separately, on the surface of the stool, or mixed in with the stool?"      Bright red, mixed in and in water 2. AMOUNT: "How much blood was passed?"      A lot 3. FREQUENCY: "How many times has blood been passed with the stools?"       4. ONSET: "When was the blood first seen in the stools?" (Days or weeks)      Friday night 5. DIARRHEA: "Is there also some diarrhea?" If so, ask: "How many diarrhea stools were passed in past 24 hours?"      Loose stools 6. CONSTIPATION: "Do you have constipation?" If so, "How bad is it?"     no 7. RECURRENT SYMPTOMS: "Have you had blood in your stools before?" If so, ask: "When was the last time?" and "What happened that time?"      Hemorrhoidectomy 2 years ago 8. BLOOD THINNERS: "Do you take any blood thinners?" (e.g., Coumadin/warfarin, Pradaxa/dabigatran, aspirin)     Coumadin 9. OTHER SYMPTOMS: "Do you have any other symptoms?"  (e.g., abdominal pain, vomiting, dizziness, fever)     no  Protocols used: RECTAL BLEEDING-A-AH

## 2017-09-20 NOTE — ED Provider Notes (Signed)
[facility] Emergency Department Provider Note       Time seen: ----------------------------------------- 10:07 AM on 09/20/2017 -----------------------------------------   I have reviewed the triage vital signs and the nursing notes.  HISTORY   Chief Complaint GI Bleeding    HPI [name] is a 84 y.o. female with a history of chronic atrial fibrillation on Coumadin, carotid stenosis, heart failure, hyperlipidemia who presents to the ED for GI bleeding.  Patient states she been passing bright red blood per rectum that has been painless since Saturday.  Patient has had at least 3 episodes a day since then.  She has had one episode this morning.  She did not take her Coumadin today.  She does report a history of hemorrhoids but states this bleeding is worse than any bleeding she has had with hemorrhoids in the past.  She denies recent illness or other complaints.  Past Medical History:  Diagnosis Date  . Allergic rhinitis   . 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 Arthritis   . Atrial fibrillation -persistent cardiologist-  dr klein/  primary EP -- [facility]aw[facility] Sat ([facility])   a. s/p PVI [name] 2010;  b. on tikosyn/coumadin;  c. 05/2009 Echo: EF 60-65%, Gr 2 DD. (first dx 09/ 2007)  . Bilateral lower extremity edema   . Bleeding hemorrhoid   . Carotid stenosis    mild (hosp 3/11)- consult by vasc/ Dr Donnetta Hutching  . Complication of anesthesia    hard to wake  . Depression    denies  . Diverticulosis of colon   . Dyspnea    on exertion-climbing stairs  . Dysrhythmia   . Fatty liver   . H/O cardiac radiofrequency ablation    01/ 2008 at [facility] [place] /  03/ 2010  at [facility]  . Heart failure with preserved ejection fraction ([place])   . Heart murmur    "prior to valve repair"  . History of adenomatous polyp of colon    tubular adenoma's  . History of cardiomyopathy    secondary tachycardia-induced cardiomyopathy -- resolved 2014  . History of squamous cell  carcinoma in situ (SCCIS) of skin    05/ 2017  nasal bridge and right medial knee  . History of transient ischemic attack (TIA)    01-24-2005 and 06-12-2009  . Hyperlipidemia   . Hypothyroidism   . Mild intermittent asthma    reacts to cats  . Mixed stress and urge urinary incontinence   . Pulmonary nodule   . S/P mitral valve repair 10-23-1998  dr Boyce Medici at [facility]   for MVP and regurg. (annuloplasty ring procedure)  . Swelling of left extremity 2017   states it's gotten worse  . Varicose vein of leg   . Wears glasses     Patient Active Problem List   Diagnosis Date Noted  . Rapid atrial fibrillation ([place]) 08/19/2017  . Constipation 08/02/2017  . Unilateral primary osteoarthritis, left hip 05/04/2017  . Status post total replacement of left hip 05/04/2017  . Hip osteoarthritis 04/27/2017  . Long term (current) use of anticoagulants 03/04/2017  . Venous stasis dermatitis of both lower extremities 01/08/2017  . Impacted cerumen of right ear 11/20/2016  . Osteopenia 10/25/2016  . Pedal edema 08/26/2016  . Varicose veins of both lower extremities 08/26/2016  . Estrogen deficiency 08/26/2016  . Screening mammogram, encounter for 08/26/2016  . Hemorrhoids 08/26/2016  . Epistaxis 01/07/2016  . Urticaria 08/22/2014  . Hip pain 08/02/2014  . Left  knee pain 08/02/2014  . Chronic cough 05/08/2014  . Caregiver stress 08/16/2013  . Colon cancer screening 08/16/2013  . Encounter for therapeutic drug monitoring 04/20/2013  . Left ovarian cyst 03/14/2013  . (HFpEF) heart failure with preserved ejection fraction ([place]) 12/27/2012  . Cardiomyopathy, secondary --Resolved again 10/14 10/13/2010  . COLONIC POLYPS, ADENOMATOUS, HX OF 09/18/2009  . PULMONARY NODULE 12/20/2008  . GANGLION CYST 10/04/2007  . Hyperlipidemia 04/27/2007  . DEPRESSION 04/27/2007  . Asthma, mild intermittent 04/27/2007  . INSOMNIA 04/27/2007  . ADENOMATOUS COLONIC POLYP 11/04/2006  . Hypothyroidism  09/02/2006  . Atrial fibrillation ([place]) 08/05/2006    Past Surgical History:  Procedure Laterality Date  . APPENDECTOMY  1978  . CARDIAC ELECTROPHYSIOLOGY [place] AND ABLATION  01/ 2008    at [place]   right-sided ablation atrial flutter  . CARDIAC ELECTROPHYSIOLOGY STUDY AND ABLATION  03/ 2010   dr Jaymes Graff at [facility]   AV node ablation and pulmonary vein isolation for atrial fib  . CARDIOVERSION  06-18-2006;  07-13-2006;  10-19-2010;  10-27-2010  . COLONOSCOPY    . CYSTO/ TRANSURETHRAL COLLAGEN INJECTION THERAPY  07-26-2007   dr Matilde Sprang  . DILATION AND CURETTAGE OF UTERUS    . EXCISIONAL HEMORRHOIDECTOMY  1980s  . HEMORRHOID SURGERY N/A 10/29/2016   Procedure: HEMORRHOIDECTOMY;  Surgeon: Leighton Ruff, MD;  Location: [facility];  Service: General;  Laterality: N/A;  . MITRAL VALVE ANNULOPLASTY  10/23/1998   "Model 4625; Campbell Lerner [id number]"; size 41mm; [facility]; Dr. Boyce Medici  . PILONIDAL CYST EXCISION  1954  . TEE WITH CARDIOVERSION  05-06-2006 at [facility];  01-02-2013 at [facility]  . TOTAL HIP ARTHROPLASTY Left 05/04/2017   Procedure: LEFT TOTAL HIP ARTHROPLASTY ANTERIOR APPROACH;  Surgeon: Mcarthur Rossetti, MD;  Location: [place];  Service: Orthopedics;  Laterality: Left;  . TRANSTHORACIC ECHOCARDIOGRAM  05-01-2015   dr Caryl Comes   ef 50-55%/  mild AV sclerosis without stenosis/  post MV repair with mild central MR (valve area by pressure half-time 2cm^2,  valve area by continutity equation 0.91cm^2, peak grandiant 60mmHg)/  severe LAE/ mild TR/ mild RAE   . TUBAL LIGATION Bilateral 1978    Allergies Amiodarone hcl; Penicillins; and Statins  Social History Social History   Tobacco Use  . Smoking status: Never Smoker  . Smokeless tobacco: Never Used  Substance Use Topics  . Alcohol use: Yes    Alcohol/week: 0.0 oz    Comment: seldom  . Drug use: No   Review of Systems Constitutional: Negative for fever. Cardiovascular: Negative for chest  pain. Respiratory: Negative for shortness of breath. Gastrointestinal: Negative for abdominal pain, vomiting and diarrhea.  Positive for rectal bleeding Musculoskeletal: Negative for back pain. Skin: Negative for rash. Neurological: Negative for headaches, focal weakness or numbness.  All systems negative/normal/unremarkable except as stated in the HPI  ____________________________________________   PHYSICAL EXAM:  VITAL SIGNS: ED Triage Vitals [09/20/17 0953]  Enc Vitals Group     BP 114/78     Pulse Rate (!) 108     Resp 16     Temp 98.2 F (36.8 C)     Temp src      SpO2 98 %     Weight 175 lb (79.4 kg)     Height 5\' 5"  (1.651 m)     Head Circumference      Peak Flow      Pain Score 0     Pain Loc      Pain Edu?  Excl. in [place]?    Constitutional: Alert and oriented. Well appearing and in no distress. Eyes: Conjunctivae are normal. Normal extraocular movements. ENT   Head: Normocephalic and atraumatic.   Nose: No congestion/rhinnorhea.   Mouth/Throat: Mucous membranes are moist.   Neck: No stridor. Cardiovascular: Irregularly irregular rhythm. No murmurs, rubs, or gallops. Respiratory: Normal respiratory effort without tachypnea nor retractions. Breath sounds are clear and equal bilaterally. No wheezes/rales/rhonchi. Gastrointestinal: Soft and nontender. Normal bowel sounds Rectal: No gross blood or active bleeding, recent blood is noted Musculoskeletal: Nontender with normal range of motion in extremities. No lower extremity tenderness nor edema. Neurologic:  Normal speech and language. No gross focal neurologic deficits are appreciated.  Skin:  Skin is warm, dry and intact. No rash noted. Psychiatric: Mood and affect are normal. Speech and behavior are normal.  ____________________________________________  EKG: Interpreted by me.  Atrial fibrillation with a rapid ventricular response, rate is 141 bpm, leftward axis, normal  QT.  ____________________________________________  ED COURSE:  As part of my medical decision making, I reviewed the following data within the [place] History obtained from family if available, nursing notes, old chart and ekg, as well as notes from prior ED visits. Patient presented for bright red rectal bleeding, we will assess with labs and imaging as indicated at this time.   Procedures ____________________________________________   LABS (pertinent positives/negatives)  Labs Reviewed  CBC WITH DIFFERENTIAL/PLATELET - Abnormal; Notable for the following components:      Result Value   Hemoglobin 11.3 (*)    HCT 33.6 (*)    MCV 77.4 (*)    MCH 25.9 (*)    RDW 16.1 (*)    All other components within normal limits  COMPREHENSIVE METABOLIC PANEL - Abnormal; Notable for the following components:   Glucose, Bld 121 (*)    Calcium 8.6 (*)    All other components within normal limits  PROTIME-INR - Abnormal; Notable for the following components:   Prothrombin Time 20.7 (*)    All other components within normal limits  TROPONIN I   ____________________________________________  DIFFERENTIAL DIAGNOSIS   Diverticulosis, hemorrhoidal bleeding, anemia, coagulopathy  FINAL ASSESSMENT AND PLAN  Rectal bleeding   Plan: The patient had presented for rectal bleeding on Coumadin. Patient's labs do indicate very mild anemia.  And slightly elevated INR from her Coumadin.  We will have her continue to hold the Coumadin.  I did discuss with gastroenterology because the patient does not want to stay in the hospital.  She has not had any further bleeding but it has been advised if she has any bleeding that is worsening or she feels ill in any way she will certainly need admission into the hospital.  She will follow-up with Dr. Vicente Males in the next 24 hours for repeat evaluation and then scheduled colonoscopy.   Laurence Aly, MD   Note: This note was generated in  part or whole with voice recognition software. Voice recognition is usually quite accurate but there are transcription errors that can and very often do occur. I apologize for any typographical errors that were not detected and corrected.     Earleen Newport, MD 09/20/17 1251

## 2017-09-20 NOTE — ED Notes (Signed)
First Nurse Note: Patient complaining of rectal bleeding with bowel movements, "bright red" blood X 48 hrs.  Did not take her Warfarin this AM because of concerns about bleeding.

## 2017-09-20 NOTE — Telephone Encounter (Signed)
Aware-will watch for notes Thanks

## 2017-09-20 NOTE — ED Triage Notes (Signed)
Bright red blood only with BM since Saturday. Pt hx of hemorrhoids. Takes warfarin, did not take dose this AM. Pt has normal PO intake. Denies abdominal pain. Pt alert and oriented X4, active, cooperative, pt in NAD. RR even and unlabored, color WNL.

## 2017-09-20 NOTE — Telephone Encounter (Signed)
I spoke with pt and she is on the way now to [facility] ED. FYI to Dr Glori Bickers.

## 2017-09-29 ENCOUNTER — Ambulatory Visit (INDEPENDENT_AMBULATORY_CARE_PROVIDER_SITE_OTHER): Payer: Medicare Other | Admitting: Gastroenterology

## 2017-09-29 ENCOUNTER — Other Ambulatory Visit: Payer: Self-pay

## 2017-09-29 ENCOUNTER — Encounter: Payer: Self-pay | Admitting: Gastroenterology

## 2017-09-29 VITALS — BP 114/58 | HR 81 | Ht 64.0 in | Wt 176.8 lb

## 2017-09-29 DIAGNOSIS — K625 Hemorrhage of anus and rectum: Secondary | ICD-10-CM | POA: Diagnosis not present

## 2017-09-29 DIAGNOSIS — D509 Iron deficiency anemia, unspecified: Secondary | ICD-10-CM

## 2017-09-29 DIAGNOSIS — K5900 Constipation, unspecified: Secondary | ICD-10-CM | POA: Diagnosis not present

## 2017-09-29 DIAGNOSIS — D649 Anemia, unspecified: Secondary | ICD-10-CM

## 2017-09-29 DIAGNOSIS — D62 Acute posthemorrhagic anemia: Secondary | ICD-10-CM

## 2017-09-29 NOTE — Progress Notes (Signed)
[name] MD, MRCP(U.K) [address]  [place]  [place], [place] [address]  Main: [phone]  Fax: [phone]   Gastroenterology Consultation  Referring Provider:     Abner Greenspan, MD Primary Care Physician:  [name], [name] Fanny, MD Primary Gastroenterologist:  Dr. [name]  Reason for Consultation:     Rectal bleeding         HPI:   [name] is a 84 y.o. y/o female referred for consultation & management  by Dr. Glori Bickers, [name] Fanny, MD.    She has been referred from the ER for rectal bleeding. She has a history of A fib on coumadin. She presented to the ER on 09/20/17 with painless rectal bleeding. She was stable in the ER and hence was discharged to follow up in my office.   At the ER when she had her labs checked - INR 1.80 . Hb 11.3 with MCV 77, looking back her MCV has gradually decreased over the past 2 months . Last colonoscopy was back in 2016 and two tubular adenomas were excised.   She says that she started having bright red blood "lots of it" per rectum a few days prior to her ER visit, spoke to her doctor and was seen by the ER. At the ER she says stopped suddenly. She says she had a hip replacement in 04/2017 - was doing well but has noticed a change in bowel habits without being on any opiods. Says the stool is hard and small. Using a lot of suppositories to have a bowel movement . Taking a fiber supplement. She used to have a BM previously after every meal . Presently not had a good one for a few months.   No change in weight . Appetite is not as good as what it was previously. No nasal bleeds. No vaginal bleed. No blood in her urine .     Past Medical History:  Diagnosis Date  . Allergic rhinitis   . 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 Arthritis   . Atrial fibrillation -persistent cardiologist-  dr klein/  primary EP -- [facility] Tawanna Sat ([facility])   a. s/p PVI [name] 2010;  b. on tikosyn/coumadin;  c. 05/2009 Echo: EF 60-65%, Gr 2 DD. (first dx 09/ 2007)  .  Bilateral lower extremity edema   . Bleeding hemorrhoid   . Carotid stenosis    mild (hosp 3/11)- consult by vasc/ Dr Donnetta Hutching  . Complication of anesthesia    hard to wake  . Depression    denies  . Diverticulosis of colon   . Dyspnea    on exertion-climbing stairs  . Dysrhythmia   . Fatty liver   . H/O cardiac radiofrequency ablation    01/ 2008 at [facility] [place] [facility] [place] /  03/ 2010  at [facility]  . Heart failure with preserved ejection fraction ([place])   . Heart murmur    "prior to valve repair"  . History of adenomatous polyp of colon    tubular adenoma's  . History of cardiomyopathy    secondary tachycardia-induced cardiomyopathy -- resolved 2014  . History of squamous cell carcinoma in situ (SCCIS) of skin    05/ 2017  nasal bridge and right medial knee  . History of transient ischemic attack (TIA)    01-24-2005 and 06-12-2009  . Hyperlipidemia   . Hypothyroidism   . Mild intermittent asthma    reacts to cats  . Mixed stress and urge urinary incontinence   .  Pulmonary nodule   . S/P mitral valve repair 10-23-1998  dr Boyce Medici at [facility]   for MVP and regurg. (annuloplasty ring procedure)  . Swelling of left extremity 2017   states it's gotten worse  . Varicose vein of leg   . Wears glasses     Past Surgical History:  Procedure Laterality Date  . APPENDECTOMY  1978  . CARDIAC ELECTROPHYSIOLOGY [place] AND ABLATION  01/ 2008    at [place]   right-sided ablation atrial flutter  . CARDIAC ELECTROPHYSIOLOGY STUDY AND ABLATION  03/ 2010   dr Jaymes Graff at [facility]   AV node ablation and pulmonary vein isolation for atrial fib  . CARDIOVERSION  06-18-2006;  07-13-2006;  10-19-2010;  10-27-2010  . COLONOSCOPY    . CYSTO/ TRANSURETHRAL COLLAGEN INJECTION THERAPY  07-26-2007   dr Matilde Sprang  . DILATION AND CURETTAGE OF UTERUS    . EXCISIONAL HEMORRHOIDECTOMY  1980s  . HEMORRHOID SURGERY N/A 10/29/2016   Procedure: HEMORRHOIDECTOMY;  Surgeon: Leighton Ruff, MD;   Location: [facility];  Service: General;  Laterality: N/A;  . MITRAL VALVE ANNULOPLASTY  10/23/1998   "Model 4625; Campbell Lerner [id number]"; size 89mm; [facility]; Dr. Boyce Medici  . PILONIDAL CYST EXCISION  1954  . TEE WITH CARDIOVERSION  05-06-2006 at [facility];  01-02-2013 at [facility]  . TOTAL HIP ARTHROPLASTY Left 05/04/2017   Procedure: LEFT TOTAL HIP ARTHROPLASTY ANTERIOR APPROACH;  Surgeon: Mcarthur Rossetti, MD;  Location: [facility];  Service: Orthopedics;  Laterality: Left;  . TRANSTHORACIC ECHOCARDIOGRAM  05-01-2015   dr Caryl Comes   ef 50-55%/  mild AV sclerosis without stenosis/  post MV repair with mild central MR (valve area by pressure half-time 2cm^2,  valve area by continutity equation 0.91cm^2, peak grandiant 23mmHg)/  severe LAE/ mild TR/ mild RAE   . TUBAL LIGATION Bilateral 1978    Prior to Admission medications   Medication Sig Start Date End Date Taking? Authorizing Provider  albuterol (PROVENTIL HFA;VENTOLIN HFA) 108 (90 Base) MCG/ACT inhaler Inhale 2 puffs into the lungs every 4 (four) hours as needed for wheezing or shortness of breath. 06/23/16  Yes [name], [name] Fanny, MD  alendronate (FOSAMAX) 70 MG tablet Take 1 tablet (70 mg total) by mouth every 7 (seven) days. Take with a full glass of water on an empty stomach. 09/01/17  Yes [name], [name] Fanny, MD  Cholecalciferol (VITAMIN D3) 2000 units TABS Take 4,000 Units by mouth daily.   Yes [provider]  clindamycin (CLEOCIN) 150 MG capsule TAKE 4 CAPSULES BY MOUTH 1 HR PRIOR TO DENTAL APPT 09/06/17  Yes [provider]  diltiazem (CARTIA XT) 180 MG 24 hr capsule Take 1 capsule (180 mg total) by mouth daily. 06/15/17  Yes Deboraha Sprang, MD  diphenhydrAMINE (BENADRYL) 25 mg capsule Take 50 mg by mouth every 6 (six) hours as needed for itching.   Yes [provider]  fluticasone (FLONASE) 50 MCG/ACT nasal spray Place 1 spray into both nostrils daily as needed for allergies.   Yes [provider]    furosemide (LASIX) 40 MG tablet TAKE 1/2 TABLET BY MOUTH DAILY, MAY INCREASE TO 1 TABLET BY MOUTH DAILY IF NEEDED Patient taking differently: Take 20 mg by mouth daily. 08/19/16  Yes Deboraha Sprang, MD  levothyroxine (SYNTHROID, LEVOTHROID) 25 MCG tablet TAKE 1 TABLET EVERY DAY BEFORE BREAKFAST 05/24/17  Yes Deboraha Sprang, MD  loratadine (CLARITIN) 10 MG tablet Take 10 mg by mouth daily as needed for allergies.  Yes [provider]  metoprolol tartrate (LOPRESSOR) 25 MG tablet TAKE 1 TABLET (25 MG TOTAL) BY MOUTH 3 (THREE) TIMES DAILY AS NEEDED. FOR FAST HEART RATE Patient taking differently: Take 25 mg by mouth daily as needed (tachycardia).  09/06/17  Yes [name], [name] Fanny, MD  Polyethyl Glycol-Propyl Glycol (LUBRICANT EYE DROPS) 0.4-0.3 % SOLN Place 1-2 drops into both eyes 3 (three) times daily as needed (for dry eyes.).   Yes [provider]  warfarin (COUMADIN) 5 MG tablet TAKE AS DIRECTED BY ANTI COAGULATION CLINIC Patient taking differently: Take 2.5-5 mg by mouth See admin instructions. TAKE 0.5 TABLET (2.5 MG) ON Monday, Wednesday, AND Friday. TAKE 1 TABLET (5 MG) ON ALL OTHER DAYS 02/03/17  Yes [name], [name] Fanny, MD    Family History  Problem Relation Age of Onset  . Hypertension Mother   . Lung cancer Father        smoker  . Alcohol abuse Father   . Cancer Father        bladder and lung CA smoker  . Breast cancer Neg Hx      Social History   Tobacco Use  . Smoking status: Never Smoker  . Smokeless tobacco: Never Used  Substance Use Topics  . Alcohol use: Yes    Alcohol/week: 0.0 oz    Comment: seldom  . Drug use: No    Allergies as of 09/29/2017 - Review Complete 09/29/2017  Allergen Reaction Noted  . Amiodarone hcl Swelling   . Penicillins Rash 04/27/2007  . Statins Rash     Review of Systems:    All systems reviewed and negative except where noted in HPI.   Physical Exam:  BP (!) 114/58   Pulse 81   Ht 5\' 4"  (1.626 m)   Wt 176 lb 12.8 oz  (80.2 kg)   BMI 30.35 kg/m  No LMP recorded. Patient is postmenopausal. Psych:  Alert and cooperative. Normal mood and affect. General:   Alert,  Well-developed, well-nourished, pleasant and cooperative in NAD Head:  Normocephalic and atraumatic. Eyes:  Sclera clear, no icterus.   Conjunctiva pink. Ears:  Normal auditory acuity. Nose:  No deformity, discharge, or lesions. Mouth:  No deformity or lesions,oropharynx pink & moist. Neck:  Supple; no masses or thyromegaly. Lungs:  Respirations even and unlabored.  Clear throughout to auscultation.   No wheezes, crackles, or rhonchi. No acute distress. Heart:  Irregular Regular rate and rhythm; no murmurs, clicks, rubs, or gallops. Abdomen:  Normal bowel sounds.  No bruits.  Soft, non-tender and non-distended without masses, hepatosplenomegaly or hernias noted.  No guarding or rebound tenderness.    Neurologic:  Alert and oriented x3;  grossly normal neurologically. Skin:  Intact without significant lesions or rashes. No jaundice. Lymph Nodes:  No significant cervical adenopathy. Psych:  Alert and cooperative. Normal mood and affect.  Imaging Studies: No results found.  Assessment and Plan:   MAREE AINLEY is a 84 y.o. y/o female has been referred for rectal bleeding. Review of her labs suggests she has a new onset microcytic anemia. Change in bowel habits since 04/2017   Plan  1. Check iron studies, b12,folate and if low will need supplementation probably parenteral as she already suffers from severe constipation  2. EGD+colonoscopy +/- capsule study of the small bowel  3. Linzess 72 mcg for constipation- 2 weeks samples provided   I have discussed alternative options, risks & benefits,  which include, but are not limited to, bleeding, infection, perforation,respiratory complication &  drug reaction.  The patient agrees with this plan & written consent will be obtained.     Follow up in 8 weeks   Dr [name] MD,MRCP(U.K)

## 2017-09-30 ENCOUNTER — Telehealth: Payer: Self-pay

## 2017-09-30 NOTE — Telephone Encounter (Signed)
LVM for pt on cell phone to reschedule her Colon and EGD-Dr. Vicente Males will not be in on the 18th.  Waiting call back.  Thanks [employer]

## 2017-10-01 ENCOUNTER — Other Ambulatory Visit: Payer: Self-pay | Admitting: General Practice

## 2017-10-01 ENCOUNTER — Other Ambulatory Visit: Payer: Self-pay | Admitting: Family Medicine

## 2017-10-01 ENCOUNTER — Encounter: Payer: Self-pay | Admitting: *Deleted

## 2017-10-01 ENCOUNTER — Encounter: Payer: Self-pay | Admitting: Family Medicine

## 2017-10-01 MED ORDER — WARFARIN SODIUM 5 MG PO TABS: ORAL_TABLET | ORAL | 1 refills | Status: DC

## 2017-10-07 ENCOUNTER — Ambulatory Visit (INDEPENDENT_AMBULATORY_CARE_PROVIDER_SITE_OTHER): Payer: Medicare Other | Admitting: General Practice

## 2017-10-07 DIAGNOSIS — I4891 Unspecified atrial fibrillation: Secondary | ICD-10-CM

## 2017-10-07 DIAGNOSIS — Z7901 Long term (current) use of anticoagulants: Secondary | ICD-10-CM

## 2017-10-07 LAB — POCT INR: INR: 2.1 (ref 2.0–3.0)

## 2017-10-07 NOTE — Patient Instructions (Addendum)
Pre visit review using our clinic review tool, if applicable. No additional management support is needed unless otherwise documented below in the visit note.  Continue to take 1 pill (5mg ) daily EXCEPT for 1/2 pill (2.5mg ) on Mondays, Wednesdays and Fridays.   Re-check in 4 weeks.  7/19 - Take last dose of coumadin until after procedure 7/20 - Nothing 7/21 - Lovenox 7/22 - Lovenox  7/23 -  Procedure (No Lovenox) 7/24 -  Lovenox and 1 1/2 tablets of coumadin 7/25 -  Lovenox and 1 1/2 tablets of coumadin 7/26 -  Lovenox and 1 tablet of coumadin 7/27 -  Lovenox and 1 tablet of coumadin 7/28 - Stop Lovenox and continue to take 1 tablet daily except 1/2 tablet on Mon/Wed/Friday.

## 2017-10-08 ENCOUNTER — Telehealth: Payer: Self-pay | Admitting: General Practice

## 2017-10-08 ENCOUNTER — Other Ambulatory Visit: Payer: Self-pay | Admitting: General Practice

## 2017-10-08 MED ORDER — ENOXAPARIN SODIUM 120 MG/0.8ML ~~LOC~~ SOLN: 120.0000 mg | SUBCUTANEOUS | 0 refills | Status: DC

## 2017-10-08 NOTE — Telephone Encounter (Signed)
-----   Message from Abner Greenspan, MD sent at 10/07/2017  8:48 PM EDT ----- Regarding: RE: Lovenox bridging That is great, thanks ! ----- Message ----- From: Warden Fillers, RN Sent: 10/07/2017   3:32 PM To: Abner Greenspan, MD Subject: Lovenox bridging                               Hi Dr. Glori Bickers,  Patient informed me today that she is having a GI series on Tuesday. She stated that Cardiology wanted her to hold coumadin for 3 days. It looks like she had A-fib with RVR back in June so I wanted to check with you about a lovenox bridge for patient.  Below is my recommendation for your review.  Please advise.  7/19 - Take last dose of coumadin until after procedure 7/20 - Nothing 7/21 - Lovenox 7/22 - Lovenox  7/23 -  Procedure (No Lovenox) 7/24 -  Lovenox and 1 1/2 tablets of coumadin 7/25 -  Lovenox and 1 1/2 tablets of coumadin 7/26 -  Lovenox and 1 tablet of coumadin 7/27 -  Lovenox and 1 tablet of coumadin 7/28 - Stop Lovenox and continue to take 1 tablet daily except 1/2 tablet on Mon/Wed/Friday.  Thanks, Villa Herb, RN

## 2017-10-11 ENCOUNTER — Encounter: Payer: Self-pay | Admitting: *Deleted

## 2017-10-12 ENCOUNTER — Encounter: Payer: Self-pay | Admitting: Emergency Medicine

## 2017-10-12 ENCOUNTER — Encounter
Admission: RE | Disposition: A | Discharge status: Home / Self Care | Payer: Self-pay | Source: Ambulatory Visit | Attending: Gastroenterology

## 2017-10-12 ENCOUNTER — Encounter: Payer: Self-pay | Admitting: Anesthesiology

## 2017-10-12 ENCOUNTER — Ambulatory Visit
Admission: RE | Admit: 2017-10-12 | Discharge: 2017-10-12 | Disposition: A | Discharge status: Home / Self Care | Payer: Medicare Other | Source: Ambulatory Visit | Attending: Gastroenterology | Admitting: Gastroenterology

## 2017-10-12 ENCOUNTER — Ambulatory Visit: Payer: Medicare Other | Admitting: Anesthesiology

## 2017-10-12 SURGERY — ESOPHAGOGASTRODUODENOSCOPY (EGD) WITH PROPOFOL
Anesthesia: General

## 2017-10-12 NOTE — OR Nursing (Signed)
States she only took 1/2 prep. States results are orange liquid with "fecal" pieces. Dr. Vicente Males notified. Spoke with patient. Procedure rescheduled until tomorrow. Instructions given per Dr. Vicente Males by Chapman Moss. Discharged to home via ambulation.

## 2017-10-12 NOTE — Anesthesia Preprocedure Evaluation (Deleted)
Anesthesia Evaluation  Patient identified by MRN, date of birth, ID band Patient awake    Reviewed: Allergy & Precautions, NPO status , Patient's Chart, lab work & pertinent test results, reviewed documented beta blocker date and time   Airway Mallampati: II  TM Distance: >3 FB     Dental  (+) Chipped   Pulmonary shortness of breath, asthma ,           Cardiovascular + Peripheral Vascular Disease  + dysrhythmias Atrial Fibrillation + Valvular Problems/Murmurs      Neuro/Psych PSYCHIATRIC DISORDERS Depression TIA   GI/Hepatic   Endo/Other  Hypothyroidism   Renal/GU      Musculoskeletal  (+) Arthritis ,   Abdominal   Peds  Hematology   Anesthesia Other Findings MV repair. Ablation.  Reproductive/Obstetrics                             Anesthesia Physical Anesthesia Plan  ASA: III  Anesthesia Plan: General   Post-op Pain Management:    Induction: Intravenous  PONV Risk Score and Plan:   Airway Management Planned:   Additional Equipment:   Intra-op Plan:   Post-operative Plan:   Informed Consent: I have reviewed the patients History and Physical, chart, labs and discussed the procedure including the risks, benefits and alternatives for the proposed anesthesia with the patient or authorized representative who has indicated his/her understanding and acceptance.     Plan Discussed with: CRNA  Anesthesia Plan Comments:         Anesthesia Quick Evaluation

## 2017-10-13 ENCOUNTER — Ambulatory Visit: Payer: Medicare Other | Admitting: Anesthesiology

## 2017-10-13 ENCOUNTER — Encounter
Admission: RE | Disposition: A | Discharge status: Home / Self Care | Payer: Self-pay | Source: Ambulatory Visit | Attending: Gastroenterology

## 2017-10-13 ENCOUNTER — Ambulatory Visit
Admission: RE | Admit: 2017-10-13 | Discharge: 2017-10-13 | Disposition: A | Discharge status: Home / Self Care | Payer: Medicare Other | Source: Ambulatory Visit | Attending: Gastroenterology | Admitting: Gastroenterology

## 2017-10-13 DIAGNOSIS — M1990 Unspecified osteoarthritis, unspecified site: Secondary | ICD-10-CM | POA: Insufficient documentation

## 2017-10-13 DIAGNOSIS — K760 Fatty (change of) liver, not elsewhere classified: Secondary | ICD-10-CM | POA: Diagnosis not present

## 2017-10-13 DIAGNOSIS — I6529 Occlusion and stenosis of unspecified carotid artery: Secondary | ICD-10-CM | POA: Insufficient documentation

## 2017-10-13 DIAGNOSIS — K449 Diaphragmatic hernia without obstruction or gangrene: Secondary | ICD-10-CM | POA: Diagnosis not present

## 2017-10-13 DIAGNOSIS — D649 Anemia, unspecified: Secondary | ICD-10-CM | POA: Diagnosis not present

## 2017-10-13 DIAGNOSIS — Z8673 Personal history of transient ischemic attack (TIA), and cerebral infarction without residual deficits: Secondary | ICD-10-CM | POA: Diagnosis not present

## 2017-10-13 DIAGNOSIS — J4520 Mild intermittent asthma, uncomplicated: Secondary | ICD-10-CM | POA: Diagnosis not present

## 2017-10-13 DIAGNOSIS — Z888 Allergy status to other drugs, medicaments and biological substances status: Secondary | ICD-10-CM | POA: Diagnosis not present

## 2017-10-13 DIAGNOSIS — D123 Benign neoplasm of transverse colon: Secondary | ICD-10-CM | POA: Diagnosis not present

## 2017-10-13 DIAGNOSIS — J45909 Unspecified asthma, uncomplicated: Secondary | ICD-10-CM | POA: Insufficient documentation

## 2017-10-13 DIAGNOSIS — Z79899 Other long term (current) drug therapy: Secondary | ICD-10-CM | POA: Insufficient documentation

## 2017-10-13 DIAGNOSIS — K3189 Other diseases of stomach and duodenum: Secondary | ICD-10-CM | POA: Insufficient documentation

## 2017-10-13 DIAGNOSIS — Z880 Allergy status to penicillin: Secondary | ICD-10-CM | POA: Diagnosis not present

## 2017-10-13 DIAGNOSIS — E785 Hyperlipidemia, unspecified: Secondary | ICD-10-CM | POA: Diagnosis not present

## 2017-10-13 DIAGNOSIS — I509 Heart failure, unspecified: Secondary | ICD-10-CM | POA: Diagnosis not present

## 2017-10-13 DIAGNOSIS — Z85828 Personal history of other malignant neoplasm of skin: Secondary | ICD-10-CM | POA: Diagnosis not present

## 2017-10-13 DIAGNOSIS — E039 Hypothyroidism, unspecified: Secondary | ICD-10-CM | POA: Insufficient documentation

## 2017-10-13 DIAGNOSIS — I481 Persistent atrial fibrillation: Secondary | ICD-10-CM | POA: Insufficient documentation

## 2017-10-13 DIAGNOSIS — Z96642 Presence of left artificial hip joint: Secondary | ICD-10-CM | POA: Diagnosis not present

## 2017-10-13 DIAGNOSIS — D122 Benign neoplasm of ascending colon: Secondary | ICD-10-CM | POA: Diagnosis not present

## 2017-10-13 DIAGNOSIS — I739 Peripheral vascular disease, unspecified: Secondary | ICD-10-CM | POA: Diagnosis not present

## 2017-10-13 DIAGNOSIS — K635 Polyp of colon: Secondary | ICD-10-CM | POA: Diagnosis not present

## 2017-10-13 DIAGNOSIS — Z86010 Personal history of colonic polyps: Secondary | ICD-10-CM | POA: Insufficient documentation

## 2017-10-13 DIAGNOSIS — F329 Major depressive disorder, single episode, unspecified: Secondary | ICD-10-CM | POA: Diagnosis not present

## 2017-10-13 DIAGNOSIS — D509 Iron deficiency anemia, unspecified: Secondary | ICD-10-CM | POA: Insufficient documentation

## 2017-10-13 DIAGNOSIS — Z7901 Long term (current) use of anticoagulants: Secondary | ICD-10-CM | POA: Diagnosis not present

## 2017-10-13 HISTORY — PX: ESOPHAGOGASTRODUODENOSCOPY (EGD) WITH PROPOFOL: SHX5813

## 2017-10-13 HISTORY — PX: COLONOSCOPY WITH PROPOFOL: SHX5780

## 2017-10-13 SURGERY — COLONOSCOPY WITH PROPOFOL
Anesthesia: General

## 2017-10-13 MED ORDER — ONDANSETRON HCL 4 MG/2ML IJ SOLN: 4.0000 mg | Freq: Once | INTRAMUSCULAR | Status: DC | PRN

## 2017-10-13 MED ORDER — LIDOCAINE HCL (CARDIAC) PF 100 MG/5ML IV SOSY
PREFILLED_SYRINGE | INTRAVENOUS | Status: DC | PRN
  Administered 2017-10-13: 50 mg via INTRAVENOUS

## 2017-10-13 MED ORDER — SODIUM CHLORIDE 0.9 % IV SOLN
INTRAVENOUS | Status: DC
  Administered 2017-10-13: 1000 mL via INTRAVENOUS

## 2017-10-13 MED ORDER — PROPOFOL 500 MG/50ML IV EMUL
INTRAVENOUS | Status: AC
  Filled 2017-10-13: qty 50

## 2017-10-13 MED ORDER — PHENYLEPHRINE HCL 10 MG/ML IJ SOLN
INTRAMUSCULAR | Status: DC | PRN
  Administered 2017-10-13 (×2): 100 ug via INTRAVENOUS

## 2017-10-13 MED ORDER — GLYCOPYRROLATE 0.2 MG/ML IJ SOLN
INTRAMUSCULAR | Status: DC | PRN
  Administered 2017-10-13: 0.2 mg via INTRAVENOUS

## 2017-10-13 MED ORDER — PROPOFOL 10 MG/ML IV BOLUS
INTRAVENOUS | Status: AC
Start: 2017-10-13 — End: ?
  Filled 2017-10-13: qty 20

## 2017-10-13 MED ORDER — FENTANYL CITRATE (PF) 100 MCG/2ML IJ SOLN: 25.0000 ug | INTRAMUSCULAR | Status: DC | PRN

## 2017-10-13 MED ORDER — PROPOFOL 10 MG/ML IV BOLUS
INTRAVENOUS | Status: DC | PRN
  Administered 2017-10-13: 50 mg via INTRAVENOUS

## 2017-10-13 MED ORDER — PROPOFOL 500 MG/50ML IV EMUL
INTRAVENOUS | Status: DC | PRN
  Administered 2017-10-13: 150 ug/kg/min via INTRAVENOUS

## 2017-10-13 NOTE — Op Note (Signed)
[facility] Gastroenterology Patient Name: [name] Procedure Date: 10/13/2017 12:53 PM MRN: [id number] Account #: [id number] Date of Birth: 01-Apr-1932 Admit Type: Outpatient Age: 84 Room: [facility] ENDO ROOM 2 Gender: Female Note Status: Finalized Procedure:            Upper GI endoscopy Indications:          Iron deficiency anemia Providers:            [name] MD, MD Referring MD:         Wynelle Fanny. Tower (Referring MD) Medicines:            Monitored Anesthesia Care Complications:        No immediate complications. Procedure:            Pre-Anesthesia Assessment:                       - Prior to the procedure, a History and Physical was                        performed, and patient medications, allergies and                        sensitivities were reviewed. The patient's tolerance of                        previous anesthesia was reviewed.                       - The risks and benefits of the procedure and the                        sedation options and risks were discussed with the                        patient. All questions were answered and informed                        consent was obtained.                       - ASA Grade Assessment: III - A patient with severe                        systemic disease.                       After obtaining informed consent, the endoscope was                        passed under direct vision. Throughout the procedure,                        the patient's blood pressure, pulse, and oxygen                        saturations were monitored continuously. The Endoscope                        was introduced through the mouth, and advanced to the  third part of duodenum. The upper GI endoscopy was                        accomplished with ease. The patient tolerated the                        procedure well. Findings:      The esophagus was normal.      A 4 cm hiatal hernia was present.      The  examined duodenum was normal. Biopsies for histology were taken with       a cold forceps for evaluation of celiac disease.      The exam was otherwise without abnormality.      The cardia and gastric fundus were normal on retroflexion. Impression:           - Normal esophagus.                       - 4 cm hiatal hernia.                       - Normal examined duodenum. Biopsied.                       - The examination was otherwise normal. Recommendation:       - Await pathology results.                       - Perform a colonoscopy today. Procedure Code(s):    --- Professional ---                       [phone], Esophagogastroduodenoscopy, flexible, transoral;                        with biopsy, single or multiple Diagnosis Code(s):    --- Professional ---                       K44.9, Diaphragmatic hernia without obstruction or                        gangrene                       D50.9, Iron deficiency anemia, unspecified CPT copyright 2017 American Medical Association. All rights reserved. The codes documented in this report are preliminary and upon coder review may  be revised to meet current compliance requirements. [name], MD [name] MD, MD 10/13/2017 1:06:12 PM This report has been signed electronically. Number of Addenda: 0 Note Initiated On: 10/13/2017 12:53 PM      [facility]

## 2017-10-13 NOTE — Transfer of Care (Signed)
Immediate Anesthesia Transfer of Care Note  Patient: [name]  Procedure(s) Performed: COLONOSCOPY WITH PROPOFOL (N/A ) ESOPHAGOGASTRODUODENOSCOPY (EGD) WITH PROPOFOL (N/A )  Patient Location: PACU  Anesthesia Type:General  Level of Consciousness: sedated  Airway & Oxygen Therapy: Patient Spontanous Breathing and Patient connected to nasal cannula oxygen  Post-op Assessment: Report given to RN and Post -op Vital signs reviewed and stable  Post vital signs: Reviewed and stable  Last Vitals:  Vitals Value Taken Time  BP 104/92 10/13/2017  1:57 PM  Temp 36.1 C 10/13/2017  1:57 PM  Pulse 71 10/13/2017  1:58 PM  Resp 16 10/13/2017  1:58 PM  SpO2 94 % 10/13/2017  1:58 PM  Vitals shown include unvalidated device data.  Last Pain:  Vitals:   10/13/17 1357  TempSrc: Temporal  PainSc: Asleep         Complications: No apparent anesthesia complications

## 2017-10-13 NOTE — Op Note (Signed)
[facility] Gastroenter[facility] Patient Name: [name] Procedure Date: 10/13/2017 12:53 PM MRN: [id number] Account #: [id number] Date of Birth: 07/19/1932 Admit Type: Outpatient Age: 84 Room: [facility] ENDO ROOM 2 Gender: Female Note Status: Finalized Procedure:            Colonoscopy Indications:          Iron deficiency anemia Providers:            [name] MD, MD Referring MD:         Wynelle Fanny. Tower (Referring MD) Medicines:            Monitored Anesthesia Care Complications:        No immediate complications. Procedure:            Pre-Anesthesia Assessment:                       - Prior to the procedure, a History and Physical was                        performed, and patient medications, allergies and                        sensitivities were reviewed. The patient's tolerance of                        previous anesthesia was reviewed.                       - The risks and benefits of the procedure and the                        sedation options and risks were discussed with the                        patient. All questions were answered and informed                        consent was obtained.                       - ASA Grade Assessment: III - A patient with severe                        systemic disease.                       After obtaining informed consent, the colonoscope was                        passed under direct vision. Throughout the procedure,                        the patient's blood pressure, pulse, and oxygen                        saturations were monitored continuously. The                        Colonoscope was introduced through the anus and                        advanced  to the the cecum, identified by the                        appendiceal orifice, IC valve and transillumination.                        The colonoscopy was performed with ease. The patient                        tolerated the procedure well. The quality of the bowel                   preparation was fair. Findings:      The perianal and digital rectal examinations were normal.      [Number]12 sessile polyps were found in the transverse colon and       ascending colon. The polyps were 4 to 8 mm in size. These polyps were       removed with a cold snare. Resection and retrieval were complete.      One 10 mm submucosal nodule was found in the distal rectum. No biopsy       taken as itappeared submucosal. Close to anal verge      The exam was otherwise without abnormality. Impression:           - Preparation of the colon was fair.                       - [Number]12 4 to 8 mm polyps in the transverse colon                        and in the ascending colon, removed with a cold snare.                        Resected and retrieved.                       - Submucosal nodule in the distal rectum.                       - The examination was otherwise normal. Recommendation:       - Discharge patient to home (with escort).                       - Resume previous diet.                       - Continue present medications.                       - Resume Coumadin (warfarin) at prior dose today.                       - 1. capsule study of the samll bowel if iron studies                        show iron deficiency                       2. Repeat colonoscopy could be considered in 1 year due  to extensive number of polyps and less than adequate                        prep                       3. Refer to surgery for excision of polyp close to anus                       4. F/u with me in clinic in 4-6 weeks Procedure Code(s):    --- Professional ---                       [phone], Colonoscopy, flexible; with removal of tumor(s),                        polyp(s), or other lesion(s) by snare technique Diagnosis Code(s):    --- Professional ---                       D12.2, Benign neoplasm of ascending colon                       D12.3, Benign neoplasm  of transverse colon (hepatic                        flexure or splenic flexure)                       K62.89, Other specified diseases of anus and rectum                       D50.9, Iron deficiency anemia, unspecified CPT copyright 2017 American Medical Association. All rights reserved. The codes documented in this report are preliminary and upon coder review may  be revised to meet current compliance requirements. [name], MD [name] MD, MD 10/13/2017 1:56:22 PM This report has been signed electronically. Number of Addenda: 0 Note Initiated On: 10/13/2017 12:53 PM Scope Withdrawal Time: 0 hours 32 minutes 52 seconds  Total Procedure Duration: 0 hours 42 minutes 20 seconds       [facility]

## 2017-10-13 NOTE — H&P (Signed)
[name] Bellows, MD [address], [place], [place], [place], [address] [address], [place], [place], [place], [address] Phone: [phone]  Fax: [phone]  Primary Care Physician:  Tower, Wynelle Fanny, MD   Pre-Procedure History & Physical: HPI:  [name] is a 84 y.o. female is here for an endoscopy and colonoscopy    Past Medical History:  Diagnosis Date  . Allergic rhinitis   . 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 Arthritis   . Atrial fibrillation -persistent cardiologist-  dr klein/  primary EP -- dr Tawanna Sat ([facility])   a. s/p PVI [name] 2010;  b. on tikosyn/coumadin;  c. 05/2009 Echo: EF 60-65%, Gr 2 DD. (first dx 09/ 2007)  . Bilateral lower extremity edema   . Bleeding hemorrhoid   . Carotid stenosis    mild (hosp 3/11)- consult by vasc/ Dr Donnetta Hutching  . Complication of anesthesia    hard to wake  . Depression    denies  . Diverticulosis of colon   . Dyspnea    on exertion-climbing stairs  . Dysrhythmia   . Fatty liver   . H/O cardiac radiofrequency ablation    01/ 2008 at [facility] /  03/ 2010  at [facility]  . Heart failure with preserved ejection fraction ([place])   . Heart murmur    "prior to valve repair"  . History of adenomatous polyp of colon    tubular adenoma's  . History of cardiomyopathy    secondary tachycardia-induced cardiomyopathy -- resolved 2014  . History of squamous cell carcinoma in situ (SCCIS) of skin    05/ 2017  nasal bridge and right medial knee  . History of transient ischemic attack (TIA)    01-24-2005 and 06-12-2009  . Hyperlipidemia   . Hypothyroidism   . Mild intermittent asthma    reacts to cats  . Mixed stress and urge urinary incontinence   . Pulmonary nodule   . S/P mitral valve repair 10-23-1998  dr Boyce Medici at [facility]   for MVP and regurg. (annuloplasty ring procedure)  . Swelling of left extremity 2017   states it's gotten worse  . Varicose vein of leg   . Wears glasses     Past Surgical History:    Procedure Laterality Date  . APPENDECTOMY  1978  . CARDIAC ELECTROPHYSIOLOGY [place] AND ABLATION  01/ 2008    at [place]   right-sided ablation atrial flutter  . CARDIAC ELECTROPHYSIOLOGY STUDY AND ABLATION  03/ 2010   dr Jaymes Graff at [facility]   AV node ablation and pulmonary vein isolation for atrial fib  . CARDIOVERSION  06-18-2006;  07-13-2006;  10-19-2010;  10-27-2010  . COLONOSCOPY    . CYSTO/ TRANSURETHRAL COLLAGEN INJECTION THERAPY  07-26-2007   dr Matilde Sprang  . DILATION AND CURETTAGE OF UTERUS    . EXCISIONAL HEMORRHOIDECTOMY  1980s  . HEMORRHOID SURGERY N/A 10/29/2016   Procedure: HEMORRHOIDECTOMY;  Surgeon: Leighton Ruff, MD;  Location: [facility];  Service: General;  Laterality: N/A;  . MITRAL VALVE ANNULOPLASTY  10/23/1998   "Model 4625; [employer] [id number]"; size 45mm; [facility]; Dr. Boyce Medici  . PILONIDAL CYST EXCISION  1954  . TEE WITH CARDIOVERSION  05-06-2006 at [facility];  01-02-2013 at [facility]  . TOTAL HIP ARTHROPLASTY Left 05/04/2017   Procedure: LEFT TOTAL HIP ARTHROPLASTY ANTERIOR APPROACH;  Surgeon: Mcarthur Rossetti, MD;  Location: [place];  Service: Orthopedics;  Laterality: Left;  . TRANSTHORACIC ECHOCARDIOGRAM  05-01-2015  dr Eulas Post 50-55%/  mild AV sclerosis without stenosis/  post MV repair with mild central MR (valve area by pressure half-time 2cm^2,  valve area by continutity equation 0.91cm^2, peak grandiant 55mmHg)/  severe LAE/ mild TR/ mild RAE   . TUBAL LIGATION Bilateral 1978    Prior to Admission medications   Medication Sig Start Date End Date Taking? Authorizing Provider  diltiazem (CARTIA XT) 180 MG 24 hr capsule Take 1 capsule (180 mg total) by mouth daily. 06/15/17  Yes Deboraha Sprang, MD  enoxaparin (LOVENOX) 120 MG/0.8ML injection Inject 0.8 mLs (120 mg total) into the skin daily. 10/08/17  Yes Tower, Wynelle Fanny, MD  levothyroxine (SYNTHROID, LEVOTHROID) 25 MCG tablet TAKE 1 TABLET EVERY DAY BEFORE BREAKFAST 05/24/17  Yes  Deboraha Sprang, MD  albuterol (PROVENTIL HFA;VENTOLIN HFA) 108 (90 Base) MCG/ACT inhaler Inhale 2 puffs into the lungs every 4 (four) hours as needed for wheezing or shortness of breath. 06/23/16   Tower, Wynelle Fanny, MD  alendronate (FOSAMAX) 70 MG tablet Take 1 tablet (70 mg total) by mouth every 7 (seven) days. Take with a full glass of water on an empty stomach. 09/01/17   Tower, Wynelle Fanny, MD  Cholecalciferol (VITAMIN D3) 2000 units TABS Take 4,000 Units by mouth daily.    [provider]  clindamycin (CLEOCIN) 150 MG capsule TAKE 4 CAPSULES BY MOUTH 1 HR PRIOR TO DENTAL APPT 09/06/17   [provider]  diphenhydrAMINE (BENADRYL) 25 mg capsule Take 50 mg by mouth every 6 (six) hours as needed for itching.    [provider]  fluticasone (FLONASE) 50 MCG/ACT nasal spray Place 1 spray into both nostrils daily as needed for allergies.    [provider]  furosemide (LASIX) 40 MG tablet TAKE 1/2 TABLET BY MOUTH DAILY, MAY INCREASE TO 1 TABLET BY MOUTH DAILY IF NEEDED Patient taking differently: Take 20 mg by mouth daily. 08/19/16   Deboraha Sprang, MD  loratadine (CLARITIN) 10 MG tablet Take 10 mg by mouth daily as needed for allergies.     [provider]  metoprolol tartrate (LOPRESSOR) 25 MG tablet TAKE 1 TABLET (25 MG TOTAL) BY MOUTH 3 (THREE) TIMES DAILY AS NEEDED. FOR FAST HEART RATE Patient taking differently: Take 25 mg by mouth daily as needed (tachycardia).  09/06/17   Tower, Wynelle Fanny, MD  Polyethyl Glycol-Propyl Glycol (LUBRICANT EYE DROPS) 0.4-0.3 % SOLN Place 1-2 drops into both eyes 3 (three) times daily as needed (for dry eyes.).    [provider]  warfarin (COUMADIN) 5 MG tablet Take 1 tablet daily except 1/2 on Monday Wed and Fri or TAKE AS DIRECTED BY ANTI COAGULATION CLINIC 10/01/17   Tower, Wynelle Fanny, MD    Allergies as of 10/12/2017 - Review Complete 10/12/2017  Allergen Reaction Noted  . Amiodarone hcl Swelling   . Penicillins Rash  04/27/2007  . Statins Rash     Family History  Problem Relation Age of Onset  . Hypertension Mother   . Lung cancer Father        smoker  . Alcohol abuse Father   . Cancer Father        bladder and lung CA smoker  . Breast cancer Neg Hx     Social History   Socioeconomic History  . Marital status: Widowed    Spouse name: Not on file  . Number of children: 6  . Years of education: Not on file  . Highest education level:  Not on file  Occupational History  . Occupation: Architectural technologist: RETIRED  Social Needs  . Financial resource strain: Not on file  . Food insecurity:    Worry: Not on file    Inability: Not on file  . Transportation needs:    Medical: Not on file    Non-medical: Not on file  Tobacco Use  . Smoking status: Never Smoker  . Smokeless tobacco: Never Used  Substance and Sexual Activity  . Alcohol use: Yes    Alcohol/week: 0.0 oz    Comment: seldom  . Drug use: No  . Sexual activity: Not Currently  Lifestyle  . Physical activity:    Days per week: Not on file    Minutes per session: Not on file  . Stress: Not on file  Relationships  . Social connections:    Talks on phone: Not on file    Gets together: Not on file    Attends religious service: Not on file    Active member of club or organization: Not on file    Attends meetings of clubs or organizations: Not on file    Relationship status: Not on file  . Intimate partner violence:    Fear of current or ex partner: Not on file    Emotionally abused: Not on file    Physically abused: Not on file    Forced sexual activity: Not on file  Other Topics Concern  . Not on file  Social History Narrative   Retired. Daily Caffeine use: 2 daily     Review of Systems: See HPI, otherwise negative ROS  Physical Exam: BP 128/83   Pulse (!) 105   Temp (!) 97.1 F (36.2 C) (Tympanic)   Resp 18   Ht 5\' 4"  (1.626 m)   Wt 175 lb (79.4 kg)   SpO2 98%   BMI 30.04 kg/m  General:   Alert,  pleasant  and cooperative in NAD Head:  Normocephalic and atraumatic. Neck:  Supple; no masses or thyromegaly. Lungs:  Clear throughout to auscultation, normal respiratory effort.    Heart:  +S1, +S2, Regular rate and rhythm, No edema. Abdomen:  Soft, nontender and nondistended. Normal bowel sounds, without guarding, and without rebound.   Neurologic:  Alert and  oriented x4;  grossly normal neurologically.  Impression/Plan: [name] is here for an endoscopy and colonoscopy  to be performed for  evaluation of iron deficiency anemia.     Risks, benefits, limitations, and alternatives regarding endoscopy have been reviewed with the patient.  Questions have been answered.  All parties agreeable.   [name] Bellows, MD  10/13/2017, 12:32 PM

## 2017-10-13 NOTE — Anesthesia Post-op Follow-up Note (Signed)
Anesthesia QCDR form completed.        

## 2017-10-13 NOTE — Anesthesia Preprocedure Evaluation (Signed)
Anesthesia Evaluation  Patient identified by MRN, date of birth, ID band Patient awake    Reviewed: Allergy & Precautions, NPO status , Patient's Chart, lab work & pertinent test results, reviewed documented beta blocker date and time   History of Anesthesia Complications (+) PROLONGED EMERGENCE and history of anesthetic complications  Airway Mallampati: II  TM Distance: >3 FB     Dental  (+) Chipped   Pulmonary shortness of breath, asthma ,           Cardiovascular + Peripheral Vascular Disease and +CHF  + dysrhythmias Atrial Fibrillation + Valvular Problems/Murmurs MVP      Neuro/Psych PSYCHIATRIC DISORDERS Depression TIA   GI/Hepatic   Endo/Other  Hypothyroidism   Renal/GU      Musculoskeletal  (+) Arthritis ,   Abdominal   Peds  Hematology   Anesthesia Other Findings MV repair. Ablation.  Reproductive/Obstetrics                             Anesthesia Physical  Anesthesia Plan  ASA: III  Anesthesia Plan: General   Post-op Pain Management:    Induction: Intravenous  PONV Risk Score and Plan:   Airway Management Planned:   Additional Equipment:   Intra-op Plan:   Post-operative Plan:   Informed Consent: I have reviewed the patients History and Physical, chart, labs and discussed the procedure including the risks, benefits and alternatives for the proposed anesthesia with the patient or authorized representative who has indicated his/her understanding and acceptance.     Plan Discussed with: CRNA  Anesthesia Plan Comments:         Anesthesia Quick Evaluation

## 2017-10-13 NOTE — Anesthesia Procedure Notes (Signed)
Date/Time: 10/13/2017 12:58 PM Performed by: Johnna Acosta, CRNA Pre-anesthesia Checklist: Patient identified, Emergency Drugs available, Suction available, Patient being monitored and Timeout performed Patient Re-evaluated:Patient Re-evaluated prior to induction Oxygen Delivery Method: Nasal cannula Preoxygenation: Pre-oxygenation with 100% oxygen

## 2017-10-14 ENCOUNTER — Other Ambulatory Visit
Admission: RE | Admit: 2017-10-14 | Discharge: 2017-10-14 | Disposition: A | Discharge status: Home / Self Care | Payer: Medicare Other | Source: Ambulatory Visit | Attending: Gastroenterology | Admitting: Gastroenterology

## 2017-10-14 ENCOUNTER — Telehealth: Payer: Self-pay

## 2017-10-14 ENCOUNTER — Other Ambulatory Visit: Payer: Self-pay

## 2017-10-14 ENCOUNTER — Encounter: Payer: Self-pay | Admitting: Gastroenterology

## 2017-10-14 DIAGNOSIS — K625 Hemorrhage of anus and rectum: Secondary | ICD-10-CM | POA: Diagnosis not present

## 2017-10-14 DIAGNOSIS — D62 Acute posthemorrhagic anemia: Secondary | ICD-10-CM

## 2017-10-14 DIAGNOSIS — D509 Iron deficiency anemia, unspecified: Secondary | ICD-10-CM | POA: Diagnosis not present

## 2017-10-14 LAB — CBC WITH DIFFERENTIAL/PLATELET
Basophils Absolute: 0 10*3/uL (ref 0–0.1)
Basophils Relative: 1 %
EOS ABS: 0.1 10*3/uL (ref 0–0.7)
EOS PCT: 2 %
HCT: 34.8 % — ABNORMAL LOW (ref 35.0–47.0)
Hemoglobin: 11.5 g/dL — ABNORMAL LOW (ref 12.0–16.0)
Lymphocytes Relative: 17 %
Lymphs Abs: 0.9 10*3/uL — ABNORMAL LOW (ref 1.0–3.6)
MCH: 25.5 pg — ABNORMAL LOW (ref 26.0–34.0)
MCHC: 33.2 g/dL (ref 32.0–36.0)
MCV: 76.9 fL — ABNORMAL LOW (ref 80.0–100.0)
MONO ABS: 0.4 10*3/uL (ref 0.2–0.9)
MONOS PCT: 8 %
Neutro Abs: 3.7 10*3/uL (ref 1.4–6.5)
Neutrophils Relative %: 72 %
Platelets: 187 10*3/uL (ref 150–440)
RBC: 4.52 MIL/uL (ref 3.80–5.20)
RDW: 16.5 % — AB (ref 11.5–14.5)
WBC: 5.1 10*3/uL (ref 3.6–11.0)

## 2017-10-14 LAB — URINALYSIS, COMPLETE (UACMP) WITH MICROSCOPIC
Bilirubin Urine: NEGATIVE
Glucose, UA: NEGATIVE mg/dL
KETONES UR: 5 mg/dL — AB
NITRITE: NEGATIVE
PH: 6 (ref 5.0–8.0)
Protein, ur: NEGATIVE mg/dL
SPECIFIC GRAVITY, URINE: 1.01 (ref 1.005–1.030)

## 2017-10-14 LAB — IRON AND TIBC
IRON: 39 ug/dL (ref 28–170)
SATURATION RATIOS: 9 % — AB (ref 10.4–31.8)
TIBC: 439 ug/dL (ref 250–450)
UIBC: 400 ug/dL

## 2017-10-14 LAB — FERRITIN: FERRITIN: 13 ng/mL (ref 11–307)

## 2017-10-14 NOTE — Telephone Encounter (Signed)
-----   Message from Earlie Server, MD sent at 10/14/2017  3:03 PM EDT ----- Thank you Dr.Anna.  I will put her on my schedule.  Benjamine Mola, please arrange this patient to see me next week. Thank you.    ----- Message ----- From: [name], MD Sent: 10/14/2017  11:16 AM To: Abner Greenspan, MD, Johnell Landowski, CMA, #  Korena Nass inform patient still has low iron and microcytic anemia. She has severe  constipation and hence oral iron may not be well tolerated , has been on oral  Iron and still low . Suggest IV iron and referral to Dr Tasia Catchings in hematology .    C/c Tower, Wynelle Fanny, MD ,Dr Tasia Catchings   Dr [name] MD,MRCP [facility]) Gastroenterology/Hepatology Pager: [phone]

## 2017-10-14 NOTE — Telephone Encounter (Signed)
Pt notified of lab results and referral that will be sent to Dr. Tasia Catchings. Pt aware they will be calling to schedule consultation.

## 2017-10-14 NOTE — Progress Notes (Signed)
amb  

## 2017-10-14 NOTE — Anesthesia Postprocedure Evaluation (Signed)
Anesthesia Post Note  Patient: [name]  Procedure(s) Performed: COLONOSCOPY WITH PROPOFOL (N/A ) ESOPHAGOGASTRODUODENOSCOPY (EGD) WITH PROPOFOL (N/A )  Patient location during evaluation: PACU Anesthesia Type: General Level of consciousness: awake and alert and oriented Pain management: pain level controlled Vital Signs Assessment: post-procedure vital signs reviewed and stable Respiratory status: spontaneous breathing Cardiovascular status: blood pressure returned to baseline Anesthetic complications: no     Last Vitals:  Vitals:   10/13/17 1357 10/13/17 1405  BP: (!) 104/92 (!) 76/46  Pulse: (!) 115   Resp: 16   Temp: (!) 36.1 C   SpO2: 98%     Last Pain:  Vitals:   10/13/17 1405  TempSrc:   PainSc: 0-No pain                 Carless Slatten

## 2017-10-15 LAB — CELIAC DISEASE PANEL
Endomysial Ab, IgA: NEGATIVE
IgA: 536 mg/dL — ABNORMAL HIGH (ref 64–422)
Tissue Transglutaminase Ab, IgA: <2 U/mL (ref 0–3)

## 2017-10-15 LAB — SURGICAL PATHOLOGY

## 2017-10-19 ENCOUNTER — Encounter: Payer: Self-pay | Admitting: Gastroenterology

## 2017-10-21 ENCOUNTER — Telehealth: Payer: Self-pay | Admitting: Family Medicine

## 2017-10-21 ENCOUNTER — Other Ambulatory Visit: Payer: Self-pay | Admitting: Oncology

## 2017-10-21 DIAGNOSIS — D509 Iron deficiency anemia, unspecified: Secondary | ICD-10-CM | POA: Insufficient documentation

## 2017-10-21 NOTE — Telephone Encounter (Signed)
Yes, I can see where a 6 months supply was sent in on 10/01/17 to [name].  Will wait to hear back from patient as she is validating with [employer].

## 2017-10-21 NOTE — Telephone Encounter (Signed)
Copied from [place] [id number]. Topic: Quick Communication - See Telephone Encounter >> Oct 21, 2017  1:32 PM [name], NT wrote: CRM for notification. See Telephone encounter for: 10/21/17.  Patient is calling and states she received a letter in the mail from [facility] stating they could not get in touch with the office to refill warfarin (COUMADIN) 5 MG tablet. I informed the patient we sent a refill on 10/01/17 with 1 refill and she states the letter was dated 10/14/17. Patient states she would double check with [employer]. Please advise.

## 2017-10-22 ENCOUNTER — Inpatient Hospital Stay: Payer: Medicare Other

## 2017-10-22 ENCOUNTER — Inpatient Hospital Stay: Payer: Medicare Other | Attending: Oncology | Admitting: Oncology

## 2017-10-22 ENCOUNTER — Encounter: Payer: Self-pay | Admitting: Oncology

## 2017-10-22 ENCOUNTER — Other Ambulatory Visit: Payer: Self-pay

## 2017-10-22 VITALS — BP 121/69 | HR 91 | Resp 20

## 2017-10-22 VITALS — BP 119/72 | HR 79 | Temp 97.9°F | Resp 18 | Ht 65.0 in | Wt 175.6 lb

## 2017-10-22 DIAGNOSIS — I482 Chronic atrial fibrillation: Secondary | ICD-10-CM | POA: Insufficient documentation

## 2017-10-22 DIAGNOSIS — Z801 Family history of malignant neoplasm of trachea, bronchus and lung: Secondary | ICD-10-CM | POA: Insufficient documentation

## 2017-10-22 DIAGNOSIS — Z8052 Family history of malignant neoplasm of bladder: Secondary | ICD-10-CM | POA: Diagnosis not present

## 2017-10-22 DIAGNOSIS — Z86010 Personal history of colonic polyps: Secondary | ICD-10-CM | POA: Diagnosis not present

## 2017-10-22 DIAGNOSIS — D509 Iron deficiency anemia, unspecified: Secondary | ICD-10-CM

## 2017-10-22 DIAGNOSIS — Z7901 Long term (current) use of anticoagulants: Secondary | ICD-10-CM | POA: Insufficient documentation

## 2017-10-22 DIAGNOSIS — R5383 Other fatigue: Secondary | ICD-10-CM | POA: Diagnosis not present

## 2017-10-22 MED ORDER — IRON SUCROSE 20 MG/ML IV SOLN
200.0000 mg | Freq: Once | INTRAVENOUS | Status: AC
  Administered 2017-10-22: 200 mg via INTRAVENOUS
  Filled 2017-10-22: qty 10

## 2017-10-22 MED ORDER — SODIUM CHLORIDE 0.9 % IV SOLN
Freq: Once | INTRAVENOUS | Status: AC
Start: 2017-10-22 — End: 2017-10-22
  Administered 2017-10-22: 15:00:00 via INTRAVENOUS
  Filled 2017-10-22: qty 1000

## 2017-10-22 NOTE — Progress Notes (Signed)
Hematology/Oncology Consult note [facility] [phone] [phone]   Patient Care Team: Tower, Wynelle Fanny, MD as PCP - General  REFERRING PROVIDER: Dr.Anna CHIEF COMPLAINTS/REASON FOR VISIT:  Evaluation of iron deficiency anemia.   HISTORY OF PRESENTING ILLNESS:  [name] is a  84 y.o.  female with PMH listed below who was referred to me for evaluation of iron deficiency anemia.  Patient reports having worsening of fatigue since her hip surgery on 05/04/2017.  Chronic A fib, on  coumadin.  09/20/2017 She presented to ER for rectal bleeding.  Lab work up showed hemoglobin 11.3, with MCV 77,  10/13/2017 Colonoscopy showed multiple polyps in the transverse colon and in the ascending colon, removed.  Submucosal nodule in distal rectum.  Upper endoscopy: normal esophagus, hiatal hernia, otherwise normal.  Pathology showed tubular adenoma. Negative for malignancy.  Duodenum biopsy negative for intraepithelial lymphocytosis, dysplasia and malignancy.   Further work up includes iron panel which was obtain on 10/14/2017. Consistent with iron deficiency anemia.  History of iron deficiency: denies Rectal bleeding: yes Menstrual bleeding/ Vaginal bleeding : post menopausal Hematemesis or hemoptysis : denies Blood in urine : denies  Pica: denies Fatigue: Yes.  SOB: deneis   Review of Systems  Constitutional: Positive for malaise/fatigue. Negative for chills, fever and weight loss.  HENT: Negative for nosebleeds and sore throat.   Eyes: Negative for double vision, photophobia and redness.  Respiratory: Negative for cough, shortness of breath and wheezing.   Cardiovascular: Negative for chest pain, palpitations and orthopnea.  Gastrointestinal: Negative for abdominal pain, blood in stool, nausea and vomiting.  Genitourinary: Negative for dysuria.  Musculoskeletal: Negative for back pain, myalgias and neck pain.  Skin: Negative for itching and  rash.  Neurological: Negative for dizziness, tingling and tremors.  Endo/Heme/Allergies: Negative for environmental allergies. Does not bruise/bleed easily.  Psychiatric/Behavioral: Negative for depression.    MEDICAL HISTORY:  Past Medical History:  Diagnosis Date  . Allergic rhinitis   . 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 Arthritis   . Atrial fibrillation -persistent cardiologist-  dr klein/  primary EP -- dr Tawanna Sat ([facility])   a. s/p PVI [name] 2010;  b. on tikosyn/coumadin;  c. 05/2009 Echo: EF 60-65%, Gr 2 DD. (first dx 09/ 2007)  . Bilateral lower extremity edema   . Bleeding hemorrhoid   . Carotid stenosis    mild (hosp 3/11)- consult by vasc/ Dr Donnetta Hutching  . Complication of anesthesia    hard to wake  . Diverticulosis of colon   . Dyspnea    on exertion-climbing stairs  . Dysrhythmia   . Fatty liver   . H/O cardiac radiofrequency ablation    01/ 2008 at [facility] [place] /  03/ 2010  at [facility]  . Heart failure with preserved ejection fraction ([place])   . Heart murmur    "prior to valve repair"  . History of adenomatous polyp of colon    tubular adenoma's  . History of cardiomyopathy    secondary tachycardia-induced cardiomyopathy -- resolved 2014  . History of squamous cell carcinoma in situ (SCCIS) of skin    05/ 2017  nasal bridge and right medial knee  . History of transient ischemic attack (TIA)    01-24-2005 and 06-12-2009  . Hyperlipidemia   . Hypothyroidism   . Mild intermittent asthma    reacts to cats  . Mixed stress and urge urinary incontinence   . Pulmonary nodule   . S/P mitral valve  repair 10-23-1998  dr Boyce Medici at [facility]   for MVP and regurg. (annuloplasty ring procedure)  . Swelling of left extremity 2017   states it's gotten worse  . Varicose vein of leg   . Wears glasses     SURGICAL HISTORY: Past Surgical History:  Procedure Laterality Date  . APPENDECTOMY  1978  . CARDIAC ELECTROPHYSIOLOGY [place] AND ABLATION  01/ 2008    at  [place]   right-sided ablation atrial flutter  . CARDIAC ELECTROPHYSIOLOGY STUDY AND ABLATION  03/ 2010   dr Jaymes Graff at [facility]   AV node ablation and pulmonary vein isolation for atrial fib  . CARDIOVERSION  06-18-2006;  07-13-2006;  10-19-2010;  10-27-2010  . COLONOSCOPY    . COLONOSCOPY WITH PROPOFOL N/A 10/13/2017   Procedure: COLONOSCOPY WITH PROPOFOL;  Surgeon: [name], MD;  Location: [facility] ENDOSCOPY;  Service: Gastroenterology;  Laterality: N/A;  . CYSTO/ TRANSURETHRAL COLLAGEN INJECTION THERAPY  07-26-2007   dr Matilde Sprang  . DILATION AND CURETTAGE OF UTERUS    . ESOPHAGOGASTRODUODENOSCOPY (EGD) WITH PROPOFOL N/A 10/13/2017   Procedure: ESOPHAGOGASTRODUODENOSCOPY (EGD) WITH PROPOFOL;  Surgeon: [name], MD;  Location: [facility] ENDOSCOPY;  Service: Gastroenterology;  Laterality: N/A;  . EXCISIONAL HEMORRHOIDECTOMY  1980s  . HEMORRHOID SURGERY N/A 10/29/2016   Procedure: HEMORRHOIDECTOMY;  Surgeon: Leighton Ruff, MD;  Location: [facility];  Service: General;  Laterality: N/A;  . MITRAL VALVE ANNULOPLASTY  10/23/1998   "Model [id number]; Campbell Lerner [id number]"; size 49mm; [facility]; Dr. Boyce Medici  . PILONIDAL CYST EXCISION  1954  . TEE WITH CARDIOVERSION  05-06-2006 at [facility];  01-02-2013 at [facility]  . TOTAL HIP ARTHROPLASTY Left 05/04/2017   Procedure: LEFT TOTAL HIP ARTHROPLASTY ANTERIOR APPROACH;  Surgeon: Mcarthur Rossetti, MD;  Location: [place];  Service: Orthopedics;  Laterality: Left;  . TRANSTHORACIC ECHOCARDIOGRAM  05-01-2015   dr Caryl Comes   ef 50-55%/  mild AV sclerosis without stenosis/  post MV repair with mild central MR (valve area by pressure half-time 2cm^2,  valve area by continutity equation 0.91cm^2, peak grandiant 71mmHg)/  severe LAE/ mild TR/ mild RAE   . TUBAL LIGATION Bilateral 1978    SOCIAL HISTORY: Social History   Socioeconomic History  . Marital status: Widowed    Spouse name: Not on file  . Number of children: 6  . Years of education:  Not on file  . Highest education level: Not on file  Occupational History  . Occupation: Architectural technologist: RETIRED  Social Needs  . Financial resource strain: Not on file  . Food insecurity:    Worry: Not on file    Inability: Not on file  . Transportation needs:    Medical: Not on file    Non-medical: Not on file  Tobacco Use  . Smoking status: Never Smoker  . Smokeless tobacco: Never Used  Substance and Sexual Activity  . Alcohol use: Yes    Alcohol/week: 0.0 oz    Comment: seldom  . Drug use: No  . Sexual activity: Not Currently  Lifestyle  . Physical activity:    Days per week: Not on file    Minutes per session: Not on file  . Stress: Not on file  Relationships  . Social connections:    Talks on phone: Not on file    Gets together: Not on file    Attends religious service: Not on file    Active member of club or organization: Not on file    Attends  meetings of clubs or organizations: Not on file    Relationship status: Not on file  . Intimate partner violence:    Fear of current or ex partner: Not on file    Emotionally abused: Not on file    Physically abused: Not on file    Forced sexual activity: Not on file  Other Topics Concern  . Not on file  Social History Narrative   Retired. Daily Caffeine use: 2 daily     FAMILY HISTORY: Family History  Problem Relation Age of Onset  . Lung cancer Father        smoker  . Alcohol abuse Father   . Cancer Father        bladder and lung CA smoker  . Breast cancer Neg Hx     ALLERGIES:  is allergic to amiodarone hcl; penicillins; and statins.  MEDICATIONS:  Current Outpatient Medications  Medication Sig Dispense Refill  . albuterol (PROVENTIL HFA;VENTOLIN HFA) 108 (90 Base) MCG/ACT inhaler Inhale 2 puffs into the lungs every 4 (four) hours as needed for wheezing or shortness of breath. 1 Inhaler 0  . alendronate (FOSAMAX) 70 MG tablet Take 1 tablet (70 mg total) by mouth every 7 (seven) days. Take with a  full glass of water on an empty stomach. 4 tablet 11  . Cholecalciferol (VITAMIN D3) 2000 units TABS Take 4,000 Units by mouth daily.    . clindamycin (CLEOCIN) 150 MG capsule TAKE 4 CAPSULES BY MOUTH 1 HR PRIOR TO DENTAL APPT  0  . diltiazem (CARTIA XT) 180 MG 24 hr capsule Take 1 capsule (180 mg total) by mouth daily. 90 capsule 3  . diphenhydrAMINE (BENADRYL) 25 mg capsule Take 50 mg by mouth every 6 (six) hours as needed for itching.    . fluticasone (FLONASE) 50 MCG/ACT nasal spray Place 1 spray into both nostrils daily as needed for allergies.    . furosemide (LASIX) 40 MG tablet TAKE 1/2 TABLET BY MOUTH DAILY, MAY INCREASE TO 1 TABLET BY MOUTH DAILY IF NEEDED (Patient taking differently: Take 20 mg by mouth daily.) 90 tablet 2  . levothyroxine (SYNTHROID, LEVOTHROID) 25 MCG tablet TAKE 1 TABLET EVERY DAY BEFORE BREAKFAST 90 tablet 1  . loratadine (CLARITIN) 10 MG tablet Take 10 mg by mouth daily as needed for allergies.     . metoprolol tartrate (LOPRESSOR) 25 MG tablet TAKE 1 TABLET (25 MG TOTAL) BY MOUTH 3 (THREE) TIMES DAILY AS NEEDED. FOR FAST HEART RATE (Patient taking differently: Take 25 mg by mouth daily as needed (tachycardia). ) 60 tablet 5  . Polyethyl Glycol-Propyl Glycol (LUBRICANT EYE DROPS) 0.4-0.3 % SOLN Place 1-2 drops into both eyes 3 (three) times daily as needed (for dry eyes.).    [name] warfarin (COUMADIN) 5 MG tablet Take 1 tablet daily except 1/2 on Monday Wed and Fri or TAKE AS DIRECTED BY ANTI COAGULATION CLINIC 105 tablet 1  . enoxaparin (LOVENOX) 120 MG/0.8ML injection Inject 0.8 mLs (120 mg total) into the skin daily. (Patient not taking: Reported on 10/22/2017) 6 Syringe 0   No current facility-administered medications for this visit.      PHYSICAL EXAMINATION: ECOG PERFORMANCE STATUS: 1 - Symptomatic but completely ambulatory Vitals:   10/22/17 1357  BP: 119/72  Pulse: 79  Resp: (!) 181  Temp: 97.9 F (36.6 C)  SpO2: 96%   Filed Weights   10/22/17 1357    Weight: 175 lb 9.6 oz (79.7 kg)    Physical Exam  Constitutional: She is  oriented to person, place, and time. No distress.  HENT:  Head: Normocephalic and atraumatic.  Mouth/Throat: Oropharynx is clear and moist.  Eyes: Pupils are equal, round, and reactive to light. EOM are normal. No scleral icterus.  Neck: Normal range of motion. Neck supple.  Cardiovascular: Normal rate, regular rhythm and normal heart sounds.  Pulmonary/Chest: Effort normal and breath sounds normal. No respiratory distress. She has no wheezes.  Abdominal: Soft. Bowel sounds are normal. She exhibits no distension and no mass. There is no tenderness.  Musculoskeletal: Normal range of motion. She exhibits no edema or deformity.  Neurological: She is alert and oriented to person, place, and time. No cranial nerve deficit. Coordination normal.  Skin: Skin is warm and dry. No rash noted. No erythema.  Psychiatric: She has a normal mood and affect. Her behavior is normal. Thought content normal.     LABORATORY DATA:  I have reviewed the data as listed Lab Results  Component Value Date   WBC 5.1 10/14/2017   HGB 11.5 (L) 10/14/2017   HCT 34.8 (L) 10/14/2017   MCV 76.9 (L) 10/14/2017   PLT 187 10/14/2017   Recent Labs    05/07/17 0735 07/02/17 1344 08/02/17 1146 09/01/17 1111 09/20/17 1026  NA 139 140 140 139 140  K 4.1 3.6 4.2 3.8 3.7  CL 100* 104 102 100 106  CO2 27 29 31 31 26   GLUCOSE 101* 96 96 93 121*  BUN 12 20 23 22 17   CREATININE 0.74 0.75 0.80 0.80 0.63  CALCIUM 8.0* 9.2 9.2 9.5 8.6*  GFRNONAA >60 >60  --   --  >60  GFRAA >60 >60  --   --  >60  PROT  --   --  7.3 7.5 7.5  ALBUMIN  --   --  4.1 4.1 3.9  AST  --   --  17 17 21   ALT  --   --  10 12 12   ALKPHOS  --   --  44 57 60  BILITOT  --   --  0.6 0.6 0.8   Iron/TIBC/Ferritin/ %Sat    Component Value Date/Time   IRON 39 10/14/2017 0956   TIBC 439 10/14/2017 0956   FERRITIN 13 10/14/2017 0956   IRONPCTSAT 9 (L) 10/14/2017 0956         ASSESSMENT & PLAN:  1. Iron deficiency anemia, unspecified iron deficiency anemia type    Labs were reviewed and discussed with patient.  She has iron deficiency anemia. Patient has chronic constipation and can not tolerate oral iron supplements.  Plan IV iron with Venofer 200mg  weekly x 4 doses. Allergy reactions/infusion reaction including anaphylactic reaction discussed with patient. Other side effects include but not limited to high blood pressure, skin rash, weight gain, leg swelling, etc. Patient voices understanding and willing to proceed.   Orders Placed This Encounter  Procedures  . CBC with Differential/Platelet    Standing Status:   Future    Standing Expiration Date:   10/23/2018  . Iron and TIBC    Standing Status:   Future    Standing Expiration Date:   10/23/2018  . Ferritin    Standing Status:   Future    Standing Expiration Date:   10/23/2018    All questions were answered. The patient knows to call the clinic with any problems questions or concerns.  Return of visit: 2 months.  Thank you for this kind referral and the opportunity to participate in the care of this patient.  A copy of today's note is routed to referring provider  Total face to face encounter time for this patient visit was 45 min. >50% of the time was  spent in counseling and coordination of care.    Earlie Server, MD, PhD Hematology Oncology [facility] Pager- [phone] 10/22/2017

## 2017-10-22 NOTE — Progress Notes (Signed)
Patient here for initial visit. °

## 2017-10-25 ENCOUNTER — Encounter: Payer: Self-pay | Admitting: Gastroenterology

## 2017-10-29 ENCOUNTER — Inpatient Hospital Stay: Payer: Medicare Other

## 2017-10-29 VITALS — BP 126/74 | Temp 97.5°F | Resp 18

## 2017-10-29 DIAGNOSIS — I482 Chronic atrial fibrillation: Secondary | ICD-10-CM | POA: Diagnosis not present

## 2017-10-29 DIAGNOSIS — Z86010 Personal history of colonic polyps: Secondary | ICD-10-CM | POA: Diagnosis not present

## 2017-10-29 DIAGNOSIS — D509 Iron deficiency anemia, unspecified: Secondary | ICD-10-CM | POA: Diagnosis not present

## 2017-10-29 DIAGNOSIS — Z801 Family history of malignant neoplasm of trachea, bronchus and lung: Secondary | ICD-10-CM | POA: Diagnosis not present

## 2017-10-29 DIAGNOSIS — Z7901 Long term (current) use of anticoagulants: Secondary | ICD-10-CM | POA: Diagnosis not present

## 2017-10-29 DIAGNOSIS — R5383 Other fatigue: Secondary | ICD-10-CM | POA: Diagnosis not present

## 2017-10-29 MED ORDER — SODIUM CHLORIDE 0.9 % IV SOLN
Freq: Once | INTRAVENOUS | Status: AC
  Administered 2017-10-29: 14:00:00 via INTRAVENOUS
  Filled 2017-10-29: qty 1000

## 2017-10-29 MED ORDER — IRON SUCROSE 20 MG/ML IV SOLN
200.0000 mg | Freq: Once | INTRAVENOUS | Status: AC
  Administered 2017-10-29: 200 mg via INTRAVENOUS
  Filled 2017-10-29: qty 10

## 2017-11-03 ENCOUNTER — Telehealth: Payer: Self-pay

## 2017-11-03 NOTE — Telephone Encounter (Signed)
Pt notified of results. Pt will be scheduled for a capsule study on 12/08/17.  Referral placed to general surgery for consultation of removal of small polyp near anus.

## 2017-11-03 NOTE — Telephone Encounter (Signed)
-----   Message from [name], MD sent at 11/01/2017 11:37 AM EDT ----- [name]   Inform patient - duodenal bx results were normal.  2. Needs capsule study of small bowel  3. Needs referral to surgery to excise small polyp close to anus 4. Due to poor prep and mulktiple adenomas resected- could repeat colonoscopy in 1 year

## 2017-11-04 ENCOUNTER — Other Ambulatory Visit: Payer: Self-pay

## 2017-11-04 DIAGNOSIS — D62 Acute posthemorrhagic anemia: Secondary | ICD-10-CM

## 2017-11-05 ENCOUNTER — Inpatient Hospital Stay: Payer: Medicare Other

## 2017-11-05 VITALS — BP 100/60 | HR 88 | Resp 20

## 2017-11-05 DIAGNOSIS — Z801 Family history of malignant neoplasm of trachea, bronchus and lung: Secondary | ICD-10-CM | POA: Diagnosis not present

## 2017-11-05 DIAGNOSIS — D509 Iron deficiency anemia, unspecified: Secondary | ICD-10-CM | POA: Diagnosis not present

## 2017-11-05 DIAGNOSIS — Z7901 Long term (current) use of anticoagulants: Secondary | ICD-10-CM | POA: Diagnosis not present

## 2017-11-05 DIAGNOSIS — Z86010 Personal history of colonic polyps: Secondary | ICD-10-CM | POA: Diagnosis not present

## 2017-11-05 DIAGNOSIS — R5383 Other fatigue: Secondary | ICD-10-CM | POA: Diagnosis not present

## 2017-11-05 DIAGNOSIS — I482 Chronic atrial fibrillation: Secondary | ICD-10-CM | POA: Diagnosis not present

## 2017-11-05 MED ORDER — IRON SUCROSE 20 MG/ML IV SOLN
200.0000 mg | Freq: Once | INTRAVENOUS | Status: AC
  Administered 2017-11-05: 200 mg via INTRAVENOUS
  Filled 2017-11-05: qty 10

## 2017-11-05 MED ORDER — SODIUM CHLORIDE 0.9 % IV SOLN
Freq: Once | INTRAVENOUS | Status: AC
  Administered 2017-11-05: 14:00:00 via INTRAVENOUS
  Filled 2017-11-05: qty 1000

## 2017-11-11 ENCOUNTER — Encounter: Payer: Self-pay | Admitting: Gastroenterology

## 2017-11-11 ENCOUNTER — Ambulatory Visit (INDEPENDENT_AMBULATORY_CARE_PROVIDER_SITE_OTHER): Payer: Medicare Other | Admitting: General Practice

## 2017-11-11 ENCOUNTER — Ambulatory Visit (INDEPENDENT_AMBULATORY_CARE_PROVIDER_SITE_OTHER): Payer: Medicare Other | Admitting: Gastroenterology

## 2017-11-11 VITALS — BP 114/68 | HR 96 | Ht 65.0 in | Wt 176.8 lb

## 2017-11-11 DIAGNOSIS — K621 Rectal polyp: Secondary | ICD-10-CM | POA: Diagnosis not present

## 2017-11-11 DIAGNOSIS — D509 Iron deficiency anemia, unspecified: Secondary | ICD-10-CM | POA: Diagnosis not present

## 2017-11-11 DIAGNOSIS — I4891 Unspecified atrial fibrillation: Secondary | ICD-10-CM

## 2017-11-11 DIAGNOSIS — Z7901 Long term (current) use of anticoagulants: Secondary | ICD-10-CM

## 2017-11-11 LAB — POCT INR: INR: 1.7 — AB (ref 2.0–3.0)

## 2017-11-11 MED ORDER — LINACLOTIDE 72 MCG PO CAPS: 72.0000 ug | ORAL_CAPSULE | Freq: Every day | ORAL | 3 refills | Status: DC

## 2017-11-11 NOTE — Patient Instructions (Addendum)
Pre visit review using our clinic review tool, if applicable. No additional management support is needed unless otherwise documented below in the visit note.  Continue to take 1 pill (5mg ) daily EXCEPT for 1/2 pill (2.5mg ) on Mondays and Fridays.   Re-check in 2 weeks

## 2017-11-11 NOTE — Progress Notes (Signed)
[name] Bellows MD, MRCP(U.K) [address]  [place], [place] [address]  Main: [phone]  Fax: [phone]   Primary Care Physician: Key, [name] Fanny, MD  Primary Gastroenterologist:  Dr. Jonathon Key   No chief complaint on file.   HPI: [name] is a 84 y.o. female   Summary of history :  She is here today to see me for a follow up for iron deficiency anemia,constipation. I had initially seen her back on 09/29/17 for rectal bleeding. She has a history of A fib on coumadin. She presented to the ER on 09/20/17 with painless rectal bleeding.At the ER when she had her labs checked - INR 1.80 . Hb 11.3 with MCV 77, looking back her MCV has gradually decreased over the past 2 months . Last colonoscopy was back in 2016 and two tubular adenomas were excised.   Interval history   09/29/2017-  11/11/2017  Getting IV iron : Since since " oh my goodness  All the difference in the world" Celiac serology, urine for blood- negative.   10/13/17: EGD+ colonoscopy- 12 colon polyps excised , a single submucosal lesion close to anus not taken out . 4 cm hiatal hernia seen on EGD. Duodenal bx were normal , 8 tubular adenomas noted .Repeat colonoscopy in 1 year due to poor prep and multiple polyps.  She has been scheduled for a capsule endoscopy of her small bowel on 12/08/2017.  She has seen Dr. Tasia Key in oncology for IV iron.   Iron/TIBC/Ferritin/ %Sat    Component Value Date/Time   IRON 39 10/14/2017 0956   TIBC 439 10/14/2017 0956   FERRITIN 13 10/14/2017 0956   IRONPCTSAT 9 (L) 10/14/2017 0956       Current Outpatient Medications  Medication Sig Dispense Refill  . albuterol (PROVENTIL HFA;VENTOLIN HFA) 108 (90 Base) MCG/ACT inhaler Inhale 2 puffs into the lungs every 4 (four) hours as needed for wheezing or shortness of breath. 1 Inhaler 0  . alendronate (FOSAMAX) 70 MG tablet Take 1 tablet (70 mg total) by mouth every 7 (seven) days. Take with a full glass of water on an empty  stomach. 4 tablet 11  . Cholecalciferol (VITAMIN D3) 2000 units TABS Take 4,000 Units by mouth daily.    . clindamycin (CLEOCIN) 150 MG capsule TAKE 4 CAPSULES BY MOUTH 1 HR PRIOR TO DENTAL APPT  0  . diltiazem (CARTIA XT) 180 MG 24 hr capsule Take 1 capsule (180 mg total) by mouth daily. 90 capsule 3  . diphenhydrAMINE (BENADRYL) 25 mg capsule Take 50 mg by mouth every 6 (six) hours as needed for itching.    . enoxaparin (LOVENOX) 120 MG/0.8ML injection Inject 0.8 mLs (120 mg total) into the skin daily. (Patient not taking: Reported on 10/22/2017) 6 Syringe 0  . fluticasone (FLONASE) 50 MCG/ACT nasal spray Place 1 spray into both nostrils daily as needed for allergies.    . furosemide (LASIX) 40 MG tablet TAKE 1/2 TABLET BY MOUTH DAILY, MAY INCREASE TO 1 TABLET BY MOUTH DAILY IF NEEDED (Patient taking differently: Take 20 mg by mouth daily.) 90 tablet 2  . levothyroxine (SYNTHROID, LEVOTHROID) 25 MCG tablet TAKE 1 TABLET EVERY DAY BEFORE BREAKFAST 90 tablet 1  . loratadine (CLARITIN) 10 MG tablet Take 10 mg by mouth daily as needed for allergies.     . metoprolol tartrate (LOPRESSOR) 25 MG tablet TAKE 1 TABLET (25 MG TOTAL) BY MOUTH 3 (THREE) TIMES DAILY AS NEEDED. FOR FAST HEART RATE (  Patient taking differently: Take 25 mg by mouth daily as needed (tachycardia). ) 60 tablet 5  . Polyethyl Glycol-Propyl Glycol (LUBRICANT EYE DROPS) 0.4-0.3 % SOLN Place 1-2 drops into both eyes 3 (three) times daily as needed (for dry eyes.).    [name] warfarin (COUMADIN) 5 MG tablet Take 1 tablet daily except 1/2 on Monday Wed and Fri or TAKE AS DIRECTED BY ANTI COAGULATION CLINIC 105 tablet 1   No current facility-administered medications for this visit.     Allergies as of 11/11/2017 - Review Complete 10/22/2017  Allergen Reaction Noted  . Amiodarone hcl Swelling   . Penicillins Rash 04/27/2007  . Statins Rash     ROS:  General: Negative for anorexia, weight loss, fever, chills, fatigue, weakness. ENT:  Negative for hoarseness, difficulty swallowing , nasal congestion. CV: Negative for chest pain, angina, palpitations, dyspnea on exertion, peripheral edema.  Respiratory: Negative for dyspnea at rest, dyspnea on exertion, cough, sputum, wheezing.  GI: See history of present illness. GU:  Negative for dysuria, hematuria, urinary incontinence, urinary frequency, nocturnal urination.  Endo: Negative for unusual weight change.    Physical Examination:   There were no vitals taken for this visit.  General: Well-nourished, well-developed in no acute distress.  Eyes: No icterus. Conjunctivae pink. Mouth: Oropharyngeal mucosa moist and pink , no lesions erythema or exudate. Lungs: Clear to auscultation bilaterally. Non-labored. Heart: Regular rate and rhythm, no murmurs rubs or gallops.  Abdomen: Bowel sounds are normal, nontender, nondistended, no hepatosplenomegaly or masses, no abdominal bruits or hernia , no rebound or guarding.   Extremities: No lower extremity edema. No clubbing or deformities. Neuro: Alert and oriented x 3.  Grossly intact. Skin: Warm and dry, no jaundice.   Psych: Alert and cooperative, normal mood and affect.   Imaging Studies: No results found.  Assessment and Plan:   [name] is a 84 y.o. y/o female here to follow up for rectal bleeding, iron deficiency anemia,constipation . Review of her labs suggests she has a new onset iron deficiency anemia.  Colonoscopy showed multiple tubular adenomas and a polyp close to the anus that was not resected.  She has been referred for resection of this polyp.  Upper endoscopy showed a 4 cm hiatal hernia otherwise was normal.   Plan  1.  Check B12 folate  2. Capsule study of the small bowel  3. Linzess 72 mcg for constipation refill given as working well  4. Follow-up with surgical referral for resection of polyp close to the anus.   Risks, benefits, alternatives of Givens capsule discussed with patient to include but  not limited to the rare risk of Given's capsule becoming lodged in the GI tract requiring surgical removal.  The patient agrees with this plan & consent will be obtained.   Dr [name] Bellows  MD,MRCP [facility]) Follow up in 3 months

## 2017-11-12 ENCOUNTER — Telehealth: Payer: Self-pay | Admitting: Gastroenterology

## 2017-11-12 ENCOUNTER — Inpatient Hospital Stay: Payer: Medicare Other

## 2017-11-12 VITALS — BP 119/77 | HR 92

## 2017-11-12 DIAGNOSIS — R5383 Other fatigue: Secondary | ICD-10-CM | POA: Diagnosis not present

## 2017-11-12 DIAGNOSIS — D509 Iron deficiency anemia, unspecified: Secondary | ICD-10-CM | POA: Diagnosis not present

## 2017-11-12 DIAGNOSIS — I482 Chronic atrial fibrillation: Secondary | ICD-10-CM | POA: Diagnosis not present

## 2017-11-12 DIAGNOSIS — Z801 Family history of malignant neoplasm of trachea, bronchus and lung: Secondary | ICD-10-CM | POA: Diagnosis not present

## 2017-11-12 DIAGNOSIS — Z7901 Long term (current) use of anticoagulants: Secondary | ICD-10-CM | POA: Diagnosis not present

## 2017-11-12 DIAGNOSIS — Z86010 Personal history of colonic polyps: Secondary | ICD-10-CM | POA: Diagnosis not present

## 2017-11-12 MED ORDER — SODIUM CHLORIDE 0.9 % IV SOLN
Freq: Once | INTRAVENOUS | Status: AC
  Administered 2017-11-12: 14:00:00 via INTRAVENOUS
  Filled 2017-11-12: qty 250

## 2017-11-12 MED ORDER — SODIUM CHLORIDE 0.9 % IV SOLN: 200.0000 mg | Freq: Once | INTRAVENOUS | Status: DC

## 2017-11-12 MED ORDER — IRON SUCROSE 20 MG/ML IV SOLN
200.0000 mg | Freq: Once | INTRAVENOUS | Status: AC
  Administered 2017-11-12: 200 mg via INTRAVENOUS
  Filled 2017-11-12: qty 10

## 2017-11-12 NOTE — Telephone Encounter (Signed)
Judson Roch from [facility] left vm pt is schedule to see Doctor there but they need notes faxed to fax# [phone]   And cb [phone]

## 2017-11-12 NOTE — Telephone Encounter (Signed)
Notes faxed to [facility] per their request.

## 2017-11-25 ENCOUNTER — Ambulatory Visit: Payer: Medicare Other

## 2017-12-02 ENCOUNTER — Ambulatory Visit (INDEPENDENT_AMBULATORY_CARE_PROVIDER_SITE_OTHER): Payer: Medicare Other | Admitting: General Practice

## 2017-12-02 DIAGNOSIS — I4891 Unspecified atrial fibrillation: Secondary | ICD-10-CM

## 2017-12-02 DIAGNOSIS — Z7901 Long term (current) use of anticoagulants: Secondary | ICD-10-CM

## 2017-12-02 LAB — POCT INR: INR: 2.9 (ref 2.0–3.0)

## 2017-12-02 NOTE — Patient Instructions (Addendum)
Pre visit review using our clinic review tool, if applicable. No additional management support is needed unless otherwise documented below in the visit note.  Continue to take 1 pill (5mg) daily EXCEPT for 1/2 pill (2.5mg) on Mondays and Fridays.   Re-check in 4 weeks.  

## 2017-12-08 ENCOUNTER — Encounter
Admission: RE | Disposition: A | Discharge status: Home / Self Care | Payer: Self-pay | Source: Ambulatory Visit | Attending: Gastroenterology

## 2017-12-08 ENCOUNTER — Ambulatory Visit
Admission: RE | Admit: 2017-12-08 | Discharge: 2017-12-08 | Disposition: A | Discharge status: Home / Self Care | Payer: Medicare Other | Source: Ambulatory Visit | Attending: Gastroenterology | Admitting: Gastroenterology

## 2017-12-08 ENCOUNTER — Encounter: Payer: Self-pay | Admitting: Gastroenterology

## 2017-12-08 DIAGNOSIS — D509 Iron deficiency anemia, unspecified: Secondary | ICD-10-CM | POA: Diagnosis not present

## 2017-12-08 DIAGNOSIS — Q2733 Arteriovenous malformation of digestive system vessel: Secondary | ICD-10-CM | POA: Diagnosis not present

## 2017-12-08 DIAGNOSIS — K625 Hemorrhage of anus and rectum: Secondary | ICD-10-CM | POA: Diagnosis not present

## 2017-12-08 DIAGNOSIS — Z7901 Long term (current) use of anticoagulants: Secondary | ICD-10-CM | POA: Insufficient documentation

## 2017-12-08 DIAGNOSIS — I4891 Unspecified atrial fibrillation: Secondary | ICD-10-CM | POA: Diagnosis not present

## 2017-12-08 HISTORY — PX: GIVENS CAPSULE STUDY: SHX5432

## 2017-12-08 SURGERY — IMAGING PROCEDURE, GI TRACT, INTRALUMINAL, VIA CAPSULE

## 2017-12-13 ENCOUNTER — Other Ambulatory Visit: Payer: Self-pay

## 2017-12-14 ENCOUNTER — Encounter: Payer: Self-pay | Admitting: Gastroenterology

## 2017-12-15 DIAGNOSIS — L538 Other specified erythematous conditions: Secondary | ICD-10-CM | POA: Diagnosis not present

## 2017-12-15 DIAGNOSIS — L820 Inflamed seborrheic keratosis: Secondary | ICD-10-CM | POA: Diagnosis not present

## 2017-12-15 DIAGNOSIS — Z86008 Personal history of in-situ neoplasm of other site: Secondary | ICD-10-CM | POA: Diagnosis not present

## 2017-12-20 ENCOUNTER — Encounter (INDEPENDENT_AMBULATORY_CARE_PROVIDER_SITE_OTHER): Payer: Self-pay | Admitting: Orthopaedic Surgery

## 2017-12-20 ENCOUNTER — Ambulatory Visit (INDEPENDENT_AMBULATORY_CARE_PROVIDER_SITE_OTHER): Payer: Medicare Other | Admitting: Orthopaedic Surgery

## 2017-12-20 ENCOUNTER — Ambulatory Visit (INDEPENDENT_AMBULATORY_CARE_PROVIDER_SITE_OTHER): Payer: Medicare Other

## 2017-12-20 DIAGNOSIS — Z96642 Presence of left artificial hip joint: Secondary | ICD-10-CM

## 2017-12-20 NOTE — Progress Notes (Signed)
The patient is now 8 months status post a left total hip arthroplasty.  She is doing well overall.  She says that she does get some stiffness from time to time and wants to go ahead and start taking glucosamine again for her other joints.  She has no issues she states.  She is very active 84 years old.  She is walking without an assistive device or limp.  On exam I can easily put her left hip to internal and external rotation with no issues at all.  There is no pain.  X-rays confirm well-seated total hip arthroplasty on the left side with no complicating features.  This point all question concerns were answered and addressed.  Follow-up will be as needed.

## 2017-12-21 ENCOUNTER — Other Ambulatory Visit: Payer: Self-pay | Admitting: Oncology

## 2017-12-21 DIAGNOSIS — K6289 Other specified diseases of anus and rectum: Secondary | ICD-10-CM | POA: Diagnosis not present

## 2017-12-22 ENCOUNTER — Inpatient Hospital Stay: Payer: Medicare Other | Attending: Oncology

## 2017-12-22 DIAGNOSIS — D509 Iron deficiency anemia, unspecified: Secondary | ICD-10-CM | POA: Diagnosis not present

## 2017-12-22 LAB — CBC WITH DIFFERENTIAL/PLATELET
Basophils Absolute: 0.1 10*3/uL (ref 0–0.1)
Basophils Relative: 1 %
EOS PCT: 3 %
Eosinophils Absolute: 0.2 10*3/uL (ref 0–0.7)
HCT: 37.4 % (ref 35.0–47.0)
Hemoglobin: 12.3 g/dL (ref 12.0–16.0)
LYMPHS ABS: 1.2 10*3/uL (ref 1.0–3.6)
LYMPHS PCT: 23 %
MCH: 26.6 pg (ref 26.0–34.0)
MCHC: 33 g/dL (ref 32.0–36.0)
MCV: 80.6 fL (ref 80.0–100.0)
Monocytes Absolute: 0.5 10*3/uL (ref 0.2–0.9)
Monocytes Relative: 8 %
Neutro Abs: 3.6 10*3/uL (ref 1.4–6.5)
Neutrophils Relative %: 65 %
PLATELETS: 159 10*3/uL (ref 150–440)
RBC: 4.64 MIL/uL (ref 3.80–5.20)
RDW: 20.1 % — ABNORMAL HIGH (ref 11.5–14.5)
WBC: 5.5 10*3/uL (ref 3.6–11.0)

## 2017-12-22 LAB — FERRITIN: Ferritin: 67 ng/mL (ref 11–307)

## 2017-12-22 LAB — IRON AND TIBC
Iron: 43 ug/dL (ref 28–170)
Saturation Ratios: 12 % (ref 10.4–31.8)
TIBC: 352 ug/dL (ref 250–450)
UIBC: 309 ug/dL

## 2017-12-23 ENCOUNTER — Encounter: Payer: Self-pay | Admitting: Oncology

## 2017-12-23 ENCOUNTER — Inpatient Hospital Stay (HOSPITAL_BASED_OUTPATIENT_CLINIC_OR_DEPARTMENT_OTHER): Payer: Medicare Other | Admitting: Oncology

## 2017-12-23 ENCOUNTER — Inpatient Hospital Stay: Payer: Medicare Other

## 2017-12-23 ENCOUNTER — Other Ambulatory Visit: Payer: Self-pay

## 2017-12-23 VITALS — BP 111/65 | HR 93 | Temp 96.2°F | Resp 18 | Wt 177.8 lb

## 2017-12-23 DIAGNOSIS — D509 Iron deficiency anemia, unspecified: Secondary | ICD-10-CM

## 2017-12-23 MED ORDER — DOCUSATE SODIUM 100 MG PO CAPS: 100.0000 mg | ORAL_CAPSULE | Freq: Every day | ORAL | 3 refills | Status: DC

## 2017-12-23 MED ORDER — FERROUS SULFATE 325 (65 FE) MG PO TBEC: 325.0000 mg | DELAYED_RELEASE_TABLET | Freq: Every day | ORAL | 3 refills | Status: DC

## 2017-12-23 NOTE — Progress Notes (Signed)
Patient here for follow up. Pt states feeling fine.

## 2017-12-23 NOTE — Progress Notes (Signed)
No treatment today per MD.  

## 2017-12-24 NOTE — Progress Notes (Signed)
Hematology/Oncology Consult note [facility] Telephone:([phone] [phone]   Patient Care Team: Tower, Wynelle Fanny, MD as PCP - General  REFERRING PROVIDER: Dr.Anna CHIEF COMPLAINTS/REASON FOR VISIT:  Evaluation of iron deficiency anemia.   HISTORY OF PRESENTING ILLNESS:  [name] is a  84 y.o.  female with PMH listed below who was referred to me for evaluation of iron deficiency anemia.  Patient reports having worsening of fatigue since her hip surgery on 05/04/2017.  Chronic A fib, on  coumadin.  09/20/2017 She presented to ER for rectal bleeding.  Lab work up showed hemoglobin 11.3, with MCV 77,  10/13/2017 Colonoscopy showed multiple polyps in the transverse colon and in the ascending colon, removed.  Submucosal nodule in distal rectum.  Upper endoscopy: normal esophagus, hiatal hernia, otherwise normal.  Pathology showed tubular adenoma. Negative for malignancy.  Duodenum biopsy negative for intraepithelial lymphocytosis, dysplasia and malignancy.   Further work up includes iron panel which was obtain on 10/14/2017. Consistent with iron deficiency anemia.  History of iron deficiency: denies Rectal bleeding: yes Menstrual bleeding/ Vaginal bleeding : post menopausal Hematemesis or hemoptysis : denies Blood in urine : denies  Pica: denies Fatigue: Yes.  SOB: deneis  INTERVAL HISTORY [name] is a 84 y.o. female who has above history reviewed by me today presents for follow up visit for management of IDA S/p  Venofer 200mg  weekly x 4 doses. Reports feeling fatigue is much better.  Capsule study 12/10/2017 showed a few non bleeding AVM.   Review of Systems  Constitutional: Negative for chills, fever, malaise/fatigue and weight loss.  HENT: Negative for nosebleeds and sore throat.   Eyes: Negative for double vision, photophobia and redness.  Respiratory: Negative for cough, shortness of breath and wheezing.   Cardiovascular: Negative  for chest pain, palpitations and orthopnea.  Gastrointestinal: Negative for abdominal pain, blood in stool, nausea and vomiting.  Genitourinary: Negative for dysuria.  Musculoskeletal: Negative for back pain, myalgias and neck pain.  Skin: Negative for itching and rash.  Neurological: Negative for dizziness, tingling and tremors.  Endo/Heme/Allergies: Negative for environmental allergies. Does not bruise/bleed easily.  Psychiatric/Behavioral: Negative for depression.    MEDICAL HISTORY:  Past Medical History:  Diagnosis Date  . Allergic rhinitis   . 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 Arthritis   . Atrial fibrillation -persistent cardiologist-  dr klein/  primary EP -- dr Tawanna Sat ([facility])   a. s/p PVI [name] 2010;  b. on tikosyn/coumadin;  c. 05/2009 Echo: EF 60-65%, Gr 2 DD. (first dx 09/ 2007)  . Bilateral lower extremity edema   . Bleeding hemorrhoid   . Carotid stenosis    mild (hosp 3/11)- consult by vasc/ Dr Donnetta Hutching  . Complication of anesthesia    hard to wake  . Diverticulosis of colon   . Dyspnea    on exertion-climbing stairs  . Dysrhythmia   . Fatty liver   . H/O cardiac radiofrequency ablation    01/ 2008 at [facility] /  03/ 2010  at [facility]  . Heart failure with preserved ejection fraction ([place])   . Heart murmur    "prior to valve repair"  . History of adenomatous polyp of colon    tubular adenoma's  . History of cardiomyopathy    secondary tachycardia-induced cardiomyopathy -- resolved 2014  . History of squamous cell carcinoma in situ (SCCIS) of skin    05/ 2017  nasal bridge and right medial knee  .  History of transient ischemic attack (TIA)    01-24-2005 and 06-12-2009  . Hyperlipidemia   . Hypothyroidism   . Mild intermittent asthma    reacts to cats  . Mixed stress and urge urinary incontinence   . Pulmonary nodule   . S/P mitral valve repair 10-23-1998  dr Boyce Medici at [facility]   for MVP and regurg. (annuloplasty ring procedure)  .  Swelling of left extremity 2017   states it's gotten worse  . Varicose vein of leg   . Wears glasses     SURGICAL HISTORY: Past Surgical History:  Procedure Laterality Date  . APPENDECTOMY  1978  . CARDIAC ELECTROPHYSIOLOGY [place] AND ABLATION  01/ 2008    at [place]   right-sided ablation atrial flutter  . CARDIAC ELECTROPHYSIOLOGY STUDY AND ABLATION  03/ 2010   dr Jaymes Graff at [facility]   AV node ablation and pulmonary vein isolation for atrial fib  . CARDIOVERSION  06-18-2006;  07-13-2006;  10-19-2010;  10-27-2010  . COLONOSCOPY    . COLONOSCOPY WITH PROPOFOL N/A 10/13/2017   Procedure: COLONOSCOPY WITH PROPOFOL;  Surgeon: [name], MD;  Location: [facility] ENDOSCOPY;  Service: Gastroenterology;  Laterality: N/A;  . CYSTO/ TRANSURETHRAL COLLAGEN INJECTION THERAPY  07-26-2007   dr Matilde Sprang  . DILATION AND CURETTAGE OF UTERUS    . ESOPHAGOGASTRODUODENOSCOPY (EGD) WITH PROPOFOL N/A 10/13/2017   Procedure: ESOPHAGOGASTRODUODENOSCOPY (EGD) WITH PROPOFOL;  Surgeon: [name], MD;  Location: [facility] ENDOSCOPY;  Service: Gastroenterology;  Laterality: N/A;  . EXCISIONAL HEMORRHOIDECTOMY  1980s  . GIVENS CAPSULE STUDY N/A 12/08/2017   Procedure: GIVENS CAPSULE STUDY;  Surgeon: [name], MD;  Location: [facility] ENDOSCOPY;  Service: Gastroenterology;  Laterality: N/A;  . HEMORRHOID SURGERY N/A 10/29/2016   Procedure: HEMORRHOIDECTOMY;  Surgeon: Leighton Ruff, MD;  Location: [facility];  Service: General;  Laterality: N/A;  . MITRAL VALVE ANNULOPLASTY  10/23/1998   "Model 4625; [employer] [id number]"; size 33mm; [facility]; Dr. Boyce Medici  . PILONIDAL CYST EXCISION  1954  . TEE WITH CARDIOVERSION  05-06-2006 at [facility];  01-02-2013 at [facility]  . TOTAL HIP ARTHROPLASTY Left 05/04/2017   Procedure: LEFT TOTAL HIP ARTHROPLASTY ANTERIOR APPROACH;  Surgeon: Mcarthur Rossetti, MD;  Location: [place];  Service: Orthopedics;  Laterality: Left;  . TRANSTHORACIC ECHOCARDIOGRAM  05-01-2015    dr Caryl Comes   ef 50-55%/  mild AV sclerosis without stenosis/  post MV repair with mild central MR (valve area by pressure half-time 2cm^2,  valve area by continutity equation 0.91cm^2, peak grandiant 8mmHg)/  severe LAE/ mild TR/ mild RAE   . TUBAL LIGATION Bilateral 1978    SOCIAL HISTORY: Social History   Socioeconomic History  . Marital status: Widowed    Spouse name: Not on file  . Number of children: 6  . Years of education: Not on file  . Highest education level: Not on file  Occupational History  . Occupation: Architectural technologist: RETIRED  Social Needs  . Financial resource strain: Not on file  . Food insecurity:    Worry: Not on file    Inability: Not on file  . Transportation needs:    Medical: Not on file    Non-medical: Not on file  Tobacco Use  . Smoking status: Never Smoker  . Smokeless tobacco: Never Used  Substance and Sexual Activity  . Alcohol use: Yes    Alcohol/week: 0.0 standard drinks    Comment: seldom  . Drug use: No  . Sexual activity: Not  Currently  Lifestyle  . Physical activity:    Days per week: Not on file    Minutes per session: Not on file  . Stress: Not on file  Relationships  . Social connections:    Talks on phone: Not on file    Gets together: Not on file    Attends religious service: Not on file    Active member of club or organization: Not on file    Attends meetings of clubs or organizations: Not on file    Relationship status: Not on file  . Intimate partner violence:    Fear of current or ex partner: Not on file    Emotionally abused: Not on file    Physically abused: Not on file    Forced sexual activity: Not on file  Other Topics Concern  . Not on file  Social History Narrative   Retired. Daily Caffeine use: 2 daily     FAMILY HISTORY: Family History  Problem Relation Age of Onset  . Lung cancer Father        smoker  . Alcohol abuse Father   . Cancer Father        bladder and lung CA smoker  . Breast cancer Neg  Hx     ALLERGIES:  is allergic to amiodarone hcl; penicillins; and statins.  MEDICATIONS:  Current Outpatient Medications  Medication Sig Dispense Refill  . albuterol (PROVENTIL HFA;VENTOLIN HFA) 108 (90 Base) MCG/ACT inhaler Inhale 2 puffs into the lungs every 4 (four) hours as needed for wheezing or shortness of breath. 1 Inhaler 0  . alendronate (FOSAMAX) 70 MG tablet Take 1 tablet (70 mg total) by mouth every 7 (seven) days. Take with a full glass of water on an empty stomach. 4 tablet 11  . Cholecalciferol (VITAMIN D3) 2000 units TABS Take 4,000 Units by mouth daily.    . clindamycin (CLEOCIN) 150 MG capsule TAKE 4 CAPSULES BY MOUTH 1 HR PRIOR TO DENTAL APPT  0  . diltiazem (CARTIA XT) 180 MG 24 hr capsule Take 1 capsule (180 mg total) by mouth daily. 90 capsule 3  . diphenhydrAMINE (BENADRYL) 25 mg capsule Take 50 mg by mouth every 6 (six) hours as needed for itching.    . fluticasone (FLONASE) 50 MCG/ACT nasal spray Place 1 spray into both nostrils daily as needed for allergies.    . furosemide (LASIX) 40 MG tablet TAKE 1/2 TABLET BY MOUTH DAILY, MAY INCREASE TO 1 TABLET BY MOUTH DAILY IF NEEDED (Patient taking differently: Take 20 mg by mouth daily.) 90 tablet 2  . levothyroxine (SYNTHROID, LEVOTHROID) 25 MCG tablet TAKE 1 TABLET EVERY DAY BEFORE BREAKFAST 90 tablet 1  . linaclotide (LINZESS) 72 MCG capsule Take 1 capsule (72 mcg total) by mouth daily before breakfast. 30 capsule 3  . loratadine (CLARITIN) 10 MG tablet Take 10 mg by mouth daily as needed for allergies.     . metoprolol tartrate (LOPRESSOR) 25 MG tablet TAKE 1 TABLET (25 MG TOTAL) BY MOUTH 3 (THREE) TIMES DAILY AS NEEDED. FOR FAST HEART RATE 60 tablet 5  . Polyethyl Glycol-Propyl Glycol (LUBRICANT EYE DROPS) 0.4-0.3 % SOLN Place 1-2 drops into both eyes 3 (three) times daily as needed (for dry eyes.).    [name] warfarin (COUMADIN) 5 MG tablet Take 1 tablet daily except 1/2 on Monday Wed and Fri or TAKE AS DIRECTED BY ANTI  COAGULATION CLINIC 105 tablet 1  . docusate sodium (COLACE) 100 MG capsule Take 1 capsule (100 mg total)  by mouth daily. 30 capsule 3  . enoxaparin (LOVENOX) 120 MG/0.8ML injection Inject 0.8 mLs (120 mg total) into the skin daily. (Patient not taking: Reported on 12/23/2017) 6 Syringe 0  . ferrous sulfate 325 (65 FE) MG EC tablet Take 1 tablet (325 mg total) by mouth daily with breakfast. 30 tablet 3   No current facility-administered medications for this visit.      PHYSICAL EXAMINATION: ECOG PERFORMANCE STATUS: 1 - Symptomatic but completely ambulatory Vitals:   12/23/17 1350  BP: 111/65  Pulse: 93  Resp: 18  Temp: (!) 96.2 F (35.7 C)   Filed Weights   12/23/17 1350  Weight: 177 lb 12.8 oz (80.6 kg)    Physical Exam  Constitutional: She is oriented to person, place, and time. No distress.  HENT:  Head: Normocephalic and atraumatic.  Mouth/Throat: Oropharynx is clear and moist.  Eyes: Pupils are equal, round, and reactive to light. EOM are normal. No scleral icterus.  Neck: Normal range of motion. Neck supple.  Cardiovascular: Normal rate, regular rhythm and normal heart sounds.  Pulmonary/Chest: Effort normal and breath sounds normal. No respiratory distress. She has no wheezes.  Abdominal: Soft. Bowel sounds are normal. She exhibits no distension and no mass. There is no tenderness.  Musculoskeletal: Normal range of motion. She exhibits no edema or deformity.  Neurological: She is alert and oriented to person, place, and time. No cranial nerve deficit. Coordination normal.  Skin: Skin is warm and dry. No rash noted. No erythema.  Psychiatric: She has a normal mood and affect. Her behavior is normal. Thought content normal.     LABORATORY DATA:  I have reviewed the data as listed Lab Results  Component Value Date   WBC 5.5 12/22/2017   HGB 12.3 12/22/2017   HCT 37.4 12/22/2017   MCV 80.6 12/22/2017   PLT 159 12/22/2017   Recent Labs    05/07/17 0735  07/02/17 1344 08/02/17 1146 09/01/17 1111 09/20/17 1026  NA 139 140 140 139 140  K 4.1 3.6 4.2 3.8 3.7  CL 100* 104 102 100 106  CO2 27 29 31 31 26   GLUCOSE 101* 96 96 93 121*  BUN 12 20 23 22 17   CREATININE 0.74 0.75 0.80 0.80 0.63  CALCIUM 8.0* 9.2 9.2 9.5 8.6*  GFRNONAA >60 >60  --   --  >60  GFRAA >60 >60  --   --  >60  PROT  --   --  7.3 7.5 7.5  ALBUMIN  --   --  4.1 4.1 3.9  AST  --   --  17 17 21   ALT  --   --  10 12 12   ALKPHOS  --   --  44 57 60  BILITOT  --   --  0.6 0.6 0.8   Iron/TIBC/Ferritin/ %Sat    Component Value Date/Time   IRON 43 12/22/2017 1107   TIBC 352 12/22/2017 1107   FERRITIN 67 12/22/2017 1107   IRONPCTSAT 12 12/22/2017 1107        ASSESSMENT & PLAN:  1. Iron deficiency anemia, unspecified iron deficiency anemia type    Labs were reviewed and discussed with patient. Iron store and hemoglobin with improvement.  Hold additional IV iron.  Recommend patient to take oral ferrous sulfate 325mg  daily   Repeat labs in 4 months. If iron store drops again, will need to be re-evaluated for ablasion  Orders Placed This Encounter  Procedures  . CBC with Differential/Platelet  Standing Status:   Future    Standing Expiration Date:   12/24/2018  . Ferritin    Standing Status:   Future    Standing Expiration Date:   12/24/2018  . Iron and TIBC    Standing Status:   Future    Standing Expiration Date:   12/24/2018    All questions were answered. The patient knows to call the clinic with any problems questions or concerns.  Return of visit: 4 months. Total face to face encounter time for this patient visit was 61min. >50% of the time was  spent in counseling and coordination of care.   Earlie Server, MD, PhD Hematology Oncology [facility] Pager- [phone] 12/24/2017

## 2017-12-27 ENCOUNTER — Other Ambulatory Visit: Payer: Self-pay | Admitting: Internal Medicine

## 2017-12-27 NOTE — Telephone Encounter (Signed)
Pt's pharmacy is requesting a refill on levothyroxine. Would Dr. Klein like to refill this medication? Please address 

## 2018-01-06 ENCOUNTER — Ambulatory Visit (INDEPENDENT_AMBULATORY_CARE_PROVIDER_SITE_OTHER): Payer: Medicare Other | Admitting: General Practice

## 2018-01-06 DIAGNOSIS — I4891 Unspecified atrial fibrillation: Secondary | ICD-10-CM | POA: Diagnosis not present

## 2018-01-06 LAB — POCT INR: INR: 2.5 (ref 2.0–3.0)

## 2018-01-06 NOTE — Patient Instructions (Signed)
Pre visit review using our clinic review tool, if applicable. No additional management support is needed unless otherwise documented below in the visit note.  Continue to take 1 pill (5mg) daily EXCEPT for 1/2 pill (2.5mg) on Mondays and Fridays.   Re-check in 4 weeks.  

## 2018-01-31 ENCOUNTER — Other Ambulatory Visit: Payer: Self-pay | Admitting: Internal Medicine

## 2018-02-03 ENCOUNTER — Ambulatory Visit (INDEPENDENT_AMBULATORY_CARE_PROVIDER_SITE_OTHER): Payer: Medicare Other | Admitting: General Practice

## 2018-02-03 DIAGNOSIS — Z7901 Long term (current) use of anticoagulants: Secondary | ICD-10-CM

## 2018-02-03 DIAGNOSIS — I4891 Unspecified atrial fibrillation: Secondary | ICD-10-CM

## 2018-02-03 LAB — POCT INR: INR: 2.6 (ref 2.0–3.0)

## 2018-02-03 NOTE — Patient Instructions (Signed)
Pre visit review using our clinic review tool, if applicable. No additional management support is needed unless otherwise documented below in the visit note.  Continue to take 1 pill (5mg ) daily EXCEPT for 1/2 pill (2.5mg ) on Mondays and Fridays.   Re-check in 5 weeks.

## 2018-02-04 ENCOUNTER — Ambulatory Visit (INDEPENDENT_AMBULATORY_CARE_PROVIDER_SITE_OTHER): Payer: Medicare Other | Admitting: Family Medicine

## 2018-02-04 ENCOUNTER — Encounter: Payer: Self-pay | Admitting: Family Medicine

## 2018-02-04 VITALS — BP 126/82 | HR 95 | Temp 97.7°F | Ht 65.0 in | Wt 181.5 lb

## 2018-02-04 DIAGNOSIS — R350 Frequency of micturition: Secondary | ICD-10-CM

## 2018-02-04 DIAGNOSIS — I4891 Unspecified atrial fibrillation: Secondary | ICD-10-CM

## 2018-02-04 DIAGNOSIS — R8290 Unspecified abnormal findings in urine: Secondary | ICD-10-CM | POA: Diagnosis not present

## 2018-02-04 DIAGNOSIS — R42 Dizziness and giddiness: Secondary | ICD-10-CM | POA: Diagnosis not present

## 2018-02-04 DIAGNOSIS — D509 Iron deficiency anemia, unspecified: Secondary | ICD-10-CM | POA: Diagnosis not present

## 2018-02-04 DIAGNOSIS — R000 Tachycardia, unspecified: Secondary | ICD-10-CM

## 2018-02-04 DIAGNOSIS — E039 Hypothyroidism, unspecified: Secondary | ICD-10-CM | POA: Diagnosis not present

## 2018-02-04 DIAGNOSIS — R300 Dysuria: Secondary | ICD-10-CM | POA: Diagnosis not present

## 2018-02-04 LAB — POC URINALSYSI DIPSTICK (AUTOMATED)
Bilirubin, UA: NEGATIVE
GLUCOSE UA: NEGATIVE
KETONES UA: NEGATIVE
Nitrite, UA: NEGATIVE
Protein, UA: POSITIVE — AB
SPEC GRAV UA: 1.025 (ref 1.010–1.025)
UROBILINOGEN UA: 0.2 U/dL
pH, UA: 6 (ref 5.0–8.0)

## 2018-02-04 NOTE — Patient Instructions (Addendum)
Labs today  Urinalysis today (we will culture it and let you know if needed)  If dizziness returns and persists or worsens- call 911 and get to the ER Also chest pain or rapid heart rate   Rest today and get some fluids   Glad you are feeling better
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
## 2018-02-05 LAB — CBC WITH DIFFERENTIAL/PLATELET
BASOS ABS: 61 {cells}/uL (ref 0–200)
Basophils Relative: 1.1 %
EOS ABS: 231 {cells}/uL (ref 15–500)
EOS PCT: 4.2 %
HEMATOCRIT: 40.5 % (ref 35.0–45.0)
Hemoglobin: 13.6 g/dL (ref 11.7–15.5)
LYMPHS ABS: 1986 {cells}/uL (ref 850–3900)
MCH: 26.9 pg — AB (ref 27.0–33.0)
MCHC: 33.6 g/dL (ref 32.0–36.0)
MCV: 80 fL (ref 80.0–100.0)
MONOS PCT: 10.5 %
MPV: 11.4 fL (ref 7.5–12.5)
Neutro Abs: 2646 cells/uL (ref 1500–7800)
Neutrophils Relative %: 48.1 %
Platelets: 181 10*3/uL (ref 140–400)
RBC: 5.06 10*6/uL (ref 3.80–5.10)
RDW: 15.7 % — AB (ref 11.0–15.0)
Total Lymphocyte: 36.1 %
WBC mixed population: 578 cells/uL (ref 200–950)
WBC: 5.5 10*3/uL (ref 3.8–10.8)

## 2018-02-05 LAB — URINE CULTURE
MICRO NUMBER: 91378991
SPECIMEN QUALITY: ADEQUATE

## 2018-02-05 LAB — COMPREHENSIVE METABOLIC PANEL
AG RATIO: 1.4 (calc) (ref 1.0–2.5)
ALKALINE PHOSPHATASE (APISO): 59 U/L (ref 33–130)
ALT: 11 U/L (ref 6–29)
AST: 17 U/L (ref 10–35)
Albumin: 4.3 g/dL (ref 3.6–5.1)
BILIRUBIN TOTAL: 0.5 mg/dL (ref 0.2–1.2)
BUN: 23 mg/dL (ref 7–25)
CALCIUM: 9.2 mg/dL (ref 8.6–10.4)
CHLORIDE: 103 mmol/L (ref 98–110)
CO2: 28 mmol/L (ref 20–32)
Creat: 0.87 mg/dL (ref 0.60–0.88)
GLOBULIN: 3 g/dL (ref 1.9–3.7)
Glucose, Bld: 85 mg/dL (ref 65–99)
Potassium: 4.5 mmol/L (ref 3.5–5.3)
Sodium: 141 mmol/L (ref 135–146)
Total Protein: 7.3 g/dL (ref 6.1–8.1)

## 2018-02-05 LAB — TSH: TSH: 4.06 m[IU]/L (ref 0.40–4.50)

## 2018-02-06 DIAGNOSIS — R300 Dysuria: Secondary | ICD-10-CM | POA: Insufficient documentation

## 2018-02-06 NOTE — Assessment & Plan Note (Signed)
Nl cbc last check  Has seen hematology-iron infusions  Episode of dizziness today Re check cbc

## 2018-02-06 NOTE — Assessment & Plan Note (Signed)
occ dysuria  UA with some leukocytes  Culture pending  Enc water intake

## 2018-02-06 NOTE — Assessment & Plan Note (Signed)
Recent episode of dizziness (with elevated HR intermitently) TSH today

## 2018-02-06 NOTE — Assessment & Plan Note (Signed)
HR elevated during dizzy spell- but may have been reactive/anxiety  Now feeling better and rate of 90

## 2018-02-06 NOTE — Assessment & Plan Note (Signed)
Episode of dizziness (spinning) -causing pt to pull car over today and call EMS Re assuring eval (though HR was up)  Lasted 15 min approx  Improved now  Reassuring exam  Suspect this was an episode of vertigo  If re occurs however-consider CT scan /further eval Labs today

## 2018-02-06 NOTE — Assessment & Plan Note (Signed)
Stable EKG today  HR was 135 when she had dizzy spell- this may have been rxn to dizziness however  Feeling better now

## 2018-02-14 ENCOUNTER — Encounter: Payer: Self-pay | Admitting: Gastroenterology

## 2018-02-14 ENCOUNTER — Ambulatory Visit (INDEPENDENT_AMBULATORY_CARE_PROVIDER_SITE_OTHER): Payer: Medicare Other | Admitting: Gastroenterology

## 2018-02-14 VITALS — BP 121/56 | HR 74 | Ht 65.0 in | Wt 182.0 lb

## 2018-02-14 DIAGNOSIS — D509 Iron deficiency anemia, unspecified: Secondary | ICD-10-CM | POA: Diagnosis not present

## 2018-02-14 DIAGNOSIS — D519 Vitamin B12 deficiency anemia, unspecified: Secondary | ICD-10-CM

## 2018-02-14 DIAGNOSIS — D649 Anemia, unspecified: Secondary | ICD-10-CM | POA: Diagnosis not present

## 2018-02-14 NOTE — Progress Notes (Signed)
[name] MD, MRCP(U.K) [address]  [place]  [place], [place] [address]  Main: [phone]  Fax: [phone]   Primary Care Physician: Tower, Wynelle Fanny, MD  Primary Gastroenterologist:  Dr. [name]   No chief complaint on file.   HPI: [name] is a 84 y.o. female   Summary of history :  She is here today to see me for a follow up for iron deficiency anemia,constipation. I had initially seen her back on 09/29/17 for rectal bleeding. She has a history of A fib on coumadin. She presented to the ER on 09/20/17 with painless rectal bleeding.At the ER when she had her labs checked - INR 1.80 . Hb 11.3 with MCV 77, looking back her MCV has gradually decreased over the past 2 months . Prior  colonoscopy was back in 2016 and two tubular adenomas were excised.  Celiac serology, urine for blood- negative.   10/13/17: EGD+ colonoscopy- 12 colon polyps excised , a single submucosal lesion close to anus not taken out . 4 cm hiatal hernia seen on EGD. Duodenal bx were normal , 8 tubular adenomas noted .Repeat colonoscopy in 1 year due to poor prep and multiple polyps.  Interval history  11/11/2017-02/14/18   12/22/17 : Hb 12. 3 grams. She has seen Dr. Tasia Catchings in oncology for IV iron. 12/10/17 : Tiny small bowel in the proximal aspect AVM, non bleeding seen. Delayed passage of capsule through the esophagus 1 minute duration   She says she saw the surgeon for resection of rectal nodule- was told she will be seen again in 6 months presently says "wait and see" is the plan.  She has been scheduled for a capsule endoscopy of her small bowel on 12/08/2017.    Bowel movements back to normal. Using linzess as needed.   Current Outpatient Medications  Medication Sig Dispense Refill  . albuterol (PROVENTIL HFA;VENTOLIN HFA) 108 (90 Base) MCG/ACT inhaler Inhale 2 puffs into the lungs every 4 (four) hours as needed for wheezing or shortness of breath. 1 Inhaler 0  . alendronate (FOSAMAX) 70 MG  tablet Take 1 tablet (70 mg total) by mouth every 7 (seven) days. Take with a full glass of water on an empty stomach. 4 tablet 11  . Cholecalciferol (VITAMIN D3) 2000 units TABS Take 4,000 Units by mouth daily.    . clindamycin (CLEOCIN) 150 MG capsule TAKE 4 CAPSULES BY MOUTH 1 HR PRIOR TO DENTAL APPT  0  . diltiazem (CARTIA XT) 180 MG 24 hr capsule Take 1 capsule (180 mg total) by mouth daily. 90 capsule 3  . diphenhydrAMINE (BENADRYL) 25 mg capsule Take 50 mg by mouth every 6 (six) hours as needed for itching.    . docusate sodium (COLACE) 100 MG capsule Take 1 capsule (100 mg total) by mouth daily. 30 capsule 3  . enoxaparin (LOVENOX) 120 MG/0.8ML injection Inject 0.8 mLs (120 mg total) into the skin daily. 6 Syringe 0  . ferrous sulfate 325 (65 FE) MG EC tablet Take 1 tablet (325 mg total) by mouth daily with breakfast. 30 tablet 3  . fluticasone (FLONASE) 50 MCG/ACT nasal spray Place 1 spray into both nostrils daily as needed for allergies.    . furosemide (LASIX) 40 MG tablet Take 0.5 tablets (20 mg total) by mouth daily. TAKE 1/2 TABLET BY MOUTH DAILY, MAY INCREASE TO 1 TABLET EVERY DAY IF NEEDED 90 tablet 0  . levothyroxine (SYNTHROID, LEVOTHROID) 25 MCG tablet TAKE 1 TABLET EVERY DAY  BEFORE BREAKFAST 90 tablet 1  . linaclotide (LINZESS) 72 MCG capsule Take 1 capsule (72 mcg total) by mouth daily before breakfast. 30 capsule 3  . loratadine (CLARITIN) 10 MG tablet Take 10 mg by mouth daily as needed for allergies.     . metoprolol tartrate (LOPRESSOR) 25 MG tablet TAKE 1 TABLET (25 MG TOTAL) BY MOUTH 3 (THREE) TIMES DAILY AS NEEDED. FOR FAST HEART RATE 60 tablet 5  . Polyethyl Glycol-Propyl Glycol (LUBRICANT EYE DROPS) 0.4-0.3 % SOLN Place 1-2 drops into both eyes 3 (three) times daily as needed (for dry eyes.).    [name] warfarin (COUMADIN) 5 MG tablet Take 1 tablet daily except 1/2 on Monday Wed and Fri or TAKE AS DIRECTED BY ANTI COAGULATION CLINIC 105 tablet 1   No current  facility-administered medications for this visit.     Allergies as of 02/14/2018 - Review Complete 02/04/2018  Allergen Reaction Noted  . Amiodarone hcl Swelling   . Penicillins Rash 04/27/2007  . Statins Rash     ROS:  General: Negative for anorexia, weight loss, fever, chills, fatigue, weakness. ENT: Negative for hoarseness, difficulty swallowing , nasal congestion. CV: Negative for chest pain, angina, palpitations, dyspnea on exertion, peripheral edema.  Respiratory: Negative for dyspnea at rest, dyspnea on exertion, cough, sputum, wheezing.  GI: See history of present illness. GU:  Negative for dysuria, hematuria, urinary incontinence, urinary frequency, nocturnal urination.  Endo: Negative for unusual weight change.    Physical Examination:   There were no vitals taken for this visit.  General: Well-nourished, well-developed in no acute distress.  Eyes: No icterus. Conjunctivae pink. Mouth: Oropharyngeal mucosa moist and pink , no lesions erythema or exudate. Lungs: Clear to auscultation bilaterally. Non-labored. Heart: Regular rate and rhythm, no murmurs rubs or gallops.  Abdomen: Bowel sounds are normal, nontender, nondistended, no hepatosplenomegaly or masses, no abdominal bruits or hernia , no rebound or guarding.   Extremities: No lower extremity edema. No clubbing or deformities. Neuro: Alert and oriented x 3.  Grossly intact. Skin: Warm and dry, no jaundice.   Psych: Alert and cooperative, normal mood and affect.   Imaging Studies: No results found.  Assessment and Plan:   [name] is a 84 y.o. y/o female here to follow up forrectal bleeding, iron deficiency anemia,constipation .   Colonoscopy showed multiple tubular adenomas and a polyp close to the anus that was not resected.  She has been referred for resection of this polyp and follows with [facility] [place] [facility] where she had her hemorrhoids resected. [name]  Upper endoscopy showed a 4 cm hiatal hernia  otherwise was normal.Tiny non bleeding small AVM seen in the proximal small bowel   Plan  1.Check B12 folate  2.Linzess 72 mcg for constipation refill given as working well  3.Since Hb stable- can watch and wait with regards to small bowel AVM. IF has further drop in Hb then can consider baloon enteroscopy to ablate it  4. PCP can follow up with CBC hereafter and refer back if needed.    Dr [name]  MD,MRCP [facility]) Follow up in 1 year

## 2018-02-15 ENCOUNTER — Encounter: Payer: Self-pay | Admitting: Gastroenterology

## 2018-02-15 LAB — B12 AND FOLATE PANEL
Folate: 6.6 ng/mL (ref >3.0)
VITAMIN B 12: 670 pg/mL (ref 232–1245)

## 2018-03-09 ENCOUNTER — Telehealth: Payer: Self-pay | Admitting: Internal Medicine

## 2018-03-09 NOTE — Telephone Encounter (Signed)
Pt calling c/o vertigo, extreme episode about 2 1/2 weeks ago, still feels it now just not as bad. Pt requesting to lower BP med, taking Diltiazem 180mg  1qd in the am. Please advise [phone]

## 2018-03-10 ENCOUNTER — Ambulatory Visit (INDEPENDENT_AMBULATORY_CARE_PROVIDER_SITE_OTHER): Payer: Medicare Other | Admitting: General Practice

## 2018-03-10 DIAGNOSIS — I4891 Unspecified atrial fibrillation: Secondary | ICD-10-CM

## 2018-03-10 DIAGNOSIS — Z7901 Long term (current) use of anticoagulants: Secondary | ICD-10-CM

## 2018-03-10 LAB — POCT INR: INR: 2.3 (ref 2.0–3.0)

## 2018-03-10 NOTE — Telephone Encounter (Signed)
Spoke with pt today regarding her recent episodes of dizziness and vertigo. She describes them as sudden onset (once while driving) and when she initially lays down for the night. She states she feels "like I'm on a ship and moving up and down.) Her PCP OV notes states she suspects vertigo or some type of sinus congestion as the culprit. Pt calls to discuss medications as she was suspicious her BP's have been low. Pt states she doesn't know exactly her BP's but her last two OV vitals show SBP in the 120's with HR controlled in the 70's. I offered to discuss decreasing her diltiazem with Dr Caryl Comes to see if this gives her some relief. She declined and would like to follow up with her PCP first. She states she did not want to change up her heart medications without ruling out some type of head congestion or seeing her ENT.   I advised pt to call back if her vertigo continues and we can move her appointment up.  Pt verbalized understanding and had no additional questions.

## 2018-03-10 NOTE — Patient Instructions (Signed)
Pre visit review using our clinic review tool, if applicable. No additional management support is needed unless otherwise documented below in the visit note.  Continue to take 1 pill (5mg ) daily EXCEPT for 1/2 pill (2.5mg ) on Mondays and Fridays.   Re-check in 5 weeks.

## 2018-03-21 ENCOUNTER — Other Ambulatory Visit: Payer: Self-pay | Admitting: Family Medicine

## 2018-03-21 NOTE — Telephone Encounter (Signed)
Routing to pt's coumadin nurse

## 2018-04-12 ENCOUNTER — Emergency Department: Payer: Medicare Other

## 2018-04-12 ENCOUNTER — Ambulatory Visit: Payer: Self-pay

## 2018-04-12 ENCOUNTER — Other Ambulatory Visit: Payer: Self-pay

## 2018-04-12 ENCOUNTER — Emergency Department
Admission: EM | Admit: 2018-04-12 | Discharge: 2018-04-12 | Disposition: A | Discharge status: Home / Self Care | Payer: Medicare Other | Attending: Emergency Medicine | Admitting: Emergency Medicine

## 2018-04-12 DIAGNOSIS — Z79899 Other long term (current) drug therapy: Secondary | ICD-10-CM | POA: Diagnosis not present

## 2018-04-12 DIAGNOSIS — R42 Dizziness and giddiness: Secondary | ICD-10-CM

## 2018-04-12 DIAGNOSIS — E039 Hypothyroidism, unspecified: Secondary | ICD-10-CM | POA: Diagnosis not present

## 2018-04-12 DIAGNOSIS — R209 Unspecified disturbances of skin sensation: Secondary | ICD-10-CM

## 2018-04-12 DIAGNOSIS — R202 Paresthesia of skin: Secondary | ICD-10-CM

## 2018-04-12 DIAGNOSIS — J4520 Mild intermittent asthma, uncomplicated: Secondary | ICD-10-CM | POA: Insufficient documentation

## 2018-04-12 DIAGNOSIS — Z85828 Personal history of other malignant neoplasm of skin: Secondary | ICD-10-CM | POA: Insufficient documentation

## 2018-04-12 DIAGNOSIS — Z7901 Long term (current) use of anticoagulants: Secondary | ICD-10-CM | POA: Insufficient documentation

## 2018-04-12 DIAGNOSIS — R200 Anesthesia of skin: Secondary | ICD-10-CM | POA: Diagnosis not present

## 2018-04-12 DIAGNOSIS — R0989 Other specified symptoms and signs involving the circulatory and respiratory systems: Secondary | ICD-10-CM | POA: Diagnosis not present

## 2018-04-12 LAB — CBC
HCT: 39.3 % (ref 36.0–46.0)
Hemoglobin: 12.4 g/dL (ref 12.0–15.0)
MCH: 27.5 pg (ref 26.0–34.0)
MCHC: 31.6 g/dL (ref 30.0–36.0)
MCV: 87.1 fL (ref 80.0–100.0)
Platelets: 175 10*3/uL (ref 150–400)
RBC: 4.51 MIL/uL (ref 3.87–5.11)
RDW: 15.3 % (ref 11.5–15.5)
WBC: 4.7 10*3/uL (ref 4.0–10.5)
nRBC: 0 % (ref 0.0–0.2)

## 2018-04-12 LAB — TROPONIN I: Troponin I: <0.03 ng/mL (ref <0.03)

## 2018-04-12 LAB — BASIC METABOLIC PANEL
Anion gap: 8 (ref 5–15)
BUN: 20 mg/dL (ref 8–23)
CALCIUM: 9.1 mg/dL (ref 8.9–10.3)
CO2: 29 mmol/L (ref 22–32)
Chloride: 104 mmol/L (ref 98–111)
Creatinine, Ser: 0.83 mg/dL (ref 0.44–1.00)
GFR calc non Af Amer: >60 mL/min (ref >60)
Glucose, Bld: 91 mg/dL (ref 70–99)
Potassium: 3.7 mmol/L (ref 3.5–5.1)
Sodium: 141 mmol/L (ref 135–145)

## 2018-04-12 LAB — PROTIME-INR
INR: 2.43
Prothrombin Time: 26.1 seconds — ABNORMAL HIGH (ref 11.4–15.2)

## 2018-04-12 MED ORDER — MECLIZINE HCL 25 MG PO TABS: 25.0000 mg | ORAL_TABLET | Freq: Three times a day (TID) | ORAL | 0 refills | Status: DC | PRN

## 2018-04-12 MED ORDER — SODIUM CHLORIDE 0.9% FLUSH: 3.0000 mL | Freq: Once | INTRAVENOUS | Status: DC

## 2018-04-12 NOTE — Telephone Encounter (Signed)
I will watch for ED report/notes

## 2018-04-12 NOTE — ED Notes (Signed)
Water and crackers given OK per EDP.

## 2018-04-12 NOTE — ED Notes (Signed)
MRI on phone with pt.

## 2018-04-12 NOTE — ED Notes (Signed)
Decreased sensation noted to left side of face.

## 2018-04-12 NOTE — ED Notes (Signed)
Pt given remote and understands waiting on MRI results.

## 2018-04-12 NOTE — ED Notes (Signed)
Patient transported to MRI 
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
## 2018-04-12 NOTE — Telephone Encounter (Signed)
Unable to reach pt, pts daughter or pts son on any contact #; left v/m for pt to cb ASAP. I spoke with Dr Damita Dunnings who had available appt this afternoon and Dr Damita Dunnings said pt with numbness from lt cheek bone to jaw, vision problem and pain under lt shoulder blade needs to go to ED for eval. I spoke with pt; pt did not hear her phone because Pt went to sleep and just woke up; pt has been under a lot of stress due to the move; pt said less numbness in face and shoulder blade is not hurting. Still some issues with shadowing in lt eye. Pt has not had dizziness today. Pt is going to [facility] ED for eval. FYI to Dr Damita Dunnings and Dr Glori Bickers as PCP.

## 2018-04-12 NOTE — ED Triage Notes (Signed)
FIRST NURSE NOTE-left facial numbness starting last night. Also c/o left shoulder pain and scapula pain. Ambulatory without difficulty. Clear speech. Pulled next for triage.

## 2018-04-12 NOTE — Telephone Encounter (Signed)
Pt called stating that she got up today with tingling to her face on the left side she states it is her cheek to jaw line and around her lip.  She states she has been moving and lifting.  She was fine when she went to bed last night.  She is alert oriented. She states she may have a little shadowing to the left eye but states she wears contacts and it could just be the contact. She denies chest pain SOB.  She is having some pain under her shoulder blade on the left side.Per protocol pt should be see today. Care advice read to patient including worsening neurologic symptoms to go to ED. Pt verbalized understanding of all instruction and will be available for appointment today. Please call [phone].   Reason for Disposition . [1] Weakness of the face, arm / hand, or leg / foot on one side of the body AND [2] gradual onset (e.g., days to weeks) AND [3] present now  Answer Assessment - Initial Assessment Questions 1. SYMPTOM: "What is the main symptom you are concerned about?" (e.g., weakness, numbness)     Numbness to left face cheek bone to jaw around mouth 2. ONSET: "When did this start?" (minutes, hours, days; while sleeping)     This AM 3. LAST NORMAL: "When was the last time you were normal (no symptoms)?"     Last night 4. PATTERN "Does this come and go, or has it been constant since it started?"  "Is it present now?"     Present now 5. CARDIAC SYMPTOMS: "Have you had any of the following symptoms: chest pain, difficulty breathing, palpitations?"     no 6. NEUROLOGIC SYMPTOMS: "Have you had any of the following symptoms: headache, dizziness, vision loss, double vision, changes in speech, unsteady on your feet?"     Pain to shoulder blade left back, shadowing in left, has hx of vertigo 7. OTHER SYMPTOMS: "Do you have any other symptoms?"     no 8. PREGNANCY: "Is there any chance you are pregnant?" "When was your last menstrual period?"     N/A  Protocols used: NEUROLOGIC  DEFICIT-A-AH

## 2018-04-12 NOTE — ED Notes (Signed)
Sandwich tray given 

## 2018-04-13 NOTE — Telephone Encounter (Signed)
Noted. Thanks.

## 2018-04-21 ENCOUNTER — Ambulatory Visit (INDEPENDENT_AMBULATORY_CARE_PROVIDER_SITE_OTHER): Payer: Medicare Other | Admitting: General Practice

## 2018-04-21 DIAGNOSIS — I4891 Unspecified atrial fibrillation: Secondary | ICD-10-CM

## 2018-04-21 DIAGNOSIS — Z7901 Long term (current) use of anticoagulants: Secondary | ICD-10-CM

## 2018-04-21 LAB — POCT INR: INR: 2.7 (ref 2.0–3.0)

## 2018-04-21 NOTE — Patient Instructions (Signed)
Pre visit review using our clinic review tool, if applicable. No additional management support is needed unless otherwise documented below in the visit note.  Continue to take 1 pill (5mg) daily EXCEPT for 1/2 pill (2.5mg) on Mondays and Fridays.   Re-check in 6 weeks.    

## 2018-04-28 ENCOUNTER — Ambulatory Visit: Payer: Medicare Other

## 2018-04-28 ENCOUNTER — Ambulatory Visit: Payer: Medicare Other | Admitting: Oncology

## 2018-04-28 ENCOUNTER — Inpatient Hospital Stay: Payer: Medicare Other | Attending: Oncology

## 2018-04-28 DIAGNOSIS — I4891 Unspecified atrial fibrillation: Secondary | ICD-10-CM | POA: Diagnosis not present

## 2018-04-28 DIAGNOSIS — Z7901 Long term (current) use of anticoagulants: Secondary | ICD-10-CM | POA: Insufficient documentation

## 2018-04-28 DIAGNOSIS — D509 Iron deficiency anemia, unspecified: Secondary | ICD-10-CM | POA: Insufficient documentation

## 2018-04-28 LAB — CBC WITH DIFFERENTIAL/PLATELET
Abs Immature Granulocytes: 0.01 10*3/uL (ref 0.00–0.07)
Basophils Absolute: 0 10*3/uL (ref 0.0–0.1)
Basophils Relative: 1 %
Eosinophils Absolute: 0.2 10*3/uL (ref 0.0–0.5)
Eosinophils Relative: 3 %
HCT: 40.5 % (ref 36.0–46.0)
Hemoglobin: 12.6 g/dL (ref 12.0–15.0)
Immature Granulocytes: 0 %
Lymphocytes Relative: 25 %
Lymphs Abs: 1.4 10*3/uL (ref 0.7–4.0)
MCH: 27 pg (ref 26.0–34.0)
MCHC: 31.1 g/dL (ref 30.0–36.0)
MCV: 86.7 fL (ref 80.0–100.0)
Monocytes Absolute: 0.5 10*3/uL (ref 0.1–1.0)
Monocytes Relative: 9 %
Neutro Abs: 3.4 10*3/uL (ref 1.7–7.7)
Neutrophils Relative %: 62 %
Platelets: 183 10*3/uL (ref 150–400)
RBC: 4.67 MIL/uL (ref 3.87–5.11)
RDW: 14.9 % (ref 11.5–15.5)
WBC: 5.4 10*3/uL (ref 4.0–10.5)
nRBC: 0 % (ref 0.0–0.2)

## 2018-04-28 LAB — IRON AND TIBC
Iron: 58 ug/dL (ref 28–170)
Saturation Ratios: 15 % (ref 10.4–31.8)
TIBC: 394 ug/dL (ref 250–450)
UIBC: 336 ug/dL

## 2018-04-28 LAB — FERRITIN: Ferritin: 70 ng/mL (ref 11–307)

## 2018-04-29 ENCOUNTER — Inpatient Hospital Stay: Payer: Medicare Other

## 2018-04-29 ENCOUNTER — Other Ambulatory Visit: Payer: Self-pay

## 2018-04-29 ENCOUNTER — Inpatient Hospital Stay (HOSPITAL_BASED_OUTPATIENT_CLINIC_OR_DEPARTMENT_OTHER): Payer: Medicare Other | Admitting: Oncology

## 2018-04-29 ENCOUNTER — Encounter: Payer: Self-pay | Admitting: Oncology

## 2018-04-29 VITALS — BP 117/80 | HR 96 | Temp 96.7°F | Resp 18 | Wt 179.2 lb

## 2018-04-29 DIAGNOSIS — D509 Iron deficiency anemia, unspecified: Secondary | ICD-10-CM

## 2018-04-29 DIAGNOSIS — Z7901 Long term (current) use of anticoagulants: Secondary | ICD-10-CM

## 2018-04-29 DIAGNOSIS — I4891 Unspecified atrial fibrillation: Secondary | ICD-10-CM

## 2018-04-29 DIAGNOSIS — D500 Iron deficiency anemia secondary to blood loss (chronic): Secondary | ICD-10-CM

## 2018-04-29 NOTE — Progress Notes (Signed)
Patient here for follow up. States she gets tired easily.

## 2018-04-30 NOTE — Progress Notes (Signed)
Hematology/Oncology Consult note [facility] Telephone:([phone] [phone]   Patient Care Team: Tower, Wynelle Fanny, MD as PCP - General  REFERRING PROVIDER: Dr.Anna CHIEF COMPLAINTS/REASON FOR VISIT:  Evaluation of iron deficiency anemia.   HISTORY OF PRESENTING ILLNESS:  [name] is a  85 y.o.  female with PMH listed below who was referred to me for evaluation of iron deficiency anemia.  Patient reports having worsening of fatigue since her hip surgery on 05/04/2017.  Chronic A fib, on  coumadin.  09/20/2017 She presented to ER for rectal bleeding.  Lab work up showed hemoglobin 11.3, with MCV 77,  10/13/2017 Colonoscopy showed multiple polyps in the transverse colon and in the ascending colon, removed.  Submucosal nodule in distal rectum.  Upper endoscopy: normal esophagus, hiatal hernia, otherwise normal.  Pathology showed tubular adenoma. Negative for malignancy.  Duodenum biopsy negative for intraepithelial lymphocytosis, dysplasia and malignancy.   Further work up includes iron panel which was obtain on 10/14/2017. Consistent with iron deficiency anemia.  History of iron deficiency: denies Rectal bleeding: yes Menstrual bleeding/ Vaginal bleeding : post menopausal Hematemesis or hemoptysis : denies Blood in urine : denies  Pica: denies Fatigue: Yes.  SOB: deneis  10/13/2017 colonoscopy showed 12 polyps which were resected and retrieved.  Negative for high-grade dysplasia and malignancy.  Upper endoscopy showed a 4 cm hiatal hernia.  Examination was otherwise normal. Capsule study 12/10/2017 showed a few non bleeding AVM.    INTERVAL HISTORY [name] is a 85 y.o. female who has above history reviewed by me today presents for follow up visit for management of iron deficiency anemia.  Previously received IV Venofer. She feels getting tired easily.  Otherwise doing well.   Review of Systems  Constitutional: Negative for chills, fever,  malaise/fatigue and weight loss.  HENT: Negative for nosebleeds and sore throat.   Eyes: Negative for double vision, photophobia and redness.  Respiratory: Negative for cough, shortness of breath and wheezing.   Cardiovascular: Negative for chest pain, palpitations and orthopnea.  Gastrointestinal: Negative for abdominal pain, blood in stool, nausea and vomiting.  Genitourinary: Negative for dysuria.  Musculoskeletal: Negative for back pain, myalgias and neck pain.  Skin: Negative for itching and rash.  Neurological: Negative for dizziness, tingling and tremors.  Endo/Heme/Allergies: Negative for environmental allergies. Does not bruise/bleed easily.  Psychiatric/Behavioral: Negative for depression.    MEDICAL HISTORY:  Past Medical History:  Diagnosis Date  . Allergic rhinitis   . 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 Arthritis   . Atrial fibrillation -persistent cardiologist-  dr klein/  primary EP -- dr Tawanna Sat ([facility])   a. s/p PVI [name] 2010;  b. on tikosyn/coumadin;  c. 05/2009 Echo: EF 60-65%, Gr 2 DD. (first dx 09/ 2007)  . Bilateral lower extremity edema   . Bleeding hemorrhoid   . Carotid stenosis    mild (hosp 3/11)- consult by vasc/ Dr Donnetta Hutching  . Complication of anesthesia    hard to wake  . Diverticulosis of colon   . Dyspnea    on exertion-climbing stairs  . Dysrhythmia   . Fatty liver   . H/O cardiac radiofrequency ablation    01/ 2008 at [facility] /  03/ 2010  at [facility]  . Heart failure with preserved ejection fraction ([place])   . Heart murmur    "prior to valve repair"  . History of adenomatous polyp of colon    tubular adenoma's  . History of cardiomyopathy  secondary tachycardia-induced cardiomyopathy -- resolved 2014  . History of squamous cell carcinoma in situ (SCCIS) of skin    05/ 2017  nasal bridge and right medial knee  . History of transient ischemic attack (TIA)    01-24-2005 and 06-12-2009  . Hyperlipidemia   . Hypothyroidism   . Mild  intermittent asthma    reacts to cats  . Mixed stress and urge urinary incontinence   . Pulmonary nodule   . S/P mitral valve repair 10-23-1998  dr Boyce Medici at [facility]   for MVP and regurg. (annuloplasty ring procedure)  . Swelling of left extremity 2017   states it's gotten worse  . Varicose vein of leg   . Wears glasses     SURGICAL HISTORY: Past Surgical History:  Procedure Laterality Date  . APPENDECTOMY  1978  . CARDIAC ELECTROPHYSIOLOGY [place] AND ABLATION  01/ 2008    at [place]   right-sided ablation atrial flutter  . CARDIAC ELECTROPHYSIOLOGY STUDY AND ABLATION  03/ 2010   dr Jaymes Graff at [facility]   AV node ablation and pulmonary vein isolation for atrial fib  . CARDIOVERSION  06-18-2006;  07-13-2006;  10-19-2010;  10-27-2010  . COLONOSCOPY    . COLONOSCOPY WITH PROPOFOL N/A 10/13/2017   Procedure: COLONOSCOPY WITH PROPOFOL;  Surgeon: [name], MD;  Location: [facility] ENDOSCOPY;  Service: Gastroenterology;  Laterality: N/A;  . CYSTO/ TRANSURETHRAL COLLAGEN INJECTION THERAPY  07-26-2007   dr Matilde Sprang  . DILATION AND CURETTAGE OF UTERUS    . ESOPHAGOGASTRODUODENOSCOPY (EGD) WITH PROPOFOL N/A 10/13/2017   Procedure: ESOPHAGOGASTRODUODENOSCOPY (EGD) WITH PROPOFOL;  Surgeon: [name], MD;  Location: [facility] ENDOSCOPY;  Service: Gastroenterology;  Laterality: N/A;  . EXCISIONAL HEMORRHOIDECTOMY  1980s  . GIVENS CAPSULE STUDY N/A 12/08/2017   Procedure: GIVENS CAPSULE STUDY;  Surgeon: [name], MD;  Location: [facility] ENDOSCOPY;  Service: Gastroenterology;  Laterality: N/A;  . HEMORRHOID SURGERY N/A 10/29/2016   Procedure: HEMORRHOIDECTOMY;  Surgeon: Leighton Ruff, MD;  Location: [facility];  Service: General;  Laterality: N/A;  . MITRAL VALVE ANNULOPLASTY  10/23/1998   "Model [id number]; [employer] [id number]"; size 70mm; [facility]; Dr. Boyce Medici  . PILONIDAL CYST EXCISION  1954  . TEE WITH CARDIOVERSION  05-06-2006 at [facility];  01-02-2013 at [facility]  . TOTAL  HIP ARTHROPLASTY Left 05/04/2017   Procedure: LEFT TOTAL HIP ARTHROPLASTY ANTERIOR APPROACH;  Surgeon: Mcarthur Rossetti, MD;  Location: [facility];  Service: Orthopedics;  Laterality: Left;  . TRANSTHORACIC ECHOCARDIOGRAM  05-01-2015   dr Caryl Comes   ef 50-55%/  mild AV sclerosis without stenosis/  post MV repair with mild central MR (valve area by pressure half-time 2cm^2,  valve area by continutity equation 0.91cm^2, peak grandiant 36mmHg)/  severe LAE/ mild TR/ mild RAE   . TUBAL LIGATION Bilateral 1978    SOCIAL HISTORY: Social History   Socioeconomic History  . Marital status: Widowed    Spouse name: Not on file  . Number of children: 6  . Years of education: Not on file  . Highest education level: Not on file  Occupational History  . Occupation: Architectural technologist: RETIRED  Social Needs  . Financial resource strain: Not on file  . Food insecurity:    Worry: Not on file    Inability: Not on file  . Transportation needs:    Medical: Not on file    Non-medical: Not on file  Tobacco Use  . Smoking status: Never Smoker  . Smokeless tobacco: Never Used  Substance and Sexual Activity  . Alcohol use: Yes    Alcohol/week: 0.0 standard drinks    Comment: seldom  . Drug use: No  . Sexual activity: Not Currently  Lifestyle  . Physical activity:    Days per week: Not on file    Minutes per session: Not on file  . Stress: Not on file  Relationships  . Social connections:    Talks on phone: Not on file    Gets together: Not on file    Attends religious service: Not on file    Active member of club or organization: Not on file    Attends meetings of clubs or organizations: Not on file    Relationship status: Not on file  . Intimate partner violence:    Fear of current or ex partner: Not on file    Emotionally abused: Not on file    Physically abused: Not on file    Forced sexual activity: Not on file  Other Topics Concern  . Not on file  Social History Narrative   Retired.  Daily Caffeine use: 2 daily     FAMILY HISTORY: Family History  Problem Relation Age of Onset  . Lung cancer Father        smoker  . Alcohol abuse Father   . Cancer Father        bladder and lung CA smoker  . Breast cancer Neg Hx     ALLERGIES:  is allergic to amiodarone hcl; penicillins; and statins.  MEDICATIONS:  Current Outpatient Medications  Medication Sig Dispense Refill  . albuterol (PROVENTIL HFA;VENTOLIN HFA) 108 (90 Base) MCG/ACT inhaler Inhale 2 puffs into the lungs every 4 (four) hours as needed for wheezing or shortness of breath. 1 Inhaler 0  . alendronate (FOSAMAX) 70 MG tablet Take 1 tablet (70 mg total) by mouth every 7 (seven) days. Take with a full glass of water on an empty stomach. 4 tablet 11  . Cholecalciferol (VITAMIN D3) 2000 units TABS Take 4,000 Units by mouth daily.    . clindamycin (CLEOCIN) 150 MG capsule TAKE 4 CAPSULES BY MOUTH 1 HR PRIOR TO DENTAL APPT  0  . diltiazem (CARTIA XT) 180 MG 24 hr capsule Take 1 capsule (180 mg total) by mouth daily. 90 capsule 3  . diphenhydrAMINE (BENADRYL) 25 mg capsule Take 50 mg by mouth every 6 (six) hours as needed for itching.    . ferrous sulfate 325 (65 FE) MG EC tablet Take 1 tablet (325 mg total) by mouth daily with breakfast. 30 tablet 3  . fluticasone (FLONASE) 50 MCG/ACT nasal spray Place 1 spray into both nostrils daily as needed for allergies.    . furosemide (LASIX) 40 MG tablet Take 0.5 tablets (20 mg total) by mouth daily. TAKE 1/2 TABLET BY MOUTH DAILY, MAY INCREASE TO 1 TABLET EVERY DAY IF NEEDED 90 tablet 0  . levothyroxine (SYNTHROID, LEVOTHROID) 25 MCG tablet TAKE 1 TABLET EVERY DAY BEFORE BREAKFAST 90 tablet 1  . loratadine (CLARITIN) 10 MG tablet Take 10 mg by mouth daily as needed for allergies.     [name] meclizine (ANTIVERT) 25 MG tablet Take 1 tablet (25 mg total) by mouth 3 (three) times daily as needed for dizziness. 30 tablet 0  . metoprolol tartrate (LOPRESSOR) 25 MG tablet TAKE 1 TABLET (25 MG  TOTAL) BY MOUTH 3 (THREE) TIMES DAILY AS NEEDED. FOR FAST HEART RATE 60 tablet 5  . Polyethyl Glycol-Propyl Glycol (LUBRICANT EYE DROPS) 0.4-0.3 % SOLN Place  1-2 drops into both eyes 3 (three) times daily as needed (for dry eyes.).    [name] warfarin (COUMADIN) 5 MG tablet Take 1 tablet daily except take 1/2 tablet on Monday and Friday or TAKE AS DIRECTED BY ANTICOAGULATION CLINIC  90 day 90 tablet 1  . docusate sodium (COLACE) 100 MG capsule Take 1 capsule (100 mg total) by mouth daily. (Patient not taking: Reported on 04/29/2018) 30 capsule 3  . enoxaparin (LOVENOX) 120 MG/0.8ML injection Inject 0.8 mLs (120 mg total) into the skin daily. (Patient not taking: Reported on 02/14/2018) 6 Syringe 0  . linaclotide (LINZESS) 72 MCG capsule Take 1 capsule (72 mcg total) by mouth daily before breakfast. 30 capsule 3   No current facility-administered medications for this visit.      PHYSICAL EXAMINATION: ECOG PERFORMANCE STATUS: 1 - Symptomatic but completely ambulatory Vitals:   04/29/18 1318  BP: 117/80  Pulse: 96  Resp: 18  Temp: (!) 96.7 F (35.9 C)  SpO2: 96%   Filed Weights   04/29/18 1318  Weight: 179 lb 3.2 oz (81.3 kg)    Physical Exam Constitutional:      General: She is not in acute distress. HENT:     Head: Normocephalic and atraumatic.  Eyes:     General: No scleral icterus.    Pupils: Pupils are equal, round, and reactive to light.  Neck:     Musculoskeletal: Normal range of motion and neck supple.  Cardiovascular:     Rate and Rhythm: Normal rate and regular rhythm.     Heart sounds: Normal heart sounds.  Pulmonary:     Effort: Pulmonary effort is normal. No respiratory distress.     Breath sounds: Normal breath sounds. No wheezing.  Abdominal:     General: Bowel sounds are normal. There is no distension.     Palpations: Abdomen is soft. There is no mass.     Tenderness: There is no abdominal tenderness.  Musculoskeletal: Normal range of motion.        General: No  deformity.  Skin:    General: Skin is warm and dry.     Findings: No erythema or rash.  Neurological:     Mental Status: She is alert and oriented to person, place, and time.     Cranial Nerves: No cranial nerve deficit.     Coordination: Coordination normal.  Psychiatric:        Behavior: Behavior normal.        Thought Content: Thought content normal.      LABORATORY DATA:  I have reviewed the data as listed Lab Results  Component Value Date   WBC 5.4 04/28/2018   HGB 12.6 04/28/2018   HCT 40.5 04/28/2018   MCV 86.7 04/28/2018   PLT 183 04/28/2018   Recent Labs    07/02/17 1344  08/02/17 1146 09/01/17 1111 09/20/17 1026 02/04/18 1701 04/12/18 1540  NA 140  --  140 139 140 141 141  K 3.6  --  4.2 3.8 3.7 4.5 3.7  CL 104  --  102 100 106 103 104  CO2 29  --  31 31 26 28 29   GLUCOSE 96  --  96 93 121* 85 91  BUN 20  --  23 22 17 23 20   CREATININE 0.75  --  0.80 0.80 0.63 0.87 0.83  CALCIUM 9.2  --  9.2 9.5 8.6* 9.2 9.1  GFRNONAA >60  --   --   --  >60  --  >  60  GFRAA >60  --   --   --  >60  --  >60  PROT  --    < > 7.3 7.5 7.5 7.3  --   ALBUMIN  --   --  4.1 4.1 3.9  --   --   AST  --    < > 17 17 21 17   --   ALT  --    < > 10 12 12 11   --   ALKPHOS  --   --  44 57 60  --   --   BILITOT  --    < > 0.6 0.6 0.8 0.5  --    < > = values in this interval not displayed.   Iron/TIBC/Ferritin/ %Sat    Component Value Date/Time   IRON 58 04/28/2018 1307   TIBC 394 04/28/2018 1307   FERRITIN 70 04/28/2018 1307   IRONPCTSAT 15 04/28/2018 1307        ASSESSMENT & PLAN:  1. Iron deficiency anemia due to chronic blood loss   2. Chronic anticoagulation    Labs reviewed and discussed with patient.  Iron stores and hemoglobin have been stable. Hold additional IV Venofer at this point. Recommend patient to continue take oral ferrous sulfate 325 mg daily.  Refill sent to pharmacy. Chronic anticoagulation on Coumadin for atrial fibrillation.  Tolerating well..  No  bleeding events. RTC CBC iron ferritin in 6 months and follow-up in clinic.  Orders Placed This Encounter  Procedures  . CBC with Differential/Platelet    Standing Status:   Future    Standing Expiration Date:   04/29/2019  . Ferritin    Standing Status:   Future    Standing Expiration Date:   04/30/2019  . Iron and TIBC    Standing Status:   Future    Standing Expiration Date:   04/30/2019    All questions were answered. The patient knows to call the clinic with any problems questions or concerns.  Return of visit: 6 months  Earlie Server, MD, PhD Hematology Oncology [facility] at [facility] Pager- [phone] 04/30/2018

## 2018-05-05 ENCOUNTER — Encounter: Payer: Self-pay | Admitting: Internal Medicine

## 2018-05-16 DIAGNOSIS — H8112 Benign paroxysmal vertigo, left ear: Secondary | ICD-10-CM | POA: Diagnosis not present

## 2018-05-16 DIAGNOSIS — R42 Dizziness and giddiness: Secondary | ICD-10-CM | POA: Diagnosis not present

## 2018-05-23 ENCOUNTER — Ambulatory Visit: Payer: Medicare Other | Admitting: Internal Medicine

## 2018-06-02 ENCOUNTER — Ambulatory Visit (INDEPENDENT_AMBULATORY_CARE_PROVIDER_SITE_OTHER): Payer: Medicare Other | Admitting: Internal Medicine

## 2018-06-02 ENCOUNTER — Other Ambulatory Visit: Payer: Self-pay

## 2018-06-02 ENCOUNTER — Ambulatory Visit (INDEPENDENT_AMBULATORY_CARE_PROVIDER_SITE_OTHER): Payer: Medicare Other | Admitting: General Practice

## 2018-06-02 ENCOUNTER — Encounter: Payer: Self-pay | Admitting: Internal Medicine

## 2018-06-02 VITALS — BP 145/86 | HR 120 | Temp 98.6°F | Ht 65.0 in | Wt 179.6 lb

## 2018-06-02 DIAGNOSIS — I509 Heart failure, unspecified: Secondary | ICD-10-CM | POA: Diagnosis not present

## 2018-06-02 DIAGNOSIS — Z7901 Long term (current) use of anticoagulants: Secondary | ICD-10-CM | POA: Diagnosis not present

## 2018-06-02 DIAGNOSIS — I4891 Unspecified atrial fibrillation: Secondary | ICD-10-CM

## 2018-06-02 DIAGNOSIS — I4821 Permanent atrial fibrillation: Secondary | ICD-10-CM

## 2018-06-02 DIAGNOSIS — H8110 Benign paroxysmal vertigo, unspecified ear: Secondary | ICD-10-CM | POA: Diagnosis not present

## 2018-06-02 LAB — POCT INR: INR: 2.2 (ref 2.0–3.0)

## 2018-06-02 MED ORDER — DILTIAZEM HCL ER COATED BEADS 120 MG PO CP24: 120.0000 mg | ORAL_CAPSULE | Freq: Two times a day (BID) | ORAL | 3 refills | Status: DC

## 2018-06-02 NOTE — Progress Notes (Signed)
Electrophysiology Office Note   Date:  06/02/2018   ID:  [name], DOB Mar 19, 1933, MRN [id number]  PCP:  Abner Greenspan, MD  Cardiologist:   Primary Electrophysiologist:  Virl Axe, MD    No chief complaint on file.    History of Present Illness: [name] is a 85 y.o. female seen in followup electrophysiology evaluation for atrial fibrillation.    She has hx of  atrial flutter ablation related to prior atriotomy undertaken at [facility] in March 3976 complicated by recurrent atrial arrhythmias prompting initiation of Tikosyn  This was stopped fall 2012 Recurrent symptoms prompted [name] to reinitiate Tikosyn.  Shes had recurrent episodes of atrial fibrillation associated with a rapid ventricular response.   She underwent cardioversion and reverted again to atrial fibrillation; it is now permanent She has decided to remain on warfarin  She has undergone a lot of social change recently.  She has moved.  Her daughter-in-law died suddenly about 2 weeks ago.  Devastating. Breast and wound up tight.  Denies chest pain or shortness of breath.    DATE TEST EF   10/14 Echo   55-60 %   2/17 Echo   50-55 %   9/18 Echo  55-65%       Date Cr TSH Hgb  6/18    13.9  2/19 0.74  9.8  1/20 0.83 4.06(11/19) 12.6    Past Medical History:  Diagnosis Date  . Allergic rhinitis   . 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 Arthritis   . Atrial fibrillation -persistent cardiologist-  dr klein/  primary EP -- dr Tawanna Sat ([facility])   a. s/p PVI [name] 2010;  b. on tikosyn/coumadin;  c. 05/2009 Echo: EF 60-65%, Gr 2 DD. (first dx 09/ 2007)  . Bilateral lower extremity edema   . Bleeding hemorrhoid   . Carotid stenosis    mild (hosp 3/11)- consult by vasc/ Dr Donnetta Hutching  . Complication of anesthesia    hard to wake  . Diverticulosis of colon   . Dyspnea    on exertion-climbing stairs  . Dysrhythmia   . Fatty liver   . H/O cardiac radiofrequency ablation    01/ 2008 at [facility] /  03/  2010  at [facility]  . Heart failure with preserved ejection fraction ([place])   . Heart murmur    "prior to valve repair"  . History of adenomatous polyp of colon    tubular adenoma's  . History of cardiomyopathy    secondary tachycardia-induced cardiomyopathy -- resolved 2014  . History of squamous cell carcinoma in situ (SCCIS) of skin    05/ 2017  nasal bridge and right medial knee  . History of transient ischemic attack (TIA)    01-24-2005 and 06-12-2009  . Hyperlipidemia   . Hypothyroidism   . Mild intermittent asthma    reacts to cats  . Mixed stress and urge urinary incontinence   . Pulmonary nodule   . S/P mitral valve repair 10-23-1998  dr Boyce Medici at [facility]   for MVP and regurg. (annuloplasty ring procedure)  . Swelling of left extremity 2017   states it's gotten worse  . Varicose vein of leg   . Wears glasses    Past Surgical History:  Procedure Laterality Date  . APPENDECTOMY  1978  . CARDIAC ELECTROPHYSIOLOGY [place] AND ABLATION  01/ 2008    at [place]   right-sided ablation atrial flutter  . CARDIAC ELECTROPHYSIOLOGY STUDY AND ABLATION  03/ 2010   dr Jaymes Graff at [facility]   AV node ablation and pulmonary vein isolation for atrial fib  . CARDIOVERSION  06-18-2006;  07-13-2006;  10-19-2010;  10-27-2010  . COLONOSCOPY    . COLONOSCOPY WITH PROPOFOL N/A 10/13/2017   Procedure: COLONOSCOPY WITH PROPOFOL;  Surgeon: [name], MD;  Location: [facility] ENDOSCOPY;  Service: Gastroenterology;  Laterality: N/A;  . CYSTO/ TRANSURETHRAL COLLAGEN INJECTION THERAPY  07-26-2007   dr Matilde Sprang  . DILATION AND CURETTAGE OF UTERUS    . ESOPHAGOGASTRODUODENOSCOPY (EGD) WITH PROPOFOL N/A 10/13/2017   Procedure: ESOPHAGOGASTRODUODENOSCOPY (EGD) WITH PROPOFOL;  Surgeon: [name], MD;  Location: [facility] ENDOSCOPY;  Service: Gastroenterology;  Laterality: N/A;  . EXCISIONAL HEMORRHOIDECTOMY  1980s  . GIVENS CAPSULE STUDY N/A 12/08/2017   Procedure: GIVENS CAPSULE STUDY;   Surgeon: [name], MD;  Location: [facility] ENDOSCOPY;  Service: Gastroenterology;  Laterality: N/A;  . HEMORRHOID SURGERY N/A 10/29/2016   Procedure: HEMORRHOIDECTOMY;  Surgeon: Leighton Ruff, MD;  Location: [facility];  Service: General;  Laterality: N/A;  . MITRAL VALVE ANNULOPLASTY  10/23/1998   "Model [id number]; [employer] Lerner [id number]"; size 33mm; [facility]; Dr. Boyce Medici  . PILONIDAL CYST EXCISION  1954  . TEE WITH CARDIOVERSION  05-06-2006 at [facility];  01-02-2013 at [facility]  . TOTAL HIP ARTHROPLASTY Left 05/04/2017   Procedure: LEFT TOTAL HIP ARTHROPLASTY ANTERIOR APPROACH;  Surgeon: Mcarthur Rossetti, MD;  Location: [place];  Service: Orthopedics;  Laterality: Left;  . TRANSTHORACIC ECHOCARDIOGRAM  05-01-2015   dr Caryl Comes   ef 50-55%/  mild AV sclerosis without stenosis/  post MV repair with mild central MR (valve area by pressure half-time 2cm^2,  valve area by continutity equation 0.91cm^2, peak grandiant 71mmHg)/  severe LAE/ mild TR/ mild RAE   . TUBAL LIGATION Bilateral 1978     Current Outpatient Medications  Medication Sig Dispense Refill  . albuterol (PROVENTIL HFA;VENTOLIN HFA) 108 (90 Base) MCG/ACT inhaler Inhale 2 puffs into the lungs every 4 (four) hours as needed for wheezing or shortness of breath. 1 Inhaler 0  . alendronate (FOSAMAX) 70 MG tablet Take 1 tablet (70 mg total) by mouth every 7 (seven) days. Take with a full glass of water on an empty stomach. 4 tablet 11  . Cholecalciferol (VITAMIN D3) 2000 units TABS Take 4,000 Units by mouth daily.    . clindamycin (CLEOCIN) 150 MG capsule TAKE 4 CAPSULES BY MOUTH 1 HR PRIOR TO DENTAL APPT  0  . diltiazem (CARTIA XT) 180 MG 24 hr capsule Take 1 capsule (180 mg total) by mouth daily. 90 capsule 3  . diphenhydrAMINE (BENADRYL) 25 mg capsule Take 50 mg by mouth every 6 (six) hours as needed for itching.    . docusate sodium (COLACE) 100 MG capsule Take 1 capsule (100 mg total) by mouth daily. 30 capsule 3  . enoxaparin  (LOVENOX) 120 MG/0.8ML injection Inject 0.8 mLs (120 mg total) into the skin daily. 6 Syringe 0  . ferrous sulfate 325 (65 FE) MG EC tablet Take 1 tablet (325 mg total) by mouth daily with breakfast. 30 tablet 3  . fluticasone (FLONASE) 50 MCG/ACT nasal spray Place 1 spray into both nostrils daily as needed for allergies.    . furosemide (LASIX) 40 MG tablet Take 0.5 tablets (20 mg total) by mouth daily. TAKE 1/2 TABLET BY MOUTH DAILY, MAY INCREASE TO 1 TABLET EVERY DAY IF NEEDED 90 tablet 0  . levothyroxine (SYNTHROID, LEVOTHROID) 25 MCG tablet TAKE 1 TABLET EVERY DAY BEFORE BREAKFAST 90  tablet 1  . linaclotide (LINZESS) 72 MCG capsule Take 1 capsule (72 mcg total) by mouth daily before breakfast. 30 capsule 3  . loratadine (CLARITIN) 10 MG tablet Take 10 mg by mouth daily as needed for allergies.     [name] meclizine (ANTIVERT) 25 MG tablet Take 1 tablet (25 mg total) by mouth 3 (three) times daily as needed for dizziness. 30 tablet 0  . metoprolol tartrate (LOPRESSOR) 25 MG tablet TAKE 1 TABLET (25 MG TOTAL) BY MOUTH 3 (THREE) TIMES DAILY AS NEEDED. FOR FAST HEART RATE 60 tablet 5  . Polyethyl Glycol-Propyl Glycol (LUBRICANT EYE DROPS) 0.4-0.3 % SOLN Place 1-2 drops into both eyes 3 (three) times daily as needed (for dry eyes.).    [name] warfarin (COUMADIN) 5 MG tablet Take 1 tablet daily except take 1/2 tablet on Monday and Friday or TAKE AS DIRECTED BY ANTICOAGULATION CLINIC  90 day 90 tablet 1   No current facility-administered medications for this visit.     Allergies:   Amiodarone hcl; Penicillins; and Statins   Social History:  The patient  reports that she has never smoked. She has never used smokeless tobacco. She reports current alcohol use. She reports that she does not use drugs.   Family History:  The patient's family history includes Alcohol abuse in her father; Cancer in her father; Lung cancer in her father.    ROS:  Please see the history of present illness.  .   All other systems are  reviewed and negative.    PHYSICAL EXAM: VS:  BP (!) 145/86   Pulse (!) 120   Temp 98.6 F (37 C)   Ht 5\' 5"  (1.651 m)   Wt 179 lb 9.6 oz (81.5 kg)   SpO2 95%   BMI 29.89 kg/m  , BMI Body mass index is 29.89 kg/m. Well developed and nourished in no acute distress HENT normal Neck supple with JVP-  Clear Irregularly irregular rate and rhythm with rapid ventricular response, no murmurs or gallops Abd-soft with active BS without hepatomegaly No Clubbing cyanosis edema Skin-warm and dry A & Oriented  Grossly normal sensory and motor function   EKG: Afib @ 120 -/09/34    Recent Labs: 02/04/2018: ALT 11; TSH 4.06 04/12/2018: BUN 20; Creatinine, Ser 0.83; Potassium 3.7; Sodium 141 04/28/2018: Hemoglobin 12.6; Platelets 183    Lipid Panel     Component Value Date/Time   CHOL 212 (H) 09/01/2017 1111   TRIG 90.0 09/01/2017 1111   HDL 37.60 (L) 09/01/2017 1111   CHOLHDL 6 09/01/2017 1111   VLDL 18.0 09/01/2017 1111   LDLCALC 156 (H) 09/01/2017 1111   LDLDIRECT 203.8 01/09/2008 0934     Wt Readings from Last 3 Encounters:  06/02/18 179 lb 9.6 oz (81.5 kg)  04/29/18 179 lb 3.2 oz (81.3 kg)  04/12/18 175 lb (79.4 kg)      Other studies Reviewed: Additional studies/ records that were reviewed today include: labs as noted  Review of the above records today demonstrates:    ASSESSMENT AND PLAN: Atrial fibrillation-permanent  Hypothyroidism-treated     Dypsnea on exertion   Anemia   Grief   Anemia is resolved  TSH is normal  Rapid atrial fibrillation and elevated blood pressure may be in the context of stress.  However we will increase her diltiazem from 180--120 twice daily and plan to reassess in a few months.  Her rapid heart rate is concerning   Labs/ tests ordered today include:    Orders  Placed This Encounter  Procedures  . EKG 12-Lead   We spent more than 50% of our >25 min visit in face to face counseling regarding the above   Disposition:   3  months   Signed, Virl Axe, MD  06/02/2018 4:28 PM     [address] [place] [address] [phone] (office) [phone] (fax)

## 2018-06-02 NOTE — Patient Instructions (Signed)
Pre visit review using our clinic review tool, if applicable. No additional management support is needed unless otherwise documented below in the visit note.  Continue to take 1 pill (5mg) daily EXCEPT for 1/2 pill (2.5mg) on Mondays and Fridays.   Re-check in 6 weeks.    

## 2018-06-02 NOTE — Patient Instructions (Signed)
Medication Instructions:  Your physician has recommended you make the following change in your medication:   Increase your Diltiazem to 120mg  capsule, two times per day  Labwork: None ordered.  Testing/Procedures: None ordered.  Follow-Up: Your physician recommends that you schedule a follow-up appointment in:   12 weeks with Dr Caryl Comes.  Any Other Special Instructions Will Be Listed Below (If Applicable).     If you need a refill on your cardiac medications before your next appointment, please call your pharmacy.

## 2018-06-16 ENCOUNTER — Other Ambulatory Visit: Payer: Self-pay | Admitting: Internal Medicine

## 2018-06-16 NOTE — Telephone Encounter (Signed)
Pt's pharmacy is requesting a refill on levothyroxine. Would Dr. Klein like to refill this medication? Please address 

## 2018-07-11 ENCOUNTER — Telehealth: Payer: Self-pay | Admitting: Family Medicine

## 2018-07-11 MED ORDER — LEVOTHYROXINE SODIUM 25 MCG PO TABS: 25.0000 ug | ORAL_TABLET | Freq: Every day | ORAL | 1 refills | Status: DC

## 2018-07-11 NOTE — Telephone Encounter (Signed)
Best number [phone] Pt called needing to get a refill on Levothyroxine  [facility]  Pt has this week plus 3 pills left

## 2018-07-11 NOTE — Telephone Encounter (Signed)
Med refilled and pt notified

## 2018-07-14 ENCOUNTER — Other Ambulatory Visit: Payer: Self-pay

## 2018-07-14 ENCOUNTER — Other Ambulatory Visit (INDEPENDENT_AMBULATORY_CARE_PROVIDER_SITE_OTHER): Payer: Medicare Other

## 2018-07-14 ENCOUNTER — Ambulatory Visit (INDEPENDENT_AMBULATORY_CARE_PROVIDER_SITE_OTHER): Payer: Medicare Other | Admitting: General Practice

## 2018-07-14 DIAGNOSIS — I4819 Other persistent atrial fibrillation: Secondary | ICD-10-CM

## 2018-07-14 DIAGNOSIS — I4891 Unspecified atrial fibrillation: Secondary | ICD-10-CM

## 2018-07-14 DIAGNOSIS — Z5181 Encounter for therapeutic drug level monitoring: Secondary | ICD-10-CM | POA: Diagnosis not present

## 2018-07-14 DIAGNOSIS — Z7901 Long term (current) use of anticoagulants: Secondary | ICD-10-CM | POA: Diagnosis not present

## 2018-07-14 LAB — POCT INR: INR: 2.2 (ref 2.0–3.0)

## 2018-07-14 NOTE — Patient Instructions (Signed)
Pre visit review using our clinic review tool, if applicable. No additional management support is needed unless otherwise documented below in the visit note.  Continue to take 1 pill (5mg ) daily EXCEPT for 1/2 pill (2.5mg ) on Mondays and Fridays.   Re-check in 6 weeks.

## 2018-08-03 ENCOUNTER — Other Ambulatory Visit: Payer: Self-pay | Admitting: Internal Medicine

## 2018-08-03 DIAGNOSIS — H2513 Age-related nuclear cataract, bilateral: Secondary | ICD-10-CM | POA: Diagnosis not present

## 2018-08-25 ENCOUNTER — Ambulatory Visit (INDEPENDENT_AMBULATORY_CARE_PROVIDER_SITE_OTHER): Payer: Medicare Other

## 2018-08-25 ENCOUNTER — Other Ambulatory Visit (INDEPENDENT_AMBULATORY_CARE_PROVIDER_SITE_OTHER): Payer: Medicare Other

## 2018-08-25 DIAGNOSIS — I4819 Other persistent atrial fibrillation: Secondary | ICD-10-CM

## 2018-08-25 DIAGNOSIS — I4891 Unspecified atrial fibrillation: Secondary | ICD-10-CM

## 2018-08-25 DIAGNOSIS — Z5181 Encounter for therapeutic drug level monitoring: Secondary | ICD-10-CM

## 2018-08-25 DIAGNOSIS — Z7901 Long term (current) use of anticoagulants: Secondary | ICD-10-CM | POA: Diagnosis not present

## 2018-08-25 LAB — POCT INR: INR: 2.1 (ref 2.0–3.0)

## 2018-08-25 NOTE — Patient Instructions (Signed)
Pre visit review using our clinic review tool, if applicable. No additional management support is needed unless otherwise documented below in the visit note.  Continue to take 1 pill (5mg ) daily EXCEPT for 1/2 pill (2.5mg ) on Mondays and Fridays.   Re-check in 6 weeks.

## 2018-09-05 ENCOUNTER — Encounter: Payer: Medicare Other | Admitting: Family Medicine

## 2018-09-05 ENCOUNTER — Encounter: Payer: Self-pay | Admitting: Family Medicine

## 2018-09-05 ENCOUNTER — Ambulatory Visit (INDEPENDENT_AMBULATORY_CARE_PROVIDER_SITE_OTHER): Payer: Medicare Other | Admitting: Family Medicine

## 2018-09-05 ENCOUNTER — Ambulatory Visit (INDEPENDENT_AMBULATORY_CARE_PROVIDER_SITE_OTHER): Payer: Medicare Other

## 2018-09-05 VITALS — Wt 181.0 lb

## 2018-09-05 DIAGNOSIS — F418 Other specified anxiety disorders: Secondary | ICD-10-CM | POA: Diagnosis not present

## 2018-09-05 DIAGNOSIS — I4891 Unspecified atrial fibrillation: Secondary | ICD-10-CM | POA: Diagnosis not present

## 2018-09-05 DIAGNOSIS — E039 Hypothyroidism, unspecified: Secondary | ICD-10-CM

## 2018-09-05 DIAGNOSIS — D500 Iron deficiency anemia secondary to blood loss (chronic): Secondary | ICD-10-CM | POA: Diagnosis not present

## 2018-09-05 DIAGNOSIS — J4520 Mild intermittent asthma, uncomplicated: Secondary | ICD-10-CM

## 2018-09-05 DIAGNOSIS — R600 Localized edema: Secondary | ICD-10-CM

## 2018-09-05 DIAGNOSIS — Z0000 Encounter for general adult medical examination without abnormal findings: Secondary | ICD-10-CM | POA: Diagnosis not present

## 2018-09-05 DIAGNOSIS — Z1231 Encounter for screening mammogram for malignant neoplasm of breast: Secondary | ICD-10-CM

## 2018-09-05 DIAGNOSIS — E7800 Pure hypercholesterolemia, unspecified: Secondary | ICD-10-CM

## 2018-09-05 DIAGNOSIS — M8589 Other specified disorders of bone density and structure, multiple sites: Secondary | ICD-10-CM

## 2018-09-05 NOTE — Assessment & Plan Note (Signed)
Uses albuterol prn

## 2018-09-05 NOTE — Patient Instructions (Signed)
I did a mammogram referral- the office will call you to set that u  Also to set up a fasting lab draw at your convenience  Avoid red meat/ fried foods/ egg yolks/ fatty breakfast meats/ butter, cheese and high fat dairy/ and shellfish   Try to stay active in the house or outdoors if safe  No change in medicines  Continue hematology and cardiology f/u

## 2018-09-05 NOTE — Assessment & Plan Note (Signed)
Seeing Dr Caryl Comes  On cardizem and metoprolol Worsened after a stressful event and bp went up -now improved Has cardiology appt upcoming

## 2018-09-05 NOTE — Assessment & Plan Note (Signed)
Taking alendronate and tolerateing it well since 2018 dexa 7/18  Will hold off on next one until pandemic is improved  Taking px vit D from heme/onc 4000 per day  Encouraged exercise No falls or fx Fall prev discussed

## 2018-09-05 NOTE — Assessment & Plan Note (Signed)
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

## 2018-09-05 NOTE — Assessment & Plan Note (Signed)
Due for labs  On cardizem and metoprolol from cardiology -can add to this  Enc her to avoid excess sodium

## 2018-09-05 NOTE — Assessment & Plan Note (Signed)
Hypothyroidism  Pt has no clinical changes No change in energy level/ hair or skin/ edema and no tremor Lab Results  Component Value Date   TSH 4.06 02/04/2018    Will plan a lab draw

## 2018-09-05 NOTE — Progress Notes (Signed)
PCP notes:   Health maintenance:  No gaps identified  Abnormal screenings:   None  Patient concerns:   None  Nurse concerns:  None  Next PCP appt:   09/05/18 @ 1400  I reviewed health advisor's note, was available for consultation, and agree with documentation and plan. [name] MD

## 2018-09-05 NOTE — Progress Notes (Signed)
Virtual Visit via Video Note  I connected with [name] on 09/05/18 at  2:00 PM EDT by a video enabled telemedicine application and verified that I am speaking with the correct person using two identifiers.  Location: Patient: home Provider: office    I discussed the limitations of evaluation and management by telemedicine and the availability of in person appointments. The patient expressed understanding and agreed to proceed.  email doxy did not work for video today-the visit was done by phone  History of Present Illness: Pt presents for annual f/u of chronic health problems Gets out occ- wearing mask when she does   Had amw earlier today No gaps or concerns  Moved to a one story house - and she really likes it  Walks more slowly lately /uses caution    Weight  Wt Readings from Last 3 Encounters:  06/02/18 179 lb 9.6 oz (81.5 kg)  04/29/18 179 lb 3.2 oz (81.3 kg)  04/12/18 175 lb (79.4 kg)  wt 181 lb this am    Doing some walking for exercise - weather is an issue  Tries to walk in the house  It is very hard for her - hip and joint problems (arthritis is worse)  Snacking at home too much   Tetanus shot postponed for insurance reasons  dexa 7/18  Osteopenia  Taking alendronate since 8/18  No falls or fractures  Taking vit D- by px from the cancer center  4000 iu daily   Colonoscopy 7/19 - has a small bowel avm/part of w/u   Mammogram 7/18 normal -has not had a mammogram since  She wants [name] to refer at [facility] breast exam -- no lumps    Zoster status -had shingles years ago Not interested in vaccine    a fib  On warfarin  Walking makes her very tired/does not get palpitations however  Thinks it is stable  Seeing Dr Caryl Comes for cardiology  Pulse Readings from Last 3 Encounters:  06/02/18 (!) 120  04/29/18 96  04/12/18 84   Had a stressful time- and it got worse briefly (lost DIL) Gets angina occ only when very stressed  Has appt with Dr  Caryl Comes upcoming Taking cardizem and metorpolol bp was up a bit also -watching this       Hypothyroidism  Pt has no clinical changes  (her baseline fatigue)  No change in energy level/ hair or skin/ edema and no tremor Lab Results  Component Value Date   TSH 4.06 02/04/2018      Mood -gets down when it rains and she cannot socialize during the pandemic   Hyperlipidemia Lab Results  Component Value Date   CHOL 212 (H) 09/01/2017   HDL 37.60 (L) 09/01/2017   LDLCALC 156 (H) 09/01/2017   LDLDIRECT 203.8 01/09/2008   TRIG 90.0 09/01/2017   CHOLHDL 6 09/01/2017  she is allergic to statins (gets a rash)    H/o iron def Was getting iron infusions (had one in sept per pt)  Then transitioned to IV iron  Has had 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  HENT: Negative for sore throat.   Eyes: Negative for blurred vision, discharge and redness.  Respiratory: Negative for cough, shortness of breath and wheezing.   Cardiovascular: Negative for chest pain, palpitations and leg swelling.  Gastrointestinal: Negative for blood in stool, diarrhea, heartburn and nausea.  Genitourinary: Negative for dysuria.  Musculoskeletal: Negative for myalgias.  Skin: Negative for itching and rash.  Neurological: Negative for dizziness and headaches.  Endo/Heme/Allergies: Negative for polydipsia.  Psychiatric/Behavioral: Positive for depression. The patient is not nervous/anxious.        Gets down when the weather is blah  Worse during the pandemic since  she cannot see family Getting by ok     Patient Active Problem List   Diagnosis Date Noted  . Dysuria 02/06/2018  . Dizziness 02/04/2018  . Iron deficiency anemia 10/21/2017  . Rapid atrial fibrillation ([place]) 08/19/2017  . Constipation 08/02/2017  . Numbness and tingling 07/14/2017  . Paresthesia 07/14/2017  . Unilateral primary osteoarthritis, left hip 05/04/2017  . Status post total replacement of left hip 05/04/2017  . Hip osteoarthritis 04/27/2017  . Long term (current) use of anticoagulants 03/04/2017  . Venous stasis dermatitis of both lower extremities 01/08/2017  . Impacted cerumen of right ear 11/20/2016  . Osteopenia 10/25/2016  . Pedal edema 08/26/2016  . Varicose veins of both lower extremities 08/26/2016  . Estrogen deficiency 08/26/2016  . Screening mammogram, encounter for 08/26/2016  . Hemorrhoids 08/26/2016  . Epistaxis 01/07/2016  . History of nonmelanoma skin cancer 01/01/2016  . Urticaria 08/22/2014  . Hip pain 08/02/2014  . Left knee pain 08/02/2014  . Chronic cough 05/08/2014  . Caregiver stress 08/16/2013  . Colon cancer screening 08/16/2013  . Encounter for therapeutic drug monitoring 04/20/2013  . Left ovarian cyst 03/14/2013  . (HFpEF) heart failure with preserved ejection fraction ([place]) 12/27/2012  . Cardiomyopathy, secondary --Resolved again 10/14 10/13/2010  . COLONIC POLYPS, ADENOMATOUS, HX OF 09/18/2009  . PULMONARY NODULE 12/20/2008  . GANGLION CYST 10/04/2007  . Hyperlipidemia 04/27/2007  . DEPRESSION 04/27/2007  . Asthma, mild intermittent 04/27/2007  . INSOMNIA 04/27/2007  . ADENOMATOUS COLONIC POLYP 11/04/2006  . Hypothyroidism 09/02/2006  . Atrial fibrillation ([place]) 08/05/2006   Past Medical History:  Diagnosis Date  . Allergic rhinitis   . 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 Arthritis   . Atrial fibrillation -persistent cardiologist-  dr klein/  primary EP -- dr Tawanna Sat ([facility])   a. s/p PVI [name] 2010;  b. on  tikosyn/coumadin;  c. 05/2009 Echo: EF 60-65%, Gr 2 DD. (first dx 09/ 2007)  . Bilateral lower extremity edema   . Bleeding hemorrhoid   . Carotid stenosis    mild (hosp 3/11)- consult by vasc/ Dr Donnetta Hutching  . Complication of anesthesia    hard to wake  . Diverticulosis of colon   . Dyspnea    on exertion-climbing stairs  . Dysrhythmia   . Fatty liver   . H/O cardiac radiofrequency ablation    01/ 2008 at [facility] /  03/ 2010  at [facility]  . Heart failure with preserved ejection fraction ([place])   . Heart murmur    "prior to valve repair"  . History of adenomatous polyp of colon    tubular adenoma's  . History of cardiomyopathy    secondary tachycardia-induced cardiomyopathy -- resolved 2014  .  History of squamous cell carcinoma in situ (SCCIS) of skin    05/ 2017  nasal bridge and right medial knee  . History of transient ischemic attack (TIA)    01-24-2005 and 06-12-2009  . Hyperlipidemia   . Hypothyroidism   . Mild intermittent asthma    reacts to cats  . Mixed stress and urge urinary incontinence   . Pulmonary nodule   . S/P mitral valve repair 10-23-1998  dr Boyce Medici at [facility]   for MVP and regurg. (annuloplasty ring procedure)  . Swelling of left extremity 2017   states it's gotten worse  . Varicose vein of leg   . Wears glasses    Past Surgical History:  Procedure Laterality Date  . APPENDECTOMY  1978  . CARDIAC ELECTROPHYSIOLOGY [place] AND ABLATION  01/ 2008    at [place]   right-sided ablation atrial flutter  . CARDIAC ELECTROPHYSIOLOGY STUDY AND ABLATION  03/ 2010   dr Jaymes Graff at [facility]   AV node ablation and pulmonary vein isolation for atrial fib  . CARDIOVERSION  06-18-2006;  07-13-2006;  10-19-2010;  10-27-2010  . COLONOSCOPY    . COLONOSCOPY WITH PROPOFOL N/A 10/13/2017   Procedure: COLONOSCOPY WITH PROPOFOL;  Surgeon: [name], MD;  Location[facility] ENDOSCOPY;  Service: Gastroenterology;  Laterality: N/A;  . CYSTO/  TRANSURETHRAL COLLAGEN INJECTION THERAPY  07-26-2007   dr Matilde Sprang  . DILATION AND CURETTAGE OF UTERUS    . ESOPHAGOGASTRODUODENOSCOPY (EGD) WITH PROPOFOL N/A 10/13/2017   Procedure: ESOPHAGOGASTRODUODENOSCOPY (EGD) WITH PROPOFOL;  Surgeon: [name], MD;  Location: [facility] ENDOSCOPY;  Service: Gastroenterology;  Laterality: N/A;  . EXCISIONAL HEMORRHOIDECTOMY  1980s  . GIVENS CAPSULE STUDY N/A 12/08/2017   Procedure: GIVENS CAPSULE STUDY;  Surgeon: [name], MD;  Location: [facility] ENDOSCOPY;  Service: Gastroenterology;  Laterality: N/A;  . HEMORRHOID SURGERY N/A 10/29/2016   Procedure: HEMORRHOIDECTOMY;  Surgeon: Leighton Ruff, MD;  Location: [facility];  Service: General;  Laterality: N/A;  . MITRAL VALVE ANNULOPLASTY  10/23/1998   "Model [id number]; [employer] Lerner [id number]"; size 24mm; [facility]; Dr. Boyce Medici  . PILONIDAL CYST EXCISION  1954  . TEE WITH CARDIOVERSION  05-06-2006 at [facility];  01-02-2013 at [facility]  . TOTAL HIP ARTHROPLASTY Left 05/04/2017   Procedure: LEFT TOTAL HIP ARTHROPLASTY ANTERIOR APPROACH;  Surgeon: Mcarthur Rossetti, MD;  Location: [place];  Service: Orthopedics;  Laterality: Left;  . TRANSTHORACIC ECHOCARDIOGRAM  05-01-2015   dr Caryl Comes   ef 50-55%/  mild AV sclerosis without stenosis/  post MV repair with mild central MR (valve area by pressure half-time 2cm^2,  valve area by continutity equation 0.91cm^2, peak grandiant 3mmHg)/  severe LAE/ mild TR/ mild RAE   . TUBAL LIGATION Bilateral 1978   Social History   Tobacco Use  . Smoking status: Never Smoker  . Smokeless tobacco: Never Used  Substance Use Topics  . Alcohol use: Yes    Alcohol/week: 0.0 standard drinks    Comment: seldom  . Drug use: No   Family History  Problem Relation Age of Onset  . Lung cancer Father        smoker  . Alcohol abuse Father   . Cancer Father        bladder and lung CA smoker  . Breast cancer Neg Hx    Allergies  Allergen Reactions  . Amiodarone Hcl Swelling     SWELLING REACTION UNSPECIFIED   . Penicillins Rash    Has patient had a PCN reaction causing immediate  rash, facial/tongue/throat swelling, SOB or lightheadedness with hypotension: No Has patient had a PCN reaction causing severe rash involving mucus membranes or skin necrosis: No Has patient had a PCN reaction that required hospitalization:Patient was inpatient when reaction occurred Has patient had a PCN reaction occurring within the last 10 years: No If all of the above answers are "NO", then may proceed with Cephalosporin use.   . Statins Rash   Current Outpatient Medications on File Prior to Visit  Medication Sig Dispense Refill  . albuterol (PROVENTIL HFA;VENTOLIN HFA) 108 (90 Base) MCG/ACT inhaler Inhale 2 puffs into the lungs every 4 (four) hours as needed for wheezing or shortness of breath. 1 Inhaler 0  . alendronate (FOSAMAX) 70 MG tablet Take 1 tablet (70 mg total) by mouth every 7 (seven) days. Take with a full glass of water on an empty stomach. 4 tablet 11  . Cholecalciferol (VITAMIN D3) 2000 units TABS Take 4,000 Units by mouth daily.    . clindamycin (CLEOCIN) 150 MG capsule TAKE 4 CAPSULES BY MOUTH 1 HR PRIOR TO DENTAL APPT  0  . diltiazem (CARDIZEM CD) 120 MG 24 hr capsule Take 1 capsule (120 mg total) by mouth 2 (two) times daily. 180 capsule 3  . diphenhydrAMINE (BENADRYL) 25 mg capsule Take 50 mg by mouth every 6 (six) hours as needed for itching.    . docusate sodium (COLACE) 100 MG capsule Take 1 capsule (100 mg total) by mouth daily. 30 capsule 3  . enoxaparin (LOVENOX) 120 MG/0.8ML injection Inject 0.8 mLs (120 mg total) into the skin daily. 6 Syringe 0  . ferrous sulfate 325 (65 FE) MG EC tablet Take 1 tablet (325 mg total) by mouth daily with breakfast. 30 tablet 3  . fluticasone (FLONASE) 50 MCG/ACT nasal spray Place 1 spray into both nostrils daily as needed for allergies.    . furosemide (LASIX) 40 MG tablet TAKE 1/2 TABLET BY MOUTH DAILY, MAY INCREASE TO 1 TABLET  EVERY DAY IF NEEDED 90 tablet 1  . levothyroxine (SYNTHROID) 25 MCG tablet Take 1 tablet (25 mcg total) by mouth daily before breakfast. 90 tablet 1  . linaclotide (LINZESS) 72 MCG capsule Take 1 capsule (72 mcg total) by mouth daily before breakfast. 30 capsule 3  . loratadine (CLARITIN) 10 MG tablet Take 10 mg by mouth daily as needed for allergies.     [name] meclizine (ANTIVERT) 25 MG tablet Take 1 tablet (25 mg total) by mouth 3 (three) times daily as needed for dizziness. 30 tablet 0  . metoprolol tartrate (LOPRESSOR) 25 MG tablet TAKE 1 TABLET (25 MG TOTAL) BY MOUTH 3 (THREE) TIMES DAILY AS NEEDED. FOR FAST HEART RATE 60 tablet 5  . Polyethyl Glycol-Propyl Glycol (LUBRICANT EYE DROPS) 0.4-0.3 % SOLN Place 1-2 drops into both eyes 3 (three) times daily as needed (for dry eyes.).    [name] warfarin (COUMADIN) 5 MG tablet Take 1 tablet daily except take 1/2 tablet on Monday and Friday or TAKE AS DIRECTED BY ANTICOAGULATION CLINIC  90 day 90 tablet 1   No current facility-administered medications on file prior to visit.     Observations/Objective:  pt sounds well/like her usual self  Not in distress  Affect is normal/ cheerful today No cognitive changes-mentally sharp Talkative and answers questions appropriately  Not hoarse No sob or cough during interview   Assessment and Plan: Problem List Items Addressed This Visit      Cardiovascular and Mediastinum   Atrial fibrillation ([place])  Seeing Dr Caryl Comes  On cardizem and metoprolol Worsened after a stressful event and bp went up -now improved Has cardiology appt upcoming          Respiratory   Asthma, mild intermittent    Uses albuterol prn        Endocrine   Hypothyroidism - Primary    Hypothyroidism  Pt has no clinical changes No change in energy level/ hair or skin/ edema and no tremor Lab Results  Component Value Date   TSH 4.06 02/04/2018    Will plan a lab draw        Musculoskeletal and Integument   Osteopenia     Taking alendronate and tolerateing it well since 2018 dexa 7/18  Will hold off on next one until pandemic is improved  Taking px vit D from heme/onc 4000 per day  Encouraged exercise No falls or fx Fall prev discussed        Other   Hyperlipidemia    Allergic to statins (rash)  Disc goals for lipids and reasons to control them Rev last labs with pt Rev low sat fat diet in detail Last LDL in 150s Will set up fasting lab draw when able      Depression with anxiety    Per pt doing ok Gets more down with weather and was down re: the pandemic - but this has improved since she can get outside Enc self care Also exercise as tolerated and outdoor time      Pedal edema    Due for labs  On cardizem and metoprolol from cardiology -can add to this  Enc her to avoid excess sodium      Screening mammogram, encounter for   Relevant Orders   MM 3D SCREEN BREAST BILATERAL   Iron deficiency anemia    Had iron infusions in the fall/now on oral iron  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 The patient was provided an opportunity to ask questions and all were answered. The patient agreed with the plan and demonstrated an understanding of the instructions.   The patient was advised to call back or seek an in-person evaluation if the symptoms worsen or if the condition fails to improve as anticipated.  I provided 23 minutes of non-face-to-face  time during this encounter.   [name], MD

## 2018-09-05 NOTE — Assessment & Plan Note (Signed)
Allergic to statins (rash)  Disc goals for lipids and reasons to control them Rev last labs with pt Rev low sat fat diet in detail Last LDL in 150s Will set up fasting lab draw when able

## 2018-09-05 NOTE — Assessment & Plan Note (Signed)
Per pt doing ok Gets more down with weather and was down re: the pandemic - but this has improved since she can get outside Enc self care Also exercise as tolerated and outdoor time

## 2018-09-05 NOTE — Progress Notes (Signed)
Subjective:   [name] is a 85 y.o. female who presents for Medicare Annual (Subsequent) preventive examination.  Review of Systems: N/A Cardiac Risk Factors include: advanced age (>85men, >107 women);dyslipidemia     Objective:     Vitals: There were no vitals taken for this visit.  There is no height or weight on file to calculate BMI.  Advanced Directives 09/05/2018 04/29/2018 04/12/2018 12/23/2017 10/22/2017 10/13/2017 10/12/2017  Does Patient Have a Medical Advance Directive? Yes Yes No Yes Yes Yes Yes  Type of Paramedic of [place];Living will Living will;Healthcare Power of [place];Living will - [place];Living will  Does patient want to make changes to medical advance directive? - - - - - - -  Copy of [place] in Chart? No - copy requested - - - - - Yes  Would patient like information on creating a medical advance directive? - - - - - - -    Tobacco Social History   Tobacco Use  Smoking Status Never Smoker  Smokeless Tobacco Never Used     Counseling given: No   Clinical Intake:  Pre-visit preparation completed: Yes  Pain : No/denies pain Pain Score: 0-No pain     Nutritional Status: BMI 25 -29 Overweight Nutritional Risks: None Diabetes: No  How often do you need to have someone help you when you read instructions, pamphlets, or other written materials from your doctor or pharmacy?: 1 - Never What is the last grade level you completed in school?: 12th grade + 2 yrs college  Interpreter Needed?: No  Comments: pt is a widow and lives independently Information entered by :: LPinson, RN  Past Medical History:  Diagnosis Date   Allergic rhinitis    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 s/p PVI [name] 2010;  b. on tikosyn/coumadin;  c. 05/2009 Echo: EF  60-65%, Gr 2 DD. (first dx 09/ 2007)   Bilateral lower extremity edema    Bleeding hemorrhoid    Carotid stenosis    mild (hosp 3/11)- consult by vasc/ Dr Donnetta Hutching   Complication of anesthesia    hard to wake   Diverticulosis of colon    Dyspnea    on exertion-climbing stairs   Dysrhythmia    Fatty liver    H/O cardiac radiofrequency ablation    01/ 2008 at [facility] /  03/ 2010  at [facility] failure with preserved ejection fraction ([place])    Heart murmur    "prior to valve repair"   History of adenomatous polyp of colon    tubular adenoma's   History of cardiomyopathy    secondary tachycardia-induced cardiomyopathy -- resolved 2014   History of squamous cell carcinoma in situ (SCCIS) of skin    05/ 2017  nasal bridge and right medial knee   History of transient ischemic attack (TIA)    01-24-2005 and 06-12-2009   Hyperlipidemia    Hypothyroidism    Mild intermittent asthma    reacts to cats   Mixed stress and urge urinary incontinence    Pulmonary nodule    S/P mitral valve repair 10-23-1998  dr Boyce Medici at [facility]   for MVP and regurg. (annuloplasty ring procedure)   Swelling of left extremity 2017   states it's gotten worse  Varicose vein of leg    Wears glasses    Past Surgical History:  Procedure Laterality Date   [place] AND ABLATION  01/ 2008    at [facility] [place]   right-sided ablation atrial flutter   CARDIAC ELECTROPHYSIOLOGY STUDY AND ABLATION  03/ 2010   dr Jaymes Graff at [facility]   AV node ablation and pulmonary vein isolation for atrial fib   CARDIOVERSION  06-18-2006;  07-13-2006;  10-19-2010;  10-27-2010   COLONOSCOPY     COLONOSCOPY WITH PROPOFOL N/A 10/13/2017   Procedure: COLONOSCOPY WITH PROPOFOL;  Surgeon: [name], MD;  Location: [facility] ENDOSCOPY;  Service: Gastroenterology;  Laterality: N/A;   CYSTO/ TRANSURETHRAL COLLAGEN INJECTION THERAPY   07-26-2007   dr Matilde Sprang   DILATION AND CURETTAGE OF UTERUS     ESOPHAGOGASTRODUODENOSCOPY (EGD) WITH PROPOFOL N/A 10/13/2017   Procedure: ESOPHAGOGASTRODUODENOSCOPY (EGD) WITH PROPOFOL;  Surgeon: [name], MD;  Location: [facility] ENDOSCOPY;  Service: Gastroenterology;  Laterality: N/A;   EXCISIONAL HEMORRHOIDECTOMY  1980s   GIVENS CAPSULE STUDY N/A 12/08/2017   Procedure: GIVENS CAPSULE STUDY;  Surgeon: [name], MD;  Location: [facility] ENDOSCOPY;  Service: Gastroenterology;  Laterality: N/A;   HEMORRHOID SURGERY N/A 10/29/2016   Procedure: HEMORRHOIDECTOMY;  Surgeon: Leighton Ruff, MD;  Location: [facility];  Service: General;  Laterality: N/A;   MITRAL VALVE ANNULOPLASTY  10/23/1998   "Model [id number]; Campbell Lerner [id number]"; size 61mm; [facility]; Dr. Boyce Medici   PILONIDAL CYST EXCISION  1954   TEE WITH CARDIOVERSION  05-06-2006 at [facility];  01-02-2013 at [place] Left 05/04/2017   Procedure: LEFT TOTAL HIP ARTHROPLASTY ANTERIOR APPROACH;  Surgeon: Mcarthur Rossetti, MD;  Location: [place];  Service: Orthopedics;  Laterality: Left;   TRANSTHORACIC ECHOCARDIOGRAM  05-01-2015   dr Caryl Comes   ef 50-55%/  mild AV sclerosis without stenosis/  post MV repair with mild central MR (valve area by pressure half-time 2cm^2,  valve area by continutity equation 0.91cm^2, peak grandiant 32mmHg)/  severe LAE/ mild TR/ mild RAE    TUBAL LIGATION Bilateral 1978   Family History  Problem Relation Age of Onset   Lung cancer Father        smoker   Alcohol abuse Father    Cancer Father        bladder and lung CA smoker   Breast cancer Neg Hx    Social History   Socioeconomic History   Marital status: Widowed    Spouse name: Not on file   Number of children: 6   Years of education: Not on file   Highest education level: Not on file  Occupational History   Occupation: Architectural technologist: RETIRED  Social Designer, fashion/clothing strain: Not on file     Food insecurity    Worry: Not on file    Inability: Not on file   Transportation needs    Medical: Not on file    Non-medical: Not on file  Tobacco Use   Smoking status: Never Smoker   Smokeless tobacco: Never Used  Substance and Sexual Activity   Alcohol use: Yes    Alcohol/week: 0.0 standard drinks    Comment: seldom   Drug use: No   Sexual activity: Not Currently  Lifestyle   Physical activity    Days per week: Not on file    Minutes per session: Not on file   Stress: Not on file  Relationships  Social Herbalist on phone: Not on file    Gets together: Not on file    Attends religious service: Not on file    Active member of club or organization: Not on file    Attends meetings of clubs or organizations: Not on file    Relationship status: Not on file  Other Topics Concern   Not on file  Social History Narrative   Retired. Daily Caffeine use: 2 daily     Outpatient Encounter Medications as of 09/05/2018  Medication Sig   albuterol (PROVENTIL HFA;VENTOLIN HFA) 108 (90 Base) MCG/ACT inhaler Inhale 2 puffs into the lungs every 4 (four) hours as needed for wheezing or shortness of breath.   alendronate (FOSAMAX) 70 MG tablet Take 1 tablet (70 mg total) by mouth every 7 (seven) days. Take with a full glass of water on an empty stomach.   Cholecalciferol (VITAMIN D3) 2000 units TABS Take 4,000 Units by mouth daily.   clindamycin (CLEOCIN) 150 MG capsule TAKE 4 CAPSULES BY MOUTH 1 HR PRIOR TO DENTAL APPT   diphenhydrAMINE (BENADRYL) 25 mg capsule Take 50 mg by mouth every 6 (six) hours as needed for itching.   docusate sodium (COLACE) 100 MG capsule Take 1 capsule (100 mg total) by mouth daily.   enoxaparin (LOVENOX) 120 MG/0.8ML injection Inject 0.8 mLs (120 mg total) into the skin daily.   ferrous sulfate 325 (65 FE) MG EC tablet Take 1 tablet (325 mg total) by mouth daily with breakfast.   fluticasone (FLONASE) 50 MCG/ACT nasal spray Place 1  spray into both nostrils daily as needed for allergies.   furosemide (LASIX) 40 MG tablet TAKE 1/2 TABLET BY MOUTH DAILY, MAY INCREASE TO 1 TABLET EVERY DAY IF NEEDED   levothyroxine (SYNTHROID) 25 MCG tablet Take 1 tablet (25 mcg total) by mouth daily before breakfast.   linaclotide (LINZESS) 72 MCG capsule Take 1 capsule (72 mcg total) by mouth daily before breakfast.   loratadine (CLARITIN) 10 MG tablet Take 10 mg by mouth daily as needed for allergies.    meclizine (ANTIVERT) 25 MG tablet Take 1 tablet (25 mg total) by mouth 3 (three) times daily as needed for dizziness.   metoprolol tartrate (LOPRESSOR) 25 MG tablet TAKE 1 TABLET (25 MG TOTAL) BY MOUTH 3 (THREE) TIMES DAILY AS NEEDED. FOR FAST HEART RATE   Polyethyl Glycol-Propyl Glycol (LUBRICANT EYE DROPS) 0.4-0.3 % SOLN Place 1-2 drops into both eyes 3 (three) times daily as needed (for dry eyes.).   warfarin (COUMADIN) 5 MG tablet Take 1 tablet daily except take 1/2 tablet on Monday and Friday or TAKE AS DIRECTED BY ANTICOAGULATION CLINIC  90 day   diltiazem (CARDIZEM CD) 120 MG 24 hr capsule Take 1 capsule (120 mg total) by mouth 2 (two) times daily.   No facility-administered encounter medications on file as of 09/05/2018.     Activities of Daily Living In your present state of health, do you have any difficulty performing the following activities: 09/05/2018  Hearing? Y  Vision? N  Difficulty concentrating or making decisions? N  Walking or climbing stairs? N  Dressing or bathing? N  Doing errands, shopping? N  Preparing Food and eating ? N  Using the Toilet? N  In the past six months, have you accidently leaked urine? N  Do you have problems with loss of bowel control? N  Managing your Medications? N  Managing your Finances? N  Housekeeping or managing your Housekeeping? N  Some  recent data might be hidden    Patient Care Team: Tower, Wynelle Fanny, MD as PCP - General    Assessment:   This is a routine wellness  examination for [facility].   Hearing Screening   125Hz  250Hz  500Hz  1000Hz  2000Hz  3000Hz  4000Hz  6000Hz  8000Hz   Right ear:           Left ear:           Vision Screening Comments: Vision exam in 2020 @ [place] and Dietary recommendations Current Exercise Habits: Home exercise routine, Type of exercise: walking, Time (Minutes): 15, Frequency (Times/Week): 7, Weekly Exercise (Minutes/Week): 105, Intensity: Mild, Exercise limited by: cardiac condition(s)  Goals     Weight (lb) < 160 lb (72.6 kg)     Starting 09/05/2018, I will continue walk 10-15 minutes daily.        Fall Risk Fall Risk  09/05/2018 10/29/2017 09/01/2017 08/26/2016 05/15/2015  Falls in the past year? 0 No Yes No No  Comment - - pt tripped causing multiple bruises - -  Number falls in past yr: - - 1 - -  Injury with Fall? - - Yes - -   Depression Screen PHQ 2/9 Scores 09/05/2018 09/01/2017 08/26/2016 05/15/2015  PHQ - 2 Score 0 0 0 0  PHQ- 9 Score 0 0 - -     Cognitive Function MMSE - Mini Mental State Exam 09/05/2018 09/01/2017 08/26/2016  Orientation to time 5 5 5   Orientation to Place 5 5 5   Registration 3 3 3   Attention/ Calculation 0 0 0  Recall 3 3 2   Recall-comments - - pt was unable to recall 1 of 3 words  Language- name 2 objects 0 0 0  Language- repeat 1 1 1   Language- follow 3 step command 0 3 3  Language- read & follow direction 0 0 0  Write a sentence 0 0 0  Copy design 0 0 0  Total score 17 20 19      PLEASE NOTE: A Mini-Cog screen was completed. Maximum score is 17. A value of 0 denotes this part of [place] MMSE was not completed or the patient failed this part of the Mini-Cog screening.   Mini-Cog Screening Orientation to Time - Max 5 pts Orientation to Place - Max 5 pts Registration - Max 3 pts Recall - Max 3 pts Language Repeat - Max 1 pts      Immunization History  Administered Date(s) Administered   Influenza,inj,Quad PF,6+ Mos 01/19/2017   Pneumococcal  Conjugate-13 08/26/2016   Pneumococcal Polysaccharide-23 09/01/2017   Td 02/03/2005    Screening Tests Health Maintenance  Topic Date Due   TETANUS/TDAP  02/02/2025 (Originally 02/04/2015)   INFLUENZA VACCINE  10/22/2018   DEXA SCAN  Completed   PNA vac Low Risk Adult  Completed      Plan:     I have personally reviewed, addressed, and noted the following in the patients chart:  A. Medical and social history B. Use of alcohol, tobacco or illicit drugs  C. Current medications and supplements D. Functional ability and status E.  Nutritional status F.  Physical activity G. Advance directives H. List of other physicians I.  Hospitalizations, surgeries, and ER visits in previous 12 months J.  Vitals (unless it is a telemedicine encounter) K. Screenings to include cognitive, depression, hearing, vision (NOTE: hearing and vision screenings not completed in telemedicine encounter) L. Referrals and appointments   In addition, I have reviewed and discussed with  patient certain preventive protocols, quality metrics, and best practice recommendations. A written personalized care plan for preventive services and recommendations were provided to patient.  With patient's permission, we connected on 09/05/18 at 11:00 AM EDT. Interactive audio and video telecommunications were attempted with patient. This attempt was unsuccessful due to patient having technical difficulties OR patient did not have access to video capability.  Encounter was completed with audio only.  Two patient identifiers were used to ensure the encounter occurred with the correct person. Patient was in home and writer was in office.    Signed,   Lindell Noe, MHA, BS, RN Health Coach

## 2018-09-05 NOTE — Patient Instructions (Signed)
[name] , Thank you for taking time to come for your Medicare Wellness Visit. I appreciate your ongoing commitment to your health goals. Please review the following plan we discussed and let me know if I can assist you in the future.   These are the goals we discussed: Goals    . Weight (lb) < 160 lb (72.6 kg)     Starting 09/05/2018, I will continue walk 10-15 minutes daily.        This is a list of the screening recommended for you and due dates:  Health Maintenance  Topic Date Due  . Tetanus Vaccine  02/02/2025*  . Flu Shot  10/22/2018  . DEXA scan (bone density measurement)  Completed  . Pneumonia vaccines  Completed  *Topic was postponed. The date shown is not the original due date.  Preventive Care for Adults  A healthy lifestyle and preventive care can promote health and wellness. Preventive health guidelines for adults include the following key practices.  . A routine yearly physical is a good way to check with your health care provider about your health and preventive screening. It is a chance to share any concerns and updates on your health and to receive a thorough exam.  . Visit your dentist for a routine exam and preventive care every 6 months. Brush your teeth twice a day and floss once a day. Good oral hygiene prevents tooth decay and gum disease.  . The frequency of eye exams is based on your age, health, family medical history, use  of contact lenses, and other factors. Follow your health care provider's recommendations for frequency of eye exams.  . Eat a healthy diet. Foods like vegetables, fruits, whole grains, low-fat dairy products, and lean protein foods contain the nutrients you need without too many calories. Decrease your intake of foods high in solid fats, added sugars, and salt. Eat the right amount of calories for you. Get information about a proper diet from your health care provider, if necessary.  . Regular physical exercise is one of the most  important things you can do for your health. Most adults should get at least 150 minutes of moderate-intensity exercise (any activity that increases your heart rate and causes you to sweat) each week. In addition, most adults need muscle-strengthening exercises on 2 or more days a week.  Silver Sneakers may be a benefit available to you. To determine eligibility, you may visit the website: www.silversneakers.com or contact program at [phone] Mon-Fri between 8AM-8PM.   . Maintain a healthy weight. The body mass index (BMI) is a screening tool to identify possible weight problems. It provides an estimate of body fat based on height and weight. Your health care provider can find your BMI and can help you achieve or maintain a healthy weight.   For adults 20 years and older: ? A BMI below 18.5 is considered underweight. ? A BMI of 18.5 to 24.9 is normal. ? A BMI of 25 to 29.9 is considered overweight. ? A BMI of 30 and above is considered obese.   . Maintain normal blood lipids and cholesterol levels by exercising and minimizing your intake of saturated fat. Eat a balanced diet with plenty of fruit and vegetables. Blood tests for lipids and cholesterol should begin at age 24 and be repeated every 5 years. If your lipid or cholesterol levels are high, you are over 50, or you are at high risk for heart disease, you may need your cholesterol levels checked more  frequently. Ongoing high lipid and cholesterol levels should be treated with medicines if diet and exercise are not working.  . If you smoke, find out from your health care provider how to quit. If you do not use tobacco, please do not start.  . If you choose to drink alcohol, please do not consume more than 2 drinks per day. One drink is considered to be 12 ounces (355 mL) of beer, 5 ounces (148 mL) of wine, or 1.5 ounces (44 mL) of liquor.  . If you are 61-84 years old, ask your health care provider if you should take aspirin to prevent  strokes.  . Use sunscreen. Apply sunscreen liberally and repeatedly throughout the day. You should seek shade when your shadow is shorter than you. Protect yourself by wearing long sleeves, pants, a wide-brimmed hat, and sunglasses year round, whenever you are outdoors.  . Once a month, do a whole body skin exam, using a mirror to look at the skin on your back. Tell your health care provider of new moles, moles that have irregular borders, moles that are larger than a pencil eraser, or moles that have changed in shape or color.

## 2018-09-08 ENCOUNTER — Other Ambulatory Visit: Payer: Self-pay

## 2018-09-08 ENCOUNTER — Other Ambulatory Visit (INDEPENDENT_AMBULATORY_CARE_PROVIDER_SITE_OTHER): Payer: Medicare Other

## 2018-09-08 DIAGNOSIS — R600 Localized edema: Secondary | ICD-10-CM | POA: Diagnosis not present

## 2018-09-08 DIAGNOSIS — D500 Iron deficiency anemia secondary to blood loss (chronic): Secondary | ICD-10-CM

## 2018-09-08 DIAGNOSIS — E039 Hypothyroidism, unspecified: Secondary | ICD-10-CM | POA: Diagnosis not present

## 2018-09-08 DIAGNOSIS — E7800 Pure hypercholesterolemia, unspecified: Secondary | ICD-10-CM

## 2018-09-08 LAB — LIPID PANEL
Cholesterol: 198 mg/dL (ref 0–200)
HDL: 38.9 mg/dL — ABNORMAL LOW (ref >39.00)
LDL Cholesterol: 139 mg/dL — ABNORMAL HIGH (ref 0–99)
NonHDL: 158.66
Total CHOL/HDL Ratio: 5
Triglycerides: 97 mg/dL (ref 0.0–149.0)
VLDL: 19.4 mg/dL (ref 0.0–40.0)

## 2018-09-08 LAB — COMPREHENSIVE METABOLIC PANEL
ALT: 9 U/L (ref 0–35)
AST: 14 U/L (ref 0–37)
Albumin: 4 g/dL (ref 3.5–5.2)
Alkaline Phosphatase: 44 U/L (ref 39–117)
BUN: 16 mg/dL (ref 6–23)
CO2: 30 mEq/L (ref 19–32)
Calcium: 9.1 mg/dL (ref 8.4–10.5)
Chloride: 105 mEq/L (ref 96–112)
Creatinine, Ser: 0.84 mg/dL (ref 0.40–1.20)
GFR: 64.62 mL/min (ref >60.00)
Glucose, Bld: 89 mg/dL (ref 70–99)
Potassium: 4.5 mEq/L (ref 3.5–5.1)
Sodium: 142 mEq/L (ref 135–145)
Total Bilirubin: 0.6 mg/dL (ref 0.2–1.2)
Total Protein: 6.9 g/dL (ref 6.0–8.3)

## 2018-09-08 LAB — CBC WITH DIFFERENTIAL/PLATELET
Basophils Absolute: 0.1 10*3/uL (ref 0.0–0.1)
Basophils Relative: 1 % (ref 0.0–3.0)
Eosinophils Absolute: 0.3 10*3/uL (ref 0.0–0.7)
Eosinophils Relative: 5.4 % — ABNORMAL HIGH (ref 0.0–5.0)
HCT: 38.7 % (ref 36.0–46.0)
Hemoglobin: 12.5 g/dL (ref 12.0–15.0)
Lymphocytes Relative: 29.4 % (ref 12.0–46.0)
Lymphs Abs: 1.5 10*3/uL (ref 0.7–4.0)
MCHC: 32.4 g/dL (ref 30.0–36.0)
MCV: 86 fl (ref 78.0–100.0)
Monocytes Absolute: 0.5 10*3/uL (ref 0.1–1.0)
Monocytes Relative: 10.5 % (ref 3.0–12.0)
Neutro Abs: 2.7 10*3/uL (ref 1.4–7.7)
Neutrophils Relative %: 53.7 % (ref 43.0–77.0)
Platelets: 153 10*3/uL (ref 150.0–400.0)
RBC: 4.5 Mil/uL (ref 3.87–5.11)
RDW: 15.7 % — ABNORMAL HIGH (ref 11.5–15.5)
WBC: 5 10*3/uL (ref 4.0–10.5)

## 2018-09-08 LAB — TSH: TSH: 5.04 u[IU]/mL — ABNORMAL HIGH (ref 0.35–4.50)

## 2018-09-21 ENCOUNTER — Telehealth: Payer: Self-pay | Admitting: *Deleted

## 2018-09-21 NOTE — Telephone Encounter (Signed)
Please move patient's appt earlier. Labs prior , MD +/- IV iron

## 2018-09-21 NOTE — Telephone Encounter (Signed)
Patient called asking if she needs to be seen because she is having extreme fatigue like she had last year when she had to get blood transfusion. Next appointment is not until August Please asvise

## 2018-09-27 ENCOUNTER — Telehealth: Payer: Self-pay | Admitting: Family Medicine

## 2018-09-27 MED ORDER — ALENDRONATE SODIUM 70 MG PO TABS: 70.0000 mg | ORAL_TABLET | ORAL | 1 refills | Status: DC

## 2018-09-27 NOTE — Telephone Encounter (Signed)
Best number [phone]  Pt called Mcarthur Rossetti will be sending paperwork to get her  Alendronate  [facility] to [facility]

## 2018-09-27 NOTE — Telephone Encounter (Signed)
Rx sent and left VM letting pt know Rx was sent and to check with pharmacy to see if they need anything else on their end before they mail it to her

## 2018-09-29 ENCOUNTER — Other Ambulatory Visit: Payer: Self-pay

## 2018-09-29 ENCOUNTER — Inpatient Hospital Stay: Payer: Medicare Other | Attending: Oncology

## 2018-09-29 DIAGNOSIS — I4891 Unspecified atrial fibrillation: Secondary | ICD-10-CM | POA: Insufficient documentation

## 2018-09-29 DIAGNOSIS — R5382 Chronic fatigue, unspecified: Secondary | ICD-10-CM | POA: Insufficient documentation

## 2018-09-29 DIAGNOSIS — Z7901 Long term (current) use of anticoagulants: Secondary | ICD-10-CM | POA: Diagnosis not present

## 2018-09-29 DIAGNOSIS — D509 Iron deficiency anemia, unspecified: Secondary | ICD-10-CM | POA: Insufficient documentation

## 2018-09-29 DIAGNOSIS — D500 Iron deficiency anemia secondary to blood loss (chronic): Secondary | ICD-10-CM

## 2018-09-29 DIAGNOSIS — E039 Hypothyroidism, unspecified: Secondary | ICD-10-CM | POA: Insufficient documentation

## 2018-09-29 LAB — CBC WITH DIFFERENTIAL/PLATELET
Abs Immature Granulocytes: 0.01 10*3/uL (ref 0.00–0.07)
Basophils Absolute: 0.1 10*3/uL (ref 0.0–0.1)
Basophils Relative: 1 %
Eosinophils Absolute: 0.2 10*3/uL (ref 0.0–0.5)
Eosinophils Relative: 4 %
HCT: 37.8 % (ref 36.0–46.0)
Hemoglobin: 12.1 g/dL (ref 12.0–15.0)
Immature Granulocytes: 0 %
Lymphocytes Relative: 28 %
Lymphs Abs: 1.3 10*3/uL (ref 0.7–4.0)
MCH: 27.7 pg (ref 26.0–34.0)
MCHC: 32 g/dL (ref 30.0–36.0)
MCV: 86.5 fL (ref 80.0–100.0)
Monocytes Absolute: 0.5 10*3/uL (ref 0.1–1.0)
Monocytes Relative: 11 %
Neutro Abs: 2.6 10*3/uL (ref 1.7–7.7)
Neutrophils Relative %: 56 %
Platelets: 180 10*3/uL (ref 150–400)
RBC: 4.37 MIL/uL (ref 3.87–5.11)
RDW: 14.8 % (ref 11.5–15.5)
WBC: 4.6 10*3/uL (ref 4.0–10.5)
nRBC: 0 % (ref 0.0–0.2)

## 2018-09-29 LAB — IRON AND TIBC
Iron: 47 ug/dL (ref 28–170)
Saturation Ratios: 13 % (ref 10.4–31.8)
TIBC: 358 ug/dL (ref 250–450)
UIBC: 311 ug/dL

## 2018-09-29 LAB — FERRITIN: Ferritin: 73 ng/mL (ref 11–307)

## 2018-09-30 ENCOUNTER — Inpatient Hospital Stay: Payer: Medicare Other

## 2018-09-30 ENCOUNTER — Other Ambulatory Visit: Payer: Self-pay

## 2018-09-30 ENCOUNTER — Encounter: Payer: Self-pay | Admitting: Oncology

## 2018-09-30 ENCOUNTER — Inpatient Hospital Stay (HOSPITAL_BASED_OUTPATIENT_CLINIC_OR_DEPARTMENT_OTHER): Payer: Medicare Other | Admitting: Oncology

## 2018-09-30 VITALS — BP 120/71 | HR 87 | Temp 96.8°F | Wt 181.6 lb

## 2018-09-30 DIAGNOSIS — R5382 Chronic fatigue, unspecified: Secondary | ICD-10-CM

## 2018-09-30 DIAGNOSIS — D509 Iron deficiency anemia, unspecified: Secondary | ICD-10-CM

## 2018-09-30 DIAGNOSIS — E039 Hypothyroidism, unspecified: Secondary | ICD-10-CM | POA: Diagnosis not present

## 2018-09-30 DIAGNOSIS — D500 Iron deficiency anemia secondary to blood loss (chronic): Secondary | ICD-10-CM

## 2018-09-30 DIAGNOSIS — I4891 Unspecified atrial fibrillation: Secondary | ICD-10-CM

## 2018-09-30 DIAGNOSIS — R5383 Other fatigue: Secondary | ICD-10-CM

## 2018-09-30 DIAGNOSIS — Z7901 Long term (current) use of anticoagulants: Secondary | ICD-10-CM

## 2018-10-01 NOTE — Progress Notes (Signed)
Hematology/Oncology Consult note [facility] Telephone:([phone] [phone]   Patient Care Team: Tower, Wynelle Fanny, MD as PCP - General  REFERRING PROVIDER: Dr.Anna CHIEF COMPLAINTS/REASON FOR VISIT:  Evaluation of iron deficiency anemia.   HISTORY OF PRESENTING ILLNESS:  [name] is a  85 y.o.  female with PMH listed below who was referred to me for evaluation of iron deficiency anemia.  Patient reports having worsening of fatigue since her hip surgery on 05/04/2017.  Chronic A fib, on  coumadin.  09/20/2017 She presented to ER for rectal bleeding.  Lab work up showed hemoglobin 11.3, with MCV 77,  10/13/2017 Colonoscopy showed multiple polyps in the transverse colon and in the ascending colon, removed.  Submucosal nodule in distal rectum.  Upper endoscopy: normal esophagus, hiatal hernia, otherwise normal.  Pathology showed tubular adenoma. Negative for malignancy.  Duodenum biopsy negative for intraepithelial lymphocytosis, dysplasia and malignancy.   Further work up includes iron panel which was obtain on 10/14/2017. Consistent with iron deficiency anemia.  History of iron deficiency: denies Rectal bleeding: yes Menstrual bleeding/ Vaginal bleeding : post menopausal Hematemesis or hemoptysis : denies Blood in urine : denies  Pica: denies Fatigue: Yes.  SOB: deneis  10/13/2017 colonoscopy showed 12 polyps which were resected and retrieved.  Negative for high-grade dysplasia and malignancy.  Upper endoscopy showed a 4 cm hiatal hernia.  Examination was otherwise normal. Capsule study 12/10/2017 showed a few non bleeding AVM.    INTERVAL HISTORY [name] is a 85 y.o. female who has above history reviewed by me today presents for follow up visit for management of iron deficiency anemia.  Previously received IV Venofer. Patient reports feeling very tired.  She called and requested to move the follow-up appointment earlier. Denies seeing any  blood in the stool or black tarry stool. She has hypothyroidism and is on thyroid replacement. Recently had thyroid function checked which reviewed a TSH free elevated.  Followed up with PCP and has not addressed.  Plan is to repeat TSH in the next to 8 weeks. On chronic anticoagulation.  Appetite is good.  Weight has been stable  Review of Systems  Constitutional: Positive for malaise/fatigue. Negative for chills, fever and weight loss.  HENT: Negative for nosebleeds and sore throat.   Eyes: Negative for double vision, photophobia and redness.  Respiratory: Negative for cough, shortness of breath and wheezing.   Cardiovascular: Negative for chest pain, palpitations and orthopnea.  Gastrointestinal: Negative for abdominal pain, blood in stool, nausea and vomiting.  Genitourinary: Negative for dysuria.  Musculoskeletal: Negative for back pain, myalgias and neck pain.  Skin: Negative for itching and rash.  Neurological: Negative for dizziness, tingling and tremors.  Endo/Heme/Allergies: Negative for environmental allergies. Does not bruise/bleed easily.  Psychiatric/Behavioral: Negative for depression.    MEDICAL HISTORY:  Past Medical History:  Diagnosis Date  . Allergic rhinitis   . 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 Arthritis   . Atrial fibrillation -persistent cardiologist-  dr klein/  primary EP -- dr Tawanna Sat ([facility])   a. s/p PVI [name] 2010;  b. on tikosyn/coumadin;  c. 05/2009 Echo: EF 60-65%, Gr 2 DD. (first dx 09/ 2007)  . Bilateral lower extremity edema   . Bleeding hemorrhoid   . Carotid stenosis    mild (hosp 3/11)- consult by vasc/ Dr Donnetta Hutching  . Complication of anesthesia    hard to wake  . Diverticulosis of colon   . Dyspnea    on exertion-climbing  stairs  . Dysrhythmia   . Fatty liver   . H/O cardiac radiofrequency ablation    01/ 2008 at [facility] /  03/ 2010  at [facility]  . Heart failure with preserved ejection fraction ([place])   . Heart murmur    "prior  to valve repair"  . History of adenomatous polyp of colon    tubular adenoma's  . History of cardiomyopathy    secondary tachycardia-induced cardiomyopathy -- resolved 2014  . History of squamous cell carcinoma in situ (SCCIS) of skin    05/ 2017  nasal bridge and right medial knee  . History of transient ischemic attack (TIA)    01-24-2005 and 06-12-2009  . Hyperlipidemia   . Hypothyroidism   . Mild intermittent asthma    reacts to cats  . Mixed stress and urge urinary incontinence   . Pulmonary nodule   . S/P mitral valve repair 10-23-1998  dr Boyce Medici at [facility]   for MVP and regurg. (annuloplasty ring procedure)  . Swelling of left extremity 2017   states it's gotten worse  . Varicose vein of leg   . Wears glasses     SURGICAL HISTORY: Past Surgical History:  Procedure Laterality Date  . APPENDECTOMY  1978  . CARDIAC ELECTROPHYSIOLOGY [place] AND ABLATION  01/ 2008    at [facility]   right-sided ablation atrial flutter  . CARDIAC ELECTROPHYSIOLOGY STUDY AND ABLATION  03/ 2010   dr Jaymes Graff at [facility]   AV node ablation and pulmonary vein isolation for atrial fib  . CARDIOVERSION  06-18-2006;  07-13-2006;  10-19-2010;  10-27-2010  . COLONOSCOPY    . COLONOSCOPY WITH PROPOFOL N/A 10/13/2017   Procedure: COLONOSCOPY WITH PROPOFOL;  Surgeon: [name], MD;  Location[facility] ENDOSCOPY;  Service: Gastroenterology;  Laterality: N/A;  . CYSTO/ TRANSURETHRAL COLLAGEN INJECTION THERAPY  07-26-2007   dr Matilde Sprang  . DILATION AND CURETTAGE OF UTERUS    . ESOPHAGOGASTRODUODENOSCOPY (EGD) WITH PROPOFOL N/A 10/13/2017   Procedure: ESOPHAGOGASTRODUODENOSCOPY (EGD) WITH PROPOFOL;  Surgeon: [name], MD;  Location[facility] ENDOSCOPY;  Service: Gastroenterology;  Laterality: N/A;  . EXCISIONAL HEMORRHOIDECTOMY  1980s  . GIVENS CAPSULE STUDY N/A 12/08/2017   Procedure: GIVENS CAPSULE STUDY;  Surgeon: [name], MD;  Location: [facility] ENDOSCOPY;  Service: Gastroenterology;   Laterality: N/A;  . HEMORRHOID SURGERY N/A 10/29/2016   Procedure: HEMORRHOIDECTOMY;  Surgeon: Leighton Ruff, MD;  Location: [facility];  Service: General;  Laterality: N/A;  . MITRAL VALVE ANNULOPLASTY  10/23/1998   "Model 4625; [employer] [id number]"; size 40mm; [facility]; Dr. Boyce Medici  . PILONIDAL CYST EXCISION  1954  . TEE WITH CARDIOVERSION  05-06-2006 at [facility];  01-02-2013 at [facility]  . TOTAL HIP ARTHROPLASTY Left 05/04/2017   Procedure: LEFT TOTAL HIP ARTHROPLASTY ANTERIOR APPROACH;  Surgeon: Mcarthur Rossetti, MD;  Location: [facility];  Service: Orthopedics;  Laterality: Left;  . TRANSTHORACIC ECHOCARDIOGRAM  05-01-2015   dr Caryl Comes   ef 50-55%/  mild AV sclerosis without stenosis/  post MV repair with mild central MR (valve area by pressure half-time 2cm^2,  valve area by continutity equation 0.91cm^2, peak grandiant 37mmHg)/  severe LAE/ mild TR/ mild RAE   . TUBAL LIGATION Bilateral 1978    SOCIAL HISTORY: Social History   Socioeconomic History  . Marital status: Widowed    Spouse name: Not on file  . Number of children: 6  . Years of education: Not on file  . Highest education level: Not on file  Occupational History  . Occupation: Architectural technologist: RETIRED  Social Needs  . Financial resource strain: Not on file  . Food insecurity    Worry: Not on file    Inability: Not on file  . Transportation needs    Medical: Not on file    Non-medical: Not on file  Tobacco Use  . Smoking status: Never Smoker  . Smokeless tobacco: Never Used  Substance and Sexual Activity  . Alcohol use: Yes    Alcohol/week: 0.0 standard drinks    Comment: seldom  . Drug use: No  . Sexual activity: Not Currently  Lifestyle  . Physical activity    Days per week: Not on file    Minutes per session: Not on file  . Stress: Not on file  Relationships  . Social Herbalist on phone: Not on file    Gets together: Not on file    Attends religious service: Not on file     Active member of club or organization: Not on file    Attends meetings of clubs or organizations: Not on file    Relationship status: Not on file  . Intimate partner violence    Fear of current or ex partner: Not on file    Emotionally abused: Not on file    Physically abused: Not on file    Forced sexual activity: Not on file  Other Topics Concern  . Not on file  Social History Narrative   Retired. Daily Caffeine use: 2 daily     FAMILY HISTORY: Family History  Problem Relation Age of Onset  . Lung cancer Father        smoker  . Alcohol abuse Father   . Cancer Father        bladder and lung CA smoker  . Breast cancer Neg Hx     ALLERGIES:  is allergic to amiodarone hcl; penicillins; and statins.  MEDICATIONS:  Current Outpatient Medications  Medication Sig Dispense Refill  . albuterol (PROVENTIL HFA;VENTOLIN HFA) 108 (90 Base) MCG/ACT inhaler Inhale 2 puffs into the lungs every 4 (four) hours as needed for wheezing or shortness of breath. 1 Inhaler 0  . alendronate (FOSAMAX) 70 MG tablet Take 1 tablet (70 mg total) by mouth every 7 (seven) days. Take with a full glass of water on an empty stomach. 12 tablet 1  . Cholecalciferol (VITAMIN D3) 2000 units TABS Take 4,000 Units by mouth daily.    . clindamycin (CLEOCIN) 150 MG capsule TAKE 4 CAPSULES BY MOUTH 1 HR PRIOR TO DENTAL APPT  0  . diphenhydrAMINE (BENADRYL) 25 mg capsule Take 50 mg by mouth every 6 (six) hours as needed for itching.    . docusate sodium (COLACE) 100 MG capsule Take 1 capsule (100 mg total) by mouth daily. 30 capsule 3  . enoxaparin (LOVENOX) 120 MG/0.8ML injection Inject 0.8 mLs (120 mg total) into the skin daily. 6 Syringe 0  . ferrous sulfate 325 (65 FE) MG EC tablet Take 1 tablet (325 mg total) by mouth daily with breakfast. 30 tablet 3  . fluticasone (FLONASE) 50 MCG/ACT nasal spray Place 1 spray into both nostrils daily as needed for allergies.    . furosemide (LASIX) 40 MG tablet TAKE 1/2 TABLET BY  MOUTH DAILY, MAY INCREASE TO 1 TABLET EVERY DAY IF NEEDED 90 tablet 1  . levothyroxine (SYNTHROID) 25 MCG tablet Take 1 tablet (25 mcg total) by mouth daily before breakfast. 90 tablet 1  .  linaclotide (LINZESS) 72 MCG capsule Take 1 capsule (72 mcg total) by mouth daily before breakfast. 30 capsule 3  . loratadine (CLARITIN) 10 MG tablet Take 10 mg by mouth daily as needed for allergies.     [name] meclizine (ANTIVERT) 25 MG tablet Take 1 tablet (25 mg total) by mouth 3 (three) times daily as needed for dizziness. 30 tablet 0  . metoprolol tartrate (LOPRESSOR) 25 MG tablet TAKE 1 TABLET (25 MG TOTAL) BY MOUTH 3 (THREE) TIMES DAILY AS NEEDED. FOR FAST HEART RATE 60 tablet 5  . Polyethyl Glycol-Propyl Glycol (LUBRICANT EYE DROPS) 0.4-0.3 % SOLN Place 1-2 drops into both eyes 3 (three) times daily as needed (for dry eyes.).    [name] warfarin (COUMADIN) 5 MG tablet Take 1 tablet daily except take 1/2 tablet on Monday and Friday or TAKE AS DIRECTED BY ANTICOAGULATION CLINIC  90 day 90 tablet 1  . diltiazem (CARDIZEM CD) 120 MG 24 hr capsule Take 1 capsule (120 mg total) by mouth 2 (two) times daily. 180 capsule 3   No current facility-administered medications for this visit.      PHYSICAL EXAMINATION: ECOG PERFORMANCE STATUS: 1 - Symptomatic but completely ambulatory Vitals:   09/30/18 1320  BP: 120/71  Pulse: 87  Temp: (!) 96.8 F (36 C)   Filed Weights   09/30/18 1320  Weight: 181 lb 9 oz (82.4 kg)    Physical Exam Constitutional:      General: She is not in acute distress. HENT:     Head: Normocephalic and atraumatic.  Eyes:     General: No scleral icterus.    Pupils: Pupils are equal, round, and reactive to light.  Neck:     Musculoskeletal: Normal range of motion and neck supple.  Cardiovascular:     Rate and Rhythm: Normal rate and regular rhythm.     Heart sounds: Normal heart sounds.  Pulmonary:     Effort: Pulmonary effort is normal. No respiratory distress.     Breath sounds:  Normal breath sounds. No wheezing.  Abdominal:     General: Bowel sounds are normal. There is no distension.     Palpations: Abdomen is soft. There is no mass.     Tenderness: There is no abdominal tenderness.  Musculoskeletal: Normal range of motion.        General: No deformity.  Skin:    General: Skin is warm and dry.     Findings: No erythema or rash.  Neurological:     Mental Status: She is alert and oriented to person, place, and time.     Cranial Nerves: No cranial nerve deficit.     Coordination: Coordination normal.  Psychiatric:        Behavior: Behavior normal.        Thought Content: Thought content normal.      LABORATORY DATA:  I have reviewed the data as listed Lab Results  Component Value Date   WBC 4.6 09/29/2018   HGB 12.1 09/29/2018   HCT 37.8 09/29/2018   MCV 86.5 09/29/2018   PLT 180 09/29/2018   Recent Labs    02/04/18 1701 04/12/18 1540 09/08/18 0859  NA 141 141 142  K 4.5 3.7 4.5  CL 103 104 105  CO2 28 29 30   GLUCOSE 85 91 89  BUN 23 20 16   CREATININE 0.87 0.83 0.84  CALCIUM 9.2 9.1 9.1  GFRNONAA  --  >60  --   GFRAA  --  >60  --  PROT 7.3  --  6.9  ALBUMIN  --   --  4.0  AST 17  --  14  ALT 11  --  9  ALKPHOS  --   --  44  BILITOT 0.5  --  0.6   Iron/TIBC/Ferritin/ %Sat    Component Value Date/Time   IRON 47 09/29/2018 1102   TIBC 358 09/29/2018 1102   FERRITIN 73 09/29/2018 1102   IRONPCTSAT 13 09/29/2018 1102        ASSESSMENT & PLAN:  1. Iron deficiency anemia due to chronic blood loss   2. Chronic anticoagulation   3. Other fatigue   Labs reviewed and discussed with patient. CBC showed stable hemoglobin. Iron panel showed stable iron store.  Hold additional IV iron treatment.  Chronic anticoagulation on Coumadin for atrial fibrillation.  Tolerating well. I discussed with patient that the fatigue probably is not secondary to anemia, may be related to her depression or thyroid function. Advised patient to continue  follow-up with primary care physician for further evaluation and management. Continue take oral iron supplementation with ferrous sulfate 325 mg daily.  RTC CBC iron ferritin in 3 months and follow-up in clinic.  No orders of the defined types were placed in this encounter.   All questions were answered. The patient knows to call the clinic with any problems questions or concerns.  Return of visit: 6 months  Earlie Server, MD, PhD Hematology Oncology [facility] at [facility] Pager- [phone] 10/01/2018

## 2018-10-04 ENCOUNTER — Telehealth: Payer: Self-pay | Admitting: Internal Medicine

## 2018-10-04 ENCOUNTER — Ambulatory Visit: Payer: Medicare Other | Admitting: Internal Medicine

## 2018-10-04 NOTE — Telephone Encounter (Signed)

## 2018-10-05 ENCOUNTER — Ambulatory Visit (INDEPENDENT_AMBULATORY_CARE_PROVIDER_SITE_OTHER): Payer: Medicare Other

## 2018-10-05 ENCOUNTER — Ambulatory Visit (INDEPENDENT_AMBULATORY_CARE_PROVIDER_SITE_OTHER): Payer: Medicare Other | Admitting: Internal Medicine

## 2018-10-05 ENCOUNTER — Other Ambulatory Visit: Payer: Self-pay

## 2018-10-05 ENCOUNTER — Encounter: Payer: Self-pay | Admitting: Internal Medicine

## 2018-10-05 VITALS — BP 124/66 | HR 119 | Ht 65.0 in | Wt 184.4 lb

## 2018-10-05 DIAGNOSIS — I4821 Permanent atrial fibrillation: Secondary | ICD-10-CM | POA: Diagnosis not present

## 2018-10-05 DIAGNOSIS — I509 Heart failure, unspecified: Secondary | ICD-10-CM

## 2018-10-05 DIAGNOSIS — I10 Essential (primary) hypertension: Secondary | ICD-10-CM

## 2018-10-05 NOTE — Patient Instructions (Addendum)
Medication Instructions:   After wearing your monitor for 6 days, begin taking your metoprolol tartrate, 25mg  tablet, two times per day for 8 days. You will wear your monitor for a total of 14 days  Labwork: None ordered.  Testing/Procedures: Your physician has recommended that you wear an event monitor. Event monitors are medical devices that record the heart's electrical activity. Doctors most often [name] these monitors to diagnose arrhythmias. Arrhythmias are problems with the speed or rhythm of the heartbeat. The monitor is a small, portable device. You can wear one while you do your normal daily activities. This is usually used to diagnose what is causing palpitations/syncope (passing out).   Follow-Up: Your physician recommends that you schedule a follow-up appointment on   October 23 @ 9:30am with Dr. Caryl Comes   Any Other Special Instructions Will Be Listed Below (If Applicable).     If you need a refill on your cardiac medications before your next appointment, please call your pharmacy.

## 2018-10-05 NOTE — Progress Notes (Signed)
Electrophysiology Office Note   Date:  10/05/2018   ID:  [name], DOB Jul 21, 1932, MRN [id number]  PCP:  Abner Greenspan, MD  Cardiologist:   Primary Electrophysiologist:  Virl Axe, MD    No chief complaint on file.    History of Present Illness: [name] is a 85 y.o. female seen in followup electrophysiology evaluation for atrial fibrillation.    She has hx of atrial flutter ablation related to prior atriotomy undertaken at [facility] in March 7619 complicated by recurrent atrial arrhythmias prompting initiation of Tikosyn  This was stopped fall 2012 Recurrent symptoms prompted [name] to reinitiate Tikosyn.  Shes had recurrent episodes of atrial fibrillation associated with a rapid ventricular response.   She underwent cardioversion and reverted again to atrial fibrillation; it is now permanent She has decided to remain on warfarin  Increased HR in afib >> dilt increased from 180 qd >>120 bid and prn BB (stopped in the long-term because of alopecia)  She has undergone a lot of social change recently.  She has moved.  Her daughter-in-law died suddenly about June 28, 2022.  Devastating. The patient denies chest pain, nocturnal dyspnea, orthopnea or peripheral edema.  There have been no palpitations, lightheadedness or syncope  .  She continues to complain of fatigue however and exercise intolerance.  Also has a episodes of fatigue unrelated to meals or showers    DATE TEST EF   10/14 Echo   55-60 %   2/17 Echo   50-55 %   9/18 Echo  55-65%       Date Cr TSH Hgb  6/18    13.9  2/19 0.74  9.8  1/20 0.83 4.06(11/19) 12.6  6/20 0.84 5.04 12.1    Past Medical History:  Diagnosis Date  . Allergic rhinitis   . 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 Arthritis   . Atrial fibrillation -persistent cardiologist-  dr klein/  primary EP -- dr Tawanna Sat ([facility])   a. s/p PVI [name] 2010;  b. on tikosyn/coumadin;  c. 05/2009 Echo: EF 60-65%, Gr 2 DD. (first dx 09/ 2007)  . Bilateral lower  extremity edema   . Bleeding hemorrhoid   . Carotid stenosis    mild (hosp 3/11)- consult by vasc/ Dr Donnetta Hutching  . Complication of anesthesia    hard to wake  . Diverticulosis of colon   . Dyspnea    on exertion-climbing stairs  . Dysrhythmia   . Fatty liver   . H/O cardiac radiofrequency ablation    01/ 2008 at [facility] /  03/ 2010  at [facility]  . Heart failure with preserved ejection fraction ([place])   . Heart murmur    "prior to valve repair"  . History of adenomatous polyp of colon    tubular adenoma's  . History of cardiomyopathy    secondary tachycardia-induced cardiomyopathy -- resolved 2014  . History of squamous cell carcinoma in situ (SCCIS) of skin    05/ 2017  nasal bridge and right medial knee  . History of transient ischemic attack (TIA)    01-24-2005 and 06-12-2009  . Hyperlipidemia   . Hypothyroidism   . Mild intermittent asthma    reacts to cats  . Mixed stress and urge urinary incontinence   . Pulmonary nodule   . S/P mitral valve repair 10-23-1998  dr Boyce Medici at [facility]   for MVP and regurg. (annuloplasty ring procedure)  . Swelling of left extremity 2017   states it's gotten  worse  . Varicose vein of leg   . Wears glasses    Past Surgical History:  Procedure Laterality Date  . APPENDECTOMY  1978  . CARDIAC ELECTROPHYSIOLOGY [place] AND ABLATION  01/ 2008    at [place]   right-sided ablation atrial flutter  . CARDIAC ELECTROPHYSIOLOGY STUDY AND ABLATION  03/ 2010   dr Jaymes Graff at [facility]   AV node ablation and pulmonary vein isolation for atrial fib  . CARDIOVERSION  06-18-2006;  07-13-2006;  10-19-2010;  10-27-2010  . COLONOSCOPY    . COLONOSCOPY WITH PROPOFOL N/A 10/13/2017   Procedure: COLONOSCOPY WITH PROPOFOL;  Surgeon: [name], MD;  Location: [facility] ENDOSCOPY;  Service: Gastroenterology;  Laterality: N/A;  . CYSTO/ TRANSURETHRAL COLLAGEN INJECTION THERAPY  07-26-2007   dr Matilde Sprang  . DILATION AND CURETTAGE OF UTERUS     . ESOPHAGOGASTRODUODENOSCOPY (EGD) WITH PROPOFOL N/A 10/13/2017   Procedure: ESOPHAGOGASTRODUODENOSCOPY (EGD) WITH PROPOFOL;  Surgeon: [name], MD;  Location: [facility] ENDOSCOPY;  Service: Gastroenterology;  Laterality: N/A;  . EXCISIONAL HEMORRHOIDECTOMY  1980s  . GIVENS CAPSULE STUDY N/A 12/08/2017   Procedure: GIVENS CAPSULE STUDY;  Surgeon: [name], MD;  Location: [facility] ENDOSCOPY;  Service: Gastroenterology;  Laterality: N/A;  . HEMORRHOID SURGERY N/A 10/29/2016   Procedure: HEMORRHOIDECTOMY;  Surgeon: Leighton Ruff, MD;  Location: [facility];  Service: General;  Laterality: N/A;  . MITRAL VALVE ANNULOPLASTY  10/23/1998   "Model [id number]; [employer] Lerner [id number]"; size 95mm; [facility]; Dr. Boyce Medici  . PILONIDAL CYST EXCISION  1954  . TEE WITH CARDIOVERSION  05-06-2006 at [facility];  01-02-2013 at [facility]  . TOTAL HIP ARTHROPLASTY Left 05/04/2017   Procedure: LEFT TOTAL HIP ARTHROPLASTY ANTERIOR APPROACH;  Surgeon: Mcarthur Rossetti, MD;  Location: [place];  Service: Orthopedics;  Laterality: Left;  . TRANSTHORACIC ECHOCARDIOGRAM  05-01-2015   dr Caryl Comes   ef 50-55%/  mild AV sclerosis without stenosis/  post MV repair with mild central MR (valve area by pressure half-time 2cm^2,  valve area by continutity equation 0.91cm^2, peak grandiant 35mmHg)/  severe LAE/ mild TR/ mild RAE   . TUBAL LIGATION Bilateral 1978     Current Outpatient Medications  Medication Sig Dispense Refill  . albuterol (PROVENTIL HFA;VENTOLIN HFA) 108 (90 Base) MCG/ACT inhaler Inhale 2 puffs into the lungs every 4 (four) hours as needed for wheezing or shortness of breath. 1 Inhaler 0  . alendronate (FOSAMAX) 70 MG tablet Take 1 tablet (70 mg total) by mouth every 7 (seven) days. Take with a full glass of water on an empty stomach. 12 tablet 1  . Cholecalciferol (VITAMIN D3) 2000 units TABS Take 4,000 Units by mouth daily.    . clindamycin (CLEOCIN) 150 MG capsule TAKE 4 CAPSULES BY MOUTH 1 HR PRIOR TO DENTAL  APPT  0  . diltiazem (CARDIZEM CD) 120 MG 24 hr capsule Take 1 capsule (120 mg total) by mouth 2 (two) times daily. 180 capsule 3  . diphenhydrAMINE (BENADRYL) 25 mg capsule Take 50 mg by mouth every 6 (six) hours as needed for itching.    . docusate sodium (COLACE) 100 MG capsule Take 1 capsule (100 mg total) by mouth daily. 30 capsule 3  . ferrous sulfate 325 (65 FE) MG EC tablet Take 1 tablet (325 mg total) by mouth daily with breakfast. 30 tablet 3  . fluticasone (FLONASE) 50 MCG/ACT nasal spray Place 1 spray into both nostrils daily as needed for allergies.    . furosemide (LASIX) 40 MG tablet TAKE 1/2  TABLET BY MOUTH DAILY, MAY INCREASE TO 1 TABLET EVERY DAY IF NEEDED 90 tablet 1  . levothyroxine (SYNTHROID) 25 MCG tablet Take 1 tablet (25 mcg total) by mouth daily before breakfast. 90 tablet 1  . linaclotide (LINZESS) 72 MCG capsule Take 1 capsule (72 mcg total) by mouth daily before breakfast. 30 capsule 3  . loratadine (CLARITIN) 10 MG tablet Take 10 mg by mouth daily as needed for allergies.     [name] meclizine (ANTIVERT) 25 MG tablet Take 1 tablet (25 mg total) by mouth 3 (three) times daily as needed for dizziness. 30 tablet 0  . metoprolol tartrate (LOPRESSOR) 25 MG tablet TAKE 1 TABLET (25 MG TOTAL) BY MOUTH 3 (THREE) TIMES DAILY AS NEEDED. FOR FAST HEART RATE 60 tablet 5  . Polyethyl Glycol-Propyl Glycol (LUBRICANT EYE DROPS) 0.4-0.3 % SOLN Place 1-2 drops into both eyes 3 (three) times daily as needed (for dry eyes.).    [name] warfarin (COUMADIN) 5 MG tablet Take 1 tablet daily except take 1/2 tablet on Monday and Friday or TAKE AS DIRECTED BY ANTICOAGULATION CLINIC  90 day 90 tablet 1   No current facility-administered medications for this visit.     Allergies:   Amiodarone hcl, Penicillins, and Statins   Social History:  The patient  reports that she has never smoked. She has never used smokeless tobacco. She reports current alcohol use. She reports that she does not use drugs.    Family History:  The patient's family history includes Alcohol abuse in her father; Cancer in her father; Lung cancer in her father.    ROS:  Please see the history of present illness.  .   All other systems are reviewed and negative.    PHYSICAL EXAM: VS:  BP 124/66   Pulse (!) 119   Ht 5\' 5"  (1.651 m)   Wt 184 lb 6.4 oz (83.6 kg)   BMI 30.69 kg/m  , BMI Body mass index is 30.69 kg/m. Well developed and nourished in no acute distress HENT normal Neck supple with JVP-flat Carotids brisk a  Clear Irregularly irregular rate and rhythm with rapid ventricular response, no murmurs or gallops Abd-soft with active BS without hepatomegaly No Clubbing cyanosis edema Skin-warm and dry A & Oriented  Grossly normal sensory and motor function   EKG: Atrial fibrillation at 119   Recent Labs: 09/08/2018: ALT 9; BUN 16; Creatinine, Ser 0.84; Potassium 4.5; Sodium 142; TSH 5.04 09/29/2018: Hemoglobin 12.1; Platelets 180    Lipid Panel     Component Value Date/Time   CHOL 198 09/08/2018 0859   TRIG 97.0 09/08/2018 0859   HDL 38.90 (L) 09/08/2018 0859   CHOLHDL 5 09/08/2018 0859   VLDL 19.4 09/08/2018 0859   LDLCALC 139 (H) 09/08/2018 0859   LDLDIRECT 203.8 01/09/2008 0934     Wt Readings from Last 3 Encounters:  10/05/18 184 lb 6.4 oz (83.6 kg)  09/30/18 181 lb 9 oz (82.4 kg)  09/05/18 181 lb (82.1 kg)      Other studies Reviewed: Additional studies/ records that were reviewed today include: labs as noted  Review of the above records today demonstrates:    ASSESSMENT AND PLAN: Atrial fibrillation-permanent  Hypothyroidism-treated     Dypsnea on exertion   Anemia   Alopecia with beta-blockers    She continues to struggle with rapid atrial fibrillation.  We will undertake a metoprolol exposure trial, to ascertain whether she is better with a slower rate.  In the event that this is  the case we will can consider AV junction ablation and pacing.  TSH borderline  elevated   Labs/ tests ordered today include:    No orders of the defined types were placed in this encounter.  We spent more than 50% of our >25 min visit in face to face counseling regarding the above   So  Signed, Virl Axe, MD  10/05/2018 3:10 PM     [place] [address] [place] [address] [phone] (office) [phone] (fax)

## 2018-10-06 ENCOUNTER — Ambulatory Visit (INDEPENDENT_AMBULATORY_CARE_PROVIDER_SITE_OTHER): Payer: Medicare Other | Admitting: General Practice

## 2018-10-06 DIAGNOSIS — I4891 Unspecified atrial fibrillation: Secondary | ICD-10-CM

## 2018-10-06 DIAGNOSIS — Z7901 Long term (current) use of anticoagulants: Secondary | ICD-10-CM | POA: Diagnosis not present

## 2018-10-06 LAB — POCT INR: INR: 2.5 (ref 2.0–3.0)

## 2018-10-06 NOTE — Patient Instructions (Addendum)
Pre visit review using our clinic review tool, if applicable. No additional management support is needed unless otherwise documented below in the visit note.  Continue to take 1 pill (5mg ) daily EXCEPT for 1/2 pill (2.5mg ) on Mondays and Fridays.   Re-check in 6 weeks.

## 2018-10-11 ENCOUNTER — Telehealth: Payer: Self-pay | Admitting: Family Medicine

## 2018-10-11 ENCOUNTER — Other Ambulatory Visit: Payer: Self-pay | Admitting: General Practice

## 2018-10-11 DIAGNOSIS — Z5181 Encounter for therapeutic drug level monitoring: Secondary | ICD-10-CM

## 2018-10-11 MED ORDER — WARFARIN SODIUM 5 MG PO TABS: ORAL_TABLET | ORAL | 1 refills | Status: DC

## 2018-10-11 NOTE — Telephone Encounter (Signed)
Best number [phone]  Pt needs refill on warfarin  [facility] pharmacy

## 2018-10-11 NOTE — Telephone Encounter (Signed)
Filled.  Sent to [facility]

## 2018-10-11 NOTE — Telephone Encounter (Signed)
Thank you Cindy.

## 2018-10-11 NOTE — Telephone Encounter (Signed)
Routing to coumadin nurses

## 2018-10-24 DIAGNOSIS — I4821 Permanent atrial fibrillation: Secondary | ICD-10-CM | POA: Diagnosis not present

## 2018-10-26 ENCOUNTER — Telehealth: Payer: Self-pay | Admitting: Family Medicine

## 2018-10-26 DIAGNOSIS — E039 Hypothyroidism, unspecified: Secondary | ICD-10-CM

## 2018-10-26 NOTE — Telephone Encounter (Signed)
-----   Message from Ellamae Sia sent at 10/20/2018 10:57 AM EDT ----- Regarding: Lab orders for Thursday, 8.6.20 Lab orders

## 2018-10-27 ENCOUNTER — Other Ambulatory Visit: Payer: Self-pay

## 2018-10-27 ENCOUNTER — Other Ambulatory Visit (INDEPENDENT_AMBULATORY_CARE_PROVIDER_SITE_OTHER): Payer: Medicare Other

## 2018-10-27 DIAGNOSIS — D500 Iron deficiency anemia secondary to blood loss (chronic): Secondary | ICD-10-CM

## 2018-10-27 DIAGNOSIS — E039 Hypothyroidism, unspecified: Secondary | ICD-10-CM | POA: Diagnosis not present

## 2018-10-27 DIAGNOSIS — Z7901 Long term (current) use of anticoagulants: Secondary | ICD-10-CM

## 2018-10-27 DIAGNOSIS — R5383 Other fatigue: Secondary | ICD-10-CM

## 2018-10-27 LAB — TSH: TSH: 3.74 u[IU]/mL (ref 0.35–4.50)

## 2018-10-27 NOTE — Addendum Note (Signed)
Addended by: Ellamae Sia on: 10/27/2018 08:36 AM   Modules accepted: Orders

## 2018-10-28 ENCOUNTER — Other Ambulatory Visit: Payer: Medicare Other

## 2018-10-28 ENCOUNTER — Ambulatory Visit: Payer: Medicare Other | Admitting: Oncology

## 2018-11-03 ENCOUNTER — Other Ambulatory Visit: Payer: Medicare Other

## 2018-11-04 ENCOUNTER — Ambulatory Visit: Payer: Medicare Other | Admitting: Oncology

## 2018-11-04 ENCOUNTER — Other Ambulatory Visit: Payer: Medicare Other

## 2018-11-04 ENCOUNTER — Ambulatory Visit: Payer: Medicare Other

## 2018-11-10 IMAGING — MG MM DIGITAL SCREENING BILAT W/ CAD
5 series · 5 of 5 positions shown · non-contrast
Comparison: Previous exam(s).

CLINICAL DATA: Screening.

EXAM:
DIGITAL SCREENING BILATERAL MAMMOGRAM WITH CAD

[L MLO]
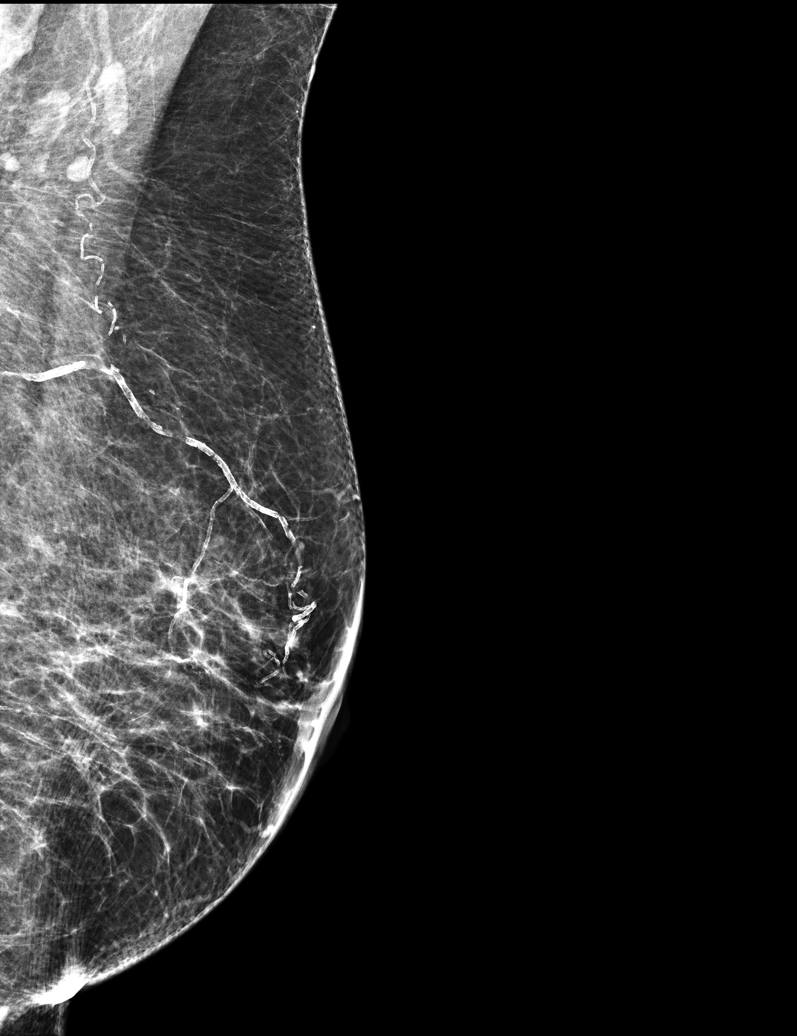

[L CC (1 of 2)]
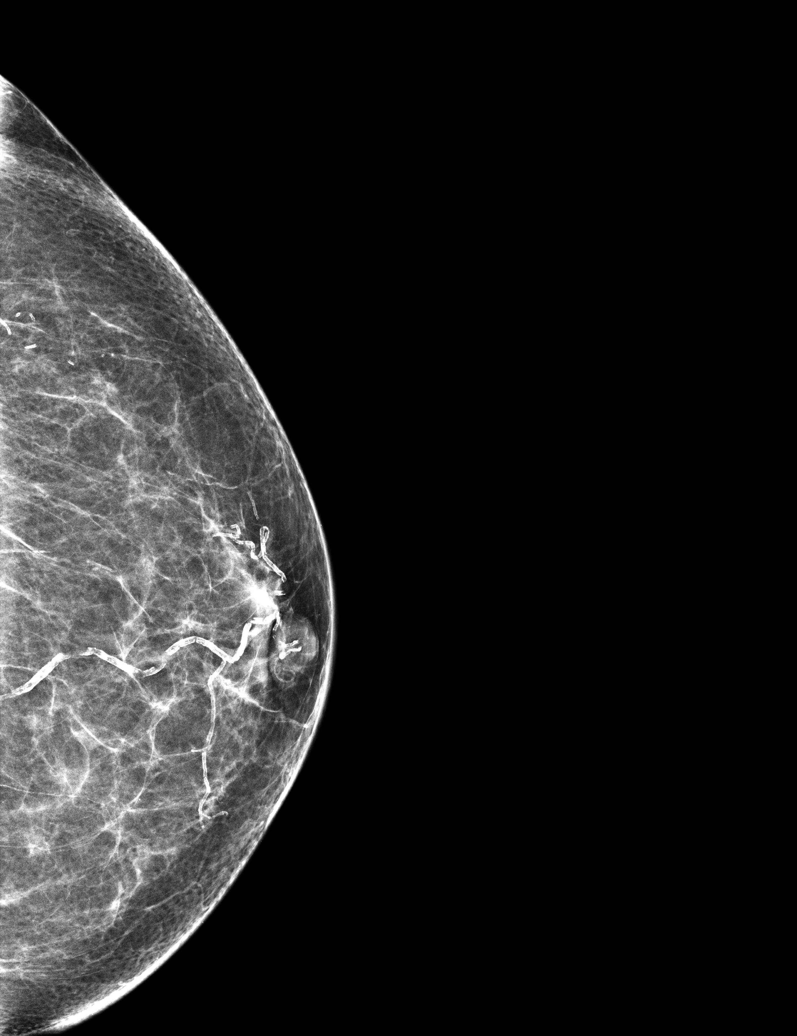

[R CC]
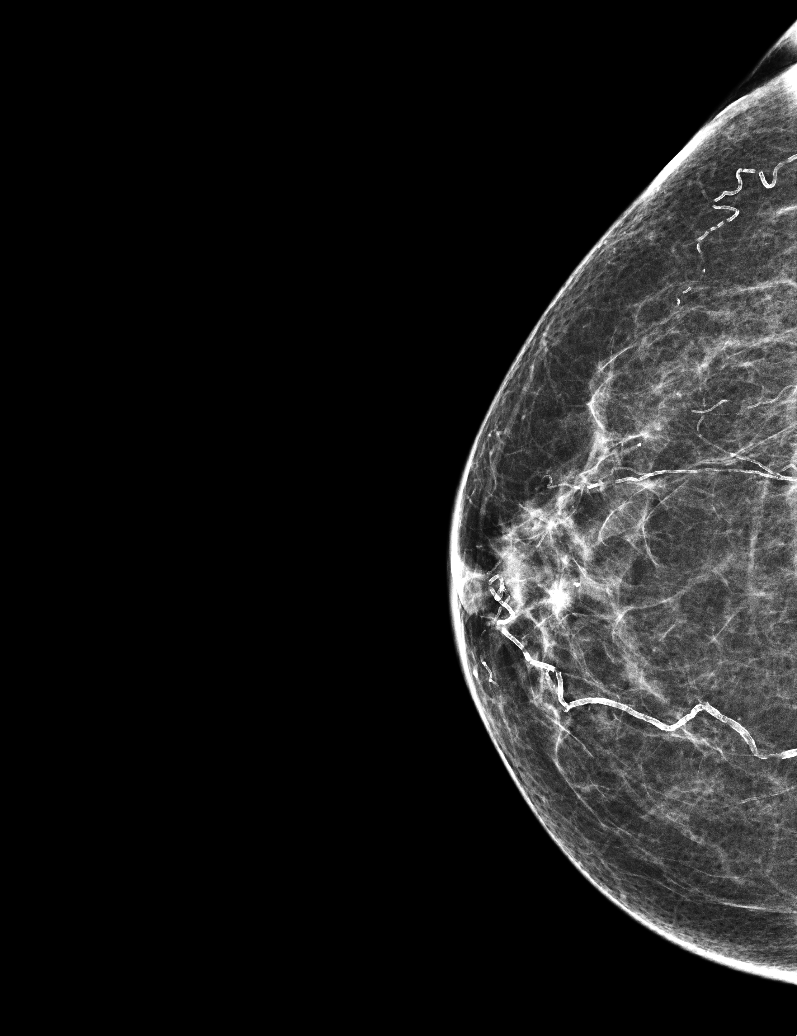

[L CC (2 of 2)]
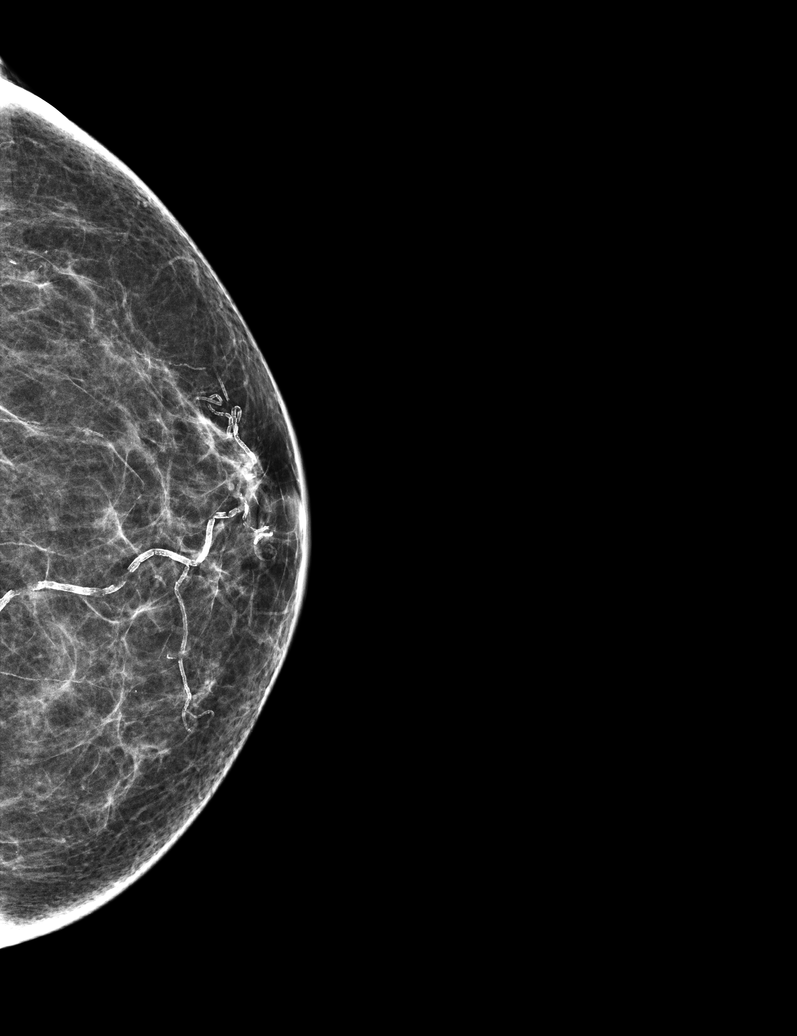

[R MLO]
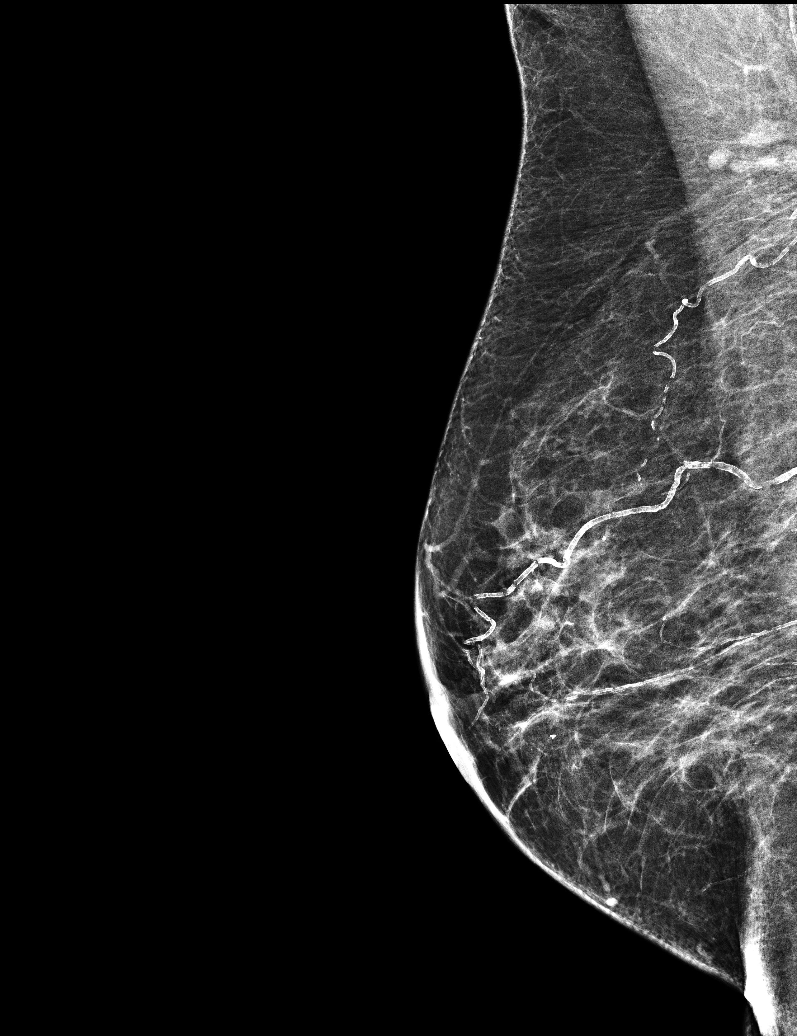

[5 of 5 positions shown; findings below may reference images not displayed]

ACR Breast Density Category b: There are scattered areas of
fibroglandular density.
FINDINGS: There are no findings suspicious for malignancy. Images were
processed with CAD.
IMPRESSION: No mammographic evidence of malignancy. A result letter of this
screening mammogram will be mailed directly to the patient.

RECOMMENDATION:
Screening mammogram in one year. (Code:AS-G-LCT)

BI-RADS CATEGORY  1: Negative.

## 2018-11-16 ENCOUNTER — Telehealth: Payer: Self-pay | Admitting: *Deleted

## 2018-11-16 NOTE — Telephone Encounter (Signed)
Left message on voicemail for patient to call back. When patient calls back need to confirm coumadin check tomorrow. Advise patient to come to the front of the building, call the office, perform the covid screening.

## 2018-11-17 ENCOUNTER — Other Ambulatory Visit: Payer: Self-pay

## 2018-11-17 ENCOUNTER — Ambulatory Visit (INDEPENDENT_AMBULATORY_CARE_PROVIDER_SITE_OTHER): Payer: Medicare Other | Admitting: General Practice

## 2018-11-17 DIAGNOSIS — I4891 Unspecified atrial fibrillation: Secondary | ICD-10-CM

## 2018-11-17 DIAGNOSIS — Z7901 Long term (current) use of anticoagulants: Secondary | ICD-10-CM

## 2018-11-17 LAB — POCT INR: INR: 2.4 (ref 2.0–3.0)

## 2018-11-17 MED ORDER — DILTIAZEM HCL ER COATED BEADS 120 MG PO CP24: 120.0000 mg | ORAL_CAPSULE | Freq: Two times a day (BID) | ORAL | 3 refills | Status: DC

## 2018-11-17 MED ORDER — METOPROLOL TARTRATE 25 MG PO TABS: 25.0000 mg | ORAL_TABLET | Freq: Two times a day (BID) | ORAL | 3 refills | Status: DC | PRN

## 2018-11-17 NOTE — Patient Instructions (Addendum)
Pre visit review using our clinic review tool, if applicable. No additional management support is needed unless otherwise documented below in the visit note.  Continue to take 1 pill (5mg) daily EXCEPT for 1/2 pill (2.5mg) on Mondays and Fridays.   Re-check in 6 weeks.    

## 2018-11-18 ENCOUNTER — Telehealth: Payer: Self-pay | Admitting: Internal Medicine

## 2018-11-18 NOTE — Telephone Encounter (Signed)
A Noted  Thanks SK

## 2018-11-18 NOTE — Telephone Encounter (Signed)
Patient called about her holter monitor test results, she said that wearing a mask is stressful for her when she goes out.   She doesn't want to be on extra medication if the reading of fast HR was during the day when she was out wearing a mask.

## 2018-11-18 NOTE — Telephone Encounter (Signed)
Pt advised her heart rate was not consistently during her "run around" time 9-10 am... she was worried that wearing a mask could have caused her heart rate to go up since she has a hard time breathing with it... I advised her that the recordings for her fast heart rate seemed to be more random times throughout the day.. she says she wanted to be sure before staring the Metoprolol... but she is happy to go on it and see how she does and will call us if she has any further questions or problems.

## 2018-11-21 ENCOUNTER — Emergency Department
Admission: EM | Admit: 2018-11-21 | Discharge: 2018-11-21 | Disposition: A | Discharge status: Home / Self Care | Payer: Medicare Other | Attending: Emergency Medicine | Admitting: Emergency Medicine

## 2018-11-21 ENCOUNTER — Emergency Department: Payer: Medicare Other

## 2018-11-21 ENCOUNTER — Other Ambulatory Visit: Payer: Self-pay

## 2018-11-21 ENCOUNTER — Telehealth: Payer: Self-pay

## 2018-11-21 DIAGNOSIS — J4520 Mild intermittent asthma, uncomplicated: Secondary | ICD-10-CM | POA: Insufficient documentation

## 2018-11-21 DIAGNOSIS — Z7901 Long term (current) use of anticoagulants: Secondary | ICD-10-CM | POA: Insufficient documentation

## 2018-11-21 DIAGNOSIS — Z79899 Other long term (current) drug therapy: Secondary | ICD-10-CM | POA: Insufficient documentation

## 2018-11-21 DIAGNOSIS — R531 Weakness: Secondary | ICD-10-CM | POA: Diagnosis not present

## 2018-11-21 DIAGNOSIS — R2681 Unsteadiness on feet: Secondary | ICD-10-CM | POA: Insufficient documentation

## 2018-11-21 DIAGNOSIS — R202 Paresthesia of skin: Secondary | ICD-10-CM | POA: Diagnosis not present

## 2018-11-21 DIAGNOSIS — I4891 Unspecified atrial fibrillation: Secondary | ICD-10-CM | POA: Diagnosis not present

## 2018-11-21 DIAGNOSIS — R29810 Facial weakness: Secondary | ICD-10-CM | POA: Diagnosis not present

## 2018-11-21 DIAGNOSIS — E039 Hypothyroidism, unspecified: Secondary | ICD-10-CM | POA: Diagnosis not present

## 2018-11-21 DIAGNOSIS — I4819 Other persistent atrial fibrillation: Secondary | ICD-10-CM | POA: Insufficient documentation

## 2018-11-21 DIAGNOSIS — I509 Heart failure, unspecified: Secondary | ICD-10-CM | POA: Insufficient documentation

## 2018-11-21 LAB — PROTIME-INR
INR: 2.2 — ABNORMAL HIGH (ref 0.8–1.2)
Prothrombin Time: 24.4 seconds — ABNORMAL HIGH (ref 11.4–15.2)

## 2018-11-21 LAB — CBC WITH DIFFERENTIAL/PLATELET
Abs Immature Granulocytes: 0.03 10*3/uL (ref 0.00–0.07)
Basophils Absolute: 0.1 10*3/uL (ref 0.0–0.1)
Basophils Relative: 1 %
Eosinophils Absolute: 0.1 10*3/uL (ref 0.0–0.5)
Eosinophils Relative: 3 %
HCT: 39.3 % (ref 36.0–46.0)
Hemoglobin: 12.4 g/dL (ref 12.0–15.0)
Immature Granulocytes: 1 %
Lymphocytes Relative: 26 %
Lymphs Abs: 1.3 10*3/uL (ref 0.7–4.0)
MCH: 27.4 pg (ref 26.0–34.0)
MCHC: 31.6 g/dL (ref 30.0–36.0)
MCV: 86.9 fL (ref 80.0–100.0)
Monocytes Absolute: 0.5 10*3/uL (ref 0.1–1.0)
Monocytes Relative: 10 %
Neutro Abs: 2.9 10*3/uL (ref 1.7–7.7)
Neutrophils Relative %: 59 %
Platelets: 169 10*3/uL (ref 150–400)
RBC: 4.52 MIL/uL (ref 3.87–5.11)
RDW: 15 % (ref 11.5–15.5)
WBC: 4.9 10*3/uL (ref 4.0–10.5)
nRBC: 0 % (ref 0.0–0.2)

## 2018-11-21 LAB — BASIC METABOLIC PANEL
Anion gap: 9 (ref 5–15)
BUN: 25 mg/dL — ABNORMAL HIGH (ref 8–23)
CO2: 28 mmol/L (ref 22–32)
Calcium: 9.1 mg/dL (ref 8.9–10.3)
Chloride: 105 mmol/L (ref 98–111)
Creatinine, Ser: 0.8 mg/dL (ref 0.44–1.00)
GFR calc Af Amer: >60 mL/min (ref >60)
GFR calc non Af Amer: >60 mL/min (ref >60)
Glucose, Bld: 133 mg/dL — ABNORMAL HIGH (ref 70–99)
Potassium: 4 mmol/L (ref 3.5–5.1)
Sodium: 142 mmol/L (ref 135–145)

## 2018-11-21 LAB — TROPONIN I (HIGH SENSITIVITY): Troponin I (High Sensitivity): 5 ng/L (ref <18)

## 2018-11-21 NOTE — ED Notes (Signed)
Pt otf for mri

## 2018-11-21 NOTE — Telephone Encounter (Signed)
Pt left v/m that she has tingling in face and lightheadedness; I was unable to reach pt; I spoke with pts son and he said pt was at [facility] ED now.

## 2018-11-21 NOTE — ED Triage Notes (Signed)
PT arrives via [facility] for tingling in the left side of her face x 2-3 days. Pt feels like her heart has been fluttering today, haas hx of a-fib. Pt also reports "heaviness" in right side of face that started today. Denies CP. Pt takes warfarin. Pt arrives in NAD, A&Ox4. Speech is clear

## 2018-11-21 NOTE — Discharge Instructions (Addendum)
Your lab results and MRI of the brain were all okay today. Please follow up with your primary care doctor for continued monitoring of your symptoms.

## 2018-11-21 NOTE — ED Provider Notes (Signed)
[facility] Emergency Department Provider Note  ____________________________________________  Time seen: Approximately 2:55 PM  I have reviewed the triage vital signs and the nursing notes.   HISTORY  Chief Complaint Numbness    HPI [name] is a 85 y.o. female with a history of chronic atrial fibrillation, carotid stenosis, fatty liver who comes to the ED complaining of intermittent left-sided facial tingling for the past 3 days.  No aggravating or alleviating factors, nonradiating.  No vision changes headaches numbness tingling or weakness in extremities.  No dizziness or syncope.  Also has a feeling of being unsteady on her feet.  Reviewing electronic medical record, her symptoms are very similar to prior ED presentation in January 2020.  After that she went to cardiology and ENT and had reassuring evaluations.  Her symptoms were much improved after ENT performed otolith repositioning maneuver.   Denies chest pain shortness of breath palpitations or syncope.     Past Medical History:  Diagnosis Date  . Allergic rhinitis   . 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 Arthritis   . Atrial fibrillation -persistent cardiologist-  dr klein/  primary EP -- dr Tawanna Sat ([facility])   a. s/p PVI [name] 2010;  b. on tikosyn/coumadin;  c. 05/2009 Echo: EF 60-65%, Gr 2 DD. (first dx 09/ 2007)  . Bilateral lower extremity edema   . Bleeding hemorrhoid   . Carotid stenosis    mild (hosp 3/11)- consult by vasc/ Dr Donnetta Hutching  . Complication of anesthesia    hard to wake  . Diverticulosis of colon   . Dyspnea    on exertion-climbing stairs  . Dysrhythmia   . Fatty liver   . H/O cardiac radiofrequency ablation    01/ 2008 at [place] [facility] [place] /  03/ 2010  at [facility]  . Heart failure with preserved ejection fraction ([place])   . Heart murmur    "prior to valve repair"  . History of adenomatous polyp of colon    tubular adenoma's  . History of cardiomyopathy    secondary  tachycardia-induced cardiomyopathy -- resolved 2014  . History of squamous cell carcinoma in situ (SCCIS) of skin    05/ 2017  nasal bridge and right medial knee  . History of transient ischemic attack (TIA)    01-24-2005 and 06-12-2009  . Hyperlipidemia   . Hypothyroidism   . Mild intermittent asthma    reacts to cats  . Mixed stress and urge urinary incontinence   . Pulmonary nodule   . S/P mitral valve repair 10-23-1998  dr Boyce Medici at [facility]   for MVP and regurg. (annuloplasty ring procedure)  . Swelling of left extremity 2017   states it's gotten worse  . Varicose vein of leg   . Wears glasses      Patient Active Problem List   Diagnosis Date Noted  . Dysuria 02/06/2018  . Iron deficiency anemia 10/21/2017  . Rapid atrial fibrillation ([place]) 08/19/2017  . Constipation 08/02/2017  . Numbness and tingling 07/14/2017  . Paresthesia 07/14/2017  . Unilateral primary osteoarthritis, left hip 05/04/2017  . Status post total replacement of left hip 05/04/2017  . Hip osteoarthritis 04/27/2017  . Long term (current) use of anticoagulants 03/04/2017  . Venous stasis dermatitis of both lower extremities 01/08/2017  . Impacted cerumen of right ear 11/20/2016  . Osteopenia 10/25/2016  . Pedal edema 08/26/2016  . Varicose veins of both lower extremities 08/26/2016  . Estrogen deficiency 08/26/2016  . Screening mammogram,  encounter for 08/26/2016  . Hemorrhoids 08/26/2016  . History of nonmelanoma skin cancer 01/01/2016  . Hip pain 08/02/2014  . Left knee pain 08/02/2014  . Chronic cough 05/08/2014  . Caregiver stress 08/16/2013  . Colon cancer screening 08/16/2013  . Encounter for therapeutic drug monitoring 04/20/2013  . Left ovarian cyst 03/14/2013  . (HFpEF) heart failure with preserved ejection fraction ([place]) 12/27/2012  . Cardiomyopathy, secondary --Resolved again 10/14 10/13/2010  . COLONIC POLYPS, ADENOMATOUS, HX OF 09/18/2009  . PULMONARY NODULE 12/20/2008   . GANGLION CYST 10/04/2007  . Hyperlipidemia 04/27/2007  . Depression with anxiety 04/27/2007  . Asthma, mild intermittent 04/27/2007  . INSOMNIA 04/27/2007  . ADENOMATOUS COLONIC POLYP 11/04/2006  . Hypothyroidism 09/02/2006  . Atrial fibrillation ([place]) 08/05/2006     Past Surgical History:  Procedure Laterality Date  . APPENDECTOMY  1978  . CARDIAC ELECTROPHYSIOLOGY [place] AND ABLATION  01/ 2008    at [place]   right-sided ablation atrial flutter  . CARDIAC ELECTROPHYSIOLOGY STUDY AND ABLATION  03/ 2010   dr Jaymes Graff at [facility]   AV node ablation and pulmonary vein isolation for atrial fib  . CARDIOVERSION  06-18-2006;  07-13-2006;  10-19-2010;  10-27-2010  . COLONOSCOPY    . COLONOSCOPY WITH PROPOFOL N/A 10/13/2017   Procedure: COLONOSCOPY WITH PROPOFOL;  Surgeon: [name], MD;  Location: [facility] ENDOSCOPY;  Service: Gastroenterology;  Laterality: N/A;  . CYSTO/ TRANSURETHRAL COLLAGEN INJECTION THERAPY  07-26-2007   dr Matilde Sprang  . DILATION AND CURETTAGE OF UTERUS    . ESOPHAGOGASTRODUODENOSCOPY (EGD) WITH PROPOFOL N/A 10/13/2017   Procedure: ESOPHAGOGASTRODUODENOSCOPY (EGD) WITH PROPOFOL;  Surgeon: [name], MD;  Location: [facility] ENDOSCOPY;  Service: Gastroenterology;  Laterality: N/A;  . EXCISIONAL HEMORRHOIDECTOMY  1980s  . GIVENS CAPSULE STUDY N/A 12/08/2017   Procedure: GIVENS CAPSULE STUDY;  Surgeon: [name], MD;  Location: [facility] ENDOSCOPY;  Service: Gastroenterology;  Laterality: N/A;  . HEMORRHOID SURGERY N/A 10/29/2016   Procedure: HEMORRHOIDECTOMY;  Surgeon: Leighton Ruff, MD;  Location: [facility];  Service: General;  Laterality: N/A;  . MITRAL VALVE ANNULOPLASTY  10/23/1998   "Model [id number]; Campbell Lerner [id number]"; size 47mm; [facility]; Dr. Boyce Medici  . PILONIDAL CYST EXCISION  1954  . TEE WITH CARDIOVERSION  05-06-2006 at [facility];  01-02-2013 at [facility]  . TOTAL HIP ARTHROPLASTY Left 05/04/2017   Procedure: LEFT TOTAL HIP ARTHROPLASTY ANTERIOR  APPROACH;  Surgeon: Mcarthur Rossetti, MD;  Location: [name];  Service: Orthopedics;  Laterality: Left;  . TRANSTHORACIC ECHOCARDIOGRAM  05-01-2015   dr Caryl Comes   ef 50-55%/  mild AV sclerosis without stenosis/  post MV repair with mild central MR (valve area by pressure half-time 2cm^2,  valve area by continutity equation 0.91cm^2, peak grandiant 59mmHg)/  severe LAE/ mild TR/ mild RAE   . TUBAL LIGATION Bilateral 1978     Prior to Admission medications   Medication Sig Start Date End Date Taking? Authorizing Provider  albuterol (PROVENTIL HFA;VENTOLIN HFA) 108 (90 Base) MCG/ACT inhaler Inhale 2 puffs into the lungs every 4 (four) hours as needed for wheezing or shortness of breath. 06/23/16   Tower, Wynelle Fanny, MD  alendronate (FOSAMAX) 70 MG tablet Take 1 tablet (70 mg total) by mouth every 7 (seven) days. Take with a full glass of water on an empty stomach. 09/27/18   Tower, Wynelle Fanny, MD  Cholecalciferol (VITAMIN D3) 2000 units TABS Take 4,000 Units by mouth daily.    [provider]  clindamycin (CLEOCIN) 150 MG capsule TAKE  4 CAPSULES BY MOUTH 1 HR PRIOR TO DENTAL APPT 09/06/17   [provider]  diltiazem (CARDIZEM CD) 120 MG 24 hr capsule Take 1 capsule (120 mg total) by mouth 2 (two) times daily. 11/17/18   Deboraha Sprang, MD  diphenhydrAMINE (BENADRYL) 25 mg capsule Take 50 mg by mouth every 6 (six) hours as needed for itching.    [provider]  docusate sodium (COLACE) 100 MG capsule Take 1 capsule (100 mg total) by mouth daily. 12/23/17   Earlie Server, MD  ferrous sulfate 325 (65 FE) MG EC tablet Take 1 tablet (325 mg total) by mouth daily with breakfast. 12/23/17   Earlie Server, MD  fluticasone [facility]) 50 MCG/ACT nasal spray Place 1 spray into both nostrils daily as needed for allergies.    [provider]  furosemide (LASIX) 40 MG tablet TAKE 1/2 TABLET BY MOUTH DAILY, MAY INCREASE TO 1 TABLET EVERY DAY IF NEEDED 08/03/18   Deboraha Sprang, MD  levothyroxine  (SYNTHROID) 25 MCG tablet Take 1 tablet (25 mcg total) by mouth daily before breakfast. 07/11/18   Tower, Wynelle Fanny, MD  linaclotide [facility]) 72 MCG capsule Take 1 capsule (72 mcg total) by mouth daily before breakfast. 11/11/17   [name], MD  loratadine (CLARITIN) 10 MG tablet Take 10 mg by mouth daily as needed for allergies.     [provider]  meclizine (ANTIVERT) 25 MG tablet Take 1 tablet (25 mg total) by mouth 3 (three) times daily as needed for dizziness. 04/12/18   Rudene Re, MD  metoprolol tartrate (LOPRESSOR) 25 MG tablet Take 1 tablet (25 mg total) by mouth 2 (two) times daily as needed. For fast heart rate 11/17/18   Deboraha Sprang, MD  Polyethyl Glycol-Propyl Glycol (LUBRICANT EYE DROPS) 0.4-0.3 % SOLN Place 1-2 drops into both eyes 3 (three) times daily as needed (for dry eyes.).    [provider]  warfarin (COUMADIN) 5 MG tablet Take 1 tablet daily except take 1/2 tablet on Monday and Friday or TAKE AS DIRECTED BY ANTICOAGULATION CLINIC  90 day 10/11/18   Tower, Wynelle Fanny, MD     Allergies Amiodarone hcl, Penicillins, and Statins   Family History  Problem Relation Age of Onset  . Lung cancer Father        smoker  . Alcohol abuse Father   . Cancer Father        bladder and lung CA smoker  . Breast cancer Neg Hx     Social History Social History   Tobacco Use  . Smoking status: Never Smoker  . Smokeless tobacco: Never Used  Substance Use Topics  . Alcohol use: Yes    Alcohol/week: 0.0 standard drinks    Comment: seldom  . Drug use: No    Review of Systems  Constitutional:   No fever or chills.  ENT:   No sore throat. No rhinorrhea. Cardiovascular:   No chest pain or syncope. Respiratory:   No dyspnea or cough. Gastrointestinal:   Negative for abdominal pain, vomiting and diarrhea.  Musculoskeletal:   Negative for focal pain or swelling All other systems reviewed and are negative except as documented above in ROS and  HPI.  ____________________________________________   PHYSICAL EXAM:  VITAL SIGNS: ED Triage Vitals  Enc Vitals Group     BP 11/21/18 1119 114/78     Pulse Rate 11/21/18 1119 81     Resp 11/21/18 1119 20     Temp 11/21/18 1119 98.4  F (36.9 C)     Temp Source 11/21/18 1119 Oral     SpO2 11/21/18 1119 96 %     Weight 11/21/18 1120 180 lb (81.6 kg)     Height 11/21/18 1120 5\' 5"  (1.651 m)     Head Circumference --      Peak Flow --      Pain Score 11/21/18 1120 0     Pain Loc --      Pain Edu? --      Excl. in [place]? --     Vital signs reviewed, nursing assessments reviewed.   Constitutional:   Alert and oriented. Non-toxic appearance. Eyes:   Conjunctivae are normal. EOMI. PERRL.  No APD, no nystagmus ENT      Head:   Normocephalic and atraumatic.  External canals and TMs normal      Nose:   No congestion/rhinnorhea.       Mouth/Throat:   MMM, no pharyngeal erythema. No peritonsillar mass.       Neck:   No meningismus. Full ROM. Hematological/Lymphatic/Immunilogical:   No cervical lymphadenopathy. Cardiovascular:   Irregularly irregular rhythm, rate of 55, A. fib on the monitor. Symmetric bilateral radial and DP pulses.  No murmurs. Cap refill less than 2 seconds. Respiratory:   Normal respiratory effort without tachypnea/retractions. Breath sounds are clear and equal bilaterally. No wheezes/rales/rhonchi. Gastrointestinal:   Soft and nontender. Non distended. There is no CVA tenderness.  No rebound, rigidity, or guarding. Musculoskeletal:   Normal range of motion in all extremities. No joint effusions.  No lower extremity tenderness.  No edema. Neurologic:   Normal speech and language.  Cranial nerves III through XII intact Motor grossly intact. Normal cerebellar function No acute focal neurologic deficits are appreciated.  Skin:    Skin is warm, dry and intact. No rash noted.  No petechiae, purpura, or bullae.  ____________________________________________    LABS  (pertinent positives/negatives) (all labs ordered are listed, but only abnormal results are displayed) Labs Reviewed  BASIC METABOLIC PANEL - Abnormal; Notable for the following components:      Result Value   Glucose, Bld 133 (*)    BUN 25 (*)    All other components within normal limits  PROTIME-INR - Abnormal; Notable for the following components:   Prothrombin Time 24.4 (*)    INR 2.2 (*)    All other components within normal limits  CBC WITH DIFFERENTIAL/PLATELET  TROPONIN I (HIGH SENSITIVITY)   ____________________________________________   EKG  Interpreted by me Atrial fibrillation rate of 66, normal axis.  Normal QRS ST segments and T waves.  No ischemic changes  ____________________________________________    RADIOLOGY  Mr Brain Wo Contrast  Result Date: 11/21/2018 CLINICAL DATA:  Acute presentation with tingling of the left side of the face over the last several days. EXAM: MRI HEAD WITHOUT CONTRAST TECHNIQUE: Multiplanar, multiecho pulse sequences of the brain and surrounding structures were obtained without intravenous contrast. COMPARISON:  04/12/2018 FINDINGS: Brain: Diffusion imaging does not show any acute or subacute infarction. There is generalized age related atrophy. No focal abnormality affects the brainstem or cerebellum. Cerebral hemispheres show mild chronic small-vessel change of the white matter for age. There is an old small left occipital cortical infarction. No large vessel territory infarction. No mass lesion hydrocephalus or extra-axial collection. There are scattered foci of hemosiderin deposition within the brain consistent with chronic microhemorrhage. Vascular: Major vessels at the base of the brain show flow. Skull and upper cervical spine: Negative Sinuses/Orbits:  Clear/normal Other: None IMPRESSION: No acute finding. Age related volume loss. Mild chronic small-vessel ischemic changes as seen previously. 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  Scattered punctate foci hemosiderin deposition as seen previously. Electronically Signed   By: Nelson Chimes M.D.   On: 11/21/2018 13:56    ____________________________________________   PROCEDURES Procedures  ____________________________________________  DIFFERENTIAL DIAGNOSIS   Unspecified recurrent facial paresthesia, ischemic stroke.  Doubt intracranial hemorrhage, meningitis, encephalitis, intracranial hypertension, glaucoma, temporal arteritis  CLINICAL IMPRESSION / ASSESSMENT AND PLAN / ED COURSE  Medications ordered in the ED: Medications - No data to display  Pertinent labs & imaging results that were available during my care of the patient were reviewed by me and considered in my medical decision making (see chart for details).  [name] was evaluated in Emergency Department on 11/21/2018 for the symptoms described in the history of present illness. She was evaluated in the context of the global COVID-19 pandemic, which necessitated consideration that the patient might be at risk for infection with the SARS-CoV-2 virus that causes COVID-19. Institutional protocols and algorithms that pertain to the evaluation of patients at risk for COVID-19 are in a state of rapid change based on information released by regulatory bodies including the CDC and federal and state organizations. These policies and algorithms were followed during the patient's care in the ED.   Patient presents with vague intermittent left facial tingling sensation.  Neurologically intact, nontoxic, vital signs unremarkable.  Given her comorbidities and age, she will need an MRI effectively rule out stroke at this point.  If negative she can follow-up with primary care.  Checking labs as well given her anticoagulation.  Clinical Course as of Nov 20 1453  Mon Nov 21, 2018  1343 Labs are all unremarkable, INR is at goal for her warfarin therapy.  Troponin is normal, and given her symptoms have been going on for  several days, last episode ended this morning before 10 AM, no repeat troponin is necessary as her symptoms are noncardiac.  She is in chronic atrial fibrillation.  After MRI she will be ready for disposition pending results   [PS]    Clinical Course User Index [PS] Carrie Mew, MD     ----------------------------------------- 2:59 PM on 11/21/2018 -----------------------------------------  MRI unremarkable, chronic changes but nothing acute.  Stable for discharge home to follow-up with primary care.  ____________________________________________   FINAL CLINICAL IMPRESSION(S) / ED DIAGNOSES    Final diagnoses:  Facial paresthesia     ED Discharge Orders    None      Portions of this note were generated with dragon dictation software. Dictation errors may occur despite best attempts at proofreading.   Carrie Mew, MD 11/21/18 1459

## 2018-11-21 NOTE — Telephone Encounter (Signed)
I will watch for records, thanks 

## 2018-11-22 ENCOUNTER — Telehealth: Payer: Self-pay

## 2018-11-22 NOTE — Telephone Encounter (Signed)
Patient is on my TCM list as being discharged from ER for facial paresthesia.  She was to set up a follow up with Dr. Glori Bickers but hasn't scheduled yet.  Can you reach out to her in the next day or so and get her scheduled?     Thanks.

## 2018-11-23 NOTE — Telephone Encounter (Signed)
I left a detailed message on patient's voice mail to call back and schedule appointment. °

## 2018-11-24 ENCOUNTER — Ambulatory Visit: Payer: Medicare Other | Admitting: Family Medicine

## 2018-11-24 DIAGNOSIS — Z0000 Encounter for general adult medical examination without abnormal findings: Secondary | ICD-10-CM | POA: Insufficient documentation

## 2018-11-24 NOTE — Progress Notes (Deleted)
   Subjective:    Patient ID: [name], female    DOB: 1933/01/21, 85 y.o.   MRN: [id number]  HPI  Here for ED f/u   . Pt was seen on 11/21/18 for facial paresthesia  She presented with L sided facial tingling for 3 d  No other neuro symptoms   Vitals were nl and stable  Grossly nl neuro exam   Lab Results  Component Value Date   CREATININE 0.80 11/21/2018   BUN 25 (H) 11/21/2018   NA 142 11/21/2018   K 4.0 11/21/2018   CL 105 11/21/2018   CO2 28 11/21/2018   Lab Results  Component Value Date   ALT 9 09/08/2018   AST 14 09/08/2018   ALKPHOS 44 09/08/2018   BILITOT 0.6 09/08/2018   Glucose non fasting 133 Lab Results  Component Value Date   INR 2.2 (H) 11/21/2018   INR 2.4 11/17/2018   INR 2.5 10/06/2018   Lab Results  Component Value Date   WBC 4.9 11/21/2018   HGB 12.4 11/21/2018   HCT 39.3 11/21/2018   MCV 86.9 11/21/2018   PLT 169 11/21/2018   Lab Results  Component Value Date   CKTOTAL 146 12/17/2012   CKMB 0.9 12/17/2012   TROPONINI <0.03 04/12/2018    EKG showed a fib rate of 66/ no acute changes   Imaging Mr Brain Wo Contrast  Result Date: 11/21/2018 CLINICAL DATA:  Acute presentation with tingling of the left side of the face over the last several days. EXAM: MRI HEAD WITHOUT CONTRAST TECHNIQUE: Multiplanar, multiecho pulse sequences of the brain and surrounding structures were obtained without intravenous contrast. COMPARISON:  04/12/2018 FINDINGS: Brain: Diffusion imaging does not show any acute or subacute infarction. There is generalized age related atrophy. No focal abnormality affects the brainstem or cerebellum. Cerebral hemispheres show mild chronic small-vessel change of the white matter for age. There is an old small left occipital cortical infarction. No large vessel territory infarction. No mass lesion hydrocephalus or extra-axial collection. There are scattered foci of hemosiderin deposition within the brain consistent with chronic  microhemorrhage. Vascular: Major vessels at the base of the brain show flow. Skull and upper cervical spine: Negative Sinuses/Orbits: Clear/normal Other: None IMPRESSION: No acute finding. Age related volume loss. Mild chronic small-vessel ischemic changes as seen previously. 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 Scattered punctate foci hemosiderin deposition as seen previously. Electronically Signed   By: Nelson Chimes M.D.   On: 11/21/2018 13:56      Review of Systems     Objective:   Physical Exam        Assessment & Plan:

## 2018-11-29 ENCOUNTER — Encounter: Payer: Self-pay | Admitting: Family Medicine

## 2018-11-29 ENCOUNTER — Ambulatory Visit (INDEPENDENT_AMBULATORY_CARE_PROVIDER_SITE_OTHER): Payer: Medicare Other | Admitting: Family Medicine

## 2018-11-29 ENCOUNTER — Telehealth: Payer: Self-pay | Admitting: Internal Medicine

## 2018-11-29 ENCOUNTER — Other Ambulatory Visit: Payer: Self-pay

## 2018-11-29 VITALS — BP 110/68 | HR 64 | Temp 98.1°F | Ht 65.0 in | Wt 184.5 lb

## 2018-11-29 DIAGNOSIS — R251 Tremor, unspecified: Secondary | ICD-10-CM | POA: Insufficient documentation

## 2018-11-29 DIAGNOSIS — M8589 Other specified disorders of bone density and structure, multiple sites: Secondary | ICD-10-CM | POA: Diagnosis not present

## 2018-11-29 DIAGNOSIS — D500 Iron deficiency anemia secondary to blood loss (chronic): Secondary | ICD-10-CM | POA: Diagnosis not present

## 2018-11-29 DIAGNOSIS — R202 Paresthesia of skin: Secondary | ICD-10-CM | POA: Insufficient documentation

## 2018-11-29 DIAGNOSIS — E2839 Other primary ovarian failure: Secondary | ICD-10-CM | POA: Diagnosis not present

## 2018-11-29 MED ORDER — LEVOTHYROXINE SODIUM 25 MCG PO TABS: 25.0000 ug | ORAL_TABLET | Freq: Every day | ORAL | 3 refills | Status: DC

## 2018-11-29 MED ORDER — FERROUS SULFATE 325 (65 FE) MG PO TBEC: 325.0000 mg | DELAYED_RELEASE_TABLET | Freq: Every day | ORAL | 3 refills | Status: DC

## 2018-11-29 NOTE — Progress Notes (Signed)
Subjective:    Patient ID: [name], female    DOB: [birth year], 85 y.o.   MRN: [id number]  HPI Here for f/u of ED appt on 8/31 She was seen at [facility]  C/o intermittent L sided facial tingling for 3 days  Her vital signs were re assuring as was exam   Labs: Results for orders placed or performed during the hospital encounter of [id number]  Basic metabolic panel  Result Value Ref Range   Sodium 142 135 - 145 mmol/L   Potassium 4.0 3.5 - 5.1 mmol/L   Chloride 105 98 - 111 mmol/L   CO2 28 22 - 32 mmol/L   Glucose, Bld 133 (H) 70 - 99 mg/dL   BUN 25 (H) 8 - 23 mg/dL   Creatinine, Ser 0.80 0.44 - 1.00 mg/dL   Calcium 9.1 8.9 - 10.3 mg/dL   GFR calc non Af Amer >60 >60 mL/min   GFR calc Af Amer >60 >60 mL/min   Anion gap 9 5 - 15  CBC with Differential  Result Value Ref Range   WBC 4.9 4.0 - 10.5 K/uL   RBC 4.52 3.87 - 5.11 MIL/uL   Hemoglobin 12.4 12.0 - 15.0 g/dL   HCT 39.3 36.0 - 46.0 %   MCV 86.9 80.0 - 100.0 fL   MCH 27.4 26.0 - 34.0 pg   MCHC 31.6 30.0 - 36.0 g/dL   RDW 15.0 11.5 - 15.5 %   Platelets 169 150 - 400 K/uL   nRBC 0.0 0.0 - 0.2 %   Neutrophils Relative % 59 %   Neutro Abs 2.9 1.7 - 7.7 K/uL   Lymphocytes Relative 26 %   Lymphs Abs 1.3 0.7 - 4.0 K/uL   Monocytes Relative 10 %   Monocytes Absolute 0.5 0.1 - 1.0 K/uL   Eosinophils Relative 3 %   Eosinophils Absolute 0.1 0.0 - 0.5 K/uL   Basophils Relative 1 %   Basophils Absolute 0.1 0.0 - 0.1 K/uL   Immature Granulocytes 1 %   Abs Immature Granulocytes 0.03 0.00 - 0.07 K/uL  Protime-INR  Result Value Ref Range   Prothrombin Time 24.4 (H) 11.4 - 15.2 seconds   INR 2.2 (H) 0.8 - 1.2  Troponin I (High Sensitivity)  Result Value Ref Range   Troponin I (High Sensitivity) 5 <18 ng/L   EKG showed a fib with rate of 66 =no ischemic changes  MR brain w/o contrast Mr Brain Wo Contrast  Result Date: 11/21/2018 CLINICAL DATA:  Acute presentation with tingling of the left side of the face over the last  several days. EXAM: MRI HEAD WITHOUT CONTRAST TECHNIQUE: Multiplanar, multiecho pulse sequences of the brain and surrounding structures were obtained without intravenous contrast. COMPARISON:  04/12/2018 FINDINGS: Brain: Diffusion imaging does not show any acute or subacute infarction. There is generalized age related atrophy. No focal abnormality affects the brainstem or cerebellum. Cerebral hemispheres show mild chronic small-vessel change of the white matter for age. There is an old small left occipital cortical infarction. No large vessel territory infarction. No mass lesion hydrocephalus or extra-axial collection. There are scattered foci of hemosiderin deposition within the brain consistent with chronic microhemorrhage. Vascular: Major vessels at the base of the brain show flow. Skull and upper cervical spine: Negative Sinuses/Orbits: Clear/normal Other: None IMPRESSION: No acute finding. Age related volume loss. Mild chronic small-vessel ischemic changes as seen previously. 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 Scattered punctate foci hemosiderin deposition as seen previously. Electronically Signed  By: Nelson Chimes M.D.   On: 11/21/2018 13:56    Her symptoms were resolved by 10 am that day  dexa 2018 On fosamax   She still had some tingling L side of her face around eye and cheek and to jaw  Has had it one time before  Comes and goes   Frustrated with isolation due to the pandemic-this bothers her   Had a cramp in toes of L foot yesterday   Has tremor in L hand for years - getting a little worse  Saw a neurologist in [place]  Did not do anything about it      Review of Systems  Constitutional: Negative for activity change, appetite change, fatigue, fever and unexpected weight change.  HENT: Negative for congestion, ear pain, rhinorrhea, sinus pressure and sore throat.   Eyes: Negative for pain, redness and visual disturbance.  Respiratory: Negative for cough, shortness of breath  and wheezing.   Cardiovascular: Negative for chest pain and palpitations.       Irreg HR No palpitations lately  Gastrointestinal: Negative for abdominal pain, blood in stool, constipation and diarrhea.  Endocrine: Negative for polydipsia and polyuria.  Genitourinary: Negative for dysuria, frequency and urgency.  Musculoskeletal: Negative for arthralgias, back pain and myalgias.       Cramps in L toes yesterday Not resolved   Skin: Negative for pallor and rash.  Allergic/Immunologic: Negative for environmental allergies.  Neurological: Positive for tremors. Negative for dizziness, seizures, syncope, facial asymmetry, speech difficulty, light-headedness and headaches.       L face tingling   L hand tremor  Hematological: Negative for adenopathy. Does not bruise/bleed easily.  Psychiatric/Behavioral: Negative for decreased concentration and dysphoric mood. The patient is not nervous/anxious.        Objective:   Physical Exam Constitutional:      General: She is not in acute distress.    Appearance: She is well-developed. She is obese. She is not ill-appearing or diaphoretic.  HENT:     Head: Normocephalic and atraumatic.     Comments: No facial swelling or droop No sinus or temporal tenderness    Right Ear: External ear normal.     Left Ear: External ear normal.     Nose: Nose normal.     Mouth/Throat:     Mouth: Mucous membranes are moist.     Pharynx: Oropharynx is clear. No oropharyngeal exudate.  Eyes:     General: No scleral icterus.       Right eye: No discharge.        Left eye: No discharge.     Conjunctiva/sclera: Conjunctivae normal.     Pupils: Pupils are equal, round, and reactive to light.     Comments: No nystagmus  Neck:     Musculoskeletal: Full passive range of motion without pain, normal range of motion and neck supple. No neck rigidity or muscular tenderness.     Thyroid: No thyromegaly.     Vascular: No carotid bruit or JVD.     Trachea: No tracheal  deviation.  Cardiovascular:     Rate and Rhythm: Normal rate. Rhythm irregular.     Heart sounds: Normal heart sounds. No murmur.  Pulmonary:     Effort: Pulmonary effort is normal. No respiratory distress.     Breath sounds: Normal breath sounds. No wheezing or rales.     Comments: Good air exch Abdominal:     General: Bowel sounds are normal. There is no distension.  Palpations: Abdomen is soft. There is no mass.     Tenderness: There is no abdominal tenderness.  Musculoskeletal:        General: No tenderness.  Lymphadenopathy:     Cervical: No cervical adenopathy.  Skin:    General: Skin is warm and dry.     Coloration: Skin is not pale.     Findings: No erythema or rash.  Neurological:     Mental Status: She is alert and oriented to person, place, and time.     Cranial Nerves: No cranial nerve deficit, dysarthria or facial asymmetry.     Sensory: Sensation is intact. No sensory deficit.     Motor: No weakness, tremor, atrophy, abnormal muscle tone or pronator drift.     Coordination: Coordination is intact. Romberg sign negative. Coordination normal.     Gait: Gait is intact. Gait normal.     Deep Tendon Reflexes: Reflexes are normal and symmetric.     Comments: No focal cerebellar signs   Pt c/o very mild tingling of L face - no change in sens to lt touch or temp today     Psychiatric:        Behavior: Behavior normal.        Thought Content: Thought content normal.           Assessment & Plan:   Problem List Items Addressed This Visit      Musculoskeletal and Integument   Osteopenia    Due for 2 y dexa Taking fosamax No falls or fractures Supplements Vit D Enc exercise as tolerated        Other   Estrogen deficiency   Relevant Orders   DG Bone Density   Iron deficiency anemia    Hematology follows Refilled her iron for mail in px      Relevant Medications   ferrous sulfate 325 (65 FE) MG EC tablet   Facial tingling    Recurrent L facial  tingling w/o pain /swelling or other symptoms Reviewed hospital records, lab results and studies in detail  -reassuring MR and w/u  This is bothersome to her  Exam wnl  Will ref to neurology for further eval      Relevant Orders   Ambulatory referral to Neurology   Tremor of left hand - Primary    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

## 2018-11-29 NOTE — Patient Instructions (Addendum)
Take care of yourself  Stop at check out for referral for bone density test   The office will call you about the neurology referral   If symptoms change-let me know in the meantime

## 2018-11-29 NOTE — Assessment & Plan Note (Signed)
Due for 2 y dexa Taking fosamax No falls or fractures Supplements Vit D Enc exercise as tolerated

## 2018-11-29 NOTE — Assessment & Plan Note (Signed)
Hematology follows Refilled her iron for mail in px

## 2018-11-29 NOTE — Assessment & Plan Note (Signed)
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

## 2018-11-29 NOTE — Telephone Encounter (Signed)
New Message     Pt has questions about the monitor. She says she has received a request for her insurance information and has questions    Please call

## 2018-11-29 NOTE — Assessment & Plan Note (Signed)
Recurrent L facial tingling w/o pain /swelling or other symptoms Reviewed hospital records, lab results and studies in detail  -reassuring MR and w/u  This is bothersome to her  Exam wnl  Will ref to neurology for further eval

## 2018-12-20 DIAGNOSIS — L821 Other seborrheic keratosis: Secondary | ICD-10-CM | POA: Diagnosis not present

## 2018-12-20 DIAGNOSIS — Z86007 Personal history of in-situ neoplasm of skin: Secondary | ICD-10-CM | POA: Diagnosis not present

## 2018-12-20 DIAGNOSIS — D485 Neoplasm of uncertain behavior of skin: Secondary | ICD-10-CM | POA: Diagnosis not present

## 2018-12-20 DIAGNOSIS — L820 Inflamed seborrheic keratosis: Secondary | ICD-10-CM | POA: Diagnosis not present

## 2018-12-26 ENCOUNTER — Other Ambulatory Visit: Payer: Self-pay

## 2018-12-26 ENCOUNTER — Inpatient Hospital Stay: Payer: Medicare Other | Attending: Oncology

## 2018-12-26 DIAGNOSIS — R5382 Chronic fatigue, unspecified: Secondary | ICD-10-CM | POA: Insufficient documentation

## 2018-12-26 DIAGNOSIS — E039 Hypothyroidism, unspecified: Secondary | ICD-10-CM | POA: Insufficient documentation

## 2018-12-26 DIAGNOSIS — D500 Iron deficiency anemia secondary to blood loss (chronic): Secondary | ICD-10-CM

## 2018-12-26 DIAGNOSIS — I4891 Unspecified atrial fibrillation: Secondary | ICD-10-CM | POA: Insufficient documentation

## 2018-12-26 DIAGNOSIS — Z7901 Long term (current) use of anticoagulants: Secondary | ICD-10-CM | POA: Insufficient documentation

## 2018-12-28 ENCOUNTER — Encounter: Payer: Self-pay | Admitting: Oncology

## 2018-12-28 ENCOUNTER — Inpatient Hospital Stay: Payer: Medicare Other | Admitting: Oncology

## 2018-12-28 ENCOUNTER — Inpatient Hospital Stay (HOSPITAL_BASED_OUTPATIENT_CLINIC_OR_DEPARTMENT_OTHER): Payer: Medicare Other | Admitting: Oncology

## 2018-12-28 ENCOUNTER — Inpatient Hospital Stay: Payer: Medicare Other

## 2018-12-28 ENCOUNTER — Other Ambulatory Visit: Payer: Self-pay

## 2018-12-28 VITALS — BP 102/67 | HR 84 | Temp 97.2°F | Resp 18 | Wt 186.4 lb

## 2018-12-28 DIAGNOSIS — R5383 Other fatigue: Secondary | ICD-10-CM

## 2018-12-28 DIAGNOSIS — Z7901 Long term (current) use of anticoagulants: Secondary | ICD-10-CM | POA: Diagnosis not present

## 2018-12-28 DIAGNOSIS — D500 Iron deficiency anemia secondary to blood loss (chronic): Secondary | ICD-10-CM

## 2018-12-28 DIAGNOSIS — E2839 Other primary ovarian failure: Secondary | ICD-10-CM

## 2018-12-28 DIAGNOSIS — E039 Hypothyroidism, unspecified: Secondary | ICD-10-CM | POA: Diagnosis not present

## 2018-12-28 DIAGNOSIS — R5382 Chronic fatigue, unspecified: Secondary | ICD-10-CM | POA: Diagnosis not present

## 2018-12-28 DIAGNOSIS — I4891 Unspecified atrial fibrillation: Secondary | ICD-10-CM | POA: Diagnosis not present

## 2018-12-28 LAB — CBC WITH DIFFERENTIAL/PLATELET
Abs Immature Granulocytes: 0.01 10*3/uL (ref 0.00–0.07)
Basophils Absolute: 0.1 10*3/uL (ref 0.0–0.1)
Basophils Relative: 1 %
Eosinophils Absolute: 0.2 10*3/uL (ref 0.0–0.5)
Eosinophils Relative: 3 %
HCT: 37.8 % (ref 36.0–46.0)
Hemoglobin: 12.1 g/dL (ref 12.0–15.0)
Immature Granulocytes: 0 %
Lymphocytes Relative: 28 %
Lymphs Abs: 1.6 10*3/uL (ref 0.7–4.0)
MCH: 27.8 pg (ref 26.0–34.0)
MCHC: 32 g/dL (ref 30.0–36.0)
MCV: 86.7 fL (ref 80.0–100.0)
Monocytes Absolute: 0.5 10*3/uL (ref 0.1–1.0)
Monocytes Relative: 8 %
Neutro Abs: 3.5 10*3/uL (ref 1.7–7.7)
Neutrophils Relative %: 60 %
Platelets: 176 10*3/uL (ref 150–400)
RBC: 4.36 MIL/uL (ref 3.87–5.11)
RDW: 14.8 % (ref 11.5–15.5)
WBC: 5.9 10*3/uL (ref 4.0–10.5)
nRBC: 0 % (ref 0.0–0.2)

## 2018-12-28 LAB — IRON AND TIBC
Iron: 62 ug/dL (ref 28–170)
Saturation Ratios: 18 % (ref 10.4–31.8)
TIBC: 350 ug/dL (ref 250–450)
UIBC: 288 ug/dL

## 2018-12-28 LAB — FERRITIN: Ferritin: 81 ng/mL (ref 11–307)

## 2018-12-28 NOTE — Progress Notes (Deleted)
Please see below. Follow up plan lab md 6 months.   Ms.Hizer,  You blood work results are good.  Normal blood level and iron level.  I recommend you to continue taking oral iron 1 tab a day.  You will be contacted to set up your follow upi 6 months with blood work. Thank you

## 2018-12-28 NOTE — Progress Notes (Signed)
Hematology/Oncology Consult note [facility] [phone] [phone]   Patient Care Team: Tower, Wynelle Fanny, MD as PCP - General  REFERRING PROVIDER: Dr.Anna CHIEF COMPLAINTS/REASON FOR VISIT:  Follow up iron deficiency anemia.   HISTORY OF PRESENTING ILLNESS:  [name] is a  85 y.o.  female with PMH listed below who was referred to me for evaluation of iron deficiency anemia.  Patient reports having worsening of fatigue since her hip surgery on 05/04/2017.  Chronic A fib, on  coumadin.  09/20/2017 She presented to ER for rectal bleeding.  Lab work up showed hemoglobin 11.3, with MCV 77,  10/13/2017 Colonoscopy showed multiple polyps in the transverse colon and in the ascending colon, removed.  Submucosal nodule in distal rectum.  Upper endoscopy: normal esophagus, hiatal hernia, otherwise normal.  Pathology showed tubular adenoma. Negative for malignancy.  Duodenum biopsy negative for intraepithelial lymphocytosis, dysplasia and malignancy.   Further work up includes iron panel which was obtain on 10/14/2017. Consistent with iron deficiency anemia.  History of iron deficiency: denies Rectal bleeding: yes Menstrual bleeding/ Vaginal bleeding : post menopausal Hematemesis or hemoptysis : denies Blood in urine : denies  Pica: denies Fatigue: Yes.  SOB: deneis  10/13/2017 colonoscopy showed 12 polyps which were resected and retrieved.  Negative for high-grade dysplasia and malignancy.  Upper endoscopy showed a 4 cm hiatal hernia.  Examination was otherwise normal. Capsule study 12/10/2017 showed a few non bleeding AVM.    INTERVAL HISTORY [name] is a 85 y.o. female who has above history reviewed by me today presents for follow up visit for management of iron deficiency anemia.  Previously received IV Venofer. She no showed to her lab encounter on 12/26/2018.   Continues to feel tired. She participates in exercises on regular basis. Just  feels sometimes she has to lie down and rest.  Hypothyroidism, follows up with PCP. Most recent TSH 3.74.  She is on chronic anticoagulation with coumadin  Denies seeing any blood in the stool or black tarry stool.  Appetite is good.  Weight has been stable  Review of Systems  Constitutional: Positive for malaise/fatigue. Negative for chills, fever and weight loss.  HENT: Negative for nosebleeds and sore throat.   Eyes: Negative for double vision, photophobia and redness.  Respiratory: Negative for cough, shortness of breath and wheezing.   Cardiovascular: Negative for chest pain, palpitations, orthopnea and leg swelling.  Gastrointestinal: Negative for abdominal pain, blood in stool, nausea and vomiting.  Genitourinary: Negative for dysuria.  Musculoskeletal: Negative for back pain, myalgias and neck pain.  Skin: Negative for itching and rash.  Neurological: Negative for dizziness, tingling and tremors.  Endo/Heme/Allergies: Negative for environmental allergies. Does not bruise/bleed easily.  Psychiatric/Behavioral: Negative for depression and hallucinations.    MEDICAL HISTORY:  Past Medical History:  Diagnosis Date  . Allergic rhinitis   . 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 Arthritis   . Atrial fibrillation -persistent cardiologist-  dr klein/  primary EP -- dr Tawanna Sat ([facility])   a. s/p PVI [name] 2010;  b. on tikosyn/coumadin;  c. 05/2009 Echo: EF 60-65%, Gr 2 DD. (first dx 09/ 2007)  . Bilateral lower extremity edema   . Bleeding hemorrhoid   . Carotid stenosis    mild (hosp 3/11)- consult by vasc/ Dr Donnetta Hutching  . Complication of anesthesia    hard to wake  . Diverticulosis of colon   . Dyspnea    on exertion-climbing stairs  . Dysrhythmia   .  Fatty liver   . H/O cardiac radiofrequency ablation    01/ 2008 at [facility] /  03/ 2010  at [facility]  . Heart failure with preserved ejection fraction ([place])   . Heart murmur    "prior to valve repair"  . History of  adenomatous polyp of colon    tubular adenoma's  . History of cardiomyopathy    secondary tachycardia-induced cardiomyopathy -- resolved 2014  . History of squamous cell carcinoma in situ (SCCIS) of skin    05/ 2017  nasal bridge and right medial knee  . History of transient ischemic attack (TIA)    01-24-2005 and 06-12-2009  . Hyperlipidemia   . Hypothyroidism   . Mild intermittent asthma    reacts to cats  . Mixed stress and urge urinary incontinence   . Pulmonary nodule   . S/P mitral valve repair 10-23-1998  dr Boyce Medici at [facility]   for MVP and regurg. (annuloplasty ring procedure)  . Swelling of left extremity 2017   states it's gotten worse  . Varicose vein of leg   . Wears glasses     SURGICAL HISTORY: Past Surgical History:  Procedure Laterality Date  . APPENDECTOMY  1978  . CARDIAC ELECTROPHYSIOLOGY [place] AND ABLATION  01/ 2008    at [facility]   right-sided ablation atrial flutter  . CARDIAC ELECTROPHYSIOLOGY STUDY AND ABLATION  03/ 2010   dr Jaymes Graff at [facility]   AV node ablation and pulmonary vein isolation for atrial fib  . CARDIOVERSION  06-18-2006;  07-13-2006;  10-19-2010;  10-27-2010  . COLONOSCOPY    . COLONOSCOPY WITH PROPOFOL N/A 10/13/2017   Procedure: COLONOSCOPY WITH PROPOFOL;  Surgeon: [name], MD;  Location: [facility] ENDOSCOPY;  Service: Gastroenterology;  Laterality: N/A;  . CYSTO/ TRANSURETHRAL COLLAGEN INJECTION THERAPY  07-26-2007   dr Matilde Sprang  . DILATION AND CURETTAGE OF UTERUS    . ESOPHAGOGASTRODUODENOSCOPY (EGD) WITH PROPOFOL N/A 10/13/2017   Procedure: ESOPHAGOGASTRODUODENOSCOPY (EGD) WITH PROPOFOL;  Surgeon: [name], MD;  Location: [facility] ENDOSCOPY;  Service: Gastroenterology;  Laterality: N/A;  . EXCISIONAL HEMORRHOIDECTOMY  1980s  . GIVENS CAPSULE STUDY N/A 12/08/2017   Procedure: GIVENS CAPSULE STUDY;  Surgeon: [name], MD;  Location: [facility] ENDOSCOPY;  Service: Gastroenterology;  Laterality: N/A;  . HEMORRHOID  SURGERY N/A 10/29/2016   Procedure: HEMORRHOIDECTOMY;  Surgeon: Leighton Ruff, MD;  Location: [facility];  Service: General;  Laterality: N/A;  . MITRAL VALVE ANNULOPLASTY  10/23/1998   "Model [id number]; Campbell Lerner [id number]"; size 25mm; [facility]; Dr. Boyce Medici  . PILONIDAL CYST EXCISION  1954  . TEE WITH CARDIOVERSION  05-06-2006 at [facility];  01-02-2013 at [facility]  . TOTAL HIP ARTHROPLASTY Left 05/04/2017   Procedure: LEFT TOTAL HIP ARTHROPLASTY ANTERIOR APPROACH;  Surgeon: Mcarthur Rossetti, MD;  Location: [place];  Service: Orthopedics;  Laterality: Left;  . TRANSTHORACIC ECHOCARDIOGRAM  05-01-2015   dr Caryl Comes   ef 50-55%/  mild AV sclerosis without stenosis/  post MV repair with mild central MR (valve area by pressure half-time 2cm^2,  valve area by continutity equation 0.91cm^2, peak grandiant 33mmHg)/  severe LAE/ mild TR/ mild RAE   . TUBAL LIGATION Bilateral 1978    SOCIAL HISTORY: Social History   Socioeconomic History  . Marital status: Widowed    Spouse name: Not on file  . Number of children: 6  . Years of education: Not on file  . Highest education level: Not on file  Occupational History  . Occupation: Cabin crew  Employer: RETIRED  Social Needs  . Financial resource strain: Not on file  . Food insecurity    Worry: Not on file    Inability: Not on file  . Transportation needs    Medical: Not on file    Non-medical: Not on file  Tobacco Use  . Smoking status: Never Smoker  . Smokeless tobacco: Never Used  Substance and Sexual Activity  . Alcohol use: Yes    Alcohol/week: 0.0 standard drinks    Comment: seldom  . Drug use: No  . Sexual activity: Not Currently  Lifestyle  . Physical activity    Days per week: Not on file    Minutes per session: Not on file  . Stress: Not on file  Relationships  . Social Herbalist on phone: Not on file    Gets together: Not on file    Attends religious service: Not on file    Active member of club or  organization: Not on file    Attends meetings of clubs or organizations: Not on file    Relationship status: Not on file  . Intimate partner violence    Fear of current or ex partner: Not on file    Emotionally abused: Not on file    Physically abused: Not on file    Forced sexual activity: Not on file  Other Topics Concern  . Not on file  Social History Narrative   Retired. Daily Caffeine use: 2 daily     FAMILY HISTORY: Family History  Problem Relation Age of Onset  . Lung cancer Father        smoker  . Alcohol abuse Father   . Cancer Father        bladder and lung CA smoker  . Breast cancer Neg Hx     ALLERGIES:  is allergic to amiodarone hcl; penicillins; and statins.  MEDICATIONS:  Current Outpatient Medications  Medication Sig Dispense Refill  . albuterol (PROVENTIL HFA;VENTOLIN HFA) 108 (90 Base) MCG/ACT inhaler Inhale 2 puffs into the lungs every 4 (four) hours as needed for wheezing or shortness of breath. 1 Inhaler 0  . alendronate (FOSAMAX) 70 MG tablet Take 1 tablet (70 mg total) by mouth every 7 (seven) days. Take with a full glass of water on an empty stomach. 12 tablet 1  . Cholecalciferol (VITAMIN D3) 2000 units TABS Take 4,000 Units by mouth daily.    . clindamycin (CLEOCIN) 150 MG capsule TAKE 4 CAPSULES BY MOUTH 1 HR PRIOR TO DENTAL APPT  0  . diltiazem (CARDIZEM CD) 120 MG 24 hr capsule Take 1 capsule (120 mg total) by mouth 2 (two) times daily. 180 capsule 3  . diphenhydrAMINE (BENADRYL) 25 mg capsule Take 50 mg by mouth every 6 (six) hours as needed for itching.    . ferrous sulfate 325 (65 FE) MG EC tablet Take 1 tablet (325 mg total) by mouth daily with breakfast. 90 tablet 3  . fluticasone (FLONASE) 50 MCG/ACT nasal spray Place 1 spray into both nostrils daily as needed for allergies.    . furosemide (LASIX) 40 MG tablet TAKE 1/2 TABLET BY MOUTH DAILY, MAY INCREASE TO 1 TABLET EVERY DAY IF NEEDED 90 tablet 1  . levothyroxine (SYNTHROID) 25 MCG tablet  Take 1 tablet (25 mcg total) by mouth daily before breakfast. 90 tablet 3  . linaclotide (LINZESS) 72 MCG capsule Take 1 capsule (72 mcg total) by mouth daily before breakfast. 30 capsule 3  . loratadine (CLARITIN)  10 MG tablet Take 10 mg by mouth daily as needed for allergies.     . metoprolol tartrate (LOPRESSOR) 25 MG tablet Take 1 tablet (25 mg total) by mouth 2 (two) times daily as needed. For fast heart rate 180 tablet 3  . Polyethyl Glycol-Propyl Glycol (LUBRICANT EYE DROPS) 0.4-0.3 % SOLN Place 1-2 drops into both eyes 3 (three) times daily as needed (for dry eyes.).    [name] warfarin (COUMADIN) 5 MG tablet Take 1 tablet daily except take 1/2 tablet on Monday and Friday or TAKE AS DIRECTED BY ANTICOAGULATION CLINIC  90 day 90 tablet 1   No current facility-administered medications for this visit.      PHYSICAL EXAMINATION: ECOG PERFORMANCE STATUS: 1 - Symptomatic but completely ambulatory Vitals:   12/28/18 1304  BP: 102/67  Pulse: 84  Resp: 18  Temp: (!) 97.2 F (36.2 C)   Filed Weights   12/28/18 1304  Weight: 186 lb 6.4 oz (84.6 kg)    Physical Exam Constitutional:      General: She is not in acute distress. HENT:     Head: Normocephalic and atraumatic.  Eyes:     General: No scleral icterus.    Pupils: Pupils are equal, round, and reactive to light.  Neck:     Musculoskeletal: Normal range of motion and neck supple.  Cardiovascular:     Rate and Rhythm: Normal rate and regular rhythm.     Heart sounds: Normal heart sounds.  Pulmonary:     Effort: Pulmonary effort is normal. No respiratory distress.     Breath sounds: Normal breath sounds. No wheezing.  Abdominal:     General: Bowel sounds are normal. There is no distension.     Palpations: Abdomen is soft. There is no mass.     Tenderness: There is no abdominal tenderness.  Musculoskeletal: Normal range of motion.        General: No deformity.  Skin:    General: Skin is warm and dry.     Findings: No erythema  or rash.  Neurological:     Mental Status: She is alert and oriented to person, place, and time.     Cranial Nerves: No cranial nerve deficit.     Coordination: Coordination normal.  Psychiatric:        Behavior: Behavior normal.        Thought Content: Thought content normal.      LABORATORY DATA:  I have reviewed the data as listed Lab Results  Component Value Date   WBC 5.9 12/28/2018   HGB 12.1 12/28/2018   HCT 37.8 12/28/2018   MCV 86.7 12/28/2018   PLT 176 12/28/2018   Recent Labs    02/04/18 1701 04/12/18 1540 09/08/18 0859 11/21/18 1124  NA 141 141 142 142  K 4.5 3.7 4.5 4.0  CL 103 104 105 105  CO2 28 29 30 28   GLUCOSE 85 91 89 133*  BUN 23 20 16  25*  CREATININE 0.87 0.83 0.84 0.80  CALCIUM 9.2 9.1 9.1 9.1  GFRNONAA  --  >60  --  >60  GFRAA  --  >60  --  >60  PROT 7.3  --  6.9  --   ALBUMIN  --   --  4.0  --   AST 17  --  14  --   ALT 11  --  9  --   ALKPHOS  --   --  44  --   BILITOT 0.5  --  0.6  --    Iron/TIBC/Ferritin/ %Sat    Component Value Date/Time   IRON 62 12/28/2018 1338   TIBC 350 12/28/2018 1338   FERRITIN 81 12/28/2018 1338   IRONPCTSAT 18 12/28/2018 1338        ASSESSMENT & PLAN:  1. Iron deficiency anemia due to chronic blood loss   2. Chronic anticoagulation   3. Other fatigue    No labs prior to this visit was done.  Will arrange patient to get blood work done today.  Check cbc, iron, TIBC, ferritin,   Labs were reviewed.  Hemoglobin is normal and stable.  Iron panel showed ferritin 81, iron saturation of 18.  No need for IV venofer.  Recommend patient to continue ferrous sulfate 325mg  daily.   Fatigue, unclear etiology. Discussed with patient that fatigue is less likely associated with anemia or iron deficiency.   RTC CBC iron ferritin in 6 months and follow-up in clinic.  No orders of the defined types were placed in this encounter.   All questions were answered. The patient knows to call the clinic with any  problems questions or concerns.  Return of visit: 6 months  Earlie Server, MD, PhD Hematology Oncology [facility] Pager- [phone] 12/28/2018

## 2018-12-28 NOTE — Progress Notes (Signed)
Patient reports chronic fatigue.

## 2018-12-29 ENCOUNTER — Ambulatory Visit (INDEPENDENT_AMBULATORY_CARE_PROVIDER_SITE_OTHER): Payer: Medicare Other | Admitting: General Practice

## 2018-12-29 ENCOUNTER — Telehealth: Payer: Self-pay

## 2018-12-29 DIAGNOSIS — Z7901 Long term (current) use of anticoagulants: Secondary | ICD-10-CM | POA: Diagnosis not present

## 2018-12-29 LAB — POCT INR: INR: 2.5 (ref 2.0–3.0)

## 2018-12-29 NOTE — Patient Instructions (Signed)
Pre visit review using our clinic review tool, if applicable. No additional management support is needed unless otherwise documented below in the visit note.  Continue to take 1 pill (5mg) daily EXCEPT for 1/2 pill (2.5mg) on Mondays and Fridays.   Re-check in 6 weeks.    

## 2018-12-29 NOTE — Telephone Encounter (Signed)
-----   Message from Earlie Server, MD sent at 12/28/2018  9:47 PM EDT ----- Please see below. Follow up plan lab md 6 months.   [name],  You blood work results are good.  Normal blood level and iron level.  I recommend you to continue taking oral iron 1 tab a day.  You will be contacted to set up your follow upi 6 months with blood work. Thank you

## 2018-12-29 NOTE — Telephone Encounter (Signed)
Lab/MD in 6 months   Pt has been scheduled.Marland KitchenMarland KitchenMarland Kitchen

## 2018-12-29 NOTE — Telephone Encounter (Signed)
Patient informed of results.  

## 2019-01-13 ENCOUNTER — Ambulatory Visit (INDEPENDENT_AMBULATORY_CARE_PROVIDER_SITE_OTHER): Payer: Medicare Other | Admitting: Internal Medicine

## 2019-01-13 ENCOUNTER — Encounter: Payer: Self-pay | Admitting: Internal Medicine

## 2019-01-13 ENCOUNTER — Other Ambulatory Visit: Payer: Self-pay

## 2019-01-13 VITALS — BP 122/62 | HR 88 | Ht 65.0 in | Wt 185.6 lb

## 2019-01-13 DIAGNOSIS — I10 Essential (primary) hypertension: Secondary | ICD-10-CM

## 2019-01-13 DIAGNOSIS — I4821 Permanent atrial fibrillation: Secondary | ICD-10-CM

## 2019-01-13 DIAGNOSIS — I509 Heart failure, unspecified: Secondary | ICD-10-CM | POA: Diagnosis not present

## 2019-01-13 MED ORDER — FUROSEMIDE 40 MG PO TABS: 40.0000 mg | ORAL_TABLET | Freq: Every day | ORAL | 3 refills | Status: DC

## 2019-01-13 MED ORDER — DILTIAZEM HCL ER COATED BEADS 120 MG PO CP24: 120.0000 mg | ORAL_CAPSULE | Freq: Every day | ORAL | 3 refills | Status: DC

## 2019-01-13 NOTE — Patient Instructions (Signed)
Medication Instructions:  Your physician has recommended you make the following change in your medication:   1. Decrease your Diltiazem to 120mg  capsule, once daily 2. Begin taking your lasix, 40mg  tablet, once daily on the days your are at home.    Labwork: None ordered.  Testing/Procedures: Your physician has requested that you have an exercise tolerance test. For further information please visit [email or web address]. Please also follow instruction sheet, as given.   Follow-Up: Your physician recommends that you schedule a follow-up appointment in:   Dr. Caryl Comes will determine your follow up appointment after your stress test.   Any Other Special Instructions Will Be Listed Below (If Applicable).     If you need a refill on your cardiac medications before your next appointment, please call your pharmacy.

## 2019-01-13 NOTE — Progress Notes (Signed)
Electrophysiology Office Note   Date:  01/13/2019   ID:  [name], Key 1932-03-31, MRN [id number]  PCP:  Abner Greenspan, MD  Cardiologist:   Primary Electrophysiologist:  Virl Axe, MD    No chief complaint on file.    History of Present Illness: [name] is a 85 y.o. female seen in followup electrophysiology evaluation for atrial fibrillation.    She has hx of atrial flutter ablation related to prior atriotomy undertaken at [facility] in March [id number] complicated by recurrent atrial arrhythmias prompting initiation of Tikosyn  This was stopped fall 2012 Recurrent symptoms prompted [name] to reinitiate Tikosyn.  Shes had recurrent episodes of atrial fibrillation associated with a rapid ventricular response.   She underwent cardioversion and reverted again to atrial fibrillation; it is now permanent She has decided to remain on warfarin  Increased HR in afib >> dilt increased from 180 qd >>120 bid and prn BB (stopped in the long-term because of alopecia)   She has undergone a lot of social change recently.  She has moved.  Her daughter-in-law died suddenly about 06-27-22.  Devastating. Her son has moved in with her over the last few weeks.  Exercise tolerance remains exceedingly poor.  She is intermittently able to walk to her mailbox at 400 feet.  She often has to stop about halfway.  She has a treadmill at home which she has not been using since she moved.  Some edema.  No nocturnal dyspnea or orthopnea.  Zio monitor demonstrated a mean heart rate during waking hours over 150 for the first 2 days and then at about 75 a second days this presumably coincidental with the initiation of metoprolol 25 twice daily.  She noted no improvement in perhaps some worsening of exercise tolerance.  DATE TEST EF   10/14 Echo   55-60 %   2/17 Echo   50-55 %   9/18 Echo  55-65%       Date Cr TSH Hgb  6/18    13.9  2/19 0.74  9.8  1/20 0.83 4.06(11/19) 12.6  6/20 0.84 5.04 12.1    Past  Medical History:  Diagnosis Date  . Allergic rhinitis   . 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 Arthritis   . Atrial fibrillation -persistent cardiologist-  dr Miko Markwood/  primary EP -- dr Tawanna Sat ([facility])   a. s/p PVI [name] 2010;  b. on tikosyn/coumadin;  c. 05/2009 Echo: EF 60-65%, Gr 2 DD. (first dx 09/ 2007)  . Bilateral lower extremity edema   . Bleeding hemorrhoid   . Carotid stenosis    mild (hosp 3/11)- consult by vasc/ Dr Donnetta Hutching  . Complication of anesthesia    hard to wake  . Diverticulosis of colon   . Dyspnea    on exertion-climbing stairs  . Dysrhythmia   . Fatty liver   . H/O cardiac radiofrequency ablation    01/ 2008 at [facility] /  03/ 2010  at [facility]  . Heart failure with preserved ejection fraction ([place])   . Heart murmur    "prior to valve repair"  . History of adenomatous polyp of colon    tubular adenoma's  . History of cardiomyopathy    secondary tachycardia-induced cardiomyopathy -- resolved 2014  . History of squamous cell carcinoma in situ (SCCIS) of skin    05/ 2017  nasal bridge and right medial knee  . History of transient ischemic attack (TIA)    01-24-2005 and 06-12-2009  .  Hyperlipidemia   . Hypothyroidism   . Mild intermittent asthma    reacts to cats  . Mixed stress and urge urinary incontinence   . Pulmonary nodule   . S/P mitral valve repair 10-23-1998  dr Boyce Medici at [facility]   for MVP and regurg. (annuloplasty ring procedure)  . Swelling of left extremity 2017   states it's gotten worse  . Varicose vein of leg   . Wears glasses    Past Surgical History:  Procedure Laterality Date  . APPENDECTOMY  1978  . CARDIAC ELECTROPHYSIOLOGY [place] AND ABLATION  01/ 2008    at [place]   right-sided ablation atrial flutter  . CARDIAC ELECTROPHYSIOLOGY STUDY AND ABLATION  03/ 2010   dr Jaymes Graff at [facility]   AV node ablation and pulmonary vein isolation for atrial fib  . CARDIOVERSION  06-18-2006;  07-13-2006;   10-19-2010;  10-27-2010  . COLONOSCOPY    . COLONOSCOPY WITH PROPOFOL N/A 10/13/2017   Procedure: COLONOSCOPY WITH PROPOFOL;  Surgeon: [name], MD;  Location: [facility] ENDOSCOPY;  Service: Gastroenterology;  Laterality: N/A;  . CYSTO/ TRANSURETHRAL COLLAGEN INJECTION THERAPY  07-26-2007   dr Matilde Sprang  . DILATION AND CURETTAGE OF UTERUS    . ESOPHAGOGASTRODUODENOSCOPY (EGD) WITH PROPOFOL N/A 10/13/2017   Procedure: ESOPHAGOGASTRODUODENOSCOPY (EGD) WITH PROPOFOL;  Surgeon: [name], MD;  Location: [facility] ENDOSCOPY;  Service: Gastroenterology;  Laterality: N/A;  . EXCISIONAL HEMORRHOIDECTOMY  1980s  . GIVENS CAPSULE STUDY N/A 12/08/2017   Procedure: GIVENS CAPSULE STUDY;  Surgeon: [name], MD;  Location: [facility] ENDOSCOPY;  Service: Gastroenterology;  Laterality: N/A;  . HEMORRHOID SURGERY N/A 10/29/2016   Procedure: HEMORRHOIDECTOMY;  Surgeon: Leighton Ruff, MD;  Location: [facility];  Service: General;  Laterality: N/A;  . MITRAL VALVE ANNULOPLASTY  10/23/1998   "Model [id number]; Campbell Lerner [id number]"; size 22mm; [facility]; Dr. Boyce Medici  . PILONIDAL CYST EXCISION  1954  . TEE WITH CARDIOVERSION  05-06-2006 at [facility];  01-02-2013 at [facility]  . TOTAL HIP ARTHROPLASTY Left 05/04/2017   Procedure: LEFT TOTAL HIP ARTHROPLASTY ANTERIOR APPROACH;  Surgeon: Mcarthur Rossetti, MD;  Location: [place];  Service: Orthopedics;  Laterality: Left;  . TRANSTHORACIC ECHOCARDIOGRAM  05-01-2015   dr Caryl Comes   ef 50-55%/  mild AV sclerosis without stenosis/  post MV repair with mild central MR (valve area by pressure half-time 2cm^2,  valve area by continutity equation 0.91cm^2, peak grandiant 6mmHg)/  severe LAE/ mild TR/ mild RAE   . TUBAL LIGATION Bilateral 1978     Current Outpatient Medications  Medication Sig Dispense Refill  . albuterol (PROVENTIL HFA;VENTOLIN HFA) 108 (90 Base) MCG/ACT inhaler Inhale 2 puffs into the lungs every 4 (four) hours as needed for wheezing or shortness of breath. 1  Inhaler 0  . alendronate (FOSAMAX) 70 MG tablet Take 1 tablet (70 mg total) by mouth every 7 (seven) days. Take with a full glass of water on an empty stomach. 12 tablet 1  . Cholecalciferol (VITAMIN D3) 2000 units TABS Take 4,000 Units by mouth daily.    . clindamycin (CLEOCIN) 150 MG capsule TAKE 4 CAPSULES BY MOUTH 1 HR PRIOR TO DENTAL APPT  0  . diphenhydrAMINE (BENADRYL) 25 mg capsule Take 50 mg by mouth every 6 (six) hours as needed for itching.    . ferrous sulfate 325 (65 FE) MG EC tablet Take 1 tablet (325 mg total) by mouth daily with breakfast. 90 tablet 3  . fluticasone (FLONASE) 50 MCG/ACT nasal spray Place 1  spray into both nostrils daily as needed for allergies.    . furosemide (LASIX) 40 MG tablet TAKE 1/2 TABLET BY MOUTH DAILY, MAY INCREASE TO 1 TABLET EVERY DAY IF NEEDED 90 tablet 1  . levothyroxine (SYNTHROID) 25 MCG tablet Take 1 tablet (25 mcg total) by mouth daily before breakfast. 90 tablet 3  . linaclotide (LINZESS) 72 MCG capsule Take 1 capsule (72 mcg total) by mouth daily before breakfast. 30 capsule 3  . loratadine (CLARITIN) 10 MG tablet Take 10 mg by mouth daily as needed for allergies.     . metoprolol tartrate (LOPRESSOR) 25 MG tablet Take 25 mg by mouth 2 (two) times daily.    Vladimir Faster Glycol-Propyl Glycol (LUBRICANT EYE DROPS) 0.4-0.3 % SOLN Place 1-2 drops into both eyes 3 (three) times daily as needed (for dry eyes.).    [name] warfarin (COUMADIN) 5 MG tablet Take 1 tablet daily except take 1/2 tablet on Monday and Friday or TAKE AS DIRECTED BY ANTICOAGULATION CLINIC  90 day 90 tablet 1   No current facility-administered medications for this visit.     Allergies:   Amiodarone hcl, Penicillins, and Statins   Social History:  The patient  reports that she has never smoked. She has never used smokeless tobacco. She reports current alcohol use. She reports that she does not use drugs.   Family History:  The patient's family history includes Alcohol abuse in her  father; Cancer in her father; Lung cancer in her father.    ROS:  Please see the history of present illness.  .   All other systems are reviewed and negative.    PHYSICAL EXAM: VS:  BP 122/62   Pulse 88   Ht 5\' 5"  (1.651 m)   Wt 185 lb 9.6 oz (84.2 kg)   SpO2 97%   BMI 30.89 kg/m  , BMI Body mass index is 30.89 kg/m. Well developed and nourished in no acute distress HENT normal Neck supple with JVP 8+ HJR Carotids brisk and full without bruits Clear Irregularly irregular rate and rhythm with controlled ventricular response, no murmurs or gallops Abd-soft with active BS without hepatomegaly No Clubbing cyanosis asymmetric left greater than right edema Skin-warm and dry A & Oriented  Grossly normal sensory and motor function  ECG:AF  @80  Intervals-/08/39    Axis -4     Recent Labs: 09/08/2018: ALT 9 10/27/2018: TSH 3.74 11/21/2018: BUN 25; Creatinine, Ser 0.80; Potassium 4.0; Sodium 142 12/28/2018: Hemoglobin 12.1; Platelets 176    Lipid Panel     Component Value Date/Time   CHOL 198 09/08/2018 0859   TRIG 97.0 09/08/2018 0859   HDL 38.90 (L) 09/08/2018 0859   CHOLHDL 5 09/08/2018 0859   VLDL 19.4 09/08/2018 0859   LDLCALC 139 (H) 09/08/2018 0859   LDLDIRECT 203.8 01/09/2008 0934     Wt Readings from Last 3 Encounters:  01/13/19 185 lb 9.6 oz (84.2 kg)  12/28/18 186 lb 6.4 oz (84.6 kg)  11/29/18 184 lb 8 oz (83.7 kg)      Other studies Reviewed: Additional studies/ records that were reviewed today include: labs as noted  Review of the above records today demonstrates:    ASSESSMENT AND PLAN: Atrial fibrillation-permanent  Hypothyroidism-treated     Dypsnea on exertion   Anemia   Alopecia with beta-blockers  Atrial fibrillation is now adequately  t rate controlled and perhaps indeed blunted.  Hence, we will decrease her diltiazem from 120 twice daily--daily and maintain her low-dose metoprolol.  (  She does not think she can cut the metoprolol tablets  in half).  It is possible if metoprolol is contributing to her lassitude as a drug effect as opposed to a heart rate effect.  She will be put herself back on the treadmill at home and see what happens with her heart rate when she walks.  We will anticipate the treadmill in the office in about 6 weeks.  We will also plan to have her measure her oxygen saturation at home with her treadmill.  Has pulse ox.  Significant "funk "related to the isolation of the pandemic.  She is grateful that her son has moved in.  More than 50% of 40 min was spent in counseling related to the above    So  Signed, Virl Axe, MD  01/13/2019 10:30 AM     [facility] [address] [place] [address] [phone] (office) [phone] (fax)

## 2019-01-16 ENCOUNTER — Ambulatory Visit (INDEPENDENT_AMBULATORY_CARE_PROVIDER_SITE_OTHER): Payer: Medicare Other | Admitting: Neurology

## 2019-01-16 ENCOUNTER — Encounter: Payer: Self-pay | Admitting: Neurology

## 2019-01-16 ENCOUNTER — Other Ambulatory Visit: Payer: Self-pay

## 2019-01-16 VITALS — BP 115/76 | HR 83 | Temp 98.0°F | Ht 65.0 in | Wt 186.0 lb

## 2019-01-16 DIAGNOSIS — R202 Paresthesia of skin: Secondary | ICD-10-CM | POA: Diagnosis not present
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
## 2019-01-24 ENCOUNTER — Telehealth: Payer: Self-pay

## 2019-01-24 NOTE — Telephone Encounter (Signed)
Pt notified of Dr. Tower's comments  

## 2019-01-24 NOTE — Telephone Encounter (Signed)
Patient contacted our office and stated that she is having some issues with abdominal pain, and nausea after eating. Patient states that she has had this issue in the past, and that Dr. Glori Bickers had referred her to GI with Dr. Vicente Males for further evaluation. Patient is wondering if she should come here and let Dr. Glori Bickers evaluate her for this issue again, or if she should just contact Dr. Vicente Males?

## 2019-01-24 NOTE — Telephone Encounter (Signed)
It this is recurrent- better to contact Dr Vicente Males  Let us know if she needs any help with this

## 2019-01-26 ENCOUNTER — Other Ambulatory Visit: Payer: Self-pay

## 2019-01-26 ENCOUNTER — Ambulatory Visit (INDEPENDENT_AMBULATORY_CARE_PROVIDER_SITE_OTHER): Payer: Medicare Other | Admitting: Gastroenterology

## 2019-01-26 VITALS — BP 119/76 | HR 86 | Temp 98.2°F | Ht 65.0 in | Wt 187.6 lb

## 2019-01-26 DIAGNOSIS — E739 Lactose intolerance, unspecified: Secondary | ICD-10-CM

## 2019-01-26 DIAGNOSIS — R110 Nausea: Secondary | ICD-10-CM | POA: Diagnosis not present

## 2019-01-26 MED ORDER — OMEPRAZOLE 40 MG PO CPDR: 40.0000 mg | DELAYED_RELEASE_CAPSULE | Freq: Every day | ORAL | 2 refills | Status: DC

## 2019-01-26 NOTE — Progress Notes (Signed)
[name] Bellows MD, MRCP(U.K) [address]  [place]  [place], [place] [address]  Main: [phone]  Fax: [phone]   Primary Care Physician: Key, [name] Fanny, MD  Primary Gastroenterologist:  Dr. Jonathon Key   Abdominal issues  HPI: [name] is a 85 y.o. female   Summary of history :  She has been previously seen at my office in November 2019 for rectal bleeding, iron deficiency anemia and constipation.  She has had her hemorrhoids resected at [facility].  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  She was treated with Linzess for constipation 72 mcg and was doing well.  12/28/2018 CBC, iron studies normal.  Capsule study of the small bowel showed a tiny nonbleeding AVM.  Interval history 02/09/2018-01/26/2019  She contacted Dr. Glori Bickers recently for abdominal pain and nausea.  She states that she has been having nausea for the past 1 year is usually with when she lays down.  Usually after meals.  She is not on any PPI.  She is also having softer than usual stools on and off.  She does admit to consuming whole milk on a regular basis.  Has gained weight.  No specific abdominal pain.  She does have her gallbladder intact  Current Outpatient Medications  Medication Sig Dispense Refill  . albuterol (PROVENTIL HFA;VENTOLIN HFA) 108 (90 Base) MCG/ACT inhaler Inhale 2 puffs into the lungs every 4 (four) hours as needed for wheezing or shortness of breath. 1 Inhaler 0  . alendronate (FOSAMAX) 70 MG tablet Take 1 tablet (70 mg total) by mouth every 7 (seven) days. Take with a full glass of water on an empty stomach. 12 tablet 1  . Cholecalciferol (VITAMIN D3) 2000 units TABS Take 4,000 Units by mouth daily.    . clindamycin (CLEOCIN) 150 MG capsule TAKE 4 CAPSULES BY MOUTH 1 HR PRIOR TO DENTAL APPT  0  . diltiazem (CARDIZEM CD) 120 MG 24 hr capsule Take 1 capsule (120 mg  total) by mouth daily. 90 capsule 3  . diphenhydrAMINE (BENADRYL) 25 mg capsule Take 50 mg by mouth every 6 (six) hours as needed for itching.    . ferrous sulfate 325 (65 FE) MG EC tablet Take 1 tablet (325 mg total) by mouth daily with breakfast. 90 tablet 3  . fluticasone (FLONASE) 50 MCG/ACT nasal spray Place 1 spray into both nostrils daily as needed for allergies.    . furosemide (LASIX) 40 MG tablet Take 1 tablet (40 mg total) by mouth daily. 90 tablet 3  . levothyroxine (SYNTHROID) 25 MCG tablet Take 1 tablet (25 mcg total) by mouth daily before breakfast. 90 tablet 3  . linaclotide (LINZESS) 72 MCG capsule Take 1 capsule (72 mcg total) by mouth daily before breakfast. 30 capsule 3  . loratadine (CLARITIN) 10 MG tablet Take 10 mg by mouth daily as needed for allergies.     . metoprolol tartrate (LOPRESSOR) 25 MG tablet Take 25 mg by mouth 2 (two) times daily.    Vladimir Faster Glycol-Propyl Glycol (LUBRICANT EYE DROPS) 0.4-0.3 % SOLN Place 1-2 drops into both eyes 3 (three) times daily as needed (for dry eyes.).    [name] warfarin (COUMADIN) 5 MG tablet Take 1 tablet daily except take 1/2 tablet on Monday and Friday or TAKE AS DIRECTED BY ANTICOAGULATION CLINIC  90 day 90 tablet 1   No current facility-administered medications for  this visit.     Allergies as of 01/26/2019 - Review Complete 01/16/2019  Allergen Reaction Noted  . Amiodarone hcl Swelling   . Penicillins Rash 04/27/2007  . Statins Rash     ROS:  General: Negative for anorexia, weight loss, fever, chills, fatigue, weakness. ENT: Negative for hoarseness, difficulty swallowing , nasal congestion. CV: Negative for chest pain, angina, palpitations, dyspnea on exertion, peripheral edema.  Respiratory: Negative for dyspnea at rest, dyspnea on exertion, cough, sputum, wheezing.  GI: See history of present illness. GU:  Negative for dysuria, hematuria, urinary incontinence, urinary frequency, nocturnal urination.  Endo: Negative  for unusual weight change.    Physical Examination:   There were no vitals taken for this visit.  General: Well-nourished, well-developed in no acute distress.  Eyes: No icterus. Conjunctivae pink. Mouth: Oropharyngeal mucosa moist and pink , no lesions erythema or exudate. Lungs: Clear to auscultation bilaterally. Non-labored. Heart: Regular rate and rhythm, no murmurs rubs or gallops.  Abdomen: Bowel sounds are normal, nontender, nondistended, no hepatosplenomegaly or masses, no abdominal bruits or hernia , no rebound or guarding.   Extremities: No lower extremity edema. No clubbing or deformities. Neuro: Alert and oriented x 3.  Grossly intact. Skin: Warm and dry, no jaundice.   Psych: Alert and cooperative, normal mood and affect.   Imaging Studies: No results found.  Assessment and Plan:   [name] is a 85 y.o. y/o female was previously been evaluated for iron deficiency anemia likely secondary to a hiatal hernia and AVMs.  Presently doing well from anemia point of view.  Has returned to see me for nausea and change in consistency of her stools for about a year.  My impression is that it is probably likely secondary to reflux due to the presence of an underlying hernia.  It is also possible that her change in her stool consistency is probably due to possible lactose intolerance due to consumption of whole milk.  Plan 1.  Trial of PPI, avoid eating for 2 hours before bedtime. 2.  Avoid dairy, can use soy milk or almond milk. 3.  If these interventions resolve the issue then we can gradually introduce milk back into her diet.  If she finds no relief then may need to consider evaluation of her gallbladder.    Dr [name] Bellows  MD,MRCP [facility]) Follow up in 2 weeks telephone visit

## 2019-01-26 NOTE — Addendum Note (Signed)
Addended by: Dorethea Clan on: 01/26/2019 03:06 PM   Modules accepted: Orders

## 2019-02-03 ENCOUNTER — Encounter: Payer: Self-pay | Admitting: *Deleted

## 2019-02-03 ENCOUNTER — Ambulatory Visit
Admission: EM | Admit: 2019-02-03 | Discharge: 2019-02-03 | Disposition: A | Discharge status: Home / Self Care | Payer: Medicare Other | Attending: Emergency Medicine | Admitting: Emergency Medicine

## 2019-02-03 DIAGNOSIS — R002 Palpitations: Secondary | ICD-10-CM | POA: Diagnosis not present

## 2019-02-03 NOTE — ED Triage Notes (Signed)
Patient with history of afib, was working out in the yard today and for the last 24hrs felt like heart was going faster than normal. Patient took an extra dose of diltiazem tonight. Diltiazem dosage was cut in half about 3 weeks ago. Heart rate in the 90s on pulse ox during intake. Reports feel like chest is quivery, states can't relax. Denies chest pain or shortness of breath.

## 2019-02-03 NOTE — ED Provider Notes (Signed)
[name]    CSN: [id number] Arrival date & time: 02/03/19  1753      History   Chief Complaint Chief Complaint  Patient presents with  . Palpitations    HPI [name] is a 85 y.o. female.   Patient presents with feeling of palpitations since yesterday when she was doing yard work.  She denies dizziness, chest pain, shortness of breath, weakness, nausea, diaphoresis, or other symptoms.  She attempted treatment at home by taking an extra dose of diltiazem today; she states her dosage was decreased 3 weeks ago by her cardiologist.  Medical history is significant for atrial fibrillation and heart failure.     The history is provided by the patient.    Past Medical History:  Diagnosis Date  . Allergic rhinitis   . 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 Arthritis   . Atrial fibrillation -persistent cardiologist-  dr klein/  primary EP -- dr Tawanna Sat ([facility])   a. s/p PVI [name] 2010;  b. on tikosyn/coumadin;  c. 05/2009 Echo: EF 60-65%, Gr 2 DD. (first dx 09/ 2007)  . Bilateral lower extremity edema   . Bleeding hemorrhoid   . Carotid stenosis    mild (hosp 3/11)- consult by vasc/ Dr Donnetta Hutching  . Complication of anesthesia    hard to wake  . Diverticulosis of colon   . Dyspnea    on exertion-climbing stairs  . Dysrhythmia   . Fatty liver   . H/O cardiac radiofrequency ablation    01/ 2008 at [facility] /  03/ 2010  at [facility]  . Heart failure with preserved ejection fraction ([place])   . Heart murmur    "prior to valve repair"  . History of adenomatous polyp of colon    tubular adenoma's  . History of cardiomyopathy    secondary tachycardia-induced cardiomyopathy -- resolved 2014  . History of squamous cell carcinoma in situ (SCCIS) of skin    05/ 2017  nasal bridge and right medial knee  . History of transient ischemic attack (TIA)    01-24-2005 and 06-12-2009  . Hyperlipidemia   . Hypothyroidism   . Mild intermittent asthma    reacts to cats  .  Mixed stress and urge urinary incontinence   . Pulmonary nodule   . S/P mitral valve repair 10-23-1998  dr Boyce Medici at [facility]   for MVP and regurg. (annuloplasty ring procedure)  . Swelling of left extremity 2017   states it's gotten worse  . Varicose vein of leg   . Wears glasses     Patient Active Problem List   Diagnosis Date Noted  . Facial tingling 11/29/2018  . Tremor of left hand 11/29/2018  . Medicare annual wellness visit, subsequent 11/24/2018  . Dysuria 02/06/2018  . Iron deficiency anemia 10/21/2017  . Rapid atrial fibrillation ([place]) 08/19/2017  . Constipation 08/02/2017  . Numbness and tingling 07/14/2017  . Paresthesia 07/14/2017  . Unilateral primary osteoarthritis, left hip 05/04/2017  . Status post total replacement of left hip 05/04/2017  . Hip osteoarthritis 04/27/2017  . Long term (current) use of anticoagulants 03/04/2017  . Venous stasis dermatitis of both lower extremities 01/08/2017  . Impacted cerumen of right ear 11/20/2016  . Osteopenia 10/25/2016  . Pedal edema 08/26/2016  . Varicose veins of both lower extremities 08/26/2016  . Estrogen deficiency 08/26/2016  . Screening mammogram, encounter for 08/26/2016  . Hemorrhoids 08/26/2016  . History of nonmelanoma skin cancer 01/01/2016  . Hip  pain 08/02/2014  . Left knee pain 08/02/2014  . Chronic cough 05/08/2014  . Caregiver stress 08/16/2013  . Colon cancer screening 08/16/2013  . Encounter for therapeutic drug monitoring 04/20/2013  . Left ovarian cyst 03/14/2013  . (HFpEF) heart failure with preserved ejection fraction ([place]) 12/27/2012  . Cardiomyopathy, secondary --Resolved again 10/14 10/13/2010  . COLONIC POLYPS, ADENOMATOUS, HX OF 09/18/2009  . PULMONARY NODULE 12/20/2008  . GANGLION CYST 10/04/2007  . Hyperlipidemia 04/27/2007  . Depression with anxiety 04/27/2007  . Asthma, mild intermittent 04/27/2007  . INSOMNIA 04/27/2007  . ADENOMATOUS COLONIC POLYP 11/04/2006  .  Hypothyroidism 09/02/2006  . Atrial fibrillation ([place]) 08/05/2006    Past Surgical History:  Procedure Laterality Date  . APPENDECTOMY  1978  . CARDIAC ELECTROPHYSIOLOGY [place] AND ABLATION  01/ 2008    at [place]   right-sided ablation atrial flutter  . CARDIAC ELECTROPHYSIOLOGY STUDY AND ABLATION  03/ 2010   dr Jaymes Graff at [facility]   AV node ablation and pulmonary vein isolation for atrial fib  . CARDIOVERSION  06-18-2006;  07-13-2006;  10-19-2010;  10-27-2010  . COLONOSCOPY    . COLONOSCOPY WITH PROPOFOL N/A 10/13/2017   Procedure: COLONOSCOPY WITH PROPOFOL;  Surgeon: [name], MD;  Location: [facility] ENDOSCOPY;  Service: Gastroenterology;  Laterality: N/A;  . CYSTO/ TRANSURETHRAL COLLAGEN INJECTION THERAPY  07-26-2007   dr Matilde Sprang  . DILATION AND CURETTAGE OF UTERUS    . ESOPHAGOGASTRODUODENOSCOPY (EGD) WITH PROPOFOL N/A 10/13/2017   Procedure: ESOPHAGOGASTRODUODENOSCOPY (EGD) WITH PROPOFOL;  Surgeon: [name], MD;  Location: [facility] ENDOSCOPY;  Service: Gastroenterology;  Laterality: N/A;  . EXCISIONAL HEMORRHOIDECTOMY  1980s  . GIVENS CAPSULE STUDY N/A 12/08/2017   Procedure: GIVENS CAPSULE STUDY;  Surgeon: [name], MD;  Location: [facility] ENDOSCOPY;  Service: Gastroenterology;  Laterality: N/A;  . HEMORRHOID SURGERY N/A 10/29/2016   Procedure: HEMORRHOIDECTOMY;  Surgeon: Leighton Ruff, MD;  Location: [facility];  Service: General;  Laterality: N/A;  . MITRAL VALVE ANNULOPLASTY  10/23/1998   "Model [id number]; Campbell Lerner [id number]"; size 27mm; [facility]; Dr. Boyce Medici  . PILONIDAL CYST EXCISION  1954  . TEE WITH CARDIOVERSION  05-06-2006 at [facility];  01-02-2013 at [facility]  . TOTAL HIP ARTHROPLASTY Left 05/04/2017   Procedure: LEFT TOTAL HIP ARTHROPLASTY ANTERIOR APPROACH;  Surgeon: Mcarthur Rossetti, MD;  Location: [place];  Service: Orthopedics;  Laterality: Left;  . TRANSTHORACIC ECHOCARDIOGRAM  05-01-2015   dr Caryl Comes   ef 50-55%/  mild AV sclerosis without  stenosis/  post MV repair with mild central MR (valve area by pressure half-time 2cm^2,  valve area by continutity equation 0.91cm^2, peak grandiant 44mmHg)/  severe LAE/ mild TR/ mild RAE   . TUBAL LIGATION Bilateral 1978    OB History   No obstetric history on file.      Home Medications    Prior to Admission medications   Medication Sig Start Date End Date Taking? Authorizing Provider  albuterol (PROVENTIL HFA;VENTOLIN HFA) 108 (90 Base) MCG/ACT inhaler Inhale 2 puffs into the lungs every 4 (four) hours as needed for wheezing or shortness of breath. 06/23/16   Tower, Wynelle Fanny, MD  alendronate (FOSAMAX) 70 MG tablet Take 1 tablet (70 mg total) by mouth every 7 (seven) days. Take with a full glass of water on an empty stomach. 09/27/18   Tower, Wynelle Fanny, MD  Cholecalciferol (VITAMIN D3) 2000 units TABS Take 4,000 Units by mouth daily.    [provider]  clindamycin (CLEOCIN) 150 MG capsule TAKE 4  CAPSULES BY MOUTH 1 HR PRIOR TO DENTAL APPT 09/06/17   [provider]  diltiazem (CARDIZEM CD) 120 MG 24 hr capsule Take 1 capsule (120 mg total) by mouth daily. 01/13/19 04/13/19  Deboraha Sprang, MD  diphenhydrAMINE (BENADRYL) 25 mg capsule Take 50 mg by mouth every 6 (six) hours as needed for itching.    [provider]  ferrous sulfate 325 (65 FE) MG EC tablet Take 1 tablet (325 mg total) by mouth daily with breakfast. 11/29/18   Tower, Wynelle Fanny, MD  fluticasone (FLONASE) 50 MCG/ACT nasal spray Place 1 spray into both nostrils daily as needed for allergies.    [provider]  furosemide (LASIX) 40 MG tablet Take 1 tablet (40 mg total) by mouth daily. 01/13/19 04/13/19  Deboraha Sprang, MD  levothyroxine (SYNTHROID) 25 MCG tablet Take 1 tablet (25 mcg total) by mouth daily before breakfast. 11/29/18   Tower, Wynelle Fanny, MD  linaclotide [facility]) 72 MCG capsule Take 1 capsule (72 mcg total) by mouth daily before breakfast. Patient not taking: Reported on 01/26/2019 11/11/17    [name], MD  loratadine (CLARITIN) 10 MG tablet Take 10 mg by mouth daily as needed for allergies.     [provider]  metoprolol tartrate (LOPRESSOR) 25 MG tablet Take 25 mg by mouth 2 (two) times daily.    [provider]  omeprazole (PRILOSEC) 40 MG capsule Take 1 capsule (40 mg total) by mouth daily. 01/26/19 04/26/19  [name], MD  Polyethyl Glycol-Propyl Glycol (LUBRICANT EYE DROPS) 0.4-0.3 % SOLN Place 1-2 drops into both eyes 3 (three) times daily as needed (for dry eyes.).    [provider]  warfarin (COUMADIN) 5 MG tablet Take 1 tablet daily except take 1/2 tablet on Monday and Friday or TAKE AS DIRECTED BY ANTICOAGULATION CLINIC  90 day 10/11/18   Tower, Wynelle Fanny, MD    Family History Family History  Problem Relation Age of Onset  . Lung cancer Father        smoker  . Alcohol abuse Father   . Cancer Father        bladder and lung CA smoker  . Breast cancer Neg Hx     Social History Social History   Tobacco Use  . Smoking status: Never Smoker  . Smokeless tobacco: Never Used  Substance Use Topics  . Alcohol use: Yes    Alcohol/week: 0.0 standard drinks    Comment: seldom  . Drug use: No     Allergies   Amiodarone hcl, Penicillins, and Statins   Review of Systems Review of Systems  Constitutional: Negative for chills and fever.  HENT: Negative for ear pain and sore throat.   Eyes: Negative for pain and visual disturbance.  Respiratory: Negative for cough and shortness of breath.   Cardiovascular: Positive for palpitations. Negative for chest pain and leg swelling.  Gastrointestinal: Negative for abdominal pain and vomiting.  Genitourinary: Negative for dysuria and hematuria.  Musculoskeletal: Negative for arthralgias and back pain.  Skin: Negative for color change and rash.  Neurological: Negative for dizziness, seizures, syncope, facial asymmetry, speech difficulty, weakness, light-headedness, numbness and headaches.  All other  systems reviewed and are negative.    Physical Exam Triage Vital Signs ED Triage Vitals  Enc Vitals Group     BP      Pulse      Resp      Temp      Temp src  SpO2      Weight      Height      Head Circumference      Peak Flow      Pain Score      Pain Loc      Pain Edu?      Excl. in [place]?    No data found.  Updated Vital Signs BP 114/73 (BP Location: Left Arm)   Pulse 94   Temp 97.7 F (36.5 C) (Oral)   Resp 19   SpO2 98%   Visual Acuity Right Eye Distance:   Left Eye Distance:   Bilateral Distance:    Right Eye Near:   Left Eye Near:    Bilateral Near:     Physical Exam Vitals signs and nursing note reviewed.  Constitutional:      General: She is not in acute distress.    Appearance: She is well-developed. She is not ill-appearing.  HENT:     Head: Normocephalic and atraumatic.     Mouth/Throat:     Mouth: Mucous membranes are moist.     Pharynx: Oropharynx is clear.  Eyes:     Conjunctiva/sclera: Conjunctivae normal.  Neck:     Musculoskeletal: Neck supple.  Cardiovascular:     Rate and Rhythm: Normal rate. Rhythm irregular.     Heart sounds: No murmur.  Pulmonary:     Effort: Pulmonary effort is normal. No respiratory distress.     Breath sounds: Normal breath sounds.  Abdominal:     General: Bowel sounds are normal.     Palpations: Abdomen is soft.     Tenderness: There is no abdominal tenderness. There is no guarding or rebound.  Musculoskeletal:     Right lower leg: No edema.     Left lower leg: No edema.  Skin:    General: Skin is warm and dry.     Capillary Refill: Capillary refill takes less than 2 seconds.     Findings: No rash.  Neurological:     General: No focal deficit present.     Mental Status: She is alert and oriented to person, place, and time.     Sensory: No sensory deficit.     Motor: No weakness.     Coordination: Coordination normal.     Gait: Gait normal.  Psychiatric:        Mood and Affect: Mood normal.         Behavior: Behavior normal.      UC Treatments / Results  Labs (all labs ordered are listed, but only abnormal results are displayed) Labs Reviewed - No data to display  EKG   Radiology No results found.  Procedures Procedures (including critical care time)  Medications Ordered in UC Medications - No data to display  Initial Impression / Assessment and Plan / UC Course  I have reviewed the triage vital signs and the nursing notes.  Pertinent labs & imaging results that were available during my care of the patient were reviewed by me and considered in my medical decision making (see chart for details).    Heart palpitations.  EKG shows atrial fibrillation with a rate of 95, no ST elevation.  Instructed patient to go to the emergency department if she has worsening symptoms or develops new symptoms such as chest pain, shortness of breath, weakness, dizziness, or other concerning symptoms.  Instructed her to follow-up with her primary care provider or her cardiologist on Monday.  Instructed patient to take her medications  as prescribed.  Patient agrees to plan of care.     Final Clinical Impressions(s) / UC Diagnoses   Final diagnoses:  Heart palpitations     Discharge Instructions     Go to the emergency department if you have worsening symptoms or develop new symptoms such as chest pain, shortness of breath, weakness, dizziness, or other concerning symptoms.    Follow-up with your primary care provider or your cardiologist on Monday.  Take your medications as prescribed.         ED Prescriptions    None     PDMP not reviewed this encounter.   Sharion Balloon, NP 02/03/19 1820

## 2019-02-03 NOTE — Discharge Instructions (Signed)
Go to the emergency department if you have worsening symptoms or develop new symptoms such as chest pain, shortness of breath, weakness, dizziness, or other concerning symptoms.    Follow-up with your primary care provider or your cardiologist on Monday.  Take your medications as prescribed.

## 2019-02-09 ENCOUNTER — Other Ambulatory Visit: Payer: Self-pay

## 2019-02-09 ENCOUNTER — Ambulatory Visit (INDEPENDENT_AMBULATORY_CARE_PROVIDER_SITE_OTHER): Payer: Medicare Other | Admitting: General Practice

## 2019-02-09 DIAGNOSIS — I4891 Unspecified atrial fibrillation: Secondary | ICD-10-CM

## 2019-02-09 DIAGNOSIS — Z7901 Long term (current) use of anticoagulants: Secondary | ICD-10-CM

## 2019-02-09 LAB — POCT INR: INR: 3.1 — AB (ref 2.0–3.0)

## 2019-02-09 NOTE — Patient Instructions (Signed)
Pre visit review using our clinic review tool, if applicable. No additional management support is needed unless otherwise documented below in the visit note.  Hold dosage today and then continue to take 1 pill (5mg ) daily EXCEPT for 1/2 pill (2.5mg ) on Mondays and Fridays.   Re-check in 4 weeks.

## 2019-02-13 ENCOUNTER — Ambulatory Visit (INDEPENDENT_AMBULATORY_CARE_PROVIDER_SITE_OTHER): Payer: Medicare Other | Admitting: Gastroenterology

## 2019-02-13 ENCOUNTER — Other Ambulatory Visit: Payer: Self-pay

## 2019-02-13 DIAGNOSIS — E739 Lactose intolerance, unspecified: Secondary | ICD-10-CM | POA: Diagnosis not present

## 2019-02-13 DIAGNOSIS — R110 Nausea: Secondary | ICD-10-CM

## 2019-02-13 NOTE — Progress Notes (Signed)
[name] , MD [address]  [place]  [place], [place] [address]  Main: [phone]  Fax: [phone]   Primary Care Physician: Tower, Wynelle Fanny, MD  Virtual Visit via Telephone Note  I connected with patient on 02/13/19 at  1:00 PM EST by telephone and verified that I am speaking with the correct person using two identifiers.   I discussed the limitations, risks, security and privacy concerns of performing an evaluation and management service by telephone and the availability of in person appointments. I also discussed with the patient that there may be a patient responsible charge related to this service. The patient expressed understanding and agreed to proceed.  Location of Patient: Home Location of Provider: Home Persons involved: Patient and provider only   History of Present Illness: No chief complaint on file.   HPI: [name] is a 85 y.o. female   Summary of history :  She has been previously seen at my office for rectal bleeding, iron deficiency anemia and constipation.  She has had her hemorrhoids resected at [facility].  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  She was treated with Linzess for constipation 72 mcg and was doing well.  12/28/2018 CBC, iron studies normal.  Capsule study of the small bowel showed a tiny nonbleeding AVM.  Interval history 01/26/2019-02/13/2019  No symptoms after she cut down on dairy.  She did not even need to go and obtain her Prilosec prescription.  She has changed to almond milk and has no complaints whatsoever at this point.     Current Outpatient Medications  Medication Sig Dispense Refill  . albuterol (PROVENTIL HFA;VENTOLIN HFA) 108 (90 Base) MCG/ACT inhaler Inhale 2 puffs into the lungs every 4 (four) hours as needed for wheezing or shortness of breath. 1 Inhaler 0  . alendronate (FOSAMAX) 70  MG tablet Take 1 tablet (70 mg total) by mouth every 7 (seven) days. Take with a full glass of water on an empty stomach. 12 tablet 1  . Cholecalciferol (VITAMIN D3) 2000 units TABS Take 4,000 Units by mouth daily.    . clindamycin (CLEOCIN) 150 MG capsule TAKE 4 CAPSULES BY MOUTH 1 HR PRIOR TO DENTAL APPT  0  . diltiazem (CARDIZEM CD) 120 MG 24 hr capsule Take 1 capsule (120 mg total) by mouth daily. 90 capsule 3  . diphenhydrAMINE (BENADRYL) 25 mg capsule Take 50 mg by mouth every 6 (six) hours as needed for itching.    . ferrous sulfate 325 (65 FE) MG EC tablet Take 1 tablet (325 mg total) by mouth daily with breakfast. 90 tablet 3  . fluticasone (FLONASE) 50 MCG/ACT nasal spray Place 1 spray into both nostrils daily as needed for allergies.    . furosemide (LASIX) 40 MG tablet Take 1 tablet (40 mg total) by mouth daily. 90 tablet 3  . levothyroxine (SYNTHROID) 25 MCG tablet Take 1 tablet (25 mcg total) by mouth daily before breakfast. 90 tablet 3  . linaclotide (LINZESS) 72 MCG capsule Take 1 capsule (72 mcg total) by mouth daily before breakfast. (Patient not taking: Reported on 01/26/2019) 30 capsule 3  . loratadine (CLARITIN) 10 MG tablet Take 10 mg by mouth daily as needed for allergies.     . metoprolol tartrate (LOPRESSOR) 25 MG tablet Take 25 mg by mouth 2 (two) times daily.    [name] omeprazole (PRILOSEC)  40 MG capsule Take 1 capsule (40 mg total) by mouth daily. 30 capsule 2  . Polyethyl Glycol-Propyl Glycol (LUBRICANT EYE DROPS) 0.4-0.3 % SOLN Place 1-2 drops into both eyes 3 (three) times daily as needed (for dry eyes.).    [name] warfarin (COUMADIN) 5 MG tablet Take 1 tablet daily except take 1/2 tablet on Monday and Friday or TAKE AS DIRECTED BY ANTICOAGULATION CLINIC  90 day 90 tablet 1   No current facility-administered medications for this visit.     Allergies as of 02/13/2019 - Review Complete 02/03/2019  Allergen Reaction Noted  . Amiodarone hcl Swelling   . Penicillins Rash  04/27/2007  . Statins Rash     Review of Systems:    All systems reviewed and negative except where noted in HPI.   Observations/Objective:  Labs: CMP     Component Value Date/Time   NA 142 11/21/2018 1124   NA 141 06/05/2016 1352   NA 139 06/26/2013 2220   K 4.0 11/21/2018 1124   K 3.5 06/26/2013 2220   CL 105 11/21/2018 1124   CL 106 06/26/2013 2220   CO2 28 11/21/2018 1124   CO2 27 06/26/2013 2220   GLUCOSE 133 (H) 11/21/2018 1124   GLUCOSE 136 (H) 06/26/2013 2220   BUN 25 (H) 11/21/2018 1124   BUN 27 06/05/2016 1352   BUN 14 06/26/2013 2220   CREATININE 0.80 11/21/2018 1124   CREATININE 0.87 02/04/2018 1701   CALCIUM 9.1 11/21/2018 1124   CALCIUM 8.3 (L) 06/26/2013 2220   PROT 6.9 09/08/2018 0859   PROT 7.1 02/28/2013 1110   ALBUMIN 4.0 09/08/2018 0859   ALBUMIN 3.6 02/28/2013 1110   AST 14 09/08/2018 0859   AST 25 02/28/2013 1110   ALT 9 09/08/2018 0859   ALT 19 02/28/2013 1110   ALKPHOS 44 09/08/2018 0859   ALKPHOS 48 02/28/2013 1110   BILITOT 0.6 09/08/2018 0859   BILITOT 0.6 02/28/2013 1110   GFRNONAA >60 11/21/2018 1124   GFRNONAA 70 05/15/2015 0959   GFRAA >60 11/21/2018 1124   GFRAA 81 05/15/2015 0959   Lab Results  Component Value Date   WBC 5.9 12/28/2018   HGB 12.1 12/28/2018   HCT 37.8 12/28/2018   MCV 86.7 12/28/2018   PLT 176 12/28/2018    Imaging Studies: No results found.  Assessment and Plan:   [name] is a 85 y.o. y/o female  was previously been evaluated for iron deficiency anemia likely secondary to a hiatal hernia and AVMs.  Presently doing well from anemia point of view.    Last visit she had some nausea and change in consistency of her stools.  It appears that it is probably due to lactose intolerance.  Symptoms have resolved after backing off on milk and substituting with almond milk.  She has no reflux symptoms whatsoever.  She will call me if the symptoms recur.  She is not requiring any form of a PPI use to control any  of her symptoms.  I suggested her that if she has no symptoms not to take any PPI..     I discussed the assessment and treatment plan with the patient. The patient was provided an opportunity to ask questions and all were answered. The patient agreed with the plan and demonstrated an understanding of the instructions.   The patient was advised to call back or seek an in-person evaluation if the symptoms worsen or if the condition fails to improve as anticipated.  I provided 12  minutes of non-face-to-face time during this encounter.  Dr [name] Bellows MD,MRCP [facility]) Gastroenterology/Hepatology Pager: [phone]   Speech recognition software was used to dictate this note.

## 2019-02-20 ENCOUNTER — Ambulatory Visit: Payer: Medicare Other

## 2019-02-21 ENCOUNTER — Other Ambulatory Visit
Admission: RE | Admit: 2019-02-21 | Discharge: 2019-02-21 | Disposition: A | Discharge status: Home / Self Care | Payer: Medicare Other | Source: Ambulatory Visit | Attending: Internal Medicine | Admitting: Internal Medicine

## 2019-02-21 DIAGNOSIS — Z01812 Encounter for preprocedural laboratory examination: Secondary | ICD-10-CM | POA: Diagnosis not present

## 2019-02-21 DIAGNOSIS — Z20828 Contact with and (suspected) exposure to other viral communicable diseases: Secondary | ICD-10-CM | POA: Insufficient documentation

## 2019-02-21 LAB — SARS CORONAVIRUS 2 (TAT 6-24 HRS): SARS Coronavirus 2: NEGATIVE

## 2019-02-23 ENCOUNTER — Other Ambulatory Visit: Payer: Self-pay

## 2019-02-23 ENCOUNTER — Ambulatory Visit (INDEPENDENT_AMBULATORY_CARE_PROVIDER_SITE_OTHER): Payer: Medicare Other | Admitting: Internal Medicine

## 2019-02-23 DIAGNOSIS — I4821 Permanent atrial fibrillation: Secondary | ICD-10-CM | POA: Diagnosis not present

## 2019-02-23 DIAGNOSIS — I509 Heart failure, unspecified: Secondary | ICD-10-CM

## 2019-02-23 DIAGNOSIS — I10 Essential (primary) hypertension: Secondary | ICD-10-CM

## 2019-02-23 MED ORDER — METOPROLOL TARTRATE 50 MG PO TABS: 50.0000 mg | ORAL_TABLET | Freq: Two times a day (BID) | ORAL | 3 refills | Status: DC

## 2019-02-23 NOTE — Patient Instructions (Signed)
Medication Instructions:  - Your physician has recommended you make the following change in your medication:   1) Increase lopressor (metoprolol tartrate) to 50 mg- take 1 tablet (50 mg) by mouth twice daily  *If you need a refill on your cardiac medications before your next appointment, please call your pharmacy*  Lab Work: - none ordered  If you have labs (blood work) drawn today and your tests are completely normal, you will receive your results only by: [name] MyChart Message (if you have MyChart) OR . A paper copy in the mail If you have any lab test that is abnormal or we need to change your treatment, we will call you to review the results.  Testing/Procedures: - none ordered  Follow-Up: At [facility], you and your health needs are our priority.  As part of our continuing mission to provide you with exceptional heart care, we have created designated Provider Care Teams.  These Care Teams include your primary Cardiologist (physician) and Advanced Practice Providers (APPs -  Physician Assistants and Nurse Practitioners) who all work together to provide you with the care you need, when you need it.  Your next appointment:   4 week(s)  The format for your next appointment:   Virtual Visit   Provider:   Virl Axe, MD  Other Instructions n/a

## 2019-03-09 ENCOUNTER — Other Ambulatory Visit: Payer: Self-pay

## 2019-03-09 ENCOUNTER — Ambulatory Visit (INDEPENDENT_AMBULATORY_CARE_PROVIDER_SITE_OTHER): Payer: Medicare Other | Admitting: General Practice

## 2019-03-09 DIAGNOSIS — Z7901 Long term (current) use of anticoagulants: Secondary | ICD-10-CM | POA: Diagnosis not present

## 2019-03-09 LAB — POCT INR: INR: 2.8 (ref 2.0–3.0)

## 2019-03-09 NOTE — Patient Instructions (Addendum)
Pre visit review using our clinic review tool, if applicable. No additional management support is needed unless otherwise documented below in the visit note.  Continue to take 1 pill (5mg ) daily EXCEPT for 1/2 pill (2.5mg ) on Mondays and Fridays.   Re-check in 4 weeks.

## 2019-03-20 LAB — EXERCISE TOLERANCE TEST
Estimated workload: 3.4 METS
Exercise duration (min): 1 min
Exercise duration (sec): 28 s
MPHR: 136 {beats}/min
Peak HR: 136 {beats}/min
Percent HR: 107 %
Rest HR: 118 {beats}/min

## 2019-03-28 ENCOUNTER — Ambulatory Visit: Payer: Medicare Other | Admitting: Internal Medicine

## 2019-03-29 ENCOUNTER — Encounter: Payer: Self-pay | Admitting: Internal Medicine

## 2019-03-31 ENCOUNTER — Telehealth: Payer: Self-pay | Admitting: Internal Medicine

## 2019-03-31 NOTE — Telephone Encounter (Signed)
  Pt c/o medication issue:  1. Name of Medication:  metoprolol tartrate (LOPRESSOR) 50 MG tablet diltiazem (CARDIZEM CD) 120 MG 24 hr capsule  2. How are you currently taking this medication (dosage and times per day)? Metoprolol 1 tablet in the morning 1 in the evening. Diltiazem 1 tablet in the morning.  3. Are you having a reaction (difficulty breathing--STAT)? no  4. What is your medication issue? Patient states she accidentally took 2 metoprolol  and 1 diltiazem this morning. She would like to know what she should do. She is not having any symptoms. Patient also states her appointment on 1/5 was supposed to be virtual.

## 2019-03-31 NOTE — Telephone Encounter (Signed)
Returned call to Pt.  Advised Pt she may be tired today, she should not drive.  Advised to increase fluids and a little more salt for her blood pressure today. Pt does not have a home blood pressure cuff.  Advised if she feels fine all day she should take her evening metoprolol dose.  Advised if she didn't feel well today to skip tonight's dose.  Pt thanked nurse for call.

## 2019-04-03 ENCOUNTER — Ambulatory Visit (INDEPENDENT_AMBULATORY_CARE_PROVIDER_SITE_OTHER): Payer: Self-pay

## 2019-04-03 ENCOUNTER — Other Ambulatory Visit: Payer: Self-pay

## 2019-04-03 ENCOUNTER — Encounter: Payer: Self-pay | Admitting: Surgery

## 2019-04-03 DIAGNOSIS — I8393 Asymptomatic varicose veins of bilateral lower extremities: Secondary | ICD-10-CM

## 2019-04-03 NOTE — Progress Notes (Signed)
Treated pt's bilateral leg spider veins with 1% Asclera. Used a total of 2 units/4 mL of 1% Asclera. Pt has several larger clusters of spider veins and will need future treatments. She is aware and understands and will call to make appt after seeing how this first treatment responded. Dr. Trula Slade came in to assess spider veins before start of treatment. Pt. Tolerated excellently. Provided post procedure instructions both verbally and on handout. Compression stockings applied. See attached pre procedure photographs. Will follow PRN.

## 2019-04-06 ENCOUNTER — Ambulatory Visit (INDEPENDENT_AMBULATORY_CARE_PROVIDER_SITE_OTHER): Payer: Medicare Other | Admitting: General Practice

## 2019-04-06 ENCOUNTER — Other Ambulatory Visit: Payer: Self-pay

## 2019-04-06 DIAGNOSIS — Z7901 Long term (current) use of anticoagulants: Secondary | ICD-10-CM | POA: Diagnosis not present

## 2019-04-06 LAB — POCT INR: INR: 2.3 (ref 2.0–3.0)

## 2019-04-06 NOTE — Patient Instructions (Signed)
Pre visit review using our clinic review tool, if applicable. No additional management support is needed unless otherwise documented below in the visit note.  Continue to take 1 pill (5mg ) daily EXCEPT for 1/2 pill (2.5mg ) on Mondays and Fridays.   Re-check in 6 weeks.

## 2019-04-18 ENCOUNTER — Other Ambulatory Visit: Payer: Self-pay | Admitting: Gastroenterology

## 2019-04-18 NOTE — Telephone Encounter (Signed)
Last office visit 02/13/2019 Nausea lactose intolerance  Last refill omeprazole 01/26/2019 30 tablets 2 refills

## 2019-04-20 ENCOUNTER — Telehealth: Payer: Self-pay

## 2019-04-20 NOTE — Telephone Encounter (Signed)
Pt called with questions regarding her sclerotherapy results. She is 2 weeks out from her first treatment. She felt there is some slight fading on the smaller veins but the larger ones are still present. We had discussed the likelihood of further treatments at her initial appt. We discussed this again today and she is going to give it another 2 weeks and see if anymore fading occurs. No further questions/concerns at this time.

## 2019-04-22 ENCOUNTER — Other Ambulatory Visit: Payer: Self-pay | Admitting: Gastroenterology

## 2019-04-25 ENCOUNTER — Ambulatory Visit
Admission: RE | Admit: 2019-04-25 | Discharge: 2019-04-25 | Disposition: A | Discharge status: Home / Self Care | Payer: Medicare Other | Source: Ambulatory Visit | Attending: Family Medicine | Admitting: Family Medicine

## 2019-04-25 DIAGNOSIS — E2839 Other primary ovarian failure: Secondary | ICD-10-CM | POA: Insufficient documentation

## 2019-04-25 DIAGNOSIS — Z780 Asymptomatic menopausal state: Secondary | ICD-10-CM | POA: Diagnosis not present

## 2019-04-25 DIAGNOSIS — Z1231 Encounter for screening mammogram for malignant neoplasm of breast: Secondary | ICD-10-CM | POA: Diagnosis not present

## 2019-04-25 DIAGNOSIS — M8589 Other specified disorders of bone density and structure, multiple sites: Secondary | ICD-10-CM | POA: Diagnosis not present

## 2019-04-28 ENCOUNTER — Telehealth: Payer: Self-pay | Admitting: Internal Medicine

## 2019-04-28 NOTE — Telephone Encounter (Signed)
New Message     Pt says she started an exercise Class and Monday during the class she noticed some tingling and was out of breath. She rested and then went to her Exercise class another day this week and noticed the same symptoms and she is concerned. She says she is stopping the exercise class but thinks she needs to see Dr Caryl Comes sooner then scheduled appt. She doesn't want to see a PA    Please advise

## 2019-04-28 NOTE — Telephone Encounter (Signed)
Left message to call office

## 2019-05-01 NOTE — Telephone Encounter (Signed)
Follow Up   Pt was returning phone call  Please call back

## 2019-05-01 NOTE — Telephone Encounter (Signed)
Spoke with pt who states she has been attending an exercise class x 1 year and most recently began experiencing pressure in her face as well as some tingling. Feels she has also had some weakness in her legs.  She has stopped her exercise class for these reasons.  Pt states she has a history of angina and Afib.  Denies current CP, SOB, weakness greater on one side or speech problems.  Pt states she became concerned a few weeks ago and went to [facility] for evaluation and was told she was not having a stroke which eased her mind.  Pt states she does not have a B/P cuff or a way to check her heart rate at home. Pt advised it would be reasonable to purchase a home B/P cuff.  Pt states she has been taking medications as prescribed and drinking an adequate amount of water,  Pt states she does not eat properly d/t living alone and snacks mostly rather than eating a meal.  Pt advised it is important to get the recommended amount of vitamins and minerals as this can also affect how she is feeling.  Appointment has been scheduled for 05/29/2019 with Dr Caryl Comes for further evaluation. Advised pt should she develop active CP, SOB weakness greater on one side, speech problems she should go to ED for further evaluation.  Pt verbalizes understanding and agrees with current plan.

## 2019-05-18 ENCOUNTER — Other Ambulatory Visit: Payer: Self-pay

## 2019-05-18 ENCOUNTER — Ambulatory Visit (INDEPENDENT_AMBULATORY_CARE_PROVIDER_SITE_OTHER): Payer: Medicare Other

## 2019-05-18 DIAGNOSIS — Z7901 Long term (current) use of anticoagulants: Secondary | ICD-10-CM | POA: Diagnosis not present

## 2019-05-18 LAB — POCT INR: INR: 2.1 (ref 2.0–3.0)

## 2019-05-18 NOTE — Patient Instructions (Addendum)
Pre visit review using our clinic review tool, if applicable. No additional management support is needed unless otherwise documented below in the visit note.  Continue to take 1 pill (5mg ) daily EXCEPT for 1/2 pill (2.5mg ) on Mondays and Fridays.   Re-check in 6 weeks.

## 2019-05-26 ENCOUNTER — Encounter: Payer: Self-pay | Admitting: Internal Medicine

## 2019-05-26 ENCOUNTER — Other Ambulatory Visit: Payer: Self-pay

## 2019-05-26 ENCOUNTER — Ambulatory Visit (INDEPENDENT_AMBULATORY_CARE_PROVIDER_SITE_OTHER): Payer: Medicare Other | Admitting: Internal Medicine

## 2019-05-26 VITALS — BP 116/72 | HR 76 | Ht 65.0 in | Wt 194.0 lb

## 2019-05-26 DIAGNOSIS — I4821 Permanent atrial fibrillation: Secondary | ICD-10-CM

## 2019-05-26 DIAGNOSIS — I429 Cardiomyopathy, unspecified: Secondary | ICD-10-CM | POA: Diagnosis not present

## 2019-05-26 DIAGNOSIS — I4891 Unspecified atrial fibrillation: Secondary | ICD-10-CM

## 2019-05-26 NOTE — Progress Notes (Signed)
Electrophysiology Office Note   Date:  05/26/2019   ID:  [name], DOB [birth year], MRN [id number]  PCP:  Abner Greenspan, MD  Cardiologist:   Primary Electrophysiologist:  Virl Axe, MD    No chief complaint on file.    History of Present Illness: [name] is a 86 y.o. female seen in followup electrophysiology evaluation for atrial fibrillation.    She has hx of atrial flutter ablation related to prior atriotomy undertaken at [facility] in March [id number] complicated by recurrent atrial arrhythmias prompting initiation of Tikosyn  This was stopped fall 2012 Recurrent symptoms prompted [name] to reinitiate Tikosyn.  Shes had recurrent episodes of atrial fibrillation associated with a rapid ventricular response.   She underwent cardioversion and reverted again to atrial fibrillation; it is now permanent She has decided to remain on warfarin  Zio monitor demonstrated a mean heart rate during waking hours over 150 for [name] first 2 days and then at about 75 a second days this presumably coincidental with [name] initiation of metoprolol 25 twice daily.  Increased HR in afib >> dilt increased from 180 qd >>120 bid and prn BB (stopped in [name] long-term because of alopecia) Most recently good control of HR prompting downtitration of her CCB dilt 120 BID>>qd   GXT 12/20 >> poor rate control  HR 118 at rest >> 136 with 1'28" exercise  She has undergone a lot of social change recently.  She has moved.  Her daughter-in-law died suddenly about 07/10/2022.  Devastating. Her son has moved in with her over [name] last few weeks.   She complains of being exceedingly fatigued.  She wakes up in [name] morning feels quite good about 30 minutes later she feels terrible.  There is poor effort tolerance.  Some edema.  Lassitude.  Has struggled with mood.  DATE TEST EF   10/14 Echo   55-60 %   2/17 Echo   50-55 %   9/18 Echo  55-65%       Date Cr TSH Hgb  6/18    13.9  2/19 0.74  9.8  1/20 0.83 4.06(11/19) 12.6  6/20  0.84 5.04 12.1  8/20  3.74     Past Medical History:  Diagnosis Date  . Allergic rhinitis   . 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 Arthritis   . Atrial fibrillation -persistent cardiologist-  dr Samaa Ueda/  primary EP -- dr Tawanna Sat ([facility])   a. s/p PVI [name] 2010;  b. on tikosyn/coumadin;  c. 05/2009 Echo: EF 60-65%, Gr 2 DD. (first dx 09/ 2007)  . Bilateral lower extremity edema   . Bleeding hemorrhoid   . Carotid stenosis    mild (hosp 3/11)- consult by vasc/ Dr Donnetta Hutching  . Complication of anesthesia    hard to wake  . Diverticulosis of colon   . Dyspnea    on exertion-climbing stairs  . Dysrhythmia   . Fatty liver   . H/O cardiac radiofrequency ablation    01/ 2008 at [facility] /  03/ 2010  at [facility]  . Heart failure with preserved ejection fraction ([place])   . Heart murmur    "prior to valve repair"  . History of adenomatous polyp of colon    tubular adenoma's  . History of cardiomyopathy    secondary tachycardia-induced cardiomyopathy -- resolved 2014  . History of squamous cell carcinoma in situ (SCCIS) of skin    05/ 2017  nasal bridge and right medial knee  .  History of transient ischemic attack (TIA)    01-24-2005 and 06-12-2009  . Hyperlipidemia   . Hypothyroidism   . Mild intermittent asthma    reacts to cats  . Mixed stress and urge urinary incontinence   . Pulmonary nodule   . S/P mitral valve repair 10-23-1998  dr Boyce Medici at [facility]   for MVP and regurg. (annuloplasty ring procedure)  . Swelling of left extremity 2017   states it's gotten worse  . Varicose vein of leg   . Wears glasses    Past Surgical History:  Procedure Laterality Date  . APPENDECTOMY  1978  . CARDIAC ELECTROPHYSIOLOGY [place] AND ABLATION  01/ 2008    at [place]   right-sided ablation atrial flutter  . CARDIAC ELECTROPHYSIOLOGY STUDY AND ABLATION  03/ 2010   dr Jaymes Graff at [facility]   AV node ablation and pulmonary vein isolation for atrial fib  .  CARDIOVERSION  06-18-2006;  07-13-2006;  10-19-2010;  10-27-2010  . COLONOSCOPY    . COLONOSCOPY WITH PROPOFOL N/A 10/13/2017   Procedure: COLONOSCOPY WITH PROPOFOL;  Surgeon: [name], MD;  Location: [name] ENDOSCOPY;  Service: Gastroenterology;  Laterality: N/A;  . CYSTO/ TRANSURETHRAL COLLAGEN INJECTION THERAPY  07-26-2007   dr Matilde Sprang  . DILATION AND CURETTAGE OF UTERUS    . ESOPHAGOGASTRODUODENOSCOPY (EGD) WITH PROPOFOL N/A 10/13/2017   Procedure: ESOPHAGOGASTRODUODENOSCOPY (EGD) WITH PROPOFOL;  Surgeon: [name], MD;  Location: [facility] ENDOSCOPY;  Service: Gastroenterology;  Laterality: N/A;  . EXCISIONAL HEMORRHOIDECTOMY  1980s  . GIVENS CAPSULE STUDY N/A 12/08/2017   Procedure: GIVENS CAPSULE STUDY;  Surgeon: [name], MD;  Location: [facility] ENDOSCOPY;  Service: Gastroenterology;  Laterality: N/A;  . HEMORRHOID SURGERY N/A 10/29/2016   Procedure: HEMORRHOIDECTOMY;  Surgeon: Leighton Ruff, MD;  Location[facility];  Service: General;  Laterality: N/A;  . MITRAL VALVE ANNULOPLASTY  10/23/1998   "Model [id number]; Campbell Lerner [id number]"; size 20mm; [facility]; Dr. Boyce Medici  . PILONIDAL CYST EXCISION  1954  . TEE WITH CARDIOVERSION  05-06-2006 at [facility];  01-02-2013 at [facility]  . TOTAL HIP ARTHROPLASTY Left 05/04/2017   Procedure: LEFT TOTAL HIP ARTHROPLASTY ANTERIOR APPROACH;  Surgeon: Mcarthur Rossetti, MD;  Location: [place];  Service: Orthopedics;  Laterality: Left;  . TRANSTHORACIC ECHOCARDIOGRAM  05-01-2015   dr Caryl Comes   ef 50-55%/  mild AV sclerosis without stenosis/  post MV repair with mild central MR (valve area by pressure half-time 2cm^2,  valve area by continutity equation 0.91cm^2, peak grandiant 71mmHg)/  severe LAE/ mild TR/ mild RAE   . TUBAL LIGATION Bilateral 1978     Current Outpatient Medications  Medication Sig Dispense Refill  . albuterol (PROVENTIL HFA;VENTOLIN HFA) 108 (90 Base) MCG/ACT inhaler Inhale 2 puffs into [name] lungs every 4 (four) hours as needed  for wheezing or shortness of breath. 1 Inhaler 0  . alendronate (FOSAMAX) 70 MG tablet Take 1 tablet (70 mg total) by mouth every 7 (seven) days. Take with a full glass of water on an empty stomach. 12 tablet 1  . Cholecalciferol (VITAMIN D3) 2000 units TABS Take 4,000 Units by mouth daily.    . clindamycin (CLEOCIN) 150 MG capsule TAKE 4 CAPSULES BY MOUTH 1 HR PRIOR TO DENTAL APPT  0  . diphenhydrAMINE (BENADRYL) 25 mg capsule Take 50 mg by mouth every 6 (six) hours as needed for itching.    . ferrous sulfate 325 (65 FE) MG EC tablet Take 1 tablet (325 mg total) by mouth daily with  breakfast. 90 tablet 3  . fluticasone (FLONASE) 50 MCG/ACT nasal spray Place 1 spray into both nostrils daily as needed for allergies.    [name] levothyroxine (SYNTHROID) 25 MCG tablet Take 1 tablet (25 mcg total) by mouth daily before breakfast. 90 tablet 3  . loratadine (CLARITIN) 10 MG tablet Take 10 mg by mouth daily as needed for allergies.     [name] omeprazole (PRILOSEC) 40 MG capsule TAKE 1 CAPSULE BY MOUTH EVERY DAY 30 capsule 0  . Polyethyl Glycol-Propyl Glycol (LUBRICANT EYE DROPS) 0.4-0.3 % SOLN Place 1-2 drops into both eyes 3 (three) times daily as needed (for dry eyes.).    [name] warfarin (COUMADIN) 5 MG tablet Take 1 tablet daily except take 1/2 tablet on Monday and Friday or TAKE AS DIRECTED BY ANTICOAGULATION CLINIC  90 day 90 tablet 1  . diltiazem (CARDIZEM CD) 120 MG 24 hr capsule Take 1 capsule (120 mg total) by mouth daily. 90 capsule 3  . metoprolol tartrate (LOPRESSOR) 50 MG tablet Take 1 tablet (50 mg total) by mouth 2 (two) times daily. 180 tablet 3   No current facility-administered medications for this visit.    Allergies:   Amiodarone hcl, Penicillins, and Statins   Social History:  [name]  reports that she has never smoked. She has never used smokeless tobacco. She reports current alcohol use. She reports that she does not use drugs.   Family History:  [name]'s family history includes  Alcohol abuse in her father; Cancer in her father; Lung cancer in her father.    ROS:  Please see [name] history of present illness.  .   All other systems are reviewed and negative.    PHYSICAL EXAM: VS:  BP 116/72   Pulse 76   Ht 5\' 5"  (1.651 m)   Wt 194 lb (88 kg)   SpO2 95%   BMI 32.28 kg/m  , BMI Body mass index is 32.28 kg/m. Well developed and nourished in no acute distress HENT normal Neck supple with JVP-flat Carotids brisk and full without bruits Clear Irregularly irregular rate and rhythm with controlled  ventricular response, no murmurs or gallops Abd-soft with active BS without hepatomegaly No Clubbing cyanosis edema Skin-warm and dry A & Oriented  Grossly normal sensory and motor function   ECG: AF   @70             Intervals  -/09/39  Axis 19           Wt Readings from Last 3 Encounters:  05/26/19 194 lb (88 kg)  01/26/19 187 lb 9.6 oz (85.1 kg)  01/16/19 186 lb (84.4 kg)      Other studies Reviewed: Additional studies/ records that were reviewed today include: labs as noted  Review of [name] above records today demonstrates:    ASSESSMENT AND PLAN: Atrial fibrillation-permanent  Hypothyroidism-treated     Dypsnea on exertion   Anemia   Alopecia with beta-blockers  Obesity    Will undertake serial exclusion to see if drugs are responsible for weakness and lassitude Stop Dilt x 2 weeks then reassess If no better willstop metop tartrate  Weaning 25 bo bid x 5 d   25 qd x 5 d    Then stop x 2 weeks  Reassess   We have also discussed [name] alternative plan of AV ablation and pacing  I then repeated this conversation with her daughter         Signed, Virl Axe, MD  05/26/2019 3:35 PM  [address] [name] [address] [phone] (office) [phone] (fax)

## 2019-05-26 NOTE — Patient Instructions (Addendum)
Your physician has recommended you make the following change in your medication: STOP DILTIAZEM  FOR 2 WEEKS TO SEE IF SYMPTOMS IMPROVE IF NOT WILL DECREASE METOPROLOL TO 1/2 TAB TWICE DAILY FOR 5 DAYS THEN DECREASE TO 1/2 TAB DAILY FOR 5 DAYS THEN STOP    Your physician recommends that you schedule a follow-up appointment in:  [place]

## 2019-05-29 ENCOUNTER — Ambulatory Visit: Payer: Medicare Other | Admitting: Internal Medicine

## 2019-06-08 ENCOUNTER — Ambulatory Visit: Payer: Medicare Other | Admitting: Internal Medicine

## 2019-06-16 ENCOUNTER — Encounter (HOSPITAL_COMMUNITY): Payer: Self-pay | Admitting: Internal Medicine

## 2019-06-16 ENCOUNTER — Other Ambulatory Visit: Payer: Self-pay

## 2019-06-16 ENCOUNTER — Emergency Department (HOSPITAL_COMMUNITY): Payer: Medicare Other

## 2019-06-16 ENCOUNTER — Inpatient Hospital Stay (HOSPITAL_COMMUNITY): Payer: Medicare Other

## 2019-06-16 ENCOUNTER — Inpatient Hospital Stay (HOSPITAL_COMMUNITY)
Admission: EM | Admit: 2019-06-16 | Discharge: 2019-06-20 | DRG: 065 | Disposition: A | Discharge status: Home / Self Care | Payer: Medicare Other | Attending: Internal Medicine | Admitting: Internal Medicine

## 2019-06-16 DIAGNOSIS — R270 Ataxia, unspecified: Secondary | ICD-10-CM | POA: Diagnosis present

## 2019-06-16 DIAGNOSIS — R29702 NIHSS score 2: Secondary | ICD-10-CM | POA: Diagnosis present

## 2019-06-16 DIAGNOSIS — D696 Thrombocytopenia, unspecified: Secondary | ICD-10-CM | POA: Diagnosis present

## 2019-06-16 DIAGNOSIS — R414 Neurologic neglect syndrome: Secondary | ICD-10-CM | POA: Diagnosis present

## 2019-06-16 DIAGNOSIS — I63411 Cerebral infarction due to embolism of right middle cerebral artery: Secondary | ICD-10-CM | POA: Diagnosis present

## 2019-06-16 DIAGNOSIS — I700 Atherosclerosis of aorta: Secondary | ICD-10-CM | POA: Diagnosis present

## 2019-06-16 DIAGNOSIS — I482 Chronic atrial fibrillation, unspecified: Secondary | ICD-10-CM | POA: Diagnosis present

## 2019-06-16 DIAGNOSIS — E669 Obesity, unspecified: Secondary | ICD-10-CM | POA: Diagnosis present

## 2019-06-16 DIAGNOSIS — Z7901 Long term (current) use of anticoagulants: Secondary | ICD-10-CM

## 2019-06-16 DIAGNOSIS — I083 Combined rheumatic disorders of mitral, aortic and tricuspid valves: Secondary | ICD-10-CM | POA: Diagnosis present

## 2019-06-16 DIAGNOSIS — I4821 Permanent atrial fibrillation: Secondary | ICD-10-CM | POA: Diagnosis not present

## 2019-06-16 DIAGNOSIS — Z8673 Personal history of transient ischemic attack (TIA), and cerebral infarction without residual deficits: Secondary | ICD-10-CM

## 2019-06-16 DIAGNOSIS — Z79890 Hormone replacement therapy (postmenopausal): Secondary | ICD-10-CM

## 2019-06-16 DIAGNOSIS — Z801 Family history of malignant neoplasm of trachea, bronchus and lung: Secondary | ICD-10-CM

## 2019-06-16 DIAGNOSIS — Z86010 Personal history of colonic polyps: Secondary | ICD-10-CM

## 2019-06-16 DIAGNOSIS — M8580 Other specified disorders of bone density and structure, unspecified site: Secondary | ICD-10-CM | POA: Diagnosis present

## 2019-06-16 DIAGNOSIS — I110 Hypertensive heart disease with heart failure: Secondary | ICD-10-CM | POA: Diagnosis present

## 2019-06-16 DIAGNOSIS — Z8052 Family history of malignant neoplasm of bladder: Secondary | ICD-10-CM

## 2019-06-16 DIAGNOSIS — E7849 Other hyperlipidemia: Secondary | ICD-10-CM | POA: Diagnosis not present

## 2019-06-16 DIAGNOSIS — I639 Cerebral infarction, unspecified: Secondary | ICD-10-CM | POA: Diagnosis not present

## 2019-06-16 DIAGNOSIS — Z880 Allergy status to penicillin: Secondary | ICD-10-CM

## 2019-06-16 DIAGNOSIS — I6389 Other cerebral infarction: Secondary | ICD-10-CM | POA: Diagnosis not present

## 2019-06-16 DIAGNOSIS — R29818 Other symptoms and signs involving the nervous system: Secondary | ICD-10-CM | POA: Diagnosis not present

## 2019-06-16 DIAGNOSIS — K760 Fatty (change of) liver, not elsewhere classified: Secondary | ICD-10-CM | POA: Diagnosis present

## 2019-06-16 DIAGNOSIS — J4520 Mild intermittent asthma, uncomplicated: Secondary | ICD-10-CM | POA: Diagnosis present

## 2019-06-16 DIAGNOSIS — R791 Abnormal coagulation profile: Secondary | ICD-10-CM | POA: Diagnosis present

## 2019-06-16 DIAGNOSIS — I4891 Unspecified atrial fibrillation: Secondary | ICD-10-CM | POA: Diagnosis not present

## 2019-06-16 DIAGNOSIS — I081 Rheumatic disorders of both mitral and tricuspid valves: Secondary | ICD-10-CM | POA: Diagnosis not present

## 2019-06-16 DIAGNOSIS — Z79899 Other long term (current) drug therapy: Secondary | ICD-10-CM

## 2019-06-16 DIAGNOSIS — Z888 Allergy status to other drugs, medicaments and biological substances status: Secondary | ICD-10-CM

## 2019-06-16 DIAGNOSIS — I340 Nonrheumatic mitral (valve) insufficiency: Secondary | ICD-10-CM | POA: Diagnosis not present

## 2019-06-16 DIAGNOSIS — E785 Hyperlipidemia, unspecified: Secondary | ICD-10-CM | POA: Diagnosis present

## 2019-06-16 DIAGNOSIS — Z6830 Body mass index (BMI) 30.0-30.9, adult: Secondary | ICD-10-CM | POA: Diagnosis not present

## 2019-06-16 DIAGNOSIS — Z7983 Long term (current) use of bisphosphonates: Secondary | ICD-10-CM

## 2019-06-16 DIAGNOSIS — E039 Hypothyroidism, unspecified: Secondary | ICD-10-CM | POA: Diagnosis not present

## 2019-06-16 DIAGNOSIS — Z9049 Acquired absence of other specified parts of digestive tract: Secondary | ICD-10-CM

## 2019-06-16 DIAGNOSIS — Z20822 Contact with and (suspected) exposure to covid-19: Secondary | ICD-10-CM | POA: Diagnosis present

## 2019-06-16 DIAGNOSIS — Z96642 Presence of left artificial hip joint: Secondary | ICD-10-CM | POA: Diagnosis present

## 2019-06-16 DIAGNOSIS — Z86008 Personal history of in-situ neoplasm of other site: Secondary | ICD-10-CM | POA: Diagnosis not present

## 2019-06-16 DIAGNOSIS — Z811 Family history of alcohol abuse and dependence: Secondary | ICD-10-CM

## 2019-06-16 DIAGNOSIS — R202 Paresthesia of skin: Secondary | ICD-10-CM | POA: Diagnosis present

## 2019-06-16 DIAGNOSIS — I5032 Chronic diastolic (congestive) heart failure: Secondary | ICD-10-CM | POA: Diagnosis not present

## 2019-06-16 DIAGNOSIS — I6523 Occlusion and stenosis of bilateral carotid arteries: Secondary | ICD-10-CM | POA: Diagnosis not present

## 2019-06-16 DIAGNOSIS — I5030 Unspecified diastolic (congestive) heart failure: Secondary | ICD-10-CM | POA: Diagnosis present

## 2019-06-16 DIAGNOSIS — I4819 Other persistent atrial fibrillation: Secondary | ICD-10-CM

## 2019-06-16 DIAGNOSIS — I6503 Occlusion and stenosis of bilateral vertebral arteries: Secondary | ICD-10-CM | POA: Diagnosis not present

## 2019-06-16 LAB — URINALYSIS, ROUTINE W REFLEX MICROSCOPIC
Bilirubin Urine: NEGATIVE
Glucose, UA: NEGATIVE mg/dL
Ketones, ur: NEGATIVE mg/dL
Leukocytes,Ua: NEGATIVE
Nitrite: NEGATIVE
Protein, ur: NEGATIVE mg/dL
Specific Gravity, Urine: 1.039 — ABNORMAL HIGH (ref 1.005–1.030)
pH: 5 (ref 5.0–8.0)

## 2019-06-16 LAB — APTT: aPTT: 36 seconds (ref 24–36)

## 2019-06-16 LAB — COMPREHENSIVE METABOLIC PANEL
ALT: 19 U/L (ref 0–44)
AST: 26 U/L (ref 15–41)
Albumin: 3.5 g/dL (ref 3.5–5.0)
Alkaline Phosphatase: 40 U/L (ref 38–126)
Anion gap: 12 (ref 5–15)
BUN: 14 mg/dL (ref 8–23)
CO2: 23 mmol/L (ref 22–32)
Calcium: 9 mg/dL (ref 8.9–10.3)
Chloride: 106 mmol/L (ref 98–111)
Creatinine, Ser: 0.78 mg/dL (ref 0.44–1.00)
GFR calc Af Amer: >60 mL/min (ref >60)
GFR calc non Af Amer: >60 mL/min (ref >60)
Glucose, Bld: 104 mg/dL — ABNORMAL HIGH (ref 70–99)
Potassium: 4.2 mmol/L (ref 3.5–5.1)
Sodium: 141 mmol/L (ref 135–145)
Total Bilirubin: 0.9 mg/dL (ref 0.3–1.2)
Total Protein: 6.8 g/dL (ref 6.5–8.1)

## 2019-06-16 LAB — CBC
HCT: 39 % (ref 36.0–46.0)
Hemoglobin: 12.4 g/dL (ref 12.0–15.0)
MCH: 28.4 pg (ref 26.0–34.0)
MCHC: 31.8 g/dL (ref 30.0–36.0)
MCV: 89.2 fL (ref 80.0–100.0)
Platelets: 147 10*3/uL — ABNORMAL LOW (ref 150–400)
RBC: 4.37 MIL/uL (ref 3.87–5.11)
RDW: 14.6 % (ref 11.5–15.5)
WBC: 4.5 10*3/uL (ref 4.0–10.5)
nRBC: 0 % (ref 0.0–0.2)

## 2019-06-16 LAB — DIFFERENTIAL
Abs Immature Granulocytes: 0 10*3/uL (ref 0.00–0.07)
Basophils Absolute: 0 10*3/uL (ref 0.0–0.1)
Basophils Relative: 1 %
Eosinophils Absolute: 0.2 10*3/uL (ref 0.0–0.5)
Eosinophils Relative: 3 %
Immature Granulocytes: 0 %
Lymphocytes Relative: 23 %
Lymphs Abs: 1 10*3/uL (ref 0.7–4.0)
Monocytes Absolute: 0.4 10*3/uL (ref 0.1–1.0)
Monocytes Relative: 10 %
Neutro Abs: 2.8 10*3/uL (ref 1.7–7.7)
Neutrophils Relative %: 63 %

## 2019-06-16 LAB — PROTIME-INR
INR: 1.9 — ABNORMAL HIGH (ref 0.8–1.2)
Prothrombin Time: 21.7 seconds — ABNORMAL HIGH (ref 11.4–15.2)

## 2019-06-16 LAB — I-STAT CHEM 8, ED
BUN: 15 mg/dL (ref 8–23)
Calcium, Ion: 1.17 mmol/L (ref 1.15–1.40)
Chloride: 106 mmol/L (ref 98–111)
Creatinine, Ser: 0.7 mg/dL (ref 0.44–1.00)
Glucose, Bld: 98 mg/dL (ref 70–99)
HCT: 36 % (ref 36.0–46.0)
Hemoglobin: 12.2 g/dL (ref 12.0–15.0)
Potassium: 4 mmol/L (ref 3.5–5.1)
Sodium: 142 mmol/L (ref 135–145)
TCO2: 28 mmol/L (ref 22–32)

## 2019-06-16 LAB — ECHOCARDIOGRAM COMPLETE
Height: 65 in
Weight: 2912 oz

## 2019-06-16 LAB — RAPID URINE DRUG SCREEN, HOSP PERFORMED
Amphetamines: NOT DETECTED
Barbiturates: NOT DETECTED
Benzodiazepines: NOT DETECTED
Cocaine: NOT DETECTED
Opiates: NOT DETECTED
Tetrahydrocannabinol: NOT DETECTED

## 2019-06-16 LAB — CBG MONITORING, ED: Glucose-Capillary: 94 mg/dL (ref 70–99)

## 2019-06-16 LAB — SARS CORONAVIRUS 2 (TAT 6-24 HRS): SARS Coronavirus 2: NEGATIVE

## 2019-06-16 LAB — ETHANOL: Alcohol, Ethyl (B): <10 mg/dL (ref <10)

## 2019-06-16 LAB — TSH: TSH: 3.817 u[IU]/mL (ref 0.350–4.500)

## 2019-06-16 MED ORDER — ASPIRIN 300 MG RE SUPP: 300.0000 mg | Freq: Every day | RECTAL | Status: DC

## 2019-06-16 MED ORDER — POLYVINYL ALCOHOL 1.4 % OP SOLN
1.0000 [drp] | Freq: Three times a day (TID) | OPHTHALMIC | Status: DC | PRN
  Filled 2019-06-16: qty 15

## 2019-06-16 MED ORDER — ACETAMINOPHEN 160 MG/5ML PO SOLN: 650.0000 mg | ORAL | Status: DC | PRN

## 2019-06-16 MED ORDER — ALBUTEROL SULFATE (2.5 MG/3ML) 0.083% IN NEBU: 2.5000 mg | INHALATION_SOLUTION | RESPIRATORY_TRACT | Status: DC | PRN

## 2019-06-16 MED ORDER — STROKE: EARLY STAGES OF RECOVERY BOOK
Freq: Once | Status: AC
  Filled 2019-06-16: qty 1

## 2019-06-16 MED ORDER — ACETAMINOPHEN 325 MG PO TABS
650.0000 mg | ORAL_TABLET | ORAL | Status: DC | PRN
  Administered 2019-06-17: 650 mg via ORAL
  Filled 2019-06-16: qty 2

## 2019-06-16 MED ORDER — LEVOTHYROXINE SODIUM 25 MCG PO TABS
25.0000 ug | ORAL_TABLET | Freq: Every day | ORAL | Status: DC
  Administered 2019-06-17 – 2019-06-20 (×4): 25 ug via ORAL
  Filled 2019-06-16 (×4): qty 1

## 2019-06-16 MED ORDER — SENNOSIDES-DOCUSATE SODIUM 8.6-50 MG PO TABS: 1.0000 | ORAL_TABLET | Freq: Every evening | ORAL | Status: DC | PRN

## 2019-06-16 MED ORDER — DIPHENHYDRAMINE HCL 25 MG PO CAPS: 50.0000 mg | ORAL_CAPSULE | Freq: Four times a day (QID) | ORAL | Status: DC | PRN

## 2019-06-16 MED ORDER — DILTIAZEM HCL 25 MG/5ML IV SOLN
10.0000 mg | Freq: Once | INTRAVENOUS | Status: AC
  Administered 2019-06-17: 10 mg via INTRAVENOUS
  Filled 2019-06-16: qty 5

## 2019-06-16 MED ORDER — ASPIRIN 325 MG PO TABS
325.0000 mg | ORAL_TABLET | Freq: Every day | ORAL | Status: DC
  Administered 2019-06-16 – 2019-06-18 (×3): 325 mg via ORAL
  Filled 2019-06-16 (×3): qty 1

## 2019-06-16 MED ORDER — ENOXAPARIN SODIUM 40 MG/0.4ML ~~LOC~~ SOLN: 40.0000 mg | SUBCUTANEOUS | Status: DC

## 2019-06-16 MED ORDER — ACETAMINOPHEN 650 MG RE SUPP: 650.0000 mg | RECTAL | Status: DC | PRN

## 2019-06-16 MED ORDER — SODIUM CHLORIDE 0.9 % IV SOLN: INTRAVENOUS | Status: DC

## 2019-06-16 MED ORDER — IOHEXOL 350 MG/ML SOLN
100.0000 mL | Freq: Once | INTRAVENOUS | Status: AC | PRN
  Administered 2019-06-16: 100 mL via INTRAVENOUS

## 2019-06-16 MED ORDER — ATORVASTATIN CALCIUM 40 MG PO TABS
40.0000 mg | ORAL_TABLET | Freq: Every day | ORAL | Status: DC
  Administered 2019-06-16: 40 mg via ORAL
  Filled 2019-06-16: qty 1

## 2019-06-16 NOTE — H&P (Addendum)
History and Physical    [name] [id number] DOB: 10/16/1932 DOA: 06/16/2019  PCP: Abner Greenspan, MD Consultants:  Caryl Comes - cardiology Patient coming from:  Home - lives with her son; NOK: Daughter, [name], [phone]  Chief Complaint: Possible CVA  HPI: [name] is a 86 y.o. female with medical history significant of s/p MVR; hypothyroidism; HLD; TIA x 2; chronic diastolic CHF; afib on Coumadin; and mild carotid stenosis (2011) presenting with concern for CVA.  She reports that she awoke about 0500 and touched her face with her left hand "but it didn't feel like my hand."  She was reading but would intermittently notice her left hand "like it was a surprise, not even part of my body."  It is not numb or weak.  "It's like my brain is surprised to see it flash into view."  It is still there but somewhat better.  She is left handed.  She also noticed that she was very malodorous, "very hormonal, all this body odor."  No dysphagia or dysarthria.    She and Dr. Caryl Comes have been considering a pacemaker due to fatigue.    ED Course:  L-sided neglect this AM - "left arm does not belong to her."  Called code stroke, but not in window for intervention, on Coumadin.  Needs admission for stroke evaluation.  Perfusion study shows stroke in the R parietal region (preliminary).  Review of Systems: As per HPI; otherwise review of systems reviewed and negative.   Ambulatory Status:  Ambulates without assistance  Past Medical History:  Diagnosis Date  . Allergic rhinitis   . 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 Arthritis   . Atrial fibrillation -persistent cardiologist-  dr klein/  primary EP -- dr Tawanna Sat ([facility])   a. s/p PVI [name] 2010;  b. on tikosyn/coumadin;  c. 05/2009 Echo: EF 60-65%, Gr 2 DD. (first dx 09/ 2007)  . Bilateral lower extremity edema   . Bleeding hemorrhoid   . Carotid stenosis    mild (hosp 3/11)- consult by vasc/ Dr Donnetta Hutching  . Complication of anesthesia    hard  to wake  . Diverticulosis of colon   . Dyspnea    on exertion-climbing stairs  . Fatty liver   . H/O cardiac radiofrequency ablation    01/ 2008 at [facility] /  03/ 2010  at [facility]  . Heart failure with preserved ejection fraction ([place])   . History of adenomatous polyp of colon    tubular adenoma's  . History of cardiomyopathy    secondary tachycardia-induced cardiomyopathy -- resolved 2014  . History of squamous cell carcinoma in situ (SCCIS) of skin    05/ 2017  nasal bridge and right medial knee  . History of transient ischemic attack (TIA)    01-24-2005 and 06-12-2009  . Hyperlipidemia   . Hypothyroidism   . Mild intermittent asthma    reacts to cats  . Mixed stress and urge urinary incontinence   . Pulmonary nodule   . S/P mitral valve repair 10-23-1998  dr Boyce Medici at [facility]   for MVP and regurg. (annuloplasty ring procedure)    Past Surgical History:  Procedure Laterality Date  . APPENDECTOMY  1978  . CARDIAC ELECTROPHYSIOLOGY [place] AND ABLATION  01/ 2008    at [facility]   right-sided ablation atrial flutter  . CARDIAC ELECTROPHYSIOLOGY STUDY AND ABLATION  03/ 2010   dr Jaymes Graff at [facility] node ablation and  pulmonary vein isolation for atrial fib  . CARDIOVERSION  06-18-2006;  07-13-2006;  10-19-2010;  10-27-2010  . COLONOSCOPY    . COLONOSCOPY WITH PROPOFOL N/A 10/13/2017   Procedure: COLONOSCOPY WITH PROPOFOL;  Surgeon: [name], MD;  Location: [facility] ENDOSCOPY;  Service: Gastroenterology;  Laterality: N/A;  . CYSTO/ TRANSURETHRAL COLLAGEN INJECTION THERAPY  07-26-2007   dr Matilde Sprang  . DILATION AND CURETTAGE OF UTERUS    . ESOPHAGOGASTRODUODENOSCOPY (EGD) WITH PROPOFOL N/A 10/13/2017   Procedure: ESOPHAGOGASTRODUODENOSCOPY (EGD) WITH PROPOFOL;  Surgeon: [name], MD;  Location: [facility] ENDOSCOPY;  Service: Gastroenterology;  Laterality: N/A;  . EXCISIONAL HEMORRHOIDECTOMY  1980s  . GIVENS CAPSULE STUDY N/A 12/08/2017    Procedure: GIVENS CAPSULE STUDY;  Surgeon: [name], MD;  Location: [facility] ENDOSCOPY;  Service: Gastroenterology;  Laterality: N/A;  . HEMORRHOID SURGERY N/A 10/29/2016   Procedure: HEMORRHOIDECTOMY;  Surgeon: Leighton Ruff, MD;  Location: [facility];  Service: General;  Laterality: N/A;  . MITRAL VALVE ANNULOPLASTY  10/23/1998   "Model [id number]; Campbell Lerner [id number]"; size 19mm; [facility]; Dr. Boyce Medici  . PILONIDAL CYST EXCISION  1954  . TEE WITH CARDIOVERSION  05-06-2006 at [facility];  01-02-2013 at [facility]  . TOTAL HIP ARTHROPLASTY Left 05/04/2017   Procedure: LEFT TOTAL HIP ARTHROPLASTY ANTERIOR APPROACH;  Surgeon: Mcarthur Rossetti, MD;  Location: [facility];  Service: Orthopedics;  Laterality: Left;  . TRANSTHORACIC ECHOCARDIOGRAM  05-01-2015   dr Caryl Comes   ef 50-55%/  mild AV sclerosis without stenosis/  post MV repair with mild central MR (valve area by pressure half-time 2cm^2,  valve area by continutity equation 0.91cm^2, peak grandiant 42mmHg)/  severe LAE/ mild TR/ mild RAE   . TUBAL LIGATION Bilateral 1978    Social History   Socioeconomic History  . Marital status: Widowed    Spouse name: Not on file  . Number of children: 6  . Years of education: Not on file  . Highest education level: Not on file  Occupational History  . Occupation: Architectural technologist: RETIRED  Tobacco Use  . Smoking status: Never Smoker  . Smokeless tobacco: Never Used  Substance and Sexual Activity  . Alcohol use: Yes    Alcohol/week: 0.0 standard drinks    Comment: seldom  . Drug use: No  . Sexual activity: Not Currently  Other Topics Concern  . Not on file  Social History Narrative   Retired. Daily Caffeine use: 2 daily    Social Determinants of Health   Financial Resource Strain:   . Difficulty of Paying Living Expenses:   Food Insecurity:   . Worried About Charity fundraiser in the Last Year:   . Arboriculturist in the Last Year:   Transportation Needs:   . Lexicographer (Medical):   [name] Lack of Transportation (Non-Medical):   Physical Activity:   . Days of Exercise per Week:   . Minutes of Exercise per Session:   Stress:   . Feeling of Stress :   Social Connections:   . Frequency of Communication with Friends and Family:   . Frequency of Social Gatherings with Friends and Family:   . Attends Religious Services:   . Active Member of Clubs or Organizations:   . Attends Archivist Meetings:   [name] Marital Status:   Intimate Partner Violence:   . Fear of Current or Ex-Partner:   . Emotionally Abused:   [name] Physically Abused:   . Sexually Abused:  Allergies  Allergen Reactions  . Amiodarone Hcl Swelling    SWELLING REACTION UNSPECIFIED   . Penicillins Rash    Has patient had a PCN reaction causing immediate rash, facial/tongue/throat swelling, SOB or lightheadedness with hypotension: No Has patient had a PCN reaction causing severe rash involving mucus membranes or skin necrosis: No Has patient had a PCN reaction that required hospitalization:Patient was inpatient when reaction occurred Has patient had a PCN reaction occurring within the last 10 years: No If all of the above answers are "NO", then may proceed with Cephalosporin use.   . Statins Rash    Family History  Problem Relation Age of Onset  . Lung cancer Father        smoker, died at 71  . Alcohol abuse Father   . Cancer Father        bladder and lung CA smoker  . Breast cancer Neg Hx   . Stroke Neg Hx     Prior to Admission medications   Medication Sig Start Date End Date Taking? Authorizing Provider  albuterol (PROVENTIL HFA;VENTOLIN HFA) 108 (90 Base) MCG/ACT inhaler Inhale 2 puffs into the lungs every 4 (four) hours as needed for wheezing or shortness of breath. 06/23/16  Yes Tower, Wynelle Fanny, MD  alendronate (FOSAMAX) 70 MG tablet Take 1 tablet (70 mg total) by mouth every 7 (seven) days. Take with a full glass of water on an empty stomach. 09/27/18  Yes Tower,  Wynelle Fanny, MD  diphenhydrAMINE (BENADRYL) 25 mg capsule Take 50 mg by mouth every 6 (six) hours as needed for itching.   Yes [provider]  ferrous sulfate 325 (65 FE) MG EC tablet Take 1 tablet (325 mg total) by mouth daily with breakfast. 11/29/18  Yes Tower, Wynelle Fanny, MD  fluticasone (FLONASE) 50 MCG/ACT nasal spray Place 1 spray into both nostrils daily as needed for allergies.   Yes [provider]  levothyroxine (SYNTHROID) 25 MCG tablet Take 1 tablet (25 mcg total) by mouth daily before breakfast. 11/29/18  Yes Tower, Wynelle Fanny, MD  loratadine (CLARITIN) 10 MG tablet Take 10 mg by mouth daily as needed for allergies.    Yes [provider]  metoprolol tartrate (LOPRESSOR) 50 MG tablet Take 1 tablet (50 mg total) by mouth 2 (two) times daily. Patient taking differently: Take 25 mg by mouth 2 (two) times daily.  02/23/19 06/16/19 Yes Deboraha Sprang, MD  Polyethyl Glycol-Propyl Glycol (LUBRICANT EYE DROPS) 0.4-0.3 % SOLN Place 1-2 drops into both eyes 3 (three) times daily as needed (for dry eyes.).   Yes [provider]  warfarin (COUMADIN) 5 MG tablet Take 1 tablet daily except take 1/2 tablet on Monday and Friday or TAKE AS DIRECTED BY ANTICOAGULATION CLINIC  90 day 10/11/18  Yes Tower, Marne A, MD  clindamycin (CLEOCIN) 150 MG capsule Take 600 mg by mouth as directed.  09/06/17   [provider]  diltiazem (CARDIZEM CD) 120 MG 24 hr capsule Take 1 capsule (120 mg total) by mouth daily. 01/13/19 04/13/19  Deboraha Sprang, MD  omeprazole (PRILOSEC) 40 MG capsule TAKE 1 CAPSULE BY MOUTH EVERY DAY Patient not taking: Reported on 06/16/2019 04/18/19   [name], MD    Physical Exam: Vitals:   06/16/19 1315 06/16/19 1330 06/16/19 1345 06/16/19 1400  BP:      Pulse: (!) 43 96 93 (!) 43  Resp: (!) 21 18 17  (!) 26  Temp:      TempSrc:  SpO2: 99% 98% 99% 99%  Weight:      Height:         . General:  Appears calm and comfortable and is NAD; very  conversant . Eyes:  PERRL, EOMI, normal lids, iris . ENT:  grossly normal hearing, lips & tongue, mmm; appropriate dentition . Neck:  no LAD, masses or thyromegaly; no carotid bruits . Cardiovascular:  RRR, no m/r/g. No LE edema.  [name] Respiratory:   CTA bilaterally with no wheezes/rales/rhonchi.  Normal respiratory effort. . Abdomen:  soft, NT, ND, NABS . Skin:  no rash or induration seen on limited exam . Musculoskeletal:  grossly normal tone BUE/BLE, good ROM, no bony abnormality . Psychiatric:  Mildly anxiousmood and affect, speech fluent and appropriate, AOx3 . Neurologic:  CN 2-12 grossly intact, moves all extremities in coordinated fashion, sensation intact    Radiological Exams on Admission: CT Code Stroke CTA Head W/WO contrast  Result Date: 06/16/2019 CLINICAL DATA:  Focal neuro deficit, greater than 6 hours, stroke suspected. EXAM: CT ANGIOGRAPHY HEAD AND NECK CT PERFUSION BRAIN TECHNIQUE: Multidetector CT imaging of the head and neck was performed using the standard protocol during bolus administration of intravenous contrast. Multiplanar CT image reconstructions and MIPs were obtained to evaluate the vascular anatomy. Carotid stenosis measurements (when applicable) are obtained utilizing NASCET criteria, using the distal internal carotid diameter as the denominator. Multiphase CT imaging of the brain was performed following IV bolus contrast injection. Subsequent parametric perfusion maps were calculated using RAPID software. CONTRAST:  Administered contrast not known at this time. COMPARISON:  Concurrently performed noncontrast head CT 06/16/2019, CTA head/neck 06/13/2009, MRA neck 03/29/2006 FINDINGS: CTA NECK FINDINGS Aortic arch: Standard aortic branching. Atherosclerotic plaque within the visualized aortic arch and proximal major branch vessels of the neck. No significant innominate or proximal subclavian artery stenosis. Right carotid system: CCA and ICA patent within the neck  without stenosis. Mild calcified plaque within the proximal ICA. Redemonstrated tortuosity of the ICA. Left carotid system: CCA and ICA patent within the neck without stenosis. Mild calcified plaque within the proximal ICA. Vertebral arteries: The vertebral arteries are codominant and patent within the neck. Mixed plaque at the origin of the left vertebral artery results in mild ostial stenosis. Skeleton: No acute bony abnormality or aggressive osseous lesion. C5-C6 spondylosis with moderate disc height loss, posterior disc osteophyte and uncovertebral hypertrophy. Other neck: No neck mass or cervical lymphadenopathy. Upper chest: No consolidation within the imaged lung apices. Mild interlobular septal thickening within the visualized lung apices is nonspecific, but may reflect mild edema. Prior median sternotomy. Redemonstrated calcified mediastinal lymph nodes. Review of the MIP images confirms the above findings CTA HEAD FINDINGS Anterior circulation: The intracranial internal carotid arteries are patent without significant stenosis. The M1 middle cerebral arteries are patent without significant stenosis. Question occlusion of a mid to distal right M2 MCA branch vessel (see annotations on series 9, images 67 through 71) (series 12, image 15). The anterior cerebral arteries are patent without significant proximal stenosis. No intracranial aneurysm is identified. Posterior circulation: The intracranial vertebral arteries are patent. Mild atherosclerotic narrowing of the V4 right vertebral artery beyond the origin of the right PICA. The basilar artery is patent without significant stenosis. The posterior cerebral arteries are patent without significant proximal stenosis. Atherosclerotic irregularity of the distal right posterior cerebral arteries bilaterally. There is the left P1 segment is hypoplastic. There is a sizable left posterior communicating artery. No definite right posterior communicating artery is  identified. Venous  sinuses: Within limitations of contrast timing, no convincing thrombus. Anatomic variants: As described Review of the MIP images confirms the above findings CT Brain Perfusion Findings: ASPECTS: 10 CBF (<30%) Volume: 11mL Perfusion (Tmax>6.0s) volume: 4mL (within the right parietal lobe) Mismatch Volume: 65mL Infarction Location:None identified These results were called by telephone at the time of interpretation on 06/16/2019 at 9:52 am to provider Dr. Rory Percy, who verbally acknowledged these results. IMPRESSION: CTA neck: 1. The bilateral common and internal carotid arteries are patent within the neck without significant stenosis. Mild calcified plaque within the proximal internal carotid arteries. 2. The bilateral vertebral arteries are patent within the neck. Mixed plaque results in mild stenosis at the origin of the left vertebral artery. CTA head: 1. Possible occlusion of a mid to distal right M2 MCA branch vessel as described. 2. Mild atherosclerotic narrowing of the V4 right vertebral artery. 3. Atherosclerotic irregularity of the posterior cerebral arteries bilaterally. CT perfusion head: The perfusion software identifies a 12 mL region of critically hypoperfused parenchyma within the right parietal lobe utilizing the Tmax>6 seconds threshold. No core infarct is identified by the perfusion software. Reported mismatch volume: 12 mL. Electronically Signed   By: Kellie Simmering DO   On: 06/16/2019 10:10   CT Code Stroke CTA Neck W/WO contrast  Result Date: 06/16/2019 CLINICAL DATA:  Focal neuro deficit, greater than 6 hours, stroke suspected. EXAM: CT ANGIOGRAPHY HEAD AND NECK CT PERFUSION BRAIN TECHNIQUE: Multidetector CT imaging of the head and neck was performed using the standard protocol during bolus administration of intravenous contrast. Multiplanar CT image reconstructions and MIPs were obtained to evaluate the vascular anatomy. Carotid stenosis measurements (when applicable) are  obtained utilizing NASCET criteria, using the distal internal carotid diameter as the denominator. Multiphase CT imaging of the brain was performed following IV bolus contrast injection. Subsequent parametric perfusion maps were calculated using RAPID software. CONTRAST:  Administered contrast not known at this time. COMPARISON:  Concurrently performed noncontrast head CT 06/16/2019, CTA head/neck 06/13/2009, MRA neck 03/29/2006 FINDINGS: CTA NECK FINDINGS Aortic arch: Standard aortic branching. Atherosclerotic plaque within the visualized aortic arch and proximal major branch vessels of the neck. No significant innominate or proximal subclavian artery stenosis. Right carotid system: CCA and ICA patent within the neck without stenosis. Mild calcified plaque within the proximal ICA. Redemonstrated tortuosity of the ICA. Left carotid system: CCA and ICA patent within the neck without stenosis. Mild calcified plaque within the proximal ICA. Vertebral arteries: The vertebral arteries are codominant and patent within the neck. Mixed plaque at the origin of the left vertebral artery results in mild ostial stenosis. Skeleton: No acute bony abnormality or aggressive osseous lesion. C5-C6 spondylosis with moderate disc height loss, posterior disc osteophyte and uncovertebral hypertrophy. Other neck: No neck mass or cervical lymphadenopathy. Upper chest: No consolidation within the imaged lung apices. Mild interlobular septal thickening within the visualized lung apices is nonspecific, but may reflect mild edema. Prior median sternotomy. Redemonstrated calcified mediastinal lymph nodes. Review of the MIP images confirms the above findings CTA HEAD FINDINGS Anterior circulation: The intracranial internal carotid arteries are patent without significant stenosis. The M1 middle cerebral arteries are patent without significant stenosis. Question occlusion of a mid to distal right M2 MCA branch vessel (see annotations on series 9,  images 67 through 71) (series 12, image 15). The anterior cerebral arteries are patent without significant proximal stenosis. No intracranial aneurysm is identified. Posterior circulation: The intracranial vertebral arteries are patent. Mild atherosclerotic narrowing of the V4  right vertebral artery beyond the origin of the right PICA. The basilar artery is patent without significant stenosis. The posterior cerebral arteries are patent without significant proximal stenosis. Atherosclerotic irregularity of the distal right posterior cerebral arteries bilaterally. There is the left P1 segment is hypoplastic. There is a sizable left posterior communicating artery. No definite right posterior communicating artery is identified. Venous sinuses: Within limitations of contrast timing, no convincing thrombus. Anatomic variants: As described Review of the MIP images confirms the above findings CT Brain Perfusion Findings: ASPECTS: 10 CBF (<30%) Volume: 53mL Perfusion (Tmax>6.0s) volume: 57mL (within the right parietal lobe) Mismatch Volume: 31mL Infarction Location:None identified These results were called by telephone at the time of interpretation on 06/16/2019 at 9:52 am to provider Dr. Rory Percy, who verbally acknowledged these results. IMPRESSION: CTA neck: 1. The bilateral common and internal carotid arteries are patent within the neck without significant stenosis. Mild calcified plaque within the proximal internal carotid arteries. 2. The bilateral vertebral arteries are patent within the neck. Mixed plaque results in mild stenosis at the origin of the left vertebral artery. CTA head: 1. Possible occlusion of a mid to distal right M2 MCA branch vessel as described. 2. Mild atherosclerotic narrowing of the V4 right vertebral artery. 3. Atherosclerotic irregularity of the posterior cerebral arteries bilaterally. CT perfusion head: The perfusion software identifies a 12 mL region of critically hypoperfused parenchyma within the  right parietal lobe utilizing the Tmax>6 seconds threshold. No core infarct is identified by the perfusion software. Reported mismatch volume: 12 mL. Electronically Signed   By: Kellie Simmering DO   On: 06/16/2019 10:10   CT Code Stroke Cerebral Perfusion with contrast  Result Date: 06/16/2019 CLINICAL DATA:  Focal neuro deficit, greater than 6 hours, stroke suspected. EXAM: CT ANGIOGRAPHY HEAD AND NECK CT PERFUSION BRAIN TECHNIQUE: Multidetector CT imaging of the head and neck was performed using the standard protocol during bolus administration of intravenous contrast. Multiplanar CT image reconstructions and MIPs were obtained to evaluate the vascular anatomy. Carotid stenosis measurements (when applicable) are obtained utilizing NASCET criteria, using the distal internal carotid diameter as the denominator. Multiphase CT imaging of the brain was performed following IV bolus contrast injection. Subsequent parametric perfusion maps were calculated using RAPID software. CONTRAST:  Administered contrast not known at this time. COMPARISON:  Concurrently performed noncontrast head CT 06/16/2019, CTA head/neck 06/13/2009, MRA neck 03/29/2006 FINDINGS: CTA NECK FINDINGS Aortic arch: Standard aortic branching. Atherosclerotic plaque within the visualized aortic arch and proximal major branch vessels of the neck. No significant innominate or proximal subclavian artery stenosis. Right carotid system: CCA and ICA patent within the neck without stenosis. Mild calcified plaque within the proximal ICA. Redemonstrated tortuosity of the ICA. Left carotid system: CCA and ICA patent within the neck without stenosis. Mild calcified plaque within the proximal ICA. Vertebral arteries: The vertebral arteries are codominant and patent within the neck. Mixed plaque at the origin of the left vertebral artery results in mild ostial stenosis. Skeleton: No acute bony abnormality or aggressive osseous lesion. C5-C6 spondylosis with moderate  disc height loss, posterior disc osteophyte and uncovertebral hypertrophy. Other neck: No neck mass or cervical lymphadenopathy. Upper chest: No consolidation within the imaged lung apices. Mild interlobular septal thickening within the visualized lung apices is nonspecific, but may reflect mild edema. Prior median sternotomy. Redemonstrated calcified mediastinal lymph nodes. Review of the MIP images confirms the above findings CTA HEAD FINDINGS Anterior circulation: The intracranial internal carotid arteries are patent without significant stenosis. The  M1 middle cerebral arteries are patent without significant stenosis. Question occlusion of a mid to distal right M2 MCA branch vessel (see annotations on series 9, images 67 through 71) (series 12, image 15). The anterior cerebral arteries are patent without significant proximal stenosis. No intracranial aneurysm is identified. Posterior circulation: The intracranial vertebral arteries are patent. Mild atherosclerotic narrowing of the V4 right vertebral artery beyond the origin of the right PICA. The basilar artery is patent without significant stenosis. The posterior cerebral arteries are patent without significant proximal stenosis. Atherosclerotic irregularity of the distal right posterior cerebral arteries bilaterally. There is the left P1 segment is hypoplastic. There is a sizable left posterior communicating artery. No definite right posterior communicating artery is identified. Venous sinuses: Within limitations of contrast timing, no convincing thrombus. Anatomic variants: As described Review of the MIP images confirms the above findings CT Brain Perfusion Findings: ASPECTS: 10 CBF (<30%) Volume: 34mL Perfusion (Tmax>6.0s) volume: 72mL (within the right parietal lobe) Mismatch Volume: 63mL Infarction Location:None identified These results were called by telephone at the time of interpretation on 06/16/2019 at 9:52 am to provider Dr. Rory Percy, who verbally  acknowledged these results. IMPRESSION: CTA neck: 1. The bilateral common and internal carotid arteries are patent within the neck without significant stenosis. Mild calcified plaque within the proximal internal carotid arteries. 2. The bilateral vertebral arteries are patent within the neck. Mixed plaque results in mild stenosis at the origin of the left vertebral artery. CTA head: 1. Possible occlusion of a mid to distal right M2 MCA branch vessel as described. 2. Mild atherosclerotic narrowing of the V4 right vertebral artery. 3. Atherosclerotic irregularity of the posterior cerebral arteries bilaterally. CT perfusion head: The perfusion software identifies a 12 mL region of critically hypoperfused parenchyma within the right parietal lobe utilizing the Tmax>6 seconds threshold. No core infarct is identified by the perfusion software. Reported mismatch volume: 12 mL. Electronically Signed   By: Kellie Simmering DO   On: 06/16/2019 10:10   CT HEAD CODE STROKE WO CONTRAST  Result Date: 06/16/2019 CLINICAL DATA:  Code stroke. Neuro deficit, acute, stroke suspected. Additional history provided: Last known normal 5 a.m., patient awoke and felt as if her left hand did not along to her. EXAM: CT HEAD WITHOUT CONTRAST TECHNIQUE: Contiguous axial images were obtained from the base of the skull through the vertex without intravenous contrast. COMPARISON:  Brain MRI 11/21/2018, head CT 04/12/2018 FINDINGS: Brain: There is no evidence of acute intracranial hemorrhage, intracranial mass, midline shift or extra-axial fluid collection.No demarcated cortical infarction. Mild ill-defined hypoattenuation within the cerebral white matter is nonspecific, but consistent with chronic small vessel ischemic disease and stable from prior exams. Stable, mild generalized parenchymal atrophy. 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 Vascular: No hyperdense vessel.  Atherosclerotic calcifications.  Skull: Normal. Negative for fracture or focal lesion. Sinuses/Orbits: Visualized orbits demonstrate no acute abnormality. Mild ethmoid sinus mucosal thickening. No significant mastoid effusion. These results were communicated to Dr. Rory Percy At 9:37 amon 3/26/2021by text page via the [facility] messaging system. IMPRESSION: 1. No evidence of acute intracranial abnormality. 2. 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 3. Stable generalized parenchymal atrophy and chronic small vessel ischemic disease. 4. Mild ethmoid sinus mucosal thickening. Electronically Signed   By: Kellie Simmering DO   On: 06/16/2019 09:38    EKG: Independently reviewed.  Afib with rate 83; nonspecific ST changes with no evidence of acute ischemia   Labs on  Admission: I have personally reviewed the available labs and imaging studies at the time of the admission.  Pertinent labs:   Glucose 104 Platelets 147 INR 1.9, prior 2.1 on 2/25 ETOH <10 COVID negative   Assessment/Plan Principal Problem:   CVA (cerebral vascular accident) ([place]) Active Problems:   Hypothyroidism   Hyperlipidemia   Atrial fibrillation (HCC)   (HFpEF) heart failure with preserved ejection fraction ([place])   CVA -Patient with prior h/o TIA x 2 presenting with "alien hand syndrome"  -Concerning for TIA/CVA -Will admit for further CVA evaluation -Telemetry monitoring -MRI -Carotid dopplers may need to be performed despite CTA (which is still pending) - slight plaque formation on carotids back in 2008, and I don't see any reports since -Echo -Risk stratification with FLP, A1c; will also check TSH  -ASA daily -Neurology consult -PT/OT/ST/Nutrition Consults  HTN -Allow permissive HTN for now -Treat BP only if >220/120, and then with goal of 15% reduction -Hold Lopressor and plan to restart in 48-72 hours -Of note, she was previously on Diltiazem but recently stopped taking this due to fatigue   HLD -Check  FLP -Will start empiric Lipitor 40 mg daily (reportedly causes a "rash" but need to retry)   Afib on Coumadin -She reports a reluctance to take medications and an intolerance to many -She and Dr. Caryl Comes have been doing a "serial exclusion to see if drugs are responsible for her weakness and lassitude" -She is off Diltiazem (weaned starting 3/5, off since 3/19) and reports feeling better -They have discussed AV ablation and pacemaker placement -She remains on Coumadin, pharmacy to dose -Cardiology consulted   Chronic diastolic CHF -Preserved EF on last echo in 2018 -Echo pending -Appears to be compensated at this time  Hypothyroidism -Check TSH -Continue Synthroid at current dose for now    Note: This patient has been tested and is negative for the novel coronavirus COVID-19.      DVT prophylaxis:  Lovenox  Code Status: Full - confirmed with patient Family Communication: None present; she reported that her son had already spoken with her daughter at the time of admission Disposition Plan:  Home once clinically improved Consults called: Neurology; PT/OT/ST/Nutrition  Admission status: Admit - It is my clinical opinion that admission to INPATIENT is reasonable and necessary because of the expectation that this patient will require hospital care that crosses at least 2 midnights to treat this condition based on the medical complexity of the problems presented.  Given the aforementioned information, the predictability of an adverse outcome is felt to be significant.    Karmen Bongo MD [facility]   How to contact the [facility] Attending or Consulting provider [place] or covering provider during after hours [place], for this patient?  1. Check the care team in [facility] and look for a) attending/consulting TRH provider listed and b) the [facility] team listed 2. Log into [email or web address] and use [place] universal password to access. If you do not have the password, please contact the  hospital operator. 3. Locate the [facility] provider you are looking for under [facility] and page to a number that you can be directly reached. 4. If you still have difficulty reaching the provider, please page the [facility] (Director on Call) for the Hospitalists listed on amion for assistance.   06/16/2019, 3:11 PM

## 2019-06-16 NOTE — Progress Notes (Signed)
CARDIOLOGY CONSULT NOTE  Patient ID: [name] MRN: [id number] DOB/AGE: [birth year] 86 y.o.  Admit date: 06/16/2019 Primary Physician [place], Wynelle Fanny, MD Primary Cardiologist  No primary care provider on file.  (Dr. Caryl Comes EP)  Chief Complaint  CVA Requesting  Dr Lorin Mercy.   HPI:  The patient has a history of atrial fib and MVR.  She had symptoms consistent with a stroke with hemineglect.    She had a head CT with possible M3 right distal occlusion.    MRI with right posterior MCA infarct.  She was outside the tPA window.  She sees Dr. Caryl Comes for a complicated history of fib/flutter.  She had flutter ablation at [facility] but had recurrent atrial arrhythmias and had Tikosyn started and has had DCCV but had recurrent fib which is now permanent.  She has been on Cardizem for rate control but recently Dr. Caryl Comes was trying her off of Dilt because of profound fatigue and had plans to wean beta blocker and if necessary consider AV ablation and pacing.  She reports today that she knew a hand was touching her face.  She just did not realize that it was hers.  On presentation her INR was subtherapeutic at 1.9.  She had otherwise been doing OK.  She had been less fatigued coming off the diltiazem.  She has not noticed increased heart rate.  The patient denies any new symptoms such as chest discomfort, neck or arm discomfort. There has been no new shortness of breath, PND or orthopnea. There have been no reported palpitations, presyncope or syncope.     Past Medical History:  Diagnosis Date  . Allergic rhinitis   . 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 Arthritis   . Atrial fibrillation -persistent cardiologist-  dr klein/  primary EP -- dr Tawanna Sat ([facility])   a. s/p PVI [name] 2010;  b. on tikosyn/coumadin;  c. 05/2009 Echo: EF 60-65%, Gr 2 DD. (first dx 09/ 2007)  . Bilateral lower extremity edema   . Bleeding hemorrhoid   . Carotid stenosis    mild (hosp 3/11)- consult by vasc/ Dr Donnetta Hutching  . Complication of  anesthesia    hard to wake  . Diverticulosis of colon   . Dyspnea    on exertion-climbing stairs  . Fatty liver   . H/O cardiac radiofrequency ablation    01/ 2008 at [facility] /  03/ 2010  at [facility]  . Heart failure with preserved ejection fraction ([place])   . History of adenomatous polyp of colon    tubular adenoma's  . History of cardiomyopathy    secondary tachycardia-induced cardiomyopathy -- resolved 2014  . History of squamous cell carcinoma in situ (SCCIS) of skin    05/ 2017  nasal bridge and right medial knee  . History of transient ischemic attack (TIA)    01-24-2005 and 06-12-2009  . Hyperlipidemia   . Hypothyroidism   . Mild intermittent asthma    reacts to cats  . Mixed stress and urge urinary incontinence   . Pulmonary nodule   . S/P mitral valve repair 10-23-1998  dr Boyce Medici at [facility]   for MVP and regurg. (annuloplasty ring procedure)    Past Surgical History:  Procedure Laterality Date  . APPENDECTOMY  1978  . CARDIAC ELECTROPHYSIOLOGY [place] AND ABLATION  01/ 2008    at [place]   right-sided ablation atrial flutter  . CARDIAC ELECTROPHYSIOLOGY STUDY AND ABLATION  03/ 2010   dr  bahason at [facility]   AV node ablation and pulmonary vein isolation for atrial fib  . CARDIOVERSION  06-18-2006;  07-13-2006;  10-19-2010;  10-27-2010  . COLONOSCOPY    . COLONOSCOPY WITH PROPOFOL N/A 10/13/2017   Procedure: COLONOSCOPY WITH PROPOFOL;  Surgeon: [name], MD;  Location: [facility] ENDOSCOPY;  Service: Gastroenterology;  Laterality: N/A;  . CYSTO/ TRANSURETHRAL COLLAGEN INJECTION THERAPY  07-26-2007   dr Matilde Sprang  . DILATION AND CURETTAGE OF UTERUS    . ESOPHAGOGASTRODUODENOSCOPY (EGD) WITH PROPOFOL N/A 10/13/2017   Procedure: ESOPHAGOGASTRODUODENOSCOPY (EGD) WITH PROPOFOL;  Surgeon: [name], MD;  Location: [facility] ENDOSCOPY;  Service: Gastroenterology;  Laterality: N/A;  . EXCISIONAL HEMORRHOIDECTOMY  1980s  . GIVENS CAPSULE STUDY N/A  12/08/2017   Procedure: GIVENS CAPSULE STUDY;  Surgeon: [name], MD;  Location: [facility] ENDOSCOPY;  Service: Gastroenterology;  Laterality: N/A;  . HEMORRHOID SURGERY N/A 10/29/2016   Procedure: HEMORRHOIDECTOMY;  Surgeon: Leighton Ruff, MD;  Location: [facility];  Service: General;  Laterality: N/A;  . MITRAL VALVE ANNULOPLASTY  10/23/1998   "Model [id number]; Campbell Lerner [id number]"; size 61mm; [facility]; Dr. Boyce Medici  . PILONIDAL CYST EXCISION  1954  . TEE WITH CARDIOVERSION  05-06-2006 at [facility];  01-02-2013 at [facility]  . TOTAL HIP ARTHROPLASTY Left 05/04/2017   Procedure: LEFT TOTAL HIP ARTHROPLASTY ANTERIOR APPROACH;  Surgeon: Mcarthur Rossetti, MD;  Location: [facility];  Service: Orthopedics;  Laterality: Left;  . TRANSTHORACIC ECHOCARDIOGRAM  05-01-2015   dr Caryl Comes   ef 50-55%/  mild AV sclerosis without stenosis/  post MV repair with mild central MR (valve area by pressure half-time 2cm^2,  valve area by continutity equation 0.91cm^2, peak grandiant 69mmHg)/  severe LAE/ mild TR/ mild RAE   . TUBAL LIGATION Bilateral 1978    Allergies  Allergen Reactions  . Amiodarone Hcl Swelling    SWELLING REACTION UNSPECIFIED   . Penicillins Rash    Has patient had a PCN reaction causing immediate rash, facial/tongue/throat swelling, SOB or lightheadedness with hypotension: No Has patient had a PCN reaction causing severe rash involving mucus membranes or skin necrosis: No Has patient had a PCN reaction that required hospitalization:Patient was inpatient when reaction occurred Has patient had a PCN reaction occurring within the last 10 years: No If all of the above answers are "NO", then may proceed with Cephalosporin use.   . Statins Rash   Medications Prior to Admission  Medication Sig Dispense Refill Last Dose  . albuterol (PROVENTIL HFA;VENTOLIN HFA) 108 (90 Base) MCG/ACT inhaler Inhale 2 puffs into the lungs every 4 (four) hours as needed for wheezing or shortness of breath. 1 Inhaler  0 unk  . alendronate (FOSAMAX) 70 MG tablet Take 1 tablet (70 mg total) by mouth every 7 (seven) days. Take with a full glass of water on an empty stomach. 12 tablet 1 06/11/2019  . diphenhydrAMINE (BENADRYL) 25 mg capsule Take 50 mg by mouth every 6 (six) hours as needed for itching.   unk  . ferrous sulfate 325 (65 FE) MG EC tablet Take 1 tablet (325 mg total) by mouth daily with breakfast. 90 tablet 3 06/16/2019 at Unknown time  . fluticasone (FLONASE) 50 MCG/ACT nasal spray Place 1 spray into both nostrils daily as needed for allergies.   unk  . levothyroxine (SYNTHROID) 25 MCG tablet Take 1 tablet (25 mcg total) by mouth daily before breakfast. 90 tablet 3 06/16/2019 at Unknown time  . loratadine (CLARITIN) 10 MG tablet Take 10 mg by mouth daily as  needed for allergies.    06/16/2019 at Unknown time  . metoprolol tartrate (LOPRESSOR) 50 MG tablet Take 1 tablet (50 mg total) by mouth 2 (two) times daily. (Patient taking differently: Take 25 mg by mouth 2 (two) times daily. ) 180 tablet 3 06/16/2019 at 0700  . Polyethyl Glycol-Propyl Glycol (LUBRICANT EYE DROPS) 0.4-0.3 % SOLN Place 1-2 drops into both eyes 3 (three) times daily as needed (for dry eyes.).   unk  . warfarin (COUMADIN) 5 MG tablet Take 1 tablet daily except take 1/2 tablet on Monday and Friday or TAKE AS DIRECTED BY ANTICOAGULATION CLINIC  90 day 90 tablet 1 06/16/2019 at Unknown time  . clindamycin (CLEOCIN) 150 MG capsule Take 600 mg by mouth as directed.   0 unk  . diltiazem (CARDIZEM CD) 120 MG 24 hr capsule Take 1 capsule (120 mg total) by mouth daily. 90 capsule 3    Family History  Problem Relation Age of Onset  . Lung cancer Father        smoker, died at 23  . Alcohol abuse Father   . Cancer Father        bladder and lung CA smoker  . Breast cancer Neg Hx   . Stroke Neg Hx     Social History   Socioeconomic History  . Marital status: Widowed    Spouse name: Not on file  . Number of children: 6  . Years of education:  Not on file  . Highest education level: Not on file  Occupational History  . Occupation: Architectural technologist: RETIRED  Tobacco Use  . Smoking status: Never Smoker  . Smokeless tobacco: Never Used  Substance and Sexual Activity  . Alcohol use: Yes    Alcohol/week: 0.0 standard drinks    Comment: seldom  . Drug use: No  . Sexual activity: Not Currently  Other Topics Concern  . Not on file  Social History Narrative   Retired. Daily Caffeine use: 2 daily    Social Determinants of Health   Financial Resource Strain:   . Difficulty of Paying Living Expenses:   Food Insecurity:   . Worried About Charity fundraiser in the Last Year:   . Arboriculturist in the Last Year:   Transportation Needs:   . Film/video editor (Medical):   [name] Lack of Transportation (Non-Medical):   Physical Activity:   . Days of Exercise per Week:   . Minutes of Exercise per Session:   Stress:   . Feeling of Stress :   Social Connections:   . Frequency of Communication with Friends and Family:   . Frequency of Social Gatherings with Friends and Family:   . Attends Religious Services:   . Active Member of Clubs or Organizations:   . Attends Archivist Meetings:   [name] Marital Status:   Intimate Partner Violence:   . Fear of Current or Ex-Partner:   . Emotionally Abused:   [name] Physically Abused:   . Sexually Abused:      ROS:  Jaw pain with moving her mouth, light underlying headache, sensation in her head like she is "holding her breath", fatigue.   As stated in the HPI and negative for all other systems.  Physical Exam: Blood pressure 140/77, pulse (!) 43, temperature 98.1 F (36.7 C), temperature source Oral, resp. rate (!) 26, height 5\' 5"  (1.651 m), weight 82.6 kg, SpO2 99 %.  GENERAL:  Well appearing HEENT:  Pupils equal  round and reactive, fundi not visualized, oral mucosa unremarkable NECK:  No jugular venous distention, waveform within normal limits, carotid upstroke brisk and  symmetric, no bruits, no thyromegaly LYMPHATICS:  No cervical, inguinal adenopathy LUNGS:  Clear to auscultation bilaterally BACK:  No CVA tenderness CHEST:  Unremarkable HEART:  PMI not displaced or sustained,S1 and S2 within normal limits, no S3, no clicks, no rubs, no murmurs, irregular ABD:  Flat, positive bowel sounds normal in frequency in pitch, no bruits, no rebound, no guarding, no midline pulsatile mass, no hepatomegaly, no splenomegaly EXT:  2 plus pulses throughout, no edema, no cyanosis no clubbing SKIN:  No rashes no nodules NEURO:  Cranial nerves II through XII grossly intact, motor grossly intact throughout PSYCH:  Cognitively intact, oriented to person place and time    Labs: Lab Results  Component Value Date   BUN 15 06/16/2019   Lab Results  Component Value Date   CREATININE 0.70 06/16/2019   Lab Results  Component Value Date   NA 142 06/16/2019   K 4.0 06/16/2019   CL 106 06/16/2019   CO2 23 06/16/2019   Lab Results  Component Value Date   TROPONINI <0.03 04/12/2018   Lab Results  Component Value Date   WBC 4.5 06/16/2019   HGB 12.2 06/16/2019   HCT 36.0 06/16/2019   MCV 89.2 06/16/2019   PLT 147 (L) 06/16/2019   Lab Results  Component Value Date   CHOL 198 09/08/2018   HDL 38.90 (L) 09/08/2018   LDLCALC 139 (H) 09/08/2018   LDLDIRECT 203.8 01/09/2008   TRIG 97.0 09/08/2018   CHOLHDL 5 09/08/2018   Lab Results  Component Value Date   ALT 19 06/16/2019   AST 26 06/16/2019   ALKPHOS 40 06/16/2019   BILITOT 0.9 06/16/2019   Echo:  1. Left ventricular ejection fraction, by estimation, is 55 to 60%. The left ventricle has normal function. The left ventricle has no regional wall motion abnormalities. Left ventricular diastolic function could not be evaluated. 2. Right ventricular systolic function is normal. The right ventricular size is normal. There is mildly elevated pulmonary artery systolic pressure. The estimated right ventricular  systolic pressure is [id number] mmHg. 3. Left atrial size was severely dilated. 4. A mitral valve annuloplasty ring is present. There is a heavily calcified focal mass-like structure present on the PMVL. This is likely deterioration of the PMVL with mitral annular calcification. There are no mobile components to it. There is no apparent destruction of the valve. There is mild to moderate MR. There is mild mitral stenosis. This is best viewed in the PLAX. When compared with the prior echo this was present but not specifically commented on. The mitral valve has been repaired/replaced. Mild to moderate mitral valve regurgitation. The mean mitral valve gradient is 6.3 mmHg with average heart rate of 80 bpm. There is a prosthetic annuloplasty ring present in the mitral position. Procedure Date: 10/23/1998. 5. The aortic valve is tricuspid. Aortic valve regurgitation is not visualized. Mild aortic valve sclerosis is present, with no evidence of aortic valve stenosis. 6. The inferior vena cava is dilated in size with <50% respiratory variability, suggesting right atrial pressure of 15 mmHg.    Radiology:    See above.   EKG:   Atrial fib, rate 83, axis WNL, intervals WNL.  RSR V1.  No acute St T wave changes.   ASSESSMENT AND PLAN:   ATRIAL FIB:  Rate is up.  She was taking a low dose of  beta blocker which we could restart tomorrow if still up.  However, trying to avoid beta blocker given the fatigue.  Ultimately this will be a decision for Dr. Caryl Comes and the patient is prepared for AV ablation/pacer if necessary.   HTN:  Permissive HTN for now.    CVA:    She has been intolerant of many meds.  She was subtherapeutic on her INR.  Continue warfarin rather than consider other therapy.  Need to keep INR closer to 2.5.  Perhaps she would do well with home INRs.    DYSLIPIDEMIA:   Continue current statin.  However, repeat lipid as the LDL was not at target last year.   Increase if not less than 70 or  change to Crestor if she would agree   MV REPAIR:  There is a mass like structure on the MV as above.  I would suggest TEE to assess given CVA.  Images reviewed.   I sent a message to try to save a spot for Monday.  Discussed with the patient.    SignedMinus Breeding 06/16/2019, 5:17 PM

## 2019-06-16 NOTE — ED Triage Notes (Signed)
Pt BIB [facility] from home. Per pt she woke up and felt like her left hand did not belong to her. Pt reports she is aware of her hand moving but doesn't feel like it is her hand. Pt does report some diminished sensation on her left hand. Denies any tingling in her left extremity. Pt is alert and oriented x4. LKW was 2300 last evening. Pt awoke with these symptoms at 0500 today.

## 2019-06-16 NOTE — Consult Note (Addendum)
[place]   Requesting Physician: Dr. Ralene Bathe    Chief Complaint: cannot identify left arm  History obtained from:  Patient    HPI:                                                                                                                                         JINGYI BAUGHER is an 86 y.o. female  With PMH a. Fib ( on coumadin), HLD, TIA (2006, 2011) who presented to [facility] ED with c/o problem identifying left arm.  patient went to bed last night about 11pm and woke up this morning  0500 with her hand on her face, and she did not realize that it was her left hand. She denies HA, dizziness, vision changes, confusion, weakness. Patient did take her warfarin this AM. Denies CP, SOB. No prior stroke history  ED course:  CTH: no hemorrhage CTA head and neck: no LVO-possible distal right M3 occlusion. CTP: core: 0 tmax: 68ml  BP: 146/87 BG: 98 creatinine: 0.7  Date last known well: 06/15/19 Time last known well: 1100 tPA Given: no; outside of the window Modified Rankin: Rankin Score=0 NIHSS:2   Past Medical History:  Diagnosis Date  . Allergic rhinitis   . 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 Arthritis   . Atrial fibrillation -persistent cardiologist-  dr klein/  primary EP -- dr Tawanna Sat ([facility])   a. s/p PVI [name] 2010;  b. on tikosyn/coumadin;  c. 05/2009 Echo: EF 60-65%, Gr 2 DD. (first dx 09/ 2007)  . Bilateral lower extremity edema   . Bleeding hemorrhoid   . Carotid stenosis    mild (hosp 3/11)- consult by vasc/ Dr Donnetta Hutching  . Complication of anesthesia    hard to wake  . Diverticulosis of colon   . Dyspnea    on exertion-climbing stairs  . Dysrhythmia   . Fatty liver   . H/O cardiac radiofrequency ablation    01/ 2008 at [facility] /  03/ 2010  at [facility]  . Heart failure with preserved ejection fraction ([place])   . Heart murmur    "prior to valve repair"  . History of adenomatous polyp of colon     tubular adenoma's  . History of cardiomyopathy    secondary tachycardia-induced cardiomyopathy -- resolved 2014  . History of squamous cell carcinoma in situ (SCCIS) of skin    05/ 2017  nasal bridge and right medial knee  . History of transient ischemic attack (TIA)    01-24-2005 and 06-12-2009  . Hyperlipidemia   . Hypothyroidism   . Mild intermittent asthma  reacts to cats  . Mixed stress and urge urinary incontinence   . Pulmonary nodule   . S/P mitral valve repair 10-23-1998  dr Boyce Medici at [facility]   for MVP and regurg. (annuloplasty ring procedure)  . Swelling of left extremity 2017   states it's gotten worse  . Varicose vein of leg   . Wears glasses     Past Surgical History:  Procedure Laterality Date  . APPENDECTOMY  1978  . CARDIAC ELECTROPHYSIOLOGY [place] AND ABLATION  01/ 2008    at [place]   right-sided ablation atrial flutter  . CARDIAC ELECTROPHYSIOLOGY STUDY AND ABLATION  03/ 2010   dr Jaymes Graff at [facility]   AV node ablation and pulmonary vein isolation for atrial fib  . CARDIOVERSION  06-18-2006;  07-13-2006;  10-19-2010;  10-27-2010  . COLONOSCOPY    . COLONOSCOPY WITH PROPOFOL N/A 10/13/2017   Procedure: COLONOSCOPY WITH PROPOFOL;  Surgeon: [name], MD;  Location: [facility] ENDOSCOPY;  Service: Gastroenterology;  Laterality: N/A;  . CYSTO/ TRANSURETHRAL COLLAGEN INJECTION THERAPY  07-26-2007   dr Matilde Sprang  . DILATION AND CURETTAGE OF UTERUS    . ESOPHAGOGASTRODUODENOSCOPY (EGD) WITH PROPOFOL N/A 10/13/2017   Procedure: ESOPHAGOGASTRODUODENOSCOPY (EGD) WITH PROPOFOL;  Surgeon: [name], MD;  Location: [facility] ENDOSCOPY;  Service: Gastroenterology;  Laterality: N/A;  . EXCISIONAL HEMORRHOIDECTOMY  1980s  . GIVENS CAPSULE STUDY N/A 12/08/2017   Procedure: GIVENS CAPSULE STUDY;  Surgeon: [name], MD;  Location: [facility] ENDOSCOPY;  Service: Gastroenterology;  Laterality: N/A;  . HEMORRHOID SURGERY N/A 10/29/2016   Procedure: HEMORRHOIDECTOMY;   Surgeon: Leighton Ruff, MD;  Location: [facility];  Service: General;  Laterality: N/A;  . MITRAL VALVE ANNULOPLASTY  10/23/1998   "Model [id number]; Campbell Lerner [id number]"; size 16mm; [facility]; Dr. Boyce Medici  . PILONIDAL CYST EXCISION  1954  . TEE WITH CARDIOVERSION  05-06-2006 at [facility];  01-02-2013 at [facility]  . TOTAL HIP ARTHROPLASTY Left 05/04/2017   Procedure: LEFT TOTAL HIP ARTHROPLASTY ANTERIOR APPROACH;  Surgeon: Mcarthur Rossetti, MD;  Location: [place];  Service: Orthopedics;  Laterality: Left;  . TRANSTHORACIC ECHOCARDIOGRAM  05-01-2015   dr Caryl Comes   ef 50-55%/  mild AV sclerosis without stenosis/  post MV repair with mild central MR (valve area by pressure half-time 2cm^2,  valve area by continutity equation 0.91cm^2, peak grandiant 9mmHg)/  severe LAE/ mild TR/ mild RAE   . TUBAL LIGATION Bilateral 1978    Family History  Problem Relation Age of Onset  . Lung cancer Father        smoker  . Alcohol abuse Father   . Cancer Father        bladder and lung CA smoker  . Breast cancer Neg Hx          Social History:  reports that she has never smoked. She has never used smokeless tobacco. She reports current alcohol use. She reports that she does not use drugs.  Allergies:  Allergies  Allergen Reactions  . Amiodarone Hcl Swelling    SWELLING REACTION UNSPECIFIED   . Penicillins Rash    Has patient had a PCN reaction causing immediate rash, facial/tongue/throat swelling, SOB or lightheadedness with hypotension: No Has patient had a PCN reaction causing severe rash involving mucus membranes or skin necrosis: No Has patient had a PCN reaction that required hospitalization:Patient was inpatient when reaction occurred Has patient had a PCN reaction occurring within the last 10 years: No If all of the above answers are "NO",  then may proceed with Cephalosporin use.   . Statins Rash    Medications:                                                                                                                            Current Facility-Administered Medications  Medication Dose Route Frequency Provider Last Rate Last Admin  . iohexol (OMNIPAQUE) 350 MG/ML injection 100 mL  100 mL Intravenous Once PRN Amie Portland, MD       Current Outpatient Medications  Medication Sig Dispense Refill  . albuterol (PROVENTIL HFA;VENTOLIN HFA) 108 (90 Base) MCG/ACT inhaler Inhale 2 puffs into the lungs every 4 (four) hours as needed for wheezing or shortness of breath. 1 Inhaler 0  . alendronate (FOSAMAX) 70 MG tablet Take 1 tablet (70 mg total) by mouth every 7 (seven) days. Take with a full glass of water on an empty stomach. 12 tablet 1  . Cholecalciferol (VITAMIN D3) 2000 units TABS Take 4,000 Units by mouth daily.    . clindamycin (CLEOCIN) 150 MG capsule TAKE 4 CAPSULES BY MOUTH 1 HR PRIOR TO DENTAL APPT  0  . diltiazem (CARDIZEM CD) 120 MG 24 hr capsule Take 1 capsule (120 mg total) by mouth daily. 90 capsule 3  . diphenhydrAMINE (BENADRYL) 25 mg capsule Take 50 mg by mouth every 6 (six) hours as needed for itching.    . ferrous sulfate 325 (65 FE) MG EC tablet Take 1 tablet (325 mg total) by mouth daily with breakfast. 90 tablet 3  . fluticasone (FLONASE) 50 MCG/ACT nasal spray Place 1 spray into both nostrils daily as needed for allergies.    [name] levothyroxine (SYNTHROID) 25 MCG tablet Take 1 tablet (25 mcg total) by mouth daily before breakfast. 90 tablet 3  . loratadine (CLARITIN) 10 MG tablet Take 10 mg by mouth daily as needed for allergies.     . metoprolol tartrate (LOPRESSOR) 50 MG tablet Take 1 tablet (50 mg total) by mouth 2 (two) times daily. 180 tablet 3  . omeprazole (PRILOSEC) 40 MG capsule TAKE 1 CAPSULE BY MOUTH EVERY DAY 30 capsule 0  . Polyethyl Glycol-Propyl Glycol (LUBRICANT EYE DROPS) 0.4-0.3 % SOLN Place 1-2 drops into both eyes 3 (three) times daily as needed (for dry eyes.).    [name] warfarin (COUMADIN) 5 MG tablet Take 1 tablet daily except take 1/2  tablet on Monday and Friday or TAKE AS DIRECTED BY ANTICOAGULATION CLINIC  90 day 90 tablet 1    ROS:  ROS was performed and is negative except as noted in HPI    General Examination:                                                                                                      Blood pressure (!) 137/95, pulse 89, temperature 98.1 F (36.7 C), temperature source Oral, resp. rate 12, height 5\' 5"  (1.651 m), weight 82.6 kg, SpO2 97 %.  Physical Exam  Constitutional: Appears well-developed and well-nourished.  Psych: Affect appropriate to situation Eyes: Normal external eye and conjunctiva. HENT: Normocephalic, no lesions, without obvious abnormality.   Musculoskeletal-no joint tenderness, deformity or swelling Cardiovascular: Normal rate and regular rhythm.  Respiratory: Effort normal, non-labored breathing saturations WNL GI: Soft.  No distension. There is no tenderness.  Skin: WDI  Neurological Examination Mental Status: Alert, oriented, thought content appropriate.  Speech fluent without evidence of aphasia.  Able to follow  commands without difficulty. Cranial Nerves: II:  Visual fields grossly normal,  III,IV, VI: ptosis not present, extra-ocular motions intact bilaterally, pupils equal, round, reactive to light and accommodation V,VII: smile symmetric, facial light touch sensation normal bilaterally VIII: hearing normal bilaterally IX,X: uvula rises midline XI: bilateral shoulder shrug XII: midline tongue extension Motor: Right : Upper extremity   5/5  Left:     Upper extremity   5/5  Lower extremity   5/5   Lower extremity   5/5 Tone and bulk:normal tone throughout; no atrophy noted Sensory: neglecting left with LT DSS Cerebellar: Dysmetria with FNF Gait: deferred   Lab Results: Basic Metabolic Panel: Recent Labs  Lab  06/16/19 0934  NA 142  K 4.0  CL 106  GLUCOSE 98  BUN 15  CREATININE 0.70    CBC: Recent Labs  Lab 06/16/19 0910 06/16/19 0934  WBC 4.5  --   NEUTROABS 2.8  --   HGB 12.4 12.2  HCT 39.0 36.0  MCV 89.2  --   PLT 147*  --     CBG: Recent Labs  Lab 06/16/19 0842  GLUCAP 94    Imaging: CT HEAD CODE STROKE WO CONTRAST  Result Date: 06/16/2019 CLINICAL DATA:  Code stroke. Neuro deficit, acute, stroke suspected. Additional history provided: Last known normal 5 a.m., patient awoke and felt as if her left hand did not along to her. EXAM: CT HEAD WITHOUT CONTRAST TECHNIQUE: Contiguous axial images were obtained from the base of the skull through the vertex without intravenous contrast. COMPARISON:  Brain MRI 11/21/2018, head CT 04/12/2018 FINDINGS: Brain: There is no evidence of acute intracranial hemorrhage, intracranial mass, midline shift or extra-axial fluid collection.No demarcated cortical infarction. Mild ill-defined hypoattenuation within the cerebral white matter is nonspecific, but consistent with chronic small vessel ischemic disease and stable from prior exams. Stable, mild generalized parenchymal atrophy. 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 Vascular: No hyperdense vessel.  Atherosclerotic calcifications. Skull: Normal. Negative for fracture or focal lesion. Sinuses/Orbits: Visualized orbits demonstrate no acute abnormality. Mild ethmoid sinus mucosal thickening. No significant mastoid effusion. These results were communicated to Dr. Rory Percy At  9:37 amon 3/26/2021by text page via the [facility] messaging system. IMPRESSION: 1. No evidence of acute intracranial abnormality. 2. 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 3. Stable generalized parenchymal atrophy and chronic small vessel ischemic disease. 4. Mild ethmoid sinus mucosal thickening. Electronically Signed   By: Kellie Simmering DO   On:  06/16/2019 09:38     Laurey Morale, MSN, NP-C [facility] [phone]  06/16/2019, 9:17 AM   Attending physician note to follow with Assessment and plan .   Assessment: 86 y.o. female  With PMH a.fib (on coumadin), HLD who presented to [facility] ED with c/o not being able to identify  Her left arm. Code stroke initiated in ED. Initial NIHSS 2 for ataxia and extinction. Patient was not a candidate for TPA given LSN of 2300 06/15/19. CTA did not show an LVO-showed a distal right M3 occlusion-discussed with IR-to distal into low for stroke scale to pursue intervention Stroke Risk Factors - atrial fibrillation and hyperlipidemia    Likely cardioembolic stroke due to subtherapeutic anticoagulation.  Recommendations: --BP goal : Permissive HTN-he is anticoagulated, I would allow permissive hypertension for 24-48 post admission but not up to [id number] systolic but rather [id number] systolic. --MRI Brain without contrast --Echocardiogram -- continue coumadin with goal INR between 2.0-3.0 -consider oral anticoagulant if appropriate. -- High intensity Statin if LDL > 70 -- HgbA1c, fasting lipid panel -- PT consult, OT consult, Speech consult --Telemetry monitoring --Frequent neuro checks --Stroke swallow screen  --please page stroke NP  Or  PA  Or MD from 8am -4 pm  as this patient from this time will be  followed by the stroke.   You can look them up on [email or web address]  [place]  Attending Neurohospitalist Addendum Patient seen and examined with APP/Resident. Agree with the history and physical as documented above. Agree with the plan as documented, which I helped formulate. I have independently reviewed the chart, obtained history, review of systems and examined the patient.I have personally reviewed pertinent head/neck/spine imaging (CT/MRI). Please feel free to call with any questions.  86 year old on Coumadin, presenting with last known normal left 11 PM last night prior to going to  bed, waking up having difficulty recognizing her own hand. On examination, she has subtle left-sided neglect and subtle left-sided ataxia-likely sensory ataxia. NIH stroke scale 2. Imaging personally reviewed.  No emergent LVO but there is a distal right M3 occlusion which given her low stroke scale is not amenable for intervention. She is outside the window for IV TPA.  On Coumadin with INR 1.9.  Likely cardioembolic stroke Recommendations: Stroke work-up as above.  Discussed my plan with ED provider.  --- Amie Portland, MD [facility] Pager: [phone]  If 7pm to 7am, please call on call as listed on AMION.  CRITICAL CARE ATTESTATION Performed by: Amie Portland, MD Total critical care time: 50 minutes Critical care time was exclusive of separately billable procedures and treating other patients and/or supervising APPs/Residents/Students Critical care was necessary to treat or prevent imminent or life-threatening deterioration due to acute ischemic stroke. This patient is critically ill and at significant risk for neurological worsening and/or death and care requires constant monitoring. Critical care was time spent personally by me on the following activities: development of treatment plan with patient and/or surrogate as well as nursing, discussions with consultants, evaluation of patient's response to treatment, examination of patient, obtaining history from patient or surrogate, ordering and performing treatments and interventions, ordering and review of laboratory  studies, ordering and review of radiographic studies, pulse oximetry, re-evaluation of patient's condition, participation in multidisciplinary rounds and medical decision making of high complexity in the care of this patient.

## 2019-06-16 NOTE — ED Notes (Signed)
Pt transported to MRI 

## 2019-06-16 NOTE — ED Provider Notes (Signed)
[facility] EMERGENCY DEPARTMENT Provider Note   CSN: [id number] Arrival date & time: 06/16/19  0845     History No chief complaint on file.   [name] is a 86 y.o. female with history of permanent A.fib on Warfarin who presents with left arm neglect. She went to sleep around 11PM last night. She woke up at [facility] and felt something on her face and realized it was her left hand. Since this time she feels like she cannot identify her left arm when she sees it and feels like it could be someone else's arm. She reports associated decreased sensation in the left arm. She is LHD. She denies headache, dizziness, change in vision, aphasia, confusion, arm weakness, gait difficulty. She told her son about her symptoms and they called EMS. She took her Warfarin this morning and reports adherence. No chest pain, SOB, abdominal pain.  HPI     Past Medical History:  Diagnosis Date  . Allergic rhinitis   . 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 Arthritis   . Atrial fibrillation -persistent cardiologist-  dr klein/  primary EP -- [facility] Tawanna Sat ([facility])   a. s/p PVI [name] 2010;  b. on tikosyn/coumadin;  c. 05/2009 Echo: EF 60-65%, Gr 2 DD. (first dx 09/ 2007)  . Bilateral lower extremity edema   . Bleeding hemorrhoid   . Carotid stenosis    mild (hosp 3/11)- consult by vasc/ Dr Donnetta Hutching  . Complication of anesthesia    hard to wake  . Diverticulosis of colon   . Dyspnea    on exertion-climbing stairs  . Dysrhythmia   . Fatty liver   . H/O cardiac radiofrequency ablation    01/ 2008 at [facility] /  03/ 2010  at [facility]  . Heart failure with preserved ejection fraction ([place])   . Heart murmur    "prior to valve repair"  . History of adenomatous polyp of colon    tubular adenoma's  . History of cardiomyopathy    secondary tachycardia-induced cardiomyopathy -- resolved 2014  . History of squamous cell carcinoma in situ (SCCIS) of skin    05/ 2017  nasal bridge and right  medial knee  . History of transient ischemic attack (TIA)    01-24-2005 and 06-12-2009  . Hyperlipidemia   . Hypothyroidism   . Mild intermittent asthma    reacts to cats  . Mixed stress and urge urinary incontinence   . Pulmonary nodule   . S/P mitral valve repair 10-23-1998  dr Boyce Medici at [facility]   for MVP and regurg. (annuloplasty ring procedure)  . Swelling of left extremity 2017   states it's gotten worse  . Varicose vein of leg   . Wears glasses     Patient Active Problem List   Diagnosis Date Noted  . Facial tingling 11/29/2018  . Tremor of left hand 11/29/2018  . Medicare annual wellness visit, subsequent 11/24/2018  . Dysuria 02/06/2018  . Iron deficiency anemia 10/21/2017  . Rapid atrial fibrillation ([place]) 08/19/2017  . Constipation 08/02/2017  . Numbness and tingling 07/14/2017  . Paresthesia 07/14/2017  . Unilateral primary osteoarthritis, left hip 05/04/2017  . Status post total replacement of left hip 05/04/2017  . Hip osteoarthritis 04/27/2017  . Long term (current) use of anticoagulants 03/04/2017  . Venous stasis dermatitis of both lower extremities 01/08/2017  . Impacted cerumen of right ear 11/20/2016  . Osteopenia 10/25/2016  . Pedal edema 08/26/2016  . Varicose veins  of both lower extremities 08/26/2016  . Estrogen deficiency 08/26/2016  . Screening mammogram, encounter for 08/26/2016  . Hemorrhoids 08/26/2016  . History of nonmelanoma skin cancer 01/01/2016  . Hip pain 08/02/2014  . Left knee pain 08/02/2014  . Chronic cough 05/08/2014  . Caregiver stress 08/16/2013  . Colon cancer screening 08/16/2013  . Encounter for therapeutic drug monitoring 04/20/2013  . Left ovarian cyst 03/14/2013  . (HFpEF) heart failure with preserved ejection fraction ([place]) 12/27/2012  . Palpitations 04/15/2012  . Cardiomyopathy, secondary --Resolved again 10/14 10/13/2010  . COLONIC POLYPS, ADENOMATOUS, HX OF 09/18/2009  . PULMONARY NODULE 12/20/2008  .  GANGLION CYST 10/04/2007  . Hyperlipidemia 04/27/2007  . Depression with anxiety 04/27/2007  . Asthma, mild intermittent 04/27/2007  . INSOMNIA 04/27/2007  . ADENOMATOUS COLONIC POLYP 11/04/2006  . Hypothyroidism 09/02/2006  . Atrial fibrillation ([place]) 08/05/2006    Past Surgical History:  Procedure Laterality Date  . APPENDECTOMY  1978  . CARDIAC ELECTROPHYSIOLOGY [place] AND ABLATION  01/ 2008    at [place]   right-sided ablation atrial flutter  . CARDIAC ELECTROPHYSIOLOGY STUDY AND ABLATION  03/ 2010   dr Jaymes Graff at [facility]   AV node ablation and pulmonary vein isolation for atrial fib  . CARDIOVERSION  06-18-2006;  07-13-2006;  10-19-2010;  10-27-2010  . COLONOSCOPY    . COLONOSCOPY WITH PROPOFOL N/A 10/13/2017   Procedure: COLONOSCOPY WITH PROPOFOL;  Surgeon: [name], MD;  Location: [facility] ENDOSCOPY;  Service: Gastroenterology;  Laterality: N/A;  . CYSTO/ TRANSURETHRAL COLLAGEN INJECTION THERAPY  07-26-2007   dr Matilde Sprang  . DILATION AND CURETTAGE OF UTERUS    . ESOPHAGOGASTRODUODENOSCOPY (EGD) WITH PROPOFOL N/A 10/13/2017   Procedure: ESOPHAGOGASTRODUODENOSCOPY (EGD) WITH PROPOFOL;  Surgeon: [name], MD;  Location: [facility] ENDOSCOPY;  Service: Gastroenterology;  Laterality: N/A;  . EXCISIONAL HEMORRHOIDECTOMY  1980s  . GIVENS CAPSULE STUDY N/A 12/08/2017   Procedure: GIVENS CAPSULE STUDY;  Surgeon: [name], MD;  Location: [facility] ENDOSCOPY;  Service: Gastroenterology;  Laterality: N/A;  . HEMORRHOID SURGERY N/A 10/29/2016   Procedure: HEMORRHOIDECTOMY;  Surgeon: Leighton Ruff, MD;  Location: [facility];  Service: General;  Laterality: N/A;  . MITRAL VALVE ANNULOPLASTY  10/23/1998   "Model [id number]; Campbell Lerner [id number]"; size 106mm; [facility]; Dr. Boyce Medici  . PILONIDAL CYST EXCISION  1954  . TEE WITH CARDIOVERSION  05-06-2006 at [facility];  01-02-2013 at [facility]  . TOTAL HIP ARTHROPLASTY Left 05/04/2017   Procedure: LEFT TOTAL HIP ARTHROPLASTY ANTERIOR  APPROACH;  Surgeon: Mcarthur Rossetti, MD;  Location: [place];  Service: Orthopedics;  Laterality: Left;  . TRANSTHORACIC ECHOCARDIOGRAM  05-01-2015   dr Caryl Comes   ef 50-55%/  mild AV sclerosis without stenosis/  post MV repair with mild central MR (valve area by pressure half-time 2cm^2,  valve area by continutity equation 0.91cm^2, peak grandiant 52mmHg)/  severe LAE/ mild TR/ mild RAE   . TUBAL LIGATION Bilateral 1978     OB History   No obstetric history on file.     Family History  Problem Relation Age of Onset  . Lung cancer Father        smoker  . Alcohol abuse Father   . Cancer Father        bladder and lung CA smoker  . Breast cancer Neg Hx     Social History   Tobacco Use  . Smoking status: Never Smoker  . Smokeless tobacco: Never Used  Substance Use Topics  . Alcohol use: Yes  Alcohol/week: 0.0 standard drinks    Comment: seldom  . Drug use: No    Home Medications Prior to Admission medications   Medication Sig Start Date End Date Taking? Authorizing Provider  albuterol (PROVENTIL HFA;VENTOLIN HFA) 108 (90 Base) MCG/ACT inhaler Inhale 2 puffs into the lungs every 4 (four) hours as needed for wheezing or shortness of breath. 06/23/16   Tower, Wynelle Fanny, MD  alendronate (FOSAMAX) 70 MG tablet Take 1 tablet (70 mg total) by mouth every 7 (seven) days. Take with a full glass of water on an empty stomach. 09/27/18   Tower, Wynelle Fanny, MD  Cholecalciferol (VITAMIN D3) 2000 units TABS Take 4,000 Units by mouth daily.    [provider]  clindamycin (CLEOCIN) 150 MG capsule TAKE 4 CAPSULES BY MOUTH 1 HR PRIOR TO DENTAL APPT 09/06/17   [provider]  diltiazem (CARDIZEM CD) 120 MG 24 hr capsule Take 1 capsule (120 mg total) by mouth daily. 01/13/19 04/13/19  Deboraha Sprang, MD  diphenhydrAMINE (BENADRYL) 25 mg capsule Take 50 mg by mouth every 6 (six) hours as needed for itching.    [provider]  ferrous sulfate 325 (65 FE) MG EC tablet Take 1  tablet (325 mg total) by mouth daily with breakfast. 11/29/18   Tower, Wynelle Fanny, MD  fluticasone (FLONASE) 50 MCG/ACT nasal spray Place 1 spray into both nostrils daily as needed for allergies.    [provider]  levothyroxine (SYNTHROID) 25 MCG tablet Take 1 tablet (25 mcg total) by mouth daily before breakfast. 11/29/18   Tower, Wynelle Fanny, MD  loratadine (CLARITIN) 10 MG tablet Take 10 mg by mouth daily as needed for allergies.     [provider]  metoprolol tartrate (LOPRESSOR) 50 MG tablet Take 1 tablet (50 mg total) by mouth 2 (two) times daily. 02/23/19 05/24/19  Deboraha Sprang, MD  omeprazole (PRILOSEC) 40 MG capsule TAKE 1 CAPSULE BY MOUTH EVERY DAY 04/18/19   [name], MD  Polyethyl Glycol-Propyl Glycol (LUBRICANT EYE DROPS) 0.4-0.3 % SOLN Place 1-2 drops into both eyes 3 (three) times daily as needed (for dry eyes.).    [provider]  warfarin (COUMADIN) 5 MG tablet Take 1 tablet daily except take 1/2 tablet on Monday and Friday or TAKE AS DIRECTED BY ANTICOAGULATION CLINIC  90 day 10/11/18   Tower, Wynelle Fanny, MD    Allergies    Amiodarone hcl, Penicillins, and Statins  Review of Systems   Review of Systems  Constitutional: Negative for fever.  Respiratory: Negative for shortness of breath.   Cardiovascular: Negative for chest pain.  Gastrointestinal: Negative for abdominal pain.  Musculoskeletal: Negative for back pain and neck pain.  Neurological: Positive for numbness. Negative for dizziness, facial asymmetry, weakness, light-headedness and headaches.  Psychiatric/Behavioral: Negative for confusion.  All other systems reviewed and are negative.   Physical Exam Updated Vital Signs BP (!) 137/95   Pulse 89   Temp 98.1 F (36.7 C) (Oral)   Resp 12   Ht 5\' 5"  (1.651 m)   Wt 82.6 kg   SpO2 97%   BMI 30.29 kg/m   Physical Exam Vitals and nursing note reviewed.  Constitutional:      General: She is not in acute distress.    Appearance: Normal  appearance. She is well-developed. She is not ill-appearing.     Comments: Calm and cooperative. NAD  HENT:     Head: Normocephalic and atraumatic.  Eyes:     General: No  scleral icterus.       Right eye: No discharge.        Left eye: No discharge.     Conjunctiva/sclera: Conjunctivae normal.     Pupils: Pupils are equal, round, and reactive to light.  Cardiovascular:     Rate and Rhythm: Normal rate and regular rhythm.  Pulmonary:     Effort: Pulmonary effort is normal. No respiratory distress.     Breath sounds: Normal breath sounds.  Abdominal:     General: There is no distension.     Palpations: Abdomen is soft.     Tenderness: There is no abdominal tenderness.  Musculoskeletal:     Cervical back: Normal range of motion.  Skin:    General: Skin is warm and dry.  Neurological:     Mental Status: She is alert and oriented to person, place, and time.     Comments: Mental Status:  Alert, oriented, thought content appropriate, able to give a coherent history. Speech fluent without evidence of aphasia. Able to follow 2 step commands without difficulty.  Cranial Nerves:  II:  Peripheral visual fields grossly normal, pupils equal, round, reactive to light III,IV, VI: ptosis not present, extra-ocular motions intact bilaterally  V,VII: smile symmetric, facial light touch sensation equal VIII: hearing grossly normal to voice  X: uvula elevates symmetrically  XI: bilateral shoulder shrug symmetric and strong XII: midline tongue extension without fassiculations Motor:  Normal tone. 5/5 in upper and lower extremities bilaterally including strong and equal grip strength and dorsiflexion/plantar flexion Sensory: Decreased sensation in the LUE. There is neglect of the LUE.  Cerebellar: normal finger-to-nose with bilateral upper extremities Gait: normal gait and balance CV: distal pulses palpable throughout    Psychiatric:        Behavior: Behavior normal.     ED Results /  Procedures / Treatments   Labs (all labs ordered are listed, but only abnormal results are displayed) Labs Reviewed  PROTIME-INR - Abnormal; Notable for the following components:      Result Value   Prothrombin Time 21.7 (*)    INR 1.9 (*)    All other components within normal limits  CBC - Abnormal; Notable for the following components:   Platelets 147 (*)    All other components within normal limits  COMPREHENSIVE METABOLIC PANEL - Abnormal; Notable for the following components:   Glucose, Bld 104 (*)    All other components within normal limits  SARS CORONAVIRUS 2 (TAT 6-24 HRS)  ETHANOL  APTT  DIFFERENTIAL  RAPID URINE DRUG SCREEN, HOSP PERFORMED  URINALYSIS, ROUTINE W REFLEX MICROSCOPIC  CBG MONITORING, ED  I-STAT CHEM 8, ED    EKG EKG Interpretation  Date/Time:  Friday June 16 2019 08:51:13 EDT Ventricular Rate:  83 PR Interval:    QRS Duration: 100 QT Interval:  375 QTC Calculation: 441 R Axis:   28 Text Interpretation: Atrial fibrillation RSR' in V1 or V2, probably normal variant Nonspecific T abnormalities, lateral leads Confirmed by Quintella Reichert [phone]) on 06/16/2019 9:06:46 AM   Radiology CT HEAD CODE STROKE WO CONTRAST  Result Date: 06/16/2019 CLINICAL DATA:  Code stroke. Neuro deficit, acute, stroke suspected. Additional history provided: Last known normal 5 a.m., patient awoke and felt as if her left hand did not along to her. EXAM: CT HEAD WITHOUT CONTRAST TECHNIQUE: Contiguous axial images were obtained from the base of the skull through the vertex without intravenous contrast. COMPARISON:  Brain MRI 11/21/2018, head CT 04/12/2018 FINDINGS: Brain: There is  no evidence of acute intracranial hemorrhage, intracranial mass, midline shift or extra-axial fluid collection.No demarcated cortical infarction. Mild ill-defined hypoattenuation within the cerebral white matter is nonspecific, but consistent with chronic small vessel ischemic disease and stable from  prior exams. Stable, mild generalized parenchymal atrophy. 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 Vascular: No hyperdense vessel.  Atherosclerotic calcifications. Skull: Normal. Negative for fracture or focal lesion. Sinuses/Orbits: Visualized orbits demonstrate no acute abnormality. Mild ethmoid sinus mucosal thickening. No significant mastoid effusion. These results were communicated to Dr. Rory Percy At 9:37 amon 3/26/2021by text page via the [facility] messaging system. IMPRESSION: 1. No evidence of acute intracranial abnormality. 2. 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 3. Stable generalized parenchymal atrophy and chronic small vessel ischemic disease. 4. Mild ethmoid sinus mucosal thickening. Electronically Signed   By: Kellie Simmering DO   On: 06/16/2019 09:38    Procedures Procedures (including critical care time)  Medications Ordered in ED Medications  iohexol (OMNIPAQUE) 350 MG/ML injection 100 mL (100 mLs Intravenous Contrast Given 06/16/19 0948)    ED Course  I have reviewed the triage vital signs and the nursing notes.  Pertinent labs & imaging results that were available during my care of the patient were reviewed by me and considered in my medical decision making (see chart for details).  86 year old female presents with L sided neglect after waking up around 5AM this morning. She is hypertensive but otherwise vitals are normal. Heart is irregularly irregular. EKG shows rate controlled A.fib. Lungs are CTA. Her neurologic exam is remarkable for left upper extremity neglect. Will initiate code stroke. Pt has not weakness so is LVO negative. Shared visit with Dr. Ralene Bathe. Discussed with Dr. Rory Percy who will come to see pt.  CT head is negative. CTA head/neck and perfusion study ordered by neurology. Labs reviewed are normal. INR is mildly subtherapeutic.   Neurology states pt has clot in the right parietal  region however he does not recommend any intervention with tpa or IR since she is out of the window, anticoagulated, and clot is in a difficult location the risk would outweigh benefits. He recommends hospitalist admission.  Discussed with Dr. Lorin Mercy with [facility] who will admit. COVID test ordered.  MDM Rules/Calculators/A&P                       Final Clinical Impression(s) / ED Diagnoses Final diagnoses:  Cerebrovascular accident (CVA), unspecified mechanism ([place])  Left-sided neglect    Rx / DC Orders ED Discharge Orders    None       Recardo Evangelist, PA-C 06/16/19 1043    Quintella Reichert, MD 06/16/19 1555

## 2019-06-16 NOTE — Progress Notes (Signed)
Cardiology notified of HR 120's to 140's.  Currently, no new orders.

## 2019-06-16 NOTE — Progress Notes (Signed)
  Echocardiogram 2D Echocardiogram has been performed.  Jennette Dubin 06/16/2019, 3:13 PM

## 2019-06-16 NOTE — Progress Notes (Signed)
ANTICOAGULATION CONSULT NOTE - Initial Consult  Pharmacy Consult for Warfarin Indication: atrial fibrillation  Allergies  Allergen Reactions  . Amiodarone Hcl Swelling    SWELLING REACTION UNSPECIFIED   . Penicillins Rash    Has patient had a PCN reaction causing immediate rash, facial/tongue/throat swelling, SOB or lightheadedness with hypotension: No Has patient had a PCN reaction causing severe rash involving mucus membranes or skin necrosis: No Has patient had a PCN reaction that required hospitalization:Patient was inpatient when reaction occurred Has patient had a PCN reaction occurring within the last 10 years: No If all of the above answers are "NO", then may proceed with Cephalosporin use.   . Statins Rash    Patient Measurements: Height: 5\' 5"  (165.1 cm) Weight: 182 lb (82.6 kg) IBW/kg (Calculated) : 57 Heparin Dosing Weight:   Vital Signs: Temp: 98.1 F (36.7 C) (03/26 0854) Temp Source: Oral (03/26 0854) BP: 140/77 (03/26 1145) Pulse Rate: 87 (03/26 1145)  Labs: Recent Labs    06/16/19 0910 06/16/19 0934  HGB 12.4 12.2  HCT 39.0 36.0  PLT 147*  --   APTT 36  --   LABPROT 21.7*  --   INR 1.9*  --   CREATININE 0.78 0.70    Estimated Creatinine Clearance: 55.5 mL/min (by C-G formula based on SCr of 0.7 mg/dL).   Medical History: Past Medical History:  Diagnosis Date  . Allergic rhinitis   . 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 Arthritis   . Atrial fibrillation -persistent cardiologist-  dr klein/  primary EP -- [facility] Tawanna Sat ([facility])   a. s/p PVI [name] 2010;  b. on tikosyn/coumadin;  c. 05/2009 Echo: EF 60-65%, Gr 2 DD. (first dx 09/ 2007)  . Bilateral lower extremity edema   . Bleeding hemorrhoid   . Carotid stenosis    mild (hosp 3/11)- consult by vasc/ Dr Donnetta Hutching  . Complication of anesthesia    hard to wake  . Diverticulosis of colon   . Dyspnea    on exertion-climbing stairs  . Fatty liver   . H/O cardiac radiofrequency ablation    01/ 2008  at [facility] /  03/ 2010  at [facility]  . Heart failure with preserved ejection fraction ([place])   . History of adenomatous polyp of colon    tubular adenoma's  . History of cardiomyopathy    secondary tachycardia-induced cardiomyopathy -- resolved 2014  . History of squamous cell carcinoma in situ (SCCIS) of skin    05/ 2017  nasal bridge and right medial knee  . History of transient ischemic attack (TIA)    01-24-2005 and 06-12-2009  . Hyperlipidemia   . Hypothyroidism   . Mild intermittent asthma    reacts to cats  . Mixed stress and urge urinary incontinence   . Pulmonary nodule   . S/P mitral valve repair 10-23-1998  dr Boyce Medici at [facility]   for MVP and regurg. (annuloplasty ring procedure)    Medications:  Scheduled:  .  stroke: mapping our early stages of recovery book   Does not apply Once  . aspirin  300 mg Rectal Daily   Or  . aspirin  325 mg Oral Daily  . enoxaparin (LOVENOX) injection  40 mg Subcutaneous Q24H  . [START ON 06/17/2019] levothyroxine  25 mcg Oral QAC breakfast    Assessment: Patient is a 73 yof that is being admitted for a possible CVA. The patient has a hx of Afib and MVR. Pharmacy has been asked  to dose her home warfarin at this time. The patient's INR goal is 2-3.  PTA Warfarin regimen (5mg  daily except M,F 2.5mg )    Goal of Therapy:  INR 2-3 Monitor platelets by anticoagulation protocol: Yes   Plan:  - Patient's INR is just slightly subtherapeutic at 1.9  - Patient already took Warfarin 2.5mg  prior to arrival  - Will not give another dose this evening  - Monitor patient's INR and cbc   Duanne Limerick PharmD. BCPS  06/16/2019,1:59 PM

## 2019-06-16 NOTE — Code Documentation (Signed)
Stroke Response Nurse Documentation Code Documentation  JIMI TEETS is a 86 y.o. female arriving to [facility] ED via [facility] on 06/16/2019  with past medical hx of A Fib/Flutter,Hyperlipidemia, CAD,heart failure.  Code stroke was activated by ED. Patient from home where she was LKW at 2300 when she went to bed and is now complaining of "left hand not belonging to her." On warfarin daily. Stroke team at the bedside on patient arrival to CT. Labs drawn and patient cleared for CT by ED PA, [name]. NIHSS 2, see documentation for details and code stroke times. Patient with left limb ataxia and Sensory  neglect on exam. The following imaging was completed: CT, CTA head and neck, CTP. Patient is not a candidate for tPA due to outside window. Patient to be admitted and q 2 hour neuro and vital sign assessments.  Bedside handoff with ED RN Elmyra Ricks.    Lianne Bushy A  Stroke Response RN

## 2019-06-17 DIAGNOSIS — I639 Cerebral infarction, unspecified: Secondary | ICD-10-CM

## 2019-06-17 LAB — LIPID PANEL
Cholesterol: 218 mg/dL — ABNORMAL HIGH (ref 0–200)
HDL: 28 mg/dL — ABNORMAL LOW (ref >40)
LDL Cholesterol: 169 mg/dL — ABNORMAL HIGH (ref 0–99)
Total CHOL/HDL Ratio: 7.8 RATIO
Triglycerides: 104 mg/dL (ref <150)
VLDL: 21 mg/dL (ref 0–40)

## 2019-06-17 LAB — PROTIME-INR
INR: 1.8 — ABNORMAL HIGH (ref 0.8–1.2)
Prothrombin Time: 20.7 seconds — ABNORMAL HIGH (ref 11.4–15.2)

## 2019-06-17 LAB — HEMOGLOBIN A1C
Hgb A1c MFr Bld: 5.8 % — ABNORMAL HIGH (ref 4.8–5.6)
Mean Plasma Glucose: 119.76 mg/dL

## 2019-06-17 MED ORDER — WARFARIN - PHARMACIST DOSING INPATIENT: Freq: Every day | Status: DC

## 2019-06-17 MED ORDER — WARFARIN SODIUM 5 MG PO TABS
5.0000 mg | ORAL_TABLET | Freq: Once | ORAL | Status: AC
  Administered 2019-06-17: 15:00:00 5 mg via ORAL
  Filled 2019-06-17: qty 1

## 2019-06-17 MED ORDER — EZETIMIBE 10 MG PO TABS
10.0000 mg | ORAL_TABLET | Freq: Every day | ORAL | Status: DC
  Administered 2019-06-17 – 2019-06-20 (×4): 10 mg via ORAL
  Filled 2019-06-17 (×4): qty 1

## 2019-06-17 MED ORDER — METOPROLOL TARTRATE 25 MG PO TABS
25.0000 mg | ORAL_TABLET | Freq: Four times a day (QID) | ORAL | Status: DC
  Administered 2019-06-17 – 2019-06-19 (×8): 25 mg via ORAL
  Filled 2019-06-17 (×9): qty 1

## 2019-06-17 NOTE — Progress Notes (Signed)
Cardiology Progress Note  Patient ID: [name] MRN: [id number] DOB: 1932-04-10 Date of Encounter: 06/17/2019  Primary Cardiologist: No primary care provider on file.  Subjective  A. fib with RVR today.  She is on no AV nodal agents.  Reports she has no chest pain or shortness of breath.  Moving all extremities.  No major deficits seen on my examination.  Plans for TEE on Monday.  ROS:  All other ROS reviewed and negative. Pertinent positives noted in the HPI.     Inpatient Medications  Scheduled Meds: . aspirin  300 mg Rectal Daily   Or  . aspirin  325 mg Oral Daily  . atorvastatin  40 mg Oral q1800  . levothyroxine  25 mcg Oral QAC breakfast  . warfarin  5 mg Oral ONCE-1600  . Warfarin - Pharmacist Dosing Inpatient   Does not apply q1800   Continuous Infusions: . sodium chloride 50 mL/hr at 06/16/19 1804   PRN Meds: acetaminophen **OR** acetaminophen (TYLENOL) oral liquid 160 mg/5 mL **OR** acetaminophen, albuterol, diphenhydrAMINE, polyvinyl alcohol, senna-docusate   Vital Signs   Vitals:   06/16/19 2325 06/17/19 0336 06/17/19 0800 06/17/19 1200  BP:  122/78 (!) 130/100 (!) 145/95  Pulse:  (!) 105 (!) 121 (!) 120  Resp:  18 18 20   Temp: 98.1 F (36.7 C) 98.2 F (36.8 C) 98.3 F (36.8 C)   TempSrc: Oral Oral Oral   SpO2: 98% 95% 97% 98%  Weight:      Height:        Intake/Output Summary (Last 24 hours) at 06/17/2019 1432 Last data filed at 06/17/2019 1200 Gross per 24 hour  Intake 1724.77 ml  Output --  Net 1724.77 ml   Last 3 Weights 06/16/2019 05/26/2019 01/26/2019  Weight (lbs) 182 lb 194 lb 187 lb 9.6 oz  Weight (kg) 82.555 kg 87.998 kg 85.095 kg      Telemetry  Overnight telemetry shows atrial fibrillation with RVR, heart rate up to 160, which I personally reviewed.   ECG  The most recent ECG shows atrial fibrillation, heart rate 83, which I personally reviewed.   Physical Exam   Vitals:   06/16/19 2325 06/17/19 0336 06/17/19 0800 06/17/19 1200   BP:  122/78 (!) 130/100 (!) 145/95  Pulse:  (!) 105 (!) 121 (!) 120  Resp:  18 18 20   Temp: 98.1 F (36.7 C) 98.2 F (36.8 C) 98.3 F (36.8 C)   TempSrc: Oral Oral Oral   SpO2: 98% 95% 97% 98%  Weight:      Height:         Intake/Output Summary (Last 24 hours) at 06/17/2019 1432 Last data filed at 06/17/2019 1200 Gross per 24 hour  Intake 1724.77 ml  Output --  Net 1724.77 ml    Last 3 Weights 06/16/2019 05/26/2019 01/26/2019  Weight (lbs) 182 lb 194 lb 187 lb 9.6 oz  Weight (kg) 82.555 kg 87.998 kg 85.095 kg    Body mass index is 30.29 kg/m.  General: Well nourished, well developed, in no acute distress Head: Atraumatic, normal size  Eyes: PEERLA, EOMI  Neck: Supple, no JVD Endocrine: No thryomegaly Cardiac: Irregular rhythm, tachycardia noted Lungs: Clear to auscultation bilaterally, no wheezing, rhonchi or rales  Abd: Soft, nontender, no hepatomegaly  Ext: No edema, pulses 2+ Musculoskeletal: No deformities, BUE and BLE strength normal and equal Skin: Warm and dry, no rashes   Neuro: Alert and oriented to person, place, time, and situation, CNII-XII grossly intact, no focal  deficits  Psych: Normal mood and affect   Labs  High Sensitivity Troponin:  No results for input(s): TROPONINIHS in the last 720 hours.   Cardiac EnzymesNo results for input(s): TROPONINI in the last 168 hours. No results for input(s): TROPIPOC in the last 168 hours.  Chemistry Recent Labs  Lab 06/16/19 0910 06/16/19 0934  NA 141 142  K 4.2 4.0  CL 106 106  CO2 23  --   GLUCOSE 104* 98  BUN 14 15  CREATININE 0.78 0.70  CALCIUM 9.0  --   PROT 6.8  --   ALBUMIN 3.5  --   AST 26  --   ALT 19  --   ALKPHOS 40  --   BILITOT 0.9  --   GFRNONAA >60  --   GFRAA >60  --   ANIONGAP 12  --     Hematology Recent Labs  Lab 06/16/19 0910 06/16/19 0934  WBC 4.5  --   RBC 4.37  --   HGB 12.4 12.2  HCT 39.0 36.0  MCV 89.2  --   MCH 28.4  --   MCHC 31.8  --   RDW 14.6  --   PLT 147*  --     BNPNo results for input(s): BNP, PROBNP in the last 168 hours.  DDimer No results for input(s): DDIMER in the last 168 hours.   Radiology  CT Code Stroke CTA Head W/WO contrast  Result Date: 06/16/2019 CLINICAL DATA:  Focal neuro deficit, greater than 6 hours, stroke suspected. EXAM: CT ANGIOGRAPHY HEAD AND NECK CT PERFUSION BRAIN TECHNIQUE: Multidetector CT imaging of the head and neck was performed using the standard protocol during bolus administration of intravenous contrast. Multiplanar CT image reconstructions and MIPs were obtained to evaluate the vascular anatomy. Carotid stenosis measurements (when applicable) are obtained utilizing NASCET criteria, using the distal internal carotid diameter as the denominator. Multiphase CT imaging of the brain was performed following IV bolus contrast injection. Subsequent parametric perfusion maps were calculated using RAPID software. CONTRAST:  Administered contrast not known at this time. COMPARISON:  Concurrently performed noncontrast head CT 06/16/2019, CTA head/neck 06/13/2009, MRA neck 03/29/2006 FINDINGS: CTA NECK FINDINGS Aortic arch: Standard aortic branching. Atherosclerotic plaque within the visualized aortic arch and proximal major branch vessels of the neck. No significant innominate or proximal subclavian artery stenosis. Right carotid system: CCA and ICA patent within the neck without stenosis. Mild calcified plaque within the proximal ICA. Redemonstrated tortuosity of the ICA. Left carotid system: CCA and ICA patent within the neck without stenosis. Mild calcified plaque within the proximal ICA. Vertebral arteries: The vertebral arteries are codominant and patent within the neck. Mixed plaque at the origin of the left vertebral artery results in mild ostial stenosis. Skeleton: No acute bony abnormality or aggressive osseous lesion. C5-C6 spondylosis with moderate disc height loss, posterior disc osteophyte and uncovertebral hypertrophy. Other  neck: No neck mass or cervical lymphadenopathy. Upper chest: No consolidation within the imaged lung apices. Mild interlobular septal thickening within the visualized lung apices is nonspecific, but may reflect mild edema. Prior median sternotomy. Redemonstrated calcified mediastinal lymph nodes. Review of the MIP images confirms the above findings CTA HEAD FINDINGS Anterior circulation: The intracranial internal carotid arteries are patent without significant stenosis. The M1 middle cerebral arteries are patent without significant stenosis. Question occlusion of a mid to distal right M2 MCA branch vessel (see annotations on series 9, images 67 through 71) (series 12, image 15). The anterior cerebral arteries are  patent without significant proximal stenosis. No intracranial aneurysm is identified. Posterior circulation: The intracranial vertebral arteries are patent. Mild atherosclerotic narrowing of the V4 right vertebral artery beyond the origin of the right PICA. The basilar artery is patent without significant stenosis. The posterior cerebral arteries are patent without significant proximal stenosis. Atherosclerotic irregularity of the distal right posterior cerebral arteries bilaterally. There is the left P1 segment is hypoplastic. There is a sizable left posterior communicating artery. No definite right posterior communicating artery is identified. Venous sinuses: Within limitations of contrast timing, no convincing thrombus. Anatomic variants: As described Review of the MIP images confirms the above findings CT Brain Perfusion Findings: ASPECTS: 10 CBF (<30%) Volume: 90mL Perfusion (Tmax>6.0s) volume: 65mL (within the right parietal lobe) Mismatch Volume: 3mL Infarction Location:None identified These results were called by telephone at the time of interpretation on 06/16/2019 at 9:52 am to provider Dr. Rory Percy, who verbally acknowledged these results. IMPRESSION: CTA neck: 1. The bilateral common and internal  carotid arteries are patent within the neck without significant stenosis. Mild calcified plaque within the proximal internal carotid arteries. 2. The bilateral vertebral arteries are patent within the neck. Mixed plaque results in mild stenosis at the origin of the left vertebral artery. CTA head: 1. Possible occlusion of a mid to distal right M2 MCA branch vessel as described. 2. Mild atherosclerotic narrowing of the V4 right vertebral artery. 3. Atherosclerotic irregularity of the posterior cerebral arteries bilaterally. CT perfusion head: The perfusion software identifies a 12 mL region of critically hypoperfused parenchyma within the right parietal lobe utilizing the Tmax>6 seconds threshold. No core infarct is identified by the perfusion software. Reported mismatch volume: 12 mL. Electronically Signed   By: Kellie Simmering DO   On: 06/16/2019 10:10   CT Code Stroke CTA Neck W/WO contrast  Result Date: 06/16/2019 CLINICAL DATA:  Focal neuro deficit, greater than 6 hours, stroke suspected. EXAM: CT ANGIOGRAPHY HEAD AND NECK CT PERFUSION BRAIN TECHNIQUE: Multidetector CT imaging of the head and neck was performed using the standard protocol during bolus administration of intravenous contrast. Multiplanar CT image reconstructions and MIPs were obtained to evaluate the vascular anatomy. Carotid stenosis measurements (when applicable) are obtained utilizing NASCET criteria, using the distal internal carotid diameter as the denominator. Multiphase CT imaging of the brain was performed following IV bolus contrast injection. Subsequent parametric perfusion maps were calculated using RAPID software. CONTRAST:  Administered contrast not known at this time. COMPARISON:  Concurrently performed noncontrast head CT 06/16/2019, CTA head/neck 06/13/2009, MRA neck 03/29/2006 FINDINGS: CTA NECK FINDINGS Aortic arch: Standard aortic branching. Atherosclerotic plaque within the visualized aortic arch and proximal major branch  vessels of the neck. No significant innominate or proximal subclavian artery stenosis. Right carotid system: CCA and ICA patent within the neck without stenosis. Mild calcified plaque within the proximal ICA. Redemonstrated tortuosity of the ICA. Left carotid system: CCA and ICA patent within the neck without stenosis. Mild calcified plaque within the proximal ICA. Vertebral arteries: The vertebral arteries are codominant and patent within the neck. Mixed plaque at the origin of the left vertebral artery results in mild ostial stenosis. Skeleton: No acute bony abnormality or aggressive osseous lesion. C5-C6 spondylosis with moderate disc height loss, posterior disc osteophyte and uncovertebral hypertrophy. Other neck: No neck mass or cervical lymphadenopathy. Upper chest: No consolidation within the imaged lung apices. Mild interlobular septal thickening within the visualized lung apices is nonspecific, but may reflect mild edema. Prior median sternotomy. Redemonstrated calcified mediastinal lymph nodes. Review  of the MIP images confirms the above findings CTA HEAD FINDINGS Anterior circulation: The intracranial internal carotid arteries are patent without significant stenosis. The M1 middle cerebral arteries are patent without significant stenosis. Question occlusion of a mid to distal right M2 MCA branch vessel (see annotations on series 9, images 67 through 71) (series 12, image 15). The anterior cerebral arteries are patent without significant proximal stenosis. No intracranial aneurysm is identified. Posterior circulation: The intracranial vertebral arteries are patent. Mild atherosclerotic narrowing of the V4 right vertebral artery beyond the origin of the right PICA. The basilar artery is patent without significant stenosis. The posterior cerebral arteries are patent without significant proximal stenosis. Atherosclerotic irregularity of the distal right posterior cerebral arteries bilaterally. There is the  left P1 segment is hypoplastic. There is a sizable left posterior communicating artery. No definite right posterior communicating artery is identified. Venous sinuses: Within limitations of contrast timing, no convincing thrombus. Anatomic variants: As described Review of the MIP images confirms the above findings CT Brain Perfusion Findings: ASPECTS: 10 CBF (<30%) Volume: 44mL Perfusion (Tmax>6.0s) volume: 21mL (within the right parietal lobe) Mismatch Volume: 20mL Infarction Location:None identified These results were called by telephone at the time of interpretation on 06/16/2019 at 9:52 am to provider Dr. Rory Percy, who verbally acknowledged these results. IMPRESSION: CTA neck: 1. The bilateral common and internal carotid arteries are patent within the neck without significant stenosis. Mild calcified plaque within the proximal internal carotid arteries. 2. The bilateral vertebral arteries are patent within the neck. Mixed plaque results in mild stenosis at the origin of the left vertebral artery. CTA head: 1. Possible occlusion of a mid to distal right M2 MCA branch vessel as described. 2. Mild atherosclerotic narrowing of the V4 right vertebral artery. 3. Atherosclerotic irregularity of the posterior cerebral arteries bilaterally. CT perfusion head: The perfusion software identifies a 12 mL region of critically hypoperfused parenchyma within the right parietal lobe utilizing the Tmax>6 seconds threshold. No core infarct is identified by the perfusion software. Reported mismatch volume: 12 mL. Electronically Signed   By: Kellie Simmering DO   On: 06/16/2019 10:10   MR BRAIN WO CONTRAST  Result Date: 06/16/2019 CLINICAL DATA:  86 year old female with atrial fibrillation on warfarin. Code stroke presentation earlier today. Possible right M3 branch occlusion on CTA/CTP today. Not a candidate for tPA. EXAM: MRI HEAD WITHOUT CONTRAST TECHNIQUE: Multiplanar, multiecho pulse sequences of the brain and surrounding structures  were obtained without intravenous contrast. COMPARISON:  CTA and CTP earlier today. Brain MRI 11/21/2018 and earlier. FINDINGS: Brain: Confluent 3-4 cm area of mostly cortical restricted diffusion in the anterior right parietal lobe, post sensory and with some sensory strip involvement suspected (series 3, image 35). This nicely corresponds to the T-max abnormality on CTP earlier today. There is a focus of petechial hemorrhage, or less likely distal right MCA thrombus identified by SWI on series 8, image 65. No other associated blood products. No mass effect. No other restricted diffusion. A few scattered chronic microhemorrhages elsewhere in the brain are stable from last year, primarily in the posterior temporal and occipital lobes. Stable scattered and patchy bilateral white matter T2 and FLAIR hyperintensity. 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 No midline shift, mass effect, evidence of mass lesion, ventriculomegaly, extra-axial collection or acute intracranial hemorrhage. Cervicomedullary junction and pituitary are within normal limits. Vascular: Major intracranial vascular flow voids are stable since last year. But there is a small posterior right MCA branch visible on FLAIR today (  series 7, image 13). Skull and upper cervical spine: Normal for age visible cervical spine. Hyperostosis of the calvarium. Normal bone marrow signal. Sinuses/Orbits: Stable, negative. Other: Mastoids remain clear. Visible internal auditory structures appear normal. IMPRESSION: 1. 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 2. Solitary focus of petechial hemorrhage versus distal vessel thrombus, but no malignant hemorrhagic transformation or mass effect. 3. Otherwise stable chronic small vessel disease. Electronically Signed   By: Genevie Ann M.D.   On: 06/16/2019 16:19   CT Code Stroke Cerebral Perfusion with contrast  Result Date: 06/16/2019 CLINICAL  DATA:  Focal neuro deficit, greater than 6 hours, stroke suspected. EXAM: CT ANGIOGRAPHY HEAD AND NECK CT PERFUSION BRAIN TECHNIQUE: Multidetector CT imaging of the head and neck was performed using the standard protocol during bolus administration of intravenous contrast. Multiplanar CT image reconstructions and MIPs were obtained to evaluate the vascular anatomy. Carotid stenosis measurements (when applicable) are obtained utilizing NASCET criteria, using the distal internal carotid diameter as the denominator. Multiphase CT imaging of the brain was performed following IV bolus contrast injection. Subsequent parametric perfusion maps were calculated using RAPID software. CONTRAST:  Administered contrast not known at this time. COMPARISON:  Concurrently performed noncontrast head CT 06/16/2019, CTA head/neck 06/13/2009, MRA neck 03/29/2006 FINDINGS: CTA NECK FINDINGS Aortic arch: Standard aortic branching. Atherosclerotic plaque within the visualized aortic arch and proximal major branch vessels of the neck. No significant innominate or proximal subclavian artery stenosis. Right carotid system: CCA and ICA patent within the neck without stenosis. Mild calcified plaque within the proximal ICA. Redemonstrated tortuosity of the ICA. Left carotid system: CCA and ICA patent within the neck without stenosis. Mild calcified plaque within the proximal ICA. Vertebral arteries: The vertebral arteries are codominant and patent within the neck. Mixed plaque at the origin of the left vertebral artery results in mild ostial stenosis. Skeleton: No acute bony abnormality or aggressive osseous lesion. C5-C6 spondylosis with moderate disc height loss, posterior disc osteophyte and uncovertebral hypertrophy. Other neck: No neck mass or cervical lymphadenopathy. Upper chest: No consolidation within the imaged lung apices. Mild interlobular septal thickening within the visualized lung apices is nonspecific, but may reflect mild edema.  Prior median sternotomy. Redemonstrated calcified mediastinal lymph nodes. Review of the MIP images confirms the above findings CTA HEAD FINDINGS Anterior circulation: The intracranial internal carotid arteries are patent without significant stenosis. The M1 middle cerebral arteries are patent without significant stenosis. Question occlusion of a mid to distal right M2 MCA branch vessel (see annotations on series 9, images 67 through 71) (series 12, image 15). The anterior cerebral arteries are patent without significant proximal stenosis. No intracranial aneurysm is identified. Posterior circulation: The intracranial vertebral arteries are patent. Mild atherosclerotic narrowing of the V4 right vertebral artery beyond the origin of the right PICA. The basilar artery is patent without significant stenosis. The posterior cerebral arteries are patent without significant proximal stenosis. Atherosclerotic irregularity of the distal right posterior cerebral arteries bilaterally. There is the left P1 segment is hypoplastic. There is a sizable left posterior communicating artery. No definite right posterior communicating artery is identified. Venous sinuses: Within limitations of contrast timing, no convincing thrombus. Anatomic variants: As described Review of the MIP images confirms the above findings CT Brain Perfusion Findings: ASPECTS: 10 CBF (<30%) Volume: 19mL Perfusion (Tmax>6.0s) volume: 30mL (within the right parietal lobe) Mismatch Volume: 5mL Infarction Location:None identified These results were called by telephone at the time of  interpretation on 06/16/2019 at 9:52 am to provider Dr. Rory Percy, who verbally acknowledged these results. IMPRESSION: CTA neck: 1. The bilateral common and internal carotid arteries are patent within the neck without significant stenosis. Mild calcified plaque within the proximal internal carotid arteries. 2. The bilateral vertebral arteries are patent within the neck. Mixed plaque  results in mild stenosis at the origin of the left vertebral artery. CTA head: 1. Possible occlusion of a mid to distal right M2 MCA branch vessel as described. 2. Mild atherosclerotic narrowing of the V4 right vertebral artery. 3. Atherosclerotic irregularity of the posterior cerebral arteries bilaterally. CT perfusion head: The perfusion software identifies a 12 mL region of critically hypoperfused parenchyma within the right parietal lobe utilizing the Tmax>6 seconds threshold. No core infarct is identified by the perfusion software. Reported mismatch volume: 12 mL. Electronically Signed   By: Kellie Simmering DO   On: 06/16/2019 10:10   ECHOCARDIOGRAM COMPLETE  Result Date: 06/16/2019    ECHOCARDIOGRAM REPORT   Patient Name:   [name] Date of Exam: 06/16/2019 Medical Rec #:  [id number]     Height:       65.0 in Accession #:    [id number]    Weight:       182.0 lb Date of Birth:  [birth year]      BSA:          1.900 m Patient Age:    28 years      BP:           140/77 mmHg Patient Gender: F             HR:           83 bpm. Exam Location:  Inpatient Procedure: 2D Echo                                MODIFIED REPORT: This report was modified by Eleonore Chiquito MD on 06/16/2019 due to updated RVSP.  Indications:     Stroke I163.9  History:         Patient has prior history of Echocardiogram examinations, most                  recent 12/01/2016. Arrythmias:Atrial Fibrillation; Risk                  Factors:Dyslipidemia.                   Mitral Valve: prosthetic annuloplasty ring valve is present in                  the mitral position. Procedure Date: 10/23/1998.  Sonographer:     Mikki Santee RDCS (AE) Referring Phys:  [place] Diagnosing Phys: Eleonore Chiquito MD IMPRESSIONS  1. Left ventricular ejection fraction, by estimation, is 55 to 60%. The left ventricle has normal function. The left ventricle has no regional wall motion abnormalities. Left ventricular diastolic function could not be evaluated.   2. Right ventricular systolic function is normal. The right ventricular size is normal. There is mildly elevated pulmonary artery systolic pressure. The estimated right ventricular systolic pressure is [id number] mmHg.  3. Left atrial size was severely dilated.  4. A mitral valve annuloplasty ring is present. There is a heavily calcified focal mass-like structure present on the PMVL. This is likely deterioration of the PMVL with mitral annular calcification. There are no mobile components to it.  There is no apparent destruction of the valve. There is mild to moderate MR. There is mild mitral stenosis. This is best viewed in the PLAX. When compared with the prior echo this was present but not specifically commented on. The mitral valve has been repaired/replaced. Mild to moderate mitral valve regurgitation. The mean mitral valve gradient is 6.3 mmHg with average heart rate of 80 bpm. There is a prosthetic annuloplasty ring present in the mitral position. Procedure Date: 10/23/1998.  5. The aortic valve is tricuspid. Aortic valve regurgitation is not visualized. Mild aortic valve sclerosis is present, with no evidence of aortic valve stenosis.  6. The inferior vena cava is dilated in size with <50% respiratory variability, suggesting right atrial pressure of 15 mmHg. Comparison(s): A prior study was performed on 12/01/2016. No significant change from prior study. FINDINGS  Left Ventricle: Left ventricular ejection fraction, by estimation, is 55 to 60%. The left ventricle has normal function. The left ventricle has no regional wall motion abnormalities. The left ventricular internal cavity size was normal in size. There is  no left ventricular hypertrophy. Left ventricular diastolic function could not be evaluated due to mitral valve repair. Left ventricular diastolic function could not be evaluated. Right Ventricle: The right ventricular size is normal. No increase in right ventricular wall thickness. Right ventricular  systolic function is normal. There is mildly elevated pulmonary artery systolic pressure. The tricuspid regurgitant velocity is 2.35  m/s, and with an assumed right atrial pressure of 15 mmHg, the estimated right ventricular systolic pressure is [id number] mmHg. Left Atrium: Left atrial size was severely dilated. Right Atrium: Right atrial size was normal in size. Pericardium: There is no evidence of pericardial effusion. Presence of pericardial fat pad. Mitral Valve: A mitral valve annuloplasty ring is present. There is a heavily calcified focal mass-like structure present on the PMVL. This is likely deterioration of the PMVL with mitral annular calcification. There are no mobile components to it. There  is no apparent destruction of the valve. There is mild to moderate MR. There is mild mitral stenosis. This is best viewed in the PLAX. When compared with the prior echo this was present but not specifically commented on. The mitral valve has been repaired/replaced. Mild to moderate mitral valve regurgitation. There is a prosthetic annuloplasty ring present in the mitral position. Procedure Date: 10/23/1998. MV peak gradient, 16.2 mmHg. The mean mitral valve gradient is 6.3 mmHg with average heart rate of 80 bpm. Tricuspid Valve: The tricuspid valve is grossly normal. Tricuspid valve regurgitation is not demonstrated. No evidence of tricuspid stenosis. Aortic Valve: The aortic valve is tricuspid. Aortic valve regurgitation is not visualized. Mild aortic valve sclerosis is present, with no evidence of aortic valve stenosis. Pulmonic Valve: The pulmonic valve was grossly normal. Pulmonic valve regurgitation is trivial. No evidence of pulmonic stenosis. Aorta: The aortic root is normal in size and structure. Venous: The inferior vena cava is dilated in size with less than 50% respiratory variability, suggesting right atrial pressure of 15 mmHg. IAS/Shunts: The atrial septum is grossly normal. EKG: Rhythm strip during this  exam demostrated atrial fibrillation.  LEFT VENTRICLE PLAX 2D LVIDd:         4.20 cm LVIDs:         3.10 cm LV PW:         1.00 cm LV IVS:        1.00 cm LVOT diam:     2.00 cm LV SV:  42 LV SV Index:   22 LVOT Area:     3.14 cm  RIGHT VENTRICLE RV S prime:     9.24 cm/s TAPSE (M-mode): 1.5 cm LEFT ATRIUM              Index       RIGHT ATRIUM           Index LA diam:        4.70 cm  2.47 cm/m  RA Area:     19.60 cm LA Vol (A2C):   110.0 ml 57.88 ml/m RA Volume:   46.30 ml  24.36 ml/m LA Vol (A4C):   113.0 ml 59.46 ml/m LA Biplane Vol: 115.0 ml 60.51 ml/m  AORTIC VALVE LVOT Vmax:   70.23 cm/s LVOT Vmean:  47.567 cm/s LVOT VTI:    0.133 m  AORTA Ao Root diam: 3.00 cm MITRAL VALVE              TRICUSPID VALVE MV Area (PHT): 2.97 cm   TR Peak grad:   22.1 mmHg MV Peak grad:  16.2 mmHg  TR Vmax:        235.00 cm/s MV Mean grad:  6.3 mmHg MV Vmax:       2.01 m/s   SHUNTS MV Vmean:      112.5 cm/s Systemic VTI:  0.13 m MR Peak grad: 116.6 mmHg  Systemic Diam: 2.00 cm MR Vmax:      540.00 cm/s Eleonore Chiquito MD Electronically signed by Eleonore Chiquito MD Signature Date/Time: 06/16/2019/4:43:21 PM    Final (Updated)    CT HEAD CODE STROKE WO CONTRAST  Result Date: 06/16/2019 CLINICAL DATA:  Code stroke. Neuro deficit, acute, stroke suspected. Additional history provided: Last known normal 5 a.m., patient awoke and felt as if her left hand did not along to her. EXAM: CT HEAD WITHOUT CONTRAST TECHNIQUE: Contiguous axial images were obtained from the base of the skull through the vertex without intravenous contrast. COMPARISON:  Brain MRI 11/21/2018, head CT 04/12/2018 FINDINGS: Brain: There is no evidence of acute intracranial hemorrhage, intracranial mass, midline shift or extra-axial fluid collection.No demarcated cortical infarction. Mild ill-defined hypoattenuation within the cerebral white matter is nonspecific, but consistent with chronic small vessel ischemic disease and stable from prior exams. Stable,  mild generalized parenchymal atrophy. 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 Vascular: No hyperdense vessel.  Atherosclerotic calcifications. Skull: Normal. Negative for fracture or focal lesion. Sinuses/Orbits: Visualized orbits demonstrate no acute abnormality. Mild ethmoid sinus mucosal thickening. No significant mastoid effusion. These results were communicated to Dr. Rory Percy At 9:37 amon 3/26/2021by text page via the [facility] messaging system. IMPRESSION: 1. No evidence of acute intracranial abnormality. 2. 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 3. Stable generalized parenchymal atrophy and chronic small vessel ischemic disease. 4. Mild ethmoid sinus mucosal thickening. Electronically Signed   By: Kellie Simmering DO   On: 06/16/2019 09:38    Cardiac Studies  TTE 06/16/2019 1. Left ventricular ejection fraction, by estimation, is 55 to 60%. The  left ventricle has normal function. The left ventricle has no regional  wall motion abnormalities. Left ventricular diastolic function could not  be evaluated.  2. Right ventricular systolic function is normal. The right ventricular  size is normal. There is mildly elevated pulmonary artery systolic  pressure. The estimated right ventricular systolic pressure is [id number] mmHg.  3. Left atrial size was severely dilated.  4. A mitral valve annuloplasty ring  is present. There is a heavily  calcified focal mass-like structure present on the PMVL. This is likely  deterioration of the PMVL with mitral annular calcification. There are no  mobile components to it. There is no  apparent destruction of the valve. There is mild to moderate MR. There is  mild mitral stenosis. This is best viewed in the PLAX. When compared with  the prior echo this was present but not specifically commented on. The  mitral valve has been  repaired/replaced. Mild to moderate mitral valve  regurgitation. The mean  mitral valve gradient is 6.3 mmHg with average heart rate of 80 bpm. There  is a prosthetic annuloplasty ring present in the mitral position.  Procedure Date: 10/23/1998.  5. The aortic valve is tricuspid. Aortic valve regurgitation is not  visualized. Mild aortic valve sclerosis is present, with no evidence of  aortic valve stenosis.  6. The inferior vena cava is dilated in size with <50% respiratory  variability, suggesting right atrial pressure of 15 mmHg.   Patient Profile  [name] is a 86 y.o. female with history of mitral valve repair, permanent atrial fibrillation with difficult to control rates who was admitted on 06/16/2019 for acute right MCA stroke.  Subsequently found to have a calcified mass on the posterior mitral valve leaflet.  Assessment & Plan   1.  Acute stroke with mitral valve mass -Appears to have a large calcified mass on the posterior mitral valve leaflet.  Could be calcifications related to prior mitral valve repair.  Given recent stroke we will proceed with a TEE on Monday. -She is on Coumadin.  INR was subtherapeutic on admission.  She apparently is been on this for years.  She is never had a discussion about switching agents.  I will recommend continue her Coumadin with a goal of 2-3.  She can discuss the possibility of a DOAC with her outpatient cardiologist.  May be better given her subtherapeutic INRs.  2.  Permanent atrial fibrillation -We will add that metoprolol 25 mg every 6 hours for better rate control.  Her rates are up in the 160s.  Echocardiogram shows normal LVEF. -She reports she is now in should an AV nodal ablation and pacer placement.  We will have her follow-up with her electrophysiologist about this.  Should she run into bradycardic issues or we have issues here in house we will have electrophysiology see her.  For now the plan will be to get her home to discuss this procedure as an outpatient.  3. HLD -Reportedly  statin intolerant.  She should be considered for PCSK9 inhibitors as outpatient.  I will add Zetia while she is here.  For questions or updates, please contact [place] Please consult [email or web address] for contact info under   Time Spent with Patient: I have spent a total of 35 minutes with patient reviewing hospital notes, telemetry, EKGs, labs and examining the patient as well as establishing an assessment and plan that was discussed with the patient.  > 50% of time was spent in direct patient care.    Signed, Addison Naegeli. Audie Box, [place]  06/17/2019 2:32 PM

## 2019-06-17 NOTE — Evaluation (Addendum)
Occupational Therapy Evaluation Patient Details Name: [name] MRN: [id number] DOB: Jan 24, 1933 Today's Date: 06/17/2019    History of Present Illness 86 y.o. female with medical history significant of s/p MVR; hypothyroidism; HLD; TIA x 2; chronic diastolic CHF; afib, and mild carotid stenosis. Presenting with concern for CVA.   Clinical Impression   This 86 y/o female presents with the above. PTA pt reports independence with ADL, iADL and functional mobility. Pt was up in bathroom upon arrival to room completing toileting task; while washing hands at sink post toileting noted pt with significantly elevated HR (up to 150s/160) with standing activity, therefore deferred additional standing activity and had pt return to sitting EOB/supine (RN made aware of elevated HR). Once seated HR decreasing to 120s/130s (fluctuating) and with return to supine HR decreasing to 110s, pt asymptomatic throughout. Overall pt completing ADL tasks and room level mobility at supervision level without AD. Pt does present with impaired fine motor/coordination of dominant LUE which impacts her ability to easily perform ADL/functional tasks. Pt reports son lives in guest apartment next to her home. She will benefit from continued acute OT services and given current deficits recommend neuro outpatient OT services to further address LUE deficits and to progress pt's overall independence with ADL/mobility. Will follow.     Follow Up Recommendations  Outpatient OT(outpt neuro, pending progress/improvement)    Equipment Recommendations  None recommended by OT           Precautions / Restrictions Precautions Precautions: Fall Precaution Comments: watch HR  Restrictions Weight Bearing Restrictions: No      Mobility Bed Mobility Overal bed mobility: Independent                Transfers Overall transfer level: Modified independent Equipment used: None                  Balance Overall balance  assessment: No apparent balance deficits (not formally assessed)                                       ADL either performed or assessed with clinical judgement   ADL Overall ADL's : Needs assistance/impaired Eating/Feeding: Independent;Sitting   Grooming: Wash/dry hands;Supervision/safety;Standing   Upper Body Bathing: Set up;Sitting   Lower Body Bathing: Supervison/ safety;Sit to/from stand   Upper Body Dressing : Sitting;Modified independent   Lower Body Dressing: Supervision/safety;Sit to/from stand   Toilet Transfer: Supervision/safety;Ambulation Toilet Transfer Details (indicate cue type and reason): pt seated on toilet in bathroom upon arrival to room Toileting- Clothing Manipulation and Hygiene: Supervision/safety;Sit to/from stand Toileting - Clothing Manipulation Details (indicate cue type and reason): pt performing pericare/clothing management with supervision for safety     Functional mobility during ADLs: Supervision/safety General ADL Comments: pt seated in bathroom for toileting upon arrival, while standing at sink to wash hands noted pt with sig elevated HR (up to 150s/160s), therefore had pt transition to sitting EOB and to supine (deferred further standing activity); RN aware      Vision Baseline Vision/History: Wears glasses Wears Glasses: Reading only       Perception     Praxis      Pertinent Vitals/Pain Pain Assessment: No/denies pain     Hand Dominance Left   Extremity/Trunk Assessment Upper Extremity Assessment Upper Extremity Assessment: LUE deficits/detail LUE Deficits / Details: decreased coordination noted, more notable as pt is L hand dominant  LUE  Sensation: (denies sensation changes ) LUE Coordination: decreased fine motor   Lower Extremity Assessment Lower Extremity Assessment: Defer to PT evaluation;Overall WFL for tasks assessed       Communication Communication Communication: No difficulties   Cognition  Arousal/Alertness: Awake/alert Behavior During Therapy: WFL for tasks assessed/performed Overall Cognitive Status: Within Functional Limits for tasks assessed                                 General Comments: WFL for basic tasks, though pt acknowledges/reports she wouldn't drive currently as she feels like her thinking wouldn't be quick enough to respond in a high traffic situation - will continue to assess    General Comments  HR at 105 resting, elevated briefly to 130s in seated position but decreased to 110s. Upon standing and static balance testing HR varied 130s-150s. Asymptomatic. Deferred gait assessment at this time due to elevated HR with minimal activity.    Exercises     Shoulder Instructions      Home Living Family/patient expects to be discharged to:: Private residence Living Arrangements: Children(son lives in guest apartment) Available Help at Discharge: Family Type of Home: House Home Access: Level entry     [place]: One level     Bathroom Shower/Tub: Occupational psychologist: (comfort height)     Home Equipment: Environmental consultant - 2 wheels          Prior Functioning/Environment Level of Independence: Independent                 OT Problem List: Impaired UE functional use;Decreased strength;Decreased coordination;Decreased cognition      OT Treatment/Interventions: Self-care/ADL training;Therapeutic exercise;Neuromuscular education;Energy conservation;DME and/or AE instruction;Therapeutic activities;Cognitive remediation/compensation;Patient/family education;Balance training    OT Goals(Current goals can be found in the care plan section) Acute Rehab OT Goals Patient Stated Goal: Get well OT Goal Formulation: With patient Time For Goal Achievement: 07/01/19 Potential to Achieve Goals: Good  OT Frequency: Min 2X/week   Barriers to D/C:            Co-evaluation              AM-PAC OT "6 Clicks" Daily Activity      Outcome Measure Help from another person eating meals?: None Help from another person taking care of personal grooming?: None Help from another person toileting, which includes using toliet, bedpan, or urinal?: None Help from another person bathing (including washing, rinsing, drying)?: None Help from another person to put on and taking off regular upper body clothing?: None Help from another person to put on and taking off regular lower body clothing?: A Little 6 Click Score: 23   End of Session Nurse Communication: Mobility status;Other (comment)(elevated HR)  Activity Tolerance: Patient tolerated treatment well Patient left: in bed;with call bell/phone within reach;with bed alarm set  OT Visit Diagnosis: Other symptoms and signs involving the nervous system (R29.898)                Time: 1012-1040 OT Time Calculation (min): 28 min Charges:  OT General Charges $OT Visit: 1 Visit OT Evaluation $OT Eval Moderate Complexity: 1 Mod OT Treatments $Self Care/Home Management : 8-22 mins  Lou Cal, OT Acute Rehabilitation Services Pager [phone] Office [phone]   Raymondo Band 06/17/2019, 1:08 PM

## 2019-06-17 NOTE — Progress Notes (Signed)
Dr. Karleen Hampshire made aware of heart rate in the 170's with ambulation.

## 2019-06-17 NOTE — Progress Notes (Signed)
ANTICOAGULATION CONSULT NOTE - Initial Consult  Pharmacy Consult for Warfarin Indication: atrial fibrillation   Allergies  Allergen Reactions  . Amiodarone Hcl Swelling    SWELLING REACTION UNSPECIFIED   . Penicillins Rash    Has patient had a PCN reaction causing immediate rash, facial/tongue/throat swelling, SOB or lightheadedness with hypotension: No Has patient had a PCN reaction causing severe rash involving mucus membranes or skin necrosis: No Has patient had a PCN reaction that required hospitalization:Patient was inpatient when reaction occurred Has patient had a PCN reaction occurring within the last 10 years: No If all of the above answers are "NO", then may proceed with Cephalosporin use.   . Statins Rash    Patient Measurements: Height: 5\' 5"  (165.1 cm) Weight: 182 lb (82.6 kg) IBW/kg (Calculated) : 57 Heparin Dosing Weight:   Vital Signs: Temp: 98.2 F (36.8 C) (03/27 0336) Temp Source: Oral (03/27 0336) BP: 130/100 (03/27 0800) Pulse Rate: 121 (03/27 0800)  Labs: Recent Labs    06/16/19 0910 06/16/19 0934 06/17/19 0823  HGB 12.4 12.2  --   HCT 39.0 36.0  --   PLT 147*  --   --   APTT 36  --   --   LABPROT 21.7*  --  20.7*  INR 1.9*  --  1.8*  CREATININE 0.78 0.70  --     Estimated Creatinine Clearance: 55.5 mL/min (by C-G formula based on SCr of 0.7 mg/dL).   Medical History: Past Medical History:  Diagnosis Date  . Allergic rhinitis   . 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 Arthritis   . Atrial fibrillation -persistent cardiologist-  dr klein/  primary EP -- dr Tawanna Sat ([facility])   a. s/p PVI [name] 2010;  b. on tikosyn/coumadin;  c. 05/2009 Echo: EF 60-65%, Gr 2 DD. (first dx 09/ 2007)  . Bilateral lower extremity edema   . Bleeding hemorrhoid   . Carotid stenosis    mild (hosp 3/11)- consult by vasc/ Dr Donnetta Hutching  . Complication of anesthesia    hard to wake  . Diverticulosis of colon   . Dyspnea    on exertion-climbing stairs  . Fatty liver    . H/O cardiac radiofrequency ablation    01/ 2008 at [facility] /  03/ 2010  at [facility]  . Heart failure with preserved ejection fraction ([place])   . History of adenomatous polyp of colon    tubular adenoma's  . History of cardiomyopathy    secondary tachycardia-induced cardiomyopathy -- resolved 2014  . History of squamous cell carcinoma in situ (SCCIS) of skin    05/ 2017  nasal bridge and right medial knee  . History of transient ischemic attack (TIA)    01-24-2005 and 06-12-2009  . Hyperlipidemia   . Hypothyroidism   . Mild intermittent asthma    reacts to cats  . Mixed stress and urge urinary incontinence   . Pulmonary nodule   . S/P mitral valve repair 10-23-1998  dr Boyce Medici at [facility]   for MVP and regurg. (annuloplasty ring procedure)    Medications:  Scheduled:  . aspirin  300 mg Rectal Daily   Or  . aspirin  325 mg Oral Daily  . atorvastatin  40 mg Oral q1800  . levothyroxine  25 mcg Oral QAC breakfast  . Warfarin - Pharmacist Dosing Inpatient   Does not apply q1800    Assessment: Patient is a 30 yof that is being admitted for a possible CVA. The patient  has a hx of Afib and MVR. Pharmacy has been asked to dose her home warfarin at this time. The patient's INR goal is 2-3.  PTA Warfarin regimen (5mg  daily except M,F 2.5mg )   INR came back at 1.8 today. We are going to try to shoot for around 2.5 per cards recommendation. Consider reducing ASA to 81mg . No bleeding noted.   Goal of Therapy:  INR 2-3 Monitor platelets by anticoagulation protocol: Yes   Plan:   Coumadin 5mg  PO x1 Daily INR  Onnie Boer, PharmD, BCIDP, AAHIVP, CPP Infectious Disease Pharmacist 06/17/2019 9:53 AM

## 2019-06-17 NOTE — Plan of Care (Signed)
  Problem: Education: Goal: Knowledge of General Education information will improve Description Including pain rating scale, medication(s)/side effects and non-pharmacologic comfort measures Outcome: Progressing   Problem: Education: Goal: Knowledge of General Education information will improve Description Including pain rating scale, medication(s)/side effects and non-pharmacologic comfort measures Outcome: Progressing   

## 2019-06-17 NOTE — Progress Notes (Signed)
PROGRESS NOTE    [name]  TMA:[id number] DOB: 06/12/1932 DOA: 06/16/2019 PCP: Abner Greenspan, MD    Brief Narrative:  86 year old lady with prior history of mitral valve repair, permanent atrial fibrillation on anticoagulation, chronic diastolic heart failure, carotid stenosis, hypothyroidism, hyperlipidemia, TIA presents with tingling face and left upper extremity.  Code stroke was called but patient was out of window for TPA.  She was admitted for evaluation of a stroke.  Initial CT of the head no any acute intracranial abnormality. MRI of the brain shows Acute infarct in the posterior Right MCA territory. Neurology consulted and further work-up was ordered.  Assessment & Plan:   Principal Problem:   CVA (cerebral vascular accident) [facility]) Active Problems:   Hypothyroidism   Hyperlipidemia   Atrial fibrillation ([place])   (HFpEF) heart failure with preserved ejection fraction ([place])   Infarct in the posterior right MCA territory Neurology consulted.  Echocardiogram ordered.  Carotid duplex ordered.  Therapy evaluations and aspirin ordered. Hemoglobin A1c at 5.8 LDL is about 169.  Zetia added by cardiology. Plan for TEE on Monday    Chronic diastolic heart failure Last echocardiogram from 2018 showed preserved left ventricular ejection fraction.  Patient appears to be compensated at this time and repeat echocardiogram ordered.    Chronic atrial fibrillation on Coumadin Patient currently is in A. fib with RVR did not respond to the cough IV Cardizem given earlier this morning.  Patient has been off and on Cardizem and has been intolerant to it. Cardiology started the patient on metoprolol. Plan for AV nodal ablation and pacemaker placement to be scheduled as an outpatient.    Essential hypertension Allow permissive hypertension up to 220/120 mmHg   Hypothyroidism Continue with Synthroid. TSH within normal limits  S/p mitral valve repair On Coumadin for  anticoagulation Subtherapeutic INR    DVT prophylaxis: (Eliquis Code Status: Full code Family Communication: This family at bedside Disposition Plan:  . Patient came from: Home            . Anticipated d/c place: Pending therapy eval . Barriers to d/c OR conditions which need to be met to effect a safe d/c: Pending TEE   Consultants:   Cardiology  Neurology  Procedures: MRI of the brain without contrast Antimicrobials: None  Subjective: No chest pain shortness of breath, nausea, vomiting, abdominal pain Objective: Vitals:   06/17/19 0336 06/17/19 0800 06/17/19 1200 06/17/19 1500  BP: 122/78 (!) 130/100 (!) 145/95   Pulse: (!) 105 (!) 121 (!) 120   Resp: _0 Temp: 98.2 F (36.8 C) 98.3 F (36.8 C)    TempSrc: Oral Oral    SpO2: 95% 97% 98%   Weight:      Height:        Intake/Output Summary (Last 24 hours) at 06/17/2019 1544 Last data filed at 06/17/2019 1500 Gross per 24 hour  Intake 1855.55 ml  Output --  Net 1855.55 ml   Filed Weights   06/16/19 0853  Weight: 82.6 kg    Examination:  General exam: Appears calm and comfortable  Respiratory system: Clear to auscultation. Respiratory effort normal. Cardiovascular system: S1 & S2 heard, irregular, tachycardic, no JVD no pedal edema. Gastrointestinal system: Abdomen is nondistended, soft and nontender Normal bowel sounds heard. Central nervous system: Alert and oriented.  Some tingling in the lower part of the face and in the left upper extremity. Extremities: Symmetric 5 x 5 power. Skin: No rashes, lesions or ulcers Psychiatry:  Mood & affect appropriate.     Data Reviewed: I have personally reviewed following labs and imaging studies  CBC: Recent Labs  Lab 06/16/19 0910 06/16/19 0934  WBC 4.5  --   NEUTROABS 2.8  --   HGB 12.4 12.2  HCT 39.0 36.0  MCV 89.2  --   PLT 147*  --    Basic Metabolic Panel: Recent Labs  Lab 06/16/19 0910 06/16/19 0934  NA 141 142  K 4.2 4.0  CL 106  106  CO2 23  --   GLUCOSE 104* 98  BUN 14 15  CREATININE 0.78 0.70  CALCIUM 9.0  --    GFR: Estimated Creatinine Clearance: 55.5 mL/min (by C-G formula based on SCr of 0.7 mg/dL). Liver Function Tests: Recent Labs  Lab 06/16/19 0910  AST 26  ALT 19  ALKPHOS 40  BILITOT 0.9  PROT 6.8  ALBUMIN 3.5   No results for input(s): LIPASE, AMYLASE in the last 168 hours. No results for input(s): AMMONIA in the last 168 hours. Coagulation Profile: Recent Labs  Lab 06/16/19 0910 06/17/19 0823  INR 1.9* 1.8*   Cardiac Enzymes: No results for input(s): CKTOTAL, CKMB, CKMBINDEX, TROPONINI in the last 168 hours. BNP (last 3 results) No results for input(s): PROBNP in the last 8760 hours. HbA1C: Recent Labs    06/17/19 0432  HGBA1C 5.8*   CBG: Recent Labs  Lab 06/16/19 0842  GLUCAP 94   Lipid Profile: Recent Labs    06/17/19 0432  CHOL 218*  HDL 28*  LDLCALC 169*  TRIG 104  CHOLHDL 7.8   Thyroid Function Tests: Recent Labs    06/16/19 1659  TSH 3.817   Anemia Panel: No results for input(s): VITAMINB12, FOLATE, FERRITIN, TIBC, IRON, RETICCTPCT in the last 72 hours. Sepsis Labs: No results for input(s): PROCALCITON, LATICACIDVEN in the last 168 hours.  Recent Results (from the past 240 hour(s))  SARS CORONAVIRUS 2 (TAT 6-24 HRS) Nasopharyngeal Nasopharyngeal Swab     Status: None   Collection Time: 06/16/19 10:40 AM   Specimen: Nasopharyngeal Swab  Result Value Ref Range Status   SARS Coronavirus 2 NEGATIVE NEGATIVE Final    Comment: (NOTE) SARS-CoV-2 target nucleic acids are NOT DETECTED. The SARS-CoV-2 RNA is generally detectable in upper and lower respiratory specimens during the acute phase of infection. Negative results do not preclude SARS-CoV-2 infection, do not rule out co-infections with other pathogens, and should not be used as the sole basis for treatment or other patient management decisions. Negative results must be combined with clinical  observations, patient history, and epidemiological information. The expected result is Negative. Fact Sheet for Patients: SugarRoll.be Fact Sheet for Healthcare Providers: https://www.woods-mathews.com/ This test is not yet approved or cleared by the [place] FDA and  has been authorized for detection and/or diagnosis of SARS-CoV-2 by FDA under an Emergency Use Authorization (EUA). This EUA will remain  in effect (meaning this test can be used) for the duration of the COVID-19 declaration under Section 56 4(b)(1) of the Act, 21 U.S.C. section 360bbb-3(b)(1), unless the authorization is terminated or revoked sooner. Performed at [facility], [facility] [address] [place], [place] [address]          Radiology Studies: CT Code Stroke CTA Head W/WO contrast  Result Date: 06/16/2019 CLINICAL DATA:  Focal neuro deficit, greater than 6 hours, stroke suspected. EXAM: CT ANGIOGRAPHY HEAD AND NECK CT PERFUSION BRAIN TECHNIQUE: Multidetector CT imaging of the head and neck was performed using the  standard protocol during bolus administration of intravenous contrast. Multiplanar CT image reconstructions and MIPs were obtained to evaluate the vascular anatomy. Carotid stenosis measurements (when applicable) are obtained utilizing NASCET criteria, using the distal internal carotid diameter as the denominator. Multiphase CT imaging of the brain was performed following IV bolus contrast injection. Subsequent parametric perfusion maps were calculated using RAPID software. CONTRAST:  Administered contrast not known at this time. COMPARISON:  Concurrently performed noncontrast head CT 06/16/2019, CTA head/neck 06/13/2009, MRA neck 03/29/2006 FINDINGS: CTA NECK FINDINGS Aortic arch: Standard aortic branching. Atherosclerotic plaque within the visualized aortic arch and proximal major branch vessels of the neck. No significant innominate or proximal subclavian  artery stenosis. Right carotid system: CCA and ICA patent within the neck without stenosis. Mild calcified plaque within the proximal ICA. Redemonstrated tortuosity of the ICA. Left carotid system: CCA and ICA patent within the neck without stenosis. Mild calcified plaque within the proximal ICA. Vertebral arteries: The vertebral arteries are codominant and patent within the neck. Mixed plaque at the origin of the left vertebral artery results in mild ostial stenosis. Skeleton: No acute bony abnormality or aggressive osseous lesion. C5-C6 spondylosis with moderate disc height loss, posterior disc osteophyte and uncovertebral hypertrophy. Other neck: No neck mass or cervical lymphadenopathy. Upper chest: No consolidation within the imaged lung apices. Mild interlobular septal thickening within the visualized lung apices is nonspecific, but may reflect mild edema. Prior median sternotomy. Redemonstrated calcified mediastinal lymph nodes. Review of the MIP images confirms the above findings CTA HEAD FINDINGS Anterior circulation: The intracranial internal carotid arteries are patent without significant stenosis. The M1 middle cerebral arteries are patent without significant stenosis. Question occlusion of a mid to distal right M2 MCA branch vessel (see annotations on series 9, images 67 through 71) (series 12, image 15). The anterior cerebral arteries are patent without significant proximal stenosis. No intracranial aneurysm is identified. Posterior circulation: The intracranial vertebral arteries are patent. Mild atherosclerotic narrowing of the V4 right vertebral artery beyond the origin of the right PICA. The basilar artery is patent without significant stenosis. The posterior cerebral arteries are patent without significant proximal stenosis. Atherosclerotic irregularity of the distal right posterior cerebral arteries bilaterally. There is the left P1 segment is hypoplastic. There is a sizable left posterior  communicating artery. No definite right posterior communicating artery is identified. Venous sinuses: Within limitations of contrast timing, no convincing thrombus. Anatomic variants: As described Review of the MIP images confirms the above findings CT Brain Perfusion Findings: ASPECTS: 10 CBF (<30%) Volume: 41m Perfusion (Tmax>6.0s) volume: 175m(within the right parietal lobe) Mismatch Volume: 1241mnfarction Location:None identified These results were called by telephone at the time of interpretation on 06/16/2019 at 9:52 am to provider Dr. AroRory Percyho verbally acknowledged these results. IMPRESSION: CTA neck: 1. The bilateral common and internal carotid arteries are patent within the neck without significant stenosis. Mild calcified plaque within the proximal internal carotid arteries. 2. The bilateral vertebral arteries are patent within the neck. Mixed plaque results in mild stenosis at the origin of the left vertebral artery. CTA head: 1. Possible occlusion of a mid to distal right M2 MCA branch vessel as described. 2. Mild atherosclerotic narrowing of the V4 right vertebral artery. 3. Atherosclerotic irregularity of the posterior cerebral arteries bilaterally. CT perfusion head: The perfusion software identifies a 12 mL region of critically hypoperfused parenchyma within the right parietal lobe utilizing the Tmax>6 seconds threshold. No core infarct is identified by the perfusion software. Reported mismatch volume:  12 mL. Electronically Signed   By: Kellie Simmering DO   On: 06/16/2019 10:10   CT Code Stroke CTA Neck W/WO contrast  Result Date: 06/16/2019 CLINICAL DATA:  Focal neuro deficit, greater than 6 hours, stroke suspected. EXAM: CT ANGIOGRAPHY HEAD AND NECK CT PERFUSION BRAIN TECHNIQUE: Multidetector CT imaging of the head and neck was performed using the standard protocol during bolus administration of intravenous contrast. Multiplanar CT image reconstructions and MIPs were obtained to evaluate the  vascular anatomy. Carotid stenosis measurements (when applicable) are obtained utilizing NASCET criteria, using the distal internal carotid diameter as the denominator. Multiphase CT imaging of the brain was performed following IV bolus contrast injection. Subsequent parametric perfusion maps were calculated using RAPID software. CONTRAST:  Administered contrast not known at this time. COMPARISON:  Concurrently performed noncontrast head CT 06/16/2019, CTA head/neck 06/13/2009, MRA neck 03/29/2006 FINDINGS: CTA NECK FINDINGS Aortic arch: Standard aortic branching. Atherosclerotic plaque within the visualized aortic arch and proximal major branch vessels of the neck. No significant innominate or proximal subclavian artery stenosis. Right carotid system: CCA and ICA patent within the neck without stenosis. Mild calcified plaque within the proximal ICA. Redemonstrated tortuosity of the ICA. Left carotid system: CCA and ICA patent within the neck without stenosis. Mild calcified plaque within the proximal ICA. Vertebral arteries: The vertebral arteries are codominant and patent within the neck. Mixed plaque at the origin of the left vertebral artery results in mild ostial stenosis. Skeleton: No acute bony abnormality or aggressive osseous lesion. C5-C6 spondylosis with moderate disc height loss, posterior disc osteophyte and uncovertebral hypertrophy. Other neck: No neck mass or cervical lymphadenopathy. Upper chest: No consolidation within the imaged lung apices. Mild interlobular septal thickening within the visualized lung apices is nonspecific, but may reflect mild edema. Prior median sternotomy. Redemonstrated calcified mediastinal lymph nodes. Review of the MIP images confirms the above findings CTA HEAD FINDINGS Anterior circulation: The intracranial internal carotid arteries are patent without significant stenosis. The M1 middle cerebral arteries are patent without significant stenosis. Question occlusion of a  mid to distal right M2 MCA branch vessel (see annotations on series 9, images 67 through 71) (series 12, image 15). The anterior cerebral arteries are patent without significant proximal stenosis. No intracranial aneurysm is identified. Posterior circulation: The intracranial vertebral arteries are patent. Mild atherosclerotic narrowing of the V4 right vertebral artery beyond the origin of the right PICA. The basilar artery is patent without significant stenosis. The posterior cerebral arteries are patent without significant proximal stenosis. Atherosclerotic irregularity of the distal right posterior cerebral arteries bilaterally. There is the left P1 segment is hypoplastic. There is a sizable left posterior communicating artery. No definite right posterior communicating artery is identified. Venous sinuses: Within limitations of contrast timing, no convincing thrombus. Anatomic variants: As described Review of the MIP images confirms the above findings CT Brain Perfusion Findings: ASPECTS: 10 CBF (<30%) Volume: 44m Perfusion (Tmax>6.0s) volume: 167m(within the right parietal lobe) Mismatch Volume: 1247mnfarction Location:None identified These results were called by telephone at the time of interpretation on 06/16/2019 at 9:52 am to provider Dr. AroRory Percyho verbally acknowledged these results. IMPRESSION: CTA neck: 1. The bilateral common and internal carotid arteries are patent within the neck without significant stenosis. Mild calcified plaque within the proximal internal carotid arteries. 2. The bilateral vertebral arteries are patent within the neck. Mixed plaque results in mild stenosis at the origin of the left vertebral artery. CTA head: 1. Possible occlusion of a mid to distal  right M2 MCA branch vessel as described. 2. Mild atherosclerotic narrowing of the V4 right vertebral artery. 3. Atherosclerotic irregularity of the posterior cerebral arteries bilaterally. CT perfusion head: The perfusion software  identifies a 12 mL region of critically hypoperfused parenchyma within the right parietal lobe utilizing the Tmax>6 seconds threshold. No core infarct is identified by the perfusion software. Reported mismatch volume: 12 mL. Electronically Signed   By: Kellie Simmering DO   On: 06/16/2019 10:10   MR BRAIN WO CONTRAST  Result Date: 06/16/2019 CLINICAL DATA:  86 year old female with atrial fibrillation on warfarin. Code stroke presentation earlier today. Possible right M3 branch occlusion on CTA/CTP today. Not a candidate for tPA. EXAM: MRI HEAD WITHOUT CONTRAST TECHNIQUE: Multiplanar, multiecho pulse sequences of the brain and surrounding structures were obtained without intravenous contrast. COMPARISON:  CTA and CTP earlier today. Brain MRI 11/21/2018 and earlier. FINDINGS: Brain: Confluent 3-4 cm area of mostly cortical restricted diffusion in the anterior right parietal lobe, post sensory and with some sensory strip involvement suspected (series 3, image 35). This nicely corresponds to the T-max abnormality on CTP earlier today. There is a focus of petechial hemorrhage, or less likely distal right MCA thrombus identified by SWI on series 8, image 65. No other associated blood products. No mass effect. No other restricted diffusion. A few scattered chronic microhemorrhages elsewhere in the brain are stable from last year, primarily in the posterior temporal and occipital lobes. Stable scattered and patchy bilateral white matter T2 and FLAIR hyperintensity. 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 No midline shift, mass effect, evidence of mass lesion, ventriculomegaly, extra-axial collection or acute intracranial hemorrhage. Cervicomedullary junction and pituitary are within normal limits. Vascular: Major intracranial vascular flow voids are stable since last year. But there is a small posterior right MCA branch visible on FLAIR today (series 7, image 13). Skull and upper cervical spine: Normal for age  visible cervical spine. Hyperostosis of the calvarium. Normal bone marrow signal. Sinuses/Orbits: Stable, negative. Other: Mastoids remain clear. Visible internal auditory structures appear normal. IMPRESSION: 1. 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 2. Solitary focus of petechial hemorrhage versus distal vessel thrombus, but no malignant hemorrhagic transformation or mass effect. 3. Otherwise stable chronic small vessel disease. Electronically Signed   By: Genevie Ann M.D.   On: 06/16/2019 16:19   CT Code Stroke Cerebral Perfusion with contrast  Result Date: 06/16/2019 CLINICAL DATA:  Focal neuro deficit, greater than 6 hours, stroke suspected. EXAM: CT ANGIOGRAPHY HEAD AND NECK CT PERFUSION BRAIN TECHNIQUE: Multidetector CT imaging of the head and neck was performed using the standard protocol during bolus administration of intravenous contrast. Multiplanar CT image reconstructions and MIPs were obtained to evaluate the vascular anatomy. Carotid stenosis measurements (when applicable) are obtained utilizing NASCET criteria, using the distal internal carotid diameter as the denominator. Multiphase CT imaging of the brain was performed following IV bolus contrast injection. Subsequent parametric perfusion maps were calculated using RAPID software. CONTRAST:  Administered contrast not known at this time. COMPARISON:  Concurrently performed noncontrast head CT 06/16/2019, CTA head/neck 06/13/2009, MRA neck 03/29/2006 FINDINGS: CTA NECK FINDINGS Aortic arch: Standard aortic branching. Atherosclerotic plaque within the visualized aortic arch and proximal major branch vessels of the neck. No significant innominate or proximal subclavian artery stenosis. Right carotid system: CCA and ICA patent within the neck without stenosis. Mild calcified plaque within the proximal ICA. Redemonstrated tortuosity of the ICA. Left carotid system:  CCA and ICA patent within  the neck without stenosis. Mild calcified plaque within the proximal ICA. Vertebral arteries: The vertebral arteries are codominant and patent within the neck. Mixed plaque at the origin of the left vertebral artery results in mild ostial stenosis. Skeleton: No acute bony abnormality or aggressive osseous lesion. C5-C6 spondylosis with moderate disc height loss, posterior disc osteophyte and uncovertebral hypertrophy. Other neck: No neck mass or cervical lymphadenopathy. Upper chest: No consolidation within the imaged lung apices. Mild interlobular septal thickening within the visualized lung apices is nonspecific, but may reflect mild edema. Prior median sternotomy. Redemonstrated calcified mediastinal lymph nodes. Review of the MIP images confirms the above findings CTA HEAD FINDINGS Anterior circulation: The intracranial internal carotid arteries are patent without significant stenosis. The M1 middle cerebral arteries are patent without significant stenosis. Question occlusion of a mid to distal right M2 MCA branch vessel (see annotations on series 9, images 67 through 71) (series 12, image 15). The anterior cerebral arteries are patent without significant proximal stenosis. No intracranial aneurysm is identified. Posterior circulation: The intracranial vertebral arteries are patent. Mild atherosclerotic narrowing of the V4 right vertebral artery beyond the origin of the right PICA. The basilar artery is patent without significant stenosis. The posterior cerebral arteries are patent without significant proximal stenosis. Atherosclerotic irregularity of the distal right posterior cerebral arteries bilaterally. There is the left P1 segment is hypoplastic. There is a sizable left posterior communicating artery. No definite right posterior communicating artery is identified. Venous sinuses: Within limitations of contrast timing, no convincing thrombus. Anatomic variants: As described Review of the MIP images confirms  the above findings CT Brain Perfusion Findings: ASPECTS: 10 CBF (<30%) Volume: 49m Perfusion (Tmax>6.0s) volume: 141m(within the right parietal lobe) Mismatch Volume: 1282mnfarction Location:None identified These results were called by telephone at the time of interpretation on 06/16/2019 at 9:52 am to provider Dr. AroRory Percyho verbally acknowledged these results. IMPRESSION: CTA neck: 1. The bilateral common and internal carotid arteries are patent within the neck without significant stenosis. Mild calcified plaque within the proximal internal carotid arteries. 2. The bilateral vertebral arteries are patent within the neck. Mixed plaque results in mild stenosis at the origin of the left vertebral artery. CTA head: 1. Possible occlusion of a mid to distal right M2 MCA branch vessel as described. 2. Mild atherosclerotic narrowing of the V4 right vertebral artery. 3. Atherosclerotic irregularity of the posterior cerebral arteries bilaterally. CT perfusion head: The perfusion software identifies a 12 mL region of critically hypoperfused parenchyma within the right parietal lobe utilizing the Tmax>6 seconds threshold. No core infarct is identified by the perfusion software. Reported mismatch volume: 12 mL. Electronically Signed   By: KylKellie Simmering   On: 06/16/2019 10:10   ECHOCARDIOGRAM COMPLETE  Result Date: 06/16/2019    ECHOCARDIOGRAM REPORT   Patient Name:   MARMCKYNLEIGH MUSSELLte of Exam: 06/16/2019 Medical Rec #:  [id number]  Height:       65.0 in Accession #:    [id number] Weight:       182.0 lb Date of Birth:  [birth year]   BSA:          1.900 m Patient Age:    [age]ars      BP:           140/77 mmHg Patient Gender: F             HR:  83 bpm. Exam Location:  Inpatient Procedure: 2D Echo                                MODIFIED REPORT: This report was modified by Eleonore Chiquito MD on 06/16/2019 due to updated RVSP.  Indications:     Stroke I163.9  History:         Patient has prior history of  Echocardiogram examinations, most                  recent 12/01/2016. Arrythmias:Atrial Fibrillation; Risk                  Factors:Dyslipidemia.                   Mitral Valve: prosthetic annuloplasty ring valve is present in                  the mitral position. Procedure Date: 10/23/1998.  Sonographer:     Mikki Santee RDCS (AE) Referring Phys:  [place] Diagnosing Phys: Eleonore Chiquito MD IMPRESSIONS  1. Left ventricular ejection fraction, by estimation, is 55 to 60%. The left ventricle has normal function. The left ventricle has no regional wall motion abnormalities. Left ventricular diastolic function could not be evaluated.  2. Right ventricular systolic function is normal. The right ventricular size is normal. There is mildly elevated pulmonary artery systolic pressure. The estimated right ventricular systolic pressure is 03.5 mmHg.  3. Left atrial size was severely dilated.  4. A mitral valve annuloplasty ring is present. There is a heavily calcified focal mass-like structure present on the PMVL. This is likely deterioration of the PMVL with mitral annular calcification. There are no mobile components to it. There is no apparent destruction of the valve. There is mild to moderate MR. There is mild mitral stenosis. This is best viewed in the PLAX. When compared with the prior echo this was present but not specifically commented on. The mitral valve has been repaired/replaced. Mild to moderate mitral valve regurgitation. The mean mitral valve gradient is 6.3 mmHg with average heart rate of 80 bpm. There is a prosthetic annuloplasty ring present in the mitral position. Procedure Date: 10/23/1998.  5. The aortic valve is tricuspid. Aortic valve regurgitation is not visualized. Mild aortic valve sclerosis is present, with no evidence of aortic valve stenosis.  6. The inferior vena cava is dilated in size with <50% respiratory variability, suggesting right atrial pressure of 15 mmHg. Comparison(s): A  prior study was performed on 12/01/2016. No significant change from prior study. FINDINGS  Left Ventricle: Left ventricular ejection fraction, by estimation, is 55 to 60%. The left ventricle has normal function. The left ventricle has no regional wall motion abnormalities. The left ventricular internal cavity size was normal in size. There is  no left ventricular hypertrophy. Left ventricular diastolic function could not be evaluated due to mitral valve repair. Left ventricular diastolic function could not be evaluated. Right Ventricle: The right ventricular size is normal. No increase in right ventricular wall thickness. Right ventricular systolic function is normal. There is mildly elevated pulmonary artery systolic pressure. The tricuspid regurgitant velocity is 2.35  m/s, and with an assumed right atrial pressure of 15 mmHg, the estimated right ventricular systolic pressure is 46.5 mmHg. Left Atrium: Left atrial size was severely dilated. Right Atrium: Right atrial size was normal in size. Pericardium: There is no evidence of pericardial effusion. Presence  of pericardial fat pad. Mitral Valve: A mitral valve annuloplasty ring is present. There is a heavily calcified focal mass-like structure present on the PMVL. This is likely deterioration of the PMVL with mitral annular calcification. There are no mobile components to it. There  is no apparent destruction of the valve. There is mild to moderate MR. There is mild mitral stenosis. This is best viewed in the PLAX. When compared with the prior echo this was present but not specifically commented on. The mitral valve has been repaired/replaced. Mild to moderate mitral valve regurgitation. There is a prosthetic annuloplasty ring present in the mitral position. Procedure Date: 10/23/1998. MV peak gradient, 16.2 mmHg. The mean mitral valve gradient is 6.3 mmHg with average heart rate of 80 bpm. Tricuspid Valve: The tricuspid valve is grossly normal. Tricuspid valve  regurgitation is not demonstrated. No evidence of tricuspid stenosis. Aortic Valve: The aortic valve is tricuspid. Aortic valve regurgitation is not visualized. Mild aortic valve sclerosis is present, with no evidence of aortic valve stenosis. Pulmonic Valve: The pulmonic valve was grossly normal. Pulmonic valve regurgitation is trivial. No evidence of pulmonic stenosis. Aorta: The aortic root is normal in size and structure. Venous: The inferior vena cava is dilated in size with less than 50% respiratory variability, suggesting right atrial pressure of 15 mmHg. IAS/Shunts: The atrial septum is grossly normal. EKG: Rhythm strip during this exam demostrated atrial fibrillation.  LEFT VENTRICLE PLAX 2D LVIDd:         4.20 cm LVIDs:         3.10 cm LV PW:         1.00 cm LV IVS:        1.00 cm LVOT diam:     2.00 cm LV SV:         42 LV SV Index:   22 LVOT Area:     3.14 cm  RIGHT VENTRICLE RV S prime:     9.24 cm/s TAPSE (M-mode): 1.5 cm LEFT ATRIUM              Index       RIGHT ATRIUM           Index LA diam:        4.70 cm  2.47 cm/m  RA Area:     19.60 cm LA Vol (A2C):   110.0 ml 57.88 ml/m RA Volume:   46.30 ml  24.36 ml/m LA Vol (A4C):   113.0 ml 59.46 ml/m LA Biplane Vol: 115.0 ml 60.51 ml/m  AORTIC VALVE LVOT Vmax:   70.23 cm/s LVOT Vmean:  47.567 cm/s LVOT VTI:    0.133 m  AORTA Ao Root diam: 3.00 cm MITRAL VALVE              TRICUSPID VALVE MV Area (PHT): 2.97 cm   TR Peak grad:   22.1 mmHg MV Peak grad:  16.2 mmHg  TR Vmax:        235.00 cm/s MV Mean grad:  6.3 mmHg MV Vmax:       2.01 m/s   SHUNTS MV Vmean:      112.5 cm/s Systemic VTI:  0.13 m MR Peak grad: 116.6 mmHg  Systemic Diam: 2.00 cm MR Vmax:      540.00 cm/s Eleonore Chiquito MD Electronically signed by Eleonore Chiquito MD Signature Date/Time: 06/16/2019/4:43:21 PM    Final (Updated)    CT HEAD CODE STROKE WO CONTRAST  Result Date: 06/16/2019 CLINICAL DATA:  Code stroke. Neuro deficit,  acute, stroke suspected. Additional history provided:  Last known normal 5 a.m., patient awoke and felt as if her left hand did not along to her. EXAM: CT HEAD WITHOUT CONTRAST TECHNIQUE: Contiguous axial images were obtained from the base of the skull through the vertex without intravenous contrast. COMPARISON:  Brain MRI 11/21/2018, head CT 04/12/2018 FINDINGS: Brain: There is no evidence of acute intracranial hemorrhage, intracranial mass, midline shift or extra-axial fluid collection.No demarcated cortical infarction. Mild ill-defined hypoattenuation within the cerebral white matter is nonspecific, but consistent with chronic small vessel ischemic disease and stable from prior exams. Stable, mild generalized parenchymal atrophy. 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 Vascular: No hyperdense vessel.  Atherosclerotic calcifications. Skull: Normal. Negative for fracture or focal lesion. Sinuses/Orbits: Visualized orbits demonstrate no acute abnormality. Mild ethmoid sinus mucosal thickening. No significant mastoid effusion. These results were communicated to Dr. Rory Percy At 9:37 amon 3/26/2021by text page via the [facility] messaging system. IMPRESSION: 1. No evidence of acute intracranial abnormality. 2. 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 3. Stable generalized parenchymal atrophy and chronic small vessel ischemic disease. 4. Mild ethmoid sinus mucosal thickening. Electronically Signed   By: Kellie Simmering DO   On: 06/16/2019 09:38        Scheduled Meds: . aspirin  300 mg Rectal Daily   Or  . aspirin  325 mg Oral Daily  . atorvastatin  40 mg Oral q1800  . ezetimibe  10 mg Oral Daily  . levothyroxine  25 mcg Oral QAC breakfast  . metoprolol tartrate  25 mg Oral Q6H  . Warfarin - Pharmacist Dosing Inpatient   Does not apply q1800   Continuous Infusions: . sodium chloride 50 mL/hr at 06/16/19 1804     LOS: 1 day        Hosie Poisson, MD [facility]   To contact the attending provider between 7A-7P or the covering provider during after hours 7P-7A, please log into the web site [email or web address] and access using universal [place] password for that web site. If you do not have the password, please call the hospital operator.  06/17/2019, 3:44 PM

## 2019-06-17 NOTE — Evaluation (Signed)
Physical Therapy Evaluation Patient Details Name: [name] MRN: [id number] DOB: [birth year] Today's Date: 06/17/2019   History of Present Illness  86 y.o. female with medical history significant of s/p MVR; hypothyroidism; HLD; TIA x 2; chronic diastolic CHF; afib, and mild carotid stenosis. Presenting with concern for CVA.  Clinical Impression  Limited evaluation due to elevated heart rate. Static and limited dynamic balance testing unremarkable with low-fall risk score via BERG balance testing. Did not assess gait due to elevated HR up to 150s with standing. Improves with seated rest, 105 in supine. Pt admitted with above complications.  Pt currently with functional limitations due to the deficits listed below (see PT Problem List). Anticipate excellent ambulatory ability based on limited assessment today; we will follow-up to check this when HR is better controlled (likely Monday.) Lives with son and and is independent at home, including IADLs and driving.  Follow Up Recommendations No PT follow up    Equipment Recommendations  None recommended by PT    Recommendations for Other Services       Precautions / Restrictions Precautions Precautions: Fall Restrictions Weight Bearing Restrictions: No      Mobility  Bed Mobility Overal bed mobility: Independent                Transfers Overall transfer level: Modified independent Equipment used: None                Ambulation/Gait             General Gait Details: Not assessed due to HR elevation  Stairs            Wheelchair Mobility    Modified Rankin (Stroke Patients Only) Modified Rankin (Stroke Patients Only) Pre-Morbid Rankin Score: No symptoms Modified Rankin: No significant disability     Balance Overall balance assessment: Modified Independent                               Standardized Balance Assessment Standardized Balance Assessment : Berg Balance Test Berg Balance  Test Sit to Stand: Able to stand  independently using hands Standing Unsupported: Able to stand safely 2 minutes Sitting with Back Unsupported but Feet Supported on Floor or Stool: Able to sit safely and securely 2 minutes Stand to Sit: Sits safely with minimal use of hands Transfers: Able to transfer safely, minor use of hands Standing Unsupported with Eyes Closed: Able to stand 10 seconds safely Standing Ubsupported with Feet Together: Able to place feet together independently and stand 1 minute safely From Standing, Reach Forward with Outstretched Arm: Can reach confidently >25 cm (10") From Standing Position, Pick up Object from Floor: Able to pick up shoe safely and easily From Standing Position, Turn to Look Behind Over each Shoulder: Looks behind from both sides and weight shifts well Turn 360 Degrees: Able to turn 360 degrees safely one side only in 4 seconds or less Standing Unsupported, Alternately Place Feet on Step/Stool: Able to stand independently and safely and complete 8 steps in 20 seconds Standing Unsupported, One Foot in Front: Able to plae foot ahead of the other independently and hold 30 seconds Standing on One Leg: Able to lift leg independently and hold > 10 seconds Total Score: 53         Pertinent Vitals/Pain Pain Assessment: No/denies pain    Home Living Family/patient expects to be discharged to:: Private residence Living Arrangements: Children(son lives in guest apartment) Available  Help at Discharge: Family Type of Home: House Home Access: Level entry     Home Layout: One level Home Equipment: Environmental consultant - 2 wheels      Prior Function Level of Independence: Independent               Hand Dominance   Dominant Hand: Left    Extremity/Trunk Assessment   Upper Extremity Assessment Upper Extremity Assessment: Defer to OT evaluation    Lower Extremity Assessment Lower Extremity Assessment: Overall WFL for tasks assessed       Communication    Communication: No difficulties  Cognition Arousal/Alertness: Awake/alert Behavior During Therapy: WFL for tasks assessed/performed Overall Cognitive Status: Within Functional Limits for tasks assessed                                        General Comments General comments (skin integrity, edema, etc.): HR at 105 resting, elevated briefly to 130s in seated position but decreased to 110s. Upon standing and static balance testing HR varied 130s-150s. Asymptomatic. Deferred gait assessment at this time due to elevated HR with minimal activity.    Exercises     Assessment/Plan    PT Assessment Patient needs continued PT services  PT Problem List Decreased activity tolerance;Cardiopulmonary status limiting activity       PT Treatment Interventions Gait training;Functional mobility training;Therapeutic activities;Therapeutic exercise;Patient/family education    PT Goals (Current goals can be found in the Care Plan section)  Acute Rehab PT Goals Patient Stated Goal: Get well PT Goal Formulation: With patient Time For Goal Achievement: 07/01/19 Potential to Achieve Goals: Good    Frequency Min 3X/week   Barriers to discharge        Co-evaluation               AM-PAC PT "6 Clicks" Mobility  Outcome Measure Help needed turning from your back to your side while in a flat bed without using bedrails?: None Help needed moving from lying on your back to sitting on the side of a flat bed without using bedrails?: None Help needed moving to and from a bed to a chair (including a wheelchair)?: None Help needed standing up from a chair using your arms (e.g., wheelchair or bedside chair)?: None Help needed to walk in hospital room?: None Help needed climbing 3-5 steps with a railing? : A Little 6 Click Score: 23    End of Session   Activity Tolerance: Patient tolerated treatment well Patient left: in bed;with call bell/phone within reach;with bed alarm set;with  family/visitor present   PT Visit Diagnosis: Other symptoms and signs involving the nervous system [id number])    Time: [id number] PT Time Calculation (min) (ACUTE ONLY): 15 min   Charges:   PT Evaluation $PT Eval Low Complexity: 1 Low          [place], PT   Ellouise Newer 06/17/2019, 12:18 PM

## 2019-06-17 NOTE — Progress Notes (Signed)
Initial Nutrition Assessment  DOCUMENTATION CODES:   Obesity unspecified  INTERVENTION:  Ensure Enlive po daily, each supplement provides 350 kcal and 20 grams of protein  Heart Healthy diet education attached to discharge instructions   NUTRITION DIAGNOSIS:   Increased nutrient needs related to acute illness, chronic illness(CVA, chronic diastolic CHF) as evidenced by estimated needs.    GOAL:   Patient will meet greater than or equal to 90% of their needs    MONITOR:   PO intake, Supplement acceptance, Weight trends, I & O's, Labs  REASON FOR ASSESSMENT:   Consult Other (Comment)(CVA)  ASSESSMENT:  RD working remotely.  86 year old female with past medical history significant of MVR, hypothyroidism, HLD, TIA x 2, chronic diastolic CHF, afib on Coumadin, and mild carotid stenosis (2011) presented with concern for CVA. In ED, patient noted with left sided neglect, code stroke called but not in the window for intervention, perfusion study revealed stroke in the right parietal region.  Patient admitted on 3/26 for CVA.  Patient endorses poor intake of breakfast due to dislike, recalls having a few bites of eggs, half of chicken pattie and milk.Patient reports that she is an average eater, normally has 2 meals/day. She lives alone and does not prepare large meals, she recalls scrambled eggs in the morning with spicy salsa with a sprinkle of bacon bits. Recalls quick already prepared meals for dinner. She typically does not snack reports eating lots of  apples, bananas, and keeps fig bars in her car if she feels she needs "quick energy" Patient reports she does not feel like her diet is unhealthy, she endorses weight gain during the pandemic secondary to being isolated and unable to get out as she once did. Prior to pandemic she was doing water aerobics at the [facility] 3x/week.   Per notes: -afib rate is up, avoid beta blocker d/t fatigue -AV ablation/pacer if necessary -mass  like structure on MV, cardiology recommending TEE to assess given CVA  Patient is on a HH diet and eating 90-100% x 2 documented meals this admission.   Current wt 181.72 lbs Weight history reviewed, stable over the past 8 months.  Mediations and labs reviewed  NUTRITION - FOCUSED PHYSICAL EXAM: Unable to complete at this time, RD working remotely.   Diet Order:   Diet Order            Diet Heart Fluid consistency: Thin  Diet effective ____              EDUCATION NEEDS:   Education needs have been addressed  Skin:  Skin Assessment: Reviewed RN Assessment  Last BM:  3/26  Height:   Ht Readings from Last 1 Encounters:  06/16/19 5\' 5"  (1.651 m)    Weight:   Wt Readings from Last 1 Encounters:  06/16/19 82.6 kg    Ideal Body Weight:  56.8 kg  BMI:  Body mass index is 30.29 kg/m.  Estimated Nutritional Needs:   Kcal:  1600-1800  Protein:  80-90  Fluid:  >/= 1.6 L/day   Lajuan Lines, RD, LDN Clinical Nutrition After Hours/Weekend Pager # in [place]

## 2019-06-17 NOTE — Progress Notes (Signed)
STROKE TEAM PROGRESS NOTE   HISTORY OF PRESENT ILLNESS (per record) [name] is an 86 y.o. female  With PMH a. Fib ( on coumadin), HLD, TIA (2006, 2011) who presented to [facility] ED with c/o problem identifying left arm. patient went to bed last night about 11pm and woke up this morning  0500 with her hand on her face, and she did not realize that it was her left hand. She denies HA, dizziness, vision changes, confusion, weakness. Patient did take her warfarin this AM. Denies CP, SOB. No prior stroke history ED course:  CTH: no hemorrhage CTA head and neck: no LVO-possible distal right M3 occlusion. CTP: core: 0 tmax: 54ml  BP: 146/87 BG: 98 creatinine: 0.7 Date last known well: 06/15/19 Time last known well: 1100 tPA Given: no; outside of the window Modified Rankin: Rankin Score=0 NIHSS:2   INTERVAL HISTORY Left arm still feels a big foreign but improved vs admission.  MRI Brain shows acute right parietal infarct and mild periventricular and subcortical small vessel ischemic disease.  CTA Brain and Neck are normal.  Tele shows a fib with RVR.  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  TEE pending. INR 1.8, and has never been offered direct factor X inhibitors.  A1c 5.8.  UDS negative.  LDL 169, but she was on no treatment due to intolerance and swelling with statins.  She has been started on Zetia here.      OBJECTIVE Vitals:   06/16/19 1828 06/16/19 2054 06/16/19 2325 06/17/19 0336  BP: (!) 163/117 123/84 136/83 122/78  Pulse: 81 94 (!) 43 (!) 105  Resp:  16 18 18   Temp: 98.1 F (36.7 C) 98.4 F (36.9 C) 98.1 F (36.7 C) 98.2 F (36.8 C)  TempSrc: Oral Oral Oral Oral  SpO2: 97% 95% 98% 95%  Weight:      Height:        CBC:  Recent Labs  Lab 06/16/19 0910 06/16/19 0934  WBC 4.5  --   NEUTROABS 2.8  --   HGB 12.4 12.2  HCT 39.0 36.0  MCV 89.2  --   PLT 147*  --     Basic Metabolic Panel:  Recent Labs  Lab 06/16/19 0910 06/16/19 0934  NA 141  142  K 4.2 4.0  CL 106 106  CO2 23  --   GLUCOSE 104* 98  BUN 14 15  CREATININE 0.78 0.70  CALCIUM 9.0  --     Lipid Panel:     Component Value Date/Time   CHOL 198 09/08/2018 0859   TRIG 97.0 09/08/2018 0859   HDL 38.90 (L) 09/08/2018 0859   CHOLHDL 5 09/08/2018 0859   VLDL 19.4 09/08/2018 0859   LDLCALC 139 (H) 09/08/2018 0859   HgbA1c:  Lab Results  Component Value Date   HGBA1C 5.8 (H) 06/17/2019   Urine Drug Screen:     Component Value Date/Time   LABOPIA NONE DETECTED 06/16/2019 1245   COCAINSCRNUR NONE DETECTED 06/16/2019 1245   LABBENZ NONE DETECTED 06/16/2019 1245   AMPHETMU NONE DETECTED 06/16/2019 1245   THCU NONE DETECTED 06/16/2019 1245   LABBARB NONE DETECTED 06/16/2019 1245    Alcohol Level     Component Value Date/Time   ETH <10 06/16/2019 0910    IMAGING  CT Code Stroke CTA Head W/WO contrast CT Code Stroke CTA Neck W/WO contrast CT Code Stroke Cerebral Perfusion with contrast 06/16/2019 IMPRESSION:   CTA neck:  1. The bilateral  common and internal carotid arteries are patent within the neck without significant stenosis. Mild calcified plaque within the proximal internal carotid arteries.  2. The bilateral vertebral arteries are patent within the neck. Mixed plaque results in mild stenosis at the origin of the left vertebral artery.   CTA head:  1. Possible occlusion of a mid to distal right M2 MCA branch vessel as described.  2. Mild atherosclerotic narrowing of the V4 right vertebral artery.  3. Atherosclerotic irregularity of the posterior cerebral arteries bilaterally.   CT perfusion head:  The perfusion software identifies a 12 mL region of critically hypoperfused parenchyma within the right parietal lobe utilizing the Tmax>6 seconds threshold. No core infarct is identified by the perfusion software. Reported mismatch volume: 12 mL.   MR BRAIN WO CONTRAST 06/16/2019 IMPRESSION:  1. Acute infarct in the posterior Right MCA territory  corresponding to the T-max abnormality on CTP today.  2. Solitary focus of petechial hemorrhage versus distal vessel thrombus, but no malignant hemorrhagic transformation or mass effect.  3. Otherwise stable chronic small vessel disease.   ECHOCARDIOGRAM COMPLETE 06/16/2019 IMPRESSIONS   1. Left ventricular ejection fraction, by estimation, is 55 to 60%. The left ventricle has normal function. The left ventricle has no regional wall motion abnormalities. Left ventricular diastolic function could not be evaluated.   2. Right ventricular systolic function is normal. The right ventricular size is normal. There is mildly elevated pulmonary artery systolic pressure. The estimated right ventricular systolic pressure is [id number] mmHg.   3. Left atrial size was severely dilated.   4. A mitral valve annuloplasty ring is present. There is a heavily calcified focal mass-like structure present on the PMVL. This is likely deterioration of the PMVL with mitral annular calcification. There are no mobile components to it. There is no apparent destruction of the valve. There is mild to moderate MR. There is mild mitral stenosis. This is best viewed in the PLAX. When compared with the prior echo this was present but not specifically commented on. The mitral valve has been repaired/replaced. Mild to moderate mitral valve regurgitation. The mean mitral valve gradient is 6.3 mmHg with average heart rate of 80 bpm. There is a prosthetic annuloplasty ring present in the mitral position. Procedure Date: 10/23/1998.   5. The aortic valve is tricuspid. Aortic valve regurgitation is not visualized. Mild aortic valve sclerosis is present, with no evidence of aortic valve stenosis.   6. The inferior vena cava is dilated in size with <50% respiratory variability, suggesting right atrial pressure of 15 mmHg. Comparison(s): A prior study was performed on 12/01/2016. No significant change from prior study.   CT HEAD CODE STROKE WO  CONTRAST 06/16/2019 IMPRESSION:  1. No evidence of acute intracranial abnormality.  2. 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  3. Stable generalized parenchymal atrophy and chronic small vessel ischemic disease.  4. Mild ethmoid sinus mucosal thickening.   ECG - atrial fibrillation - ventricular response 83 BPM (See cardiology reading for complete details)  PHYSICAL EXAM Blood pressure 122/78, pulse (!) 105, temperature 98.2 F (36.8 C), temperature source Oral, resp. rate 18, height 5\' 5"  (1.651 m), weight 82.6 kg, SpO2 95 %.  Awake, alert, fully oriented. Language - fluent. Comprehension, naming, repetition - intact. Face symmetrical. Tongue midline. Strength 5/5 BUE and BLE.  FTN intact, but has dysmetrica on left with eyes closed. There is simultaneous extinction with double stimulation on the left about 50% of the time. Paradoxically, the  left arm had hyperalgesia vs right arm.        ASSESSMENT/PLAN Ms. PERL ESPER is a 86 y.o. female with history of a. Fib ( on coumadin - INR 1.90), HLD, hx of MV repair, carotid stenosis, pulmonary nodule, TIA (2006, 2011) who presented to [facility] ED with c/o problem identifying left arm. 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   MRI head - Acute infarct in the posterior Right MCA territory. Solitary focus of petechial hemorrhage versus distal vessel thrombus.  MRA head - not ordered  CTA H&N - Possible occlusion of a mid to distal right M2 MCA branch vessel. The bilateral common and internal carotid arteries are patent within the neck without significant stenosis. Mild  calcified plaque within the proximal internal carotid arteries. The bilateral vertebral arteries are patent within the neck. Mixed plaque results in mild stenosis at the origin of the left vertebral artery.   CT Perfusion - The perfusion software identifies a 12 mL region of critically hypoperfused parenchyma within the right parietal lobe utilizing the Tmax>6 seconds threshold. No core infarct is identified by the perfusion software. Reported mismatch volume: 12 mL  Carotid Doppler - CTA neck performed - carotid dopplers not indicated.  2D Echo - EF 55 - 60%. No cardiac source of emboli identified.   Sars Corona Virus 2 - negative  LDL - 169  HgbA1c - 5.8  UDS - negative  VTE prophylaxis - warfarin Diet  Diet Order            Diet Heart Fluid consistency: Thin  Diet effective ____               warfarin daily prior to admission, now on aspirin 325 mg daily with warfarin daily  Patient counseled to be compliant with her antithrombotic medications  Ongoing aggressive stroke risk factor management  Therapy recommendations:  No PT f/u  Disposition:  Pending  Hypertension  Home BP meds: Lopressor ; cardizem  Current BP meds: none   Blood pressure somewhat high at times but within post stroke/TIA parameters . Permissive hypertension (OK if < 220/120) but gradually normalize in 5-7 days  . Long-term BP goal normotensive  Hyperlipidemia  Home Lipid lowering medication: none  LDL 169, goal < 70  Current lipid lowering medication: Lipitor 40 mg daily  Continue statin at discharge  Statin allergy - rash  Other Stroke Risk Factors  Advanced age  ETOH use, advised to drink no more than 1 alcoholic beverage per day.  Obesity, Body mass index is 30.29 kg/m., recommend weight loss, diet and exercise as appropriate   Hx of TIA  Atrial fibrillation  Other Active Problems  Code status - Full code  Mild thrombocytopenia - [facility] day #  1  Impression: Acute right parietal infarct, cardioembolic due to subtherapeutic INR, resulting in alien arm on left and sensory ataxia.  She is s/p ablation x 2 for a. Fib unsuccessfully.  Coumadin has been re-started toward INR goal of 2.5.  I recommend Eliquis 5 mg bid instead for better compliance if cardiology agrees.   Her LDL cholesterol is high.  We will  see if Zetia can reduce it to goal <70.  If not consideration can be given to Crestor which is usually best tolerated statin.  She is not diabetic.  Her BP fluctuations and we an adjust the beta blocker to control HR for a. Fib and BP as necessary.    Rogue Jury, MS, MD  To contact Stroke Continuity provider, please refer to [email or web address] After hours, contact General Neurology

## 2019-06-18 LAB — PROTIME-INR
INR: 1.9 — ABNORMAL HIGH (ref 0.8–1.2)
Prothrombin Time: 21.6 seconds — ABNORMAL HIGH (ref 11.4–15.2)

## 2019-06-18 MED ORDER — WARFARIN SODIUM 5 MG PO TABS
5.0000 mg | ORAL_TABLET | Freq: Once | ORAL | Status: AC
  Administered 2019-06-18: 17:00:00 5 mg via ORAL
  Filled 2019-06-18: qty 1

## 2019-06-18 MED ORDER — ASPIRIN EC 81 MG PO TBEC
81.0000 mg | DELAYED_RELEASE_TABLET | Freq: Every day | ORAL | Status: DC
  Administered 2019-06-19 – 2019-06-20 (×2): 81 mg via ORAL
  Filled 2019-06-18 (×2): qty 1

## 2019-06-18 NOTE — Progress Notes (Signed)
Physical Therapy Treatment Patient Details Name: [name] MRN: [id number] DOB: November 05, 1932 Today's Date: 06/18/2019    History of Present Illness 86 y.o. female with medical history significant of s/p MVR; hypothyroidism; HLD; TIA x 2; chronic diastolic CHF; afib, and mild carotid stenosis. Presenting with concern for CVA.    PT Comments    Continuing work on functional mobility and activity tolerance;  Agreeable to amb in the room, with close monitor of HR, which ranged 85-121bpm;  Noted for TEE tomorrow, and it seems most current plan is for possible ablation and pacemaker placement as an outpatient  Follow Up Recommendations  No PT follow up     Equipment Recommendations  None recommended by PT    Recommendations for Other Services       Precautions / Restrictions Precautions Precautions: Fall Precaution Comments: watch HR     Mobility  Bed Mobility Overal bed mobility: Independent                Transfers Overall transfer level: Independent                  Ambulation/Gait Ambulation/Gait assistance: Supervision Gait Distance (Feet): 80 Feet(back and forth in room) Assistive device: IV Pole Gait Pattern/deviations: WFL(Within Functional Limits)     General Gait Details: Cues to self-monitor for activity tolerance; HR range 85-121 bpm with amb today   Stairs             Wheelchair Mobility    Modified Rankin (Stroke Patients Only) Modified Rankin (Stroke Patients Only) Pre-Morbid Rankin Score: No symptoms Modified Rankin: No significant disability     Balance Overall balance assessment: No apparent balance deficits (not formally assessed)                                          Cognition Arousal/Alertness: Awake/alert Behavior During Therapy: WFL for tasks assessed/performed Overall Cognitive Status: Within Functional Limits for tasks assessed                                         Exercises      General Comments General comments (skin integrity, edema, etc.): HR range 85-121bpm with in-room walking      Pertinent Vitals/Pain Pain Assessment: No/denies pain    Home Living                      Prior Function            PT Goals (current goals can now be found in the care plan section) Acute Rehab PT Goals Patient Stated Goal: Get well; back to home and normal PT Goal Formulation: With patient Time For Goal Achievement: 07/01/19 Potential to Achieve Goals: Good Progress towards PT goals: Progressing toward goals    Frequency    Min 3X/week      PT Plan Current plan remains appropriate    Co-evaluation              AM-PAC PT "6 Clicks" Mobility   Outcome Measure  Help needed turning from your back to your side while in a flat bed without using bedrails?: None Help needed moving from lying on your back to sitting on the side of a flat bed without using bedrails?: None Help needed moving  to and from a bed to a chair (including a wheelchair)?: None Help needed standing up from a chair using your arms (e.g., wheelchair or bedside chair)?: None Help needed to walk in hospital room?: None Help needed climbing 3-5 steps with a railing? : A Little 6 Click Score: 23    End of Session Equipment Utilized During Treatment: Gait belt Activity Tolerance: Patient tolerated treatment well Patient left: in bed;with call bell/phone within reach(sitting EOB) Nurse Communication: Other (comment)(HR stable with amb today) PT Visit Diagnosis: Other symptoms and signs involving the nervous system (R29.898)     Time: 1244-1300 PT Time Calculation (min) (ACUTE ONLY): 16 min  Charges:  $Gait Training: 8-22 mins                     Roney Marion, PT  Acute Rehabilitation Services Pager [phone] Office [place] 06/18/2019, 3:30 PM

## 2019-06-18 NOTE — Progress Notes (Signed)
PT Cancellation Note  Patient Details Name: [name] MRN: [id number] DOB: 05-Nov-1932   Cancelled Treatment:    Reason Eval/Treat Not Completed: Other (comment)   Politely declining; wants to participate in her church service (over the internet)  Will follow up later today as time allows;  Otherwise, will follow up for PT tomorrow;   Thank you,  Roney Marion, PT  Acute Rehabilitation Services Pager [phone] Office [phone]     [name] 06/18/2019, 11:21 AM

## 2019-06-18 NOTE — Progress Notes (Signed)
PROGRESS NOTE    [name]  [id number] DOB: [birth year] DOA: 06/16/2019 PCP: Abner Greenspan, MD    Brief Narrative:  86 year old lady with prior history of mitral valve repair, permanent atrial fibrillation on anticoagulation, chronic diastolic heart failure, carotid stenosis, hypothyroidism, hyperlipidemia, TIA presents with tingling face and left upper extremity.  Code stroke was called but patient was out of window for TPA.  She was admitted for evaluation of a stroke.  Initial CT of the head no any acute intracranial abnormality. MRI of the brain shows Acute infarct in the posterior Right MCA territory. Neurology consulted and further work-up was ordered. Pt seen and examined, her HR is better with metoprolol.   Assessment & Plan:   Principal Problem:   CVA (cerebral vascular accident) [facility]) Active Problems:   Hypothyroidism   Hyperlipidemia   Atrial fibrillation ([place])   (HFpEF) heart failure with preserved ejection fraction ([place])   Acute Infarct in the posterior right MCA territory Neurology consulted.   MRI brain shows acute infarct in the posterior MCA territory.  Echocardiogram showed LV EF of 55%, no regional wall abnormalities. A mitral valve annuloplasty ring is present. There is a heavily calcified focal mass-like structure present on the PMVL. This is likely deterioration of the PMVL with mitral annular calcification. There are no mobile components to it.  follow up TEE is scheduled for tomorrow.    Carotid duplex ordered and pending.    Therapy evaluations recommending no PT follow up  Hemoglobin A1c at 5.8 LDL is about 169.  Zetia added by cardiology.     Chronic diastolic heart failure Echocardiogram this admission could not assess diastolic dysfunction.  She appears to be compensated.    Chronic atrial fibrillation on Coumadin S/p ablation and cardioversion twice as per the patient.  Patient has been off and on Cardizem and has been intolerant to  it.  Cardiology started the patient on metoprolol. Plan for AV nodal ablation and pacemaker placement to be scheduled as an outpatient.    Essential hypertension Well controlled.  Allow permissive hypertension up to 220/120 mmHg   Hypothyroidism Continue with Synthroid. TSH within normal limits  S/p mitral valve repair. Echocardiogram shows there is a heavily calcified focal mass-like structure present on the PMVL. This is likely deterioration of the PMVL with mitral annular calcification. There are no mobile components to it. Plan for TEE inam.  On Coumadin for anticoagulation Subtherapeutic INR at 1.9. neurology recommends eliquis instead of coumadin if cardiology approves.     DVT prophylaxis: coumadin.  Code Status: Full code Family Communication: none at bedside.  Disposition Plan:  . Patient came from: Home            . Anticipated d/c place: Home.  . Barriers to d/c OR conditions which need to be met to effect a safe d/c: Pending TEE   Consultants:   Cardiology  Neurology  Procedures: MRI of the brain without contrast Antimicrobials: None  Subjective: Pt reports the tingling sensation over the face has improved. The left arm is almost back to normal.  No chest pain or sob, no nausea, vomiting or abdominal pain.   Objective: Vitals:   06/18/19 0329 06/18/19 0935 06/18/19 1257 06/18/19 1610  BP: (!) 103/55 121/75 140/88 112/64  Pulse: 71 86 92 86  Resp: '14 20 20 19  ' Temp: 98.4 F (36.9 C) 97.9 F (36.6 C) 97.6 F (36.4 C) 97.6 F (36.4 C)  TempSrc: Oral Oral Oral Oral  SpO2: 97%  98% 99% 96%  Weight:      Height:        Intake/Output Summary (Last 24 hours) at 06/18/2019 1747 Last data filed at 06/18/2019 0934 Gross per 24 hour  Intake 739.97 ml  Output --  Net 739.97 ml   Filed Weights   06/16/19 0853  Weight: 82.6 kg    Examination:  General exam: alert and comfortable.  Respiratory system:clear to auscultation bilaterally, no wheezing or  rhonchi.  Cardiovascular system: S1S2, Irregular, tachycardic, no JVD, no pedal edema.  Gastrointestinal system: Abdomen is soft, non tender non distended, bowel sounds wnl.  Central nervous system: alert and oriented, she reports her tingling of the face is improving. No focal deficits. Extremities: no pedal edema or cyanosis.  Skin: No rashes.  Psychiatry:  Mood is appropriate.     Data Reviewed: I have personally reviewed following labs and imaging studies  CBC: Recent Labs  Lab 06/16/19 0910 06/16/19 0934  WBC 4.5  --   NEUTROABS 2.8  --   HGB 12.4 12.2  HCT 39.0 36.0  MCV 89.2  --   PLT 147*  --    Basic Metabolic Panel: Recent Labs  Lab 06/16/19 0910 06/16/19 0934  NA 141 142  K 4.2 4.0  CL 106 106  CO2 23  --   GLUCOSE 104* 98  BUN 14 15  CREATININE 0.78 0.70  CALCIUM 9.0  --    GFR: Estimated Creatinine Clearance: 55.5 mL/min (by C-G formula based on SCr of 0.7 mg/dL). Liver Function Tests: Recent Labs  Lab 06/16/19 0910  AST 26  ALT 19  ALKPHOS 40  BILITOT 0.9  PROT 6.8  ALBUMIN 3.5   No results for input(s): LIPASE, AMYLASE in the last 168 hours. No results for input(s): AMMONIA in the last 168 hours. Coagulation Profile: Recent Labs  Lab 06/16/19 0910 06/17/19 0823 06/18/19 0316  INR 1.9* 1.8* 1.9*   Cardiac Enzymes: No results for input(s): CKTOTAL, CKMB, CKMBINDEX, TROPONINI in the last 168 hours. BNP (last 3 results) No results for input(s): PROBNP in the last 8760 hours. HbA1C: Recent Labs    06/17/19 0432  HGBA1C 5.8*   CBG: Recent Labs  Lab 06/16/19 0842  GLUCAP 94   Lipid Profile: Recent Labs    06/17/19 0432  CHOL 218*  HDL 28*  LDLCALC 169*  TRIG 104  CHOLHDL 7.8   Thyroid Function Tests: Recent Labs    06/16/19 1659  TSH 3.817   Anemia Panel: No results for input(s): VITAMINB12, FOLATE, FERRITIN, TIBC, IRON, RETICCTPCT in the last 72 hours. Sepsis Labs: No results for input(s): PROCALCITON,  LATICACIDVEN in the last 168 hours.  Recent Results (from the past 240 hour(s))  SARS CORONAVIRUS 2 (TAT 6-24 HRS) Nasopharyngeal Nasopharyngeal Swab     Status: None   Collection Time: 06/16/19 10:40 AM   Specimen: Nasopharyngeal Swab  Result Value Ref Range Status   SARS Coronavirus 2 NEGATIVE NEGATIVE Final    Comment: (NOTE) SARS-CoV-2 target nucleic acids are NOT DETECTED. The SARS-CoV-2 RNA is generally detectable in upper and lower respiratory specimens during the acute phase of infection. Negative results do not preclude SARS-CoV-2 infection, do not rule out co-infections with other pathogens, and should not be used as the sole basis for treatment or other patient management decisions. Negative results must be combined with clinical observations, patient history, and epidemiological information. The expected result is Negative. Fact Sheet for Patients: SugarRoll.be Fact Sheet for Healthcare Providers: https://www.woods-mathews.com/ This test is  not yet approved or cleared by the [place] and  has been authorized for detection and/or diagnosis of SARS-CoV-2 by FDA under an Emergency Use Authorization (EUA). This EUA will remain  in effect (meaning this test can be used) for the duration of the COVID-19 declaration under Section 56 4(b)(1) of the Act, 21 U.S.C. section 360bbb-3(b)(1), unless the authorization is terminated or revoked sooner. Performed at [facility], [place] [address], [place], [place] [address]          Radiology Studies: No results found.      Scheduled Meds: . aspirin EC  81 mg Oral Daily  . ezetimibe  10 mg Oral Daily  . levothyroxine  25 mcg Oral QAC breakfast  . metoprolol tartrate  25 mg Oral Q6H  . Warfarin - Pharmacist Dosing Inpatient   Does not apply q1800   Continuous Infusions: . sodium chloride 50 mL/hr at 06/16/19 1804     LOS: 2 days        Hosie Poisson, MD [facility]   To contact the attending provider between 7A-7P or the covering provider during after hours 7P-7A, please log into the web site [email or web address] and access using universal [employer] password for that web site. If you do not have the password, please call the hospital operator.  06/18/2019, 5:47 PM

## 2019-06-18 NOTE — Progress Notes (Signed)
STROKE TEAM PROGRESS NOTE   HISTORY OF PRESENT ILLNESS (per record) [name] is an 86 y.o. female  With PMH a. Fib ( on coumadin), HLD, TIA (2006, 2011) who presented to [facility] ED with c/o problem identifying left arm. patient went to bed last night about 11pm and woke up this morning  0500 with her hand on her face, and she did not realize that it was her left hand. She denies HA, dizziness, vision changes, confusion, weakness. Patient did take her warfarin this AM. Denies CP, SOB. No prior stroke history ED course:  CTH: no hemorrhage CTA head and neck: no LVO-possible distal right M3 occlusion. CTP: core: 0 tmax: 49ml  BP: 146/87 BG: 98 creatinine: 0.7 Date last known well: 06/15/19 Time last known well: 1100 tPA Given: no; outside of the window Modified Rankin: Rankin Score=0 NIHSS:2   INTERVAL HISTORY She feels a lit bit clumsy with the left hand, especially since she is left handed.  However, no foreign or uncontrolled feelings anymore.  Tele shows a fib with RVR. Metoprolol has been started by cardiology.  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  TEE pending for tomorrow. INR  Is 1.9, and cardiologist has deferred discussion of Factor Xa inhibitors to her outpatient cardiologist.  She is on ASA concomitantly at this time.     OBJECTIVE Vitals:   06/18/19 0003 06/18/19 0329 06/18/19 0935 06/18/19 1257  BP: 119/77 (!) 103/55 121/75 140/88  Pulse: 68 71 86 92  Resp: 18 14 20 20   Temp: 98.2 F (36.8 C) 98.4 F (36.9 C) 97.9 F (36.6 C) 97.6 F (36.4 C)  TempSrc: Oral Oral Oral Oral  SpO2: 97% 97% 98% 99%  Weight:      Height:        CBC:  Recent Labs  Lab 06/16/19 0910 06/16/19 0934  WBC 4.5  --   NEUTROABS 2.8  --   HGB 12.4 12.2  HCT 39.0 36.0  MCV 89.2  --   PLT 147*  --     Basic Metabolic Panel:  Recent Labs  Lab 06/16/19 0910 06/16/19 0934  NA 141 142  K 4.2 4.0  CL 106 106  CO2 23  --   GLUCOSE 104* 98  BUN 14 15  CREATININE  0.78 0.70  CALCIUM 9.0  --     Lipid Panel:     Component Value Date/Time   CHOL 218 (H) 06/17/2019 0432   TRIG 104 06/17/2019 0432   HDL 28 (L) 06/17/2019 0432   CHOLHDL 7.8 06/17/2019 0432   VLDL 21 06/17/2019 0432   LDLCALC 169 (H) 06/17/2019 0432   HgbA1c:  Lab Results  Component Value Date   HGBA1C 5.8 (H) 06/17/2019   Urine Drug Screen:     Component Value Date/Time   LABOPIA NONE DETECTED 06/16/2019 1245   COCAINSCRNUR NONE DETECTED 06/16/2019 1245   LABBENZ NONE DETECTED 06/16/2019 1245   AMPHETMU NONE DETECTED 06/16/2019 1245   THCU NONE DETECTED 06/16/2019 1245   LABBARB NONE DETECTED 06/16/2019 1245    Alcohol Level     Component Value Date/Time   ETH <10 06/16/2019 0910    IMAGING  CT Code Stroke CTA Head W/WO contrast CT Code Stroke CTA Neck W/WO contrast CT Code Stroke Cerebral Perfusion with contrast 06/16/2019 IMPRESSION:   CTA neck:  1. The bilateral common and internal carotid arteries are patent within the neck without significant stenosis. Mild calcified plaque within the proximal internal  carotid arteries.  2. The bilateral vertebral arteries are patent within the neck. Mixed plaque results in mild stenosis at the origin of the left vertebral artery.   CTA head:  1. Possible occlusion of a mid to distal right M2 MCA branch vessel as described.  2. Mild atherosclerotic narrowing of the V4 right vertebral artery.  3. Atherosclerotic irregularity of the posterior cerebral arteries bilaterally.   CT perfusion head:  The perfusion software identifies a 12 mL region of critically hypoperfused parenchyma within the right parietal lobe utilizing the Tmax>6 seconds threshold. No core infarct is identified by the perfusion software. Reported mismatch volume: 12 mL.   MR BRAIN WO CONTRAST 06/16/2019 IMPRESSION:  1. 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  2. Solitary focus of petechial  hemorrhage versus distal vessel thrombus, but no malignant hemorrhagic transformation or mass effect.  3. Otherwise stable chronic small vessel disease.   ECHOCARDIOGRAM COMPLETE 06/16/2019 IMPRESSIONS   1. Left ventricular ejection fraction, by estimation, is 55 to 60%. The left ventricle has normal function. The left ventricle has no regional wall motion abnormalities. Left ventricular diastolic function could not be evaluated.   2. Right ventricular systolic function is normal. The right ventricular size is normal. There is mildly elevated pulmonary artery systolic pressure. The estimated right ventricular systolic pressure is [id number] mmHg.   3. Left atrial size was severely dilated.   4. A mitral valve annuloplasty ring is present. There is a heavily calcified focal mass-like structure present on the PMVL. This is likely deterioration of the PMVL with mitral annular calcification. There are no mobile components to it. There is no apparent destruction of the valve. There is mild to moderate MR. There is mild mitral stenosis. This is best viewed in the PLAX. When compared with the prior echo this was present but not specifically commented on. The mitral valve has been repaired/replaced. Mild to moderate mitral valve regurgitation. The mean mitral valve gradient is 6.3 mmHg with average heart rate of 80 bpm. There is a prosthetic annuloplasty ring present in the mitral position. Procedure Date: 10/23/1998.   5. The aortic valve is tricuspid. Aortic valve regurgitation is not visualized. Mild aortic valve sclerosis is present, with no evidence of aortic valve stenosis.   6. The inferior vena cava is dilated in size with <50% respiratory variability, suggesting right atrial pressure of 15 mmHg. Comparison(s): A prior study was performed on 12/01/2016. No significant change from prior study.   CT HEAD CODE STROKE WO CONTRAST 06/16/2019 IMPRESSION:  1. No evidence of acute intracranial abnormality.  2.  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  3. Stable generalized parenchymal atrophy and chronic small vessel ischemic disease.  4. Mild ethmoid sinus mucosal thickening.   ECG - atrial fibrillation - ventricular response 83 BPM (See cardiology reading for complete details)  PHYSICAL EXAM Blood pressure 140/88, pulse 92, temperature 97.6 F (36.4 C), temperature source Oral, resp. rate 20, height 5\' 5"  (1.651 m), weight 82.6 kg, SpO2 99 %.  Awake, alert, fully oriented. Language - fluent. Comprehension, naming, repetition - intact. Face symmetrical. Tongue midline. Strength 5/5 BUE and BLE.  FTN intact, but has dysmetrica on left with eyes closed. There is simultaneous extinction with double stimulation on the left about 50% of the time. Sensory - intact bilaterally.        ASSESSMENT/PLAN [name] is a 86 y.o. female with history  of a. Fib ( on coumadin - INR 1.90), HLD, hx of MV repair, carotid stenosis, pulmonary nodule, TIA (2006, 2011) who presented to [facility] ED with c/o problem identifying left arm. 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   MRI head - Acute infarct in the posterior Right MCA territory. Solitary focus of petechial hemorrhage versus distal vessel thrombus.  MRA head - not ordered  CTA H&N - Possible occlusion of a mid to distal right M2 MCA branch vessel. The bilateral common and internal carotid arteries are patent within the neck without significant stenosis. Mild calcified plaque within the proximal internal carotid arteries. The bilateral vertebral arteries are patent within the  neck. Mixed plaque results in mild stenosis at the origin of the left vertebral artery.   CT Perfusion - The perfusion software identifies a 12 mL region of critically hypoperfused parenchyma within the right parietal lobe utilizing the Tmax>6 seconds threshold. No core infarct is identified by the perfusion software. Reported mismatch volume: 12 mL  Carotid Doppler - CTA neck performed - carotid dopplers not indicated.  2D Echo - EF 55 - 60%. No cardiac source of emboli identified.   Sars Corona Virus 2 - negative  LDL - 169  HgbA1c - 5.8  UDS - negative  VTE prophylaxis - warfarin Diet  Diet Order            Diet Heart Fluid consistency: Thin  Diet effective ____              warfarin daily prior to admission, now on aspirin 325 mg daily with warfarin daily  Patient counseled to be compliant with her antithrombotic medications  Ongoing aggressive stroke risk factor management  Therapy recommendations:  No PT f/u  Disposition:  Pending  Hypertension  Home BP meds: Lopressor ; cardizem  Current BP meds: none   Blood pressure somewhat high at times but within post stroke/TIA parameters . Permissive hypertension (OK if < 220/120) but gradually normalize in 5-7 days  . Long-term BP goal normotensive  Hyperlipidemia  Home Lipid lowering medication: none  LDL 169, goal < 70  Current lipid lowering medication: Lipitor 40 mg daily  Continue statin at discharge  Statin allergy - rash  Other Stroke Risk Factors  Advanced age  ETOH use, advised to drink no more than 1 alcoholic beverage per day.  Obesity, Body mass index is 30.29 kg/m., recommend weight loss, diet and exercise as appropriate   Hx of TIA  Atrial fibrillation  Other Active Problems  Code status - Full code  Mild thrombocytopenia - 147  TEE planned for Monday to evaluate mitral valve mass.   Hospital day # 2  Impression: Acute right parietal infarct, cardioembolic due to  subtherapeutic INR, resulting in alien arm on left and sensory ataxia.  She is s/p ablation x 2 for a. Fib unsuccessfully.  Coumadin has been re-started and INR still sub-therapeutic.   I recommend Eliquis 5 mg bid instead for better compliance if cardiology agrees in the future.   Her LDL cholesterol is high.  We will see if Zetia can reduce it to goal <70.  If not consideration can be given to  Crestor which is usually best tolerated statin.  She is not diabetic.  Her BP fluctuations and we an adjust the beta blocker to control HR for a. Fib and BP as necessary.    Rogue Jury, MS, MD  To contact Stroke Continuity provider, please refer to [email or web address] After hours, contact General Neurology

## 2019-06-18 NOTE — Progress Notes (Signed)
ANTICOAGULATION CONSULT NOTE - Initial Consult  Pharmacy Consult for Warfarin Indication: atrial fibrillation   Allergies  Allergen Reactions  . Amiodarone Hcl Swelling    SWELLING REACTION UNSPECIFIED   . Penicillins Rash    Has patient had a PCN reaction causing immediate rash, facial/tongue/throat swelling, SOB or lightheadedness with hypotension: No Has patient had a PCN reaction causing severe rash involving mucus membranes or skin necrosis: No Has patient had a PCN reaction that required hospitalization:Patient was inpatient when reaction occurred Has patient had a PCN reaction occurring within the last 10 years: No If all of the above answers are "NO", then may proceed with Cephalosporin use.   . Statins Rash    Patient Measurements: Height: 5\' 5"  (165.1 cm) Weight: 182 lb (82.6 kg) IBW/kg (Calculated) : 57 Heparin Dosing Weight:   Vital Signs: Temp: 98.4 F (36.9 C) (03/28 0329) Temp Source: Oral (03/28 0329) BP: 103/55 (03/28 0329) Pulse Rate: 71 (03/28 0329)  Labs: Recent Labs    06/16/19 0910 06/16/19 0934 06/17/19 0823 06/18/19 0316  HGB 12.4 12.2  --   --   HCT 39.0 36.0  --   --   PLT 147*  --   --   --   APTT 36  --   --   --   LABPROT 21.7*  --  20.7* 21.6*  INR 1.9*  --  1.8* 1.9*  CREATININE 0.78 0.70  --   --     Estimated Creatinine Clearance: 55.5 mL/min (by C-G formula based on SCr of 0.7 mg/dL).   Medical History: Past Medical History:  Diagnosis Date  . Allergic rhinitis   . 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 Arthritis   . Atrial fibrillation -persistent cardiologist-  dr klein/  primary EP -- dr Tawanna Sat ([facility])   a. s/p PVI [name] 2010;  b. on tikosyn/coumadin;  c. 05/2009 Echo: EF 60-65%, Gr 2 DD. (first dx 09/ 2007)  . Bilateral lower extremity edema   . Bleeding hemorrhoid   . Carotid stenosis    mild (hosp 3/11)- consult by vasc/ Dr Donnetta Hutching  . Complication of anesthesia    hard to wake  . Diverticulosis of colon   . Dyspnea     on exertion-climbing stairs  . Fatty liver   . H/O cardiac radiofrequency ablation    01/ 2008 at [facility] /  03/ 2010  at [facility]  . Heart failure with preserved ejection fraction ([place])   . History of adenomatous polyp of colon    tubular adenoma's  . History of cardiomyopathy    secondary tachycardia-induced cardiomyopathy -- resolved 2014  . History of squamous cell carcinoma in situ (SCCIS) of skin    05/ 2017  nasal bridge and right medial knee  . History of transient ischemic attack (TIA)    01-24-2005 and 06-12-2009  . Hyperlipidemia   . Hypothyroidism   . Mild intermittent asthma    reacts to cats  . Mixed stress and urge urinary incontinence   . Pulmonary nodule   . S/P mitral valve repair 10-23-1998  dr Boyce Medici at [facility]   for MVP and regurg. (annuloplasty ring procedure)    Medications:  Scheduled:  . aspirin  300 mg Rectal Daily   Or  . aspirin  325 mg Oral Daily  . ezetimibe  10 mg Oral Daily  . levothyroxine  25 mcg Oral QAC breakfast  . metoprolol tartrate  25 mg Oral Q6H  . Warfarin -  Pharmacist Dosing Inpatient   Does not apply q1800    Assessment: Patient is a 38 yof that is being admitted for a possible CVA. The patient has a hx of Afib and MVR. Pharmacy has been asked to dose her home warfarin at this time. The patient's INR goal is 2-3.  PTA Warfarin regimen (5mg  daily except M,F 2.5mg )   INR is up slightly to 1.9 today. We are going to try to shoot for around 2.5 per cards recommendation. Consider reducing ASA to 81mg . No bleeding noted.  She will prob need 5mg  PO qday at discharge.  Goal of Therapy:  INR 2-3 Monitor platelets by anticoagulation protocol: Yes   Plan:   Coumadin 5mg  PO x1 Daily INR  Onnie Boer, PharmD, BCIDP, AAHIVP, CPP Infectious Disease Pharmacist 06/18/2019 8:26 AM

## 2019-06-19 ENCOUNTER — Encounter (HOSPITAL_COMMUNITY): Payer: Self-pay | Admitting: Internal Medicine

## 2019-06-19 ENCOUNTER — Telehealth: Payer: Self-pay | Admitting: Internal Medicine

## 2019-06-19 ENCOUNTER — Encounter (HOSPITAL_COMMUNITY)
Admission: EM | Disposition: A | Discharge status: Home / Self Care | Payer: Self-pay | Source: Home / Self Care | Attending: Internal Medicine

## 2019-06-19 ENCOUNTER — Encounter (HOSPITAL_COMMUNITY): Payer: Medicare Other

## 2019-06-19 ENCOUNTER — Inpatient Hospital Stay (HOSPITAL_COMMUNITY): Payer: Medicare Other | Admitting: Anesthesiology

## 2019-06-19 ENCOUNTER — Inpatient Hospital Stay (HOSPITAL_COMMUNITY): Payer: Medicare Other

## 2019-06-19 DIAGNOSIS — I340 Nonrheumatic mitral (valve) insufficiency: Secondary | ICD-10-CM

## 2019-06-19 HISTORY — PX: TEE WITHOUT CARDIOVERSION: SHX5443

## 2019-06-19 HISTORY — PX: BUBBLE STUDY: SHX6837

## 2019-06-19 LAB — COMPREHENSIVE METABOLIC PANEL
ALT: 14 U/L (ref 0–44)
AST: 15 U/L (ref 15–41)
Albumin: 3.1 g/dL — ABNORMAL LOW (ref 3.5–5.0)
Alkaline Phosphatase: 37 U/L — ABNORMAL LOW (ref 38–126)
Anion gap: 11 (ref 5–15)
BUN: 13 mg/dL (ref 8–23)
CO2: 25 mmol/L (ref 22–32)
Calcium: 8.8 mg/dL — ABNORMAL LOW (ref 8.9–10.3)
Chloride: 106 mmol/L (ref 98–111)
Creatinine, Ser: 0.82 mg/dL (ref 0.44–1.00)
GFR calc Af Amer: >60 mL/min (ref >60)
GFR calc non Af Amer: >60 mL/min (ref >60)
Glucose, Bld: 92 mg/dL (ref 70–99)
Potassium: 4 mmol/L (ref 3.5–5.1)
Sodium: 142 mmol/L (ref 135–145)
Total Bilirubin: 0.9 mg/dL (ref 0.3–1.2)
Total Protein: 6.1 g/dL — ABNORMAL LOW (ref 6.5–8.1)

## 2019-06-19 LAB — CBC
HCT: 35.9 % — ABNORMAL LOW (ref 36.0–46.0)
Hemoglobin: 11.2 g/dL — ABNORMAL LOW (ref 12.0–15.0)
MCH: 28.1 pg (ref 26.0–34.0)
MCHC: 31.2 g/dL (ref 30.0–36.0)
MCV: 90 fL (ref 80.0–100.0)
Platelets: 151 10*3/uL (ref 150–400)
RBC: 3.99 MIL/uL (ref 3.87–5.11)
RDW: 14.6 % (ref 11.5–15.5)
WBC: 4.7 10*3/uL (ref 4.0–10.5)
nRBC: 0 % (ref 0.0–0.2)

## 2019-06-19 LAB — PROTIME-INR
INR: 1.8 — ABNORMAL HIGH (ref 0.8–1.2)
Prothrombin Time: 20.8 seconds — ABNORMAL HIGH (ref 11.4–15.2)

## 2019-06-19 SURGERY — ECHOCARDIOGRAM, TRANSESOPHAGEAL
Anesthesia: Monitor Anesthesia Care

## 2019-06-19 MED ORDER — PROPOFOL 500 MG/50ML IV EMUL
INTRAVENOUS | Status: DC | PRN
  Administered 2019-06-19: 125 ug/kg/min via INTRAVENOUS

## 2019-06-19 MED ORDER — METOPROLOL TARTRATE 25 MG PO TABS
25.0000 mg | ORAL_TABLET | Freq: Once | ORAL | Status: AC
  Administered 2019-06-19: 25 mg via ORAL
  Filled 2019-06-19: qty 1

## 2019-06-19 MED ORDER — METOPROLOL TARTRATE 50 MG PO TABS
50.0000 mg | ORAL_TABLET | Freq: Two times a day (BID) | ORAL | Status: DC
  Administered 2019-06-19 – 2019-06-20 (×2): 50 mg via ORAL
  Filled 2019-06-19 (×2): qty 1

## 2019-06-19 MED ORDER — SODIUM CHLORIDE 0.9 % IV SOLN: INTRAVENOUS | Status: DC

## 2019-06-19 MED ORDER — METOPROLOL TARTRATE 50 MG PO TABS: 50.0000 mg | ORAL_TABLET | Freq: Two times a day (BID) | ORAL | Status: DC

## 2019-06-19 MED ORDER — PROPOFOL 10 MG/ML IV BOLUS
INTRAVENOUS | Status: DC | PRN
  Administered 2019-06-19: 30 mg via INTRAVENOUS

## 2019-06-19 MED ORDER — WHITE PETROLATUM EX OINT
TOPICAL_OINTMENT | CUTANEOUS | Status: AC
  Administered 2019-06-19: 0.2
  Filled 2019-06-19: qty 28.35

## 2019-06-19 MED ORDER — APIXABAN 5 MG PO TABS
5.0000 mg | ORAL_TABLET | Freq: Two times a day (BID) | ORAL | Status: DC
  Administered 2019-06-19 – 2019-06-20 (×3): 5 mg via ORAL
  Filled 2019-06-19 (×3): qty 1

## 2019-06-19 NOTE — Telephone Encounter (Signed)
Patient is currently in the hospital, son would like to speak to Dr. Caryl Comes about his mother.

## 2019-06-19 NOTE — Progress Notes (Signed)
SLP Cancellation Note  Patient Details Name: [name] MRN: [id number] DOB: [birth year]   Cancelled treatment:       Reason Eval/Treat Not Completed: Patient at procedure or test/unavailable.  SLP will f/u as schedule allows.    [name] [name] 06/19/2019, 11:51 AM

## 2019-06-19 NOTE — Discharge Instructions (Signed)
Heart Healthy Nutrition Therapy   To follow a heart-healthy  diet, . Eat a balanced diet with whole grains, fruits and vegetables, and lean protein sources.  . Choose heart-healthy unsaturated fats. Limit saturated fats, trans fats, and cholesterol intake. Eat more plant-based or vegetarian meals using beans and soy foods for protein.  . Eat whole, unprocessed foods to limit the amount of sodium (salt) you eat.  . Choose a consistent amount of carbohydrate at each meal and snack. Limit refined carbohydrates especially sugar, sweets and sugar-sweetened beverages.  . If you drink alcohol, do so in moderation: one serving per day (women) and two servings per day (men). o One serving is equivalent to 12 ounces beer, 5 ounces wine, or 1.5 ounces distilled spirits  Tips Tips for Choosing Heart-Healthy Fats Choose lean protein and low-fat dairy foods to reduce saturated fat intake. . Saturated fat is usually found in animal-based protein and is associated with certain health risks. Saturated fat is the biggest contributor to raise low-density lipoprotein (LDL) cholesterol levels. Research shows that limiting saturated fat lowers unhealthy cholesterol levels. Eat no more than 7% of your total calories each day from saturated fat. Ask your RDN to help you determine how much saturated fat is right for you. . There are many foods that do not contain large amounts of saturated fats. Swapping these foods to replace foods high in saturated fats will help you limit the saturated fat you eat and improve your cholesterol levels. You can also try eating more plant-based or vegetarian meals. Instead of. Try:  Whole milk, cheese, yogurt, and ice cream 1% or skim milk, low-fat cheese, non-fat yogurt, and low-fat ice cream  Fatty, marbled beef and pork Lean beef, pork, or venison  Poultry with skin Poultry without skin  Butter, stick margarine Reduced-fat, whipped, or liquid spreads  Coconut oil, palm oil Liquid  vegetable oils: corn, canola, olive, soybean and safflower oils   Avoid foods that contain trans fats. . Trans fats increase levels of LDL-cholesterol. Hydrogenated fat in processed foods is the main source of trans fats in foods.  . Trans fats can be found in stick margarine, shortening, processed sweets, baked goods, some fried foods, and packaged foods made with hydrogenated oils. Avoid foods with "partially hydrogenated oil" on the ingredient list such as: cookies, pastries, baked goods, biscuits, crackers, microwave popcorn, and frozen dinners. Choose foods with heart healthy fats. . Polyunsaturated and monounsaturated fat are unsaturated fats that may help lower your blood cholesterol level when used in place of saturated fat in your diet. . Ask your RDN about taking a dietary supplement with plant sterols and stanols to help lower your cholesterol level. [name] Research shows that substituting saturated fats with unsaturated fats is beneficial to cholesterol levels. Try these easy swaps: Instead of. Try:  Butter, stick margarine, or solid shortening Reduced-fat, whipped, or liquid spreads  Beef, pork, or poultry with skin Fish and seafood  Chips, crackers, snack foods Raw or unsalted nuts and seeds or nut butters Hummus with vegetables Avocado on toast  Coconut oil, palm oil Liquid vegetable oils: corn, canola, olive, soybean and safflower oils  Limit the amount of cholesterol you eat to less than 200 milligrams per day. . Cholesterol is a substance carried through the bloodstream via lipoproteins, which are known as "transporters" of fat. Some body functions need cholesterol to work properly, but too much cholesterol in the bloodstream can damage arteries and build up blood vessel linings (which can lead to heart  attack and stroke). You should eat less than 200 milligrams cholesterol per day. [name] People respond differently to eating cholesterol. There is no test available right now that can figure  out which people will respond more to dietary cholesterol and which will respond less. For individuals with high intake of dietary cholesterol, different types of increase (none, small, moderate, large) in LDL-cholesterol levels are all possible.  . Food sources of cholesterol include egg yolks and organ meats such as liver, gizzards. Limit egg yolks to two to four per week and avoid organ meats like liver and gizzards to control cholesterol intake. Tips for Choosing Heart-Healthy Carbohydrates Consume a consistent amount of carbohydrate . It is important to eat foods with carbohydrates in moderation because they impact your blood glucose level. Carbohydrates can be found in many foods such as: . Grains (breads, crackers, rice, pasta, and cereals)  . Starchy Vegetables (potatoes, corn, and peas)  . Beans and legumes  . Milk, soy milk, and yogurt  . Fruit and fruit juice  . Sweets (cakes, cookies, ice cream, jam and jelly) . Your RDN will help you set a goal for how many carbohydrate servings to eat at your meals and snacks. For many adults, eating 3 to 5 servings of carbohydrate foods at each meal and 1 or 2 carbohydrate servings for each snack works well.  . Check your blood glucose level regularly. It can tell you if you need to adjust when you eat carbohydrates. . Choose foods rich in viscous (soluble) fiber . Viscous, or soluble, is found in the walls of plant cells. Viscous fiber is found only in plant-based foods. Eating foods with fiber helps to lower your unhealthy cholesterol and keep your blood glucose in range  . Rich sources of viscous fiber include vegetables (asparagus, Brussels sprouts, sweet potatoes, turnips) fruit (apricots, mangoes, oranges), legumes, and whole grains (barley, oats, and oat bran).  . As you increase your fiber intake gradually, also increase the amount of water you drink. This will help prevent constipation.  . If you have difficulty achieving this goal, ask your  RDN about fiber laxatives. Choose fiber supplements made with viscous fibers such as psyllium seed husks or methylcellulose to help lower unhealthy cholesterol.  . Limit refined carbohydrates  . There are three types of carbohydrates: starches, sugar, and fiber. Some carbohydrates occur naturally in food, like the starches in rice or corn or the sugars in fruits and milk. Refined carbohydrates--foods with high amounts of simple sugars--can raise triglyceride levels. High triglyceride levels are associated with coronary heart disease. . Some examples of refined carbohydrate foods are table sugar, sweets, and beverages sweetened with added sugar. Tips for Reducing Sodium (Salt) Although sodium is important for your body to function, too much sodium can be harmful for people with high blood pressure. As sodium and fluid buildup in your tissues and bloodstream, your blood pressure increases. High blood pressure may cause damage to other organs and increase your risk for a stroke. Even if you take a pill for blood pressure or a water pill (diuretic) to remove fluid, it is still important to have less salt in your diet. Ask your doctor and RDN what amount of sodium is right for you. [name] Avoid processed foods. Eat more fresh foods.  . Fresh fruits and vegetables are naturally low in sodium, as well as frozen vegetables and fruits that have no added juices or sauces.  . Fresh meats are lower in sodium than processed meats, such as  bacon, sausage, and hotdogs. Read the nutrition label or ask your butcher to help you find a fresh meat that is low in sodium. . Eat less salt--at the table and when cooking.  . A single teaspoon of table salt has 2,300 mg of sodium.  . Leave the salt out of recipes for pasta, casseroles, and soups.  . Ask your RDN how to cook your favorite recipes without sodium . Be a Paramedic.  . Look for food packages that say "salt-free" or "sodium-free." These items contain less than 5  milligrams of sodium per serving.  [name] "Very low-sodium" products contain less than 35 milligrams of sodium per serving.  [name] "Low-sodium" products contain less than 140 milligrams of sodium per serving.  . Beware for "Unsalted" or "No Added Salt" products. These items may still be high in sodium. Check the nutrition label. . Add flavors to your food without adding sodium.  . Try lemon juice, lime juice, fruit juice or vinegar.  . Dry or fresh herbs add flavor. Try basil, bay leaf, dill, rosemary, parsley, sage, dry mustard, nutmeg, thyme, and paprika.  . Pepper, red pepper flakes, and cayenne pepper can add spice t your meals without adding sodium. Hot sauce contains sodium, but if you use just a drop or two, it will not add up to much.  Sharyn Lull a sodium-free seasoning blend or make your own at home. Additional Lifestyle Tips Achieve and maintain a healthy weight. . Talk with your RDN or your doctor about what is a healthy weight for you. . Set goals to reach and maintain that weight.  . To lose weight, reduce your calorie intake along with increasing your physical activity. A weight loss of 10 to 15 pounds could reduce LDL-cholesterol by 5 milligrams per deciliter. Participate in physical activity. . Talk with your health care team to find out what types of physical activity are best for you. Set a plan to get about 30 minutes of exercise on most days.  Foods Recommended Food Group Foods Recommended  Grains Whole grain breads and cereals, including whole wheat, barley, rye, buckwheat, corn, teff, quinoa, millet, amaranth, brown or wild rice, sorghum, and oats Pasta, especially whole wheat or other whole grain types  [employer], quinoa or wild rice Whole grain crackers, bread, rolls, pitas Home-made bread with reduced-sodium baking soda  Protein Foods Lean cuts of beef and pork (loin, leg, round, extra lean hamburger)  Skinless Cytogeneticist and other wild game Dried beans and  peas Nuts and nut butters Meat alternatives made with soy or textured vegetable protein  Egg whites or egg substitute Cold cuts made with lean meat or soy protein  Dairy Nonfat (skim), low-fat, or 1%-fat milk  Nonfat or low-fat yogurt or cottage cheese Fat-free and low-fat cheese  Vegetables Fresh, frozen, or canned vegetables without added fat or salt   Fruits Fresh, frozen, canned, or dried fruit   Oils Unsaturated oils (corn, olive, peanut, soy, sunflower, canola)  Soft or liquid margarines and vegetable oil spreads  Salad dressings Seeds and nuts  Avocado   Foods Not Recommended Food Group Foods Not Recommended  Grains Breads or crackers topped with salt Cereals (hot or cold) with more than 300 mg sodium per serving Biscuits, cornbread, and other "quick" breads prepared with baking soda Bread crumbs or stuffing mix from a store High-fat bakery products, such as doughnuts, biscuits, croissants, danish pastries, pies, cookies Instant cooking foods to which you add hot water and stir--potatoes,  noodles, rice, etc. Packaged starchy foods--seasoned noodle or rice dishes, stuffing mix, macaroni and cheese dinner Snacks made with partially hydrogenated oils, including chips, cheese puffs, snack mixes, regular crackers, butter-flavored popcorn  Protein Foods Higher-fat cuts of meats (ribs, t-bone steak, regular hamburger) Bacon, sausage, or hot dogs Cold cuts, such as salami or bologna, deli meats, cured meats, corned beef Organ meats (liver, brains, gizzards, sweetbreads) Poultry with skin Fried or smoked meat, poultry, and fish Whole eggs and egg yolks (more than 2-4 per week) Salted legumes, nuts, seeds, or nut/seed butters Meat alternatives with high levels of sodium (>300 mg per serving) or saturated fat (>5 g per serving)  Dairy Whole milk,?2% fat milk, buttermilk Whole milk yogurt or ice cream Cream Half-&-half Cream cheese Sour cream Cheese  Vegetables Canned or frozen  vegetables with salt, fresh vegetables prepared with salt, butter, cheese, or cream sauce Fried vegetables Pickled vegetables such as olives, pickles, or sauerkraut  Fruits Fried fruits Fruits served with butter or cream  Oils Butter, stick margarine, shortening Partially hydrogenated oils or trans fats Tropical oils (coconut, palm, palm kernel oils)  Other Candy, sugar sweetened soft drinks and desserts Salt, sea salt, garlic salt, and seasoning mixes containing salt Bouillon cubes Ketchup, barbecue sauce, Worcestershire sauce, soy sauce, teriyaki sauce Miso Salsa Pickles, olives, relish   Heart Healthy Consistent Carbohydrate Vegetarian (Lacto-Ovo) Sample 1-Day Menu  Breakfast 1 cup oatmeal, cooked (2 carbohydrate servings)   cup blueberries (1 carbohydrate serving)  11 almonds, without salt  1 cup 1% milk (1 carbohydrate serving)  1 cup coffee  Morning Snack 1 cup fat-free plain yogurt (1 carbohydrate serving)  Lunch 1 whole wheat bun (1 carbohydrate servings)  1 black bean burger (1 carbohydrate servings)  1 slice cheddar cheese, low sodium  2 slices tomatoes  2 leaves lettuce  1 teaspoon mustard  1 small pear (1 carbohydrate servings)  1 cup green tea, unsweetened  Afternoon Snack 1/3 cup trail mix with nuts, seeds, and raisins, without salt (1 carbohydrate servinga)  Evening Meal  cup meatless chicken  2/3 cup brown rice, cooked (2 carbohydrate servings)  1 cup broccoli, cooked (2/3 carbohydrate serving)   cup carrots, cooked (1/3 carbohydrate serving)  2 teaspoons olive oil  1 teaspoon balsamic vinegar  1 whole wheat dinner roll (1 carbohydrate serving)  1 teaspoon margarine, soft, tub  1 cup 1% milk (1 carbohydrate serving)  Evening Snack 1 extra small banana (1 carbohydrate serving)  1 tablespoon peanut butter   Heart Healthy Consistent Carbohydrate Vegan Sample 1-Day Menu  Breakfast 1 cup oatmeal, cooked (2 carbohydrate servings)   cup blueberries (1  carbohydrate serving)  11 almonds, without salt  1 cup soymilk fortified with calcium, vitamin B12, and vitamin D  1 cup coffee  Morning Snack 6 ounces soy yogurt (1 carbohydrate servings)  Lunch 1 whole wheat bun(1 carbohydrate servings)  1 black bean burger (1 carbohydrate serving)  2 slices tomatoes  2 leaves lettuce  1 teaspoon mustard  1 small pear (1 carbohydrate servings)  1 cup green tea, unsweetened  Afternoon Snack 1/3 cup trail mix with nuts, seeds, and raisins, without salt (1 carbohydrate servings)  Evening Meal  cup meatless chicken  2/3 cup brown rice, cooked (2 carbohydrate servings)  1 cup broccoli, cooked (2/3 carbohydrate serving)   cup carrots, cooked (1/3 carbohydrate serving)  2 teaspoons olive oil  1 teaspoon balsamic vinegar  1 whole wheat dinner roll (1 carbohydrate serving)  1 teaspoon margarine, soft, tub  1 cup soymilk fortified with calcium, vitamin B12, and vitamin D  Evening Snack 1 extra small banana (1 carbohydrate serving)  1 tablespoon peanut butter    Heart Healthy Consistent Carbohydrate Sample 1-Day Menu  Breakfast 1 cup cooked oatmeal (2 carbohydrate servings)  3/4 cup blueberries (1 carbohydrate serving)  1 ounce almonds  1 cup skim milk (1 carbohydrate serving)  1 cup coffee  Morning Snack 1 cup sugar-free nonfat yogurt (1 carbohydrate serving)  Lunch 2 slices whole-wheat bread (2 carbohydrate servings)  2 ounces lean [place] breast  1 ounce low-fat Swiss cheese  1 teaspoon mustard  1 slice tomato  1 lettuce leaf  1 small pear (1 carbohydrate serving)  1 cup skim milk (1 carbohydrate serving)  Afternoon Snack 1 ounce trail mix with unsalted nuts, seeds, and raisins (1 carbohydrate serving)  Evening Meal 3 ounces salmon  2/3 cup cooked brown rice (2 carbohydrate servings)  1 teaspoon soft margarine  1 cup cooked broccoli with 1/2 cup cooked carrots (1 carbohydrate serving  Carrots, cooked, boiled, drained, without salt  1 cup  lettuce  1 teaspoon olive oil with vinegar for dressing  1 small whole grain roll (1 carbohydrate serving)  1 teaspoon soft margarine  1 cup unsweetened tea  Evening Snack 1 extra-small banana (1 carbohydrate serving)  Copyright 2020  Academy of [employer] Dietetics. All rights reserved.  Information on my medicine - ELIQUIS (apixaban)  This medication education was reviewed with me or my healthcare representative as part of my discharge preparation.   Why was Eliquis prescribed for you? Eliquis was prescribed for you to reduce the risk of a blood clot forming that can cause a stroke if you have a medical condition called atrial fibrillation (a type of irregular heartbeat).  What do You need to know about Eliquis ? Take your Eliquis TWICE DAILY - one tablet in the morning and one tablet in the evening with or without food. If you have difficulty swallowing the tablet whole please discuss with your pharmacist how to take the medication safely.  Take Eliquis exactly as prescribed by your doctor and DO NOT stop taking Eliquis without talking to the doctor who prescribed the medication.  Stopping may increase your risk of developing a stroke.  Refill your prescription before you run out.  After discharge, you should have regular check-up appointments with your healthcare provider that is prescribing your Eliquis.  In the future your dose may need to be changed if your kidney function or weight changes by a significant amount or as you get older.  What do you do if you miss a dose? If you miss a dose, take it as soon as you remember on the same day and resume taking twice daily.  Do not take more than one dose of ELIQUIS at the same time to make up a missed dose.  Important Safety Information A possible side effect of Eliquis is bleeding. You should call your healthcare provider right away if you experience any of the following: ? Bleeding from an injury or your nose that does not  stop. ? Unusual colored urine (red or dark brown) or unusual colored stools (red or black). ? Unusual bruising for unknown reasons. ? A serious fall or if you hit your head (even if there is no bleeding).  Some medicines may interact with Eliquis and might increase your risk of bleeding or clotting while on Eliquis. To help avoid this, consult your healthcare provider or pharmacist prior to  using any new prescription or non-prescription medications, including herbals, vitamins, non-steroidal anti-inflammatory drugs (NSAIDs) and supplements.  This website has more information on Eliquis (apixaban): http://[email or web address].com/eliquis/home

## 2019-06-19 NOTE — Progress Notes (Signed)
Occupational Therapy Treatment Patient Details Name: [name] MRN: [id number] DOB: 19-Nov-1932 Today's Date: 06/19/2019    History of present illness 86 y.o. female with medical history significant of s/p MVR; hypothyroidism; HLD; TIA x 2; chronic diastolic CHF; afib, and mild carotid stenosis. Presenting with concern for CVA.   OT comments  Patient continues to make steady progress towards goals in skilled OT session. Patient's session encompassed assessment of higher level cognitive tasks, as well as handout provided for LUE fine motor coordination. Pt continues to demonstrate deficits with multi-step recall tasks, however agreeable to strategies to keeping cognitively engaged upon discharge to aid in progression. Pt demonstrated ability to complete LUE fine motor coordination exercises as explained by handout, and further education provided on stroke signs and potential areas of impairment when at home (encouraged to inform outpatient OT of deficits to address in therapy). Will continue to follow acutely.    Follow Up Recommendations  Outpatient OT    Equipment Recommendations  None recommended by OT    Recommendations for Other Services      Precautions / Restrictions Precautions Precautions: Fall Precaution Comments: watch HR  Restrictions Weight Bearing Restrictions: No       Mobility Bed Mobility Overal bed mobility: Independent                Transfers Overall transfer level: Independent Equipment used: None                  Balance Overall balance assessment: No apparent balance deficits (not formally assessed)                                         ADL either performed or assessed with clinical judgement   ADL                                         General ADL Comments: pt seated EOB on arrival, eduacation provided in regards to HEP for LUE as well as stroke education for discharge home and completion of  therapy at outpatient setting     Vision Baseline Vision/History: Wears glasses Wears Glasses: Reading only Additional Comments: Vision not formally assessed, however pt able to locate contacts on phone with no visual difficulty noted   Perception     Praxis      Cognition Arousal/Alertness: Awake/alert Behavior During Therapy: WFL for tasks assessed/performed Overall Cognitive Status: Within Functional Limits for tasks assessed                                 General Comments: WFL for basic tasks, admits she has a harder time recalling a sequence of tasks however states "those statements arent important to me, if I had to remember I believe I could" Pt was able to appropriatley repeat sequence with 66% accuracy when tested and continues to acknowledge she should not be driving at this time        Exercises     Shoulder Instructions       General Comments      Pertinent Vitals/ Pain       Pain Assessment: No/denies pain  Home Living  Prior Functioning/Environment              Frequency  Min 2X/week        Progress Toward Goals  OT Goals(current goals can now be found in the care plan section)  Progress towards OT goals: Progressing toward goals  Acute Rehab OT Goals Patient Stated Goal: Get well; back to home and normal OT Goal Formulation: With patient Time For Goal Achievement: 07/01/19 Potential to Achieve Goals: Good  Plan Discharge plan remains appropriate    Co-evaluation                 AM-PAC OT "6 Clicks" Daily Activity     Outcome Measure   Help from another person eating meals?: None Help from another person taking care of personal grooming?: None Help from another person toileting, which includes using toliet, bedpan, or urinal?: None Help from another person bathing (including washing, rinsing, drying)?: None Help from another person to put on and taking  off regular upper body clothing?: None Help from another person to put on and taking off regular lower body clothing?: A Little 6 Click Score: 23    End of Session    OT Visit Diagnosis: Other symptoms and signs involving the nervous system (R29.898)   Activity Tolerance Patient tolerated treatment well   Patient Left in bed;with call bell/phone within reach;with bed alarm set;with nursing/sitter in room   Nurse Communication Mobility status        Time: [id number] OT Time Calculation (min): 20 min  Charges: OT General Charges $OT Visit: 1 Visit OT Treatments $Self Care/Home Management : 8-22 mins  Corinne Ports E. Jediah Horger, COTA/L Acute Rehabilitation Services [phone] [place] 06/19/2019, 10:25 AM

## 2019-06-19 NOTE — H&P (Signed)
   INTERVAL PROCEDURE H&P  History and Physical Interval Note:  06/19/2019 11:23 AM  [name] has presented today for their planned procedure. The various methods of treatment have been discussed with the patient and family. After consideration of risks, benefits and other options for treatment, the patient has consented to the procedure.  The patients' outpatient history has been reviewed, patient examined, and no change in status from most recent office note within the past 30 days. I have reviewed the patients' chart and labs and will proceed as planned. Questions were answered to the patient's satisfaction.   Pixie Casino, MD, [facility], [place] Director of the [facility]  [facility] Diplomate of the American Board of Clinical Lipidology Attending Cardiologist  Direct Dial: [phone]  Fax: [phone]  Website:  [email or web address] 06/19/2019, 11:23 AM

## 2019-06-19 NOTE — Anesthesia Postprocedure Evaluation (Signed)
Anesthesia Post Note  Patient: [name]  Procedure(s) Performed: TRANSESOPHAGEAL ECHOCARDIOGRAM (TEE) (N/A ) BUBBLE STUDY     Patient location during evaluation: Endoscopy Anesthesia Type: MAC Level of consciousness: awake and alert Pain management: pain level controlled Vital Signs Assessment: post-procedure vital signs reviewed and stable Respiratory status: spontaneous breathing, nonlabored ventilation, respiratory function stable and patient connected to nasal cannula oxygen Cardiovascular status: blood pressure returned to baseline and stable Postop Assessment: no apparent nausea or vomiting Anesthetic complications: no    Last Vitals:  Vitals:   06/19/19 1219 06/19/19 1247  BP: (!) 100/38 125/79  Pulse: 72 69  Resp: (!) 21 12  Temp:  36.4 C  SpO2: 95% 97%    Last Pain:  Vitals:   06/19/19 1247  TempSrc: Oral  PainSc:                  [place]

## 2019-06-19 NOTE — Transfer of Care (Signed)
Immediate Anesthesia Transfer of Care Note  Patient: [name]  Procedure(s) Performed: TRANSESOPHAGEAL ECHOCARDIOGRAM (TEE) (N/A ) BUBBLE STUDY  Patient Location: Endoscopy Unit  Anesthesia Type:MAC  Level of Consciousness: drowsy and patient cooperative  Airway & Oxygen Therapy: Patient Spontanous Breathing  Post-op Assessment: Report given to RN, Post -op Vital signs reviewed and stable and Patient moving all extremities X 4  Post vital signs: Reviewed and stable  Last Vitals:  Vitals Value Taken Time  BP 104/44 06/19/19 1210  Temp 36.4 C 06/19/19 1210  Pulse 84 06/19/19 1216  Resp 27 06/19/19 1216  SpO2 95 % 06/19/19 1216  Vitals shown include unvalidated device data.  Last Pain:  Vitals:   06/19/19 1210  TempSrc: Temporal  PainSc: Asleep         Complications: No apparent anesthesia complications

## 2019-06-19 NOTE — Progress Notes (Signed)
Cardiology Progress Note  Patient ID: [name] MRN: [id number] DOB: 07/28/1932 Date of Encounter: 06/19/2019  Primary Cardiologist: No primary care provider on file.  Subjective  Heart rates much improved with metoprolol.  I transitioned her to Eliquis.  Plan for TEE today.  Discharge pending TEE.  Denies chest pain, shortness of breath, palpitations.  ROS:  All other ROS reviewed and negative. Pertinent positives noted in the HPI.     Inpatient Medications  Scheduled Meds: . white petrolatum      . apixaban  5 mg Oral BID  . aspirin EC  81 mg Oral Daily  . ezetimibe  10 mg Oral Daily  . levothyroxine  25 mcg Oral QAC breakfast  . metoprolol tartrate  25 mg Oral Q6H   Continuous Infusions:  PRN Meds: acetaminophen **OR** acetaminophen (TYLENOL) oral liquid 160 mg/5 mL **OR** acetaminophen, albuterol, diphenhydrAMINE, polyvinyl alcohol, senna-docusate   Vital Signs   Vitals:   06/18/19 1610 06/18/19 1959 06/18/19 2337 06/19/19 0346  BP: 112/64 125/63 121/64 125/71  Pulse: 86 83 84 85  Resp: 19 19 19 19   Temp: 97.6 F (36.4 C) 98.1 F (36.7 C) 98.4 F (36.9 C) 98 F (36.7 C)  TempSrc: Oral Oral Oral Oral  SpO2: 96% 97% 97% 97%  Weight:      Height:        Intake/Output Summary (Last 24 hours) at 06/19/2019 0903 Last data filed at 06/18/2019 0934 Gross per 24 hour  Intake 240 ml  Output --  Net 240 ml   Last 3 Weights 06/16/2019 05/26/2019 01/26/2019  Weight (lbs) 182 lb 194 lb 187 lb 9.6 oz  Weight (kg) 82.555 kg 87.998 kg 85.095 kg      Telemetry  Overnight telemetry shows atrial fibrillation with heart rate in the 70-90 range, which I personally reviewed.   ECG  The most recent ECG shows atrial fibrillation, heart rate 83, which I personally reviewed.   Physical Exam   Vitals:   06/18/19 1610 06/18/19 1959 06/18/19 2337 06/19/19 0346  BP: 112/64 125/63 121/64 125/71  Pulse: 86 83 84 85  Resp: 19 19 19 19   Temp: 97.6 F (36.4 C) 98.1 F (36.7 C)  98.4 F (36.9 C) 98 F (36.7 C)  TempSrc: Oral Oral Oral Oral  SpO2: 96% 97% 97% 97%  Weight:      Height:         Intake/Output Summary (Last 24 hours) at 06/19/2019 0903 Last data filed at 06/18/2019 0934 Gross per 24 hour  Intake 240 ml  Output --  Net 240 ml    Last 3 Weights 06/16/2019 05/26/2019 01/26/2019  Weight (lbs) 182 lb 194 lb 187 lb 9.6 oz  Weight (kg) 82.555 kg 87.998 kg 85.095 kg    Body mass index is 30.29 kg/m.  General: Well nourished, well developed, in no acute distress Head: Atraumatic, normal size  Eyes: PEERLA, EOMI  Neck: Supple, no JVD Endocrine: No thryomegaly Cardiac: Irregular rhythm, no m/r/g Lungs: Clear to auscultation bilaterally, no wheezing, rhonchi or rales  Abd: Soft, nontender, no hepatomegaly  Ext: No edema, pulses 2+ Musculoskeletal: No deformities, BUE and BLE strength normal and equal Skin: Warm and dry, no rashes   Neuro: Alert and oriented to person, place, time, and situation, CNII-XII grossly intact, no focal deficits  Psych: Normal mood and affect   Labs  High Sensitivity Troponin:  No results for input(s): TROPONINIHS in the last 720 hours.   Cardiac EnzymesNo results for input(s):  TROPONINI in the last 168 hours. No results for input(s): TROPIPOC in the last 168 hours.  Chemistry Recent Labs  Lab 06/16/19 0910 06/16/19 0934 06/19/19 0452  NA 141 142 142  K 4.2 4.0 4.0  CL 106 106 106  CO2 23  --  25  GLUCOSE 104* 98 92  BUN 14 15 13   CREATININE 0.78 0.70 0.82  CALCIUM 9.0  --  8.8*  PROT 6.8  --  6.1*  ALBUMIN 3.5  --  3.1*  AST 26  --  15  ALT 19  --  14  ALKPHOS 40  --  37*  BILITOT 0.9  --  0.9  GFRNONAA >60  --  >60  GFRAA >60  --  >60  ANIONGAP 12  --  11    Hematology Recent Labs  Lab 06/16/19 0910 06/16/19 0934 06/19/19 0452  WBC 4.5  --  4.7  RBC 4.37  --  3.99  HGB 12.4 12.2 11.2*  HCT 39.0 36.0 35.9*  MCV 89.2  --  90.0  MCH 28.4  --  28.1  MCHC 31.8  --  31.2  RDW 14.6  --  14.6  PLT 147*   --  151   BNPNo results for input(s): BNP, PROBNP in the last 168 hours.  DDimer No results for input(s): DDIMER in the last 168 hours.   Radiology  No results found.  Cardiac Studies  TTE 06/16/2019 1. Left ventricular ejection fraction, by estimation, is 55 to 60%. The  left ventricle has normal function. The left ventricle has no regional  wall motion abnormalities. Left ventricular diastolic function could not  be evaluated.  2. Right ventricular systolic function is normal. The right ventricular  size is normal. There is mildly elevated pulmonary artery systolic  pressure. The estimated right ventricular systolic pressure is [id number] mmHg.  3. Left atrial size was severely dilated.  4. A mitral valve annuloplasty ring is present. There is a heavily  calcified focal mass-like structure present on the PMVL. This is likely  deterioration of the PMVL with mitral annular calcification. There are no  mobile components to it. There is no  apparent destruction of the valve. There is mild to moderate MR. There is  mild mitral stenosis. This is best viewed in the PLAX. When compared with  the prior echo this was present but not specifically commented on. The  mitral valve has been  repaired/replaced. Mild to moderate mitral valve regurgitation. The mean  mitral valve gradient is 6.3 mmHg with average heart rate of 80 bpm. There  is a prosthetic annuloplasty ring present in the mitral position.  Procedure Date: 10/23/1998.  5. The aortic valve is tricuspid. Aortic valve regurgitation is not  visualized. Mild aortic valve sclerosis is present, with no evidence of  aortic valve stenosis.  6. The inferior vena cava is dilated in size with <50% respiratory  variability, suggesting right atrial pressure of 15 mmHg.   Patient Profile  [name] is a 86 y.o. female with history of mitral valve repair, permanent atrial fibrillation with difficult to control rates who was admitted on  06/16/2019 for acute right MCA stroke.  Subsequently found to have a calcified mass on the posterior mitral valve leaflet.  Assessment & Plan   1.  Acute stroke with mitral valve mass -Appears to have a large calcified mass on the posterior mitral valve leaflet.  Could be calcifications related to prior mitral valve repair.  Given recent stroke we  will proceed with a TEE today. -Transition to Eliquis today.  Was subtherapeutic on INR 1 came in.  This could also be the etiology of her stroke.  2.  Permanent atrial fibrillation -I consolidated her metoprolol to 50 mg tartrate twice daily.  Eliquis as above. -She has an appointment on 06/29/2019 with Dr. Jolyn Nap to discuss AV nodal ablation and pacemaker.  3. HLD -Reportedly statin intolerant.  She should be considered for PCSK9 inhibitors as outpatient.  I will add Zetia while she is here.  For questions or updates, please contact [place] Please consult [email or web address] for contact info under   Time Spent with Patient: I have spent a total of 35 minutes with patient reviewing hospital notes, telemetry, EKGs, labs and examining the patient as well as establishing an assessment and plan that was discussed with the patient.  > 50% of time was spent in direct patient care.    Signed, Addison Naegeli. Audie Box, [place]  06/19/2019 9:03 AM

## 2019-06-19 NOTE — Progress Notes (Signed)
PROGRESS NOTE    [name]  QQP:[id number] DOB: [birth year] DOA: 06/16/2019 PCP: Abner Greenspan, MD    Brief Narrative:  86 year old lady with prior history of mitral valve repair, permanent atrial fibrillation on anticoagulation, chronic diastolic heart failure, carotid stenosis, hypothyroidism, hyperlipidemia, TIA presents with tingling face and left upper extremity.  Code stroke was called but patient was out of window for TPA.  She was admitted for evaluation of a stroke.  Initial CT of the head no any acute intracranial abnormality. MRI of the brain shows Acute infarct in the posterior Right MCA territory.  CT angiogram of the head and neck showed possible occlusion of mid to distal right M2 MCA branch vessel.  Bilateral common and internal carotid arteries are patent without significant stenosis.  Bilateral vertebral arteries are patent within the neck.  Mixed plaque results in mild stenosis at the origin of the left vertebral artery.  CT perfusion study identifies a 12 mm region of critically hypoperfused parenchyma within the right parietal lobe no core infarct is identified.  Echocardiogram does not show any cardiac source of emboli.  A TEE was performed did not show any thrombus but evidence of left atrial smoke present along with calcification of mitral valve.  Neurology and cardiology recommends to switch warfarin to Eliquis.  Patient would like to stay overnight and speak with Dr. Caryl Comes prior to being discharged tomorrow.   Patient seen and examined at bedside reports her symptoms of tingling of the face and sensory deficits in the left arm have improved.  And she is close to her baseline.  Assessment & Plan:   Principal Problem:   CVA (cerebral vascular accident) [facility]) Active Problems:   Hypothyroidism   Hyperlipidemia   Atrial fibrillation ([place])   (HFpEF) heart failure with preserved ejection fraction ([place])   Acute Infarct in the posterior right MCA territory Neurology  consulted.   MRI brain shows acute infarct in the posterior MCA territory.  Echocardiogram showed LV EF of 55%, no regional wall abnormalities. A mitral valve annuloplasty ring is present. There is a heavily calcified focal mass-like structure present on the PMVL. This is likely deterioration of the PMVL with mitral annular calcification. There are no mobile components to it.   CT angiogram of the head and neck showed possible occlusion of mid to distal right M2 MCA branch vessel.  Bilateral common and internal carotid arteries are patent without significant stenosis.  Bilateral vertebral arteries are patent within the neck.  Mixed plaque results in mild stenosis at the origin of the left vertebral artery.    CT perfusion study identifies a 12 mm region of critically hypoperfused parenchyma within the right parietal lobe no core infarct is identified.   Echocardiogram does not show any cardiac source of emboli.  A TEE was performed did not show any thrombus but evidence of left atrial smoke present along with calcification of mitral valve.   Neurology and cardiology recommends to switch warfarin to Eliquis.   Therapy evaluations recommending no PT follow up  Hemoglobin A1c at 5.8 LDL is about 169.  Zetia added by cardiology.    Chronic diastolic heart failure Echocardiogram this admission could not assess diastolic dysfunction.  She appears to be compensated.    Chronic atrial fibrillation  S/p ablation and cardioversion twice as per the patient.  Patient has been off and on Cardizem and has been intolerant to it.   Cardiology started the patient on metoprolol 50 mg BID.  Plan for  AV nodal ablation and pacemaker placement to be scheduled as an outpatient. Pt would like to     Essential hypertension Well controlled.    Hypothyroidism Continue with Synthroid. TSH within normal limits  S/p mitral valve repair. Echocardiogram shows there is a heavily calcified focal mass-like structure  present on the PMVL. This is likely deterioration of the PMVL with mitral annular calcification. TEE shows smoke in the left arium,no thrombus. Coumadin switched to Eliquis.  Patient would like to talk to Dr. Caryl Comes in the morning before discharge    DVT prophylaxis: coumadin.  Code Status: Full code Family Communication: none at bedside.  Disposition Plan:  . Patient came from: Home            . Anticipated d/c place: Home.  . Barriers to d/c OR conditions which need to be met to effect a safe d/c: Discharge tomorrow   Consultants:   Cardiology  Neurology  Procedures: MRI of the brain without contrast Antimicrobials: None  Subjective: Patient denies any chest pain or shortness of breath, reports her tingling of the face has improved but not completely resolved sensation over the left arm has improved.  She denies any nausea or vomiting or abdominal pain or dizziness.  She really does not want to be discharged today wants to speak with Dr. Caryl Comes in the morning. Objective: Vitals:   06/19/19 1046 06/19/19 1210 06/19/19 1219 06/19/19 1247  BP: (!) 147/85 (!) 104/44 (!) 100/38 125/79  Pulse:  83 72 69  Resp: 12 14 (!) 21 12  Temp: 98.7 F (37.1 C) (!) 97.5 F (36.4 C)  97.6 F (36.4 C)  TempSrc: Oral Temporal  Oral  SpO2: 97% (!) 88% 95% 97%  Weight: 83.9 kg     Height: '5\' 5"'  (1.651 m)       Intake/Output Summary (Last 24 hours) at 06/19/2019 1613 Last data filed at 06/19/2019 1205 Gross per 24 hour  Intake 200 ml  Output --  Net 200 ml   Filed Weights   06/16/19 0853 06/19/19 1046  Weight: 82.6 kg 83.9 kg    Examination:  General exam: Alert and comfortable, not in any kind of distress. Respiratory system: Clear to auscultation bilaterally, no wheezing or rhonchi Cardiovascular system: S1-S2 heard, irregularly irregular, no JVD, no pedal edema Gastrointestinal system: Soft, nontender bowel sounds normal Central nervous system: Alert and oriented Extremities: No  pedal edema Skin: No rashes seen Psychiatry: Mood is appropriate   Data Reviewed: I have personally reviewed following labs and imaging studies  CBC: Recent Labs  Lab 06/16/19 0910 06/16/19 0934 06/19/19 0452  WBC 4.5  --  4.7  NEUTROABS 2.8  --   --   HGB 12.4 12.2 11.2*  HCT 39.0 36.0 35.9*  MCV 89.2  --  90.0  PLT 147*  --  409   Basic Metabolic Panel: Recent Labs  Lab 06/16/19 0910 06/16/19 0934 06/19/19 0452  NA 141 142 142  K 4.2 4.0 4.0  CL 106 106 106  CO2 23  --  25  GLUCOSE 104* 98 92  BUN '14 15 13  ' CREATININE 0.78 0.70 0.82  CALCIUM 9.0  --  8.8*   GFR: Estimated Creatinine Clearance: 54.7 mL/min (by C-G formula based on SCr of 0.82 mg/dL). Liver Function Tests: Recent Labs  Lab 06/16/19 0910 06/19/19 0452  AST 26 15  ALT 19 14  ALKPHOS 40 37*  BILITOT 0.9 0.9  PROT 6.8 6.1*  ALBUMIN 3.5 3.1*   No  results for input(s): LIPASE, AMYLASE in the last 168 hours. No results for input(s): AMMONIA in the last 168 hours. Coagulation Profile: Recent Labs  Lab 06/16/19 0910 06/17/19 0823 06/18/19 0316 06/19/19 0452  INR 1.9* 1.8* 1.9* 1.8*   Cardiac Enzymes: No results for input(s): CKTOTAL, CKMB, CKMBINDEX, TROPONINI in the last 168 hours. BNP (last 3 results) No results for input(s): PROBNP in the last 8760 hours. HbA1C: Recent Labs    06/17/19 0432  HGBA1C 5.8*   CBG: Recent Labs  Lab 06/16/19 0842  GLUCAP 94   Lipid Profile: Recent Labs    06/17/19 0432  CHOL 218*  HDL 28*  LDLCALC 169*  TRIG 104  CHOLHDL 7.8   Thyroid Function Tests: Recent Labs    06/16/19 1659  TSH 3.817   Anemia Panel: No results for input(s): VITAMINB12, FOLATE, FERRITIN, TIBC, IRON, RETICCTPCT in the last 72 hours. Sepsis Labs: No results for input(s): PROCALCITON, LATICACIDVEN in the last 168 hours.  Recent Results (from the past 240 hour(s))  SARS CORONAVIRUS 2 (TAT 6-24 HRS) Nasopharyngeal Nasopharyngeal Swab     Status: None   Collection  Time: 06/16/19 10:40 AM   Specimen: Nasopharyngeal Swab  Result Value Ref Range Status   SARS Coronavirus 2 NEGATIVE NEGATIVE Final    Comment: (NOTE) SARS-CoV-2 target nucleic acids are NOT DETECTED. The SARS-CoV-2 RNA is generally detectable in upper and lower respiratory specimens during the acute phase of infection. Negative results do not preclude SARS-CoV-2 infection, do not rule out co-infections with other pathogens, and should not be used as the sole basis for treatment or other patient management decisions. Negative results must be combined with clinical observations, patient history, and epidemiological information. The expected result is Negative. Fact Sheet for Patients: SugarRoll.be Fact Sheet for Healthcare Providers: https://www.woods-mathews.com/ This test is not yet approved or cleared by the [place] FDA and  has been authorized for detection and/or diagnosis of SARS-CoV-2 by FDA under an Emergency Use Authorization (EUA). This EUA will remain  in effect (meaning this test can be used) for the duration of the COVID-19 declaration under Section 56 4(b)(1) of the Act, 21 U.S.C. section 360bbb-3(b)(1), unless the authorization is terminated or revoked sooner. Performed at [facility], [place] [address] [place], [place] [address]          Radiology Studies: ECHO TEE  Result Date: 06/19/2019    TRANSESOPHOGEAL ECHO REPORT   Patient Name:   [name] Date of Exam: 06/19/2019 Medical Rec #:  [id number]     Height:       65.0 in Accession #:    [id number]    Weight:       185.0 lb Date of Birth:  Jul 28, 1932      BSA:          1.914 m Patient Age:    47 years      BP:           147/85 mmHg Patient Gender: F             HR:           87 bpm. Exam Location:  Inpatient Procedure: Transesophageal Echo, Color Doppler and Cardiac Doppler Indications:     Stroke  History:         Patient has prior history of Echocardiogram  examinations, most                  recent 06/16/2019. Mitral Valve Disease, Arrythmias:Atrial  Fibrillation; Risk Factors:Dyslipidemia.                   Mitral Valve: prosthetic annuloplasty ring valve is present in                  the mitral position. Procedure Date: 10/23/1998.  Sonographer:     Mikki Santee RDCS (AE) Referring Phys:  [id number] Abigail Butts Diagnosing Phys: Lyman Bishop MD PROCEDURE: After discussion of the risks and benefits of a TEE, an informed consent was obtained from the patient. The transesophogeal probe was passed without difficulty through the esophogus of the patient. Imaged were obtained with the patient in a supine position. Sedation performed by different physician. The patient was monitored while under deep sedation. Anesthestetic sedation was provided intravenously by Anesthesiology: 239.47m of Propofol. Image quality was excellent. The patient's vital signs; including heart rate, blood pressure, and oxygen saturation; remained stable throughout the procedure. The patient developed no complications during the procedure. IMPRESSIONS  1. Left ventricular ejection fraction, by estimation, is 55 to 60%. The left ventricle has normal function. The left ventricle has no regional wall motion abnormalities. There is mild left ventricular hypertrophy.  2. Right ventricular systolic function is normal. The right ventricular size is normal.  3. Left atrial size was severely dilated. No left atrial/left atrial appendage thrombus was detected.  4. The mitral valve has been repaired/replaced. Mild to moderate mitral valve regurgitation. Mild mitral stenosis. There is a prosthetic annuloplasty ring present in the mitral position. Procedure Date: 10/23/1998.  5. The tricuspid valve is abnormal. Tricuspid valve regurgitation is moderate.  6. The aortic valve is tricuspid. Aortic valve regurgitation is not visualized.  7. Late microbubble contrast suggestive of intrahepatic or  intrapulmonary shunt. FINDINGS  Left Ventricle: Left ventricular ejection fraction, by estimation, is 55 to 60%. The left ventricle has normal function. The left ventricle has no regional wall motion abnormalities. The left ventricular internal cavity size was normal in size. There is  mild left ventricular hypertrophy. Right Ventricle: The right ventricular size is normal. No increase in right ventricular wall thickness. Right ventricular systolic function is normal. Left Atrium: Swirling and low velocity flow noted. Left atrial size was severely dilated. Spontaneous echo contrast was present in the left atrium and left atrial appendage. No left atrial/left atrial appendage thrombus was detected. Right Atrium: Right atrial size was normal in size. Pericardium: There is no evidence of pericardial effusion. Mitral Valve: The mitral valve has been repaired/replaced. There is moderate calcification of the posterior mitral valve leaflet(s). Moderately decreased mobility of the mitral valve leaflets. Mild to moderate mitral valve regurgitation. There is a prosthetic annuloplasty ring present in the mitral position. Procedure Date: 10/23/1998. Mild mitral valve stenosis. Tricuspid Valve: The tricuspid valve is abnormal. Tricuspid valve regurgitation is moderate. Aortic Valve: The aortic valve is tricuspid. Aortic valve regurgitation is not visualized. Pulmonic Valve: The pulmonic valve was grossly normal. Pulmonic valve regurgitation is trivial. Aorta: The aortic root and ascending aorta are structurally normal, with no evidence of dilitation. There is minimal (Grade I) plaque. Venous: The left upper pulmonary vein and left lower pulmonary vein are normal. IAS/Shunts: No atrial level shunt detected by color flow Doppler. Agitated saline contrast was given intravenously to evaluate for intracardiac shunting. Late microbubble contrast suggestive of intrahepatic or intrapulmonary shunt.  TRICUSPID VALVE TR Peak grad:   23.0  mmHg TR Vmax:        240.00 cm/s KLyman BishopMD Electronically signed by  Lyman Bishop MD Signature Date/Time: 06/19/2019/3:05:19 PM    Final         Scheduled Meds: . apixaban  5 mg Oral BID  . aspirin EC  81 mg Oral Daily  . ezetimibe  10 mg Oral Daily  . levothyroxine  25 mcg Oral QAC breakfast  . metoprolol tartrate  50 mg Oral BID   Continuous Infusions:    LOS: 3 days        Hosie Poisson, MD [facility]   To contact the attending provider between 7A-7P or the covering provider during after hours 7P-7A, please log into the web site [email or web address] and access using universal [place] password for that web site. If you do not have the password, please call the hospital operator.  06/19/2019, 4:13 PM

## 2019-06-19 NOTE — Progress Notes (Signed)
STROKE TEAM PROGRESS NOTE      INTERVAL HISTORY I have reviewed history of presenting illness with the patient, imaging films in PACS and electronic medical records.  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  Patient states she is doing a lot better today left hand.  Sensation is a lot improved and speech is improved as well.  Patient has refused switching to newer agents in the past but is now willing to do so.   She had TEE earlier this morning which showed calcification of the mitral valve and evidence of left atrial smoke but no definite clot  OBJECTIVE Vitals:   06/19/19 1046 06/19/19 1210 06/19/19 1219 06/19/19 1247  BP: (!) 147/85 (!) 104/44 (!) 100/38 125/79  Pulse:  83 72 69  Resp: 12 14 (!) 21 12  Temp: 98.7 F (37.1 C) (!) 97.5 F (36.4 C)  97.6 F (36.4 C)  TempSrc: Oral Temporal  Oral  SpO2: 97% (!) 88% 95% 97%  Weight: 83.9 kg     Height: 5\' 5"  (1.651 m)       CBC:  Recent Labs  Lab 06/16/19 0910 06/16/19 0910 06/16/19 0934 06/19/19 0452  WBC 4.5  --   --  4.7  NEUTROABS 2.8  --   --   --   HGB 12.4   < > 12.2 11.2*  HCT 39.0   < > 36.0 35.9*  MCV 89.2  --   --  90.0  PLT 147*  --   --  151   < > = values in this interval not displayed.    Basic Metabolic Panel:  Recent Labs  Lab 06/16/19 0910 06/16/19 0910 06/16/19 0934 06/19/19 0452  NA 141   < > 142 142  K 4.2   < > 4.0 4.0  CL 106   < > 106 106  CO2 23  --   --  25  GLUCOSE 104*   < > 98 92  BUN 14   < > 15 13  CREATININE 0.78   < > 0.70 0.82  CALCIUM 9.0  --   --  8.8*   < > = values in this interval not displayed.    Lipid Panel:     Component Value Date/Time   CHOL 218 (H) 06/17/2019 0432   TRIG 104 06/17/2019 0432   HDL 28 (L) 06/17/2019 0432   CHOLHDL 7.8 06/17/2019 0432   VLDL 21 06/17/2019 0432   LDLCALC 169 (H)  06/17/2019 0432   HgbA1c:  Lab Results  Component Value Date   HGBA1C 5.8 (H) 06/17/2019   Urine Drug Screen:     Component Value Date/Time   LABOPIA NONE DETECTED 06/16/2019 1245   COCAINSCRNUR NONE DETECTED 06/16/2019 1245   LABBENZ NONE DETECTED 06/16/2019 1245   AMPHETMU NONE DETECTED 06/16/2019 1245   THCU NONE DETECTED 06/16/2019 1245   LABBARB NONE DETECTED 06/16/2019 1245    Alcohol Level     Component Value Date/Time   ETH <10 06/16/2019 0910    IMAGING  CT Code Stroke CTA Head W/WO contrast CT Code Stroke CTA Neck W/WO contrast CT Code Stroke Cerebral Perfusion with contrast 06/16/2019 IMPRESSION:   CTA neck:  1. The bilateral common and internal carotid arteries are patent within the neck without significant stenosis. Mild calcified plaque within the proximal internal carotid arteries.  2. The bilateral vertebral  arteries are patent within the neck. Mixed plaque results in mild stenosis at the origin of the left vertebral artery.   CTA head:  1. Possible occlusion of a mid to distal right M2 MCA branch vessel as described.  2. Mild atherosclerotic narrowing of the V4 right vertebral artery.  3. Atherosclerotic irregularity of the posterior cerebral arteries bilaterally.   CT perfusion head:  The perfusion software identifies a 12 mL region of critically hypoperfused parenchyma within the right parietal lobe utilizing the Tmax>6 seconds threshold. No core infarct is identified by the perfusion software. Reported mismatch volume: 12 mL.   MR BRAIN WO CONTRAST 06/16/2019 IMPRESSION:  1. 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  2. Solitary focus of petechial hemorrhage versus distal vessel thrombus, but no malignant hemorrhagic transformation or mass effect.  3. Otherwise stable chronic small vessel disease.   ECHOCARDIOGRAM COMPLETE 06/16/2019 IMPRESSIONS   1. Left ventricular ejection fraction,  by estimation, is 55 to 60%. The left ventricle has normal function. The left ventricle has no regional wall motion abnormalities. Left ventricular diastolic function could not be evaluated.   2. Right ventricular systolic function is normal. The right ventricular size is normal. There is mildly elevated pulmonary artery systolic pressure. The estimated right ventricular systolic pressure is [id number] mmHg.   3. Left atrial size was severely dilated.   4. A mitral valve annuloplasty ring is present. There is a heavily calcified focal mass-like structure present on the PMVL. This is likely deterioration of the PMVL with mitral annular calcification. There are no mobile components to it. There is no apparent destruction of the valve. There is mild to moderate MR. There is mild mitral stenosis. This is best viewed in the PLAX. When compared with the prior echo this was present but not specifically commented on. The mitral valve has been repaired/replaced. Mild to moderate mitral valve regurgitation. The mean mitral valve gradient is 6.3 mmHg with average heart rate of 80 bpm. There is a prosthetic annuloplasty ring present in the mitral position. Procedure Date: 10/23/1998.   5. The aortic valve is tricuspid. Aortic valve regurgitation is not visualized. Mild aortic valve sclerosis is present, with no evidence of aortic valve stenosis.   6. The inferior vena cava is dilated in size with <50% respiratory variability, suggesting right atrial pressure of 15 mmHg. Comparison(s): A prior study was performed on 12/01/2016. No significant change from prior study.   CT HEAD CODE STROKE WO CONTRAST 06/16/2019 IMPRESSION:  1. No evidence of acute intracranial abnormality.  2. 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  3. Stable generalized parenchymal atrophy and chronic small vessel ischemic disease.  4. Mild ethmoid sinus mucosal thickening.   ECG - atrial fibrillation -  ventricular response 83 BPM (See cardiology reading for complete details)  PHYSICAL EXAM Blood pressure 125/79, pulse 69, temperature 97.6 F (36.4 C), temperature source Oral, resp. rate 12, height 5\' 5"  (1.651 m), weight 83.9 kg, SpO2 97 %. Pleasant elderly Caucasian lady not in distress. . Afebrile. Head is nontraumatic. Neck is supple without bruit.    Cardiac exam no murmur or gallop. Lungs are clear to auscultation. Distal pulses are well felt.  Neurological Exam ;  Awake  Alert oriented x 3. Normal speech and language.eye movements full without nystagmus.fundi were not visualized. Vision acuity and fields appear normal. Hearing is normal. Palatal movements are normal. Face symmetric. Tongue midline. Normal strength, tone,  reflexes and coordination.  Mildly impaired proprioception in the left hand.  Diminished fine finger movements on the left.  Orbits left over right upper extremity.  Gait deferred.         ASSESSMENT/PLAN Ms. JOYANN TIVIS is a 86 y.o. female with history of a. Fib ( on coumadin - INR 1.90), HLD, hx of MV repair, carotid stenosis, pulmonary nodule, TIA (2006, 2011) who presented to [facility] ED with c/o problem identifying left arm. 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   MRI head - Acute infarct in the posterior Right MCA territory. Solitary focus of petechial hemorrhage versus distal vessel thrombus.  MRA head - not ordered  CTA H&N - Possible occlusion of a mid to distal right M2 MCA branch vessel. The bilateral common and internal carotid arteries are patent within the neck without significant stenosis. Mild calcified plaque within the  proximal internal carotid arteries. The bilateral vertebral arteries are patent within the neck. Mixed plaque results in mild stenosis at the origin of the left vertebral artery.   CT Perfusion - The perfusion software identifies a 12 mL region of critically hypoperfused parenchyma within the right parietal lobe utilizing the Tmax>6 seconds threshold. No core infarct is identified by the perfusion software. Reported mismatch volume: 12 mL  Carotid Doppler - CTA neck performed - carotid dopplers not indicated.  2D Echo - EF 55 - 60%. No cardiac source of emboli identified.   Sars Corona Virus 2 - negative  LDL - 169  HgbA1c - 5.8  UDS - negative  VTE prophylaxis - warfarin Diet  Diet Order            Diet Heart Room service appropriate? Yes; Fluid consistency: Thin  Diet effective now              warfarin daily prior to admission, now on aspirin 325 mg daily with warfarin daily  Patient counseled to be compliant with her antithrombotic medications  Ongoing aggressive stroke risk factor management  Therapy recommendations:  No PT f/u  Disposition:  Pending  Hypertension  Home BP meds: Lopressor ; cardizem  Current BP meds: none   Blood pressure somewhat high at times but within post stroke/TIA parameters . Permissive hypertension (OK if < 220/120) but gradually normalize in 5-7 days  . Long-term BP goal normotensive  Hyperlipidemia  Home Lipid lowering medication: none  LDL 169, goal < 70  Current lipid lowering medication: Lipitor 40 mg daily  Continue statin at discharge  Statin allergy - rash  Other Stroke Risk Factors  Advanced age  ETOH use, advised to drink no more than 1 alcoholic beverage per day.  Obesity, Body mass index is 30.79 kg/m., recommend weight loss, diet and exercise as appropriate   Hx of TIA  Atrial fibrillation  Other Active Problems  Code status - Full code  Mild thrombocytopenia - 147  TEE planned for Monday to  evaluate mitral valve mass.   Hospital day # 3  She presented with right MCA branch infarct secondary to atrial fibrillation with suboptimal anticoagulation on warfarin.  I recommend switching warfarin to Eliquis for secondary stroke prevention.  Discussed with patient  as well as I spoke to Dr. Caryl Comes electrophysiologist who is also in agreement with the plan.  Continue aggressive risk factor modification.  Discussed with Dr. Karleen Hampshire.  Greater than 50% time during this 25-minute visit was spent on counseling and coordination of care and answering questions about her embolic stroke and atrial fibrillation and stroke prevention.  Follow-up as an outpatient stroke clinic in 6 weeks with my nurse practitioner Janett Billow. Antony Contras, MD To contact Stroke Continuity provider, please refer to [email or web address] After hours, contact General Neurology

## 2019-06-19 NOTE — Progress Notes (Signed)
  Echocardiogram 2D Echocardiogram has been performed.  Jennette Dubin 06/19/2019, 12:12 PM

## 2019-06-19 NOTE — CV Procedure (Signed)
TRANSESOPHAGEAL ECHOCARDIOGRAM (TEE) NOTE  INDICATIONS: stroke, afib, prior mitral valve repair  PROCEDURE:   Informed consent was obtained prior to the procedure. The risks, benefits and alternatives for the procedure were discussed and the patient comprehended these risks.  Risks include, but are not limited to, cough, sore throat, vomiting, nausea, somnolence, esophageal and stomach trauma or perforation, bleeding, low blood pressure, aspiration, pneumonia, infection, trauma to the teeth and death.    After a procedural time-out, the patient was given propofol for sedation by anesthesia.  The patient's heart rate, blood pressure, and oxygen saturation are monitored continuously during the procedure. The transesophageal probe was inserted in the esophagus and stomach without difficulty and multiple views were obtained.  The patient was kept under observation until the patient left the procedure room.  The patient left the procedure room in stable condition.   Agitated microbubble saline contrast was administered.  COMPLICATIONS:    There were no immediate complications.  Findings:  1. LEFT VENTRICLE: The left ventricular wall thickness is mildly increased.  The left ventricular cavity is normal in size. Wall motion is normal.  LVEF is 55-60%.  2. RIGHT VENTRICLE:  The right ventricle is normal in structure and function without any thrombus or masses.    3. LEFT ATRIUM:  The left atrium is severely dilated in size without any thrombus or masses.  There is spontaneous echo contrast ("smoke") in the left atrium consistent with a low flow state.  4. LEFT ATRIAL APPENDAGE:  The left atrial appendage is free of any thrombus or masses, however, heavy smoke is noted, possibly suggesting recent thrombus. The appendage has single lobes. Pulse doppler indicates low flow in the appendage.  5. ATRIAL SEPTUM:  The atrial septum appears intact and is free of thrombus and/or masses.  There is no  evidence for interatrial shunting by color doppler and saline microbubble. Late contrast bubbles were seen in the LA, suggesting possible intrahepatic or intrapulmonary shunting.  6. RIGHT ATRIUM:  The right atrium is normal in size and function without any thrombus or masses.  7. MITRAL VALVE:  The mitral valve has been previously repaired with an annuloplasty ring (2000-[facility]). There is mild to moderate eccentric regurgitation and mild stenosis. The posterior leaflet is calcified with poor excursion - this is visualized by 2D/3D echo. No vegetation.  8. AORTIC VALVE:  The aortic valve is trileaflet, normal in structure and function with no regurgitation.  There were no vegetations or stenosis  9. TRICUSPID VALVE:  The tricuspid valve is normal in structure and function with Mild regurgitation.  There were no vegetations or stenosis  10.  PULMONIC VALVE:  The pulmonic valve is normal in structure and function with no regurgitation.  There were no vegetations or stenosis.   11. AORTIC ARCH, ASCENDING AND DESCENDING AORTA:  There was grade 1 Ron Parker et. Al, 1992) atherosclerosis of the ascending aorta, aortic arch, or proximal descending aorta.  12. PULMONARY VEINS: Anomalous pulmonary venous return was not noted.  13. PERICARDIUM: The pericardium appeared normal and non-thickened.  There is no pericardial effusion.  IMPRESSION:   1. No LAA thrombus, however, heavy smoke noted 2. Negative bubble for PFO, however, late microbubble shunting was noted 3. Severe LAE with smoke 4. S/p MV annuloplasty repair with mild MS and mild to moderate MR, calcified posterior leaflet with poor excursion 5. Mild TR 6. LVEF 50-55%, normal wall motion  RECOMMENDATIONS:    1. Suspect recent LAA thrombus as cause of stroke  given heavy smoke in the setting of mitral valve repair/MS, recent subtherapeutic INR and afib.  Time Spent Directly with the Patient:  45 minutes   Pixie Casino, MD,  [facility], [place] Director of the Advanced Lipid Disorders &  Cardiovascular Risk Reduction Clinic Diplomate of the American Board of Clinical Lipidology Attending Cardiologist  Direct Dial: [phone]  Fax: [phone]  Website:  [email or web address] [email or web address] Romina Divirgilio 06/19/2019, 12:07 PM

## 2019-06-19 NOTE — Care Management Important Message (Signed)
Important Message  Patient Details  Name: [name] MRN: [id number] Date of Birth: 1932/07/18   Medicare Important Message Given:  Yes     Orbie Pyo 06/19/2019, 3:43 PM

## 2019-06-19 NOTE — TOC Transition Note (Signed)
Transition of Care [facility]) - CM/SW Discharge Note   Patient Details  Name: [name] MRN: [id number] Date of Birth: September 29, 1932  Transition of Care [facility]) CM/SW Contact:  Pollie Friar, RN Phone Number: 06/19/2019, 11:13 AM   Clinical Narrative:    Pt lives with her son. She denies any DME at home and no issues with her home medications or with transportation.  Pt agreeable to outpatient rehab at [facility]. Orders in Epic and information on the AVS. Pt has transportation home when medically ready.    Final next level of care: OP Rehab Barriers to Discharge: No Barriers Identified   Patient Goals and CMS Choice     Choice offered to / list presented to : Patient  Discharge Placement                       Discharge Plan and Services                                     Social Determinants of Health (SDOH) Interventions     Readmission Risk Interventions No flowsheet data found.

## 2019-06-19 NOTE — Anesthesia Preprocedure Evaluation (Signed)
Anesthesia Evaluation  Patient identified by MRN, date of birth, ID band Patient awake    Reviewed: Allergy & Precautions, H&P , NPO status , Patient's Chart, lab work & pertinent test results  Airway Mallampati: II  TM Distance: >3 FB Neck ROM: Full    Dental no notable dental hx. (+) Teeth Intact, Dental Advisory Given   Pulmonary asthma ,    Pulmonary exam normal breath sounds clear to auscultation       Cardiovascular + dysrhythmias Atrial Fibrillation + Valvular Problems/Murmurs MR  Rhythm:Irregular Rate:Normal     Neuro/Psych Anxiety Depression CVA, Residual Symptoms    GI/Hepatic negative GI ROS, Neg liver ROS,   Endo/Other  Hypothyroidism   Renal/GU negative Renal ROS  negative genitourinary   Musculoskeletal  (+) Arthritis , Osteoarthritis,    Abdominal   Peds  Hematology  (+) Blood dyscrasia, anemia ,   Anesthesia Other Findings   Reproductive/Obstetrics negative OB ROS                             Anesthesia Physical Anesthesia Plan  ASA: III  Anesthesia Plan: MAC   Post-op Pain Management:    Induction: Intravenous  PONV Risk Score and Plan: 2 and Propofol infusion and Treatment may vary due to age or medical condition  Airway Management Planned: Nasal Cannula  Additional Equipment:   Intra-op Plan:   Post-operative Plan:   Informed Consent: I have reviewed the patients History and Physical, chart, labs and discussed the procedure including the risks, benefits and alternatives for the proposed anesthesia with the patient or authorized representative who has indicated his/her understanding and acceptance.     Dental advisory given  Plan Discussed with: CRNA  Anesthesia Plan Comments:         Anesthesia Quick Evaluation

## 2019-06-20 ENCOUNTER — Telehealth: Payer: Self-pay

## 2019-06-20 MED ORDER — EZETIMIBE 10 MG PO TABS: 10.0000 mg | ORAL_TABLET | Freq: Every day | ORAL | 1 refills | Status: DC

## 2019-06-20 MED ORDER — ASPIRIN 81 MG PO TBEC: 81.0000 mg | DELAYED_RELEASE_TABLET | Freq: Every day | ORAL | 1 refills | Status: DC

## 2019-06-20 MED ORDER — APIXABAN 5 MG PO TABS: 5.0000 mg | ORAL_TABLET | Freq: Two times a day (BID) | ORAL | 1 refills | Status: DC

## 2019-06-20 NOTE — Telephone Encounter (Signed)
Transition Care Management Follow-up Telephone Call  Date of discharge and from where: [facility]  How have you been since you were released from the hospital? Patient states she is doing okay just a little fatigue.   Any questions or concerns? No   Items Reviewed:  Did the pt receive and understand the discharge instructions provided? Yes   Medications obtained and verified? Yes   Any new allergies since your discharge? No   Dietary orders reviewed? Yes  Do you have support at home? Yes   Functional Questionnaire: (I = Independent and D = Dependent) ADLs: I  Bathing/Dressing- I  Meal Prep- I  Eating- I  Maintaining continence- I  Transferring/Ambulation- I  Managing Meds- I  Follow up appointments reviewed:   PCP Hospital f/u appt confirmed? No  Patient states that she just got home from the hospital and needs to decide whether to follow up with Dr. Caryl Comes or Dr. Glori Bickers after her hospitalization. She states that she will make a decision by next week and will call us back if she decides to see Dr. Glori Bickers instead.   [facility] f/u appt confirmed? see above   Are transportation arrangements needed? No   If their condition worsens, is the pt aware to call PCP or go to the Emergency Dept.? Yes  Was the patient provided with contact information for the PCP's office or ED? Yes  Was to pt encouraged to call back with questions or concerns? Yes

## 2019-06-20 NOTE — Progress Notes (Signed)
PT Cancellation Note  Patient Details Name: [name] MRN: [id number] DOB: 05-Mar-1933   Cancelled Treatment:    Reason Eval/Treat Not Completed: Other (comment)(Upon PT arrival, RN and pt informed PT that pt was preparing to d/c in the next 5-10 min. Per pt, all arrangements were made and all education was completed. The pt has no further questions at this time and denies need for PT this close to d/c.)   Karma Ganja, PT, DPT   Acute Rehabilitation Department Pager #: [phone]   Otho Bellows 06/20/2019, 10:51 AM

## 2019-06-20 NOTE — TOC Transition Note (Signed)
Transition of Care [facility]) - CM/SW Discharge Note   Patient Details  Name: [name] MRN: [id number] Date of Birth: 09-01-1932  Transition of Care [facility]) CM/SW Contact:  Pollie Friar, RN Phone Number: 06/20/2019, 10:40 AM   Clinical Narrative:    Pt discharging home today with outpatient therapy. Information on the AVS. Pt provided 30 day free card for Eliquis. Pt to f/u with pharmacy on the cost after the first 30 days.  Pt has transportation home.   Final next level of care: OP Rehab Barriers to Discharge: No Barriers Identified   Patient Goals and CMS Choice     Choice offered to / list presented to : Patient  Discharge Placement                       Discharge Plan and Services                                     Social Determinants of Health (SDOH) Interventions     Readmission Risk Interventions No flowsheet data found.

## 2019-06-20 NOTE — Discharge Summary (Signed)
Physician Discharge Summary  [name] [id number] DOB: Apr 16, 1932 DOA: 06/16/2019  PCP: Abner Greenspan, MD  Admit date: 06/16/2019 Discharge date: 06/20/2019  Admitted From: Home.  Disposition:  Home.   Recommendations for Outpatient Follow-up:  1. Follow up with PCP in 1-2 weeks 2. Please obtain BMP/CBC in one week 3. Please follow up with cardiology as recommended.     Discharge Condition:stable.  CODE STATUS: full code.  Diet recommendation: Heart Healthy    Brief/Interim Summary: 86 year old lady with prior history of mitral valve repair, permanent atrial fibrillation on anticoagulation, chronic diastolic heart failure, carotid stenosis, hypothyroidism, hyperlipidemia, TIA presents with tingling face and left upper extremity.  Code stroke was called but patient was out of window for TPA.  She was admitted for evaluation of a stroke.  Initial CT of the head no any acute intracranial abnormality. MRI of the brain shows Acute infarct in the posterior Right MCA territory.  CT angiogram of the head and neck showed possible occlusion of mid to distal right M2 MCA branch vessel.  Bilateral common and internal carotid arteries are patent without significant stenosis.  Bilateral vertebral arteries are patent within the neck.  Mixed plaque results in mild stenosis at the origin of the left vertebral artery.  CT perfusion study identifies a 12 mm region of critically hypoperfused parenchyma within the right parietal lobe no core infarct is identified.  Echocardiogram does not show any cardiac source of emboli.  A TEE was performed did not show any thrombus but evidence of left atrial smoke present along with calcification of mitral valve.  Neurology and cardiology recommends to switch warfarin to Eliquis.     Discharge Diagnoses:  Principal Problem:   CVA (cerebral vascular accident) [facility]) Active Problems:   Hypothyroidism   Hyperlipidemia   Atrial fibrillation ([place])   (HFpEF) heart  failure with preserved ejection fraction ([place])  Acute Infarct in the posterior right MCA territory Neurology consulted.   MRI brain shows acute infarct in the posterior MCA territory.  Echocardiogram showed LV EF of 55%, no regional wall abnormalities. A mitral valve annuloplasty ring is present. There is a heavily calcified focal mass-like structure present on the PMVL. This is likely deterioration of the PMVL with mitral annular calcification. There are no mobile components to it.   CT angiogram of the head and neck showed possible occlusion of mid to distal right M2 MCA branch vessel.  Bilateral common and internal carotid arteries are patent without significant stenosis.  Bilateral vertebral arteries are patent within the neck.  Mixed plaque results in mild stenosis at the origin of the left vertebral artery.    CT perfusion study identifies a 12 mm region of critically hypoperfused parenchyma within the right parietal lobe no core infarct is identified.   Echocardiogram does not show any cardiac source of emboli.  A TEE was performed did not show any thrombus but evidence of left atrial smoke present along with calcification of mitral valve.   Neurology and cardiology recommends to switch warfarin to Eliquis.   Therapy evaluations recommending no PT follow up  Hemoglobin A1c at 5.8 LDL is about 169.  Zetia added by cardiology.    Chronic diastolic heart failure Echocardiogram this admission could not assess diastolic dysfunction.  She appears to be compensated.    Chronic atrial fibrillation  S/p ablation and cardioversion twice as per the patient.  Patient has been off and on Cardizem and has been intolerant to it.   Cardiology started the  patient on metoprolol 50 mg BID.  Plan for AV nodal ablation and pacemaker placement to be scheduled as an outpatient. Pt would like to     Essential hypertension Well controlled.    Hypothyroidism Continue with Synthroid. TSH  within normal limits  S/p mitral valve repair. Echocardiogram shows there is a heavily calcified focal mass-like structure present on the PMVL. This is likely deterioration of the PMVL with mitral annular calcification. TEE shows smoke in the left arium,no thrombus. Coumadin switched to Eliquis.  Patient would like to  Follow up with Dr. Caryl Comes  Discharge Instructions  Discharge Instructions    Ambulatory referral to Occupational Therapy   Complete by: As directed    Diet - low sodium heart healthy   Complete by: As directed    Discharge instructions   Complete by: As directed    Please follow up with Neurology and cardiology as recommended.     Allergies as of 06/20/2019      Reactions   Amiodarone Hcl Swelling   SWELLING REACTION UNSPECIFIED    Penicillins Rash   Has patient had a PCN reaction causing immediate rash, facial/tongue/throat swelling, SOB or lightheadedness with hypotension: No Has patient had a PCN reaction causing severe rash involving mucus membranes or skin necrosis: No Has patient had a PCN reaction that required hospitalization:Patient was inpatient when reaction occurred Has patient had a PCN reaction occurring within the last 10 years: No If all of the above answers are "NO", then may proceed with Cephalosporin use.   Statins Rash      Medication List    STOP taking these medications   clindamycin 150 MG capsule Commonly known as: CLEOCIN   diltiazem 120 MG 24 hr capsule Commonly known as: CARDIZEM CD   warfarin 5 MG tablet Commonly known as: COUMADIN     TAKE these medications   albuterol 108 (90 Base) MCG/ACT inhaler Commonly known as: VENTOLIN HFA Inhale 2 puffs into the lungs every 4 (four) hours as needed for wheezing or shortness of breath.   alendronate 70 MG tablet Commonly known as: FOSAMAX Take 1 tablet (70 mg total) by mouth every 7 (seven) days. Take with a full glass of water on an empty stomach.   apixaban 5 MG Tabs  tablet Commonly known as: ELIQUIS Take 1 tablet (5 mg total) by mouth 2 (two) times daily.   aspirin 81 MG EC tablet Take 1 tablet (81 mg total) by mouth daily.   diphenhydrAMINE 25 mg capsule Commonly known as: BENADRYL Take 50 mg by mouth every 6 (six) hours as needed for itching.   ezetimibe 10 MG tablet Commonly known as: ZETIA Take 1 tablet (10 mg total) by mouth daily.   ferrous sulfate 325 (65 FE) MG EC tablet Take 1 tablet (325 mg total) by mouth daily with breakfast.   fluticasone 50 MCG/ACT nasal spray Commonly known as: FLONASE Place 1 spray into both nostrils daily as needed for allergies.   levothyroxine 25 MCG tablet Commonly known as: SYNTHROID Take 1 tablet (25 mcg total) by mouth daily before breakfast.   loratadine 10 MG tablet Commonly known as: CLARITIN Take 10 mg by mouth daily as needed for allergies.   Lubricant Eye Drops 0.4-0.3 % Soln Generic drug: Polyethyl Glycol-Propyl Glycol Place 1-2 drops into both eyes 3 (three) times daily as needed (for dry eyes.).   metoprolol tartrate 50 MG tablet Commonly known as: LOPRESSOR Take 1 tablet (50 mg total) by mouth 2 (two) times daily.  What changed: how much to take      Follow-up Information    [facility].   Specialty: Rehabilitation Why: The outpatient therapy will contact you for the first appointment Contact information: [place] [address] [phone]         Allergies  Allergen Reactions  . Amiodarone Hcl Swelling    SWELLING REACTION UNSPECIFIED   . Penicillins Rash    Has patient had a PCN reaction causing immediate rash, facial/tongue/throat swelling, SOB or lightheadedness with hypotension: No Has patient had a PCN reaction causing severe rash involving mucus membranes or skin necrosis: No Has patient had a PCN reaction that required hospitalization:Patient was inpatient when  reaction occurred Has patient had a PCN reaction occurring within the last 10 years: No If all of the above answers are "NO", then may proceed with Cephalosporin use.   . Statins Rash    Consultations:  Cardiology.    Procedures/Studies: CT Code Stroke CTA Head W/WO contrast  Result Date: 06/16/2019 CLINICAL DATA:  Focal neuro deficit, greater than 6 hours, stroke suspected. EXAM: CT ANGIOGRAPHY HEAD AND NECK CT PERFUSION BRAIN TECHNIQUE: Multidetector CT imaging of the head and neck was performed using the standard protocol during bolus administration of intravenous contrast. Multiplanar CT image reconstructions and MIPs were obtained to evaluate the vascular anatomy. Carotid stenosis measurements (when applicable) are obtained utilizing NASCET criteria, using the distal internal carotid diameter as the denominator. Multiphase CT imaging of the brain was performed following IV bolus contrast injection. Subsequent parametric perfusion maps were calculated using RAPID software. CONTRAST:  Administered contrast not known at this time. COMPARISON:  Concurrently performed noncontrast head CT 06/16/2019, CTA head/neck 06/13/2009, MRA neck 03/29/2006 FINDINGS: CTA NECK FINDINGS Aortic arch: Standard aortic branching. Atherosclerotic plaque within the visualized aortic arch and proximal major branch vessels of the neck. No significant innominate or proximal subclavian artery stenosis. Right carotid system: CCA and ICA patent within the neck without stenosis. Mild calcified plaque within the proximal ICA. Redemonstrated tortuosity of the ICA. Left carotid system: CCA and ICA patent within the neck without stenosis. Mild calcified plaque within the proximal ICA. Vertebral arteries: The vertebral arteries are codominant and patent within the neck. Mixed plaque at the origin of the left vertebral artery results in mild ostial stenosis. Skeleton: No acute bony abnormality or aggressive osseous lesion. C5-C6  spondylosis with moderate disc height loss, posterior disc osteophyte and uncovertebral hypertrophy. Other neck: No neck mass or cervical lymphadenopathy. Upper chest: No consolidation within the imaged lung apices. Mild interlobular septal thickening within the visualized lung apices is nonspecific, but may reflect mild edema. Prior median sternotomy. Redemonstrated calcified mediastinal lymph nodes. Review of the MIP images confirms the above findings CTA HEAD FINDINGS Anterior circulation: The intracranial internal carotid arteries are patent without significant stenosis. The M1 middle cerebral arteries are patent without significant stenosis. Question occlusion of a mid to distal right M2 MCA branch vessel (see annotations on series 9, images 67 through 71) (series 12, image 15). The anterior cerebral arteries are patent without significant proximal stenosis. No intracranial aneurysm is identified. Posterior circulation: The intracranial vertebral arteries are patent. Mild atherosclerotic narrowing of the V4 right vertebral artery beyond the origin of the right PICA. The basilar artery is patent without significant stenosis. The posterior cerebral arteries are patent without significant proximal stenosis. Atherosclerotic irregularity of the distal right posterior cerebral arteries bilaterally. There is the left P1 segment is  hypoplastic. There is a sizable left posterior communicating artery. No definite right posterior communicating artery is identified. Venous sinuses: Within limitations of contrast timing, no convincing thrombus. Anatomic variants: As described Review of the MIP images confirms the above findings CT Brain Perfusion Findings: ASPECTS: 10 CBF (<30%) Volume: 71mL Perfusion (Tmax>6.0s) volume: 3mL (within the right parietal lobe) Mismatch Volume: 32mL Infarction Location:None identified These results were called by telephone at the time of interpretation on 06/16/2019 at 9:52 am to provider Dr.  Rory Percy, who verbally acknowledged these results. IMPRESSION: CTA neck: 1. The bilateral common and internal carotid arteries are patent within the neck without significant stenosis. Mild calcified plaque within the proximal internal carotid arteries. 2. The bilateral vertebral arteries are patent within the neck. Mixed plaque results in mild stenosis at the origin of the left vertebral artery. CTA head: 1. Possible occlusion of a mid to distal right M2 MCA branch vessel as described. 2. Mild atherosclerotic narrowing of the V4 right vertebral artery. 3. Atherosclerotic irregularity of the posterior cerebral arteries bilaterally. CT perfusion head: The perfusion software identifies a 12 mL region of critically hypoperfused parenchyma within the right parietal lobe utilizing the Tmax>6 seconds threshold. No core infarct is identified by the perfusion software. Reported mismatch volume: 12 mL. Electronically Signed   By: Kellie Simmering DO   On: 06/16/2019 10:10   CT Code Stroke CTA Neck W/WO contrast  Result Date: 06/16/2019 CLINICAL DATA:  Focal neuro deficit, greater than 6 hours, stroke suspected. EXAM: CT ANGIOGRAPHY HEAD AND NECK CT PERFUSION BRAIN TECHNIQUE: Multidetector CT imaging of the head and neck was performed using the standard protocol during bolus administration of intravenous contrast. Multiplanar CT image reconstructions and MIPs were obtained to evaluate the vascular anatomy. Carotid stenosis measurements (when applicable) are obtained utilizing NASCET criteria, using the distal internal carotid diameter as the denominator. Multiphase CT imaging of the brain was performed following IV bolus contrast injection. Subsequent parametric perfusion maps were calculated using RAPID software. CONTRAST:  Administered contrast not known at this time. COMPARISON:  Concurrently performed noncontrast head CT 06/16/2019, CTA head/neck 06/13/2009, MRA neck 03/29/2006 FINDINGS: CTA NECK FINDINGS Aortic arch:  Standard aortic branching. Atherosclerotic plaque within the visualized aortic arch and proximal major branch vessels of the neck. No significant innominate or proximal subclavian artery stenosis. Right carotid system: CCA and ICA patent within the neck without stenosis. Mild calcified plaque within the proximal ICA. Redemonstrated tortuosity of the ICA. Left carotid system: CCA and ICA patent within the neck without stenosis. Mild calcified plaque within the proximal ICA. Vertebral arteries: The vertebral arteries are codominant and patent within the neck. Mixed plaque at the origin of the left vertebral artery results in mild ostial stenosis. Skeleton: No acute bony abnormality or aggressive osseous lesion. C5-C6 spondylosis with moderate disc height loss, posterior disc osteophyte and uncovertebral hypertrophy. Other neck: No neck mass or cervical lymphadenopathy. Upper chest: No consolidation within the imaged lung apices. Mild interlobular septal thickening within the visualized lung apices is nonspecific, but may reflect mild edema. Prior median sternotomy. Redemonstrated calcified mediastinal lymph nodes. Review of the MIP images confirms the above findings CTA HEAD FINDINGS Anterior circulation: The intracranial internal carotid arteries are patent without significant stenosis. The M1 middle cerebral arteries are patent without significant stenosis. Question occlusion of a mid to distal right M2 MCA branch vessel (see annotations on series 9, images 67 through 71) (series 12, image 15). The anterior cerebral arteries are patent without significant proximal stenosis. No  intracranial aneurysm is identified. Posterior circulation: The intracranial vertebral arteries are patent. Mild atherosclerotic narrowing of the V4 right vertebral artery beyond the origin of the right PICA. The basilar artery is patent without significant stenosis. The posterior cerebral arteries are patent without significant proximal  stenosis. Atherosclerotic irregularity of the distal right posterior cerebral arteries bilaterally. There is the left P1 segment is hypoplastic. There is a sizable left posterior communicating artery. No definite right posterior communicating artery is identified. Venous sinuses: Within limitations of contrast timing, no convincing thrombus. Anatomic variants: As described Review of the MIP images confirms the above findings CT Brain Perfusion Findings: ASPECTS: 10 CBF (<30%) Volume: 43mL Perfusion (Tmax>6.0s) volume: 28mL (within the right parietal lobe) Mismatch Volume: 3mL Infarction Location:None identified These results were called by telephone at the time of interpretation on 06/16/2019 at 9:52 am to provider Dr. Rory Percy, who verbally acknowledged these results. IMPRESSION: CTA neck: 1. The bilateral common and internal carotid arteries are patent within the neck without significant stenosis. Mild calcified plaque within the proximal internal carotid arteries. 2. The bilateral vertebral arteries are patent within the neck. Mixed plaque results in mild stenosis at the origin of the left vertebral artery. CTA head: 1. Possible occlusion of a mid to distal right M2 MCA branch vessel as described. 2. Mild atherosclerotic narrowing of the V4 right vertebral artery. 3. Atherosclerotic irregularity of the posterior cerebral arteries bilaterally. CT perfusion head: The perfusion software identifies a 12 mL region of critically hypoperfused parenchyma within the right parietal lobe utilizing the Tmax>6 seconds threshold. No core infarct is identified by the perfusion software. Reported mismatch volume: 12 mL. Electronically Signed   By: Kellie Simmering DO   On: 06/16/2019 10:10   MR BRAIN WO CONTRAST  Result Date: 06/16/2019 CLINICAL DATA:  86 year old female with atrial fibrillation on warfarin. Code stroke presentation earlier today. Possible right M3 branch occlusion on CTA/CTP today. Not a candidate for tPA. EXAM:  MRI HEAD WITHOUT CONTRAST TECHNIQUE: Multiplanar, multiecho pulse sequences of the brain and surrounding structures were obtained without intravenous contrast. COMPARISON:  CTA and CTP earlier today. Brain MRI 11/21/2018 and earlier. FINDINGS: Brain: Confluent 3-4 cm area of mostly cortical restricted diffusion in the anterior right parietal lobe, post sensory and with some sensory strip involvement suspected (series 3, image 35). This nicely corresponds to the T-max abnormality on CTP earlier today. There is a focus of petechial hemorrhage, or less likely distal right MCA thrombus identified by SWI on series 8, image 65. No other associated blood products. No mass effect. No other restricted diffusion. A few scattered chronic microhemorrhages elsewhere in the brain are stable from last year, primarily in the posterior temporal and occipital lobes. Stable scattered and patchy bilateral white matter T2 and FLAIR hyperintensity. 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 No midline shift, mass effect, evidence of mass lesion, ventriculomegaly, extra-axial collection or acute intracranial hemorrhage. Cervicomedullary junction and pituitary are within normal limits. Vascular: Major intracranial vascular flow voids are stable since last year. But there is a small posterior right MCA branch visible on FLAIR today (series 7, image 13). Skull and upper cervical spine: Normal for age visible cervical spine. Hyperostosis of the calvarium. Normal bone marrow signal. Sinuses/Orbits: Stable, negative. Other: Mastoids remain clear. Visible internal auditory structures appear normal. IMPRESSION: 1. 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 2. Solitary focus of petechial hemorrhage versus distal vessel thrombus, but no malignant hemorrhagic transformation  or mass effect. 3. Otherwise stable chronic small vessel disease. Electronically Signed   By: Genevie Ann M.D.    On: 06/16/2019 16:19   CT Code Stroke Cerebral Perfusion with contrast  Result Date: 06/16/2019 CLINICAL DATA:  Focal neuro deficit, greater than 6 hours, stroke suspected. EXAM: CT ANGIOGRAPHY HEAD AND NECK CT PERFUSION BRAIN TECHNIQUE: Multidetector CT imaging of the head and neck was performed using the standard protocol during bolus administration of intravenous contrast. Multiplanar CT image reconstructions and MIPs were obtained to evaluate the vascular anatomy. Carotid stenosis measurements (when applicable) are obtained utilizing NASCET criteria, using the distal internal carotid diameter as the denominator. Multiphase CT imaging of the brain was performed following IV bolus contrast injection. Subsequent parametric perfusion maps were calculated using RAPID software. CONTRAST:  Administered contrast not known at this time. COMPARISON:  Concurrently performed noncontrast head CT 06/16/2019, CTA head/neck 06/13/2009, MRA neck 03/29/2006 FINDINGS: CTA NECK FINDINGS Aortic arch: Standard aortic branching. Atherosclerotic plaque within the visualized aortic arch and proximal major branch vessels of the neck. No significant innominate or proximal subclavian artery stenosis. Right carotid system: CCA and ICA patent within the neck without stenosis. Mild calcified plaque within the proximal ICA. Redemonstrated tortuosity of the ICA. Left carotid system: CCA and ICA patent within the neck without stenosis. Mild calcified plaque within the proximal ICA. Vertebral arteries: The vertebral arteries are codominant and patent within the neck. Mixed plaque at the origin of the left vertebral artery results in mild ostial stenosis. Skeleton: No acute bony abnormality or aggressive osseous lesion. C5-C6 spondylosis with moderate disc height loss, posterior disc osteophyte and uncovertebral hypertrophy. Other neck: No neck mass or cervical lymphadenopathy. Upper chest: No consolidation within the imaged lung apices.  Mild interlobular septal thickening within the visualized lung apices is nonspecific, but may reflect mild edema. Prior median sternotomy. Redemonstrated calcified mediastinal lymph nodes. Review of the MIP images confirms the above findings CTA HEAD FINDINGS Anterior circulation: The intracranial internal carotid arteries are patent without significant stenosis. The M1 middle cerebral arteries are patent without significant stenosis. Question occlusion of a mid to distal right M2 MCA branch vessel (see annotations on series 9, images 67 through 71) (series 12, image 15). The anterior cerebral arteries are patent without significant proximal stenosis. No intracranial aneurysm is identified. Posterior circulation: The intracranial vertebral arteries are patent. Mild atherosclerotic narrowing of the V4 right vertebral artery beyond the origin of the right PICA. The basilar artery is patent without significant stenosis. The posterior cerebral arteries are patent without significant proximal stenosis. Atherosclerotic irregularity of the distal right posterior cerebral arteries bilaterally. There is the left P1 segment is hypoplastic. There is a sizable left posterior communicating artery. No definite right posterior communicating artery is identified. Venous sinuses: Within limitations of contrast timing, no convincing thrombus. Anatomic variants: As described Review of the MIP images confirms the above findings CT Brain Perfusion Findings: ASPECTS: 10 CBF (<30%) Volume: 92mL Perfusion (Tmax>6.0s) volume: 72mL (within the right parietal lobe) Mismatch Volume: 32mL Infarction Location:None identified These results were called by telephone at the time of interpretation on 06/16/2019 at 9:52 am to provider Dr. Rory Percy, who verbally acknowledged these results. IMPRESSION: CTA neck: 1. The bilateral common and internal carotid arteries are patent within the neck without significant stenosis. Mild calcified plaque within the  proximal internal carotid arteries. 2. The bilateral vertebral arteries are patent within the neck. Mixed plaque results in mild stenosis at the origin of the left vertebral artery. CTA  head: 1. Possible occlusion of a mid to distal right M2 MCA branch vessel as described. 2. Mild atherosclerotic narrowing of the V4 right vertebral artery. 3. Atherosclerotic irregularity of the posterior cerebral arteries bilaterally. CT perfusion head: The perfusion software identifies a 12 mL region of critically hypoperfused parenchyma within the right parietal lobe utilizing the Tmax>6 seconds threshold. No core infarct is identified by the perfusion software. Reported mismatch volume: 12 mL. Electronically Signed   By: Kellie Simmering DO   On: 06/16/2019 10:10   ECHOCARDIOGRAM COMPLETE  Result Date: 06/16/2019    ECHOCARDIOGRAM REPORT   Patient Name:   [name] Date of Exam: 06/16/2019 Medical Rec #:  [id number]     Height:       65.0 in Accession #:    [id number]    Weight:       182.0 lb Date of Birth:  1932/05/23      BSA:          1.900 m Patient Age:    11 years      BP:           140/77 mmHg Patient Gender: F             HR:           83 bpm. Exam Location:  Inpatient Procedure: 2D Echo                                MODIFIED REPORT: This report was modified by Eleonore Chiquito MD on 06/16/2019 due to updated RVSP.  Indications:     Stroke I163.9  History:         Patient has prior history of Echocardiogram examinations, most                  recent 12/01/2016. Arrythmias:Atrial Fibrillation; Risk                  Factors:Dyslipidemia.                   Mitral Valve: prosthetic annuloplasty ring valve is present in                  the mitral position. Procedure Date: 10/23/1998.  Sonographer:     Mikki Santee RDCS (AE) Referring Phys:  Declo Diagnosing Phys: Eleonore Chiquito MD IMPRESSIONS  1. Left ventricular ejection fraction, by estimation, is 55 to 60%. The left ventricle has normal function. The left  ventricle has no regional wall motion abnormalities. Left ventricular diastolic function could not be evaluated.  2. Right ventricular systolic function is normal. The right ventricular size is normal. There is mildly elevated pulmonary artery systolic pressure. The estimated right ventricular systolic pressure is Q000111Q mmHg.  3. Left atrial size was severely dilated.  4. A mitral valve annuloplasty ring is present. There is a heavily calcified focal mass-like structure present on the PMVL. This is likely deterioration of the PMVL with mitral annular calcification. There are no mobile components to it. There is no apparent destruction of the valve. There is mild to moderate MR. There is mild mitral stenosis. This is best viewed in the PLAX. When compared with the prior echo this was present but not specifically commented on. The mitral valve has been repaired/replaced. Mild to moderate mitral valve regurgitation. The mean mitral valve gradient is 6.3 mmHg with average heart rate of 80 bpm. There is  a prosthetic annuloplasty ring present in the mitral position. Procedure Date: 10/23/1998.  5. The aortic valve is tricuspid. Aortic valve regurgitation is not visualized. Mild aortic valve sclerosis is present, with no evidence of aortic valve stenosis.  6. The inferior [name] cava is dilated in size with <50% respiratory variability, suggesting right atrial pressure of 15 mmHg. Comparison(s): A prior study was performed on 12/01/2016. No significant change from prior study. FINDINGS  Left Ventricle: Left ventricular ejection fraction, by estimation, is 55 to 60%. The left ventricle has normal function. The left ventricle has no regional wall motion abnormalities. The left ventricular internal cavity size was normal in size. There is  no left ventricular hypertrophy. Left ventricular diastolic function could not be evaluated due to mitral valve repair. Left ventricular diastolic function could not be evaluated. Right  Ventricle: The right ventricular size is normal. No increase in right ventricular wall thickness. Right ventricular systolic function is normal. There is mildly elevated pulmonary artery systolic pressure. The tricuspid regurgitant velocity is 2.35  m/s, and with an assumed right atrial pressure of 15 mmHg, the estimated right ventricular systolic pressure is Q000111Q mmHg. Left Atrium: Left atrial size was severely dilated. Right Atrium: Right atrial size was normal in size. Pericardium: There is no evidence of pericardial effusion. Presence of pericardial fat pad. Mitral Valve: A mitral valve annuloplasty ring is present. There is a heavily calcified focal mass-like structure present on the PMVL. This is likely deterioration of the PMVL with mitral annular calcification. There are no mobile components to it. There  is no apparent destruction of the valve. There is mild to moderate MR. There is mild mitral stenosis. This is best viewed in the PLAX. When compared with the prior echo this was present but not specifically commented on. The mitral valve has been repaired/replaced. Mild to moderate mitral valve regurgitation. There is a prosthetic annuloplasty ring present in the mitral position. Procedure Date: 10/23/1998. MV peak gradient, 16.2 mmHg. The mean mitral valve gradient is 6.3 mmHg with average heart rate of 80 bpm. Tricuspid Valve: The tricuspid valve is grossly normal. Tricuspid valve regurgitation is not demonstrated. No evidence of tricuspid stenosis. Aortic Valve: The aortic valve is tricuspid. Aortic valve regurgitation is not visualized. Mild aortic valve sclerosis is present, with no evidence of aortic valve stenosis. Pulmonic Valve: The pulmonic valve was grossly normal. Pulmonic valve regurgitation is trivial. No evidence of pulmonic stenosis. Aorta: The aortic root is normal in size and structure. Venous: The inferior [name] cava is dilated in size with less than 50% respiratory variability, suggesting  right atrial pressure of 15 mmHg. IAS/Shunts: The atrial septum is grossly normal. EKG: Rhythm strip during this exam demostrated atrial fibrillation.  LEFT VENTRICLE PLAX 2D LVIDd:         4.20 cm LVIDs:         3.10 cm LV PW:         1.00 cm LV IVS:        1.00 cm LVOT diam:     2.00 cm LV SV:         42 LV SV Index:   22 LVOT Area:     3.14 cm  RIGHT VENTRICLE RV S prime:     9.24 cm/s TAPSE (M-mode): 1.5 cm LEFT ATRIUM              Index       RIGHT ATRIUM           Index LA  diam:        4.70 cm  2.47 cm/m  RA Area:     19.60 cm LA Vol (A2C):   110.0 ml 57.88 ml/m RA Volume:   46.30 ml  24.36 ml/m LA Vol (A4C):   113.0 ml 59.46 ml/m LA Biplane Vol: 115.0 ml 60.51 ml/m  AORTIC VALVE LVOT Vmax:   70.23 cm/s LVOT Vmean:  47.567 cm/s LVOT VTI:    0.133 m  AORTA Ao Root diam: 3.00 cm MITRAL VALVE              TRICUSPID VALVE MV Area (PHT): 2.97 cm   TR Peak grad:   22.1 mmHg MV Peak grad:  16.2 mmHg  TR Vmax:        235.00 cm/s MV Mean grad:  6.3 mmHg MV Vmax:       2.01 m/s   SHUNTS MV Vmean:      112.5 cm/s Systemic VTI:  0.13 m MR Peak grad: 116.6 mmHg  Systemic Diam: 2.00 cm MR Vmax:      540.00 cm/s Eleonore Chiquito MD Electronically signed by Eleonore Chiquito MD Signature Date/Time: 06/16/2019/4:43:21 PM    Final (Updated)    ECHO TEE  Result Date: 06/19/2019    TRANSESOPHOGEAL ECHO REPORT   Patient Name:   [name] Date of Exam: 06/19/2019 Medical Rec #:  [id number]     Height:       65.0 in Accession #:    [id number]    Weight:       185.0 lb Date of Birth:  1932-07-06      BSA:          1.914 m Patient Age:    30 years      BP:           147/85 mmHg Patient Gender: F             HR:           87 bpm. Exam Location:  Inpatient Procedure: Transesophageal Echo, Color Doppler and Cardiac Doppler Indications:     Stroke  History:         Patient has prior history of Echocardiogram examinations, most                  recent 06/16/2019. Mitral Valve Disease, Arrythmias:Atrial                  Fibrillation;  Risk Factors:Dyslipidemia.                   Mitral Valve: prosthetic annuloplasty ring valve is present in                  the mitral position. Procedure Date: 10/23/1998.  Sonographer:     Mikki Santee RDCS (AE) Referring Phys:  [id number] Abigail Butts Diagnosing Phys: Lyman Bishop MD PROCEDURE: After discussion of the risks and benefits of a TEE, an informed consent was obtained from the patient. The transesophogeal probe was passed without difficulty through the esophogus of the patient. Imaged were obtained with the patient in a supine position. Sedation performed by different physician. The patient was monitored while under deep sedation. Anesthestetic sedation was provided intravenously by Anesthesiology: 239.75mg  of Propofol. Image quality was excellent. The patient's vital signs; including heart rate, blood pressure, and oxygen saturation; remained stable throughout the procedure. The patient developed no complications during the procedure. IMPRESSIONS  1. Left ventricular ejection fraction, by estimation, is 55 to  60%. The left ventricle has normal function. The left ventricle has no regional wall motion abnormalities. There is mild left ventricular hypertrophy.  2. Right ventricular systolic function is normal. The right ventricular size is normal.  3. Left atrial size was severely dilated. No left atrial/left atrial appendage thrombus was detected.  4. The mitral valve has been repaired/replaced. Mild to moderate mitral valve regurgitation. Mild mitral stenosis. There is a prosthetic annuloplasty ring present in the mitral position. Procedure Date: 10/23/1998.  5. The tricuspid valve is abnormal. Tricuspid valve regurgitation is moderate.  6. The aortic valve is tricuspid. Aortic valve regurgitation is not visualized.  7. Late microbubble contrast suggestive of intrahepatic or intrapulmonary shunt. FINDINGS  Left Ventricle: Left ventricular ejection fraction, by estimation, is 55 to 60%. The left  ventricle has normal function. The left ventricle has no regional wall motion abnormalities. The left ventricular internal cavity size was normal in size. There is  mild left ventricular hypertrophy. Right Ventricle: The right ventricular size is normal. No increase in right ventricular wall thickness. Right ventricular systolic function is normal. Left Atrium: Swirling and low velocity flow noted. Left atrial size was severely dilated. Spontaneous echo contrast was present in the left atrium and left atrial appendage. No left atrial/left atrial appendage thrombus was detected. Right Atrium: Right atrial size was normal in size. Pericardium: There is no evidence of pericardial effusion. Mitral Valve: The mitral valve has been repaired/replaced. There is moderate calcification of the posterior mitral valve leaflet(s). Moderately decreased mobility of the mitral valve leaflets. Mild to moderate mitral valve regurgitation. There is a prosthetic annuloplasty ring present in the mitral position. Procedure Date: 10/23/1998. Mild mitral valve stenosis. Tricuspid Valve: The tricuspid valve is abnormal. Tricuspid valve regurgitation is moderate. Aortic Valve: The aortic valve is tricuspid. Aortic valve regurgitation is not visualized. Pulmonic Valve: The pulmonic valve was grossly normal. Pulmonic valve regurgitation is trivial. Aorta: The aortic root and ascending aorta are structurally normal, with no evidence of dilitation. There is minimal (Grade I) plaque. Venous: The left upper pulmonary vein and left lower pulmonary vein are normal. IAS/Shunts: No atrial level shunt detected by color flow Doppler. Agitated saline contrast was given intravenously to evaluate for intracardiac shunting. Late microbubble contrast suggestive of intrahepatic or intrapulmonary shunt.  TRICUSPID VALVE TR Peak grad:   23.0 mmHg TR Vmax:        240.00 cm/s Lyman Bishop MD Electronically signed by Lyman Bishop MD Signature Date/Time:  06/19/2019/3:05:19 PM    Final    CT HEAD CODE STROKE WO CONTRAST  Result Date: 06/16/2019 CLINICAL DATA:  Code stroke. Neuro deficit, acute, stroke suspected. Additional history provided: Last known normal 5 a.m., patient awoke and felt as if her left hand did not along to her. EXAM: CT HEAD WITHOUT CONTRAST TECHNIQUE: Contiguous axial images were obtained from the base of the skull through the vertex without intravenous contrast. COMPARISON:  Brain MRI 11/21/2018, head CT 04/12/2018 FINDINGS: Brain: There is no evidence of acute intracranial hemorrhage, intracranial mass, midline shift or extra-axial fluid collection.No demarcated cortical infarction. Mild ill-defined hypoattenuation within the cerebral white matter is nonspecific, but consistent with chronic small vessel ischemic disease and stable from prior exams. Stable, mild generalized parenchymal atrophy. 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 Vascular: No hyperdense vessel.  Atherosclerotic calcifications. Skull: Normal. Negative for fracture or focal lesion. Sinuses/Orbits: Visualized orbits demonstrate no acute abnormality. Mild ethmoid sinus mucosal thickening. No significant mastoid effusion. These results were  communicated to Dr. Rory Percy At 9:37 amon 3/26/2021by text page via the [facility] messaging system. IMPRESSION: 1. No evidence of acute intracranial abnormality. 2. 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 3. Stable generalized parenchymal atrophy and chronic small vessel ischemic disease. 4. Mild ethmoid sinus mucosal thickening. Electronically Signed   By: Kellie Simmering DO   On: 06/16/2019 09:38       Subjective:  No new complaints.  Discharge Exam: Vitals:   06/19/19 2317 06/20/19 0427  BP: 120/61 (!) 144/71  Pulse: 89 98  Resp: 18 18  Temp: 98.4 F (36.9 C) 99 F (37.2 C)  SpO2: 96% 95%   Vitals:   06/19/19 1654 06/19/19 2017 06/19/19  2317 06/20/19 0427  BP: 130/77 125/60 120/61 (!) 144/71  Pulse: 81 87 89 98  Resp: 18 18 18 18   Temp: 98.2 F (36.8 C) 98.9 F (37.2 C) 98.4 F (36.9 C) 99 F (37.2 C)  TempSrc: Oral Oral Oral Oral  SpO2: 97% 96% 96% 95%  Weight:      Height:        General: Pt is alert, awake, not in acute distress Cardiovascular: RRR, S1/S2 +, no rubs, no gallops Respiratory: CTA bilaterally, no wheezing, no rhonchi Abdominal: Soft, NT, ND, bowel sounds + Extremities: no edema, no cyanosis    The results of significant diagnostics from this hospitalization (including imaging, microbiology, ancillary and laboratory) are listed below for reference.     Microbiology: Recent Results (from the past 240 hour(s))  SARS CORONAVIRUS 2 (TAT 6-24 HRS) Nasopharyngeal Nasopharyngeal Swab     Status: None   Collection Time: 06/16/19 10:40 AM   Specimen: Nasopharyngeal Swab  Result Value Ref Range Status   SARS Coronavirus 2 NEGATIVE NEGATIVE Final    Comment: (NOTE) SARS-CoV-2 target nucleic acids are NOT DETECTED. The SARS-CoV-2 RNA is generally detectable in upper and lower respiratory specimens during the acute phase of infection. Negative results do not preclude SARS-CoV-2 infection, do not rule out co-infections with other pathogens, and should not be used as the sole basis for treatment or other patient management decisions. Negative results must be combined with clinical observations, patient history, and epidemiological information. The expected result is Negative. Fact Sheet for Patients: SugarRoll.be Fact Sheet for Healthcare Providers: https://www.woods-mathews.com/ This test is not yet approved or cleared by the [place] FDA and  has been authorized for detection and/or diagnosis of SARS-CoV-2 by FDA under an Emergency Use Authorization (EUA). This EUA will remain  in effect (meaning this test can be used) for the duration of the COVID-19  declaration under Section 56 4(b)(1) of the Act, 21 U.S.C. section 360bbb-3(b)(1), unless the authorization is terminated or revoked sooner. Performed at [facility], [place] [address], [place], [place] [address]      Labs: BNP (last 3 results) No results for input(s): BNP in the last 8760 hours. Basic Metabolic Panel: Recent Labs  Lab 06/16/19 0910 06/16/19 0934 06/19/19 0452  NA 141 142 142  K 4.2 4.0 4.0  CL 106 106 106  CO2 23  --  25  GLUCOSE 104* 98 92  BUN 14 15 13   CREATININE 0.78 0.70 0.82  CALCIUM 9.0  --  8.8*   Liver Function Tests: Recent Labs  Lab 06/16/19 0910 06/19/19 0452  AST 26 15  ALT 19 14  ALKPHOS 40 37*  BILITOT 0.9 0.9  PROT 6.8 6.1*  ALBUMIN 3.5 3.1*   No results for input(s): LIPASE,  AMYLASE in the last 168 hours. No results for input(s): AMMONIA in the last 168 hours. CBC: Recent Labs  Lab 06/16/19 0910 06/16/19 0934 06/19/19 0452  WBC 4.5  --  4.7  NEUTROABS 2.8  --   --   HGB 12.4 12.2 11.2*  HCT 39.0 36.0 35.9*  MCV 89.2  --  90.0  PLT 147*  --  151   Cardiac Enzymes: No results for input(s): CKTOTAL, CKMB, CKMBINDEX, TROPONINI in the last 168 hours. BNP: Invalid input(s): POCBNP CBG: Recent Labs  Lab 06/16/19 0842  GLUCAP 94   D-Dimer No results for input(s): DDIMER in the last 72 hours. Hgb A1c No results for input(s): HGBA1C in the last 72 hours. Lipid Profile No results for input(s): CHOL, HDL, LDLCALC, TRIG, CHOLHDL, LDLDIRECT in the last 72 hours. Thyroid function studies No results for input(s): TSH, T4TOTAL, T3FREE, THYROIDAB in the last 72 hours.  Invalid input(s): FREET3 Anemia work up No results for input(s): VITAMINB12, FOLATE, FERRITIN, TIBC, IRON, RETICCTPCT in the last 72 hours. Urinalysis    Component Value Date/Time   COLORURINE STRAW (A) 06/16/2019 1245   APPEARANCEUR CLEAR 06/16/2019 1245   APPEARANCEUR Clear 02/28/2013 1204   LABSPEC 1.039 (H) 06/16/2019 1245   LABSPEC 1.006  02/28/2013 1204   PHURINE 5.0 06/16/2019 1245   GLUCOSEU NEGATIVE 06/16/2019 1245   GLUCOSEU Negative 02/28/2013 1204   HGBUR MODERATE (A) 06/16/2019 1245   BILIRUBINUR NEGATIVE 06/16/2019 1245   BILIRUBINUR Negative 02/04/2018 1703   BILIRUBINUR Negative 02/28/2013 1204   KETONESUR NEGATIVE 06/16/2019 1245   PROTEINUR NEGATIVE 06/16/2019 1245   UROBILINOGEN 0.2 02/04/2018 1703   UROBILINOGEN 0.2 02/08/2012 0324   NITRITE NEGATIVE 06/16/2019 1245   LEUKOCYTESUR NEGATIVE 06/16/2019 1245   LEUKOCYTESUR 3+ 02/28/2013 1204   Sepsis Labs Invalid input(s): PROCALCITONIN,  WBC,  LACTICIDVEN Microbiology Recent Results (from the past 240 hour(s))  SARS CORONAVIRUS 2 (TAT 6-24 HRS) Nasopharyngeal Nasopharyngeal Swab     Status: None   Collection Time: 06/16/19 10:40 AM   Specimen: Nasopharyngeal Swab  Result Value Ref Range Status   SARS Coronavirus 2 NEGATIVE NEGATIVE Final    Comment: (NOTE) SARS-CoV-2 target nucleic acids are NOT DETECTED. The SARS-CoV-2 RNA is generally detectable in upper and lower respiratory specimens during the acute phase of infection. Negative results do not preclude SARS-CoV-2 infection, do not rule out co-infections with other pathogens, and should not be used as the sole basis for treatment or other patient management decisions. Negative results must be combined with clinical observations, patient history, and epidemiological information. The expected result is Negative. Fact Sheet for Patients: SugarRoll.be Fact Sheet for Healthcare Providers: https://www.woods-mathews.com/ This test is not yet approved or cleared by the [place] FDA and  has been authorized for detection and/or diagnosis of SARS-CoV-2 by FDA under an Emergency Use Authorization (EUA). This EUA will remain  in effect (meaning this test can be used) for the duration of the COVID-19 declaration under Section 56 4(b)(1) of the Act, 21  U.S.C. section 360bbb-3(b)(1), unless the authorization is terminated or revoked sooner. Performed at [facility], [place] [address] [place], [place] [address]      Time coordinating discharge: 32 minutes  SIGNED:   Hosie Poisson, MD  [facility] 06/20/2019, 8:47 AM

## 2019-06-21 ENCOUNTER — Telehealth: Payer: Self-pay | Admitting: Oncology

## 2019-06-21 ENCOUNTER — Telehealth: Payer: Self-pay | Admitting: Internal Medicine

## 2019-06-21 NOTE — Telephone Encounter (Signed)
Patient phoned and cancelled appts for 06-26-19 as patient stated that she was recently discharged from the hospital and was not ready for appts yet at this time. Patient to phone at a later date when feeling better to reschedule.

## 2019-06-21 NOTE — Telephone Encounter (Signed)
Sister is calling requesting a nurse call her to go over the med changes made while she was in the hospital to verify Dr. Caryl Comes is aware and agrees with the changes. Please advise.

## 2019-06-21 NOTE — Telephone Encounter (Signed)
Pt calling about her recent medication changes after recent hospital adx. Wants to make sure Dr. Caryl Comes aware and has no different recommendations, "and he knows everything I am taking".   She also reports she has been weaning off Metoprolol as previously discussed with Dr. Caryl Comes.  She is down to 1/2 tablet BID, been on this dosage for past week.  She would like further instructions related to this medication. Aware I will forward to Dr. Olin Pia nurse to address when she returns to office.

## 2019-06-22 NOTE — Telephone Encounter (Signed)
Spoke with pt and reviewed medications as she requested.  Pt is asking about coverage for her Eliquis.  Recommended pt contact her insurance company re: copay and cost.  Verified pt's virtual visit scheduled with Dr Caryl Comes for 06/29/2019.  Pt verbalizes understanding and agrees with current plan.

## 2019-06-22 NOTE — Telephone Encounter (Signed)
Attempted phone call to pt.  Left voicemail message to contact RN at [phone] for further questions.

## 2019-06-22 NOTE — Telephone Encounter (Signed)
Pt has been discharged from hospital.  Spoke with pt today (See Encounter)  Reviewed pt's medication and verified virtual appointment scheduled for 06/29/2019.  Pt verbalizes understanding and agrees with current plan.

## 2019-06-22 NOTE — Telephone Encounter (Signed)
Follow up   Pt is returning call    

## 2019-06-26 ENCOUNTER — Inpatient Hospital Stay: Payer: Medicare Other

## 2019-06-26 ENCOUNTER — Inpatient Hospital Stay: Payer: Medicare Other | Admitting: Oncology

## 2019-06-26 ENCOUNTER — Telehealth: Payer: Self-pay | Admitting: Family Medicine

## 2019-06-26 NOTE — Telephone Encounter (Signed)
Will f/u with pt at the end of the week to make sure she is going to continue with the Eliquis concerning the cost of the medication.  Pt has apt with cardiology on 4/8

## 2019-06-26 NOTE — Telephone Encounter (Signed)
Pt called to cancel pt/inr appointment she stated she has stoke last week and is on different meds

## 2019-06-29 ENCOUNTER — Other Ambulatory Visit: Payer: Self-pay

## 2019-06-29 ENCOUNTER — Encounter: Payer: Medicare Other | Admitting: Internal Medicine

## 2019-06-29 ENCOUNTER — Ambulatory Visit: Payer: Medicare Other

## 2019-06-29 ENCOUNTER — Telehealth: Payer: Self-pay

## 2019-06-29 VITALS — BP 127/77 | Ht 65.0 in | Wt 184.0 lb

## 2019-06-29 DIAGNOSIS — I4819 Other persistent atrial fibrillation: Secondary | ICD-10-CM

## 2019-06-29 NOTE — Telephone Encounter (Signed)
  Patient Consent for Virtual Visit         [name] has provided verbal consent on 06/29/2019 for a virtual visit (video or telephone).   CONSENT FOR VIRTUAL VISIT FOR:  [name]  By participating in this virtual visit I agree to the following:  I hereby voluntarily request, consent and authorize [name] and its employed or contracted physicians, physician assistants, nurse practitioners or other licensed health care professionals (the Practitioner), to provide me with telemedicine health care services (the "Services") as deemed necessary by the treating Practitioner. I acknowledge and consent to receive the Services by the Practitioner via telemedicine. I understand that the telemedicine visit will involve communicating with the Practitioner through live audiovisual communication technology and the disclosure of certain medical information by electronic transmission. I acknowledge that I have been given the opportunity to request an in-person assessment or other available alternative prior to the telemedicine visit and am voluntarily participating in the telemedicine visit.  I understand that I have the right to withhold or withdraw my consent to the use of telemedicine in the course of my care at any time, without affecting my right to future care or treatment, and that the Practitioner or I may terminate the telemedicine visit at any time. I understand that I have the right to inspect all information obtained and/or recorded in the course of the telemedicine visit and may receive copies of available information for a reasonable fee.  I understand that some of the potential risks of receiving the Services via telemedicine include:  Delay or interruption in medical evaluation due to technological equipment failure or disruption; Information transmitted may not be sufficient (e.g. poor resolution of images) to allow for appropriate medical decision making by the Practitioner; and/or    In rare instances, security protocols could fail, causing a breach of personal health information.  Furthermore, I acknowledge that it is my responsibility to provide information about my medical history, conditions and care that is complete and accurate to the best of my ability. I acknowledge that Practitioner's advice, recommendations, and/or decision may be based on factors not within their control, such as incomplete or inaccurate data provided by me or distortions of diagnostic images or specimens that may result from electronic transmissions. I understand that the practice of medicine is not an exact science and that Practitioner makes no warranties or guarantees regarding treatment outcomes. I acknowledge that a copy of this consent can be made available to me via my patient portal ([facility]), or I can request a printed copy by calling the office of [place].    I understand that my insurance will be billed for this visit.   I have read or had this consent read to me. I understand the contents of this consent, which adequately explains the benefits and risks of the Services being provided via telemedicine.  I have been provided ample opportunity to ask questions regarding this consent and the Services and have had my questions answered to my satisfaction. I give my informed consent for the services to be provided through the use of telemedicine in my medical care  .

## 2019-06-29 NOTE — Progress Notes (Signed)
error 

## 2019-06-30 ENCOUNTER — Telehealth: Payer: Self-pay | Admitting: Internal Medicine

## 2019-06-30 ENCOUNTER — Ambulatory Visit: Payer: Self-pay

## 2019-06-30 NOTE — Telephone Encounter (Signed)
[name] is calling stating Dr. Caryl Comes never call her for her virtual appt after she spoke with the nurse to give her her vitals yesterday 06/29/19. She would like to know when he will be calling because she had questions she needs to ask him. Please advise.

## 2019-06-30 NOTE — Progress Notes (Signed)
Pt is now taking eliquis. Episode has been ended.

## 2019-06-30 NOTE — Telephone Encounter (Signed)
Pt reports she is still having a little problem with one hand and her speech is slow at times. Pt sounded normal with no lagging speech on the phone with this nurse.  Pt reports she is doing very well overall and is now taking eliquis. She is not sure what it costs yet because she hs not gotten a script without a coupon yet. Gave pt phone number for [employer], producers of Eliquis, to f/u concerning further assistance with cost if needed. Pt appreciative. Advised if any further assistance is needed to contact this office. Pt verbalized understanding.

## 2019-07-06 ENCOUNTER — Other Ambulatory Visit: Payer: Self-pay

## 2019-07-06 ENCOUNTER — Telehealth (INDEPENDENT_AMBULATORY_CARE_PROVIDER_SITE_OTHER): Payer: Medicare Other | Admitting: Internal Medicine

## 2019-07-06 DIAGNOSIS — I4891 Unspecified atrial fibrillation: Secondary | ICD-10-CM | POA: Diagnosis not present

## 2019-07-06 DIAGNOSIS — I639 Cerebral infarction, unspecified: Secondary | ICD-10-CM | POA: Diagnosis not present

## 2019-07-06 NOTE — Addendum Note (Signed)
Addended by: Thora Lance on: 07/06/2019 08:53 PM   Modules accepted: Orders

## 2019-07-06 NOTE — Progress Notes (Signed)
Electrophysiology TeleHealth Note   Due to national recommendations of social distancing due to COVID 19, an audio/video telehealth visit is felt to be most appropriate for this patient at this time.  See MyChart message from today for the patient's consent to telehealth for [facility].   Date:  07/06/2019   ID:  [name], DOB April 13, 1932, MRN [id number]  Location: patient's home  Provider location: [address] [place]  Evaluation Performed: Follow-up visit  PCP:  [name] Greenspan, MD  Cardiologist:     Electrophysiologist:  SK   Chief Complaint:  afib  History of Present Illness:    [name] is a 86 y.o. female who presents via audio/video conferencing for a telehealth visit today.  Since last being seen in our clinic for atrial fib with RVR  the patient reports intercurrent hospitalization for stroke while on warfarin w subtherapeutic INR, now on ASA and apixoban   Felt better off dilt.    Extremely fatigued -- needing to sleep - but also extremely limited exercise tolerance.  HR increases rapidly according to FItBit with the least effort   No chest pain  Cant live this way--no quality of life  Would like to proceed with AVablation and pacing  DATE TEST EF   10/14 Echo   55-60 %   2/17 Echo   50-55 %   9/18 Echo  55-65%   3/21 Echo 55-65% LAE-severe      Date Cr K TSH Hgb  6/18     13.9  2/19 0.74   9.8  1/20 0.83  4.06(11/19) 12.6  6/20 0.84  5.04 12.1  8/20   3.74   3/21 0.82 4.0  11.2    COVID  Vaccine.    Past Medical History:  Diagnosis Date  . Allergic rhinitis   . 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 Arthritis   . Atrial fibrillation -persistent cardiologist-  dr [name]/  primary EP -- dr [name] ([facility])   a. s/p PVI [name] 2010;  b. on tikosyn/coumadin;  c. 05/2009 Echo: EF 60-65%, Gr 2 DD. (first dx 09/ 2007)  . Bilateral lower extremity edema   . Bleeding hemorrhoid   . Carotid stenosis    mild (hosp 3/11)-  consult by vasc/ Dr Donnetta Hutching  . Complication of anesthesia    hard to wake  . Diverticulosis of colon   . Dyspnea    on exertion-climbing stairs  . Fatty liver   . H/O cardiac radiofrequency ablation    01/ 2008 at [facility] /  03/ 2010  at [facility]  . Heart failure with preserved ejection fraction ([place])   . History of adenomatous polyp of colon    tubular adenoma's  . History of cardiomyopathy    secondary tachycardia-induced cardiomyopathy -- resolved 2014  . History of squamous cell carcinoma in situ (SCCIS) of skin    05/ 2017  nasal bridge and right medial knee  . History of transient ischemic attack (TIA)    01-24-2005 and 06-12-2009  . Hyperlipidemia   . Hypothyroidism   . Mild intermittent asthma    reacts to cats  . Mixed stress and urge urinary incontinence   . Pulmonary nodule   . S/P mitral valve repair 10-23-1998  dr Boyce Medici at [facility]   for MVP and regurg. (annuloplasty ring procedure)    Past Surgical History:  Procedure Laterality Date  . APPENDECTOMY  1978  . BUBBLE STUDY  06/19/2019   Procedure: BUBBLE STUDY;  Surgeon: [name] Casino, MD;  Location: [facility] ENDOSCOPY;  Service: Cardiovascular;;  . CARDIAC ELECTROPHYSIOLOGY [place]  01/ 2008    at [place]   right-sided ablation atrial flutter  . CARDIAC ELECTROPHYSIOLOGY STUDY AND ABLATION  03/ 2010   dr [name] at [facility]   AV node ablation and pulmonary vein isolation for atrial fib  . CARDIOVERSION  06-18-2006;  07-13-2006;  10-19-2010;  10-27-2010  . COLONOSCOPY    . COLONOSCOPY WITH PROPOFOL N/A 10/13/2017   Procedure: COLONOSCOPY WITH PROPOFOL;  Surgeon: [name] Bellows, MD;  Location: [facility] ENDOSCOPY;  Service: Gastroenterology;  Laterality: N/A;  . CYSTO/ TRANSURETHRAL COLLAGEN INJECTION THERAPY  07-26-2007   dr Matilde Sprang  . DILATION AND CURETTAGE OF UTERUS    . ESOPHAGOGASTRODUODENOSCOPY (EGD) WITH PROPOFOL N/A 10/13/2017   Procedure: ESOPHAGOGASTRODUODENOSCOPY (EGD)  WITH PROPOFOL;  Surgeon: [name] Bellows, MD;  Location: [place] ENDOSCOPY;  Service: Gastroenterology;  Laterality: N/A;  . EXCISIONAL HEMORRHOIDECTOMY  1980s  . GIVENS CAPSULE STUDY N/A 12/08/2017   Procedure: GIVENS CAPSULE STUDY;  Surgeon: [name] Bellows, MD;  Location: [facility] ENDOSCOPY;  Service: Gastroenterology;  Laterality: N/A;  . HEMORRHOID SURGERY N/A 10/29/2016   Procedure: HEMORRHOIDECTOMY;  Surgeon: [name] Ruff, MD;  Location: [facility];  Service: General;  Laterality: N/A;  . MITRAL VALVE ANNULOPLASTY  10/23/1998   "Model [id number]; [employer] Lerner [id number]"; size 72mm; [facility]; Dr. Boyce Medici  . PILONIDAL CYST EXCISION  1954  . TEE WITH CARDIOVERSION  05-06-2006 at [facility];  01-02-2013 at [facility]  . TEE WITHOUT CARDIOVERSION N/A 06/19/2019   Procedure: TRANSESOPHAGEAL ECHOCARDIOGRAM (TEE);  Surgeon: [name] Casino, MD;  Location: [facility] ENDOSCOPY;  Service: Cardiovascular;  Laterality: N/A;  . TOTAL HIP ARTHROPLASTY Left 05/04/2017   Procedure: LEFT TOTAL HIP ARTHROPLASTY ANTERIOR APPROACH;  Surgeon: [name] Rossetti, MD;  Location: [place];  Service: Orthopedics;  Laterality: Left;  . TRANSTHORACIC ECHOCARDIOGRAM  05-01-2015   dr [name]   ef 50-55%/  mild AV sclerosis without stenosis/  post MV repair with mild central MR (valve area by pressure half-time 2cm^2,  valve area by continutity equation 0.91cm^2, peak grandiant 61mmHg)/  severe LAE/ mild TR/ mild RAE   . TUBAL LIGATION Bilateral 1978    Current Outpatient Medications  Medication Sig Dispense Refill  . albuterol (PROVENTIL HFA;VENTOLIN HFA) 108 (90 Base) MCG/ACT inhaler Inhale 2 puffs into the lungs every 4 (four) hours as needed for wheezing or shortness of breath. 1 Inhaler 0  . alendronate (FOSAMAX) 70 MG tablet Take 1 tablet (70 mg total) by mouth every 7 (seven) days. Take with a full glass of water on an empty stomach. 12 tablet 1  . apixaban (ELIQUIS) 5 MG TABS tablet Take 1 tablet (5 mg total) by mouth 2 (two) times  daily. 60 tablet 1  . aspirin EC 81 MG EC tablet Take 1 tablet (81 mg total) by mouth daily. 30 tablet 1  . diphenhydrAMINE (BENADRYL) 25 mg capsule Take 50 mg by mouth every 6 (six) hours as needed for itching.    . ezetimibe (ZETIA) 10 MG tablet Take 1 tablet (10 mg total) by mouth daily. 30 tablet 1  . ferrous sulfate 325 (65 FE) MG EC tablet Take 1 tablet (325 mg total) by mouth daily with breakfast. 90 tablet 3  . fluticasone (FLONASE) 50 MCG/ACT nasal spray Place 1 spray into both nostrils daily as needed for allergies.    [name] levothyroxine (SYNTHROID) 25 MCG tablet Take  1 tablet (25 mcg total) by mouth daily before breakfast. 90 tablet 3  . loratadine (CLARITIN) 10 MG tablet Take 10 mg by mouth daily as needed for allergies.     . metoprolol tartrate (LOPRESSOR) 50 MG tablet Take 1 tablet (50 mg total) by mouth 2 (two) times daily. 180 tablet 3  . Polyethyl Glycol-Propyl Glycol (LUBRICANT EYE DROPS) 0.4-0.3 % SOLN Place 1-2 drops into both eyes 3 (three) times daily as needed (for dry eyes.).     No current facility-administered medications for this visit.    Allergies:   Amiodarone hcl, Penicillins, and Statins   Social History:  The patient  reports that she has never smoked. She has never used smokeless tobacco. She reports current alcohol use. She reports that she does not use drugs.   Family History:  The patient's   family history includes Alcohol abuse in her father; Cancer in her father; Lung cancer in her father.   ROS:  Please see the history of present illness.   All other systems are personally reviewed and negative.    Exam:    Vital Signs:        Labs/Other Tests and Data Reviewed:    Recent Labs: 06/16/2019: TSH 3.817 06/19/2019: ALT 14; BUN 13; Creatinine, Ser 0.82; Hemoglobin 11.2; Platelets 151; Potassium 4.0; Sodium 142   Wt Readings from Last 3 Encounters:  06/29/19 184 lb (83.5 kg)  06/19/19 185 lb (83.9 kg)  05/26/19 194 lb (88 kg)     Other studies  personally reviewed: Additional studies/ records that were reviewed today include:As above     ASSESSMENT & PLAN:   Atrial fibrillation-permanent  Stroke L side  Hypothyroidism-treated     Dypsnea on exertion   Anemia   Alopecia with beta-blockers  Obesity   fatigue  Depression   Very despondent, discussed suffering at the end of life      Fatigue improved with stopping the dilt.  Her HR are inadequately controlled with even minimal exertion.  She has finally agreed to proceeding with AV ablation  This would obviate need for drugs and may help with exercise tolerance  Fatigue apparently can be profound following stroke; fatigue was a problem before but worsened following stroke  Spoke with Dr Wilber Bihari and will stop ASA   Discussed despondency and life not worth living if she cant feel better    COVID 19 screen The patient denies symptoms of COVID 19 at this time.  The importance of social distancing was discussed today.  Follow-up:  Following procedure    Current medicines are reviewed at length with the patient today.   The patient  concerns regarding her medicines.   The following changes were made today:    Stop asa  Labs/ tests ordered today include: **AV ablation and pacemaker scheduled 5/7* No orders of the defined types were placed in this encounter.   Future tests ( post COVID )     Patient Risk:  after full review of this patients clinical status, I feel that they are at moderate risk at this time.  Today, I have spent 13 minutes with the patient with telehealth technology discussing the above.  Signed, Virl Axe, MD  07/06/2019 4:35 PM     [place] [facility] [address] [phone] (office) [phone] (fax)

## 2019-07-07 ENCOUNTER — Ambulatory Visit: Payer: Medicare Other | Attending: Internal Medicine | Admitting: Occupational Therapy

## 2019-07-07 ENCOUNTER — Other Ambulatory Visit: Payer: Self-pay

## 2019-07-07 DIAGNOSIS — I69318 Other symptoms and signs involving cognitive functions following cerebral infarction: Secondary | ICD-10-CM | POA: Diagnosis not present

## 2019-07-07 DIAGNOSIS — R41842 Visuospatial deficit: Secondary | ICD-10-CM

## 2019-07-07 DIAGNOSIS — R414 Neurologic neglect syndrome: Secondary | ICD-10-CM

## 2019-07-07 DIAGNOSIS — R41840 Attention and concentration deficit: Secondary | ICD-10-CM | POA: Diagnosis not present

## 2019-07-07 DIAGNOSIS — R278 Other lack of coordination: Secondary | ICD-10-CM

## 2019-07-07 NOTE — Therapy (Signed)
[place] [address] [place], [place], [address] Phone: [phone]   Fax:  [phone]  Occupational Therapy Evaluation  Patient Details  Name: RONEE RISHER MRN: [id number] Date of Birth: 24-Apr-1932 Referring Provider (OT): Dr. Hosie Poisson (Dr. Glori Bickers (PCP))   Encounter Date: 07/07/2019  OT End of Session - 07/07/19 0908    Visit Number  1    Number of Visits  9    Date for OT Re-Evaluation  08/06/19   may need to modifiy due to upcoming Pacemaker surgery   Authorization Type  Medicare / [place] Time Period  07/07/19-10/05/19    Authorization - Visit Number  1    Authorization - Number of Visits  10    Progress Note Due on Visit  10    OT Start Time  0804    OT Stop Time  0848    OT Time Calculation (min)  44 min    Activity Tolerance  Patient tolerated treatment well    Behavior During Therapy  [facility] for tasks assessed/performed       Past Medical History:  Diagnosis Date  . Allergic rhinitis   . 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 Arthritis   . Atrial fibrillation -persistent cardiologist-  dr klein/  primary EP -- [facility] Tawanna Sat ([facility])   a. s/p PVI [name] 2010;  b. on tikosyn/coumadin;  c. 05/2009 Echo: EF 60-65%, Gr 2 DD. (first dx 09/ 2007)  . Bilateral lower extremity edema   . Bleeding hemorrhoid   . Carotid stenosis    mild (hosp 3/11)- consult by vasc/ Dr Donnetta Hutching  . Complication of anesthesia    hard to wake  . Diverticulosis of colon   . Dyspnea    on exertion-climbing stairs  . Fatty liver   . H/O cardiac radiofrequency ablation    01/ 2008 at [facility] /  03/ 2010  at [facility]  . Heart failure with preserved ejection fraction ([place])   . History of adenomatous polyp of colon    tubular adenoma's  . History of cardiomyopathy    secondary tachycardia-induced cardiomyopathy -- resolved 2014  . History of squamous cell carcinoma in situ (SCCIS) of skin    05/ 2017  nasal  bridge and right medial knee  . History of transient ischemic attack (TIA)    01-24-2005 and 06-12-2009  . Hyperlipidemia   . Hypothyroidism   . Mild intermittent asthma    reacts to cats  . Mixed stress and urge urinary incontinence   . Pulmonary nodule   . S/P mitral valve repair 10-23-1998  dr Boyce Medici at [facility]   for MVP and regurg. (annuloplasty ring procedure)    Past Surgical History:  Procedure Laterality Date  . APPENDECTOMY  1978  . BUBBLE STUDY  06/19/2019   Procedure: BUBBLE STUDY;  Surgeon: Pixie Casino, MD;  Location: [facility] ENDOSCOPY;  Service: Cardiovascular;;  . CARDIAC ELECTROPHYSIOLOGY [place]  01/ 2008    at [place]   right-sided ablation atrial flutter  . CARDIAC ELECTROPHYSIOLOGY STUDY AND ABLATION  03/ 2010   dr Jaymes Graff at [facility]   AV node ablation and pulmonary vein isolation for atrial fib  . CARDIOVERSION  06-18-2006;  07-13-2006;  10-19-2010;  10-27-2010  . COLONOSCOPY    . COLONOSCOPY WITH PROPOFOL N/A 10/13/2017   Procedure: COLONOSCOPY WITH PROPOFOL;  Surgeon: [name], MD;  Location: [facility] ENDOSCOPY;  Service: Gastroenterology;  Laterality: N/A;  . CYSTO/ TRANSURETHRAL COLLAGEN INJECTION THERAPY  07-26-2007   dr Matilde Sprang  . DILATION AND CURETTAGE OF UTERUS    . ESOPHAGOGASTRODUODENOSCOPY (EGD) WITH PROPOFOL N/A 10/13/2017   Procedure: ESOPHAGOGASTRODUODENOSCOPY (EGD) WITH PROPOFOL;  Surgeon: [name], MD;  Location: [facility] ENDOSCOPY;  Service: Gastroenterology;  Laterality: N/A;  . EXCISIONAL HEMORRHOIDECTOMY  1980s  . GIVENS CAPSULE STUDY N/A 12/08/2017   Procedure: GIVENS CAPSULE STUDY;  Surgeon: [name], MD;  Location: [facility] ENDOSCOPY;  Service: Gastroenterology;  Laterality: N/A;  . HEMORRHOID SURGERY N/A 10/29/2016   Procedure: HEMORRHOIDECTOMY;  Surgeon: Leighton Ruff, MD;  Location: [facility];  Service: General;  Laterality: N/A;  . MITRAL VALVE ANNULOPLASTY  10/23/1998   "Model [id number];  Campbell Lerner [id number]"; size 39mm; [facility]; Dr. Boyce Medici  . PILONIDAL CYST EXCISION  1954  . TEE WITH CARDIOVERSION  05-06-2006 at [facility];  01-02-2013 at [facility]  . TEE WITHOUT CARDIOVERSION N/A 06/19/2019   Procedure: TRANSESOPHAGEAL ECHOCARDIOGRAM (TEE);  Surgeon: Pixie Casino, MD;  Location: [facility] ENDOSCOPY;  Service: Cardiovascular;  Laterality: N/A;  . TOTAL HIP ARTHROPLASTY Left 05/04/2017   Procedure: LEFT TOTAL HIP ARTHROPLASTY ANTERIOR APPROACH;  Surgeon: Mcarthur Rossetti, MD;  Location: [place];  Service: Orthopedics;  Laterality: Left;  . TRANSTHORACIC ECHOCARDIOGRAM  05-01-2015   dr Caryl Comes   ef 50-55%/  mild AV sclerosis without stenosis/  post MV repair with mild central MR (valve area by pressure half-time 2cm^2,  valve area by continutity equation 0.91cm^2, peak grandiant 29mmHg)/  severe LAE/ mild TR/ mild RAE   . TUBAL LIGATION Bilateral 1978    There were no vitals filed for this visit.  Subjective Assessment - 07/07/19 0809    Subjective   Pt reports knocking over glasses and dropping thing    Pertinent History  CVA     (MRI showed acute infarct in posterior R MCA).  PMH:  mitral valve repair, a-fib, chronic diastolic heart failure, carotid stenosis, hypothyroidism, hyperlipidemia, TIA, HTN, pacemaker surgery scheduled 07/28/19    Limitations  pt has pacemaker surgery scheduled 07/28/19    Patient Stated Goals  be able to continue to drive safely, improved use of LUE    Currently in Pain?  No/denies        [facility] OT Assessment - 07/07/19 0001      Assessment   Medical Diagnosis  CVA    Referring Provider (OT)  Dr. Hosie Poisson   Dr. Glori Bickers (PCP)   Onset Date/Surgical Date  06/16/19    Hand Dominance  Left    Prior Therapy  none outpatient      Precautions   Precautions  None      Balance Screen   Has the patient fallen in the past 6 months  No      Home  Environment   Family/patient expects to be discharged to:  Private residence    Lives With  Alone   son comes  to assist prn     Prior Function   Level of Independence  Independent    Leisure  going out with friends, art (hasn't attempted)      ADL   Eating/Feeding  Modified independent   difficulty manipulating utensil   Grooming  Modified independent    Upper Body Bathing  Modified independent    Lower Body Bathing  Modified independent    Upper Body Dressing  --   mod I    Lower Body Dressing  Modified independent    Toilet  Transfer  Modified independent    Toileting - Clothing Manipulation  Modified independent    Toileting -  Hygiene  Modified Independent    Tub/Shower Transfer  Modified independent    ADL comments  difficulty opening packages/containers, drops/knocks over items with LUE      IADL   Shopping  Takes care of all shopping needs independently    Light Housekeeping  Performs light daily tasks such as dishwashing, bed making    Meal Prep  Plans, prepares and serves adequate meals independently    Investment banker, corporate own vehicle    Medication Management  Is responsible for taking medication in correct dosages at correct time      Mobility   Mobility Status  Independent      Written Expression   Dominant Hand  Left    Handwriting  100% legible      Vision - History   Baseline Vision  Wears contact   or glasses at all times     Vision Assessment   Ocular Range of Motion  Within Functional Limits    Tracking/Visual Pursuits  Able to track stimulus in all quads without difficulty    Saccades  Within functional limits    Convergence  Within functional limits    Visual Fields  No apparent deficits   with gross assessment   Diplopia Assessment  --   denies   Comment  Pt reports shadow in L peripheral visit intermittently.  Pt appears to demo L inattention/overall decr attention.  Tabletop visual scanning/number cancellation with 90% accuracy (most missed on L side).  Then environmental scanning with 80% accuracy initially (93% after 2nd pass), pt needed cueing  for remaining missed items (most items missed on L side, all in more visually distracting area).  Pt also moved too close to L side x1 almost hitting wall/corner of counter.      Cognition   Overall Cognitive Status  Impaired/Different from baseline    Area of Impairment  Attention;Awareness;Safety/judgement    Attention  Selective    Selective Attention  Impaired    Memory  Impaired   mild per pt report   Awareness  Impaired    Awareness Impairment  Emergent impairment    Cognition Comments  pt reports slower processing      Sensation   Light Touch  Appears Intact    Hot/Cold  --   denies change   Proprioception  Impaired by gross assessment      Coordination   Gross Motor Movements are Fluid and Coordinated  No    Fine Motor Movements are Fluid and Coordinated  No    9 Hole Peg Test  Right;Left    Right 9 Hole Peg Test  24.79    Left 9 Hole Peg Test  33.93    Tremors  Pt reports long hx of tremor, none noted today      Perception   Perception  Impaired    Inattention/Neglect  --   inattention to L visual field and LUE     ROM / Strength   AROM / PROM / Strength  AROM;Strength      AROM   Overall AROM   Within functional limits for tasks performed    Overall AROM Comments  BUEs grossly WNL   cueing  for full ROM due to decr proprioception     Strength   Overall Strength  Within functional limits for tasks performed    Overall Strength Comments  BUEs      Hand Function   Right Hand Grip (lbs)  50.2    Left Hand Grip (lbs)  53.7                      OT Education - 07/07/19 1214    Education Details  OT Eval results/POC.  Recommendation for word searches, online games for visual scanning, board/card games, limit driving as able, frequent head turns with walking/driving, stop and do visual search prior to movement to ensure she is attending to the L    Person(s) Educated  Patient    Methods  Explanation    Comprehension  Verbalized understanding           OT Long Term Goals - 07/07/19 0926      OT LONG TERM GOAL #1   Title  Pt will be independent with visual/cognitive HEP.--check LTGs 08/06/19 (may need to modify due to planned Pacemaker surgery 07/28/19)    Time  4    Period  Weeks    Status  New      OT LONG TERM GOAL #2   Title  Pt will be independent with coordination HEP.    Time  4    Period  Weeks    Status  New      OT LONG TERM GOAL #3   Title  Pt will perform environmental scanning in busy environment with at least 90% accuracy for incr safety in community.    Time  4    Period  Weeks    Status  New      OT LONG TERM GOAL #4   Title  Pt will be able to perform simple environmental scanning with divided attention with at least 85% accuracy for incr safety for driving/community activities.    Time  4    Period  Weeks    Status  New      OT LONG TERM GOAL #5   Title  Pt will improve coordination for ADLs as shown by improving time on 9-hole peg test by at least 7sec.    Baseline  33.93sec    Time  4    Period  Weeks    Status  New            Plan - 07/07/19 0910    Clinical Impression Statement  Pt is a 86 y.o. female s/p CVA (MRI showed acute infarct in posterior R MCA) 06/16/19.  Pt with PMH that includes:mitral valve repair, a-fib, chronic diastolic heart failure, carotid stenosis, hypothyroidism, hyperlipidemia, TIA, HTN.  Pt was independent and living alone prior to CVA.  Pt presents today with L inattention, mild cognitive (attention, awareness, memory) deficits, decr coordination, decr sensation, and visuospatial deficits.  Pt would benefit from occupational therapy to address these deficits for incr safety/performance of ADLs/IADLs and improved dominant LUE functional use.    OT Occupational Profile and History  Detailed Assessment- Review of Records and additional review of physical, cognitive, psychosocial history related to current functional performance    Occupational performance deficits (Please  refer to evaluation for details):  ADL's;IADL's;Leisure;Social Participation    Body Structure / Function / Physical Skills  ADL;Vision;IADL;Sensation;Coordination;FMC;UE functional use;GMC;Proprioception    Cognitive Skills  Attention;Memory;Perception;Safety Awareness    Rehab Potential  Good    Clinical Decision Making  Several treatment options, min-mod task modification necessary    Comorbidities Affecting Occupational Performance:  May have comorbidities impacting occupational performance    Modification  or Assistance to Complete Evaluation   Min-Moderate modification of tasks or assist with assess necessary to complete eval    OT Frequency  2x / week    OT Duration  4 weeks   +eval (or 8 visits over 6 weeks as schedule may be modified due to pt's upcoming Pacemaker surgery)   OT Treatment/Interventions  Self-care/ADL training;DME and/or AE instruction;Therapeutic activities;Therapeutic exercise;Cognitive remediation/compensation;Visual/perceptual remediation/compensation;Neuromuscular education;Patient/family education    Plan  visual compensation strategeis, visual and coordination HEP, tabletop/environmental scanning    Consulted and Agree with Plan of Care  Patient       Patient will benefit from skilled therapeutic intervention in order to improve the following deficits and impairments:   Body Structure / Function / Physical Skills: ADL, Vision, IADL, Sensation, Coordination, FMC, UE functional use, GMC, Proprioception Cognitive Skills: Attention, Memory, Perception, Safety Awareness     Visit Diagnosis: Visuospatial deficit  Attention and concentration deficit  Neurologic neglect syndrome  Other lack of coordination  Other symptoms and signs involving cognitive functions following cerebral infarction    Problem List Patient Active Problem List   Diagnosis Date Noted  . CVA (cerebral vascular accident) ([place]) 06/16/2019  . Facial tingling 11/29/2018  . Tremor of left  hand 11/29/2018  . Medicare annual wellness visit, subsequent 11/24/2018  . Dysuria 02/06/2018  . Iron deficiency anemia 10/21/2017  . Rapid atrial fibrillation ([place]) 08/19/2017  . Constipation 08/02/2017  . Numbness and tingling 07/14/2017  . Paresthesia 07/14/2017  . Unilateral primary osteoarthritis, left hip 05/04/2017  . Status post total replacement of left hip 05/04/2017  . Hip osteoarthritis 04/27/2017  . Venous stasis dermatitis of both lower extremities 01/08/2017  . Impacted cerumen of right ear 11/20/2016  . Osteopenia 10/25/2016  . Pedal edema 08/26/2016  . Varicose veins of both lower extremities 08/26/2016  . Estrogen deficiency 08/26/2016  . Screening mammogram, encounter for 08/26/2016  . Hemorrhoids 08/26/2016  . History of nonmelanoma skin cancer 01/01/2016  . Hip pain 08/02/2014  . Left knee pain 08/02/2014  . Chronic cough 05/08/2014  . Caregiver stress 08/16/2013  . Colon cancer screening 08/16/2013  . Encounter for therapeutic drug monitoring 04/20/2013  . Left ovarian cyst 03/14/2013  . (HFpEF) heart failure with preserved ejection fraction ([place]) 12/27/2012  . Palpitations 04/15/2012  . Cardiomyopathy, secondary --Resolved again 10/14 10/13/2010  . COLONIC POLYPS, ADENOMATOUS, HX OF 09/18/2009  . PULMONARY NODULE 12/20/2008  . GANGLION CYST 10/04/2007  . Hyperlipidemia 04/27/2007  . Depression with anxiety 04/27/2007  . Asthma, mild intermittent 04/27/2007  . INSOMNIA 04/27/2007  . ADENOMATOUS COLONIC POLYP 11/04/2006  . Hypothyroidism 09/02/2006    [facility] 07/07/2019, 12:18 PM  [place] [address] [place], [place], [address] Phone: [phone]   Fax:  [phone]  Name: ALEXARAE LEVITON MRN: [id number] Date of Birth: [birth year]   Vianne Bulls, OTR/L [facility] [address] [place], [place]  [address] [phone]  phone [phone] 07/07/19 12:18 PM

## 2019-07-10 NOTE — Addendum Note (Signed)
Addended by: Thora Lance on: 07/10/2019 10:19 AM   Modules accepted: Orders

## 2019-07-12 ENCOUNTER — Telehealth: Payer: Self-pay

## 2019-07-12 NOTE — Telephone Encounter (Signed)
Called pt to f/u on 3 months post sclerotherapy treatment. Pt feels she has seen a little fading on the back side of leg but not significant enough to her satisfaction. After looking back at her pictures taken at the time of her appt, veins area larger and would likely need a few more treatments. We discussed this and she will call back in the future if she wishes to schedule. She has "heart procedures" in the near future and does not want to schedule anything else now. Will follow PRN.

## 2019-07-18 ENCOUNTER — Other Ambulatory Visit: Payer: Self-pay | Admitting: Family Medicine

## 2019-07-19 ENCOUNTER — Telehealth: Payer: Self-pay | Admitting: Internal Medicine

## 2019-07-19 ENCOUNTER — Other Ambulatory Visit: Payer: Self-pay

## 2019-07-19 MED ORDER — APIXABAN 5 MG PO TABS: 5.0000 mg | ORAL_TABLET | Freq: Two times a day (BID) | ORAL | 1 refills | Status: DC

## 2019-07-19 NOTE — Telephone Encounter (Signed)
Pt was last seen by Dr Caryl Comes 07/06/19 telemedicine Covid-19, last labs 06/19/19 Creat 0.82, age 86, weight 83.5kg, based on specified criteria pt is on appropriate dosage of Eliquis 5mg  BID.  Will refill rx.

## 2019-07-19 NOTE — Telephone Encounter (Signed)
New message  Patient is calling in about the procedure that she has scheduled. States that she has questions about it. Please give patient a call back to assist.

## 2019-07-19 NOTE — Telephone Encounter (Signed)
Spoke with pt who states she was reading side effects of Eliquis last night as she was having some joint pain yesterday.  Pt states she is concerned with the side effects and wonders if it would be better to go back to Coumadin.  Pt advised all medications carry side effects.  It is important to take medication as prescribed.  Pt advised she must do what she feels right for her but as long as she is not having an allergic reaction or untoward side effects she should consider continuing Eliquis until after her surgery and ablation on 07/28/2019 as changing over to Coumadin could cause a delay in her procedures.  Pt reports she took a tylenol for her joint pain which did help and she has not experienced any joint pain today.   Reviewed pt instruction letter with pt (See Letter) and advised she may pick up a hard copy and surgical scrub when she comes in for labwork on 07/25/2019.  Copy of letter also sent via [place].  Pt verbalizes understanding and agrees with current plan.

## 2019-07-24 IMAGING — DX DG CHEST 1V PORT
1 series · 1 of 1 positions shown · non-contrast
Comparison: 04/09/2014

CLINICAL DATA: Stroke-like symptoms. History of heart failure and
hypothyroidism. Previous mitral valve repair.

EXAM:
PORTABLE CHEST 1 VIEW

[chest ap]
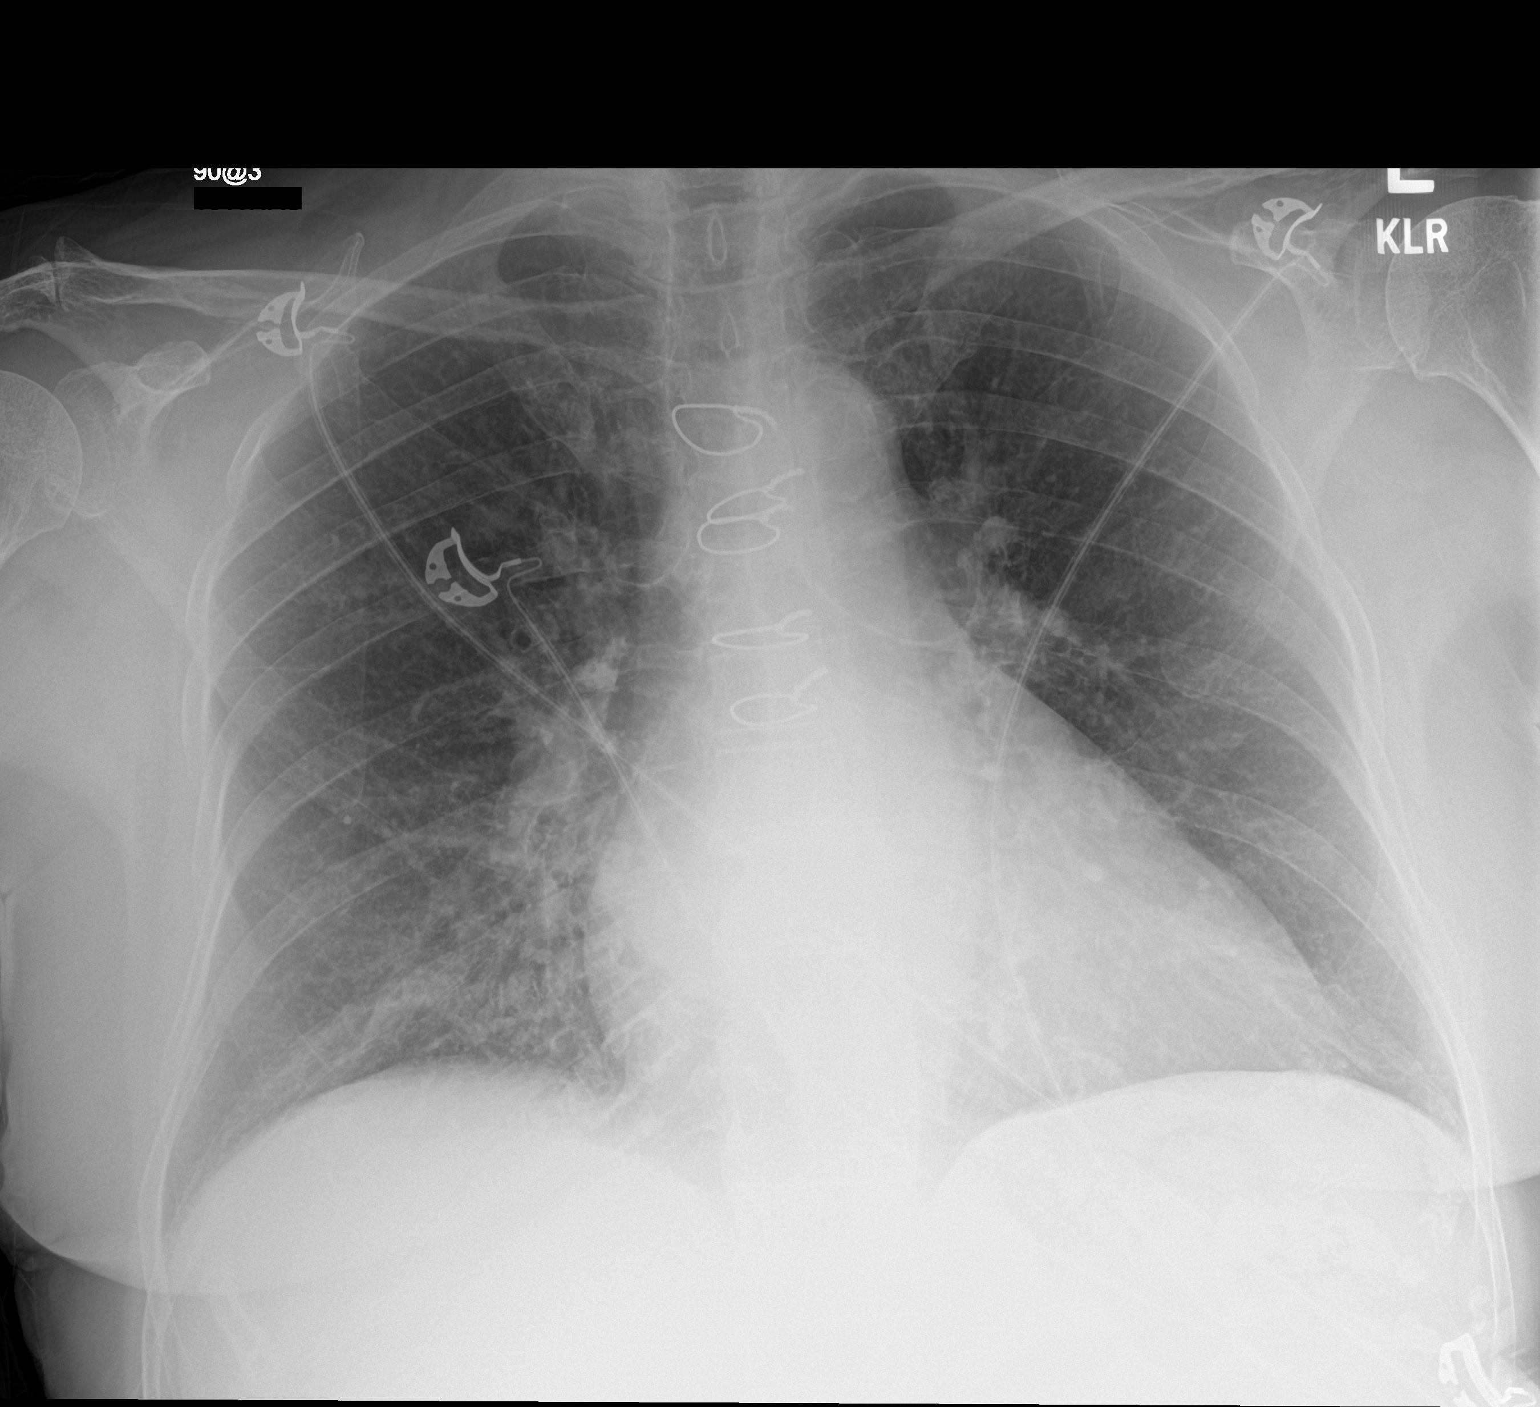

[1 of 1 positions shown; findings below may reference images not displayed]

FINDINGS: Stable changes from a previous median sternotomy. Cardiac silhouette
is top-normal in size. No mediastinal or hilar masses. No evidence
of adenopathy.

Prominent bronchovascular markings. No evidence of pneumonia or
pulmonary edema.

No pleural effusion or pneumothorax.

Skeletal structures are grossly intact.
IMPRESSION: No acute cardiopulmonary disease.

## 2019-07-25 ENCOUNTER — Other Ambulatory Visit: Payer: Medicare Other

## 2019-07-25 ENCOUNTER — Other Ambulatory Visit (HOSPITAL_COMMUNITY)
Admission: RE | Admit: 2019-07-25 | Discharge: 2019-07-25 | Disposition: A | Discharge status: Home / Self Care | Payer: Medicare Other | Source: Ambulatory Visit | Attending: Internal Medicine | Admitting: Internal Medicine

## 2019-07-25 ENCOUNTER — Other Ambulatory Visit: Payer: Self-pay

## 2019-07-25 DIAGNOSIS — I4891 Unspecified atrial fibrillation: Secondary | ICD-10-CM

## 2019-07-25 DIAGNOSIS — Z01812 Encounter for preprocedural laboratory examination: Secondary | ICD-10-CM | POA: Insufficient documentation

## 2019-07-25 DIAGNOSIS — Z20822 Contact with and (suspected) exposure to covid-19: Secondary | ICD-10-CM | POA: Insufficient documentation

## 2019-07-25 DIAGNOSIS — I639 Cerebral infarction, unspecified: Secondary | ICD-10-CM

## 2019-07-25 LAB — CBC
Hematocrit: 38.7 % (ref 34.0–46.6)
Hemoglobin: 12.8 g/dL (ref 11.1–15.9)
MCH: 27.9 pg (ref 26.6–33.0)
MCHC: 33.1 g/dL (ref 31.5–35.7)
MCV: 85 fL (ref 79–97)
Platelets: 192 10*3/uL (ref 150–450)
RBC: 4.58 x10E6/uL (ref 3.77–5.28)
RDW: 15.7 % — ABNORMAL HIGH (ref 11.7–15.4)
WBC: 5.7 10*3/uL (ref 3.4–10.8)

## 2019-07-25 LAB — BASIC METABOLIC PANEL
BUN/Creatinine Ratio: 19 (ref 12–28)
BUN: 16 mg/dL (ref 8–27)
CO2: 28 mmol/L (ref 20–29)
Calcium: 9.3 mg/dL (ref 8.7–10.3)
Chloride: 104 mmol/L (ref 96–106)
Creatinine, Ser: 0.84 mg/dL (ref 0.57–1.00)
GFR calc Af Amer: 74 mL/min/{1.73_m2} (ref >59)
GFR calc non Af Amer: 64 mL/min/{1.73_m2} (ref >59)
Glucose: 63 mg/dL — ABNORMAL LOW (ref 65–99)
Potassium: 4.6 mmol/L (ref 3.5–5.2)
Sodium: 139 mmol/L (ref 134–144)

## 2019-07-25 LAB — SARS CORONAVIRUS 2 (TAT 6-24 HRS): SARS Coronavirus 2: NEGATIVE

## 2019-07-28 ENCOUNTER — Other Ambulatory Visit: Payer: Self-pay

## 2019-07-28 ENCOUNTER — Ambulatory Visit (HOSPITAL_COMMUNITY)
Admission: RE | Disposition: A | Discharge status: Home / Self Care | Payer: Self-pay | Source: Home / Self Care | Attending: Internal Medicine

## 2019-07-28 ENCOUNTER — Ambulatory Visit (HOSPITAL_COMMUNITY)
Admission: RE | Admit: 2019-07-28 | Discharge: 2019-07-29 | Disposition: A | Discharge status: Home / Self Care | Payer: Medicare Other | Attending: Internal Medicine | Admitting: Internal Medicine

## 2019-07-28 DIAGNOSIS — Z880 Allergy status to penicillin: Secondary | ICD-10-CM | POA: Insufficient documentation

## 2019-07-28 DIAGNOSIS — Z8673 Personal history of transient ischemic attack (TIA), and cerebral infarction without residual deficits: Secondary | ICD-10-CM | POA: Diagnosis present

## 2019-07-28 DIAGNOSIS — Z950 Presence of cardiac pacemaker: Secondary | ICD-10-CM | POA: Diagnosis not present

## 2019-07-28 DIAGNOSIS — Z96642 Presence of left artificial hip joint: Secondary | ICD-10-CM | POA: Insufficient documentation

## 2019-07-28 DIAGNOSIS — I4430 Unspecified atrioventricular block: Secondary | ICD-10-CM

## 2019-07-28 DIAGNOSIS — Z9889 Other specified postprocedural states: Secondary | ICD-10-CM

## 2019-07-28 DIAGNOSIS — Z7901 Long term (current) use of anticoagulants: Secondary | ICD-10-CM | POA: Diagnosis not present

## 2019-07-28 DIAGNOSIS — E669 Obesity, unspecified: Secondary | ICD-10-CM | POA: Insufficient documentation

## 2019-07-28 DIAGNOSIS — Z7983 Long term (current) use of bisphosphonates: Secondary | ICD-10-CM | POA: Insufficient documentation

## 2019-07-28 DIAGNOSIS — D649 Anemia, unspecified: Secondary | ICD-10-CM | POA: Insufficient documentation

## 2019-07-28 DIAGNOSIS — T50905A Adverse effect of unspecified drugs, medicaments and biological substances, initial encounter: Secondary | ICD-10-CM | POA: Diagnosis not present

## 2019-07-28 DIAGNOSIS — Z888 Allergy status to other drugs, medicaments and biological substances status: Secondary | ICD-10-CM | POA: Insufficient documentation

## 2019-07-28 DIAGNOSIS — I639 Cerebral infarction, unspecified: Secondary | ICD-10-CM | POA: Diagnosis present

## 2019-07-28 DIAGNOSIS — R5383 Other fatigue: Secondary | ICD-10-CM | POA: Diagnosis not present

## 2019-07-28 DIAGNOSIS — I4821 Permanent atrial fibrillation: Secondary | ICD-10-CM | POA: Insufficient documentation

## 2019-07-28 DIAGNOSIS — E039 Hypothyroidism, unspecified: Secondary | ICD-10-CM | POA: Diagnosis not present

## 2019-07-28 DIAGNOSIS — Z7982 Long term (current) use of aspirin: Secondary | ICD-10-CM | POA: Insufficient documentation

## 2019-07-28 DIAGNOSIS — Z79890 Hormone replacement therapy (postmenopausal): Secondary | ICD-10-CM | POA: Insufficient documentation

## 2019-07-28 DIAGNOSIS — L659 Nonscarring hair loss, unspecified: Secondary | ICD-10-CM | POA: Diagnosis not present

## 2019-07-28 DIAGNOSIS — Z6830 Body mass index (BMI) 30.0-30.9, adult: Secondary | ICD-10-CM | POA: Insufficient documentation

## 2019-07-28 DIAGNOSIS — Z959 Presence of cardiac and vascular implant and graft, unspecified: Secondary | ICD-10-CM

## 2019-07-28 DIAGNOSIS — F329 Major depressive disorder, single episode, unspecified: Secondary | ICD-10-CM | POA: Insufficient documentation

## 2019-07-28 DIAGNOSIS — I4891 Unspecified atrial fibrillation: Secondary | ICD-10-CM | POA: Diagnosis present

## 2019-07-28 DIAGNOSIS — Z79899 Other long term (current) drug therapy: Secondary | ICD-10-CM | POA: Diagnosis not present

## 2019-07-28 HISTORY — PX: PACEMAKER IMPLANT: EP1218

## 2019-07-28 HISTORY — PX: AV NODE ABLATION: EP1193

## 2019-07-28 HISTORY — DX: Unspecified atrioventricular block: I44.30

## 2019-07-28 SURGERY — PACEMAKER IMPLANT

## 2019-07-28 MED ORDER — FENTANYL CITRATE (PF) 100 MCG/2ML IJ SOLN
INTRAMUSCULAR | Status: DC | PRN
  Administered 2019-07-28 (×4): 25 ug via INTRAVENOUS

## 2019-07-28 MED ORDER — SODIUM CHLORIDE 0.9 % IV SOLN: 250.0000 mL | INTRAVENOUS | Status: DC | PRN

## 2019-07-28 MED ORDER — ASPIRIN EC 81 MG PO TBEC
81.0000 mg | DELAYED_RELEASE_TABLET | Freq: Every day | ORAL | Status: DC
  Administered 2019-07-29: 81 mg via ORAL
  Filled 2019-07-28: qty 1

## 2019-07-28 MED ORDER — VANCOMYCIN HCL IN DEXTROSE 1-5 GM/200ML-% IV SOLN
1000.0000 mg | INTRAVENOUS | Status: AC
  Administered 2019-07-28: 1000 mg via INTRAVENOUS

## 2019-07-28 MED ORDER — METOPROLOL TARTRATE 25 MG PO TABS
25.0000 mg | ORAL_TABLET | Freq: Two times a day (BID) | ORAL | Status: DC
  Administered 2019-07-28 – 2019-07-29 (×2): 25 mg via ORAL
  Filled 2019-07-28 (×2): qty 1

## 2019-07-28 MED ORDER — ALBUTEROL SULFATE HFA 108 (90 BASE) MCG/ACT IN AERS: 2.0000 | INHALATION_SPRAY | RESPIRATORY_TRACT | Status: DC | PRN

## 2019-07-28 MED ORDER — VANCOMYCIN HCL IN DEXTROSE 1-5 GM/200ML-% IV SOLN
INTRAVENOUS | Status: AC
  Filled 2019-07-28: qty 200

## 2019-07-28 MED ORDER — DIPHENHYDRAMINE HCL 25 MG PO CAPS: 50.0000 mg | ORAL_CAPSULE | Freq: Four times a day (QID) | ORAL | Status: DC | PRN

## 2019-07-28 MED ORDER — MIDAZOLAM HCL 5 MG/5ML IJ SOLN
INTRAMUSCULAR | Status: AC
  Filled 2019-07-28: qty 5

## 2019-07-28 MED ORDER — LORATADINE 10 MG PO TABS: 10.0000 mg | ORAL_TABLET | Freq: Every day | ORAL | Status: DC | PRN

## 2019-07-28 MED ORDER — ACETAMINOPHEN 325 MG PO TABS
ORAL_TABLET | ORAL | Status: AC
  Filled 2019-07-28: qty 2

## 2019-07-28 MED ORDER — HEPARIN (PORCINE) IN NACL 1000-0.9 UT/500ML-% IV SOLN
INTRAVENOUS | Status: DC | PRN
  Administered 2019-07-28 (×2): 500 mL

## 2019-07-28 MED ORDER — SODIUM CHLORIDE 0.9% FLUSH: 3.0000 mL | INTRAVENOUS | Status: DC | PRN

## 2019-07-28 MED ORDER — BUPIVACAINE HCL (PF) 0.25 % IJ SOLN
INTRAMUSCULAR | Status: DC | PRN
  Administered 2019-07-28: 60 mL

## 2019-07-28 MED ORDER — ACETAMINOPHEN 325 MG PO TABS
325.0000 mg | ORAL_TABLET | ORAL | Status: DC | PRN
  Administered 2019-07-28: 650 mg via ORAL
  Filled 2019-07-28: qty 2

## 2019-07-28 MED ORDER — FUROSEMIDE 40 MG PO TABS: 40.0000 mg | ORAL_TABLET | Freq: Every day | ORAL | Status: DC | PRN

## 2019-07-28 MED ORDER — SODIUM CHLORIDE 0.9 % IV SOLN: INTRAVENOUS | Status: DC

## 2019-07-28 MED ORDER — SODIUM CHLORIDE 0.9 % IV SOLN
80.0000 mg | INTRAVENOUS | Status: AC
  Administered 2019-07-28: 80 mg
  Filled 2019-07-28: qty 2

## 2019-07-28 MED ORDER — FENTANYL CITRATE (PF) 100 MCG/2ML IJ SOLN
INTRAMUSCULAR | Status: AC
  Filled 2019-07-28: qty 2

## 2019-07-28 MED ORDER — SODIUM CHLORIDE 0.9% FLUSH
3.0000 mL | Freq: Two times a day (BID) | INTRAVENOUS | Status: DC
  Administered 2019-07-28: 3 mL via INTRAVENOUS

## 2019-07-28 MED ORDER — ACETAMINOPHEN 325 MG PO TABS
650.0000 mg | ORAL_TABLET | ORAL | Status: DC | PRN
  Administered 2019-07-28 – 2019-07-29 (×2): 650 mg via ORAL
  Filled 2019-07-28 (×2): qty 2

## 2019-07-28 MED ORDER — DIPHENHYDRAMINE-ZINC ACETATE 1-0.1 % EX CREA: 1.0000 "application " | TOPICAL_CREAM | Freq: Three times a day (TID) | CUTANEOUS | Status: DC | PRN

## 2019-07-28 MED ORDER — POLYETHYL GLYCOL-PROPYL GLYCOL 0.4-0.3 % OP SOLN: 1.0000 [drp] | Freq: Three times a day (TID) | OPHTHALMIC | Status: DC | PRN

## 2019-07-28 MED ORDER — LEVOTHYROXINE SODIUM 25 MCG PO TABS
25.0000 ug | ORAL_TABLET | Freq: Every day | ORAL | Status: DC
  Administered 2019-07-29: 25 ug via ORAL
  Filled 2019-07-28: qty 1

## 2019-07-28 MED ORDER — EZETIMIBE 10 MG PO TABS
10.0000 mg | ORAL_TABLET | Freq: Every day | ORAL | Status: DC
  Administered 2019-07-29: 10 mg via ORAL
  Filled 2019-07-28: qty 1

## 2019-07-28 MED ORDER — LIDOCAINE HCL (PF) 1 % IJ SOLN
INTRAMUSCULAR | Status: DC | PRN
  Administered 2019-07-28: 60 mL

## 2019-07-28 MED ORDER — CHLORHEXIDINE GLUCONATE 4 % EX LIQD
4.0000 "application " | Freq: Once | CUTANEOUS | Status: DC
  Filled 2019-07-28: qty 60

## 2019-07-28 MED ORDER — MIDAZOLAM HCL 5 MG/5ML IJ SOLN
INTRAMUSCULAR | Status: DC | PRN
  Administered 2019-07-28 (×4): 1 mg via INTRAVENOUS

## 2019-07-28 MED ORDER — FERROUS SULFATE 325 (65 FE) MG PO TABS
325.0000 mg | ORAL_TABLET | Freq: Every day | ORAL | Status: DC
  Administered 2019-07-29: 325 mg via ORAL
  Filled 2019-07-28: qty 1

## 2019-07-28 MED ORDER — ONDANSETRON HCL 4 MG/2ML IJ SOLN: 4.0000 mg | Freq: Four times a day (QID) | INTRAMUSCULAR | Status: DC | PRN

## 2019-07-28 MED ORDER — SODIUM CHLORIDE 0.9 % IV SOLN
INTRAVENOUS | Status: AC
  Filled 2019-07-28: qty 2

## 2019-07-28 MED ORDER — LIDOCAINE HCL 1 % IJ SOLN
INTRAMUSCULAR | Status: AC
  Filled 2019-07-28: qty 60

## 2019-07-28 MED ORDER — FLUTICASONE PROPIONATE 50 MCG/ACT NA SUSP: 1.0000 | Freq: Every day | NASAL | Status: DC | PRN

## 2019-07-28 MED ORDER — HEPARIN (PORCINE) IN NACL 1000-0.9 UT/500ML-% IV SOLN
INTRAVENOUS | Status: AC
  Filled 2019-07-28: qty 500

## 2019-07-28 MED ORDER — VANCOMYCIN HCL IN DEXTROSE 1-5 GM/200ML-% IV SOLN
1000.0000 mg | Freq: Two times a day (BID) | INTRAVENOUS | Status: AC
  Administered 2019-07-28: 1000 mg via INTRAVENOUS
  Filled 2019-07-28: qty 200

## 2019-07-28 MED ORDER — ACETAMINOPHEN 325 MG PO TABS: 325.0000 mg | ORAL_TABLET | Freq: Four times a day (QID) | ORAL | Status: DC | PRN

## 2019-07-28 MED ORDER — BUPIVACAINE HCL (PF) 0.25 % IJ SOLN
INTRAMUSCULAR | Status: AC
  Filled 2019-07-28: qty 60

## 2019-07-28 SURGICAL SUPPLY — 14 items
CABLE SURGICAL S-101-97-12 (CABLE) ×3 IMPLANT
CATH BLAZER 7FR 4MM LG 5031TK2 (ABLATOR) ×2 IMPLANT
HEMOSTAT SURGICEL 2X4 FIBR (HEMOSTASIS) ×2 IMPLANT
INTRODUCER SWARTZ SRO 8F (SHEATH) ×2 IMPLANT
IPG PACE AZUR XT DR MRI W1DR01 (Pacemaker) IMPLANT
LEAD CAPSURE NOVUS 5076-58CM (Lead) ×2 IMPLANT
PACE AZURE XT DR MRI W1DR01 (Pacemaker) ×3 IMPLANT
PACK EP LATEX FREE (CUSTOM PROCEDURE TRAY) ×3
PACK EP LF (CUSTOM PROCEDURE TRAY) ×1 IMPLANT
PAD PRO RADIOLUCENT 2001M-C (PAD) ×3 IMPLANT
SHEATH 7FR PRELUDE SNAP 13 (SHEATH) ×4 IMPLANT
SHEATH PINNACLE 8F 10CM (SHEATH) ×4 IMPLANT
TRAY PACEMAKER INSERTION (PACKS) ×3 IMPLANT
WIRE HI TORQ VERSACORE-J 145CM (WIRE) ×2 IMPLANT

## 2019-07-28 NOTE — Progress Notes (Signed)
Site area: rt groin fv sheath Site Prior to Removal:  Level 0 Pressure Applied For: 10 minutes Manual:   yes Patient Status During Pull:  stable Post Pull Site:  Level 0 Post Pull Instructions Given:  yes Post Pull Pulses Present: rt dp palpable Dressing Applied:  Gauze and tegaderm Bedrest begins @ [id number] Comments:

## 2019-07-28 NOTE — H&P (Signed)
Patient Care Team: Tower, Wynelle Fanny, MD as PCP - General   HPI  [name] is a 86 y.o. female admitted for pacing and AV ablation  Has a trial fib with RVR  the patient reports intercurrent hospitalization for stroke while on warfarin w subtherapeutic INR, now on ASA and apixoban   Felt better off dilt.    Extremely fatigued -- needing to sleep - but also extremely limited exercise tolerance.  HR increases rapidly according to FItBit with the least effort   No chest pain     DATE TEST EF   10/14 Echo  55-60 %   2/17 Echo  50-55 %   9/18 Echo  55-65%   3/21 Echo 55-65% LAE-severe     Date Cr K TSH Hgb  6/18    13.9  2/19 0.74   9.8  1/20 0.83  4.06(11/19) 12.6  6/20 0.84  5.04 12.1  8/20   3.74   5/21 0.84 4.6   12.8     Records and Results Reviewed   Past Medical History:  Diagnosis Date  . Allergic rhinitis   . 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 Arthritis   . Atrial fibrillation -persistent cardiologist-  dr Ahmia Colford/  primary EP -- dr Tawanna Sat ([facility])   a. s/p PVI [name] 2010;  b. on tikosyn/coumadin;  c. 05/2009 Echo: EF 60-65%, Gr 2 DD. (first dx 09/ 2007)  . Bilateral lower extremity edema   . Bleeding hemorrhoid   . Carotid stenosis    mild (hosp 3/11)- consult by vasc/ Dr Donnetta Hutching  . Complication of anesthesia    hard to wake  . Diverticulosis of colon   . Dyspnea    on exertion-climbing stairs  . Fatty liver   . H/O cardiac radiofrequency ablation    01/ 2008 at [facility] /  03/ 2010  at [facility]  . Heart failure with preserved ejection fraction ([place])   . History of adenomatous polyp of colon    tubular adenoma's  . History of cardiomyopathy    secondary tachycardia-induced cardiomyopathy -- resolved 2014  . History of squamous cell carcinoma in situ (SCCIS) of skin    05/ 2017  nasal bridge and right medial knee  . History of transient ischemic attack (TIA)    01-24-2005 and 06-12-2009  . Hyperlipidemia   .  Hypothyroidism   . Mild intermittent asthma    reacts to cats  . Mixed stress and urge urinary incontinence   . Pulmonary nodule   . S/P mitral valve repair 10-23-1998  dr Boyce Medici at [facility]   for MVP and regurg. (annuloplasty ring procedure)    Past Surgical History:  Procedure Laterality Date  . APPENDECTOMY  1978  . BUBBLE STUDY  06/19/2019   Procedure: BUBBLE STUDY;  Surgeon: Pixie Casino, MD;  Location: [facility] ENDOSCOPY;  Service: Cardiovascular;;  . CARDIAC ELECTROPHYSIOLOGY [place]  01/ 2008    at [facility]   right-sided ablation atrial flutter  . CARDIAC ELECTROPHYSIOLOGY STUDY AND ABLATION  03/ 2010   dr Jaymes Graff at [facility]   AV node ablation and pulmonary vein isolation for atrial fib  . CARDIOVERSION  06-18-2006;  07-13-2006;  10-19-2010;  10-27-2010  . COLONOSCOPY    . COLONOSCOPY WITH PROPOFOL N/A 10/13/2017   Procedure: COLONOSCOPY WITH PROPOFOL;  Surgeon: [name], MD;  Location: [facility] ENDOSCOPY;  Service: Gastroenterology;  Laterality: N/A;  . CYSTO/ TRANSURETHRAL COLLAGEN INJECTION  THERAPY  07-26-2007   dr Matilde Sprang  . DILATION AND CURETTAGE OF UTERUS    . ESOPHAGOGASTRODUODENOSCOPY (EGD) WITH PROPOFOL N/A 10/13/2017   Procedure: ESOPHAGOGASTRODUODENOSCOPY (EGD) WITH PROPOFOL;  Surgeon: [name], MD;  Location: [facility] ENDOSCOPY;  Service: Gastroenterology;  Laterality: N/A;  . EXCISIONAL HEMORRHOIDECTOMY  1980s  . GIVENS CAPSULE STUDY N/A 12/08/2017   Procedure: GIVENS CAPSULE STUDY;  Surgeon: [name], MD;  Location: [facility] ENDOSCOPY;  Service: Gastroenterology;  Laterality: N/A;  . HEMORRHOID SURGERY N/A 10/29/2016   Procedure: HEMORRHOIDECTOMY;  Surgeon: Leighton Ruff, MD;  Location: [facility];  Service: General;  Laterality: N/A;  . MITRAL VALVE ANNULOPLASTY  10/23/1998   "Model [id number]; Campbell Lerner [id number]"; size 56mm; [facility]; Dr. Boyce Medici  . PILONIDAL CYST EXCISION  1954  . TEE WITH CARDIOVERSION  05-06-2006 at  [facility];  01-02-2013 at [facility]  . TEE WITHOUT CARDIOVERSION N/A 06/19/2019   Procedure: TRANSESOPHAGEAL ECHOCARDIOGRAM (TEE);  Surgeon: Pixie Casino, MD;  Location: [facility] ENDOSCOPY;  Service: Cardiovascular;  Laterality: N/A;  . TOTAL HIP ARTHROPLASTY Left 05/04/2017   Procedure: LEFT TOTAL HIP ARTHROPLASTY ANTERIOR APPROACH;  Surgeon: Mcarthur Rossetti, MD;  Location: [place];  Service: Orthopedics;  Laterality: Left;  . TRANSTHORACIC ECHOCARDIOGRAM  05-01-2015   dr Caryl Comes   ef 50-55%/  mild AV sclerosis without stenosis/  post MV repair with mild central MR (valve area by pressure half-time 2cm^2,  valve area by continutity equation 0.91cm^2, peak grandiant 1mmHg)/  severe LAE/ mild TR/ mild RAE   . TUBAL LIGATION Bilateral 1978    Current Facility-Administered Medications  Medication Dose Route Frequency Provider Last Rate Last Admin  . 0.9 %  sodium chloride infusion   Intravenous Continuous Deboraha Sprang, MD 50 mL/hr at 07/28/19 0657 New Bag at 07/28/19 0657  . chlorhexidine (HIBICLENS) 4 % liquid 4 application  4 application Topical Once Deboraha Sprang, MD      . gentamicin (GARAMYCIN) 80 mg in sodium chloride 0.9 % 500 mL irrigation  80 mg Irrigation On Call Deboraha Sprang, MD      . vancomycin (VANCOCIN) IVPB 1000 mg/200 mL premix  1,000 mg Intravenous On Call Deboraha Sprang, MD        Allergies  Allergen Reactions  . Amiodarone Hcl Swelling    SWELLING REACTION UNSPECIFIED   . Penicillins Rash    Has patient had a PCN reaction causing immediate rash, facial/tongue/throat swelling, SOB or lightheadedness with hypotension: No Has patient had a PCN reaction causing severe rash involving mucus membranes or skin necrosis: No Has patient had a PCN reaction that required hospitalization:Patient was inpatient when reaction occurred Has patient had a PCN reaction occurring within the last 10 years: No If all of the above answers are "NO", then may proceed with Cephalosporin use.   .  Statins Rash      Social History   Tobacco Use  . Smoking status: Never Smoker  . Smokeless tobacco: Never Used  Substance Use Topics  . Alcohol use: Yes    Alcohol/week: 0.0 standard drinks    Comment: seldom  . Drug use: No     Family History  Problem Relation Age of Onset  . Lung cancer Father        smoker, died at 32  . Alcohol abuse Father   . Cancer Father        bladder and lung CA smoker  . Breast cancer Neg Hx   . Stroke Neg Hx  Current Meds  Medication Sig  . acetaminophen (TYLENOL) 325 MG tablet Take 325 mg by mouth every 6 (six) hours as needed for moderate pain or headache.  . alendronate (FOSAMAX) 70 MG tablet TAKE 1 TABLET EVERY 7 DAYS. TAKE WITH A FULL GLASS OF WATER ON AN EMPTY STOMACH.  [name] aspirin EC 81 MG tablet Take 81 mg by mouth daily.  [name] ezetimibe (ZETIA) 10 MG tablet Take 1 tablet (10 mg total) by mouth daily.  . ferrous sulfate 325 (65 FE) MG EC tablet Take 1 tablet (325 mg total) by mouth daily with breakfast.  . furosemide (LASIX) 40 MG tablet Take 40 mg by mouth daily as needed for edema.  [name] levothyroxine (SYNTHROID) 25 MCG tablet Take 1 tablet (25 mcg total) by mouth daily before breakfast.  . loratadine (CLARITIN) 10 MG tablet Take 10 mg by mouth daily as needed for allergies.   . metoprolol tartrate (LOPRESSOR) 50 MG tablet Take 1 tablet (50 mg total) by mouth 2 (two) times daily.  Vladimir Faster Glycol-Propyl Glycol (LUBRICANT EYE DROPS) 0.4-0.3 % SOLN Place 1-2 drops into both eyes 3 (three) times daily as needed (for dry eyes.).  . [DISCONTINUED] apixaban (ELIQUIS) 5 MG TABS tablet Take 1 tablet (5 mg total) by mouth 2 (two) times daily.     Review of Systems negative except from HPI and PMH  Physical Exam BP 129/82   Pulse 92   Temp 97.7 F (36.5 C) (Oral)   Resp 14   Ht 5\' 5"  (1.651 m)   Wt 83.9 kg   SpO2 97%   BMI 30.79 kg/m  Well developed and well nourished in no acute distress HENT normal E scleral and icterus  clear Neck Supple JVP flat; carotids brisk and full Clear to ausculation Irregular rate and rhythm, no murmurs gallops or rub Soft with active bowel sounds No clubbing cyanosis   Edema Alert and oriented, grossly normal motor and sensory function Skin Warm and Dry    Assessment and  Plan Atrial fibrillation-permanent  RVR    Stroke L side  Dypsnea on exertion   Anemia   Alopecia with beta-blockers  Obesity  fatigue  Depression  For AV ablation and pacing   Reviewed risks and benefits Pacing first and AV ablation to follow

## 2019-07-28 NOTE — Progress Notes (Signed)
Orthopedic Tech Progress Note Patient Details:  [name] April 26, 1932 [id number] RN said patient has arm sling Patient ID: [name], female   DOB: 10-02-1932, 86 y.o.   MRN: [id number]   [name] 07/28/2019, 5:20 PM

## 2019-07-28 NOTE — Progress Notes (Addendum)
   Will plan resumption of Apixoban  On Sunday pm

## 2019-07-29 ENCOUNTER — Ambulatory Visit (HOSPITAL_COMMUNITY): Payer: Medicare Other

## 2019-07-29 ENCOUNTER — Encounter (HOSPITAL_COMMUNITY): Payer: Self-pay | Admitting: Internal Medicine

## 2019-07-29 DIAGNOSIS — F329 Major depressive disorder, single episode, unspecified: Secondary | ICD-10-CM | POA: Diagnosis not present

## 2019-07-29 DIAGNOSIS — D649 Anemia, unspecified: Secondary | ICD-10-CM | POA: Diagnosis not present

## 2019-07-29 DIAGNOSIS — I4891 Unspecified atrial fibrillation: Secondary | ICD-10-CM

## 2019-07-29 DIAGNOSIS — E039 Hypothyroidism, unspecified: Secondary | ICD-10-CM | POA: Diagnosis not present

## 2019-07-29 DIAGNOSIS — Z7983 Long term (current) use of bisphosphonates: Secondary | ICD-10-CM | POA: Diagnosis not present

## 2019-07-29 DIAGNOSIS — T50905A Adverse effect of unspecified drugs, medicaments and biological substances, initial encounter: Secondary | ICD-10-CM | POA: Diagnosis not present

## 2019-07-29 DIAGNOSIS — I4821 Permanent atrial fibrillation: Secondary | ICD-10-CM | POA: Diagnosis not present

## 2019-07-29 DIAGNOSIS — Z7982 Long term (current) use of aspirin: Secondary | ICD-10-CM | POA: Diagnosis not present

## 2019-07-29 DIAGNOSIS — I4430 Unspecified atrioventricular block: Secondary | ICD-10-CM | POA: Diagnosis not present

## 2019-07-29 DIAGNOSIS — R5383 Other fatigue: Secondary | ICD-10-CM | POA: Diagnosis not present

## 2019-07-29 DIAGNOSIS — Z7901 Long term (current) use of anticoagulants: Secondary | ICD-10-CM | POA: Diagnosis not present

## 2019-07-29 DIAGNOSIS — Z950 Presence of cardiac pacemaker: Secondary | ICD-10-CM | POA: Diagnosis not present

## 2019-07-29 DIAGNOSIS — E669 Obesity, unspecified: Secondary | ICD-10-CM | POA: Diagnosis not present

## 2019-07-29 DIAGNOSIS — L659 Nonscarring hair loss, unspecified: Secondary | ICD-10-CM | POA: Diagnosis not present

## 2019-07-29 DIAGNOSIS — Z9889 Other specified postprocedural states: Secondary | ICD-10-CM

## 2019-07-29 HISTORY — DX: Other specified postprocedural states: Z98.890

## 2019-07-29 HISTORY — DX: Presence of cardiac pacemaker: Z95.0

## 2019-07-29 MED ORDER — APIXABAN 5 MG PO TABS: 5.0000 mg | ORAL_TABLET | Freq: Two times a day (BID) | ORAL | 1 refills | Status: DC

## 2019-07-29 MED ORDER — YOU HAVE A PACEMAKER BOOK
Freq: Once | Status: AC
  Administered 2019-07-29: 1
  Filled 2019-07-29: qty 1

## 2019-07-29 MED ORDER — METOPROLOL TARTRATE 25 MG PO TABS: 25.0000 mg | ORAL_TABLET | Freq: Two times a day (BID) | ORAL | 6 refills | Status: DC

## 2019-07-29 NOTE — Progress Notes (Addendum)
Progress Note  Patient Name: [name] Date of Encounter: 07/29/2019  Primary Cardiologist: Virl Axe, MD  EP: SK  Subjective   Just back from the bathroom. No complaints   Inpatient Medications    Scheduled Meds: . aspirin EC  81 mg Oral Daily  . ezetimibe  10 mg Oral Daily  . ferrous sulfate  325 mg Oral Q breakfast  . levothyroxine  25 mcg Oral Q0600  . metoprolol tartrate  25 mg Oral BID  . sodium chloride flush  3 mL Intravenous Q12H   Continuous Infusions: . sodium chloride     PRN Meds: sodium chloride, acetaminophen, furosemide, loratadine, ondansetron (ZOFRAN) IV, sodium chloride flush   Vital Signs    Vitals:   07/29/19 0030 07/29/19 0031 07/29/19 0544 07/29/19 0935  BP:  129/74 105/60 114/61  Pulse: 89 89 89 90  Resp: 16 14 16    Temp:  97.8 F (36.6 C) 97.6 F (36.4 C)   TempSrc:  Oral Oral   SpO2: 95% 96% 96%   Weight:   87.9 kg   Height:        Intake/Output Summary (Last 24 hours) at 07/29/2019 1056 Last data filed at 07/28/2019 2245 Gross per 24 hour  Intake 240 ml  Output 800 ml  Net -560 ml   Last 3 Weights 07/29/2019 07/28/2019 06/29/2019  Weight (lbs) 193 lb 12.6 oz 185 lb 184 lb  Weight (kg) 87.9 kg 83.915 kg 83.462 kg      Telemetry    A fib with V pacing  - Personally Reviewed  ECG    A fib with V pacing  - Personally Reviewed  Physical Exam   GEN: No acute distress.   Neck: No JVD Cardiac: RRR, no murmurs, rubs, or gallops. Pacer site with mild edema no drainage and no erythema.  Respiratory: Clear to auscultation bilaterally. GI: Soft, nontender, non-distended  MS: No edema; No deformity. Neuro:  Nonfocal  Psych: Normal affect   Labs    High Sensitivity Troponin:  No results for input(s): TROPONINIHS in the last 720 hours.    Chemistry Recent Labs  Lab 07/25/19 1203  NA 139  K 4.6  CL 104  CO2 28  GLUCOSE 63*  BUN 16  CREATININE 0.84  CALCIUM 9.3  GFRNONAA 64  GFRAA 74     Hematology Recent Labs    Lab 07/25/19 1203  WBC 5.7  RBC 4.58  HGB 12.8  HCT 38.7  MCV 85  MCH 27.9  MCHC 33.1  RDW 15.7*  PLT 192    BNPNo results for input(s): BNP, PROBNP in the last 168 hours.   DDimer No results for input(s): DDIMER in the last 168 hours.   Radiology    DG Chest 2 View  Result Date: 07/29/2019 CLINICAL DATA:  Cardiac device placement EXAM: CHEST - 2 VIEW COMPARISON:  Radiograph 04/12/2018 FINDINGS: LEFT-sided generator pack with single continuous lead overlies normal cardiac silhouette. No effusion, infiltrate or pneumothorax. IMPRESSION: LEFT-sided generator pack. No pneumothorax. Electronically Signed   By: Suzy Bouchard M.D.   On: 07/29/2019 09:05    Cardiac Studies   07/28/19 AV ablation 07/28/19  Technical Details Preop DX:: atrial fib with impending av ablation  Post op DX:: same   Procedure singlepacemaker implantation  After routine prep and drape, lidocaine was infiltrated in the prepectoral subclavicular region on the left side an incision was made and carried down to layer of the prepectoral fascia using electrocautery and sharp dissection;  a pocket was formed similarly. Hemostasis was obtained.  After this, we turned our attention to gaining access to the extrathoracic, left subclavian vein. This was accomplished without difficulty and without the aspiration of air or puncture of the artery. 1  venipuncture was accomplished; one guidewire was placed and retained and   7 French sheath  waw placed through which we  passed a  Medtronic MRI compatible 5076 ventricular lead serial number [id number] and an atrial port plug .  The ventricular lead was manipulated to the right ventricular apex with a bipolar R wave was 11.2, the pacing impedance was 1096, the threshold was 0.9 @ 0.5 msec  Current at threshold was   0.9  Ma and the current of injury was  brisk.    The lead was affixed to the prepectoral fascia and attached to a  Medtronic MRI compatible  pulse generator serial  number [id number].  Hemostasis was obtained. The pocket was copiously irrigated with antibiotic containing saline solution. The lead and the pulse generator were placed in the pocket and affixed to the prepectoral fascia.  Surgicell placed in pocket .     The wound was then closed in 2 layers in the normal fashion.  The wound was washed dried . And a dermabond adhesive was applied   THE Pt prepped for AV ablation   Needle  Count, sponge counts and instrument counts were correct at the end of the procedure .  The patient tolerated the procedure without apparent complication.        Patient Profile     86 y.o. female admitted for pacing and AV ablation.  Hx of atrial fib with RVR, CVA, + fatigue -HR increases with little movement.  Admitted for AV ablation and pacing.   Assessment & Plan    Symptomatic a fib with intolerance to meds with fatigue.  Now post AV ablation and PPM not yet interrogated. That I see. But pt progressing well.  --CXR with no pneumothorax.   Permanent atrial fib can resume eliquis Sunday evening.    Hx of CVA on asa with eliquis.   For questions or updates, please contact [place] Please consult [email or web address] for contact info under     Signed, Cecilie Kicks, NP  07/29/2019, 10:56 AM    EP Attending  Patient seen and examined. She is doing well after PPM insertion. She has a small hematoma. I will ask her to hold Eliquis for 3 days, restarting on Tuesday. PPM interrogation under my direction demonstrates normal device function. CXR ok.   Mikle Bosworth.D.

## 2019-07-29 NOTE — Discharge Summary (Addendum)
Discharge Summary    Patient ID: [name] MRN: [id number]; DOB: 1933-02-27  Admit date: 07/28/2019 Discharge date: 07/29/2019  Primary Care Provider: Abner Greenspan, MD  Primary Cardiologist: Virl Axe, MD  Primary Electrophysiologist:  None Dr. Caryl Comes  Discharge Diagnoses    Principal Problem:   Rapid atrial fibrillation [facility]) Active Problems:   S/P AV nodal ablation 07/28/19   S/P placement of cardiac pacemaker MDT 07/28/19   CVA (cerebral vascular accident) [facility])   AV block    Diagnostic Studies/Procedures    07/28/19 Technical Details Preop DX:: atrial fib with impending av ablation  Post op DX:: same   Procedure singlepacemaker implantation  After routine prep and drape, lidocaine was infiltrated in the prepectoral subclavicular region on the left side an incision was made and carried down to layer of the prepectoral fascia using electrocautery and sharp dissection; a pocket was formed similarly. Hemostasis was obtained.  After this, we turned our attention to gaining access to the extrathoracic, left subclavian vein. This was accomplished without difficulty and without the aspiration of air or puncture of the artery. 1  venipuncture was accomplished; one guidewire was placed and retained and   7 French sheath  waw placed through which we  passed a  Medtronic MRI compatible 5076 ventricular lead serial number [id number] and an atrial port plug .  The ventricular lead was manipulated to the right ventricular apex with a bipolar R wave was 11.2, the pacing impedance was 1096, the threshold was 0.9 @ 0.5 msec  Current at threshold was   0.9  Ma and the current of injury was  brisk.    The lead was affixed to the prepectoral fascia and attached to a  Medtronic MRI compatible  pulse generator serial number [id number].  Hemostasis was obtained. The pocket was copiously irrigated with antibiotic containing saline solution. The lead and the pulse generator were placed in the pocket  and affixed to the prepectoral fascia.  Surgicell placed in pocket .     The wound was then closed in 2 layers in the normal fashion.  The wound was washed dried . And a dermabond adhesive was applied   THE Pt prepped for AV ablation   Needle  Count, sponge counts and instrument counts were correct at the end of the procedure .  The patient tolerated the procedure without apparent complication.      And AV node ablation  _____________   History of Present Illness     [name] is a 86 y.o. female with hx of permanent atrial fib and recent hospitalization with CVA on warfarin with subtherapeutic INR.  Now on ASA and eliquis.  Her dilt was stopped to side effects.   HR increases easily with exertion. It has been difficult to proceed with ADLs due to increased HR.  Sr. Caryl Comes reviewed options and plan for  AV ablation and PPM.  Pre-procedure labs and covid were normal and neg.    This was arranged electively and pt presented 07/28/19 and underwent AV ablation and PPM with MDT.  She did well.   Hospital Course     Consultants: none   Pt was kept overnight and today she is without complaints.  Her pacer site with mild swelling.  No pneumothorax and CXR.  Her PPM interrogated and numbers were great, reviewed by Dr. Lovena Le.  She has been up to BR without complications.   She will resume eliquis on Tuesday morning due to swelling.  She  has been seen and evaluated by Dr. Lovena Le and found stable for discharge.    Did the patient have an acute coronary syndrome (MI, NSTEMI, STEMI, etc) this admission?:  No                               Did the patient have a percutaneous coronary intervention (stent / angioplasty)?:  No.   _____________  Discharge Vitals Blood pressure 114/61, pulse 90, temperature 97.6 F (36.4 C), temperature source Oral, resp. rate 16, height 5\' 5"  (1.651 m), weight 87.9 kg, SpO2 96 %.  Filed Weights   07/28/19 0558 07/29/19 0544  Weight: 83.9 kg 87.9 kg    Labs &  Radiologic Studies    CBC No results for input(s): WBC, NEUTROABS, HGB, HCT, MCV, PLT in the last 72 hours. Basic Metabolic Panel No results for input(s): NA, K, CL, CO2, GLUCOSE, BUN, CREATININE, CALCIUM, MG, PHOS in the last 72 hours. Liver Function Tests No results for input(s): AST, ALT, ALKPHOS, BILITOT, PROT, ALBUMIN in the last 72 hours. No results for input(s): LIPASE, AMYLASE in the last 72 hours. High Sensitivity Troponin:   No results for input(s): TROPONINIHS in the last 720 hours.  BNP Invalid input(s): POCBNP D-Dimer No results for input(s): DDIMER in the last 72 hours. Hemoglobin A1C No results for input(s): HGBA1C in the last 72 hours. Fasting Lipid Panel No results for input(s): CHOL, HDL, LDLCALC, TRIG, CHOLHDL, LDLDIRECT in the last 72 hours. Thyroid Function Tests No results for input(s): TSH, T4TOTAL, T3FREE, THYROIDAB in the last 72 hours.  Invalid input(s): FREET3 _____________  DG Chest 2 View  Result Date: 07/29/2019 CLINICAL DATA:  Cardiac device placement EXAM: CHEST - 2 VIEW COMPARISON:  Radiograph 04/12/2018 FINDINGS: LEFT-sided generator pack with single continuous lead overlies normal cardiac silhouette. No effusion, infiltrate or pneumothorax. IMPRESSION: LEFT-sided generator pack. No pneumothorax. Electronically Signed   By: Suzy Bouchard M.D.   On: 07/29/2019 09:05   Disposition   Pt is being discharged home today in good condition.  Follow-up Plans & Appointments  Pacemaker instructions given to pt.   Follow-up Information     [place] [facility] Follow up.   Specialty: Cardiology Why: 08/10/2019 @ 10:30AM, wound check visit Contact information: [address] [place]        Baldwin Jamaica, PA-C Follow up.   Specialty: Cardiology Why: 08/28/2019 @ 11:00AM, pacemaker evaluation Contact information: [address] [place] [address] [phone]           Deboraha Sprang, MD Follow up.   Specialty: Cardiology Why: 10/31/2019 @ 2:00PM Contact information: [address] [place] [address] [phone]              Discharge Medications   Allergies as of 07/29/2019       Reactions   Amiodarone Hcl Swelling   SWELLING REACTION UNSPECIFIED    Penicillins Rash   Has patient had a PCN reaction causing immediate rash, facial/tongue/throat swelling, SOB or lightheadedness with hypotension: No Has patient had a PCN reaction causing severe rash involving mucus membranes or skin necrosis: No Has patient had a PCN reaction that required hospitalization:Patient was inpatient when reaction occurred Has patient had a PCN reaction occurring within the last 10 years: No If all of the above answers are "NO", then may proceed with Cephalosporin use.  Statins Rash        Medication List     TAKE these medications    acetaminophen 325 MG tablet Commonly known as: TYLENOL Take 325 mg by mouth every 6 (six) hours as needed for moderate pain or headache.   albuterol 108 (90 Base) MCG/ACT inhaler Commonly known as: VENTOLIN HFA Inhale 2 puffs into the lungs every 4 (four) hours as needed for wheezing or shortness of breath.   alendronate 70 MG tablet Commonly known as: FOSAMAX TAKE 1 TABLET EVERY 7 DAYS. TAKE WITH A FULL GLASS OF WATER ON AN EMPTY STOMACH.   apixaban 5 MG Tabs tablet Commonly known as: ELIQUIS Take 1 tablet (5 mg total) by mouth 2 (two) times daily. Start taking on: Aug 01, 2019 What changed: These instructions start on Aug 01, 2019. If you are unsure what to do until then, ask your doctor or other care provider.   aspirin EC 81 MG tablet Take 81 mg by mouth daily.   diclofenac Sodium 1 % Gel Commonly known as: VOLTAREN Apply 1 application topically 4 (four) times daily as needed (pain).   diphenhydrAMINE 25 mg capsule Commonly known as: BENADRYL Take 50 mg by mouth every 6 (six) hours  as needed for itching.   diphenhydrAMINE-zinc acetate cream Commonly known as: BENADRYL Apply 1 application topically 3 (three) times daily as needed for itching.   ezetimibe 10 MG tablet Commonly known as: ZETIA Take 1 tablet (10 mg total) by mouth daily.   ferrous sulfate 325 (65 FE) MG EC tablet Take 1 tablet (325 mg total) by mouth daily with breakfast.   fluticasone 50 MCG/ACT nasal spray Commonly known as: FLONASE Place 1 spray into both nostrils daily as needed for allergies.   furosemide 40 MG tablet Commonly known as: LASIX Take 40 mg by mouth daily as needed for edema.   levothyroxine 25 MCG tablet Commonly known as: SYNTHROID Take 1 tablet (25 mcg total) by mouth daily before breakfast.   loratadine 10 MG tablet Commonly known as: CLARITIN Take 10 mg by mouth daily as needed for allergies.   Lubricant Eye Drops 0.4-0.3 % Soln Generic drug: Polyethyl Glycol-Propyl Glycol Place 1-2 drops into both eyes 3 (three) times daily as needed (for dry eyes.).   metoprolol tartrate 25 MG tablet Commonly known as: LOPRESSOR Take 1 tablet (25 mg total) by mouth 2 (two) times daily. What changed:  medication strength how much to take           Outstanding Labs/Studies   none  Duration of Discharge Encounter   Greater than 30 minutes including physician time.  Signed, Cecilie Kicks, NP 07/29/2019, 12:26 PM  Cardiology Attending  Patient seen and examined. Agree with above. The patient is stable for DC home. Usual followup. Her PM interrogation is working normally.  Mikle Bosworth.D.

## 2019-07-29 NOTE — Discharge Instructions (Signed)
R groin procedure site care  No vigorous or sexual activity for 1 week.  Keep procedure site clean & dry. If you notice increased pain, swelling, bleeding or pus, call/return!  You may shower, but no soaking baths/hot tubs/pools for 1 week.       Supplemental Discharge Instructions for  Pacemaker/Defibrillator Patients  Activity No heavy lifting or vigorous activity with your left/right arm for 6 to 8 weeks.  Do not raise your left/right arm above your head for one week.  Gradually raise your affected arm as drawn below.             08/01/2019                 08/02/2019                08/03/2019              08/04/2019 __  NO DRIVING for 1 week   ; you may begin driving on  [id number]   .  WOUND CARE - Keep the wound area clean and dry.  Do not get this area wet for 24 hours. No showers until 07/30/2019 - No bandage is needed on the site.  DO  NOT apply any creams, oils, or ointments to the wound area. - If you notice any drainage or discharge from the wound, any swelling or bruising at the site, or you develop a fever > 101? F after you are discharged home, call the office at once.  Special Instructions - You are still able to use cellular telephones; use the ear opposite the side where you have your pacemaker/defibrillator.  Avoid carrying your cellular phone near your device. - When traveling through airports, show security personnel your identification card to avoid being screened in the metal detectors.  Ask the security personnel to use the hand wand. - Avoid arc welding equipment, MRI testing (magnetic resonance imaging), TENS units (transcutaneous nerve stimulators).  Call the office for questions about other devices. - Avoid electrical appliances that are in poor condition or are not properly grounded. - Microwave ovens are safe to be near or to operate.  NO ELIQUIS UNTIL Tuesday the 11th.

## 2019-07-31 MED FILL — Fentanyl Citrate Preservative Free (PF) Inj 100 MCG/2ML: INTRAMUSCULAR | Qty: 2 | Status: AC

## 2019-07-31 MED FILL — Lidocaine HCl Local Inj 1%: INTRAMUSCULAR | Qty: 60 | Status: AC

## 2019-08-04 ENCOUNTER — Telehealth: Payer: Self-pay

## 2019-08-04 NOTE — Telephone Encounter (Signed)
I told the pt she do not have to send a transmission with her monitor everyday. She states she wanted the app on her phone but the rep did not come back so she can tell her she wanted the app on her phone. I gave the pt the number to [employer] tech support to see if her phone is compatible with the app.

## 2019-08-08 ENCOUNTER — Ambulatory Visit: Payer: Medicare Other | Attending: Internal Medicine | Admitting: Occupational Therapy

## 2019-08-08 ENCOUNTER — Encounter: Payer: Self-pay | Admitting: Occupational Therapy

## 2019-08-08 ENCOUNTER — Other Ambulatory Visit: Payer: Self-pay

## 2019-08-08 DIAGNOSIS — R41842 Visuospatial deficit: Secondary | ICD-10-CM | POA: Insufficient documentation

## 2019-08-08 DIAGNOSIS — R278 Other lack of coordination: Secondary | ICD-10-CM | POA: Insufficient documentation

## 2019-08-08 DIAGNOSIS — R414 Neurologic neglect syndrome: Secondary | ICD-10-CM | POA: Diagnosis not present

## 2019-08-08 DIAGNOSIS — I69318 Other symptoms and signs involving cognitive functions following cerebral infarction: Secondary | ICD-10-CM | POA: Insufficient documentation

## 2019-08-08 DIAGNOSIS — R41840 Attention and concentration deficit: Secondary | ICD-10-CM | POA: Insufficient documentation

## 2019-08-08 NOTE — Patient Instructions (Addendum)
   Visual Compensation Strategies:  1. Look for the edge of objects (to the left and/or right) so that you make sure you are seeing all of an object 2. Turn your head when walking, scan from side to side, particularly in busy environments 3. Use an organized scanning pattern. It's usually easier to scan from top to bottom, and left to right (like you are reading) 4. Double check yourself (have son ride with you, read aloud, double check when possible) 5. Use a line guide (like a blank piece of paper) or your finger when reading 6. If necessary, place brightly colored tape at end of table or work area as a reminder to always look until you see the tape.  7.  Have someone with you in busy/unfamiliar environments for safety (on left side) 8.  Use large print when possible   Vision Activities:  1. Word searches 2. Read out loud so someone can correct you if you miss a word 3. Jigsaw puzzle 4. Scavenger hunt--look around house for objects (something a given color, list of objects, cards placed around home, etc). 5. Look for a list of items at a grocery store (with supervision) 6. Card/board games (matching, connect 4, etc.) 7. Games on Systems analyst Activities  Perform the following activities for 15-20 minutes 1 times per day with left hand(s).   Rotate ball in fingertips (clockwise and counter-clockwise).  Toss ball in air and catch with the same hand.  Toss ball between hands.  Flip cards 1 at a time as fast as you can.  Deal cards with your thumb (Hold deck in hand and push card off top with thumb).  Shuffle cards.  Pick up coins and stack.  Pick up coins one at a time until you get 5-10 in your hand, then move coins from palm to fingertips to place in container or coin bank one at a time.  Practice writing and/or typing.

## 2019-08-08 NOTE — Therapy (Signed)
[facility] [address] [place], [place], [address] Phone: [phone]   Fax:  [phone]  Occupational Therapy Treatment  Patient Details  Name: [name] MRN: [id number] Date of Birth: 10/08/1932 Referring Provider (OT): Dr. Hosie Poisson (Dr. Glori Bickers (PCP))   Encounter Date: 08/08/2019  OT End of Session - 08/08/19 0820    Visit Number  2    Number of Visits  9    Date for OT Re-Evaluation  08/06/19   may need to modifiy due to upcoming Pacemaker surgery   Authorization Type  Medicare / BCBS    Authorization Time Period  07/07/19-10/05/19    Authorization - Visit Number  2    Authorization - Number of Visits  10    Progress Note Due on Visit  10    OT Start Time  0805    OT Stop Time  0845    OT Time Calculation (min)  40 min    Activity Tolerance  Patient tolerated treatment well    Behavior During Therapy  [facility] for tasks assessed/performed       Past Medical History:  Diagnosis Date  . Allergic rhinitis   . 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 Arthritis   . Atrial fibrillation -persistent cardiologist-  dr klein/  primary EP -- [facility] Tawanna Sat ([facility])   a. s/p PVI [name] 2010;  b. on tikosyn/coumadin;  c. 05/2009 Echo: EF 60-65%, Gr 2 DD. (first dx 09/ 2007)  . Bilateral lower extremity edema   . Bleeding hemorrhoid   . Carotid stenosis    mild (hosp 3/11)- consult by vasc/ Dr Donnetta Hutching  . Complication of anesthesia    hard to wake  . Diverticulosis of colon   . Dyspnea    on exertion-climbing stairs  . Fatty liver   . H/O cardiac radiofrequency ablation    01/ 2008 at [facility] /  03/ 2010  at [facility]  . Heart failure with preserved ejection fraction ([place])   . History of adenomatous polyp of colon    tubular adenoma's  . History of cardiomyopathy    secondary tachycardia-induced cardiomyopathy -- resolved 2014  . History of squamous cell carcinoma in situ (SCCIS) of skin    05/ 2017  nasal bridge  and right medial knee  . History of transient ischemic attack (TIA)    01-24-2005 and 06-12-2009  . Hyperlipidemia   . Hypothyroidism   . Mild intermittent asthma    reacts to cats  . Mixed stress and urge urinary incontinence   . Pulmonary nodule   . S/P AV nodal ablation 07/28/19 07/29/2019  . S/P mitral valve repair 10-23-1998  dr Boyce Medici at [facility]   for MVP and regurg. (annuloplasty ring procedure)  . S/P placement of cardiac pacemaker MDT 07/28/19 07/29/2019    Past Surgical History:  Procedure Laterality Date  . APPENDECTOMY  1978  . AV NODE ABLATION N/A 07/28/2019   Procedure: AV NODE ABLATION;  Surgeon: Deboraha Sprang, MD;  Location: [place] CV LAB;  Service: Cardiovascular;  Laterality: N/A;  . BUBBLE STUDY  06/19/2019   Procedure: BUBBLE STUDY;  Surgeon: Pixie Casino, MD;  Location: [facility] ENDOSCOPY;  Service: Cardiovascular;;  . CARDIAC ELECTROPHYSIOLOGY [place]  01/ 2008    at [place]   right-sided ablation atrial flutter  . CARDIAC ELECTROPHYSIOLOGY STUDY AND ABLATION  03/ 2010   dr Jaymes Graff at [facility] node ablation  and pulmonary vein isolation for atrial fib  . CARDIOVERSION  06-18-2006;  07-13-2006;  10-19-2010;  10-27-2010  . COLONOSCOPY    . COLONOSCOPY WITH PROPOFOL N/A 10/13/2017   Procedure: COLONOSCOPY WITH PROPOFOL;  Surgeon: [name], MD;  Location: [facility] ENDOSCOPY;  Service: Gastroenterology;  Laterality: N/A;  . CYSTO/ TRANSURETHRAL COLLAGEN INJECTION THERAPY  07-26-2007   dr Matilde Sprang  . DILATION AND CURETTAGE OF UTERUS    . ESOPHAGOGASTRODUODENOSCOPY (EGD) WITH PROPOFOL N/A 10/13/2017   Procedure: ESOPHAGOGASTRODUODENOSCOPY (EGD) WITH PROPOFOL;  Surgeon: [name], MD;  Location: [facility] ENDOSCOPY;  Service: Gastroenterology;  Laterality: N/A;  . EXCISIONAL HEMORRHOIDECTOMY  1980s  . GIVENS CAPSULE STUDY N/A 12/08/2017   Procedure: GIVENS CAPSULE STUDY;  Surgeon: [name], MD;  Location: [facility] END[facility]COPY;  Service:  Gastroenterology;  Laterality: N/A;  . HEMORRHOID SURGERY N/A 10/29/2016   Procedure: HEMORRHOIDECTOMY;  Surgeon: Leighton Ruff, MD;  Location: [facility];  Service: General;  Laterality: N/A;  . MITRAL VALVE ANNULOPLASTY  10/23/1998   "Model [id number]; Campbell Lerner [id number]"; size 33mm; [facility]; Dr. Boyce Medici  . PACEMAKER IMPLANT N/A 07/28/2019   Procedure: PACEMAKER IMPLANT;  Surgeon: Deboraha Sprang, MD;  Location: [place] CV LAB;  Service: Cardiovascular;  Laterality: N/A;  . [place]  . TEE WITH CARDIOVERSION  05-06-2006 at [facility];  01-02-2013 at [facility]  . TEE WITHOUT CARDIOVERSION N/A 06/19/2019   Procedure: TRANSESOPHAGEAL ECHOCARDIOGRAM (TEE);  Surgeon: Pixie Casino, MD;  Location: [facility] ENDOSCOPY;  Service: Cardiovascular;  Laterality: N/A;  . TOTAL HIP ARTHROPLASTY Left 05/04/2017   Procedure: LEFT TOTAL HIP ARTHROPLASTY ANTERIOR APPROACH;  Surgeon: Mcarthur Rossetti, MD;  Location: [facility];  Service: Orthopedics;  Laterality: Left;  . TRANSTHORACIC ECHOCARDIOGRAM  05-01-2015   dr Caryl Comes   ef 50-55%/  mild AV sclerosis without stenosis/  post MV repair with mild central MR (valve area by pressure half-time 2cm^2,  valve area by continutity equation 0.91cm^2, peak grandiant 59mmHg)/  severe LAE/ mild TR/ mild RAE   . TUBAL LIGATION Bilateral 1978    There were no vitals filed for this visit.  Subjective Assessment - 08/08/19 0808    Subjective   pt reports sensitivity in L shoulder    Pertinent History  CVA     (MRI showed acute infarct in posterior R MCA).  PMH:  mitral valve repair, a-fib, chronic diastolic heart failure, carotid stenosis, hypothyroidism, hyperlipidemia, TIA, HTN, pacemaker surgery scheduled 07/28/19    Limitations  s/p pacemaker surgery  07/28/19; no heavy lifting for 6 weeks    Patient Stated Goals  be able to continue to drive safely, improved use of LUE    Currently in Pain?  No/denies         [facility] OT Assessment - 08/08/19 0001       Precautions   Precautions  ICD/Pacemaker    Precaution Comments  L inattention/visual spatial deficits       Tabletop visual scanning/number cancellation sheet (1.68M size) with 100% accuracy with mod incr time.  12 piece puzzle with min difficulty/incr time due to visual perceptual deficits, but no cueing needed        OT Education - 08/08/19 0819    Education Details  Visual compensation strategies and HEP.  Coordination HEP.--see pt instructions    Person(s) Educated  Patient    Methods  Explanation;Demonstration;Verbal cues;Handout    Comprehension  Verbalized understanding;Returned demonstration          OT Long Term Goals -  08/08/19 0838      OT LONG TERM GOAL #1   Title  Pt will be independent with visual/cognitive HEP.--check LTGs 08/25/19)    Time  4    Period  Weeks    Status  New      OT LONG TERM GOAL #2   Title  Pt will be independent with coordination HEP.    Time  4    Period  Weeks    Status  New      OT LONG TERM GOAL #3   Title  Pt will perform environmental scanning in busy environment with at least 90% accuracy for incr safety in community.    Time  4    Period  Weeks    Status  New      OT LONG TERM GOAL #4   Title  Pt will be able to perform simple environmental scanning with divided attention with at least 85% accuracy for incr safety for driving/community activities.    Time  4    Period  Weeks    Status  New      OT LONG TERM GOAL #5   Title  Pt will improve coordination for ADLs as shown by improving time on 9-hole peg test by at least 7sec.    Baseline  33.93sec    Time  4    Period  Weeks    Status  New            Plan - 08/08/19 [id number]    Clinical Impression Statement  Pt demo improved tabletop visual scanning today.  Pt reports improved LUE functional use, but continues to demo sensation/proprioception deficits.    OT Occupational Profile and History  Detailed Assessment- Review of Records and additional review of  physical, cognitive, psychosocial history related to current functional performance    Occupational performance deficits (Please refer to evaluation for details):  ADL's;IADL's;Leisure;Social Participation    Body Structure / Function / Physical Skills  ADL;Vision;IADL;Sensation;Coordination;FMC;UE functional use;GMC;Proprioception    Cognitive Skills  Attention;Memory;Perception;Safety Awareness    Rehab Potential  Good    Clinical Decision Making  Several treatment options, min-mod task modification necessary    Comorbidities Affecting Occupational Performance:  May have comorbidities impacting occupational performance    Modification or Assistance to Complete Evaluation   Min-Moderate modification of tasks or assist with assess necessary to complete eval    OT Frequency  2x / week    OT Duration  4 weeks   +eval (or 8 visits over 6 weeks as schedule may be modified due to pt's upcoming Pacemaker surgery)   OT Treatment/Interventions  Self-care/ADL training;DME and/or AE instruction;Therapeutic activities;Therapeutic exercise;Cognitive remediation/compensation;Visual/perceptual remediation/compensation;Neuromuscular education;Patient/family education    Plan  tabletop/environmental scanning    Consulted and Agree with Plan of Care  Patient       Patient will benefit from skilled therapeutic intervention in order to improve the following deficits and impairments:   Body Structure / Function / Physical Skills: ADL, Vision, IADL, Sensation, Coordination, FMC, UE functional use, GMC, Proprioception Cognitive Skills: Attention, Memory, Perception, Safety Awareness     Visit Diagnosis: Visuospatial deficit  Attention and concentration deficit  Neurologic neglect syndrome  Other lack of coordination  Other symptoms and signs involving cognitive functions following cerebral infarction    Problem List Patient Active Problem List   Diagnosis Date Noted  . S/P AV nodal ablation 07/28/19  07/29/2019  . S/P placement of cardiac pacemaker MDT 07/28/19 07/29/2019  . AV block  07/28/2019  . CVA (cerebral vascular accident) ([place]) 06/16/2019  . Facial tingling 11/29/2018  . Tremor of left hand 11/29/2018  . Medicare annual wellness visit, subsequent 11/24/2018  . Dysuria 02/06/2018  . Iron deficiency anemia 10/21/2017  . Rapid atrial fibrillation ([place]) 08/19/2017  . Constipation 08/02/2017  . Numbness and tingling 07/14/2017  . Paresthesia 07/14/2017  . Unilateral primary osteoarthritis, left hip 05/04/2017  . Status post total replacement of left hip 05/04/2017  . Hip osteoarthritis 04/27/2017  . Venous stasis dermatitis of both lower extremities 01/08/2017  . Impacted cerumen of right ear 11/20/2016  . Osteopenia 10/25/2016  . Pedal edema 08/26/2016  . Varicose veins of both lower extremities 08/26/2016  . Estrogen deficiency 08/26/2016  . Screening mammogram, encounter for 08/26/2016  . Hemorrhoids 08/26/2016  . History of nonmelanoma skin cancer 01/01/2016  . Hip pain 08/02/2014  . Left knee pain 08/02/2014  . Chronic cough 05/08/2014  . Caregiver stress 08/16/2013  . Colon cancer screening 08/16/2013  . Encounter for therapeutic drug monitoring 04/20/2013  . Left ovarian cyst 03/14/2013  . (HFpEF) heart failure with preserved ejection fraction ([place]) 12/27/2012  . Palpitations 04/15/2012  . Cardiomyopathy, secondary --Resolved again 10/14 10/13/2010  . COLONIC POLYPS, ADENOMATOUS, HX OF 09/18/2009  . PULMONARY NODULE 12/20/2008  . GANGLION CYST 10/04/2007  . Hyperlipidemia 04/27/2007  . Depression with anxiety 04/27/2007  . Asthma, mild intermittent 04/27/2007  . INSOMNIA 04/27/2007  . ADENOMATOUS COLONIC POLYP 11/04/2006  . Hypothyroidism 09/02/2006    [facility] 08/08/2019, 8:38 AM  [facility] [address] [place], [place], [address] Phone: [phone]   Fax:  [phone]  Name: [name] MRN: [id number] Date of Birth: 12-01-1932   Vianne Bulls, OTR/L [facility] [address] [place], [place]  [address] [phone] phone [phone] 08/08/19 8:38 AM

## 2019-08-10 ENCOUNTER — Ambulatory Visit (INDEPENDENT_AMBULATORY_CARE_PROVIDER_SITE_OTHER): Payer: Medicare Other | Admitting: Emergency Medicine

## 2019-08-10 ENCOUNTER — Other Ambulatory Visit: Payer: Self-pay

## 2019-08-10 DIAGNOSIS — I429 Cardiomyopathy, unspecified: Secondary | ICD-10-CM | POA: Diagnosis not present

## 2019-08-10 LAB — CUP PACEART INCLINIC DEVICE CHECK
Battery Remaining Longevity: 137 mo
Battery Voltage: 3.22 V
Brady Statistic AP VP Percent: 0 %
Brady Statistic AP VS Percent: 0 %
Brady Statistic AS VP Percent: 98.65 %
Brady Statistic AS VS Percent: 1.35 %
Brady Statistic RA Percent Paced: 0 %
Brady Statistic RV Percent Paced: 98.65 %
Date Time Interrogation Session: 20210520103700
Implantable Lead Implant Date: 20210507
Implantable Lead Location: 753860
Implantable Lead Model: 5076
Implantable Pulse Generator Implant Date: 20210507
Lead Channel Impedance Value: 3306 Ohm
Lead Channel Impedance Value: 3306 Ohm
Lead Channel Impedance Value: 475 Ohm
Lead Channel Impedance Value: 570 Ohm
Lead Channel Pacing Threshold Amplitude: 0.625 V
Lead Channel Pacing Threshold Pulse Width: 0.4 ms
Lead Channel Sensing Intrinsic Amplitude: 11.25 mV
Lead Channel Setting Pacing Amplitude: 3.5 V
Lead Channel Setting Pacing Pulse Width: 0.4 ms
Lead Channel Setting Sensing Sensitivity: 4 mV

## 2019-08-10 NOTE — Progress Notes (Signed)
Wound check appointment. Steri-strips removed. Wound without redness or edema. Incision edges approximated, wound well healed. Normal device function. Thresholds, sensing, and impedances consistent with implant measurements. Device programmed at 3.5V for extra safety margin until 3 month visit. Histogram distribution appropriate for patient and level of activity. No  ventricular arrhythmias noted. S/P AV node ablation, lower rate programming changed from 90 bpm to 80 bpm  Patient educated about wound care, arm mobility, lifting restrictions, shock plan. ROV on 10/31/19 with Dr Caryl Comes. Next remote transmission 08/27/19  and every 3 months after.

## 2019-08-11 ENCOUNTER — Encounter: Payer: Self-pay | Admitting: Occupational Therapy

## 2019-08-11 ENCOUNTER — Ambulatory Visit: Payer: Medicare Other | Admitting: Occupational Therapy

## 2019-08-11 DIAGNOSIS — R41842 Visuospatial deficit: Secondary | ICD-10-CM

## 2019-08-11 DIAGNOSIS — R41840 Attention and concentration deficit: Secondary | ICD-10-CM | POA: Diagnosis not present

## 2019-08-11 DIAGNOSIS — I69318 Other symptoms and signs involving cognitive functions following cerebral infarction: Secondary | ICD-10-CM

## 2019-08-11 DIAGNOSIS — R414 Neurologic neglect syndrome: Secondary | ICD-10-CM

## 2019-08-11 DIAGNOSIS — R278 Other lack of coordination: Secondary | ICD-10-CM | POA: Diagnosis not present

## 2019-08-11 NOTE — Therapy (Signed)
[facility] [address] [place], [place], [address] Phone: [phone]   Fax:  [phone]  Occupational Therapy Treatment  Patient Details  Name: [name] MRN: [id number] Date of Birth: 09-22-1932 Referring Provider (OT): Dr. Hosie Poisson (Dr. Glori Bickers (PCP))   Encounter Date: 08/11/2019  OT End of Session - 08/11/19 0808    Visit Number  3    Number of Visits  9    Date for OT Re-Evaluation  09/08/19   modified due to pacemaker surgery   Authorization Type  Medicare / BCBS    Authorization Time Period  07/07/19-10/05/19    Authorization - Visit Number  3    Authorization - Number of Visits  10    Progress Note Due on Visit  10    OT Start Time  0805    OT Stop Time  0845    OT Time Calculation (min)  40 min    Activity Tolerance  Patient tolerated treatment well    Behavior During Therapy  [facility] for tasks assessed/performed       Past Medical History:  Diagnosis Date  . Allergic rhinitis   . 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 Arthritis   . Atrial fibrillation -persistent cardiologist-  dr klein/  primary EP -- [facility]aw[facility] Sat [facility])   a. s/p PVI [name] 2010;  b. on tikosyn/coumadin;  c. 05/2009 Echo: EF 60-65%, Gr 2 DD. (first dx 09/ 2007)  . Bilateral lower extremity edema   . Bleeding hemorrhoid   . Carotid stenosis    mild (hosp 3/11)- consult by vasc/ Dr Donnetta Hutching  . Complication of anesthesia    hard to wake  . Diverticulosis of colon   . Dyspnea    on exertion-climbing stairs  . Fatty liver   . H/O cardiac radiofrequency ablation    01/ 2008 at [facility] /  03/ 2010  at [facility]  . Heart failure with preserved ejection fraction ([place])   . History of adenomatous polyp of colon    tubular adenoma's  . History of cardiomyopathy    secondary tachycardia-induced cardiomyopathy -- resolved 2014  . History of squamous cell carcinoma in situ (SCCIS) of skin    05/ 2017  nasal bridge and right medial  knee  . History of transient ischemic attack (TIA)    01-24-2005 and 06-12-2009  . Hyperlipidemia   . Hypothyroidism   . Mild intermittent asthma    reacts to cats  . Mixed stress and urge urinary incontinence   . Pulmonary nodule   . S/P AV nodal ablation 07/28/19 07/29/2019  . S/P mitral valve repair 10-23-1998  dr Boyce Medici at [facility]   for MVP and regurg. (annuloplasty ring procedure)  . S/P placement of cardiac pacemaker MDT 07/28/19 07/29/2019    Past Surgical History:  Procedure Laterality Date  . APPENDECTOMY  1978  . AV NODE ABLATION N/A 07/28/2019   Procedure: AV NODE ABLATION;  Surgeon: Deboraha Sprang, MD;  Location: [place] CV LAB;  Service: Cardiovascular;  Laterality: N/A;  . BUBBLE STUDY  06/19/2019   Procedure: BUBBLE STUDY;  Surgeon: Pixie Casino, MD;  Location: [facility] ENDOSCOPY;  Service: Cardiovascular;;  . CARDIAC ELECTROPHYSIOLOGY [place]  01/ 2008    at [place]   right-sided ablation atrial flutter  . CARDIAC ELECTROPHYSIOLOGY STUDY AND ABLATION  03/ 2010   dr Jaymes Graff at [facility]   AV node ablation and pulmonary vein isolation  for atrial fib  . CARDIOVERSION  06-18-2006;  07-13-2006;  10-19-2010;  10-27-2010  . COLONOSCOPY    . COLONOSCOPY WITH PROPOFOL N/A 10/13/2017   Procedure: COLONOSCOPY WITH PROPOFOL;  Surgeon: [name], MD;  Location: [facility] ENDOSCOPY;  Service: Gastroenterology;  Laterality: N/A;  . CYSTO/ TRANSURETHRAL COLLAGEN INJECTION THERAPY  07-26-2007   dr Matilde Sprang  . DILATION AND CURETTAGE OF UTERUS    . ESOPHAGOGASTRODUODENOSCOPY (EGD) WITH PROPOFOL N/A 10/13/2017   Procedure: ESOPHAGOGASTRODUODENOSCOPY (EGD) WITH PROPOFOL;  Surgeon: [name], MD;  Location: [facility] ENDOSCOPY;  Service: Gastroenterology;  Laterality: N/A;  . EXCISIONAL HEMORRHOIDECTOMY  1980s  . GIVENS CAPSULE STUDY N/A 12/08/2017   Procedure: GIVENS CAPSULE STUDY;  Surgeon: [name], MD;  Location: [facility] ENDOSCOPY;  Service: Gastroenterology;   Laterality: N/A;  . HEMORRHOID SURGERY N/A 10/29/2016   Procedure: HEMORRHOIDECTOMY;  Surgeon: Leighton Ruff, MD;  Location: [facility];  Service: General;  Laterality: N/A;  . MITRAL VALVE ANNULOPLASTY  10/23/1998   "Model 4625; Campbell Lerner [id number]"; size 45mm; [facility]; Dr. Boyce Medici  . PACEMAKER IMPLANT N/A 07/28/2019   Procedure: PACEMAKER IMPLANT;  Surgeon: Deboraha Sprang, MD;  Location: [place] CV LAB;  Service: Cardiovascular;  Laterality: N/A;  . [place]  . TEE WITH CARDIOVERSION  05-06-2006 at [facility];  01-02-2013 at [facility]  . TEE WITHOUT CARDIOVERSION N/A 06/19/2019   Procedure: TRANSESOPHAGEAL ECHOCARDIOGRAM (TEE);  Surgeon: Pixie Casino, MD;  Location: [facility] ENDOSCOPY;  Service: Cardiovascular;  Laterality: N/A;  . TOTAL HIP ARTHROPLASTY Left 05/04/2017   Procedure: LEFT TOTAL HIP ARTHROPLASTY ANTERIOR APPROACH;  Surgeon: Mcarthur Rossetti, MD;  Location: [place];  Service: Orthopedics;  Laterality: Left;  . TRANSTHORACIC ECHOCARDIOGRAM  05-01-2015   dr Caryl Comes   ef 50-55%/  mild AV sclerosis without stenosis/  post MV repair with mild central MR (valve area by pressure half-time 2cm^2,  valve area by continutity equation 0.91cm^2, peak grandiant 14mmHg)/  severe LAE/ mild TR/ mild RAE   . TUBAL LIGATION Bilateral 1978    There were no vitals filed for this visit.  Subjective Assessment - 08/11/19 0807    Subjective   Pt reports that she had wound check and pacemaker adjusted yesterday (gradually slowing heartbeat).  Feels like the hands aren't talking to each other.    Pertinent History  CVA     (MRI showed acute infarct in posterior R MCA).  PMH:  mitral valve repair, a-fib, chronic diastolic heart failure, carotid stenosis, hypothyroidism, hyperlipidemia, TIA, HTN, pacemaker surgery scheduled 07/28/19    Limitations  s/p pacemaker surgery  07/28/19; no heavy lifting for 6 weeks    Patient Stated Goals  be able to continue to drive safely,  improved use of LUE    Currently in Pain?  No/denies          Using L hand to copy small peg design with min cueing to clarify directions.  Pt with min error with placement (design shifted) and min difficulty with coordination (hit bowl of pegs placed on L side x2 due to decr proprioception/coordination).  Tabletop visual scanning in expanded area with "Spot It" cards with approx 90% accuracy overall.  Horizontal word search with min difficulty/incr time, min cues for organized scan pattern initially.  Placing grooved pegs in pegboard with LUE for incr coordination/in-hand manipulation with good performance.       OT Long Term Goals - 08/08/19 [id number]      OT LONG TERM GOAL #1   Title  Pt will be independent with visual/cognitive HEP.--check LTGs 08/25/19)    Time  4    Period  Weeks    Status  New      OT LONG TERM GOAL #2   Title  Pt will be independent with coordination HEP.    Time  4    Period  Weeks    Status  New      OT LONG TERM GOAL #3   Title  Pt will perform environmental scanning in busy environment with at least 90% accuracy for incr safety in community.    Time  4    Period  Weeks    Status  New      OT LONG TERM GOAL #4   Title  Pt will be able to perform simple environmental scanning with divided attention with at least 85% accuracy for incr safety for driving/community activities.    Time  4    Period  Weeks    Status  New      OT LONG TERM GOAL #5   Title  Pt will improve coordination for ADLs as shown by improving time on 9-hole peg test by at least 7sec.    Baseline  33.93sec    Time  4    Period  Weeks    Status  New            Plan - 08/11/19 0809    Clinical Impression Statement  Pt is progressing towards goals.   Pt reports improved LUE functional use, but continues to demo sensation/proprioception deficits (knocking things with LUE--instructed pt to use vision to compensate).    OT Occupational Profile and History  Detailed Assessment-  Review of Records and additional review of physical, cognitive, psychosocial history related to current functional performance    Occupational performance deficits (Please refer to evaluation for details):  ADL's;IADL's;Leisure;Social Participation    Body Structure / Function / Physical Skills  ADL;Vision;IADL;Sensation;Coordination;FMC;UE functional use;GMC;Proprioception    Cognitive Skills  Attention;Memory;Perception;Safety Awareness    Rehab Potential  Good    Clinical Decision Making  Several treatment options, min-mod task modification necessary    Comorbidities Affecting Occupational Performance:  May have comorbidities impacting occupational performance    Modification or Assistance to Complete Evaluation   Min-Moderate modification of tasks or assist with assess necessary to complete eval    OT Frequency  2x / week    OT Duration  4 weeks   +eval (or 8 visits over 6 weeks as schedule may be modified due to pt's upcoming Pacemaker surgery)   OT Treatment/Interventions  Self-care/ADL training;DME and/or AE instruction;Therapeutic activities;Therapeutic exercise;Cognitive remediation/compensation;Visual/perceptual remediation/compensation;Neuromuscular education;Patient/family education    Plan  tabletop/environmental scanning, carrying water with ambulation, simple divided attention    Consulted and Agree with Plan of Care  Patient       Patient will benefit from skilled therapeutic intervention in order to improve the following deficits and impairments:   Body Structure / Function / Physical Skills: ADL, Vision, IADL, Sensation, Coordination, FMC, UE functional use, GMC, Proprioception Cognitive Skills: Attention, Memory, Perception, Safety Awareness     Visit Diagnosis: Visuospatial deficit  Attention and concentration deficit  Neurologic neglect syndrome  Other lack of coordination  Other symptoms and signs involving cognitive functions following cerebral  infarction    Problem List Patient Active Problem List   Diagnosis Date Noted  . S/P AV nodal ablation 07/28/19 07/29/2019  . S/P placement of cardiac pacemaker MDT 07/28/19 07/29/2019  . AV block  07/28/2019  . CVA (cerebral vascular accident) ([place]) 06/16/2019  . Facial tingling 11/29/2018  . Tremor of left hand 11/29/2018  . Medicare annual wellness visit, subsequent 11/24/2018  . Dysuria 02/06/2018  . Iron deficiency anemia 10/21/2017  . Rapid atrial fibrillation ([place]) 08/19/2017  . Constipation 08/02/2017  . Numbness and tingling 07/14/2017  . Paresthesia 07/14/2017  . Unilateral primary osteoarthritis, left hip 05/04/2017  . Status post total replacement of left hip 05/04/2017  . Hip osteoarthritis 04/27/2017  . Venous stasis dermatitis of both lower extremities 01/08/2017  . Impacted cerumen of right ear 11/20/2016  . Osteopenia 10/25/2016  . Pedal edema 08/26/2016  . Varicose veins of both lower extremities 08/26/2016  . Estrogen deficiency 08/26/2016  . Screening mammogram, encounter for 08/26/2016  . Hemorrhoids 08/26/2016  . History of nonmelanoma skin cancer 01/01/2016  . Hip pain 08/02/2014  . Left knee pain 08/02/2014  . Chronic cough 05/08/2014  . Caregiver stress 08/16/2013  . Colon cancer screening 08/16/2013  . Encounter for therapeutic drug monitoring 04/20/2013  . Left ovarian cyst 03/14/2013  . (HFpEF) heart failure with preserved ejection fraction ([place]) 12/27/2012  . Palpitations 04/15/2012  . Cardiomyopathy, secondary --Resolved again 10/14 10/13/2010  . COLONIC POLYPS, ADENOMATOUS, HX OF 09/18/2009  . PULMONARY NODULE 12/20/2008  . GANGLION CYST 10/04/2007  . Hyperlipidemia 04/27/2007  . Depression with anxiety 04/27/2007  . Asthma, mild intermittent 04/27/2007  . INSOMNIA 04/27/2007  . ADENOMATOUS COLONIC POLYP 11/04/2006  . Hypothyroidism 09/02/2006    [facility] 08/11/2019, 10:50 AM  [facility] [address] [place], [place], [address] Phone: [phone]   Fax:  [phone]  Name: [name] MRN: [id number] Date of Birth: [birth year]   Vianne Bulls, OTR/L [facility] [address] [place], [place]  [address] [phone] phone [phone] 08/11/19 10:50 AM

## 2019-08-15 ENCOUNTER — Encounter: Payer: Self-pay | Admitting: Occupational Therapy

## 2019-08-15 ENCOUNTER — Ambulatory Visit: Payer: Medicare Other | Admitting: Occupational Therapy

## 2019-08-15 ENCOUNTER — Other Ambulatory Visit: Payer: Self-pay

## 2019-08-15 DIAGNOSIS — R278 Other lack of coordination: Secondary | ICD-10-CM

## 2019-08-15 DIAGNOSIS — K6289 Other specified diseases of anus and rectum: Secondary | ICD-10-CM | POA: Diagnosis not present

## 2019-08-15 DIAGNOSIS — R41842 Visuospatial deficit: Secondary | ICD-10-CM

## 2019-08-15 DIAGNOSIS — R414 Neurologic neglect syndrome: Secondary | ICD-10-CM | POA: Diagnosis not present

## 2019-08-15 DIAGNOSIS — R41840 Attention and concentration deficit: Secondary | ICD-10-CM

## 2019-08-15 DIAGNOSIS — I69318 Other symptoms and signs involving cognitive functions following cerebral infarction: Secondary | ICD-10-CM

## 2019-08-15 NOTE — Therapy (Signed)
[facility] [address] [place], [place], [address] Phone: [phone]   Fax:  [phone]  Occupational Therapy Treatment  Patient Details  Name: [name] MRN: [id number] Date of Birth: 03/15/1933 Referring Provider (OT): Dr. Hosie Poisson (Dr. Glori Bickers (PCP))   Encounter Date: 08/15/2019  OT End of Session - 08/15/19 0806    Visit Number  4    Number of Visits  9    Date for OT Re-Evaluation  09/08/19   modified due to pacemaker surgery   Authorization Type  Medicare / BCBS    Authorization Time Period  07/07/19-10/05/19    Authorization - Visit Number  4    Authorization - Number of Visits  10    Progress Note Due on Visit  10    OT Start Time  0805    OT Stop Time  0845    OT Time Calculation (min)  40 min    Activity Tolerance  Patient tolerated treatment well    Behavior During Therapy  [facility] for tasks assessed/performed       Past Medical History:  Diagnosis Date  . Allergic rhinitis   . 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 Arthritis   . Atrial fibrillation -persistent cardiologist-  dr klein/  primary [facility]aw[facility]   a. s/p PVI [name];  b. on tikosyn/coumadin;  c. 05/2009 Echo: EF 60-65%, Gr 2 DD. (first dx 09/ 2007)  . Bilateral lower extremity edema   . Bleeding hemorrhoid   . Carotid stenosis    mild (hosp 3/11)- consult by vasc/ Dr Donnetta Hutching  . Complication of anesthesia    hard to wake  . Diverticulosis of colon   . Dyspnea    on exertion-climbing stairs  . Fatty liver   . H/O cardiac radiofrequency ablation    01/ 2008 at [facility] [place] /  03/ 2010  at [facility]  . Heart failure with preserved ejection fraction ([place])   . History of adenomatous polyp of colon    tubular adenoma's  . History of cardiomyopathy    secondary tachycardia-induced cardiomyopathy -- resolved 2014  . History of squamous cell carcinoma in situ (SCCIS) of skin    05/ 2017  nasal bridge and right medial  knee  . History of transient ischemic attack (TIA)    01-24-2005 and 06-12-2009  . Hyperlipidemia   . Hypothyroidism   . Mild intermittent asthma    reacts to cats  . Mixed stress and urge urinary incontinence   . Pulmonary nodule   . S/P AV nodal ablation 07/28/19 07/29/2019  . S/P mitral valve repair 10-23-1998  dr Boyce Medici at [facility]   for MVP and regurg. (annuloplasty ring procedure)  . S/P placement of cardiac pacemaker MDT 07/28/19 07/29/2019    Past Surgical History:  Procedure Laterality Date  . APPENDECTOMY  1978  . AV NODE ABLATION N/A 07/28/2019   Procedure: AV NODE ABLATION;  Surgeon: Deboraha Sprang, MD;  Location: [place] CV LAB;  Service: Cardiovascular;  Laterality: N/A;  . BUBBLE STUDY  06/19/2019   Procedure: BUBBLE STUDY;  Surgeon: Pixie Casino, MD;  Location: [facility] ENDOSCOPY;  Service: Cardiovascular;;  . CARDIAC ELECTROPHYSIOLOGY [place]  01/ 2008    at [place]   right-sided ablation atrial flutter  . CARDIAC ELECTROPHYSIOLOGY STUDY AND ABLATION  03/ 2010   dr Jaymes Graff at [facility]   AV node ablation and pulmonary vein isolation  for atrial fib  . CARDIOVERSION  06-18-2006;  07-13-2006;  10-19-2010;  10-27-2010  . COLONOSCOPY    . COLONOSCOPY WITH PROPOFOL N/A 10/13/2017   Procedure: COLONOSCOPY WITH PROPOFOL;  Surgeon: [name], MD;  Location: [facility] ENDOSCOPY;  Service: Gastroenterology;  Laterality: N/A;  . CYSTO/ TRANSURETHRAL COLLAGEN INJECTION THERAPY  07-26-2007   dr Matilde Sprang  . DILATION AND CURETTAGE OF UTERUS    . ESOPHAGOGASTRODUODENOSCOPY (EGD) WITH PROPOFOL N/A 10/13/2017   Procedure: ESOPHAGOGASTRODUODENOSCOPY (EGD) WITH PROPOFOL;  Surgeon: [name], MD;  Location: [facility] ENDOSCOPY;  Service: Gastroenterology;  Laterality: N/A;  . EXCISIONAL HEMORRHOIDECTOMY  1980s  . GIVENS CAPSULE STUDY N/A 12/08/2017   Procedure: GIVENS CAPSULE STUDY;  Surgeon: [name], MD;  Location: [facility] ENDOSCOPY;  Service: Gastroenterology;   Laterality: N/A;  . HEMORRHOID SURGERY N/A 10/29/2016   Procedure: HEMORRHOIDECTOMY;  Surgeon: Leighton Ruff, MD;  Location: [facility];  Service: General;  Laterality: N/A;  . MITRAL VALVE ANNULOPLASTY  10/23/1998   "Model 4625; Campbell Lerner [id number]"; [facility] 90[facility] . PACEMAKER IMPLANT N/A 07/28/2019   Procedure: PACEMAKER IMPLANT;  Surgeon: KDeboraha Sprang MD;  [place] [place];  Service: Cardiovascular;  Laterality: N/A;  . [place] . [facility] . TEE WITHOUT CARDIOVERSION N/A 06/19/2019   Procedure: TRANSESOPHAGEAL ECHOCARDIOGRAM (TEE);  Surgeon: HPixie Casino MD;  [facility];  Service: Cardiovascular;  Laterality: N/A;  . TOTAL HIP ARTHROPLASTY Left 05/04/2017   Procedure: LEFT TOTAL HIP ARTHROPLASTY ANTERIOR APPROACH;  Surgeon: BMcarthur Rossetti MD;  [place]  Service: Orthopedics;  Laterality: Left;  . TRANSTHORACIC ECHOCARDIOGRAM  05-01-2015   dr kCaryl Comes  ef 50-55%/  mild AV sclerosis without stenosis/  post MV repair with mild central MR (valve area by pressure half-time 2cm^2,  valve area by continutity equation 0.91cm^2, peak grandiant 847mg)/  severe LAE/ mild TR/ mild RAE   . TUBAL LIGATION Bilateral 1978    There were no vitals filed for this visit.  Subjective Assessment - 08/15/19 0805    Subjective   Pt reports that she definately has to think when using LUE.  Pt reports that she was working in flowers and replanting.    Pertinent History  CVA     (MRI showed acute infarct in posterior R MCA).  PMH:  mitral valve repair, a-fib, chronic diastolic heart failure, carotid stenosis, hypothyroidism, hyperlipidemia, TIA, HTN, pacemaker surgery scheduled 07/28/19    Limitations  s/p pacemaker surgery  07/28/19; no heavy lifting for 6 weeks    Patient Stated Goals  be able to continue to drive safely, improved use of LUE    Currently in  Pain?  No/denies        Completing [employer] Pegboard with BUE simultaneously for bilateral coordination with good accuracy and coordination.  Environmental scanning in busy environment to locate items with 100% accuracy.  Completing 48-piece puzzle with incr time, min-mod difficulty, min v.c. for problem-solving/visual perceptual skills.       OT Long Term Goals - 08/15/19 1250      OT LONG TERM GOAL #1   Title  Pt will be independent with visual/cognitive HEP.--check LTGs 08/25/19)    Time  4    Period  Weeks    Status  New      OT LONG TERM GOAL #2   Title  Pt will be independent with coordination HEP.  Time  4    Period  Weeks    Status  New      OT LONG TERM GOAL #3   Title  Pt will perform environmental scanning in busy environment with at least 90% accuracy for incr safety in community.    Time  4    Period  Weeks    Status  Achieved   08/15/19:  100% accuracy     OT LONG TERM GOAL #4   Title  Pt will be able to perform simple environmental scanning with divided attention with at least 85% accuracy for incr safety for driving/community activities.    Time  4    Period  Weeks    Status  New      OT LONG TERM GOAL #5   Title  Pt will improve coordination for ADLs as shown by improving time on 9-hole peg test by at least 7sec.    Baseline  33.93sec    Time  4    Period  Weeks    Status  New            Plan - 08/15/19 0806    Clinical Impression Statement  Pt is progressing towards goa (#3 met)l.  Pt demo improved environmental scanning.    OT Occupational Profile and History  Detailed Assessment- Review of Records and additional review of physical, cognitive, psychosocial history related to current functional performance    Occupational performance deficits (Please refer to evaluation for details):  ADL's;IADL's;Leisure;Social Participation    Body Structure / Function / Physical Skills  ADL;Vision;IADL;Sensation;Coordination;FMC;UE functional  use;GMC;Proprioception    Cognitive Skills  Attention;Memory;Perception;Safety Awareness    Rehab Potential  Good    Clinical Decision Making  Several treatment options, min-mod task modification necessary    Comorbidities Affecting Occupational Performance:  May have comorbidities impacting occupational performance    Modification or Assistance to Complete Evaluation   Min-Moderate modification of tasks or assist with assess necessary to complete eval    OT Frequency  2x / week    OT Duration  4 weeks   +eval (or 8 visits over 6 weeks as schedule may be modified due to pt's upcoming Pacemaker surgery)   OT Treatment/Interventions  Self-care/ADL training;DME and/or AE instruction;Therapeutic activities;Therapeutic exercise;Cognitive remediation/compensation;Visual/perceptual remediation/compensation;Neuromuscular education;Patient/family education    Plan  complex tabletop/environmental scanning, carrying water with ambulation, simple divided attention    Consulted and Agree with Plan of Care  Patient       Patient will benefit from skilled therapeutic intervention in order to improve the following deficits and impairments:   Body Structure / Function / Physical Skills: ADL, Vision, IADL, Sensation, Coordination, FMC, UE functional use, GMC, Proprioception Cognitive Skills: Attention, Memory, Perception, Safety Awareness     Visit Diagnosis: Visuospatial deficit  Attention and concentration deficit  Neurologic neglect syndrome  Other lack of coordination  Other symptoms and signs involving cognitive functions following cerebral infarction    Problem List Patient Active Problem List   Diagnosis Date Noted  . S/P AV nodal ablation 07/28/19 07/29/2019  . S/P placement of cardiac pacemaker MDT 07/28/19 07/29/2019  . AV block 07/28/2019  . CVA (cerebral vascular accident) ([place]) 06/16/2019  . Facial tingling 11/29/2018  . Tremor of left hand 11/29/2018  . Medicare annual wellness  visit, subsequent 11/24/2018  . Dysuria 02/06/2018  . Iron deficiency anemia 10/21/2017  . Rapid atrial fibrillation ([place]) 08/19/2017  . Constipation 08/02/2017  . Numbness and tingling 07/14/2017  . Paresthesia 07/14/2017  .  Unilateral primary osteoarthritis, left hip 05/04/2017  . Status post total replacement of left hip 05/04/2017  . Hip osteoarthritis 04/27/2017  . Venous stasis dermatitis of both lower extremities 01/08/2017  . Impacted cerumen of right ear 11/20/2016  . Osteopenia 10/25/2016  . Pedal edema 08/26/2016  . Varicose veins of both lower extremities 08/26/2016  . Estrogen deficiency 08/26/2016  . Screening mammogram, encounter for 08/26/2016  . Hemorrhoids 08/26/2016  . History of nonmelanoma skin cancer 01/01/2016  . Hip pain 08/02/2014  . Left knee pain 08/02/2014  . Chronic cough 05/08/2014  . Caregiver stress 08/16/2013  . Colon cancer screening 08/16/2013  . Encounter for therapeutic drug monitoring 04/20/2013  . Left ovarian cyst 03/14/2013  . (HFpEF) heart failure with preserved ejection fraction ([place]) 12/27/2012  . Palpitations 04/15/2012  . Cardiomyopathy, secondary --Resolved again 10/14 10/13/2010  . COLONIC POLYPS, ADENOMATOUS, HX OF 09/18/2009  . PULMONARY NODULE 12/20/2008  . GANGLION CYST 10/04/2007  . Hyperlipidemia 04/27/2007  . Depression with anxiety 04/27/2007  . Asthma, mild intermittent 04/27/2007  . INSOMNIA 04/27/2007  . ADENOMATOUS COLONIC POLYP 11/04/2006  . Hypothyroidism 09/02/2006    [facility] 08/15/2019, 2:33 PM  [place] [address] [place], [place], [address] Phone: [phone]   Fax:  [phone]  Name: [name] MRN: [id number] Date of Birth: 1933/02/26   Vianne Bulls, OTR/L [facility] [address] [place], [place]  [address] [phone] phone [phone] 08/15/19 2:33 PM

## 2019-08-22 ENCOUNTER — Ambulatory Visit: Payer: Medicare Other | Attending: Internal Medicine | Admitting: Occupational Therapy

## 2019-08-22 ENCOUNTER — Encounter: Payer: Self-pay | Admitting: Occupational Therapy

## 2019-08-22 ENCOUNTER — Other Ambulatory Visit: Payer: Self-pay

## 2019-08-22 DIAGNOSIS — R414 Neurologic neglect syndrome: Secondary | ICD-10-CM | POA: Insufficient documentation

## 2019-08-22 DIAGNOSIS — I69318 Other symptoms and signs involving cognitive functions following cerebral infarction: Secondary | ICD-10-CM | POA: Diagnosis present

## 2019-08-22 DIAGNOSIS — R278 Other lack of coordination: Secondary | ICD-10-CM | POA: Diagnosis present

## 2019-08-22 DIAGNOSIS — R41842 Visuospatial deficit: Secondary | ICD-10-CM | POA: Insufficient documentation

## 2019-08-22 DIAGNOSIS — R41840 Attention and concentration deficit: Secondary | ICD-10-CM | POA: Insufficient documentation

## 2019-08-22 MED ORDER — EZETIMIBE 10 MG PO TABS: 10.0000 mg | ORAL_TABLET | Freq: Every day | ORAL | 3 refills | Status: DC

## 2019-08-22 MED ORDER — ASPIRIN EC 81 MG PO TBEC: 81.0000 mg | DELAYED_RELEASE_TABLET | Freq: Every day | ORAL | 3 refills | Status: DC

## 2019-08-22 NOTE — Telephone Encounter (Signed)
Pt's medications were sent to pt's pharmacy as requested. Confirmation received.  

## 2019-08-22 NOTE — Therapy (Signed)
[facility] [address] [place], [place], [address] Phone: [phone]   Fax:  [phone]  Occupational Therapy Treatment  Patient Details  Name: [name] MRN: [id number] Date of Birth: 1933/02/02 Referring Provider (OT): Dr. Hosie Poisson (Dr. Glori Bickers (PCP))   Encounter Date: 08/22/2019  OT End of Session - 08/22/19 0937    Visit Number  5    Number of Visits  9    Date for OT Re-Evaluation  09/08/19   modified due to pacemaker surgery   Authorization Type  Medicare / BCBS    Authorization Time Period  07/07/19-10/05/19    Authorization - Visit Number  5    Authorization - Number of Visits  10    Progress Note Due on Visit  10    OT Start Time  [id number]    OT Stop Time  1015    OT Time Calculation (min)  39 min    Activity Tolerance  Patient tolerated treatment well    Behavior During Therapy  [facility] for tasks assessed/performed       Past Medical History:  Diagnosis Date  . Allergic rhinitis   . 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 Arthritis   . Atrial fibrillation -persistent cardiologist-  dr klein/  primary EP -- [facility] Tawanna Sat ([facility])   a. s/p PVI [name] 2010;  b. on tikosyn/coumadin;  c. 05/2009 Echo: EF 60-65%, Gr 2 DD. (first dx 09/ 2007)  . Bilateral lower extremity edema   . Bleeding hemorrhoid   . Carotid stenosis    mild (hosp 3/11)- consult by vasc/ Dr Donnetta Hutching  . Complication of anesthesia    hard to wake  . Diverticulosis of colon   . Dyspnea    on exertion-climbing stairs  . Fatty liver   . H/O cardiac radiofrequency ablation    01/ 2008 at [facility] /  03/ 2010  at [facility]  . Heart failure with preserved ejection fraction ([place])   . History of adenomatous polyp of colon    tubular adenoma's  . History of cardiomyopathy    secondary tachycardia-induced cardiomyopathy -- resolved 2014  . History of squamous cell carcinoma in situ (SCCIS) of skin    05/ 2017  nasal bridge and right medial  knee  . History of transient ischemic attack (TIA)    01-24-2005 and 06-12-2009  . Hyperlipidemia   . Hypothyroidism   . Mild intermittent asthma    reacts to cats  . Mixed stress and urge urinary incontinence   . Pulmonary nodule   . S/P AV nodal ablation 07/28/19 07/29/2019  . S/P mitral valve repair 10-23-1998  dr Boyce Medici at [name]   for MVP and regurg. (annuloplasty ring procedure)  . S/P placement of cardiac pacemaker MDT 07/28/19 07/29/2019    Past Surgical History:  Procedure Laterality Date  . APPENDECTOMY  1978  . AV NODE ABLATION N/A 07/28/2019   Procedure: AV NODE ABLATION;  Surgeon: Deboraha Sprang, MD;  Location: [facility] CV LAB;  Service: Cardiovascular;  Laterality: N/A;  . BUBBLE STUDY  06/19/2019   Procedure: BUBBLE STUDY;  Surgeon: Pixie Casino, MD;  Location: [facility] ENDOSCOPY;  Service: Cardiovascular;;  . CARDIAC ELECTROPHYSIOLOGY [place]  01/ 2008    at [place]   right-sided ablation atrial flutter  . CARDIAC ELECTROPHYSIOLOGY STUDY AND ABLATION  03/ 2010   dr Jaymes Graff at [facility]   AV node ablation and pulmonary vein isolation  for atrial fib  . CARDIOVERSION  06-18-2006;  07-13-2006;  10-19-2010;  10-27-2010  . COLONOSCOPY    . COLONOSCOPY WITH PROPOFOL N/A 10/13/2017   Procedure: COLONOSCOPY WITH PROPOFOL;  Surgeon: [name], MD;  Location: [facility] ENDOSCOPY;  Service: Gastroenterology;  Laterality: N/A;  . CYSTO/ TRANSURETHRAL COLLAGEN INJECTION THERAPY  07-26-2007   dr Matilde Sprang  . DILATION AND CURETTAGE OF UTERUS    . ESOPHAGOGASTRODUODENOSCOPY (EGD) WITH PROPOFOL N/A 10/13/2017   Procedure: ESOPHAGOGASTRODUODENOSCOPY (EGD) WITH PROPOFOL;  Surgeon: [name], MD;  Location: [facility] ENDOSCOPY;  Service: Gastroenterology;  Laterality: N/A;  . EXCISIONAL HEMORRHOIDECTOMY  1980s  . GIVENS CAPSULE STUDY N/A 12/08/2017   Procedure: GIVENS CAPSULE STUDY;  Surgeon: [name], MD;  Location: [facility] ENDOSCOPY;  Service: Gastroenterology;   Laterality: N/A;  . HEMORRHOID SURGERY N/A 10/29/2016   Procedure: HEMORRHOIDECTOMY;  Surgeon: Leighton Ruff, MD;  Location: [facility];  Service: General;  Laterality: N/A;  . MITRAL VALVE ANNULOPLASTY  10/23/1998   "Model [id number]; Campbell Lerner [id number]"; size 58mm; [facility]; Dr. Boyce Medici  . PACEMAKER IMPLANT N/A 07/28/2019   Procedure: PACEMAKER IMPLANT;  Surgeon: Deboraha Sprang, MD;  Location: [facility] CV LAB;  Service: Cardiovascular;  Laterality: N/A;  . [place]  . TEE WITH CARDIOVERSION  05-06-2006 at [facility];  01-02-2013 at [facility]  . TEE WITHOUT CARDIOVERSION N/A 06/19/2019   Procedure: TRANSESOPHAGEAL ECHOCARDIOGRAM (TEE);  Surgeon: Pixie Casino, MD;  Location: [facility] ENDOSCOPY;  Service: Cardiovascular;  Laterality: N/A;  . TOTAL HIP ARTHROPLASTY Left 05/04/2017   Procedure: LEFT TOTAL HIP ARTHROPLASTY ANTERIOR APPROACH;  Surgeon: Mcarthur Rossetti, MD;  Location: [facility];  Service: Orthopedics;  Laterality: Left;  . TRANSTHORACIC ECHOCARDIOGRAM  05-01-2015   dr Caryl Comes   ef 50-55%/  mild AV sclerosis without stenosis/  post MV repair with mild central MR (valve area by pressure half-time 2cm^2,  valve area by continutity equation 0.91cm^2, peak grandiant 6mmHg)/  severe LAE/ mild TR/ mild RAE   . TUBAL LIGATION Bilateral 1978    There were no vitals filed for this visit.  Subjective Assessment - 08/22/19 0937    Subjective   Pt reports that she definately has to think when using LUE.  Pt reports that she was working in flowers and replanting.    Pertinent History  CVA     (MRI showed acute infarct in posterior R MCA).  PMH:  mitral valve repair, a-fib, chronic diastolic heart failure, carotid stenosis, hypothyroidism, hyperlipidemia, TIA, HTN, pacemaker surgery scheduled 07/28/19    Limitations  s/p pacemaker surgery  07/28/19; no heavy lifting for 6 weeks    Patient Stated Goals  be able to continue to drive safely, improved use of LUE    Currently in  Pain?  No/denies        Environmental scanning in busy, dynamic environment to locate items with 100% accuracy while carrying plate/cup in each hand for simple divided attention.  Completing 48-piece puzzle with incr time, min difficulty for problem-solving/visual perceptual skills.  Discussed difficulty with reading (due to attention).  Recommended pt begin with familiar book that she has read previously.  also recommended that pt begin art.  Discussed emotional/behavior and cognitive changes per pt questions.       OT Long Term Goals - 08/22/19 0939      OT LONG TERM GOAL #1   Title  Pt will be independent with visual/cognitive HEP.--check LTGs 08/25/19)    Time  4    Period  Weeks    Status  New      OT LONG TERM GOAL #2   Title  Pt will be independent with coordination HEP.    Time  4    Period  Weeks    Status  New      OT LONG TERM GOAL #3   Title  Pt will perform environmental scanning in busy environment with at least 90% accuracy for incr safety in community.    Time  4    Period  Weeks    Status  Achieved   08/15/19:  100% accuracy     OT LONG TERM GOAL #4   Title  Pt will be able to perform simple environmental scanning with divided attention with at least 85% accuracy for incr safety for driving/community activities.    Time  4    Period  Weeks    Status  New      OT LONG TERM GOAL #5   Title  Pt will improve coordination for ADLs as shown by improving time on 9-hole peg test by at least 7sec.    Baseline  33.93sec    Time  4    Period  Weeks    Status  New            Plan - 08/22/19 [id number]    Clinical Impression Statement  Pt is progressing towards goals with improved environmental scanning and attention.    OT Occupational Profile and History  Detailed Assessment- Review of Records and additional review of physical, cognitive, psychosocial history related to current functional performance    Occupational performance deficits (Please refer to  evaluation for details):  ADL's;IADL's;Leisure;Social Participation    Body Structure / Function / Physical Skills  ADL;Vision;IADL;Sensation;Coordination;FMC;UE functional use;GMC;Proprioception    Cognitive Skills  Attention;Memory;Perception;Safety Awareness    Rehab Potential  Good    Clinical Decision Making  Several treatment options, min-mod task modification necessary    Comorbidities Affecting Occupational Performance:  May have comorbidities impacting occupational performance    Modification or Assistance to Complete Evaluation   Min-Moderate modification of tasks or assist with assess necessary to complete eval    OT Frequency  2x / week    OT Duration  4 weeks   +eval (or 8 visits over 6 weeks as schedule may be modified due to pt's upcoming Pacemaker surgery)   OT Treatment/Interventions  Self-care/ADL training;DME and/or AE instruction;Therapeutic activities;Therapeutic exercise;Cognitive remediation/compensation;Visual/perceptual remediation/compensation;Neuromuscular education;Patient/family education    Plan  complex environmental scanning, simple divided attention    Consulted and Agree with Plan of Care  Patient       Patient will benefit from skilled therapeutic intervention in order to improve the following deficits and impairments:   Body Structure / Function / Physical Skills: ADL, Vision, IADL, Sensation, Coordination, FMC, UE functional use, GMC, Proprioception Cognitive Skills: Attention, Memory, Perception, Safety Awareness     Visit Diagnosis: Visuospatial deficit  Attention and concentration deficit  Neurologic neglect syndrome  Other lack of coordination  Other symptoms and signs involving cognitive functions following cerebral infarction    Problem List Patient Active Problem List   Diagnosis Date Noted  . S/P AV nodal ablation 07/28/19 07/29/2019  . S/P placement of cardiac pacemaker MDT 07/28/19 07/29/2019  . AV block 07/28/2019  . CVA (cerebral  vascular accident) ([place]) 06/16/2019  . Facial tingling 11/29/2018  . Tremor of left hand 11/29/2018  . Medicare annual wellness visit, subsequent 11/24/2018  . Dysuria 02/06/2018  . Iron  deficiency anemia 10/21/2017  . Rapid atrial fibrillation ([place]) 08/19/2017  . Constipation 08/02/2017  . Numbness and tingling 07/14/2017  . Paresthesia 07/14/2017  . Unilateral primary osteoarthritis, left hip 05/04/2017  . Status post total replacement of left hip 05/04/2017  . Hip osteoarthritis 04/27/2017  . Venous stasis dermatitis of both lower extremities 01/08/2017  . Impacted cerumen of right ear 11/20/2016  . Osteopenia 10/25/2016  . Pedal edema 08/26/2016  . Varicose veins of both lower extremities 08/26/2016  . Estrogen deficiency 08/26/2016  . Screening mammogram, encounter for 08/26/2016  . Hemorrhoids 08/26/2016  . History of nonmelanoma skin cancer 01/01/2016  . Hip pain 08/02/2014  . Left knee pain 08/02/2014  . Chronic cough 05/08/2014  . Caregiver stress 08/16/2013  . Colon cancer screening 08/16/2013  . Encounter for therapeutic drug monitoring 04/20/2013  . Left ovarian cyst 03/14/2013  . (HFpEF) heart failure with preserved ejection fraction ([place]) 12/27/2012  . Palpitations 04/15/2012  . Cardiomyopathy, secondary --Resolved again 10/14 10/13/2010  . COLONIC POLYPS, ADENOMATOUS, HX OF 09/18/2009  . PULMONARY NODULE 12/20/2008  . GANGLION CYST 10/04/2007  . Hyperlipidemia 04/27/2007  . Depression with anxiety 04/27/2007  . Asthma, mild intermittent 04/27/2007  . INSOMNIA 04/27/2007  . ADENOMATOUS COLONIC POLYP 11/04/2006  . Hypothyroidism 09/02/2006    [facility] 08/22/2019, 12:32 PM  [facility] [address] [place], [place], [address] Phone: [phone]   Fax:  [phone]  Name: [name] MRN: [id number] Date of Birth: 1932/08/10   Vianne Bulls, OTR/L [facility] [address] [place], [place]  [address] [phone] phone [phone] 08/22/19 12:32 PM

## 2019-08-25 ENCOUNTER — Other Ambulatory Visit: Payer: Self-pay

## 2019-08-25 ENCOUNTER — Ambulatory Visit: Payer: Medicare Other | Admitting: Occupational Therapy

## 2019-08-25 ENCOUNTER — Encounter: Payer: Self-pay | Admitting: Occupational Therapy

## 2019-08-25 DIAGNOSIS — I69318 Other symptoms and signs involving cognitive functions following cerebral infarction: Secondary | ICD-10-CM

## 2019-08-25 DIAGNOSIS — R41840 Attention and concentration deficit: Secondary | ICD-10-CM

## 2019-08-25 DIAGNOSIS — R278 Other lack of coordination: Secondary | ICD-10-CM

## 2019-08-25 DIAGNOSIS — R41842 Visuospatial deficit: Secondary | ICD-10-CM | POA: Diagnosis not present

## 2019-08-25 DIAGNOSIS — R414 Neurologic neglect syndrome: Secondary | ICD-10-CM

## 2019-08-25 NOTE — Therapy (Signed)
[facility] [address] [place], [place], [address] Phone: [phone]   Fax:  [phone]  Occupational Therapy Treatment  Patient Details  Name: [name] MRN: [id number] Date of Birth: 1932/06/25 Referring Provider (OT): Dr. Hosie Poisson (Dr. Glori Bickers (PCP))   Encounter Date: 08/25/2019  OT End of Session - 08/25/19 0859    Visit Number  6    Number of Visits  9    Date for OT Re-Evaluation  09/08/19   modified due to pacemaker surgery   Authorization Type  Medicare / BCBS    Authorization Time Period  07/07/19-10/05/19    Authorization - Visit Number  6    Authorization - Number of Visits  10    Progress Note Due on Visit  10    OT Start Time  0848    OT Stop Time  0934    OT Time Calculation (min)  46 min    Activity Tolerance  Patient tolerated treatment well    Behavior During Therapy  [facility] for tasks assessed/performed       Past Medical History:  Diagnosis Date  . Allergic rhinitis   . 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 Arthritis   . Atrial fibrillation -persistent cardiologist-  dr klein/  primary EP -- [facility] Tawanna Sat ([facility])   a. s/p PVI [name] 2010;  b. on tikosyn/coumadin;  c. 05/2009 Echo: EF 60-65%, Gr 2 DD. (first dx 09/ 2007)  . Bilateral lower extremity edema   . Bleeding hemorrhoid   . Carotid stenosis    mild (hosp 3/11)- consult by vasc/ Dr Donnetta Hutching  . Complication of anesthesia    hard to wake  . Diverticulosis of colon   . Dyspnea    on exertion-climbing stairs  . Fatty liver   . H/O cardiac radiofrequency ablation    01/ 2008 at [facility] /  03/ 2010  at [facility]  . Heart failure with preserved ejection fraction ([place])   . History of adenomatous polyp of colon    tubular adenoma's  . History of cardiomyopathy    secondary tachycardia-induced cardiomyopathy -- resolved 2014  . History of squamous cell carcinoma in situ (SCCIS) of skin    05/ 2017  nasal bridge and right medial  knee  . History of transient ischemic attack (TIA)    01-24-2005 and 06-12-2009  . Hyperlipidemia   . Hypothyroidism   . Mild intermittent asthma    reacts to cats  . Mixed stress and urge urinary incontinence   . Pulmonary nodule   . S/P AV nodal ablation 07/28/19 07/29/2019  . S/P mitral valve repair 10-23-1998  dr Boyce Medici at [facility]   for MVP and regurg. (annuloplasty ring procedure)  . S/P placement of cardiac pacemaker MDT 07/28/19 07/29/2019    Past Surgical History:  Procedure Laterality Date  . APPENDECTOMY  1978  . AV NODE ABLATION N/A 07/28/2019   Procedure: AV NODE ABLATION;  Surgeon: Deboraha Sprang, MD;  Location: [facility] CV LAB;  Service: Cardiovascular;  Laterality: N/A;  . BUBBLE STUDY  06/19/2019   Procedure: BUBBLE STUDY;  Surgeon: Pixie Casino, MD;  Location: [facility] ENDOSCOPY;  Service: Cardiovascular;;  . CARDIAC ELECTROPHYSIOLOGY [place]  01/ 2008    at [place]   right-sided ablation atrial flutter  . CARDIAC ELECTROPHYSIOLOGY STUDY AND ABLATION  03/ 2010   dr Jaymes Graff at [facility]   AV node ablation and pulmonary vein isolation  for atrial fib  . CARDIOVERSION  06-18-2006;  07-13-2006;  10-19-2010;  10-27-2010  . COLONOSCOPY    . COLONOSCOPY WITH PROPOFOL N/A 10/13/2017   Procedure: COLONOSCOPY WITH PROPOFOL;  Surgeon: [name], MD;  Location: [facility] ENDOSCOPY;  Service: Gastroenterology;  Laterality: N/A;  . CYSTO/ TRANSURETHRAL COLLAGEN INJECTION THERAPY  07-26-2007   dr Matilde Sprang  . DILATION AND CURETTAGE OF UTERUS    . ESOPHAGOGASTRODUODENOSCOPY (EGD) WITH PROPOFOL N/A 10/13/2017   Procedure: ESOPHAGOGASTRODUODENOSCOPY (EGD) WITH PROPOFOL;  Surgeon: [name], MD;  Location: [facility] ENDOSCOPY;  Service: Gastroenterology;  Laterality: N/A;  . EXCISIONAL HEMORRHOIDECTOMY  1980s  . GIVENS CAPSULE STUDY N/A 12/08/2017   Procedure: GIVENS CAPSULE STUDY;  Surgeon: [name], MD;  Location: [facility] ENDOSCOPY;  Service: Gastroenterology;   Laterality: N/A;  . HEMORRHOID SURGERY N/A 10/29/2016   Procedure: HEMORRHOIDECTOMY;  Surgeon: Leighton Ruff, MD;  Location: [facility];  Service: General;  Laterality: N/A;  . MITRAL VALVE ANNULOPLASTY  10/23/1998   "Model 4625; [employer] [id number]"; size 62mm; [facility]; Dr. Boyce Medici  . PACEMAKER IMPLANT N/A 07/28/2019   Procedure: PACEMAKER IMPLANT;  Surgeon: Deboraha Sprang, MD;  Location: [place] CV LAB;  Service: Cardiovascular;  Laterality: N/A;  . [place]  . TEE WITH CARDIOVERSION  05-06-2006 at [facility];  01-02-2013 at [facility]  . TEE WITHOUT CARDIOVERSION N/A 06/19/2019   Procedure: TRANSESOPHAGEAL ECHOCARDIOGRAM (TEE);  Surgeon: Pixie Casino, MD;  Location: [facility] ENDOSCOPY;  Service: Cardiovascular;  Laterality: N/A;  . TOTAL HIP ARTHROPLASTY Left 05/04/2017   Procedure: LEFT TOTAL HIP ARTHROPLASTY ANTERIOR APPROACH;  Surgeon: Mcarthur Rossetti, MD;  Location: [facility];  Service: Orthopedics;  Laterality: Left;  . TRANSTHORACIC ECHOCARDIOGRAM  05-01-2015   dr Caryl Comes   ef 50-55%/  mild AV sclerosis without stenosis/  post MV repair with mild central MR (valve area by pressure half-time 2cm^2,  valve area by continutity equation 0.91cm^2, peak grandiant 29mmHg)/  severe LAE/ mild TR/ mild RAE   . TUBAL LIGATION Bilateral 1978    There were no vitals filed for this visit.  Subjective Assessment - 08/25/19 0858    Pertinent History  CVA     (MRI showed acute infarct in posterior R MCA).  PMH:  mitral valve repair, a-fib, chronic diastolic heart failure, carotid stenosis, hypothyroidism, hyperlipidemia, TIA, HTN, pacemaker surgery scheduled 07/28/19    Limitations  s/p pacemaker surgery  07/28/19; no heavy lifting for 6 weeks    Patient Stated Goals  be able to continue to drive safely, improved use of LUE    Currently in Pain?  No/denies         Divided attention:  Ambulating while performing category generation (mod difficulty) while tossing scarf  between hands and difficulty continuing with physical task while thinking.  Complex environmental scanning in busy environment to locate items in sequential order with 90% accuracy (passed 2/20 items initially/ needed cueing).    Discussed progress, reviewed recommendations regarding reading (familiar, light novel), initiate art, try new online games to work on cognition.  Reviewed that cognition can improve with challenge and discussed cognitive compensation strategies.  Constant therapy: Symbol matching:  Complex/level 10) with 92% accuracy, 48.13sec average response time. Alternating symbol matching:  Complex/ level 8 with 98% accuracy, and 82.91sec average response time.       OT Long Term Goals - 08/22/19 0939      OT LONG TERM GOAL #1   Title  Pt will be independent with visual/cognitive HEP.--check  LTGs 08/25/19)    Time  4    Period  Weeks    Status  New      OT LONG TERM GOAL #2   Title  Pt will be independent with coordination HEP.    Time  4    Period  Weeks    Status  New      OT LONG TERM GOAL #3   Title  Pt will perform environmental scanning in busy environment with at least 90% accuracy for incr safety in community.    Time  4    Period  Weeks    Status  Achieved   08/15/19:  100% accuracy     OT LONG TERM GOAL #4   Title  Pt will be able to perform simple environmental scanning with divided attention with at least 85% accuracy for incr safety for driving/community activities.    Time  4    Period  Weeks    Status  New      OT LONG TERM GOAL #5   Title  Pt will improve coordination for ADLs as shown by improving time on 9-hole peg test by at least 7sec.    Baseline  33.93sec    Time  4    Period  Weeks    Status  New            Plan - 08/25/19 0859    Clinical Impression Statement  Pt is progressing towards goals with improved visual scanning and divided attention.  However, noted incr difficulty with category generation which may be due to mild  aphasia?    OT Occupational Profile and History  Detailed Assessment- Review of Records and additional review of physical, cognitive, psychosocial history related to current functional performance    Occupational performance deficits (Please refer to evaluation for details):  ADL's;IADL's;Leisure;Social Participation    Body Structure / Function / Physical Skills  ADL;Vision;IADL;Sensation;Coordination;FMC;UE functional use;GMC;Proprioception    Cognitive Skills  Attention;Memory;Perception;Safety Awareness    Rehab Potential  Good    Clinical Decision Making  Several treatment options, min-mod task modification necessary    Comorbidities Affecting Occupational Performance:  May have comorbidities impacting occupational performance    Modification or Assistance to Complete Evaluation   Min-Moderate modification of tasks or assist with assess necessary to complete eval    OT Frequency  2x / week    OT Duration  4 weeks   +eval (or 8 visits over 6 weeks as schedule may be modified due to pt's upcoming Pacemaker surgery)   OT Treatment/Interventions  Self-care/ADL training;DME and/or AE instruction;Therapeutic activities;Therapeutic exercise;Cognitive remediation/compensation;Visual/perceptual remediation/compensation;Neuromuscular education;Patient/family education    Plan  complex environmental scanning, simple divided attention (try with numbers); begin checking goal with anticipated d/c next week    Consulted and Agree with Plan of Care  Patient       Patient will benefit from skilled therapeutic intervention in order to improve the following deficits and impairments:   Body Structure / Function / Physical Skills: ADL, Vision, IADL, Sensation, Coordination, FMC, UE functional use, GMC, Proprioception Cognitive Skills: Attention, Memory, Perception, Safety Awareness     Visit Diagnosis: Visuospatial deficit  Attention and concentration deficit  Neurologic neglect syndrome  Other lack  of coordination  Other symptoms and signs involving cognitive functions following cerebral infarction    Problem List Patient Active Problem List   Diagnosis Date Noted  . S/P AV nodal ablation 07/28/19 07/29/2019  . S/P placement of cardiac pacemaker MDT 07/28/19 07/29/2019  .  AV block 07/28/2019  . CVA (cerebral vascular accident) ([place]) 06/16/2019  . Facial tingling 11/29/2018  . Tremor of left hand 11/29/2018  . Medicare annual wellness visit, subsequent 11/24/2018  . Dysuria 02/06/2018  . Iron deficiency anemia 10/21/2017  . Rapid atrial fibrillation ([place]) 08/19/2017  . Constipation 08/02/2017  . Numbness and tingling 07/14/2017  . Paresthesia 07/14/2017  . Unilateral primary osteoarthritis, left hip 05/04/2017  . Status post total replacement of left hip 05/04/2017  . Hip osteoarthritis 04/27/2017  . Venous stasis dermatitis of both lower extremities 01/08/2017  . Impacted cerumen of right ear 11/20/2016  . Osteopenia 10/25/2016  . Pedal edema 08/26/2016  . Varicose veins of both lower extremities 08/26/2016  . Estrogen deficiency 08/26/2016  . Screening mammogram, encounter for 08/26/2016  . Hemorrhoids 08/26/2016  . History of nonmelanoma skin cancer 01/01/2016  . Hip pain 08/02/2014  . Left knee pain 08/02/2014  . Chronic cough 05/08/2014  . Caregiver stress 08/16/2013  . Colon cancer screening 08/16/2013  . Encounter for therapeutic drug monitoring 04/20/2013  . Left ovarian cyst 03/14/2013  . (HFpEF) heart failure with preserved ejection fraction ([place]) 12/27/2012  . Palpitations 04/15/2012  . Cardiomyopathy, secondary --Resolved again 10/14 10/13/2010  . COLONIC POLYPS, ADENOMATOUS, HX OF 09/18/2009  . PULMONARY NODULE 12/20/2008  . GANGLION CYST 10/04/2007  . Hyperlipidemia 04/27/2007  . Depression with anxiety 04/27/2007  . Asthma, mild intermittent 04/27/2007  . INSOMNIA 04/27/2007  . ADENOMATOUS COLONIC POLYP 11/04/2006  . Hypothyroidism 09/02/2006     [facility] 08/25/2019, 10:34 AM  [facility] [address] [place], [place], [address] Phone: [phone]   Fax:  [phone]  Name: ALYSSHA HOUSH MRN: [id number] Date of Birth: 1933/01/07   Vianne Bulls, OTR/L [facility] [address] [place], [place]  [address] [phone] phone [phone] 08/25/19 10:36 AM

## 2019-08-26 NOTE — Progress Notes (Signed)
Cardiology Office Note Date:  08/26/2019  Patient ID:  [name], Key 1932-06-04, MRN [id number] PCP:  Abner Greenspan, MD  Cardiologist:  Dr. Caryl Comes   Chief Complaint: 4 week post Barbie Banner ablation   History of Present Illness: [name] is a 86 y.o. female with history of permanent AFib, hypothyroidism, asthma, HLD, TIA, CVA, AFlutter w/ ablation related to prior atriotomy undertaken at [facility] in March 2010, VHD s/p MV repair (2020), remote NICM with recovered LVEF, permanent Afib, >> Avnode ablation and PPM  She comes in today to be seen for Dr. Caryl Comes, last seen by him 07/06/19, at that visit noted significant reduction in exertional capacity post stroke more DOE, and more despondent in general, seems she mentioned life not worth living feeling so poorly. She was planned for AVnode ablation, PPM hoping the reduction in meds/fatigue associated with them and better HR control would improve quality of life..   She feels markedly improved from pre-pacer/AV node ablation.  She has had a clear improvement in her exertional capacity.  Not quite whee she would like to be, but much better.  No CP, palpitations. No near syncope or syncope, but will get what she describes as a sense of heaviness that comes over her body that is a perhaps weak feeling.  It is generalized sensation, random, and fairly fleeting.  She has this sensation while here for a few moments, She was seated, happened to still be on the programmer with VP at 70 and her BP was 110/60  She is tolerating the Eliquis well, no bleeding or signs of bleeding.   AF history AAD hx Tikosyn d/c with progression to permanent AF 07/28/2019 > AV node ablation CVA 2/2 subtherapeutic INR >> ASA and Eliquis  >>> eliquis alone  Device information MDT single chamber PPM implanted 07/28/2019 Is s/p AV node ablation  Past Medical History:  Diagnosis Date  . Allergic rhinitis   . 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 Arthritis   . Atrial fibrillation  -persistent cardiologist-  dr klein/  primary EP -- dr Tawanna Sat ([facility])   a. s/p PVI [name] 2010;  b. on tikosyn/coumadin;  c. 05/2009 Echo: EF 60-65%, Gr 2 DD. (first dx 09/ 2007)  . Bilateral lower extremity edema   . Bleeding hemorrhoid   . Carotid stenosis    mild (hosp 3/11)- consult by vasc/ Dr Donnetta Hutching  . Complication of anesthesia    hard to wake  . Diverticulosis of colon   . Dyspnea    on exertion-climbing stairs  . Fatty liver   . H/O cardiac radiofrequency ablation    01/ 2008 at [facility] /  03/ 2010  at [facility]  . Heart failure with preserved ejection fraction ([place])   . History of adenomatous polyp of colon    tubular adenoma's  . History of cardiomyopathy    secondary tachycardia-induced cardiomyopathy -- resolved 2014  . History of squamous cell carcinoma in situ (SCCIS) of skin    05/ 2017  nasal bridge and right medial knee  . History of transient ischemic attack (TIA)    01-24-2005 and 06-12-2009  . Hyperlipidemia   . Hypothyroidism   . Mild intermittent asthma    reacts to cats  . Mixed stress and urge urinary incontinence   . Pulmonary nodule   . S/P AV nodal ablation 07/28/19 07/29/2019  . S/P mitral valve repair 10-23-1998  dr Boyce Medici at [facility]   for MVP and regurg. (  annuloplasty ring procedure)  . S/P placement of cardiac pacemaker MDT 07/28/19 07/29/2019    Past Surgical History:  Procedure Laterality Date  . APPENDECTOMY  1978  . AV NODE ABLATION N/A 07/28/2019   Procedure: AV NODE ABLATION;  Surgeon: Deboraha Sprang, MD;  Location: [place] [facility];  Service: Cardiovascular;  Laterality: N/A;  . BUBBLE STUDY  06/19/2019   Procedure: BUBBLE STUDY;  Surgeon: Pixie Casino, MD;  Location: [facility] ENDOSCOPY;  Service: Cardiovascular;;  . CARDIAC ELECTROPHYSIOLOGY [place]  01/ 2008    at [place]   right-sided ablation atrial flutter  . CARDIAC ELECTROPHYSIOLOGY STUDY AND ABLATION  03/ 2010   dr Jaymes Graff at [facility]    AV node ablation and pulmonary vein isolation for atrial fib  . CARDIOVERSION  06-18-2006;  07-13-2006;  10-19-2010;  10-27-2010  . COLONOSCOPY    . COLONOSCOPY WITH PROPOFOL N/A 10/13/2017   Procedure: COLONOSCOPY WITH PROPOFOL;  Surgeon: [name], MD;  Location: [facility] ENDOSCOPY;  Service: Gastroenterology;  Laterality: N/A;  . CYSTO/ TRANSURETHRAL COLLAGEN INJECTION THERAPY  07-26-2007   dr Matilde Sprang  . DILATION AND CURETTAGE OF UTERUS    . ESOPHAGOGASTRODUODENOSCOPY (EGD) WITH PROPOFOL N/A 10/13/2017   Procedure: ESOPHAGOGASTRODUODENOSCOPY (EGD) WITH PROPOFOL;  Surgeon: [name], MD;  Location: [facility] ENDOSCOPY;  Service: Gastroenterology;  Laterality: N/A;  . EXCISIONAL HEMORRHOIDECTOMY  1980s  . GIVENS CAPSULE STUDY N/A 12/08/2017   Procedure: GIVENS CAPSULE STUDY;  Surgeon: [name], MD;  Location: [facility] ENDOSCOPY;  Service: Gastroenterology;  Laterality: N/A;  . HEMORRHOID SURGERY N/A 10/29/2016   Procedure: HEMORRHOIDECTOMY;  Surgeon: Leighton Ruff, MD;  Location: [facility];  Service: General;  Laterality: N/A;  . MITRAL VALVE ANNULOPLASTY  10/23/1998   "Model [id number]; [place] [facility] [id number]"; size 60mm; [facility]; Dr. Boyce Medici  . PACEMAKER IMPLANT N/A 07/28/2019   Procedure: PACEMAKER IMPLANT;  Surgeon: Deboraha Sprang, MD;  Location: [place] [facility];  Service: Cardiovascular;  Laterality: N/A;  . [place]  . TEE WITH CARDIOVERSION  05-06-2006 at [facility];  01-02-2013 at [facility]  . TEE WITHOUT CARDIOVERSION N/A 06/19/2019   Procedure: TRANSESOPHAGEAL ECHOCARDIOGRAM (TEE);  Surgeon: Pixie Casino, MD;  Location: [facility] ENDOSCOPY;  Service: Cardiovascular;  Laterality: N/A;  . TOTAL HIP ARTHROPLASTY Left 05/04/2017   Procedure: LEFT TOTAL HIP ARTHROPLASTY ANTERIOR APPROACH;  Surgeon: Mcarthur Rossetti, MD;  Location: [place];  Service: Orthopedics;  Laterality: Left;  . TRANSTHORACIC ECHOCARDIOGRAM  05-01-2015   dr Caryl Comes   ef 50-55%/  mild AV sclerosis  without stenosis/  post MV repair with mild central MR (valve area by pressure half-time 2cm^2,  valve area by continutity equation 0.91cm^2, peak grandiant 55mmHg)/  severe LAE/ mild TR/ mild RAE   . TUBAL LIGATION Bilateral 1978    Current Outpatient Medications  Medication Sig Dispense Refill  . acetaminophen (TYLENOL) 325 MG tablet Take 325 mg by mouth every 6 (six) hours as needed for moderate pain or headache.    . albuterol (PROVENTIL HFA;VENTOLIN HFA) 108 (90 Base) MCG/ACT inhaler Inhale 2 puffs into the lungs every 4 (four) hours as needed for wheezing or shortness of breath. 1 Inhaler 0  . alendronate (FOSAMAX) 70 MG tablet TAKE 1 TABLET EVERY 7 DAYS. TAKE WITH A FULL GLASS OF WATER ON AN EMPTY STOMACH. 12 tablet 1  . apixaban (ELIQUIS) 5 MG TABS tablet Take 1 tablet (5 mg total) by mouth 2 (two) times daily. 180 tablet 1  .  aspirin EC 81 MG tablet Take 1 tablet (81 mg total) by mouth daily. 90 tablet 3  . diclofenac Sodium (VOLTAREN) 1 % GEL Apply 1 application topically 4 (four) times daily as needed (pain).    [name] diphenhydrAMINE (BENADRYL) 25 mg capsule Take 50 mg by mouth every 6 (six) hours as needed for itching.    . diphenhydrAMINE-zinc acetate (BENADRYL) cream Apply 1 application topically 3 (three) times daily as needed for itching.    . ezetimibe (ZETIA) 10 MG tablet Take 1 tablet (10 mg total) by mouth daily. 90 tablet 3  . ferrous sulfate 325 (65 FE) MG EC tablet Take 1 tablet (325 mg total) by mouth daily with breakfast. 90 tablet 3  . fluticasone (FLONASE) 50 MCG/ACT nasal spray Place 1 spray into both nostrils daily as needed for allergies.    . furosemide (LASIX) 40 MG tablet Take 40 mg by mouth daily as needed for edema.    [name] levothyroxine (SYNTHROID) 25 MCG tablet Take 1 tablet (25 mcg total) by mouth daily before breakfast. 90 tablet 3  . loratadine (CLARITIN) 10 MG tablet Take 10 mg by mouth daily as needed for allergies.     . metoprolol tartrate (LOPRESSOR) 25 MG  tablet Take 1 tablet (25 mg total) by mouth 2 (two) times daily. 60 tablet 6  . Polyethyl Glycol-Propyl Glycol (LUBRICANT EYE DROPS) 0.4-0.3 % SOLN Place 1-2 drops into both eyes 3 (three) times daily as needed (for dry eyes.).     No current facility-administered medications for this visit.    Allergies:   Amiodarone hcl, Penicillins, and Statins   Social History:  The patient  reports that she has never smoked. She has never used smokeless tobacco. She reports current alcohol use. She reports that she does not use drugs.   Family History:  The patient's family history includes Alcohol abuse in her father; Cancer in her father; Lung cancer in her father.  ROS:  Please see the history of present illness.  All other systems are reviewed and otherwise negative.   PHYSICAL EXAM:  VS:  There were no vitals taken for this visit. BMI: There is no height or weight on file to calculate BMI. Well nourished, well developed, in no acute distress  HEENT: normocephalic, atraumatic  Neck: no JVD, carotid bruits or masses Cardiac:  RRR; no significant murmurs, no rubs, or gallops Lungs:  CTA b/l, no wheezing, rhonchi or rales  Abd: soft, nontender MS: no deformity or atrophy Ext: several superficial spider veins, trace edema  Skin: warm and dry, no rash Neuro:  No gross deficits appreciated Psych: euthymic mood, full affect  PPM site: she has a tiny scab at the very medial edge.  This looks very superficial, I do not appreciate a sticth.  No erythema, edema, fluctuation.  No evidence of infecion   EKG:  Not done today  PPM interrogation done today and reviewed by myself Battery and lead measurements are good A port is plugged RV lead remains at acute implant outputs 95.3% VP I note infrequent PVC/VS beats while she is here Base pacing rate was reduced to 70bpm, no other programming changes were made   05/01/15: TTE Study Conclusions - Left ventricle: Septal and apical hypokinesis The  cavity size was   normal. Wall thickness was normal. Systolic function was normal.   The estimated ejection fraction was in the range of 50% to 55%. - Mitral valve: Post mitral valve repair with mild central MR. - Left atrium:  The atrium was severely dilated. - Right atrium: The atrium was mildly dilated. - Atrial septum: No defect or patent foramen ovale was identified.   06/19/2019: TEE IMPRESSIONS  1. Left ventricular ejection fraction, by estimation, is 55 to 60%. The  left ventricle has normal function. The left ventricle has no regional  wall motion abnormalities. There is mild left ventricular hypertrophy.  2. Right ventricular systolic function is normal. The right ventricular  size is normal.  3. Left atrial size was severely dilated. No left atrial/left atrial  appendage thrombus was detected.  4. The mitral valve has been repaired/replaced. Mild to moderate mitral  valve regurgitation. Mild mitral stenosis. There is a prosthetic  annuloplasty ring present in the mitral position. Procedure Date:  10/23/1998.  5. The tricuspid valve is abnormal. Tricuspid valve regurgitation is  moderate.  6. The aortic valve is tricuspid. Aortic valve regurgitation is not  visualized.  7. Late microbubble contrast suggestive of intrahepatic or intrapulmonary  shunt.     06/16/2019: TTE IMPRESSIONS  1. Left ventricular ejection fraction, by estimation, is 55 to 60%. The  left ventricle has normal function. The left ventricle has no regional  wall motion abnormalities. Left ventricular diastolic function could not  be evaluated.  2. Right ventricular systolic function is normal. The right ventricular  size is normal. There is mildly elevated pulmonary artery systolic  pressure. The estimated right ventricular systolic pressure is 40.9 mmHg.  3. Left atrial size was severely dilated.  4. A mitral valve annuloplasty ring is present. There is a heavily  calcified focal mass-like  structure present on the PMVL. This is likely  deterioration of the PMVL with mitral annular calcification. There are no  mobile components to it. There is no  apparent destruction of the valve. There is mild to moderate MR. There is  mild mitral stenosis. This is best viewed in the PLAX. When compared with  the prior echo this was present but not specifically commented on. The  mitral valve has been  repaired/replaced. Mild to moderate mitral valve regurgitation. The mean  mitral valve gradient is 6.3 mmHg with average heart rate of 80 bpm. There  is a prosthetic annuloplasty ring present in the mitral position.  Procedure Date: 10/23/1998.  5. The aortic valve is tricuspid. Aortic valve regurgitation is not  visualized. Mild aortic valve sclerosis is present, with no evidence of  aortic valve stenosis.  6. The inferior vena cava is dilated in size with <50% respiratory  variability, suggesting right atrial pressure of 15 mmHg.   Comparison(s): A prior study was performed on 12/01/2016. No significant  change from prior study.     Recent Labs: 06/16/2019: TSH 3.817 06/19/2019: ALT 14 07/25/2019: BUN 16; Creatinine, Ser 0.84; Hemoglobin 12.8; Platelets 192; Potassium 4.6; Sodium 139  06/17/2019: Cholesterol 218; HDL 28; LDL Cholesterol 169; Total CHOL/HDL Ratio 7.8; Triglycerides 104; VLDL 21   CrCl cannot be calculated (Patient's most recent lab result is older than the maximum 21 days allowed.).   Wt Readings from Last 3 Encounters:  07/29/19 193 lb 12.6 oz (87.9 kg)  06/29/19 184 lb (83.5 kg)  06/19/19 185 lb (83.9 kg)     Other studies reviewed: Additional studies/records reviewed today include: summarized above  ASSESSMENT AND PLAN:  1. Permanent AFib > AV node ablation     CHA2DS2Vasc is 6, eliquis, appropriately dosed   2. PPM s/p AVNode ablation     Intact function     pacing rate reduced  to 70bpm today   3. HTN     She denies HTN, states all of her medicines  historically have been 2/2 her AFib     She would like to be off as many medicines as possible     We will wean her off metoprolol, she is asked to monitor her BP and if she finds BP > 130/90 on any regular basis to let us know     Should she need BP medicine, may try non BB given she has had trouble with alopecia and fatigue    Disposition: See Dr. Caryl Comes as scheduled, sooner if needed.  Remotes Q 17mo   Current medicines are reviewed at length with the patient today.  The patient did not have any concerns regarding medicines.  Haywood Lasso, PA-C 08/26/2019 10:01 AM     [facility] [place] [address] [phone] (office)  [phone] (fax)

## 2019-08-28 ENCOUNTER — Other Ambulatory Visit: Payer: Self-pay

## 2019-08-28 ENCOUNTER — Ambulatory Visit (INDEPENDENT_AMBULATORY_CARE_PROVIDER_SITE_OTHER): Payer: Medicare Other | Admitting: Physician Assistant

## 2019-08-28 VITALS — BP 122/76 | HR 88 | Ht 65.0 in | Wt 190.0 lb

## 2019-08-28 DIAGNOSIS — Z950 Presence of cardiac pacemaker: Secondary | ICD-10-CM | POA: Diagnosis not present

## 2019-08-28 DIAGNOSIS — I639 Cerebral infarction, unspecified: Secondary | ICD-10-CM | POA: Diagnosis not present

## 2019-08-28 DIAGNOSIS — I4821 Permanent atrial fibrillation: Secondary | ICD-10-CM | POA: Diagnosis not present

## 2019-08-28 DIAGNOSIS — I4430 Unspecified atrioventricular block: Secondary | ICD-10-CM

## 2019-08-28 MED ORDER — METOPROLOL TARTRATE 25 MG PO TABS: 25.0000 mg | ORAL_TABLET | Freq: Every day | ORAL | 1 refills | Status: DC

## 2019-08-28 MED ORDER — EZETIMIBE 10 MG PO TABS: 10.0000 mg | ORAL_TABLET | Freq: Every day | ORAL | 3 refills | Status: DC

## 2019-08-28 NOTE — Patient Instructions (Addendum)
Medication Instructions:   START TAKING METOPROLOL ONCE A DAY FOR 3 DAYS THEN STOP   *If you need a refill on your cardiac medications before your next appointment, please call your pharmacy*   Lab Work: NONE ORDERED  TODAY   If you have labs (blood work) drawn today and your tests are completely normal, you will receive your results only by: [name] MyChart Message (if you have MyChart) OR . A paper copy in the mail If you have any lab test that is abnormal or we need to change your treatment, we will call you to review the results.   Testing/Procedures:NONE ORDERED  TODAY    Follow-Up: At [facility], you and your health needs are our priority.  As part of our continuing mission to provide you with exceptional heart care, we have created designated Provider Care Teams.  These Care Teams include your primary Cardiologist (physician) and Advanced Practice Providers (APPs -  Physician Assistants and Nurse Practitioners) who all work together to provide you with the care you need, when you need it.  We recommend signing up for the patient portal called "MyChart".  Sign up information is provided on this After Visit Summary.  MyChart is used to connect with patients for Virtual Visits (Telemedicine).  Patients are able to view lab/test results, encounter notes, upcoming appointments, etc.  Non-urgent messages can be sent to your provider as well.   To learn more about what you can do with MyChart, go to [email or web address].    Your next appointment:  As scheduled   The format for your next appointment:   In Person  Provider:   You may see Dr. Caryl Comes   Other Instructions

## 2019-08-29 ENCOUNTER — Encounter: Payer: Self-pay | Admitting: Occupational Therapy

## 2019-08-29 ENCOUNTER — Ambulatory Visit: Payer: Medicare Other | Admitting: Occupational Therapy

## 2019-08-29 DIAGNOSIS — R414 Neurologic neglect syndrome: Secondary | ICD-10-CM

## 2019-08-29 DIAGNOSIS — R278 Other lack of coordination: Secondary | ICD-10-CM

## 2019-08-29 DIAGNOSIS — R41842 Visuospatial deficit: Secondary | ICD-10-CM | POA: Diagnosis not present

## 2019-08-29 DIAGNOSIS — R41840 Attention and concentration deficit: Secondary | ICD-10-CM

## 2019-08-29 DIAGNOSIS — I69318 Other symptoms and signs involving cognitive functions following cerebral infarction: Secondary | ICD-10-CM

## 2019-08-29 NOTE — Therapy (Signed)
[facility] [address] [place], [place], [address] Phone: [phone]   Fax:  [phone]  Occupational Therapy Treatment  Patient Details  Name: [name] MRN: [id number] Date of Birth: 05/06/1932 Referring Provider (OT): Dr. Hosie Poisson (Dr. Glori Bickers (PCP))   Encounter Date: 08/29/2019  OT End of Session - 08/29/19 0807    Visit Number  7    Number of Visits  9    Date for OT Re-Evaluation  09/08/19   modified due to pacemaker surgery   Authorization Type  Medicare / BCBS    Authorization Time Period  07/07/19-10/05/19    Authorization - Visit Number  7    Authorization - Number of Visits  10    Progress Note Due on Visit  10    OT Start Time  0805    OT Stop Time  0845    OT Time Calculation (min)  40 min    Activity Tolerance  Patient tolerated treatment well    Behavior During Therapy  [facility] for tasks assessed/performed       Past Medical History:  Diagnosis Date  . Allergic rhinitis   . 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 Arthritis   . Atrial fibrillation -persistent cardiologist-  dr klein/  primary EP -- dr Tawanna Sat ([facility])   a. s/p PVI [name] 2010;  b. on tikosyn/coumadin;  c. 05/2009 Echo: EF 60-65%, Gr 2 DD. (first dx 09/ 2007)  . Bilateral lower extremity edema   . Bleeding hemorrhoid   . Carotid stenosis    mild (hosp 3/11)- consult by vasc/ Dr Donnetta Hutching  . Complication of anesthesia    hard to wake  . Diverticulosis of colon   . Dyspnea    on exertion-climbing stairs  . Fatty liver   . H/O cardiac radiofrequency ablation    01/ 2008 at [facility] /  03/ 2010  at [facility]  . Heart failure with preserved ejection fraction ([place])   . History of adenomatous polyp of colon    tubular adenoma's  . History of cardiomyopathy    secondary tachycardia-induced cardiomyopathy -- resolved 2014  . History of squamous cell carcinoma in situ (SCCIS) of skin    05/ 2017  nasal bridge and right medial  knee  . History of transient ischemic attack (TIA)    01-24-2005 and 06-12-2009  . Hyperlipidemia   . Hypothyroidism   . Mild intermittent asthma    reacts to cats  . Mixed stress and urge urinary incontinence   . Pulmonary nodule   . S/P AV nodal ablation 07/28/19 07/29/2019  . S/P mitral valve repair 10-23-1998  dr Boyce Medici at [facility]   for MVP and regurg. (annuloplasty ring procedure)  . S/P placement of cardiac pacemaker MDT 07/28/19 07/29/2019    Past Surgical History:  Procedure Laterality Date  . APPENDECTOMY  1978  . AV NODE ABLATION N/A 07/28/2019   Procedure: AV NODE ABLATION;  Surgeon: Deboraha Sprang, MD;  Location: [facility] CV LAB;  Service: Cardiovascular;  Laterality: N/A;  . BUBBLE STUDY  06/19/2019   Procedure: BUBBLE STUDY;  Surgeon: Pixie Casino, MD;  Location: [facility] ENDOSCOPY;  Service: Cardiovascular;;  . CARDIAC ELECTROPHYSIOLOGY [place]  01/ 2008    at [facility]   right-sided ablation atrial flutter  . CARDIAC ELECTROPHYSIOLOGY STUDY AND ABLATION  03/ 2010   dr Jaymes Graff at [facility]   AV node ablation and pulmonary vein isolation  for atrial fib  . CARDIOVERSION  06-18-2006;  07-13-2006;  10-19-2010;  10-27-2010  . COLONOSCOPY    . COLONOSCOPY WITH PROPOFOL N/A 10/13/2017   Procedure: COLONOSCOPY WITH PROPOFOL;  Surgeon: [name], MD;  Location: [facility] ENDOSCOPY;  Service: Gastroenterology;  Laterality: N/A;  . CYSTO/ TRANSURETHRAL COLLAGEN INJECTION THERAPY  07-26-2007   dr Matilde Sprang  . DILATION AND CURETTAGE OF UTERUS    . ESOPHAGOGASTRODUODENOSCOPY (EGD) WITH PROPOFOL N/A 10/13/2017   Procedure: ESOPHAGOGASTRODUODENOSCOPY (EGD) WITH PROPOFOL;  Surgeon: [name], MD;  Location: [facility] ENDOSCOPY;  Service: Gastroenterology;  Laterality: N/A;  . EXCISIONAL HEMORRHOIDECTOMY  1980s  . GIVENS CAPSULE STUDY N/A 12/08/2017   Procedure: GIVENS CAPSULE STUDY;  Surgeon: [name], MD;  Location: [facility] ENDOSCOPY;  Service: Gastroenterology;   Laterality: N/A;  . HEMORRHOID SURGERY N/A 10/29/2016   Procedure: HEMORRHOIDECTOMY;  Surgeon: Leighton Ruff, MD;  Location: [facility];  Service: General;  Laterality: N/A;  . MITRAL VALVE ANNULOPLASTY  10/23/1998   "Model 4625; [employer] [id number]"; size 31mm; [facility]; Dr. Boyce Medici  . PACEMAKER IMPLANT N/A 07/28/2019   Procedure: PACEMAKER IMPLANT;  Surgeon: Deboraha Sprang, MD;  Location: [place] CV LAB;  Service: Cardiovascular;  Laterality: N/A;  . [place]  . TEE WITH CARDIOVERSION  05-06-2006 at [facility];  01-02-2013 at [facility]  . TEE WITHOUT CARDIOVERSION N/A 06/19/2019   Procedure: TRANSESOPHAGEAL ECHOCARDIOGRAM (TEE);  Surgeon: Pixie Casino, MD;  Location: [facility] ENDOSCOPY;  Service: Cardiovascular;  Laterality: N/A;  . TOTAL HIP ARTHROPLASTY Left 05/04/2017   Procedure: LEFT TOTAL HIP ARTHROPLASTY ANTERIOR APPROACH;  Surgeon: Mcarthur Rossetti, MD;  Location: [facility];  Service: Orthopedics;  Laterality: Left;  . TRANSTHORACIC ECHOCARDIOGRAM  05-01-2015   dr Caryl Comes   ef 50-55%/  mild AV sclerosis without stenosis/  post MV repair with mild central MR (valve area by pressure half-time 2cm^2,  valve area by continutity equation 0.91cm^2, peak grandiant 39mmHg)/  severe LAE/ mild TR/ mild RAE   . TUBAL LIGATION Bilateral 1978    There were no vitals filed for this visit.  Subjective Assessment - 08/29/19 0807    Subjective   That was challenging.  This has been helpful.    Pertinent History  CVA     (MRI showed acute infarct in posterior R MCA).  PMH:  mitral valve repair, a-fib, chronic diastolic heart failure, carotid stenosis, hypothyroidism, hyperlipidemia, TIA, HTN, pacemaker surgery scheduled 07/28/19    Limitations  s/p pacemaker surgery  07/28/19; no heavy lifting for 6 weeks    Patient Stated Goals  be able to continue to drive safely, improved use of LUE    Currently in Pain?  No/denies         Divided attention:  Ambulating while counting  backwards by 3's (mod difficulty) while tossing scarf between hands and difficulty continuing with physical task while thinking.  Then while counting backwards by 1's with min difficulty.  Copying PVC design for visual scanning and problem-solving with good accuracy, min incr time.  Coping tangram (7 pieces of cleverness) design:  Pt with mod cues/difficulty with first design after looking at answer key (unable to complete without key).  2nd design with mod difficulty/cues (requried key).       OT Education - 08/29/19 [phone]    Education Details  Cognitive HEP--see pt instructions    Person(s) Educated  Patient    Methods  Explanation;Handout;Demonstration    Comprehension  Verbalized understanding;Returned demonstration  OT Long Term Goals - 08/29/19 0823      OT LONG TERM GOAL #1   Title  Pt will be independent with visual/cognitive HEP.--check LTGs 08/25/19)    Time  4    Period  Weeks    Status  Achieved      OT LONG TERM GOAL #2   Title  Pt will be independent with coordination HEP.    Time  4    Period  Weeks    Status  Achieved      OT LONG TERM GOAL #3   Title  Pt will perform environmental scanning in busy environment with at least 90% accuracy for incr safety in community.    Time  4    Period  Weeks    Status  Achieved   08/15/19:  100% accuracy     OT LONG TERM GOAL #4   Title  Pt will be able to perform simple environmental scanning with divided attention with at least 85% accuracy for incr safety for driving/community activities.    Time  4    Period  Weeks    Status  New      OT LONG TERM GOAL #5   Title  Pt will improve coordination for ADLs as shown by improving time on 9-hole peg test by at least 7sec.    Baseline  33.93sec    Time  4    Period  Weeks    Status  New            Plan - 08/29/19 0808    Clinical Impression Statement  Pt continues to progress towards goals, but demo difficulty with divided attention.    OT Occupational  Profile and History  Detailed Assessment- Review of Records and additional review of physical, cognitive, psychosocial history related to current functional performance    Occupational performance deficits (Please refer to evaluation for details):  ADL's;IADL's;Leisure;Social Participation    Body Structure / Function / Physical Skills  ADL;Vision;IADL;Sensation;Coordination;FMC;UE functional use;GMC;Proprioception    Cognitive Skills  Attention;Memory;Perception;Safety Awareness    Rehab Potential  Good    Clinical Decision Making  Several treatment options, min-mod task modification necessary    Comorbidities Affecting Occupational Performance:  May have comorbidities impacting occupational performance    Modification or Assistance to Complete Evaluation   Min-Moderate modification of tasks or assist with assess necessary to complete eval    OT Frequency  2x / week    OT Duration  4 weeks   +eval (or 8 visits over 6 weeks as schedule may be modified due to pt's upcoming Pacemaker surgery)   OT Treatment/Interventions  Self-care/ADL training;DME and/or AE instruction;Therapeutic activities;Therapeutic exercise;Cognitive remediation/compensation;Visual/perceptual remediation/compensation;Neuromuscular education;Patient/family education    Plan  simple divided attention; ? trailmaking/copying drawing; check remaining goal with anticipated d/c next visit    Consulted and Agree with Plan of Care  Patient       Patient will benefit from skilled therapeutic intervention in order to improve the following deficits and impairments:   Body Structure / Function / Physical Skills: ADL, Vision, IADL, Sensation, Coordination, FMC, UE functional use, GMC, Proprioception Cognitive Skills: Attention, Memory, Perception, Safety Awareness     Visit Diagnosis: Visuospatial deficit  Attention and concentration deficit  Neurologic neglect syndrome  Other lack of coordination  Other symptoms and signs  involving cognitive functions following cerebral infarction    Problem List Patient Active Problem List   Diagnosis Date Noted  . S/P AV nodal ablation 07/28/19 07/29/2019  .  S/P placement of cardiac pacemaker MDT 07/28/19 07/29/2019  . AV block 07/28/2019  . CVA (cerebral vascular accident) ([place]) 06/16/2019  . Facial tingling 11/29/2018  . Tremor of left hand 11/29/2018  . Medicare annual wellness visit, subsequent 11/24/2018  . Dysuria 02/06/2018  . Iron deficiency anemia 10/21/2017  . Rapid atrial fibrillation ([place]) 08/19/2017  . Constipation 08/02/2017  . Numbness and tingling 07/14/2017  . Paresthesia 07/14/2017  . Unilateral primary osteoarthritis, left hip 05/04/2017  . Status post total replacement of left hip 05/04/2017  . Hip osteoarthritis 04/27/2017  . Venous stasis dermatitis of both lower extremities 01/08/2017  . Impacted cerumen of right ear 11/20/2016  . Osteopenia 10/25/2016  . Pedal edema 08/26/2016  . Varicose veins of both lower extremities 08/26/2016  . Estrogen deficiency 08/26/2016  . Screening mammogram, encounter for 08/26/2016  . Hemorrhoids 08/26/2016  . History of nonmelanoma skin cancer 01/01/2016  . Hip pain 08/02/2014  . Left knee pain 08/02/2014  . Chronic cough 05/08/2014  . Caregiver stress 08/16/2013  . Colon cancer screening 08/16/2013  . Encounter for therapeutic drug monitoring 04/20/2013  . Left ovarian cyst 03/14/2013  . (HFpEF) heart failure with preserved ejection fraction ([place]) 12/27/2012  . Palpitations 04/15/2012  . Cardiomyopathy, secondary --Resolved again 10/14 10/13/2010  . COLONIC POLYPS, ADENOMATOUS, HX OF 09/18/2009  . PULMONARY NODULE 12/20/2008  . GANGLION CYST 10/04/2007  . Hyperlipidemia 04/27/2007  . Depression with anxiety 04/27/2007  . Asthma, mild intermittent 04/27/2007  . INSOMNIA 04/27/2007  . ADENOMATOUS COLONIC POLYP 11/04/2006  . Hypothyroidism 09/02/2006    [facility] 08/29/2019, 9:24 AM  [facility] [address] [place], [place], [address] Phone: [phone]   Fax:  [phone]  Name: DERHONDA EASTLICK MRN: [id number] Date of Birth: 07/26/1932   Vianne Bulls, OTR/L [facility] [address] [place], [place]  [address] [phone] phone [phone] 08/29/19 9:24 AM

## 2019-08-29 NOTE — Patient Instructions (Signed)
     Add cognitive activities to walking or writing:   - think of animal/food/city with each letter of the alphabet  - counting backwards by 1's, 2's, 3's, etc.  - thinking of as many vegetables/boys names, countries as you can

## 2019-09-01 ENCOUNTER — Encounter: Payer: Self-pay | Admitting: Occupational Therapy

## 2019-09-01 ENCOUNTER — Other Ambulatory Visit: Payer: Self-pay

## 2019-09-01 ENCOUNTER — Ambulatory Visit: Payer: Medicare Other | Admitting: Occupational Therapy

## 2019-09-01 DIAGNOSIS — R414 Neurologic neglect syndrome: Secondary | ICD-10-CM

## 2019-09-01 DIAGNOSIS — R41840 Attention and concentration deficit: Secondary | ICD-10-CM

## 2019-09-01 DIAGNOSIS — I69318 Other symptoms and signs involving cognitive functions following cerebral infarction: Secondary | ICD-10-CM

## 2019-09-01 DIAGNOSIS — R41842 Visuospatial deficit: Secondary | ICD-10-CM | POA: Diagnosis not present

## 2019-09-01 DIAGNOSIS — R278 Other lack of coordination: Secondary | ICD-10-CM

## 2019-09-01 NOTE — Therapy (Signed)
[facility] [address] [place], [place], [address] Phone: [phone]   Fax:  [phone]  Occupational Therapy Treatment  Patient Details  Name: [name] MRN: [id number] Date of Birth: 1932/05/12 Referring Provider (OT): Dr. Hosie Poisson (Dr. Glori Bickers (PCP))   Encounter Date: 09/01/2019   OT End of Session - 09/01/19 0800    Visit Number 8    Number of Visits 9    Date for OT Re-Evaluation 09/08/19   modified due to pacemaker surgery   Authorization Type Medicare / BCBS    Authorization Time Period 07/07/19-10/05/19    Authorization - Visit Number 8    Authorization - Number of Visits 10    Progress Note Due on Visit 10    OT Start Time 0803    OT Stop Time 0845    OT Time Calculation (min) 42 min    Activity Tolerance Patient tolerated treatment well    Behavior During Therapy [facility] for tasks assessed/performed           Past Medical History:  Diagnosis Date  . Allergic rhinitis   . 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 Arthritis   . Atrial fibrillation -persistent cardiologist-  dr klein/  primary EP -- dr Tawanna Sat ([facility])   a. s/p PVI [name] 2010;  b. on tikosyn/coumadin;  c. 05/2009 Echo: EF 60-65%, Gr 2 DD. (first dx 09/ 2007)  . Bilateral lower extremity edema   . Bleeding hemorrhoid   . Carotid stenosis    mild (hosp 3/11)- consult by vasc/ Dr Donnetta Hutching  . Complication of anesthesia    hard to wake  . Diverticulosis of colon   . Dyspnea    on exertion-climbing stairs  . Fatty liver   . H/O cardiac radiofrequency ablation    01/ 2008 at [facility] /  03/ 2010  at [facility]  . Heart failure with preserved ejection fraction ([place])   . History of adenomatous polyp of colon    tubular adenoma's  . History of cardiomyopathy    secondary tachycardia-induced cardiomyopathy -- resolved 2014  . History of squamous cell carcinoma in situ (SCCIS) of skin    05/ 2017  nasal bridge and right medial knee  .  History of transient ischemic attack (TIA)    01-24-2005 and 06-12-2009  . Hyperlipidemia   . Hypothyroidism   . Mild intermittent asthma    reacts to cats  . Mixed stress and urge urinary incontinence   . Pulmonary nodule   . S/P AV nodal ablation 07/28/19 07/29/2019  . S/P mitral valve repair 10-23-1998  dr Boyce Medici at [facility]   for MVP and regurg. (annuloplasty ring procedure)  . S/P placement of cardiac pacemaker MDT 07/28/19 07/29/2019    Past Surgical History:  Procedure Laterality Date  . APPENDECTOMY  1978  . AV NODE ABLATION N/A 07/28/2019   Procedure: AV NODE ABLATION;  Surgeon: Deboraha Sprang, MD;  Location: [place] CV LAB;  Service: Cardiovascular;  Laterality: N/A;  . BUBBLE STUDY  06/19/2019   Procedure: BUBBLE STUDY;  Surgeon: Pixie Casino, MD;  Location: [facility] ENDOSCOPY;  Service: Cardiovascular;;  . CARDIAC ELECTROPHYSIOLOGY [place]  01/ 2008    at [facility]   right-sided ablation atrial flutter  . CARDIAC ELECTROPHYSIOLOGY STUDY AND ABLATION  03/ 2010   dr Jaymes Graff at [facility]   AV node ablation and pulmonary vein isolation for atrial fib  . CARDIOVERSION  06-18-2006;  07-13-2006;  10-19-2010;  10-27-2010  . COLONOSCOPY    . COLONOSCOPY WITH PROPOFOL N/A 10/13/2017   Procedure: COLONOSCOPY WITH PROPOFOL;  Surgeon: [name], MD;  Location: [facility] ENDOSCOPY;  Service: Gastroenterology;  Laterality: N/A;  . CYSTO/ TRANSURETHRAL COLLAGEN INJECTION THERAPY  07-26-2007   dr Matilde Sprang  . DILATION AND CURETTAGE OF UTERUS    . ESOPHAGOGASTRODUODENOSCOPY (EGD) WITH PROPOFOL N/A 10/13/2017   Procedure: ESOPHAGOGASTRODUODENOSCOPY (EGD) WITH PROPOFOL;  Surgeon: [name], MD;  Location: [facility] ENDOSCOPY;  Service: Gastroenterology;  Laterality: N/A;  . EXCISIONAL HEMORRHOIDECTOMY  1980s  . GIVENS CAPSULE STUDY N/A 12/08/2017   Procedure: GIVENS CAPSULE STUDY;  Surgeon: [name], MD;  Location: [facility] ENDOSCOPY;  Service: Gastroenterology;  Laterality:  N/A;  . HEMORRHOID SURGERY N/A 10/29/2016   Procedure: HEMORRHOIDECTOMY;  Surgeon: Leighton Ruff, MD;  Location[facility];  Service: General;  Laterality: N/A;  . MITRAL VALVE ANNULOPLASTY  10/23/1998   "Model 4625; Campbell Lerner [id number]"; size 63m; [facility] CB[facility] . PACEMAKER IMPLANT N/A 07/28/2019   Procedure: PACEMAKER IMPLANT;  Surgeon: KDeboraha Sprang MD;  [place];  Service: Cardiovascular;  Laterality: N/A;  . PLandess . [facility] . TEE WITHOUT CARDIOVERSION N/A 06/19/2019   Procedure: TRANSESOPHAGEAL ECHOCARDIOGRAM (TEE);  Surgeon: HPixie Casino MD;  [facility];  Service: Cardiovascular;  Laterality: N/A;  . TOTAL HIP ARTHROPLASTY Left 05/04/2017   Procedure: LEFT TOTAL HIP ARTHROPLASTY ANTERIOR APPROACH;  Surgeon: BMcarthur Rossetti MD;  [place]  Service: Orthopedics;  Laterality: Left;  . TRANSTHORACIC ECHOCARDIOGRAM  05-01-2015   dr kCaryl Comes  ef 50-55%/  mild AV sclerosis without stenosis/  post MV repair with mild central MR (valve area by pressure half-time 2cm^2,  valve area by continutity equation 0.91cm^2, peak grandiant 825mg)/  severe LAE/ mild TR/ mild RAE   . TUBAL LIGATION Bilateral 1978    There were no vitals filed for this visit.   Subjective Assessment - 09/01/19 0800    Pertinent History CVA     (MRI showed acute infarct in posterior R MCA).  PMH:  mitral valve repair, a-fib, chronic diastolic heart failure, carotid stenosis, hypothyroidism, hyperlipidemia, TIA, HTN, pacemaker surgery scheduled 07/28/19    Limitations s/p pacemaker surgery  07/28/19; no heavy lifting for 6 weeks    Patient Stated Goals be able to continue to drive safely, improved use of LUE    Currently in Pain? No/denies            Environmental scanning in busy environment while performing simple physical tasks (ambulating while tossing scarf  between hands).  Pt with approx 85-90% accuracy with scanning and min difficulty continuing physical tasks while scanning.  Alternating Attention on Constant therapy (alternating alphabetizing words) with 84% accuracy.  Using symbol key to fill in corresponding numbers with good accuracy except 2 omissions (for visual scanning/perceptual skills with cognitive component).   Trail Making Part B with 1 omission.  Coping cube, house, and flower designs with overall good accuracy.  Clock drawing with good accuracy.  Checked remaining goals and discussed progress.  Compensation strategies for cognitive deficits to incr safety/accuracy.  Pt verbalized understanding.            OT Long Term Goals - 09/01/19 0826      OT LONG TERM GOAL #1   Title Pt will be independent with visual/cognitive HEP.--check LTGs 08/25/19)  Time 4    Period Weeks    Status Achieved      OT LONG TERM GOAL #2   Title Pt will be independent with coordination HEP.    Time 4    Period Weeks    Status Achieved      OT LONG TERM GOAL #3   Title Pt will perform environmental scanning in busy environment with at least 90% accuracy for incr safety in community.    Time 4    Period Weeks    Status Achieved   08/15/19:  100% accuracy     OT LONG TERM GOAL #4   Title Pt will be able to perform simple environmental scanning with divided attention with at least 85% accuracy for incr safety for driving/community activities.    Time 4    Period Weeks    Status Achieved   09/01/19:  met at approx this level     OT LONG TERM GOAL #5   Title Pt will improve coordination for ADLs as shown by improving time on 9-hole peg test by at least 7sec.    Baseline 33.93sec    Time 4    Period Weeks    Status Achieved   24.97sec                Plan - 09/01/19 6269    Clinical Impression Statement Pt continues to demo difficulty with divided attention, particularly with language-based tasks, but has met all LTGs and  is appropriate for d/c at this time    OT Occupational Profile and History Detailed Assessment- Review of Records and additional review of physical, cognitive, psychosocial history related to current functional performance    Occupational performance deficits (Please refer to evaluation for details): ADL's;IADL's;Leisure;Social Participation    Body Structure / Function / Physical Skills ADL;Vision;IADL;Sensation;Coordination;FMC;UE functional use;GMC;Proprioception    Cognitive Skills Attention;Memory;Perception;Safety Awareness    Rehab Potential Good    Clinical Decision Making Several treatment options, min-mod task modification necessary    Comorbidities Affecting Occupational Performance: May have comorbidities impacting occupational performance    Modification or Assistance to Complete Evaluation  Min-Moderate modification of tasks or assist with assess necessary to complete eval    OT Frequency 2x / week    OT Duration 4 weeks   +eval (or 8 visits over 6 weeks as schedule may be modified due to pt's upcoming Pacemaker surgery)   OT Treatment/Interventions Self-care/ADL training;DME and/or AE instruction;Therapeutic activities;Therapeutic exercise;Cognitive remediation/compensation;Visual/perceptual remediation/compensation;Neuromuscular education;Patient/family education    Plan d/c OT    Consulted and Agree with Plan of Care Patient           Patient will benefit from skilled therapeutic intervention in order to improve the following deficits and impairments:   Body Structure / Function / Physical Skills: ADL, Vision, IADL, Sensation, Coordination, FMC, UE functional use, GMC, Proprioception Cognitive Skills: Attention, Memory, Perception, Safety Awareness     Visit Diagnosis: Visuospatial deficit  Attention and concentration deficit  Neurologic neglect syndrome  Other lack of coordination  Other symptoms and signs involving cognitive functions following cerebral  infarction    Problem List Patient Active Problem List   Diagnosis Date Noted  . S/P AV nodal ablation 07/28/19 07/29/2019  . S/P placement of cardiac pacemaker MDT 07/28/19 07/29/2019  . AV block 07/28/2019  . CVA (cerebral vascular accident) ([place]) 06/16/2019  . Facial tingling 11/29/2018  . Tremor of left hand 11/29/2018  . Medicare annual wellness visit, subsequent 11/24/2018  . Dysuria 02/06/2018  .  Iron deficiency anemia 10/21/2017  . Rapid atrial fibrillation ([place]) 08/19/2017  . Constipation 08/02/2017  . Numbness and tingling 07/14/2017  . Paresthesia 07/14/2017  . Unilateral primary osteoarthritis, left hip 05/04/2017  . Status post total replacement of left hip 05/04/2017  . Hip osteoarthritis 04/27/2017  . Venous stasis dermatitis of both lower extremities 01/08/2017  . Impacted cerumen of right ear 11/20/2016  . Osteopenia 10/25/2016  . Pedal edema 08/26/2016  . Varicose veins of both lower extremities 08/26/2016  . Estrogen deficiency 08/26/2016  . Screening mammogram, encounter for 08/26/2016  . Hemorrhoids 08/26/2016  . History of nonmelanoma skin cancer 01/01/2016  . Hip pain 08/02/2014  . Left knee pain 08/02/2014  . Chronic cough 05/08/2014  . Caregiver stress 08/16/2013  . Colon cancer screening 08/16/2013  . Encounter for therapeutic drug monitoring 04/20/2013  . Left ovarian cyst 03/14/2013  . (HFpEF) heart failure with preserved ejection fraction ([place]) 12/27/2012  . Palpitations 04/15/2012  . Cardiomyopathy, secondary --Resolved again 10/14 10/13/2010  . COLONIC POLYPS, ADENOMATOUS, HX OF 09/18/2009  . PULMONARY NODULE 12/20/2008  . GANGLION CYST 10/04/2007  . Hyperlipidemia 04/27/2007  . Depression with anxiety 04/27/2007  . Asthma, mild intermittent 04/27/2007  . INSOMNIA 04/27/2007  . ADENOMATOUS COLONIC POLYP 11/04/2006  . Hypothyroidism 09/02/2006    OCCUPATIONAL THERAPY DISCHARGE SUMMARY  Visits from Start of Care: 8  Current functional  level related to goals / functional outcomes: See above   Remaining deficits: Difficulty with word finding, mental manipulation, divided attention   Education / Equipment: Pt instructed in coordination, cognitive, and visual HEP  and visual/cognitive compensation strategies.  Pt verbalized understanding of education provided.    Plan: Patient agrees to discharge.  Patient goals were met. Patient is being discharged due to meeting the stated rehab goals.  ?????        [facility] 09/01/2019, 9:30 AM  [facility] [address] [place], [place], [address] Phone: [phone]   Fax:  [phone]  Name: [name] MRN: [id number] Date of Birth: [birth year]   Vianne Bulls, OTR/L [facility] [address] [place], [place]  [id number] [phone] phone [phone] 09/01/19 9:30 AM

## 2019-09-13 ENCOUNTER — Other Ambulatory Visit: Payer: Self-pay

## 2019-09-13 MED ORDER — EZETIMIBE 10 MG PO TABS: 10.0000 mg | ORAL_TABLET | Freq: Every day | ORAL | 3 refills | Status: DC

## 2019-09-13 MED ORDER — ASPIRIN EC 81 MG PO TBEC: 81.0000 mg | DELAYED_RELEASE_TABLET | Freq: Every day | ORAL | 3 refills | Status: DC

## 2019-10-10 ENCOUNTER — Telehealth: Payer: Self-pay | Admitting: Internal Medicine

## 2019-10-10 NOTE — Telephone Encounter (Signed)
Patient is requesting to speak with Dr. Olin Pia nurse to inquire about whether or not she is eligible to begin [employer]. She would like to ensure that this program will not interfere with any of her medications. Please advise.

## 2019-10-11 NOTE — Telephone Encounter (Signed)
Spoke with pt who states she would like to go on weight reduction plan called [employer].  Pt would like to make sure it will not interfere with any of her medications.  Diet is a high soy plan and she is not sure about sodium content of foods.  Pt advised will forward information to our pharmacists but pt will also need to contact her PCP for clearance to begin weight loss program.  Pt verbalizes understanding and agrees with current plan.

## 2019-10-11 NOTE — Telephone Encounter (Signed)
Diet plan should not affect any of her medications. It looks like there are different food plans pt can choose from, so it's hard to see specific nutritional information. Agree she should touch base with her PCP.  She should look for a plan that has < 2,000mg  of sodium daily (has hx of HFpEF) and that is lower in saturated fat (has hx of CVA). Her LDL is actually elevated at 169 and her goal is < 70 due to history of CVA...would also see if pt is willing to have appt in lipid clinic to discuss non-statin medication options like PCSK9i therapy (has rash allergy listed to statins and is currently on ezetimibe).

## 2019-10-12 NOTE — Telephone Encounter (Signed)
[name] is returning [employer] call.

## 2019-10-12 NOTE — Telephone Encounter (Signed)
Attempted phone call to pt.  Left voicemail message to contact RN at [phone]. 

## 2019-10-17 ENCOUNTER — Telehealth: Payer: Self-pay | Admitting: Internal Medicine

## 2019-10-17 NOTE — Telephone Encounter (Signed)
RN has spoken with pt regarding Pharm-D's recommendation for weight loss plan.  See previous encounter for full details.

## 2019-10-17 NOTE — Telephone Encounter (Signed)
Spoke with pt and advised of recommendations per [facility] for weight loss plan.  Pt verbalizes understanding and had no further questions at this time.

## 2019-10-17 NOTE — Telephone Encounter (Signed)
Patient states she is returning a call to [place]. However, she does not know what the call may have been in regards to.

## 2019-10-18 ENCOUNTER — Telehealth: Payer: Self-pay

## 2019-10-18 NOTE — Telephone Encounter (Signed)
[name] - Client Nonclinical Telephone Record AccessNurse Client [facility] Night - Client Client Site [place] Physician [name] - MD Contact Type Call Who Is Calling Patient / Member / Family / Caregiver Caller Name [place] Phone Number [phone] Patient Name [name] Patient DOB [birth year] Call Type Message Only Information Provided Reason for Call Request for General Office Information Initial Comment Caller states she is calling to check with Dr. Glori Bickers if it is okay to start a new low calorie diet. Caller states the diet is called Optavia. Additional Comment Verified the office hours with the caller and suggested to the caller to call back when the office reopens to confirm the message has been received. Caller requests a call back from the office. Disp. Time Disposition Final User 10/17/2019 5:01:57 PM General Information Provided Yes [place] [facility] Call Closed By: Rachel Moulds [place] [employer] Date/Time: 10/17/2019 4:58:43 PM (ET)

## 2019-10-18 NOTE — Telephone Encounter (Signed)
Pt notified of Dr. Tower's comments and verbalized understanding  

## 2019-10-18 NOTE — Telephone Encounter (Signed)
Per chart review tab last seen 11/29/2018.

## 2019-10-18 NOTE — Telephone Encounter (Signed)
Avoid drugs/herbs appetite suppressants  Low calorie diet should be ok

## 2019-10-18 NOTE — Telephone Encounter (Signed)
I left a message letting the pt know I rescheduled her appointment for 11/01/2019. If she has any questions she can call on my direct office number.

## 2019-10-18 NOTE — Telephone Encounter (Signed)
-----   Message from Thora Lance, RN sent at 10/17/2019  4:59 PM EDT ----- Regarding: home remote check 10/27/2019 Pt will be out of town from 10/22/2019-10/29/2019 and would like to reschedule home remote pacer check.  Thanks,  Rosann Auerbach

## 2019-10-27 ENCOUNTER — Ambulatory Visit (INDEPENDENT_AMBULATORY_CARE_PROVIDER_SITE_OTHER): Payer: Medicare Other | Admitting: *Deleted

## 2019-10-27 DIAGNOSIS — I4430 Unspecified atrioventricular block: Secondary | ICD-10-CM | POA: Diagnosis not present

## 2019-10-27 LAB — CUP PACEART REMOTE DEVICE CHECK
Battery Remaining Longevity: 120 mo
Battery Voltage: 3.2 V
Brady Statistic AP VP Percent: 0 %
Brady Statistic AP VS Percent: 0 %
Brady Statistic AS VP Percent: 98.52 %
Brady Statistic AS VS Percent: 1.48 %
Brady Statistic RA Percent Paced: 0 %
Brady Statistic RV Percent Paced: 98.52 %
Date Time Interrogation Session: 20210805190301
Implantable Lead Implant Date: 20210507
Implantable Lead Location: 753860
Implantable Lead Model: 5076
Implantable Pulse Generator Implant Date: 20210507
Lead Channel Impedance Value: 3306 Ohm
Lead Channel Impedance Value: 3306 Ohm
Lead Channel Impedance Value: 494 Ohm
Lead Channel Impedance Value: 665 Ohm
Lead Channel Pacing Threshold Amplitude: 0.625 V
Lead Channel Pacing Threshold Pulse Width: 0.4 ms
Lead Channel Sensing Intrinsic Amplitude: 1.875 mV
Lead Channel Sensing Intrinsic Amplitude: 14.875 mV
Lead Channel Sensing Intrinsic Amplitude: 14.875 mV
Lead Channel Setting Pacing Amplitude: 3.5 V
Lead Channel Setting Pacing Pulse Width: 0.4 ms
Lead Channel Setting Sensing Sensitivity: 4 mV

## 2019-10-30 NOTE — Progress Notes (Signed)
Remote pacemaker transmission.   

## 2019-10-31 ENCOUNTER — Ambulatory Visit (INDEPENDENT_AMBULATORY_CARE_PROVIDER_SITE_OTHER): Payer: Medicare Other | Admitting: Internal Medicine

## 2019-10-31 ENCOUNTER — Other Ambulatory Visit: Payer: Self-pay

## 2019-10-31 ENCOUNTER — Encounter: Payer: Self-pay | Admitting: Internal Medicine

## 2019-10-31 VITALS — BP 122/78 | HR 77 | Ht 65.0 in | Wt 184.0 lb

## 2019-10-31 DIAGNOSIS — Z9889 Other specified postprocedural states: Secondary | ICD-10-CM | POA: Diagnosis not present

## 2019-10-31 DIAGNOSIS — I639 Cerebral infarction, unspecified: Secondary | ICD-10-CM

## 2019-10-31 DIAGNOSIS — I4891 Unspecified atrial fibrillation: Secondary | ICD-10-CM | POA: Diagnosis not present

## 2019-10-31 DIAGNOSIS — Z950 Presence of cardiac pacemaker: Secondary | ICD-10-CM

## 2019-10-31 NOTE — Patient Instructions (Signed)
Medication Instructions: Your physician has recommended you make the following change in your medication:   ** Stop Aspirin    *If you need a refill on your cardiac medications before your next appointment, please call your pharmacy*   Lab Work: None ordered.  If you have labs (blood work) drawn today and your tests are completely normal, you will receive your results only by: [name] MyChart Message (if you have MyChart) OR . A paper copy in the mail If you have any lab test that is abnormal or we need to change your treatment, we will call you to review the results.   Testing/Procedures: None ordered.    Follow-Up: At [facility], you and your health needs are our priority.  As part of our continuing mission to provide you with exceptional heart care, we have created designated Provider Care Teams.  These Care Teams include your primary Cardiologist (physician) and Advanced Practice Providers (APPs -  Physician Assistants and Nurse Practitioners) who all work together to provide you with the care you need, when you need it.  We recommend signing up for the patient portal called "MyChart".  Sign up information is provided on this After Visit Summary.  MyChart is used to connect with patients for Virtual Visits (Telemedicine).  Patients are able to view lab/test results, encounter notes, upcoming appointments, etc.  Non-urgent messages can be sent to your provider as well.   To learn more about what you can do with MyChart, go to [email or web address].    Your next appointment:   9 month(s)  The format for your next appointment:   In Person  Provider:  Dr Caryl Comes

## 2019-10-31 NOTE — Progress Notes (Signed)
Electrophysiology Office Note   Date:  10/31/2019   ID:  [name], Key 13-Jun-1932, MRN [id number]  PCP:  [name] Greenspan, MD  Cardiologist:   Primary Electrophysiologist:  [name] Axe, MD    No chief complaint on file.    History of Present Illness: [name] is a 86 y.o. female seen in followup electrophysiology evaluation for atrial fibrillation.    She has hx of atrial flutter ablation related to prior atriotomy undertaken at [facility] in March 7408 complicated by recurrent atrial arrhythmias prompting initiation of Tikosyn  This was stopped fall 2012 Recurrent symptoms prompted [name] to reinitiate Tikosyn.  Shes had recurrent episodes of atrial fibrillation associated with a rapid ventricular response.   She underwent cardioversion and reverted again to atrial fibrillation; it is now permanent She has decided to remain on warfarin  Stroke 2/21.  Subtherapeutic INR.  Switch to Eliquis.  Aspirin also added adjunctively.  Significant bruising  Ultimately underwent AV ablation and pacing.  Energy level is significantly improved.  No edema.  Occasional intermittent chest discomfort.    DATE TEST EF   10/14 Echo   55-60 %   2/17 Echo   50-55 %   9/18 Echo  55-65%   3/21 TEE 55-65%       Date Cr TSH Hgb  6/18    13.9  2/19 0.74  9.8  1/20 0.83 4.06(11/19) 12.6  6/20 0.84 5.04 12.1  8/20  3.74   5/21 0.84 3.81 12.8    Past Medical History:  Diagnosis Date  . Allergic rhinitis   . 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 Arthritis   . Atrial fibrillation -persistent cardiologist-  dr [name]/  primary EP -- dr [name] ([facility])   a. s/p PVI [name] 2010;  b. on tikosyn/coumadin;  c. 05/2009 Echo: EF 60-65%, Gr 2 DD. (first dx 09/ 2007)  . Bilateral lower extremity edema   . Bleeding hemorrhoid   . Carotid stenosis    mild (hosp 3/11)- consult by vasc/ Dr Donnetta Hutching  . Complication of anesthesia    hard to wake  . Diverticulosis of colon   . Dyspnea    on exertion-climbing  stairs  . Fatty liver   . H/O cardiac radiofrequency ablation    01/ 2008 at [facility] /  03/ 2010  at [facility]  . Heart failure with preserved ejection fraction ([place])   . History of adenomatous polyp of colon    tubular adenoma's  . History of cardiomyopathy    secondary tachycardia-induced cardiomyopathy -- resolved 2014  . History of squamous cell carcinoma in situ (SCCIS) of skin    05/ 2017  nasal bridge and right medial knee  . History of transient ischemic attack (TIA)    01-24-2005 and 06-12-2009  . Hyperlipidemia   . Hypothyroidism   . Mild intermittent asthma    reacts to cats  . Mixed stress and urge urinary incontinence   . Pulmonary nodule   . S/P AV nodal ablation 07/28/19 07/29/2019  . S/P mitral valve repair 10-23-1998  dr Boyce Medici at [facility]   for MVP and regurg. (annuloplasty ring procedure)  . S/P placement of cardiac pacemaker MDT 07/28/19 07/29/2019   Past Surgical History:  Procedure Laterality Date  . APPENDECTOMY  1978  . AV NODE ABLATION N/A 07/28/2019   Procedure: AV NODE ABLATION;  Surgeon: [name] Sprang, MD;  Location: [employer] CV LAB;  Service: Cardiovascular;  Laterality: N/A;  .  BUBBLE STUDY  06/19/2019   Procedure: BUBBLE STUDY;  Surgeon: [name] Casino, MD;  Location: [facility] ENDOSCOPY;  Service: Cardiovascular;;  . CARDIAC ELECTROPHYSIOLOGY [place]  01/ 2008    at [place]   right-sided ablation atrial flutter  . CARDIAC ELECTROPHYSIOLOGY STUDY AND ABLATION  03/ 2010   dr Jaymes Graff at [facility]   AV node ablation and pulmonary vein isolation for atrial fib  . CARDIOVERSION  06-18-2006;  07-13-2006;  10-19-2010;  10-27-2010  . COLONOSCOPY    . COLONOSCOPY WITH PROPOFOL N/A 10/13/2017   Procedure: COLONOSCOPY WITH PROPOFOL;  Surgeon: [name] Bellows, MD;  Location: [facility] ENDOSCOPY;  Service: Gastroenterology;  Laterality: N/A;  . CYSTO/ TRANSURETHRAL COLLAGEN INJECTION THERAPY  07-26-2007   dr Matilde Key  . DILATION AND  CURETTAGE OF UTERUS    . ESOPHAGOGASTRODUODENOSCOPY (EGD) WITH PROPOFOL N/A 10/13/2017   Procedure: ESOPHAGOGASTRODUODENOSCOPY (EGD) WITH PROPOFOL;  Surgeon: [name] Bellows, MD;  Location: [facility] ENDOSCOPY;  Service: Gastroenterology;  Laterality: N/A;  . EXCISIONAL HEMORRHOIDECTOMY  1980s  . GIVENS CAPSULE STUDY N/A 12/08/2017   Procedure: GIVENS CAPSULE STUDY;  Surgeon: [name] Bellows, MD;  Location: [facility] ENDOSCOPY;  Service: Gastroenterology;  Laterality: N/A;  . HEMORRHOID SURGERY N/A 10/29/2016   Procedure: HEMORRHOIDECTOMY;  Surgeon: [name] Ruff, MD;  Location: [facility];  Service: General;  Laterality: N/A;  . MITRAL VALVE ANNULOPLASTY  10/23/1998   "Model [id number]; [employer] [id number]"; size 60mm; [facility]; Dr. Boyce Medici  . PACEMAKER IMPLANT N/A 07/28/2019   Procedure: PACEMAKER IMPLANT;  Surgeon: [name] Sprang, MD;  Location: [place] CV LAB;  Service: Cardiovascular;  Laterality: N/A;  . [place]  . TEE WITH CARDIOVERSION  05-06-2006 at [facility];  01-02-2013 at [facility]  . TEE WITHOUT CARDIOVERSION N/A 06/19/2019   Procedure: TRANSESOPHAGEAL ECHOCARDIOGRAM (TEE);  Surgeon: [name] Casino, MD;  Location: [facility] ENDOSCOPY;  Service: Cardiovascular;  Laterality: N/A;  . TOTAL HIP ARTHROPLASTY Left 05/04/2017   Procedure: LEFT TOTAL HIP ARTHROPLASTY ANTERIOR APPROACH;  Surgeon: [name] Rossetti, MD;  Location: [place];  Service: Orthopedics;  Laterality: Left;  . TRANSTHORACIC ECHOCARDIOGRAM  05-01-2015   dr [name]   ef 50-55%/  mild AV sclerosis without stenosis/  post MV repair with mild central MR (valve area by pressure half-time 2cm^2,  valve area by continutity equation 0.91cm^2, peak grandiant 4mmHg)/  severe LAE/ mild TR/ mild RAE   . TUBAL LIGATION Bilateral 1978     Current Outpatient Medications  Medication Sig Dispense Refill  . acetaminophen (TYLENOL) 325 MG tablet Take 325 mg by mouth every 6 (six) hours as needed for moderate pain or headache.    .  albuterol (PROVENTIL HFA;VENTOLIN HFA) 108 (90 Base) MCG/ACT inhaler Inhale 2 puffs into the lungs every 4 (four) hours as needed for wheezing or shortness of breath. 1 Inhaler 0  . alendronate (FOSAMAX) 70 MG tablet TAKE 1 TABLET EVERY 7 DAYS. TAKE WITH A FULL GLASS OF WATER ON AN EMPTY STOMACH. 12 tablet 1  . apixaban (ELIQUIS) 5 MG TABS tablet Take 1 tablet (5 mg total) by mouth 2 (two) times daily. 180 tablet 1  . aspirin EC 81 MG tablet Take 1 tablet (81 mg total) by mouth daily. 90 tablet 3  . diclofenac Sodium (VOLTAREN) 1 % GEL Apply 1 application topically 4 (four) times daily as needed (pain).    [name] diphenhydrAMINE (BENADRYL) 25 mg capsule Take 50 mg by mouth every 6 (six) hours as needed for itching.    [name]  diphenhydrAMINE-zinc acetate (BENADRYL) cream Apply 1 application topically 3 (three) times daily as needed for itching.    . ezetimibe (ZETIA) 10 MG tablet Take 1 tablet (10 mg total) by mouth daily. 90 tablet 3  . ferrous sulfate 325 (65 FE) MG EC tablet Take 1 tablet (325 mg total) by mouth daily with breakfast. 90 tablet 3  . fluticasone (FLONASE) 50 MCG/ACT nasal spray Place 1 spray into both nostrils daily as needed for allergies.    . furosemide (LASIX) 40 MG tablet Take 40 mg by mouth daily as needed for edema.    [name] levothyroxine (SYNTHROID) 25 MCG tablet Take 1 tablet (25 mcg total) by mouth daily before breakfast. 90 tablet 3  . loratadine (CLARITIN) 10 MG tablet Take 10 mg by mouth daily as needed for allergies.     . metoprolol tartrate (LOPRESSOR) 25 MG tablet Take 1 tablet (25 mg total) by mouth daily. 08-28-19 FOR 3 DAYS THEN STOP 90 tablet 1  . Polyethyl Glycol-Propyl Glycol (LUBRICANT EYE DROPS) 0.4-0.3 % SOLN Place 1-2 drops into both eyes 3 (three) times daily as needed (for dry eyes.).     No current facility-administered medications for this visit.    Allergies:   Amiodarone hcl, Penicillins, and Statins   Social History:  The patient  reports that she has never  smoked. She has never used smokeless tobacco. She reports current alcohol use. She reports that she does not use drugs.   Family History:  The patient's family history includes Alcohol abuse in her father; Cancer in her father; Lung cancer in her father.    ROS:  Please see the history of present illness.  .   All other systems are reviewed and negative.    PHYSICAL EXAM: VS:  BP 122/78   Pulse 77   Ht 5\' 5"  (1.651 m)   Wt 184 lb (83.5 kg)   SpO2 98%   BMI 30.62 kg/m  , BMI Body mass index is 30.62 kg/m. Well developed and well nourished in no acute distress HENT normal Neck supple with JVP-flat Clear Device pocket well healed; without hematoma or erythema.  There is no tethering  Regular rate and rhythm, no   murmur Abd-soft with active BS No Clubbing cyanosis   edema Skin-warm and dry some ecchymosis A & Oriented  Grossly normal sensory and motor function  ECG atrial fibrillation with complete heart block.  Ventricular pacing at 77. Intervals-/18/46         Wt Readings from Last 3 Encounters:  10/31/19 184 lb (83.5 kg)  08/28/19 190 lb (86.2 kg)  07/29/19 193 lb 12.6 oz (87.9 kg)      Other studies Reviewed: Additional studies/ records that were reviewed today include: labs as noted  Review of the above records today demonstrates:    ASSESSMENT AND PLAN: Atrial fibrillation-permanent  AV junction ablation  Pacemaker-[employer]onic  Hypothyroidism-treated     Dypsnea on exertion   Anemia   Obesity   Functional status significantly improved.  Heart rate excursion adequate.  Hemoglobin stable.  Continue iron.  We will discontinue aspirin at this time.  Continue Eliquis.            Signed, [name] Axe, MD  10/31/2019 2:09 PM     [place] [address] [phone] (office) [phone] (fax)

## 2019-11-01 NOTE — Addendum Note (Signed)
Addended by: Jacinta Shoe on: 11/01/2019 04:34 PM   Modules accepted: Orders

## 2019-11-26 ENCOUNTER — Other Ambulatory Visit: Payer: Self-pay | Admitting: Family Medicine

## 2019-12-06 ENCOUNTER — Other Ambulatory Visit: Payer: Self-pay | Admitting: Family Medicine

## 2019-12-06 NOTE — Telephone Encounter (Signed)
I spoke to patient and [place]  Patient said she'll call back to schedule appointment.

## 2019-12-06 NOTE — Telephone Encounter (Signed)
Please schedule f/u or PE and refill until then 

## 2019-12-06 NOTE — Telephone Encounter (Signed)
Pt hasn't been seen in over a year and no future appts. Please advise

## 2019-12-06 NOTE — Telephone Encounter (Signed)
Med refilled once and Carrie will reach out to pt to try and get appt scheduled  

## 2019-12-13 ENCOUNTER — Other Ambulatory Visit: Payer: Self-pay | Admitting: Internal Medicine

## 2019-12-13 NOTE — Telephone Encounter (Signed)
Eliquis 5mg  refill request received. Patient is 86 years old, weight-83.5kg, Crea-0.84 on 07/25/19, Diagnosis-Afib, and last seen by Dr.Klein on 10/31/19. Dose is appropriate based on dosing criteria. Will send in refill to requested pharmacy.

## 2019-12-19 DIAGNOSIS — H2513 Age-related nuclear cataract, bilateral: Secondary | ICD-10-CM | POA: Diagnosis not present

## 2019-12-19 DIAGNOSIS — H5203 Hypermetropia, bilateral: Secondary | ICD-10-CM | POA: Diagnosis not present

## 2019-12-19 DIAGNOSIS — H25013 Cortical age-related cataract, bilateral: Secondary | ICD-10-CM | POA: Diagnosis not present

## 2019-12-19 DIAGNOSIS — H524 Presbyopia: Secondary | ICD-10-CM | POA: Diagnosis not present

## 2020-01-26 ENCOUNTER — Ambulatory Visit (INDEPENDENT_AMBULATORY_CARE_PROVIDER_SITE_OTHER): Payer: Medicare Other

## 2020-01-26 DIAGNOSIS — I4891 Unspecified atrial fibrillation: Secondary | ICD-10-CM | POA: Diagnosis not present

## 2020-01-26 LAB — CUP PACEART REMOTE DEVICE CHECK
Battery Remaining Longevity: 139 mo
Battery Voltage: 3.17 V
Brady Statistic AP VP Percent: 0 %
Brady Statistic AP VS Percent: 0 %
Brady Statistic AS VP Percent: 96.98 %
Brady Statistic AS VS Percent: 3.02 %
Brady Statistic RA Percent Paced: 0 %
Brady Statistic RV Percent Paced: 96.98 %
Date Time Interrogation Session: 20211105000853
Implantable Lead Implant Date: 20210507
Implantable Lead Location: 753860
Implantable Lead Model: 5076
Implantable Pulse Generator Implant Date: 20210507
Lead Channel Impedance Value: 3306 Ohm
Lead Channel Impedance Value: 3306 Ohm
Lead Channel Impedance Value: 494 Ohm
Lead Channel Impedance Value: 589 Ohm
Lead Channel Pacing Threshold Amplitude: 0.625 V
Lead Channel Pacing Threshold Pulse Width: 0.4 ms
Lead Channel Sensing Intrinsic Amplitude: 1.875 mV
Lead Channel Sensing Intrinsic Amplitude: 8.75 mV
Lead Channel Sensing Intrinsic Amplitude: 8.75 mV
Lead Channel Setting Pacing Amplitude: 2.5 V
Lead Channel Setting Pacing Pulse Width: 0.4 ms
Lead Channel Setting Sensing Sensitivity: 4 mV

## 2020-01-26 NOTE — Progress Notes (Signed)
Remote pacemaker transmission.   

## 2020-02-14 ENCOUNTER — Other Ambulatory Visit: Payer: Self-pay | Admitting: Family Medicine

## 2020-02-18 ENCOUNTER — Emergency Department: Payer: Medicare Other

## 2020-02-18 ENCOUNTER — Inpatient Hospital Stay
Admission: EM | Admit: 2020-02-18 | Discharge: 2020-02-21 | DRG: 378 | Disposition: A | Discharge status: Home / Self Care | Payer: Medicare Other | Attending: Internal Medicine | Admitting: Internal Medicine

## 2020-02-18 ENCOUNTER — Other Ambulatory Visit: Payer: Self-pay

## 2020-02-18 DIAGNOSIS — Z888 Allergy status to other drugs, medicaments and biological substances status: Secondary | ICD-10-CM

## 2020-02-18 DIAGNOSIS — Z7902 Long term (current) use of antithrombotics/antiplatelets: Secondary | ICD-10-CM

## 2020-02-18 DIAGNOSIS — Z8052 Family history of malignant neoplasm of bladder: Secondary | ICD-10-CM

## 2020-02-18 DIAGNOSIS — K760 Fatty (change of) liver, not elsewhere classified: Secondary | ICD-10-CM | POA: Diagnosis present

## 2020-02-18 DIAGNOSIS — Z860100 Personal history of colon polyps, unspecified: Secondary | ICD-10-CM

## 2020-02-18 DIAGNOSIS — K922 Gastrointestinal hemorrhage, unspecified: Secondary | ICD-10-CM | POA: Diagnosis not present

## 2020-02-18 DIAGNOSIS — K5730 Diverticulosis of large intestine without perforation or abscess without bleeding: Secondary | ICD-10-CM | POA: Diagnosis present

## 2020-02-18 DIAGNOSIS — K625 Hemorrhage of anus and rectum: Secondary | ICD-10-CM | POA: Diagnosis not present

## 2020-02-18 DIAGNOSIS — Z79899 Other long term (current) drug therapy: Secondary | ICD-10-CM | POA: Diagnosis not present

## 2020-02-18 DIAGNOSIS — E785 Hyperlipidemia, unspecified: Secondary | ICD-10-CM | POA: Diagnosis present

## 2020-02-18 DIAGNOSIS — K921 Melena: Secondary | ICD-10-CM | POA: Diagnosis present

## 2020-02-18 DIAGNOSIS — Z9889 Other specified postprocedural states: Secondary | ICD-10-CM

## 2020-02-18 DIAGNOSIS — I5032 Chronic diastolic (congestive) heart failure: Secondary | ICD-10-CM | POA: Diagnosis present

## 2020-02-18 DIAGNOSIS — I4891 Unspecified atrial fibrillation: Secondary | ICD-10-CM | POA: Diagnosis not present

## 2020-02-18 DIAGNOSIS — K635 Polyp of colon: Secondary | ICD-10-CM | POA: Diagnosis present

## 2020-02-18 DIAGNOSIS — Z79890 Hormone replacement therapy (postmenopausal): Secondary | ICD-10-CM | POA: Diagnosis not present

## 2020-02-18 DIAGNOSIS — Z880 Allergy status to penicillin: Secondary | ICD-10-CM | POA: Diagnosis not present

## 2020-02-18 DIAGNOSIS — I6529 Occlusion and stenosis of unspecified carotid artery: Secondary | ICD-10-CM | POA: Diagnosis present

## 2020-02-18 DIAGNOSIS — Z20822 Contact with and (suspected) exposure to covid-19: Secondary | ICD-10-CM | POA: Diagnosis present

## 2020-02-18 DIAGNOSIS — E039 Hypothyroidism, unspecified: Secondary | ICD-10-CM | POA: Diagnosis present

## 2020-02-18 DIAGNOSIS — R0689 Other abnormalities of breathing: Secondary | ICD-10-CM | POA: Diagnosis not present

## 2020-02-18 DIAGNOSIS — D123 Benign neoplasm of transverse colon: Secondary | ICD-10-CM | POA: Diagnosis not present

## 2020-02-18 DIAGNOSIS — Z96642 Presence of left artificial hip joint: Secondary | ICD-10-CM | POA: Diagnosis present

## 2020-02-18 DIAGNOSIS — D126 Benign neoplasm of colon, unspecified: Secondary | ICD-10-CM | POA: Diagnosis not present

## 2020-02-18 DIAGNOSIS — M1990 Unspecified osteoarthritis, unspecified site: Secondary | ICD-10-CM | POA: Diagnosis present

## 2020-02-18 DIAGNOSIS — F418 Other specified anxiety disorders: Secondary | ICD-10-CM | POA: Diagnosis not present

## 2020-02-18 DIAGNOSIS — Z950 Presence of cardiac pacemaker: Secondary | ICD-10-CM | POA: Diagnosis not present

## 2020-02-18 DIAGNOSIS — I700 Atherosclerosis of aorta: Secondary | ICD-10-CM | POA: Diagnosis not present

## 2020-02-18 DIAGNOSIS — J4520 Mild intermittent asthma, uncomplicated: Secondary | ICD-10-CM | POA: Diagnosis present

## 2020-02-18 DIAGNOSIS — K861 Other chronic pancreatitis: Secondary | ICD-10-CM | POA: Diagnosis not present

## 2020-02-18 DIAGNOSIS — R531 Weakness: Secondary | ICD-10-CM | POA: Diagnosis not present

## 2020-02-18 DIAGNOSIS — Z811 Family history of alcohol abuse and dependence: Secondary | ICD-10-CM | POA: Diagnosis not present

## 2020-02-18 DIAGNOSIS — I482 Chronic atrial fibrillation, unspecified: Secondary | ICD-10-CM

## 2020-02-18 DIAGNOSIS — R29810 Facial weakness: Secondary | ICD-10-CM | POA: Diagnosis not present

## 2020-02-18 DIAGNOSIS — R58 Hemorrhage, not elsewhere classified: Secondary | ICD-10-CM | POA: Diagnosis not present

## 2020-02-18 DIAGNOSIS — I5030 Unspecified diastolic (congestive) heart failure: Secondary | ICD-10-CM | POA: Diagnosis present

## 2020-02-18 DIAGNOSIS — N3946 Mixed incontinence: Secondary | ICD-10-CM | POA: Diagnosis present

## 2020-02-18 DIAGNOSIS — K649 Unspecified hemorrhoids: Secondary | ICD-10-CM | POA: Diagnosis not present

## 2020-02-18 DIAGNOSIS — Z8673 Personal history of transient ischemic attack (TIA), and cerebral infarction without residual deficits: Secondary | ICD-10-CM

## 2020-02-18 DIAGNOSIS — K5790 Diverticulosis of intestine, part unspecified, without perforation or abscess without bleeding: Secondary | ICD-10-CM | POA: Diagnosis not present

## 2020-02-18 DIAGNOSIS — K8689 Other specified diseases of pancreas: Secondary | ICD-10-CM | POA: Diagnosis not present

## 2020-02-18 DIAGNOSIS — I509 Heart failure, unspecified: Secondary | ICD-10-CM | POA: Diagnosis not present

## 2020-02-18 DIAGNOSIS — K449 Diaphragmatic hernia without obstruction or gangrene: Secondary | ICD-10-CM | POA: Diagnosis not present

## 2020-02-18 DIAGNOSIS — K640 First degree hemorrhoids: Secondary | ICD-10-CM | POA: Diagnosis present

## 2020-02-18 DIAGNOSIS — Z7901 Long term (current) use of anticoagulants: Secondary | ICD-10-CM

## 2020-02-18 LAB — RESP PANEL BY RT-PCR (FLU A&B, COVID) ARPGX2
Influenza A by PCR: NEGATIVE
Influenza B by PCR: NEGATIVE
SARS Coronavirus 2 by RT PCR: NEGATIVE

## 2020-02-18 LAB — CBC WITH DIFFERENTIAL/PLATELET
Abs Immature Granulocytes: 0.03 10*3/uL (ref 0.00–0.07)
Basophils Absolute: 0 10*3/uL (ref 0.0–0.1)
Basophils Relative: 1 %
Eosinophils Absolute: 0.2 10*3/uL (ref 0.0–0.5)
Eosinophils Relative: 3 %
HCT: 41.3 % (ref 36.0–46.0)
Hemoglobin: 13.2 g/dL (ref 12.0–15.0)
Immature Granulocytes: 1 %
Lymphocytes Relative: 27 %
Lymphs Abs: 1.7 10*3/uL (ref 0.7–4.0)
MCH: 28 pg (ref 26.0–34.0)
MCHC: 32 g/dL (ref 30.0–36.0)
MCV: 87.5 fL (ref 80.0–100.0)
Monocytes Absolute: 0.6 10*3/uL (ref 0.1–1.0)
Monocytes Relative: 9 %
Neutro Abs: 3.8 10*3/uL (ref 1.7–7.7)
Neutrophils Relative %: 59 %
Platelets: 160 10*3/uL (ref 150–400)
RBC: 4.72 MIL/uL (ref 3.87–5.11)
RDW: 14.8 % (ref 11.5–15.5)
WBC: 6.3 10*3/uL (ref 4.0–10.5)
nRBC: 0 % (ref 0.0–0.2)

## 2020-02-18 LAB — PROTIME-INR
INR: 1.5 — ABNORMAL HIGH (ref 0.8–1.2)
Prothrombin Time: 17.1 seconds — ABNORMAL HIGH (ref 11.4–15.2)

## 2020-02-18 LAB — CBC
HCT: 36.7 % (ref 36.0–46.0)
HCT: 38.5 % (ref 36.0–46.0)
Hemoglobin: 11.7 g/dL — ABNORMAL LOW (ref 12.0–15.0)
Hemoglobin: 12.1 g/dL (ref 12.0–15.0)
MCH: 28.1 pg (ref 26.0–34.0)
MCH: 27.6 pg (ref 26.0–34.0)
MCHC: 31.9 g/dL (ref 30.0–36.0)
MCHC: 31.4 g/dL (ref 30.0–36.0)
MCV: 88 fL (ref 80.0–100.0)
MCV: 87.7 fL (ref 80.0–100.0)
Platelets: 136 10*3/uL — ABNORMAL LOW (ref 150–400)
Platelets: 147 10*3/uL — ABNORMAL LOW (ref 150–400)
RBC: 4.17 MIL/uL (ref 3.87–5.11)
RBC: 4.39 MIL/uL (ref 3.87–5.11)
RDW: 14.8 % (ref 11.5–15.5)
RDW: 14.8 % (ref 11.5–15.5)
WBC: 4.4 10*3/uL (ref 4.0–10.5)
WBC: 4.2 10*3/uL (ref 4.0–10.5)
nRBC: 0 % (ref 0.0–0.2)
nRBC: 0 % (ref 0.0–0.2)

## 2020-02-18 LAB — COMPREHENSIVE METABOLIC PANEL
ALT: 24 U/L (ref 0–44)
AST: 35 U/L (ref 15–41)
Albumin: 4 g/dL (ref 3.5–5.0)
Alkaline Phosphatase: 43 U/L (ref 38–126)
Anion gap: 11 (ref 5–15)
BUN: 36 mg/dL — ABNORMAL HIGH (ref 8–23)
CO2: 26 mmol/L (ref 22–32)
Calcium: 9.3 mg/dL (ref 8.9–10.3)
Chloride: 106 mmol/L (ref 98–111)
Creatinine, Ser: 0.8 mg/dL (ref 0.44–1.00)
GFR, Estimated: >60 mL/min (ref >60)
Glucose, Bld: 110 mg/dL — ABNORMAL HIGH (ref 70–99)
Potassium: 3.9 mmol/L (ref 3.5–5.1)
Sodium: 143 mmol/L (ref 135–145)
Total Bilirubin: 0.7 mg/dL (ref 0.3–1.2)
Total Protein: 7.5 g/dL (ref 6.5–8.1)

## 2020-02-18 LAB — APTT: aPTT: 37 seconds — ABNORMAL HIGH (ref 24–36)

## 2020-02-18 LAB — LACTIC ACID, PLASMA: Lactic Acid, Venous: 1.1 mmol/L (ref 0.5–1.9)

## 2020-02-18 LAB — ABO/RH: ABO/RH(D): O POS

## 2020-02-18 MED ORDER — IOHEXOL 350 MG/ML SOLN
100.0000 mL | Freq: Once | INTRAVENOUS | Status: AC | PRN
  Administered 2020-02-18: 100 mL via INTRAVENOUS

## 2020-02-18 MED ORDER — POLYETHYL GLYCOL-PROPYL GLYCOL 0.4-0.3 % OP SOLN: 1.0000 [drp] | Freq: Three times a day (TID) | OPHTHALMIC | Status: DC | PRN

## 2020-02-18 MED ORDER — FLUTICASONE PROPIONATE 50 MCG/ACT NA SUSP
1.0000 | Freq: Every day | NASAL | Status: DC | PRN
  Filled 2020-02-18: qty 16

## 2020-02-18 MED ORDER — SODIUM CHLORIDE 0.9 % IV SOLN: 10.0000 mL/h | Freq: Once | INTRAVENOUS | Status: DC

## 2020-02-18 MED ORDER — EZETIMIBE 10 MG PO TABS
10.0000 mg | ORAL_TABLET | Freq: Every day | ORAL | Status: DC
  Administered 2020-02-19 – 2020-02-21 (×2): 10 mg via ORAL
  Filled 2020-02-18 (×3): qty 1

## 2020-02-18 MED ORDER — MORPHINE SULFATE (PF) 2 MG/ML IV SOLN
2.0000 mg | INTRAVENOUS | Status: DC | PRN
  Administered 2020-02-18 – 2020-02-20 (×2): 2 mg via INTRAVENOUS
  Filled 2020-02-18 (×2): qty 1

## 2020-02-18 MED ORDER — ONDANSETRON HCL 4 MG PO TABS: 4.0000 mg | ORAL_TABLET | Freq: Four times a day (QID) | ORAL | Status: DC | PRN

## 2020-02-18 MED ORDER — LEVOTHYROXINE SODIUM 25 MCG PO TABS
25.0000 ug | ORAL_TABLET | Freq: Every day | ORAL | Status: DC
  Administered 2020-02-19 – 2020-02-21 (×3): 25 ug via ORAL
  Filled 2020-02-18 (×3): qty 1

## 2020-02-18 MED ORDER — ONDANSETRON HCL 4 MG/2ML IJ SOLN: 4.0000 mg | Freq: Four times a day (QID) | INTRAMUSCULAR | Status: DC | PRN

## 2020-02-18 MED ORDER — SODIUM CHLORIDE 0.9 % IV SOLN: INTRAVENOUS | Status: DC

## 2020-02-18 MED ORDER — POLYVINYL ALCOHOL 1.4 % OP SOLN
1.0000 [drp] | Freq: Three times a day (TID) | OPHTHALMIC | Status: DC | PRN
  Filled 2020-02-18: qty 15

## 2020-02-18 NOTE — ED Triage Notes (Signed)
Pt arrives [facility] from home w cc of rectal bleeding. Pt states it started 3 days ago and has soaked 4 pads today. Bright red bleeding when going for BMS. Pt reports taking warfarin for afib and switched to eliquis a few months ago, then pt noticed more frequent bowel movements. Pt reports taking fiber supplements, but increased BMs started before then.  Hx hemm stroke, agib, hemmorhoids  150/80 70s hr 98% ra

## 2020-02-18 NOTE — Progress Notes (Signed)
PROGRESS NOTE    LATERICA MATARAZZO  PPJ:[id number] DOB: 1933-01-31 DOA: 02/18/2020 PCP: Abner Greenspan, MD    Brief Narrative:  86 y.o. female with medical history significant for A. fib on Eliquis status post AV nodal ablation and pacemaker placement May 2021, history of GI bleed, with multiple polyps on colonoscopy 5809, diastolic heart failure and hypothyroidism as well as history of TIA, who presents to the emergency room with a 2-day history of episodes of bright red blood per rectum, initially small amounts mixed in with stool, but has become heavier and more frequent. She denies dizziness or lightheadedness, palpitations or shortness of breath and denies chest pain or abdominal pain. Pt was admitted for further workup  Assessment & Plan:   Principal Problem:   Hematochezia Active Problems:   Hypothyroidism   Atrial fibrillation, chronic (HCC)   COLONIC POLYPS, ADENOMATOUS, HX OF   (HFpEF) heart failure with preserved ejection fraction ([place])   Long term (current) use of anticoagulants   S/P AV nodal ablation 07/28/19   S/P placement of cardiac pacemaker MDT 07/28/19   History of TIA (transient ischemic attack)    Hematochezia:   Colonic polyps, adenomatous, history of, colonoscopy 2019   Long term (current) use of anticoagulants -Patient on systemic anticoagulation with Eliquis with history of GI bleeding: Polyps presenting with 2-day history of several episodes of hematochezia, hemodynamically stable with hemoglobin 13 at time of presentation -GI consulted, appreciate recs -GI plans for continuing to monitor h/h and transfuse for hgb<7. Potential for colonoscopy Tues pending clinical course. Also recommendation for cathartics given large stool burden on CT abd    Hypothyroidism -Hold home meds for now    Atrial fibrillation, chronic, on Eliquis   S/P AV nodal ablation 07/28/19 -Hold Eliquis due to GI bleed -Currently rate controlled    S/P placement of cardiac pacemaker MDT  07/28/19 -seems to be stable at this time    History of TIA (transient ischemic attack) -Hold antiplatelet. Have resumed home zetia   (HFpEF) heart failure with preserved ejection fraction (HCC) -Currently euvolemic at this time  DVT prophylaxis: SCD's Code Status: Full Family Communication: Pt in room, family not at bedside  Status is: Inpatient  Remains inpatient appropriate because:Ongoing diagnostic testing needed not appropriate for outpatient work up and Inpatient level of care appropriate due to severity of illness   Dispo: The patient is from: Home              Anticipated d/c is to: Home              Anticipated d/c date is: 2 days              Patient currently is not medically stable to d/c.       Consultants:   GI  Procedures:     Antimicrobials: Anti-infectives (From admission, onward)   None       Subjective: Still having blood per rectum this AM. Denies abd pain  Objective: Vitals:   02/18/20 1200 02/18/20 1230 02/18/20 1330 02/18/20 1400  BP: (!) 147/75 (!) 151/75 (!) 145/71 (!) 137/57  Pulse: 72 71 69 72  Resp: 11 15 13 14   Temp:      TempSrc:      SpO2: 97% 98% 94% 97%  Weight:      Height:       No intake or output data in the 24 hours ending 02/18/20 1458 Filed Weights   02/18/20 0159  Weight: 75.8 kg  Examination: General exam: Appears calm and comfortable  Respiratory system: Clear to auscultation. Respiratory effort normal. Cardiovascular system: S1 & S2 heard, Regular Gastrointestinal system: Abdomen is nondistended, soft and nontender. No organomegaly or masses felt. Normal bowel sounds heard. Central nervous system: Alert and oriented. No focal neurological deficits. Extremities: Symmetric 5 x 5 power. Skin: No rashes, lesions Psychiatry: Judgement and insight appear normal. Mood & affect appropriate.   Data Reviewed: I have personally reviewed following labs and imaging studies  CBC: Recent Labs  Lab  02/18/20 0210 02/18/20 0810 02/18/20 1400  WBC 6.3 4.4 4.2  NEUTROABS 3.8  --   --   HGB 13.2 11.7* 12.1  HCT 41.3 36.7 38.5  MCV 87.5 88.0 87.7  PLT 160 136* 592*   Basic Metabolic Panel: Recent Labs  Lab 02/18/20 0210  NA 143  K 3.9  CL 106  CO2 26  GLUCOSE 110*  BUN 36*  CREATININE 0.80  CALCIUM 9.3   GFR: Estimated Creatinine Clearance: 52.4 mL/min (by C-G formula based on SCr of 0.8 mg/dL). Liver Function Tests: Recent Labs  Lab 02/18/20 0210  AST 35  ALT 24  ALKPHOS 43  BILITOT 0.7  PROT 7.5  ALBUMIN 4.0   No results for input(s): LIPASE, AMYLASE in the last 168 hours. No results for input(s): AMMONIA in the last 168 hours. Coagulation Profile: Recent Labs  Lab 02/18/20 0210  INR 1.5*   Cardiac Enzymes: No results for input(s): CKTOTAL, CKMB, CKMBINDEX, TROPONINI in the last 168 hours. BNP (last 3 results) No results for input(s): PROBNP in the last 8760 hours. HbA1C: No results for input(s): HGBA1C in the last 72 hours. CBG: No results for input(s): GLUCAP in the last 168 hours. Lipid Profile: No results for input(s): CHOL, HDL, LDLCALC, TRIG, CHOLHDL, LDLDIRECT in the last 72 hours. Thyroid Function Tests: No results for input(s): TSH, T4TOTAL, FREET4, T3FREE, THYROIDAB in the last 72 hours. Anemia Panel: No results for input(s): VITAMINB12, FOLATE, FERRITIN, TIBC, IRON, RETICCTPCT in the last 72 hours. Sepsis Labs: Recent Labs  Lab 02/18/20 0210  LATICACIDVEN 1.1    Recent Results (from the past 240 hour(s))  Resp Panel by RT-PCR (Flu A&B, Covid) Nasopharyngeal Swab     Status: None   Collection Time: 02/18/20  1:58 AM   Specimen: Nasopharyngeal Swab; Nasopharyngeal(NP) swabs in vial transport medium  Result Value Ref Range Status   SARS Coronavirus 2 by RT PCR NEGATIVE NEGATIVE Final    Comment: (NOTE) SARS-CoV-2 target nucleic acids are NOT DETECTED.  The SARS-CoV-2 RNA is generally detectable in upper respiratory specimens during  the acute phase of infection. The lowest concentration of SARS-CoV-2 viral copies this assay can detect is 138 copies/mL. A negative result does not preclude SARS-Cov-2 infection and should not be used as the sole basis for treatment or other patient management decisions. A negative result may occur with  improper specimen collection/handling, submission of specimen other than nasopharyngeal swab, presence of viral mutation(s) within the areas targeted by this assay, and inadequate number of viral copies(<138 copies/mL). A negative result must be combined with clinical observations, patient history, and epidemiological information. The expected result is Negative.  Fact Sheet for Patients:  EntrepreneurPulse.com.au  Fact Sheet for Healthcare Providers:  IncredibleEmployment.be  This test is no t yet approved or cleared by the [place] FDA and  has been authorized for detection and/or diagnosis of SARS-CoV-2 by FDA under an Emergency Use Authorization (EUA). This EUA will remain  in effect (meaning this  test can be used) for the duration of the COVID-19 declaration under Section 564(b)(1) of the Act, 21 U.S.C.section 360bbb-3(b)(1), unless the authorization is terminated  or revoked sooner.       Influenza A by PCR NEGATIVE NEGATIVE Final   Influenza B by PCR NEGATIVE NEGATIVE Final    Comment: (NOTE) The Xpert Xpress SARS-CoV-2/FLU/RSV plus assay is intended as an aid in the diagnosis of influenza from Nasopharyngeal swab specimens and should not be used as a sole basis for treatment. Nasal washings and aspirates are unacceptable for Xpert Xpress SARS-CoV-2/FLU/RSV testing.  Fact Sheet for Patients: EntrepreneurPulse.com.au  Fact Sheet for Healthcare Providers: IncredibleEmployment.be  This test is not yet approved or cleared by the [place] FDA and has been authorized for detection and/or  diagnosis of SARS-CoV-2 by FDA under an Emergency Use Authorization (EUA). This EUA will remain in effect (meaning this test can be used) for the duration of the COVID-19 declaration under Section 564(b)(1) of the Act, 21 U.S.C. section 360bbb-3(b)(1), unless the authorization is terminated or revoked.  Performed at [facility], [address], [place], [place] [address]      Radiology Studies: CT Angio Abd/Pel W and/or Wo Contrast  Result Date: 02/18/2020 CLINICAL DATA:  GI bleed.  Rectal bleeding. EXAM: CTA ABDOMEN AND PELVIS WITHOUT AND WITH CONTRAST TECHNIQUE: Multidetector CT imaging of the abdomen and pelvis was performed using the standard protocol during bolus administration of intravenous contrast. Multiplanar reconstructed images and MIPs were obtained and reviewed to evaluate the vascular anatomy. CONTRAST:  142mL OMNIPAQUE IOHEXOL 350 MG/ML SOLN COMPARISON:  CT dated February 28, 2013 FINDINGS: VASCULAR Aorta: There are atherosclerotic changes of the abdominal aorta without evidence for an aneurysm Celiac: Patent without evidence of aneurysm, dissection, vasculitis or significant stenosis. SMA: Patent without evidence of aneurysm, dissection, vasculitis or significant stenosis. There is a replaced right hepatic artery, a normal variant. Renals: Both renal arteries are patent without evidence of aneurysm, dissection, vasculitis, fibromuscular dysplasia or significant stenosis. IMA: Patent without evidence of aneurysm, dissection, vasculitis or significant stenosis. Inflow: Patent without evidence of aneurysm, dissection, vasculitis or significant stenosis. Proximal Outflow: Bilateral common femoral and visualized portions of the superficial and profunda femoral arteries are patent without evidence of aneurysm, dissection, vasculitis or significant stenosis. Veins: No obvious venous abnormality within the limitations of this arterial phase study. Review of the MIP images confirms  the above findings. NON-VASCULAR Lower chest: The lung bases are clear. The heart size is normal. Hepatobiliary: The liver is normal. Normal gallbladder.There is no biliary ductal dilation. Pancreas: Small calcifications are noted at the pancreatic head. There is no definite underlying pancreatic mass. Spleen: Unremarkable. Adrenals/Urinary Tract: --Adrenal glands: Unremarkable. --Right kidney/ureter: No hydronephrosis or radiopaque kidney stones. --Left kidney/ureter: No hydronephrosis or radiopaque kidney stones. --Urinary bladder: Unremarkable. Stomach/Bowel: --Stomach/Duodenum: There is a small hiatal hernia. --Small bowel: Unremarkable. --Colon: There is scattered colonic diverticula without CT evidence for diverticulitis. There is a large amount of stool in the colon. --Appendix: Not visualized. No right lower quadrant inflammation or free fluid. Lymphatic: --No retroperitoneal lymphadenopathy. --No mesenteric lymphadenopathy. --No pelvic or inguinal lymphadenopathy. Reproductive: Unremarkable Other: No ascites or free air. The abdominal wall is normal. Musculoskeletal. No acute displaced fractures. IMPRESSION: 1. No evidence for active GI bleeding. 2. Colonic diverticulosis without CT evidence for diverticulitis. 3. Small hiatal hernia. 4. Small calcifications at the pancreatic head may be secondary to chronic pancreatitis. 5. Large amount of stool in the colon. Aortic Atherosclerosis (ICD10-I70.0). Electronically Signed  By: Constance Holster M.D.   On: 02/18/2020 03:57    Scheduled Meds: Continuous Infusions: . sodium chloride    . sodium chloride 100 mL/hr at 02/18/20 0813     LOS: 0 days   Marylu Lund, MD [facility] Pager On Amion  If 7PM-7AM, please contact night-coverage 02/18/2020, 2:58 PM

## 2020-02-18 NOTE — ED Notes (Signed)
Pt requesting coffee- will confirm with MD that pt can have clear liquids

## 2020-02-18 NOTE — H&P (Signed)
History and Physical    [name]:[id number] DOB: [birth year] DOA: 02/18/2020  PCP: Abner Greenspan, MD   Patient coming from: Home  I have personally briefly reviewed patient's old medical records in [place]  Chief Complaint: Rectal bleeding  HPI: [name] is a 86 y.o. female with medical history significant for A. fib on Eliquis status post AV nodal ablation and pacemaker placement May 2021, history of GI bleed, with multiple polyps on colonoscopy 8119, diastolic heart failure and hypothyroidism as well as history of TIA, who presents to the emergency room with a 2-day history of episodes of bright red blood per rectum, initially small amounts mixed in with stool, but has become heavier and more frequent. She denies dizziness or lightheadedness, palpitations or shortness of breath and denies chest pain or abdominal pain. ED Course: On arrival she was hemodynamically stable with BP 149/76 and pulse 71. Other vitals stable. CBC within normal limits with hemoglobin 13.2, platelets 160 K. INR 1.5. CMP WNL, lactic acid 1.1. Rectal exam in the ER with red blood, no stool. EKG as reviewed by me : A. fib, rate of 70, ventricular paced rhythm CT angiogram abdomen and pelvis done in the ER with preliminary read by ER provider showing no active bleeding, official report pending. Hospitalist consulted for admission.  Review of Systems: As per HPI otherwise all other systems on review of systems negative.    Past Medical History:  Diagnosis Date  . Allergic rhinitis   . 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 Arthritis   . Atrial fibrillation -persistent cardiologist-  dr klein/  primary EP -- [facility] Tawanna Sat ([facility])   a. s/p PVI [name] 2010;  b. on tikosyn/coumadin;  c. 05/2009 Echo: EF 60-65%, Gr 2 DD. (first dx 09/ 2007)  . Bilateral lower extremity edema   . Bleeding hemorrhoid   . Carotid stenosis    mild (hosp 3/11)- consult by vasc/ Dr Donnetta Hutching  . Complication of anesthesia    hard  to wake  . Diverticulosis of colon   . Dyspnea    on exertion-climbing stairs  . Fatty liver   . H/O cardiac radiofrequency ablation    01/ 2008 at [facility] /  03/ 2010  at [facility]  . Heart failure with preserved ejection fraction ([place])   . History of adenomatous polyp of colon    tubular adenoma's  . History of cardiomyopathy    secondary tachycardia-induced cardiomyopathy -- resolved 2014  . History of squamous cell carcinoma in situ (SCCIS) of skin    05/ 2017  nasal bridge and right medial knee  . History of transient ischemic attack (TIA)    01-24-2005 and 06-12-2009  . Hyperlipidemia   . Hypothyroidism   . Mild intermittent asthma    reacts to cats  . Mixed stress and urge urinary incontinence   . Pulmonary nodule   . S/P AV nodal ablation 07/28/19 07/29/2019  . S/P mitral valve repair 10-23-1998  dr Boyce Medici at [facility]   for MVP and regurg. (annuloplasty ring procedure)  . S/P placement of cardiac pacemaker MDT 07/28/19 07/29/2019    Past Surgical History:  Procedure Laterality Date  . APPENDECTOMY  1978  . AV NODE ABLATION N/A 07/28/2019   Procedure: AV NODE ABLATION;  Surgeon: Deboraha Sprang, MD;  Location: [place] [facility];  Service: Cardiovascular;  Laterality: N/A;  . BUBBLE STUDY  06/19/2019   Procedure: BUBBLE STUDY;  Surgeon: Lyman Bishop  C, MD;  Location: [place];  Service: Cardiovascular;;  . CARDIAC ELECTROPHYSIOLOGY [place]  01/ 2008    at [place]   right-sided ablation atrial flutter  . CARDIAC ELECTROPHYSIOLOGY STUDY AND ABLATION  03/ 2010   dr Jaymes Graff at [facility]   AV node ablation and pulmonary vein isolation for atrial fib  . CARDIOVERSION  06-18-2006;  07-13-2006;  10-19-2010;  10-27-2010  . COLONOSCOPY    . COLONOSCOPY WITH PROPOFOL N/A 10/13/2017   Procedure: COLONOSCOPY WITH PROPOFOL;  Surgeon: [name], MD;  Location: [facility] ENDOSCOPY;  Service: Gastroenterology;  Laterality: N/A;  . CYSTO/  TRANSURETHRAL COLLAGEN INJECTION THERAPY  07-26-2007   dr Matilde Sprang  . DILATION AND CURETTAGE OF UTERUS    . ESOPHAGOGASTRODUODENOSCOPY (EGD) WITH PROPOFOL N/A 10/13/2017   Procedure: ESOPHAGOGASTRODUODENOSCOPY (EGD) WITH PROPOFOL;  Surgeon: [name], MD;  Location: [facility] ENDOSCOPY;  Service: Gastroenterology;  Laterality: N/A;  . EXCISIONAL HEMORRHOIDECTOMY  1980s  . GIVENS CAPSULE STUDY N/A 12/08/2017   Procedure: GIVENS CAPSULE STUDY;  Surgeon: [name], MD;  Location: [facility] ENDOSCOPY;  Service: Gastroenterology;  Laterality: N/A;  . HEMORRHOID SURGERY N/A 10/29/2016   Procedure: HEMORRHOIDECTOMY;  Surgeon: Leighton Ruff, MD;  Location: [facility];  Service: General;  Laterality: N/A;  . MITRAL VALVE ANNULOPLASTY  10/23/1998   "Model [id number]; Campbell Lerner [id number]"; size 93mm; [facility]; Dr. Boyce Medici  . PACEMAKER IMPLANT N/A 07/28/2019   Procedure: PACEMAKER IMPLANT;  Surgeon: Deboraha Sprang, MD;  Location: [place] [facility];  Service: Cardiovascular;  Laterality: N/A;  . [place]  . TEE WITH CARDIOVERSION  05-06-2006 at [facility];  01-02-2013 at [facility]  . TEE WITHOUT CARDIOVERSION N/A 06/19/2019   Procedure: TRANSESOPHAGEAL ECHOCARDIOGRAM (TEE);  Surgeon: Pixie Casino, MD;  Location: [facility] ENDOSCOPY;  Service: Cardiovascular;  Laterality: N/A;  . TOTAL HIP ARTHROPLASTY Left 05/04/2017   Procedure: LEFT TOTAL HIP ARTHROPLASTY ANTERIOR APPROACH;  Surgeon: Mcarthur Rossetti, MD;  Location: [place];  Service: Orthopedics;  Laterality: Left;  . TRANSTHORACIC ECHOCARDIOGRAM  05-01-2015   dr Caryl Comes   ef 50-55%/  mild AV sclerosis without stenosis/  post MV repair with mild central MR (valve area by pressure half-time 2cm^2,  valve area by continutity equation 0.91cm^2, peak grandiant 86mmHg)/  severe LAE/ mild TR/ mild RAE   . TUBAL LIGATION Bilateral 1978     reports that she has never smoked. She has never used smokeless tobacco. She reports current alcohol use.  She reports that she does not use drugs.  Allergies  Allergen Reactions  . Amiodarone Hcl Swelling    SWELLING REACTION UNSPECIFIED   . Penicillins Rash    Has patient had a PCN reaction causing immediate rash, facial/tongue/throat swelling, SOB or lightheadedness with hypotension: No Has patient had a PCN reaction causing severe rash involving mucus membranes or skin necrosis: No Has patient had a PCN reaction that required hospitalization:Patient was inpatient when reaction occurred Has patient had a PCN reaction occurring within the last 10 years: No If all of the above answers are "NO", then may proceed with Cephalosporin use.   . Statins Rash    Family History  Problem Relation Age of Onset  . Lung cancer Father        smoker, died at 106  . Alcohol abuse Father   . Cancer Father        bladder and lung CA smoker  . Breast cancer Neg Hx   . Stroke Neg Hx  Prior to Admission medications   Medication Sig Start Date End Date Taking? Authorizing Provider  acetaminophen (TYLENOL) 325 MG tablet Take 325 mg by mouth every 6 (six) hours as needed for moderate pain or headache.   Yes [provider]  albuterol (PROVENTIL HFA;VENTOLIN HFA) 108 (90 Base) MCG/ACT inhaler Inhale 2 puffs into the lungs every 4 (four) hours as needed for wheezing or shortness of breath. 06/23/16  Yes Tower, Wynelle Fanny, MD  diclofenac Sodium (VOLTAREN) 1 % GEL Apply 1 application topically 4 (four) times daily as needed (pain).   Yes [provider]  diphenhydrAMINE (BENADRYL) 25 mg capsule Take 50 mg by mouth every 6 (six) hours as needed for itching.   Yes [provider]  diphenhydrAMINE-zinc acetate (BENADRYL) cream Apply 1 application topically 3 (three) times daily as needed for itching.   Yes [provider]  ELIQUIS 5 MG TABS tablet TAKE 1 TABLET TWICE DAILY 12/13/19  Yes Deboraha Sprang, MD  ezetimibe (ZETIA) 10 MG tablet Take 1 tablet (10 mg total) by mouth daily.  09/13/19  Yes Deboraha Sprang, MD  ferrous sulfate 325 (65 FE) MG EC tablet TAKE 1 TABLET EVERY DAY WITH BREAKFAST 11/28/19  Yes Tower, Wynelle Fanny, MD  fluticasone (FLONASE) 50 MCG/ACT nasal spray Place 1 spray into both nostrils daily as needed for allergies.   Yes [provider]  furosemide (LASIX) 40 MG tablet Take 40 mg by mouth daily as needed for edema.   Yes [provider]  levothyroxine (SYNTHROID) 25 MCG tablet TAKE 1 TABLET EVERY DAY BEFORE BREAKFAST 11/28/19  Yes Tower, Wynelle Fanny, MD  loratadine (CLARITIN) 10 MG tablet Take 10 mg by mouth daily as needed for allergies.    Yes [provider]  Polyethyl Glycol-Propyl Glycol (LUBRICANT EYE DROPS) 0.4-0.3 % SOLN Place 1-2 drops into both eyes 3 (three) times daily as needed (for dry eyes.).   Yes [provider]    Physical Exam: Vitals:   02/18/20 0159 02/18/20 0201 02/18/20 0230 02/18/20 0330  BP:  (!) 149/76 (!) 143/63 (!) 151/73  Pulse:  71 70 69  Resp:  16 15 12   Temp:  98 F (36.7 C)    TempSrc:  Oral    SpO2:  100% 94% 98%  Weight: 75.8 kg     Height: 5\' 5"  (1.651 m)        Vitals:   02/18/20 0159 02/18/20 0201 02/18/20 0230 02/18/20 0330  BP:  (!) 149/76 (!) 143/63 (!) 151/73  Pulse:  71 70 69  Resp:  16 15 12   Temp:  98 F (36.7 C)    TempSrc:  Oral    SpO2:  100% 94% 98%  Weight: 75.8 kg     Height: 5\' 5"  (1.651 m)         Constitutional: Alert and oriented x 3 . Not in any apparent distress HEENT:      Head: Normocephalic and atraumatic.         Eyes: PERLA, EOMI, Conjunctivae are normal. Sclera is non-icteric.       Mouth/Throat: Mucous membranes are moist.       Neck: Supple with no signs of meningismus. Cardiovascular: Regular rate and rhythm. No murmurs, gallops, or rubs. 2+ symmetrical distal pulses are present . No JVD. No LE edema Respiratory: Respiratory effort normal .Lungs sounds clear bilaterally. No wheezes, crackles, or rhonchi.  Gastrointestinal: Soft, non  tender, and non distended with positive bowel sounds. No rebound or  guarding. Genitourinary: No CVA tenderness. Musculoskeletal: Nontender with normal range of motion in all extremities. No cyanosis, or erythema of extremities. Neurologic:  Face is symmetric. Moving all extremities. No gross focal neurologic deficits . Skin: Skin is warm, dry.  No rash or ulcers Psychiatric: Mood and affect are normal    Labs on Admission: I have personally reviewed following labs and imaging studies  CBC: Recent Labs  Lab 02/18/20 0210  WBC 6.3  NEUTROABS 3.8  HGB 13.2  HCT 41.3  MCV 87.5  PLT 315   Basic Metabolic Panel: Recent Labs  Lab 02/18/20 0210  NA 143  K 3.9  CL 106  CO2 26  GLUCOSE 110*  BUN 36*  CREATININE 0.80  CALCIUM 9.3   GFR: Estimated Creatinine Clearance: 52.4 mL/min (by C-G formula based on SCr of 0.8 mg/dL). Liver Function Tests: Recent Labs  Lab 02/18/20 0210  AST 35  ALT 24  ALKPHOS 43  BILITOT 0.7  PROT 7.5  ALBUMIN 4.0   No results for input(s): LIPASE, AMYLASE in the last 168 hours. No results for input(s): AMMONIA in the last 168 hours. Coagulation Profile: Recent Labs  Lab 02/18/20 0210  INR 1.5*   Cardiac Enzymes: No results for input(s): CKTOTAL, CKMB, CKMBINDEX, TROPONINI in the last 168 hours. BNP (last 3 results) No results for input(s): PROBNP in the last 8760 hours. HbA1C: No results for input(s): HGBA1C in the last 72 hours. CBG: No results for input(s): GLUCAP in the last 168 hours. Lipid Profile: No results for input(s): CHOL, HDL, LDLCALC, TRIG, CHOLHDL, LDLDIRECT in the last 72 hours. Thyroid Function Tests: No results for input(s): TSH, T4TOTAL, FREET4, T3FREE, THYROIDAB in the last 72 hours. Anemia Panel: No results for input(s): VITAMINB12, FOLATE, FERRITIN, TIBC, IRON, RETICCTPCT in the last 72 hours. Urine analysis:    Component Value Date/Time   COLORURINE STRAW (A) 06/16/2019 1245   APPEARANCEUR CLEAR 06/16/2019  1245   APPEARANCEUR Clear 02/28/2013 1204   LABSPEC 1.039 (H) 06/16/2019 1245   LABSPEC 1.006 02/28/2013 1204   PHURINE 5.0 06/16/2019 1245   GLUCOSEU NEGATIVE 06/16/2019 1245   GLUCOSEU Negative 02/28/2013 1204   HGBUR MODERATE (A) 06/16/2019 1245   BILIRUBINUR NEGATIVE 06/16/2019 1245   BILIRUBINUR Negative 02/04/2018 1703   BILIRUBINUR Negative 02/28/2013 1204   KETONESUR NEGATIVE 06/16/2019 1245   PROTEINUR NEGATIVE 06/16/2019 1245   UROBILINOGEN 0.2 02/04/2018 1703   UROBILINOGEN 0.2 02/08/2012 0324   NITRITE NEGATIVE 06/16/2019 1245   LEUKOCYTESUR NEGATIVE 06/16/2019 1245   LEUKOCYTESUR 3+ 02/28/2013 1204    Radiological Exams on Admission: No results found.   Assessment/Plan 86 year old female with history of A. fib on Eliquis with AV nodal ablation and pacemaker placement May 2021, history of GI bleed, with multiple polyps on colonoscopy 4008, diastolic heart failure and hypothyroidism as well as history of TIA, presenting with a 2-day history of several episodes of bright red blood per rectum,     Hematochezia:   Colonic polyps, adenomatous, history of, colonoscopy 2019   Long term (current) use of anticoagulants -Patient on systemic anticoagulation with Eliquis with history of GI bleeding: Polyps presenting with 2-day history of several episodes of hematochezia, hemodynamically stable with hemoglobin 13 -No active bleeding on CT abdomen and pelvis -Serial H&H -Type and screen and transfuse if two-point drop hemoglobin in 6 h -GI consult -Keep n.p.o., IV hydration    Hypothyroidism -Hold home meds for now    Atrial fibrillation, chronic, on Eliquis   S/P AV nodal ablation  07/28/19 -Hold Eliquis due to GI bleed -Currently rate controlled    S/P placement of cardiac pacemaker MDT 07/28/19 -No acute concerns    History of TIA (transient ischemic attack) -Hold antiplatelet. Hold statin while n.p.o.   (HFpEF) heart failure with preserved ejection fraction  (HCC) -Currently euvolemic   DVT prophylaxis: SCDs Code Status: full code  Family Communication:  none  Disposition Plan: Back to previous home environment Consults called: GI Status:At the time of admission, it appears that the appropriate admission status for this patient is INPATIENT. This is judged to be reasonable and necessary in order to provide the required intensity of service to ensure the patient's safety given the presenting symptoms, physical exam findings, and initial radiographic and laboratory data in the context of their  Comorbid conditions.   Patient requires inpatient status due to high intensity of service, high risk for further deterioration and high frequency of surveillance required.   I certify that at the point of admission it is my clinical judgment that the patient will require inpatient hospital care spanning beyond [place] MD [facility]     02/18/2020, 3:39 AM

## 2020-02-18 NOTE — ED Notes (Signed)
Confirmed with MD- pt given black coffee

## 2020-02-18 NOTE — Progress Notes (Signed)
Witnessed patient passing free blood from rectum without passing any stool. Urine appears clear to yellow, with no signs of blood. Patient has not had a bowel movement since arrival to unit.

## 2020-02-18 NOTE — ED Provider Notes (Signed)
[name] Provider Note  ____________________________________________  Time seen: Approximately 2:33 AM  I have reviewed the triage vital signs and the nursing notes.   HISTORY  Chief Complaint Rectal Bleeding   HPI [name] is a 86 y.o. female with a history of A. fib on Eliquis, diverticular disease,  hyperlipidemia, hypothyroidism, asthma, pacemaker, mitral valve regurgitation who presents for evaluation of bloody stools.  Patient reports 48 hours of several daily episodes of bloody stools.  Patient reports large amount of blood every time in the toilet.  Denies any dizziness, chest pain, abdominal pain.  Patient reports having intermittent blood when she wipes in the past but never large amount of bleeding like this.  She denies hematemesis, nausea, vomiting.  Last colonoscopy was in 2019 showing some polyps but no other abnormalities.  Past Medical History:  Diagnosis Date  . Allergic rhinitis   . 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 Arthritis   . Atrial fibrillation -persistent cardiologist-  dr klein/  primary EP -- dr Tawanna Sat ([facility])   a. s/p PVI [name] 2010;  b. on tikosyn/coumadin;  c. 05/2009 Echo: EF 60-65%, Gr 2 DD. (first dx 09/ 2007)  . Bilateral lower extremity edema   . Bleeding hemorrhoid   . Carotid stenosis    mild (hosp 3/11)- consult by vasc/ Dr Donnetta Hutching  . Complication of anesthesia    hard to wake  . Diverticulosis of colon   . Dyspnea    on exertion-climbing stairs  . Fatty liver   . H/O cardiac radiofrequency ablation    01/ 2008 at [facility] /  03/ 2010  at [facility]  . Heart failure with preserved ejection fraction ([place])   . History of adenomatous polyp of colon    tubular adenoma's  . History of cardiomyopathy    secondary tachycardia-induced cardiomyopathy -- resolved 2014  . History of squamous cell carcinoma in situ (SCCIS) of skin    05/ 2017  nasal bridge and right medial knee  .  History of transient ischemic attack (TIA)    01-24-2005 and 06-12-2009  . Hyperlipidemia   . Hypothyroidism   . Mild intermittent asthma    reacts to cats  . Mixed stress and urge urinary incontinence   . Pulmonary nodule   . S/P AV nodal ablation 07/28/19 07/29/2019  . S/P mitral valve repair 10-23-1998  dr Boyce Medici at [facility]   for MVP and regurg. (annuloplasty ring procedure)  . S/P placement of cardiac pacemaker MDT 07/28/19 07/29/2019    Patient Active Problem List   Diagnosis Date Noted  . Rectal bleeding 02/18/2020  . History of TIA (transient ischemic attack) 02/18/2020  . S/P AV nodal ablation 07/28/19 07/29/2019  . S/P placement of cardiac pacemaker MDT 07/28/19 07/29/2019  . AV block 07/28/2019  . CVA (cerebral vascular accident) ([place]) 06/16/2019  . Facial tingling 11/29/2018  . Tremor of left hand 11/29/2018  . Medicare annual wellness visit, subsequent 11/24/2018  . Dysuria 02/06/2018  . Iron deficiency anemia 10/21/2017  . Rapid atrial fibrillation ([place]) 08/19/2017  . Constipation 08/02/2017  . Numbness and tingling 07/14/2017  . Paresthesia 07/14/2017  . Unilateral primary osteoarthritis, left hip 05/04/2017  . Status post total replacement of left hip 05/04/2017  . Hip osteoarthritis 04/27/2017  . Long term (current) use of anticoagulants 03/04/2017  . Venous stasis dermatitis of both lower extremities 01/08/2017  . Impacted cerumen of right ear 11/20/2016  . Osteopenia 10/25/2016  .  Pedal edema 08/26/2016  . Varicose veins of both lower extremities 08/26/2016  . Estrogen deficiency 08/26/2016  . Screening mammogram, encounter for 08/26/2016  . Hemorrhoids 08/26/2016  . History of nonmelanoma skin cancer 01/01/2016  . Hip pain 08/02/2014  . Left knee pain 08/02/2014  . Chronic cough 05/08/2014  . Hematochezia 04/09/2014  . Caregiver stress 08/16/2013  . Colon cancer screening 08/16/2013  . Encounter for therapeutic drug monitoring 04/20/2013  . Left  ovarian cyst 03/14/2013  . (HFpEF) heart failure with preserved ejection fraction ([place]) 12/27/2012  . Palpitations 04/15/2012  . Cardiomyopathy, secondary --Resolved again 10/14 10/13/2010  . COLONIC POLYPS, ADENOMATOUS, HX OF 09/18/2009  . PULMONARY NODULE 12/20/2008  . GANGLION CYST 10/04/2007  . Hyperlipidemia 04/27/2007  . Depression with anxiety 04/27/2007  . Asthma, mild intermittent 04/27/2007  . INSOMNIA 04/27/2007  . ADENOMATOUS COLONIC POLYP 11/04/2006  . Hypothyroidism 09/02/2006  . Atrial fibrillation, chronic ([place]) 08/05/2006    Past Surgical History:  Procedure Laterality Date  . APPENDECTOMY  1978  . AV NODE ABLATION N/A 07/28/2019   Procedure: AV NODE ABLATION;  Surgeon: Deboraha Sprang, MD;  Location: [place] [facility] LAB;  Service: Cardiovascular;  Laterality: N/A;  . BUBBLE STUDY  06/19/2019   Procedure: BUBBLE STUDY;  Surgeon: Pixie Casino, MD;  Location: [facility] ENDOSCOPY;  Service: Cardiovascular;;  . CARDIAC ELECTROPHYSIOLOGY [place]  01/ 2008    at [place]   right-sided ablation atrial flutter  . CARDIAC ELECTROPHYSIOLOGY STUDY AND ABLATION  03/ 2010   dr Jaymes Graff at [facility]   AV node ablation and pulmonary vein isolation for atrial fib  . CARDIOVERSION  06-18-2006;  07-13-2006;  10-19-2010;  10-27-2010  . COLONOSCOPY    . COLONOSCOPY WITH PROPOFOL N/A 10/13/2017   Procedure: COLONOSCOPY WITH PROPOFOL;  Surgeon: [name], MD;  Location: [facility]COPY;  Service: Gastroenterology;  Laterality: N/A;  . CYSTO/ TRANSURETHRAL COLLAGEN INJECTION THERAPY  07-26-2007   dr Matilde Sprang  . DILATION AND CURETTAGE OF UTERUS    . ESOPHAGOGASTRODUODENOSCOPY (EGD) WITH PROPOFOL N/A 10/13/2017   Procedure: ESOPHAGOGASTRODUODENOSCOPY (EGD) WITH PROPOFOL;  Surgeon: [name], MD;  Location: [facility] END[facility];  Service: Gastroenterology;  Laterality: N/A;  . EXCISIONAL HEMORRHOIDECTOMY  1980s  . GIVENS CAPSULE STUDY N/A 12/08/2017   Procedure: GIVENS CAPSULE  STUDY;  Surgeon: [name], MD;  Location: [facility] ENDOSCOPY;  Service: Gastroenterology;  Laterality: N/A;  . HEMORRHOID SURGERY N/A 10/29/2016   Procedure: HEMORRHOIDECTOMY;  Surgeon: Leighton Ruff, MD;  Location: [facility];  Service: General;  Laterality: N/A;  . MITRAL VALVE ANNULOPLASTY  10/23/1998   "Model [id number]; [employer] [id number]"; size 20mm; [facility]; Dr. Boyce Medici  . PACEMAKER IMPLANT N/A 07/28/2019   Procedure: PACEMAKER IMPLANT;  Surgeon: Deboraha Sprang, MD;  Location: [place] CV LAB;  Service: Cardiovascular;  Laterality: N/A;  . [place]  . TEE WITH CARDIOVERSION  05-06-2006 at [facility];  01-02-2013 at [facility]  . TEE WITHOUT CARDIOVERSION N/A 06/19/2019   Procedure: TRANSESOPHAGEAL ECHOCARDIOGRAM (TEE);  Surgeon: Pixie Casino, MD;  Location: [facility] ENDOSCOPY;  Service: Cardiovascular;  Laterality: N/A;  . TOTAL HIP ARTHROPLASTY Left 05/04/2017   Procedure: LEFT TOTAL HIP ARTHROPLASTY ANTERIOR APPROACH;  Surgeon: Mcarthur Rossetti, MD;  Location: [place];  Service: Orthopedics;  Laterality: Left;  . TRANSTHORACIC ECHOCARDIOGRAM  05-01-2015   dr Caryl Comes   ef 50-55%/  mild AV sclerosis without stenosis/  post MV repair with mild central MR (valve area by pressure  half-time 2cm^2,  valve area by continutity equation 0.91cm^2, peak grandiant 37mmHg)/  severe LAE/ mild TR/ mild RAE   . TUBAL LIGATION Bilateral 1978    Prior to Admission medications   Medication Sig Start Date End Date Taking? Authorizing Provider  acetaminophen (TYLENOL) 325 MG tablet Take 325 mg by mouth every 6 (six) hours as needed for moderate pain or headache.   Yes [provider]  albuterol (PROVENTIL HFA;VENTOLIN HFA) 108 (90 Base) MCG/ACT inhaler Inhale 2 puffs into the lungs every 4 (four) hours as needed for wheezing or shortness of breath. 06/23/16  Yes Tower, Wynelle Fanny, MD  diclofenac Sodium (VOLTAREN) 1 % GEL Apply 1 application topically 4 (four) times daily as needed  (pain).   Yes [provider]  diphenhydrAMINE (BENADRYL) 25 mg capsule Take 50 mg by mouth every 6 (six) hours as needed for itching.   Yes [provider]  diphenhydrAMINE-zinc acetate (BENADRYL) cream Apply 1 application topically 3 (three) times daily as needed for itching.   Yes [provider]  ELIQUIS 5 MG TABS tablet TAKE 1 TABLET TWICE DAILY 12/13/19  Yes Deboraha Sprang, MD  ezetimibe (ZETIA) 10 MG tablet Take 1 tablet (10 mg total) by mouth daily. 09/13/19  Yes Deboraha Sprang, MD  ferrous sulfate 325 (65 FE) MG EC tablet TAKE 1 TABLET EVERY DAY WITH BREAKFAST 11/28/19  Yes Tower, Wynelle Fanny, MD  fluticasone (FLONASE) 50 MCG/ACT nasal spray Place 1 spray into both nostrils daily as needed for allergies.   Yes [provider]  furosemide (LASIX) 40 MG tablet Take 40 mg by mouth daily as needed for edema.   Yes [provider]  levothyroxine (SYNTHROID) 25 MCG tablet TAKE 1 TABLET EVERY DAY BEFORE BREAKFAST 11/28/19  Yes Tower, Wynelle Fanny, MD  loratadine (CLARITIN) 10 MG tablet Take 10 mg by mouth daily as needed for allergies.    Yes [provider]  Polyethyl Glycol-Propyl Glycol (LUBRICANT EYE DROPS) 0.4-0.3 % SOLN Place 1-2 drops into both eyes 3 (three) times daily as needed (for dry eyes.).   Yes [provider]    Allergies Amiodarone hcl, Penicillins, and Statins  Family History  Problem Relation Age of Onset  . Lung cancer Father        smoker, died at 69  . Alcohol abuse Father   . Cancer Father        bladder and lung CA smoker  . Breast cancer Neg Hx   . Stroke Neg Hx     Social History Social History   Tobacco Use  . Smoking status: Never Smoker  . Smokeless tobacco: Never Used  Vaping Use  . Vaping Use: Never used  Substance Use Topics  . Alcohol use: Yes    Alcohol/week: 0.0 standard drinks    Comment: seldom  . Drug use: No    Review of Systems  Constitutional: Negative for fever. Eyes: Negative  for visual changes. ENT: Negative for sore throat. Neck: No neck pain  Cardiovascular: Negative for chest pain. Respiratory: Negative for shortness of breath. Gastrointestinal: Negative for abdominal pain, vomiting or diarrhea. Genitourinary: Negative for dysuria. + rectal bleeding Musculoskeletal: Negative for back pain. Skin: Negative for rash. Neurological: Negative for headaches, weakness or numbness. Psych: No SI or HI  ____________________________________________   PHYSICAL EXAM:  VITAL SIGNS: ED Triage Vitals  Enc Vitals Group     BP 02/18/20 0201 (!) 149/76     Pulse Rate 02/18/20 0201 71  Resp 02/18/20 0158 16     Temp 02/18/20 0201 98 F (36.7 C)     Temp Source 02/18/20 0201 Oral     SpO2 02/18/20 0201 100 %     Weight 02/18/20 0159 167 lb (75.8 kg)     Height 02/18/20 0159 5\' 5"  (1.651 m)     Head Circumference --      Peak Flow --      Pain Score 02/18/20 0159 0     Pain Loc --      Pain Edu? --      Excl. in [place]? --     Constitutional: Alert and oriented. Well appearing and in no apparent distress. HEENT:      Head: Normocephalic and atraumatic.         Eyes: Conjunctivae are normal. Sclera is non-icteric.       Mouth/Throat: Mucous membranes are moist.       Neck: Supple with no signs of meningismus. Cardiovascular: Regular rate and rhythm. No murmurs, gallops, or rubs.  Respiratory: Normal respiratory effort. Lungs are clear to auscultation bilaterally.  Gastrointestinal: Soft, non tender, and non distended. Genitourinary: Rectal exam with gross bright red blood, no hemorrhoids or fissures, no stool Musculoskeletal: No edema, cyanosis, or erythema of extremities. Neurologic: Normal speech and language. Face is symmetric. Moving all extremities. No gross focal neurologic deficits are appreciated. Skin: Skin is warm, dry and intact. No rash noted. Psychiatric: Mood and affect are normal. Speech and behavior are  normal.  ____________________________________________   LABS (all labs ordered are listed, but only abnormal results are displayed)  Labs Reviewed  COMPREHENSIVE METABOLIC PANEL - Abnormal; Notable for the following components:      Result Value   Glucose, Bld 110 (*)    BUN 36 (*)    All other components within normal limits  PROTIME-INR - Abnormal; Notable for the following components:   Prothrombin Time 17.1 (*)    INR 1.5 (*)    All other components within normal limits  APTT - Abnormal; Notable for the following components:   aPTT 37 (*)    All other components within normal limits  RESP PANEL BY RT-PCR (FLU A&B, COVID) ARPGX2  CBC WITH DIFFERENTIAL/PLATELET  LACTIC ACID, PLASMA  CBC  CBC  CBC  TYPE AND SCREEN  PREPARE RBC (CROSSMATCH)  ABO/RH   ____________________________________________  EKG  ED ECG REPORT I, Rudene Re, the attending physician, personally viewed and interpreted this ECG.  Atrial fibrillation, right bundle branch block, no concordant ST elevation.  Unchanged when compared to prior. ____________________________________________  RADIOLOGY  I have personally reviewed the images performed during this visit and I agree with the Radiologist's read.   Interpretation by Radiologist:  CT Angio Abd/Pel W and/or Wo Contrast  Result Date: 02/18/2020 CLINICAL DATA:  GI bleed.  Rectal bleeding. EXAM: CTA ABDOMEN AND PELVIS WITHOUT AND WITH CONTRAST TECHNIQUE: Multidetector CT imaging of the abdomen and pelvis was performed using the standard protocol during bolus administration of intravenous contrast. Multiplanar reconstructed images and MIPs were obtained and reviewed to evaluate the vascular anatomy. CONTRAST:  169mL OMNIPAQUE IOHEXOL 350 MG/ML SOLN COMPARISON:  CT dated February 28, 2013 FINDINGS: VASCULAR Aorta: There are atherosclerotic changes of the abdominal aorta without evidence for an aneurysm Celiac: Patent without evidence of aneurysm,  dissection, vasculitis or significant stenosis. SMA: Patent without evidence of aneurysm, dissection, vasculitis or significant stenosis. There is a replaced right hepatic artery, a normal variant. Renals: Both renal arteries are  patent without evidence of aneurysm, dissection, vasculitis, fibromuscular dysplasia or significant stenosis. IMA: Patent without evidence of aneurysm, dissection, vasculitis or significant stenosis. Inflow: Patent without evidence of aneurysm, dissection, vasculitis or significant stenosis. Proximal Outflow: Bilateral common femoral and visualized portions of the superficial and profunda femoral arteries are patent without evidence of aneurysm, dissection, vasculitis or significant stenosis. Veins: No obvious venous abnormality within the limitations of this arterial phase study. Review of the MIP images confirms the above findings. NON-VASCULAR Lower chest: The lung bases are clear. The heart size is normal. Hepatobiliary: The liver is normal. Normal gallbladder.There is no biliary ductal dilation. Pancreas: Small calcifications are noted at the pancreatic head. There is no definite underlying pancreatic mass. Spleen: Unremarkable. Adrenals/Urinary Tract: --Adrenal glands: Unremarkable. --Right kidney/ureter: No hydronephrosis or radiopaque kidney stones. --Left kidney/ureter: No hydronephrosis or radiopaque kidney stones. --Urinary bladder: Unremarkable. Stomach/Bowel: --Stomach/Duodenum: There is a small hiatal hernia. --Small bowel: Unremarkable. --Colon: There is scattered colonic diverticula without CT evidence for diverticulitis. There is a large amount of stool in the colon. --Appendix: Not visualized. No right lower quadrant inflammation or free fluid. Lymphatic: --No retroperitoneal lymphadenopathy. --No mesenteric lymphadenopathy. --No pelvic or inguinal lymphadenopathy. Reproductive: Unremarkable Other: No ascites or free air. The abdominal wall is normal. Musculoskeletal. No  acute displaced fractures. IMPRESSION: 1. No evidence for active GI bleeding. 2. Colonic diverticulosis without CT evidence for diverticulitis. 3. Small hiatal hernia. 4. Small calcifications at the pancreatic head may be secondary to chronic pancreatitis. 5. Large amount of stool in the colon. Aortic Atherosclerosis (ICD10-I70.0). Electronically Signed   By: Constance Holster M.D.   On: 02/18/2020 03:57      ____________________________________________   PROCEDURES  Procedure(s) performed:yes .1-3 Lead EKG Interpretation Performed by: Rudene Re, MD Authorized by: Rudene Re, MD     Interpretation: abnormal     ECG rate assessment: normal     Rhythm: atrial fibrillation     Ectopy: PVCs     Conduction: abnormal     Abnormal conduction comment:  RBBB   Critical Care performed:  None ____________________________________________   INITIAL IMPRESSION / ASSESSMENT AND PLAN / ED COURSE   86 y.o. female with a history of A. fib on Eliquis, diverticular disease,  hyperlipidemia, hypothyroidism, asthma, pacemaker, mitral valve regurgitation who presents for evaluation of bloody stools.  Patient reports several episodes over the last 48 hours.  She is hemodynamically stable with BP of 149/76, heart rate of 71.  Rectal exam showing gross blood with no stool, no evidence of hemorrhoids or fissure.  Old medical records reviewed including most recent colonoscopy and endoscopy done back in 2019 which showed only some polyps.  Patient does have CT image from 2013 showing diverticular disease.  No history of cirrhosis or varices.  Labs showing stable hemoglobin at 13.2, no significant electrolyte derangements, INR of 1.5.  Type and cross active.  We will get a CT angio to eval for any active bleeding.  Will discuss with hospitalist for admission.  Patient placed on telemetry for close monitoring.  Patient consented for blood products if needed.  _________________________ 4:04 AM on  02/18/2020 -----------------------------------------  CT visualized by me with no signs of active bleeding, confirmed by radiology.  Patient remains hemodynamically stable and admitted to the hospitalist service.    _____________________________________________ Please note:  Patient was evaluated in Emergency Department today for the symptoms described in the history of present illness. Patient was evaluated in the context of the global COVID-19 pandemic, which necessitated consideration  that the patient might be at risk for infection with the SARS-CoV-2 virus that causes COVID-19. Institutional protocols and algorithms that pertain to the evaluation of patients at risk for COVID-19 are in a state of rapid change based on information released by regulatory bodies including the CDC and federal and state organizations. These policies and algorithms were followed during the patient's care in the ED.  Some ED evaluations and interventions may be delayed as a result of limited staffing during the pandemic.   [place] Controlled Substance Database was reviewed by me. ____________________________________________   FINAL CLINICAL IMPRESSION(S) / ED DIAGNOSES   Final diagnoses:  Lower GI bleed      NEW MEDICATIONS STARTED DURING THIS VISIT:  ED Discharge Orders    None       Note:  This document was prepared using Dragon voice recognition software and may include unintentional dictation errors.    Alfred Levins, [place], MD 02/18/20 [phone]

## 2020-02-18 NOTE — ED Notes (Signed)
Pt given meal tray.

## 2020-02-18 NOTE — Consult Note (Signed)
Consultation  Referring Provider:  Dr. Damita Dunnings Admit date: 11/28 Consult date: 11/28         Reason for Consultation:   Hematochezia         HPI:   [name] is a 86 y.o. lady with history of a. Fib s/p a. Fib ablation with av nodal ablation here with 3-4 day history of hematochezia. Baseline hemoglobin is about 12 which is were she is currently. She states every six weeks or so she will have a day of a large amount of hematochezia but it usually stops the next day. She had a colonoscopy in 2019 with fair prep but several polyps removed. No mention of diverticulosis. Did have rectal nodule that was apparently followed as an outpatient and was deemed to be nothing. She takes eliquis with last dose being last night. She denies constipation but has large stool burden on CT scan and her colonoscopy prep was fair even after extensive prep per her. She was being worked up for [place] with the colonoscopy and had an endoscopy which showed a hiatal hernia and a video capsule which showed small avm. States she is having 4-5 bloody bowel movements daily. Denies any other symptoms such as upper gi symptoms, abdominal pain, fevers, chills or any other significant symptoms.   Past Medical History:  Diagnosis Date  . Allergic rhinitis   . 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 Arthritis   . Atrial fibrillation -persistent cardiologist-  dr klein/  primary EP -- dr Tawanna Sat ([facility])   a. s/p PVI [name] 2010;  b. on tikosyn/coumadin;  c. 05/2009 Echo: EF 60-65%, Gr 2 DD. (first dx 09/ 2007)  . Bilateral lower extremity edema   . Bleeding hemorrhoid   . Carotid stenosis    mild (hosp 3/11)- consult by vasc/ Dr Donnetta Hutching  . Complication of anesthesia    hard to wake  . Diverticulosis of colon   . Dyspnea    on exertion-climbing stairs  . Fatty liver   . H/O cardiac radiofrequency ablation    01/ 2008 at [facility] /  03/ 2010  at [facility]  . Heart failure with preserved ejection fraction ([place])   . History  of adenomatous polyp of colon    tubular adenoma's  . History of cardiomyopathy    secondary tachycardia-induced cardiomyopathy -- resolved 2014  . History of squamous cell carcinoma in situ (SCCIS) of skin    05/ 2017  nasal bridge and right medial knee  . History of transient ischemic attack (TIA)    01-24-2005 and 06-12-2009  . Hyperlipidemia   . Hypothyroidism   . Mild intermittent asthma    reacts to cats  . Mixed stress and urge urinary incontinence   . Pulmonary nodule   . S/P AV nodal ablation 07/28/19 07/29/2019  . S/P mitral valve repair 10-23-1998  dr Boyce Medici at [facility]   for MVP and regurg. (annuloplasty ring procedure)  . S/P placement of cardiac pacemaker MDT 07/28/19 07/29/2019    Past Surgical History:  Procedure Laterality Date  . APPENDECTOMY  1978  . AV NODE ABLATION N/A 07/28/2019   Procedure: AV NODE ABLATION;  Surgeon: Deboraha Sprang, MD;  Location: [place] [facility];  Service: Cardiovascular;  Laterality: N/A;  . BUBBLE STUDY  06/19/2019   Procedure: BUBBLE STUDY;  Surgeon: Pixie Casino, MD;  Location: [place];  Service: Cardiovascular;;  . CARDIAC ELECTROPHYSIOLOGY [place]  01/ 2008    at  [place]   right-sided ablation atrial flutter  . CARDIAC ELECTROPHYSIOLOGY STUDY AND ABLATION  03/ 2010   dr Jaymes Graff at [facility]   AV node ablation and pulmonary vein isolation for atrial fib  . CARDIOVERSION  06-18-2006;  07-13-2006;  10-19-2010;  10-27-2010  . COLONOSCOPY    . COLONOSCOPY WITH PROPOFOL N/A 10/13/2017   Procedure: COLONOSCOPY WITH PROPOFOL;  Surgeon: [name], MD;  Location: [facility] ENDOSCOPY;  Service: Gastroenterology;  Laterality: N/A;  . CYSTO/ TRANSURETHRAL COLLAGEN INJECTION THERAPY  07-26-2007   dr Matilde Sprang  . DILATION AND CURETTAGE OF UTERUS    . ESOPHAGOGASTRODUODENOSCOPY (EGD) WITH PROPOFOL N/A 10/13/2017   Procedure: ESOPHAGOGASTRODUODENOSCOPY (EGD) WITH PROPOFOL;  Surgeon: [name], MD;  Location: [facility]  ENDOSCOPY;  Service: Gastroenterology;  Laterality: N/A;  . EXCISIONAL HEMORRHOIDECTOMY  1980s  . GIVENS CAPSULE STUDY N/A 12/08/2017   Procedure: GIVENS CAPSULE STUDY;  Surgeon: [name], MD;  Location: [facility] ENDOSCOPY;  Service: Gastroenterology;  Laterality: N/A;  . HEMORRHOID SURGERY N/A 10/29/2016   Procedure: HEMORRHOIDECTOMY;  Surgeon: Leighton Ruff, MD;  Location: [facility];  Service: General;  Laterality: N/A;  . MITRAL VALVE ANNULOPLASTY  10/23/1998   "Model [id number]; Campbell Lerner [id number]"; size 70mm; [facility]; Dr. Boyce Medici  . PACEMAKER IMPLANT N/A 07/28/2019   Procedure: PACEMAKER IMPLANT;  Surgeon: Deboraha Sprang, MD;  Location: [place] [facility];  Service: Cardiovascular;  Laterality: N/A;  . [place]  . TEE WITH CARDIOVERSION  05-06-2006 at [facility];  01-02-2013 at [facility]  . TEE WITHOUT CARDIOVERSION N/A 06/19/2019   Procedure: TRANSESOPHAGEAL ECHOCARDIOGRAM (TEE);  Surgeon: Pixie Casino, MD;  Location: [facility] ENDOSCOPY;  Service: Cardiovascular;  Laterality: N/A;  . TOTAL HIP ARTHROPLASTY Left 05/04/2017   Procedure: LEFT TOTAL HIP ARTHROPLASTY ANTERIOR APPROACH;  Surgeon: Mcarthur Rossetti, MD;  Location: [place];  Service: Orthopedics;  Laterality: Left;  . TRANSTHORACIC ECHOCARDIOGRAM  05-01-2015   dr Caryl Comes   ef 50-55%/  mild AV sclerosis without stenosis/  post MV repair with mild central MR (valve area by pressure half-time 2cm^2,  valve area by continutity equation 0.91cm^2, peak grandiant 23mmHg)/  severe LAE/ mild TR/ mild RAE   . TUBAL LIGATION Bilateral 1978    Family History  Problem Relation Age of Onset  . Lung cancer Father        smoker, died at 80  . Alcohol abuse Father   . Cancer Father        bladder and lung CA smoker  . Breast cancer Neg Hx   . Stroke Neg Hx     Social History   Tobacco Use  . Smoking status: Never Smoker  . Smokeless tobacco: Never Used  Vaping Use  . Vaping Use: Never used  Substance Use Topics   . Alcohol use: Yes    Alcohol/week: 0.0 standard drinks    Comment: seldom  . Drug use: No    Prior to Admission medications   Medication Sig Start Date End Date Taking? Authorizing Provider  acetaminophen (TYLENOL) 325 MG tablet Take 325 mg by mouth every 6 (six) hours as needed for moderate pain or headache.   Yes [provider]  albuterol (PROVENTIL HFA;VENTOLIN HFA) 108 (90 Base) MCG/ACT inhaler Inhale 2 puffs into the lungs every 4 (four) hours as needed for wheezing or shortness of breath. 06/23/16  Yes Tower, Wynelle Fanny, MD  diclofenac Sodium (VOLTAREN) 1 % GEL Apply 1 application topically 4 (four) times daily as needed (pain).  Yes [provider]  diphenhydrAMINE (BENADRYL) 25 mg capsule Take 50 mg by mouth every 6 (six) hours as needed for itching.   Yes [provider]  diphenhydrAMINE-zinc acetate (BENADRYL) cream Apply 1 application topically 3 (three) times daily as needed for itching.   Yes [provider]  ELIQUIS 5 MG TABS tablet TAKE 1 TABLET TWICE DAILY 12/13/19  Yes Deboraha Sprang, MD  ezetimibe (ZETIA) 10 MG tablet Take 1 tablet (10 mg total) by mouth daily. 09/13/19  Yes Deboraha Sprang, MD  ferrous sulfate 325 (65 FE) MG EC tablet TAKE 1 TABLET EVERY DAY WITH BREAKFAST 11/28/19  Yes Tower, Wynelle Fanny, MD  fluticasone (FLONASE) 50 MCG/ACT nasal spray Place 1 spray into both nostrils daily as needed for allergies.   Yes [provider]  furosemide (LASIX) 40 MG tablet Take 40 mg by mouth daily as needed for edema.   Yes [provider]  levothyroxine (SYNTHROID) 25 MCG tablet TAKE 1 TABLET EVERY DAY BEFORE BREAKFAST 11/28/19  Yes Tower, Wynelle Fanny, MD  loratadine (CLARITIN) 10 MG tablet Take 10 mg by mouth daily as needed for allergies.    Yes [provider]  Polyethyl Glycol-Propyl Glycol (LUBRICANT EYE DROPS) 0.4-0.3 % SOLN Place 1-2 drops into both eyes 3 (three) times daily as needed (for dry eyes.).   Yes [provider]    Current Facility-Administered Medications  Medication Dose Route Frequency Provider Last Rate Last Admin  . 0.9 %  sodium chloride infusion  10 mL/hr Intravenous Once Alfred Levins, [place], MD      . 0.9 %  sodium chloride infusion   Intravenous Continuous Athena Masse, MD 100 mL/hr at 02/18/20 0813 New Bag at 02/18/20 0813  . morphine 2 MG/ML injection 2 mg  2 mg Intravenous Q2H PRN Athena Masse, MD      . ondansetron [facility]) tablet 4 mg  4 mg Oral Q6H PRN Athena Masse, MD       Or  . ondansetron [facility]) injection 4 mg  4 mg Intravenous Q6H PRN Athena Masse, MD       Current Outpatient Medications  Medication Sig Dispense Refill  . acetaminophen (TYLENOL) 325 MG tablet Take 325 mg by mouth every 6 (six) hours as needed for moderate pain or headache.    . albuterol (PROVENTIL HFA;VENTOLIN HFA) 108 (90 Base) MCG/ACT inhaler Inhale 2 puffs into the lungs every 4 (four) hours as needed for wheezing or shortness of breath. 1 Inhaler 0  . diclofenac Sodium (VOLTAREN) 1 % GEL Apply 1 application topically 4 (four) times daily as needed (pain).    [name] diphenhydrAMINE (BENADRYL) 25 mg capsule Take 50 mg by mouth every 6 (six) hours as needed for itching.    . diphenhydrAMINE-zinc acetate (BENADRYL) cream Apply 1 application topically 3 (three) times daily as needed for itching.    [name] ELIQUIS 5 MG TABS tablet TAKE 1 TABLET TWICE DAILY 180 tablet 2  . ezetimibe (ZETIA) 10 MG tablet Take 1 tablet (10 mg total) by mouth daily. 90 tablet 3  . ferrous sulfate 325 (65 FE) MG EC tablet TAKE 1 TABLET EVERY DAY WITH BREAKFAST 90 tablet 3  . fluticasone (FLONASE) 50 MCG/ACT nasal spray Place 1 spray into both nostrils daily as needed for allergies.    . furosemide (LASIX) 40 MG tablet Take 40 mg by mouth daily as needed for edema.    [name] levothyroxine (SYNTHROID) 25 MCG tablet TAKE 1 TABLET EVERY  DAY BEFORE BREAKFAST 90 tablet 3  . loratadine (CLARITIN) 10 MG tablet Take 10 mg by mouth  daily as needed for allergies.     Vladimir Faster Glycol-Propyl Glycol (LUBRICANT EYE DROPS) 0.4-0.3 % SOLN Place 1-2 drops into both eyes 3 (three) times daily as needed (for dry eyes.).      Allergies as of 02/18/2020 - Review Complete 02/18/2020  Allergen Reaction Noted  . Amiodarone hcl Swelling   . Penicillins Rash 04/27/2007  . Statins Rash      Review of Systems:    All systems reviewed and negative except where noted in HPI.  Review of Systems  Constitutional: Negative for fever.  Respiratory: Negative for cough.   Cardiovascular: Negative for chest pain and palpitations.  Gastrointestinal: Positive for blood in stool. Negative for abdominal pain, melena, nausea and vomiting.  Genitourinary: Negative for dysuria.  Psychiatric/Behavioral: Negative for substance abuse.  All other systems reviewed and are negative.     Physical Exam:  Vital signs in last 24 hours: Temp:  [98 F (36.7 C)] 98 F (36.7 C) (11/28 0201) Pulse Rate:  [69-75] 73 (11/28 1130) Resp:  [12-20] 13 (11/28 1130) BP: (129-151)/(63-88) 147/66 (11/28 1130) SpO2:  [94 %-100 %] 96 % (11/28 1130) Weight:  [75.8 kg] 75.8 kg (11/28 0159)   General:   Pleasant in NAD Head:  Normocephalic and atraumatic. Eyes:   No icterus.   Conjunctiva pink. Mouth: Mucosa pink moist, no lesions. Neck:  Supple; no masses felt Lungs: No respiratory distress Abdomen:   Flat, soft, nondistended, nontender Rectal:  Dried blood on anal canal, no tears or hemorrhoids Msk:  No clubbing or cyanosis Neurologic:  Alert and  oriented x4;  No focal deficits Skin:  Warm, dry, pink without significant lesions or rashes. Psych:  Alert and cooperative. Normal affect.  LAB RESULTS: Recent Labs    02/18/20 0210 02/18/20 0810  WBC 6.3 4.4  HGB 13.2 11.7*  HCT 41.3 36.7  PLT 160 136*   BMET Recent Labs    02/18/20 0210  NA 143  K 3.9  CL 106  CO2 26  GLUCOSE 110*  BUN 36*  CREATININE 0.80  CALCIUM 9.3   LFT Recent  Labs    02/18/20 0210  PROT 7.5  ALBUMIN 4.0  AST 35  ALT 24  ALKPHOS 43  BILITOT 0.7   PT/INR Recent Labs    02/18/20 0210  LABPROT 17.1*  INR 1.5*    STUDIES: CT Angio Abd/Pel W and/or Wo Contrast  Result Date: 02/18/2020 CLINICAL DATA:  GI bleed.  Rectal bleeding. EXAM: CTA ABDOMEN AND PELVIS WITHOUT AND WITH CONTRAST TECHNIQUE: Multidetector CT imaging of the abdomen and pelvis was performed using the standard protocol during bolus administration of intravenous contrast. Multiplanar reconstructed images and MIPs were obtained and reviewed to evaluate the vascular anatomy. CONTRAST:  160mL OMNIPAQUE IOHEXOL 350 MG/ML SOLN COMPARISON:  CT dated February 28, 2013 FINDINGS: VASCULAR Aorta: There are atherosclerotic changes of the abdominal aorta without evidence for an aneurysm Celiac: Patent without evidence of aneurysm, dissection, vasculitis or significant stenosis. SMA: Patent without evidence of aneurysm, dissection, vasculitis or significant stenosis. There is a replaced right hepatic artery, a normal variant. Renals: Both renal arteries are patent without evidence of aneurysm, dissection, vasculitis, fibromuscular dysplasia or significant stenosis. IMA: Patent without evidence of aneurysm, dissection, vasculitis or significant stenosis. Inflow: Patent without evidence of aneurysm, dissection, vasculitis or significant stenosis. Proximal Outflow: Bilateral common femoral and visualized portions of the superficial  and profunda femoral arteries are patent without evidence of aneurysm, dissection, vasculitis or significant stenosis. Veins: No obvious venous abnormality within the limitations of this arterial phase study. Review of the MIP images confirms the above findings. NON-VASCULAR Lower chest: The lung bases are clear. The heart size is normal. Hepatobiliary: The liver is normal. Normal gallbladder.There is no biliary ductal dilation. Pancreas: Small calcifications are noted at the  pancreatic head. There is no definite underlying pancreatic mass. Spleen: Unremarkable. Adrenals/Urinary Tract: --Adrenal glands: Unremarkable. --Right kidney/ureter: No hydronephrosis or radiopaque kidney stones. --Left kidney/ureter: No hydronephrosis or radiopaque kidney stones. --Urinary bladder: Unremarkable. Stomach/Bowel: --Stomach/Duodenum: There is a small hiatal hernia. --Small bowel: Unremarkable. --Colon: There is scattered colonic diverticula without CT evidence for diverticulitis. There is a large amount of stool in the colon. --Appendix: Not visualized. No right lower quadrant inflammation or free fluid. Lymphatic: --No retroperitoneal lymphadenopathy. --No mesenteric lymphadenopathy. --No pelvic or inguinal lymphadenopathy. Reproductive: Unremarkable Other: No ascites or free air. The abdominal wall is normal. Musculoskeletal. No acute displaced fractures. IMPRESSION: 1. No evidence for active GI bleeding. 2. Colonic diverticulosis without CT evidence for diverticulitis. 3. Small hiatal hernia. 4. Small calcifications at the pancreatic head may be secondary to chronic pancreatitis. 5. Large amount of stool in the colon. Aortic Atherosclerosis (ICD10-I70.0). Electronically Signed   By: Constance Holster M.D.   On: 02/18/2020 03:57       Impression / Plan:   86 y/o lady with history of a. Fib with ablation and subsequent nodal ablation on eliquis here with 3-4 day history of hematochezia. Hemodynamically stable and hemoglobin around base line. Differential includes diverticular, hemorrhoids, or rectal dieulafoy.  - place on clear liquids - recommend starting miralax given CT findings of large stool burden - maintain active type and screen - transfuse if hemoglobin < 7 - two large bore IV's - IV fluids - check CBC's regularly - recheck INR tomorrow morning  Given last dose of eliquis was last night and is hemodynamically stable, will continue to monitor for now. Will potentially  consider colonoscopy on Tuesday depending on clinical course. Please call if any questions or concerns.  Raylene Miyamoto MD, MPH [place]

## 2020-02-18 NOTE — ED Notes (Signed)
Pt resting comfortably in bed

## 2020-02-18 NOTE — ED Notes (Signed)
Pt up to use bathroom 

## 2020-02-18 NOTE — ED Notes (Signed)
Patient transported to CT at this time. 

## 2020-02-19 DIAGNOSIS — Z8673 Personal history of transient ischemic attack (TIA), and cerebral infarction without residual deficits: Secondary | ICD-10-CM

## 2020-02-19 LAB — CBC
HCT: 37.8 % (ref 36.0–46.0)
Hemoglobin: 11.9 g/dL — ABNORMAL LOW (ref 12.0–15.0)
MCH: 27.9 pg (ref 26.0–34.0)
MCHC: 31.5 g/dL (ref 30.0–36.0)
MCV: 88.7 fL (ref 80.0–100.0)
Platelets: 134 10*3/uL — ABNORMAL LOW (ref 150–400)
RBC: 4.26 MIL/uL (ref 3.87–5.11)
RDW: 14.8 % (ref 11.5–15.5)
WBC: 4 10*3/uL (ref 4.0–10.5)
nRBC: 0 % (ref 0.0–0.2)

## 2020-02-19 MED ORDER — PEG 3350-KCL-NA BICARB-NACL 420 G PO SOLR
4000.0000 mL | Freq: Once | ORAL | Status: AC
  Administered 2020-02-19: 4000 mL via ORAL
  Filled 2020-02-19: qty 4000

## 2020-02-19 NOTE — Progress Notes (Signed)
PROGRESS NOTE    [name]  ZSW:[id number] DOB: 01/22/1933 DOA: 02/18/2020 PCP: Abner Greenspan, MD    Brief Narrative:  86 y.o. female with medical history significant for A. fib on Eliquis status post AV nodal ablation and pacemaker placement May 2021, history of GI bleed, with multiple polyps on colonoscopy 3220, diastolic heart failure and hypothyroidism as well as history of TIA, who presents to the emergency room with a 2-day history of episodes of bright red blood per rectum, initially small amounts mixed in with stool, but has become heavier and more frequent. She denies dizziness or lightheadedness, palpitations or shortness of breath and denies chest pain or abdominal pain. Pt was admitted for further workup  Assessment & Plan:   Principal Problem:   Hematochezia Active Problems:   Hypothyroidism   Atrial fibrillation, chronic (HCC)   COLONIC POLYPS, ADENOMATOUS, HX OF   (HFpEF) heart failure with preserved ejection fraction ([place])   Long term (current) use of anticoagulants   S/P AV nodal ablation 07/28/19   S/P placement of cardiac pacemaker MDT 07/28/19   History of TIA (transient ischemic attack)    Hematochezia:   Colonic polyps, adenomatous, history of, colonoscopy 2019   Long term (current) use of anticoagulants -Patient on systemic anticoagulation with Eliquis with history of GI bleeding: Polyps presenting with 2-day history of several episodes of hematochezia, hemodynamically stable with hemoglobin 13 at time of presentation -GI consulted and is following -GI had planned for continuing to monitor h/h and transfuse for hgb<7. Potential for colonoscopy Tues pending clinical course. Also recommendation for cathartics given large stool burden on CT abd    Hypothyroidism -Have continued home thyroid replacement    Atrial fibrillation, chronic, on Eliquis   S/P AV nodal ablation 07/28/19 -Hold Eliquis due to GI bleed -Currently rate controlled -Would resume  anticoagulation when OK with GI    S/P placement of cardiac pacemaker MDT 07/28/19 -seems to be stable at this time    History of TIA (transient ischemic attack) -Cont to hold antiplatelet. Cont home zetia   (HFpEF) heart failure with preserved ejection fraction (HCC) -Currently euvolemic at this time  DVT prophylaxis: SCD's Code Status: Full Family Communication: Pt in room, family not at bedside  Status is: Inpatient  Remains inpatient appropriate because:Ongoing diagnostic testing needed not appropriate for outpatient work up and Inpatient level of care appropriate due to severity of illness   Dispo: The patient is from: Home              Anticipated d/c is to: Home              Anticipated d/c date is: 2 days              Patient currently is not medically stable to d/c.   Consultants:   GI  Procedures:     Antimicrobials: Anti-infectives (From admission, onward)   None      Subjective: Denies abd pain or nausea  Objective: Vitals:   02/19/20 0338 02/19/20 0440 02/19/20 0725 02/19/20 1110  BP: 135/64  (!) 146/70 (!) 151/71  Pulse: 75  75 68  Resp: 17  17 17   Temp: 97.6 F (36.4 C)  97.8 F (36.6 C) 97.8 F (36.6 C)  TempSrc: Oral  Oral Oral  SpO2: 97%  97% 98%  Weight:  76.4 kg    Height:        Intake/Output Summary (Last 24 hours) at 02/19/2020 1414 Last data filed at 02/19/2020  1000 Gross per 24 hour  Intake 2526.27 ml  Output 1600 ml  Net 926.27 ml   Filed Weights   02/18/20 0159 02/18/20 1514 02/19/20 0440  Weight: 75.8 kg 75 kg 76.4 kg    Examination: General exam: Conversant, in no acute distress Respiratory system: normal chest rise, clear, no audible wheezing Cardiovascular system: regular rhythm, s1-s2 Gastrointestinal system: Nondistended, nontender, pos BS Central nervous system: No seizures, no tremors Extremities: No cyanosis, no joint deformities Skin: No rashes, no pallor Psychiatry: Affect normal // no auditory  hallucinations   Data Reviewed: I have personally reviewed following labs and imaging studies  CBC: Recent Labs  Lab 02/18/20 0210 02/18/20 0810 02/18/20 1400 02/19/20 0649  WBC 6.3 4.4 4.2 4.0  NEUTROABS 3.8  --   --   --   HGB 13.2 11.7* 12.1 11.9*  HCT 41.3 36.7 38.5 37.8  MCV 87.5 88.0 87.7 88.7  PLT 160 136* 147* 409*   Basic Metabolic Panel: Recent Labs  Lab 02/18/20 0210  NA 143  K 3.9  CL 106  CO2 26  GLUCOSE 110*  BUN 36*  CREATININE 0.80  CALCIUM 9.3   GFR: Estimated Creatinine Clearance: 52.6 mL/min (by C-G formula based on SCr of 0.8 mg/dL). Liver Function Tests: Recent Labs  Lab 02/18/20 0210  AST 35  ALT 24  ALKPHOS 43  BILITOT 0.7  PROT 7.5  ALBUMIN 4.0   No results for input(s): LIPASE, AMYLASE in the last 168 hours. No results for input(s): AMMONIA in the last 168 hours. Coagulation Profile: Recent Labs  Lab 02/18/20 0210  INR 1.5*   Cardiac Enzymes: No results for input(s): CKTOTAL, CKMB, CKMBINDEX, TROPONINI in the last 168 hours. BNP (last 3 results) No results for input(s): PROBNP in the last 8760 hours. HbA1C: No results for input(s): HGBA1C in the last 72 hours. CBG: No results for input(s): GLUCAP in the last 168 hours. Lipid Profile: No results for input(s): CHOL, HDL, LDLCALC, TRIG, CHOLHDL, LDLDIRECT in the last 72 hours. Thyroid Function Tests: No results for input(s): TSH, T4TOTAL, FREET4, T3FREE, THYROIDAB in the last 72 hours. Anemia Panel: No results for input(s): VITAMINB12, FOLATE, FERRITIN, TIBC, IRON, RETICCTPCT in the last 72 hours. Sepsis Labs: Recent Labs  Lab 02/18/20 0210  LATICACIDVEN 1.1    Recent Results (from the past 240 hour(s))  Resp Panel by RT-PCR (Flu A&B, Covid) Nasopharyngeal Swab     Status: None   Collection Time: 02/18/20  1:58 AM   Specimen: Nasopharyngeal Swab; Nasopharyngeal(NP) swabs in vial transport medium  Result Value Ref Range Status   SARS Coronavirus 2 by RT PCR NEGATIVE  NEGATIVE Final    Comment: (NOTE) SARS-CoV-2 target nucleic acids are NOT DETECTED.  The SARS-CoV-2 RNA is generally detectable in upper respiratory specimens during the acute phase of infection. The lowest concentration of SARS-CoV-2 viral copies this assay can detect is 138 copies/mL. A negative result does not preclude SARS-Cov-2 infection and should not be used as the sole basis for treatment or other patient management decisions. A negative result may occur with  improper specimen collection/handling, submission of specimen other than nasopharyngeal swab, presence of viral mutation(s) within the areas targeted by this assay, and inadequate number of viral copies(<138 copies/mL). A negative result must be combined with clinical observations, patient history, and epidemiological information. The expected result is Negative.  Fact Sheet for Patients:  EntrepreneurPulse.com.au  Fact Sheet for Healthcare Providers:  IncredibleEmployment.be  This test is no t yet approved or cleared  by the [place] and  has been authorized for detection and/or diagnosis of SARS-CoV-2 by FDA under an Emergency Use Authorization (EUA). This EUA will remain  in effect (meaning this test can be used) for the duration of the COVID-19 declaration under Section 564(b)(1) of the Act, 21 U.S.C.section 360bbb-3(b)(1), unless the authorization is terminated  or revoked sooner.       Influenza A by PCR NEGATIVE NEGATIVE Final   Influenza B by PCR NEGATIVE NEGATIVE Final    Comment: (NOTE) The Xpert Xpress SARS-CoV-2/FLU/RSV plus assay is intended as an aid in the diagnosis of influenza from Nasopharyngeal swab specimens and should not be used as a sole basis for treatment. Nasal washings and aspirates are unacceptable for Xpert Xpress SARS-CoV-2/FLU/RSV testing.  Fact Sheet for Patients: EntrepreneurPulse.com.au  Fact Sheet for Healthcare  Providers: IncredibleEmployment.be  This test is not yet approved or cleared by the [place] FDA and has been authorized for detection and/or diagnosis of SARS-CoV-2 by FDA under an Emergency Use Authorization (EUA). This EUA will remain in effect (meaning this test can be used) for the duration of the COVID-19 declaration under Section 564(b)(1) of the Act, 21 U.S.C. section 360bbb-3(b)(1), unless the authorization is terminated or revoked.  Performed at [facility], [address], [place], [place] [address]      Radiology Studies: CT Angio Abd/Pel W and/or Wo Contrast  Result Date: 02/18/2020 CLINICAL DATA:  GI bleed.  Rectal bleeding. EXAM: CTA ABDOMEN AND PELVIS WITHOUT AND WITH CONTRAST TECHNIQUE: Multidetector CT imaging of the abdomen and pelvis was performed using the standard protocol during bolus administration of intravenous contrast. Multiplanar reconstructed images and MIPs were obtained and reviewed to evaluate the vascular anatomy. CONTRAST:  144mL OMNIPAQUE IOHEXOL 350 MG/ML SOLN COMPARISON:  CT dated February 28, 2013 FINDINGS: VASCULAR Aorta: There are atherosclerotic changes of the abdominal aorta without evidence for an aneurysm Celiac: Patent without evidence of aneurysm, dissection, vasculitis or significant stenosis. SMA: Patent without evidence of aneurysm, dissection, vasculitis or significant stenosis. There is a replaced right hepatic artery, a normal variant. Renals: Both renal arteries are patent without evidence of aneurysm, dissection, vasculitis, fibromuscular dysplasia or significant stenosis. IMA: Patent without evidence of aneurysm, dissection, vasculitis or significant stenosis. Inflow: Patent without evidence of aneurysm, dissection, vasculitis or significant stenosis. Proximal Outflow: Bilateral common femoral and visualized portions of the superficial and profunda femoral arteries are patent without evidence of aneurysm,  dissection, vasculitis or significant stenosis. Veins: No obvious venous abnormality within the limitations of this arterial phase study. Review of the MIP images confirms the above findings. NON-VASCULAR Lower chest: The lung bases are clear. The heart size is normal. Hepatobiliary: The liver is normal. Normal gallbladder.There is no biliary ductal dilation. Pancreas: Small calcifications are noted at the pancreatic head. There is no definite underlying pancreatic mass. Spleen: Unremarkable. Adrenals/Urinary Tract: --Adrenal glands: Unremarkable. --Right kidney/ureter: No hydronephrosis or radiopaque kidney stones. --Left kidney/ureter: No hydronephrosis or radiopaque kidney stones. --Urinary bladder: Unremarkable. Stomach/Bowel: --Stomach/Duodenum: There is a small hiatal hernia. --Small bowel: Unremarkable. --Colon: There is scattered colonic diverticula without CT evidence for diverticulitis. There is a large amount of stool in the colon. --Appendix: Not visualized. No right lower quadrant inflammation or free fluid. Lymphatic: --No retroperitoneal lymphadenopathy. --No mesenteric lymphadenopathy. --No pelvic or inguinal lymphadenopathy. Reproductive: Unremarkable Other: No ascites or free air. The abdominal wall is normal. Musculoskeletal. No acute displaced fractures. IMPRESSION: 1. No evidence for active GI bleeding. 2. Colonic diverticulosis without CT  evidence for diverticulitis. 3. Small hiatal hernia. 4. Small calcifications at the pancreatic head may be secondary to chronic pancreatitis. 5. Large amount of stool in the colon. Aortic Atherosclerosis (ICD10-I70.0). Electronically Signed   By: Constance Holster M.D.   On: 02/18/2020 03:57    Scheduled Meds: . ezetimibe  10 mg Oral Daily  . levothyroxine  25 mcg Oral Q0600   Continuous Infusions: . sodium chloride    . sodium chloride 100 mL/hr at 02/19/20 1246     LOS: 1 day   Marylu Lund, MD [facility] Pager On Amion  If  7PM-7AM, please contact night-coverage 02/19/2020, 2:14 PM

## 2020-02-19 NOTE — TOC Initial Note (Signed)
Transition of Care [facility]) - Initial/Assessment Note    Patient Details  Name: [name] MRN: [id number] Date of Birth: October 01, 1932  Transition of Care [facility]) CM/SW Contact:    Kerin Salen, RN Phone Number: 02/19/2020, 10:35 AM  Clinical Narrative:    Very pleasant 86yo patient, alert and oriented x4, lives alone, however Son is there sometimes. Patient states she is still driving able to keep scheduled appointments with Dr. Glori Bickers. [facility] [place]. Independent with ADL's , cooking and shopping. Patient voices good support system, family, church and 32 grandkids, (24- grands and 13-great grands). Patient voices having quality time with grand kids. Denies use of assistive devices, nor any other medical equipment in the home.                 Expected Discharge Plan: Home/Self Care Barriers to Discharge: Continued Medical Work up   Patient Goals and CMS Choice Patient states their goals for this hospitalization and ongoing recovery are:: Return home   Choice offered to / list presented to : NA  Expected Discharge Plan and Services Expected Discharge Plan: Home/Self Care In-house Referral: NA Discharge Planning Services: NA Post Acute Care Choice: NA Living arrangements for the past 2 months: Single Family Home                 DME Arranged: N/A DME Agency: NA       HH Arranged: NA HH Agency: NA        Prior Living Arrangements/Services Living arrangements for the past 2 months: Single Family Home Lives with:: Adult Children Patient language and need for interpreter reviewed:: Yes Do you feel safe going back to the place where you live?: Yes      Need for Family Participation in Patient Care: No (Comment) Care giver support system in place?: Yes (comment) Current home services: Other (comment) (None) Criminal Activity/Legal Involvement Pertinent to Current Situation/Hospitalization: No - Comment as needed  Activities of Daily Living Home Assistive  Devices/Equipment: None ADL Screening (condition at time of admission) Patient's cognitive ability adequate to safely complete daily activities?: Yes Is the patient deaf or have difficulty hearing?: Yes Does the patient have difficulty seeing, even when wearing glasses/contacts?: No Does the patient have difficulty concentrating, remembering, or making decisions?: Yes Patient able to express need for assistance with ADLs?: Yes Does the patient have difficulty dressing or bathing?: No Independently performs ADLs?: Yes (appropriate for developmental age) Does the patient have difficulty walking or climbing stairs?: No Weakness of Legs: None Weakness of Arms/Hands: None  Permission Sought/Granted Permission sought to share information with : Case Manager                Emotional Assessment Appearance:: Appears younger than stated age Attitude/Demeanor/Rapport: Engaged, Self-Confident Affect (typically observed): Accepting, Adaptable Orientation: : Oriented to Self, Oriented to Place, Oriented to  Time, Oriented to Situation Alcohol / Substance Use: Not Applicable Psych Involvement: No (comment)  Admission diagnosis:  Rectal bleeding [K62.5] Lower GI bleed [K92.2] Patient Active Problem List   Diagnosis Date Noted  . Rectal bleeding 02/18/2020  . History of TIA (transient ischemic attack) 02/18/2020  . S/P AV nodal ablation 07/28/19 07/29/2019  . S/P placement of cardiac pacemaker MDT 07/28/19 07/29/2019  . AV block 07/28/2019  . CVA (cerebral vascular accident) ([place]) 06/16/2019  . Facial tingling 11/29/2018  . Tremor of left hand 11/29/2018  . Medicare annual wellness visit, subsequent 11/24/2018  . Dysuria 02/06/2018  . Iron  deficiency anemia 10/21/2017  . Rapid atrial fibrillation ([place]) 08/19/2017  . Constipation 08/02/2017  . Numbness and tingling 07/14/2017  . Paresthesia 07/14/2017  . Unilateral primary osteoarthritis, left hip 05/04/2017  . Status post total  replacement of left hip 05/04/2017  . Hip osteoarthritis 04/27/2017  . Long term (current) use of anticoagulants 03/04/2017  . Venous stasis dermatitis of both lower extremities 01/08/2017  . Impacted cerumen of right ear 11/20/2016  . Osteopenia 10/25/2016  . Pedal edema 08/26/2016  . Varicose veins of both lower extremities 08/26/2016  . Estrogen deficiency 08/26/2016  . Screening mammogram, encounter for 08/26/2016  . Hemorrhoids 08/26/2016  . History of nonmelanoma skin cancer 01/01/2016  . Hip pain 08/02/2014  . Left knee pain 08/02/2014  . Chronic cough 05/08/2014  . Hematochezia 04/09/2014  . Caregiver stress 08/16/2013  . Colon cancer screening 08/16/2013  . Encounter for therapeutic drug monitoring 04/20/2013  . Left ovarian cyst 03/14/2013  . (HFpEF) heart failure with preserved ejection fraction ([place]) 12/27/2012  . Palpitations 04/15/2012  . Cardiomyopathy, secondary --Resolved again 10/14 10/13/2010  . COLONIC POLYPS, ADENOMATOUS, HX OF 09/18/2009  . PULMONARY NODULE 12/20/2008  . GANGLION CYST 10/04/2007  . Hyperlipidemia 04/27/2007  . Depression with anxiety 04/27/2007  . Asthma, mild intermittent 04/27/2007  . INSOMNIA 04/27/2007  . ADENOMATOUS COLONIC POLYP 11/04/2006  . Hypothyroidism 09/02/2006  . Atrial fibrillation, chronic ([place]) 08/05/2006   PCP:  Abner Greenspan, MD Pharmacy:   [id number] [place], [place] [address] Phone: [phone] Fax: [phone]  [place], [place] - [address] [place] [address] Phone: [phone] Fax: [facility] [place], [place] [address] Phone: [phone] Fax: [phone]     Social Determinants of Health (SDOH) Interventions    Readmission Risk Interventions No flowsheet data found.

## 2020-02-19 NOTE — Progress Notes (Signed)
Mobility Specialist - Progress Note   02/19/20 1550  Mobility  Activity Ambulated in hall  Level of Assistance Independent  Assistive Device None (Pt pushed IV pole)  Distance Ambulated (ft) 190 ft  Mobility Response Tolerated well  Mobility performed by Mobility specialist  $Mobility charge 1 Mobility    Pre-mobility: 80 HR, 97% SpO2 During mobility: 92 HR, 97% SpO2 Post-mobility: 91 HR, 98% SpO2   Pt was lying in bed upon arrival utilizing room air. Pt agreed to session. Pt denied any pain, nausea, or fatigue. Pt was independent in all transfers this date, including ambulation. Pt ambulated to bathroom where she was able to perform hygiene tasks independently. Pt ambulated 190' in room/hallway with no LOB noted. No AD used for session, pt pushed own IV pole. Pt denied SOB, weakness, or dizziness during activity. Overall, pt tolerated session well. Pt was left in bed with all needs in reach.    Kathee Delton Mobility Specialist 02/19/20, 3:53 PM

## 2020-02-19 NOTE — Progress Notes (Signed)
GI Inpatient Follow-up Note  Subjective:  Patient seen and doing well. States bleeding has stopped.  Scheduled Inpatient Medications:  . ezetimibe  10 mg Oral Daily  . levothyroxine  25 mcg Oral Q0600    Continuous Inpatient Infusions:   . sodium chloride    . sodium chloride 100 mL/hr at 02/19/20 1246    PRN Inpatient Medications:  fluticasone, morphine injection, ondansetron **OR** ondansetron (ZOFRAN) IV, polyvinyl alcohol  Review of Systems:  Review of Systems  Constitutional: Negative for chills and fever.  Respiratory: Negative for cough.   Cardiovascular: Negative for chest pain.  Gastrointestinal: Negative for abdominal pain, blood in stool, constipation, diarrhea, heartburn, melena, nausea and vomiting.  Neurological: Negative for focal weakness.  Psychiatric/Behavioral: Negative for substance abuse.  All other systems reviewed and are negative.     Physical Examination: BP (!) 159/86 (BP Location: Right Arm)   Pulse 75   Temp 97.9 F (36.6 C)   Resp 17   Ht 5\' 5"  (1.651 m)   Wt 76.4 kg   SpO2 100%   BMI 28.02 kg/m  Gen: NAD, alert and oriented x 4 HEENT: PEERLA, EOMI, Neck: supple, no JVD or thyromegaly Chest: No respiratory distress Abd: soft, NT, ND Ext: no edema, well perfused with 2+ pulses, Skin: no rash or lesions noted Lymph: no LAD  Data: Lab Results  Component Value Date   WBC 4.0 02/19/2020   HGB 11.9 (L) 02/19/2020   HCT 37.8 02/19/2020   MCV 88.7 02/19/2020   PLT 134 (L) 02/19/2020   Recent Labs  Lab 02/18/20 0810 02/18/20 1400 02/19/20 0649  HGB 11.7* 12.1 11.9*   Lab Results  Component Value Date   NA 143 02/18/2020   K 3.9 02/18/2020   CL 106 02/18/2020   CO2 26 02/18/2020   BUN 36 (H) 02/18/2020   CREATININE 0.80 02/18/2020   Lab Results  Component Value Date   ALT 24 02/18/2020   AST 35 02/18/2020   ALKPHOS 43 02/18/2020   BILITOT 0.7 02/18/2020   Recent Labs  Lab 02/18/20 0210  APTT 37*  INR 1.5*    Assessment/Plan: 86 y/o lady with history of a. Fib with ablation and subsequent nodal ablation on eliquis here with 3-4 day history of hematochezia. Hemodynamically stable and hemoglobin around base line. Differential includes diverticular, hemorrhoids, or rectal dieulafoy. Bleeding stopped so far.  Recommendations:  - will tentatively plan on colonoscopy tomorrow - npo at midnight except prep - golytely split prep tonight  Further recs after procedure   Raylene Miyamoto MD, MPH [place]

## 2020-02-20 ENCOUNTER — Inpatient Hospital Stay: Payer: Medicare Other | Admitting: Anesthesiology

## 2020-02-20 ENCOUNTER — Encounter: Payer: Self-pay | Admitting: Internal Medicine

## 2020-02-20 ENCOUNTER — Encounter
Admission: EM | Disposition: A | Discharge status: Home / Self Care | Payer: Self-pay | Source: Home / Self Care | Attending: Internal Medicine

## 2020-02-20 HISTORY — PX: COLONOSCOPY WITH PROPOFOL: SHX5780

## 2020-02-20 LAB — TYPE AND SCREEN
ABO/RH(D): O POS
Antibody Screen: NEGATIVE
Unit division: 0
Unit division: 0

## 2020-02-20 LAB — BPAM RBC
Blood Product Expiration Date: 202112302359
Blood Product Expiration Date: 202201012359
Unit Type and Rh: 5100
Unit Type and Rh: 5100

## 2020-02-20 LAB — CBC
HCT: 36.9 % (ref 36.0–46.0)
Hemoglobin: 11.9 g/dL — ABNORMAL LOW (ref 12.0–15.0)
MCH: 28.2 pg (ref 26.0–34.0)
MCHC: 32.2 g/dL (ref 30.0–36.0)
MCV: 87.4 fL (ref 80.0–100.0)
Platelets: 137 10*3/uL — ABNORMAL LOW (ref 150–400)
RBC: 4.22 MIL/uL (ref 3.87–5.11)
RDW: 14.9 % (ref 11.5–15.5)
WBC: 4.5 10*3/uL (ref 4.0–10.5)
nRBC: 0 % (ref 0.0–0.2)

## 2020-02-20 LAB — PROTIME-INR
INR: 1.2 (ref 0.8–1.2)
Prothrombin Time: 14.6 seconds (ref 11.4–15.2)

## 2020-02-20 LAB — PREPARE RBC (CROSSMATCH)

## 2020-02-20 SURGERY — COLONOSCOPY WITH PROPOFOL
Anesthesia: General

## 2020-02-20 MED ORDER — PROPOFOL 10 MG/ML IV BOLUS
INTRAVENOUS | Status: DC | PRN
  Administered 2020-02-20: 80 mg via INTRAVENOUS

## 2020-02-20 MED ORDER — EPHEDRINE SULFATE 50 MG/ML IJ SOLN
INTRAMUSCULAR | Status: DC | PRN
  Administered 2020-02-20: 10 mg via INTRAVENOUS

## 2020-02-20 MED ORDER — PHENYLEPHRINE HCL (PRESSORS) 10 MG/ML IV SOLN
INTRAVENOUS | Status: DC | PRN
  Administered 2020-02-20 (×4): 100 ug via INTRAVENOUS

## 2020-02-20 MED ORDER — SODIUM CHLORIDE 0.9 % IV SOLN: INTRAVENOUS | Status: DC

## 2020-02-20 MED ORDER — PROPOFOL 500 MG/50ML IV EMUL
INTRAVENOUS | Status: AC
  Filled 2020-02-20: qty 50

## 2020-02-20 MED ORDER — ACETAMINOPHEN 325 MG PO TABS: 650.0000 mg | ORAL_TABLET | Freq: Four times a day (QID) | ORAL | Status: DC | PRN

## 2020-02-20 MED ORDER — LIDOCAINE HCL (CARDIAC) PF 100 MG/5ML IV SOSY
PREFILLED_SYRINGE | INTRAVENOUS | Status: DC | PRN
  Administered 2020-02-20: 50 mg via INTRATRACHEAL

## 2020-02-20 MED ORDER — LIDOCAINE HCL (PF) 2 % IJ SOLN
INTRAMUSCULAR | Status: AC
  Filled 2020-02-20: qty 5

## 2020-02-20 MED ORDER — PROPOFOL 500 MG/50ML IV EMUL
INTRAVENOUS | Status: DC | PRN
  Administered 2020-02-20: 125 ug/kg/min via INTRAVENOUS

## 2020-02-20 NOTE — Care Plan (Signed)
Colonoscopy performed. Mild diverticulosis, polyps, and hemorrhoids. Bleeding likely hemorrhoidal exacerbated from eliquis. No further GI procedures planned. Ok to restart eliquis tomorrow.  Raylene Miyamoto MD, MPH [facility]

## 2020-02-20 NOTE — Anesthesia Preprocedure Evaluation (Addendum)
Anesthesia Evaluation  Patient identified by MRN, date of birth, ID band Patient awake    Reviewed: Allergy & Precautions, H&P , NPO status , Patient's Chart, lab work & pertinent test results  History of Anesthesia Complications Negative for: history of anesthetic complications  Airway Mallampati: II  TM Distance: >3 FB     Dental  (+) Teeth Intact   Pulmonary shortness of breath, asthma , neg sleep apnea, neg COPD,    breath sounds clear to auscultation       Cardiovascular (-) angina(-) Past MI and (-) Cardiac Stents + dysrhythmias (AV block s/p pacemaker) + pacemaker + Cardiac Defibrillator + Valvular Problems/Murmurs (s/p MV repair in 2000)  Rhythm:regular Rate:Normal     Neuro/Psych PSYCHIATRIC DISORDERS Anxiety Depression CVA    GI/Hepatic negative GI ROS, Neg liver ROS,   Endo/Other  Hypothyroidism   Renal/GU negative Renal ROS  negative genitourinary   Musculoskeletal   Abdominal   Peds  Hematology  (+) Blood dyscrasia, anemia , Hgb 11.9   Anesthesia Other Findings Past Medical History: No date: Allergic rhinitis No date: 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 s/p PVI [name] 2010;  b. on tikosyn/coumadin;  c.               05/2009 Echo: EF 60-65%, Gr 2 DD. (first dx 09/ 2007) No date: Bilateral lower extremity edema No date: Bleeding hemorrhoid No date: Carotid stenosis     Comment:  mild (hosp 3/11)- consult by vasc/ Dr Donnetta Hutching No date: Complication of anesthesia     Comment:  hard to wake No date: Diverticulosis of colon No date: Dyspnea     Comment:  on exertion-climbing stairs No date: Fatty liver No date: H/O cardiac radiofrequency ablation     Comment:  01/ 2008 at [facility] /  03/ 2010  at [facility] No date: Heart failure with preserved ejection fraction ([place]) No  date: History of adenomatous polyp of colon     Comment:  tubular adenoma's No date: History of cardiomyopathy     Comment:  secondary tachycardia-induced cardiomyopathy -- resolved              2014 No date: History of squamous cell carcinoma in situ (SCCIS) of skin     Comment:  05/ 2017  nasal bridge and right medial knee No date: History of transient ischemic attack (TIA)     Comment:  01-24-2005 and 06-12-2009 No date: Hyperlipidemia No date: Hypothyroidism No date: Mild intermittent asthma     Comment:  reacts to cats No date: Mixed stress and urge urinary incontinence No date: Pulmonary nodule 07/29/2019: S/P AV nodal ablation 07/28/19 10-23-1998  dr Boyce Medici at [facility]: S/P mitral valve repair     Comment:  for MVP and regurg. (annuloplasty ring procedure) 07/29/2019: S/P placement of cardiac pacemaker MDT 07/28/19  Past Surgical History: 1978: APPENDECTOMY 07/28/2019: AV NODE ABLATION; N/A     Comment:  Procedure: AV NODE ABLATION;  Surgeon: Deboraha Sprang,               MD;  Location: [facility];  Service:               Cardiovascular;  Laterality: N/A; 06/19/2019: BUBBLE STUDY     Comment:  Procedure: BUBBLE STUDY;  Surgeon: Pixie Casino, MD;  Location: [facility];  Service: Cardiovascular;; 01/ 2008    at [place] [facility]     Comment:  right-sided ablation atrial flutter 03/ 2010   dr Jaymes Graff at [facility]: [place]:  AV node ablation and pulmonary vein isolation for atrial              fib 06-18-2006;  07-13-2006;  10-19-2010;  10-27-2010: CARDIOVERSION No date: COLONOSCOPY 10/13/2017: COLONOSCOPY WITH PROPOFOL; N/A     Comment:  Procedure: COLONOSCOPY WITH PROPOFOL;  Surgeon: [name], MD;  Location: [facility] ENDOSCOPY;  Service:               Gastroenterology;  Laterality: N/A; 07-26-2007   dr Matilde Sprang: CYSTO/ TRANSURETHRAL  COLLAGEN INJECTION  THERAPY No date: DILATION AND CURETTAGE OF UTERUS 10/13/2017: ESOPHAGOGASTRODUODENOSCOPY (EGD) WITH PROPOFOL; N/A     Comment:  Procedure: ESOPHAGOGASTRODUODENOSCOPY (EGD) WITH               PROPOFOL;  Surgeon: [name], MD;  Location: [facility]               ENDOSCOPY;  Service: Gastroenterology;  Laterality: N/A; 1980s: EXCISIONAL HEMORRHOIDECTOMY 12/08/2017: GIVENS CAPSULE STUDY; N/A     Comment:  Procedure: GIVENS CAPSULE STUDY;  Surgeon: [name],               MD;  Location: [facility] ENDOSCOPY;  Service:               Gastroenterology;  Laterality: N/A; 10/29/2016: HEMORRHOID SURGERY; N/A     Comment:  Procedure: HEMORRHOIDECTOMY;  Surgeon: Leighton Ruff,               MD;  Location: [facility];  Service:               General;  Laterality: N/A; 10/23/1998: MITRAL VALVE ANNULOPLASTY     Comment:  "Model [id number]; Seriel [id number]"; size 11mm; [facility]; Dr. Boyce Medici 07/28/2019: PACEMAKER IMPLANT; N/A     Comment:  Procedure: PACEMAKER IMPLANT;  Surgeon: Deboraha Sprang,              MD;  Location: [facility];  Service:               Cardiovascular;  Laterality: N/A; 1954: PILONIDAL CYST EXCISION 05-06-2006 at [facility];  01-02-2013 at [facility]: TEE WITH CARDIOVERSION 06/19/2019: TEE WITHOUT CARDIOVERSION; N/A     Comment:  Procedure: TRANSESOPHAGEAL ECHOCARDIOGRAM (TEE);                Surgeon: Pixie Casino, MD;  Location: [facility] ENDOSCOPY;                Service: Cardiovascular;  Laterality: N/A; 05/04/2017: TOTAL HIP ARTHROPLASTY; Left     Comment:  Procedure: LEFT TOTAL HIP ARTHROPLASTY ANTERIOR               APPROACH;  Surgeon: Mcarthur Rossetti, MD;                Location: [facility];  Service: Orthopedics;  Laterality:               Left; 05-01-2015   dr Caryl Comes: TRANSTHORACIC ECHOCARDIOGRAM     Comment:  ef 50-55%/  mild AV sclerosis  without stenosis/  post MV              repair with mild central MR (valve area by pressure                half-time 2cm^2,  valve area by continutity equation               0.91cm^2, peak grandiant 37mmHg)/  severe LAE/ mild TR/               mild RAE  1978: TUBAL LIGATION; Bilateral  BMI    Body Mass Index: 27.99 kg/m      Reproductive/Obstetrics negative OB ROS                            Anesthesia Physical Anesthesia Plan  ASA: III  Anesthesia Plan: General   Post-op Pain Management:    Induction:   PONV Risk Score and Plan: Propofol infusion and TIVA  Airway Management Planned: Nasal Cannula  Additional Equipment:   Intra-op Plan:   Post-operative Plan:   Informed Consent: I have reviewed the patients History and Physical, chart, labs and discussed the procedure including the risks, benefits and alternatives for the proposed anesthesia with the patient or authorized representative who has indicated his/her understanding and acceptance.     Dental Advisory Given  Plan Discussed with: Anesthesiologist, CRNA and Surgeon  Anesthesia Plan Comments:        Anesthesia Quick Evaluation

## 2020-02-20 NOTE — Op Note (Signed)
[facility] Gastroenterology Patient Name: [name] Procedure Date: 02/20/2020 1:11 PM MRN: [id number] Account #: [id number] Date of Birth: 07/31/1932 Admit Type: Inpatient Age: 86 Room: [facility] ENDO ROOM 1 Gender: Female Note Status: Finalized Procedure:             Colonoscopy Indications:           Hematochezia Providers:             Andrey Farmer MD, MD Referring MD:          Wynelle Fanny. Tower (Referring MD) Medicines:             Monitored Anesthesia Care Complications:         No immediate complications. Estimated blood loss:                         Minimal. Procedure:             Pre-Anesthesia Assessment:                        - Prior to the procedure, a History and Physical was                         performed, and patient medications and allergies were                         reviewed. The patient is competent. The risks and                         benefits of the procedure and the sedation options and                         risks were discussed with the patient. All questions                         were answered and informed consent was obtained.                         Patient identification and proposed procedure were                         verified by the physician, the nurse, the anesthetist                         and the technician in the endoscopy suite. Mental                         Status Examination: alert and oriented. Airway                         Examination: normal oropharyngeal airway and neck                         mobility. Respiratory Examination: clear to                         auscultation. CV Examination: normal. Prophylactic                         Antibiotics: The patient does  not require prophylactic                         antibiotics. Prior Anticoagulants: The patient has                         taken Eliquis (apixaban), last dose was 2 days prior                         to procedure. ASA Grade Assessment: III - A  patient                         with severe systemic disease. After reviewing the                         risks and benefits, the patient was deemed in                         satisfactory condition to undergo the procedure. The                         anesthesia plan was to use monitored anesthesia care                         (MAC). Immediately prior to administration of                         medications, the patient was re-assessed for adequacy                         to receive sedatives. The heart rate, respiratory                         rate, oxygen saturations, blood pressure, adequacy of                         pulmonary ventilation, and response to care were                         monitored throughout the procedure. The physical                         status of the patient was re-assessed after the                         procedure.                        After obtaining informed consent, the colonoscope was                         passed under direct vision. Throughout the procedure,                         the patient's blood pressure, pulse, and oxygen                         saturations were monitored continuously. The  Colonoscope was introduced through the anus and                         advanced to the the cecum, identified by appendiceal                         orifice and ileocecal valve. The colonoscopy was                         performed without difficulty. The patient tolerated                         the procedure well. The quality of the bowel                         preparation was good. Findings:      The perianal and digital rectal examinations were normal.      A 2 mm polyp was found in the ascending colon. The polyp was sessile.       The polyp was removed with a jumbo cold forceps. Resection and retrieval       were complete. Estimated blood loss was minimal.      A 2 mm polyp was found in the hepatic flexure. The polyp was  sessile.       The polyp was removed with a jumbo cold forceps. Resection and retrieval       were complete. Estimated blood loss was minimal.      A 3 mm polyp was found in the transverse colon. The polyp was sessile.       The polyp was removed with a cold snare. Resection and retrieval were       complete. Estimated blood loss was minimal.      A scattered area of mild melanosis was found in the entire colon.      A few small-mouthed diverticula were found in the sigmoid colon and       descending colon.      Non-bleeding internal hemorrhoids were found during retroflexion. The       hemorrhoids were Grade I (internal hemorrhoids that do not prolapse).      The exam was otherwise without abnormality on direct and retroflexion       views. Impression:            - One 2 mm polyp in the ascending colon, removed with                         a jumbo cold forceps. Resected and retrieved.                        - One 2 mm polyp at the hepatic flexure, removed with                         a jumbo cold forceps. Resected and retrieved.                        - One 3 mm polyp in the transverse colon, removed with                         a cold snare. Resected and retrieved.                        -  Non-bleeding internal hemorrhoids.                        - The examination was otherwise normal on direct and                         retroflexion views. Recommendation:        - Return patient to hospital ward for ongoing care.                        - Advance diet as tolerated.                        - Resume Eliquis (apixaban) at prior dose in 1 day.                        - Await pathology results.                        - Repeat colonoscopy in 3 years for surveillance. Procedure Code(s):     --- Professional ---                        [phone], Colonoscopy, flexible; with removal of                         tumor(s), polyp(s), or other lesion(s) by snare                         technique                         45380, 100, Colonoscopy, flexible; with biopsy, single                         or multiple Diagnosis Code(s):     --- Professional ---                        K63.5, Polyp of colon                        K64.0, First degree hemorrhoids                        K92.1, Melena (includes Hematochezia) CPT copyright 2019 American Medical Association. All rights reserved. The codes documented in this report are preliminary and upon coder review may  be revised to meet current compliance requirements. Andrey Farmer, MD Andrey Farmer MD, MD 02/20/2020 1:50:01 PM Number of Addenda: 0 Note Initiated On: 02/20/2020 1:11 PM Scope Withdrawal Time: 0 hours 13 minutes 26 seconds  Total Procedure Duration: 0 hours 22 minutes 14 seconds  Estimated Blood Loss:  Estimated blood loss was minimal.      [facility]

## 2020-02-20 NOTE — Progress Notes (Signed)
PROGRESS NOTE    [name]  FWY:[id number] DOB: [birth year] DOA: 02/18/2020 PCP: Abner Greenspan, MD    Brief Narrative:  86 y.o. female with medical history significant for A. fib on Eliquis status post AV nodal ablation and pacemaker placement May 2021, history of GI bleed, with multiple polyps on colonoscopy 2774, diastolic heart failure and hypothyroidism as well as history of TIA, who presents to the emergency room with a 2-day history of episodes of bright red blood per rectum, initially small amounts mixed in with stool, but has become heavier and more frequent. She denies dizziness or lightheadedness, palpitations or shortness of breath and denies chest pain or abdominal pain. Pt was admitted for further workup  Assessment & Plan:   Principal Problem:   Hematochezia Active Problems:   Hypothyroidism   Atrial fibrillation, chronic (HCC)   COLONIC POLYPS, ADENOMATOUS, HX OF   (HFpEF) heart failure with preserved ejection fraction ([place])   Long term (current) use of anticoagulants   S/P AV nodal ablation 07/28/19   S/P placement of cardiac pacemaker MDT 07/28/19   History of TIA (transient ischemic attack)    Hematochezia:   Colonic polyps, adenomatous, history of, colonoscopy 2019   Long term (current) use of anticoagulants -Patient on systemic anticoagulation with Eliquis with history of GI bleeding: Polyps presenting with 2-day history of several episodes of hematochezia, hemodynamically stable with hemoglobin 13 at time of presentation -GI consulted and is following. Pt is now s/p colonoscopy on 11/30 with findings of mild diverticulosis, polyps, and hemorrhoids. Bleeding was thought to be secondary to hemorrhoids exacerbated by eliquis -Per GI, OK to restart eliquis tomorrow -Will repeat CBC in AM    Hypothyroidism -Have continued home thyroid replacement    Atrial fibrillation, chronic, on Eliquis   S/P AV nodal ablation 07/28/19 -Holding Eliquis due to GI  bleed -Currently rate controlled -Plan to resume eliquis on 12/1 if stable, per GI recs    S/P placement of cardiac pacemaker MDT 07/28/19 -seems to be stable at this time    History of TIA (transient ischemic attack) -Cont to hold antiplatelet, will likely resume tomorrow. Cont home zetia   (HFpEF) heart failure with preserved ejection fraction (HCC) -Currently euvolemic at this time  DVT prophylaxis: SCD's Code Status: Full Family Communication: Pt in room, family not at bedside  Status is: Inpatient  Remains inpatient appropriate because:Ongoing diagnostic testing needed not appropriate for outpatient work up and Inpatient level of care appropriate due to severity of illness   Dispo: The patient is from: Home              Anticipated d/c is to: Home              Anticipated d/c date is: 1 day              Patient currently is not medically stable to d/c.   Consultants:   GI  Procedures:     Antimicrobials: Anti-infectives (From admission, onward)   None      Subjective: Denies abd pain. Reports no further bleeding since stopping eliquis  Objective: Vitals:   02/20/20 1359 02/20/20 1409 02/20/20 1419 02/20/20 1446  BP: (!) 122/59 126/64 (!) 142/73 (!) 145/74  Pulse: 69 70 68 70  Resp: 15 17 12 18   Temp:    (!) 97.4 F (36.3 C)  TempSrc:    Oral  SpO2: 97% 97% 98% 98%  Weight:      Height:  Intake/Output Summary (Last 24 hours) at 02/20/2020 1526 Last data filed at 02/20/2020 0500 Gross per 24 hour  Intake 2577.55 ml  Output --  Net 2577.55 ml   Filed Weights   02/19/20 0440 02/20/20 0459 02/20/20 1243  Weight: 76.4 kg 76.3 kg 76.3 kg    Examination: General exam: Awake, laying in bed, in nad Respiratory system: Normal respiratory effort, no wheezing Cardiovascular system: regular rate, s1, s2 Gastrointestinal system: Soft, nondistended, positive BS Central nervous system: CN2-12 grossly intact, strength intact Extremities:  Perfused, no clubbing Skin: Normal skin turgor, no notable skin lesions seen Psychiatry: Mood normal // no visual hallucinations   Data Reviewed: I have personally reviewed following labs and imaging studies  CBC: Recent Labs  Lab 02/18/20 0210 02/18/20 0810 02/18/20 1400 02/19/20 0649 02/20/20 0508  WBC 6.3 4.4 4.2 4.0 4.5  NEUTROABS 3.8  --   --   --   --   HGB 13.2 11.7* 12.1 11.9* 11.9*  HCT 41.3 36.7 38.5 37.8 36.9  MCV 87.5 88.0 87.7 88.7 87.4  PLT 160 136* 147* 134* 945*   Basic Metabolic Panel: Recent Labs  Lab 02/18/20 0210  NA 143  K 3.9  CL 106  CO2 26  GLUCOSE 110*  BUN 36*  CREATININE 0.80  CALCIUM 9.3   GFR: Estimated Creatinine Clearance: 52.5 mL/min (by C-G formula based on SCr of 0.8 mg/dL). Liver Function Tests: Recent Labs  Lab 02/18/20 0210  AST 35  ALT 24  ALKPHOS 43  BILITOT 0.7  PROT 7.5  ALBUMIN 4.0   No results for input(s): LIPASE, AMYLASE in the last 168 hours. No results for input(s): AMMONIA in the last 168 hours. Coagulation Profile: Recent Labs  Lab 02/18/20 0210 02/20/20 0508  INR 1.5* 1.2   Cardiac Enzymes: No results for input(s): CKTOTAL, CKMB, CKMBINDEX, TROPONINI in the last 168 hours. BNP (last 3 results) No results for input(s): PROBNP in the last 8760 hours. HbA1C: No results for input(s): HGBA1C in the last 72 hours. CBG: No results for input(s): GLUCAP in the last 168 hours. Lipid Profile: No results for input(s): CHOL, HDL, LDLCALC, TRIG, CHOLHDL, LDLDIRECT in the last 72 hours. Thyroid Function Tests: No results for input(s): TSH, T4TOTAL, FREET4, T3FREE, THYROIDAB in the last 72 hours. Anemia Panel: No results for input(s): VITAMINB12, FOLATE, FERRITIN, TIBC, IRON, RETICCTPCT in the last 72 hours. Sepsis Labs: Recent Labs  Lab 02/18/20 0210  LATICACIDVEN 1.1    Recent Results (from the past 240 hour(s))  Resp Panel by RT-PCR (Flu A&B, Covid) Nasopharyngeal Swab     Status: None   Collection  Time: 02/18/20  1:58 AM   Specimen: Nasopharyngeal Swab; Nasopharyngeal(NP) swabs in vial transport medium  Result Value Ref Range Status   SARS Coronavirus 2 by RT PCR NEGATIVE NEGATIVE Final    Comment: (NOTE) SARS-CoV-2 target nucleic acids are NOT DETECTED.  The SARS-CoV-2 RNA is generally detectable in upper respiratory specimens during the acute phase of infection. The lowest concentration of SARS-CoV-2 viral copies this assay can detect is 138 copies/mL. A negative result does not preclude SARS-Cov-2 infection and should not be used as the sole basis for treatment or other patient management decisions. A negative result may occur with  improper specimen collection/handling, submission of specimen other than nasopharyngeal swab, presence of viral mutation(s) within the areas targeted by this assay, and inadequate number of viral copies(<138 copies/mL). A negative result must be combined with clinical observations, patient history, and epidemiological information. The expected  result is Negative.  Fact Sheet for Patients:  EntrepreneurPulse.com.au  Fact Sheet for Healthcare Providers:  IncredibleEmployment.be  This test is no t yet approved or cleared by the [place] FDA and  has been authorized for detection and/or diagnosis of SARS-CoV-2 by FDA under an Emergency Use Authorization (EUA). This EUA will remain  in effect (meaning this test can be used) for the duration of the COVID-19 declaration under Section 564(b)(1) of the Act, 21 U.S.C.section 360bbb-3(b)(1), unless the authorization is terminated  or revoked sooner.       Influenza A by PCR NEGATIVE NEGATIVE Final   Influenza B by PCR NEGATIVE NEGATIVE Final    Comment: (NOTE) The Xpert Xpress SARS-CoV-2/FLU/RSV plus assay is intended as an aid in the diagnosis of influenza from Nasopharyngeal swab specimens and should not be used as a sole basis for treatment. Nasal washings  and aspirates are unacceptable for Xpert Xpress SARS-CoV-2/FLU/RSV testing.  Fact Sheet for Patients: EntrepreneurPulse.com.au  Fact Sheet for Healthcare Providers: IncredibleEmployment.be  This test is not yet approved or cleared by the [place] FDA and has been authorized for detection and/or diagnosis of SARS-CoV-2 by FDA under an Emergency Use Authorization (EUA). This EUA will remain in effect (meaning this test can be used) for the duration of the COVID-19 declaration under Section 564(b)(1) of the Act, 21 U.S.C. section 360bbb-3(b)(1), unless the authorization is terminated or revoked.  Performed at [facility], [address] [place], [place] [address]      Radiology Studies: No results found.  Scheduled Meds: . ezetimibe  10 mg Oral Daily  . levothyroxine  25 mcg Oral Q0600   Continuous Infusions: . sodium chloride    . sodium chloride 100 mL/hr at 02/20/20 1314     LOS: 2 days   Marylu Lund, MD [facility] Pager On Amion  If 7PM-7AM, please contact night-coverage 02/20/2020, 3:26 PM

## 2020-02-20 NOTE — Plan of Care (Signed)
Patient A/O x4, bowel prepping for colonoscopy later in the day. NPO since midnight. IV fluids continued at 190ml/hr. No new changes overnight. Will continue with the current plan of care.  Problem: Education: Goal: Knowledge of General Education information will improve Description: Including pain rating scale, medication(s)/side effects and non-pharmacologic comfort measures Outcome: Progressing   Problem: Health Behavior/Discharge Planning: Goal: Ability to manage health-related needs will improve Outcome: Progressing   Problem: Clinical Measurements: Goal: Ability to maintain clinical measurements within normal limits will improve Outcome: Progressing Goal: Will remain free from infection Outcome: Progressing Goal: Diagnostic test results will improve Outcome: Progressing Goal: Respiratory complications will improve Outcome: Progressing Goal: Cardiovascular complication will be avoided Outcome: Progressing   Problem: Activity: Goal: Risk for activity intolerance will decrease Outcome: Progressing   Problem: Nutrition: Goal: Adequate nutrition will be maintained Outcome: Progressing   Problem: Coping: Goal: Level of anxiety will decrease Outcome: Progressing   Problem: Elimination: Goal: Will not experience complications related to bowel motility Outcome: Progressing Goal: Will not experience complications related to urinary retention Outcome: Progressing   Problem: Pain Managment: Goal: General experience of comfort will improve Outcome: Progressing   Problem: Safety: Goal: Ability to remain free from injury will improve Outcome: Progressing   Problem: Skin Integrity: Goal: Risk for impaired skin integrity will decrease Outcome: Progressing

## 2020-02-20 NOTE — Transfer of Care (Signed)
Immediate Anesthesia Transfer of Care Note  Patient: [name]  Procedure(s) Performed: COLONOSCOPY WITH PROPOFOL (N/A )  Patient Location: PACU  Anesthesia Type:MAC  Level of Consciousness: drowsy  Airway & Oxygen Therapy: Patient Spontanous Breathing  Post-op Assessment: Report given to RN and Post -op Vital signs reviewed and stable  Post vital signs: stable  Last Vitals:  Vitals Value Taken Time  BP    Temp 35.9 C 02/20/20 1349  Pulse 71 02/20/20 1349  Resp 12 02/20/20 1349  SpO2 95 % 02/20/20 1349    Last Pain:  Vitals:   02/20/20 1349  TempSrc: Temporal  PainSc:       Patients Stated Pain Goal: 0 (62/22/97 9892)  Complications: No complications documented.

## 2020-02-21 ENCOUNTER — Encounter: Payer: Self-pay | Admitting: Gastroenterology

## 2020-02-21 ENCOUNTER — Telehealth: Payer: Self-pay

## 2020-02-21 LAB — CBC
HCT: 36.8 % (ref 36.0–46.0)
Hemoglobin: 11.9 g/dL — ABNORMAL LOW (ref 12.0–15.0)
MCH: 28.1 pg (ref 26.0–34.0)
MCHC: 32.3 g/dL (ref 30.0–36.0)
MCV: 86.8 fL (ref 80.0–100.0)
Platelets: 122 10*3/uL — ABNORMAL LOW (ref 150–400)
RBC: 4.24 MIL/uL (ref 3.87–5.11)
RDW: 14.6 % (ref 11.5–15.5)
WBC: 4.5 10*3/uL (ref 4.0–10.5)
nRBC: 0 % (ref 0.0–0.2)

## 2020-02-21 NOTE — Discharge Summary (Signed)
Physician Discharge Summary  [name] YOV:[id number] DOB: 09-Jun-1932 DOA: 02/18/2020  PCP: Abner Greenspan, MD  Admit date: 02/18/2020 Discharge date: 02/21/2020  Admitted From: Home Disposition: Home  Recommendations for Outpatient Follow-up:  1. Follow up with PCP in 1-2 weeks 2. Please obtain BMP/CBC in one week 3. Please follow up on the following pending results: None  Home Health: No Equipment/Devices: None Discharge Condition: Stable CODE STATUS: Full Diet recommendation: Heart Healthy / Carb Modified   Brief/Interim Summary: 86 y.o.femalewith medical history significant forA. fib on Eliquis status post AV nodal ablation and pacemaker placement May 2021, history of GI bleed, with multiple polyps on colonoscopy 7412, diastolic heart failure and hypothyroidism as well as history of TIA, who presents to the emergency room with a 2-day history of episodes of bright red blood per rectum, initially small amounts mixed in with stool, but has becomeheavier and more frequent.She denies dizziness or lightheadedness, palpitations or shortness of breath and denies chest pain or abdominal pain.  GI was consulted and she underwent colonoscopy on 02/20/2020 with findings of mild diverticulosis, polyps and hemorrhoids.  Bleeding was thought to be secondary to hemorrhoids exacerbated by Eliquis.  Per GI she can restart Eliquis from tomorrow.  Hemoglobin remained stable.  Patient was concerned about Eliquis use, stating that she was on Coumadin and never had any problem.  She was asked to discuss with her cardiologist for further recommendations.  She will continue with rest of her medications and follow-up with her providers.  Discharge Diagnoses:  Principal Problem:   Hematochezia Active Problems:   Hypothyroidism   Atrial fibrillation, chronic (HCC)   COLONIC POLYPS, ADENOMATOUS, HX OF   (HFpEF) heart failure with preserved ejection fraction ([place])   Long term (current) use of  anticoagulants   S/P AV nodal ablation 07/28/19   S/P placement of cardiac pacemaker MDT 07/28/19   History of TIA (transient ischemic attack)   Discharge Instructions  Discharge Instructions    Diet - low sodium heart healthy   Complete by: As directed    Discharge instructions   Complete by: As directed    It was pleasure taking care of you. Continue taking your medications including Eliquis and follow-up with your cardiologist for further recommendations. You can discuss with him regarding the need of Eliquis or if it is better to switch back to Coumadin as all of them can cause bleeding. Follow-up with your gastroenterologist for recommendations to taking care of your hemorrhoids.   Increase activity slowly   Complete by: As directed      Allergies as of 02/21/2020      Reactions   Amiodarone Hcl Swelling   SWELLING REACTION UNSPECIFIED    Penicillins Rash   Has patient had a PCN reaction causing immediate rash, facial/tongue/throat swelling, SOB or lightheadedness with hypotension: No Has patient had a PCN reaction causing severe rash involving mucus membranes or skin necrosis: No Has patient had a PCN reaction that required hospitalization:Patient was inpatient when reaction occurred Has patient had a PCN reaction occurring within the last 10 years: No If all of the above answers are "NO", then may proceed with Cephalosporin use.   Statins Rash      Medication List    TAKE these medications   acetaminophen 325 MG tablet Commonly known as: TYLENOL Take 325 mg by mouth every 6 (six) hours as needed for moderate pain or headache.   albuterol 108 (90 Base) MCG/ACT inhaler Commonly known as: VENTOLIN HFA Inhale 2  puffs into the lungs every 4 (four) hours as needed for wheezing or shortness of breath.   diclofenac Sodium 1 % Gel Commonly known as: VOLTAREN Apply 1 application topically 4 (four) times daily as needed (pain).   diphenhydrAMINE 25 mg capsule Commonly known  as: BENADRYL Take 50 mg by mouth every 6 (six) hours as needed for itching.   diphenhydrAMINE-zinc acetate cream Commonly known as: BENADRYL Apply 1 application topically 3 (three) times daily as needed for itching.   Eliquis 5 MG Tabs tablet Generic drug: apixaban TAKE 1 TABLET TWICE DAILY   ezetimibe 10 MG tablet Commonly known as: ZETIA Take 1 tablet (10 mg total) by mouth daily.   ferrous sulfate 325 (65 FE) MG EC tablet TAKE 1 TABLET EVERY DAY WITH BREAKFAST   fluticasone 50 MCG/ACT nasal spray Commonly known as: FLONASE Place 1 spray into both nostrils daily as needed for allergies.   furosemide 40 MG tablet Commonly known as: LASIX Take 40 mg by mouth daily as needed for edema.   levothyroxine 25 MCG tablet Commonly known as: SYNTHROID TAKE 1 TABLET EVERY DAY BEFORE BREAKFAST   loratadine 10 MG tablet Commonly known as: CLARITIN Take 10 mg by mouth daily as needed for allergies.   Lubricant Eye Drops 0.4-0.3 % Soln Generic drug: Polyethyl Glycol-Propyl Glycol Place 1-2 drops into both eyes 3 (three) times daily as needed (for dry eyes.).       Follow-up Information    Tower, Wynelle Fanny, MD. Schedule an appointment as soon as possible for a visit.   Specialties: Family Medicine, Radiology Contact information: [place] [address] [phone]        Deboraha Sprang, MD .   Specialty: Cardiology Contact information: [phone] [place] [address] [phone]              Allergies  Allergen Reactions  . Amiodarone Hcl Swelling    SWELLING REACTION UNSPECIFIED   . Penicillins Rash    Has patient had a PCN reaction causing immediate rash, facial/tongue/throat swelling, SOB or lightheadedness with hypotension: No Has patient had a PCN reaction causing severe rash involving mucus membranes or skin necrosis: No Has patient had a PCN reaction that required hospitalization:Patient was inpatient when  reaction occurred Has patient had a PCN reaction occurring within the last 10 years: No If all of the above answers are "NO", then may proceed with Cephalosporin use.   . Statins Rash    Consultations:  GI  Procedures/Studies: CUP PACEART REMOTE DEVICE CHECK  Result Date: 01/26/2020 Scheduled remote reviewed. Normal device function.  Next remote 91 days.  CT Angio Abd/Pel W and/or Wo Contrast  Result Date: 02/18/2020 CLINICAL DATA:  GI bleed.  Rectal bleeding. EXAM: CTA ABDOMEN AND PELVIS WITHOUT AND WITH CONTRAST TECHNIQUE: Multidetector CT imaging of the abdomen and pelvis was performed using the standard protocol during bolus administration of intravenous contrast. Multiplanar reconstructed images and MIPs were obtained and reviewed to evaluate the vascular anatomy. CONTRAST:  131mL OMNIPAQUE IOHEXOL 350 MG/ML SOLN COMPARISON:  CT dated February 28, 2013 FINDINGS: VASCULAR Aorta: There are atherosclerotic changes of the abdominal aorta without evidence for an aneurysm Celiac: Patent without evidence of aneurysm, dissection, vasculitis or significant stenosis. SMA: Patent without evidence of aneurysm, dissection, vasculitis or significant stenosis. There is a replaced right hepatic artery, a normal variant. Renals: Both renal arteries are patent without evidence of aneurysm, dissection, vasculitis, fibromuscular dysplasia or significant stenosis. IMA:  Patent without evidence of aneurysm, dissection, vasculitis or significant stenosis. Inflow: Patent without evidence of aneurysm, dissection, vasculitis or significant stenosis. Proximal Outflow: Bilateral common femoral and visualized portions of the superficial and profunda femoral arteries are patent without evidence of aneurysm, dissection, vasculitis or significant stenosis. Veins: No obvious venous abnormality within the limitations of this arterial phase study. Review of the MIP images confirms the above findings. NON-VASCULAR Lower chest:  The lung bases are clear. The heart size is normal. Hepatobiliary: The liver is normal. Normal gallbladder.There is no biliary ductal dilation. Pancreas: Small calcifications are noted at the pancreatic head. There is no definite underlying pancreatic mass. Spleen: Unremarkable. Adrenals/Urinary Tract: --Adrenal glands: Unremarkable. --Right kidney/ureter: No hydronephrosis or radiopaque kidney stones. --Left kidney/ureter: No hydronephrosis or radiopaque kidney stones. --Urinary bladder: Unremarkable. Stomach/Bowel: --Stomach/Duodenum: There is a small hiatal hernia. --Small bowel: Unremarkable. --Colon: There is scattered colonic diverticula without CT evidence for diverticulitis. There is a large amount of stool in the colon. --Appendix: Not visualized. No right lower quadrant inflammation or free fluid. Lymphatic: --No retroperitoneal lymphadenopathy. --No mesenteric lymphadenopathy. --No pelvic or inguinal lymphadenopathy. Reproductive: Unremarkable Other: No ascites or free air. The abdominal wall is normal. Musculoskeletal. No acute displaced fractures. IMPRESSION: 1. No evidence for active GI bleeding. 2. Colonic diverticulosis without CT evidence for diverticulitis. 3. Small hiatal hernia. 4. Small calcifications at the pancreatic head may be secondary to chronic pancreatitis. 5. Large amount of stool in the colon. Aortic Atherosclerosis (ICD10-I70.0). Electronically Signed   By: Constance Holster M.D.   On: 02/18/2020 03:57     Subjective: Patient was feeling better when seen today.  Denies any more bleeding.  Had a long discussion regarding Eliquis versus Coumadin.  Patient was concerned that Eliquis is causing more hemorrhoidal bleed than Coumadin.  Asked her to discuss with her cardiologist.  Discharge Exam: Vitals:   02/21/20 0407 02/21/20 0818  BP: 137/66 (!) 160/75  Pulse: 69 72  Resp: 16 18  Temp: 97.8 F (36.6 C) 97.8 F (36.6 C)  SpO2: 97% 100%   Vitals:   02/20/20 1551  02/20/20 2014 02/21/20 0407 02/21/20 0818  BP: 139/68 140/69 137/66 (!) 160/75  Pulse: 70 70 69 72  Resp: 17 18 16 18   Temp: 98 F (36.7 C) 98.4 F (36.9 C) 97.8 F (36.6 C) 97.8 F (36.6 C)  TempSrc: Oral Oral Oral Oral  SpO2: 97% 97% 97% 100%  Weight:   75.8 kg   Height:        General: Pt is alert, awake, not in acute distress Cardiovascular: RRR, S1/S2 +, no rubs, no gallops Respiratory: CTA bilaterally, no wheezing, no rhonchi Abdominal: Soft, NT, ND, bowel sounds + Extremities: no edema, no cyanosis   The results of significant diagnostics from this hospitalization (including imaging, microbiology, ancillary and laboratory) are listed below for reference.    Microbiology: Recent Results (from the past 240 hour(s))  Resp Panel by RT-PCR (Flu A&B, Covid) Nasopharyngeal Swab     Status: None   Collection Time: 02/18/20  1:58 AM   Specimen: Nasopharyngeal Swab; Nasopharyngeal(NP) swabs in vial transport medium  Result Value Ref Range Status   SARS Coronavirus 2 by RT PCR NEGATIVE NEGATIVE Final    Comment: (NOTE) SARS-CoV-2 target nucleic acids are NOT DETECTED.  The SARS-CoV-2 RNA is generally detectable in upper respiratory specimens during the acute phase of infection. The lowest concentration of SARS-CoV-2 viral copies this assay can detect is 138 copies/mL. A negative result does not preclude  SARS-Cov-2 infection and should not be used as the sole basis for treatment or other patient management decisions. A negative result may occur with  improper specimen collection/handling, submission of specimen other than nasopharyngeal swab, presence of viral mutation(s) within the areas targeted by this assay, and inadequate number of viral copies(<138 copies/mL). A negative result must be combined with clinical observations, patient history, and epidemiological information. The expected result is Negative.  Fact Sheet for Patients:   EntrepreneurPulse.com.au  Fact Sheet for Healthcare Providers:  IncredibleEmployment.be  This test is no t yet approved or cleared by the [place] FDA and  has been authorized for detection and/or diagnosis of SARS-CoV-2 by FDA under an Emergency Use Authorization (EUA). This EUA will remain  in effect (meaning this test can be used) for the duration of the COVID-19 declaration under Section 564(b)(1) of the Act, 21 U.S.C.section 360bbb-3(b)(1), unless the authorization is terminated  or revoked sooner.       Influenza A by PCR NEGATIVE NEGATIVE Final   Influenza B by PCR NEGATIVE NEGATIVE Final    Comment: (NOTE) The Xpert Xpress SARS-CoV-2/FLU/RSV plus assay is intended as an aid in the diagnosis of influenza from Nasopharyngeal swab specimens and should not be used as a sole basis for treatment. Nasal washings and aspirates are unacceptable for Xpert Xpress SARS-CoV-2/FLU/RSV testing.  Fact Sheet for Patients: EntrepreneurPulse.com.au  Fact Sheet for Healthcare Providers: IncredibleEmployment.be  This test is not yet approved or cleared by the [place] FDA and has been authorized for detection and/or diagnosis of SARS-CoV-2 by FDA under an Emergency Use Authorization (EUA). This EUA will remain in effect (meaning this test can be used) for the duration of the COVID-19 declaration under Section 564(b)(1) of the Act, 21 U.S.C. section 360bbb-3(b)(1), unless the authorization is terminated or revoked.  Performed at [facility], [address] [place], [place] [address]      Labs: BNP (last 3 results) No results for input(s): BNP in the last 8760 hours. Basic Metabolic Panel: Recent Labs  Lab 02/18/20 0210  NA 143  K 3.9  CL 106  CO2 26  GLUCOSE 110*  BUN 36*  CREATININE 0.80  CALCIUM 9.3   Liver Function Tests: Recent Labs  Lab 02/18/20 0210  AST 35  ALT 24   ALKPHOS 43  BILITOT 0.7  PROT 7.5  ALBUMIN 4.0   No results for input(s): LIPASE, AMYLASE in the last 168 hours. No results for input(s): AMMONIA in the last 168 hours. CBC: Recent Labs  Lab 02/18/20 0210 02/18/20 0210 02/18/20 0810 02/18/20 1400 02/19/20 0649 02/20/20 0508 02/21/20 0509  WBC 6.3   < > 4.4 4.2 4.0 4.5 4.5  NEUTROABS 3.8  --   --   --   --   --   --   HGB 13.2   < > 11.7* 12.1 11.9* 11.9* 11.9*  HCT 41.3   < > 36.7 38.5 37.8 36.9 36.8  MCV 87.5   < > 88.0 87.7 88.7 87.4 86.8  PLT 160   < > 136* 147* 134* 137* 122*   < > = values in this interval not displayed.   Cardiac Enzymes: No results for input(s): CKTOTAL, CKMB, CKMBINDEX, TROPONINI in the last 168 hours. BNP: Invalid input(s): POCBNP CBG: No results for input(s): GLUCAP in the last 168 hours. D-Dimer No results for input(s): DDIMER in the last 72 hours. Hgb A1c No results for input(s): HGBA1C in the last 72 hours. Lipid Profile No results for  input(s): CHOL, HDL, LDLCALC, TRIG, CHOLHDL, LDLDIRECT in the last 72 hours. Thyroid function studies No results for input(s): TSH, T4TOTAL, T3FREE, THYROIDAB in the last 72 hours.  Invalid input(s): FREET3 Anemia work up No results for input(s): VITAMINB12, FOLATE, FERRITIN, TIBC, IRON, RETICCTPCT in the last 72 hours. Urinalysis    Component Value Date/Time   COLORURINE STRAW (A) 06/16/2019 1245   APPEARANCEUR CLEAR 06/16/2019 1245   APPEARANCEUR Clear 02/28/2013 1204   LABSPEC 1.039 (H) 06/16/2019 1245   LABSPEC 1.006 02/28/2013 1204   PHURINE 5.0 06/16/2019 1245   GLUCOSEU NEGATIVE 06/16/2019 1245   GLUCOSEU Negative 02/28/2013 1204   HGBUR MODERATE (A) 06/16/2019 1245   BILIRUBINUR NEGATIVE 06/16/2019 1245   BILIRUBINUR Negative 02/04/2018 1703   BILIRUBINUR Negative 02/28/2013 1204   KETONESUR NEGATIVE 06/16/2019 1245   PROTEINUR NEGATIVE 06/16/2019 1245   UROBILINOGEN 0.2 02/04/2018 1703   UROBILINOGEN 0.2 02/08/2012 0324   NITRITE  NEGATIVE 06/16/2019 1245   LEUKOCYTESUR NEGATIVE 06/16/2019 1245   LEUKOCYTESUR 3+ 02/28/2013 1204   Sepsis Labs Invalid input(s): PROCALCITONIN,  WBC,  LACTICIDVEN Microbiology Recent Results (from the past 240 hour(s))  Resp Panel by RT-PCR (Flu A&B, Covid) Nasopharyngeal Swab     Status: None   Collection Time: 02/18/20  1:58 AM   Specimen: Nasopharyngeal Swab; Nasopharyngeal(NP) swabs in vial transport medium  Result Value Ref Range Status   SARS Coronavirus 2 by RT PCR NEGATIVE NEGATIVE Final    Comment: (NOTE) SARS-CoV-2 target nucleic acids are NOT DETECTED.  The SARS-CoV-2 RNA is generally detectable in upper respiratory specimens during the acute phase of infection. The lowest concentration of SARS-CoV-2 viral copies this assay can detect is 138 copies/mL. A negative result does not preclude SARS-Cov-2 infection and should not be used as the sole basis for treatment or other patient management decisions. A negative result may occur with  improper specimen collection/handling, submission of specimen other than nasopharyngeal swab, presence of viral mutation(s) within the areas targeted by this assay, and inadequate number of viral copies(<138 copies/mL). A negative result must be combined with clinical observations, patient history, and epidemiological information. The expected result is Negative.  Fact Sheet for Patients:  EntrepreneurPulse.com.au  Fact Sheet for Healthcare Providers:  IncredibleEmployment.be  This test is no t yet approved or cleared by the [place] FDA and  has been authorized for detection and/or diagnosis of SARS-CoV-2 by FDA under an Emergency Use Authorization (EUA). This EUA will remain  in effect (meaning this test can be used) for the duration of the COVID-19 declaration under Section 564(b)(1) of the Act, 21 U.S.C.section 360bbb-3(b)(1), unless the authorization is terminated  or revoked sooner.        Influenza A by PCR NEGATIVE NEGATIVE Final   Influenza B by PCR NEGATIVE NEGATIVE Final    Comment: (NOTE) The Xpert Xpress SARS-CoV-2/FLU/RSV plus assay is intended as an aid in the diagnosis of influenza from Nasopharyngeal swab specimens and should not be used as a sole basis for treatment. Nasal washings and aspirates are unacceptable for Xpert Xpress SARS-CoV-2/FLU/RSV testing.  Fact Sheet for Patients: EntrepreneurPulse.com.au  Fact Sheet for Healthcare Providers: IncredibleEmployment.be  This test is not yet approved or cleared by the [place] FDA and has been authorized for detection and/or diagnosis of SARS-CoV-2 by FDA under an Emergency Use Authorization (EUA). This EUA will remain in effect (meaning this test can be used) for the duration of the COVID-19 declaration under Section 564(b)(1) of the Act, 21 U.S.C. section 360bbb-3(b)(1), unless the  authorization is terminated or revoked.  Performed at [facility], [address] [place], [place] [address]     Time coordinating discharge: Over 30 minutes  SIGNED:  Lorella Nimrod, MD  [facility] 02/21/2020, 10:32 AM  If 7PM-7AM, please contact night-coverage [email or web address]  This record has been created using Systems analyst. Errors have been sought and corrected,but may not always be located. Such creation errors do not reflect on the standard of care.

## 2020-02-21 NOTE — Care Management Important Message (Signed)
Important Message  Patient Details  Name: [name] MRN: [id number] Date of Birth: Oct 04, 1932   Medicare Important Message Given:  Yes     Dannette Barbara 02/21/2020, 11:06 AM

## 2020-02-21 NOTE — Telephone Encounter (Signed)
Transition Care Management Follow-up Telephone Call  Date of discharge and from where: 02/21/2020, [facility]  How have you been since you were released from the hospital? Patient states that she is feeling better now.  Any questions or concerns? No  Items Reviewed:  Did the pt receive and understand the discharge instructions provided? Yes   Medications obtained and verified? Yes   Other? No   Any new allergies since your discharge? No   Dietary orders reviewed? Yes  Do you have support at home? Yes   Home Care and Equipment/Supplies: Were home health services ordered? not applicable If so, what is the name of the agency? N/A  Has the agency set up a time to come to the patient's home? not applicable Were any new equipment or medical supplies ordered?  No What is the name of the medical supply agency? N/A Were you able to get the supplies/equipment? not applicable Do you have any questions related to the use of the equipment or supplies? No  Functional Questionnaire: (I = Independent and D = Dependent) ADLs: I  Bathing/Dressing- I  Meal Prep- I  Eating- I  Maintaining continence- I  Transferring/Ambulation- I  Managing Meds- I  Follow up appointments reviewed:   PCP Hospital f/u appt confirmed? Yes  Scheduled to see Dr. Glori Bickers on 02/23/2020 @ 10 am .  [facility] f/u appt confirmed? Yes  Scheduled to see Dr. Caryl Comes on 02/28/2020 @ 8:15 am.  Are transportation arrangements needed? No   If their condition worsens, is the pt aware to call PCP or go to the Emergency Dept.? Yes  Was the patient provided with contact information for the PCP's office or ED? Yes  Was to pt encouraged to call back with questions or concerns? Yes

## 2020-02-21 NOTE — Plan of Care (Signed)
Pt ready for discharge IV and tele removed. Discharge instructions reviewed with patient  Time allowed for questions and concerns Verbalizes an understanding.  Denies any additional wants or needs Waiting for patient transportation home    Problem: Education: Goal: Knowledge of General Education information will improve Description: Including pain rating scale, medication(s)/side effects and non-pharmacologic comfort measures Outcome: Adequate for Discharge   Problem: Health Behavior/Discharge Planning: Goal: Ability to manage health-related needs will improve Outcome: Adequate for Discharge   Problem: Clinical Measurements: Goal: Ability to maintain clinical measurements within normal limits will improve Outcome: Adequate for Discharge Goal: Will remain free from infection Outcome: Adequate for Discharge Goal: Diagnostic test results will improve Outcome: Adequate for Discharge Goal: Respiratory complications will improve Outcome: Adequate for Discharge Goal: Cardiovascular complication will be avoided Outcome: Adequate for Discharge   Problem: Activity: Goal: Risk for activity intolerance will decrease Outcome: Adequate for Discharge   Problem: Nutrition: Goal: Adequate nutrition will be maintained Outcome: Adequate for Discharge   Problem: Coping: Goal: Level of anxiety will decrease Outcome: Adequate for Discharge   Problem: Elimination: Goal: Will not experience complications related to bowel motility Outcome: Adequate for Discharge Goal: Will not experience complications related to urinary retention Outcome: Adequate for Discharge   Problem: Pain Managment: Goal: General experience of comfort will improve Outcome: Adequate for Discharge   Problem: Safety: Goal: Ability to remain free from injury will improve Outcome: Adequate for Discharge   Problem: Skin Integrity: Goal: Risk for impaired skin integrity will decrease Outcome: Adequate for Discharge

## 2020-02-21 NOTE — Plan of Care (Signed)

## 2020-02-22 LAB — SURGICAL PATHOLOGY

## 2020-02-22 NOTE — Anesthesia Postprocedure Evaluation (Signed)
Anesthesia Post Note  Patient: [name]  Procedure(s) Performed: COLONOSCOPY WITH PROPOFOL (N/A )  Patient location during evaluation: PACU Anesthesia Type: General Level of consciousness: awake and alert Pain management: pain level controlled Vital Signs Assessment: post-procedure vital signs reviewed and stable Respiratory status: spontaneous breathing, nonlabored ventilation and respiratory function stable Cardiovascular status: blood pressure returned to baseline and stable Postop Assessment: no apparent nausea or vomiting Anesthetic complications: no   No complications documented.   Last Vitals:  Vitals:   02/21/20 0407 02/21/20 0818  BP: 137/66 (!) 160/75  Pulse: 69 72  Resp: 16 18  Temp: 36.6 C 36.6 C  SpO2: 97% 100%    Last Pain:  Vitals:   02/21/20 0818  TempSrc: Oral  PainSc: 0-No pain                 Brett Canales Merrillyn Ackerley

## 2020-02-23 ENCOUNTER — Inpatient Hospital Stay: Payer: Medicare Other | Admitting: Family Medicine

## 2020-02-23 ENCOUNTER — Telehealth: Payer: Self-pay

## 2020-02-23 NOTE — Telephone Encounter (Signed)
That is possible.  Please watch for redness or swelling of leg (s) which could represent infection or a blood clot. Warm compress to the area and stretching/slow walking If symptoms become severe-go to ER or UC  If no improvement Monday let me know

## 2020-02-23 NOTE — Telephone Encounter (Signed)
Pt notified of Dr. Tower's instructions and verbalized understanding  

## 2020-02-23 NOTE — Telephone Encounter (Signed)
Pt was in the hospital this past week. Had a colonoscopy in the hospital on Tuesday (12/30). Came home from the hospital yesterday. Went to bed. This morning when she got up her right leg was weak and painful to walk on. Someone suggested it could have been from the colonoscopy and how they had her legs positioned. She tried to call the GI office but never got anyone on the line. Please advise.

## 2020-02-26 NOTE — Progress Notes (Deleted)
Cardiology Office Note Date:  02/26/2020  Patient ID:  [name], [name] 12/10/1932, MRN [id number] PCP:  Abner Greenspan, MD  Cardiologist:  Dr. Caryl Comes   Chief Complaint: *** post hospital visit   History of Present Illness: [name] is a 86 y.o. female with history of permanent AFib, hypothyroidism, asthma, HLD, TIA, CVA, AFlutter w/ ablation related to prior atriotomy undertaken at [facility] in March 2010, VHD s/p MV repair (2020), remote NICM with recovered LVEF, permanent Afib, >> Avnode ablation and PPM  I saw her June 2021 She comes in today to be seen for Dr. Caryl Comes, last seen by him 07/06/19, at that visit noted significant reduction in exertional capacity post stroke more DOE, and more despondent in general, seems she mentioned life not worth living feeling so poorly. She was planned for AVnode ablation, PPM hoping the reduction in meds/fatigue associated with them and better HR control would improve quality of life.. She feels markedly improved from pre-pacer/AV node ablation.  She has had a clear improvement in her exertional capacity.  Not quite whee she would like to be, but much better.  No CP, palpitations. No near syncope or syncope, but will get what she describes as a sense of heaviness that comes over her body that is a perhaps weak feeling.  It is generalized sensation, random, and fairly fleeting. She has this sensation while here for a few moments, She was seated, happened to still be on the programmer with VP at 70 and her BP was 110/60 She is tolerating the Eliquis well, no bleeding or signs of bleeding. She denied a formal dx of HTN and was hoping to reduce her medication load, planned to wean off her metoprolol and asked to monitor her BP.  F/u with Dr. Caryl Comes Aug 2021, was doing well, ASA stopped, eliquis continued.  She was hospitalized 02/18/2020 w/BRBPR, underwent colonoscopy on 02/20/2020 with findings of mild diverticulosis, polyps and hemorrhoids.  Bleeding was  thought to be secondary to hemorrhoids exacerbated by Eliquis.  Per GI she can restart Eliquis from tomorrow.  Hemoglobin remained stable. There was mention in her d/c summary, pt was worried about bleeding with Eliquis, and inquired about warfarin, stating she had no problems with warfarin, and recommended to discuss with her cardiologist team.  *** TIA with subtherpaeutic INR by hx *** symptoms otherwise   AF history AAD hx Tikosyn d/c with progression to permanent AF 07/28/2019 > AV node ablation CVA 2/2 subtherapeutic INR >> ASA and Eliquis  >>> eliquis alone  Device information MDT single chamber PPM implanted 07/28/2019 Is s/p AV node ablation  Past Medical History:  Diagnosis Date  . Allergic rhinitis   . 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 Arthritis   . Atrial fibrillation -persistent cardiologist-  dr klein/  primary EP -- dr Tawanna Sat ([place])   a. s/p PVI [name] 2010;  b. on tikosyn/coumadin;  c. 05/2009 Echo: EF 60-65%, Gr 2 DD. (first dx 09/ 2007)  . Bilateral lower extremity edema   . Bleeding hemorrhoid   . Carotid stenosis    mild (hosp 3/11)- consult by vasc/ Dr Donnetta Hutching  . Complication of anesthesia    hard to wake  . Diverticulosis of colon   . Dyspnea    on exertion-climbing stairs  . Fatty liver   . H/O cardiac radiofrequency ablation    01/ 2008 at [facility] /  03/ 2010  at [facility]  . Heart failure with preserved ejection  fraction ([place])   . History of adenomatous polyp of colon    tubular adenoma's  . History of cardiomyopathy    secondary tachycardia-induced cardiomyopathy -- resolved 2014  . History of squamous cell carcinoma in situ (SCCIS) of skin    05/ 2017  nasal bridge and right medial knee  . History of transient ischemic attack (TIA)    01-24-2005 and 06-12-2009  . Hyperlipidemia   . Hypothyroidism   . Mild intermittent asthma    reacts to cats  . Mixed stress and urge urinary incontinence   . Pulmonary nodule   . S/P AV nodal ablation  07/28/19 07/29/2019  . S/P mitral valve repair 10-23-1998  dr Boyce Medici at [facility]   for MVP and regurg. (annuloplasty ring procedure)  . S/P placement of cardiac pacemaker MDT 07/28/19 07/29/2019    Past Surgical History:  Procedure Laterality Date  . APPENDECTOMY  1978  . AV NODE ABLATION N/A 07/28/2019   Procedure: AV NODE ABLATION;  Surgeon: Deboraha Sprang, MD;  Location: [place] [facility];  Service: Cardiovascular;  Laterality: N/A;  . BUBBLE STUDY  06/19/2019   Procedure: BUBBLE STUDY;  Surgeon: Pixie Casino, MD;  Location: [facility] ENDOSCOPY;  Service: Cardiovascular;;  . CARDIAC ELECTROPHYSIOLOGY [place]  01/ 2008    at [place]   right-sided ablation atrial flutter  . CARDIAC ELECTROPHYSIOLOGY STUDY AND ABLATION  03/ 2010   dr Jaymes Graff at [facility]   AV node ablation and pulmonary vein isolation for atrial fib  . CARDIOVERSION  06-18-2006;  07-13-2006;  10-19-2010;  10-27-2010  . COLONOSCOPY    . COLONOSCOPY WITH PROPOFOL N/A 10/13/2017   Procedure: COLONOSCOPY WITH PROPOFOL;  Surgeon: [name], MD;  Location: [facility] ENDOSCOPY;  Service: Gastroenterology;  Laterality: N/A;  . COLONOSCOPY WITH PROPOFOL N/A 02/20/2020   Procedure: COLONOSCOPY WITH PROPOFOL;  Surgeon: Lesly Rubenstein, MD;  Location: [facility] ENDOSCOPY;  Service: Endoscopy;  Laterality: N/A;  . CYSTO/ TRANSURETHRAL COLLAGEN INJECTION THERAPY  07-26-2007   dr Matilde Sprang  . DILATION AND CURETTAGE OF UTERUS    . ESOPHAGOGASTRODUODENOSCOPY (EGD) WITH PROPOFOL N/A 10/13/2017   Procedure: ESOPHAGOGASTRODUODENOSCOPY (EGD) WITH PROPOFOL;  Surgeon: [name], MD;  Location: [facility] ENDOSCOPY;  Service: Gastroenterology;  Laterality: N/A;  . EXCISIONAL HEMORRHOIDECTOMY  1980s  . GIVENS CAPSULE STUDY N/A 12/08/2017   Procedure: GIVENS CAPSULE STUDY;  Surgeon: [name], MD;  Location: [facility] ENDOSCOPY;  Service: Gastroenterology;  Laterality: N/A;  . HEMORRHOID SURGERY N/A 10/29/2016   Procedure:  HEMORRHOIDECTOMY;  Surgeon: Leighton Ruff, MD;  Location: [facility];  Service: General;  Laterality: N/A;  . MITRAL VALVE ANNULOPLASTY  10/23/1998   "Model [id number]; [employer] [id number]"; size 51mm; [facility]; Dr. Boyce Medici  . PACEMAKER IMPLANT N/A 07/28/2019   Procedure: PACEMAKER IMPLANT;  Surgeon: Deboraha Sprang, MD;  Location: [place] [facility] LAB;  Service: Cardiovascular;  Laterality: N/A;  . [place]  . TEE WITH CARDIOVERSION  05-06-2006 at [facility];  01-02-2013 at [facility]  . TEE WITHOUT CARDIOVERSION N/A 06/19/2019   Procedure: TRANSESOPHAGEAL ECHOCARDIOGRAM (TEE);  Surgeon: Pixie Casino, MD;  Location: [facility] ENDOSCOPY;  Service: Cardiovascular;  Laterality: N/A;  . TOTAL HIP ARTHROPLASTY Left 05/04/2017   Procedure: LEFT TOTAL HIP ARTHROPLASTY ANTERIOR APPROACH;  Surgeon: Mcarthur Rossetti, MD;  Location: [place];  Service: Orthopedics;  Laterality: Left;  . TRANSTHORACIC ECHOCARDIOGRAM  05-01-2015   dr Caryl Comes   ef 50-55%/  mild AV sclerosis without stenosis/  post MV repair with mild central MR (valve area by pressure half-time 2cm^2,  valve area by continutity equation 0.91cm^2, peak grandiant 38mmHg)/  severe LAE/ mild TR/ mild RAE   . TUBAL LIGATION Bilateral 1978    Current Outpatient Medications  Medication Sig Dispense Refill  . acetaminophen (TYLENOL) 325 MG tablet Take 325 mg by mouth every 6 (six) hours as needed for moderate pain or headache.    . albuterol (PROVENTIL HFA;VENTOLIN HFA) 108 (90 Base) MCG/ACT inhaler Inhale 2 puffs into the lungs every 4 (four) hours as needed for wheezing or shortness of breath. 1 Inhaler 0  . diclofenac Sodium (VOLTAREN) 1 % GEL Apply 1 application topically 4 (four) times daily as needed (pain).    [name] diphenhydrAMINE (BENADRYL) 25 mg capsule Take 50 mg by mouth every 6 (six) hours as needed for itching.    . diphenhydrAMINE-zinc acetate (BENADRYL) cream Apply 1 application topically 3 (three) times daily as needed  for itching.    [name] ELIQUIS 5 MG TABS tablet TAKE 1 TABLET TWICE DAILY 180 tablet 2  . ezetimibe (ZETIA) 10 MG tablet Take 1 tablet (10 mg total) by mouth daily. 90 tablet 3  . ferrous sulfate 325 (65 FE) MG EC tablet TAKE 1 TABLET EVERY DAY WITH BREAKFAST 90 tablet 3  . fluticasone (FLONASE) 50 MCG/ACT nasal spray Place 1 spray into both nostrils daily as needed for allergies.    . furosemide (LASIX) 40 MG tablet Take 40 mg by mouth daily as needed for edema.    [name] levothyroxine (SYNTHROID) 25 MCG tablet TAKE 1 TABLET EVERY DAY BEFORE BREAKFAST 90 tablet 3  . loratadine (CLARITIN) 10 MG tablet Take 10 mg by mouth daily as needed for allergies.     Vladimir Faster Glycol-Propyl Glycol (LUBRICANT EYE DROPS) 0.4-0.3 % SOLN Place 1-2 drops into both eyes 3 (three) times daily as needed (for dry eyes.).     No current facility-administered medications for this visit.    Allergies:   Amiodarone hcl, Penicillins, and Statins   Social History:  The patient  reports that she has never smoked. She has never used smokeless tobacco. She reports current alcohol use. She reports that she does not use drugs.   Family History:  The patient's family history includes Alcohol abuse in her father; Cancer in her father; Lung cancer in her father.  ROS:  Please see the history of present illness.  All other systems are reviewed and otherwise negative.   PHYSICAL EXAM:  VS:  There were no vitals taken for this visit. BMI: There is no height or weight on file to calculate BMI. Well nourished, well developed, in no acute distress  HEENT: normocephalic, atraumatic  Neck: no JVD, carotid bruits or masses Cardiac:  *** RRR; no significant murmurs, no rubs, or gallops Lungs:  *** CTA b/l, no wheezing, rhonchi or rales  Abd: soft, nontender MS: no deformity or *** atrophy Ext: *** several superficial spider veins, trace edema  Skin: warm and dry, no rash Neuro:  No gross deficits appreciated Psych: euthymic mood, full  affect  PPM site: *** stable, no tethering, skin changes   EKG:  Not done today  PPM interrogation done today and reviewed by myself ***  05/01/15: TTE Study Conclusions - Left ventricle: Septal and apical hypokinesis The cavity size was   normal. Wall thickness was normal. Systolic function was normal.   The estimated ejection fraction was in the range of 50% to 55%. - Mitral valve: Post  mitral valve repair with mild central MR. - Left atrium: The atrium was severely dilated. - Right atrium: The atrium was mildly dilated. - Atrial septum: No defect or patent foramen ovale was identified.   06/19/2019: TEE IMPRESSIONS  1. Left ventricular ejection fraction, by estimation, is 55 to 60%. The  left ventricle has normal function. The left ventricle has no regional  wall motion abnormalities. There is mild left ventricular hypertrophy.  2. Right ventricular systolic function is normal. The right ventricular  size is normal.  3. Left atrial size was severely dilated. No left atrial/left atrial  appendage thrombus was detected.  4. The mitral valve has been repaired/replaced. Mild to moderate mitral  valve regurgitation. Mild mitral stenosis. There is a prosthetic  annuloplasty ring present in the mitral position. Procedure Date:  10/23/1998.  5. The tricuspid valve is abnormal. Tricuspid valve regurgitation is  moderate.  6. The aortic valve is tricuspid. Aortic valve regurgitation is not  visualized.  7. Late microbubble contrast suggestive of intrahepatic or intrapulmonary  shunt.     06/16/2019: TTE IMPRESSIONS  1. Left ventricular ejection fraction, by estimation, is 55 to 60%. The  left ventricle has normal function. The left ventricle has no regional  wall motion abnormalities. Left ventricular diastolic function could not  be evaluated.  2. Right ventricular systolic function is normal. The right ventricular  size is normal. There is mildly elevated pulmonary  artery systolic  pressure. The estimated right ventricular systolic pressure is 67.8 mmHg.  3. Left atrial size was severely dilated.  4. A mitral valve annuloplasty ring is present. There is a heavily  calcified focal mass-like structure present on the PMVL. This is likely  deterioration of the PMVL with mitral annular calcification. There are no  mobile components to it. There is no  apparent destruction of the valve. There is mild to moderate MR. There is  mild mitral stenosis. This is best viewed in the PLAX. When compared with  the prior echo this was present but not specifically commented on. The  mitral valve has been  repaired/replaced. Mild to moderate mitral valve regurgitation. The mean  mitral valve gradient is 6.3 mmHg with average heart rate of 80 bpm. There  is a prosthetic annuloplasty ring present in the mitral position.  Procedure Date: 10/23/1998.  5. The aortic valve is tricuspid. Aortic valve regurgitation is not  visualized. Mild aortic valve sclerosis is present, with no evidence of  aortic valve stenosis.  6. The inferior vena cava is dilated in size with <50% respiratory  variability, suggesting right atrial pressure of 15 mmHg.   Comparison(s): A prior study was performed on 12/01/2016. No significant  change from prior study.     Recent Labs: 06/16/2019: TSH 3.817 02/18/2020: ALT 24; BUN 36; Creatinine, Ser 0.80; Potassium 3.9; Sodium 143 02/21/2020: Hemoglobin 11.9; Platelets 122  06/17/2019: Cholesterol 218; HDL 28; LDL Cholesterol 169; Total CHOL/HDL Ratio 7.8; Triglycerides 104; VLDL 21   Estimated Creatinine Clearance: 52.4 mL/min (by C-G formula based on SCr of 0.8 mg/dL).   Wt Readings from Last 3 Encounters:  02/21/20 167 lb 3.2 oz (75.8 kg)  10/31/19 184 lb (83.5 kg)  08/28/19 190 lb (86.2 kg)     Other studies reviewed: Additional studies/records reviewed today include: summarized above  ASSESSMENT AND PLAN:  1. Permanent AFib > AV node  ablation     CHA2DS2Vasc is 6, *** eliquis, appropriately dosed   2. PPM s/p AVNode ablation     ***  Intact function     ***   3. HTN     She denies HTN, states all of her medicines historically have been 2/2 her AFib     She would like to be off as many medicines as possible     ***    Disposition: ***   Current medicines are reviewed at length with the patient today.  The patient did not have any concerns regarding medicines.  Haywood Lasso, PA-C 02/26/2020 7:43 AM     [facility] [address] [place]  [address] [phone] (office)  [phone] (fax)

## 2020-02-27 ENCOUNTER — Telehealth: Payer: Self-pay | Admitting: Internal Medicine

## 2020-02-27 NOTE — Telephone Encounter (Signed)
Pt c/o medication issue:  1. Name of Medication: Eliquis  2. How are you currently taking this medication (dosage and times per day)? 2 times a day  3. Are you having a reaction (difficulty breathing--STAT)?  no   4. What is your medication issue? Break through bleeding- want to stop taking this

## 2020-02-27 NOTE — Telephone Encounter (Signed)
Spoke with pt who reports she was admitted last week for GI bleed.  See discharge summary in Epic for complete detail.  Pt's Eliquis was stopped while in the hospital and restarted at discharge.  Bleeding was thought to be due to Eliquis.  Pt reports she did restart Eliquis and began having some rectal bleeding last night and today.  Pt would like to restart Warfarin.    Pt advised she has an appt with Dr Olin Pia APP Tommye Standard, PA-C tomorrow am 12/8 at 815am for hospital f/u.  Pt advised to contact her GI provider to let him know she has started bleeding again.  Pt reports her last dose of Eliquis was taken this am.  Pt advised to hold Eliquis until RN can speak with Dr Caryl Comes for further review and recommendation. Reviewed ED precautions and pt verbalizes understanding and agrees with current plan.

## 2020-02-27 NOTE — Telephone Encounter (Signed)
It sounds as if she may have hemorrhoidal bleeding which I would think would be controllable by GI Thanks SK

## 2020-02-28 ENCOUNTER — Ambulatory Visit: Payer: Medicare Other | Admitting: Physician Assistant

## 2020-02-29 NOTE — Telephone Encounter (Signed)
Spoke with [name] and advised pr Dr Caryl Comes bleeding should be controllable by GI.  [name] advised RN also had discussion with Tommye Standard, PA-C who was to see [name] 12/8 but [name] states there was some confusion re: her appointment apparently and that is why she missed appointment.  Per Renee Warfarin would not be the optimal drug of choice for her and unless [name] is having a large amount of bleeding should continue on her Eliquis.  [name] advised will have scheduler contact her re: rescheduling appointment for f/u.  [name] verbalizes understanding and agrees with current plan.

## 2020-03-03 ENCOUNTER — Telehealth: Payer: Self-pay | Admitting: Family Medicine

## 2020-03-03 DIAGNOSIS — D500 Iron deficiency anemia secondary to blood loss (chronic): Secondary | ICD-10-CM

## 2020-03-03 DIAGNOSIS — I4891 Unspecified atrial fibrillation: Secondary | ICD-10-CM

## 2020-03-03 DIAGNOSIS — R200 Anesthesia of skin: Secondary | ICD-10-CM

## 2020-03-03 DIAGNOSIS — E039 Hypothyroidism, unspecified: Secondary | ICD-10-CM

## 2020-03-03 DIAGNOSIS — R600 Localized edema: Secondary | ICD-10-CM

## 2020-03-03 DIAGNOSIS — R202 Paresthesia of skin: Secondary | ICD-10-CM

## 2020-03-03 DIAGNOSIS — E7849 Other hyperlipidemia: Secondary | ICD-10-CM

## 2020-03-03 DIAGNOSIS — R7309 Other abnormal glucose: Secondary | ICD-10-CM

## 2020-03-03 NOTE — Telephone Encounter (Signed)
-----   Message from Cloyd Stagers, RT sent at 02/20/2020  1:57 PM EST ----- Regarding: Lab Orders for Monday 12.13.2021 Please place lab orders for Monday 12.13.2021, office visit for physical on Monday 12.20.2021 Thank you, Dyke Maes RT(R)

## 2020-03-04 ENCOUNTER — Other Ambulatory Visit (INDEPENDENT_AMBULATORY_CARE_PROVIDER_SITE_OTHER): Payer: Medicare Other

## 2020-03-04 ENCOUNTER — Telehealth: Payer: Self-pay | Admitting: Gastroenterology

## 2020-03-04 ENCOUNTER — Other Ambulatory Visit: Payer: Self-pay

## 2020-03-04 DIAGNOSIS — I482 Chronic atrial fibrillation, unspecified: Secondary | ICD-10-CM

## 2020-03-04 DIAGNOSIS — Z5181 Encounter for therapeutic drug level monitoring: Secondary | ICD-10-CM | POA: Diagnosis not present

## 2020-03-04 DIAGNOSIS — D500 Iron deficiency anemia secondary to blood loss (chronic): Secondary | ICD-10-CM | POA: Diagnosis not present

## 2020-03-04 DIAGNOSIS — R600 Localized edema: Secondary | ICD-10-CM | POA: Diagnosis not present

## 2020-03-04 DIAGNOSIS — E039 Hypothyroidism, unspecified: Secondary | ICD-10-CM | POA: Diagnosis not present

## 2020-03-04 DIAGNOSIS — E7849 Other hyperlipidemia: Secondary | ICD-10-CM

## 2020-03-04 DIAGNOSIS — I4891 Unspecified atrial fibrillation: Secondary | ICD-10-CM

## 2020-03-04 DIAGNOSIS — R7309 Other abnormal glucose: Secondary | ICD-10-CM | POA: Diagnosis not present

## 2020-03-04 LAB — TSH: TSH: 7.77 u[IU]/mL — ABNORMAL HIGH (ref 0.35–4.50)

## 2020-03-04 LAB — CBC WITH DIFFERENTIAL/PLATELET
Basophils Absolute: 0 10*3/uL (ref 0.0–0.1)
Basophils Relative: 1 % (ref 0.0–3.0)
Eosinophils Absolute: 0.2 10*3/uL (ref 0.0–0.7)
Eosinophils Relative: 4.5 % (ref 0.0–5.0)
HCT: 37.2 % (ref 36.0–46.0)
Hemoglobin: 12.4 g/dL (ref 12.0–15.0)
Lymphocytes Relative: 32 % (ref 12.0–46.0)
Lymphs Abs: 1.3 10*3/uL (ref 0.7–4.0)
MCHC: 33.2 g/dL (ref 30.0–36.0)
MCV: 84.3 fl (ref 78.0–100.0)
Monocytes Absolute: 0.4 10*3/uL (ref 0.1–1.0)
Monocytes Relative: 8.9 % (ref 3.0–12.0)
Neutro Abs: 2.2 10*3/uL (ref 1.4–7.7)
Neutrophils Relative %: 53.6 % (ref 43.0–77.0)
Platelets: 168 10*3/uL (ref 150.0–400.0)
RBC: 4.42 Mil/uL (ref 3.87–5.11)
RDW: 15.7 % — ABNORMAL HIGH (ref 11.5–15.5)
WBC: 4 10*3/uL (ref 4.0–10.5)

## 2020-03-04 LAB — COMPREHENSIVE METABOLIC PANEL
ALT: 12 U/L (ref 0–35)
AST: 18 U/L (ref 0–37)
Albumin: 3.9 g/dL (ref 3.5–5.2)
Alkaline Phosphatase: 44 U/L (ref 39–117)
BUN: 24 mg/dL — ABNORMAL HIGH (ref 6–23)
CO2: 30 mEq/L (ref 19–32)
Calcium: 9.2 mg/dL (ref 8.4–10.5)
Chloride: 105 mEq/L (ref 96–112)
Creatinine, Ser: 0.79 mg/dL (ref 0.40–1.20)
GFR: 68.24 mL/min (ref >60.00)
Glucose, Bld: 93 mg/dL (ref 70–99)
Potassium: 5 mEq/L (ref 3.5–5.1)
Sodium: 144 mEq/L (ref 135–145)
Total Bilirubin: 0.5 mg/dL (ref 0.2–1.2)
Total Protein: 6.7 g/dL (ref 6.0–8.3)

## 2020-03-04 LAB — LIPID PANEL
Cholesterol: 178 mg/dL (ref 0–200)
HDL: 37.8 mg/dL — ABNORMAL LOW (ref >39.00)
LDL Cholesterol: 125 mg/dL — ABNORMAL HIGH (ref 0–99)
NonHDL: 140.07
Total CHOL/HDL Ratio: 5
Triglycerides: 76 mg/dL (ref 0.0–149.0)
VLDL: 15.2 mg/dL (ref 0.0–40.0)

## 2020-03-04 LAB — HEMOGLOBIN A1C: Hgb A1c MFr Bld: 5.6 % (ref 4.6–6.5)

## 2020-03-04 LAB — PROTIME-INR
INR: 1.3 ratio — ABNORMAL HIGH (ref 0.8–1.0)
Prothrombin Time: 14.6 s — ABNORMAL HIGH (ref 9.6–13.1)

## 2020-03-04 NOTE — Telephone Encounter (Signed)
Patient states she had to go the the ER for hemorrhaging and after an emergency colonoscopy they could not find anything. Pt states she is still bleeding and wants to know if Dr. Vicente Males can see her or suggest something ASAP. Please advise and let pt know.

## 2020-03-04 NOTE — Telephone Encounter (Signed)
Patient was scheduled for tomorrow @11AM . [name]

## 2020-03-04 NOTE — Telephone Encounter (Signed)
Is she bleeding presently? If yes ER if not bleeding will need repeat CBC and see me in the office- I can see her tomorrow morning since I have openin at around 1045 am

## 2020-03-05 ENCOUNTER — Ambulatory Visit (INDEPENDENT_AMBULATORY_CARE_PROVIDER_SITE_OTHER): Payer: Medicare Other | Admitting: Gastroenterology

## 2020-03-05 VITALS — BP 173/82 | HR 81 | Temp 97.9°F | Ht 65.0 in | Wt 165.0 lb

## 2020-03-05 DIAGNOSIS — Z8719 Personal history of other diseases of the digestive system: Secondary | ICD-10-CM

## 2020-03-05 DIAGNOSIS — K648 Other hemorrhoids: Secondary | ICD-10-CM | POA: Diagnosis not present

## 2020-03-05 DIAGNOSIS — Z09 Encounter for follow-up examination after completed treatment for conditions other than malignant neoplasm: Secondary | ICD-10-CM

## 2020-03-05 DIAGNOSIS — I639 Cerebral infarction, unspecified: Secondary | ICD-10-CM | POA: Diagnosis not present

## 2020-03-05 MED ORDER — HYDROCORTISONE ACETATE 25 MG RE SUPP: 25.0000 mg | Freq: Two times a day (BID) | RECTAL | 0 refills | Status: AC

## 2020-03-05 NOTE — Addendum Note (Signed)
Addended by: Dorethea Clan on: 03/05/2020 11:41 AM   Modules accepted: Orders

## 2020-03-05 NOTE — Progress Notes (Signed)
[name] MD, MRCP(U.K) [address]  [place]  [place], [place] [address]  Main: [phone]  Fax: [phone]   Primary Care Physician: Tower, Wynelle Fanny, MD  Primary Gastroenterologist:  Dr. Harland German follow up:   HPI: [name] is a 86 y.o. female   Summary of history :  She has been previously seen at my office for rectal bleeding, iron deficiency anemia and constipation. She has had her hemorrhoids resected at [facility]. 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 She was treated with Linzess for constipation 72 mcg and was doing well. 12/28/2018 CBC, iron studies normal. Capsule study of the small bowel showed a tiny nonbleeding AVM.  Interval history11/23/2020-03/05/2020  She was admitted on 02/18/2020 for hematochezia of 3 to 4 days duration.  She has been on Eliquis.  She underwent a CT angiogram abdomen and pelvis on 02/18/2020 that demonstrated no evidence of active bleeding.  Colonic diverticulosis was noted.  Small hiatal hernia was noted large amount of stool in the colon was noted.  Calcifications of the pancreatic head from chronic pancreatitis was noted.  She also has a pacemaker.  02/20/2020 she underwent a colonoscopy which showed 3 polyps ranging from 2 to 3 mm in size diverticulosis of the sigmoid and descending colon was noted.  Mild melanosis of the colon was also noted.  Nonbleeding internal hemorrhoids were noted.  Grade 1.2 out of 3 polyps were tubular adenomas.  Bleeding was thought to be secondary to hemorrhoids Eliquis.  He was discharged on 02/21/2020.  Advised to restart on Eliquis the following day.  03/04/2020 hemoglobin 12.4 g.  13 days back at discharge hemoglobin at 11.9 g.  CMP normal.  INR 1.3.  Since discharge she had another episode of rectal bleeding and she stopped her Eliquis and since has had no  further rectal bleeding.  Denies any perianal itching denies any constipation has multiple soft bowel movements per day.  No other complaints.    Current Outpatient Medications  Medication Sig Dispense Refill  . acetaminophen (TYLENOL) 325 MG tablet Take 325 mg by mouth every 6 (six) hours as needed for moderate pain or headache.    . albuterol (PROVENTIL HFA;VENTOLIN HFA) 108 (90 Base) MCG/ACT inhaler Inhale 2 puffs into the lungs every 4 (four) hours as needed for wheezing or shortness of breath. 1 Inhaler 0  . diclofenac Sodium (VOLTAREN) 1 % GEL Apply 1 application topically 4 (four) times daily as needed (pain).    [name] diphenhydrAMINE (BENADRYL) 25 mg capsule Take 50 mg by mouth every 6 (six) hours as needed for itching.    . diphenhydrAMINE-zinc acetate (BENADRYL) cream Apply 1 application topically 3 (three) times daily as needed for itching.    [name] ELIQUIS 5 MG TABS tablet TAKE 1 TABLET TWICE DAILY 180 tablet 2  . ezetimibe (ZETIA) 10 MG tablet Take 1 tablet (10 mg total) by mouth daily. 90 tablet 3  . ferrous sulfate 325 (65 FE) MG EC tablet TAKE 1 TABLET EVERY DAY WITH BREAKFAST 90 tablet 3  . fluticasone (FLONASE) 50 MCG/ACT nasal spray Place 1 spray into both nostrils daily as needed for allergies.    . furosemide (LASIX) 40 MG tablet Take 40 mg by mouth daily as needed for edema.    [name] levothyroxine (SYNTHROID) 25 MCG tablet TAKE 1 TABLET EVERY DAY BEFORE BREAKFAST 90 tablet 3  .  loratadine (CLARITIN) 10 MG tablet Take 10 mg by mouth daily as needed for allergies.     Vladimir Faster Glycol-Propyl Glycol (LUBRICANT EYE DROPS) 0.4-0.3 % SOLN Place 1-2 drops into both eyes 3 (three) times daily as needed (for dry eyes.).     No current facility-administered medications for this visit.    Allergies as of 03/05/2020 - Review Complete 02/20/2020  Allergen Reaction Noted  . Amiodarone hcl Swelling   . Penicillins Rash 04/27/2007  . Statins Rash     ROS:  General: Negative for anorexia,  weight loss, fever, chills, fatigue, weakness. ENT: Negative for hoarseness, difficulty swallowing , nasal congestion. CV: Negative for chest pain, angina, palpitations, dyspnea on exertion, peripheral edema.  Respiratory: Negative for dyspnea at rest, dyspnea on exertion, cough, sputum, wheezing.  GI: See history of present illness. GU:  Negative for dysuria, hematuria, urinary incontinence, urinary frequency, nocturnal urination.  Endo: Negative for unusual weight change.    BP (!) 173/82   Pulse 81   Temp 97.9 F (36.6 C)   Ht 5\' 5"  (1.651 m)   Wt 165 lb (74.8 kg)   BMI 27.46 kg/m   General Appearance:    Alert, cooperative, no distress, appears stated age  Head:    Normocephalic, without obvious abnormality, atraumatic  Eyes:    PERRL, conjunctiva/corneas clear, EOM's intact, fundi    benign, both eyes       Ears:    Normal TM's and external ear canals, both ears       Imaging Studies: CT Angio Abd/Pel W and/or Wo Contrast  Result Date: 02/18/2020 CLINICAL DATA:  GI bleed.  Rectal bleeding. EXAM: CTA ABDOMEN AND PELVIS WITHOUT AND WITH CONTRAST TECHNIQUE: Multidetector CT imaging of the abdomen and pelvis was performed using the standard protocol during bolus administration of intravenous contrast. Multiplanar reconstructed images and MIPs were obtained and reviewed to evaluate the vascular anatomy. CONTRAST:  127mL OMNIPAQUE IOHEXOL 350 MG/ML SOLN COMPARISON:  CT dated February 28, 2013 FINDINGS: VASCULAR Aorta: There are atherosclerotic changes of the abdominal aorta without evidence for an aneurysm Celiac: Patent without evidence of aneurysm, dissection, vasculitis or significant stenosis. SMA: Patent without evidence of aneurysm, dissection, vasculitis or significant stenosis. There is a replaced right hepatic artery, a normal variant. Renals: Both renal arteries are patent without evidence of aneurysm, dissection, vasculitis, fibromuscular dysplasia or significant stenosis. IMA:  Patent without evidence of aneurysm, dissection, vasculitis or significant stenosis. Inflow: Patent without evidence of aneurysm, dissection, vasculitis or significant stenosis. Proximal Outflow: Bilateral common femoral and visualized portions of the superficial and profunda femoral arteries are patent without evidence of aneurysm, dissection, vasculitis or significant stenosis. Veins: No obvious venous abnormality within the limitations of this arterial phase study. Review of the MIP images confirms the above findings. NON-VASCULAR Lower chest: The lung bases are clear. The heart size is normal. Hepatobiliary: The liver is normal. Normal gallbladder.There is no biliary ductal dilation. Pancreas: Small calcifications are noted at the pancreatic head. There is no definite underlying pancreatic mass. Spleen: Unremarkable. Adrenals/Urinary Tract: --Adrenal glands: Unremarkable. --Right kidney/ureter: No hydronephrosis or radiopaque kidney stones. --Left kidney/ureter: No hydronephrosis or radiopaque kidney stones. --Urinary bladder: Unremarkable. Stomach/Bowel: --Stomach/Duodenum: There is a small hiatal hernia. --Small bowel: Unremarkable. --Colon: There is scattered colonic diverticula without CT evidence for diverticulitis. There is a large amount of stool in the colon. --Appendix: Not visualized. No right lower quadrant inflammation or free fluid. Lymphatic: --No retroperitoneal lymphadenopathy. --No mesenteric lymphadenopathy. --No pelvic or inguinal  lymphadenopathy. Reproductive: Unremarkable Other: No ascites or free air. The abdominal wall is normal. Musculoskeletal. No acute displaced fractures. IMPRESSION: 1. No evidence for active GI bleeding. 2. Colonic diverticulosis without CT evidence for diverticulitis. 3. Small hiatal hernia. 4. Small calcifications at the pancreatic head may be secondary to chronic pancreatitis. 5. Large amount of stool in the colon. Aortic Atherosclerosis (ICD10-I70.0).  Electronically Signed   By: Constance Holster M.D.   On: 02/18/2020 03:57    Assessment and Plan:   NAMI STRAWDER is a 86 y.o. y/o female who has  previously been evaluated for iron deficiency anemia likely secondary to a hiatal hernia and AVMs.  Admitted in November 2021 with hematochezia while on Eliquis.  Colonoscopy showed internal hemorrhoids diverticulosis of the colon and bleeding felt secondary to hemorrhoids while on Eliquis.  She is here today for follow-up.  Hemoglobin is over 12 g.  Did not require any transfusion.  Had another episode of rectal bleeding.  She has had surgical hemorrhoidectomy in the past.  Suggest that she follow-up with the surgeon which we will try help make an appointment for.  Counseled on lifestyle changes for internal hemorrhoids including use of a sitz bath avoiding excess cleaning after bowel movement.  Trial of Anusol suppositories for 5 days.     Dr [name]  MD,MRCP [facility]) Follow up in as needed

## 2020-03-05 NOTE — Addendum Note (Signed)
Addended by: Dorethea Clan on: 03/05/2020 11:35 AM   Modules accepted: Orders

## 2020-03-07 NOTE — Progress Notes (Signed)
Cardiology Office Note Date:  03/07/2020  Patient ID:  [name], Key 10-04-1932, MRN [id number] PCP:  Abner Greenspan, MD  Cardiologist:  Dr. Caryl Comes   Chief Complaint:  GIB, eliquis  History of Present Illness: [name] is a 86 y.o. female with history of permanent AFib, hypothyroidism, asthma, HLD, TIA, CVA, AFlutter w/ ablation related to prior atriotomy undertaken at [facility] in March 2010, VHD s/p MV repair (2020), remote NICM with recovered LVEF, permanent Afib, >> Avnode ablation and PPM  I saw her June 2021 She comes in today to be seen for Dr. Caryl Comes, last seen by him 07/06/19, at that visit noted significant reduction in exertional capacity post stroke more DOE, and more despondent in general, seems she mentioned life not worth living feeling so poorly. She was planned for AVnode ablation, PPM hoping the reduction in meds/fatigue associated with them and better HR control would improve quality of life.. She feels markedly improved from pre-pacer/AV node ablation.  She has had a clear improvement in her exertional capacity.  Not quite whee she would like to be, but much better.  No CP, palpitations. No near syncope or syncope, but will get what she describes as a sense of heaviness that comes over her body that is a perhaps weak feeling.  It is generalized sensation, random, and fairly fleeting. She has this sensation while here for a few moments, She was seated, happened to still be on the programmer with VP at 70 and her BP was 110/60 She is tolerating the Eliquis well, no bleeding or signs of bleeding. She denied a formal dx of HTN and was hoping to reduce her medication load, planned to wean off her metoprolol and asked to monitor her BP.  F/u with Dr. Caryl Comes Aug 2021, was doing well, ASA stopped, eliquis continued.  She was hospitalized 02/18/2020 w/BRBPR, underwent colonoscopy on 02/20/2020 with findings of mild diverticulosis, polyps and hemorrhoids.  Bleeding was thought to  be secondary to hemorrhoids exacerbated by Eliquis.  Per GI she can restart Eliquis from tomorrow.  Hemoglobin remained stable. There was mention in her d/c summary, pt was worried about bleeding with Eliquis, and inquired about warfarin, stating she had no problems with warfarin, and recommended to discuss with her cardiologist team.  TODAY She saw GI yesterday and has been referred to a surgeon regarding her hemorrhoids. She is frustrated, was not aware or told until yesterday that she had hemorrhoids and di not note that in her record either. She had one day of BRBPR one day since home, none further, though feels like it was not trivial and is very weary of the Eliquis. Says someone here told her to resume it but once daily and follow up with me today. She is quite worried about her high BP of late, this has never been a problem for her  She has not had CP, plapitations or cardiac awareness, no SOB, no dizziness, near syncope or syncope.   AF history AAD hx Tikosyn d/c with progression to permanent AF 07/28/2019 > AV node ablation CVA 2/2 subtherapeutic INR >> ASA and Eliquis  >>> eliquis alone  Device information MDT single chamber PPM implanted 07/28/2019 Is s/p AV node ablation  Past Medical History:  Diagnosis Date  . Allergic rhinitis   . 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 Arthritis   . Atrial fibrillation -persistent cardiologist-  dr klein/  primary EP -- dr Tawanna Sat ([facility])   a. s/p PVI [name]  2010;  b. on tikosyn/coumadin;  c. 05/2009 Echo: EF 60-65%, Gr 2 DD. (first dx 09/ 2007)  . Bilateral lower extremity edema   . Bleeding hemorrhoid   . Carotid stenosis    mild (hosp 3/11)- consult by vasc/ Dr Donnetta Hutching  . Complication of anesthesia    hard to wake  . Diverticulosis of colon   . Dyspnea    on exertion-climbing stairs  . Fatty liver   . H/O cardiac radiofrequency ablation    01/ 2008 at [facility] [place] /  03/ 2010  at [facility]  . Heart failure with preserved  ejection fraction ([place])   . History of adenomatous polyp of colon    tubular adenoma's  . History of cardiomyopathy    secondary tachycardia-induced cardiomyopathy -- resolved 2014  . History of squamous cell carcinoma in situ (SCCIS) of skin    05/ 2017  nasal bridge and right medial knee  . History of transient ischemic attack (TIA)    01-24-2005 and 06-12-2009  . Hyperlipidemia   . Hypothyroidism   . Mild intermittent asthma    reacts to cats  . Mixed stress and urge urinary incontinence   . Pulmonary nodule   . S/P AV nodal ablation 07/28/19 07/29/2019  . S/P mitral valve repair 10-23-1998  dr Boyce Medici at [facility]   for MVP and regurg. (annuloplasty ring procedure)  . S/P placement of cardiac pacemaker MDT 07/28/19 07/29/2019    Past Surgical History:  Procedure Laterality Date  . APPENDECTOMY  1978  . AV NODE ABLATION N/A 07/28/2019   Procedure: AV NODE ABLATION;  Surgeon: Deboraha Sprang, MD;  Location: [facility];  Service: Cardiovascular;  Laterality: N/A;  . BUBBLE STUDY  06/19/2019   Procedure: BUBBLE STUDY;  Surgeon: Pixie Casino, MD;  Location: [facility] ENDOSCOPY;  Service: Cardiovascular;;  . CARDIAC ELECTROPHYSIOLOGY [place]  01/ 2008    at [place]   right-sided ablation atrial flutter  . CARDIAC ELECTROPHYSIOLOGY STUDY AND ABLATION  03/ 2010   dr Jaymes Graff at [facility]   AV node ablation and pulmonary vein isolation for atrial fib  . CARDIOVERSION  06-18-2006;  07-13-2006;  10-19-2010;  10-27-2010  . COLONOSCOPY    . COLONOSCOPY WITH PROPOFOL N/A 10/13/2017   Procedure: COLONOSCOPY WITH PROPOFOL;  Surgeon: [name], MD;  Location: [facility] ENDOSCOPY;  Service: Gastroenterology;  Laterality: N/A;  . COLONOSCOPY WITH PROPOFOL N/A 02/20/2020   Procedure: COLONOSCOPY WITH PROPOFOL;  Surgeon: Lesly Rubenstein, MD;  Location: [facility] ENDOSCOPY;  Service: Endoscopy;  Laterality: N/A;  . CYSTO/ TRANSURETHRAL COLLAGEN INJECTION THERAPY  07-26-2007    dr Matilde Sprang  . DILATION AND CURETTAGE OF UTERUS    . ESOPHAGOGASTRODUODENOSCOPY (EGD) WITH PROPOFOL N/A 10/13/2017   Procedure: ESOPHAGOGASTRODUODENOSCOPY (EGD) WITH PROPOFOL;  Surgeon: [name], MD;  Location: [facility] ENDOSCOPY;  Service: Gastroenterology;  Laterality: N/A;  . EXCISIONAL HEMORRHOIDECTOMY  1980s  . GIVENS CAPSULE STUDY N/A 12/08/2017   Procedure: GIVENS CAPSULE STUDY;  Surgeon: [name], MD;  Location: [facility] ENDOSCOPY;  Service: Gastroenterology;  Laterality: N/A;  . HEMORRHOID SURGERY N/A 10/29/2016   Procedure: HEMORRHOIDECTOMY;  Surgeon: Leighton Ruff, MD;  Location: [facility];  Service: General;  Laterality: N/A;  . MITRAL VALVE ANNULOPLASTY  10/23/1998   "Model [id number]; [employer] [id number]"; size 58mm; [facility]; Dr. Boyce Medici  . PACEMAKER IMPLANT N/A 07/28/2019   Procedure: PACEMAKER IMPLANT;  Surgeon: Deboraha Sprang, MD;  Location: [facility];  Service: Cardiovascular;  Laterality: N/A;  . PILONIDAL CYST EXCISION  1954  . TEE WITH CARDIOVERSION  05-06-2006 at [facility];  01-02-2013 at [facility]  . TEE WITHOUT CARDIOVERSION N/A 06/19/2019   Procedure: TRANSESOPHAGEAL ECHOCARDIOGRAM (TEE);  Surgeon: Pixie Casino, MD;  Location: [facility] ENDOSCOPY;  Service: Cardiovascular;  Laterality: N/A;  . TOTAL HIP ARTHROPLASTY Left 05/04/2017   Procedure: LEFT TOTAL HIP ARTHROPLASTY ANTERIOR APPROACH;  Surgeon: Mcarthur Rossetti, MD;  Location: [facility];  Service: Orthopedics;  Laterality: Left;  . TRANSTHORACIC ECHOCARDIOGRAM  05-01-2015   dr Caryl Comes   ef 50-55%/  mild AV sclerosis without stenosis/  post MV repair with mild central MR (valve area by pressure half-time 2cm^2,  valve area by continutity equation 0.91cm^2, peak grandiant 1mmHg)/  severe LAE/ mild TR/ mild RAE   . TUBAL LIGATION Bilateral 1978    Current Outpatient Medications  Medication Sig Dispense Refill  . acetaminophen (TYLENOL) 325 MG tablet Take 325 mg by mouth every 6 (six) hours as needed for  moderate pain or headache.    . albuterol (PROVENTIL HFA;VENTOLIN HFA) 108 (90 Base) MCG/ACT inhaler Inhale 2 puffs into the lungs every 4 (four) hours as needed for wheezing or shortness of breath. 1 Inhaler 0  . diclofenac Sodium (VOLTAREN) 1 % GEL Apply 1 application topically 4 (four) times daily as needed (pain).    [name] diphenhydrAMINE (BENADRYL) 25 mg capsule Take 50 mg by mouth every 6 (six) hours as needed for itching.    . diphenhydrAMINE-zinc acetate (BENADRYL) cream Apply 1 application topically 3 (three) times daily as needed for itching.    [name] ELIQUIS 5 MG TABS tablet TAKE 1 TABLET TWICE DAILY 180 tablet 2  . ezetimibe (ZETIA) 10 MG tablet Take 1 tablet (10 mg total) by mouth daily. 90 tablet 3  . ferrous sulfate 325 (65 FE) MG EC tablet TAKE 1 TABLET EVERY DAY WITH BREAKFAST 90 tablet 3  . fluticasone (FLONASE) 50 MCG/ACT nasal spray Place 1 spray into both nostrils daily as needed for allergies.    . furosemide (LASIX) 40 MG tablet Take 40 mg by mouth daily as needed for edema.    . hydrocortisone (ANUSOL-HC) 25 MG suppository Place 1 suppository (25 mg total) rectally 2 (two) times daily for 5 days. 10 suppository 0  . levothyroxine (SYNTHROID) 25 MCG tablet TAKE 1 TABLET EVERY DAY BEFORE BREAKFAST 90 tablet 3  . loratadine (CLARITIN) 10 MG tablet Take 10 mg by mouth daily as needed for allergies.     Vladimir Faster Glycol-Propyl Glycol (LUBRICANT EYE DROPS) 0.4-0.3 % SOLN Place 1-2 drops into both eyes 3 (three) times daily as needed (for dry eyes.).     No current facility-administered medications for this visit.    Allergies:   Amiodarone hcl, Penicillins, and Statins   Social History:  The patient  reports that she has never smoked. She has never used smokeless tobacco. She reports current alcohol use. She reports that she does not use drugs.   Family History:  The patient's family history includes Alcohol abuse in her father; Cancer in her father; Lung cancer in her  father.  ROS:  Please see the history of present illness.  All other systems are reviewed and otherwise negative.   PHYSICAL EXAM:  VS:  There were no vitals taken for this visit. BMI: There is no height or weight on file to calculate BMI. Well nourished, well developed, in no acute distress  HEENT: normocephalic, atraumatic  Neck: no JVD, carotid bruits or masses  Cardiac:  RRR; no significant murmurs, no rubs, or gallops Lungs:  CTA b/l, no wheezing, rhonchi or rales  Abd: soft, nontender MS: no deformity or atrophy Ext: several superficial spider veins, trace edema  Skin: warm and dry, no rash Neuro:  No gross deficits appreciated Psych: euthymic mood, full affect  PPM site: stable, no tethering, skin changes   EKG:  Not done today  PPM interrogation done today and reviewed by myself Battery and lead measurements are good A port plugged VP today at 40bpm One monitored VT episode, EGM is reviewed and is irregular and of different morphology then her VP  She is s/p AV node ablation, would suspect NSVT though quite irregular  05/01/15: TTE Study Conclusions - Left ventricle: Septal and apical hypokinesis The cavity size was   normal. Wall thickness was normal. Systolic function was normal.   The estimated ejection fraction was in the range of 50% to 55%. - Mitral valve: Post mitral valve repair with mild central MR. - Left atrium: The atrium was severely dilated. - Right atrium: The atrium was mildly dilated. - Atrial septum: No defect or patent foramen ovale was identified.   06/19/2019: TEE IMPRESSIONS  1. Left ventricular ejection fraction, by estimation, is 55 to 60%. The  left ventricle has normal function. The left ventricle has no regional  wall motion abnormalities. There is mild left ventricular hypertrophy.  2. Right ventricular systolic function is normal. The right ventricular  size is normal.  3. Left atrial size was severely dilated. No left atrial/left  atrial  appendage thrombus was detected.  4. The mitral valve has been repaired/replaced. Mild to moderate mitral  valve regurgitation. Mild mitral stenosis. There is a prosthetic  annuloplasty ring present in the mitral position. Procedure Date:  10/23/1998.  5. The tricuspid valve is abnormal. Tricuspid valve regurgitation is  moderate.  6. The aortic valve is tricuspid. Aortic valve regurgitation is not  visualized.  7. Late microbubble contrast suggestive of intrahepatic or intrapulmonary  shunt.     06/16/2019: TTE IMPRESSIONS  1. Left ventricular ejection fraction, by estimation, is 55 to 60%. The  left ventricle has normal function. The left ventricle has no regional  wall motion abnormalities. Left ventricular diastolic function could not  be evaluated.  2. Right ventricular systolic function is normal. The right ventricular  size is normal. There is mildly elevated pulmonary artery systolic  pressure. The estimated right ventricular systolic pressure is 07.3 mmHg.  3. Left atrial size was severely dilated.  4. A mitral valve annuloplasty ring is present. There is a heavily  calcified focal mass-like structure present on the PMVL. This is likely  deterioration of the PMVL with mitral annular calcification. There are no  mobile components to it. There is no  apparent destruction of the valve. There is mild to moderate MR. There is  mild mitral stenosis. This is best viewed in the PLAX. When compared with  the prior echo this was present but not specifically commented on. The  mitral valve has been  repaired/replaced. Mild to moderate mitral valve regurgitation. The mean  mitral valve gradient is 6.3 mmHg with average heart rate of 80 bpm. There  is a prosthetic annuloplasty ring present in the mitral position.  Procedure Date: 10/23/1998.  5. The aortic valve is tricuspid. Aortic valve regurgitation is not  visualized. Mild aortic valve sclerosis is present, with no  evidence of  aortic valve stenosis.  6. The inferior vena cava is dilated in  size with <50% respiratory  variability, suggesting right atrial pressure of 15 mmHg.   Comparison(s): A prior study was performed on 12/01/2016. No significant  change from prior study.     Recent Labs: 03/04/2020: ALT 12; BUN 24; Creatinine, Ser 0.79; Hemoglobin 12.4; Platelets 168.0; Potassium 5.0; Sodium 144; TSH 7.77  03/04/2020: Cholesterol 178; HDL 37.80; LDL Cholesterol 125; Total CHOL/HDL Ratio 5; Triglycerides 76.0; VLDL 15.2   Estimated Creatinine Clearance: 52 mL/min (by C-G formula based on SCr of 0.79 mg/dL).   Wt Readings from Last 3 Encounters:  03/05/20 165 lb (74.8 kg)  02/21/20 167 lb 3.2 oz (75.8 kg)  10/31/19 184 lb (83.5 kg)     Other studies reviewed: Additional studies/records reviewed today include: summarized above  ASSESSMENT AND PLAN:  1. Permanent AFib > AV node ablation     CHA2DS2Vasc is 6, eliquis, appropriately dosed  We taking eliquis daily does not provide her with any stroke reduction benefit. Discussed her stroke occurred on warfarin/subtherapeutic. Recommended to her that she resume her eliquis BID and urged her to follow up with the surgeon and take care of her hemerrhoids If she has recurrent bleeding might consider alternative [place]   2. PPM s/p AVNode ablation     Intact function     No programming changes made   3. HTN     I personally rechecked her BP twice, 126/70 and 124/72.     She is asked to invest if she can into a home BP cuff.   Disposition: will have her back in 57mo, sooner if needed   Current medicines are reviewed at length with the patient today.  The patient did not have any concerns regarding medicines.  Haywood Lasso, PA-C 03/07/2020 7:23 PM     [address] [place] [address] [phone] (office)  [phone] (fax)

## 2020-03-08 ENCOUNTER — Ambulatory Visit (INDEPENDENT_AMBULATORY_CARE_PROVIDER_SITE_OTHER): Payer: Medicare Other | Admitting: Physician Assistant

## 2020-03-08 ENCOUNTER — Encounter: Payer: Self-pay | Admitting: Physician Assistant

## 2020-03-08 ENCOUNTER — Other Ambulatory Visit: Payer: Self-pay

## 2020-03-08 ENCOUNTER — Ambulatory Visit (INDEPENDENT_AMBULATORY_CARE_PROVIDER_SITE_OTHER): Payer: Medicare Other

## 2020-03-08 VITALS — BP 150/88 | HR 75 | Ht 65.0 in | Wt 162.4 lb

## 2020-03-08 DIAGNOSIS — I10 Essential (primary) hypertension: Secondary | ICD-10-CM | POA: Diagnosis not present

## 2020-03-08 DIAGNOSIS — Z0000 Encounter for general adult medical examination without abnormal findings: Secondary | ICD-10-CM | POA: Diagnosis not present

## 2020-03-08 DIAGNOSIS — Z950 Presence of cardiac pacemaker: Secondary | ICD-10-CM | POA: Diagnosis not present

## 2020-03-08 DIAGNOSIS — I4821 Permanent atrial fibrillation: Secondary | ICD-10-CM

## 2020-03-08 DIAGNOSIS — I639 Cerebral infarction, unspecified: Secondary | ICD-10-CM

## 2020-03-08 NOTE — Progress Notes (Signed)
Subjective:   [name] is a 86 y.o. female who presents for Medicare Annual (Subsequent) preventive examination.  Review of Systems: N/A     I connected with the patient today by telephone and verified that I am speaking with the correct person using two identifiers. Location patient: home Location nurse: work Persons participating in the telephone visit: patient, nurse.   I discussed the limitations, risks, security and privacy concerns of performing an evaluation and management service by telephone and the availability of in person appointments. I also discussed with the patient that there may be a patient responsible charge related to this service. The patient expressed understanding and verbally consented to this telephonic visit.        Cardiac Risk Factors include: advanced age (>73men, >32 women);Other (see comment), Risk factor comments: hyperlipidemia     Objective:    Today's Vitals   03/08/20 1402  PainSc: 0-No pain   There is no height or weight on file to calculate BMI.  Advanced Directives 03/08/2020 02/18/2020 02/18/2020 07/28/2019 07/07/2019 06/16/2019 06/16/2019  Does Patient Have a Medical Advance Directive? Yes Yes No Yes Yes - Yes  Type of Paramedic of [place];Living will [place];Living will - [place];Living will [place];Living will [place];Living will [place];Living will  Does patient want to make changes to medical advance directive? - No - Patient declined - No - Patient declined - - -  Copy of [place] in Chart? No - copy requested No - copy requested - Yes - validated most recent copy scanned in chart (See row information) - - -  Would patient like information on creating a medical advance directive? - - - - - - -    Current Medications (verified) Outpatient Encounter Medications as of 03/08/2020   Medication Sig   acetaminophen (TYLENOL) 325 MG tablet Take 325 mg by mouth every 6 (six) hours as needed for moderate pain or headache.   albuterol (PROVENTIL HFA;VENTOLIN HFA) 108 (90 Base) MCG/ACT inhaler Inhale 2 puffs into the lungs every 4 (four) hours as needed for wheezing or shortness of breath.   diclofenac Sodium (VOLTAREN) 1 % GEL Apply 1 application topically 4 (four) times daily as needed (pain).   diphenhydrAMINE (BENADRYL) 25 mg capsule Take 50 mg by mouth every 6 (six) hours as needed for itching.   diphenhydrAMINE-zinc acetate (BENADRYL) cream Apply 1 application topically 3 (three) times daily as needed for itching.   ELIQUIS 5 MG TABS tablet TAKE 1 TABLET TWICE DAILY   ezetimibe (ZETIA) 10 MG tablet Take 1 tablet (10 mg total) by mouth daily.   ferrous sulfate 325 (65 FE) MG EC tablet TAKE 1 TABLET EVERY DAY WITH BREAKFAST   fluticasone (FLONASE) 50 MCG/ACT nasal spray Place 1 spray into both nostrils daily as needed for allergies.   furosemide (LASIX) 40 MG tablet Take 40 mg by mouth daily as needed for edema.   hydrocortisone (ANUSOL-HC) 25 MG suppository Place 1 suppository (25 mg total) rectally 2 (two) times daily for 5 days.   levothyroxine (SYNTHROID) 25 MCG tablet TAKE 1 TABLET EVERY DAY BEFORE BREAKFAST   loratadine (CLARITIN) 10 MG tablet Take 10 mg by mouth daily as needed for allergies.    Polyethyl Glycol-Propyl Glycol 0.4-0.3 % SOLN Place 1-2 drops into both eyes 3 (three) times daily as needed (for dry eyes.).   No facility-administered encounter medications on file as  of 03/08/2020.    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 s/p PVI [name] 2010;  b. on tikosyn/coumadin;  c. 05/2009 Echo: EF 60-65%,  Gr 2 DD. (first dx 09/ 2007)   Bilateral lower extremity edema    Bleeding hemorrhoid    Carotid stenosis    mild (hosp 3/11)- consult by vasc/ Dr Donnetta Hutching   Complication of anesthesia    hard to wake   Diverticulosis of colon    Dyspnea    on exertion-climbing stairs   Fatty liver    H/O cardiac radiofrequency ablation    01/ 2008 at [facility] /  03/ 2010  at [facility] failure with preserved ejection fraction [facility])    History of adenomatous polyp of colon    tubular adenoma's   History of cardiomyopathy    secondary tachycardia-induced cardiomyopathy -- resolved 2014   History of squamous cell carcinoma in situ (SCCIS) of skin    05/ 2017  nasal bridge and right medial knee   History of transient ischemic attack (TIA)    01-24-2005 and 06-12-2009   Hyperlipidemia    Hypothyroidism    Mild intermittent asthma    reacts to cats   Mixed stress and urge urinary incontinence    Pulmonary nodule    S/P AV nodal ablation 07/28/19 07/29/2019   S/P mitral valve repair 10-23-1998  dr Boyce Medici at [facility]   for MVP and regurg. (annuloplasty ring procedure)   S/P placement of cardiac pacemaker MDT 07/28/19 07/29/2019   Past Surgical History:  Procedure Laterality Date   APPENDECTOMY  1978   AV NODE ABLATION N/A 07/28/2019   Procedure: AV NODE ABLATION;  Surgeon: Deboraha Sprang, MD;  Location: [place] CV LAB;  Service: Cardiovascular;  Laterality: N/A;   BUBBLE STUDY  06/19/2019   Procedure: BUBBLE STUDY;  Surgeon: Pixie Casino, MD;  Location[facility] ENDOSCOPY;  Service: Cardiovascular;;   CARDIAC ELECTROPHYSIOLOGY [place]  01/ 2008    at [facility]   right-sided ablation atrial flutter   [place]  03/ 2010   dr Jaymes Graff at [facility]   AV node ablation and pulmonary vein isolation for atrial fib   CARDIOVERSION  06-18-2006;  07-13-2006;  10-19-2010;  10-27-2010   COLONOSCOPY      COLONOSCOPY WITH PROPOFOL N/A 10/13/2017   Procedure: COLONOSCOPY WITH PROPOFOL;  Surgeon: [name], MD;  Location: [facility] ENDOSCOPY;  Service: Gastroenterology;  Laterality: N/A;   COLONOSCOPY WITH PROPOFOL N/A 02/20/2020   Procedure: COLONOSCOPY WITH PROPOFOL;  Surgeon: Lesly Rubenstein, MD;  Location: [facility] ENDOSCOPY;  Service: Endoscopy;  Laterality: N/A;   CYSTO/ TRANSURETHRAL COLLAGEN INJECTION THERAPY  07-26-2007   dr Matilde Sprang   DILATION AND CURETTAGE OF UTERUS     ESOPHAGOGASTRODUODENOSCOPY (EGD) WITH PROPOFOL N/A 10/13/2017   Procedure: ESOPHAGOGASTRODUODENOSCOPY (EGD) WITH PROPOFOL;  Surgeon: [name], MD;  Location: [facility] ENDOSCOPY;  Service: Gastroenterology;  Laterality: N/A;   EXCISIONAL HEMORRHOIDECTOMY  1980s   GIVENS CAPSULE STUDY N/A 12/08/2017   Procedure: GIVENS CAPSULE STUDY;  Surgeon: [name], MD;  Location: [facility] ENDOSCOPY;  Service: Gastroenterology;  Laterality: N/A;   HEMORRHOID SURGERY N/A 10/29/2016   Procedure: HEMORRHOIDECTOMY;  Surgeon: Marcello Moores,  Elmo Putt, MD;  Location: [facility]  Service: General;  Laterality: N/A;   MITRAL VALVE ANNULOPLASTY  10/23/1998   "Model 4625; Seriel [id number]"; size 53mm; [facility]; Dr. Boyce Medici   PACEMAKER IMPLANT N/A 07/28/2019   Procedure: PACEMAKER IMPLANT;  Surgeon: Deboraha Sprang, MD;  Location: [place] CV LAB;  Service: Cardiovascular;  Laterality: N/A;   [place]   TEE WITH CARDIOVERSION  05-06-2006 at [facility];  01-02-2013 at [facility]   TEE WITHOUT CARDIOVERSION N/A 06/19/2019   Procedure: TRANSESOPHAGEAL ECHOCARDIOGRAM (TEE);  Surgeon: Pixie Casino, MD;  Location: [facility] ENDOSCOPY;  Service: Cardiovascular;  Laterality: N/A;   TOTAL HIP ARTHROPLASTY Left 05/04/2017   Procedure: LEFT TOTAL HIP ARTHROPLASTY ANTERIOR APPROACH;  Surgeon: Mcarthur Rossetti, MD;  Location: [place];  Service: Orthopedics;  Laterality: Left;   TRANSTHORACIC ECHOCARDIOGRAM  05-01-2015   dr Caryl Comes    ef 50-55%/  mild AV sclerosis without stenosis/  post MV repair with mild central MR (valve area by pressure half-time 2cm^2,  valve area by continutity equation 0.91cm^2, peak grandiant 7mmHg)/  severe LAE/ mild TR/ mild RAE    TUBAL LIGATION Bilateral 1978   Family History  Problem Relation Age of Onset   Lung cancer Father        smoker, died at 36   Alcohol abuse Father    Cancer Father        bladder and lung CA smoker   Breast cancer Neg Hx    Stroke Neg Hx    Social History   Socioeconomic History   Marital status: Widowed    Spouse name: Not on file   Number of children: 6   Years of education: Not on file   Highest education level: Not on file  Occupational History   Occupation: realtor    Employer: RETIRED  Tobacco Use   Smoking status: Never Smoker   Smokeless tobacco: Never Used  Vaping Use   Vaping Use: Never used  Substance and Sexual Activity   Alcohol use: Yes    Alcohol/week: 0.0 standard drinks    Comment: seldom   Drug use: No   Sexual activity: Not Currently  Other Topics Concern   Not on file  Social History Narrative   Retired. Daily Caffeine use: 2 daily    Social Determinants of Health   Financial Resource Strain: Low Risk    Difficulty of Paying Living Expenses: Not hard at all  Food Insecurity: No Food Insecurity   Worried About Charity fundraiser in the Last Year: Never true   Ran Out of Food in the Last Year: Never true  Transportation Needs: No Transportation Needs   Lack of Transportation (Medical): No   Lack of Transportation (Non-Medical): No  Physical Activity: Sufficiently Active   Days of Exercise per Week: 3 days   Minutes of Exercise per Session: 60 min  Stress: No Stress Concern Present   Feeling of Stress : Not at all  Social Connections: Not on file    Tobacco Counseling Counseling given: Not Answered   Clinical Intake:  Pre-visit preparation completed: Yes  Pain : No/denies  pain Pain Score: 0-No pain     Nutritional Risks: None Diabetes: No  How often do you need to have someone help you when you read instructions, pamphlets, or other written materials from your doctor or pharmacy?: 1 - Never What is the last grade level you completed in school?: associate  Diabetic: No Nutrition Risk Assessment:  Has the patient had any N/V/D within the last 2 months?  No  Does the patient have any non-healing wounds?  No  Has the patient had any unintentional weight loss or weight gain?  No   Diabetes:  Is the patient diabetic?  No  If diabetic, was a CBG obtained today?  N/A Did the patient bring in their glucometer from home?  N/A How often do you monitor your CBG's? N/A.   Financial Strains and Diabetes Management:  Are you having any financial strains with the device, your supplies or your medication? N/A.  Does the patient want to be seen by Chronic Care Management for management of their diabetes?  N/A Would the patient like to be referred to a Nutritionist or for Diabetic Management?  N/A    Interpreter Needed?: No  Information entered by :: CJohnson, LPN   Activities of Daily Living In your present state of health, do you have any difficulty performing the following activities: 03/08/2020 02/18/2020  Hearing? Y -  Comment wears hearing aids -  Vision? N -  Difficulty concentrating or making decisions? Y -  Comment has recall issues -  Walking or climbing stairs? N -  Dressing or bathing? N -  Doing errands, shopping? N N  Preparing Food and eating ? N -  Using the Toilet? N -  In the past six months, have you accidently leaked urine? N -  Do you have problems with loss of bowel control? N -  Managing your Medications? N -  Managing your Finances? N -  Housekeeping or managing your Housekeeping? N -  Some recent data might be hidden    Patient Care Team: [place], Wynelle Fanny, MD as PCP - General Deboraha Sprang, MD as PCP - Cardiology  (Cardiology)  Indicate any recent Medical Services you may have received from other than [facility] providers in the past year (date may be approximate).     Assessment:   This is a routine wellness examination for [facility].  Hearing/Vision screen  Hearing Screening   125Hz  250Hz  500Hz  1000Hz  2000Hz  3000Hz  4000Hz  6000Hz  8000Hz   Right ear:           Left ear:           Vision Screening Comments: Patient gets annual eye exams   Dietary issues and exercise activities discussed: Current Exercise Habits: Structured exercise class, Type of exercise: Other - see comments (water aerobics), Time (Minutes): 60, Frequency (Times/Week): 3, Weekly Exercise (Minutes/Week): 180, Intensity: Moderate, Exercise limited by: None identified  Goals     Patient Stated     03/08/2020, I will continue to do water aerobics at the [facility] 3 days a week for 1 hour.     Weight (lb) < 160 lb (72.6 kg)     Starting 09/05/2018, I will continue walk 10-15 minutes daily.       Depression Screen PHQ 2/9 Scores 03/08/2020 09/05/2018 09/01/2017 08/26/2016 05/15/2015  PHQ - 2 Score 0 0 0 0 0  PHQ- 9 Score 0 0 0 - -    Fall Risk Fall Risk  03/08/2020 09/05/2018 10/29/2017 09/01/2017 08/26/2016  Falls in the past year? 0 0 No Yes No  Comment - - - pt tripped causing multiple bruises -  Number falls in past yr: 0 - - 1 -  Injury with Fall? 0 - - Yes -  Risk for fall due to : Medication side effect - - - -  Follow up Falls evaluation completed;Falls prevention  discussed - - - -    FALL RISK PREVENTION PERTAINING TO THE HOME:  Any stairs in or around the home? Yes  If so, are there any without handrails? No  Home free of loose throw rugs in walkways, pet beds, electrical cords, etc? Yes  Adequate lighting in your home to reduce risk of falls? Yes   ASSISTIVE DEVICES UTILIZED TO PREVENT FALLS:  Life alert? No  Use of a cane, walker or w/c? No  Grab bars in the bathroom? No  Shower chair or bench in shower? No  Elevated toilet  seat or a handicapped toilet? No   TIMED UP AND GO:  Was the test performed? N/A, telephone visit .   Cognitive Function: MMSE - Mini Mental State Exam 03/08/2020 09/05/2018 09/01/2017 08/26/2016  Orientation to time 5 5 5 5   Orientation to Place 5 5 5 5   Registration 3 3 3 3   Attention/ Calculation 5 0 0 0  Recall 3 3 3 2   Recall-comments - - - pt was unable to recall 1 of 3 words  Language- name 2 objects - 0 0 0  Language- repeat 1 1 1 1   Language- follow 3 step command - 0 3 3  Language- read & follow direction - 0 0 0  Write a sentence - 0 0 0  Copy design - 0 0 0  Total score - 17 20 19   Mini Cog  Mini-Cog screen was completed. Maximum score is 22. A value of 0 denotes this part of the MMSE was not completed or the patient failed this part of the Mini-Cog screening.       Immunizations Immunization History  Administered Date(s) Administered   Influenza,inj,Quad PF,6+ Mos 01/19/2017   Pneumococcal Conjugate-13 08/26/2016   Pneumococcal Polysaccharide-23 09/01/2017   Td 02/03/2005    TDAP status: Due, Education has been provided regarding the importance of this vaccine. Advised may receive this vaccine at local pharmacy or Health Dept. Aware to provide a copy of the vaccination record if obtained from local pharmacy or Health Dept. Verbalized acceptance and understanding.  Flu Vaccine status: Declined, Education has been provided regarding the importance of this vaccine but patient still declined. Advised may receive this vaccine at local pharmacy or Health Dept. Aware to provide a copy of the vaccination record if obtained from local pharmacy or Health Dept. Verbalized acceptance and understanding.  Pneumococcal vaccine status: Up to date  Covid-19 vaccine status: Declined, Education has been provided regarding the importance of this vaccine but patient still declined. Advised may receive this vaccine at local pharmacy or Health Dept.or vaccine clinic. Aware to provide  a copy of the vaccination record if obtained from local pharmacy or Health Dept. Verbalized acceptance and understanding.  Qualifies for Shingles Vaccine? Yes   Zostavax completed No   Shingrix Completed?: No.    Education has been provided regarding the importance of this vaccine. Patient has been advised to call insurance company to determine out of pocket expense if they have not yet received this vaccine. Advised may also receive vaccine at local pharmacy or Health Dept. Verbalized acceptance and understanding.  Screening Tests Health Maintenance  Topic Date Due   INFLUENZA VACCINE  06/20/2020 (Originally 10/22/2019)   COVID-19 Vaccine (1) 07/22/2020 (Originally 10/28/1944)   TETANUS/TDAP  02/02/2025 (Originally 02/04/2015)   DEXA SCAN  Completed   PNA vac Low Risk Adult  Completed    Health Maintenance  There are no preventive care reminders to display for this patient.  Colorectal cancer screening: Type of screening: Colonoscopy. Completed 02/20/2020. Repeat every 3 years  Mammogram status: Completed 04/25/2019. Repeat every year  Bone Density status: Completed 04/25/2019. Results reflect: Bone density results: OSTEOPENIA. Repeat every 2 years.  Lung Cancer Screening: (Low Dose CT Chest recommended if Age 20-80 years, 30 pack-year currently smoking OR have quit w/in 15 years.) does not qualify.    Additional Screening:  Hepatitis C Screening: does not qualify; Completed N/A  Vision Screening: Recommended annual ophthalmology exams for early detection of glaucoma and other disorders of the eye. Is the patient up to date with their annual eye exam?  Yes  Who is the provider or what is the name of the office in which the patient attends annual eye exams? [facility]  If pt is not established with a provider, would they like to be referred to a provider to establish care? No .   Dental Screening: Recommended annual dental exams for proper oral hygiene  Community  Resource Referral / Chronic Care Management: CRR required this visit?  No   CCM required this visit?  No      Plan:     I have personally reviewed and noted the following in the patients chart:    Medical and social history  Use of alcohol, tobacco or illicit drugs   Current medications and supplements  Functional ability and status  Nutritional status  Physical activity  Advanced directives  List of other physicians  Hospitalizations, surgeries, and ER visits in previous 12 months  Vitals  Screenings to include cognitive, depression, and falls  Referrals and appointments  In addition, I have reviewed and discussed with patient certain preventive protocols, quality metrics, and best practice recommendations. A written personalized care plan for preventive services as well as general preventive health recommendations were provided to patient.   Due to this being a telephonic visit, the after visit summary with patients personalized plan was offered to patient via office or my-chart. Patient preferred to pick up at office at next visit or via mychart.   Andrez Grime, LPN   [id number]

## 2020-03-08 NOTE — Patient Instructions (Signed)
Medication Instructions:   MAKE SURE YOU TAKE YOUR ELIQUIS 5 MG TWICE A DAY   *If you need a refill on your cardiac medications before your next appointment, please call your pharmacy*   Lab Work: NONE ORDERED  TODAY   If you have labs (blood work) drawn today and your tests are completely normal, you will receive your results only by:  [place] (if you have MyChart) OR  A paper copy in the mail If you have any lab test that is abnormal or we need to change your treatment, we will call you to review the results.   Testing/Procedures: NONE ORDERED  TODAY     Follow-Up: At [facility], you and your health needs are our priority.  As part of our continuing mission to provide you with exceptional heart care, we have created designated Provider Care Teams.  These Care Teams include your primary Cardiologist (physician) and Advanced Practice Providers (APPs -  Physician Assistants and Nurse Practitioners) who all work together to provide you with the care you need, when you need it.  We recommend signing up for the patient portal called "MyChart".  Sign up information is provided on this After Visit Summary.  MyChart is used to connect with patients for Virtual Visits (Telemedicine).  Patients are able to view lab/test results, encounter notes, upcoming appointments, etc.  Non-urgent messages can be sent to your provider as well.   To learn more about what you can do with MyChart, go to [email or web address].    Your next appointment:   2 month(s)  The format for your next appointment:   In Person  Provider:    You may see Dr. Pat Kocher, PA-C     Other Instructions

## 2020-03-08 NOTE — Patient Instructions (Signed)
[name] , Thank you for taking time to come for your Medicare Wellness Visit. I appreciate your ongoing commitment to your health goals. Please review the following plan we discussed and let me know if I can assist you in the future.   Screening recommendations/referrals: Colonoscopy: Up to date, completed 02/20/2020, due 01/2023 Mammogram: Up to date, completed 04/25/2019, due 04/2020 Bone Density: Up to date, completed 04/25/2019, due 04/2021 Recommended yearly ophthalmology/optometry visit for glaucoma screening and checkup Recommended yearly dental visit for hygiene and checkup  Vaccinations: Influenza vaccine: declined Pneumococcal vaccine: Completed series Tdap vaccine: decline-insurance Shingles vaccine: due, check with your insurance regarding coverage if interested    Covid-19:declined  Advanced directives: Please bring a copy of your POA (Power of Attorney) and/or Living Will to your next appointment.   Conditions/risks identified: hyperlipidemia   Next appointment: Follow up in one year for your annual wellness visit    Preventive Care 65 Years and Older, Female Preventive care refers to lifestyle choices and visits with your health care provider that can promote health and wellness. What does preventive care include?  A yearly physical exam. This is also called an annual well check.  Dental exams once or twice a year.  Routine eye exams. Ask your health care provider how often you should have your eyes checked.  Personal lifestyle choices, including:  Daily care of your teeth and gums.  Regular physical activity.  Eating a healthy diet.  Avoiding tobacco and drug use.  Limiting alcohol use.  Practicing safe sex.  Taking low-dose aspirin every day.  Taking vitamin and mineral supplements as recommended by your health care provider. What happens during an annual well check? The services and screenings done by your health care provider during your annual well  check will depend on your age, overall health, lifestyle risk factors, and family history of disease. Counseling  Your health care provider may ask you questions about your:  Alcohol use.  Tobacco use.  Drug use.  Emotional well-being.  Home and relationship well-being.  Sexual activity.  Eating habits.  History of falls.  Memory and ability to understand (cognition).  Work and work Statistician.  Reproductive health. Screening  You may have the following tests or measurements:  Height, weight, and BMI.  Blood pressure.  Lipid and cholesterol levels. These may be checked every 5 years, or more frequently if you are over 65 years old.  Skin check.  Lung cancer screening. You may have this screening every year starting at age 71 if you have a 30-pack-year history of smoking and currently smoke or have quit within the past 15 years.  Fecal occult blood test (FOBT) of the stool. You may have this test every year starting at age 24.  Flexible sigmoidoscopy or colonoscopy. You may have a sigmoidoscopy every 5 years or a colonoscopy every 10 years starting at age 75.  Hepatitis C blood test.  Hepatitis B blood test.  Sexually transmitted disease (STD) testing.  Diabetes screening. This is done by checking your blood sugar (glucose) after you have not eaten for a while (fasting). You may have this done every 1-3 years.  Bone density scan. This is done to screen for osteoporosis. You may have this done starting at age 38.  Mammogram. This may be done every 1-2 years. Talk to your health care provider about how often you should have regular mammograms. Talk with your health care provider about your test results, treatment options, and if necessary, the need for  more tests. Vaccines  Your health care provider may recommend certain vaccines, such as:  Influenza vaccine. This is recommended every year.  Tetanus, diphtheria, and acellular pertussis (Tdap, Td) vaccine. You  may need a Td booster every 10 years.  Zoster vaccine. You may need this after age 33.  Pneumococcal 13-valent conjugate (PCV13) vaccine. One dose is recommended after age 45.  Pneumococcal polysaccharide (PPSV23) vaccine. One dose is recommended after age 20. Talk to your health care provider about which screenings and vaccines you need and how often you need them. This information is not intended to replace advice given to you by your health care provider. Make sure you discuss any questions you have with your health care provider. Document Released: 04/05/2015 Document Revised: 11/27/2015 Document Reviewed: 01/08/2015 Elsevier Interactive Patient Education  2017 [employer] Prevention in the Home Falls can cause injuries. They can happen to people of all ages. There are many things you can do to make your home safe and to help prevent falls. What can I do on the outside of my home?  Regularly fix the edges of walkways and driveways and fix any cracks.  Remove anything that might make you trip as you walk through a door, such as a raised step or threshold.  Trim any bushes or trees on the path to your home.  Use bright outdoor lighting.  Clear any walking paths of anything that might make someone trip, such as rocks or tools.  Regularly check to see if handrails are loose or broken. Make sure that both sides of any steps have handrails.  Any raised decks and porches should have guardrails on the edges.  Have any leaves, snow, or ice cleared regularly.  Use sand or salt on walking paths during winter.  Clean up any spills in your garage right away. This includes oil or grease spills. What can I do in the bathroom?  Use night lights.  Install grab bars by the toilet and in the tub and shower. Do not use towel bars as grab bars.  Use non-skid mats or decals in the tub or shower.  If you need to sit down in the shower, use a plastic, non-slip stool.  Keep the floor  dry. Clean up any water that spills on the floor as soon as it happens.  Remove soap buildup in the tub or shower regularly.  Attach bath mats securely with double-sided non-slip rug tape.  Do not have throw rugs and other things on the floor that can make you trip. What can I do in the bedroom?  Use night lights.  Make sure that you have a light by your bed that is easy to reach.  Do not use any sheets or blankets that are too big for your bed. They should not hang down onto the floor.  Have a firm chair that has side arms. You can use this for support while you get dressed.  Do not have throw rugs and other things on the floor that can make you trip. What can I do in the kitchen?  Clean up any spills right away.  Avoid walking on wet floors.  Keep items that you use a lot in easy-to-reach places.  If you need to reach something above you, use a strong step stool that has a grab bar.  Keep electrical cords out of the way.  Do not use floor polish or wax that makes floors slippery. If you must use wax, use non-skid  floor wax.  Do not have throw rugs and other things on the floor that can make you trip. What can I do with my stairs?  Do not leave any items on the stairs.  Make sure that there are handrails on both sides of the stairs and use them. Fix handrails that are broken or loose. Make sure that handrails are as long as the stairways.  Check any carpeting to make sure that it is firmly attached to the stairs. Fix any carpet that is loose or worn.  Avoid having throw rugs at the top or bottom of the stairs. If you do have throw rugs, attach them to the floor with carpet tape.  Make sure that you have a light switch at the top of the stairs and the bottom of the stairs. If you do not have them, ask someone to add them for you. What else can I do to help prevent falls?  Wear shoes that:  Do not have high heels.  Have rubber bottoms.  Are comfortable and fit you  well.  Are closed at the toe. Do not wear sandals.  If you use a stepladder:  Make sure that it is fully opened. Do not climb a closed stepladder.  Make sure that both sides of the stepladder are locked into place.  Ask someone to hold it for you, if possible.  Clearly mark and make sure that you can see:  Any grab bars or handrails.  First and last steps.  Where the edge of each step is.  Use tools that help you move around (mobility aids) if they are needed. These include:  Canes.  Walkers.  Scooters.  Crutches.  Turn on the lights when you go into a dark area. Replace any light bulbs as soon as they burn out.  Set up your furniture so you have a clear path. Avoid moving your furniture around.  If any of your floors are uneven, fix them.  If there are any pets around you, be aware of where they are.  Review your medicines with your doctor. Some medicines can make you feel dizzy. This can increase your chance of falling. Ask your doctor what other things that you can do to help prevent falls. This information is not intended to replace advice given to you by your health care provider. Make sure you discuss any questions you have with your health care provider. Document Released: 01/03/2009 Document Revised: 08/15/2015 Document Reviewed: 04/13/2014 Elsevier Interactive Patient Education  2017 [employer].

## 2020-03-08 NOTE — Progress Notes (Signed)
PCP notes:  Health Maintenance: Flu- declined Covid- declined   Abnormal Screenings: none   Patient concerns: none   Nurse concerns: none   Next PCP appt.: 03/11/2020 @ 9 am

## 2020-03-11 ENCOUNTER — Encounter: Payer: Self-pay | Admitting: Family Medicine

## 2020-03-11 ENCOUNTER — Other Ambulatory Visit: Payer: Self-pay

## 2020-03-11 ENCOUNTER — Ambulatory Visit (INDEPENDENT_AMBULATORY_CARE_PROVIDER_SITE_OTHER): Payer: Medicare Other | Admitting: Family Medicine

## 2020-03-11 VITALS — BP 124/78 | HR 81 | Temp 96.9°F | Ht 64.0 in | Wt 163.6 lb

## 2020-03-11 DIAGNOSIS — Z0000 Encounter for general adult medical examination without abnormal findings: Secondary | ICD-10-CM | POA: Insufficient documentation

## 2020-03-11 DIAGNOSIS — R7309 Other abnormal glucose: Secondary | ICD-10-CM | POA: Diagnosis not present

## 2020-03-11 DIAGNOSIS — M8589 Other specified disorders of bone density and structure, multiple sites: Secondary | ICD-10-CM | POA: Diagnosis not present

## 2020-03-11 DIAGNOSIS — E7849 Other hyperlipidemia: Secondary | ICD-10-CM

## 2020-03-11 DIAGNOSIS — R102 Pelvic and perineal pain: Secondary | ICD-10-CM

## 2020-03-11 DIAGNOSIS — K625 Hemorrhage of anus and rectum: Secondary | ICD-10-CM | POA: Diagnosis not present

## 2020-03-11 DIAGNOSIS — D500 Iron deficiency anemia secondary to blood loss (chronic): Secondary | ICD-10-CM

## 2020-03-11 DIAGNOSIS — E039 Hypothyroidism, unspecified: Secondary | ICD-10-CM | POA: Diagnosis not present

## 2020-03-11 MED ORDER — ALENDRONATE SODIUM 70 MG PO TABS: 70.0000 mg | ORAL_TABLET | ORAL | 0 refills | Status: DC

## 2020-03-11 MED ORDER — LEVOTHYROXINE SODIUM 50 MCG PO TABS: 50.0000 ug | ORAL_TABLET | Freq: Every day | ORAL | 3 refills | Status: DC

## 2020-03-11 NOTE — Assessment & Plan Note (Signed)
Reviewed health habits including diet and exercise and skin cancer prevention Reviewed appropriate screening tests for age  Also reviewed health mt list, fam hx and immunization status , as well as social and family history   See HPI Labs reviewed  Enc to get back to exercise  Will f/u for pelvic exam for pelvic discomfort Declines flu , covid and shigrix vaccines  Offered to answer questions  No falls or fx , on fosamax for osteopenia

## 2020-03-11 NOTE — Patient Instructions (Addendum)
For bones take vitamin D3 over the counter 2000 iu daily   Go up to 50 mcg of levothyroxine daily for thyroid  We need to re check in 6 weeks   Schedule a follow up appt. For a pelvic exam   Take care of yourself

## 2020-03-11 NOTE — Progress Notes (Signed)
Subjective:    Patient ID: [name], female    DOB: January 12, 1933, 86 y.o.   MRN: [id number]  This visit occurred during the SARS-CoV-2 public health emergency.  Safety protocols were in place, including screening questions prior to the visit, additional usage of staff PPE, and extensive cleaning of exam room while observing appropriate contact time as indicated for disinfecting solutions.    HPI Pt presents for annual f/u of chronic health problems   Wt Readings from Last 3 Encounters:  03/11/20 163 lb 9 oz (74.2 kg)  03/08/20 162 lb 6.4 oz (73.7 kg)  03/05/20 165 lb (74.8 kg)   28.08 kg/m  Staying busy around the house  Wants to get back to exercise  Eating well - gained wt and lost it again (was up to 190 lb)   amw 12/17   Mammogram 2/21 Self breast exam -no lumps   Gets a dull ache in pelvis occasionally  Can be either side Not bloating    Colonoscopy 11/21 -was ok (bleeding hemorrhoids)  No issues with bms other than that   Flu shot-declines covid vaccine-declines  Zoster status -not interested in vaccine    dexa 2/21-improved osteopenia  Fosamax Falls-none Fractures -none  Supplements-not on D   H/o a fib -anticoag  BP Readings from Last 3 Encounters:  03/11/20 124/78  03/08/20 (!) 150/88  03/05/20 (!) 173/82   Pulse Readings from Last 3 Encounters:  03/11/20 81  03/08/20 75  03/05/20 81  pacemaker is helping her energy level   Wishes she could go back to water exercise   She does play some games for memory and cognition  More issues since her stroke  Continues to improve    Rectal bleeding from hemorrhoids lately -eliquis (makes her bleed)   Lab Results  Component Value Date   WBC 4.0 03/04/2020   HGB 12.4 03/04/2020   HCT 37.2 03/04/2020   MCV 84.3 03/04/2020   PLT 168.0 03/04/2020    Hypothyroidism  Pt has no clinical changes No change in energy level/ hair or skin/ edema and no tremor Lab Results  Component Value Date   TSH  7.77 (H) 03/04/2020     Taking 25 mcg levothy daily   Hyperlipidemia Lab Results  Component Value Date   CHOL 178 03/04/2020   CHOL 218 (H) 06/17/2019   CHOL 198 09/08/2018   Lab Results  Component Value Date   HDL 37.80 (L) 03/04/2020   HDL 28 (L) 06/17/2019   HDL 38.90 (L) 09/08/2018   Lab Results  Component Value Date   LDLCALC 125 (H) 03/04/2020   LDLCALC 169 (H) 06/17/2019   LDLCALC 139 (H) 09/08/2018   Lab Results  Component Value Date   TRIG 76.0 03/04/2020   TRIG 104 06/17/2019   TRIG 97.0 09/08/2018   Lab Results  Component Value Date   CHOLHDL 5 03/04/2020   CHOLHDL 7.8 06/17/2019   CHOLHDL 5 09/08/2018   Lab Results  Component Value Date   LDLDIRECT 203.8 01/09/2008  allergic to statins  Takes zetia   Elevated glucose  Lab Results  Component Value Date   HGBA1C 5.6 03/04/2020    Patient Active Problem List   Diagnosis Date Noted  . Pelvic pain 03/11/2020  . Routine general medical examination at a health care facility 03/11/2020  . Elevated glucose 03/03/2020  . Rectal bleeding 02/18/2020  . History of TIA (transient ischemic attack) 02/18/2020  . S/P AV nodal ablation 07/28/19 07/29/2019  .  S/P placement of cardiac pacemaker MDT 07/28/19 07/29/2019  . AV block 07/28/2019  . CVA (cerebral vascular accident) ([place]) 06/16/2019  . Facial tingling 11/29/2018  . Tremor of left hand 11/29/2018  . Medicare annual wellness visit, subsequent 11/24/2018  . Dysuria 02/06/2018  . Iron deficiency anemia 10/21/2017  . Rapid atrial fibrillation ([place]) 08/19/2017  . Constipation 08/02/2017  . Numbness and tingling 07/14/2017  . Paresthesia 07/14/2017  . Unilateral primary osteoarthritis, left hip 05/04/2017  . Status post total replacement of left hip 05/04/2017  . Hip osteoarthritis 04/27/2017  . Long term (current) use of anticoagulants 03/04/2017  . Venous stasis dermatitis of both lower extremities 01/08/2017  . Impacted cerumen of right ear 11/20/2016   . Osteopenia 10/25/2016  . Pedal edema 08/26/2016  . Varicose veins of both lower extremities 08/26/2016  . Estrogen deficiency 08/26/2016  . Screening mammogram, encounter for 08/26/2016  . Hemorrhoids 08/26/2016  . History of nonmelanoma skin cancer 01/01/2016  . Hip pain 08/02/2014  . Left knee pain 08/02/2014  . Chronic cough 05/08/2014  . Hematochezia 04/09/2014  . Caregiver stress 08/16/2013  . Colon cancer screening 08/16/2013  . Encounter for therapeutic drug monitoring 04/20/2013  . Left ovarian cyst 03/14/2013  . (HFpEF) heart failure with preserved ejection fraction ([place]) 12/27/2012  . Palpitations 04/15/2012  . Cardiomyopathy, secondary --Resolved again 10/14 10/13/2010  . COLONIC POLYPS, ADENOMATOUS, HX OF 09/18/2009  . PULMONARY NODULE 12/20/2008  . GANGLION CYST 10/04/2007  . Hyperlipidemia 04/27/2007  . Depression with anxiety 04/27/2007  . Asthma, mild intermittent 04/27/2007  . INSOMNIA 04/27/2007  . ADENOMATOUS COLONIC POLYP 11/04/2006  . Hypothyroidism 09/02/2006  . Atrial fibrillation, chronic ([place]) 08/05/2006   Past Medical History:  Diagnosis Date  . Allergic rhinitis   . 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 Arthritis   . Atrial fibrillation -persistent cardiologist-  dr klein/  primary EP -- dr Tawanna Sat ([facility])   a. s/p PVI [name] 2010;  b. on tikosyn/coumadin;  c. 05/2009 Echo: EF 60-65%, Gr 2 DD. (first dx 09/ 2007)  . Bilateral lower extremity edema   . Bleeding hemorrhoid   . Carotid stenosis    mild (hosp 3/11)- consult by vasc/ Dr Donnetta Hutching  . Complication of anesthesia    hard to wake  . Diverticulosis of colon   . Dyspnea    on exertion-climbing stairs  . Fatty liver   . H/O cardiac radiofrequency ablation    01/ 2008 at [facility] /  03/ 2010  at [facility]  . Heart failure with preserved ejection fraction ([place])   . History of adenomatous polyp of colon    tubular adenoma's  . History of cardiomyopathy    secondary  tachycardia-induced cardiomyopathy -- resolved 2014  . History of squamous cell carcinoma in situ (SCCIS) of skin    05/ 2017  nasal bridge and right medial knee  . History of transient ischemic attack (TIA)    01-24-2005 and 06-12-2009  . Hyperlipidemia   . Hypothyroidism   . Mild intermittent asthma    reacts to cats  . Mixed stress and urge urinary incontinence   . Pulmonary nodule   . S/P AV nodal ablation 07/28/19 07/29/2019  . S/P mitral valve repair 10-23-1998  dr Boyce Medici at [facility]   for MVP and regurg. (annuloplasty ring procedure)  . S/P placement of cardiac pacemaker MDT 07/28/19 07/29/2019   Past Surgical History:  Procedure Laterality Date  . APPENDECTOMY  1978  . AV NODE  ABLATION N/A 07/28/2019   Procedure: AV NODE ABLATION;  Surgeon: Deboraha Sprang, MD;  Location: [place] [facility];  Service: Cardiovascular;  Laterality: N/A;  . BUBBLE STUDY  06/19/2019   Procedure: BUBBLE STUDY;  Surgeon: Pixie Casino, MD;  Location: [facility] ENDOSCOPY;  Service: Cardiovascular;;  . CARDIAC ELECTROPHYSIOLOGY [place]  01/ 2008    at [facility]   right-sided ablation atrial flutter  . CARDIAC ELECTROPHYSIOLOGY STUDY AND ABLATION  03/ 2010   dr Jaymes Graff at [facility]   AV node ablation and pulmonary vein isolation for atrial fib  . CARDIOVERSION  06-18-2006;  07-13-2006;  10-19-2010;  10-27-2010  . COLONOSCOPY    . COLONOSCOPY WITH PROPOFOL N/A 10/13/2017   Procedure: COLONOSCOPY WITH PROPOFOL;  Surgeon: [name], MD;  Location: [facility] ENDOSCOPY;  Service: Gastroenterology;  Laterality: N/A;  . COLONOSCOPY WITH PROPOFOL N/A 02/20/2020   Procedure: COLONOSCOPY WITH PROPOFOL;  Surgeon: Lesly Rubenstein, MD;  Location: [facility] ENDOSCOPY;  Service: Endoscopy;  Laterality: N/A;  . CYSTO/ TRANSURETHRAL COLLAGEN INJECTION THERAPY  07-26-2007   dr Matilde Sprang  . DILATION AND CURETTAGE OF UTERUS    . ESOPHAGOGASTRODUODENOSCOPY (EGD) WITH PROPOFOL N/A 10/13/2017   Procedure:  ESOPHAGOGASTRODUODENOSCOPY (EGD) WITH PROPOFOL;  Surgeon: [name], MD;  Location: [facility] ENDOSCOPY;  Service: Gastroenterology;  Laterality: N/A;  . EXCISIONAL HEMORRHOIDECTOMY  1980s  . GIVENS CAPSULE STUDY N/A 12/08/2017   Procedure: GIVENS CAPSULE STUDY;  Surgeon: [name], MD;  Location: [facility] ENDOSCOPY;  Service: Gastroenterology;  Laterality: N/A;  . HEMORRHOID SURGERY N/A 10/29/2016   Procedure: HEMORRHOIDECTOMY;  Surgeon: Leighton Ruff, MD;  Location: [facility];  Service: General;  Laterality: N/A;  . MITRAL VALVE ANNULOPLASTY  10/23/1998   "Model [id number]; Campbell Lerner [id number]"; size 51mm; [facility]; Dr. Boyce Medici  . PACEMAKER IMPLANT N/A 07/28/2019   Procedure: PACEMAKER IMPLANT;  Surgeon: Deboraha Sprang, MD;  Location: [place] [facility];  Service: Cardiovascular;  Laterality: N/A;  . [place]  . TEE WITH CARDIOVERSION  05-06-2006 at [facility];  01-02-2013 at [facility]  . TEE WITHOUT CARDIOVERSION N/A 06/19/2019   Procedure: TRANSESOPHAGEAL ECHOCARDIOGRAM (TEE);  Surgeon: Pixie Casino, MD;  Location: [facility] ENDOSCOPY;  Service: Cardiovascular;  Laterality: N/A;  . TOTAL HIP ARTHROPLASTY Left 05/04/2017   Procedure: LEFT TOTAL HIP ARTHROPLASTY ANTERIOR APPROACH;  Surgeon: Mcarthur Rossetti, MD;  Location: [facility];  Service: Orthopedics;  Laterality: Left;  . TRANSTHORACIC ECHOCARDIOGRAM  05-01-2015   dr Caryl Comes   ef 50-55%/  mild AV sclerosis without stenosis/  post MV repair with mild central MR (valve area by pressure half-time 2cm^2,  valve area by continutity equation 0.91cm^2, peak grandiant 35mmHg)/  severe LAE/ mild TR/ mild RAE   . TUBAL LIGATION Bilateral 1978   Social History   Tobacco Use  . Smoking status: Never Smoker  . Smokeless tobacco: Never Used  Vaping Use  . Vaping Use: Never used  Substance Use Topics  . Alcohol use: Yes    Alcohol/week: 0.0 standard drinks    Comment: seldom  . Drug use: No   Family History  Problem Relation Age  of Onset  . Lung cancer Father        smoker, died at 41  . Alcohol abuse Father   . Cancer Father        bladder and lung CA smoker  . Breast cancer Neg Hx   . Stroke Neg Hx    Allergies  Allergen Reactions  .  Amiodarone Hcl Swelling    SWELLING REACTION UNSPECIFIED   . Penicillins Rash    Has patient had a PCN reaction causing immediate rash, facial/tongue/throat swelling, SOB or lightheadedness with hypotension: No Has patient had a PCN reaction causing severe rash involving mucus membranes or skin necrosis: No Has patient had a PCN reaction that required hospitalization:Patient was inpatient when reaction occurred Has patient had a PCN reaction occurring within the last 10 years: No If all of the above answers are "NO", then may proceed with Cephalosporin use.   . Statins Rash   Current Outpatient Medications on File Prior to Visit  Medication Sig Dispense Refill  . acetaminophen (TYLENOL) 325 MG tablet Take 325 mg by mouth every 6 (six) hours as needed for moderate pain or headache.    . albuterol (PROVENTIL HFA;VENTOLIN HFA) 108 (90 Base) MCG/ACT inhaler Inhale 2 puffs into the lungs every 4 (four) hours as needed for wheezing or shortness of breath. 1 Inhaler 0  . diclofenac Sodium (VOLTAREN) 1 % GEL Apply 1 application topically 4 (four) times daily as needed (pain).    [name] diphenhydrAMINE (BENADRYL) 25 mg capsule Take 50 mg by mouth every 6 (six) hours as needed for itching.    . diphenhydrAMINE-zinc acetate (BENADRYL) cream Apply 1 application topically 3 (three) times daily as needed for itching.    [name] ELIQUIS 5 MG TABS tablet TAKE 1 TABLET TWICE DAILY 180 tablet 2  . ezetimibe (ZETIA) 10 MG tablet Take 1 tablet (10 mg total) by mouth daily. 90 tablet 3  . ferrous sulfate 325 (65 FE) MG EC tablet TAKE 1 TABLET EVERY DAY WITH BREAKFAST 90 tablet 3  . fluticasone (FLONASE) 50 MCG/ACT nasal spray Place 1 spray into both nostrils daily as needed for allergies.    . furosemide  (LASIX) 40 MG tablet Take 40 mg by mouth daily as needed for edema.    [name] levothyroxine (SYNTHROID) 25 MCG tablet TAKE 1 TABLET EVERY DAY BEFORE BREAKFAST 90 tablet 3  . loratadine (CLARITIN) 10 MG tablet Take 10 mg by mouth daily as needed for allergies.     Vladimir Faster Glycol-Propyl Glycol 0.4-0.3 % SOLN Place 1-2 drops into both eyes 3 (three) times daily as needed (for dry eyes.).     No current facility-administered medications on file prior to visit.     Review of Systems  Constitutional: Negative for activity change, appetite change, fatigue, fever and unexpected weight change.  HENT: Negative for congestion, ear pain, rhinorrhea, sinus pressure and sore throat.   Eyes: Negative for pain, redness and visual disturbance.  Respiratory: Negative for cough, shortness of breath and wheezing.   Cardiovascular: Negative for chest pain and palpitations.  Gastrointestinal: Negative for abdominal pain, blood in stool, constipation and diarrhea.       Hemorrhoids -sometimes bleed  Endocrine: Negative for polydipsia and polyuria.  Genitourinary: Negative for dysuria, frequency and urgency.       Some vague pelvic pain  Musculoskeletal: Positive for arthralgias and back pain. Negative for myalgias.  Skin: Negative for pallor and rash.  Allergic/Immunologic: Negative for environmental allergies.  Neurological: Negative for dizziness, syncope and headaches.  Hematological: Negative for adenopathy. Does not bruise/bleed easily.  Psychiatric/Behavioral: Negative for decreased concentration and dysphoric mood. The patient is not nervous/anxious.        Objective:   Physical Exam Constitutional:      General: She is not in acute distress.    Appearance: Normal appearance. She is well-developed and normal weight.  She is not ill-appearing or diaphoretic.  HENT:     Head: Normocephalic and atraumatic.     Right Ear: Tympanic membrane, ear canal and external ear normal.     Left Ear: Tympanic  membrane, ear canal and external ear normal.     Nose: Nose normal. No congestion.     Mouth/Throat:     Mouth: Mucous membranes are moist.     Pharynx: Oropharynx is clear. No posterior oropharyngeal erythema.  Eyes:     General: No scleral icterus.    Extraocular Movements: Extraocular movements intact.     Conjunctiva/sclera: Conjunctivae normal.     Pupils: Pupils are equal, round, and reactive to light.  Neck:     Thyroid: No thyromegaly.     Vascular: No carotid bruit or JVD.  Cardiovascular:     Rate and Rhythm: Normal rate and regular rhythm.     Pulses: Normal pulses.     Heart sounds: Normal heart sounds. No gallop.   Pulmonary:     Effort: Pulmonary effort is normal. No respiratory distress.     Breath sounds: Normal breath sounds. No wheezing.     Comments: Good air exch Chest:     Chest wall: No tenderness.  Abdominal:     General: Bowel sounds are normal. There is no distension or abdominal bruit.     Palpations: Abdomen is soft. There is no mass.     Tenderness: There is no abdominal tenderness.     Hernia: No hernia is present.  Genitourinary:    Comments: Breast exam: No mass, nodules, thickening, tenderness, bulging, retraction, inflamation, nipple discharge or skin changes noted.  No axillary or clavicular LA.     Musculoskeletal:        General: No tenderness. Normal range of motion.     Cervical back: Normal range of motion and neck supple. No rigidity. No muscular tenderness.     Right lower leg: No edema.     Left lower leg: No edema.     Comments: No kyphosis   Lymphadenopathy:     Cervical: No cervical adenopathy.  Skin:    General: Skin is warm and dry.     Coloration: Skin is not pale.     Findings: No erythema or rash.     Comments: Solar lentigines diffusely and sks   Neurological:     Mental Status: She is alert. Mental status is at baseline.     Cranial Nerves: No cranial nerve deficit.     Motor: No abnormal muscle tone.      Coordination: Coordination normal.     Gait: Gait normal.     Deep Tendon Reflexes: Reflexes are normal and symmetric. Reflexes normal.  Psychiatric:        Mood and Affect: Mood normal.        Cognition and Memory: Cognition and memory normal.           Assessment & Plan:   Problem List Items Addressed This Visit      Digestive   Rectal bleeding    Hemorrhoids have been an issue in setting of eliquis Has appt to f/u with surgeon re: treatment        Endocrine   Hypothyroidism    Lab Results  Component Value Date   TSH 7.77 (H) 03/04/2020   Plan to inc levothy to 50 mcg daily and re check 6 wk  Not much clinical change       Relevant Medications  levothyroxine (SYNTHROID) 50 MCG tablet     Musculoskeletal and Integument   Osteopenia    Fosamax since 8/18 No problems  dexa 2/21 reviewed No falls or fx Enc strongly to start vit D3 2000 iu daily          Other   Hyperlipidemia    Disc goals for lipids and reasons to control them Rev last labs with pt Rev low sat fat diet in detail Statin allergic  Taking zetia  LDL down to 125        Iron deficiency anemia    Hemorrhoidal bleeding  Nl cbc on iron currently      Elevated glucose    Lab Results  Component Value Date   HGBA1C 5.6 03/04/2020   disc imp of low glycemic diet and wt loss to prevent DM2       Pelvic pain    Vague  Will f/u for gyn /pelvic exam      Routine general medical examination at a health care facility - Primary    Reviewed health habits including diet and exercise and skin cancer prevention Reviewed appropriate screening tests for age  Also reviewed health mt list, fam hx and immunization status , as well as social and family history   See HPI Labs reviewed  Enc to get back to exercise  Will f/u for pelvic exam for pelvic discomfort Declines flu , covid and shigrix vaccines  Offered to answer questions  No falls or fx , on fosamax for osteopenia

## 2020-03-11 NOTE — Assessment & Plan Note (Signed)
Fosamax since 8/18 No problems  dexa 2/21 reviewed No falls or fx Enc strongly to start vit D3 2000 iu daily

## 2020-03-11 NOTE — Assessment & Plan Note (Signed)
Lab Results  Component Value Date   TSH 7.77 (H) 03/04/2020   Plan to inc levothy to 50 mcg daily and re check 6 wk  Not much clinical change

## 2020-03-11 NOTE — Assessment & Plan Note (Signed)
Hemorrhoids have been an issue in setting of eliquis Has appt to f/u with surgeon re: treatment

## 2020-03-11 NOTE — Assessment & Plan Note (Signed)
Lab Results  Component Value Date   HGBA1C 5.6 03/04/2020   disc imp of low glycemic diet and wt loss to prevent DM2

## 2020-03-11 NOTE — Assessment & Plan Note (Signed)
Disc goals for lipids and reasons to control them Rev last labs with pt Rev low sat fat diet in detail Statin allergic  Taking zetia  LDL down to 125

## 2020-03-11 NOTE — Assessment & Plan Note (Signed)
Hemorrhoidal bleeding  Nl cbc on iron currently

## 2020-03-11 NOTE — Assessment & Plan Note (Signed)
Vague  Will f/u for gyn /pelvic exam

## 2020-03-12 ENCOUNTER — Telehealth: Payer: Self-pay | Admitting: Gastroenterology

## 2020-03-12 NOTE — Telephone Encounter (Signed)
Patient [name] that she needs hemorrhoid surgery. The doctor initially referred to is not taking new patients. Please call her, as she has another doctor to send a new referral to.

## 2020-03-12 NOTE — Telephone Encounter (Signed)
Patient contacted office stated to disregard previous message in regards to her referral.  She has her appt information.  Thanks,  [place], [place]

## 2020-03-26 ENCOUNTER — Telehealth: Payer: Self-pay | Admitting: *Deleted

## 2020-03-26 NOTE — Telephone Encounter (Signed)
Patient called stating that she just found out that she was exposed to covid at church Sunday. 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 Patient stated that she feels a little tired today and a little queasy in the back of her throat. Patient denies any other covid symptoms. Patient stated that she has called around several places and can not get a covid test done today and wants to know where she can go get tested. Patient was given information on [facility] on [facility]. Patient stated that she will head over to [facility] to try and get a test today. Patient was advised that she should quarantine until she gets her test results back. Patient was given ER precautions and she verbalized understanding. Advised patient if she develops any symptoms and feels that she needs a virtual visit to call the office back. Patient was advised to call the office back with her test results.

## 2020-03-26 NOTE — Telephone Encounter (Signed)
Agree with advisement

## 2020-03-27 ENCOUNTER — Ambulatory Visit: Payer: Medicare Other | Admitting: Surgery

## 2020-03-29 ENCOUNTER — Other Ambulatory Visit: Payer: Self-pay | Admitting: Family Medicine

## 2020-03-29 DIAGNOSIS — Z1231 Encounter for screening mammogram for malignant neoplasm of breast: Secondary | ICD-10-CM

## 2020-04-03 ENCOUNTER — Ambulatory Visit (INDEPENDENT_AMBULATORY_CARE_PROVIDER_SITE_OTHER): Payer: Medicare Other | Admitting: Surgery

## 2020-04-03 ENCOUNTER — Telehealth: Payer: Self-pay | Admitting: Surgery

## 2020-04-03 ENCOUNTER — Encounter: Payer: Self-pay | Admitting: Surgery

## 2020-04-03 ENCOUNTER — Other Ambulatory Visit: Payer: Self-pay

## 2020-04-03 VITALS — BP 173/78 | HR 80 | Temp 98.1°F | Ht 64.0 in | Wt 161.8 lb

## 2020-04-03 DIAGNOSIS — K642 Third degree hemorrhoids: Secondary | ICD-10-CM

## 2020-04-03 NOTE — Patient Instructions (Addendum)
Our surgery scheduler will call you within 24-48 hours to schedule your surgery. Please have the [place] surgery sheet available when speaking with her.   Please STOP the Eliquis on Sunday 04/07/2020. We will let you know when to restart the Eliquis.    Hemorrhoids Hemorrhoids are swollen veins that may develop:  In the butt (rectum). These are called internal hemorrhoids.  Around the opening of the butt (anus). These are called external hemorrhoids. Hemorrhoids can cause pain, itching, or bleeding. Most of the time, they do not cause serious problems. They usually get better with diet changes, lifestyle changes, and other home treatments. What are the causes? This condition may be caused by:  Having trouble pooping (constipation).  Pushing hard (straining) to poop.  Watery poop (diarrhea).  Pregnancy.  Being very overweight (obese).  Sitting for long periods of time.  Heavy lifting or other activity that causes you to strain.  Anal sex.  Riding a bike for a long period of time. What are the signs or symptoms? Symptoms of this condition include:  Pain.  Itching or soreness in the butt.  Bleeding from the butt.  Leaking poop.  Swelling in the area.  One or more lumps around the opening of your butt. How is this diagnosed? A doctor can often diagnose this condition by looking at the affected area. The doctor may also:  Do an exam that involves feeling the area with a gloved hand (digital rectal exam).  Examine the area inside your butt using a small tube (anoscope).  Order blood tests. This may be done if you have lost a lot of blood.  Have you get a test that involves looking inside the colon using a flexible tube with a camera on the end (sigmoidoscopy or colonoscopy). How is this treated? This condition can usually be treated at home. Your doctor may tell you to change what you eat, make lifestyle changes, or try home treatments. If these do not help, procedures  can be done to remove the hemorrhoids or make them smaller. These may involve:  Placing rubber bands at the base of the hemorrhoids to cut off their blood supply.  Injecting medicine into the hemorrhoids to shrink them.  Shining a type of light energy onto the hemorrhoids to cause them to fall off.  Doing surgery to remove the hemorrhoids or cut off their blood supply. Follow these instructions at home: Eating and drinking  Eat foods that have a lot of fiber in them. These include whole grains, beans, nuts, fruits, and vegetables.  Ask your doctor about taking products that have added fiber (fibersupplements).  Reduce the amount of fat in your diet. You can do this by: ? Eating low-fat dairy products. ? Eating less red meat. ? Avoiding processed foods.  Drink enough fluid to keep your pee (urine) pale yellow.   Managing pain and swelling  Take a warm-water bath (sitz bath) for 20 minutes to ease pain. Do this 3-4 times a day. You may do this in a bathtub or using a portable sitz bath that fits over the toilet.  If told, put ice on the painful area. It may be helpful to use ice between your warm baths. ? Put ice in a plastic bag. ? Place a towel between your skin and the bag. ? Leave the ice on for 20 minutes, 2-3 times a day.   General instructions  Take over-the-counter and prescription medicines only as told by your doctor. ? Medicated creams and medicines  may be used as told.  Exercise often. Ask your doctor how much and what kind of exercise is best for you.  Go to the bathroom when you have the urge to poop. Do not wait.  Avoid pushing too hard when you poop.  Keep your butt dry and clean. Use wet toilet paper or moist towelettes after pooping.  Do not sit on the toilet for a long time.  Keep all follow-up visits as told by your doctor. This is important. Contact a doctor if you:  Have pain and swelling that do not get better with treatment or medicine.  Have  trouble pooping.  Cannot poop.  Have pain or swelling outside the area of the hemorrhoids. Get help right away if you have:  Bleeding that will not stop. Summary  Hemorrhoids are swollen veins in the butt or around the opening of the butt.  They can cause pain, itching, or bleeding.  Eat foods that have a lot of fiber in them. These include whole grains, beans, nuts, fruits, and vegetables.  Take a warm-water bath (sitz bath) for 20 minutes to ease pain. Do this 3-4 times a day. This information is not intended to replace advice given to you by your health care provider. Make sure you discuss any questions you have with your health care provider. Document Revised: 03/17/2018 Document Reviewed: 07/29/2017 Elsevier Patient Education  [place].

## 2020-04-03 NOTE — Telephone Encounter (Signed)
Patient has been advised of Pre-Admission date/time, COVID Testing date and Surgery date.  Surgery Date: 04/09/20 Preadmission Testing Date: 04/05/20 (phone 8a-1p) Covid Testing Date: 04/05/20 - patient advised to go to the [place] [facility] between 8a-1p  Patient has been made aware to call [phone], between 1-3:00pm the day before surgery, to find out what time to arrive for surgery.

## 2020-04-03 NOTE — H&P (View-Only) (Signed)
Patient ID: [name], female   DOB: 1932-06-07, 87 y.o.   MRN: [id number]  HPI [name] Key is a 87 y.o. female in consultation at the request of Dr. Vicente Males for hematochezia.  She has significant comorbidities including A. fib, history of mitral valvuloplasty.  She has had TIA and is currently on Eliquis.  She does have CHF Last year she had pacemaker implantation and AV node ablation by Dr. Caryl Comes at [place].  She also had 3 years ago hemorrhoidectomy on the right lateral aspect as well as an excision of left lateral rectal lesion performed by Dr. Marcello Moores.  She did well after that. She continues to have intermittent hematochezia.  She was recently hospitalized for that GI perform a colonoscopy.  I have personally reviewed the images.  There were benign polyps and internal hemorrhoids.  Patient denies any anorectal pain.  No fevers no chills. She is very independent and she is able to perform all her ADLs.  She is still drives.  She lives with her son who had head and neck cancer and actually she is the primary caregiver.  Last INR was 1.3 hemoglobin was 12.4 rest of the CBC was normal.  CMP was completely normal  HPI  Past Medical History:  Diagnosis Date  . Allergic rhinitis   . 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 Arthritis   . Atrial fibrillation -persistent cardiologist-  dr klein/  primary EP -- dr Tawanna Sat ([facility])   a. s/p PVI [name] 2010;  b. on tikosyn/coumadin;  c. 05/2009 Echo: EF 60-65%, Gr 2 DD. (first dx 09/ 2007)  . Bilateral lower extremity edema   . Bleeding hemorrhoid   . Carotid stenosis    mild (hosp 3/11)- consult by vasc/ Dr Donnetta Hutching  . Complication of anesthesia    hard to wake  . Diverticulosis of colon   . Dyspnea    on exertion-climbing stairs  . Fatty liver   . H/O cardiac radiofrequency ablation    01/ 2008 at [facility] /  03/ 2010  at [name]  . Heart failure with preserved ejection fraction ([place])   . History of adenomatous polyp of colon    tubular  adenoma's  . History of cardiomyopathy    secondary tachycardia-induced cardiomyopathy -- resolved 2014  . History of squamous cell carcinoma in situ (SCCIS) of skin    05/ 2017  nasal bridge and right medial knee  . History of transient ischemic attack (TIA)    01-24-2005 and 06-12-2009  . Hyperlipidemia   . Hypothyroidism   . Mild intermittent asthma    reacts to cats  . Mixed stress and urge urinary incontinence   . Pulmonary nodule   . S/P AV nodal ablation 07/28/19 07/29/2019  . S/P mitral valve repair 10-23-1998  dr Boyce Medici at [facility]   for MVP and regurg. (annuloplasty ring procedure)  . S/P placement of cardiac pacemaker MDT 07/28/19 07/29/2019    Past Surgical History:  Procedure Laterality Date  . APPENDECTOMY  1978  . AV NODE ABLATION N/A 07/28/2019   Procedure: AV NODE ABLATION;  Surgeon: Deboraha Sprang, MD;  Location: [place] CV LAB;  Service: Cardiovascular;  Laterality: N/A;  . BUBBLE STUDY  06/19/2019   Procedure: BUBBLE STUDY;  Surgeon: Pixie Casino, MD;  Location: [facility] ENDOSCOPY;  Service: Cardiovascular;;  . CARDIAC ELECTROPHYSIOLOGY [place]  01/ 2008    at [place]   right-sided ablation atrial flutter  .  CARDIAC ELECTROPHYSIOLOGY STUDY AND ABLATION  03/ 2010   dr Jaymes Graff at [facility]   AV node ablation and pulmonary vein isolation for atrial fib  . CARDIOVERSION  06-18-2006;  07-13-2006;  10-19-2010;  10-27-2010  . COLONOSCOPY    . COLONOSCOPY WITH PROPOFOL N/A 10/13/2017   Procedure: COLONOSCOPY WITH PROPOFOL;  Surgeon: [name], MD;  Location[facility] ENDOSCOPY;  Service: Gastroenterology;  Laterality: N/A;  . COLONOSCOPY WITH PROPOFOL N/A 02/20/2020   Procedure: COLONOSCOPY WITH PROPOFOL;  Surgeon: Lesly Rubenstein, MD;  Location: [facility] ENDOSCOPY;  Service: Endoscopy;  Laterality: N/A;  . CYSTO/ TRANSURETHRAL COLLAGEN INJECTION THERAPY  07-26-2007   dr Matilde Sprang  . DILATION AND CURETTAGE OF UTERUS    .  ESOPHAGOGASTRODUODENOSCOPY (EGD) WITH PROPOFOL N/A 10/13/2017   Procedure: ESOPHAGOGASTRODUODENOSCOPY (EGD) WITH PROPOFOL;  Surgeon: [name], MD;  Location: [facility]  ENDOSCOPY;  Service: Gastroenterology;  Laterality: N/A;  . EXCISIONAL HEMORRHOIDECTOMY  1980s  . GIVENS CAPSULE STUDY N/A 12/08/2017   Procedure: GIVENS CAPSULE STUDY;  Surgeon: [name], MD;  Location: [facility] ENDOSCOPY;  Service: Gastroenterology;  Laterality: N/A;  . HEMORRHOID SURGERY N/A 10/29/2016   Procedure: HEMORRHOIDECTOMY;  Surgeon: Leighton Ruff, MD;  Location: [facility];  Service: General;  Laterality: N/A;  . MITRAL VALVE ANNULOPLASTY  10/23/1998   "Model [id number]; [employer] [id number]"; size 30mm; [facility]; Dr. Boyce Medici  . PACEMAKER IMPLANT N/A 07/28/2019   Procedure: PACEMAKER IMPLANT;  Surgeon: Deboraha Sprang, MD;  Location: [place] CV LAB;  Service: Cardiovascular;  Laterality: N/A;  . [place]  . TEE WITH CARDIOVERSION  05-06-2006 at [facility];  01-02-2013 at [facility]  . TEE WITHOUT CARDIOVERSION N/A 06/19/2019   Procedure: TRANSESOPHAGEAL ECHOCARDIOGRAM (TEE);  Surgeon: Pixie Casino, MD;  Location: [facility] ENDOSCOPY;  Service: Cardiovascular;  Laterality: N/A;  . TOTAL HIP ARTHROPLASTY Left 05/04/2017   Procedure: LEFT TOTAL HIP ARTHROPLASTY ANTERIOR APPROACH;  Surgeon: Mcarthur Rossetti, MD;  Location: [place];  Service: Orthopedics;  Laterality: Left;  . TRANSTHORACIC ECHOCARDIOGRAM  05-01-2015   dr Caryl Comes   ef 50-55%/  mild AV sclerosis without stenosis/  post MV repair with mild central MR (valve area by pressure half-time 2cm^2,  valve area by continutity equation 0.91cm^2, peak grandiant 51mmHg)/  severe LAE/ mild TR/ mild RAE   . TUBAL LIGATION Bilateral 1978    Family History  Problem Relation Age of Onset  . Lung cancer Father        smoker, died at 29  . Alcohol abuse Father   . Cancer Father        bladder and lung CA smoker  . Breast cancer Neg Hx   . Stroke Neg Hx      Social History Social History   Tobacco Use  . Smoking status: Never Smoker  . Smokeless tobacco: Never Used  Vaping Use  . Vaping Use: Never used  Substance Use Topics  . Alcohol use: Yes    Alcohol/week: 0.0 standard drinks    Comment: seldom  . Drug use: No    Allergies  Allergen Reactions  . Amiodarone Hcl Swelling    SWELLING REACTION UNSPECIFIED   . Penicillins Rash    Has patient had a PCN reaction causing immediate rash, facial/tongue/throat swelling, SOB or lightheadedness with hypotension: No Has patient had a PCN reaction causing severe rash involving mucus membranes or skin necrosis: No Has patient had a PCN reaction that required hospitalization:Patient was inpatient when reaction occurred Has patient had a PCN reaction  occurring within the last 10 years: No If all of the above answers are "NO", then may proceed with Cephalosporin use.   . Statins Rash    Current Outpatient Medications  Medication Sig Dispense Refill  . acetaminophen (TYLENOL) 325 MG tablet Take 325 mg by mouth every 6 (six) hours as needed for moderate pain or headache.    . albuterol (PROVENTIL HFA;VENTOLIN HFA) 108 (90 Base) MCG/ACT inhaler Inhale 2 puffs into the lungs every 4 (four) hours as needed for wheezing or shortness of breath. 1 Inhaler 0  . alendronate (FOSAMAX) 70 MG tablet Take 1 tablet (70 mg total) by mouth every 7 (seven) days. Take with a full glass of water on an empty stomach. 1 tablet 0  . diclofenac Sodium (VOLTAREN) 1 % GEL Apply 1 application topically 4 (four) times daily as needed (pain).    [name] diphenhydrAMINE (BENADRYL) 25 mg capsule Take 50 mg by mouth every 6 (six) hours as needed for itching.    . diphenhydrAMINE-zinc acetate (BENADRYL) cream Apply 1 application topically 3 (three) times daily as needed for itching.    [name] ELIQUIS 5 MG TABS tablet TAKE 1 TABLET TWICE DAILY 180 tablet 2  . ezetimibe (ZETIA) 10 MG tablet Take 1 tablet (10 mg total) by mouth daily. 90  tablet 3  . ferrous sulfate 325 (65 FE) MG EC tablet TAKE 1 TABLET EVERY DAY WITH BREAKFAST 90 tablet 3  . fluticasone (FLONASE) 50 MCG/ACT nasal spray Place 1 spray into both nostrils daily as needed for allergies.    . furosemide (LASIX) 40 MG tablet Take 40 mg by mouth daily as needed for edema.    [name] levothyroxine (SYNTHROID) 25 MCG tablet TAKE 1 TABLET EVERY DAY BEFORE BREAKFAST 90 tablet 3  . levothyroxine (SYNTHROID) 50 MCG tablet Take 1 tablet (50 mcg total) by mouth daily. 90 tablet 3  . loratadine (CLARITIN) 10 MG tablet Take 10 mg by mouth daily as needed for allergies.     Vladimir Faster Glycol-Propyl Glycol 0.4-0.3 % SOLN Place 1-2 drops into both eyes 3 (three) times daily as needed (for dry eyes.).     No current facility-administered medications for this visit.     Review of Systems Full ROS  was asked and was negative except for the information on the HPI  Physical Exam Blood pressure (!) 173/78, pulse 80, temperature 98.1 F (36.7 C), temperature source Oral, height 5\' 4"  (1.626 m), weight 161 lb 12.8 oz (73.4 kg), SpO2 96 %. CONSTITUTIONAL: NAD EYES: Pupils are equal, round,  Sclera are non-icteric. EARS, NOSE, MOUTH AND THROAT: She is wearing a mask. Hearing is intact to voice. LYMPH NODES:  Lymph nodes in the neck are normal. RESPIRATORY:  Lungs are clear. There is normal respiratory effort, with equal breath sounds bilaterally, and without pathologic use of accessory muscles. CARDIOVASCULAR: Heart is regular without murmurs, gallops, or rubs. GI: The abdomen is  soft, nontender, and nondistended. There are no palpable masses. There is no hepatosplenomegaly. There are normal bowel sounds in all quadrants. Rectal; normal tone.  There is an obvious ulcerated grade 3 left lateral internal hemorrhoid.  Exam is nontender.  There is no evidence of any masses MUSCULOSKELETAL: Normal muscle strength and tone. No cyanosis or edema.   SKIN: Turgor is good and there are no  pathologic skin lesions or ulcers. NEUROLOGIC: Motor and sensation is grossly normal. Cranial nerves are grossly intact. PSYCH:  Oriented to person, place and time. Affect is normal.  Data  Reviewed  I have personally reviewed the patient's imaging, laboratory findings and medical records.    Assessment/Plan 87 year old female with multiple comorbidities now with persistent hematochezia related to internal hemorrhoid.  Patient is on Eliquis and given all her comorbidities coming the off anticoagulants. Given all her history I do think there is a clear indication for hemorrhoidectomy.  I have discussed with the patient in detail about my thought process and recommendations.  Procedure discussed with patient in detail.  Risks, benefits and possible implications including but not limited to: Bleeding, infection, recurrence chronic pain.  She understands and wished to proceed  Caroleen Hamman, MD FACS General Surgeon 04/03/2020, 2:40 PM

## 2020-04-03 NOTE — Progress Notes (Signed)
Patient ID: [name], female   DOB: 1932-06-07, 87 y.o.   MRN: [id number]  HPI [name] is a 87 y.o. female in consultation at the request of Dr. Vicente Males for hematochezia.  She has significant comorbidities including A. fib, history of mitral valvuloplasty.  She has had TIA and is currently on Eliquis.  She does have CHF Last year she had pacemaker implantation and AV node ablation by Dr. Caryl Comes at [place].  She also had 3 years ago hemorrhoidectomy on the right lateral aspect as well as an excision of left lateral rectal lesion performed by Dr. Marcello Moores.  She did well after that. She continues to have intermittent hematochezia.  She was recently hospitalized for that GI perform a colonoscopy.  I have personally reviewed the images.  There were benign polyps and internal hemorrhoids.  Patient denies any anorectal pain.  No fevers no chills. She is very independent and she is able to perform all her ADLs.  She is still drives.  She lives with her son who had head and neck cancer and actually she is the primary caregiver.  Last INR was 1.3 hemoglobin was 12.4 rest of the CBC was normal.  CMP was completely normal  HPI  Past Medical History:  Diagnosis Date  . Allergic rhinitis   . 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 Arthritis   . Atrial fibrillation -persistent cardiologist-  dr klein/  primary EP -- [facility] Tawanna Sat ([facility])   a. s/p PVI [name] 2010;  b. on tikosyn/coumadin;  c. 05/2009 Echo: EF 60-65%, Gr 2 DD. (first dx 09/ 2007)  . Bilateral lower extremity edema   . Bleeding hemorrhoid   . Carotid stenosis    mild (hosp 3/11)- consult by vasc/ Dr Donnetta Hutching  . Complication of anesthesia    hard to wake  . Diverticulosis of colon   . Dyspnea    on exertion-climbing stairs  . Fatty liver   . H/O cardiac radiofrequency ablation    01/ 2008 at [place] [facility] [place] /  03/ 2010  at [name]  . Heart failure with preserved ejection fraction ([place])   . History of adenomatous polyp of colon    tubular  adenoma's  . History of cardiomyopathy    secondary tachycardia-induced cardiomyopathy -- resolved 2014  . History of squamous cell carcinoma in situ (SCCIS) of skin    05/ 2017  nasal bridge and right medial knee  . History of transient ischemic attack (TIA)    01-24-2005 and 06-12-2009  . Hyperlipidemia   . Hypothyroidism   . Mild intermittent asthma    reacts to cats  . Mixed stress and urge urinary incontinence   . Pulmonary nodule   . S/P AV nodal ablation 07/28/19 07/29/2019  . S/P mitral valve repair 10-23-1998  dr Boyce Medici at [facility]   for MVP and regurg. (annuloplasty ring procedure)  . S/P placement of cardiac pacemaker MDT 07/28/19 07/29/2019    Past Surgical History:  Procedure Laterality Date  . APPENDECTOMY  1978  . AV NODE ABLATION N/A 07/28/2019   Procedure: AV NODE ABLATION;  Surgeon: Deboraha Sprang, MD;  Location: [place] CV LAB;  Service: Cardiovascular;  Laterality: N/A;  . BUBBLE STUDY  06/19/2019   Procedure: BUBBLE STUDY;  Surgeon: Pixie Casino, MD;  Location: [facility] ENDOSCOPY;  Service: Cardiovascular;;  . CARDIAC ELECTROPHYSIOLOGY [place]  01/ 2008    at [place]   right-sided ablation atrial flutter  .  CARDIAC ELECTROPHYSIOLOGY STUDY AND ABLATION  03/ 2010   dr Jaymes Graff at [facility]   AV node ablation and pulmonary vein isolation for atrial fib  . CARDIOVERSION  06-18-2006;  07-13-2006;  10-19-2010;  10-27-2010  . COLONOSCOPY    . COLONOSCOPY WITH PROPOFOL N/A 10/13/2017   Procedure: COLONOSCOPY WITH PROPOFOL;  Surgeon: [name], MD;  Location: [facility] ENDOSCOPY;  Service: Gastroenterology;  Laterality: N/A;  . COLONOSCOPY WITH PROPOFOL N/A 02/20/2020   Procedure: COLONOSCOPY WITH PROPOFOL;  Surgeon: Lesly Rubenstein, MD;  Location: [facility] ENDOSCOPY;  Service: Endoscopy;  Laterality: N/A;  . CYSTO/ TRANSURETHRAL COLLAGEN INJECTION THERAPY  07-26-2007   dr Matilde Sprang  . DILATION AND CURETTAGE OF UTERUS    .  ESOPHAGOGASTRODUODENOSCOPY (EGD) WITH PROPOFOL N/A 10/13/2017   Procedure: ESOPHAGOGASTRODUODENOSCOPY (EGD) WITH PROPOFOL;  Surgeon: [name], MD;  Location: [facility]  ENDOSCOPY;  Service: Gastroenterology;  Laterality: N/A;  . EXCISIONAL HEMORRHOIDECTOMY  1980s  . GIVENS CAPSULE STUDY N/A 12/08/2017   Procedure: GIVENS CAPSULE STUDY;  Surgeon: [name], MD;  Location: [facility] ENDOSCOPY;  Service: Gastroenterology;  Laterality: N/A;  . HEMORRHOID SURGERY N/A 10/29/2016   Procedure: HEMORRHOIDECTOMY;  Surgeon: Leighton Ruff, MD;  Location: [facility];  Service: General;  Laterality: N/A;  . MITRAL VALVE ANNULOPLASTY  10/23/1998   "Model 4625; Campbell Lerner [id number]"; size 30mm; [facility]; Dr. Boyce Medici  . PACEMAKER IMPLANT N/A 07/28/2019   Procedure: PACEMAKER IMPLANT;  Surgeon: Deboraha Sprang, MD;  Location: [place] CV LAB;  Service: Cardiovascular;  Laterality: N/A;  . [place]  . TEE WITH CARDIOVERSION  05-06-2006 at [facility];  01-02-2013 at [facility]  . TEE WITHOUT CARDIOVERSION N/A 06/19/2019   Procedure: TRANSESOPHAGEAL ECHOCARDIOGRAM (TEE);  Surgeon: Pixie Casino, MD;  Location: [facility] ENDOSCOPY;  Service: Cardiovascular;  Laterality: N/A;  . TOTAL HIP ARTHROPLASTY Left 05/04/2017   Procedure: LEFT TOTAL HIP ARTHROPLASTY ANTERIOR APPROACH;  Surgeon: Mcarthur Rossetti, MD;  Location: [place];  Service: Orthopedics;  Laterality: Left;  . TRANSTHORACIC ECHOCARDIOGRAM  05-01-2015   dr Caryl Comes   ef 50-55%/  mild AV sclerosis without stenosis/  post MV repair with mild central MR (valve area by pressure half-time 2cm^2,  valve area by continutity equation 0.91cm^2, peak grandiant 51mmHg)/  severe LAE/ mild TR/ mild RAE   . TUBAL LIGATION Bilateral 1978    Family History  Problem Relation Age of Onset  . Lung cancer Father        smoker, died at 29  . Alcohol abuse Father   . Cancer Father        bladder and lung CA smoker  . Breast cancer Neg Hx   . Stroke Neg Hx      Social History Social History   Tobacco Use  . Smoking status: Never Smoker  . Smokeless tobacco: Never Used  Vaping Use  . Vaping Use: Never used  Substance Use Topics  . Alcohol use: Yes    Alcohol/week: 0.0 standard drinks    Comment: seldom  . Drug use: No    Allergies  Allergen Reactions  . Amiodarone Hcl Swelling    SWELLING REACTION UNSPECIFIED   . Penicillins Rash    Has patient had a PCN reaction causing immediate rash, facial/tongue/throat swelling, SOB or lightheadedness with hypotension: No Has patient had a PCN reaction causing severe rash involving mucus membranes or skin necrosis: No Has patient had a PCN reaction that required hospitalization:Patient was inpatient when reaction occurred Has patient had a PCN reaction  occurring within the last 10 years: No If all of the above answers are "NO", then may proceed with Cephalosporin use.   . Statins Rash    Current Outpatient Medications  Medication Sig Dispense Refill  . acetaminophen (TYLENOL) 325 MG tablet Take 325 mg by mouth every 6 (six) hours as needed for moderate pain or headache.    . albuterol (PROVENTIL HFA;VENTOLIN HFA) 108 (90 Base) MCG/ACT inhaler Inhale 2 puffs into the lungs every 4 (four) hours as needed for wheezing or shortness of breath. 1 Inhaler 0  . alendronate (FOSAMAX) 70 MG tablet Take 1 tablet (70 mg total) by mouth every 7 (seven) days. Take with a full glass of water on an empty stomach. 1 tablet 0  . diclofenac Sodium (VOLTAREN) 1 % GEL Apply 1 application topically 4 (four) times daily as needed (pain).    [name] diphenhydrAMINE (BENADRYL) 25 mg capsule Take 50 mg by mouth every 6 (six) hours as needed for itching.    . diphenhydrAMINE-zinc acetate (BENADRYL) cream Apply 1 application topically 3 (three) times daily as needed for itching.    [name] ELIQUIS 5 MG TABS tablet TAKE 1 TABLET TWICE DAILY 180 tablet 2  . ezetimibe (ZETIA) 10 MG tablet Take 1 tablet (10 mg total) by mouth daily. 90  tablet 3  . ferrous sulfate 325 (65 FE) MG EC tablet TAKE 1 TABLET EVERY DAY WITH BREAKFAST 90 tablet 3  . fluticasone (FLONASE) 50 MCG/ACT nasal spray Place 1 spray into both nostrils daily as needed for allergies.    . furosemide (LASIX) 40 MG tablet Take 40 mg by mouth daily as needed for edema.    [name] levothyroxine (SYNTHROID) 25 MCG tablet TAKE 1 TABLET EVERY DAY BEFORE BREAKFAST 90 tablet 3  . levothyroxine (SYNTHROID) 50 MCG tablet Take 1 tablet (50 mcg total) by mouth daily. 90 tablet 3  . loratadine (CLARITIN) 10 MG tablet Take 10 mg by mouth daily as needed for allergies.     Vladimir Faster Glycol-Propyl Glycol 0.4-0.3 % SOLN Place 1-2 drops into both eyes 3 (three) times daily as needed (for dry eyes.).     No current facility-administered medications for this visit.     Review of Systems Full ROS  was asked and was negative except for the information on the HPI  Physical Exam Blood pressure (!) 173/78, pulse 80, temperature 98.1 F (36.7 C), temperature source Oral, height 5\' 4"  (1.626 m), weight 161 lb 12.8 oz (73.4 kg), SpO2 96 %. CONSTITUTIONAL: NAD EYES: Pupils are equal, round,  Sclera are non-icteric. EARS, NOSE, MOUTH AND THROAT: She is wearing a mask. Hearing is intact to voice. LYMPH NODES:  Lymph nodes in the neck are normal. RESPIRATORY:  Lungs are clear. There is normal respiratory effort, with equal breath sounds bilaterally, and without pathologic use of accessory muscles. CARDIOVASCULAR: Heart is regular without murmurs, gallops, or rubs. GI: The abdomen is  soft, nontender, and nondistended. There are no palpable masses. There is no hepatosplenomegaly. There are normal bowel sounds in all quadrants. Rectal; normal tone.  There is an obvious ulcerated grade 3 left lateral internal hemorrhoid.  Exam is nontender.  There is no evidence of any masses MUSCULOSKELETAL: Normal muscle strength and tone. No cyanosis or edema.   SKIN: Turgor is good and there are no  pathologic skin lesions or ulcers. NEUROLOGIC: Motor and sensation is grossly normal. Cranial nerves are grossly intact. PSYCH:  Oriented to person, place and time. Affect is normal.  Data  Reviewed  I have personally reviewed the patient's imaging, laboratory findings and medical records.    Assessment/Plan 87 year old female with multiple comorbidities now with persistent hematochezia related to internal hemorrhoid.  Patient is on Eliquis and given all her comorbidities coming the off anticoagulants. Given all her history I do think there is a clear indication for hemorrhoidectomy.  I have discussed with the patient in detail about my thought process and recommendations.  Procedure discussed with patient in detail.  Risks, benefits and possible implications including but not limited to: Bleeding, infection, recurrence chronic pain.  She understands and wished to proceed  Caroleen Hamman, MD FACS General Surgeon 04/03/2020, 2:40 PM

## 2020-04-05 ENCOUNTER — Other Ambulatory Visit
Admission: RE | Admit: 2020-04-05 | Discharge: 2020-04-05 | Disposition: A | Discharge status: Home / Self Care | Payer: Medicare Other | Source: Ambulatory Visit | Attending: Surgery | Admitting: Surgery

## 2020-04-05 ENCOUNTER — Encounter: Payer: Self-pay | Admitting: Internal Medicine

## 2020-04-05 ENCOUNTER — Other Ambulatory Visit: Payer: Medicare Other

## 2020-04-05 ENCOUNTER — Other Ambulatory Visit: Payer: Self-pay

## 2020-04-05 DIAGNOSIS — Z20822 Contact with and (suspected) exposure to covid-19: Secondary | ICD-10-CM | POA: Diagnosis not present

## 2020-04-05 DIAGNOSIS — Z01812 Encounter for preprocedural laboratory examination: Secondary | ICD-10-CM | POA: Insufficient documentation

## 2020-04-05 HISTORY — DX: Anemia, unspecified: D64.9

## 2020-04-05 HISTORY — DX: Presence of cardiac pacemaker: Z95.0

## 2020-04-05 LAB — SARS CORONAVIRUS 2 (TAT 6-24 HRS): SARS Coronavirus 2: NEGATIVE

## 2020-04-05 NOTE — Patient Instructions (Signed)
Your procedure is scheduled on: Tuesday April 09, 2020. Report to Day Surgery inside [facility] 2nd floor (stop by Admissions desk first before going upstairs). To find out your arrival time please call [phone] between 1PM - 3PM on Monday April 08, 2020.  Remember: Instructions that are not followed completely may result in serious medical risk,  up to and including death, or upon the discretion of your surgeon and anesthesiologist your  surgery may need to be rescheduled.     _X__ 1. Do not eat food after midnight the night before your procedure.                 No chewing gum or hard candies. You may drink clear liquids up to 2 hours                 before you are scheduled to arrive for your surgery- DO not drink clear                 liquids within 2 hours of the start of your surgery.                 Clear Liquids include:  water, apple juice without pulp, clear Gatorade, G2 or                  Gatorade Zero (avoid Red/Purple/Blue), Black Coffee or Tea (Do not add                 anything to coffee or tea).  __X__2.  On the morning of surgery brush your teeth with toothpaste and water, you                may rinse your mouth with mouthwash if you wish.  Do not swallow any toothpaste of mouthwash.     _X__ 3.  No Alcohol for 24 hours before or after surgery.   _X__ 4.  Do Not Smoke or use e-cigarettes For 24 Hours Prior to Your Surgery.                 Do not use any chewable tobacco products for at least 6 hours prior to                 Surgery.  _X__  5.  Do not use any recreational drugs (marijuana, cocaine, heroin, ecstasy, MDMA or other)                For at least one week prior to your surgery.  Combination of these drugs with anesthesia                May have life threatening results.  __X__ 6.  Notify your doctor if there is any change in your medical condition      (cold, fever, infections).     Do not wear jewelry, make-up, hairpins,  clips or nail polish. Do not wear lotions, powders, or perfumes. You may wear deodorant. Do not shave 48 hours prior to surgery. Men may shave face and neck. Do not bring valuables to the hospital.    [facility] is not responsible for any belongings or valuables.  Contacts, dentures or bridgework may not be worn into surgery. Leave your suitcase in the car. After surgery it may be brought to your room. For patients admitted to the hospital, discharge time is determined by your treatment team.   Patients discharged the day of surgery will not be allowed to drive home.  Make arrangements for someone to be with you for the first 24 hours of your Same Day Discharge.   __x__ Take these medicines the morning of surgery with A SIP OF WATER:    1. levothyroxine (SYNTHROID) 50 MCG tablet     __x__ Use Antibacterial SOAP as directed  ____ Use Benzoyl Peroxide Gel as instructed  ____ Use inhalers on the day of surgery  ____ Stop metformin 2 days prior to surgery    ____ Take 1/2 of usual insulin dose the night before surgery. No insulin the morning          of surgery.   __x__ Stop ELIQUIS 5 MG as instructed by your provider.   __x__ Stop Anti-inflammatories such as Ibuprofen, Aleve, Advil, naproxen and or BC powders.    __x__ Stop supplements until after surgery.    __X__ Do not start any herbal supplements before your procedure.    If you have any questions regarding your pre-procedure instructions,  Please call Pre-admit Testing at [phone].

## 2020-04-05 NOTE — Progress Notes (Signed)
PERIOPERATIVE PRESCRIPTION FOR IMPLANTED CARDIAC DEVICE PROGRAMMING   Patient Information: Name:SloaneZakeria, Kulzer   DOB: [birth year]  MRN: [id number]   Planned Procedure: EXAM UNDER ANESTHESIA WITH HEMORRHOIDECTOMY  Surgeon: Dr. Caroleen Hamman, MD  Date of Procedure: 04/09/2020  Cautery will be used.   Please send documentation back to:  [facility] (Fax # [phone])   Karen Kitchens, NP  04/05/20 1:39 PM      Device Information:   Clinic EP Physician:   Virl Axe, MD Device Type:  Pacemaker Manufacturer and Phone #:  Medtronic: [phone] Pacemaker Dependent?:  Yes Date of Last Device Check:  08/28/2019        Normal Device Function?:  Yes     Electrophysiologist's Recommendations:    Have magnet available.  Provide continuous ECG monitoring when magnet is used or reprogramming is to be performed.   Procedure should not interfere with device function.  No device programming or magnet placement needed.  Per Device Clinic Standing Orders, Drake Leach  04/05/2020 2:15 PM

## 2020-04-08 ENCOUNTER — Other Ambulatory Visit: Payer: Medicare Other

## 2020-04-09 ENCOUNTER — Ambulatory Visit: Payer: Medicare Other | Admitting: Anesthesiology

## 2020-04-09 ENCOUNTER — Other Ambulatory Visit: Payer: Self-pay

## 2020-04-09 ENCOUNTER — Ambulatory Visit
Admission: RE | Admit: 2020-04-09 | Discharge: 2020-04-09 | Disposition: A | Discharge status: Home / Self Care | Payer: Medicare Other | Attending: Surgery | Admitting: Surgery

## 2020-04-09 ENCOUNTER — Encounter
Admission: RE | Disposition: A | Discharge status: Home / Self Care | Payer: Self-pay | Source: Home / Self Care | Attending: Surgery

## 2020-04-09 ENCOUNTER — Encounter: Payer: Self-pay | Admitting: Surgery

## 2020-04-09 DIAGNOSIS — Z79899 Other long term (current) drug therapy: Secondary | ICD-10-CM | POA: Insufficient documentation

## 2020-04-09 DIAGNOSIS — Z8673 Personal history of transient ischemic attack (TIA), and cerebral infarction without residual deficits: Secondary | ICD-10-CM | POA: Diagnosis not present

## 2020-04-09 DIAGNOSIS — Z79890 Hormone replacement therapy (postmenopausal): Secondary | ICD-10-CM | POA: Insufficient documentation

## 2020-04-09 DIAGNOSIS — K642 Third degree hemorrhoids: Secondary | ICD-10-CM | POA: Diagnosis not present

## 2020-04-09 DIAGNOSIS — I4891 Unspecified atrial fibrillation: Secondary | ICD-10-CM | POA: Diagnosis not present

## 2020-04-09 DIAGNOSIS — D649 Anemia, unspecified: Secondary | ICD-10-CM | POA: Diagnosis not present

## 2020-04-09 DIAGNOSIS — Z86010 Personal history of colonic polyps: Secondary | ICD-10-CM | POA: Insufficient documentation

## 2020-04-09 DIAGNOSIS — I509 Heart failure, unspecified: Secondary | ICD-10-CM | POA: Diagnosis not present

## 2020-04-09 DIAGNOSIS — K641 Second degree hemorrhoids: Secondary | ICD-10-CM | POA: Diagnosis not present

## 2020-04-09 DIAGNOSIS — Z950 Presence of cardiac pacemaker: Secondary | ICD-10-CM | POA: Insufficient documentation

## 2020-04-09 DIAGNOSIS — Z85828 Personal history of other malignant neoplasm of skin: Secondary | ICD-10-CM | POA: Diagnosis not present

## 2020-04-09 DIAGNOSIS — Z96642 Presence of left artificial hip joint: Secondary | ICD-10-CM | POA: Diagnosis not present

## 2020-04-09 DIAGNOSIS — I482 Chronic atrial fibrillation, unspecified: Secondary | ICD-10-CM | POA: Diagnosis not present

## 2020-04-09 DIAGNOSIS — E785 Hyperlipidemia, unspecified: Secondary | ICD-10-CM | POA: Diagnosis not present

## 2020-04-09 DIAGNOSIS — Z7901 Long term (current) use of anticoagulants: Secondary | ICD-10-CM | POA: Diagnosis not present

## 2020-04-09 DIAGNOSIS — Z7983 Long term (current) use of bisphosphonates: Secondary | ICD-10-CM | POA: Diagnosis not present

## 2020-04-09 DIAGNOSIS — K649 Unspecified hemorrhoids: Secondary | ICD-10-CM | POA: Diagnosis not present

## 2020-04-09 DIAGNOSIS — F418 Other specified anxiety disorders: Secondary | ICD-10-CM | POA: Diagnosis not present

## 2020-04-09 DIAGNOSIS — J4520 Mild intermittent asthma, uncomplicated: Secondary | ICD-10-CM | POA: Diagnosis not present

## 2020-04-09 HISTORY — PX: EVALUATION UNDER ANESTHESIA WITH HEMORRHOIDECTOMY: SHX5624

## 2020-04-09 SURGERY — EXAM UNDER ANESTHESIA WITH HEMORRHOIDECTOMY
Anesthesia: General

## 2020-04-09 MED ORDER — BUPIVACAINE LIPOSOME 1.3 % IJ SUSP
INTRAMUSCULAR | Status: DC | PRN
  Administered 2020-04-09: 20 mL

## 2020-04-09 MED ORDER — DEXAMETHASONE SODIUM PHOSPHATE 10 MG/ML IJ SOLN
INTRAMUSCULAR | Status: AC
  Filled 2020-04-09: qty 1

## 2020-04-09 MED ORDER — GABAPENTIN 100 MG PO CAPS
ORAL_CAPSULE | ORAL | Status: AC
  Administered 2020-04-09: 300 mg via ORAL
  Filled 2020-04-09: qty 3

## 2020-04-09 MED ORDER — ONDANSETRON HCL 4 MG/2ML IJ SOLN
INTRAMUSCULAR | Status: DC | PRN
  Administered 2020-04-09: 4 mg via INTRAVENOUS

## 2020-04-09 MED ORDER — BUPIVACAINE LIPOSOME 1.3 % IJ SUSP
INTRAMUSCULAR | Status: AC
  Filled 2020-04-09: qty 20

## 2020-04-09 MED ORDER — FENTANYL CITRATE (PF) 100 MCG/2ML IJ SOLN
INTRAMUSCULAR | Status: AC
  Filled 2020-04-09: qty 2

## 2020-04-09 MED ORDER — CHLORHEXIDINE GLUCONATE 0.12 % MT SOLN
OROMUCOSAL | Status: AC
  Administered 2020-04-09: 15 mL via OROMUCOSAL
  Filled 2020-04-09: qty 15

## 2020-04-09 MED ORDER — CHLORHEXIDINE GLUCONATE 0.12 % MT SOLN: 15.0000 mL | Freq: Once | OROMUCOSAL | Status: AC

## 2020-04-09 MED ORDER — LACTATED RINGERS IV SOLN: INTRAVENOUS | Status: DC

## 2020-04-09 MED ORDER — FAMOTIDINE 20 MG PO TABS
ORAL_TABLET | ORAL | Status: AC
  Administered 2020-04-09: 20 mg via ORAL
  Filled 2020-04-09: qty 1

## 2020-04-09 MED ORDER — FAMOTIDINE 20 MG PO TABS: 20.0000 mg | ORAL_TABLET | Freq: Once | ORAL | Status: AC

## 2020-04-09 MED ORDER — ACETAMINOPHEN 500 MG PO TABS
ORAL_TABLET | ORAL | Status: AC
  Administered 2020-04-09: 1000 mg via ORAL
  Filled 2020-04-09: qty 2

## 2020-04-09 MED ORDER — ONDANSETRON HCL 4 MG/2ML IJ SOLN: 4.0000 mg | Freq: Once | INTRAMUSCULAR | Status: DC | PRN

## 2020-04-09 MED ORDER — ORAL CARE MOUTH RINSE: 15.0000 mL | Freq: Once | OROMUCOSAL | Status: AC

## 2020-04-09 MED ORDER — CHLORHEXIDINE GLUCONATE CLOTH 2 % EX PADS
6.0000 | MEDICATED_PAD | Freq: Once | CUTANEOUS | Status: AC
  Administered 2020-04-09: 6 via TOPICAL

## 2020-04-09 MED ORDER — GABAPENTIN 300 MG PO CAPS: 300.0000 mg | ORAL_CAPSULE | ORAL | Status: AC

## 2020-04-09 MED ORDER — PROPOFOL 10 MG/ML IV BOLUS
INTRAVENOUS | Status: AC
  Filled 2020-04-09: qty 20

## 2020-04-09 MED ORDER — HYDROCODONE-ACETAMINOPHEN 5-325 MG PO TABS: 1.0000 | ORAL_TABLET | ORAL | 0 refills | Status: DC | PRN

## 2020-04-09 MED ORDER — PROPOFOL 10 MG/ML IV BOLUS
INTRAVENOUS | Status: DC | PRN
  Administered 2020-04-09: 130 mg via INTRAVENOUS

## 2020-04-09 MED ORDER — FENTANYL CITRATE (PF) 100 MCG/2ML IJ SOLN
INTRAMUSCULAR | Status: DC | PRN
  Administered 2020-04-09 (×2): 25 ug via INTRAVENOUS

## 2020-04-09 MED ORDER — ACETAMINOPHEN 500 MG PO TABS: 1000.0000 mg | ORAL_TABLET | ORAL | Status: AC

## 2020-04-09 MED ORDER — FENTANYL CITRATE (PF) 100 MCG/2ML IJ SOLN: 25.0000 ug | INTRAMUSCULAR | Status: DC | PRN

## 2020-04-09 MED ORDER — MEPERIDINE HCL 50 MG/ML IJ SOLN
6.2500 mg | INTRAMUSCULAR | Status: DC | PRN
Start: 2020-04-09 — End: 2020-04-09

## 2020-04-09 MED ORDER — LIDOCAINE HCL (CARDIAC) PF 100 MG/5ML IV SOSY
PREFILLED_SYRINGE | INTRAVENOUS | Status: DC | PRN
  Administered 2020-04-09: 60 mg via INTRAVENOUS

## 2020-04-09 MED ORDER — LIDOCAINE HCL (PF) 2 % IJ SOLN
INTRAMUSCULAR | Status: AC
  Filled 2020-04-09: qty 5

## 2020-04-09 MED ORDER — DEXAMETHASONE SODIUM PHOSPHATE 10 MG/ML IJ SOLN
INTRAMUSCULAR | Status: DC | PRN
  Administered 2020-04-09: 10 mg via INTRAVENOUS

## 2020-04-09 SURGICAL SUPPLY — 28 items
BLADE CLIPPER SURG (BLADE) ×2 IMPLANT
BLADE SURG 15 STRL LF DISP TIS (BLADE) ×1 IMPLANT
BLADE SURG 15 STRL SS (BLADE) ×2
BRIEF STRETCH MATERNITY 2XLG (MISCELLANEOUS) ×2 IMPLANT
CANISTER SUCT 1200ML W/VALVE (MISCELLANEOUS) ×2 IMPLANT
COVER WAND RF STERILE (DRAPES) ×2 IMPLANT
DRAPE 3/4 80X56 (DRAPES) ×2 IMPLANT
DRAPE LAPAROTOMY 77X122 PED (DRAPES) ×2 IMPLANT
DRAPE LEGGINS SURG 28X43 STRL (DRAPES) IMPLANT
DRAPE UNDER BUTTOCK W/FLU (DRAPES) IMPLANT
DRSG GAUZE FLUFF 36X18 (GAUZE/BANDAGES/DRESSINGS) ×2 IMPLANT
ELECT CAUTERY BLADE 6.4 (BLADE) ×2 IMPLANT
ELECT REM PT RETURN 9FT ADLT (ELECTROSURGICAL) ×2
ELECTRODE REM PT RTRN 9FT ADLT (ELECTROSURGICAL) ×1 IMPLANT
GLOVE SURG ENC MOIS LTX SZ7 (GLOVE) ×2 IMPLANT
GOWN STRL REUS W/ TWL LRG LVL3 (GOWN DISPOSABLE) ×2 IMPLANT
GOWN STRL REUS W/TWL LRG LVL3 (GOWN DISPOSABLE) ×4
MANIFOLD NEPTUNE II (INSTRUMENTS) ×2 IMPLANT
NEEDLE HYPO 22GX1.5 SAFETY (NEEDLE) ×2 IMPLANT
NS IRRIG 500ML POUR BTL (IV SOLUTION) ×2 IMPLANT
PACK BASIN MINOR ARMC (MISCELLANEOUS) ×2 IMPLANT
PAD PREP 24X41 OB/GYN DISP (PERSONAL CARE ITEMS) ×2 IMPLANT
SHEARS HARMONIC 9CM CVD (BLADE) ×2 IMPLANT
SOL PREP PVP 2OZ (MISCELLANEOUS) ×2
SOLUTION PREP PVP 2OZ (MISCELLANEOUS) ×1 IMPLANT
SPONGE LAP 18X18 RF (DISPOSABLE) ×2 IMPLANT
SURGILUBE 2OZ TUBE FLIPTOP (MISCELLANEOUS) ×2 IMPLANT
SYR 20ML LL LF (SYRINGE) ×2 IMPLANT

## 2020-04-09 NOTE — Anesthesia Preprocedure Evaluation (Signed)
Anesthesia Evaluation  Patient identified by MRN, date of birth, ID band Patient awake    Reviewed: Allergy & Precautions, H&P , NPO status , Patient's Chart, lab work & pertinent test results  History of Anesthesia Complications Negative for: history of anesthetic complications  Airway Mallampati: II  TM Distance: >3 FB     Dental no notable dental hx. (+) Teeth Intact   Pulmonary shortness of breath and with exertion, asthma , neg sleep apnea, neg COPD,    breath sounds clear to auscultation       Cardiovascular (-) angina(-) Past MI and (-) Cardiac Stents + dysrhythmias (AV block s/p pacemaker) Atrial Fibrillation + pacemaker + Cardiac Defibrillator + Valvular Problems/Murmurs (s/p MV repair in 2000) MR  Rhythm:regular Rate:Normal     Neuro/Psych PSYCHIATRIC DISORDERS Anxiety Depression CVA, No Residual Symptoms    GI/Hepatic Neg liver ROS,   Endo/Other  Hypothyroidism   Renal/GU negative Renal ROS  negative genitourinary   Musculoskeletal  (+) Arthritis , Osteoarthritis,    Abdominal   Peds  Hematology  (+) Blood dyscrasia, anemia , Hgb 11.9   Anesthesia Other Findings Past Medical History: No date: Allergic rhinitis No date: 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 s/p PVI [name] 2010;  b. on tikosyn/coumadin;  c.               05/2009 Echo: EF 60-65%, Gr 2 DD. (first dx 09/ 2007) No date: Bilateral lower extremity edema No date: Bleeding hemorrhoid No date: Carotid stenosis     Comment:  mild (hosp 3/11)- consult by vasc/ Dr Donnetta Hutching No date: Complication of anesthesia     Comment:  hard to wake No date: Diverticulosis of colon No date: Dyspnea     Comment:  on exertion-climbing stairs No date: Fatty liver No date: H/O cardiac radiofrequency ablation     Comment:  01/ 2008 at [place]  [facility] [place] /  03/ 2010  at [facility] No date: Heart failure with preserved ejection fraction ([place]) No date: History of adenomatous polyp of colon     Comment:  tubular adenoma's No date: History of cardiomyopathy     Comment:  secondary tachycardia-induced cardiomyopathy -- resolved              2014 No date: History of squamous cell carcinoma in situ (SCCIS) of skin     Comment:  05/ 2017  nasal bridge and right medial knee No date: History of transient ischemic attack (TIA)     Comment:  01-24-2005 and 06-12-2009 No date: Hyperlipidemia No date: Hypothyroidism No date: Mild intermittent asthma     Comment:  reacts to cats No date: Mixed stress and urge urinary incontinence No date: Pulmonary nodule 07/29/2019: S/P AV nodal ablation 07/28/19 10-23-1998  dr Boyce Medici at [facility]: S/P mitral valve repair     Comment:  for MVP and regurg. (annuloplasty ring procedure) 07/29/2019: S/P placement of cardiac pacemaker MDT 07/28/19  Past Surgical History: 1978: APPENDECTOMY 07/28/2019: AV NODE ABLATION; N/A     Comment:  Procedure: AV NODE ABLATION;  Surgeon: Deboraha Sprang,               MD;  Location: [place] CV LAB;  Service:               Cardiovascular;  Laterality: N/A; 06/19/2019: BUBBLE STUDY  Comment:  Procedure: BUBBLE STUDY;  Surgeon: Pixie Casino, MD;              Location: [facility] ENDOSCOPY;  Service: Cardiovascular;; 01/ 2008    at [place] AND ABLATION     Comment:  right-sided ablation atrial flutter 03/ 2010   dr Jaymes Graff at [facility] [place]:  AV node ablation and pulmonary vein isolation for atrial              fib 06-18-2006;  07-13-2006;  10-19-2010;  10-27-2010: CARDIOVERSION No date: COLONOSCOPY 10/13/2017: COLONOSCOPY WITH PROPOFOL; N/A     Comment:  Procedure: COLONOSCOPY WITH PROPOFOL;  Surgeon: [name], MD;  Location: [facility] ENDOSCOPY;  Service:                Gastroenterology;  Laterality: N/A; 07-26-2007   dr Matilde Sprang: CYSTO/ TRANSURETHRAL COLLAGEN INJECTION  THERAPY No date: DILATION AND CURETTAGE OF UTERUS 10/13/2017: ESOPHAGOGASTRODUODENOSCOPY (EGD) WITH PROPOFOL; N/A     Comment:  Procedure: ESOPHAGOGASTRODUODENOSCOPY (EGD) WITH               PROPOFOL;  Surgeon: [name], MD;  Location: [facility]               ENDOSCOPY;  Service: Gastroenterology;  Laterality: N/A; 1980s: EXCISIONAL HEMORRHOIDECTOMY 12/08/2017: GIVENS CAPSULE STUDY; N/A     Comment:  Procedure: GIVENS CAPSULE STUDY;  Surgeon: [name],               MD;  Location: [facility] ENDOSCOPY;  Service:               Gastroenterology;  Laterality: N/A; 10/29/2016: HEMORRHOID SURGERY; N/A     Comment:  Procedure: HEMORRHOIDECTOMY;  Surgeon: Leighton Ruff,               MD;  Location[facility];  Service:               General;  Laterality: N/A; 10/23/1998: MITRAL VALVE ANNULOPLASTY     Comment:  "Model 4625; Seriel [id number]"; size 32mm; [facility]; Dr. Boyce Medici 07/28/2019: PACEMAKER IMPLANT; N/A     Comment:  Procedure: PACEMAKER IMPLANT;  Surgeon: Deboraha Sprang,              MD;  Location: [place] CV LAB;  Service:               Cardiovascular;  Laterality: N/A; 1954: PILONIDAL CYST EXCISION 05-06-2006 at [facility];  01-02-2013 at [facility]: TEE WITH CARDIOVERSION 06/19/2019: TEE WITHOUT CARDIOVERSION; N/A     Comment:  Procedure: TRANSESOPHAGEAL ECHOCARDIOGRAM (TEE);                Surgeon: Pixie Casino, MD;  Location: [facility] ENDOSCOPY;                Service: Cardiovascular;  Laterality: N/A; 05/04/2017: TOTAL HIP ARTHROPLASTY; Left     Comment:  Procedure: LEFT TOTAL HIP ARTHROPLASTY ANTERIOR               APPROACH;  Surgeon: Mcarthur Rossetti, MD;                Location: [place];  Service: Orthopedics;  Laterality:  Left; 05-01-2015   dr Caryl Comes: TRANSTHORACIC ECHOCARDIOGRAM     Comment:  ef 50-55%/  mild AV sclerosis  without stenosis/  post MV              repair with mild central MR (valve area by pressure               half-time 2cm^2,  valve area by continutity equation               0.91cm^2, peak grandiant 70mmHg)/  severe LAE/ mild TR/               mild RAE  1978: TUBAL LIGATION; Bilateral  BMI    Body Mass Index: 27.99 kg/m      Reproductive/Obstetrics negative OB ROS                             Anesthesia Physical  Anesthesia Plan  ASA: III  Anesthesia Plan: General   Post-op Pain Management:    Induction: Intravenous  PONV Risk Score and Plan: 2  Airway Management Planned: LMA  Additional Equipment:   Intra-op Plan:   Post-operative Plan: Extubation in OR  Informed Consent: I have reviewed the patients History and Physical, chart, labs and discussed the procedure including the risks, benefits and alternatives for the proposed anesthesia with the patient or authorized representative who has indicated his/her understanding and acceptance.     Dental Advisory Given  Plan Discussed with: Anesthesiologist, CRNA and Surgeon  Anesthesia Plan Comments:         Anesthesia Quick Evaluation

## 2020-04-09 NOTE — Interval H&P Note (Signed)
History and Physical Interval Note:  04/09/2020 10:02 AM  [name]  has presented today for surgery, with the diagnosis of hemorrhoids.  The various methods of treatment have been discussed with the patient and family. After consideration of risks, benefits and other options for treatment, the patient has consented to  Procedure(s): EXAM UNDER ANESTHESIA WITH HEMORRHOIDECTOMY (N/A) as a surgical intervention.  The patient's history has been reviewed, patient examined, no change in status, stable for surgery.  I have reviewed the patient's chart and labs.  Questions were answered to the patient's satisfaction.     [place]

## 2020-04-09 NOTE — Anesthesia Procedure Notes (Signed)
Procedure Name: LMA Insertion Date/Time: 04/09/2020 10:40 AM Performed by: Jonna Clark, CRNA Pre-anesthesia Checklist: Patient identified, Patient being monitored, Timeout performed, Emergency Drugs available and Suction available Patient Re-evaluated:Patient Re-evaluated prior to induction Oxygen Delivery Method: Circle system utilized Preoxygenation: Pre-oxygenation with 100% oxygen Induction Type: IV induction Ventilation: Mask ventilation without difficulty LMA: LMA inserted LMA Size: 4.0 Tube type: Oral Number of attempts: 1 Placement Confirmation: positive ETCO2 and breath sounds checked- equal and bilateral Tube secured with: Tape Dental Injury: Teeth and Oropharynx as per pre-operative assessment

## 2020-04-09 NOTE — Op Note (Signed)
  04/09/2020  11:21 AM  PATIENT:  [name]  87 y.o. female  PRE-OPERATIVE DIAGNOSIS:  Bleeding hemorrhoids  POST-OPERATIVE DIAGNOSIS:  Same  PROCEDURE:   Exam under anesthesia Hemorrhoidectomy x2 Left lateral and right lateral   SURGEON:  Surgeon(s) and Role:    * Errik Mitchelle F, MD - Primary  FINDINGS:  Left lateral ulcerated grade III internal hemorrhoid Right lateral ulcerated grade II internal hemorrhoid  ANESTHESIA: General  EBL: minimal   DICTATION:  Patient was explayined about the procedure in detail. Risks, benefits and possible complications and a consent was obtained. The patient taken to the operating room and placed in the lithotomy position.  Rectal exam revealed evidence of ulcerated left lateral right lateral hemorrhoids the left being more significant than the right. Rectal Speculum was placed on the left lateral hemorrhoidal cushion was elevated between Allis clamps.  Using the harmonic device we dissected and remove the hemorrhoid.  Adequate hemostasis was obtained.  Attention was turned to the contralateral side we will perform identical procedure elevating the hemorrhoidal cushion and divided it with the harmonic device.  Second look revealed good hemostasis and no other intrarectal lesions. Liposomal marcaine was infiltrated anorectal area for post op analgesia Needle and laparotomy counts were correct and there were no immediate complications  Jules Husbands, MD

## 2020-04-09 NOTE — Discharge Instructions (Signed)
Surgical Procedures for Hemorrhoids, Care After This sheet gives you information about how to care for yourself after your procedure. Your health care provider may also give you more specific instructions. If you have problems or questions, contact your health care provider. What can I expect after the procedure? After the procedure, it is common to have:  Rectal pain.  Pain when you are having a bowel movement.  Slight rectal bleeding. This is more likely to happen with the first bowel movement after surgery. Follow these instructions at home: Medicines  Take over-the-counter and prescription medicines only as told by your health care provider.  If you were prescribed an antibiotic medicine, use it as told by your health care provider. Do not stop using the antibiotic even if your condition improves.  Ask your health care provider if the medicine prescribed to you requires you to avoid driving or using heavy machinery.  Use a stool softener or a bulk laxative as told by your health care provider. Eating and drinking  Follow instructions from your health care provider about what to eat or drink after your procedure.  You may need to take actions to prevent or treat constipation, such as: ? Drink enough fluid to keep your urine pale yellow. ? Take over-the-counter or prescription medicines. ? Eat foods that are high in fiber, such as beans, whole grains, and fresh fruits and vegetables. ? Limit foods that are high in fat and processed sugars, such as fried or sweet foods. Activity  Rest as told by your health care provider.  Avoid sitting for a long time without moving. Get up to take short walks every 1-2 hours. This is important to improve blood flow and breathing. Ask for help if you feel weak or unsteady.  Return to your normal activities as told by your health care provider. Ask your health care provider what activities are safe for you.  Do not lift anything that is heavier  than 10 lb (4.5 kg), or the limit that you are told, until your health care provider says that it is safe.  Do not strain to have a bowel movement.  Do not spend a long time sitting on the toilet.   General instructions  Take warm sitz baths for 15-20 minutes, 2-3 times a day to relieve soreness or itching and to keep the rectal area clean.  Apply ice packs to the area to reduce swelling and pain.  Do not drive for 24 hours if you were given a sedative during your procedure.  Keep all follow-up visits as told by your health care provider. This is important.   Contact a health care provider if:  Your pain medicine is not helping.  You have a fever or chills.  You have bad smelling drainage.  You have a lot of swelling.  You become constipated.  You have trouble passing urine. Get help right away if:  You have very bad rectal pain.  You have heavy bleeding from your rectum. Summary  After the procedure, it is common to have pain and slight rectal bleeding.  Take warm sitz baths for 15-20 minutes, 2-3 times a day to relieve soreness or itching and to keep the rectal area clean.  Avoid straining when having a bowel movement.  Eat foods that are high in fiber, such as beans, whole grains, and fresh fruits and vegetables.  Take over-the-counter and prescription medicines only as told by your health care provider. This information is not intended to replace advice  given to you by your health care provider. Make sure you discuss any questions you have with your health care provider. Document Revised: 08/24/2018 Document Reviewed: 01/25/2018 Elsevier Patient Education  2021 [employer].

## 2020-04-09 NOTE — Anesthesia Postprocedure Evaluation (Signed)
Anesthesia Post Note  Patient: [name]  Procedure(s) Performed: EXAM UNDER ANESTHESIA WITH HEMORRHOIDECTOMY (N/A )  Patient location during evaluation: PACU Anesthesia Type: General Level of consciousness: awake and alert, awake and oriented Pain management: pain level controlled Vital Signs Assessment: post-procedure vital signs reviewed and stable Respiratory status: spontaneous breathing, nonlabored ventilation and respiratory function stable Cardiovascular status: blood pressure returned to baseline Postop Assessment: no apparent nausea or vomiting Anesthetic complications: no   No complications documented.   Last Vitals:  Vitals:   04/09/20 1212 04/09/20 1231  BP: (!) 121/58 129/76  Pulse: 70 72  Resp: 10 14  Temp: 36.8 C 36.6 C  SpO2: 92% 97%    Last Pain:  Vitals:   04/09/20 1231  TempSrc: Temporal  PainSc: 0-No pain                 Phill Mutter

## 2020-04-09 NOTE — Transfer of Care (Signed)
Immediate Anesthesia Transfer of Care Note  Patient: [name]  Procedure(s) Performed: EXAM UNDER ANESTHESIA WITH HEMORRHOIDECTOMY (N/A )  Patient Location: PACU  Anesthesia Type:General  Level of Consciousness: drowsy and patient cooperative  Airway & Oxygen Therapy: Patient Spontanous Breathing and Patient connected to face mask oxygen  Post-op Assessment: Report given to RN and Post -op Vital signs reviewed and stable  Post vital signs: Reviewed and stable  Last Vitals:  Vitals Value Taken Time  BP 131/65 04/09/20 1127  Temp 36.4 C 04/09/20 1127  Pulse 68 04/09/20 1129  Resp 13 04/09/20 1129  SpO2 100 % 04/09/20 1129  Vitals shown include unvalidated device data.  Last Pain:  Vitals:   04/09/20 0947  TempSrc: Temporal  PainSc: 0-No pain         Complications: No complications documented.

## 2020-04-10 LAB — SURGICAL PATHOLOGY

## 2020-04-11 ENCOUNTER — Telehealth: Payer: Self-pay

## 2020-04-11 NOTE — Telephone Encounter (Signed)
Spoke with patient to notify her of recent surgical pathology results per Dr.Pabon says it confirmed hemorrhoids. Patient states she does have a little bleeding and informed me that she has recently started back taking her prescription for Eliquis. Patient was informed that she may experience some bleeding as she just had surgery on Tuesday. Patient states she will give our office a call should she noticed any problems or if the bleeding continues.

## 2020-04-11 NOTE — Telephone Encounter (Signed)
-----   Message from Jules Husbands, MD sent at 04/11/2020  2:09 PM EST ----- Please let her know that pathology confirmed hemorrhoids ----- Message ----- From: Interface, Lab In [facility] Sent: 04/10/2020  11:35 AM EST To: Jules Husbands, MD

## 2020-04-12 ENCOUNTER — Other Ambulatory Visit: Payer: Medicare Other

## 2020-04-12 ENCOUNTER — Other Ambulatory Visit: Payer: Self-pay | Admitting: Gastroenterology

## 2020-04-15 ENCOUNTER — Ambulatory Visit (INDEPENDENT_AMBULATORY_CARE_PROVIDER_SITE_OTHER): Payer: Medicare Other | Admitting: Surgery

## 2020-04-15 ENCOUNTER — Telehealth: Payer: Self-pay | Admitting: Gastroenterology

## 2020-04-15 ENCOUNTER — Other Ambulatory Visit: Payer: Self-pay

## 2020-04-15 ENCOUNTER — Telehealth: Payer: Self-pay | Admitting: Surgery

## 2020-04-15 ENCOUNTER — Telehealth: Payer: Self-pay | Admitting: Internal Medicine

## 2020-04-15 ENCOUNTER — Encounter: Payer: Self-pay | Admitting: Surgery

## 2020-04-15 VITALS — BP 147/86 | HR 86 | Temp 98.2°F | Ht 64.0 in | Wt 159.4 lb

## 2020-04-15 DIAGNOSIS — Z09 Encounter for follow-up examination after completed treatment for conditions other than malignant neoplasm: Secondary | ICD-10-CM

## 2020-04-15 DIAGNOSIS — I4891 Unspecified atrial fibrillation: Secondary | ICD-10-CM

## 2020-04-15 MED ORDER — WARFARIN SODIUM 5 MG PO TABS: 5.0000 mg | ORAL_TABLET | Freq: Every day | ORAL | 1 refills | Status: DC

## 2020-04-15 NOTE — Telephone Encounter (Signed)
Pt c/o medication issue:  1. Name of Medication:   ELIQUIS 5 MG TABS tablet     2. How are you currently taking this medication (dosage and times per day)? 5 mg by mouth 2 (two) times daily  3. Are you having a reaction (difficulty breathing--STAT)? Yes   4. What is your medication issue? Patient is requesting to speak with Dr. Olin Pia nurse. She assumes the medication is causing bleeding. She states she has been bleeding and passing clots for the past 2 days. She is requesting to be placed on an alternative. Please assist. Patient had a hemorrhoidectomy on 04/09/20.

## 2020-04-15 NOTE — Telephone Encounter (Signed)
Spoke with patient.  She has thrown out all of her Eliquis and wants to restart warfarin.  Will restart at 5 mg once daily and scheduled new coumadin appointment

## 2020-04-15 NOTE — Patient Instructions (Addendum)
Dr Dahlia Byes advised patient to STOP prescription for Eliquis for 2 days. Patient advised to STOP using Peroxide. Patient advised to use cold water and ice pack (cold compress) to the area to help with discomfort or pain. Patient will follow up in one with Dr.Pabon.  GENERAL POST-OPERATIVE PATIENT INSTRUCTIONS   WOUND CARE INSTRUCTIONS:  Keep a dry clean dressing on the wound if there is drainage. The initial bandage may be removed after 24 hours.  Once the wound has quit draining you may leave it open to air.  If clothing rubs against the wound or causes irritation and the wound is not draining you may cover it with a dry dressing during the daytime.  Try to keep the wound dry and avoid ointments on the wound unless directed to do so.  If the wound becomes bright red and painful or starts to drain infected material that is not clear, please contact your physician immediately.  If the wound is mildly pink and has a thick firm ridge underneath it, this is normal, and is referred to as a healing ridge.  This will resolve over the next 4-6 weeks.  BATHING: You may shower if you have been informed of this by your surgeon. However, Please do not submerge in a tub, hot tub, or pool until incisions are completely sealed or have been told by your surgeon that you may do so.  DIET:  You may eat any foods that you can tolerate.  It is a good idea to eat a high fiber diet and take in plenty of fluids to prevent constipation.  If you do become constipated you may want to take a mild laxative or take ducolax tablets on a daily basis until your bowel habits are regular.  Constipation can be very uncomfortable, along with straining, after recent surgery.  ACTIVITY:  You are encouraged to cough and deep breath or use your incentive spirometer if you were given one, every 15-30 minutes when awake.  This will help prevent respiratory complications and low grade fevers post-operatively if you had a general anesthetic.  You  may want to hug a pillow when coughing and sneezing to add additional support to the surgical area, if you had abdominal or chest surgery, which will decrease pain during these times.  You are encouraged to walk and engage in light activity for the next two weeks.  You should not lift more than 20 pounds, until 4 to 6 weeks after surgery as it could put you at increased risk for complications.  Twenty pounds is roughly equivalent to a plastic bag of groceries. At that time- Listen to your body when lifting, if you have pain when lifting, stop and then try again in a few days. Soreness after doing exercises or activities of daily living is normal as you get back in to your normal routine.  MEDICATIONS:  Try to take narcotic medications and anti-inflammatory medications, such as tylenol, ibuprofen, naprosyn, etc., with food.  This will minimize stomach upset from the medication.  Should you develop nausea and vomiting from the pain medication, or develop a rash, please discontinue the medication and contact your physician.  You should not drive, make important decisions, or operate machinery when taking narcotic pain medication.  SUNBLOCK Use sun block to incision area over the next year if this area will be exposed to sun. This helps decrease scarring and will allow you avoid a permanent darkened area over your incision.  QUESTIONS:  Please feel free to  call our office if you have any questions, and we will be glad to assist you. [phone].

## 2020-04-15 NOTE — Telephone Encounter (Signed)
Patient returning Christopher's call, will await a call back. Best contact number is [phone]

## 2020-04-15 NOTE — Telephone Encounter (Signed)
No refill, if initial script not working then will need banding

## 2020-04-15 NOTE — Telephone Encounter (Signed)
Spoke with [name] who reports she had a HEMORRHOIDECTOMY completed on 04/09/2020.  [name] reports she woke this morning with bleeding from site and passing large blood clots.  [name] states she plans on calling surgeon now but refuses to take Eliquis again as this bleeding issue has been an ongoing problem for months and she will not go to ED for evaluation when she knows what is going on nor will she continue to have procedures.  [name] states she knows the bleeding is from the Eliquis.  [name] reports when she was off Eliquis before and after surgery she did not have any bleeding issues.  [name] would like to restart on Warfarin.  [name] advised will forward information to Dr Caryl Comes and Pharmacy team for review and recommendation.  [name] verbalizes understanding and agrees with current plan.

## 2020-04-15 NOTE — Telephone Encounter (Signed)
Pt called requesting to spk w/clinical person to discuss come bleeding concerns post hemorrhoidectomy w/Dr. Dahlia Byes (04/09/20).  Bleeding stopped for a couple days, so she restarted Eliquis; however, excessive hemorraging this morning.  She has been in touch w/Dr. Jens Som (cardio) about stopping her Eliquis for now but feels she needs to figure out what is causing this breakthrough bleeding.  She is trying to decide if an appt is needed to check her incision or where to go from here.  She has had a BM, so no issues there but is still quite concerned as to why she continues to pass so much blood.  Plz call back @ [phone] to discuss.

## 2020-04-15 NOTE — Telephone Encounter (Signed)
I contacted the patient to see if the Anusol helped and if she really needed another refill. Left voicemail to call office back. Should this be refilled if she needs it?

## 2020-04-15 NOTE — Telephone Encounter (Signed)
Spoke with Dr.Pabon to come in this afternoon for excessive bleeding after hemorrhoidectomy surgery on 04/09/20. Patient verbalized understanding and has no further questions.

## 2020-04-15 NOTE — Telephone Encounter (Signed)
Patient calling you back, please call

## 2020-04-15 NOTE — Telephone Encounter (Signed)
Called and left message on machine for patient to call back.

## 2020-04-15 NOTE — Telephone Encounter (Signed)
Ok to restart warfarin

## 2020-04-15 NOTE — Telephone Encounter (Signed)
Spoke with patient and she is feeling better. Does not need a refill. I will refuse

## 2020-04-15 NOTE — Telephone Encounter (Signed)
Spoke with patient and at this time she would like to hold off on refilling the Anusol. She said the pain as eased off.

## 2020-04-17 ENCOUNTER — Encounter: Payer: Self-pay | Admitting: Surgery

## 2020-04-17 NOTE — Progress Notes (Signed)
[name] is a 87 year old female following up for hemorrhoidectomy.  Pathology discussed with the patient.  She had some hematochezia after she started the Eliquis.  No fevers no chills some pain  PE  NAD Abd: soft nt Rectal : Hemorrhoidectomy sites healing well.  The several areas but this is expected.  No evidence of active bleeding.  A/P hematochezia likely exacerbated by Eliquis.  We will hold off anticoagulation for 48 hours.  She will come back in 1 to 2 weeks.  No need for hospitalization, transfusion or further intervention at this time

## 2020-04-23 ENCOUNTER — Encounter: Payer: Self-pay | Admitting: Family Medicine

## 2020-04-23 ENCOUNTER — Other Ambulatory Visit: Payer: Self-pay

## 2020-04-23 ENCOUNTER — Ambulatory Visit (INDEPENDENT_AMBULATORY_CARE_PROVIDER_SITE_OTHER): Payer: Medicare Other | Admitting: Family Medicine

## 2020-04-23 VITALS — BP 140/80 | HR 78 | Temp 97.2°F | Ht 64.0 in | Wt 157.4 lb

## 2020-04-23 DIAGNOSIS — K649 Unspecified hemorrhoids: Secondary | ICD-10-CM

## 2020-04-23 DIAGNOSIS — N3946 Mixed incontinence: Secondary | ICD-10-CM | POA: Diagnosis not present

## 2020-04-23 DIAGNOSIS — N362 Urethral caruncle: Secondary | ICD-10-CM | POA: Insufficient documentation

## 2020-04-23 DIAGNOSIS — I4821 Permanent atrial fibrillation: Secondary | ICD-10-CM | POA: Diagnosis not present

## 2020-04-23 DIAGNOSIS — K625 Hemorrhage of anus and rectum: Secondary | ICD-10-CM | POA: Diagnosis not present

## 2020-04-23 DIAGNOSIS — E039 Hypothyroidism, unspecified: Secondary | ICD-10-CM

## 2020-04-23 DIAGNOSIS — R102 Pelvic and perineal pain: Secondary | ICD-10-CM

## 2020-04-23 DIAGNOSIS — I482 Chronic atrial fibrillation, unspecified: Secondary | ICD-10-CM | POA: Diagnosis not present

## 2020-04-23 LAB — TSH: TSH: 3.41 u[IU]/mL (ref 0.35–4.50)

## 2020-04-23 NOTE — Assessment & Plan Note (Signed)
S/p surgery  Bleeding finally slowed post op when she changed from eliquis to warfarin

## 2020-04-23 NOTE — Patient Instructions (Addendum)
You have a small growth on your urethra  I will place a referral to urology to check that out  The office will call you about that   If pelvic discomfort worsens or fails to improve in the next several months let us know (would consider an ultrasound)   If any bowel changes let us know   Thyroid test today

## 2020-04-23 NOTE — Assessment & Plan Note (Signed)
Vague bilateral pelvic pain  Not reproduced on pelvic exam (no tenderness and no mass noted)- reassuring  Planning on urology visit for urethral caruncle and incontinence

## 2020-04-23 NOTE — Assessment & Plan Note (Signed)
Re check TSH today on 50 mcg of levothyroxine  No clinical changes

## 2020-04-23 NOTE — Assessment & Plan Note (Signed)
Noted on exam -not painful /pt unaware Pt also has h/o urinary incontinence  Ref to urology  Past h/o collagen injections (not helpful) years ago)

## 2020-04-23 NOTE — Progress Notes (Signed)
Subjective:    Patient ID: [name], female    DOB: 1932-05-12, 87 y.o.   MRN: [id number]  This visit occurred during the SARS-CoV-2 public health emergency.  Safety protocols were in place, including screening questions prior to the visit, additional usage of staff PPE, and extensive cleaning of exam room while observing appropriate contact time as indicated for disinfecting solutions.    HPI Pt presents for 6 month f/u of chronic medical problems   Wt Readings from Last 3 Encounters:  04/23/20 157 lb 6 oz (71.4 kg)  04/15/20 159 lb 6.4 oz (72.3 kg)  04/05/20 157 lb (71.2 kg)   27.01 kg/m  Has lost wt/ intentionally  Feels good about that-eating better Binnie Kand carbs and lots of water  Eats veggies (lean and green)   She had hemorrhoidectomy since last visit (with bleeding)  A lot of bleeding initially after and now it has slowed down   Lab Results  Component Value Date   WBC 4.0 03/04/2020   HGB 12.4 03/04/2020   HCT 37.2 03/04/2020   MCV 84.3 03/04/2020   PLT 168.0 03/04/2020   Iron oral   H/o a fib with past cardiomyopathy  Switched from eliquis to warfarin   BP Readings from Last 3 Encounters:  04/23/20 140/80  04/15/20 (!) 147/86  04/09/20 129/76   Pulse Readings from Last 3 Encounters:  04/23/20 78  04/15/20 86  04/09/20 72    Hypothyroidism Lab Results  Component Value Date   TSH 7.77 (H) 03/04/2020   we inc levothyroxine to 50 mcg daily last time  Due for labs  C/o pelvic discomfort  Is better than it was but still there  She has regular bms  Aching feeling - both sides /ovary area (at times worse on the right)  No tenderness  No vaginal d/c or bleeding  She does have bladder leakage (had had urethral collagen injection in the past)   No h/o abn pap test in the past Not sexually active    Could not get her mammogram due to pacemaker   Patient Active Problem List   Diagnosis Date Noted  . Urethral caruncle 04/23/2020  . Pelvic pain  03/11/2020  . Routine general medical examination at a health care facility 03/11/2020  . Elevated glucose 03/03/2020  . Rectal bleeding 02/18/2020  . History of TIA (transient ischemic attack) 02/18/2020  . S/P AV nodal ablation 07/28/19 07/29/2019  . S/P placement of cardiac pacemaker MDT 07/28/19 07/29/2019  . AV block 07/28/2019  . CVA (cerebral vascular accident) ([place]) 06/16/2019  . Facial tingling 11/29/2018  . Tremor of left hand 11/29/2018  . Medicare annual wellness visit, subsequent 11/24/2018  . Iron deficiency anemia 10/21/2017  . Rapid atrial fibrillation ([place]) 08/19/2017  . Constipation 08/02/2017  . Numbness and tingling 07/14/2017  . Paresthesia 07/14/2017  . Unilateral primary osteoarthritis, left hip 05/04/2017  . Status post total replacement of left hip 05/04/2017  . Hip osteoarthritis 04/27/2017  . Long term (current) use of anticoagulants 03/04/2017  . Venous stasis dermatitis of both lower extremities 01/08/2017  . Impacted cerumen of right ear 11/20/2016  . Osteopenia 10/25/2016  . Pedal edema 08/26/2016  . Varicose veins of both lower extremities 08/26/2016  . Estrogen deficiency 08/26/2016  . Screening mammogram, encounter for 08/26/2016  . Hemorrhoids 08/26/2016  . History of nonmelanoma skin cancer 01/01/2016  . Hip pain 08/02/2014  . Left knee pain 08/02/2014  . Chronic cough 05/08/2014  . Hematochezia 04/09/2014  .  Caregiver stress 08/16/2013  . Colon cancer screening 08/16/2013  . Encounter for therapeutic drug monitoring 04/20/2013  . Left ovarian cyst 03/14/2013  . Palpitations 04/15/2012  . COLONIC POLYPS, ADENOMATOUS, HX OF 09/18/2009  . PULMONARY NODULE 12/20/2008  . GANGLION CYST 10/04/2007  . Mixed incontinence 04/28/2007  . Hyperlipidemia 04/27/2007  . Depression with anxiety 04/27/2007  . Asthma, mild intermittent 04/27/2007  . INSOMNIA 04/27/2007  . ADENOMATOUS COLONIC POLYP 11/04/2006  . Hypothyroidism 09/02/2006  . Atrial  fibrillation, chronic ([place]) 08/05/2006   Past Medical History:  Diagnosis Date  . Allergic rhinitis   . 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 Anemia   . Arthritis   . Atrial fibrillation -persistent cardiologist-  dr klein/  primary EP -- dr Tawanna Sat ([facility])   a. s/p PVI [name] 2010;  b. on tikosyn/coumadin;  c. 05/2009 Echo: EF 60-65%, Gr 2 DD. (first dx 09/ 2007)  . Bilateral lower extremity edema   . Bleeding hemorrhoid   . Carotid stenosis    mild (hosp 3/11)- consult by vasc/ Dr Donnetta Hutching  . Complication of anesthesia    hard to wake  . Diverticulosis of colon   . Dyspnea    on exertion-climbing stairs  . Fatty liver   . H/O cardiac radiofrequency ablation    01/ 2008 at [facility] /  03/ 2010  at [facility]  . Heart failure with preserved ejection fraction ([place])   . History of adenomatous polyp of colon    tubular adenoma's  . History of cardiomyopathy    secondary tachycardia-induced cardiomyopathy -- resolved 2014  . History of squamous cell carcinoma in situ (SCCIS) of skin    05/ 2017  nasal bridge and right medial knee  . History of transient ischemic attack (TIA)    01-24-2005 and 06-12-2009  . Hyperlipidemia   . Hypothyroidism   . Mild intermittent asthma    reacts to cats  . Mixed stress and urge urinary incontinence   . Presence of permanent cardiac pacemaker    was put in 07/2019  . Pulmonary nodule   . S/P AV nodal ablation 07/28/19 07/29/2019  . S/P mitral valve repair 10-23-1998  dr Boyce Medici at [facility]   for MVP and regurg. (annuloplasty ring procedure)  . S/P placement of cardiac pacemaker MDT 07/28/19 07/29/2019   Past Surgical History:  Procedure Laterality Date  . APPENDECTOMY  1978  . AV NODE ABLATION N/A 07/28/2019   Procedure: AV NODE ABLATION;  Surgeon: Deboraha Sprang, MD;  Location: [place] [facility];  Service: Cardiovascular;  Laterality: N/A;  . BUBBLE STUDY  06/19/2019   Procedure: BUBBLE STUDY;  Surgeon: Pixie Casino, MD;  Location:  [facility] ENDOSCOPY;  Service: Cardiovascular;;  . CARDIAC ELECTROPHYSIOLOGY [place]  01/ 2008    at [place]   right-sided ablation atrial flutter  . CARDIAC ELECTROPHYSIOLOGY STUDY AND ABLATION  03/ 2010   dr Jaymes Graff at [facility]   AV node ablation and pulmonary vein isolation for atrial fib  . CARDIOVERSION  06-18-2006;  07-13-2006;  10-19-2010;  10-27-2010  . COLONOSCOPY    . COLONOSCOPY WITH PROPOFOL N/A 10/13/2017   Procedure: COLONOSCOPY WITH PROPOFOL;  Surgeon: [name], MD;  Location: [facility] ENDOSCOPY;  Service: Gastroenterology;  Laterality: N/A;  . COLONOSCOPY WITH PROPOFOL N/A 02/20/2020   Procedure: COLONOSCOPY WITH PROPOFOL;  Surgeon: Lesly Rubenstein, MD;  Location: [facility] ENDOSCOPY;  Service: Endoscopy;  Laterality: N/A;  . CYSTO/ TRANSURETHRAL COLLAGEN INJECTION THERAPY  07-26-2007   dr Matilde Sprang  . DILATION AND CURETTAGE OF UTERUS    . ESOPHAGOGASTRODUODENOSCOPY (EGD) WITH PROPOFOL N/A 10/13/2017   Procedure: ESOPHAGOGASTRODUODENOSCOPY (EGD) WITH PROPOFOL;  Surgeon: [name], MD;  Location: [facility] ENDOSCOPY;  Service: Gastroenterology;  Laterality: N/A;  . EVALUATION UNDER ANESTHESIA WITH HEMORRHOIDECTOMY N/A 04/09/2020   Procedure: EXAM UNDER ANESTHESIA WITH HEMORRHOIDECTOMY;  Surgeon: Jules Husbands, MD;  Location: [facility] ORS;  Service: General;  Laterality: N/A;  . EXCISIONAL HEMORRHOIDECTOMY  1980s  . GIVENS CAPSULE STUDY N/A 12/08/2017   Procedure: GIVENS CAPSULE STUDY;  Surgeon: [name], MD;  Location: [facility] ENDOSCOPY;  Service: Gastroenterology;  Laterality: N/A;  . HEMORRHOID SURGERY N/A 10/29/2016   Procedure: HEMORRHOIDECTOMY;  Surgeon: Leighton Ruff, MD;  Location: [facility];  Service: General;  Laterality: N/A;  . MITRAL VALVE ANNULOPLASTY  10/23/1998   "Model [id number]; Campbell Lerner [id number]"; size 33mm; [facility]; Dr. Boyce Medici  . PACEMAKER IMPLANT N/A 07/28/2019   Procedure: PACEMAKER IMPLANT;  Surgeon: Deboraha Sprang, MD;  Location:  [place] [facility];  Service: Cardiovascular;  Laterality: N/A;  . [place]  . TEE WITH CARDIOVERSION  05-06-2006 at [facility];  01-02-2013 at [facility]  . TEE WITHOUT CARDIOVERSION N/A 06/19/2019   Procedure: TRANSESOPHAGEAL ECHOCARDIOGRAM (TEE);  Surgeon: Pixie Casino, MD;  Location: [facility] ENDOSCOPY;  Service: Cardiovascular;  Laterality: N/A;  . TOTAL HIP ARTHROPLASTY Left 05/04/2017   Procedure: LEFT TOTAL HIP ARTHROPLASTY ANTERIOR APPROACH;  Surgeon: Mcarthur Rossetti, MD;  Location: [place];  Service: Orthopedics;  Laterality: Left;  . TRANSTHORACIC ECHOCARDIOGRAM  05-01-2015   dr Caryl Comes   ef 50-55%/  mild AV sclerosis without stenosis/  post MV repair with mild central MR (valve area by pressure half-time 2cm^2,  valve area by continutity equation 0.91cm^2, peak grandiant 47mmHg)/  severe LAE/ mild TR/ mild RAE   . TUBAL LIGATION Bilateral 1978   Social History   Tobacco Use  . Smoking status: Never Smoker  . Smokeless tobacco: Never Used  Vaping Use  . Vaping Use: Never used  Substance Use Topics  . Alcohol use: Yes    Alcohol/week: 0.0 standard drinks    Comment: seldom  . Drug use: No   Family History  Problem Relation Age of Onset  . Lung cancer Father        smoker, died at 29  . Alcohol abuse Father   . Cancer Father        bladder and lung CA smoker  . Breast cancer Neg Hx   . Stroke Neg Hx    Allergies  Allergen Reactions  . Amiodarone Hcl Swelling    SWELLING REACTION UNSPECIFIED   . Penicillins Rash    Has patient had a PCN reaction causing immediate rash, facial/tongue/throat swelling, SOB or lightheadedness with hypotension: No Has patient had a PCN reaction causing severe rash involving mucus membranes or skin necrosis: No Has patient had a PCN reaction that required hospitalization:Patient was inpatient when reaction occurred Has patient had a PCN reaction occurring within the last 10 years: No If all of the above answers are "NO", then may  proceed with Cephalosporin use.   . Statins Rash   Current Outpatient Medications on File Prior to Visit  Medication Sig Dispense Refill  . acetaminophen (TYLENOL) 500 MG tablet Take 500 mg by mouth every 6 (six) hours as needed for moderate pain.    [name] alendronate (FOSAMAX) 70 MG tablet Take 1 tablet (70 mg total) by mouth  every 7 (seven) days. Take with a full glass of water on an empty stomach. (Patient taking differently: Take 70 mg by mouth every Sunday. Take with a full glass of water on an empty stomach.) 1 tablet 0  . Ascorbic Acid (VITAMIN C) 1000 MG tablet Take 1,000 mg by mouth daily.    . diclofenac Sodium (VOLTAREN) 1 % GEL Apply 1 application topically 4 (four) times daily as needed (pain).    [name] diphenhydrAMINE (BENADRYL) 25 mg capsule Take 25 mg by mouth every 6 (six) hours as needed for itching.    . diphenhydrAMINE-zinc acetate (BENADRYL) cream Apply 1 application topically 3 (three) times daily as needed for itching.    . ezetimibe (ZETIA) 10 MG tablet Take 1 tablet (10 mg total) by mouth daily. 90 tablet 3  . ferrous sulfate 325 (65 FE) MG EC tablet TAKE 1 TABLET EVERY DAY WITH BREAKFAST (Patient taking differently: Take 325 mg by mouth daily.) 90 tablet 3  . fluticasone (FLONASE) 50 MCG/ACT nasal spray Place 1 spray into both nostrils daily as needed for allergies.    . furosemide (LASIX) 40 MG tablet Take 40 mg by mouth daily as needed for edema.    [name] HYDROcodone-acetaminophen (NORCO/VICODIN) 5-325 MG tablet Take 1-2 tablets by mouth every 4 (four) hours as needed for moderate pain. 30 tablet 0  . Ketotifen Fumarate (EYE ITCH RELIEF OP) Place 1 drop into both eyes daily as needed (allergies).    [name] levothyroxine (SYNTHROID) 50 MCG tablet Take 1 tablet (50 mcg total) by mouth daily. 90 tablet 3  . Polyethyl Glycol-Propyl Glycol 0.4-0.3 % SOLN Place 1-2 drops into both eyes 3 (three) times daily as needed (for dry eyes.).    [name] warfarin (COUMADIN) 5 MG tablet Take 1 tablet (5 mg  total) by mouth daily. Take 1 tablet by mouth once daily or as directed by Coumadin clinic 30 tablet 1   No current facility-administered medications on file prior to visit.     Review of Systems  Constitutional: Negative for activity change, appetite change, fatigue, fever and unexpected weight change.  HENT: Negative for congestion, ear pain, rhinorrhea, sinus pressure and sore throat.   Eyes: Negative for pain, redness and visual disturbance.  Respiratory: Negative for cough, shortness of breath and wheezing.   Cardiovascular: Negative for chest pain and palpitations.  Gastrointestinal: Negative for abdominal pain, blood in stool, constipation and diarrhea.       Hemorrhoids   Endocrine: Negative for polydipsia and polyuria.  Genitourinary: Positive for pelvic pain. Negative for decreased urine volume, difficulty urinating, dysuria, frequency, hematuria, urgency and vaginal bleeding.  Musculoskeletal: Negative for arthralgias, back pain and myalgias.  Skin: Negative for pallor and rash.  Allergic/Immunologic: Negative for environmental allergies.  Neurological: Negative for dizziness, syncope and headaches.  Hematological: Negative for adenopathy. Does not bruise/bleed easily.  Psychiatric/Behavioral: Negative for decreased concentration and dysphoric mood. The patient is not nervous/anxious.        Objective:   Physical Exam Constitutional:      General: She is not in acute distress.    Appearance: Normal appearance. She is well-developed, normal weight and well-nourished. She is not ill-appearing or diaphoretic.  HENT:     Head: Normocephalic and atraumatic.     Mouth/Throat:     Mouth: Oropharynx is clear and moist.  Eyes:     General: No scleral icterus.    Extraocular Movements: EOM normal.     Conjunctiva/sclera: Conjunctivae normal.     Pupils:  Pupils are equal, round, and reactive to light.  Cardiovascular:     Rate and Rhythm: Normal rate and regular rhythm.      Heart sounds: Normal heart sounds.  Pulmonary:     Effort: Pulmonary effort is normal. No respiratory distress.     Breath sounds: Normal breath sounds. No wheezing or rales.  Abdominal:     General: Bowel sounds are normal. There is no distension.     Palpations: Abdomen is soft. There is no mass.     Tenderness: There is no abdominal tenderness. There is no guarding or rebound.  Genitourinary:    Comments:         Anus appears normal w/o hemorrhoids or masses     External genitalia : nl appearance and hair distribution/no lesions     Urethral meatus : nl size, no lesions or prolapse     Urethra: no tenderness or scarring, caruncle noted red in color    Bladder : no masses or tenderness     Vagina: nl general appearance, no discharge or  Lesions, no significant cystocele  or rectocele     Cervix: no lesions/ discharge or friability    Uterus: nl size, contour, position, and mobility (not fixed) , non tender    Adnexa : no masses, tenderness, enlargement or nodularity      pelvic pain is not reproduced on exam today Musculoskeletal:     Cervical back: Normal range of motion and neck supple.     Right lower leg: No edema.     Left lower leg: No edema.  Lymphadenopathy:     Cervical: No cervical adenopathy.  Skin:    General: Skin is warm and dry.     Coloration: Skin is not pale.     Findings: No erythema or rash.  Neurological:     Mental Status: She is alert.  Psychiatric:        Mood and Affect: Mood and affect and mood normal.           Assessment & Plan:   Problem List Items Addressed This Visit      Cardiovascular and Mediastinum   Atrial fibrillation, chronic (HCC)    Pt is now back on warfarin (prev eliquis) and her bleeding hemorrhoids have improved      Hemorrhoids    S/p surgery  Bleeding finally slowed post op when she changed from eliquis to warfarin         Digestive   Rectal bleeding     Endocrine   Hypothyroidism     Re check TSH today on 50 mcg of levothyroxine  No clinical changes       Relevant Orders   TSH (Completed)     Genitourinary   Urethral caruncle    Noted on exam -not painful /pt unaware Pt also has h/o urinary incontinence  Ref to urology  Past h/o collagen injections (not helpful) years ago)         Other   Mixed incontinence    With h/o collagen urethral injection in the past  Interested in further tx options Urethral caruncle noted on exam  Ref to urology      Pelvic pain - Primary    Vague bilateral pelvic pain  Not reproduced on pelvic exam (no tenderness and no mass noted)- reassuring  Planning on urology visit for urethral caruncle and incontinence        Other Visit Diagnoses    Permanent atrial fibrillation ([place])   (  Chronic)

## 2020-04-23 NOTE — Assessment & Plan Note (Signed)
With h/o collagen urethral injection in the past  Interested in further tx options Urethral caruncle noted on exam  Ref to urology

## 2020-04-23 NOTE — Assessment & Plan Note (Signed)
Pt is now back on warfarin (prev eliquis) and her bleeding hemorrhoids have improved

## 2020-04-24 ENCOUNTER — Ambulatory Visit (INDEPENDENT_AMBULATORY_CARE_PROVIDER_SITE_OTHER): Payer: Medicare Other | Admitting: Surgery

## 2020-04-24 ENCOUNTER — Encounter: Payer: Self-pay | Admitting: Surgery

## 2020-04-24 ENCOUNTER — Ambulatory Visit (INDEPENDENT_AMBULATORY_CARE_PROVIDER_SITE_OTHER): Payer: Medicare Other | Admitting: *Deleted

## 2020-04-24 VITALS — BP 142/84 | HR 97 | Temp 98.4°F | Ht 64.0 in | Wt 157.0 lb

## 2020-04-24 DIAGNOSIS — Z09 Encounter for follow-up examination after completed treatment for conditions other than malignant neoplasm: Secondary | ICD-10-CM

## 2020-04-24 DIAGNOSIS — I482 Chronic atrial fibrillation, unspecified: Secondary | ICD-10-CM | POA: Diagnosis not present

## 2020-04-24 DIAGNOSIS — I4891 Unspecified atrial fibrillation: Secondary | ICD-10-CM

## 2020-04-24 DIAGNOSIS — Z5181 Encounter for therapeutic drug level monitoring: Secondary | ICD-10-CM | POA: Diagnosis not present

## 2020-04-24 LAB — POCT INR: INR: 2.1 (ref 2.0–3.0)

## 2020-04-24 NOTE — Patient Instructions (Signed)
If you have any concerns or questions, please feel free to call our office.    

## 2020-04-24 NOTE — Patient Instructions (Addendum)
A full discussion of the nature of anticoagulants has been carried out.  A benefit risk analysis has been presented to the patient, so that they understand the justification for choosing anticoagulation at this time. The need for frequent and regular monitoring, precise dosage adjustment and compliance is stressed.  Side effects of potential bleeding are discussed.  The patient should avoid any OTC items containing aspirin or ibuprofen, and should avoid great swings in general diet.  Avoid alcohol consumption.  Call if any signs of abnormal bleeding.  Description   Resume taking your previous dose of 5mg  daily except 2.5mg  on Mondays and Fridays. Recheck INR in 1 week with [facility].

## 2020-04-26 ENCOUNTER — Ambulatory Visit (INDEPENDENT_AMBULATORY_CARE_PROVIDER_SITE_OTHER): Payer: Medicare Other

## 2020-04-26 ENCOUNTER — Encounter: Payer: Self-pay | Admitting: Surgery

## 2020-04-26 DIAGNOSIS — I482 Chronic atrial fibrillation, unspecified: Secondary | ICD-10-CM | POA: Diagnosis not present

## 2020-04-26 NOTE — Progress Notes (Signed)
S/p hemorrhoidectomy switched to coumadin and intermittent spotting improved significantly Some soreness  PE: no perianal sepsis, hemorrhoidectomy site healing well, no blood  A/P  Doing well RTC prn

## 2020-04-30 ENCOUNTER — Ambulatory Visit (INDEPENDENT_AMBULATORY_CARE_PROVIDER_SITE_OTHER): Payer: Medicare Other

## 2020-04-30 ENCOUNTER — Other Ambulatory Visit: Payer: Self-pay

## 2020-04-30 DIAGNOSIS — Z5181 Encounter for therapeutic drug level monitoring: Secondary | ICD-10-CM

## 2020-04-30 DIAGNOSIS — I482 Chronic atrial fibrillation, unspecified: Secondary | ICD-10-CM

## 2020-04-30 LAB — POCT INR: INR: 1.9 — AB (ref 2.0–3.0)

## 2020-04-30 NOTE — Progress Notes (Signed)
Remote pacemaker transmission.   

## 2020-04-30 NOTE — Patient Instructions (Addendum)
Pre visit review using our clinic review tool, if applicable. No additional management support is needed unless otherwise documented below in the visit note.  Increase dose today to 7.5 mg and then continue 5mg  daily except 2.5mg  on Mondays and Fridays. Recheck INR in 1 week with [facility].

## 2020-05-08 ENCOUNTER — Other Ambulatory Visit: Payer: Self-pay | Admitting: Internal Medicine

## 2020-05-08 DIAGNOSIS — Z7901 Long term (current) use of anticoagulants: Secondary | ICD-10-CM

## 2020-05-08 NOTE — Progress Notes (Signed)
Cardiology Office Note Date:  05/08/2020  Patient ID:  [name], [name] 1933/03/20, MRN [id number] PCP:  Abner Greenspan, MD  Cardiologist:  Dr. Caryl Comes   Chief Complaint:   planned f/u  History of Present Illness: [name] is a 87 y.o. female with history of permanent AFib, hypothyroidism, asthma, HLD, TIA, CVA, AFlutter w/ ablation related to prior atriotomy undertaken at [facility] in March 2010, VHD s/p MV repair (2020), remote NICM with recovered LVEF, permanent Afib, >> Avnode ablation and PPM  I saw her June 2021 She comes in today to be seen for Dr. Caryl Comes, last seen by him 07/06/19, at that visit noted significant reduction in exertional capacity post stroke more DOE, and more despondent in general, seems she mentioned life not worth living feeling so poorly. She was planned for AVnode ablation, PPM hoping the reduction in meds/fatigue associated with them and better HR control would improve quality of life.. She feels markedly improved from pre-pacer/AV node ablation.  She has had a clear improvement in her exertional capacity.  Not quite whee she would like to be, but much better.  No CP, palpitations. No near syncope or syncope, but will get what she describes as a sense of heaviness that comes over her body that is a perhaps weak feeling.  It is generalized sensation, random, and fairly fleeting. She has this sensation while here for a few moments, She was seated, happened to still be on the programmer with VP at 70 and her BP was 110/60 She is tolerating the Eliquis well, no bleeding or signs of bleeding. She denied a formal dx of HTN and was hoping to reduce her medication load, planned to wean off her metoprolol and asked to monitor her BP.  F/u with Dr. Caryl Comes Aug 2021, was doing well, ASA stopped, eliquis continued.  She was hospitalized 02/18/2020 w/BRBPR, underwent colonoscopy on 02/20/2020 with findings of mild diverticulosis, polyps and hemorrhoids.  Bleeding was thought to be  secondary to hemorrhoids exacerbated by Eliquis.  Per GI she can restart Eliquis from tomorrow.  Hemoglobin remained stable. There was mention in her d/c summary, pt was worried about bleeding with Eliquis, and inquired about warfarin, stating she had no problems with warfarin, and recommended to discuss with her cardiologist team.  I saw her 03/08/20 She saw GI yesterday and has been referred to a surgeon regarding her hemorrhoids. She is frustrated, was not aware or told until yesterday that she had hemorrhoids and did not note that in her record either. She had one day of BRBPR one day since home, none further, though feels like it was not trivial and is very weary of the Eliquis. Says someone here told her to resume it but once daily and follow up with me today. She is quite worried about her high BP of late, this has never been a problem for her She has not had CP, plapitations or cardiac awareness, no SOB, no dizziness, near syncope or syncope. BP manually checked by myself was good and encouraged her to invest in a home cuff if she was able. Discussed daily eliquis was not therapeutic and advised to follow up with the surgeon regarding her hemorrhoids and continue with the Eliquis BID. She asked about changing back to warfarin,  I was not in favor of Warfarin given she had a stroke when her warfarin got subtherapeutic.  Subsequently via phone notes, was changed to warfarin via Dr. Caryl Comes.  She also has had hemorrhoidectomy and  had surgical follow up doing well.  TODAY She is doing well No CP, palpitations or cardiac awareness. She will infrequently feel a pinch/poke at the edge of her PPM, this since the day of implant, is infrequent. No near syncope or syncope, last week had a "touch of vertigo", self resolved.  Mentions some slight dizziness upon standing, not regularly. No further bleeding since changing to warfarin and having hemorrhoid surgery.  She is very happy to be back on  warfarin  AF history AAD hx Tikosyn d/c with progression to permanent AF 07/28/2019 > AV node ablation CVA 2/2 subtherapeutic INR >> ASA and Eliquis  >>> eliquis alone  Device information MDT single chamber PPM implanted 07/28/2019 Is s/p AV node ablation  Past Medical History:  Diagnosis Date  . Allergic rhinitis   . 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 Anemia   . Arthritis   . Atrial fibrillation -persistent cardiologist-  dr klein/  primary EP -- dr Tawanna Sat ([facility])   a. s/p PVI [name] 2010;  b. on tikosyn/coumadin;  c. 05/2009 Echo: EF 60-65%, Gr 2 DD. (first dx 09/ 2007)  . Bilateral lower extremity edema   . Bleeding hemorrhoid   . Carotid stenosis    mild (hosp 3/11)- consult by vasc/ Dr Donnetta Hutching  . Complication of anesthesia    hard to wake  . Diverticulosis of colon   . Dyspnea    on exertion-climbing stairs  . Fatty liver   . H/O cardiac radiofrequency ablation    01/ 2008 at [facility] /  03/ 2010  at [facility]  . Heart failure with preserved ejection fraction ([place])   . History of adenomatous polyp of colon    tubular adenoma's  . History of cardiomyopathy    secondary tachycardia-induced cardiomyopathy -- resolved 2014  . History of squamous cell carcinoma in situ (SCCIS) of skin    05/ 2017  nasal bridge and right medial knee  . History of transient ischemic attack (TIA)    01-24-2005 and 06-12-2009  . Hyperlipidemia   . Hypothyroidism   . Mild intermittent asthma    reacts to cats  . Mixed stress and urge urinary incontinence   . Presence of permanent cardiac pacemaker    was put in 07/2019  . Pulmonary nodule   . S/P AV nodal ablation 07/28/19 07/29/2019  . S/P mitral valve repair 10-23-1998  dr Boyce Medici at [facility]   for MVP and regurg. (annuloplasty ring procedure)  . S/P placement of cardiac pacemaker MDT 07/28/19 07/29/2019    Past Surgical History:  Procedure Laterality Date  . APPENDECTOMY  1978  . AV NODE ABLATION N/A 07/28/2019   Procedure:  AV NODE ABLATION;  Surgeon: Deboraha Sprang, MD;  Location: [place] CV LAB;  Service: Cardiovascular;  Laterality: N/A;  . BUBBLE STUDY  06/19/2019   Procedure: BUBBLE STUDY;  Surgeon: Pixie Casino, MD;  Location: [facility] ENDOSCOPY;  Service: Cardiovascular;;  . CARDIAC ELECTROPHYSIOLOGY [place]  01/ 2008    at [place]   right-sided ablation atrial flutter  . CARDIAC ELECTROPHYSIOLOGY STUDY AND ABLATION  03/ 2010   dr Jaymes Graff at [facility]   AV node ablation and pulmonary vein isolation for atrial fib  . CARDIOVERSION  06-18-2006;  07-13-2006;  10-19-2010;  10-27-2010  . COLONOSCOPY    . COLONOSCOPY WITH PROPOFOL N/A 10/13/2017   Procedure: COLONOSCOPY WITH PROPOFOL;  Surgeon: [name], MD;  Location: [facility] ENDOSCOPY;  Service: Gastroenterology;  Laterality: N/A;  . COLONOSCOPY WITH PROPOFOL N/A 02/20/2020   Procedure: COLONOSCOPY WITH PROPOFOL;  Surgeon: Lesly Rubenstein, MD;  Location: [facility] ENDOSCOPY;  Service: Endoscopy;  Laterality: N/A;  . CYSTO/ TRANSURETHRAL COLLAGEN INJECTION THERAPY  07-26-2007   dr Matilde Sprang  . DILATION AND CURETTAGE OF UTERUS    . ESOPHAGOGASTRODUODENOSCOPY (EGD) WITH PROPOFOL N/A 10/13/2017   Procedure: ESOPHAGOGASTRODUODENOSCOPY (EGD) WITH PROPOFOL;  Surgeon: [name], MD;  Location: [facility] ENDOSCOPY;  Service: Gastroenterology;  Laterality: N/A;  . EVALUATION UNDER ANESTHESIA WITH HEMORRHOIDECTOMY N/A 04/09/2020   Procedure: EXAM UNDER ANESTHESIA WITH HEMORRHOIDECTOMY;  Surgeon: Jules Husbands, MD;  Location: [facility] ORS;  Service: General;  Laterality: N/A;  . EXCISIONAL HEMORRHOIDECTOMY  1980s  . GIVENS CAPSULE STUDY N/A 12/08/2017   Procedure: GIVENS CAPSULE STUDY;  Surgeon: [name], MD;  Location: [facility] ENDOSCOPY;  Service: Gastroenterology;  Laterality: N/A;  . HEMORRHOID SURGERY N/A 10/29/2016   Procedure: HEMORRHOIDECTOMY;  Surgeon: Leighton Ruff, MD;  Location: [facility];  Service: General;  Laterality: N/A;  .  MITRAL VALVE ANNULOPLASTY  10/23/1998   "Model [id number]; [employer] [id number]"; size 58mm; [facility]; Dr. Boyce Medici  . PACEMAKER IMPLANT N/A 07/28/2019   Procedure: PACEMAKER IMPLANT;  Surgeon: Deboraha Sprang, MD;  Location: [place] CV LAB;  Service: Cardiovascular;  Laterality: N/A;  . [place]  . TEE WITH CARDIOVERSION  05-06-2006 at [facility];  [facility]02-2013 [facility]  . TEE WITHOUT CARDIOVERSION N/A 06/19/2019   Procedure: TRANSESOPHAGEAL ECHOCARDIOGRAM (TEE);  Surgeon: Pixie Casino, MD;  Location: [facility] ENDOSCOPY;  Service: Cardiovascular;  Laterality: N/A;  . TOTAL HIP ARTHROPLASTY Left 05/04/2017   Procedure: LEFT TOTAL HIP ARTHROPLASTY ANTERIOR APPROACH;  Surgeon: Mcarthur Rossetti, MD;  Location: [place];  Service: Orthopedics;  Laterality: Left;  . TRANSTHORACIC ECHOCARDIOGRAM  05-01-2015   dr Caryl Comes   ef 50-55%/  mild AV sclerosis without stenosis/  post MV repair with mild central MR (valve area by pressure half-time 2cm^2,  valve area by continutity equation 0.91cm^2, peak grandiant 53mmHg)/  severe LAE/ mild TR/ mild RAE   . TUBAL LIGATION Bilateral 1978    Current Outpatient Medications  Medication Sig Dispense Refill  . acetaminophen (TYLENOL) 500 MG tablet Take 500 mg by mouth every 6 (six) hours as needed for moderate pain.    [name] alendronate (FOSAMAX) 70 MG tablet Take 1 tablet (70 mg total) by mouth every 7 (seven) days. Take with a full glass of water on an empty stomach. (Patient taking differently: Take 70 mg by mouth every Sunday. Take with a full glass of water on an empty stomach.) 1 tablet 0  . Ascorbic Acid (VITAMIN C) 1000 MG tablet Take 1,000 mg by mouth daily.    . diclofenac Sodium (VOLTAREN) 1 % GEL Apply 1 application topically 4 (four) times daily as needed (pain).    [name] diphenhydrAMINE (BENADRYL) 25 mg capsule Take 25 mg by mouth every 6 (six) hours as needed for itching.    . diphenhydrAMINE-zinc acetate (BENADRYL) cream Apply 1 application topically  3 (three) times daily as needed for itching.    . ezetimibe (ZETIA) 10 MG tablet Take 1 tablet (10 mg total) by mouth daily. 90 tablet 3  . ferrous sulfate 325 (65 FE) MG EC tablet TAKE 1 TABLET EVERY DAY WITH BREAKFAST (Patient taking differently: Take 325 mg by mouth daily.) 90 tablet 3  . fluticasone (FLONASE) 50 MCG/ACT nasal spray Place 1 spray into both nostrils daily as needed for  allergies.    . furosemide (LASIX) 40 MG tablet Take 40 mg by mouth daily as needed for edema.    . Ketotifen Fumarate (EYE ITCH RELIEF OP) Place 1 drop into both eyes daily as needed (allergies).    [name] levothyroxine (SYNTHROID) 50 MCG tablet Take 1 tablet (50 mcg total) by mouth daily. 90 tablet 3  . Polyethyl Glycol-Propyl Glycol 0.4-0.3 % SOLN Place 1-2 drops into both eyes 3 (three) times daily as needed (for dry eyes.).    [name] warfarin (COUMADIN) 5 MG tablet Take 1 tablet (5 mg total) by mouth daily. Take 1 tablet by mouth once daily or as directed by Coumadin clinic 30 tablet 1   No current facility-administered medications for this visit.    Allergies:   Amiodarone hcl, Penicillins, and Statins   Social History:  The patient  reports that she has never smoked. She has never used smokeless tobacco. She reports current alcohol use. She reports that she does not use drugs.   Family History:  The patient's family history includes Alcohol abuse in her father; Cancer in her father; Lung cancer in her father.  ROS:  Please see the history of present illness.  All other systems are reviewed and otherwise negative.   PHYSICAL EXAM:  VS:  There were no vitals taken for this visit. BMI: There is no height or weight on file to calculate BMI. Well nourished, well developed, in no acute distress  HEENT: normocephalic, atraumatic  Neck: no JVD, carotid bruits or masses Cardiac:  RRR; no significant murmurs, no rubs, or gallops Lungs:  CTA b/l, no wheezing, rhonchi or rales  Abd: soft, nontender MS: no deformity or  atrophy Ext: several superficial spider veins, trace edema  Skin: warm and dry, no rash Neuro:  No gross deficits appreciated Psych: euthymic mood, full affect  PPM site: stable, no tethering, skin changes   EKG:  Not done today  PPM interrogation done today and reviewed by myself Battery and lead measurements are good A port plugged Battery and lead measurements are goo NSVT x3, most recent 124/22, 2 seconds, also the longest. VP at 40 today  05/01/15: TTE Study Conclusions - Left ventricle: Septal and apical hypokinesis The cavity size was   normal. Wall thickness was normal. Systolic function was normal.   The estimated ejection fraction was in the range of 50% to 55%. - Mitral valve: Post mitral valve repair with mild central MR. - Left atrium: The atrium was severely dilated. - Right atrium: The atrium was mildly dilated. - Atrial septum: No defect or patent foramen ovale was identified.   06/19/2019: TEE IMPRESSIONS  1. Left ventricular ejection fraction, by estimation, is 55 to 60%. The  left ventricle has normal function. The left ventricle has no regional  wall motion abnormalities. There is mild left ventricular hypertrophy.  2. Right ventricular systolic function is normal. The right ventricular  size is normal.  3. Left atrial size was severely dilated. No left atrial/left atrial  appendage thrombus was detected.  4. The mitral valve has been repaired/replaced. Mild to moderate mitral  valve regurgitation. Mild mitral stenosis. There is a prosthetic  annuloplasty ring present in the mitral position. Procedure Date:  10/23/1998.  5. The tricuspid valve is abnormal. Tricuspid valve regurgitation is  moderate.  6. The aortic valve is tricuspid. Aortic valve regurgitation is not  visualized.  7. Late microbubble contrast suggestive of intrahepatic or intrapulmonary  shunt.     06/16/2019: TTE IMPRESSIONS  1. Left ventricular ejection fraction, by  estimation, is 55 to 60%. The  left ventricle has normal function. The left ventricle has no regional  wall motion abnormalities. Left ventricular diastolic function could not  be evaluated.  2. Right ventricular systolic function is normal. The right ventricular  size is normal. There is mildly elevated pulmonary artery systolic  pressure. The estimated right ventricular systolic pressure is 62.2 mmHg.  3. Left atrial size was severely dilated.  4. A mitral valve annuloplasty ring is present. There is a heavily  calcified focal mass-like structure present on the PMVL. This is likely  deterioration of the PMVL with mitral annular calcification. There are no  mobile components to it. There is no  apparent destruction of the valve. There is mild to moderate MR. There is  mild mitral stenosis. This is best viewed in the PLAX. When compared with  the prior echo this was present but not specifically commented on. The  mitral valve has been  repaired/replaced. Mild to moderate mitral valve regurgitation. The mean  mitral valve gradient is 6.3 mmHg with average heart rate of 80 bpm. There  is a prosthetic annuloplasty ring present in the mitral position.  Procedure Date: 10/23/1998.  5. The aortic valve is tricuspid. Aortic valve regurgitation is not  visualized. Mild aortic valve sclerosis is present, with no evidence of  aortic valve stenosis.  6. The inferior vena cava is dilated in size with <50% respiratory  variability, suggesting right atrial pressure of 15 mmHg.   Comparison(s): A prior study was performed on 12/01/2016. No significant  change from prior study.     Recent Labs: 03/04/2020: ALT 12; BUN 24; Creatinine, Ser 0.79; Hemoglobin 12.4; Platelets 168.0; Potassium 5.0; Sodium 144 04/23/2020: TSH 3.41  03/04/2020: Cholesterol 178; HDL 37.80; LDL Cholesterol 125; Total CHOL/HDL Ratio 5; Triglycerides 76.0; VLDL 15.2   CrCl cannot be calculated (Patient's most recent lab  result is older than the maximum 21 days allowed.).   Wt Readings from Last 3 Encounters:  04/24/20 157 lb (71.2 kg)  04/23/20 157 lb 6 oz (71.4 kg)  04/15/20 159 lb 6.4 oz (72.3 kg)     Other studies reviewed: Additional studies/records reviewed today include: summarized above  ASSESSMENT AND PLAN:  1. Permanent AFib > AV node ablation     CHA2DS2Vasc is 6, on warfarin, monitored and managed here  Discussed importance of close management of her INR and not getting subtherapeutic.   2. PPM s/p AVNode ablation     Intact function     No programming changes made     Dependent at 40bpm today  3. HTN     No changes today  4. NSVT(3 total)     Longest  2 seconds last month     Normal LVEF      BMET/Mag today  Disposition: remotes as usual and see her back in 65mo otherwise, sooner if needed   Current medicines are reviewed at length with the patient today.  The patient did not have any concerns regarding medicines.  Haywood Lasso, PA-C 05/08/2020 1:20 PM     [address] [place] [address] [phone] (office)  [phone] (fax)

## 2020-05-08 NOTE — Telephone Encounter (Signed)
Pharmacy requests refill on: Warfarin 5 mg   LAST REFILL: 04/15/2020 (Q-30, R-1) LAST OV: 04/30/2020 NEXT OV: 05/09/2020 PHARMACY: [facility]   Will address with patient tomorrow at INR check.

## 2020-05-09 ENCOUNTER — Ambulatory Visit (INDEPENDENT_AMBULATORY_CARE_PROVIDER_SITE_OTHER): Payer: Medicare Other

## 2020-05-09 ENCOUNTER — Other Ambulatory Visit: Payer: Self-pay

## 2020-05-09 DIAGNOSIS — Z7901 Long term (current) use of anticoagulants: Secondary | ICD-10-CM

## 2020-05-09 LAB — POCT INR: INR: 2.5 (ref 2.0–3.0)

## 2020-05-09 NOTE — Patient Instructions (Addendum)
Pre visit review using our clinic review tool, if applicable. No additional management support is needed unless otherwise documented below in the visit note.  Continue 5mg  daily except 2.5mg  on Mondays and Fridays. Recheck INR in 2 week

## 2020-05-09 NOTE — Telephone Encounter (Signed)
Pt was in today for coumadin clinic and reported occasional vertigo for a while, she could not pinpoint when it started. She denies any syncope events and states "it is just a little vertigo and not very much and is probably because of my ears. I have been having a lot of sinus drainage." She was concerned this is a sign of stroke. Advised this is not hallmark sign of stroke. Pt denied any other symptoms. Assessment for stroke was negative. Pt could not pinpoint exactly when the vertigo occurs. Advised this could occur with hypotension or low blood sugar. Pt reports she is eating minimal to try to loose the last few pound she is having difficulty losing. Pt reports she is going out of town on 2/21 and does not want an apt with PCP. Advised pt to try to eat more snacks in between meals and see if this helps as well as increase water intake. Advised if this worsens or she has any other symptoms, which pt is currently denying any other symptoms, to contact the office or go to the ER or UC if she is out of town. Pt said she knows when to call 911 and would not hesitate to call.

## 2020-05-09 NOTE — Telephone Encounter (Signed)
Noted  

## 2020-05-13 ENCOUNTER — Encounter: Payer: Self-pay | Admitting: Physician Assistant

## 2020-05-13 ENCOUNTER — Ambulatory Visit (INDEPENDENT_AMBULATORY_CARE_PROVIDER_SITE_OTHER): Payer: Medicare Other | Admitting: Physician Assistant

## 2020-05-13 ENCOUNTER — Other Ambulatory Visit: Payer: Self-pay

## 2020-05-13 VITALS — BP 138/72 | HR 87 | Ht 64.0 in | Wt 157.2 lb

## 2020-05-13 DIAGNOSIS — I10 Essential (primary) hypertension: Secondary | ICD-10-CM

## 2020-05-13 DIAGNOSIS — I472 Ventricular tachycardia: Secondary | ICD-10-CM

## 2020-05-13 DIAGNOSIS — I4821 Permanent atrial fibrillation: Secondary | ICD-10-CM | POA: Diagnosis not present

## 2020-05-13 DIAGNOSIS — I4430 Unspecified atrioventricular block: Secondary | ICD-10-CM

## 2020-05-13 DIAGNOSIS — Z950 Presence of cardiac pacemaker: Secondary | ICD-10-CM

## 2020-05-13 DIAGNOSIS — Z79899 Other long term (current) drug therapy: Secondary | ICD-10-CM | POA: Diagnosis not present

## 2020-05-13 DIAGNOSIS — I4729 Other ventricular tachycardia: Secondary | ICD-10-CM

## 2020-05-13 LAB — CUP PACEART REMOTE DEVICE CHECK
Battery Remaining Longevity: 134 mo
Battery Voltage: 3.12 V
Brady Statistic AP VP Percent: 0 %
Brady Statistic AP VS Percent: 0 %
Brady Statistic AS VP Percent: 98.21 %
Brady Statistic AS VS Percent: 1.79 %
Brady Statistic RA Percent Paced: 0 %
Brady Statistic RV Percent Paced: 98.21 %
Date Time Interrogation Session: 20220203220607
Implantable Lead Implant Date: 20210507
Implantable Lead Location: 753860
Implantable Lead Model: 5076
Implantable Pulse Generator Implant Date: 20210507
Lead Channel Impedance Value: 3306 Ohm
Lead Channel Impedance Value: 3306 Ohm
Lead Channel Impedance Value: 456 Ohm
Lead Channel Impedance Value: 532 Ohm
Lead Channel Pacing Threshold Amplitude: 0.5 V
Lead Channel Pacing Threshold Pulse Width: 0.4 ms
Lead Channel Sensing Intrinsic Amplitude: 1.875 mV
Lead Channel Sensing Intrinsic Amplitude: 8.75 mV
Lead Channel Sensing Intrinsic Amplitude: 8.75 mV
Lead Channel Setting Pacing Amplitude: 2.5 V
Lead Channel Setting Pacing Pulse Width: 0.4 ms
Lead Channel Setting Sensing Sensitivity: 4 mV

## 2020-05-13 LAB — BASIC METABOLIC PANEL
BUN/Creatinine Ratio: 32 — ABNORMAL HIGH (ref 12–28)
BUN: 22 mg/dL (ref 8–27)
CO2: 23 mmol/L (ref 20–29)
Calcium: 9.3 mg/dL (ref 8.7–10.3)
Chloride: 103 mmol/L (ref 96–106)
Creatinine, Ser: 0.68 mg/dL (ref 0.57–1.00)
GFR calc Af Amer: 92 mL/min/{1.73_m2} (ref >59)
GFR calc non Af Amer: 80 mL/min/{1.73_m2} (ref >59)
Glucose: 93 mg/dL (ref 65–99)
Potassium: 4.6 mmol/L (ref 3.5–5.2)
Sodium: 142 mmol/L (ref 134–144)

## 2020-05-13 LAB — MAGNESIUM: Magnesium: 2.3 mg/dL (ref 1.6–2.3)

## 2020-05-13 NOTE — Patient Instructions (Addendum)
Medication Instructions:   Your physician recommends that you continue on your current medications as directed. Please refer to the Current Medication list given to you today.   *If you need a refill on your cardiac medications before your next appointment, please call your pharmacy*   Lab Work:  BMET AND [place]   If you have labs (blood work) drawn today and your tests are completely normal, you will receive your results only by: [name] MyChart Message (if you have MyChart) OR . A paper copy in the mail If you have any lab test that is abnormal or we need to change your treatment, we will call you to review the results.   Testing/Procedures: NONE ORDERED  TODAY   Follow-Up: At [facility], you and your health needs are our priority.  As part of our continuing mission to provide you with exceptional heart care, we have created designated Provider Care Teams.  These Care Teams include your primary Cardiologist (physician) and Advanced Practice Providers (APPs -  Physician Assistants and Nurse Practitioners) who all work together to provide you with the care you need, when you need it.  We recommend signing up for the patient portal called "MyChart".  Sign up information is provided on this After Visit Summary.  MyChart is used to connect with patients for Virtual Visits (Telemedicine).  Patients are able to view lab/test results, encounter notes, upcoming appointments, etc.  Non-urgent messages can be sent to your provider as well.   To learn more about what you can do with MyChart, go to [email or web address].    Your next appointment:   6 month(s)  The format for your next appointment:   In Person  Provider:   You may see Dr. Caryl Comes  or one of the following Advanced Practice Providers on your designated Care Team:    Chanetta Marshall, NP  Tommye Standard, PA-C  Legrand Como "Oda Kilts, [place]    Other Instructions

## 2020-05-21 ENCOUNTER — Other Ambulatory Visit: Payer: Self-pay

## 2020-05-21 ENCOUNTER — Ambulatory Visit (INDEPENDENT_AMBULATORY_CARE_PROVIDER_SITE_OTHER): Payer: Medicare Other

## 2020-05-21 DIAGNOSIS — Z7901 Long term (current) use of anticoagulants: Secondary | ICD-10-CM | POA: Diagnosis not present

## 2020-05-21 LAB — POCT INR: INR: 1.6 — AB (ref 2.0–3.0)

## 2020-05-21 NOTE — Patient Instructions (Addendum)
Pre visit review using our clinic review tool, if applicable. No additional management support is needed unless otherwise documented below in the visit note.  Increase dose to take 2 tablets (10 mg) today and tomorrow and then continue 5mg  daily except 2.5mg  on Mondays and Fridays. Recheck INR in 2 week with [facility].

## 2020-05-23 ENCOUNTER — Other Ambulatory Visit: Payer: Self-pay

## 2020-05-23 ENCOUNTER — Encounter: Payer: Self-pay | Admitting: Primary Care

## 2020-05-23 ENCOUNTER — Ambulatory Visit (INDEPENDENT_AMBULATORY_CARE_PROVIDER_SITE_OTHER): Payer: Medicare Other | Admitting: Primary Care

## 2020-05-23 VITALS — BP 130/80 | HR 71 | Temp 97.0°F | Ht 64.0 in | Wt 155.5 lb

## 2020-05-23 DIAGNOSIS — R300 Dysuria: Secondary | ICD-10-CM | POA: Diagnosis not present

## 2020-05-23 LAB — POC URINALSYSI DIPSTICK (AUTOMATED)
Bilirubin, UA: NEGATIVE
Glucose, UA: NEGATIVE
Ketones, UA: NEGATIVE
Nitrite, UA: NEGATIVE
Protein, UA: NEGATIVE
Spec Grav, UA: <=1.005 — AB (ref 1.010–1.025)
Urobilinogen, UA: 0.2 E.U./dL
pH, UA: 6 (ref 5.0–8.0)

## 2020-05-23 MED ORDER — NITROFURANTOIN MONOHYD MACRO 100 MG PO CAPS: 100.0000 mg | ORAL_CAPSULE | Freq: Two times a day (BID) | ORAL | 0 refills | Status: AC

## 2020-05-23 NOTE — Progress Notes (Signed)
Subjective:    Patient ID: [name], female    DOB: [birth year], 87 y.o.   MRN: [id number]  HPI  This visit occurred during the SARS-CoV-2 public health emergency.  Safety protocols were in place, including screening questions prior to the visit, additional usage of staff PPE, and extensive cleaning of exam room while observing appropriate contact time as indicated for disinfecting solutions.   [name] is an 87 year old female patient of Dr. Glori Bickers with a history of CVA, elevated glucose readings, urinary incontinence, pelvic pain who presents today with a chief complaint of dysuria.   She follows with urology, has an appointment scheduled with her urologist on 05/27/2020.  Symptoms began about 24 hours ago with dysuria, urinary urgency, urinary frequency. She had been traveling to the [facility] over the last week, returned yesterday.   She denies hematuria, vaginal discharge. She has a history of UTI once before several years ago, this feels similar. She underwent hemorrhoidectomy in mid January 2022, feels mostly recovered from this, has been using cleansing wipes.   Review of Systems  Constitutional: Negative for fever.  Genitourinary: Positive for dysuria, frequency and urgency. Negative for hematuria and vaginal discharge.       Past Medical History:  Diagnosis Date  . Allergic rhinitis   . 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 Anemia   . Arthritis   . Atrial fibrillation -persistent cardiologist-  dr klein/  primary EP -- [facility]aw[facility] Sat ([facility])   a. s/p PVI [name] 2010;  b. on tikosyn/coumadin;  c. 05/2009 Echo: EF 60-65%, Gr 2 DD. (first dx 09/ 2007)  . Bilateral lower extremity edema   . Bleeding hemorrhoid   . Carotid stenosis    mild (hosp 3/11)- consult by vasc/ Dr Donnetta Hutching  . Complication of anesthesia    hard to wake  . Diverticulosis of colon   . Dyspnea    on exertion-climbing stairs  . Fatty liver   . H/O cardiac radiofrequency ablation    01/ 2008 at [place]  [facility] [place] /  03/ 2010  at [facility]  . Heart failure with preserved ejection fraction ([place])   . History of adenomatous polyp of colon    tubular adenoma's  . History of cardiomyopathy    secondary tachycardia-induced cardiomyopathy -- resolved 2014  . History of squamous cell carcinoma in situ (SCCIS) of skin    05/ 2017  nasal bridge and right medial knee  . History of transient ischemic attack (TIA)    01-24-2005 and 06-12-2009  . Hyperlipidemia   . Hypothyroidism   . Mild intermittent asthma    reacts to cats  . Mixed stress and urge urinary incontinence   . Presence of permanent cardiac pacemaker    was put in 07/2019  . Pulmonary nodule   . S/P AV nodal ablation 07/28/19 07/29/2019  . S/P mitral valve repair 10-23-1998  dr Boyce Medici at [facility]   for MVP and regurg. (annuloplasty ring procedure)  . S/P placement of cardiac pacemaker MDT 07/28/19 07/29/2019     Social History   Socioeconomic History  . Marital status: Widowed    Spouse name: Not on file  . Number of children: 6  . Years of education: Not on file  . Highest education level: Not on file  Occupational History  . Occupation: Architectural technologist: RETIRED  Tobacco Use  . Smoking status: Never Smoker  . Smokeless tobacco: Never Used  Vaping Use  . Vaping Use:  Never used  Substance and Sexual Activity  . Alcohol use: Yes    Alcohol/week: 0.0 standard drinks    Comment: seldom  . Drug use: No  . Sexual activity: Not Currently  Other Topics Concern  . Not on file  Social History Narrative   Retired. Daily Caffeine use: 2 daily    Social Determinants of Health   Financial Resource Strain: Low Risk   . Difficulty of Paying Living Expenses: Not hard at all  Food Insecurity: No Food Insecurity  . Worried About Charity fundraiser in the Last Year: Never true  . Ran Out of Food in the Last Year: Never true  Transportation Needs: No Transportation Needs  . Lack of Transportation (Medical): No  . Lack of  Transportation (Non-Medical): No  Physical Activity: Sufficiently Active  . Days of Exercise per Week: 3 days  . Minutes of Exercise per Session: 60 min  Stress: No Stress Concern Present  . Feeling of Stress : Not at all  Social Connections: Not on file  Intimate Partner Violence: Not At Risk  . Fear of Current or Ex-Partner: No  . Emotionally Abused: No  . Physically Abused: No  . Sexually Abused: No    Past Surgical History:  Procedure Laterality Date  . APPENDECTOMY  1978  . AV NODE ABLATION N/A 07/28/2019   Procedure: AV NODE ABLATION;  Surgeon: Deboraha Sprang, MD;  Location: [place] [facility] LAB;  Service: Cardiovascular;  Laterality: N/A;  . BUBBLE STUDY  06/19/2019   Procedure: BUBBLE STUDY;  Surgeon: Pixie Casino, MD;  Location: [facility] ENDOSCOPY;  Service: Cardiovascular;;  . CARDIAC ELECTROPHYSIOLOGY [place]  01/ 2008    at [place]   right-sided ablation atrial flutter  . CARDIAC ELECTROPHYSIOLOGY STUDY AND ABLATION  03/ 2010   dr Jaymes Graff at [facility]   AV node ablation and pulmonary vein isolation for atrial fib  . CARDIOVERSION  06-18-2006;  07-13-2006;  10-19-2010;  10-27-2010  . COLONOSCOPY    . COLONOSCOPY WITH PROPOFOL N/A 10/13/2017   Procedure: COLONOSCOPY WITH PROPOFOL;  Surgeon: [name], MD;  Location: [facility] ENDOSCOPY;  Service: Gastroenterology;  Laterality: N/A;  . COLONOSCOPY WITH PROPOFOL N/A 02/20/2020   Procedure: COLONOSCOPY WITH PROPOFOL;  Surgeon: Lesly Rubenstein, MD;  Location: [facility] ENDOSCOPY;  Service: Endoscopy;  Laterality: N/A;  . CYSTO/ TRANSURETHRAL COLLAGEN INJECTION THERAPY  07-26-2007   dr Matilde Sprang  . DILATION AND CURETTAGE OF UTERUS    . ESOPHAGOGASTRODUODENOSCOPY (EGD) WITH PROPOFOL N/A 10/13/2017   Procedure: ESOPHAGOGASTRODUODENOSCOPY (EGD) WITH PROPOFOL;  Surgeon: [name], MD;  Location: [facility] ENDOSCOPY;  Service: Gastroenterology;  Laterality: N/A;  . EVALUATION UNDER ANESTHESIA WITH HEMORRHOIDECTOMY N/A  04/09/2020   Procedure: EXAM UNDER ANESTHESIA WITH HEMORRHOIDECTOMY;  Surgeon: Jules Husbands, MD;  Location: [facility] ORS;  Service: General;  Laterality: N/A;  . EXCISIONAL HEMORRHOIDECTOMY  1980s  . GIVENS CAPSULE STUDY N/A 12/08/2017   Procedure: GIVENS CAPSULE STUDY;  Surgeon: [name], MD;  Location: [facility] ENDOSCOPY;  Service: Gastroenterology;  Laterality: N/A;  . HEMORRHOID SURGERY N/A 10/29/2016   Procedure: HEMORRHOIDECTOMY;  Surgeon: Leighton Ruff, MD;  Location: [facility];  Service: General;  Laterality: N/A;  . MITRAL VALVE ANNULOPLASTY  10/23/1998   "Model 4625; Campbell Lerner [id number]"; size 38mm; [facility]; Dr. Boyce Medici  . PACEMAKER IMPLANT N/A 07/28/2019   Procedure: PACEMAKER IMPLANT;  Surgeon: Deboraha Sprang, MD;  Location: [place] CV LAB;  Service: Cardiovascular;  Laterality: N/A;  .  PILONIDAL CYST EXCISION  1954  . TEE WITH CARDIOVERSION  05-06-2006 at [facility];  01-02-2013 at [facility]  . TEE WITHOUT CARDIOVERSION N/A 06/19/2019   Procedure: TRANSESOPHAGEAL ECHOCARDIOGRAM (TEE);  Surgeon: Pixie Casino, MD;  Location: [facility] ENDOSCOPY;  Service: Cardiovascular;  Laterality: N/A;  . TOTAL HIP ARTHROPLASTY Left 05/04/2017   Procedure: LEFT TOTAL HIP ARTHROPLASTY ANTERIOR APPROACH;  Surgeon: Mcarthur Rossetti, MD;  Location: [place];  Service: Orthopedics;  Laterality: Left;  . TRANSTHORACIC ECHOCARDIOGRAM  05-01-2015   dr Caryl Comes   ef 50-55%/  mild AV sclerosis without stenosis/  post MV repair with mild central MR (valve area by pressure half-time 2cm^2,  valve area by continutity equation 0.91cm^2, peak grandiant 73mmHg)/  severe LAE/ mild TR/ mild RAE   . TUBAL LIGATION Bilateral 1978    Family History  Problem Relation Age of Onset  . Lung cancer Father        smoker, died at 67  . Alcohol abuse Father   . Cancer Father        bladder and lung CA smoker  . Breast cancer Neg Hx   . Stroke Neg Hx     Allergies  Allergen Reactions  . Amiodarone Hcl Swelling     SWELLING REACTION UNSPECIFIED   . Penicillins Rash    Has patient had a PCN reaction causing immediate rash, facial/tongue/throat swelling, SOB or lightheadedness with hypotension: No Has patient had a PCN reaction causing severe rash involving mucus membranes or skin necrosis: No Has patient had a PCN reaction that required hospitalization:Patient was inpatient when reaction occurred Has patient had a PCN reaction occurring within the last 10 years: No If all of the above answers are "NO", then may proceed with Cephalosporin use.   . Statins Rash    Current Outpatient Medications on File Prior to Visit  Medication Sig Dispense Refill  . acetaminophen (TYLENOL) 500 MG tablet Take 500 mg by mouth every 6 (six) hours as needed for moderate pain.    [name] alendronate (FOSAMAX) 70 MG tablet Take 1 tablet (70 mg total) by mouth every 7 (seven) days. Take with a full glass of water on an empty stomach. (Patient taking differently: Take 70 mg by mouth every Sunday. Take with a full glass of water on an empty stomach.) 1 tablet 0  . Ascorbic Acid (VITAMIN C) 1000 MG tablet Take 1,000 mg by mouth daily.    . diclofenac Sodium (VOLTAREN) 1 % GEL Apply 1 application topically 4 (four) times daily as needed (pain).    [name] diphenhydrAMINE (BENADRYL) 25 mg capsule Take 25 mg by mouth every 6 (six) hours as needed for itching.    . diphenhydrAMINE-zinc acetate (BENADRYL) cream Apply 1 application topically 3 (three) times daily as needed for itching.    . ezetimibe (ZETIA) 10 MG tablet Take 1 tablet (10 mg total) by mouth daily. 90 tablet 3  . ferrous sulfate 325 (65 FE) MG EC tablet TAKE 1 TABLET EVERY DAY WITH BREAKFAST (Patient taking differently: Take 325 mg by mouth daily.) 90 tablet 3  . fluticasone (FLONASE) 50 MCG/ACT nasal spray Place 1 spray into both nostrils daily as needed for allergies.    . furosemide (LASIX) 40 MG tablet Take 40 mg by mouth daily as needed for edema.    . Ketotifen Fumarate  (EYE ITCH RELIEF OP) Place 1 drop into both eyes daily as needed (allergies).    [name] levothyroxine (SYNTHROID) 50 MCG tablet Take 1 tablet (50 mcg total)  by mouth daily. 90 tablet 3  . Polyethyl Glycol-Propyl Glycol 0.4-0.3 % SOLN Place 1-2 drops into both eyes 3 (three) times daily as needed (for dry eyes.).    [name] warfarin (COUMADIN) 5 MG tablet TAKE ONE TABLET BY MOUTH DAILY EXCEPT TAKE 1/2 TABLET ON MONDAYS AND FRIDAYS OR AS DIRECTED BY COUMADIN CLINIC 90 tablet 0   No current facility-administered medications on file prior to visit.    BP 130/80   Pulse 71   Temp (!) 97 F (36.1 C) (Temporal)   Ht 5\' 4"  (1.626 m)   Wt 155 lb 8 oz (70.5 kg)   SpO2 97%   BMI 26.69 kg/m    Objective:   Physical Exam Constitutional:      Appearance: She is well-nourished.  Cardiovascular:     Rate and Rhythm: Normal rate and regular rhythm.  Pulmonary:     Effort: Pulmonary effort is normal.     Breath sounds: Normal breath sounds.  Musculoskeletal:     Cervical back: Neck supple.  Skin:    General: Skin is warm and dry.  Psychiatric:        Mood and Affect: Mood and affect normal.            Assessment & Plan:

## 2020-05-23 NOTE — Assessment & Plan Note (Signed)
Acute for last 24 hours.  UA today with 3+ leuks, 2+ blood. Culture sent. Symptoms coupled with UA results are convincing for UTI.  Penicillin allergy so we will treat with Macrobid 100 mg twice daily x5 days.  She is stable for outpatient treatment.

## 2020-05-23 NOTE — Patient Instructions (Signed)
Start Macrobid (nitrofurantoin) tablets for urinary tract infection. Take 1 tablet by mouth twice daily for five days.  Continue to drink plenty of water.  It was a pleasure meeting you!

## 2020-05-24 LAB — URINE CULTURE
MICRO NUMBER:: 11603596
SPECIMEN QUALITY:: ADEQUATE

## 2020-05-27 ENCOUNTER — Encounter: Payer: Self-pay | Admitting: Urology

## 2020-05-27 ENCOUNTER — Ambulatory Visit (INDEPENDENT_AMBULATORY_CARE_PROVIDER_SITE_OTHER): Payer: Medicare Other | Admitting: Urology

## 2020-05-27 ENCOUNTER — Other Ambulatory Visit: Payer: Self-pay

## 2020-05-27 VITALS — BP 153/79 | HR 87 | Ht 64.0 in | Wt 154.0 lb

## 2020-05-27 DIAGNOSIS — N3946 Mixed incontinence: Secondary | ICD-10-CM | POA: Diagnosis not present

## 2020-05-27 DIAGNOSIS — R32 Unspecified urinary incontinence: Secondary | ICD-10-CM | POA: Diagnosis not present

## 2020-05-27 LAB — BLADDER SCAN AMB NON-IMAGING

## 2020-05-27 NOTE — Progress Notes (Signed)
05/27/2020 8:54 AM   [name] [birth year] [id number]  Referring provider: Abner Greenspan, MD [address] [place],  [place] [address]  Chief Complaint  Patient presents with  . Pelvic Pain  . Urinary Incontinence    HPI: I was consulted to assist the patient is a mild urinary incontinence.  She has little bit urge incontinence and mild bedwetting.  She wears 1 pad a day that is often dry.  No stress incontinence.  She recently had hemorrhoid surgery and secondarily may have had a bladder infection treated.  Normally she gets rare bladder infections  She voids every 1 or 2 hours and gets up once at night.  She had a lot of pain after collagen treatment for stress incontinence years ago   PMH: Past Medical History:  Diagnosis Date  . Allergic rhinitis   . 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 Anemia   . Arthritis   . Atrial fibrillation -persistent cardiologist-  dr klein/  primary EP -- [facility] Tawanna Sat ([facility])   a. s/p PVI [name] 2010;  b. on tikosyn/coumadin;  c. 05/2009 Echo: EF 60-65%, Gr 2 DD. (first dx 09/ 2007)  . Bilateral lower extremity edema   . Bleeding hemorrhoid   . Carotid stenosis    mild (hosp 3/11)- consult by vasc/ Dr Donnetta Hutching  . Complication of anesthesia    hard to wake  . Diverticulosis of colon   . Dyspnea    on exertion-climbing stairs  . Fatty liver   . H/O cardiac radiofrequency ablation    01/ 2008 at [place] [facility] [place] /  03/ 2010  at [facility]  . Heart failure with preserved ejection fraction ([place])   . History of adenomatous polyp of colon    tubular adenoma's  . History of cardiomyopathy    secondary tachycardia-induced cardiomyopathy -- resolved 2014  . History of squamous cell carcinoma in situ (SCCIS) of skin    05/ 2017  nasal bridge and right medial knee  . History of transient ischemic attack (TIA)    01-24-2005 and 06-12-2009  . Hyperlipidemia   . Hypothyroidism   . Mild intermittent asthma    reacts to cats  . Mixed stress and  urge urinary incontinence   . Presence of permanent cardiac pacemaker    was put in 07/2019  . Pulmonary nodule   . S/P AV nodal ablation 07/28/19 07/29/2019  . S/P mitral valve repair 10-23-1998  dr Boyce Medici at [facility]   for MVP and regurg. (annuloplasty ring procedure)  . S/P placement of cardiac pacemaker MDT 07/28/19 07/29/2019    Surgical History: Past Surgical History:  Procedure Laterality Date  . APPENDECTOMY  1978  . AV NODE ABLATION N/A 07/28/2019   Procedure: AV NODE ABLATION;  Surgeon: Deboraha Sprang, MD;  Location: [place] [facility];  Service: Cardiovascular;  Laterality: N/A;  . BUBBLE STUDY  06/19/2019   Procedure: BUBBLE STUDY;  Surgeon: Pixie Casino, MD;  Location: [facility] ENDOSCOPY;  Service: Cardiovascular;;  . CARDIAC ELECTROPHYSIOLOGY [place]  01/ 2008    at [place]   right-sided ablation atrial flutter  . CARDIAC ELECTROPHYSIOLOGY STUDY AND ABLATION  03/ 2010   dr Jaymes Graff at [facility]   AV node ablation and pulmonary vein isolation for atrial fib  . CARDIOVERSION  06-18-2006;  07-13-2006;  10-19-2010;  10-27-2010  . COLONOSCOPY    . COLONOSCOPY WITH PROPOFOL N/A 10/13/2017   Procedure: COLONOSCOPY WITH PROPOFOL;  Surgeon: [name], MD;  Location: [facility] ENDOSCOPY;  Service: Gastroenterology;  Laterality: N/A;  . COLONOSCOPY WITH PROPOFOL N/A 02/20/2020   Procedure: COLONOSCOPY WITH PROPOFOL;  Surgeon: Lesly Rubenstein, MD;  Location: [facility] ENDOSCOPY;  Service: Endoscopy;  Laterality: N/A;  . CYSTO/ TRANSURETHRAL COLLAGEN INJECTION THERAPY  07-26-2007   dr Matilde Sprang  . DILATION AND CURETTAGE OF UTERUS    . ESOPHAGOGASTRODUODENOSCOPY (EGD) WITH PROPOFOL N/A 10/13/2017   Procedure: ESOPHAGOGASTRODUODENOSCOPY (EGD) WITH PROPOFOL;  Surgeon: [name], MD;  Location: [facility] ENDOSCOPY;  Service: Gastroenterology;  Laterality: N/A;  . EVALUATION UNDER ANESTHESIA WITH HEMORRHOIDECTOMY N/A 04/09/2020   Procedure: EXAM UNDER ANESTHESIA WITH  HEMORRHOIDECTOMY;  Surgeon: Jules Husbands, MD;  Location: [facility] ORS;  Service: General;  Laterality: N/A;  . EXCISIONAL HEMORRHOIDECTOMY  1980s  . GIVENS CAPSULE STUDY N/A 12/08/2017   Procedure: GIVENS CAPSULE STUDY;  Surgeon: [name], MD;  Location: [facility] ENDOSCOPY;  Service: Gastroenterology;  Laterality: N/A;  . HEMORRHOID SURGERY N/A 10/29/2016   Procedure: HEMORRHOIDECTOMY;  Surgeon: Leighton Ruff, MD;  Location: [facility];  Service: General;  Laterality: N/A;  . MITRAL VALVE ANNULOPLASTY  10/23/1998   "Model 4625; Campbell Lerner [id number]"; size 19mm; [facility]; Dr. Boyce Medici  . PACEMAKER IMPLANT N/A 07/28/2019   Procedure: PACEMAKER IMPLANT;  Surgeon: Deboraha Sprang, MD;  Location: [place] CV LAB;  Service: Cardiovascular;  Laterality: N/A;  . [name]  . TEE WITH CARDIOVERSION  05-06-2006 at [facility];  01-02-2013 at [facility]  . TEE WITHOUT CARDIOVERSION N/A 06/19/2019   Procedure: TRANSESOPHAGEAL ECHOCARDIOGRAM (TEE);  Surgeon: Pixie Casino, MD;  Location: [facility] ENDOSCOPY;  Service: Cardiovascular;  Laterality: N/A;  . TOTAL HIP ARTHROPLASTY Left 05/04/2017   Procedure: LEFT TOTAL HIP ARTHROPLASTY ANTERIOR APPROACH;  Surgeon: Mcarthur Rossetti, MD;  Location: [place];  Service: Orthopedics;  Laterality: Left;  . TRANSTHORACIC ECHOCARDIOGRAM  05-01-2015   dr Caryl Comes   ef 50-55%/  mild AV sclerosis without stenosis/  post MV repair with mild central MR (valve area by pressure half-time 2cm^2,  valve area by continutity equation 0.91cm^2, peak grandiant 48mmHg)/  severe LAE/ mild TR/ mild RAE   . TUBAL LIGATION Bilateral 1978    Home Medications:  Allergies as of 05/27/2020      Reactions   Amiodarone Hcl Swelling   SWELLING REACTION UNSPECIFIED    Penicillins Rash   Has patient had a PCN reaction causing immediate rash, facial/tongue/throat swelling, SOB or lightheadedness with hypotension: No Has patient had a PCN reaction causing severe rash involving  mucus membranes or skin necrosis: No Has patient had a PCN reaction that required hospitalization:Patient was inpatient when reaction occurred Has patient had a PCN reaction occurring within the last 10 years: No If all of the above answers are "NO", then may proceed with Cephalosporin use.   Statins Rash      Medication List       Accurate as of May 27, 2020  8:54 AM. If you have any questions, ask your nurse or doctor.        acetaminophen 500 MG tablet Commonly known as: TYLENOL Take 500 mg by mouth every 6 (six) hours as needed for moderate pain.   alendronate 70 MG tablet Commonly known as: FOSAMAX Take 1 tablet (70 mg total) by mouth every 7 (seven) days. Take with a full glass of water on an empty stomach. What changed: when to take this   diclofenac Sodium 1 % Gel Commonly known as: VOLTAREN  Apply 1 application topically 4 (four) times daily as needed (pain).   diphenhydrAMINE 25 mg capsule Commonly known as: BENADRYL Take 25 mg by mouth every 6 (six) hours as needed for itching.   diphenhydrAMINE-zinc acetate cream Commonly known as: BENADRYL Apply 1 application topically 3 (three) times daily as needed for itching.   EYE ITCH RELIEF OP Place 1 drop into both eyes daily as needed (allergies).   ezetimibe 10 MG tablet Commonly known as: ZETIA Take 1 tablet (10 mg total) by mouth daily.   ferrous sulfate 325 (65 FE) MG EC tablet TAKE 1 TABLET EVERY DAY WITH BREAKFAST What changed: See the new instructions.   fluticasone 50 MCG/ACT nasal spray Commonly known as: FLONASE Place 1 spray into both nostrils daily as needed for allergies.   furosemide 40 MG tablet Commonly known as: LASIX Take 40 mg by mouth daily as needed for edema.   levothyroxine 50 MCG tablet Commonly known as: SYNTHROID Take 1 tablet (50 mcg total) by mouth daily.   nitrofurantoin (macrocrystal-monohydrate) 100 MG capsule Commonly known as: Macrobid Take 1 capsule (100 mg total) by  mouth 2 (two) times daily for 5 days. For UTI   Polyethyl Glycol-Propyl Glycol 0.4-0.3 % Soln Place 1-2 drops into both eyes 3 (three) times daily as needed (for dry eyes.).   vitamin C 1000 MG tablet Take 1,000 mg by mouth daily.   warfarin 5 MG tablet Commonly known as: COUMADIN Take as directed by the anticoagulation clinic. If you are unsure how to take this medication, talk to your nurse or doctor. Original instructions: TAKE ONE TABLET BY MOUTH DAILY EXCEPT TAKE 1/2 TABLET ON MONDAYS AND FRIDAYS OR AS DIRECTED BY COUMADIN CLINIC       Allergies:  Allergies  Allergen Reactions  . Amiodarone Hcl Swelling    SWELLING REACTION UNSPECIFIED   . Penicillins Rash    Has patient had a PCN reaction causing immediate rash, facial/tongue/throat swelling, SOB or lightheadedness with hypotension: No Has patient had a PCN reaction causing severe rash involving mucus membranes or skin necrosis: No Has patient had a PCN reaction that required hospitalization:Patient was inpatient when reaction occurred Has patient had a PCN reaction occurring within the last 10 years: No If all of the above answers are "NO", then may proceed with Cephalosporin use.   . Statins Rash    Family History: Family History  Problem Relation Age of Onset  . Lung cancer Father        smoker, died at 64  . Alcohol abuse Father   . Cancer Father        bladder and lung CA smoker  . Breast cancer Neg Hx   . Stroke Neg Hx     Social History:  reports that she has never smoked. She has never used smokeless tobacco. She reports current alcohol use. She reports that she does not use drugs.  ROS:                                        Physical Exam: BP (!) 153/79   Pulse 87   Ht 5\' 4"  (1.626 m)   Wt 69.9 kg   BMI 26.43 kg/m   Constitutional:  Alert and oriented, No acute distress. HEENT: [place] AT, moist mucus membranes.  Trachea midline, no masses. Cardiovascular: No clubbing,  cyanosis, or edema. Respiratory: Normal respiratory effort, no increased work  of breathing. GI: Abdomen is soft, nontender, nondistended, no abdominal masses GU: No CVA tenderness.  Urethral mucosal prolapse.  Well supported bladder neck.  Grade 1 cystocele.  Moderate atrophy.  Negative stress incontinence Skin: No rashes, bruises or suspicious lesions. Lymph: No cervical or inguinal adenopathy. Neurologic: Grossly intact, no focal deficits, moving all 4 extremities. Psychiatric: Normal mood and affect.  Laboratory Data: Lab Results  Component Value Date   WBC 4.0 03/04/2020   HGB 12.4 03/04/2020   HCT 37.2 03/04/2020   MCV 84.3 03/04/2020   PLT 168.0 03/04/2020    Lab Results  Component Value Date   CREATININE 0.68 05/13/2020    No results found for: PSA  No results found for: TESTOSTERONE  Lab Results  Component Value Date   HGBA1C 5.6 03/04/2020    Urinalysis    Component Value Date/Time   COLORURINE STRAW (A) 06/16/2019 1245   APPEARANCEUR CLEAR 06/16/2019 1245   APPEARANCEUR Clear 02/28/2013 1204   LABSPEC 1.039 (H) 06/16/2019 1245   LABSPEC 1.006 02/28/2013 1204   PHURINE 5.0 06/16/2019 1245   GLUCOSEU NEGATIVE 06/16/2019 1245   GLUCOSEU Negative 02/28/2013 1204   HGBUR MODERATE (A) 06/16/2019 1245   BILIRUBINUR neg 05/23/2020 1047   BILIRUBINUR Negative 02/28/2013 1204   KETONESUR NEGATIVE 06/16/2019 1245   PROTEINUR Negative 05/23/2020 1047   PROTEINUR NEGATIVE 06/16/2019 1245   UROBILINOGEN 0.2 05/23/2020 1047   UROBILINOGEN 0.2 02/08/2012 0324   NITRITE neg 05/23/2020 1047   NITRITE NEGATIVE 06/16/2019 1245   LEUKOCYTESUR Large (3+) (A) 05/23/2020 1047   LEUKOCYTESUR NEGATIVE 06/16/2019 1245   LEUKOCYTESUR 3+ 02/28/2013 1204    Pertinent Imaging: Reviewed.  Chart reviewed.  Urine sent for culture   Assessment & Plan: Minimal voiding dysfunction.  Call if urine culture positive.  Reassurance given.  Watchful waiting versus medication  discussed  1. Urinary incontinence, unspecified type  - Urinalysis, Complete - Bladder Scan (Post Void Residual) in office   No follow-ups on file.  Reece Packer, MD  [place] [address], [place], [place] [address] [phone]

## 2020-05-29 LAB — URINALYSIS, COMPLETE
Bilirubin, UA: NEGATIVE
Glucose, UA: NEGATIVE
Ketones, UA: NEGATIVE
Nitrite, UA: NEGATIVE
Protein,UA: NEGATIVE
Specific Gravity, UA: <1.005 — ABNORMAL LOW (ref 1.005–1.030)
Urobilinogen, Ur: 0.2 mg/dL (ref 0.2–1.0)
pH, UA: 5.5 (ref 5.0–7.5)

## 2020-05-29 LAB — CULTURE, URINE COMPREHENSIVE

## 2020-05-29 LAB — MICROSCOPIC EXAMINATION

## 2020-05-30 ENCOUNTER — Telehealth: Payer: Self-pay | Admitting: Internal Medicine

## 2020-05-30 NOTE — Telephone Encounter (Signed)
Pt c/o medication issue:  1. Name of Medication: Biotin  2. How are you currently taking this medication (dosage and times per day)? Has not started  3. Are you having a reaction (difficulty breathing--STAT)? no  4. What is your medication issue? Patient would like to know if she can take biotin supplements for hair loss.

## 2020-05-30 NOTE — Telephone Encounter (Signed)
Spoke with pt and advised per pharmacy team, OK to take Biotin.  Pt asking if she can have Lipds checked on her next visit (10/2020) to see Dr Caryl Comes as she has had purposeful major weight loss and would like to be able to ultimately stop her Zetia if possible.  Pt advised will make note to address next visit.  Pt verbalizes understanding and thanked Therapist, sports for the call.

## 2020-05-30 NOTE — Telephone Encounter (Signed)
Yes, ok to take

## 2020-06-06 ENCOUNTER — Other Ambulatory Visit: Payer: Self-pay

## 2020-06-06 ENCOUNTER — Ambulatory Visit (INDEPENDENT_AMBULATORY_CARE_PROVIDER_SITE_OTHER): Payer: Medicare Other

## 2020-06-06 DIAGNOSIS — Z7901 Long term (current) use of anticoagulants: Secondary | ICD-10-CM | POA: Diagnosis not present

## 2020-06-06 LAB — POCT INR: INR: 2.6 (ref 2.0–3.0)

## 2020-06-06 NOTE — Patient Instructions (Addendum)
Pre visit review using our clinic review tool, if applicable. No additional management support is needed unless otherwise documented below in the visit note.  Continue 5mg daily except 2.5mg on Mondays and Fridays. Recheck INR in 4 week with [facility]. 

## 2020-06-09 NOTE — Progress Notes (Signed)
Agree. Thanks

## 2020-06-13 ENCOUNTER — Telehealth: Payer: Self-pay | Admitting: Family Medicine

## 2020-06-13 DIAGNOSIS — H9190 Unspecified hearing loss, unspecified ear: Secondary | ICD-10-CM | POA: Insufficient documentation

## 2020-06-13 DIAGNOSIS — H9193 Unspecified hearing loss, bilateral: Secondary | ICD-10-CM

## 2020-06-13 NOTE — Addendum Note (Signed)
Addended by: [name] A on: 06/13/2020 03:55 PM   Modules accepted: Orders

## 2020-06-13 NOTE — Telephone Encounter (Signed)
Patient is requesting a referral to the audiologist. Patient has already schedule an appt with them for 06/29/20. Explained to the patient that she may need an appt with the provider and she was not thrilled about that so I told her I would ask if a referral could be placed without a visit. Please advise. NO promises made. EM

## 2020-06-13 NOTE — Telephone Encounter (Signed)
Referral needs to go to: [facility] Fax # [phone]

## 2020-06-13 NOTE — Telephone Encounter (Signed)
Referral done

## 2020-06-26 NOTE — Telephone Encounter (Signed)
[place] Night - Client Nonclinical Telephone Record AccessNurse Client [place] Night - Client Client Site [facility], Roque Lias - MD Contact Type Call Who Is Calling Patient / Member / Family / Caregiver Caller Name Billy Fischer with [facility] Phone Number [phone] Patient Name Bibi Economos Patient DOB [birth year] Call Type Message Only Information Provided Reason for Call Request for General Office Information Initial Comment Caller states she needs a signature on a referral. Disp. Time Disposition Final User 06/25/2020 5:01:40 PM General Information Provided Yes Paulita Cradle Call Closed By: Paulita Cradle Transaction Date/Time: 06/25/2020 4:59:25 PM (ET)

## 2020-06-26 NOTE — Telephone Encounter (Signed)
This fax was placed on Dr Marliss Coots desk this morning to sign and then will be faxed over.

## 2020-06-26 NOTE — Telephone Encounter (Signed)
Will route to the PCCs so they can f/u with pt regarding what she needs for this referral

## 2020-07-02 ENCOUNTER — Encounter: Payer: Self-pay | Admitting: Internal Medicine

## 2020-07-04 ENCOUNTER — Ambulatory Visit (INDEPENDENT_AMBULATORY_CARE_PROVIDER_SITE_OTHER): Payer: Medicare Other

## 2020-07-04 ENCOUNTER — Other Ambulatory Visit: Payer: Self-pay

## 2020-07-04 DIAGNOSIS — Z7901 Long term (current) use of anticoagulants: Secondary | ICD-10-CM | POA: Diagnosis not present

## 2020-07-04 LAB — POCT INR: INR: 2.3 (ref 2.0–3.0)

## 2020-07-04 NOTE — Patient Instructions (Addendum)
Pre visit review using our clinic review tool, if applicable. No additional management support is needed unless otherwise documented below in the visit note.  Continue 5mg  daily except 2.5mg  on Mondays and Fridays. Recheck INR in 4 week with [facility].

## 2020-07-25 LAB — CUP PACEART REMOTE DEVICE CHECK
Battery Remaining Longevity: 130 mo
Battery Voltage: 3.07 V
Brady Statistic AP VP Percent: 0 %
Brady Statistic AP VS Percent: 0 %
Brady Statistic AS VP Percent: 97.35 %
Brady Statistic AS VS Percent: 2.65 %
Brady Statistic RA Percent Paced: 0 %
Brady Statistic RV Percent Paced: 97.35 %
Date Time Interrogation Session: 20220503215943
Implantable Lead Implant Date: 20210507
Implantable Lead Location: 753860
Implantable Lead Model: 5076
Implantable Pulse Generator Implant Date: 20210507
Lead Channel Impedance Value: 3306 Ohm
Lead Channel Impedance Value: 3306 Ohm
Lead Channel Impedance Value: 437 Ohm
Lead Channel Impedance Value: 513 Ohm
Lead Channel Pacing Threshold Amplitude: 0.5 V
Lead Channel Pacing Threshold Pulse Width: 0.4 ms
Lead Channel Sensing Intrinsic Amplitude: 1.875 mV
Lead Channel Sensing Intrinsic Amplitude: 8.25 mV
Lead Channel Sensing Intrinsic Amplitude: 8.25 mV
Lead Channel Setting Pacing Amplitude: 2.5 V
Lead Channel Setting Pacing Pulse Width: 0.4 ms
Lead Channel Setting Sensing Sensitivity: 4 mV

## 2020-07-26 ENCOUNTER — Ambulatory Visit (INDEPENDENT_AMBULATORY_CARE_PROVIDER_SITE_OTHER): Payer: Medicare Other

## 2020-07-26 DIAGNOSIS — I4821 Permanent atrial fibrillation: Secondary | ICD-10-CM

## 2020-07-29 ENCOUNTER — Other Ambulatory Visit: Payer: Self-pay | Admitting: Family Medicine

## 2020-07-29 DIAGNOSIS — Z7901 Long term (current) use of anticoagulants: Secondary | ICD-10-CM

## 2020-07-30 NOTE — Telephone Encounter (Signed)
Pharmacy requests refill on: Alendronate 70 mg   LAST REFILL: 03/11/2020 (Q-1, R-0) LAST OV: 04/23/2020 NEXT OV: Not Scheduled  PHARMACY: [facility]   Pharmacy requests refill on: Warfarin 5 mg   LAST REFILL: 05/09/2020 (Q-90, R-0) LAST OV: 07/04/2020 NEXT OV: 08/01/2020 PHARMACY: [facility]   Pharmacy requests refill on: Ferrous Sulfate 325 mg EC   LAST REFILL: 11/28/2019 (Q-90, R-3) LAST OV: 04/23/2020 NEXT OV: Not Scheduled  PHARMACY: [facility] Earliest Fill Date: 11/27/2020

## 2020-08-01 ENCOUNTER — Other Ambulatory Visit: Payer: Self-pay

## 2020-08-01 ENCOUNTER — Ambulatory Visit (INDEPENDENT_AMBULATORY_CARE_PROVIDER_SITE_OTHER): Payer: Medicare Other

## 2020-08-01 DIAGNOSIS — Z7901 Long term (current) use of anticoagulants: Secondary | ICD-10-CM | POA: Diagnosis not present

## 2020-08-01 LAB — POCT INR: INR: 1.8 — AB (ref 2.0–3.0)

## 2020-08-01 NOTE — Patient Instructions (Addendum)
Pre visit review using our clinic review tool, if applicable. No additional management support is needed unless otherwise documented below in the visit note.  Increase dose today to take 10 mg (2 tablets) and then continue 5mg  daily except 2.5mg  on Mondays and Fridays. Recheck INR in 4 week with [facility].

## 2020-08-04 NOTE — Progress Notes (Signed)
Agree. Thanks

## 2020-08-12 NOTE — Progress Notes (Signed)
Remote pacemaker transmission.   

## 2020-08-14 ENCOUNTER — Telehealth: Payer: Self-pay

## 2020-08-14 NOTE — Telephone Encounter (Signed)
Pt left v/m that she tested positive for covid this morning and the instructions she was given was to call Dr Glori Bickers. I spoke with pt; pt said her symptoms started last night on 08/13/20. last wk pt was very tired and low grade fever;body aches, chills, pt has not had H/a, cough, SOB no change in sense of taste or smell.and no diarrhea or vomiting. Scratchy throat and runny nose. No head or sinus congestion. Pt was in convention at [place] center in [place] and pt came home every day and the convention lasted 4 days from 08/18/20 - 08/11/20. Pt did not wear a mask or social distance and there thousand or more people there. Pt did not hug anyone. No distress and pt feels mainly tired. Pt said she is fatigued at this time and is going to lay down to rest for a bit. UC & ED precautions given and pt voiced understanding. Pt scheduled appt with Dr Einar Pheasant 08/15/20 at 12 noon in case needs oral med for covid (paxlovid). Sending note to Dr Glori Bickers as PCP and Dr Einar Pheasant

## 2020-08-15 ENCOUNTER — Telehealth (INDEPENDENT_AMBULATORY_CARE_PROVIDER_SITE_OTHER): Payer: Medicare Other | Admitting: Family Medicine

## 2020-08-15 ENCOUNTER — Other Ambulatory Visit: Payer: Self-pay

## 2020-08-15 VITALS — Wt 145.0 lb

## 2020-08-15 DIAGNOSIS — U071 COVID-19: Secondary | ICD-10-CM | POA: Diagnosis not present

## 2020-08-15 NOTE — Telephone Encounter (Signed)
Pt declined outpatient treatment for Covid. She will monitor symptoms. See note.

## 2020-08-15 NOTE — Progress Notes (Signed)
    I connected with [name] on 08/15/20 at 12:00 PM EDT by video and verified that I am speaking with the correct person using two identifiers.   I discussed the limitations, risks, security and privacy concerns of performing an evaluation and management service by video and the availability of in person appointments. I also discussed with the patient that there may be a patient responsible charge related to this service. The patient expressed understanding and agreed to proceed.  Patient location: Home Provider Location: [facility] Participants: Lesleigh Noe and [name]   Subjective:     [name] is a 87 y.o. female presenting for Covid Positive (Onset of sx 08/13/20), Fatigue, and Sore Throat (Just a little scratchy)     HPI   #Covid - 08/13/2020 - fatigue and scratchythroat - has not received a covid vaccine  Is not interested in any of the new treatments   Review of Systems  Constitutional: Negative for chills and fever.  HENT: Positive for postnasal drip. Negative for congestion.   Respiratory: Negative for cough and shortness of breath.   Cardiovascular: Negative for chest pain.  Gastrointestinal: Negative for diarrhea, nausea and vomiting.  Musculoskeletal: Positive for myalgias.  Neurological: Negative for headaches.     Social History   Tobacco Use  Smoking Status Never Smoker  Smokeless Tobacco Never Used        Objective:   BP Readings from Last 3 Encounters:  05/27/20 (!) 153/79  05/23/20 130/80  05/13/20 138/72   Wt Readings from Last 3 Encounters:  08/15/20 145 lb (65.8 kg)  05/27/20 154 lb (69.9 kg)  05/23/20 155 lb 8 oz (70.5 kg)   Wt 145 lb (65.8 kg)   BMI 24.89 kg/m    Physical Exam Constitutional:      Appearance: Normal appearance. She is not ill-appearing.  HENT:     Head: Normocephalic and atraumatic.     Right Ear: External ear normal.     Left Ear: External ear normal.  Eyes:      Conjunctiva/sclera: Conjunctivae normal.  Pulmonary:     Effort: Pulmonary effort is normal. No respiratory distress.  Neurological:     Mental Status: She is alert. Mental status is at baseline.  Psychiatric:        Mood and Affect: Mood normal.        Behavior: Behavior normal.        Thought Content: Thought content normal.        Judgment: Judgment normal.             Assessment & Plan:   Problem List Items Addressed This Visit   None   Visit Diagnoses    COVID-19 virus infection    -  Primary     Discussed that pt is high risk for severe covid but at this time she declined any outpatient treatment.   ER precautions and return precautions discussed  Continue symptomatic care   Return if symptoms worsen or fail to improve.  Lesleigh Noe, MD

## 2020-08-15 NOTE — Patient Instructions (Signed)
Isolation  - Can go into public if needed wearing a mask started on May 30 - June 4th you can resume normal activity without a mask    1. Drink plenty of fluids 2. Get lots of rest  Sinus Congestion 1) Neti Pot (Saline rinse) -- 2 times day -- if tolerated 2) Flonase (Store Brand ok) - once daily 3) Over the counter congestion medications  Cough 1) Cough drops can be helpful 2) Nyquil (or nighttime cough medication) 3) Honey is proven to be one of the best cough medications  4) Cough medicine with Dextromethorphan can also be helpful  Sore Throat 1) Honey as above, cough drops 2) Ibuprofen or Aleve can be helpful 3) Salt water Gargles

## 2020-08-16 ENCOUNTER — Telehealth: Payer: Self-pay | Admitting: *Deleted

## 2020-08-16 NOTE — Telephone Encounter (Signed)
Would recommend the following for throat:   1) Ibuprofen OK to take though with caution as on warfarin  2) Salt Water Gargles  3) Honey - by the spoonful or in tea  4) Over the Counter Chloraseptic throat spray

## 2020-08-16 NOTE — Telephone Encounter (Signed)
Pt left VM at Triage. She is covid positive and recently had a virtual visit with Dr. Einar Pheasant on 08/15/20 to discuss sxs. Pt said her throat is very sore and painful. She is asking if a provider will call her in a "throat spray" to help with the pain. She also said she has ibuprofen at home but not sure if that's safe to take.   [facility]  Will rout to Dr. Einar Pheasant who saw pt virtually and also PCP since Dr. Einar Pheasant is out of the office today

## 2020-08-16 NOTE — Telephone Encounter (Signed)
Pt notified of Dr. Verda Cumins recommendations. She will try all 4 of them and keep [name] posted. She will use ibuprofen but just PRN and will be very careful not to take to much of it

## 2020-08-29 ENCOUNTER — Ambulatory Visit (INDEPENDENT_AMBULATORY_CARE_PROVIDER_SITE_OTHER): Payer: Medicare Other

## 2020-08-29 ENCOUNTER — Other Ambulatory Visit: Payer: Self-pay

## 2020-08-29 DIAGNOSIS — Z7901 Long term (current) use of anticoagulants: Secondary | ICD-10-CM | POA: Diagnosis not present

## 2020-08-29 LAB — POCT INR: INR: 2.7 (ref 2.0–3.0)

## 2020-08-29 NOTE — Patient Instructions (Addendum)
Pre visit review using our clinic review tool, if applicable. No additional management support is needed unless otherwise documented below in the visit note.  Continue 5mg  daily except 2.5mg  on Mondays and Fridays. Recheck INR in 4 week.

## 2020-09-13 ENCOUNTER — Other Ambulatory Visit: Payer: Self-pay

## 2020-09-13 ENCOUNTER — Ambulatory Visit
Admission: EM | Admit: 2020-09-13 | Discharge: 2020-09-13 | Disposition: A | Discharge status: Home / Self Care | Payer: Medicare Other

## 2020-09-14 DIAGNOSIS — W57XXXA Bitten or stung by nonvenomous insect and other nonvenomous arthropods, initial encounter: Secondary | ICD-10-CM | POA: Diagnosis not present

## 2020-09-14 DIAGNOSIS — B002 Herpesviral gingivostomatitis and pharyngotonsillitis: Secondary | ICD-10-CM | POA: Diagnosis not present

## 2020-09-16 ENCOUNTER — Telehealth: Payer: Self-pay | Admitting: Family Medicine

## 2020-09-16 NOTE — Telephone Encounter (Signed)
It has been over 10 y from the last  A tetanus shot would not be covered here  I recommend she check in with her pharmacy/ins to see if covered and it affordable, get it at the pharmacy   Keep bug bites cool and clean with soap and water Cortisone cream otc prn F/u if worse or no imp

## 2020-09-16 NOTE — Telephone Encounter (Signed)
Pt will go to pharmacy to get vaccine. Pt did [facility] she said she had a reaction to something and they gave her a med that's been helping some. Pt wasn't sure the name of med but looking in external med reconcile it is valtrex 1 g with instructions on taking med BID x 5 days. Pt said she wanted to f/u with PCP to have legs rechecked and discuss an allergist referral because she isn't sure if she was bitten or is having a allergic reaction to something. Appt scheduled this Friday 09/20/20 to discuss with PCP   FYI to PCP

## 2020-09-16 NOTE — Telephone Encounter (Signed)
[name] wanted to know about getting a tetanus shot due to bug bites and is afraid of blood disease.

## 2020-09-16 NOTE — Telephone Encounter (Signed)
Aware, thanks  Will see her then

## 2020-09-18 ENCOUNTER — Other Ambulatory Visit: Payer: Self-pay | Admitting: Family Medicine

## 2020-09-20 ENCOUNTER — Ambulatory Visit (INDEPENDENT_AMBULATORY_CARE_PROVIDER_SITE_OTHER): Payer: Medicare Other | Admitting: Family Medicine

## 2020-09-20 ENCOUNTER — Other Ambulatory Visit: Payer: Self-pay

## 2020-09-20 ENCOUNTER — Encounter: Payer: Self-pay | Admitting: Family Medicine

## 2020-09-20 VITALS — BP 118/62 | HR 82 | Temp 97.3°F | Ht 64.0 in | Wt 149.0 lb

## 2020-09-20 DIAGNOSIS — W57XXXA Bitten or stung by nonvenomous insect and other nonvenomous arthropods, initial encounter: Secondary | ICD-10-CM | POA: Insufficient documentation

## 2020-09-20 DIAGNOSIS — S80869A Insect bite (nonvenomous), unspecified lower leg, initial encounter: Secondary | ICD-10-CM | POA: Diagnosis not present

## 2020-09-20 DIAGNOSIS — F4320 Adjustment disorder, unspecified: Secondary | ICD-10-CM | POA: Insufficient documentation

## 2020-09-20 DIAGNOSIS — L299 Pruritus, unspecified: Secondary | ICD-10-CM | POA: Diagnosis not present

## 2020-09-20 DIAGNOSIS — F4321 Adjustment disorder with depressed mood: Secondary | ICD-10-CM

## 2020-09-20 MED ORDER — PREDNISONE 10 MG PO TABS: ORAL_TABLET | ORAL | 0 refills | Status: DC

## 2020-09-20 NOTE — Assessment & Plan Note (Signed)
Started with multiple insect bites on legs bilat Now more diffuse itching Rev notes from UC  tx with zyrtec  Prednisone (rev side eff) If worse or no imp consider allergy ref

## 2020-09-20 NOTE — Assessment & Plan Note (Signed)
Sudden loss of son (cancer pt) at her home Overall doing ok /questions her ability to handle everything but very good support Offered help if needed/ counseling

## 2020-09-20 NOTE — Assessment & Plan Note (Signed)
This started with insect bites on both legs and she was eval at [facility]  Rev note from 6/25 there and phone call to [name] on 6/27  Today pt c/o itching in areas of bites and other parts of body also (no rash or tick bite)  tx plan: Zyrtec 10 mg daily  Add benadryl at night only if needed pred 30 mg taper (discussed side eff to expect with this)  Seems to be multifactorial Also in setting of severe stress/unexpected death of son Update if not starting to improve in a week or if worsening   Consider allergist ref if no imp or if symptoms return

## 2020-09-20 NOTE — Patient Instructions (Addendum)
Avoid getting hot   Use your triamcinolone ointment on itchy spots   Get zyrtec 10 mg over the counter  You can still use benadryl at night with caution of sedation   We can try a prednisone 30 mg taper It may make you hyper and hungry  Finish the taper   If no improvement or if symptoms return please let me know  We would consider an allergist appointment   Lean on your support Take care of yourself  Let us know if you want some grief counselor

## 2020-09-20 NOTE — Progress Notes (Signed)
Subjective:    Patient ID: [name], female    DOB: Sep 13, 1932, 87 y.o.   MRN: [id number]  This visit occurred during the SARS-CoV-2 public health emergency.  Safety protocols were in place, including screening questions prior to the visit, additional usage of staff PPE, and extensive cleaning of exam room while observing appropriate contact time as indicated for disinfecting solutions.   HPI Pt presents for c/o for re check of legs after bug bites  and to discuss allergist referral  Wt Readings from Last 3 Encounters:  09/20/20 149 lb (67.6 kg)  08/15/20 145 lb (65.8 kg)  05/27/20 154 lb (69.9 kg)   25.58 kg/m  Lost her son 2 wk ago  Died suddenly and she found him (had been cancer pt)  Her body is reacting to stress  She was seen in UC on 6/25 Per note- had scattered erythematous macular lesions about the size of a dime present on bilat lower legs  Along with this fever blister corner of L mouth   She was px valtrex for the fever blister  Triamcinolone oint 0.5% to leg lesions bid   The spots feel hard now  Severe itching  Using the ointment   Some places behind legs  On back  Both sides  Some on chest  Lidocaine topical also   Has allergies and mild asthma  Super allergic to bugs No tick bites recently   Had her yard sprayed after this all happens    Per phone call on 6/27: . Pt did say [facility] she said she had a reaction to something and they gave her a med that's been helping some. Pt wasn't sure the name of med but looking in external med reconcile it is valtrex 1 g with instructions on taking med BID x 5 days. Pt said she wanted to f/u with PCP to have legs rechecked and discuss an allergist referral because she isn't sure if she was bitten or is having a allergic reaction to  Patient Active Problem List   Diagnosis Date Noted   Insect bite 09/20/2020   Grief reaction 09/20/2020   Hearing loss 06/13/2020   Atrial  fibrillation ([place]) 04/24/2020   Urethral caruncle 04/23/2020   Pelvic pain 03/11/2020   Routine general medical examination at a health care facility 03/11/2020   Elevated glucose 03/03/2020   Rectal bleeding 02/18/2020   History of TIA (transient ischemic attack) 02/18/2020   S/P AV nodal ablation 07/28/19 07/29/2019   S/P placement of cardiac pacemaker MDT 07/28/19 07/29/2019   AV block 07/28/2019   CVA (cerebral vascular accident) ([place]) 06/16/2019   Facial tingling 11/29/2018   Tremor of left hand 11/29/2018   Medicare annual wellness visit, subsequent 11/24/2018   Dysuria 02/06/2018   Iron deficiency anemia 10/21/2017   Rapid atrial fibrillation ([place]) 08/19/2017   Constipation 08/02/2017   Numbness and tingling 07/14/2017   Paresthesia 07/14/2017   Unilateral primary osteoarthritis, left hip 05/04/2017   Status post total replacement of left hip 05/04/2017   Hip osteoarthritis 04/27/2017   Long term (current) use of anticoagulants 03/04/2017   Venous stasis dermatitis of both lower extremities 01/08/2017   Impacted cerumen of right ear 11/20/2016   Osteopenia 10/25/2016   Pedal edema 08/26/2016   Varicose veins of both lower extremities 08/26/2016   Estrogen deficiency 08/26/2016   Screening mammogram, encounter for 08/26/2016   Hemorrhoids 08/26/2016   History of nonmelanoma skin cancer  01/01/2016   Pruritus 10/04/2015   Hip pain 08/02/2014   Left knee pain 08/02/2014   Chronic cough 05/08/2014   Hematochezia 04/09/2014   Colon cancer screening 08/16/2013   Encounter for therapeutic drug monitoring 04/20/2013   Left ovarian cyst 03/14/2013   Palpitations 04/15/2012   COLONIC POLYPS, ADENOMATOUS, HX OF 09/18/2009   PULMONARY NODULE 12/20/2008   GANGLION CYST 10/04/2007   Mixed incontinence 04/28/2007   Hyperlipidemia 04/27/2007   Depression with anxiety 04/27/2007   Asthma, mild intermittent 04/27/2007   INSOMNIA 04/27/2007   ADENOMATOUS COLONIC POLYP 11/04/2006    Hypothyroidism 09/02/2006   Atrial fibrillation, chronic ([place]) 08/05/2006   Past Medical History:  Diagnosis Date   Allergic rhinitis    Alopecia 2/2 beta blockers    Anemia    Arthritis    Atrial fibrillation -persistent cardiologist-  dr klein/  primary EP -- dr Tawanna Sat ([facility])   a. s/p PVI [name] 2010;  b. on tikosyn/coumadin;  c. 05/2009 Echo: EF 60-65%, Gr 2 DD. (first dx 09/ 2007)   Bilateral lower extremity edema    Bleeding hemorrhoid    Carotid stenosis    mild (hosp 3/11)- consult by vasc/ Dr Donnetta Hutching   Complication of anesthesia    hard to wake   Diverticulosis of colon    Dyspnea    on exertion-climbing stairs   Fatty liver    H/O cardiac radiofrequency ablation    01/ 2008 at [facility] /  03/ 2010  at [facility] failure with preserved ejection fraction [facility])    History of adenomatous polyp of colon    tubular adenoma's   History of cardiomyopathy    secondary tachycardia-induced cardiomyopathy -- resolved 2014   History of squamous cell carcinoma in situ (SCCIS) of skin    05/ 2017  nasal bridge and right medial knee   History of transient ischemic attack (TIA)    01-24-2005 and 06-12-2009   Hyperlipidemia    Hypothyroidism    Mild intermittent asthma    reacts to cats   Mixed stress and urge urinary incontinence    Presence of permanent cardiac pacemaker    was put in 07/2019   Pulmonary nodule    S/P AV nodal ablation 07/28/19 07/29/2019   S/P mitral valve repair 10-23-1998  dr Boyce Medici at [facility]   for MVP and regurg. (annuloplasty ring procedure)   S/P placement of cardiac pacemaker MDT 07/28/19 07/29/2019   Past Surgical History:  Procedure Laterality Date   APPENDECTOMY  1978   AV NODE ABLATION N/A 07/28/2019   Procedure: AV NODE ABLATION;  Surgeon: Deboraha Sprang, MD;  Location: [place] [facility] LAB;  Service: Cardiovascular;  Laterality: N/A;   BUBBLE STUDY  06/19/2019   Procedure: BUBBLE STUDY;  Surgeon: Pixie Casino, MD;   Location: [facility] ENDOSCOPY;  Service: Cardiovascular;;   CARDIAC ELECTROPHYSIOLOGY [place]  01/ 2008    at [place]   right-sided ablation atrial flutter   [place]  03/ 2010   dr Jaymes Graff at [facility]   AV node ablation and pulmonary vein isolation for atrial fib   CARDIOVERSION  06-18-2006;  07-13-2006;  10-19-2010;  10-27-2010   COLONOSCOPY     COLONOSCOPY WITH PROPOFOL N/A 10/13/2017   Procedure: COLONOSCOPY WITH PROPOFOL;  Surgeon: [name], MD;  Location: [facility] ENDOSCOPY;  Service: Gastroenterology;  Laterality: N/A;   COLONOSCOPY WITH PROPOFOL N/A 02/20/2020   Procedure: COLONOSCOPY WITH PROPOFOL;  Surgeon: Lesly Rubenstein, MD;  Location: [facility] ENDOSCOPY;  Service: Endoscopy;  Laterality: N/A;   CYSTO/ TRANSURETHRAL COLLAGEN INJECTION THERAPY  07-26-2007   dr Matilde Sprang   DILATION AND CURETTAGE OF UTERUS     ESOPHAGOGASTRODUODENOSCOPY (EGD) WITH PROPOFOL N/A 10/13/2017   Procedure: ESOPHAGOGASTRODUODENOSCOPY (EGD) WITH PROPOFOL;  Surgeon: [name], MD;  Location: [facility] ENDOSCOPY;  Service: Gastroenterology;  Laterality: N/A;   EVALUATION UNDER ANESTHESIA WITH HEMORRHOIDECTOMY N/A 04/09/2020   Procedure: EXAM UNDER ANESTHESIA WITH HEMORRHOIDECTOMY;  Surgeon: Jules Husbands, MD;  Location: [facility] ORS;  Service: General;  Laterality: N/A;   EXCISIONAL HEMORRHOIDECTOMY  1980s   GIVENS CAPSULE STUDY N/A 12/08/2017   Procedure: GIVENS CAPSULE STUDY;  Surgeon: [name], MD;  Location: [facility] ENDOSCOPY;  Service: Gastroenterology;  Laterality: N/A;   HEMORRHOID SURGERY N/A 10/29/2016   Procedure: HEMORRHOIDECTOMY;  Surgeon: Leighton Ruff, MD;  Location: [facility];  Service: General;  Laterality: N/A;   MITRAL VALVE ANNULOPLASTY  10/23/1998   "Model 4625; Campbell Lerner [id number]"; size 92mm; [facility]; Dr. Boyce Medici   PACEMAKER IMPLANT N/A 07/28/2019   Procedure: PACEMAKER IMPLANT;  Surgeon: Deboraha Sprang, MD;  Location: [place] [facility] LAB;  Service: Cardiovascular;  Laterality: N/A;   [place]   TEE WITH CARDIOVERSION  05-06-2006 at [facility];  01-02-2013 at [facility]   TEE WITHOUT CARDIOVERSION N/A 06/19/2019   Procedure: TRANSESOPHAGEAL ECHOCARDIOGRAM (TEE);  Surgeon: Pixie Casino, MD;  Location: [facility] ENDOSCOPY;  Service: Cardiovascular;  Laterality: N/A;   TOTAL HIP ARTHROPLASTY Left 05/04/2017   Procedure: LEFT TOTAL HIP ARTHROPLASTY ANTERIOR APPROACH;  Surgeon: Mcarthur Rossetti, MD;  Location: [place];  Service: Orthopedics;  Laterality: Left;   TRANSTHORACIC ECHOCARDIOGRAM  05-01-2015   dr Caryl Comes   ef 50-55%/  mild AV sclerosis without stenosis/  post MV repair with mild central MR (valve area by pressure half-time 2cm^2,  valve area by continutity equation 0.91cm^2, peak grandiant 10mmHg)/  severe LAE/ mild TR/ mild RAE    TUBAL LIGATION Bilateral 1978   Social History   Tobacco Use   Smoking status: Never   Smokeless tobacco: Never  Vaping Use   Vaping Use: Never used  Substance Use Topics   Alcohol use: Yes    Alcohol/week: 0.0 standard drinks    Comment: seldom   Drug use: No   Family History  Problem Relation Age of Onset   Lung cancer Father        smoker, died at 14   Alcohol abuse Father    Cancer Father        bladder and lung CA smoker   Sudden death Other    Breast cancer Neg Hx    Stroke Neg Hx    Allergies  Allergen Reactions   Amiodarone Swelling    SWELLING REACTION UNSPECIFIED    Penicillins Hives and Rash    Has patient had a PCN reaction causing immediate rash, facial/tongue/throat swelling, SOB or lightheadedness with hypotension: No Has patient had a PCN reaction causing severe rash involving mucus membranes or skin necrosis: No Has patient had a PCN reaction that required hospitalization:Patient was inpatient when reaction occurred Has patient had a PCN reaction occurring within the last 10 years: No If all of the above answers are "NO", then may  proceed with Cephalosporin use   Amiodarone Hcl Swelling    SWELLING REACTION UNSPECIFIED    Statins Rash    REACTION: rash   Current Outpatient Medications on File Prior to  Visit  Medication Sig Dispense Refill   acetaminophen (TYLENOL) 500 MG tablet Take 500 mg by mouth every 6 (six) hours as needed for moderate pain.     alendronate (FOSAMAX) 70 MG tablet TAKE 1 TABLET EVERY 7 DAYS. TAKE WITH A FULL GLASS OF WATER ON AN EMPTY STOMACH. 12 tablet 3   Ascorbic Acid (VITAMIN C) 1000 MG tablet Take 1,000 mg by mouth daily.     diclofenac Sodium (VOLTAREN) 1 % GEL Apply 1 application topically 4 (four) times daily as needed (pain).     diphenhydrAMINE (BENADRYL) 25 mg capsule Take 25 mg by mouth every 6 (six) hours as needed for itching.     diphenhydrAMINE-zinc acetate (BENADRYL) cream Apply 1 application topically 3 (three) times daily as needed for itching.     ezetimibe (ZETIA) 10 MG tablet Take 1 tablet (10 mg total) by mouth daily. 90 tablet 3   ferrous sulfate 325 (65 FE) MG EC tablet TAKE 1 TABLET EVERY DAY WITH BREAKFAST 90 tablet 1   fluticasone (FLONASE) 50 MCG/ACT nasal spray Place 1 spray into both nostrils daily as needed for allergies.     furosemide (LASIX) 40 MG tablet Take 40 mg by mouth daily as needed for edema.     Ketotifen Fumarate (EYE ITCH RELIEF OP) Place 1 drop into both eyes daily as needed (allergies).     levothyroxine (SYNTHROID) 50 MCG tablet Take 1 tablet (50 mcg total) by mouth daily. 90 tablet 3   Polyethyl Glycol-Propyl Glycol 0.4-0.3 % SOLN Place 1-2 drops into both eyes 3 (three) times daily as needed (for dry eyes.).     warfarin (COUMADIN) 5 MG tablet TAKE 1 TABLET DAILY EXCEPT 1/2 TABLET ON MONDAYS AND FRIDAYS OR AS DIRECTED BY COUMADIN CLINIC 90 tablet 0   No current facility-administered medications on file prior to visit.    Review of Systems  Constitutional:  Negative for activity change, appetite change, fatigue, fever and unexpected weight change.   HENT:  Negative for congestion, ear pain, rhinorrhea, sinus pressure and sore throat.   Eyes:  Negative for pain, redness and visual disturbance.  Respiratory:  Negative for cough, shortness of breath and wheezing.   Cardiovascular:  Negative for chest pain and palpitations.  Gastrointestinal:  Negative for abdominal pain, blood in stool, constipation and diarrhea.  Endocrine: Negative for polydipsia and polyuria.  Genitourinary:  Negative for dysuria, frequency and urgency.  Musculoskeletal:  Negative for arthralgias, back pain and myalgias.  Skin:  Negative for pallor and rash.       Itching   Allergic/Immunologic: Negative for environmental allergies.  Neurological:  Negative for dizziness, syncope and headaches.  Hematological:  Negative for adenopathy. Does not bruise/bleed easily.  Psychiatric/Behavioral:  Negative for decreased concentration and dysphoric mood. The patient is nervous/anxious.        Grief       Objective:   Physical Exam Constitutional:      General: She is not in acute distress.    Appearance: Normal appearance. She is normal weight. She is not ill-appearing.  Eyes:     Conjunctiva/sclera: Conjunctivae normal.     Pupils: Pupils are equal, round, and reactive to light.  Skin:    General: Skin is warm and dry.     Coloration: Skin is not pale.     Findings: No erythema or rash.     Comments: Healing papule/insect bite behind R knee   Sks diffusely  Healing scab from old injury on R shin  Many bruises of diff sizes/think skin (anticoagulant)   No tick bite No hives No rash  No inv of joint spaces or groin or axilla  Neurological:     Mental Status: She is alert.     Cranial Nerves: No cranial nerve deficit.     Sensory: No sensory deficit.  Psychiatric:        Mood and Affect: Mood normal.        Cognition and Memory: Cognition and memory normal.     Comments: Not tearful  Candidly discusses grief           Assessment & Plan:    Problem List Items Addressed This Visit       Musculoskeletal and Integument   Pruritus    This started with insect bites on both legs and she was eval at [facility]  Rev note from 6/25 there and phone call to [name] on 6/27  Today pt c/o itching in areas of bites and other parts of body also (no rash or tick bite)  tx plan: Zyrtec 10 mg daily  Add benadryl at night only if needed pred 30 mg taper (discussed side eff to expect with this)  Seems to be multifactorial Also in setting of severe stress/unexpected death of son Update if not starting to improve in a week or if worsening   Consider allergist ref if no imp or if symptoms return         Other   Insect bite - Primary    Started with multiple insect bites on legs bilat Now more diffuse itching Rev notes from UC  tx with zyrtec  Prednisone (rev side eff) If worse or no imp consider allergy ref       Grief reaction    Sudden loss of son (cancer pt) at her home Overall doing ok /questions her ability to handle everything but very good support Offered help if needed/ counseling

## 2020-09-26 ENCOUNTER — Other Ambulatory Visit: Payer: Self-pay

## 2020-09-26 ENCOUNTER — Ambulatory Visit (INDEPENDENT_AMBULATORY_CARE_PROVIDER_SITE_OTHER): Payer: Medicare Other

## 2020-09-26 DIAGNOSIS — Z7901 Long term (current) use of anticoagulants: Secondary | ICD-10-CM | POA: Diagnosis not present

## 2020-09-26 LAB — POCT INR: INR: 3.2 — AB (ref 2.0–3.0)

## 2020-09-26 NOTE — Patient Instructions (Addendum)
Pre visit review using our clinic review tool, if applicable. No additional management support is needed unless otherwise documented below in the visit note.  Hold dose tomorrow and then continue 5mg  daily except 2.5mg  on Mondays and Fridays. Recheck INR in 4 week.

## 2020-09-27 DIAGNOSIS — Z20822 Contact with and (suspected) exposure to covid-19: Secondary | ICD-10-CM | POA: Diagnosis not present

## 2020-10-01 ENCOUNTER — Telehealth: Payer: Self-pay | Admitting: *Deleted

## 2020-10-01 NOTE — Telephone Encounter (Signed)
Is there a rash or visible whelps or bug bites (or new bites)?  Was she better at the higher dose of prednisone If so- I would send in the same course and have her start over and f/u after I get back. Otherwise please f/u with first available in person.  I am not back until Tuesday

## 2020-10-01 NOTE — Telephone Encounter (Signed)
Patient left a voicemail stating that the itching has started back. Patient stated that she is down to two prednisone pills a day now. Patient stated that the itching is emerging stronger and it is on her arms and legs. Patient wants to know what the next step would be. Pharmacy [facility]

## 2020-10-02 NOTE — Telephone Encounter (Signed)
Left VM requesting pt to call the office back 

## 2020-10-10 ENCOUNTER — Telehealth: Payer: Self-pay | Admitting: Family Medicine

## 2020-10-10 DIAGNOSIS — L299 Pruritus, unspecified: Secondary | ICD-10-CM

## 2020-10-10 DIAGNOSIS — L509 Urticaria, unspecified: Secondary | ICD-10-CM

## 2020-10-10 NOTE — Telephone Encounter (Signed)
Does she have any visible rash or whelps this time or is it just itching?  Any other new symptoms or insect bites?

## 2020-10-10 NOTE — Telephone Encounter (Signed)
Mrs. [name] called in wanted to know if she can get another script for itching due to the its back and it started off a little itching but now its full blown.

## 2020-10-10 NOTE — Telephone Encounter (Signed)
Called her back to find out what she is asking for. She would like a refill of prednisone.

## 2020-10-11 MED ORDER — PREDNISONE 10 MG PO TABS: ORAL_TABLET | ORAL | 0 refills | Status: DC

## 2020-10-11 NOTE — Telephone Encounter (Signed)
Pt said she doesn't have a rash but she dose get these spots that look like whelps. Pt said she was using ice and that did help some control the itching. Pt said she has a few main spots that break out in whelps and are itchy. The 1st spot is her cheek/jaw line. She said that place has some whelps but it looks more "raw" then anything also on that spot there is some tingling feelings too but no sxs of stroke. No facial droop, speech issues, weakness or confusion, pt said the tingling has been there for a while and it goes along with the itching. Pt said the 2nd spot is behind her ear, she said that is really itchy but she has been putting oils and creams behind her ears to try and help. The next spot is on her chest near her pacemaker it's itchy and red and there is one spot there that is always there and pretty itchy. The last spot is her legs they itch also, she has been using lidocaine cream there and that seems to help her legs some.

## 2020-10-11 NOTE — Telephone Encounter (Signed)
I pended the prednisone to send Also want to refer to allergist to see what they think Does she prefer [place] or [place] ? Cannot do prednisone long term  Thanks for letting me know

## 2020-10-11 NOTE — Telephone Encounter (Signed)
Pt notified of Dr. Marliss Coots comments and Rx sent to pharmacy. Pt agrees with allergist referral she said either city is fine.  Pt did let me know that she has been having some vision issues and drooling that she thought about after I went over stroke sxs. Pt said it's been going on for a while now nothing acute but wanted PCP to be aware. Spoke with Dr. Glori Bickers and since sxs are not acute okay to wait till Monday for an appt. ER precautions given and stroke sxs discussed pt verbalized understanding .

## 2020-10-11 NOTE — Telephone Encounter (Signed)
Referral is done I will see her then

## 2020-10-14 ENCOUNTER — Encounter: Payer: Self-pay | Admitting: Family Medicine

## 2020-10-14 ENCOUNTER — Other Ambulatory Visit: Payer: Self-pay

## 2020-10-14 ENCOUNTER — Ambulatory Visit (INDEPENDENT_AMBULATORY_CARE_PROVIDER_SITE_OTHER): Payer: Medicare Other | Admitting: Family Medicine

## 2020-10-14 VITALS — BP 140/78 | HR 78 | Temp 97.7°F | Ht 64.0 in | Wt 153.1 lb

## 2020-10-14 DIAGNOSIS — L299 Pruritus, unspecified: Secondary | ICD-10-CM

## 2020-10-14 DIAGNOSIS — R202 Paresthesia of skin: Secondary | ICD-10-CM | POA: Diagnosis not present

## 2020-10-14 NOTE — Progress Notes (Signed)
Subjective:    Patient ID: [name], female    DOB: 12/31/1932, 87 y.o.   MRN: [id number]  This visit occurred during the SARS-CoV-2 public health emergency.  Safety protocols were in place, including screening questions prior to the visit, additional usage of staff PPE, and extensive cleaning of exam room while observing appropriate contact time as indicated for disinfecting solutions.   HPI Pt presents with vision issues and drooling along with ongoing itching   Wt Readings from Last 3 Encounters:  10/14/20 153 lb 1 oz (69.4 kg)  09/20/20 149 lb (67.6 kg)  08/15/20 145 lb (65.8 kg)   26.27 kg/m  Subtle tingling on L side of the face  Not numbness , very mild  From time to time has to wipe corner of her mouth   Also itches in that area  Used ice-helped   R eye-periph vision was funny for a while/ that is better now   She did get her tetanus shot - had that   Occ sinus headache-not bad  No head injury   Itching  Improved with prednisone and got worse  We sent in more prednisone and it is helping  Allergy ref done  Eyes itch also  Another taper of prednisone ordered [place] per pt   Takes coumadin Lab Results  Component Value Date   INR 3.2 (A) 09/26/2020   INR 2.7 08/29/2020   INR 1.8 (A) 08/01/2020      BP Readings from Last 3 Encounters:  10/14/20 140/78  09/20/20 118/62  05/27/20 (!) 153/79   Pulse Readings from Last 3 Encounters:  10/14/20 78  09/20/20 82  05/27/20 87    Last MRI brain : MR BRAIN WO CONTRAST (Accession [id number]) (Order [id number]) Imaging Date: 06/16/2019 Department: [facility] Released By/Authorizing: Recardo Evangelist, PA-C (auto-released)    Exam Status  Status  Final [99]   PACS Intelerad Image Link   Show images for MR BRAIN WO CONTRAST  Study Result  Narrative & Impression  CLINICAL DATA:  87 year old female with atrial fibrillation on warfarin. Code stroke presentation earlier today.  Possible right M3 branch occlusion on CTA/CTP today. Not a candidate for tPA.   EXAM: MRI HEAD WITHOUT CONTRAST   TECHNIQUE: Multiplanar, multiecho pulse sequences of the brain and surrounding structures were obtained without intravenous contrast.   COMPARISON:  CTA and CTP earlier today. Brain MRI 11/21/2018 and earlier.   FINDINGS: Brain: Confluent 3-4 cm area of mostly cortical restricted diffusion in the anterior right parietal lobe, post sensory and with some sensory strip involvement suspected (series 3, image 35). This nicely corresponds to the T-max abnormality on CTP earlier today. There is a focus of petechial hemorrhage, or less likely distal right MCA thrombus identified by SWI on series 8, image 65.   No other associated blood products. No mass effect.   No other restricted diffusion. A few scattered chronic microhemorrhages elsewhere in the brain are stable from last year, primarily in the posterior temporal and occipital lobes. Stable scattered and patchy bilateral white matter T2 and FLAIR hyperintensity. 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 No midline shift, mass effect, evidence of mass lesion, ventriculomegaly, extra-axial collection or acute intracranial hemorrhage. Cervicomedullary junction and pituitary are within normal limits.   Vascular: Major intracranial vascular flow voids are stable since last year. But there is a small posterior right MCA branch visible on FLAIR today (series 7, image 13).   Skull and upper cervical  spine: Normal for age visible cervical spine. Hyperostosis of the calvarium. Normal bone marrow signal.   Sinuses/Orbits: Stable, negative.   Other: Mastoids remain clear. Visible internal auditory structures appear normal.   IMPRESSION: 1. 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 2. Solitary focus of petechial hemorrhage versus distal  vessel thrombus, but no malignant hemorrhagic transformation or mass effect. 3. Otherwise stable chronic small vessel disease.    Patient Active Problem List   Diagnosis Date Noted   Facial paresthesia 10/14/2020   Insect bite 09/20/2020   Grief reaction 09/20/2020   Hearing loss 06/13/2020   Atrial fibrillation ([place]) 04/24/2020   Urethral caruncle 04/23/2020   Pelvic pain 03/11/2020   Routine general medical examination at a health care facility 03/11/2020   Elevated glucose 03/03/2020   Rectal bleeding 02/18/2020   History of TIA (transient ischemic attack) 02/18/2020   S/P AV nodal ablation 07/28/19 07/29/2019   S/P placement of cardiac pacemaker MDT 07/28/19 07/29/2019   AV block 07/28/2019   CVA (cerebral vascular accident) ([place]) 06/16/2019   Facial tingling 11/29/2018   Tremor of left hand 11/29/2018   Medicare annual wellness visit, subsequent 11/24/2018   Dysuria 02/06/2018   Iron deficiency anemia 10/21/2017   Rapid atrial fibrillation ([place]) 08/19/2017   Constipation 08/02/2017   Numbness and tingling 07/14/2017   Paresthesia 07/14/2017   Unilateral primary osteoarthritis, left hip 05/04/2017   Status post total replacement of left hip 05/04/2017   Hip osteoarthritis 04/27/2017   Long term (current) use of anticoagulants 03/04/2017   Venous stasis dermatitis of both lower extremities 01/08/2017   Impacted cerumen of right ear 11/20/2016   Osteopenia 10/25/2016   Pedal edema 08/26/2016   Varicose veins of both lower extremities 08/26/2016   Estrogen deficiency 08/26/2016   Screening mammogram, encounter for 08/26/2016   Hemorrhoids 08/26/2016   History of nonmelanoma skin cancer 01/01/2016   Pruritus 10/04/2015   Urticaria 08/22/2014   Hip pain 08/02/2014   Left knee pain 08/02/2014   Chronic cough 05/08/2014   Hematochezia 04/09/2014   Colon cancer screening 08/16/2013   Encounter for therapeutic drug monitoring 04/20/2013   Left ovarian cyst 03/14/2013    Palpitations 04/15/2012   COLONIC POLYPS, ADENOMATOUS, HX OF 09/18/2009   PULMONARY NODULE 12/20/2008   GANGLION CYST 10/04/2007   Mixed incontinence 04/28/2007   Hyperlipidemia 04/27/2007   Depression with anxiety 04/27/2007   Asthma, mild intermittent 04/27/2007   INSOMNIA 04/27/2007   ADENOMATOUS COLONIC POLYP 11/04/2006   Hypothyroidism 09/02/2006   Atrial fibrillation, chronic ([place]) 08/05/2006   Past Medical History:  Diagnosis Date   Allergic rhinitis    Alopecia 2/2 beta blockers    Anemia    Arthritis    Atrial fibrillation -persistent cardiologist-  dr klein/  primary EP -- dr Tawanna Sat ([facility])   a. s/p PVI [name] 2010;  b. on tikosyn/coumadin;  c. 05/2009 Echo: EF 60-65%, Gr 2 DD. (first dx 09/ 2007)   Bilateral lower extremity edema    Bleeding hemorrhoid    Carotid stenosis    mild (hosp 3/11)- consult by vasc/ Dr Donnetta Hutching   Complication of anesthesia    hard to wake   Diverticulosis of colon    Dyspnea    on exertion-climbing stairs   Fatty liver    H/O cardiac radiofrequency ablation    01/ 2008 at [place] /  03/ 2010  at [facility] failure with preserved ejection  fraction [facility])    History of adenomatous polyp of colon    tubular adenoma's   History of cardiomyopathy    secondary tachycardia-induced cardiomyopathy -- resolved 2014   History of squamous cell carcinoma in situ (SCCIS) of skin    05/ 2017  nasal bridge and right medial knee   History of transient ischemic attack (TIA)    01-24-2005 and 06-12-2009   Hyperlipidemia    Hypothyroidism    Mild intermittent asthma    reacts to cats   Mixed stress and urge urinary incontinence    Presence of permanent cardiac pacemaker    was put in 07/2019   Pulmonary nodule    S/P AV nodal ablation 07/28/19 07/29/2019   S/P mitral valve repair 10-23-1998  dr Boyce Medici at [facility]   for MVP and regurg. (annuloplasty ring procedure)   S/P placement of cardiac pacemaker MDT 07/28/19 07/29/2019    Past Surgical History:  Procedure Laterality Date   APPENDECTOMY  1978   AV NODE ABLATION N/A 07/28/2019   Procedure: AV NODE ABLATION;  Surgeon: Deboraha Sprang, MD;  Location: [place] CV LAB;  Service: Cardiovascular;  Laterality: N/A;   BUBBLE STUDY  06/19/2019   Procedure: BUBBLE STUDY;  Surgeon: Pixie Casino, MD;  Location: [facility] ENDOSCOPY;  Service: Cardiovascular;;   CARDIAC ELECTROPHYSIOLOGY [place]  01/ 2008    at [place]   right-sided ablation atrial flutter   [place]  03/ 2010   dr Jaymes Graff at [facility]   AV node ablation and pulmonary vein isolation for atrial fib   CARDIOVERSION  06-18-2006;  07-13-2006;  10-19-2010;  10-27-2010   COLONOSCOPY     COLONOSCOPY WITH PROPOFOL N/A 10/13/2017   Procedure: COLONOSCOPY WITH PROPOFOL;  Surgeon: [name], MD;  Location: [facility] ENDOSCOPY;  Service: Gastroenterology;  Laterality: N/A;   COLONOSCOPY WITH PROPOFOL N/A 02/20/2020   Procedure: COLONOSCOPY WITH PROPOFOL;  Surgeon: Lesly Rubenstein, MD;  Location: [facility] ENDOSCOPY;  Service: Endoscopy;  Laterality: N/A;   CYSTO/ TRANSURETHRAL COLLAGEN INJECTION THERAPY  07-26-2007   dr Matilde Sprang   DILATION AND CURETTAGE OF UTERUS     ESOPHAGOGASTRODUODENOSCOPY (EGD) WITH PROPOFOL N/A 10/13/2017   Procedure: ESOPHAGOGASTRODUODENOSCOPY (EGD) WITH PROPOFOL;  Surgeon: [name], MD;  Location: [facility] ENDOSCOPY;  Service: Gastroenterology;  Laterality: N/A;   EVALUATION UNDER ANESTHESIA WITH HEMORRHOIDECTOMY N/A 04/09/2020   Procedure: EXAM UNDER ANESTHESIA WITH HEMORRHOIDECTOMY;  Surgeon: Jules Husbands, MD;  Location: [facility] ORS;  Service: General;  Laterality: N/A;   EXCISIONAL HEMORRHOIDECTOMY  1980s   GIVENS CAPSULE STUDY N/A 12/08/2017   Procedure: GIVENS CAPSULE STUDY;  Surgeon: [name], MD;  Location: [facility] ENDOSCOPY;  Service: Gastroenterology;  Laterality: N/A;   HEMORRHOID SURGERY N/A 10/29/2016   Procedure: HEMORRHOIDECTOMY;   Surgeon: Leighton Ruff, MD;  Location: [facility];  Service: General;  Laterality: N/A;   MITRAL VALVE ANNULOPLASTY  10/23/1998   "Model 4625; [facility] [id number]"; [facility] 32[facility]  PACEMAKER IMPLANT N/A 07/28/2019   Procedure: PACEMAKER IMPLANT;  Surgeon: KDeboraha Sprang MD;  [facility];  Service: Cardiovascular;  Laterality: N/A;   [place] [facility]  [facility]  TEE WITHOUT CARDIOVERSION N/A 06/19/2019   Procedure: TRANSESOPHAGEAL ECHOCARDIOGRAM (TEE);  Surgeon: HPixie Casino MD;  [place]  Service: Cardiovascular;  Laterality: N/A;   TOTAL HIP ARTHROPLASTY Left 05/04/2017  Procedure: LEFT TOTAL HIP ARTHROPLASTY ANTERIOR APPROACH;  Surgeon: Mcarthur Rossetti, MD;  Location: [place];  Service: Orthopedics;  Laterality: Left;   TRANSTHORACIC ECHOCARDIOGRAM  05-01-2015   dr Caryl Comes   ef 50-55%/  mild AV sclerosis without stenosis/  post MV repair with mild central MR (valve area by pressure half-time 2cm^2,  valve area by continutity equation 0.91cm^2, peak grandiant 53mHg)/  severe LAE/ mild TR/ mild RAE    TUBAL LIGATION Bilateral 1978   Social History   Tobacco Use   Smoking status: Never   Smokeless tobacco: Never  Vaping Use   Vaping Use: Never used  Substance Use Topics   Alcohol use: Yes    Alcohol/week: 0.0 standard drinks    Comment: seldom   Drug use: No   Family History  Problem Relation Age of Onset   Lung cancer Father        smoker, died at 845  Alcohol abuse Father    Cancer Father        bladder and lung CA smoker   Sudden death Other    Breast cancer Neg Hx    Stroke Neg Hx    Allergies  Allergen Reactions   Amiodarone Swelling    SWELLING REACTION UNSPECIFIED    Penicillins Hives and Rash    Has patient had a PCN reaction causing immediate rash, facial/tongue/throat swelling, SOB or lightheadedness with hypotension:  No Has patient had a PCN reaction causing severe rash involving mucus membranes or skin necrosis: No Has patient had a PCN reaction that required hospitalization:Patient was inpatient when reaction occurred Has patient had a PCN reaction occurring within the last 10 years: No If all of the above answers are "NO", then may proceed with Cephalosporin use   Amiodarone Hcl Swelling    SWELLING REACTION UNSPECIFIED    Statins Rash    REACTION: rash   Current Outpatient Medications on File Prior to Visit  Medication Sig Dispense Refill   acetaminophen (TYLENOL) 500 MG tablet Take 500 mg by mouth every 6 (six) hours as needed for moderate pain.     alendronate (FOSAMAX) 70 MG tablet TAKE 1 TABLET EVERY 7 DAYS. TAKE WITH A FULL GLASS OF WATER ON AN EMPTY STOMACH. 12 tablet 3   Ascorbic Acid (VITAMIN C) 1000 MG tablet Take 1,000 mg by mouth daily.     diclofenac Sodium (VOLTAREN) 1 % GEL Apply 1 application topically 4 (four) times daily as needed (pain).     diphenhydrAMINE (BENADRYL) 25 mg capsule Take 25 mg by mouth every 6 (six) hours as needed for itching.     diphenhydrAMINE-zinc acetate (BENADRYL) cream Apply 1 application topically 3 (three) times daily as needed for itching.     ezetimibe (ZETIA) 10 MG tablet Take 1 tablet (10 mg total) by mouth daily. 90 tablet 3   ferrous sulfate 325 (65 FE) MG EC tablet TAKE 1 TABLET EVERY DAY WITH BREAKFAST 90 tablet 1   fluticasone (FLONASE) 50 MCG/ACT nasal spray Place 1 spray into both nostrils daily as needed for allergies.     furosemide (LASIX) 40 MG tablet Take 40 mg by mouth daily as needed for edema.     Ketotifen Fumarate (EYE ITCH RELIEF OP) Place 1 drop into both eyes daily as needed (allergies).     levothyroxine (SYNTHROID) 50 MCG tablet Take 1 tablet (50 mcg total) by mouth daily. 90 tablet 3   Polyethyl Glycol-Propyl Glycol 0.4-0.3 % SOLN Place 1-2 drops into both eyes  3 (three) times daily as needed (for dry eyes.).     warfarin  (COUMADIN) 5 MG tablet TAKE 1 TABLET DAILY EXCEPT 1/2 TABLET ON MONDAYS AND FRIDAYS OR AS DIRECTED BY COUMADIN CLINIC 90 tablet 0   No current facility-administered medications on file prior to visit.     Review of Systems  Constitutional:  Negative for activity change, appetite change, fatigue, fever and unexpected weight change.  HENT:  Negative for congestion, ear pain, rhinorrhea, sinus pressure and sore throat.   Eyes:  Negative for pain, redness and visual disturbance.  Respiratory:  Negative for cough, shortness of breath and wheezing.   Cardiovascular:  Negative for chest pain and palpitations.  Gastrointestinal:  Negative for abdominal pain, blood in stool, constipation and diarrhea.  Endocrine: Negative for polydipsia and polyuria.  Genitourinary:  Negative for dysuria, frequency and urgency.  Musculoskeletal:  Negative for arthralgias, back pain and myalgias.  Skin:  Negative for pallor and rash.  Allergic/Immunologic: Negative for environmental allergies.  Neurological:  Negative for dizziness, tremors, seizures, syncope, speech difficulty, weakness, numbness and headaches.       Tingling R face    Hematological:  Negative for adenopathy. Does not bruise/bleed easily.  Psychiatric/Behavioral:  Negative for decreased concentration and dysphoric mood. The patient is not nervous/anxious.       Objective:   Physical Exam Constitutional:      General: She is not in acute distress.    Appearance: Normal appearance. She is well-developed and normal weight. She is not ill-appearing or diaphoretic.  HENT:     Head: Normocephalic and atraumatic.  Eyes:     General: No visual field deficit.    Conjunctiva/sclera: Conjunctivae normal.     Pupils: Pupils are equal, round, and reactive to light.  Neck:     Thyroid: No thyromegaly.     Vascular: No carotid bruit or JVD.  Cardiovascular:     Rate and Rhythm: Normal rate and regular rhythm.     Heart sounds: Normal heart sounds.     No gallop.  Pulmonary:     Effort: Pulmonary effort is normal. No respiratory distress.     Breath sounds: Normal breath sounds. No wheezing or rales.  Abdominal:     General: Bowel sounds are normal. There is no distension or abdominal bruit.     Palpations: Abdomen is soft. There is no mass.     Tenderness: There is no abdominal tenderness.  Musculoskeletal:     Cervical back: Normal range of motion and neck supple.     Right lower leg: No edema.     Left lower leg: No edema.  Lymphadenopathy:     Cervical: No cervical adenopathy.  Skin:    General: Skin is warm and dry.     Coloration: Skin is not pale.     Findings: No rash.     Comments: Superficial bruises on arms from anticoagulation   Skin is thin  Few excoriations on chest No whelps  Neurological:     Mental Status: She is alert.     Cranial Nerves: Cranial nerves are intact. No cranial nerve deficit, dysarthria or facial asymmetry.     Sensory: Sensation is intact.     Motor: Motor function is intact. No tremor or pronator drift.     Coordination: Romberg sign negative. Coordination normal. Finger-Nose-Finger Test normal.     Gait: Gait is intact.     Deep Tendon Reflexes: Reflexes are normal and symmetric. Reflexes normal.  Comments: No focal neuro findings   Psychiatric:        Mood and Affect: Mood normal.          Assessment & Plan:   Problem List Items Addressed This Visit       Musculoskeletal and Integument   Pruritus    This re occurred after round of prednisone and now with another taper is better  Per pt- whelps/hives -not present today  Ref to allergy done  Disc strategy when out of prednisone to take an antihistamine to get by until appt  ER precautions discussed         Other   Facial paresthesia - Primary    R sided -whole face and no motor def but does note drool from R corner of mouth occasionally  Nl exam today-very reassuring  No facial asymmetry  Given location of  discomfort-will check ESR and CRP to check for temporal arteritis  Pt does question whether this could be somatization since her son died recently        Relevant Orders   Sedimentation Rate   C-reactive protein

## 2020-10-14 NOTE — Assessment & Plan Note (Signed)
R sided -whole face and no motor def but does note drool from R corner of mouth occasionally  Nl exam today-very reassuring  No facial asymmetry  Given location of discomfort-will check ESR and CRP to check for temporal arteritis  Pt does question whether this could be somatization since her son died recently

## 2020-10-14 NOTE — Patient Instructions (Signed)
Labs today to rule out temporal arteritis  Use a cold compress as needed   We will let you know about results   Take care of yourself  Stress may make everything worse   Finish the prednisone You will get a call about an allergist referral   If symptoms worsen please let me know  If you develop facial droop or weakness or other stroke symptoms then call [phone]

## 2020-10-14 NOTE — Assessment & Plan Note (Signed)
This re occurred after round of prednisone and now with another taper is better  Per pt- whelps/hives -not present today  Ref to allergy done  Disc strategy when out of prednisone to take an antihistamine to get by until appt  ER precautions discussed

## 2020-10-15 LAB — SEDIMENTATION RATE: Sed Rate: 55 mm/hr — ABNORMAL HIGH (ref 0–30)

## 2020-10-15 LAB — C-REACTIVE PROTEIN: CRP: <1 mg/dL (ref 0.5–20.0)

## 2020-10-24 ENCOUNTER — Other Ambulatory Visit: Payer: Self-pay

## 2020-10-24 ENCOUNTER — Ambulatory Visit (INDEPENDENT_AMBULATORY_CARE_PROVIDER_SITE_OTHER): Payer: Medicare Other

## 2020-10-24 DIAGNOSIS — Z7901 Long term (current) use of anticoagulants: Secondary | ICD-10-CM | POA: Diagnosis not present

## 2020-10-24 LAB — CUP PACEART REMOTE DEVICE CHECK
Battery Remaining Longevity: 122 mo
Battery Voltage: 3.04 V
Brady Statistic AP VP Percent: 0 %
Brady Statistic AP VS Percent: 0 %
Brady Statistic AS VP Percent: 97.76 %
Brady Statistic AS VS Percent: 2.24 %
Brady Statistic RA Percent Paced: 0 %
Brady Statistic RV Percent Paced: 97.76 %
Date Time Interrogation Session: 20220801212031
Implantable Lead Implant Date: 20210507
Implantable Lead Location: 753860
Implantable Lead Model: 5076
Implantable Pulse Generator Implant Date: 20210507
Lead Channel Impedance Value: 3306 Ohm
Lead Channel Impedance Value: 3306 Ohm
Lead Channel Impedance Value: 380 Ohm
Lead Channel Impedance Value: 437 Ohm
Lead Channel Pacing Threshold Amplitude: 0.5 V
Lead Channel Pacing Threshold Pulse Width: 0.4 ms
Lead Channel Sensing Intrinsic Amplitude: 1.875 mV
Lead Channel Sensing Intrinsic Amplitude: 9.25 mV
Lead Channel Sensing Intrinsic Amplitude: 9.25 mV
Lead Channel Setting Pacing Amplitude: 2.5 V
Lead Channel Setting Pacing Pulse Width: 0.4 ms
Lead Channel Setting Sensing Sensitivity: 4 mV

## 2020-10-24 LAB — POCT INR: INR: 2.2 (ref 2.0–3.0)

## 2020-10-24 NOTE — Patient Instructions (Addendum)
Pre visit review using our clinic review tool, if applicable. No additional management support is needed unless otherwise documented below in the visit note.  Continue '5mg'$  daily except 2.'5mg'$  on Mondays and Fridays. Recheck INR in 4 week.

## 2020-10-25 ENCOUNTER — Ambulatory Visit (INDEPENDENT_AMBULATORY_CARE_PROVIDER_SITE_OTHER): Payer: Medicare Other

## 2020-10-25 DIAGNOSIS — I4821 Permanent atrial fibrillation: Secondary | ICD-10-CM

## 2020-11-05 ENCOUNTER — Emergency Department (HOSPITAL_COMMUNITY)
Admission: EM | Admit: 2020-11-05 | Discharge: 2020-11-06 | Disposition: A | Discharge status: Home / Self Care | Payer: Medicare Other | Attending: Emergency Medicine | Admitting: Emergency Medicine

## 2020-11-05 ENCOUNTER — Other Ambulatory Visit: Payer: Self-pay

## 2020-11-05 ENCOUNTER — Emergency Department (HOSPITAL_COMMUNITY): Payer: Medicare Other

## 2020-11-05 DIAGNOSIS — E039 Hypothyroidism, unspecified: Secondary | ICD-10-CM | POA: Insufficient documentation

## 2020-11-05 DIAGNOSIS — Z7901 Long term (current) use of anticoagulants: Secondary | ICD-10-CM | POA: Insufficient documentation

## 2020-11-05 DIAGNOSIS — Z96642 Presence of left artificial hip joint: Secondary | ICD-10-CM | POA: Diagnosis not present

## 2020-11-05 DIAGNOSIS — Z950 Presence of cardiac pacemaker: Secondary | ICD-10-CM | POA: Insufficient documentation

## 2020-11-05 DIAGNOSIS — Z79899 Other long term (current) drug therapy: Secondary | ICD-10-CM | POA: Diagnosis not present

## 2020-11-05 DIAGNOSIS — R000 Tachycardia, unspecified: Secondary | ICD-10-CM | POA: Diagnosis not present

## 2020-11-05 DIAGNOSIS — J4520 Mild intermittent asthma, uncomplicated: Secondary | ICD-10-CM | POA: Insufficient documentation

## 2020-11-05 DIAGNOSIS — I491 Atrial premature depolarization: Secondary | ICD-10-CM | POA: Diagnosis not present

## 2020-11-05 DIAGNOSIS — I4891 Unspecified atrial fibrillation: Secondary | ICD-10-CM | POA: Insufficient documentation

## 2020-11-05 DIAGNOSIS — R404 Transient alteration of awareness: Secondary | ICD-10-CM | POA: Diagnosis not present

## 2020-11-05 DIAGNOSIS — I10 Essential (primary) hypertension: Secondary | ICD-10-CM | POA: Diagnosis not present

## 2020-11-05 DIAGNOSIS — R4182 Altered mental status, unspecified: Secondary | ICD-10-CM | POA: Diagnosis present

## 2020-11-05 DIAGNOSIS — R410 Disorientation, unspecified: Secondary | ICD-10-CM | POA: Diagnosis not present

## 2020-11-05 DIAGNOSIS — R9431 Abnormal electrocardiogram [ECG] [EKG]: Secondary | ICD-10-CM | POA: Diagnosis not present

## 2020-11-05 LAB — CBC WITH DIFFERENTIAL/PLATELET
Abs Immature Granulocytes: 0.01 10*3/uL (ref 0.00–0.07)
Basophils Absolute: 0 10*3/uL (ref 0.0–0.1)
Basophils Relative: 1 %
Eosinophils Absolute: 0.2 10*3/uL (ref 0.0–0.5)
Eosinophils Relative: 3 %
HCT: 42.6 % (ref 36.0–46.0)
Hemoglobin: 13.2 g/dL (ref 12.0–15.0)
Immature Granulocytes: 0 %
Lymphocytes Relative: 30 %
Lymphs Abs: 1.4 10*3/uL (ref 0.7–4.0)
MCH: 28.3 pg (ref 26.0–34.0)
MCHC: 31 g/dL (ref 30.0–36.0)
MCV: 91.4 fL (ref 80.0–100.0)
Monocytes Absolute: 0.4 10*3/uL (ref 0.1–1.0)
Monocytes Relative: 8 %
Neutro Abs: 2.6 10*3/uL (ref 1.7–7.7)
Neutrophils Relative %: 58 %
Platelets: 136 10*3/uL — ABNORMAL LOW (ref 150–400)
RBC: 4.66 MIL/uL (ref 3.87–5.11)
RDW: 16.4 % — ABNORMAL HIGH (ref 11.5–15.5)
WBC: 4.5 10*3/uL (ref 4.0–10.5)
nRBC: 0 % (ref 0.0–0.2)

## 2020-11-05 LAB — COMPREHENSIVE METABOLIC PANEL
ALT: 48 U/L — ABNORMAL HIGH (ref 0–44)
AST: 39 U/L (ref 15–41)
Albumin: 3.8 g/dL (ref 3.5–5.0)
Alkaline Phosphatase: 46 U/L (ref 38–126)
Anion gap: 6 (ref 5–15)
BUN: 19 mg/dL (ref 8–23)
CO2: 30 mmol/L (ref 22–32)
Calcium: 9.1 mg/dL (ref 8.9–10.3)
Chloride: 106 mmol/L (ref 98–111)
Creatinine, Ser: 0.65 mg/dL (ref 0.44–1.00)
GFR, Estimated: >60 mL/min (ref >60)
Glucose, Bld: 90 mg/dL (ref 70–99)
Potassium: 4.5 mmol/L (ref 3.5–5.1)
Sodium: 142 mmol/L (ref 135–145)
Total Bilirubin: 0.6 mg/dL (ref 0.3–1.2)
Total Protein: 6.9 g/dL (ref 6.5–8.1)

## 2020-11-05 LAB — URINALYSIS, ROUTINE W REFLEX MICROSCOPIC
Bilirubin Urine: NEGATIVE
Glucose, UA: NEGATIVE mg/dL
Ketones, ur: NEGATIVE mg/dL
Nitrite: NEGATIVE
Protein, ur: NEGATIVE mg/dL
Specific Gravity, Urine: 1.006 (ref 1.005–1.030)
pH: 7 (ref 5.0–8.0)

## 2020-11-05 NOTE — ED Triage Notes (Signed)
Reports "brain fog" for the past 1-2 weeks - having difficulty in finding words, feeling unsteady on both feet.  Denies any slurring of words, one sided weakness.     Pt on warfarin

## 2020-11-05 NOTE — ED Provider Notes (Signed)
Emergency Medicine Provider Triage Evaluation Note  [name] , a 87 y.o. female  was evaluated in triage.  Pt complains of confusion.  Pt reports trouble finding works, hx of a brain bleed.   Review of Systems  Positive: Confusion  Negative: No fever  Physical Exam  BP (!) 171/90 (BP Location: Right Arm)   Pulse 71   Temp 98 F (36.7 C) (Oral)   Resp 18   Ht '5\' 4"'$  (1.626 m)   Wt 69.4 kg   SpO2 97%   BMI 26.26 kg/m  Gen:   Awake, no distress   Resp:  Normal effort  MSK:   Moves extremities without difficulty  Other:    Medical Decision Making  Medically screening exam initiated at 8:36 PM.  Appropriate orders placed.  KAELY HARTMAN was informed that the remainder of the evaluation will be completed by another provider, this initial triage assessment does not replace that evaluation, and the importance of remaining in the ED until their evaluation is complete.     Fransico Meadow, Hershal Coria 11/05/20 2037    Jeanell Sparrow, DO 11/06/20 [phone]

## 2020-11-06 DIAGNOSIS — R404 Transient alteration of awareness: Secondary | ICD-10-CM | POA: Diagnosis not present

## 2020-11-06 LAB — FOLATE: Folate: 15.1 ng/mL (ref >5.9)

## 2020-11-06 LAB — AMMONIA: Ammonia: 12 umol/L (ref 9–35)

## 2020-11-06 LAB — VITAMIN B12: Vitamin B-12: 448 pg/mL (ref 180–914)

## 2020-11-06 LAB — TSH: TSH: 5.131 u[IU]/mL — ABNORMAL HIGH (ref 0.350–4.500)

## 2020-11-06 NOTE — ED Provider Notes (Signed)
[place] EMERGENCY DEPARTMENT Provider Note   CSN: [id number] Arrival date & time: 11/05/20  2017     History Chief Complaint  Patient presents with   Altered Mental Status    [name] is a 87 y.o. female with PMH significant for Afib, previous brain bleed, hypothyroidism who reports with a few week long history of brain fog, unsteadiness on feet, some difficulty with coordination after an encounter with an induction stove. The patient reports the stove has a large magnet and that when she walked near it several times she felt an overwhelming pressure on her head, had to leave the room, and confusion. The patient believes this has something to do with her pacemaker which was placed one year ago. She has had multiple events where she feels some internal tremors, suddenly loses her train of thought, but recovers shortly thereafter. She also endorses some uncertainty in her gait, stating she has less confidence in her steps, with her fine motor movements. The patient has not had difficulty with speech, no new facial muscle weakness, no recent falls, no dizziness. Patient denies chest pain, shortness of breath, abdominal pain, dysuria. The patient reports she lost her son 2 months ago and that she was the one who found him dead. She feels like she has had some melancholy and anxiety since this event. The patient is here because she does not want to be an invalid and wants to make sure there isn't anything going on.  Altered Mental Status Presenting symptoms: confusion   Associated symptoms: no abdominal pain, no fever, no headaches, no light-headedness, no palpitations and no weakness       Past Medical History:  Diagnosis Date   Allergic rhinitis    Alopecia 2/2 beta blockers    Anemia    Arthritis    Atrial fibrillation -persistent cardiologist-  dr klein/  primary EP -- dr Tawanna Sat ([facility])   a. s/p PVI [name] 2010;  b. on tikosyn/coumadin;  c. 05/2009 Echo: EF  60-65%, Gr 2 DD. (first dx 09/ 2007)   Bilateral lower extremity edema    Bleeding hemorrhoid    Carotid stenosis    mild (hosp 3/11)- consult by vasc/ Dr Donnetta Hutching   Complication of anesthesia    hard to wake   Diverticulosis of colon    Dyspnea    on exertion-climbing stairs   Fatty liver    H/O cardiac radiofrequency ablation    01/ 2008 at [facility] /  03/ 2010  at [facility] failure with preserved ejection fraction [facility])    History of adenomatous polyp of colon    tubular adenoma's   History of cardiomyopathy    secondary tachycardia-induced cardiomyopathy -- resolved 2014   History of squamous cell carcinoma in situ (SCCIS) of skin    05/ 2017  nasal bridge and right medial knee   History of transient ischemic attack (TIA)    01-24-2005 and 06-12-2009   Hyperlipidemia    Hypothyroidism    Mild intermittent asthma    reacts to cats   Mixed stress and urge urinary incontinence    Presence of permanent cardiac pacemaker    was put in 07/2019   Pulmonary nodule    S/P AV nodal ablation 07/28/19 07/29/2019   S/P mitral valve repair 10-23-1998  dr Boyce Medici at [facility]   for MVP and regurg. (annuloplasty ring procedure)   S/P placement of cardiac pacemaker MDT 07/28/19 07/29/2019  Patient Active Problem List   Diagnosis Date Noted   Facial paresthesia 10/14/2020   Insect bite 09/20/2020   Grief reaction 09/20/2020   Hearing loss 06/13/2020   Atrial fibrillation ([place]) 04/24/2020   Urethral caruncle 04/23/2020   Pelvic pain 03/11/2020   Routine general medical examination at a health care facility 03/11/2020   Elevated glucose 03/03/2020   Rectal bleeding 02/18/2020   History of TIA (transient ischemic attack) 02/18/2020   S/P AV nodal ablation 07/28/19 07/29/2019   S/P placement of cardiac pacemaker MDT 07/28/19 07/29/2019   AV block 07/28/2019   CVA (cerebral vascular accident) ([place]) 06/16/2019   Facial tingling 11/29/2018   Tremor of left hand 11/29/2018    Medicare annual wellness visit, subsequent 11/24/2018   Dysuria 02/06/2018   Iron deficiency anemia 10/21/2017   Rapid atrial fibrillation ([place]) 08/19/2017   Constipation 08/02/2017   Numbness and tingling 07/14/2017   Paresthesia 07/14/2017   Unilateral primary osteoarthritis, left hip 05/04/2017   Status post total replacement of left hip 05/04/2017   Hip osteoarthritis 04/27/2017   Long term (current) use of anticoagulants 03/04/2017   Venous stasis dermatitis of both lower extremities 01/08/2017   Impacted cerumen of right ear 11/20/2016   Osteopenia 10/25/2016   Pedal edema 08/26/2016   Varicose veins of both lower extremities 08/26/2016   Estrogen deficiency 08/26/2016   Screening mammogram, encounter for 08/26/2016   Hemorrhoids 08/26/2016   History of nonmelanoma skin cancer 01/01/2016   Pruritus 10/04/2015   Urticaria 08/22/2014   Hip pain 08/02/2014   Left knee pain 08/02/2014   Chronic cough 05/08/2014   Hematochezia 04/09/2014   Colon cancer screening 08/16/2013   Encounter for therapeutic drug monitoring 04/20/2013   Left ovarian cyst 03/14/2013   Palpitations 04/15/2012   COLONIC POLYPS, ADENOMATOUS, HX OF 09/18/2009   PULMONARY NODULE 12/20/2008   GANGLION CYST 10/04/2007   Mixed incontinence 04/28/2007   Hyperlipidemia 04/27/2007   Depression with anxiety 04/27/2007   Asthma, mild intermittent 04/27/2007   INSOMNIA 04/27/2007   ADENOMATOUS COLONIC POLYP 11/04/2006   Hypothyroidism 09/02/2006   Atrial fibrillation, chronic ([place]) 08/05/2006    Past Surgical History:  Procedure Laterality Date   APPENDECTOMY  1978   AV NODE ABLATION N/A 07/28/2019   Procedure: AV NODE ABLATION;  Surgeon: Deboraha Sprang, MD;  Location: [place] CV LAB;  Service: Cardiovascular;  Laterality: N/A;   BUBBLE STUDY  06/19/2019   Procedure: BUBBLE STUDY;  Surgeon: Pixie Casino, MD;  Location: [facility] ENDOSCOPY;  Service: Cardiovascular;;   CARDIAC ELECTROPHYSIOLOGY [place]  01/ 2008    at [place]   right-sided ablation atrial flutter   [place]  03/ 2010   dr Jaymes Graff [facility]   AV node ablation and pulmonary vein isolation for atrial fib   CARDIOVERSION  06-18-2006;  07-13-2006;  10-19-2010;  10-27-2010   COLONOSCOPY     COLONOSCOPY WITH PROPOFOL N/A 10/13/2017   Procedure: COLONOSCOPY WITH PROPOFOL;  Surgeon: [name], MD;  Location: [facility] ENDOSCOPY;  Service: Gastroenterology;  Laterality: N/A;   COLONOSCOPY WITH PROPOFOL N/A 02/20/2020   Procedure: COLONOSCOPY WITH PROPOFOL;  Surgeon: Lesly Rubenstein, MD;  Location: [facility] ENDOSCOPY;  Service: Endoscopy;  Laterality: N/A;   CYSTO/ TRANSURETHRAL COLLAGEN INJECTION THERAPY  07-26-2007   dr Matilde Sprang   DILATION AND CURETTAGE OF UTERUS     ESOPHAGOGASTRODUODENOSCOPY (EGD) WITH PROPOFOL N/A 10/13/2017   Procedure: ESOPHAGOGASTRODUODENOSCOPY (EGD) WITH PROPOFOL;  Surgeon: [name], MD;  Location: [facility] ENDOSCOPY;  Service: Gastroenterology;  Laterality: N/A;   EVALUATION UNDER ANESTHESIA WITH HEMORRHOIDECTOMY N/A 04/09/2020   Procedure: EXAM UNDER ANESTHESIA WITH HEMORRHOIDECTOMY;  Surgeon: Jules Husbands, MD;  Location: [facility] ORS;  Service: General;  Laterality: N/A;   EXCISIONAL HEMORRHOIDECTOMY  1980s   GIVENS CAPSULE STUDY N/A 12/08/2017   Procedure: GIVENS CAPSULE STUDY;  Surgeon: [name], MD;  [facility] END[facility]COPY;  Service: Gastroenterology;  Laterality: N/A;   HEMORRHOID SURGERY N/A 10/29/2016   Procedure: HEMORRHOIDECTOMY;  Surgeon: Leighton Ruff, MD;  [facility];  Service: General;  Laterality: N/A;   MITRAL VALVE ANNULOPLASTY  10/23/1998   "Model [id number]; Campbell Lerner [id number]"; [facility] 60m[facility]  PACEMAKER IMPLANT N/A 07/28/2019   Procedure: PACEMAKER IMPLANT;  Surgeon: KDeboraha Sprang MD;  [place] [facility];  Service: Cardiovascular;  Laterality: N/A;   [place]  TEE WITHOUT CARDIOVERSION N/A 06/19/2019   Procedure: TRANSESOPHAGEAL ECHOCARDIOGRAM (TEE);  Surgeon: HPixie Casino MD;  [facility];  Service: Cardiovascular;  Laterality: N/A;   TOTAL HIP ARTHROPLASTY Left 05/04/2017   Procedure: LEFT TOTAL HIP ARTHROPLASTY ANTERIOR APPROACH;  Surgeon: BMcarthur Rossetti MD;  [place]  Service: Orthopedics;  Laterality: Left;   TRANSTHORACIC ECHOCARDIOGRAM  05-01-2015   dr kCaryl Comes  ef 50-55%/  mild AV sclerosis without stenosis/  post MV repair with mild central MR (valve area by pressure half-time 2cm^2,  valve area by continutity equation 0.91cm^2, peak grandiant 814mg)/  severe LAE/ mild TR/ mild RAE    TUBAL LIGATION Bilateral 1978     OB History   No obstetric history on file.     Family History  Problem Relation Age of Onset   Lung cancer Father        smoker, died at 8054 Alcohol abuse Father    Cancer Father        bladder and lung CA smoker   Sudden death Other    Breast cancer Neg Hx    Stroke Neg Hx     Social History   Tobacco Use   Smoking status: Never   Smokeless tobacco: Never  Vaping Use   Vaping Use: Never used  Substance Use Topics   Alcohol use: Yes    Alcohol/week: 0.0 standard drinks    Comment: seldom   Drug use: No    Home Medications Prior to Admission medications   Medication Sig Start Date End Date Taking? Authorizing Provider  acetaminophen (TYLENOL) 500 MG tablet Take 500 mg by mouth every 6 (six) hours as needed for moderate pain.    [provider]  alendronate (FOSAMAX) 70 MG tablet TAKE 1 TABLET EVERY 7 DAYS. TAKE WITH A FULL GLASS OF WATER ON AN EMPTY STOMACH. 07/30/20   Tower, MaWynelle FannyMD  Ascorbic Acid (VITAMIN C) 1000 MG tablet Take 1,000 mg by mouth daily.    [provider]  diclofenac Sodium (VOLTAREN) 1 % GEL Apply 1 application topically 4 (four) times daily as needed (pain).     [provider]  diphenhydrAMINE (BENADRYL) 25 mg capsule Take 25 mg by mouth every 6 (six) hours as needed for itching.    [provider]  diphenhydrAMINE-zinc acetate (BENADRYL) cream Apply 1 application topically 3 (three) times daily as needed for itching.    [provider]  ezetimibe (ZETIA) 10  MG tablet Take 1 tablet (10 mg total) by mouth daily. 09/13/19   Deboraha Sprang, MD  ferrous sulfate 325 (65 FE) MG EC tablet TAKE 1 TABLET EVERY DAY WITH BREAKFAST 09/19/20   Tower, Wynelle Fanny, MD  fluticasone (FLONASE) 50 MCG/ACT nasal spray Place 1 spray into both nostrils daily as needed for allergies.    [provider]  furosemide (LASIX) 40 MG tablet Take 40 mg by mouth daily as needed for edema.    [provider]  Ketotifen Fumarate (EYE ITCH RELIEF OP) Place 1 drop into both eyes daily as needed (allergies).    [provider]  levothyroxine (SYNTHROID) 50 MCG tablet Take 1 tablet (50 mcg total) by mouth daily. 03/11/20   Tower, Wynelle Fanny, MD  Polyethyl Glycol-Propyl Glycol 0.4-0.3 % SOLN Place 1-2 drops into both eyes 3 (three) times daily as needed (for dry eyes.).    [provider]  warfarin (COUMADIN) 5 MG tablet TAKE 1 TABLET DAILY EXCEPT 1/2 TABLET ON MONDAYS AND FRIDAYS OR AS DIRECTED BY COUMADIN CLINIC 07/30/20   Tower, Wynelle Fanny, MD    Allergies    Amiodarone, Penicillins, Amiodarone hcl, and Statins  Review of Systems   Review of Systems  Constitutional:  Negative for appetite change, fatigue, fever and unexpected weight change.  HENT:  Negative for rhinorrhea, sore throat, trouble swallowing and voice change.   Eyes:  Negative for pain and visual disturbance.  Respiratory:  Positive for chest tightness. Negative for choking and shortness of breath.   Cardiovascular:  Negative for chest pain and palpitations.  Gastrointestinal:  Negative for abdominal pain, constipation and diarrhea.  Genitourinary:  Negative for  difficulty urinating and dysuria.  Musculoskeletal:  Positive for gait problem. Negative for neck pain and neck stiffness.  Neurological:  Positive for tremors. Negative for dizziness, syncope, facial asymmetry, speech difficulty, weakness, light-headedness, numbness and headaches.  Hematological:  Bruises/bleeds easily.  Psychiatric/Behavioral:  Positive for confusion.   All other systems reviewed and are negative.  Physical Exam Updated Vital Signs BP (!) 150/75   Pulse 70   Temp 98.1 F (36.7 C) (Oral)   Resp 12   Ht '5\' 4"'$  (1.626 m)   Wt 69.4 kg   SpO2 98%   BMI 26.26 kg/m   Physical Exam Vitals and nursing note reviewed.  Constitutional:      General: She is not in acute distress.    Appearance: Normal appearance.  HENT:     Head: Normocephalic and atraumatic.  Eyes:     General: No visual field deficit.       Right eye: No discharge.        Left eye: No discharge.  Cardiovascular:     Rate and Rhythm: Normal rate and regular rhythm.     Heart sounds: No murmur heard.   No friction rub. No gallop.  Pulmonary:     Effort: Pulmonary effort is normal.     Breath sounds: Normal breath sounds.  Abdominal:     General: Bowel sounds are normal.     Palpations: Abdomen is soft.  Skin:    General: Skin is warm and dry.     Capillary Refill: Capillary refill takes less than 2 seconds.  Neurological:     Mental Status: She is alert and oriented to person, place, and time.     GCS: GCS eye subscore is 4. GCS verbal subscore is 5. GCS motor subscore is 6.     Cranial Nerves: Cranial  nerves are intact. No dysarthria or facial asymmetry.     Sensory: Sensation is intact.     Motor: No weakness, tremor or atrophy.     Coordination: Romberg sign negative. Finger-Nose-Finger Test normal. Rapid alternating movements normal.     Deep Tendon Reflexes: Reflexes normal.  Psychiatric:        Mood and Affect: Mood normal.        Behavior: Behavior normal.        Thought Content:  Thought content normal.        Judgment: Judgment normal.    ED Results / Procedures / Treatments   Labs (all labs ordered are listed, but only abnormal results are displayed) Labs Reviewed  CBC WITH DIFFERENTIAL/PLATELET - Abnormal; Notable for the following components:      Result Value   RDW 16.4 (*)    Platelets 136 (*)    All other components within normal limits  COMPREHENSIVE METABOLIC PANEL - Abnormal; Notable for the following components:   ALT 48 (*)    All other components within normal limits  URINALYSIS, ROUTINE W REFLEX MICROSCOPIC - Abnormal; Notable for the following components:   Color, Urine STRAW (*)    Hgb urine dipstick MODERATE (*)    Leukocytes,Ua SMALL (*)    Bacteria, UA RARE (*)    All other components within normal limits  VITAMIN B12  TSH  FOLATE  AMMONIA    EKG EKG Interpretation  Date/Time:  Wednesday November 06 2020 03:44:22 EDT Ventricular Rate:  70 PR Interval:    QRS Duration: 190 QT Interval:  487 QTC Calculation: 526 R Axis:   -80 Text Interpretation: VENTRICULAR PACING Confirmed by Orpah Greek [phone]) on 11/06/2020 3:49:21 AM  Radiology CT HEAD WO CONTRAST (5MM)  Result Date: 11/05/2020 CLINICAL DATA:  Confusion EXAM: CT HEAD WITHOUT CONTRAST TECHNIQUE: Contiguous axial images were obtained from the base of the skull through the vertex without intravenous contrast. COMPARISON:  MRI brain dated 06/16/2019 FINDINGS: Brain: No evidence of acute infarction, hemorrhage, hydrocephalus, extra-axial collection or mass lesion/mass effect. Encephalomalacic changes related to right parietal infarct. Encephalomalacic changes related to small left occipital infarct. Mild subcortical white matter and periventricular small vessel ischemic changes. Vascular: No hyperdense vessel or unexpected calcification. Skull: Normal. Negative for fracture or focal lesion. Sinuses/Orbits: The visualized paranasal sinuses are essentially clear. The mastoid  air cells are unopacified. Other: None. IMPRESSION: No evidence of acute intracranial abnormality. 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 Mild small vessel ischemic changes. Electronically Signed   By: Julian Hy M.D.   On: 11/05/2020 21:19    Procedures Procedures   Medications Ordered in ED Medications - No data to display  ED Course  I have reviewed the triage vital signs and the nursing notes.  Pertinent labs & imaging results that were available during my care of the patient were reviewed by me and considered in my medical decision making (see chart for details).    MDM Rules/Calculators/A&P                         CT scan with no acute abnormalities. EKG shows paced rhythm. Patient clinically without any neurologic deficit, able to ambulate without difficulty. Some evidence of increased anxiety, depression given recent history of her son passing. Differential includes grief reaction. Description of intermittent loss of focus, confusion, and then return to baseline somewhat concerning for focal seizure activity especially in the setting of previous brain bleeds  and infarcts. Neurology consulted to discuss further workup and reported that based on our history and exam today, there is no need for hospitalization at this time, however they recommend that patient follow up for an ambulatory EEG, evaluation of TSH, B12.   Return precautions discussed and patient expressed understanding.  Final Clinical Impression(s) / ED Diagnoses Final diagnoses:  Transient alteration of awareness    Rx / DC Orders ED Discharge Orders          Ordered    Ambulatory referral to Neurology       Comments: An appointment is requested in approximately: 1 week   11/06/20 0435             Anselmo Pickler, PA-C 11/06/20 0450    Orpah Greek, MD 11/06/20 0500

## 2020-11-06 NOTE — ED Notes (Signed)
Pt back to room now. Ambulatory to restroom. Steady on feet.

## 2020-11-06 NOTE — ED Notes (Signed)
Report given to Alexis, RN

## 2020-11-06 NOTE — ED Notes (Signed)
Pt here because she has vague c/o r/t feeling like brain fog, feeling some generalized numbness and feeling like her head doesn't feel right and some intermittent dizziness. She said it started a month or so ago. She had a stove that ran on magnets and got really close to it and the sx just happened all of a sudden. She said that the stove instructions said not to get close to it if you have a pacemaker (she does). She said the sx always got stronger when she was near the stove. The stove has been removed from her house, but she's worried because she's still intermittently having these sx. Pt AxO x4. GCS 15. No one-sided body weakness.

## 2020-11-06 NOTE — ED Notes (Signed)
Patient verbalizes understanding of discharge instructions. Follow-up care reviewed. Opportunity for questioning and answers were provided. Armband removed by staff, pt discharged from ED ambulatory.  

## 2020-11-06 NOTE — ED Provider Notes (Signed)
Pt seen in conjunction with C Prosperi, PA-C. Please see his notes for full history, exam, and physical.   In brief, patient presenting for evaluation of intermittent episodes of confusion, internal trembling, and brain fog.  No current symptoms.  Previous history of hemorrhagic stroke.  Labs obtained from triage interpreted by me, overall reassuring.  No significant electrolyte abnormality.  Urine with no signs of infection, patient without urinary symptoms.  CT head obtained in triage negative for acute findings.  Additionally, patient's son recently died, patient found son dead on the floor.  Patient symptoms may be due to a acute stress/grief reaction.  However in the setting of intermittent neurologic symptoms, also consider focal seizures.  Will consult with neurology.  I discussed with Dr. Lorrin Goodell who recommends outpatient EEG in next 1-2 weeks. Referral sent. Additionally requests TSH, B12, ammonia, and folate labs.     Franchot Heidelberg, PA-C 11/06/20 0703    Orpah Greek, MD 11/14/20 [phone]

## 2020-11-06 NOTE — Discharge Instructions (Addendum)
As we discussed, there is no evidence of any new stroke or other changes on your head CT. Your other labwork and EKG are both reassuring. After speaking with neurology, they would like you to be further evaluated outpatient for an EEG and neurologist.   Please return to the ED if your condition worsens, or you have new onset stroke symptoms.

## 2020-11-14 NOTE — Progress Notes (Signed)
Remote pacemaker transmission.   

## 2020-11-15 NOTE — Progress Notes (Signed)
Electrophysiology Office Note   Date:  11/19/2020   ID:  [name], [name] September 15, 1932, MRN [id number]  PCP:  Abner Greenspan, MD  Cardiologist:   Primary Electrophysiologist:  Virl Axe, MD    No chief complaint on file.    History of Present Illness: [name] is a 87 y.o. female seen for atrial fibrillation.  Now permanent.  Anticoagulation with Eliquis.  And with AV junction ablation and pacing.    Stroke 2/21.  Subtherapeutic INR.  Switch to Eliquis back to [place] clinic calling rin.  Aspirin also added adjunctively.  Significant bruising  5/21 underwent AV ablation and pacing.  Energy level is significantly improved.  No edema.  Occasional intermittent chest discomfort.  The patient denies nocturnal dyspnea, orthopnea or peripheral edema.  There have been no palpitations, lightheadedness or syncope.  Complains of recent exposure to induction stove since then she "has felt the energy in other rooms.". Breathing and energy has been much better since AV ablation  Her daughter-in-law died about a year or 2 ago in her son, [name], about 4 months ago.  She found him dead in the home.    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 s/p PVI [name] 2010;  b. on tikosyn/coumadin;  c. 05/2009 Echo: EF 60-65%, Gr 2 DD. (first dx 09/ 2007)   Bilateral lower extremity edema    Bleeding hemorrhoid    Carotid stenosis    mild (hosp 3/11)- consult by vasc/ Dr Donnetta Hutching   Complication of anesthesia    hard to wake   Diverticulosis of colon    Dyspnea     on exertion-climbing stairs   Fatty liver    H/O cardiac radiofrequency ablation    01/ 2008 at [facility] /  03/ 2010  at [facility] failure with preserved ejection fraction [facility])    History of adenomatous polyp of colon    tubular adenoma's   History of cardiomyopathy    secondary tachycardia-induced cardiomyopathy -- resolved 2014   History of squamous cell carcinoma in situ (SCCIS) of skin    05/ 2017  nasal bridge and right medial knee   History of transient ischemic attack (TIA)    01-24-2005 and 06-12-2009   Hyperlipidemia    Hypothyroidism    Mild intermittent asthma    reacts to cats   Mixed stress and urge urinary incontinence    Presence of permanent cardiac pacemaker    was put in 07/2019   Pulmonary nodule    S/P AV nodal ablation 07/28/19 07/29/2019   S/P mitral valve repair 10-23-1998  dr Boyce Medici at [facility]   for MVP and regurg. (annuloplasty ring procedure)   S/P placement of cardiac pacemaker MDT 07/28/19 07/29/2019   Past Surgical History:  Procedure Laterality Date  APPENDECTOMY  1978   AV NODE ABLATION N/A 07/28/2019   Procedure: AV NODE ABLATION;  Surgeon: Deboraha Sprang, MD;  Location: [place] CV LAB;  Service: Cardiovascular;  Laterality: N/A;   BUBBLE STUDY  06/19/2019   Procedure: BUBBLE STUDY;  Surgeon: Pixie Casino, MD;  Location: [facility] ENDOSCOPY;  Service: Cardiovascular;;   CARDIAC ELECTROPHYSIOLOGY [place]  01/ 2008    at [place]   right-sided ablation atrial flutter   [place]  03/ 2010   dr Jaymes Graff at [facility]   AV node ablation and pulmonary vein isolation for atrial fib   CARDIOVERSION  06-18-2006;  07-13-2006;  10-19-2010;  10-27-2010   COLONOSCOPY     COLONOSCOPY WITH PROPOFOL N/A 10/13/2017   Procedure: COLONOSCOPY WITH PROPOFOL;  Surgeon: [name], MD;  Location[facility] ENDOSCOPY;  Service: Gastroenterology;  Laterality: N/A;   COLONOSCOPY WITH PROPOFOL N/A  02/20/2020   Procedure: COLONOSCOPY WITH PROPOFOL;  Surgeon: Lesly Rubenstein, MD;  Location: [facility] ENDOSCOPY;  Service: Endoscopy;  Laterality: N/A;   CYSTO/ TRANSURETHRAL COLLAGEN INJECTION THERAPY  07-26-2007   dr Matilde Sprang   DILATION AND CURETTAGE OF UTERUS     ESOPHAGOGASTRODUODENOSCOPY (EGD) WITH PROPOFOL N/A 10/13/2017   Procedure: ESOPHAGOGASTRODUODENOSCOPY (EGD) WITH PROPOFOL;  Surgeon: [name], MD;  Location: [facility] ENDOSCOPY;  Service: Gastroenterology;  Laterality: N/A;   EVALUATION UNDER ANESTHESIA WITH HEMORRHOIDECTOMY N/A 04/09/2020   Procedure: EXAM UNDER ANESTHESIA WITH HEMORRHOIDECTOMY;  Surgeon: Jules Husbands, MD;  Location: [facility] ORS;  Service: General;  Laterality: N/A;   EXCISIONAL HEMORRHOIDECTOMY  1980s   GIVENS CAPSULE STUDY N/A 12/08/2017   Procedure: GIVENS CAPSULE STUDY;  Surgeon: [name], MD;  Location: [facility] ENDOSCOPY;  Service: Gastroenterology;  Laterality: N/A;   HEMORRHOID SURGERY N/A 10/29/2016   Procedure: HEMORRHOIDECTOMY;  Surgeon: Leighton Ruff, MD;  Location: [facility];  Service: General;  Laterality: N/A;   MITRAL VALVE ANNULOPLASTY  10/23/1998   "Model [id number]; Campbell Lerner [id number]"; [facility] 81m[facility]  PACEMAKER IMPLANT N/A 07/28/2019   Procedure: PACEMAKER IMPLANT;  Surgeon: KDeboraha Sprang MD;  [place] [place] [place] [facility];  Service: Cardiovascular;  Laterality: N/A;   [place]  [name]  TEE WITHOUT CARDIOVERSION N/A 06/19/2019   Procedure: TRANSESOPHAGEAL ECHOCARDIOGRAM (TEE);  Surgeon: HPixie Casino MD;  [facility];  Service: Cardiovascular;  Laterality: N/A;   TOTAL HIP ARTHROPLASTY Left 05/04/2017   Procedure: LEFT TOTAL HIP ARTHROPLASTY ANTERIOR APPROACH;  Surgeon: BMcarthur Rossetti MD;  [place]  Service: Orthopedics;  Laterality: Left;   TRANSTHORACIC ECHOCARDIOGRAM  05-01-2015   dr kCaryl Comes  ef 50-55%/   mild AV sclerosis without stenosis/  post MV repair with mild central MR (valve area by pressure half-time 2cm^2,  valve area by continutity equation 0.91cm^2, peak grandiant 835mg)/  severe LAE/ mild TR/ mild RAE    TUBAL LIGATION Bilateral 1978     Current Outpatient Medications  Medication Sig Dispense Refill   acetaminophen (TYLENOL) 500 MG tablet Take 500 mg by mouth every 6 (six) hours as needed for moderate pain.     alendronate (FOSAMAX) 70 MG tablet TAKE 1 TABLET EVERY 7 DAYS. TAKE WITH A FULL GLASS OF WATER ON AN EMPTY STOMACH. 12 tablet 3   Ascorbic Acid (VITAMIN C) 1000 MG tablet Take 1,000 mg by mouth daily.     diclofenac Sodium (  VOLTAREN) 1 % GEL Apply 1 application topically 4 (four) times daily as needed (pain).     diphenhydrAMINE (BENADRYL) 25 mg capsule Take 25 mg by mouth every 6 (six) hours as needed for itching.     diphenhydrAMINE-zinc acetate (BENADRYL) cream Apply 1 application topically 3 (three) times daily as needed for itching.     ezetimibe (ZETIA) 10 MG tablet Take 1 tablet (10 mg total) by mouth daily. 90 tablet 3   ferrous sulfate 325 (65 FE) MG EC tablet TAKE 1 TABLET EVERY DAY WITH BREAKFAST 90 tablet 1   fluticasone (FLONASE) 50 MCG/ACT nasal spray Place 1 spray into both nostrils daily as needed for allergies.     furosemide (LASIX) 40 MG tablet Take 40 mg by mouth daily as needed for edema.     Ketotifen Fumarate (EYE ITCH RELIEF OP) Place 1 drop into both eyes daily as needed (allergies).     levothyroxine (SYNTHROID) 50 MCG tablet Take 1 tablet (50 mcg total) by mouth daily. 90 tablet 3   Polyethyl Glycol-Propyl Glycol 0.4-0.3 % SOLN Place 1-2 drops into both eyes 3 (three) times daily as needed (for dry eyes.).     warfarin (COUMADIN) 5 MG tablet TAKE 1 TABLET DAILY EXCEPT 1/2 TABLET ON MONDAYS AND FRIDAYS OR AS DIRECTED BY COUMADIN CLINIC 90 tablet 0   No current facility-administered medications for this visit.    Allergies:   Amiodarone,  Penicillins, Amiodarone hcl, and Statins  Were evaluated  ROS:  Please see the history of present illness. All other systems are reviewed and negative.    PHYSICAL EXAM: VS:  BP (!) 142/76   Pulse 75   Ht '5\' 4"'$  (1.626 m)   Wt 155 lb 9.6 oz (70.6 kg)   SpO2 97%   BMI 26.71 kg/m  , BMI Body mass index is 26.71 kg/m. Well developed and well nourished in no acute distress HENT normal Neck supple with JVP-flat Clear Device pocket well healed; without hematoma or erythema.  There is no tethering  Regular rate and rhythm, no murmur Abd-soft with active BS No Clubbing cyanosis  edema Skin-warm and dry A & Oriented  Grossly normal sensory and motor function  ECG Atrial fib  and Ventricular pacing QRS upright V1 and lead 1    Wt Readings from Last 3 Encounters:  11/19/20 155 lb 9.6 oz (70.6 kg)  11/05/20 153 lb (69.4 kg)  10/14/20 153 lb 1 oz (69.4 kg)      Other studies Reviewed: Additional studies/ records that were reviewed today include: labs as noted  Review of the above records today demonstrates:    ASSESSMENT AND PLAN: Atrial fibrillation-permanent  AV junction ablation  Pacemaker-[employer]  Hypothyroidism-treated     HFpEF  Anemia   Obesity  Atrial fibrillation permanent  no bleeding continue warfarin   Post stroke remains on ezetimibe; allergic to statins.  Could be a candidate for PCSK9  Euvolemic.  Much improved following AV junction ablation  Not sure what to make of her exposure to the induction still.  No evidence of MI on her device.  Lengthy discussion  Grieving related to the loss of her son  Signed, Virl Axe, MD  11/19/2020 5:52 PM     [place] [address] [phone] (office) [phone] (fax)

## 2020-11-19 ENCOUNTER — Encounter: Payer: Self-pay | Admitting: Internal Medicine

## 2020-11-19 ENCOUNTER — Ambulatory Visit (INDEPENDENT_AMBULATORY_CARE_PROVIDER_SITE_OTHER): Payer: Medicare Other | Admitting: Internal Medicine

## 2020-11-19 ENCOUNTER — Other Ambulatory Visit: Payer: Self-pay

## 2020-11-19 VITALS — BP 142/76 | HR 75 | Ht 64.0 in | Wt 155.6 lb

## 2020-11-19 DIAGNOSIS — Z950 Presence of cardiac pacemaker: Secondary | ICD-10-CM

## 2020-11-19 DIAGNOSIS — I10 Essential (primary) hypertension: Secondary | ICD-10-CM

## 2020-11-19 DIAGNOSIS — I4821 Permanent atrial fibrillation: Secondary | ICD-10-CM

## 2020-11-19 DIAGNOSIS — I4430 Unspecified atrioventricular block: Secondary | ICD-10-CM | POA: Diagnosis not present

## 2020-11-19 NOTE — Patient Instructions (Signed)

## 2020-11-20 ENCOUNTER — Telehealth: Payer: Self-pay | Admitting: *Deleted

## 2020-11-20 ENCOUNTER — Ambulatory Visit (INDEPENDENT_AMBULATORY_CARE_PROVIDER_SITE_OTHER): Payer: Medicare Other | Admitting: Family Medicine

## 2020-11-20 ENCOUNTER — Encounter: Payer: Self-pay | Admitting: Family Medicine

## 2020-11-20 ENCOUNTER — Ambulatory Visit (INDEPENDENT_AMBULATORY_CARE_PROVIDER_SITE_OTHER): Payer: Medicare Other

## 2020-11-20 VITALS — BP 140/68 | HR 81 | Temp 98.0°F | Ht 64.0 in | Wt 152.6 lb

## 2020-11-20 DIAGNOSIS — N368 Other specified disorders of urethra: Secondary | ICD-10-CM

## 2020-11-20 DIAGNOSIS — Z7901 Long term (current) use of anticoagulants: Secondary | ICD-10-CM

## 2020-11-20 DIAGNOSIS — N950 Postmenopausal bleeding: Secondary | ICD-10-CM | POA: Insufficient documentation

## 2020-11-20 LAB — POCT INR: INR: 1.7 — AB (ref 2.0–3.0)

## 2020-11-20 NOTE — Assessment & Plan Note (Signed)
Last night  Painless  No hrt or hormone topical products Takes warfarin- ordered INR   Exam did not yield cause of bleeding  No blood at cervical os No abrasions (there is atrophy) Prolapsed urethral mucosa (baseline and not actively bleeding)  Nl rectal exam with heme neg stool  Disc poss etiologies INR ordered  US pelvic ordered to image endom lining

## 2020-11-20 NOTE — Assessment & Plan Note (Signed)
Vaginal bleeding last night  Takes warfarin  INR ordered Easy bruising is her baseline

## 2020-11-20 NOTE — Patient Instructions (Addendum)
Pre visit review using our clinic review tool, if applicable. No additional management support is needed unless otherwise documented below in the visit note.  Increase dose today to 7.'5mg'$  and then continue '5mg'$  daily except 2.'5mg'$  on Mondays and Fridays. Recheck INR in 4 week.

## 2020-11-20 NOTE — Assessment & Plan Note (Signed)
Noted today and per last urology note (reviewed) No active bleeding or tenderness  Unsure if this could have caused bleeding in the setting of warfarin

## 2020-11-20 NOTE — Patient Instructions (Signed)
Let's check INR   ? If urethra could be source of bleeding   Otherwise normal exam   I ordered a pelvic ultrasound to look at your uterine endometrial lining  You will get a call to set that up   We will make plan when that returns

## 2020-11-20 NOTE — Telephone Encounter (Signed)
She was in ER mid month and cbc was ok but TSH up slightly (not sure if they told her)  If they did not go up on thyroid dose (and no missed doses) we can go up form 50 to 75 and re check in 4-6 wk  Let ne know

## 2020-11-20 NOTE — Progress Notes (Signed)
Subjective:    Patient ID: [name], female    DOB: 1932/08/18, 87 y.o.   MRN: [id number]  This visit occurred during the SARS-CoV-2 public health emergency.  Safety protocols were in place, including screening questions prior to the visit, additional usage of staff PPE, and extensive cleaning of exam room while observing appropriate contact time as indicated for disinfecting solutions.   HPI Pt presents with c/o vaginal bleeding   Wt Readings from Last 3 Encounters:  11/20/20 152 lb 9 oz (69.2 kg)  11/19/20 155 lb 9.6 oz (70.6 kg)  11/05/20 153 lb (69.4 kg)   26.19 kg/m   This started last night  Went to the bathroom and had blood on underwear Not a lot  Used a pad  Did change this am   No vag discharge  No vag pain or itching   No uti symptoms    Has not had a hysterectomy  She is not sexually active currently   No hormone analogs  No hormone creams   No more bruising than usual (she generally does bruise a lot)   Feels ok    No cramping or low abd pain  Bms are not hard and now very regular 2-3 times per day No blood in stool that she knows of  Had hemorrhoid surgery and no problems since    Takes coumadin for a fib  Lab Results  Component Value Date   INR 2.2 10/24/2020   INR 3.2 (A) 09/26/2020   INR 2.7 08/29/2020   Does have a history of hemorrhoids  Also urethral caruncle  Saw urology Dr Matilde Sprang in march and noted urethral mucosal prolapse and grade 1 cystocele with moderate atrophy  Had visit to ER for fatigue/memory issue mid month Lab Results  Component Value Date   WBC 4.5 11/05/2020   HGB 13.2 11/05/2020   HCT 42.6 11/05/2020   MCV 91.4 11/05/2020   PLT 136 (L) 11/05/2020   Lab Results  Component Value Date   TSH 5.131 (H) 11/06/2020    She takes  50 mcg of levothyroxine daily  Patient Active Problem List   Diagnosis Date Noted   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/20/2020   Post-menopausal bleeding 11/20/2020   Facial  paresthesia 10/14/2020   Insect bite 09/20/2020   Grief reaction 09/20/2020   Hearing loss 06/13/2020   Atrial fibrillation ([place]) 04/24/2020   Urethral caruncle 04/23/2020   Pelvic pain 03/11/2020   Routine general medical examination at a health care facility 03/11/2020   Elevated glucose 03/03/2020   Rectal bleeding 02/18/2020   History of TIA (transient ischemic attack) 02/18/2020   S/P AV nodal ablation 07/28/19 07/29/2019   S/P placement of cardiac pacemaker MDT 07/28/19 07/29/2019   AV block 07/28/2019   CVA (cerebral vascular accident) ([place]) 06/16/2019   Facial tingling 11/29/2018   Tremor of left hand 11/29/2018   Medicare annual wellness visit, subsequent 11/24/2018   Dysuria 02/06/2018   Iron deficiency anemia 10/21/2017   Rapid atrial fibrillation ([place]) 08/19/2017   Constipation 08/02/2017   Numbness and tingling 07/14/2017   Paresthesia 07/14/2017   Unilateral primary osteoarthritis, left hip 05/04/2017   Status post total replacement of left hip 05/04/2017   Hip osteoarthritis 04/27/2017   Long term (current) use of anticoagulants 03/04/2017   Venous stasis dermatitis of both lower extremities 01/08/2017   Impacted cerumen of right ear 11/20/2016   Osteopenia 10/25/2016   Pedal edema 08/26/2016   Varicose veins of both lower extremities 08/26/2016  Estrogen deficiency 08/26/2016   Screening mammogram, encounter for 08/26/2016   Hemorrhoids 08/26/2016   History of nonmelanoma skin cancer 01/01/2016   Pruritus 10/04/2015   Urticaria 08/22/2014   Hip pain 08/02/2014   Left knee pain 08/02/2014   Chronic cough 05/08/2014   Hematochezia 04/09/2014   Colon cancer screening 08/16/2013   Encounter for therapeutic drug monitoring 04/20/2013   Left ovarian cyst 03/14/2013   Palpitations 04/15/2012   COLONIC POLYPS, ADENOMATOUS, HX OF 09/18/2009   PULMONARY NODULE 12/20/2008   GANGLION CYST 10/04/2007   Mixed incontinence 04/28/2007   Hyperlipidemia 04/27/2007    Depression with anxiety 04/27/2007   Asthma, mild intermittent 04/27/2007   INSOMNIA 04/27/2007   ADENOMATOUS COLONIC POLYP 11/04/2006   Hypothyroidism 09/02/2006   Atrial fibrillation, chronic ([place]) 08/05/2006   Past Medical History:  Diagnosis Date   Allergic rhinitis    Alopecia 2/2 beta blockers    Anemia    Arthritis    Atrial fibrillation -persistent cardiologist-  dr klein/  primary EP -- dr Tawanna Sat ([place])   a. s/p PVI [name] 2010;  b. on tikosyn/coumadin;  c. 05/2009 Echo: EF 60-65%, Gr 2 DD. (first dx 09/ 2007)   Bilateral lower extremity edema    Bleeding hemorrhoid    Carotid stenosis    mild (hosp 3/11)- consult by vasc/ Dr Donnetta Hutching   Complication of anesthesia    hard to wake   Diverticulosis of colon    Dyspnea    on exertion-climbing stairs   Fatty liver    H/O cardiac radiofrequency ablation    01/ 2008 at [facility] /  03/ 2010  at [facility] failure with preserved ejection fraction [facility])    History of adenomatous polyp of colon    tubular adenoma's   History of cardiomyopathy    secondary tachycardia-induced cardiomyopathy -- resolved 2014   History of squamous cell carcinoma in situ (SCCIS) of skin    05/ 2017  nasal bridge and right medial knee   History of transient ischemic attack (TIA)    01-24-2005 and 06-12-2009   Hyperlipidemia    Hypothyroidism    Mild intermittent asthma    reacts to cats   Mixed stress and urge urinary incontinence    Presence of permanent cardiac pacemaker    was put in 07/2019   Pulmonary nodule    S/P AV nodal ablation 07/28/19 07/29/2019   S/P mitral valve repair 10-23-1998  dr Boyce Medici at [facility]   for MVP and regurg. (annuloplasty ring procedure)   S/P placement of cardiac pacemaker MDT 07/28/19 07/29/2019   Past Surgical History:  Procedure Laterality Date   APPENDECTOMY  1978   AV NODE ABLATION N/A 07/28/2019   Procedure: AV NODE ABLATION;  Surgeon: Deboraha Sprang, MD;  Location: [place] CV  LAB;  Service: Cardiovascular;  Laterality: N/A;   BUBBLE STUDY  06/19/2019   Procedure: BUBBLE STUDY;  Surgeon: Pixie Casino, MD;  Location: [facility] ENDOSCOPY;  Service: Cardiovascular;;   CARDIAC ELECTROPHYSIOLOGY [place]  01/ 2008    at [place]   right-sided ablation atrial flutter   [place]  03/ 2010   dr Jaymes Graff at [facility]   AV node ablation and pulmonary vein isolation for atrial fib   CARDIOVERSION  06-18-2006;  07-13-2006;  10-19-2010;  10-27-2010   COLONOSCOPY     COLONOSCOPY WITH PROPOFOL N/A 10/13/2017   Procedure: COLONOSCOPY WITH PROPOFOL;  Surgeon: [name],  MD;  Location: [facility] ENDOSCOPY;  Service: Gastroenterology;  Laterality: N/A;   COLONOSCOPY WITH PROPOFOL N/A 02/20/2020   Procedure: COLONOSCOPY WITH PROPOFOL;  Surgeon: Lesly Rubenstein, MD;  Location: [facility] ENDOSCOPY;  Service: Endoscopy;  Laterality: N/A;   CYSTO/ TRANSURETHRAL COLLAGEN INJECTION THERAPY  07-26-2007   dr Matilde Sprang   DILATION AND CURETTAGE OF UTERUS     ESOPHAGOGASTRODUODENOSCOPY (EGD) WITH PROPOFOL N/A 10/13/2017   Procedure: ESOPHAGOGASTRODUODENOSCOPY (EGD) WITH PROPOFOL;  Surgeon: [name], MD;  Location: [facility] ENDOSCOPY;  Service: Gastroenterology;  Laterality: N/A;   EVALUATION UNDER ANESTHESIA WITH HEMORRHOIDECTOMY N/A 04/09/2020   Procedure: EXAM UNDER ANESTHESIA WITH HEMORRHOIDECTOMY;  Surgeon: Jules Husbands, MD;  Location: [facility] ORS;  Service: General;  Laterality: N/A;   EXCISIONAL HEMORRHOIDECTOMY  1980s   GIVENS CAPSULE STUDY N/A 12/08/2017   Procedure: GIVENS CAPSULE STUDY;  Surgeon: [name], MD;  Location: [facility] ENDOSCOPY;  Service: Gastroenterology;  Laterality: N/A;   HEMORRHOID SURGERY N/A 10/29/2016   Procedure: HEMORRHOIDECTOMY;  Surgeon: Leighton Ruff, MD;  Location: [facility];  Service: General;  Laterality: N/A;   MITRAL VALVE ANNULOPLASTY  10/23/1998   "Model 4625; Campbell Lerner [id number]"; [facility] 19m[facility] CB[facility] Medici  PACEMAKER IMPLANT N/A 07/28/2019   Procedure: PACEMAKER IMPLANT;  Surgeon: KDeboraha Sprang MD;  [place];  Service: Cardiovascular;  Laterality: N/A;   [place]  [facility]  TEE WITHOUT CARDIOVERSION N/A 06/19/2019   Procedure: TRANSESOPHAGEAL ECHOCARDIOGRAM (TEE);  Surgeon: HPixie Casino MD;  [facility];  Service: Cardiovascular;  Laterality: N/A;   TOTAL HIP ARTHROPLASTY Left 05/04/2017   Procedure: LEFT TOTAL HIP ARTHROPLASTY ANTERIOR APPROACH;  Surgeon: BMcarthur Rossetti MD;  [place]  Service: Orthopedics;  Laterality: Left;   TRANSTHORACIC ECHOCARDIOGRAM  05-01-2015   dr kCaryl Comes  ef 50-55%/  mild AV sclerosis without stenosis/  post MV repair with mild central MR (valve area by pressure half-time 2cm^2,  valve area by continutity equation 0.91cm^2, peak grandiant 857mg)/  severe LAE/ mild TR/ mild RAE    TUBAL LIGATION Bilateral 1978   Social History   Tobacco Use   Smoking status: Never   Smokeless tobacco: Never  Vaping Use   Vaping Use: Never used  Substance Use Topics   Alcohol use: Yes    Alcohol/week: 0.0 standard drinks    Comment: seldom   Drug use: No   Family History  Problem Relation Age of Onset   Lung cancer Father        smoker, died at 8026 Alcohol abuse Father    Cancer Father        bladder and lung CA smoker   Sudden death Other    Breast cancer Neg Hx    Stroke Neg Hx    Allergies  Allergen Reactions   Amiodarone Swelling    SWELLING REACTION UNSPECIFIED    Penicillins Hives and Rash    Has patient had a PCN reaction causing immediate rash, facial/tongue/throat swelling, SOB or lightheadedness with hypotension: No Has patient had a PCN reaction causing severe rash involving mucus membranes or skin necrosis: No Has patient had a PCN reaction that required hospitalization:Patient was inpatient when reaction  occurred Has patient had a PCN reaction occurring within the last 10 years: No If all of the above answers are "NO", then may proceed with Cephalosporin use   Amiodarone Hcl Swelling  SWELLING REACTION UNSPECIFIED    Statins Rash    REACTION: rash   Current Outpatient Medications on File Prior to Visit  Medication Sig Dispense Refill   acetaminophen (TYLENOL) 500 MG tablet Take 500 mg by mouth every 6 (six) hours as needed for moderate pain.     alendronate (FOSAMAX) 70 MG tablet TAKE 1 TABLET EVERY 7 DAYS. TAKE WITH A FULL GLASS OF WATER ON AN EMPTY STOMACH. 12 tablet 3   Ascorbic Acid (VITAMIN C) 1000 MG tablet Take 1,000 mg by mouth daily.     diclofenac Sodium (VOLTAREN) 1 % GEL Apply 1 application topically 4 (four) times daily as needed (pain).     diphenhydrAMINE (BENADRYL) 25 mg capsule Take 25 mg by mouth every 6 (six) hours as needed for itching.     diphenhydrAMINE-zinc acetate (BENADRYL) cream Apply 1 application topically 3 (three) times daily as needed for itching.     ezetimibe (ZETIA) 10 MG tablet Take 1 tablet (10 mg total) by mouth daily. 90 tablet 3   ferrous sulfate 325 (65 FE) MG EC tablet TAKE 1 TABLET EVERY DAY WITH BREAKFAST 90 tablet 1   fluticasone (FLONASE) 50 MCG/ACT nasal spray Place 1 spray into both nostrils daily as needed for allergies.     furosemide (LASIX) 40 MG tablet Take 40 mg by mouth daily as needed for edema.     Ketotifen Fumarate (EYE ITCH RELIEF OP) Place 1 drop into both eyes daily as needed (allergies).     levothyroxine (SYNTHROID) 50 MCG tablet Take 1 tablet (50 mcg total) by mouth daily. 90 tablet 3   Polyethyl Glycol-Propyl Glycol 0.4-0.3 % SOLN Place 1-2 drops into both eyes 3 (three) times daily as needed (for dry eyes.).     warfarin (COUMADIN) 5 MG tablet TAKE 1 TABLET DAILY EXCEPT 1/2 TABLET ON MONDAYS AND FRIDAYS OR AS DIRECTED BY COUMADIN CLINIC 90 tablet 0   No current facility-administered medications on file prior to visit.      Review of Systems  Constitutional:  Positive for fatigue. Negative for activity change, appetite change, fever and unexpected weight change.  HENT:  Negative for congestion, ear pain, rhinorrhea, sinus pressure and sore throat.   Eyes:  Negative for pain, redness and visual disturbance.  Respiratory:  Negative for cough, shortness of breath and wheezing.   Cardiovascular:  Negative for chest pain and palpitations.  Gastrointestinal:  Negative for abdominal pain, blood in stool, constipation and diarrhea.  Endocrine: Negative for polydipsia and polyuria.  Genitourinary:  Positive for vaginal bleeding. Negative for difficulty urinating, dysuria, frequency, genital sores, hematuria, urgency, vaginal discharge and vaginal pain.  Musculoskeletal:  Negative for arthralgias, back pain and myalgias.  Skin:  Negative for pallor and rash.  Allergic/Immunologic: Negative for environmental allergies.  Neurological:  Negative for dizziness, syncope and headaches.  Hematological:  Negative for adenopathy. Does not bruise/bleed easily.  Psychiatric/Behavioral:  Negative for decreased concentration and dysphoric mood. The patient is not nervous/anxious.       Objective:   Physical Exam Exam conducted with a chaperone present.  Constitutional:      General: She is not in acute distress.    Appearance: Normal appearance. She is normal weight. She is not ill-appearing or diaphoretic.  Eyes:     General: No scleral icterus.    Conjunctiva/sclera: Conjunctivae normal.     Pupils: Pupils are equal, round, and reactive to light.  Cardiovascular:     Rate and Rhythm: Normal rate.  Pulses: Normal pulses.     Heart sounds: Normal heart sounds.  Pulmonary:     Effort: Pulmonary effort is normal. No respiratory distress.     Breath sounds: Normal breath sounds. No wheezing or rales.  Abdominal:     General: Abdomen is flat. Bowel sounds are normal. There is no distension.     Palpations: Abdomen is  soft. There is no mass.     Tenderness: There is no abdominal tenderness.     Comments: No suprapubic tenderness or fullness    Genitourinary:    Exam position: Lithotomy position.     Pubic Area: No rash.      Labia:        Right: No rash, tenderness, lesion or injury.        Left: No rash or tenderness.      Urethra: Prolapse present. No urethral pain or urethral lesion.     Vagina: No signs of injury and foreign body. No vaginal discharge, erythema, tenderness, bleeding, lesions or prolapsed vaginal walls.     Cervix: No discharge, lesion or cervical bleeding.     Uterus: Normal.      Adnexa:        Right: No mass, tenderness or fullness.         Left: No mass, tenderness or fullness.       Rectum: Normal. Guaiac result negative.     Comments: No blood at cervical os   Atrophic vaginal mucosa No breakdown or bleeding  Musculoskeletal:     Cervical back: Normal range of motion. No tenderness.     Right lower leg: No edema.     Left lower leg: No edema.  Lymphadenopathy:     Cervical: No cervical adenopathy.     Lower Body: No right inguinal adenopathy. No left inguinal adenopathy.  Neurological:     Mental Status: She is alert.          Assessment & Plan:   Problem List Items Addressed This Visit       Genitourinary   Prolapse urethral mucosa    Noted today and per last urology note (reviewed) No active bleeding or tenderness  Unsure if this could have caused bleeding in the setting of warfarin         Other   Long term (current) use of anticoagulants    Vaginal bleeding last night  Takes warfarin  INR ordered Easy bruising is her baseline      Post-menopausal bleeding - Primary    Last night  Painless  No hrt or hormone topical products Takes warfarin- ordered INR   Exam did not yield cause of bleeding  No blood at cervical os No abrasions (there is atrophy) Prolapsed urethral mucosa (baseline and not actively bleeding)  Nl rectal exam with heme  neg stool  Disc poss etiologies INR ordered  US pelvic ordered to image endom lining      Relevant Orders   US PELVIC COMPLETE WITH TRANSVAGINAL

## 2020-11-20 NOTE — Telephone Encounter (Signed)
After appt pt had another question for PCP. Pt said she is also fatigued and wanted her labs rechecked. Pt asked if we could check her CBC and TSH and any other labs PCP thought of, I did ask pt to stay and see what PCP thought incase she could order labs but pt couldn't stay and said it was okay for [name] to call her back after PCP reviewed message

## 2020-11-21 ENCOUNTER — Ambulatory Visit
Admission: RE | Admit: 2020-11-21 | Discharge: 2020-11-21 | Disposition: A | Discharge status: Home / Self Care | Payer: Medicare Other | Source: Ambulatory Visit | Attending: Family Medicine | Admitting: Family Medicine

## 2020-11-21 ENCOUNTER — Ambulatory Visit: Payer: Medicare Other

## 2020-11-21 ENCOUNTER — Other Ambulatory Visit: Payer: Self-pay

## 2020-11-21 DIAGNOSIS — D251 Intramural leiomyoma of uterus: Secondary | ICD-10-CM | POA: Diagnosis not present

## 2020-11-21 DIAGNOSIS — N950 Postmenopausal bleeding: Secondary | ICD-10-CM | POA: Diagnosis not present

## 2020-11-21 DIAGNOSIS — N858 Other specified noninflammatory disorders of uterus: Secondary | ICD-10-CM | POA: Diagnosis not present

## 2020-11-21 DIAGNOSIS — N83202 Unspecified ovarian cyst, left side: Secondary | ICD-10-CM | POA: Diagnosis not present

## 2020-11-22 MED ORDER — LEVOTHYROXINE SODIUM 75 MCG PO TABS: 75.0000 ug | ORAL_TABLET | Freq: Every day | ORAL | 0 refills | Status: DC

## 2020-11-22 NOTE — Telephone Encounter (Signed)
Pt notified of Dr. Marliss Coots comments. She hasn't missed any doses of med and they didn't adjust meds in hospital. Rx sent to pharmacy and f/u lab appt scheduled

## 2020-11-27 ENCOUNTER — Telehealth: Payer: Self-pay | Admitting: Family Medicine

## 2020-11-27 DIAGNOSIS — N950 Postmenopausal bleeding: Secondary | ICD-10-CM

## 2020-11-27 DIAGNOSIS — N83202 Unspecified ovarian cyst, left side: Secondary | ICD-10-CM

## 2020-11-27 NOTE — Telephone Encounter (Signed)
-----   Message from Abner Greenspan, MD sent at 11/25/2020 10:22 AM EDT ----- Not sure if the fibroid could have causes some bleeding briefly. I would like her to have a gyn visit to discuss that and the ovarian cyst  (it is very reassuring that she has no pain and no more bleeding and that endometrial layer is not too large, but still would like gyn opinion) Does she pref [place] or [place]?

## 2020-11-27 NOTE — Telephone Encounter (Signed)
Referral done

## 2020-11-27 NOTE — Telephone Encounter (Signed)
See prev message. Pt agrees with referral and would like to see someone in [place], if possible. I advise pt PCP will put referral in and she will get a phone call.

## 2020-11-27 NOTE — Telephone Encounter (Signed)
Pt called returning your call about [name] results

## 2020-12-15 ENCOUNTER — Other Ambulatory Visit: Payer: Self-pay | Admitting: Family Medicine

## 2020-12-17 ENCOUNTER — Ambulatory Visit (INDEPENDENT_AMBULATORY_CARE_PROVIDER_SITE_OTHER): Payer: Medicare Other | Admitting: Obstetrics and Gynecology

## 2020-12-17 ENCOUNTER — Encounter: Payer: Self-pay | Admitting: Obstetrics and Gynecology

## 2020-12-17 ENCOUNTER — Other Ambulatory Visit: Payer: Self-pay

## 2020-12-17 VITALS — BP 162/80 | Wt 155.0 lb

## 2020-12-17 DIAGNOSIS — N83202 Unspecified ovarian cyst, left side: Secondary | ICD-10-CM

## 2020-12-17 DIAGNOSIS — N950 Postmenopausal bleeding: Secondary | ICD-10-CM | POA: Diagnosis not present

## 2020-12-17 NOTE — Progress Notes (Signed)
Obstetrics & Gynecology Office Visit   Chief Complaint: No chief complaint on file.   History of Present Illness: The patient is a 87 y.o. female presenting for consultation at the request of Dr. Kara Dies  concerning a incidental left adnexal cyst.  Initial presentation was prompted by  postmenopausal bleeding .  Previous transvaginal ultrasound imaging demonstrated dimensions of 2.3cm x 1.6cm x 1.9cm.  Appearance was notable simple cyst, pelvic free fluid, acites, and absence of ascites. The patient endorses no associated symptoms.  The patient denies associated symptoms of  abdominal pain, pelvic pain, early satiety, weight gain, weight loss, night sweats, vaginal bleeding, pelvic pressure, constipation, diarrhea, nausea, and emesis.  There is not a notable family history of ovarian cancer, uterine cancer, breast cancer, or colon cancer.  Review of Systems: 10 point review of systems negative unless otherwise noted in HPI  Past Medical History:  Patient Active Problem List   Diagnosis Date Noted   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/20/2020   Post-menopausal bleeding 11/20/2020   Facial paresthesia 10/14/2020   Insect bite 09/20/2020   Grief reaction 09/20/2020   Hearing loss 06/13/2020   Atrial fibrillation ([place]) 04/24/2020   Pelvic pain 03/11/2020   Routine general medical examination at a health care facility 03/11/2020   Elevated glucose 03/03/2020   Rectal bleeding 02/18/2020   History of TIA (transient ischemic attack) 02/18/2020   S/P AV nodal ablation 07/28/19 07/29/2019   S/P placement of cardiac pacemaker MDT 07/28/19 07/29/2019   AV block 07/28/2019   CVA (cerebral vascular accident) ([place]) 06/16/2019   Facial tingling 11/29/2018    Left side    Tremor of left hand 11/29/2018   Medicare annual wellness visit, subsequent 11/24/2018   Dysuria 02/06/2018   Iron deficiency anemia 10/21/2017   Rapid atrial fibrillation ([place]) 08/19/2017   Constipation 08/02/2017   Numbness and  tingling 07/14/2017   Paresthesia 07/14/2017   Unilateral primary osteoarthritis, left hip 05/04/2017   Status post total replacement of left hip 05/04/2017   Hip osteoarthritis 04/27/2017   Long term (current) use of anticoagulants 03/04/2017   Venous stasis dermatitis of both lower extremities 01/08/2017   Impacted cerumen of right ear 11/20/2016   Osteopenia 10/25/2016    dexa 7/18 Alendronate started 8/18    Pedal edema 08/26/2016   Varicose veins of both lower extremities 08/26/2016   Estrogen deficiency 08/26/2016   Screening mammogram, encounter for 08/26/2016   Hemorrhoids 08/26/2016    Bleeding     History of nonmelanoma skin cancer 01/01/2016    Formatting of this note might be different from the original. Skin Cancer History- Non-Melanoma Skin Cancer  Diagnosis Location Biopsy Date Treatment date Procedure Surgeon  [facility] Nasal bridge May 2017 May 2017 Effudex  SCCis Right medial knee May 2017 May 2017 Effudex    Pruritus 10/04/2015   Urticaria 08/22/2014   Hip pain 08/02/2014   Left knee pain 08/02/2014   Chronic cough 05/08/2014    CXR 03/2014:  Normal except for mild increase markings left base that are chronic CT A/P 2014:  No scarring or bronchiectasis noted on the lower lung slices.  Arlyce Harman 04/2014:  Mild airflow obstruction    Hematochezia 04/09/2014   Colon cancer screening 08/16/2013   Encounter for therapeutic drug monitoring 04/20/2013   Left ovarian cyst 03/14/2013    Asymptomatic seen on CT    Palpitations 04/15/2012   COLONIC POLYPS, ADENOMATOUS, HX OF 09/18/2009    Qualifier: Diagnosis of  By: Nils Pyle CMA (AAMA), Mearl Latin  PULMONARY NODULE 12/20/2008    Qualifier: Diagnosis of  By: Caryl Comes, MD, [facility]     GANGLION CYST 10/04/2007    Qualifier: Diagnosis of  By: Glori Bickers MD, Carmell Austria     Mixed incontinence 04/28/2007    Qualifier: Diagnosis of  By: Glori Bickers MD, Carmell Austria     Hyperlipidemia 04/27/2007    Qualifier: Diagnosis  of  By: Glori Bickers MD, Carmell Austria     Depression with anxiety 04/27/2007    Qualifier: Diagnosis of  By: Glori Bickers MD, Carmell Austria     Asthma, mild intermittent 04/27/2007    Spiro 04/2014:  FEV1 1.42 (68%), ratio 68     INSOMNIA 04/27/2007    Qualifier: Diagnosis of  By: Glori Bickers MD, Carmell Austria     ADENOMATOUS COLONIC POLYP 11/04/2006    Qualifier: Diagnosis of  By: Julaine Hua CMA (AAMA), Amanda      Hypothyroidism 09/02/2006    Qualifier: Diagnosis of  By: Selinda Orion      Atrial fibrillation, chronic ([place]) 08/05/2006    Qualifier: Diagnosis of  By: Selinda Orion       Past Surgical History:  Past Surgical History:  Procedure Laterality Date   APPENDECTOMY  1978   AV NODE ABLATION N/A 07/28/2019   Procedure: AV NODE ABLATION;  Surgeon: Deboraha Sprang, MD;  Location: [place] CV LAB;  Service: Cardiovascular;  Laterality: N/A;   BUBBLE STUDY  06/19/2019   Procedure: BUBBLE STUDY;  Surgeon: Pixie Casino, MD;  Location: [facility] ENDOSCOPY;  Service: Cardiovascular;;   CARDIAC ELECTROPHYSIOLOGY [place]  01/ 2008    at [place]   right-sided ablation atrial flutter   [place]  03/ 2010   dr Jaymes Graff at [facility]   AV node ablation and pulmonary vein isolation for atrial fib   CARDIOVERSION  06-18-2006;  07-13-2006;  10-19-2010;  10-27-2010   COLONOSCOPY     COLONOSCOPY WITH PROPOFOL N/A 10/13/2017   Procedure: COLONOSCOPY WITH PROPOFOL;  Surgeon: [name], MD;  Location: [facility] ENDOSCOPY;  Service: Gastroenterology;  Laterality: N/A;   COLONOSCOPY WITH PROPOFOL N/A 02/20/2020   Procedure: COLONOSCOPY WITH PROPOFOL;  Surgeon: Lesly Rubenstein, MD;  Location: [facility] ENDOSCOPY;  Service: Endoscopy;  Laterality: N/A;   CYSTO/ TRANSURETHRAL COLLAGEN INJECTION THERAPY  07-26-2007   dr Matilde Sprang   DILATION AND CURETTAGE OF UTERUS     ESOPHAGOGASTRODUODENOSCOPY (EGD) WITH PROPOFOL N/A 10/13/2017   Procedure: ESOPHAGOGASTRODUODENOSCOPY  (EGD) WITH PROPOFOL;  Surgeon: [name], MD;  Location: [facility] ENDOSCOPY;  Service: Gastroenterology;  Laterality: N/A;   EVALUATION UNDER ANESTHESIA WITH HEMORRHOIDECTOMY N/A 04/09/2020   Procedure: EXAM UNDER ANESTHESIA WITH HEMORRHOIDECTOMY;  Surgeon: Jules Husbands, MD;  Location: [facility] ORS;  Service: General;  Laterality: N/A;   EXCISIONAL HEMORRHOIDECTOMY  1980s   GIVENS CAPSULE STUDY N/A 12/08/2017   Procedure: GIVENS CAPSULE STUDY;  Surgeon: [name], MD;  Location: [facility] ENDOSCOPY;  Service: Gastroenterology;  Laterality: N/A;   HEMORRHOID SURGERY N/A 10/29/2016   Procedure: HEMORRHOIDECTOMY;  Surgeon: Leighton Ruff, MD;  Location: [facility];  Service: General;  Laterality: N/A;   MITRAL VALVE ANNULOPLASTY  10/23/1998   "Model 4625; Campbell Lerner [id number]"; size 73mm; [facility]; Dr. Boyce Medici   PACEMAKER IMPLANT N/A 07/28/2019   Procedure: PACEMAKER IMPLANT;  Surgeon: Deboraha Sprang, MD;  Location: [place] CV LAB;  Service: Cardiovascular;  Laterality: N/A;   [place] Di­az   TEE WITH CARDIOVERSION  05-06-2006 at [facility];  01-02-2013 at [facility]   TEE WITHOUT CARDIOVERSION N/A 06/19/2019   Procedure: TRANSESOPHAGEAL ECHOCARDIOGRAM (TEE);  Surgeon: Pixie Casino, MD;  Location: [facility] ENDOSCOPY;  Service: Cardiovascular;  Laterality: N/A;   TOTAL HIP ARTHROPLASTY Left 05/04/2017   Procedure: LEFT TOTAL HIP ARTHROPLASTY ANTERIOR APPROACH;  Surgeon: Mcarthur Rossetti, MD;  Location: [place];  Service: Orthopedics;  Laterality: Left;   TRANSTHORACIC ECHOCARDIOGRAM  05-01-2015   dr Caryl Comes   ef 50-55%/  mild AV sclerosis without stenosis/  post MV repair with mild central MR (valve area by pressure half-time 2cm^2,  valve area by continutity equation 0.91cm^2, peak grandiant 38mmHg)/  severe LAE/ mild TR/ mild RAE    TUBAL LIGATION Bilateral 1978    Gynecologic History: No LMP recorded. Patient is postmenopausal.  Obstetric History: No obstetric history on  file.  Family History:  Family History  Problem Relation Age of Onset   Lung cancer Father        smoker, died at 54   Alcohol abuse Father    Cancer Father        bladder and lung CA smoker   Sudden death Other    Breast cancer Neg Hx    Stroke Neg Hx     Social History:  Social History   Socioeconomic History   Marital status: Widowed    Spouse name: Not on file   Number of children: 6   Years of education: Not on file   Highest education level: Not on file  Occupational History   Occupation: realtor    Employer: RETIRED  Tobacco Use   Smoking status: Never   Smokeless tobacco: Never  Vaping Use   Vaping Use: Never used  Substance and Sexual Activity   Alcohol use: Yes    Alcohol/week: 0.0 standard drinks    Comment: seldom   Drug use: No   Sexual activity: Not Currently  Other Topics Concern   Not on file  Social History Narrative   Retired. Daily Caffeine use: 2 daily    Social Determinants of Health   Financial Resource Strain: Low Risk    Difficulty of Paying Living Expenses: Not hard at all  Food Insecurity: No Food Insecurity   Worried About Charity fundraiser in the Last Year: Never true   Ran Out of Food in the Last Year: Never true  Transportation Needs: No Transportation Needs   Lack of Transportation (Medical): No   Lack of Transportation (Non-Medical): No  Physical Activity: Sufficiently Active   Days of Exercise per Week: 3 days   Minutes of Exercise per Session: 60 min  Stress: No Stress Concern Present   Feeling of Stress : Not at all  Social Connections: Not on file  Intimate Partner Violence: Not At Risk   Fear of Current or Ex-Partner: No   Emotionally Abused: No   Physically Abused: No   Sexually Abused: No    Allergies:  Allergies  Allergen Reactions   Amiodarone Swelling    SWELLING REACTION UNSPECIFIED    Penicillins Hives and Rash    Has patient had a PCN reaction causing immediate rash, facial/tongue/throat swelling,  SOB or lightheadedness with hypotension: No Has patient had a PCN reaction causing severe rash involving mucus membranes or skin necrosis: No Has patient had a PCN reaction that required hospitalization:Patient was inpatient when reaction occurred Has patient had a PCN reaction occurring within the last 10 years: No If all of the above answers are "NO", then may proceed with  Cephalosporin use   Amiodarone Hcl Swelling    SWELLING REACTION UNSPECIFIED    Statins Rash    REACTION: rash    Medications: Prior to Admission medications   Medication Sig Start Date End Date Taking? Authorizing Provider  acetaminophen (TYLENOL) 500 MG tablet Take 500 mg by mouth every 6 (six) hours as needed for moderate pain.    [provider]  alendronate (FOSAMAX) 70 MG tablet TAKE 1 TABLET EVERY 7 DAYS. TAKE WITH A FULL GLASS OF WATER ON AN EMPTY STOMACH. 07/30/20   Tower, Wynelle Fanny, MD  Ascorbic Acid (VITAMIN C) 1000 MG tablet Take 1,000 mg by mouth daily.    [provider]  diclofenac Sodium (VOLTAREN) 1 % GEL Apply 1 application topically 4 (four) times daily as needed (pain).    [provider]  diphenhydrAMINE (BENADRYL) 25 mg capsule Take 25 mg by mouth every 6 (six) hours as needed for itching.    [provider]  diphenhydrAMINE-zinc acetate (BENADRYL) cream Apply 1 application topically 3 (three) times daily as needed for itching.    [provider]  ezetimibe (ZETIA) 10 MG tablet Take 1 tablet (10 mg total) by mouth daily. 09/13/19   Deboraha Sprang, MD  ferrous sulfate 325 (65 FE) MG EC tablet TAKE 1 TABLET EVERY DAY WITH BREAKFAST 09/19/20   Tower, Wynelle Fanny, MD  fluticasone (FLONASE) 50 MCG/ACT nasal spray Place 1 spray into both nostrils daily as needed for allergies.    [provider]  furosemide (LASIX) 40 MG tablet Take 40 mg by mouth daily as needed for edema.    [provider]  Ketotifen Fumarate (EYE ITCH RELIEF OP) Place 1 drop into  both eyes daily as needed (allergies).    [provider]  levothyroxine (SYNTHROID) 75 MCG tablet Take 1 tablet (75 mcg total) by mouth daily before breakfast. 11/22/20   Tower, Wynelle Fanny, MD  Polyethyl Glycol-Propyl Glycol 0.4-0.3 % SOLN Place 1-2 drops into both eyes 3 (three) times daily as needed (for dry eyes.).    [provider]  warfarin (COUMADIN) 5 MG tablet TAKE 1 TABLET DAILY EXCEPT 1/2 TABLET ON MONDAYS AND FRIDAYS OR AS DIRECTED BY COUMADIN CLINIC 07/30/20   Tower, Wynelle Fanny, MD    Physical Exam Vitals: There were no vitals filed for this visit. No LMP recorded. Patient is postmenopausal.  General: NAD HEENT: normocephalic, anicteric Pulmonary: No increased work of breathing Neurologic: Grossly intact Psychiatric: mood appropriate, affect full  Female chaperone present for pelvic and breast  portions of the physical exam  US PELVIC COMPLETE WITH TRANSVAGINAL  Result Date: 11/21/2020 CLINICAL DATA:  Postmenopausal vaginal bleeding.  On warfarin. EXAM: TRANSABDOMINAL AND TRANSVAGINAL ULTRASOUND OF PELVIS TECHNIQUE: Both transabdominal and transvaginal ultrasound examinations of the pelvis were performed. Transabdominal technique was performed for global imaging of the pelvis including uterus, ovaries, adnexal regions, and pelvic cul-de-sac. It was necessary to proceed with endovaginal exam following the transabdominal exam to visualize the uterus, endometrium, bilateral ovaries. COMPARISON:  CT abdomen pelvis 02/18/2020, ultrasound pelvis 03/17/2013 FINDINGS: Uterus Measurements: 6.2 x 3.5 x 4.5 cm = volume: 51 mL. There is a calcified 2 x 1.5 x 1.2 cm left posterior uterine wall lesion likely representing a degenerative uterine fibroid. No other mass visualized. Endometrium Thickness: 3 mm.  No focal abnormality visualized. Right ovary Not visualized. Left ovary Measurements: 3.2 x 2 x 2.6 cm = volume: 9 mL. There is a 2.3 x 1.6 x 1.9 cm simple cystic  lesion within the  left ovary. Other findings Small volume simple free fluid within the pelvis. IMPRESSION: 1. A 2 cm degenerative intramural uterine fibroid. 2. Small volume simple free fluid within the pelvis. 3. Right ovary not visualized. Electronically Signed   By: Iven Finn M.D.   On: 11/21/2020 16:42    Assessment: 87 y.o. No obstetric history on file. presenting for left ovarian cyst  Plan: Problem List Items Addressed This Visit       Endocrine   Left ovarian cyst - Primary   Relevant Orders   US PELVIC COMPLETE WITH TRANSVAGINAL   Other Visit Diagnoses     Postmenopausal bleeding           1) POSTMENOPAUSAL The incidence and implication of adnexal masses and ovarian cysts were discussed with the patient in detail.  Prior imaging if available was reviewed at today's visit.  While adnexal masses and cysts are a less common imaging finding in postmenopausal women as compared to premenopausal women, the vast majority of these lesions are still benign.  Follow up imaging to determine stability in size and appearance is reasonable in order to provide additional reassurance.  In some cases symptoms, family history, or indeterminate or concerning findings may warrant surgical evaluation and referral to a Gynecology-Oncologist., or serum tumor markers.    - 1. Cysts ?1 cm: Are clinically inconsequential; at the discretion of the interpreting physician whether or not to describe them in the imaging report; do not need follow-up. 2. Cysts >1 and ?7 cm: Should be described in the imaging report with statement that they are almost certainly benign; yearly follow-up, at least initially, with [name] recommended. Some practices may opt to increase the lower size threshold for follow-up from 1 cm to as high as 3 cm. One may opt to continue follow-up annually or to decrease the frequency of follow-up once stability or decrease in size has been confirmed. Cysts in the larger end of this range should still generally be  followed on a regular basis. 3. Cysts >7 cm: Since these may be difficult to assess completely with [name], further imaging with MR or surgical evaluation should be considered.  Gordy Levan et al. Management of Asymptomatic Ovarian and Other Adnexal Cysts Imaged at [name]: Society of Radiologists in [place] Statement 2010. Radiology 256 (Sept 2010): 943-954.).  - given benign appareance recommend follow up ultrasound in 3-6 months  2) Tumor makers were not ordered  3) IOTA LR2 score showing a LR of   4) Return in about 3 months (around 03/18/2021) for follow up 3-4 months after [facility] imaging.   Malachy Mood, MD, [name], [facility] 12/17/2020, 1:39 PM

## 2020-12-18 ENCOUNTER — Telehealth: Payer: Self-pay | Admitting: Family Medicine

## 2020-12-18 DIAGNOSIS — E7849 Other hyperlipidemia: Secondary | ICD-10-CM

## 2020-12-18 DIAGNOSIS — D500 Iron deficiency anemia secondary to blood loss (chronic): Secondary | ICD-10-CM

## 2020-12-18 DIAGNOSIS — E039 Hypothyroidism, unspecified: Secondary | ICD-10-CM

## 2020-12-18 NOTE — Telephone Encounter (Signed)
Will refill after labs on Friday if TSH is in normal range

## 2020-12-18 NOTE — Telephone Encounter (Signed)
-----   Message from Ellamae Sia sent at 12/04/2020 11:35 AM EDT ----- Regarding: Lab orders for Friday, 9.30.22 Lab orders, no f/u appt

## 2020-12-18 NOTE — Telephone Encounter (Signed)
She may be due for protime visit

## 2020-12-20 ENCOUNTER — Other Ambulatory Visit: Payer: Medicare Other

## 2020-12-20 ENCOUNTER — Other Ambulatory Visit: Payer: Self-pay

## 2020-12-20 ENCOUNTER — Ambulatory Visit (INDEPENDENT_AMBULATORY_CARE_PROVIDER_SITE_OTHER): Payer: Medicare Other

## 2020-12-20 ENCOUNTER — Other Ambulatory Visit (INDEPENDENT_AMBULATORY_CARE_PROVIDER_SITE_OTHER): Payer: Medicare Other

## 2020-12-20 DIAGNOSIS — Z7901 Long term (current) use of anticoagulants: Secondary | ICD-10-CM | POA: Diagnosis not present

## 2020-12-20 DIAGNOSIS — E039 Hypothyroidism, unspecified: Secondary | ICD-10-CM

## 2020-12-20 DIAGNOSIS — E7849 Other hyperlipidemia: Secondary | ICD-10-CM | POA: Diagnosis not present

## 2020-12-20 DIAGNOSIS — D500 Iron deficiency anemia secondary to blood loss (chronic): Secondary | ICD-10-CM

## 2020-12-20 LAB — CBC WITH DIFFERENTIAL/PLATELET
Basophils Absolute: 0 10*3/uL (ref 0.0–0.1)
Basophils Relative: 1.1 % (ref 0.0–3.0)
Eosinophils Absolute: 0.2 10*3/uL (ref 0.0–0.7)
Eosinophils Relative: 4.3 % (ref 0.0–5.0)
HCT: 40.6 % (ref 36.0–46.0)
Hemoglobin: 13 g/dL (ref 12.0–15.0)
Lymphocytes Relative: 27.9 % (ref 12.0–46.0)
Lymphs Abs: 1.1 10*3/uL (ref 0.7–4.0)
MCHC: 32.1 g/dL (ref 30.0–36.0)
MCV: 88 fl (ref 78.0–100.0)
Monocytes Absolute: 0.3 10*3/uL (ref 0.1–1.0)
Monocytes Relative: 7.5 % (ref 3.0–12.0)
Neutro Abs: 2.4 10*3/uL (ref 1.4–7.7)
Neutrophils Relative %: 59.2 % (ref 43.0–77.0)
Platelets: 131 10*3/uL — ABNORMAL LOW (ref 150.0–400.0)
RBC: 4.62 Mil/uL (ref 3.87–5.11)
RDW: 15.2 % (ref 11.5–15.5)
WBC: 4 10*3/uL (ref 4.0–10.5)

## 2020-12-20 LAB — IRON: Iron: 44 ug/dL (ref 42–145)

## 2020-12-20 LAB — POCT INR: INR: 1.6 — AB (ref 2.0–3.0)

## 2020-12-20 LAB — LIPID PANEL
Cholesterol: 165 mg/dL (ref 0–200)
HDL: 46.3 mg/dL (ref >39.00)
LDL Cholesterol: 105 mg/dL — ABNORMAL HIGH (ref 0–99)
NonHDL: 118.87
Total CHOL/HDL Ratio: 4
Triglycerides: 71 mg/dL (ref 0.0–149.0)
VLDL: 14.2 mg/dL (ref 0.0–40.0)

## 2020-12-20 LAB — TSH: TSH: 3.06 u[IU]/mL (ref 0.35–5.50)

## 2020-12-20 LAB — FERRITIN: Ferritin: 50.6 ng/mL (ref 10.0–291.0)

## 2020-12-20 NOTE — Telephone Encounter (Signed)
TSH was in normal range so will refill med

## 2020-12-20 NOTE — Patient Instructions (Addendum)
Pre visit review using our clinic review tool, if applicable. No additional management support is needed unless otherwise documented below in the visit note.  Increase dose today to 1 tablet and increase dose tomorrow to 1 1/2 tablets and then change weekly dose to take 1 tablet daily. Recheck in 2 wks.

## 2020-12-24 ENCOUNTER — Ambulatory Visit (INDEPENDENT_AMBULATORY_CARE_PROVIDER_SITE_OTHER): Payer: Medicare Other | Admitting: Neurology

## 2020-12-24 ENCOUNTER — Encounter: Payer: Self-pay | Admitting: Neurology

## 2020-12-24 VITALS — BP 118/82 | HR 72 | Ht 64.0 in | Wt 154.0 lb

## 2020-12-24 DIAGNOSIS — R404 Transient alteration of awareness: Secondary | ICD-10-CM | POA: Diagnosis not present

## 2020-12-24 NOTE — Progress Notes (Signed)
[facility]  PATIENT: [name] DOB: 08/04/1932  REFERRING CLINICIAN: Tower, Wynelle Fanny, MD HISTORY FROM: Patient REASON FOR VISIT: Stange sensation when she is closed to her Induction range (magnetic range)    HISTORICAL  CHIEF COMPLAINT:  Chief Complaint  Patient presents with   New Patient (Initial Visit)    RM 12, alone. C/o episode of a strange sensation when she is close to Building services engineer. She bought an induction range and she has this feeling when she is close to this equipment. Denies history of seizures. C/o difficulty recalling names.    HISTORY OF PRESENT ILLNESS:  This is a 87 year old woman with past medical history of atrial fibrillation on anticoagulation, pacemaker placement, mitral valve replacement, hypertension, hyperlipidemia, right MCA stroke who is presenting with abnormal sensation when she is near her oven range, magnetic oven range.  Patient stated she had a new magnetic oven range for the past year which has been working perfectly prior and after her pacemaker placement.  She said at one point, the oven malfunctioned and on that date she felt like something was pulling her down.  After that event she reported everything felt weird, she could not focus, whenever she is near the induction range therefore she decided to move it out of the house to another location.  She reported being near the oven will bring the same symptoms of brain fog, generalized weakness and difficulty concentrating.  At that time she decided to remove the oven outside of her house and in the pump house in her property.  She reported couple weeks ago she went in the pump house to clean her pool and have the same sensation with trouble with focusing, weakness and brain fog.  Because of that she went to the ED, repeat head CT was normal and was referred to neurology for additional evaluation.  She is very active person but reports some sadness due to loss of her son 4  months ago.  She reported that daughter-in-law passed away 18 months ago then her son was very affected by the loss of his wife.  She decided to move in his son but believes that he was very sad and depressed about the loss. She found him dead in his room 4 months ago. She currently lives alone.     OTHER MEDICAL CONDITIONS: Atrial fibrillation, pacemaker, mitral valve replacement, HTN, HLD   REVIEW OF SYSTEMS: Full 14 system review of systems performed and negative with exception of: as noted in the HPI  ALLERGIES: Allergies  Allergen Reactions   Amiodarone Swelling    SWELLING REACTION UNSPECIFIED    Penicillins Hives and Rash    Has patient had a PCN reaction causing immediate rash, facial/tongue/throat swelling, SOB or lightheadedness with hypotension: No Has patient had a PCN reaction causing severe rash involving mucus membranes or skin necrosis: No Has patient had a PCN reaction that required hospitalization:Patient was inpatient when reaction occurred Has patient had a PCN reaction occurring within the last 10 years: No If all of the above answers are "NO", then may proceed with Cephalosporin use   Amiodarone Hcl Swelling    SWELLING REACTION UNSPECIFIED    Statins Rash    REACTION: rash    HOME MEDICATIONS: Outpatient Medications Prior to Visit  Medication Sig Dispense Refill   acetaminophen (TYLENOL) 500 MG tablet Take 500 mg by mouth every 6 (six) hours as needed for moderate pain.     alendronate (FOSAMAX) 70 MG tablet TAKE  1 TABLET EVERY 7 DAYS. TAKE WITH A FULL GLASS OF WATER ON AN EMPTY STOMACH. 12 tablet 3   Ascorbic Acid (VITAMIN C) 1000 MG tablet Take 1,000 mg by mouth daily.     diclofenac Sodium (VOLTAREN) 1 % GEL Apply 1 application topically 4 (four) times daily as needed (pain).     diphenhydrAMINE (BENADRYL) 25 mg capsule Take 25 mg by mouth every 6 (six) hours as needed for itching.     diphenhydrAMINE-zinc acetate (BENADRYL) cream Apply 1 application  topically 3 (three) times daily as needed for itching.     ezetimibe (ZETIA) 10 MG tablet Take 1 tablet (10 mg total) by mouth daily. 90 tablet 3   ferrous sulfate 325 (65 FE) MG EC tablet TAKE 1 TABLET EVERY DAY WITH BREAKFAST 90 tablet 1   fluticasone (FLONASE) 50 MCG/ACT nasal spray Place 1 spray into both nostrils daily as needed for allergies.     furosemide (LASIX) 40 MG tablet Take 40 mg by mouth daily as needed for edema.     Ketotifen Fumarate (EYE ITCH RELIEF OP) Place 1 drop into both eyes daily as needed (allergies).     Polyethyl Glycol-Propyl Glycol 0.4-0.3 % SOLN Place 1-2 drops into both eyes 3 (three) times daily as needed (for dry eyes.).     warfarin (COUMADIN) 5 MG tablet TAKE 1 TABLET DAILY EXCEPT 1/2 TABLET ON MONDAYS AND FRIDAYS OR AS DIRECTED BY COUMADIN CLINIC 90 tablet 0   levothyroxine (SYNTHROID) 75 MCG tablet TAKE 1 TABLET BY MOUTH DAILY BEFORE BREAKFAST. 90 tablet 1   No facility-administered medications prior to visit.    PAST MEDICAL HISTORY: Past Medical History:  Diagnosis Date   Allergic rhinitis    Alopecia 2/2 beta blockers    Anemia    Arthritis    Atrial fibrillation -persistent cardiologist-  dr klein/  primary EP -- dr Tawanna Sat ([facility])   a. s/p PVI [name] 2010;  b. on tikosyn/coumadin;  c. 05/2009 Echo: EF 60-65%, Gr 2 DD. (first dx 09/ 2007)   Bilateral lower extremity edema    Bleeding hemorrhoid    Carotid stenosis    mild (hosp 3/11)- consult by vasc/ Dr Donnetta Hutching   Complication of anesthesia    hard to wake   Diverticulosis of colon    Dyspnea    on exertion-climbing stairs   Fatty liver    H/O cardiac radiofrequency ablation    01/ 2008 at [facility] /  03/ 2010  at [facility] failure with preserved ejection fraction [facility])    History of adenomatous polyp of colon    tubular adenoma's   History of cardiomyopathy    secondary tachycardia-induced cardiomyopathy -- resolved 2014   History of squamous cell carcinoma in situ  (SCCIS) of skin    05/ 2017  nasal bridge and right medial knee   History of transient ischemic attack (TIA)    01-24-2005 and 06-12-2009   Hyperlipidemia    Hypothyroidism    Mild intermittent asthma    reacts to cats   Mixed stress and urge urinary incontinence    Presence of permanent cardiac pacemaker    was put in 07/2019   Pulmonary nodule    S/P AV nodal ablation 07/28/19 07/29/2019   S/P mitral valve repair 10-23-1998  dr Boyce Medici at [facility]   for MVP and regurg. (annuloplasty ring procedure)   S/P placement of cardiac pacemaker MDT 07/28/19 07/29/2019    PAST SURGICAL HISTORY: Past  Surgical History:  Procedure Laterality Date   APPENDECTOMY  1978   AV NODE ABLATION N/A 07/28/2019   Procedure: AV NODE ABLATION;  Surgeon: Deboraha Sprang, MD;  Location: [place] CV LAB;  Service: Cardiovascular;  Laterality: N/A;   BUBBLE STUDY  06/19/2019   Procedure: BUBBLE STUDY;  Surgeon: Pixie Casino, MD;  Location: [facility] ENDOSCOPY;  Service: Cardiovascular;;   CARDIAC ELECTROPHYSIOLOGY [place]  01/ 2008    at [place]   right-sided ablation atrial flutter   [place]  03/ 2010   dr Jaymes Graff at [facility]   AV node ablation and pulmonary vein isolation for atrial fib   CARDIOVERSION  06-18-2006;  07-13-2006;  10-19-2010;  10-27-2010   COLONOSCOPY     COLONOSCOPY WITH PROPOFOL N/A 10/13/2017   Procedure: COLONOSCOPY WITH PROPOFOL;  Surgeon: [name], MD;  Location: [facility] ENDOSCOPY;  Service: Gastroenterology;  Laterality: N/A;   COLONOSCOPY WITH PROPOFOL N/A 02/20/2020   Procedure: COLONOSCOPY WITH PROPOFOL;  Surgeon: Lesly Rubenstein, MD;  Location: [facility] ENDOSCOPY;  Service: Endoscopy;  Laterality: N/A;   CYSTO/ TRANSURETHRAL COLLAGEN INJECTION THERAPY  07-26-2007   dr Matilde Sprang   DILATION AND CURETTAGE OF UTERUS     ESOPHAGOGASTRODUODENOSCOPY (EGD) WITH PROPOFOL N/A 10/13/2017   Procedure: ESOPHAGOGASTRODUODENOSCOPY (EGD)  WITH PROPOFOL;  Surgeon: [name], MD;  Location: [facility] ENDOSCOPY;  Service: Gastroenterology;  Laterality: N/A;   EVALUATION UNDER ANESTHESIA WITH HEMORRHOIDECTOMY N/A 04/09/2020   Procedure: EXAM UNDER ANESTHESIA WITH HEMORRHOIDECTOMY;  Surgeon: Jules Husbands, MD;  Location: [facility] ORS;  Service: General;  Laterality: N/A;   EXCISIONAL HEMORRHOIDECTOMY  1980s   GIVENS CAPSULE STUDY N/A 12/08/2017   Procedure: GIVENS CAPSULE STUDY;  Surgeon: [name], MD;  Location: [facility] ENDOSCOPY;  Service: Gastroenterology;  Laterality: N/A;   HEMORRHOID SURGERY N/A 10/29/2016   Procedure: HEMORRHOIDECTOMY;  Surgeon: Leighton Ruff, MD;  Location: [facility];  Service: General;  Laterality: N/A;   MITRAL VALVE ANNULOPLASTY  10/23/1998   "Model [id number]; Campbell Lerner [id number]"; size 7mm; [facility]; Dr. Boyce Medici   PACEMAKER IMPLANT N/A 07/28/2019   Procedure: PACEMAKER IMPLANT;  Surgeon: Deboraha Sprang, MD;  Location: [place] CV LAB;  Service: Cardiovascular;  Laterality: N/A;   [place]   TEE WITH CARDIOVERSION  05-06-2006 at [facility];  01-02-2013 at [facility]   TEE WITHOUT CARDIOVERSION N/A 06/19/2019   Procedure: TRANSESOPHAGEAL ECHOCARDIOGRAM (TEE);  Surgeon: Pixie Casino, MD;  Location: [facility] ENDOSCOPY;  Service: Cardiovascular;  Laterality: N/A;   TOTAL HIP ARTHROPLASTY Left 05/04/2017   Procedure: LEFT TOTAL HIP ARTHROPLASTY ANTERIOR APPROACH;  Surgeon: Mcarthur Rossetti, MD;  Location: [place];  Service: Orthopedics;  Laterality: Left;   TRANSTHORACIC ECHOCARDIOGRAM  05-01-2015   dr Caryl Comes   ef 50-55%/  mild AV sclerosis without stenosis/  post MV repair with mild central MR (valve area by pressure half-time 2cm^2,  valve area by continutity equation 0.91cm^2, peak grandiant 76mmHg)/  severe LAE/ mild TR/ mild RAE    TUBAL LIGATION Bilateral 1978    FAMILY HISTORY: Family History  Problem Relation Age of Onset   Lung cancer Father        smoker, died at 92   Alcohol  abuse Father    Cancer Father        bladder and lung CA smoker   Sudden death Other    Breast cancer Neg Hx    Stroke Neg Hx     SOCIAL HISTORY:  Social History   Socioeconomic History   Marital status: Widowed    Spouse name: Not on file   Number of children: 6   Years of education: Not on file   Highest education level: Not on file  Occupational History   Occupation: realtor    Employer: RETIRED  Tobacco Use   Smoking status: Never   Smokeless tobacco: Never  Vaping Use   Vaping Use: Never used  Substance and Sexual Activity   Alcohol use: Yes    Alcohol/week: 0.0 standard drinks    Comment: seldom   Drug use: No   Sexual activity: Not Currently  Other Topics Concern   Not on file  Social History Narrative   Retired. Daily Caffeine use: 2 daily    Social Determinants of Health   Financial Resource Strain: Low Risk    Difficulty of Paying Living Expenses: Not hard at all  Food Insecurity: No Food Insecurity   Worried About Charity fundraiser in the Last Year: Never true   Ran Out of Food in the Last Year: Never true  Transportation Needs: No Transportation Needs   Lack of Transportation (Medical): No   Lack of Transportation (Non-Medical): No  Physical Activity: Sufficiently Active   Days of Exercise per Week: 3 days   Minutes of Exercise per Session: 60 min  Stress: No Stress Concern Present   Feeling of Stress : Not at all  Social Connections: Not on file  Intimate Partner Violence: Not At Risk   Fear of Current or Ex-Partner: No   Emotionally Abused: No   Physically Abused: No   Sexually Abused: No     PHYSICAL EXAM  GENERAL EXAM/CONSTITUTIONAL: Vitals:  Vitals:   12/24/20 1334  BP: 118/82  Pulse: 72  Weight: 154 lb (69.9 kg)  Height: 5\' 4"  (1.626 m)   Body mass index is 26.43 kg/m. Wt Readings from Last 3 Encounters:  12/24/20 154 lb (69.9 kg)  12/17/20 155 lb (70.3 kg)  11/20/20 152 lb 9 oz (69.2 kg)   Patient is in no distress;  well developed, nourished and groomed; neck is supple  EYES: Pupils round and reactive to light, Visual fields full to confrontation, Extraocular movements intacts,   MUSCULOSKELETAL: Gait, strength, tone, movements noted in Neurologic exam below  NEUROLOGIC: MENTAL STATUS:  MMSE - [place] Exam 12/24/2020 03/08/2020 09/05/2018  Orientation to time 5 5 5   Orientation to Place 5 5 5   Registration 3 3 3   Attention/ Calculation 2 5 0  Recall 3 3 3   Recall-comments - - -  Language- name 2 objects 2 - 0  Language- repeat 1 1 1   Language- follow 3 step command 3 - 0  Language- read & follow direction 1 - 0  Write a sentence 1 - 0  Copy design 1 - 0  Total score 27 - 17   awake, alert, oriented to person, place and time recent and remote memory intact normal attention and concentration language fluent, comprehension intact, naming intact fund of knowledge appropriate  CRANIAL NERVE:  2nd, 3rd, 4th, 6th - pupils equal and reactive to light, visual fields full to confrontation, extraocular muscles intact, no nystagmus 5th - facial sensation symmetric 7th - facial strength symmetric 8th - hearing intact 9th - palate elevates symmetrically, uvula midline 11th - shoulder shrug symmetric 12th - tongue protrusion midline  MOTOR:  normal bulk and tone, full strength in the BUE, BLE  SENSORY:  normal and symmetric to light touch,  pinprick, temperature, vibration  COORDINATION:  finger-nose-finger, fine finger movements normal  REFLEXES:  deep tendon reflexes present and symmetric  GAIT/STATION:  normal    DIAGNOSTIC DATA (LABS, IMAGING, TESTING) - I reviewed patient records, labs, notes, testing and imaging myself where available.  Lab Results  Component Value Date   WBC 4.0 12/20/2020   HGB 13.0 12/20/2020   HCT 40.6 12/20/2020   MCV 88.0 12/20/2020   PLT 131.0 (L) 12/20/2020      Component Value Date/Time   NA 142 11/05/2020 2039   NA 142 05/13/2020  1111   NA 139 06/26/2013 2220   K 4.5 11/05/2020 2039   K 3.5 06/26/2013 2220   CL 106 11/05/2020 2039   CL 106 06/26/2013 2220   CO2 30 11/05/2020 2039   CO2 27 06/26/2013 2220   GLUCOSE 90 11/05/2020 2039   GLUCOSE 136 (H) 06/26/2013 2220   BUN 19 11/05/2020 2039   BUN 22 05/13/2020 1111   BUN 14 06/26/2013 2220   CREATININE 0.65 11/05/2020 2039   CREATININE 0.87 02/04/2018 1701   CALCIUM 9.1 11/05/2020 2039   CALCIUM 8.3 (L) 06/26/2013 2220   PROT 6.9 11/05/2020 2039   PROT 7.1 02/28/2013 1110   ALBUMIN 3.8 11/05/2020 2039   ALBUMIN 3.6 02/28/2013 1110   AST 39 11/05/2020 2039   AST 25 02/28/2013 1110   ALT 48 (H) 11/05/2020 2039   ALT 19 02/28/2013 1110   ALKPHOS 46 11/05/2020 2039   ALKPHOS 48 02/28/2013 1110   BILITOT 0.6 11/05/2020 2039   BILITOT 0.6 02/28/2013 1110   GFRNONAA >60 11/05/2020 2039   GFRNONAA 70 05/15/2015 0959   GFRAA 92 05/13/2020 1111   GFRAA 81 05/15/2015 0959   Lab Results  Component Value Date   CHOL 165 12/20/2020   HDL 46.30 12/20/2020   LDLCALC 105 (H) 12/20/2020   LDLDIRECT 203.8 01/09/2008   TRIG 71.0 12/20/2020   CHOLHDL 4 12/20/2020   Lab Results  Component Value Date   HGBA1C 5.6 03/04/2020   Lab Results  Component Value Date   VITAMINB12 448 11/06/2020   Lab Results  Component Value Date   TSH 3.06 12/20/2020     Head CT 11/05/2020 No evidence of acute intracranial abnormality. 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 Mild small vessel ischemic changes    ASSESSMENT AND PLAN  87 y.o. year old female with past medical history of atrial fibrillation, coronary artery disease status post pacemaker placement, hypertension, hyperlipidemia who is presenting after altered sensorium in the setting of being near magnetic oven range.  Since moving the oven range out of the house and into a pump house in her property, her symptoms have improved.  She mentioned that she just wanted to be checked to make sure everything was fine.  She  had a recent head CT which did not show any acute intracranial abnormality but showed old right MCA and left PCA infarct.  From a neurological standpoint no further work-up indicated.  Advised patient to call or return to the office if symptoms are not improved or if she has any new additional symptoms or concerns.    1. Altered sensorium     PLAN: Continue your current medications  Return as needed    No orders of the defined types were placed in this encounter.   No orders of the defined types were placed in this encounter.   Return if symptoms worsen or fail to improve.    Alric Ran, MD 12/25/2020,  6:22 PM  [facility] [address], [place], [place] [address] [phone]

## 2020-12-24 NOTE — Patient Instructions (Signed)
Continue your current medications.

## 2020-12-25 ENCOUNTER — Encounter: Payer: Self-pay | Admitting: Neurology

## 2020-12-25 ENCOUNTER — Other Ambulatory Visit: Payer: Self-pay | Admitting: Family Medicine

## 2020-12-26 ENCOUNTER — Ambulatory Visit: Payer: Medicare Other

## 2020-12-27 ENCOUNTER — Other Ambulatory Visit: Payer: Self-pay

## 2020-12-27 ENCOUNTER — Ambulatory Visit (INDEPENDENT_AMBULATORY_CARE_PROVIDER_SITE_OTHER): Payer: Medicare Other | Admitting: Allergy

## 2020-12-27 ENCOUNTER — Encounter: Payer: Self-pay | Admitting: Allergy

## 2020-12-27 VITALS — BP 126/84 | HR 71 | Temp 97.4°F | Resp 16 | Ht 64.0 in | Wt 154.2 lb

## 2020-12-27 DIAGNOSIS — J3089 Other allergic rhinitis: Secondary | ICD-10-CM

## 2020-12-27 DIAGNOSIS — H1013 Acute atopic conjunctivitis, bilateral: Secondary | ICD-10-CM | POA: Diagnosis not present

## 2020-12-27 DIAGNOSIS — J4520 Mild intermittent asthma, uncomplicated: Secondary | ICD-10-CM | POA: Diagnosis not present

## 2020-12-27 DIAGNOSIS — S90861D Insect bite (nonvenomous), right foot, subsequent encounter: Secondary | ICD-10-CM | POA: Diagnosis not present

## 2020-12-27 DIAGNOSIS — W57XXXD Bitten or stung by nonvenomous insect and other nonvenomous arthropods, subsequent encounter: Secondary | ICD-10-CM | POA: Diagnosis not present

## 2020-12-27 MED ORDER — HYDROXYZINE HCL 25 MG PO TABS
25.0000 mg | ORAL_TABLET | Freq: Four times a day (QID) | ORAL | 0 refills | Status: DC | PRN
Start: 2020-12-27 — End: 2022-05-27

## 2020-12-27 NOTE — Progress Notes (Signed)
New Patient Note  RE: [name] MRN: [id number] DOB: September 04, 1932 Date of Office Visit: 12/27/2020   Primary care provider: Abner Greenspan, MD  Chief Complaint: Allergies  History of present illness: [name] is a 87 y.o. female presenting today for evaluation of allergies.   She reports nasal drainage, itchy/watery eyes, occasional sneezing but can be year-round symptoms.  She is not prone to developing sinus infections.  She states she is allergic to cats, dogs, dust mites especially in enclosed areas.   She will use a nasal spray if she has to go out in a crowd people.  She has used Flonase before.  She does use ketotifen eye drop as needed.   She keeps benadryl on hand to help with symptoms like itching.   She also states she is very reactive to bug bites. She recalls getting a bite on her foot recently and the itch was so bad that she feels like she could have cut off her foot for relief.  she used ice to help with the itch.  She applies bug repellant and tries to wear boots if she has to work outside.  She has had 2 rounds of prednisone taper to help with the itch and inflammation related to her bites this summer.   She states she has had wheezing when she has been around things like bonfires.  She states she does keep an albuterol inhaler just in case.    She does try to not take medication if she does not need to.    Review of systems: Review of Systems  Constitutional: Negative.   HENT:         See HPI  Eyes:        See HPI  Respiratory: Negative.    Cardiovascular: Negative.   Gastrointestinal: Negative.   Musculoskeletal: Negative.   Skin:  Positive for itching.  Neurological: Negative.    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 s/p PVI [name] 2010;  b. on tikosyn/coumadin;  c. 05/2009 Echo: EF 60-65%, Gr 2 DD. (first dx 09/ 2007)   Bilateral lower extremity edema    Bleeding hemorrhoid    Carotid stenosis    mild (hosp 3/11)- consult by vasc/ Dr Donnetta Hutching   Complication of anesthesia    hard to wake   Diverticulosis of colon    Dyspnea    on exertion-climbing stairs   Fatty liver    H/O cardiac radiofrequency ablation    01/ 2008 at [facility] /  03/ 2010  at [facility] failure with preserved ejection fraction [facility])    History of adenomatous polyp of colon    tubular adenoma's   History of cardiomyopathy    secondary tachycardia-induced cardiomyopathy -- resolved 2014   History of squamous cell carcinoma in situ (SCCIS) of skin    05/ 2017  nasal bridge and right medial knee   History of transient ischemic attack (TIA)    01-24-2005 and 06-12-2009   Hyperlipidemia    Hypothyroidism    Mild intermittent asthma    reacts to cats   Mixed stress and urge urinary incontinence    Presence of  permanent cardiac pacemaker    was put in 07/2019   Pulmonary nodule    S/P AV nodal ablation 07/28/19 07/29/2019   S/P mitral valve repair 10-23-1998  dr Boyce Medici at [facility]   for MVP and regurg. (annuloplasty ring procedure)   S/P placement of cardiac pacemaker MDT 07/28/19 07/29/2019    Past surgical history: Past Surgical History:  Procedure Laterality Date   APPENDECTOMY  1978   AV NODE ABLATION N/A 07/28/2019   Procedure: AV NODE ABLATION;  Surgeon: Deboraha Sprang, MD;  Location: [place] CV LAB;  Service: Cardiovascular;  Laterality: N/A;   BUBBLE STUDY  06/19/2019   Procedure: BUBBLE STUDY;  Surgeon: Pixie Casino, MD;  Location: [facility] ENDOSCOPY;  Service: Cardiovascular;;   CARDIAC ELECTROPHYSIOLOGY [place]  01/ 2008    at [place]   right-sided ablation atrial flutter   [place]  03/ 2010   dr Jaymes Graff at  [facility]   AV node ablation and pulmonary vein isolation for atrial fib   CARDIOVERSION  06-18-2006;  07-13-2006;  10-19-2010;  10-27-2010   COLONOSCOPY     COLONOSCOPY WITH PROPOFOL N/A 10/13/2017   Procedure: COLONOSCOPY WITH PROPOFOL;  Surgeon: [name], MD;  Location: [facility] ENDOSCOPY;  Service: Gastroenterology;  Laterality: N/A;   COLONOSCOPY WITH PROPOFOL N/A 02/20/2020   Procedure: COLONOSCOPY WITH PROPOFOL;  Surgeon: Lesly Rubenstein, MD;  Location: [facility] ENDOSCOPY;  Service: Endoscopy;  Laterality: N/A;   CYSTO/ TRANSURETHRAL COLLAGEN INJECTION THERAPY  07-26-2007   dr Matilde Sprang   DILATION AND CURETTAGE OF UTERUS     ESOPHAGOGASTRODUODENOSCOPY (EGD) WITH PROPOFOL N/A 10/13/2017   Procedure: ESOPHAGOGASTRODUODENOSCOPY (EGD) WITH PROPOFOL;  Surgeon: [name], MD;  Location: [facility] ENDOSCOPY;  Service: Gastroenterology;  Laterality: N/A;   EVALUATION UNDER ANESTHESIA WITH HEMORRHOIDECTOMY N/A 04/09/2020   Procedure: EXAM UNDER ANESTHESIA WITH HEMORRHOIDECTOMY;  Surgeon: Jules Husbands, MD;  Location: [facility] ORS;  Service: General;  Laterality: N/A;   EXCISIONAL HEMORRHOIDECTOMY  1980s   GIVENS CAPSULE STUDY N/A 12/08/2017   Procedure: GIVENS CAPSULE STUDY;  Surgeon: [name], MD;  Location: [facility] ENDOSCOPY;  Service: Gastroenterology;  Laterality: N/A;   HEMORRHOID SURGERY N/A 10/29/2016   Procedure: HEMORRHOIDECTOMY;  Surgeon: Leighton Ruff, MD;  Location: [facility];  Service: General;  Laterality: N/A;   MITRAL VALVE ANNULOPLASTY  10/23/1998   "Model [id number]; [employer] [id number]"; size 73mm; [facility]; Dr. Boyce Medici   PACEMAKER IMPLANT N/A 07/28/2019   Procedure: PACEMAKER IMPLANT;  Surgeon: Deboraha Sprang, MD;  Location: [place] CV LAB;  Service: Cardiovascular;  Laterality: N/A;   [place]   TEE WITH CARDIOVERSION  05-06-2006 at [facility];  01-02-2013 at [facility]   TEE WITHOUT CARDIOVERSION N/A 06/19/2019   Procedure: TRANSESOPHAGEAL ECHOCARDIOGRAM (TEE);   Surgeon: Pixie Casino, MD;  Location: [facility] ENDOSCOPY;  Service: Cardiovascular;  Laterality: N/A;   TOTAL HIP ARTHROPLASTY Left 05/04/2017   Procedure: LEFT TOTAL HIP ARTHROPLASTY ANTERIOR APPROACH;  Surgeon: Mcarthur Rossetti, MD;  Location: [place];  Service: Orthopedics;  Laterality: Left;   TRANSTHORACIC ECHOCARDIOGRAM  05-01-2015   dr Caryl Comes   ef 50-55%/  mild AV sclerosis without stenosis/  post MV repair with mild central MR (valve area by pressure half-time 2cm^2,  valve area by continutity equation 0.91cm^2, peak grandiant 103mmHg)/  severe LAE/ mild TR/ mild RAE    TUBAL LIGATION Bilateral 1978    Family history:  Family History  Problem Relation Age of  Onset   Lung cancer Father        smoker, died at 34   Alcohol abuse Father    Cancer Father        bladder and lung CA smoker   Sudden death Other    Breast cancer Neg Hx    Stroke Neg Hx     Social history: Lives in a home with out carpeting with heat pump heating and central cooling.  No pets in the home.  There are ducks outside the home.  No concern for water damage, mildew or roaches in the home.  She is retired.  She denies a smoking history.   Medication List: Current Outpatient Medications  Medication Sig Dispense Refill   acetaminophen (TYLENOL) 500 MG tablet Take 500 mg by mouth every 6 (six) hours as needed for moderate pain.     alendronate (FOSAMAX) 70 MG tablet TAKE 1 TABLET EVERY 7 DAYS. TAKE WITH A FULL GLASS OF WATER ON AN EMPTY STOMACH. 12 tablet 3   Ascorbic Acid (VITAMIN C) 1000 MG tablet Take 1,000 mg by mouth daily.     diclofenac Sodium (VOLTAREN) 1 % GEL Apply 1 application topically 4 (four) times daily as needed (pain).     diphenhydrAMINE (BENADRYL) 25 mg capsule Take 25 mg by mouth every 6 (six) hours as needed for itching.     diphenhydrAMINE-zinc acetate (BENADRYL) cream Apply 1 application topically 3 (three) times daily as needed for itching.     ezetimibe (ZETIA) 10 MG tablet Take 1  tablet (10 mg total) by mouth daily. 90 tablet 3   ferrous sulfate 325 (65 FE) MG EC tablet TAKE 1 TABLET EVERY DAY WITH BREAKFAST 90 tablet 1   fluticasone (FLONASE) 50 MCG/ACT nasal spray Place 1 spray into both nostrils daily as needed for allergies.     furosemide (LASIX) 40 MG tablet Take 40 mg by mouth daily as needed for edema.     Ketotifen Fumarate (EYE ITCH RELIEF OP) Place 1 drop into both eyes daily as needed (allergies).     levothyroxine (SYNTHROID) 75 MCG tablet Take 1 tablet (75 mcg total) by mouth daily before breakfast. 90 tablet 1   Polyethyl Glycol-Propyl Glycol 0.4-0.3 % SOLN Place 1-2 drops into both eyes 3 (three) times daily as needed (for dry eyes.).     warfarin (COUMADIN) 5 MG tablet TAKE 1 TABLET DAILY EXCEPT 1/2 TABLET ON MONDAYS AND FRIDAYS OR AS DIRECTED BY COUMADIN CLINIC 90 tablet 0   No current facility-administered medications for this visit.    Known medication allergies: Allergies  Allergen Reactions   Amiodarone Swelling    SWELLING REACTION UNSPECIFIED    Penicillins Hives and Rash    Has patient had a PCN reaction causing immediate rash, facial/tongue/throat swelling, SOB or lightheadedness with hypotension: No Has patient had a PCN reaction causing severe rash involving mucus membranes or skin necrosis: No Has patient had a PCN reaction that required hospitalization:Patient was inpatient when reaction occurred Has patient had a PCN reaction occurring within the last 10 years: No If all of the above answers are "NO", then may proceed with Cephalosporin use   Amiodarone Hcl Swelling    SWELLING REACTION UNSPECIFIED    Statins Rash    REACTION: rash     Physical examination: Blood pressure 126/84, pulse 71, temperature (!) 97.4 F (36.3 C), temperature source Temporal, resp. rate 16, height 5\' 4"  (1.626 m), weight 154 lb 4 oz (70 kg), SpO2 97 %.  General: Alert, interactive, in no acute distress. HEENT: PERRLA, TMs pearly gray, turbinates  minimally edematous with clear discharge, post-pharynx non erythematous. Neck: Supple without lymphadenopathy. Lungs: Clear to auscultation without wheezing, rhonchi or rales. {no increased work of breathing. CV: Normal S1, S2 without murmurs. Abdomen: Nondistended, nontender. Skin: Warm and dry, without lesions or rashes. Extremities:  No clubbing, cyanosis or edema. Neuro:   Grossly intact.  Diagnositics/Labs:  Allergy testing: Environmental allergy skin prick testing is positive to mugwort, ash, birch, walnut, cat and pullulara Allergy testing results were read and interpreted by provider, documented by clinical staff.   Assessment and plan:   Allergic rhinitis with conjunctivitis -environmental allergy testing is positive to weed pollen, tree pollen, mold and cat -allergen avoidance measures discussed/handouts provided -continue Ketotifen eye drop 1 drop each eye twice a day as needed for itchy/watery eyes -continue Flonase 2 sprays each nostril daily for 1-2 weeks at a time before stopping once nasal congestion improves for maximum benefit -can use long-acting antihistamine Xyzal 5mg  daily as needed for general allergy symptom control  Insect bite sensitivity -continue use of bug repellent and covering skin/feet to help prevent bites -if bitten you can:  ice affected area use oral antihistamine (like Hydroxyzine 1/2 - 1 tab every 6 hours as needed or at bedtime) for itch control.   Hydroxyzine will make you drowsy (similar to benadryl but longer effect) use oral anti-inflammatory (like ibuprofen) for pain control topical corticosteroid (like Hydrocortisone cream 1%) for itch/inflammation control  Reactive airway -have access to albuterol inhaler 2 puffs every 4-6 hours as needed for cough/wheeze/shortness of breath/chest tightness.  May use 15-20 minutes prior to activity.   Monitor frequency of use.    Follow-up in 6 months or sooner if needed  I appreciate the opportunity  to take part in [facility] care. Please do not hesitate to contact me with questions.  Sincerely,   Prudy Feeler, MD Allergy/Immunology [facility]ergy [facility] [place]

## 2020-12-27 NOTE — Patient Instructions (Addendum)
Allergic rhinitis with conjunctivitis -environmental allergy testing is positive to weed pollen, tree pollen, mold and cat -allergen avoidance measures discussed/handouts provided -continue Ketotifen eye drop 1 drop each eye twice a day as needed for itchy/watery eyes -continue Flonase 2 sprays each nostril daily for 1-2 weeks at a time before stopping once nasal congestion improves for maximum benefit -can use long-acting antihistamine Xyzal 5mg  daily as needed for general allergy symptom control  Insect bite sensitivity -continue use of bug repellent and covering skin/feet to help prevent bites -if bitten you can:  ice affected area use oral antihistamine (like Hydroxyzine 1/2 - 1 tab every 6 hours as needed or at bedtime) for itch control.   Hydroxyzine will make you drowsy (similar to benadryl but longer effect) use oral anti-inflammatory (like ibuprofen) for pain control topical corticosteroid (like Hydrocortisone cream 1%) for itch/inflammation control  Reactive airway -have access to albuterol inhaler 2 puffs every 4-6 hours as needed for cough/wheeze/shortness of breath/chest tightness.  May use 15-20 minutes prior to activity.   Monitor frequency of use.    Follow-up in 6 months or sooner if needed

## 2021-01-01 ENCOUNTER — Ambulatory Visit: Payer: Medicare Other | Admitting: Obstetrics and Gynecology

## 2021-01-03 ENCOUNTER — Ambulatory Visit (INDEPENDENT_AMBULATORY_CARE_PROVIDER_SITE_OTHER): Payer: Medicare Other

## 2021-01-03 ENCOUNTER — Other Ambulatory Visit: Payer: Self-pay

## 2021-01-03 DIAGNOSIS — Z5181 Encounter for therapeutic drug level monitoring: Secondary | ICD-10-CM

## 2021-01-03 DIAGNOSIS — Z7901 Long term (current) use of anticoagulants: Secondary | ICD-10-CM | POA: Diagnosis not present

## 2021-01-03 DIAGNOSIS — I4891 Unspecified atrial fibrillation: Secondary | ICD-10-CM

## 2021-01-03 LAB — POCT INR: INR: 1.8 — AB (ref 2.0–3.0)

## 2021-01-03 NOTE — Patient Instructions (Addendum)
Pre visit review using our clinic review tool, if applicable. No additional management support is needed unless otherwise documented below in the visit note.  Increase dose today to 2 tablet and then change weekly dose to take 1 tablet daily except take 1 1/2 tablets on Wednesdays. Recheck in 2 wks.

## 2021-01-05 NOTE — Progress Notes (Signed)
Agree. Thanks

## 2021-01-10 ENCOUNTER — Telehealth: Payer: Self-pay | Admitting: Family Medicine

## 2021-01-10 NOTE — Telephone Encounter (Signed)
Pt would need an appt with PCP to discuss this before any referrals are placed, please schedule appt with PCP to discuss this

## 2021-01-10 NOTE — Telephone Encounter (Signed)
Pt called asking for a referral to the best Radiologist.Pt states she gets dizzy and losing her balance when she's around electrical equipment.

## 2021-01-14 ENCOUNTER — Other Ambulatory Visit: Payer: Self-pay

## 2021-01-14 ENCOUNTER — Encounter: Payer: Self-pay | Admitting: Family Medicine

## 2021-01-14 ENCOUNTER — Ambulatory Visit (INDEPENDENT_AMBULATORY_CARE_PROVIDER_SITE_OTHER): Payer: Medicare Other | Admitting: Family Medicine

## 2021-01-14 VITALS — BP 132/70 | HR 82 | Temp 98.7°F | Resp 16 | Ht 64.0 in | Wt 156.0 lb

## 2021-01-14 DIAGNOSIS — R4189 Other symptoms and signs involving cognitive functions and awareness: Secondary | ICD-10-CM

## 2021-01-14 DIAGNOSIS — F4321 Adjustment disorder with depressed mood: Secondary | ICD-10-CM | POA: Diagnosis not present

## 2021-01-14 DIAGNOSIS — R519 Headache, unspecified: Secondary | ICD-10-CM | POA: Diagnosis not present

## 2021-01-14 DIAGNOSIS — R42 Dizziness and giddiness: Secondary | ICD-10-CM | POA: Diagnosis not present

## 2021-01-14 NOTE — Progress Notes (Signed)
Subjective:    Patient ID: [name], female    DOB: [birth year], 87 y.o.   MRN: [id number]  This visit occurred during the SARS-CoV-2 public health emergency.  Safety protocols were in place, including screening questions prior to the visit, additional usage of staff PPE, and extensive cleaning of exam room while observing appropriate contact time as indicated for disinfecting solutions.   HPI Pt presents with c/o mental fogginess/light headed    Wt Readings from Last 3 Encounters:  01/14/21 156 lb (70.8 kg)  12/27/20 154 lb 4 oz (70 kg)  12/24/20 154 lb (69.9 kg)   26.78 kg/m   Notes she gets dizzy and loses balance when around electrical equipment   She has a pacemaker   She saw Dr April Manson (neurology) earlier this month -noting strange sensation when around electrical machinery  Induction range   Did go to the ER prior to this and CT of head was normal  (showed some small vessel isch change and old infarcts)   Last MRI was 3/21 IMPRESSION: 1. 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 2. Solitary focus of petechial hemorrhage versus distal vessel thrombus, but no malignant hemorrhagic transformation or mass effect. 3. Otherwise stable chronic small vessel disease.   She has tried cooking on the new stove and feels disorientated  Now all electronics seem to affect her  Unsteady on her feet  Has walked into walls and has some contusions on arms   Feels fuzzy-affecting thinking and walking and she is concerned  Worried about brain   "This is not fake"  Worried about radiation    Sits near a stone wall in her house  [place] headed  Like a dull headache (like when she gets off a ride)  Cannot get near the induction range-moved out to her well house  If she goes in then feels like a force hits her   Tried to use a blower with a lithium battery  It shocked her  Has tried several times , makes  her feel "zoomy"  She put the battery in a bucket lined with foil then carried it away   Induction range never bothered her until she got the pacemaker    Of note: grief  Lost son and DIL close   Does not feel paranoid  No hallucinations or delusions   Has not done grief counseling - not interested yet    BP Readings from Last 3 Encounters:  01/14/21 132/70  12/27/20 126/84  12/24/20 118/82   Pulse Readings from Last 3 Encounters:  01/14/21 82  12/27/20 71  12/24/20 72    She is still in grief but does not think that her symptoms are psychologic Does feel depressed  Pushes herself to do something physical /demanding every day  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   Writes everything down /does note some short term memory changes  Otherwise quite sharp -family has not noted cognitive changes    Recent labs Lab Results  Component Value Date   WBC 4.0 12/20/2020   HGB 13.0 12/20/2020   HCT 40.6 12/20/2020   MCV 88.0 12/20/2020   PLT 131.0 (L) 12/20/2020   Lab Results  Component Value Date   INR 1.8 (A) 01/03/2021   INR 1.6 (A) 12/20/2020   INR 1.7 (A) 11/20/2020   On coumadin Lab Results  Component Value Date   IRON 44 12/20/2020  TIBC 350 12/28/2018   FERRITIN 50.6 12/20/2020    Lab Results  Component Value Date   CHOL 165 12/20/2020   HDL 46.30 12/20/2020   LDLCALC 105 (H) 12/20/2020   LDLDIRECT 203.8 01/09/2008   TRIG 71.0 12/20/2020   CHOLHDL 4 12/20/2020   Hypothyroid Lab Results  Component Value Date   TSH 3.06 12/20/2020    Patient Active Problem List   Diagnosis Date Noted   Brain fog 01/14/2021   Pressure in head 01/14/2021   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/20/2020   Post-menopausal bleeding 11/20/2020   Facial paresthesia 10/14/2020   Insect bite 09/20/2020   Grief reaction 09/20/2020   Hearing loss 06/13/2020   Atrial fibrillation ([place]) 04/24/2020   Pelvic pain 03/11/2020   Routine general medical examination at  a health care facility 03/11/2020   Elevated glucose 03/03/2020   Rectal bleeding 02/18/2020   History of TIA (transient ischemic attack) 02/18/2020   S/P AV nodal ablation 07/28/19 07/29/2019   S/P placement of cardiac pacemaker MDT 07/28/19 07/29/2019   AV block 07/28/2019   CVA (cerebral vascular accident) ([place]) 06/16/2019   Facial tingling 11/29/2018   Tremor of left hand 11/29/2018   Medicare annual wellness visit, subsequent 11/24/2018   Dysuria 02/06/2018   Dizzy 02/04/2018   Iron deficiency anemia 10/21/2017   Rapid atrial fibrillation ([place]) 08/19/2017   Constipation 08/02/2017   Numbness and tingling 07/14/2017   Paresthesia 07/14/2017   Unilateral primary osteoarthritis, left hip 05/04/2017   Status post total replacement of left hip 05/04/2017   Hip osteoarthritis 04/27/2017   Long term (current) use of anticoagulants 03/04/2017   Venous stasis dermatitis of both lower extremities 01/08/2017   Impacted cerumen of right ear 11/20/2016   Osteopenia 10/25/2016   Pedal edema 08/26/2016   Varicose veins of both lower extremities 08/26/2016   Estrogen deficiency 08/26/2016   Screening mammogram, encounter for 08/26/2016   Hemorrhoids 08/26/2016   History of nonmelanoma skin cancer 01/01/2016   Pruritus 10/04/2015   Urticaria 08/22/2014   Hip pain 08/02/2014   Left knee pain 08/02/2014   Chronic cough 05/08/2014   Hematochezia 04/09/2014   Colon cancer screening 08/16/2013   Encounter for therapeutic drug monitoring 04/20/2013   Left ovarian cyst 03/14/2013   Palpitations 04/15/2012   COLONIC POLYPS, ADENOMATOUS, HX OF 09/18/2009   PULMONARY NODULE 12/20/2008   GANGLION CYST 10/04/2007   Mixed incontinence 04/28/2007   Hyperlipidemia 04/27/2007   Depression with anxiety 04/27/2007   Asthma, mild intermittent 04/27/2007   INSOMNIA 04/27/2007   ADENOMATOUS COLONIC POLYP 11/04/2006   Hypothyroidism 09/02/2006   Atrial fibrillation, chronic ([place]) 08/05/2006   Past  Medical History:  Diagnosis Date   Allergic rhinitis    Alopecia 2/2 beta blockers    Anemia    Arthritis    Atrial fibrillation -persistent cardiologist-  dr klein/  primary EP -- dr Tawanna Sat ([facility])   a. s/p PVI [name] 2010;  b. on tikosyn/coumadin;  c. 05/2009 Echo: EF 60-65%, Gr 2 DD. (first dx 09/ 2007)   Bilateral lower extremity edema    Bleeding hemorrhoid    Carotid stenosis    mild (hosp 3/11)- consult by vasc/ Dr Donnetta Hutching   Complication of anesthesia    hard to wake   Diverticulosis of colon    Dyspnea    on exertion-climbing stairs   Fatty liver    H/O cardiac radiofrequency ablation    01/ 2008 at [place] /  03/ 2010  at [facility]  Heart failure with preserved ejection fraction ([place])    History of adenomatous polyp of colon    tubular adenoma's   History of cardiomyopathy    secondary tachycardia-induced cardiomyopathy -- resolved 2014   History of squamous cell carcinoma in situ (SCCIS) of skin    05/ 2017  nasal bridge and right medial knee   History of transient ischemic attack (TIA)    01-24-2005 and 06-12-2009   Hyperlipidemia    Hypothyroidism    Mild intermittent asthma    reacts to cats   Mixed stress and urge urinary incontinence    Presence of permanent cardiac pacemaker    was put in 07/2019   Pulmonary nodule    S/P AV nodal ablation 07/28/19 07/29/2019   S/P mitral valve repair 10-23-1998  dr Boyce Medici at [facility]   for MVP and regurg. (annuloplasty ring procedure)   S/P placement of cardiac pacemaker MDT 07/28/19 07/29/2019   Past Surgical History:  Procedure Laterality Date   APPENDECTOMY  1978   AV NODE ABLATION N/A 07/28/2019   Procedure: AV NODE ABLATION;  Surgeon: Deboraha Sprang, MD;  Location: [place] CV LAB;  Service: Cardiovascular;  Laterality: N/A;   BUBBLE STUDY  06/19/2019   Procedure: BUBBLE STUDY;  Surgeon: Pixie Casino, MD;  Location: [facility] ENDOSCOPY;  Service: Cardiovascular;;   CARDIAC ELECTROPHYSIOLOGY [place]  01/ 2008    at [place]   right-sided ablation atrial flutter   [place]  03/ 2010   dr Jaymes Graff at [facility]   AV node ablation and pulmonary vein isolation for atrial fib   CARDIOVERSION  06-18-2006;  07-13-2006;  10-19-2010;  10-27-2010   COLONOSCOPY     COLONOSCOPY WITH PROPOFOL N/A 10/13/2017   Procedure: COLONOSCOPY WITH PROPOFOL;  Surgeon: [name], MD;  Location: [facility] ENDOSCOPY;  Service: Gastroenterology;  Laterality: N/A;   COLONOSCOPY WITH PROPOFOL N/A 02/20/2020   Procedure: COLONOSCOPY WITH PROPOFOL;  Surgeon: Lesly Rubenstein, MD;  Location: [facility] ENDOSCOPY;  Service: Endoscopy;  Laterality: N/A;   CYSTO/ TRANSURETHRAL COLLAGEN INJECTION THERAPY  07-26-2007   dr Matilde Sprang   DILATION AND CURETTAGE OF UTERUS     ESOPHAGOGASTRODUODENOSCOPY (EGD) WITH PROPOFOL N/A 10/13/2017   Procedure: ESOPHAGOGASTRODUODENOSCOPY (EGD) WITH PROPOFOL;  Surgeon: [name], MD;  Location: [facility] ENDOSCOPY;  Service: Gastroenterology;  Laterality: N/A;   EVALUATION UNDER ANESTHESIA WITH HEMORRHOIDECTOMY N/A 04/09/2020   Procedure: EXAM UNDER ANESTHESIA WITH HEMORRHOIDECTOMY;  Surgeon: Jules Husbands, MD;  Location: [facility] ORS;  Service: General;  Laterality: N/A;   EXCISIONAL HEMORRHOIDECTOMY  1980s   GIVENS CAPSULE STUDY N/A 12/08/2017   Procedure: GIVENS CAPSULE STUDY;  Surgeon: [name], MD;  Location: [facility] ENDOSCOPY;  Service: Gastroenterology;  Laterality: N/A;   HEMORRHOID SURGERY N/A 10/29/2016   Procedure: HEMORRHOIDECTOMY;  Surgeon: Leighton Ruff, MD;  Location: [facility];  Service: General;  Laterality: N/A;   MITRAL VALVE ANNULOPLASTY  10/23/1998   "Model 4625; [employer]ner [id number]"; size 28mm; [facility]; Dr. Boyce Medici   PACEMAKER IMPLANT N/A 07/28/2019   Procedure: PACEMAKER IMPLANT;  Surgeon: Deboraha Sprang, MD;  Location: [place] CV LAB;  Service: Cardiovascular;  Laterality: N/A;   [place]   TEE WITH CARDIOVERSION  05-06-2006 at [facility];  01-02-2013 at [facility]   TEE WITHOUT CARDIOVERSION N/A 06/19/2019   Procedure: TRANSESOPHAGEAL ECHOCARDIOGRAM (TEE);  Surgeon: Pixie Casino, MD;  Location: [place];  Service: Cardiovascular;  Laterality: N/A;  TOTAL HIP ARTHROPLASTY Left 05/04/2017   Procedure: LEFT TOTAL HIP ARTHROPLASTY ANTERIOR APPROACH;  Surgeon: Mcarthur Rossetti, MD;  Location: [place];  Service: Orthopedics;  Laterality: Left;   TRANSTHORACIC ECHOCARDIOGRAM  05-01-2015   dr Caryl Comes   ef 50-55%/  mild AV sclerosis without stenosis/  post MV repair with mild central MR (valve area by pressure half-time 2cm^2,  valve area by continutity equation 0.91cm^2, peak grandiant 22mmHg)/  severe LAE/ mild TR/ mild RAE    TUBAL LIGATION Bilateral 1978   Social History   Tobacco Use   Smoking status: Never   Smokeless tobacco: Never  Vaping Use   Vaping Use: Never used  Substance Use Topics   Alcohol use: Yes    Alcohol/week: 0.0 standard drinks    Comment: seldom   Drug use: No   Family History  Problem Relation Age of Onset   Lung cancer Father        smoker, died at 30   Alcohol abuse Father    Cancer Father        bladder and lung CA smoker   Sudden death Other    Breast cancer Neg Hx    Stroke Neg Hx    Allergies  Allergen Reactions   Amiodarone Swelling    SWELLING REACTION UNSPECIFIED    Penicillins Hives and Rash    Has patient had a PCN reaction causing immediate rash, facial/tongue/throat swelling, SOB or lightheadedness with hypotension: No Has patient had a PCN reaction causing severe rash involving mucus membranes or skin necrosis: No Has patient had a PCN reaction that required hospitalization:Patient was inpatient when reaction occurred Has patient had a PCN reaction occurring within the last 10 years: No If all of the above answers are "NO", then may proceed with Cephalosporin use   Amiodarone Hcl Swelling    SWELLING REACTION UNSPECIFIED     Statins Rash    REACTION: rash   Current Outpatient Medications on File Prior to Visit  Medication Sig Dispense Refill   acetaminophen (TYLENOL) 500 MG tablet Take 500 mg by mouth every 6 (six) hours as needed for moderate pain.     alendronate (FOSAMAX) 70 MG tablet TAKE 1 TABLET EVERY 7 DAYS. TAKE WITH A FULL GLASS OF WATER ON AN EMPTY STOMACH. 12 tablet 3   Ascorbic Acid (VITAMIN C) 1000 MG tablet Take 1,000 mg by mouth daily.     diclofenac Sodium (VOLTAREN) 1 % GEL Apply 1 application topically 4 (four) times daily as needed (pain).     diphenhydrAMINE-zinc acetate (BENADRYL) cream Apply 1 application topically 3 (three) times daily as needed for itching.     ezetimibe (ZETIA) 10 MG tablet Take 1 tablet (10 mg total) by mouth daily. 90 tablet 3   ferrous sulfate 325 (65 FE) MG EC tablet TAKE 1 TABLET EVERY DAY WITH BREAKFAST 90 tablet 1   fluticasone (FLONASE) 50 MCG/ACT nasal spray Place 1 spray into both nostrils daily as needed for allergies.     furosemide (LASIX) 40 MG tablet Take 40 mg by mouth daily as needed for edema.     hydrOXYzine (ATARAX/VISTARIL) 25 MG tablet Take 1 tablet (25 mg total) by mouth every 6 (six) hours as needed for itching. 30 tablet 0   Ketotifen Fumarate (EYE ITCH RELIEF OP) Place 1 drop into both eyes daily as needed (allergies).     levothyroxine (SYNTHROID) 75 MCG tablet Take 1 tablet (75 mcg total) by mouth daily before breakfast. 90 tablet 1  Polyethyl Glycol-Propyl Glycol 0.4-0.3 % SOLN Place 1-2 drops into both eyes 3 (three) times daily as needed (for dry eyes.).     warfarin (COUMADIN) 5 MG tablet TAKE 1 TABLET DAILY EXCEPT 1/2 TABLET ON MONDAYS AND FRIDAYS OR AS DIRECTED BY COUMADIN CLINIC 90 tablet 0   No current facility-administered medications on file prior to visit.    Review of Systems  Constitutional:  Negative for activity change, appetite change, fatigue, fever and unexpected weight change.  HENT:  Negative for congestion, ear pain,  rhinorrhea, sinus pressure and sore throat.   Eyes:  Negative for pain, redness and visual disturbance.  Respiratory:  Negative for cough, shortness of breath and wheezing.   Cardiovascular:  Negative for chest pain and palpitations.  Gastrointestinal:  Negative for abdominal pain, blood in stool, constipation and diarrhea.  Endocrine: Negative for polydipsia and polyuria.  Genitourinary:  Negative for dysuria, frequency and urgency.  Musculoskeletal:  Negative for arthralgias, back pain and myalgias.  Skin:  Negative for pallor and rash.  Allergic/Immunologic: Negative for environmental allergies.  Neurological:  Positive for dizziness and headaches. Negative for tremors, seizures, syncope, facial asymmetry, speech difficulty, weakness, light-headedness and numbness.  Hematological:  Negative for adenopathy. Does not bruise/bleed easily.  Psychiatric/Behavioral:  Negative for behavioral problems, confusion, decreased concentration and dysphoric mood. The patient is not nervous/anxious.        Grief       Objective:   Physical Exam Constitutional:      General: She is not in acute distress.    Appearance: Normal appearance. She is normal weight. She is not ill-appearing or diaphoretic.  HENT:     Head: Normocephalic and atraumatic.  Eyes:     General:        Right eye: No discharge.        Left eye: No discharge.     Extraocular Movements: Extraocular movements intact.     Conjunctiva/sclera: Conjunctivae normal.     Pupils: Pupils are equal, round, and reactive to light.  Neck:     Vascular: No carotid bruit.  Cardiovascular:     Rate and Rhythm: Normal rate. Rhythm irregular.     Heart sounds: Normal heart sounds.  Pulmonary:     Effort: Pulmonary effort is normal. No respiratory distress.     Breath sounds: Normal breath sounds. No wheezing.  Musculoskeletal:     Cervical back: Normal range of motion and neck supple.     Right lower leg: No edema.     Left lower leg: No  edema.  Lymphadenopathy:     Cervical: No cervical adenopathy.  Skin:    Coloration: Skin is not pale.     Findings: No bruising or erythema.  Neurological:     Mental Status: She is alert.     Cranial Nerves: No cranial nerve deficit.     Motor: No weakness.     Coordination: Coordination normal.     Gait: Gait normal.     Deep Tendon Reflexes: Reflexes normal.  Psychiatric:        Attention and Perception: Attention normal.        Mood and Affect: Mood is anxious.        Speech: Speech is tangential.        Behavior: Behavior normal.     Comments: Mildly anxious and pleasant Tangential in thought   Seems down when discussing her son but not tearful          Assessment &  Plan:   Problem List Items Addressed This Visit       Other   Dizzy - Primary    Pt experiences episodes of dizziness, brain fog and head pressure (not pain) that she relates to "radiation exposure from electric appliances"  Started with induction range and now happens as well with most electric appliances  She does have a pacemaker and states she mentioned this to her cardiologist She saw neurology and had a nl exam (she was dis satisfied)  We discussed stress and grief reaction, but she adamantly denies this is the cause  Reassuring exam  I told her I have never heard of such an occurrence and that these sort of neurologic symptoms may be part of something age related or otherwise Reviewed neuro notes, ER notes and last CT head as well as MRI  She desires another specialist opinion about her symptoms- was ref to neurology at [facility] for eval and tx  Unsure if this could be mood related or possibly some sort of conversion disorder but pt disagrees No hallucinations noted and no SI Will continue to follow       Relevant Orders   Ambulatory referral to Neurology   Grief reaction    I do wonder if grief reaction from loosing her son and DIL could fuel the neuro symptoms She does not think so   Declines counseling or medication at this time      Relevant Orders   Ambulatory referral to Neurology   Brain fog   Relevant Orders   Ambulatory referral to Neurology   Pressure in head   Relevant Orders   Ambulatory referral to Neurology

## 2021-01-14 NOTE — Assessment & Plan Note (Signed)
I do wonder if grief reaction from loosing her son and DIL could fuel the neuro symptoms She does not think so  Declines counseling or medication at this time

## 2021-01-14 NOTE — Assessment & Plan Note (Signed)
Pt experiences episodes of dizziness, brain fog and head pressure (not pain) that she relates to "radiation exposure from electric appliances"  Started with induction range and now happens as well with most electric appliances  She does have a pacemaker and states she mentioned this to her cardiologist She saw neurology and had a nl exam (she was dis satisfied)  We discussed stress and grief reaction, but she adamantly denies this is the cause  Reassuring exam  I told her I have never heard of such an occurrence and that these sort of neurologic symptoms may be part of something age related or otherwise Reviewed neuro notes, ER notes and last CT head as well as MRI  She desires another specialist opinion about her symptoms- was ref to neurology at [facility] for eval and tx  Unsure if this could be mood related or possibly some sort of conversion disorder but pt disagrees No hallucinations noted and no SI Will continue to follow

## 2021-01-14 NOTE — Patient Instructions (Signed)
I can refer you for a 2nd opinion from neurology- from a tertiary care center  I will start working on a neurology referral for [place]     If your grief [name] gets worse-please let me know Some counseling an medication may help  Take care of yourself  Stay active/physically and mentally

## 2021-01-17 ENCOUNTER — Other Ambulatory Visit: Payer: Self-pay

## 2021-01-17 ENCOUNTER — Ambulatory Visit (INDEPENDENT_AMBULATORY_CARE_PROVIDER_SITE_OTHER): Payer: Medicare Other

## 2021-01-17 ENCOUNTER — Ambulatory Visit: Payer: Medicare Other

## 2021-01-17 DIAGNOSIS — Z7901 Long term (current) use of anticoagulants: Secondary | ICD-10-CM | POA: Diagnosis not present

## 2021-01-17 LAB — POCT INR: INR: 2.7 (ref 2.0–3.0)

## 2021-01-17 NOTE — Patient Instructions (Addendum)
Pre visit review using our clinic review tool, if applicable. No additional management support is needed unless otherwise documented below in the visit note.  Continue 1 tablet daily except take 1 1/2 tablets on Wednesdays. Recheck in 3 wks.

## 2021-01-24 ENCOUNTER — Ambulatory Visit (INDEPENDENT_AMBULATORY_CARE_PROVIDER_SITE_OTHER): Payer: Medicare Other

## 2021-01-24 DIAGNOSIS — I4430 Unspecified atrioventricular block: Secondary | ICD-10-CM

## 2021-01-24 LAB — CUP PACEART REMOTE DEVICE CHECK
Battery Remaining Longevity: 119 mo
Battery Voltage: 3.03 V
Brady Statistic AP VP Percent: 0 %
Brady Statistic AP VS Percent: 0 %
Brady Statistic AS VP Percent: 97.8 %
Brady Statistic AS VS Percent: 2.2 %
Brady Statistic RA Percent Paced: 0 %
Brady Statistic RV Percent Paced: 97.8 %
Date Time Interrogation Session: 20221103215354
Implantable Lead Implant Date: 20210507
Implantable Lead Location: 753860
Implantable Lead Model: 5076
Implantable Pulse Generator Implant Date: 20210507
Lead Channel Impedance Value: 3306 Ohm
Lead Channel Impedance Value: 3306 Ohm
Lead Channel Impedance Value: 399 Ohm
Lead Channel Impedance Value: 437 Ohm
Lead Channel Pacing Threshold Amplitude: 0.625 V
Lead Channel Pacing Threshold Pulse Width: 0.4 ms
Lead Channel Sensing Intrinsic Amplitude: 1.875 mV
Lead Channel Sensing Intrinsic Amplitude: 11 mV
Lead Channel Sensing Intrinsic Amplitude: 11 mV
Lead Channel Setting Pacing Amplitude: 2.5 V
Lead Channel Setting Pacing Pulse Width: 0.4 ms
Lead Channel Setting Sensing Sensitivity: 4 mV

## 2021-01-29 NOTE — Progress Notes (Signed)
Remote pacemaker transmission.   

## 2021-02-03 DIAGNOSIS — H903 Sensorineural hearing loss, bilateral: Secondary | ICD-10-CM | POA: Diagnosis not present

## 2021-02-03 DIAGNOSIS — H9201 Otalgia, right ear: Secondary | ICD-10-CM | POA: Diagnosis not present

## 2021-02-05 DIAGNOSIS — H269 Unspecified cataract: Secondary | ICD-10-CM | POA: Diagnosis not present

## 2021-02-07 ENCOUNTER — Telehealth: Payer: Self-pay

## 2021-02-07 ENCOUNTER — Ambulatory Visit: Payer: Medicare Other

## 2021-02-07 NOTE — Telephone Encounter (Signed)
Pt NS her coumadin clinic apt today. LVM for pt to call to RS.

## 2021-02-10 NOTE — Telephone Encounter (Signed)
RS pt for 12/2 due to no coumadin clinic this Friday due to Thanksgiving.

## 2021-02-21 ENCOUNTER — Other Ambulatory Visit: Payer: Self-pay

## 2021-02-21 ENCOUNTER — Ambulatory Visit (INDEPENDENT_AMBULATORY_CARE_PROVIDER_SITE_OTHER): Payer: Medicare Other

## 2021-02-21 DIAGNOSIS — I4891 Unspecified atrial fibrillation: Secondary | ICD-10-CM

## 2021-02-21 DIAGNOSIS — Z7901 Long term (current) use of anticoagulants: Secondary | ICD-10-CM

## 2021-02-21 DIAGNOSIS — Z5181 Encounter for therapeutic drug level monitoring: Secondary | ICD-10-CM

## 2021-02-21 LAB — POCT INR: INR: 2.5 (ref 2.0–3.0)

## 2021-02-21 NOTE — Patient Instructions (Addendum)
Pre visit review using our clinic review tool, if applicable. No additional management support is needed unless otherwise documented below in the visit note.  Continue 1 tablet daily except take 1 1/2 tablets on Wednesdays. Recheck in 5 wks.

## 2021-02-21 NOTE — Progress Notes (Signed)
Continue 1 tablet daily except take 1 1/2 tablets on Wednesdays. Recheck in 5 wks.

## 2021-02-24 ENCOUNTER — Other Ambulatory Visit: Payer: Self-pay

## 2021-02-24 ENCOUNTER — Ambulatory Visit
Admission: RE | Admit: 2021-02-24 | Discharge: 2021-02-24 | Disposition: A | Discharge status: Home / Self Care | Payer: Medicare Other | Source: Ambulatory Visit | Attending: Obstetrics and Gynecology | Admitting: Obstetrics and Gynecology

## 2021-02-24 DIAGNOSIS — N83202 Unspecified ovarian cyst, left side: Secondary | ICD-10-CM | POA: Insufficient documentation

## 2021-02-24 DIAGNOSIS — D259 Leiomyoma of uterus, unspecified: Secondary | ICD-10-CM | POA: Diagnosis not present

## 2021-02-24 DIAGNOSIS — N858 Other specified noninflammatory disorders of uterus: Secondary | ICD-10-CM | POA: Diagnosis not present

## 2021-02-26 ENCOUNTER — Ambulatory Visit (INDEPENDENT_AMBULATORY_CARE_PROVIDER_SITE_OTHER): Payer: Medicare Other | Admitting: Obstetrics and Gynecology

## 2021-02-26 ENCOUNTER — Other Ambulatory Visit: Payer: Self-pay

## 2021-02-26 ENCOUNTER — Encounter: Payer: Self-pay | Admitting: Obstetrics and Gynecology

## 2021-02-26 VITALS — BP 130/82 | Ht 64.0 in | Wt 159.0 lb

## 2021-02-26 DIAGNOSIS — N83202 Unspecified ovarian cyst, left side: Secondary | ICD-10-CM | POA: Diagnosis not present

## 2021-03-02 ENCOUNTER — Encounter: Payer: Self-pay | Admitting: Emergency Medicine

## 2021-03-02 ENCOUNTER — Other Ambulatory Visit: Payer: Self-pay

## 2021-03-02 ENCOUNTER — Emergency Department
Admission: EM | Admit: 2021-03-02 | Discharge: 2021-03-02 | Disposition: A | Discharge status: Home / Self Care | Payer: Medicare Other | Attending: Emergency Medicine | Admitting: Emergency Medicine

## 2021-03-02 ENCOUNTER — Emergency Department: Payer: Medicare Other

## 2021-03-02 DIAGNOSIS — Z96642 Presence of left artificial hip joint: Secondary | ICD-10-CM | POA: Insufficient documentation

## 2021-03-02 DIAGNOSIS — J4520 Mild intermittent asthma, uncomplicated: Secondary | ICD-10-CM | POA: Diagnosis not present

## 2021-03-02 DIAGNOSIS — H538 Other visual disturbances: Secondary | ICD-10-CM | POA: Insufficient documentation

## 2021-03-02 DIAGNOSIS — Z79899 Other long term (current) drug therapy: Secondary | ICD-10-CM | POA: Insufficient documentation

## 2021-03-02 DIAGNOSIS — E039 Hypothyroidism, unspecified: Secondary | ICD-10-CM | POA: Diagnosis not present

## 2021-03-02 DIAGNOSIS — Z7901 Long term (current) use of anticoagulants: Secondary | ICD-10-CM | POA: Insufficient documentation

## 2021-03-02 DIAGNOSIS — R519 Headache, unspecified: Secondary | ICD-10-CM | POA: Diagnosis not present

## 2021-03-02 DIAGNOSIS — Z950 Presence of cardiac pacemaker: Secondary | ICD-10-CM | POA: Insufficient documentation

## 2021-03-02 DIAGNOSIS — R4182 Altered mental status, unspecified: Secondary | ICD-10-CM | POA: Diagnosis not present

## 2021-03-02 DIAGNOSIS — R9431 Abnormal electrocardiogram [ECG] [EKG]: Secondary | ICD-10-CM | POA: Diagnosis not present

## 2021-03-02 LAB — DIFFERENTIAL
Abs Immature Granulocytes: 0.02 10*3/uL (ref 0.00–0.07)
Basophils Absolute: 0 10*3/uL (ref 0.0–0.1)
Basophils Relative: 1 %
Eosinophils Absolute: 0.1 10*3/uL (ref 0.0–0.5)
Eosinophils Relative: 2 %
Immature Granulocytes: 0 %
Lymphocytes Relative: 27 %
Lymphs Abs: 1.3 10*3/uL (ref 0.7–4.0)
Monocytes Absolute: 0.4 10*3/uL (ref 0.1–1.0)
Monocytes Relative: 7 %
Neutro Abs: 3.2 10*3/uL (ref 1.7–7.7)
Neutrophils Relative %: 63 %

## 2021-03-02 LAB — COMPREHENSIVE METABOLIC PANEL
ALT: 18 U/L (ref 0–44)
AST: 24 U/L (ref 15–41)
Albumin: 3.9 g/dL (ref 3.5–5.0)
Alkaline Phosphatase: 42 U/L (ref 38–126)
Anion gap: 8 (ref 5–15)
BUN: 24 mg/dL — ABNORMAL HIGH (ref 8–23)
CO2: 27 mmol/L (ref 22–32)
Calcium: 9.3 mg/dL (ref 8.9–10.3)
Chloride: 101 mmol/L (ref 98–111)
Creatinine, Ser: 0.78 mg/dL (ref 0.44–1.00)
GFR, Estimated: >60 mL/min (ref >60)
Glucose, Bld: 170 mg/dL — ABNORMAL HIGH (ref 70–99)
Potassium: 3.7 mmol/L (ref 3.5–5.1)
Sodium: 136 mmol/L (ref 135–145)
Total Bilirubin: 0.7 mg/dL (ref 0.3–1.2)
Total Protein: 7.4 g/dL (ref 6.5–8.1)

## 2021-03-02 LAB — CBC
HCT: 41.3 % (ref 36.0–46.0)
Hemoglobin: 13.6 g/dL (ref 12.0–15.0)
MCH: 28.8 pg (ref 26.0–34.0)
MCHC: 32.9 g/dL (ref 30.0–36.0)
MCV: 87.5 fL (ref 80.0–100.0)
Platelets: 164 10*3/uL (ref 150–400)
RBC: 4.72 MIL/uL (ref 3.87–5.11)
RDW: 13.9 % (ref 11.5–15.5)
WBC: 5.1 10*3/uL (ref 4.0–10.5)
nRBC: 0 % (ref 0.0–0.2)

## 2021-03-02 LAB — PROTIME-INR
INR: 2.6 — ABNORMAL HIGH (ref 0.8–1.2)
Prothrombin Time: 27.7 seconds — ABNORMAL HIGH (ref 11.4–15.2)

## 2021-03-02 LAB — APTT: aPTT: 45 seconds — ABNORMAL HIGH (ref 24–36)

## 2021-03-02 NOTE — ED Triage Notes (Signed)
Pt via POV from home. Pt c/o R sided blurred vision, pt also c/o facial pain that starts in her R temple and travels down her face for the past 3 weeks. Pt states the pain is intermittent but the blurred vision is constant for the past 3 weeks. Pt has hx of a brain bleed. Pt is on Warfarin. Pt is A&OX4 and NAD.

## 2021-03-02 NOTE — ED Provider Notes (Signed)
[facility] Emergency Department Provider Note   ____________________________________________    I have reviewed the triage vital signs and the nursing notes.   HISTORY  Chief Complaint Blurred Vision     HPI [name] is a 87 y.o. female who complains of bilateral blurred vision which is been occurring for over a week.  Patient reports she had her glasses adjusted 2 weeks ago and has been doing well but then last week she noticed that they were blurry again.  She denies pain or redness.  She did have a mild headache which resolved, she is concerned because she has a history of intracranial hemorrhage.  Denies fevers chills or cough, no neck pain.  Has had occasional sharp pains in the right side of her face as well over the last 2 weeks  Past Medical History:  Diagnosis Date   Allergic rhinitis    Alopecia 2/2 beta blockers    Anemia    Arthritis    Atrial fibrillation -persistent cardiologist-  dr klein/  primary EP -- dr Tawanna Sat ([facility])   a. s/p PVI [name] 2010;  b. on tikosyn/coumadin;  c. 05/2009 Echo: EF 60-65%, Gr 2 DD. (first dx 09/ 2007)   Bilateral lower extremity edema    Bleeding hemorrhoid    Carotid stenosis    mild (hosp 3/11)- consult by vasc/ Dr Donnetta Hutching   Complication of anesthesia    hard to wake   Diverticulosis of colon    Dyspnea    on exertion-climbing stairs   Fatty liver    H/O cardiac radiofrequency ablation    01/ 2008 at [facility] /  03/ 2010  at [facility] failure with preserved ejection fraction [facility])    History of adenomatous polyp of colon    tubular adenoma's   History of cardiomyopathy    secondary tachycardia-induced cardiomyopathy -- resolved 2014   History of squamous cell carcinoma in situ (SCCIS) of skin    05/ 2017  nasal bridge and right medial knee   History of transient ischemic attack (TIA)    01-24-2005 and 06-12-2009   Hyperlipidemia    Hypothyroidism    Mild intermittent  asthma    reacts to cats   Mixed stress and urge urinary incontinence    Presence of permanent cardiac pacemaker    was put in 07/2019   Pulmonary nodule    S/P AV nodal ablation 07/28/19 07/29/2019   S/P mitral valve repair 10-23-1998  dr Boyce Medici at [facility]   for MVP and regurg. (annuloplasty ring procedure)   S/P placement of cardiac pacemaker MDT 07/28/19 07/29/2019    Patient Active Problem List   Diagnosis Date Noted   Brain fog 01/14/2021   Pressure in head 01/14/2021   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/20/2020   Post-menopausal bleeding 11/20/2020   Facial paresthesia 10/14/2020   Insect bite 09/20/2020   Grief reaction 09/20/2020   Hearing loss 06/13/2020   Atrial fibrillation ([place]) 04/24/2020   Pelvic pain 03/11/2020   Routine general medical examination at a health care facility 03/11/2020   Elevated glucose 03/03/2020   Rectal bleeding 02/18/2020   History of TIA (transient ischemic attack) 02/18/2020   S/P AV nodal ablation 07/28/19 07/29/2019   S/P placement of cardiac pacemaker MDT 07/28/19 07/29/2019   AV block 07/28/2019   CVA (cerebral vascular accident) ([place]) 06/16/2019   Facial tingling 11/29/2018   Tremor of left hand 11/29/2018   Medicare annual wellness visit,  subsequent 11/24/2018   Dysuria 02/06/2018   Dizzy 02/04/2018   Iron deficiency anemia 10/21/2017   Rapid atrial fibrillation ([place]) 08/19/2017   Constipation 08/02/2017   Numbness and tingling 07/14/2017   Paresthesia 07/14/2017   Unilateral primary osteoarthritis, left hip 05/04/2017   Status post total replacement of left hip 05/04/2017   Hip osteoarthritis 04/27/2017   Long term (current) use of anticoagulants 03/04/2017   Venous stasis dermatitis of both lower extremities 01/08/2017   Impacted cerumen of right ear 11/20/2016   Osteopenia 10/25/2016   Pedal edema 08/26/2016   Varicose veins of both lower extremities 08/26/2016   Estrogen deficiency 08/26/2016   Screening mammogram, encounter  for 08/26/2016   Hemorrhoids 08/26/2016   History of nonmelanoma skin cancer 01/01/2016   Pruritus 10/04/2015   Urticaria 08/22/2014   Hip pain 08/02/2014   Left knee pain 08/02/2014   Chronic cough 05/08/2014   Hematochezia 04/09/2014   Colon cancer screening 08/16/2013   Encounter for therapeutic drug monitoring 04/20/2013   Left ovarian cyst 03/14/2013   Palpitations 04/15/2012   COLONIC POLYPS, ADENOMATOUS, HX OF 09/18/2009   PULMONARY NODULE 12/20/2008   GANGLION CYST 10/04/2007   Mixed incontinence 04/28/2007   Hyperlipidemia 04/27/2007   Depression with anxiety 04/27/2007   Asthma, mild intermittent 04/27/2007   INSOMNIA 04/27/2007   ADENOMATOUS COLONIC POLYP 11/04/2006   Hypothyroidism 09/02/2006   Atrial fibrillation, chronic ([place]) 08/05/2006    Past Surgical History:  Procedure Laterality Date   APPENDECTOMY  1978   AV NODE ABLATION N/A 07/28/2019   Procedure: AV NODE ABLATION;  Surgeon: Deboraha Sprang, MD;  Location: [place] [facility] LAB;  Service: Cardiovascular;  Laterality: N/A;   BUBBLE STUDY  06/19/2019   Procedure: BUBBLE STUDY;  Surgeon: Pixie Casino, MD;  Location: [facility] ENDOSCOPY;  Service: Cardiovascular;;   CARDIAC ELECTROPHYSIOLOGY [place]  01/ 2008    at [place]   right-sided ablation atrial flutter   [place]  03/ 2010   dr Jaymes Graff at [facility]   AV node ablation and pulmonary vein isolation for atrial fib   CARDIOVERSION  06-18-2006;  07-13-2006;  10-19-2010;  10-27-2010   COLONOSCOPY     COLONOSCOPY WITH PROPOFOL N/A 10/13/2017   Procedure: COLONOSCOPY WITH PROPOFOL;  Surgeon: [name], MD;  Location: [facility] ENDOSCOPY;  Service: Gastroenterology;  Laterality: N/A;   COLONOSCOPY WITH PROPOFOL N/A 02/20/2020   Procedure: COLONOSCOPY WITH PROPOFOL;  Surgeon: Lesly Rubenstein, MD;  Location: [facility] ENDOSCOPY;  Service: Endoscopy;  Laterality: N/A;   CYSTO/ TRANSURETHRAL COLLAGEN INJECTION  THERAPY  07-26-2007   dr Matilde Sprang   DILATION AND CURETTAGE OF UTERUS     ESOPHAGOGASTRODUODENOSCOPY (EGD) WITH PROPOFOL N/A 10/13/2017   Procedure: ESOPHAGOGASTRODUODENOSCOPY (EGD) WITH PROPOFOL;  Surgeon: [name], MD;  Location: [facility] ENDOSCOPY;  Service: Gastroenterology;  Laterality: N/A;   EVALUATION UNDER ANESTHESIA WITH HEMORRHOIDECTOMY N/A 04/09/2020   Procedure: EXAM UNDER ANESTHESIA WITH HEMORRHOIDECTOMY;  Surgeon: Jules Husbands, MD;  Location: [facility] ORS;  Service: General;  Laterality: N/A;   EXCISIONAL HEMORRHOIDECTOMY  1980s   GIVENS CAPSULE STUDY N/A 12/08/2017   Procedure: GIVENS CAPSULE STUDY;  Surgeon: [name], MD;  Location: [facility] ENDOSCOPY;  Service: Gastroenterology;  Laterality: N/A;   HEMORRHOID SURGERY N/A 10/29/2016   Procedure: HEMORRHOIDECTOMY;  Surgeon: Leighton Ruff, MD;  Location[facility];  Service: General;  Laterality: N/A;   MITRAL VALVE ANNULOPLASTY  10/23/1998   "Model [id number]; [employer] [id number]"; size 73mm; [facility];  Dr. Boyce Medici   PACEMAKER IMPLANT N/A 07/28/2019   Procedure: PACEMAKER IMPLANT;  Surgeon: Deboraha Sprang, MD;  Location: [facility] CV LAB;  Service: Cardiovascular;  Laterality: N/A;   [place]   TEE WITH CARDIOVERSION  05-06-2006 at [facility];  01-02-2013 at [facility]   TEE WITHOUT CARDIOVERSION N/A 06/19/2019   Procedure: TRANSESOPHAGEAL ECHOCARDIOGRAM (TEE);  Surgeon: Pixie Casino, MD;  Location: [facility] ENDOSCOPY;  Service: Cardiovascular;  Laterality: N/A;   TOTAL HIP ARTHROPLASTY Left 05/04/2017   Procedure: LEFT TOTAL HIP ARTHROPLASTY ANTERIOR APPROACH;  Surgeon: Mcarthur Rossetti, MD;  Location: [place];  Service: Orthopedics;  Laterality: Left;   TRANSTHORACIC ECHOCARDIOGRAM  05-01-2015   dr Caryl Comes   ef 50-55%/  mild AV sclerosis without stenosis/  post MV repair with mild central MR (valve area by pressure half-time 2cm^2,  valve area by continutity equation 0.91cm^2, peak grandiant 52mmHg)/  severe LAE/  mild TR/ mild RAE    TUBAL LIGATION Bilateral 1978    Prior to Admission medications   Medication Sig Start Date End Date Taking? Authorizing Provider  acetaminophen (TYLENOL) 500 MG tablet Take 500 mg by mouth every 6 (six) hours as needed for moderate pain.    [provider]  alendronate (FOSAMAX) 70 MG tablet TAKE 1 TABLET EVERY 7 DAYS. TAKE WITH A FULL GLASS OF WATER ON AN EMPTY STOMACH. 07/30/20   Tower, Wynelle Fanny, MD  Ascorbic Acid (VITAMIN C) 1000 MG tablet Take 1,000 mg by mouth daily.    [provider]  diclofenac Sodium (VOLTAREN) 1 % GEL Apply 1 application topically 4 (four) times daily as needed (pain).    [provider]  diphenhydrAMINE-zinc acetate (BENADRYL) cream Apply 1 application topically 3 (three) times daily as needed for itching.    [provider]  ezetimibe (ZETIA) 10 MG tablet Take 1 tablet (10 mg total) by mouth daily. 09/13/19   Deboraha Sprang, MD  ferrous sulfate 325 (65 FE) MG EC tablet TAKE 1 TABLET EVERY DAY WITH BREAKFAST 09/19/20   Tower, Wynelle Fanny, MD  fluticasone (FLONASE) 50 MCG/ACT nasal spray Place 1 spray into both nostrils daily as needed for allergies.    [provider]  furosemide (LASIX) 40 MG tablet Take 40 mg by mouth daily as needed for edema.    [provider]  hydrOXYzine (ATARAX/VISTARIL) 25 MG tablet Take 1 tablet (25 mg total) by mouth every 6 (six) hours as needed for itching. 12/27/20   Kennith Gain, MD  Ketotifen Fumarate (EYE ITCH RELIEF OP) Place 1 drop into both eyes daily as needed (allergies).    [provider]  levothyroxine (SYNTHROID) 75 MCG tablet Take 1 tablet (75 mcg total) by mouth daily before breakfast. 12/25/20   Tower, Wynelle Fanny, MD  Polyethyl Glycol-Propyl Glycol 0.4-0.3 % SOLN Place 1-2 drops into both eyes 3 (three) times daily as needed (for dry eyes.).    [provider]  warfarin (COUMADIN) 5 MG tablet TAKE 1 TABLET DAILY EXCEPT 1/2 TABLET  ON MONDAYS AND FRIDAYS OR AS DIRECTED BY COUMADIN CLINIC 07/30/20   Tower, Wynelle Fanny, MD     Allergies Amiodarone, Penicillins, Amiodarone hcl, and Statins  Family History  Problem Relation Age of Onset   Lung cancer Father        smoker, died at 26   Alcohol abuse Father    Cancer Father        bladder and lung CA smoker   Sudden death  Other    Breast cancer Neg Hx    Stroke Neg Hx     Social History Social History   Tobacco Use   Smoking status: Never   Smokeless tobacco: Never  Vaping Use   Vaping Use: Never used  Substance Use Topics   Alcohol use: Yes    Alcohol/week: 0.0 standard drinks    Comment: seldom   Drug use: No    Review of Systems  Constitutional: No fever/chills Eyes: As above, ENT: No sore throat. Cardiovascular: Denies chest pain. Respiratory: Denies shortness of breath. Gastrointestinal: No abdominal pain.  No nausea, no vomiting.   Genitourinary: Negative for dysuria. Musculoskeletal: Negative for back pain. Skin: Negative for rash. Neurological: Negative for headaches or weakness   ____________________________________________   PHYSICAL EXAM:  VITAL SIGNS: ED Triage Vitals  Enc Vitals Group     BP 03/02/21 1441 (!) 137/59     Pulse Rate 03/02/21 1441 61     Resp 03/02/21 1441 20     Temp 03/02/21 1441 98.4 F (36.9 C)     Temp Source 03/02/21 1441 Oral     SpO2 03/02/21 1441 96 %     Weight 03/02/21 1445 71.2 kg (157 lb)     Height 03/02/21 1445 1.626 m (5\' 4" )     Head Circumference --      Peak Flow --      Pain Score 03/02/21 1444 3     Pain Loc --      Pain Edu? --      Excl. in [place]? --     Constitutional: Alert and oriented. No acute distress. Pleasant and interactive Eyes: Conjunctivae are normal.  PERRLA, EOMI  Nose: No congestion/rhinnorhea. Mouth/Throat: Mucous membranes are moist.    Cardiovascular: Normal rate, regular rhythm.  Respiratory: Normal respiratory effort.  No retractions.  Musculoskeletal: No  lower extremity tenderness nor edema.  Warm and well perfused Neurologic:  Normal speech and language. No gross focal neurologic deficits are appreciated.  Skin:  Skin is warm, dry and intact. No rash noted. Psychiatric: Mood and affect are normal. Speech and behavior are normal.  ____________________________________________   LABS (all labs ordered are listed, but only abnormal results are displayed)  Labs Reviewed  COMPREHENSIVE METABOLIC PANEL - Abnormal; Notable for the following components:      Result Value   Glucose, Bld 170 (*)    BUN 24 (*)    All other components within normal limits  PROTIME-INR - Abnormal; Notable for the following components:   Prothrombin Time 27.7 (*)    INR 2.6 (*)    All other components within normal limits  APTT - Abnormal; Notable for the following components:   aPTT 45 (*)    All other components within normal limits  CBC  DIFFERENTIAL  I-STAT CREATININE, ED  CBG MONITORING, ED   ____________________________________________  EKG   ____________________________________________  RADIOLOGY  CT head reviewed by me, no acute abnormality ____________________________________________   PROCEDURES  Procedure(s) performed: No  Procedures   Critical Care performed: No ____________________________________________   INITIAL IMPRESSION / ASSESSMENT AND PLAN / ED COURSE  Pertinent labs & imaging results that were available during my care of the patient were reviewed by me and considered in my medical decision making (see chart for details).   Patient well-appearing and in no acute distress, symptoms not consistent with CVA, nor giant cell arteritis  Bilateral mildly blurry vision as noted above which occurred over a week ago.  Patient  has optometry/ophthalmology appointment tomorrow no indication for further imaging at this time.    ____________________________________________   FINAL CLINICAL IMPRESSION(S) / ED  DIAGNOSES  Final diagnoses:  Blurry vision, bilateral        Note:  This document was prepared using Dragon voice recognition software and may include unintentional dictation errors.    Lavonia Drafts, MD 03/02/21 2259

## 2021-03-02 NOTE — ED Notes (Signed)
Pt complains of pain in right head and blurry vision in right eye

## 2021-03-03 ENCOUNTER — Telehealth: Payer: Self-pay | Admitting: Family Medicine

## 2021-03-03 DIAGNOSIS — H5461 Unqualified visual loss, right eye, normal vision left eye: Secondary | ICD-10-CM | POA: Diagnosis not present

## 2021-03-03 NOTE — Chronic Care Management (AMB) (Signed)
  Chronic Care Management   Outreach Note  03/03/2021 Name: [name] MRN: [id number] DOB: 1932-08-26  Referred by: Tower, Wynelle Fanny, MD Reason for referral : No chief complaint on file.   An unsuccessful telephone outreach was attempted today. The patient was referred to the pharmacist for assistance with care management and care coordination.   Follow Up Plan:   Tatjana Dellinger Upstream Scheduler

## 2021-03-04 NOTE — Progress Notes (Signed)
Gynecology Ultrasound Follow Up  Chief Complaint:  Chief Complaint  Patient presents with   Follow-up    TVUS - RM 6     History of Present Illness: Patient is a 87 y.o. female who presents today for ultrasound evaluation of left ovarian cyst  Ultrasound demonstrates the following findgins Adnexa: . 2.6 x 1.6 x 2.2 cm left ovarian simple cyst, stable  Uterus: One small calcified fibroid with endometrial stripe  without focal abnormalities Additional: no free fluid  Review of Systems: Review of Systems  Constitutional: Negative.   Gastrointestinal: Negative.   Genitourinary: Negative.    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 s/p PVI [name] 2010;  b. on tikosyn/coumadin;  c. 05/2009 Echo: EF 60-65%, Gr 2 DD. (first dx 09/ 2007)   Bilateral lower extremity edema    Bleeding hemorrhoid    Carotid stenosis    mild (hosp 3/11)- consult by vasc/ Dr Donnetta Hutching   Complication of anesthesia    hard to wake   Diverticulosis of colon    Dyspnea    on exertion-climbing stairs   Fatty liver    H/O cardiac radiofrequency ablation    01/ 2008 at [facility] /  03/ 2010  at [facility] failure with preserved ejection fraction [facility])    History of adenomatous polyp of colon    tubular adenoma's   History of cardiomyopathy    secondary tachycardia-induced cardiomyopathy -- resolved 2014   History of squamous cell carcinoma in situ (SCCIS) of skin    05/ 2017  nasal bridge and right medial knee   History of transient ischemic attack (TIA)    01-24-2005 and 06-12-2009   Hyperlipidemia    Hypothyroidism    Mild intermittent asthma    reacts to cats   Mixed stress and urge urinary incontinence    Presence of permanent cardiac pacemaker    was put in 07/2019   Pulmonary  nodule    S/P AV nodal ablation 07/28/19 07/29/2019   S/P mitral valve repair 10-23-1998  dr Boyce Medici at [facility]   for MVP and regurg. (annuloplasty ring procedure)   S/P placement of cardiac pacemaker MDT 07/28/19 07/29/2019    Past Surgical History:  Past Surgical History:  Procedure Laterality Date   APPENDECTOMY  1978   AV NODE ABLATION N/A 07/28/2019   Procedure: AV NODE ABLATION;  Surgeon: Deboraha Sprang, MD;  Location: [place] CV LAB;  Service: Cardiovascular;  Laterality: N/A;   BUBBLE STUDY  06/19/2019   Procedure: BUBBLE STUDY;  Surgeon: Pixie Casino, MD;  Location: [facility] ENDOSCOPY;  Service: Cardiovascular;;   CARDIAC ELECTROPHYSIOLOGY [place]  01/ 2008    at [place]   right-sided ablation atrial flutter   [place]  03/ 2010   dr Jaymes Graff at [facility]   AV node ablation and pulmonary vein isolation for atrial fib   CARDIOVERSION  06-18-2006;  07-13-2006;  10-19-2010;  10-27-2010   COLONOSCOPY     COLONOSCOPY WITH PROPOFOL N/A 10/13/2017   Procedure: COLONOSCOPY WITH PROPOFOL;  Surgeon: [name], MD;  Location: [facility] ENDOSCOPY;  Service: Gastroenterology;  Laterality: N/A;   COLONOSCOPY WITH PROPOFOL N/A 02/20/2020   Procedure:  COLONOSCOPY WITH PROPOFOL;  Surgeon: Lesly Rubenstein, MD;  Location: [facility] ENDOSCOPY;  Service: Endoscopy;  Laterality: N/A;   CYSTO/ TRANSURETHRAL COLLAGEN INJECTION THERAPY  07-26-2007   dr Matilde Sprang   DILATION AND CURETTAGE OF UTERUS     ESOPHAGOGASTRODUODENOSCOPY (EGD) WITH PROPOFOL N/A 10/13/2017   Procedure: ESOPHAGOGASTRODUODENOSCOPY (EGD) WITH PROPOFOL;  Surgeon: [name], MD;  Location: [facility] ENDOSCOPY;  Service: Gastroenterology;  Laterality: N/A;   EVALUATION UNDER ANESTHESIA WITH HEMORRHOIDECTOMY N/A 04/09/2020   Procedure: EXAM UNDER ANESTHESIA WITH HEMORRHOIDECTOMY;  Surgeon: Jules Husbands, MD;  Location: [facility] ORS;  Service: General;  Laterality: N/A;   EXCISIONAL  HEMORRHOIDECTOMY  1980s   GIVENS CAPSULE STUDY N/A 12/08/2017   Procedure: GIVENS CAPSULE STUDY;  Surgeon: [name], MD;  Location: [facility] ENDOSCOPY;  Service: Gastroenterology;  Laterality: N/A;   HEMORRHOID SURGERY N/A 10/29/2016   Procedure: HEMORRHOIDECTOMY;  Surgeon: Leighton Ruff, MD;  Location: [facility];  Service: General;  Laterality: N/A;   MITRAL VALVE ANNULOPLASTY  10/23/1998   "Model [id number]; [employer]ner [id number]"; size 59mm; [facility]; Dr. Boyce Medici   PACEMAKER IMPLANT N/A 07/28/2019   Procedure: PACEMAKER IMPLANT;  Surgeon: Deboraha Sprang, MD;  Location: [place] CV LAB;  Service: Cardiovascular;  Laterality: N/A;   [place]   TEE WITH CARDIOVERSION  05-06-2006 at [facility];  01-02-2013 at [facility]   TEE WITHOUT CARDIOVERSION N/A 06/19/2019   Procedure: TRANSESOPHAGEAL ECHOCARDIOGRAM (TEE);  Surgeon: Pixie Casino, MD;  Location: [facility] ENDOSCOPY;  Service: Cardiovascular;  Laterality: N/A;   TOTAL HIP ARTHROPLASTY Left 05/04/2017   Procedure: LEFT TOTAL HIP ARTHROPLASTY ANTERIOR APPROACH;  Surgeon: Mcarthur Rossetti, MD;  Location: [place];  Service: Orthopedics;  Laterality: Left;   TRANSTHORACIC ECHOCARDIOGRAM  05-01-2015   dr Caryl Comes   ef 50-55%/  mild AV sclerosis without stenosis/  post MV repair with mild central MR (valve area by pressure half-time 2cm^2,  valve area by continutity equation 0.91cm^2, peak grandiant 30mmHg)/  severe LAE/ mild TR/ mild RAE    TUBAL LIGATION Bilateral 1978    Gynecologic History:  No LMP recorded. Patient is postmenopausal.  Family History:  Family History  Problem Relation Age of Onset   Lung cancer Father        smoker, died at 55   Alcohol abuse Father    Cancer Father        bladder and lung CA smoker   Sudden death Other    Breast cancer Neg Hx    Stroke Neg Hx     Social History:  Social History   Socioeconomic History   Marital status: Widowed    Spouse name: Not on file   Number of  children: 6   Years of education: Not on file   Highest education level: Not on file  Occupational History   Occupation: realtor    Employer: RETIRED  Tobacco Use   Smoking status: Never   Smokeless tobacco: Never  Vaping Use   Vaping Use: Never used  Substance and Sexual Activity   Alcohol use: Yes    Alcohol/week: 0.0 standard drinks    Comment: seldom   Drug use: No   Sexual activity: Not Currently  Other Topics Concern   Not on file  Social History Narrative   Retired. Daily Caffeine use: 2 daily    Social Determinants of Health   Financial Resource Strain: Low Risk    Difficulty of Paying Living Expenses: Not hard at all  Food Insecurity: No  Food Insecurity   Worried About Charity fundraiser in the Last Year: Never true   Ran Out of Food in the Last Year: Never true  Transportation Needs: No Transportation Needs   Lack of Transportation (Medical): No   Lack of Transportation (Non-Medical): No  Physical Activity: Sufficiently Active   Days of Exercise per Week: 3 days   Minutes of Exercise per Session: 60 min  Stress: No Stress Concern Present   Feeling of Stress : Not at all  Social Connections: Not on file  Intimate Partner Violence: Not At Risk   Fear of Current or Ex-Partner: No   Emotionally Abused: No   Physically Abused: No   Sexually Abused: No    Allergies:  Allergies  Allergen Reactions   Amiodarone Swelling    SWELLING REACTION UNSPECIFIED    Penicillins Hives and Rash    Has patient had a PCN reaction causing immediate rash, facial/tongue/throat swelling, SOB or lightheadedness with hypotension: No Has patient had a PCN reaction causing severe rash involving mucus membranes or skin necrosis: No Has patient had a PCN reaction that required hospitalization:Patient was inpatient when reaction occurred Has patient had a PCN reaction occurring within the last 10 years: No If all of the above answers are "NO", then may proceed with Cephalosporin use    Amiodarone Hcl Swelling    SWELLING REACTION UNSPECIFIED    Statins Rash    REACTION: rash    Medications: Prior to Admission medications   Medication Sig Start Date End Date Taking? Authorizing Provider  acetaminophen (TYLENOL) 500 MG tablet Take 500 mg by mouth every 6 (six) hours as needed for moderate pain.    [provider]  alendronate (FOSAMAX) 70 MG tablet TAKE 1 TABLET EVERY 7 DAYS. TAKE WITH A FULL GLASS OF WATER ON AN EMPTY STOMACH. 07/30/20   Tower, Wynelle Fanny, MD  Ascorbic Acid (VITAMIN C) 1000 MG tablet Take 1,000 mg by mouth daily.    [provider]  diclofenac Sodium (VOLTAREN) 1 % GEL Apply 1 application topically 4 (four) times daily as needed (pain).    [provider]  diphenhydrAMINE-zinc acetate (BENADRYL) cream Apply 1 application topically 3 (three) times daily as needed for itching.    [provider]  ezetimibe (ZETIA) 10 MG tablet Take 1 tablet (10 mg total) by mouth daily. 09/13/19   Deboraha Sprang, MD  ferrous sulfate 325 (65 FE) MG EC tablet TAKE 1 TABLET EVERY DAY WITH BREAKFAST 09/19/20   Tower, Wynelle Fanny, MD  fluticasone (FLONASE) 50 MCG/ACT nasal spray Place 1 spray into both nostrils daily as needed for allergies.    [provider]  furosemide (LASIX) 40 MG tablet Take 40 mg by mouth daily as needed for edema.    [provider]  hydrOXYzine (ATARAX/VISTARIL) 25 MG tablet Take 1 tablet (25 mg total) by mouth every 6 (six) hours as needed for itching. 12/27/20   Kennith Gain, MD  Ketotifen Fumarate (EYE ITCH RELIEF OP) Place 1 drop into both eyes daily as needed (allergies).    [provider]  levothyroxine (SYNTHROID) 75 MCG tablet Take 1 tablet (75 mcg total) by mouth daily before breakfast. 12/25/20   Tower, Wynelle Fanny, MD  Polyethyl Glycol-Propyl Glycol 0.4-0.3 % SOLN Place 1-2 drops into both eyes 3 (three) times daily as needed (for dry eyes.).    [provider]  warfarin  (COUMADIN) 5 MG tablet TAKE 1 TABLET DAILY EXCEPT 1/2 TABLET ON MONDAYS  AND FRIDAYS OR AS DIRECTED BY COUMADIN CLINIC 07/30/20   Tower, Wynelle Fanny, MD    Physical Exam Vitals: Blood pressure 130/82, height 5\' 4"  (6.283 m), weight 159 lb (72.1 kg).  General: NAD HEENT: normocephalic, anicteric Pulmonary: No increased work of breathing Extremities: no edema, erythema, or tenderness Neurologic: Grossly intact, normal gait   US PELVIC COMPLETE WITH TRANSVAGINAL  Result Date: 02/25/2021 CLINICAL DATA:  Follow-up left ovarian cyst. EXAM: TRANSABDOMINAL AND TRANSVAGINAL ULTRASOUND OF PELVIS TECHNIQUE: Both transabdominal and transvaginal ultrasound examinations of the pelvis were performed. Transabdominal technique was performed for global imaging of the pelvis including uterus, ovaries, adnexal regions, and pelvic cul-de-sac. It was necessary to proceed with endovaginal exam following the transabdominal exam to visualize the uterus and ovaries. COMPARISON:  Ultrasound 11/21/2020. FINDINGS: Uterus Measurements: 5.3 x 3.8 x 4.9 cm = volume: 50.3 mL. Echogenic shadowing density again noted the uterus most likely a calcified fibroid. Endometrium Thickness: 3.1 mm.  No focal abnormality visualized. Right ovary Measurements: Not visualized. Left ovary Measurements: 3.8 x 1.6 x 3.5 cm = volume: 11.5 mL. 2.6 x 1.6 x 2.2 cm simple appearing left ovarian cyst again noted. Other findings Trace free pelvic fluid. IMPRESSION: 1. Small calcified uterine density again noted most consistent calcified fibroid. 2. 2.6 x 1.6 x 2.2 cm simple appearing left ovarian cyst again noted. 3.  Trace free pelvic fluid. Electronically Signed   By: Marcello Moores  Register M.D.   On: 02/25/2021 07:31     Assessment: 87 y.o. No obstetric history on file. No problem-specific Assessment & Plan notes found for this encounter.   Plan: Problem List Items Addressed This Visit       Endocrine   Left ovarian cyst - Primary    1) Left ovarian  cyst - stable on repeat imaging.  No interval increase in size, complexity, or other features concerning for malignant neoplasm.  Asymptomatic.  Recommend expectant management.    2) A total of 15 minutes were spent in face-to-face contact with the patient during this encounter with over half of that time devoted to counseling and coordination of care.  3) Return in about 1 year (around 02/26/2022) for annual.    Malachy Mood, MD, [place], [place]

## 2021-03-05 ENCOUNTER — Other Ambulatory Visit: Payer: Self-pay | Admitting: Family Medicine

## 2021-03-05 DIAGNOSIS — Z7901 Long term (current) use of anticoagulants: Secondary | ICD-10-CM

## 2021-03-05 NOTE — Telephone Encounter (Signed)
Pt is compliant with warfarin management. Sent in refill.

## 2021-03-05 NOTE — Telephone Encounter (Signed)
Last filled on 07/30/20 #90 with 0 refill LOV 01/14/21-dizzy issue

## 2021-03-06 NOTE — Progress Notes (Signed)
Subjective:   [name] is a 87 y.o. female who presents for Medicare Annual (Subsequent) preventive examination.  I connected with Matthew Saras today by telephone and verified that I am speaking with the correct person using two identifiers. Location patient: home Location provider: work Persons participating in the virtual visit: patient, Marine scientist.    I discussed the limitations, risks, security and privacy concerns of performing an evaluation and management service by telephone and the availability of in person appointments. I also discussed with the patient that there may be a patient responsible charge related to this service. The patient expressed understanding and verbally consented to this telephonic visit.    Interactive audio and video telecommunications were attempted between this provider and patient, however failed, due to patient having technical difficulties OR patient did not have access to video capability.  We continued and completed visit with audio only.  Some vital signs may be absent or patient reported.   Time Spent with patient on telephone encounter: 20 minutes  Review of Systems     Cardiac Risk Factors include: advanced age (>59men, >25 women);dyslipidemia     Objective:    Today's Vitals   03/10/21 1347  Weight: 159 lb (72.1 kg)  Height: 5\' 4"  (1.626 m)  PainSc: 10-Worst pain ever   Body mass index is 27.29 kg/m.  Advanced Directives 03/10/2021 03/02/2021 04/09/2020 04/05/2020 03/08/2020 02/18/2020 02/18/2020  Does Patient Have a Medical Advance Directive? Yes Yes Yes Yes Yes Yes No  Type of Paramedic of [place];Living will [place];Living will Living will;Healthcare Power of Attorney Living will;Healthcare Power of [place];Living will [place];Living will -  Does patient want to make changes to medical advance directive? Yes (MAU/Ambulatory/Procedural Areas  - Information given) - - - - No - Patient declined -  Copy of [place] in Chart? - - - - No - copy requested No - copy requested -  Would patient like information on creating a medical advance directive? - - - - - - -    Current Medications (verified) Outpatient Encounter Medications as of 03/10/2021  Medication Sig   acetaminophen (TYLENOL) 500 MG tablet Take 500 mg by mouth every 6 (six) hours as needed for moderate pain.   alendronate (FOSAMAX) 70 MG tablet TAKE 1 TABLET EVERY 7 DAYS. TAKE WITH A FULL GLASS OF WATER ON AN EMPTY STOMACH.   Ascorbic Acid (VITAMIN C) 1000 MG tablet Take 1,000 mg by mouth daily.   diclofenac Sodium (VOLTAREN) 1 % GEL Apply 1 application topically 4 (four) times daily as needed (pain).   diphenhydrAMINE-zinc acetate (BENADRYL) cream Apply 1 application topically 3 (three) times daily as needed for itching.   ezetimibe (ZETIA) 10 MG tablet Take 1 tablet (10 mg total) by mouth daily.   ferrous sulfate 325 (65 FE) MG EC tablet TAKE 1 TABLET EVERY DAY WITH BREAKFAST   fluticasone (FLONASE) 50 MCG/ACT nasal spray Place 1 spray into both nostrils daily as needed for allergies.   furosemide (LASIX) 40 MG tablet Take 40 mg by mouth daily as needed for edema.   hydrOXYzine (ATARAX/VISTARIL) 25 MG tablet Take 1 tablet (25 mg total) by mouth every 6 (six) hours as needed for itching.   Ketotifen Fumarate (EYE ITCH RELIEF OP) Place 1 drop into both eyes daily as needed (allergies).   levothyroxine (SYNTHROID) 75 MCG tablet Take 1 tablet (75 mcg total) by mouth daily before breakfast.  Polyethyl Glycol-Propyl Glycol 0.4-0.3 % SOLN Place 1-2 drops into both eyes 3 (three) times daily as needed (for dry eyes.).   warfarin (COUMADIN) 5 MG tablet TAKE 1 TABLET BY MOUTH DAILY EXCEPT TAKE 1 AND 1/2 TABLETS ON WEDNESDAYS OR AS DIRECTED BY ANTICOAGULATION CLINIC   No facility-administered encounter medications on file as of 03/10/2021.    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 s/p PVI [name] 2010;  b. on tikosyn/coumadin;  c. 05/2009 Echo: EF 60-65%, Gr 2 DD. (first dx 09/ 2007)   Bilateral lower extremity edema    Bleeding hemorrhoid    Carotid stenosis    mild (hosp 3/11)- consult by vasc/ Dr Donnetta Hutching   Complication of anesthesia    hard to wake   Diverticulosis of colon    Dyspnea    on exertion-climbing stairs   Fatty liver    H/O cardiac radiofrequency ablation    01/ 2008 at [facility] /  03/ 2010  at [facility] failure with preserved ejection fraction [facility])    History of adenomatous polyp of colon    tubular adenoma's   History of cardiomyopathy    secondary tachycardia-induced cardiomyopathy -- resolved 2014   History of squamous cell carcinoma in situ (SCCIS) of skin    05/ 2017  nasal bridge and right medial knee   History of transient ischemic attack (TIA)    01-24-2005 and 06-12-2009   Hyperlipidemia    Hypothyroidism    Mild intermittent asthma    reacts to cats   Mixed stress and urge urinary incontinence    Presence of permanent cardiac pacemaker    was put in 07/2019   Pulmonary nodule    S/P AV nodal ablation 07/28/19 07/29/2019   S/P mitral valve repair 10-23-1998  dr Boyce Medici at [facility]   for MVP and regurg. (annuloplasty ring procedure)   S/P placement of cardiac pacemaker MDT 07/28/19 07/29/2019   Past Surgical History:  Procedure Laterality Date   APPENDECTOMY  1978   AV NODE ABLATION N/A 07/28/2019   Procedure: AV NODE ABLATION;  Surgeon: Deboraha Sprang, MD;  Location: [place] [facility];  Service: Cardiovascular;  Laterality: N/A;   BUBBLE STUDY  06/19/2019   Procedure: BUBBLE STUDY;  Surgeon: Pixie Casino, MD;   Location: [facility] ENDOSCOPY;  Service: Cardiovascular;;   CARDIAC ELECTROPHYSIOLOGY [place]  01/ 2008    at [place]   right-sided ablation atrial flutter   [place]  03/ 2010   dr Jaymes Graff at [facility]   AV node ablation and pulmonary vein isolation for atrial fib   CARDIOVERSION  06-18-2006;  07-13-2006;  10-19-2010;  10-27-2010   COLONOSCOPY     COLONOSCOPY WITH PROPOFOL N/A 10/13/2017   Procedure: COLONOSCOPY WITH PROPOFOL;  Surgeon: [name], MD;  Location: [facility] ENDOSCOPY;  Service: Gastroenterology;  Laterality: N/A;   COLONOSCOPY WITH PROPOFOL N/A 02/20/2020   Procedure: COLONOSCOPY WITH PROPOFOL;  Surgeon: Lesly Rubenstein, MD;  Location: [facility] ENDOSCOPY;  Service: Endoscopy;  Laterality: N/A;   CYSTO/ TRANSURETHRAL COLLAGEN INJECTION THERAPY  07-26-2007   dr Kent  ESOPHAGOGASTRODUODENOSCOPY (EGD) WITH PROPOFOL N/A 10/13/2017   Procedure: ESOPHAGOGASTRODUODENOSCOPY (EGD) WITH PROPOFOL;  Surgeon: [name], MD;  Location: [facility] ENDOSCOPY;  Service: Gastroenterology;  Laterality: N/A;   EVALUATION UNDER ANESTHESIA WITH HEMORRHOIDECTOMY N/A 04/09/2020   Procedure: EXAM UNDER ANESTHESIA WITH HEMORRHOIDECTOMY;  Surgeon: Jules Husbands, MD;  Location: [facility] ORS;  Service: General;  Laterality: N/A;   EXCISIONAL HEMORRHOIDECTOMY  1980s   GIVENS CAPSULE STUDY N/A 12/08/2017   Procedure: GIVENS CAPSULE STUDY;  Surgeon: [name], MD;  Location: [facility] ENDOSCOPY;  Service: Gastroenterology;  Laterality: N/A;   HEMORRHOID SURGERY N/A 10/29/2016   Procedure: HEMORRHOIDECTOMY;  Surgeon: Leighton Ruff, MD;  Location: [facility];  Service: General;  Laterality: N/A;   MITRAL VALVE ANNULOPLASTY  10/23/1998   "Model 4625; Campbell Lerner 191478"; size 60mm; [facility]; Dr. Boyce Medici   PACEMAKER IMPLANT N/A 07/28/2019   Procedure: PACEMAKER IMPLANT;  Surgeon: Deboraha Sprang, MD;  Location: [place] [facility];  Service: Cardiovascular;  Laterality: N/A;   [place]   TEE WITH CARDIOVERSION  05[facility]06-[facility];  01-02-2013 at [facility]   TEE WITHOUT CARDIOVERSION N/A 06/19/2019   Procedure: TRANSESOPHAGEAL ECHOCARDIOGRAM (TEE);  Surgeon: Pixie Casino, MD;  Location: [facility] ENDOSCOPY;  Service: Cardiovascular;  Laterality: N/A;   TOTAL HIP ARTHROPLASTY Left 05/04/2017   Procedure: LEFT TOTAL HIP ARTHROPLASTY ANTERIOR APPROACH;  Surgeon: Mcarthur Rossetti, MD;  Location: [place];  Service: Orthopedics;  Laterality: Left;   TRANSTHORACIC ECHOCARDIOGRAM  05-01-2015   dr Caryl Comes   ef 50-55%/  mild AV sclerosis without stenosis/  post MV repair with mild central MR (valve area by pressure half-time 2cm^2,  valve area by continutity equation 0.91cm^2, peak grandiant 43mmHg)/  severe LAE/ mild TR/ mild RAE    TUBAL LIGATION Bilateral 1978   Family History  Problem Relation Age of Onset   Lung cancer Father        smoker, died at 46   Alcohol abuse Father    Cancer Father        bladder and lung CA smoker   Sudden death Other    Breast cancer Neg Hx    Stroke Neg Hx    Social History   Socioeconomic History   Marital status: Widowed    Spouse name: Not on file   Number of children: 6   Years of education: Not on file   Highest education level: Not on file  Occupational History   Occupation: realtor    Employer: RETIRED  Tobacco Use   Smoking status: Never   Smokeless tobacco: Never  Vaping Use   Vaping Use: Never used  Substance and Sexual Activity   Alcohol use: Not Currently    Comment: seldom   Drug use: No   Sexual activity: Not Currently  Other Topics Concern   Not on file  Social History Narrative   Retired. Daily Caffeine use: 2 daily    Social Determinants of Health   Financial Resource Strain: Low Risk    Difficulty of Paying Living Expenses: Not hard at all  Food Insecurity: No Food Insecurity   Worried About Charity fundraiser in the  Last Year: Never true   Ran Out of Food in the Last Year: Never true  Transportation Needs: No Transportation Needs   Lack of Transportation (Medical): No   Lack of Transportation (Non-Medical): No  Physical Activity: Inactive   Days of Exercise per Week: 0 days   Minutes of Exercise per  Session: 0 min  Stress: No Stress Concern Present   Feeling of Stress : Only a little  Social Connections: Moderately Integrated   Frequency of Communication with Friends and Family: More than three times a week   Frequency of Social Gatherings with Friends and Family: Three times a week   Attends Religious Services: More than 4 times per year   Active Member of Clubs or Organizations: Yes   Attends Archivist Meetings: More than 4 times per year   Marital Status: Widowed    Tobacco Counseling Counseling given: Not Answered   Clinical Intake:  Pre-visit preparation completed: Yes  Pain : 0-10 Pain Score: 10-Worst pain ever (comes and goes , last for a second, patient states going to the ER) Pain Type: Acute pain Pain Location: Face     BMI - recorded: 27.28 Nutritional Status: BMI 25 -29 Overweight Nutritional Risks: None Diabetes: No  How often do you need to have someone help you when you read instructions, pamphlets, or other written materials from your doctor or pharmacy?: 1 - Never  Diabetic? No  Interpreter Needed?: No  Information entered by :: Orrin Brigham LPN   Activities of Daily Living In your present state of health, do you have any difficulty performing the following activities: 03/10/2021 04/05/2020  Hearing? Tempie Donning  Comment wears hearing aids wears hearing aids  Vision? Y N  Comment wears glasses -  Difficulty concentrating or making decisions? N N  Comment does puzzles -  Walking or climbing stairs? N N  Dressing or bathing? N N  Doing errands, shopping? N N  Preparing Food and eating ? N -  Using the Toilet? N -  In the past six months, have you  accidently leaked urine? Y -  Do you have problems with loss of bowel control? N -  Managing your Medications? N -  Managing your Finances? N -  Housekeeping or managing your Housekeeping? N -  Some recent data might be hidden    Patient Care Team: Tower, Wynelle Fanny, MD as PCP - General Deboraha Sprang, MD as PCP - Cardiology (Cardiology)  Indicate any recent Medical Services you may have received from other than [facility] providers in the past year (date may be approximate).     Assessment:   This is a routine wellness examination for [facility].  Hearing/Vision screen Hearing Screening - Comments:: Wears hearing aids Vision Screening - Comments:: Last exam 02/2021, [place] [facility]  Dietary issues and exercise activities discussed: Current Exercise Habits: The patient does not participate in regular exercise at present   Goals Addressed             This Visit's Progress    Patient Stated       Would like to maintain weight       Depression Screen PHQ 2/9 Scores 03/10/2021 03/08/2020 09/05/2018 09/01/2017 08/26/2016 05/15/2015  PHQ - 2 Score 0 0 0 0 0 0  PHQ- 9 Score - 0 0 0 - -    Fall Risk Fall Risk  03/10/2021 04/15/2020 04/03/2020 03/08/2020 09/05/2018  Falls in the past year? 0 0 0 0 0  Comment - - - - -  Number falls in past yr: 0 0 - 0 -  Injury with Fall? 0 0 - 0 -  Risk for fall due to : No Fall Risks - - Medication side effect -  Follow up Falls prevention discussed - - Falls evaluation completed;Falls prevention discussed -  FALL RISK PREVENTION PERTAINING TO THE HOME:  Any stairs in or around the home? Yes  If so, are there any without handrails? No  Home free of loose throw rugs in walkways, pet beds, electrical cords, etc? Yes  Adequate lighting in your home to reduce risk of falls? Yes   ASSISTIVE DEVICES UTILIZED TO PREVENT FALLS:  Life alert? No  Use of a cane, walker or w/c? No  Grab bars in the bathroom? No  Shower chair or bench in shower? Yes   Elevated toilet seat or a handicapped toilet? Yes   TIMED UP AND GO:  Was the test performed? No , visit completed over the phone.     Cognitive Function: Normal cognitive status assessed by this Nurse Health Advisor. No abnormalities found.   MMSE - Mini Mental State Exam 12/24/2020 03/08/2020 09/05/2018 09/01/2017 08/26/2016  Orientation to time 5 5 5 5 5   Orientation to Place 5 5 5 5 5   Registration 3 3 3 3 3   Attention/ Calculation 2 5 0 0 0  Recall 3 3 3 3 2   Recall-comments - - - - pt was unable to recall 1 of 3 words  Language- name 2 objects 2 - 0 0 0  Language- repeat 1 1 1 1 1   Language- follow 3 step command 3 - 0 3 3  Language- read & follow direction 1 - 0 0 0  Write a sentence 1 - 0 0 0  Copy design 1 - 0 0 0  Total score 27 - 17 20 19         Immunizations Immunization History  Administered Date(s) Administered   Influenza,inj,Quad PF,6+ Mos 01/19/2017   Pneumococcal Conjugate-13 08/26/2016   Pneumococcal Polysaccharide-23 09/01/2017   Td 02/03/2005   Tdap 09/30/2020    TDAP status: Up to date  Flu Vaccine status: Declined, Education has been provided regarding the importance of this vaccine but patient still declined. Advised may receive this vaccine at local pharmacy or Health Dept. Aware to provide a copy of the vaccination record if obtained from local pharmacy or Health Dept. Verbalized acceptance and understanding.  Pneumococcal vaccine status: Up to date  Covid-19 vaccine status: Information provided on how to obtain vaccines.   Qualifies for Shingles Vaccine? Yes   Zostavax completed No   Shingrix Completed?: No.    Education has been provided regarding the importance of this vaccine. Patient has been advised to call insurance company to determine out of pocket expense if they have not yet received this vaccine. Advised may also receive vaccine at local pharmacy or Health Dept. Verbalized acceptance and understanding.  Screening Tests Health  Maintenance  Topic Date Due   Zoster Vaccines- Shingrix (1 of 2) Never done   MAMMOGRAM  04/24/2020   INFLUENZA VACCINE  06/20/2021 (Originally 10/21/2020)   COVID-19 Vaccine (1) 12/06/2024 (Originally 04/30/1933)   TETANUS/TDAP  10/01/2030   Pneumonia Vaccine 70+ Years old  Completed   DEXA SCAN  Completed   HPV VACCINES  Aged Out    Health Maintenance  Health Maintenance Due  Topic Date Due   Zoster Vaccines- Shingrix (1 of 2) Never done   MAMMOGRAM  04/24/2020    Colorectal cancer screening: No longer required.   Mammogram status: Ordered 03/10/21. Pt provided with contact info and advised to call to schedule appt.   Bone Density status: no longer required  Lung Cancer Screening: (Low Dose CT Chest recommended if Age 61-80 years, 30 pack-year currently smoking OR have quit  w/in 15years.) does not qualify.     Additional Screening:  Hepatitis C Screening: does not qualify  Vision Screening: Recommended annual ophthalmology exams for early detection of glaucoma and other disorders of the eye. Is the patient up to date with their annual eye exam?  Yes  Who is the provider or what is the name of the office in which the patient attends annual eye exams? [place] Screening: Recommended annual dental exams for proper oral hygiene  Community Resource Referral / Chronic Care Management: CRR required this visit?  No   CCM required this visit?  No      Plan:     I have personally reviewed and noted the following in the patients chart:   Medical and social history Use of alcohol, tobacco or illicit drugs  Current medications and supplements including opioid prescriptions.  Functional ability and status Nutritional status Physical activity Advanced directives List of other physicians Hospitalizations, surgeries, and ER visits in previous 12 months Vitals Screenings to include cognitive, depression, and falls Referrals and appointments  In  addition, I have reviewed and discussed with patient certain preventive protocols, quality metrics, and best practice recommendations. A written personalized care plan for preventive services as well as general preventive health recommendations were provided to patient.   Due to this being a telephonic visit, the after visit summary with patients personalized plan was offered to patient via mail or my-chart. Patient would like to access on my-chart.    Loma Messing, LPN   [id number]   Nurse Health Advisor  Nurse Notes: none

## 2021-03-07 ENCOUNTER — Telehealth: Payer: Self-pay | Admitting: Family Medicine

## 2021-03-07 NOTE — Chronic Care Management (AMB) (Signed)
°  Chronic Care Management   Outreach Note  03/07/2021 Name: DANELL VAZQUEZ MRN: [id number] DOB: Jul 03, 1932  Referred by: Tower, Wynelle Fanny, MD Reason for referral : No chief complaint on file.   A second unsuccessful telephone outreach was attempted today. The patient was referred to pharmacist for assistance with care management and care coordination.  Follow Up Plan:   Tatjana Dellinger Upstream Scheduler

## 2021-03-10 ENCOUNTER — Ambulatory Visit (INDEPENDENT_AMBULATORY_CARE_PROVIDER_SITE_OTHER): Payer: Medicare Other

## 2021-03-10 VITALS — Ht 64.0 in | Wt 159.0 lb

## 2021-03-10 DIAGNOSIS — Z0000 Encounter for general adult medical examination without abnormal findings: Secondary | ICD-10-CM | POA: Diagnosis not present

## 2021-03-10 NOTE — Patient Instructions (Signed)
[name] , Thank you for taking time to complete your Medicare Wellness Visit. I appreciate your ongoing commitment to your health goals. Please review the following plan we discussed and let me know if I can assist you in the future.   Screening recommendations/referrals: Colonoscopy: no longer required  Mammogram: due, last completed 04/25/19, ordered today someone will call to schedule Bone Density: no longer required  Recommended yearly ophthalmology/optometry visit for glaucoma screening and checkup Recommended yearly dental visit for hygiene and checkup  Vaccinations: Influenza vaccine: Due-May obtain vaccine at our office or your local pharmacy. Pneumococcal vaccine: up to date Tdap vaccine: up to date, completed 09/30/20, due 09/30/21 Shingles vaccine: Discuss with your local pharmacy   Covid-19: newest booster available at your local pharmacy  Advanced directives: Please bring a copy of Living Will and/or [place] for your chart.   Conditions/risks identified: see problem list  Next appointment: Follow up in one year for your annual wellness visit     Preventive Care 65 Years and Older, Female Preventive care refers to lifestyle choices and visits with your health care provider that can promote health and wellness. What does preventive care include? A yearly physical exam. This is also called an annual well check. Dental exams once or twice a year. Routine eye exams. Ask your health care provider how often you should have your eyes checked. Personal lifestyle choices, including: Daily care of your teeth and gums. Regular physical activity. Eating a healthy diet. Avoiding tobacco and drug use. Limiting alcohol use. Practicing safe sex. Taking low-dose aspirin every day. Taking vitamin and mineral supplements as recommended by your health care provider. What happens during an annual well check? The services and screenings done by your health care  provider during your annual well check will depend on your age, overall health, lifestyle risk factors, and family history of disease. Counseling  Your health care provider may ask you questions about your: Alcohol use. Tobacco use. Drug use. Emotional well-being. Home and relationship well-being. Sexual activity. Eating habits. History of falls. Memory and ability to understand (cognition). Work and work Statistician. Reproductive health. Screening  You may have the following tests or measurements: Height, weight, and BMI. Blood pressure. Lipid and cholesterol levels. These may be checked every 5 years, or more frequently if you are over 1 years old. Skin check. Lung cancer screening. You may have this screening every year starting at age 31 if you have a 30-pack-year history of smoking and currently smoke or have quit within the past 15 years. Fecal occult blood test (FOBT) of the stool. You may have this test every year starting at age 33. Flexible sigmoidoscopy or colonoscopy. You may have a sigmoidoscopy every 5 years or a colonoscopy every 10 years starting at age 88. Hepatitis C blood test. Hepatitis B blood test. Sexually transmitted disease (STD) testing. Diabetes screening. This is done by checking your blood sugar (glucose) after you have not eaten for a while (fasting). You may have this done every 1-3 years. Bone density scan. This is done to screen for osteoporosis. You may have this done starting at age 71. Mammogram. This may be done every 1-2 years. Talk to your health care provider about how often you should have regular mammograms. Talk with your health care provider about your test results, treatment options, and if necessary, the need for more tests. Vaccines  Your health care provider may recommend certain vaccines, such as: Influenza vaccine. This is recommended every year.  Tetanus, diphtheria, and acellular pertussis (Tdap, Td) vaccine. You may need a Td  booster every 10 years. Zoster vaccine. You may need this after age 97. Pneumococcal 13-valent conjugate (PCV13) vaccine. One dose is recommended after age 14. Pneumococcal polysaccharide (PPSV23) vaccine. One dose is recommended after age 78. Talk to your health care provider about which screenings and vaccines you need and how often you need them. This information is not intended to replace advice given to you by your health care provider. Make sure you discuss any questions you have with your health care provider. Document Released: 04/05/2015 Document Revised: 11/27/2015 Document Reviewed: 01/08/2015 Elsevier Interactive Patient Education  2017 [place] Prevention in the Home Falls can cause injuries. They can happen to people of all ages. There are many things you can do to make your home safe and to help prevent falls. What can I do on the outside of my home? Regularly fix the edges of walkways and driveways and fix any cracks. Remove anything that might make you trip as you walk through a door, such as a raised step or threshold. Trim any bushes or trees on the path to your home. Use bright outdoor lighting. Clear any walking paths of anything that might make someone trip, such as rocks or tools. Regularly check to see if handrails are loose or broken. Make sure that both sides of any steps have handrails. Any raised decks and porches should have guardrails on the edges. Have any leaves, snow, or ice cleared regularly. Use sand or salt on walking paths during winter. Clean up any spills in your garage right away. This includes oil or grease spills. What can I do in the bathroom? Use night lights. Install grab bars by the toilet and in the tub and shower. Do not use towel bars as grab bars. Use non-skid mats or decals in the tub or shower. If you need to sit down in the shower, use a plastic, non-slip stool. Keep the floor dry. Clean up any water that spills on the floor  as soon as it happens. Remove soap buildup in the tub or shower regularly. Attach bath mats securely with double-sided non-slip rug tape. Do not have throw rugs and other things on the floor that can make you trip. What can I do in the bedroom? Use night lights. Make sure that you have a light by your bed that is easy to reach. Do not use any sheets or blankets that are too big for your bed. They should not hang down onto the floor. Have a firm chair that has side arms. You can use this for support while you get dressed. Do not have throw rugs and other things on the floor that can make you trip. What can I do in the kitchen? Clean up any spills right away. Avoid walking on wet floors. Keep items that you use a lot in easy-to-reach places. If you need to reach something above you, use a strong step stool that has a grab bar. Keep electrical cords out of the way. Do not use floor polish or wax that makes floors slippery. If you must use wax, use non-skid floor wax. Do not have throw rugs and other things on the floor that can make you trip. What can I do with my stairs? Do not leave any items on the stairs. Make sure that there are handrails on both sides of the stairs and use them. Fix handrails that are broken or loose.  Make sure that handrails are as long as the stairways. Check any carpeting to make sure that it is firmly attached to the stairs. Fix any carpet that is loose or worn. Avoid having throw rugs at the top or bottom of the stairs. If you do have throw rugs, attach them to the floor with carpet tape. Make sure that you have a light switch at the top of the stairs and the bottom of the stairs. If you do not have them, ask someone to add them for you. What else can I do to help prevent falls? Wear shoes that: Do not have high heels. Have rubber bottoms. Are comfortable and fit you well. Are closed at the toe. Do not wear sandals. If you use a stepladder: Make sure that it is  fully opened. Do not climb a closed stepladder. Make sure that both sides of the stepladder are locked into place. Ask someone to hold it for you, if possible. Clearly mark and make sure that you can see: Any grab bars or handrails. First and last steps. Where the edge of each step is. Use tools that help you move around (mobility aids) if they are needed. These include: Canes. Walkers. Scooters. Crutches. Turn on the lights when you go into a dark area. Replace any light bulbs as soon as they burn out. Set up your furniture so you have a clear path. Avoid moving your furniture around. If any of your floors are uneven, fix them. If there are any pets around you, be aware of where they are. Review your medicines with your doctor. Some medicines can make you feel dizzy. This can increase your chance of falling. Ask your doctor what other things that you can do to help prevent falls. This information is not intended to replace advice given to you by your health care provider. Make sure you discuss any questions you have with your health care provider. Document Released: 01/03/2009 Document Revised: 08/15/2015 Document Reviewed: 04/13/2014 Elsevier Interactive Patient Education  2017 [employer].

## 2021-03-11 ENCOUNTER — Ambulatory Visit: Payer: Medicare Other

## 2021-03-12 DIAGNOSIS — H269 Unspecified cataract: Secondary | ICD-10-CM | POA: Diagnosis not present

## 2021-03-13 ENCOUNTER — Telehealth: Payer: Self-pay | Admitting: Family Medicine

## 2021-03-13 NOTE — Chronic Care Management (AMB) (Signed)
°  Chronic Care Management   Note  03/13/2021 Name: HILMA STEINHILBER MRN: [id number] DOB: [birth year]  VERNESTINE BRODHEAD is a 88 y.o. year old female who is a primary care patient of Tower, Wynelle Fanny, MD. I reached out to [name] by phone today in response to a referral sent by Ms. Macky Lower Quinonez's PCP, Tower, Wynelle Fanny, MD.   Ms. Kutsch was given information about Chronic Care Management services today including:  CCM service includes personalized support from designated clinical staff supervised by her physician, including individualized plan of care and coordination with other care providers 24/7 contact phone numbers for assistance for urgent and routine care needs. Service will only be billed when office clinical staff spend 20 minutes or more in a month to coordinate care. Only one practitioner may furnish and bill the service in a calendar month. The patient may stop CCM services at any time (effective at the end of the month) by phone call to the office staff.   Patient agreed to services and verbal consent obtained.   Follow up plan:   Tatjana Secretary/administrator

## 2021-03-26 ENCOUNTER — Telehealth: Payer: Self-pay | Admitting: Family Medicine

## 2021-03-26 NOTE — Telephone Encounter (Signed)
You cannot refer to radiology-you can only refer for a test (and I don't know what test to order for her problem) At her visit we discussed a 2nd neurology opinion and I placed a referral for a tertiary care center. Did that happen? Did she go or is it scheduled?

## 2021-03-26 NOTE — Telephone Encounter (Signed)
Patient has called requesting a referral to [facility] to get a higher opinion on her ongoing issues.  Please advise

## 2021-03-27 ENCOUNTER — Encounter: Payer: Self-pay | Admitting: Family Medicine

## 2021-03-27 DIAGNOSIS — R202 Paresthesia of skin: Secondary | ICD-10-CM

## 2021-03-27 DIAGNOSIS — R4189 Other symptoms and signs involving cognitive functions and awareness: Secondary | ICD-10-CM

## 2021-03-27 DIAGNOSIS — R519 Headache, unspecified: Secondary | ICD-10-CM

## 2021-03-27 DIAGNOSIS — R42 Dizziness and giddiness: Secondary | ICD-10-CM

## 2021-03-27 NOTE — Telephone Encounter (Signed)
I sent her a my chart message regarding this
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
## 2021-03-27 NOTE — Telephone Encounter (Signed)
Pt notified of Dr. Marliss Coots comments. Pt is asking that PCP call or figure out what test she needs done from the nuclear medicine dpt. Pt said she is not qualified to call and answer medical questions and see what test we need order or see what specialist she needs to see. She said that she be her PCP doing that because she isn't sure what to tell them or who to ask for. Pt is requesting PCP step in and help her figure out what's going on

## 2021-03-28 ENCOUNTER — Other Ambulatory Visit
Admission: RE | Admit: 2021-03-28 | Discharge: 2021-03-28 | Disposition: A | Discharge status: Home / Self Care | Payer: Medicare Other | Source: Ambulatory Visit | Attending: Ophthalmology | Admitting: Ophthalmology

## 2021-03-28 ENCOUNTER — Ambulatory Visit (INDEPENDENT_AMBULATORY_CARE_PROVIDER_SITE_OTHER): Payer: Medicare Other

## 2021-03-28 ENCOUNTER — Other Ambulatory Visit: Payer: Self-pay

## 2021-03-28 DIAGNOSIS — G4452 New daily persistent headache (NDPH): Secondary | ICD-10-CM | POA: Diagnosis not present

## 2021-03-28 DIAGNOSIS — R519 Headache, unspecified: Secondary | ICD-10-CM | POA: Diagnosis present

## 2021-03-28 DIAGNOSIS — Z7901 Long term (current) use of anticoagulants: Secondary | ICD-10-CM

## 2021-03-28 DIAGNOSIS — G500 Trigeminal neuralgia: Secondary | ICD-10-CM | POA: Diagnosis not present

## 2021-03-28 LAB — CBC
HCT: 37.5 % (ref 36.0–46.0)
Hemoglobin: 12.3 g/dL (ref 12.0–15.0)
MCH: 28.9 pg (ref 26.0–34.0)
MCHC: 32.8 g/dL (ref 30.0–36.0)
MCV: 88.2 fL (ref 80.0–100.0)
Platelets: 151 10*3/uL (ref 150–400)
RBC: 4.25 MIL/uL (ref 3.87–5.11)
RDW: 15 % (ref 11.5–15.5)
WBC: 5 10*3/uL (ref 4.0–10.5)
nRBC: 0 % (ref 0.0–0.2)

## 2021-03-28 LAB — POCT INR: INR: 2.7 (ref 2.0–3.0)

## 2021-03-28 LAB — C-REACTIVE PROTEIN: CRP: 0.8 mg/dL (ref <1.0)

## 2021-03-28 LAB — SEDIMENTATION RATE: Sed Rate: 54 mm/hr — ABNORMAL HIGH (ref 0–30)

## 2021-03-28 NOTE — Telephone Encounter (Signed)
Pt was in this morning for INR check. Discussed your msg with her. She would like another neurology referral to [facility]. Advised pt a msg would be sent to PCP and the office would f/u.

## 2021-03-28 NOTE — Patient Instructions (Addendum)
Pre visit review using our clinic review tool, if applicable. No additional management support is needed unless otherwise documented below in the visit note.  Continue 1 tablet daily except take 1 1/2 tablets on Wednesdays. Recheck in 4 wks.

## 2021-03-28 NOTE — Progress Notes (Signed)
Continue 1 tablet daily except take 1 1/2 tablets on Wednesdays. Recheck in 4 wks.

## 2021-03-31 ENCOUNTER — Encounter: Payer: Self-pay | Admitting: *Deleted

## 2021-03-31 DIAGNOSIS — H25011 Cortical age-related cataract, right eye: Secondary | ICD-10-CM | POA: Diagnosis not present

## 2021-04-01 ENCOUNTER — Telehealth: Payer: Self-pay

## 2021-04-01 ENCOUNTER — Encounter: Payer: Self-pay | Admitting: Ophthalmology

## 2021-04-01 NOTE — Telephone Encounter (Signed)
Pt reported at coumadin clinic apt on 1/6 that she would be having cataract surgery, one on 1/18 and another on 2/1. She said she would see her surgeon on 1/9. Advised pt to ask if she will need to hold coumadin and if so how long.  Contacted pt and she advised the surgeon said she did not have to hold her coumadin for the surgeries. Advised a msg would be sent to her PCP to let her know. Advised if any changes to contact coumadin clinic. Pt has next coumadin clinic apt on 2/3. Also let pt know about the msg in her chart concerning the neurology referral. Pt did reply to the msg. Pt was appreciative and verbalized understanding.

## 2021-04-07 ENCOUNTER — Telehealth: Payer: Self-pay

## 2021-04-07 NOTE — Telephone Encounter (Signed)
The patient states she got a notice in the app that her phone is not compatible with the app. I called [employer] and they are closed for [facility] holiday. I told the patient I will call [employer] for her tomorrow.

## 2021-04-07 NOTE — Discharge Instructions (Signed)

## 2021-04-08 ENCOUNTER — Telehealth: Payer: Self-pay

## 2021-04-08 NOTE — Telephone Encounter (Signed)
LMOVM  and a my-chart message for the patient to call [employer] to get help with her phone monitor app. I also left the device clinic number for the patient to call back as well.

## 2021-04-08 NOTE — Telephone Encounter (Signed)
I spoke with the patient and ordered her a bedside monitor. It she get to her in 7-10 business days. She had several questions that I cannot answer. I told her the nurse will give her a call back.

## 2021-04-09 ENCOUNTER — Other Ambulatory Visit: Payer: Self-pay

## 2021-04-09 ENCOUNTER — Ambulatory Visit: Payer: Medicare Other | Admitting: Anesthesiology

## 2021-04-09 ENCOUNTER — Ambulatory Visit
Admission: RE | Admit: 2021-04-09 | Discharge: 2021-04-09 | Disposition: A | Discharge status: Home / Self Care | Payer: Medicare Other | Attending: Ophthalmology | Admitting: Ophthalmology

## 2021-04-09 ENCOUNTER — Encounter
Admission: RE | Disposition: A | Discharge status: Home / Self Care | Payer: Self-pay | Source: Home / Self Care | Attending: Ophthalmology

## 2021-04-09 ENCOUNTER — Encounter: Payer: Self-pay | Admitting: Ophthalmology

## 2021-04-09 DIAGNOSIS — Z7901 Long term (current) use of anticoagulants: Secondary | ICD-10-CM | POA: Diagnosis not present

## 2021-04-09 DIAGNOSIS — H2511 Age-related nuclear cataract, right eye: Secondary | ICD-10-CM | POA: Insufficient documentation

## 2021-04-09 DIAGNOSIS — J4520 Mild intermittent asthma, uncomplicated: Secondary | ICD-10-CM | POA: Insufficient documentation

## 2021-04-09 DIAGNOSIS — M1990 Unspecified osteoarthritis, unspecified site: Secondary | ICD-10-CM | POA: Insufficient documentation

## 2021-04-09 DIAGNOSIS — E039 Hypothyroidism, unspecified: Secondary | ICD-10-CM | POA: Diagnosis not present

## 2021-04-09 DIAGNOSIS — Z8673 Personal history of transient ischemic attack (TIA), and cerebral infarction without residual deficits: Secondary | ICD-10-CM | POA: Insufficient documentation

## 2021-04-09 DIAGNOSIS — H25811 Combined forms of age-related cataract, right eye: Secondary | ICD-10-CM | POA: Diagnosis not present

## 2021-04-09 DIAGNOSIS — I4891 Unspecified atrial fibrillation: Secondary | ICD-10-CM | POA: Insufficient documentation

## 2021-04-09 HISTORY — PX: CATARACT EXTRACTION W/PHACO: SHX586

## 2021-04-09 SURGERY — PHACOEMULSIFICATION, CATARACT, WITH IOL INSERTION
Anesthesia: Monitor Anesthesia Care | Site: Eye | Laterality: Right

## 2021-04-09 MED ORDER — ARMC OPHTHALMIC DILATING DROPS
1.0000 "application " | OPHTHALMIC | Status: DC | PRN
  Administered 2021-04-09 (×3): 1 via OPHTHALMIC

## 2021-04-09 MED ORDER — SIGHTPATH DOSE#1 BSS IO SOLN
INTRAOCULAR | Status: DC | PRN
  Administered 2021-04-09: 15 mL

## 2021-04-09 MED ORDER — LACTATED RINGERS IV SOLN: INTRAVENOUS | Status: DC

## 2021-04-09 MED ORDER — TETRACAINE HCL 0.5 % OP SOLN
OPHTHALMIC | Status: DC | PRN
  Administered 2021-04-09: 1 [drp] via OPHTHALMIC

## 2021-04-09 MED ORDER — LIDOCAINE HCL (PF) 2 % IJ SOLN
INTRAOCULAR | Status: DC | PRN
  Administered 2021-04-09: 1 mL via INTRAOCULAR

## 2021-04-09 MED ORDER — BRIMONIDINE TARTRATE-TIMOLOL 0.2-0.5 % OP SOLN
OPHTHALMIC | Status: DC | PRN
  Administered 2021-04-09: 1 [drp] via OPHTHALMIC

## 2021-04-09 MED ORDER — SIGHTPATH DOSE#1 BSS IO SOLN
INTRAOCULAR | Status: DC | PRN
  Administered 2021-04-09: 107 mL via OPHTHALMIC

## 2021-04-09 MED ORDER — MOXIFLOXACIN HCL 0.5 % OP SOLN
OPHTHALMIC | Status: DC | PRN
  Administered 2021-04-09: 0.2 mL via OPHTHALMIC

## 2021-04-09 MED ORDER — MIDAZOLAM HCL 2 MG/2ML IJ SOLN
INTRAMUSCULAR | Status: DC | PRN
  Administered 2021-04-09: 1 mg via INTRAVENOUS

## 2021-04-09 MED ORDER — TETRACAINE HCL 0.5 % OP SOLN
1.0000 [drp] | OPHTHALMIC | Status: DC | PRN
  Administered 2021-04-09 (×3): 1 [drp] via OPHTHALMIC

## 2021-04-09 MED ORDER — SIGHTPATH DOSE#1 NA HYALUR & NA CHOND-NA HYALUR IO KIT
PACK | INTRAOCULAR | Status: DC | PRN
  Administered 2021-04-09: 1 via OPHTHALMIC

## 2021-04-09 MED ORDER — FENTANYL CITRATE (PF) 100 MCG/2ML IJ SOLN
INTRAMUSCULAR | Status: DC | PRN
Start: 2021-04-09 — End: 2021-04-09
  Administered 2021-04-09: 50 ug via INTRAVENOUS

## 2021-04-09 SURGICAL SUPPLY — 11 items
CATARACT SUITE SIGHTPATH (MISCELLANEOUS) ×2 IMPLANT
FEE CATARACT SUITE SIGHTPATH (MISCELLANEOUS) ×1 IMPLANT
GLOVE SRG 8 PF TXTR STRL LF DI (GLOVE) ×1 IMPLANT
GLOVE SURG ENC TEXT LTX SZ7.5 (GLOVE) ×2 IMPLANT
GLOVE SURG UNDER POLY LF SZ8 (GLOVE) ×2
LENS IOL TECNIS EYHANCE 21.5 (Intraocular Lens) ×1 IMPLANT
NDL FILTER BLUNT 18X1 1/2 (NEEDLE) ×1 IMPLANT
NEEDLE FILTER BLUNT 18X 1/2SAF (NEEDLE) ×1
NEEDLE FILTER BLUNT 18X1 1/2 (NEEDLE) ×1 IMPLANT
SYR 3ML LL SCALE MARK (SYRINGE) ×2 IMPLANT
WATER STERILE IRR 250ML POUR (IV SOLUTION) ×2 IMPLANT

## 2021-04-09 NOTE — Anesthesia Procedure Notes (Signed)
Procedure Name: MAC Date/Time: 04/09/2021 1:36 PM Performed by: Cameron Ali, CRNA Pre-anesthesia Checklist: Patient identified, Emergency Drugs available, Suction available, Timeout performed and Patient being monitored Patient Re-evaluated:Patient Re-evaluated prior to induction Oxygen Delivery Method: Nasal cannula Placement Confirmation: positive ETCO2

## 2021-04-09 NOTE — Transfer of Care (Signed)
Immediate Anesthesia Transfer of Care Note  Patient: [name]  Procedure(s) Performed: CATARACT EXTRACTION PHACO AND INTRAOCULAR LENS PLACEMENT (IOC) RIGHT 6.71 01:11.1 (Right: Eye)  Patient Location: PACU  Anesthesia Type: MAC  Level of Consciousness: awake, alert  and patient cooperative  Airway and Oxygen Therapy: Patient Spontanous Breathing and Patient connected to supplemental oxygen  Post-op Assessment: Post-op Vital signs reviewed, Patient's Cardiovascular Status Stable, Respiratory Function Stable, Patent Airway and No signs of Nausea or vomiting  Post-op Vital Signs: Reviewed and stable  Complications: No notable events documented.

## 2021-04-09 NOTE — Telephone Encounter (Signed)
Returned patients phone call. States she spoke to someone yesterday and her questions were answered.

## 2021-04-09 NOTE — Op Note (Signed)
LOCATION:  [place]   PREOPERATIVE DIAGNOSIS:    Nuclear sclerotic cataract right eye. H25.11   POSTOPERATIVE DIAGNOSIS:  Nuclear sclerotic cataract right eye.     PROCEDURE:  Phacoemusification with posterior chamber intraocular lens placement of the right eye   ULTRASOUND TIME: Procedure(s): CATARACT EXTRACTION PHACO AND INTRAOCULAR LENS PLACEMENT (IOC) RIGHT 6.71 01:11.1 (Right)  LENS:   Implant Name Type Inv. Item Serial No. Manufacturer Lot No. LRB No. Used Action  LENS IOL TECNIS EYHANCE 21.5 - [id number] Intraocular Lens LENS IOL TECNIS EYHANCE 21.5 [id number] [employer]  Right 1 Implanted         SURGEON:  Courtney Heys. Lazarus Salines, Primary, and Wyonia Hough, MD   ANESTHESIA:  Topical with tetracaine drops and 2% Xylocaine jelly, augmented with 1% preservative-free intracameral lidocaine.    COMPLICATIONS:  None.   DESCRIPTION OF PROCEDURE:  The patient was identified in the holding room and transported to the operating room and placed in the supine position under the operating microscope.  The right eye was identified as the operative eye and it was prepped and draped in the usual sterile ophthalmic fashion.   A 1 millimeter clear-corneal paracentesis was made at the 12:00 position.  0.5 ml of preservative-free 1% lidocaine was injected into the anterior chamber. The anterior chamber was filled with Viscoat viscoelastic.  A 2.4 millimeter keratome was used to make a near-clear corneal incision at the 9:00 position.  A curvilinear capsulorrhexis was made with a cystotome and capsulorrhexis forceps.  Balanced salt solution was used to hydrodissect and hydrodelineate the nucleus.   Phacoemulsification was then used in stop and chop fashion to remove the lens nucleus and epinucleus.  The remaining cortex was then removed using the irrigation and aspiration handpiece. Provisc was then placed into the capsular bag to distend it for lens placement.  A lens was then  injected into the capsular bag.  The remaining viscoelastic was aspirated.   Wounds were hydrated with balanced salt solution.  The anterior chamber was inflated to a physiologic pressure with balanced salt solution.  No wound leaks were noted. Vigamox 0.15 ml of a 1mg  per ml solution was injected into the anterior chamber for a dose of 0.2 mg of intracameral antibiotic at the completion of the case.   Timolol and Brimonidine drops were applied to the eye.  The patient was taken to the recovery room in stable condition without complications of anesthesia or surgery.  I was present and participated in the entire procedure.  Chantalle Defilippo 04/09/2021, 2:17 PM

## 2021-04-09 NOTE — H&P (Signed)
[facility]   Primary Care Physician:  Tower, Wynelle Fanny, MD Ophthalmologist: Dr. Leandrew Koyanagi  Pre-Procedure History & Physical: HPI:  [name] is a 88 y.o. female here for ophthalmic surgery.   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 s/p PVI [name] 2010;  b. on tikosyn/coumadin;  c. 05/2009 Echo: EF 60-65%, Gr 2 DD. (first dx 09/ 2007)   Bilateral lower extremity edema    Bleeding hemorrhoid    Carotid stenosis    mild (hosp 3/11)- consult by vasc/ Dr Donnetta Hutching   Complication of anesthesia    hard to wake   Diverticulosis of colon    Dyspnea    on exertion-climbing stairs   Fatty liver    H/O cardiac radiofrequency ablation    01/ 2008 at [place] [facility] [place] /  03/ 2010  at [facility] failure with preserved ejection fraction [facility])    History of adenomatous polyp of colon    tubular adenoma's   History of cardiomyopathy    secondary tachycardia-induced cardiomyopathy -- resolved 2014   History of squamous cell carcinoma in situ (SCCIS) of skin    05/ 2017  nasal bridge and right medial knee   History of transient ischemic attack (TIA)    01-24-2005 and 06-12-2009   Hyperlipidemia    Hypothyroidism    Mild intermittent asthma    reacts to cats   Mixed stress and urge urinary incontinence    Presence of permanent cardiac pacemaker    was put in 07/2019   Pulmonary nodule    S/P AV nodal ablation 07/28/19 07/29/2019   S/P mitral valve repair 10-23-1998  dr Boyce Medici at [facility]   for MVP and regurg. (annuloplasty ring procedure)   S/P placement of cardiac pacemaker MDT 07/28/19 07/29/2019    Past Surgical History:  Procedure Laterality Date   APPENDECTOMY  1978   AV NODE ABLATION N/A 07/28/2019   Procedure: AV NODE ABLATION;  Surgeon: Deboraha Sprang, MD;  Location: [place]  CV LAB;  Service: Cardiovascular;  Laterality: N/A;   BUBBLE STUDY  06/19/2019   Procedure: BUBBLE STUDY;  Surgeon: Pixie Casino, MD;  Location: [facility] ENDOSCOPY;  Service: Cardiovascular;;   CARDIAC ELECTROPHYSIOLOGY [place]  01/ 2008    at [place]   right-sided ablation atrial flutter   [place]  03/ 2010   dr Jaymes Graff at [facility]   AV node ablation and pulmonary vein isolation for atrial fib   CARDIOVERSION  06-18-2006;  07-13-2006;  10-19-2010;  10-27-2010   COLONOSCOPY     COLONOSCOPY WITH PROPOFOL N/A 10/13/2017   Procedure: COLONOSCOPY WITH PROPOFOL;  Surgeon: [name], MD;  Location: [facility] ENDOSCOPY;  Service: Gastroenterology;  Laterality: N/A;   COLONOSCOPY WITH PROPOFOL N/A 02/20/2020   Procedure: COLONOSCOPY WITH PROPOFOL;  Surgeon: Lesly Rubenstein, MD;  Location: [facility] ENDOSCOPY;  Service: Endoscopy;  Laterality: N/A;   CYSTO/ TRANSURETHRAL COLLAGEN INJECTION THERAPY  07-26-2007   dr Matilde Sprang   DILATION AND CURETTAGE OF UTERUS     ESOPHAGOGASTRODUODENOSCOPY (EGD) WITH PROPOFOL N/A 10/13/2017   Procedure: ESOPHAGOGASTRODUODENOSCOPY (EGD) WITH PROPOFOL;  Surgeon: [name], MD;  Location: [facility] ENDOSCOPY;  Service: Gastroenterology;  Laterality: N/A;   EVALUATION UNDER ANESTHESIA WITH  HEMORRHOIDECTOMY N/A 04/09/2020   Procedure: EXAM UNDER ANESTHESIA WITH HEMORRHOIDECTOMY;  Surgeon: Jules Husbands, MD;  Location: [facility] ORS;  Service: General;  Laterality: N/A;   EXCISIONAL HEMORRHOIDECTOMY  1980s   GIVENS CAPSULE STUDY N/A 12/08/2017   Procedure: GIVENS CAPSULE STUDY;  Surgeon: [name], MD;  Location: [facility] ENDOSCOPY;  Service: Gastroenterology;  Laterality: N/A;   HEMORRHOID SURGERY N/A 10/29/2016   Procedure: HEMORRHOIDECTOMY;  Surgeon: Leighton Ruff, MD;  Location: [facility];  Service: General;  Laterality: N/A;   MITRAL VALVE ANNULOPLASTY  10/23/1998   "Model 4625; Campbell Lerner [id number]"; size 76mm;  [facility]; Dr. Boyce Medici   PACEMAKER IMPLANT N/A 07/28/2019   Procedure: PACEMAKER IMPLANT;  Surgeon: Deboraha Sprang, MD;  Location: [place] CV LAB;  Service: Cardiovascular;  Laterality: N/A;   [place]   TEE WITH CARDIOVERSION  05-06-2006 at [facility];  01-02-2013 at [facility]   TEE WITHOUT CARDIOVERSION N/A 06/19/2019   Procedure: TRANSESOPHAGEAL ECHOCARDIOGRAM (TEE);  Surgeon: Pixie Casino, MD;  Location: [facility] ENDOSCOPY;  Service: Cardiovascular;  Laterality: N/A;   TOTAL HIP ARTHROPLASTY Left 05/04/2017   Procedure: LEFT TOTAL HIP ARTHROPLASTY ANTERIOR APPROACH;  Surgeon: Mcarthur Rossetti, MD;  Location: [place];  Service: Orthopedics;  Laterality: Left;   TRANSTHORACIC ECHOCARDIOGRAM  05-01-2015   dr Caryl Comes   ef 50-55%/  mild AV sclerosis without stenosis/  post MV repair with mild central MR (valve area by pressure half-time 2cm^2,  valve area by continutity equation 0.91cm^2, peak grandiant 11mmHg)/  severe LAE/ mild TR/ mild RAE    TUBAL LIGATION Bilateral 1978    Prior to Admission medications   Medication Sig Start Date End Date Taking? Authorizing Provider  acetaminophen (TYLENOL) 500 MG tablet Take 500 mg by mouth every 6 (six) hours as needed for moderate pain.   Yes [provider]  albuterol (VENTOLIN HFA) 108 (90 Base) MCG/ACT inhaler Inhale into the lungs every 6 (six) hours as needed for wheezing or shortness of breath.   Yes [provider]  cetirizine (ZYRTEC) 10 MG tablet Take 10 mg by mouth daily as needed for allergies.   Yes [provider]  diphenhydrAMINE-zinc acetate (BENADRYL) cream Apply 1 application topically 3 (three) times daily as needed for itching.   Yes [provider]  ferrous sulfate 325 (65 FE) MG EC tablet TAKE 1 TABLET EVERY DAY WITH BREAKFAST 09/19/20  Yes Tower, Wynelle Fanny, MD  fluticasone (FLONASE) 50 MCG/ACT nasal spray Place 1 spray into both nostrils daily as needed for allergies.   Yes  [provider]  furosemide (LASIX) 40 MG tablet Take 40 mg by mouth daily as needed for edema.   Yes [provider]  hydrOXYzine (ATARAX/VISTARIL) 25 MG tablet Take 1 tablet (25 mg total) by mouth every 6 (six) hours as needed for itching. 12/27/20  Yes Padgett, Rae Halsted, MD  Ketotifen Fumarate (EYE ITCH RELIEF OP) Place 1 drop into both eyes daily as needed (allergies).   Yes [provider]  levothyroxine (SYNTHROID) 75 MCG tablet Take 1 tablet (75 mcg total) by mouth daily before breakfast. 12/25/20  Yes Tower, Wynelle Fanny, MD  Polyethyl Glycol-Propyl Glycol 0.4-0.3 % SOLN Place 1-2 drops into both eyes 3 (three) times daily as needed (for dry eyes.).   Yes [provider]  warfarin (COUMADIN) 5 MG tablet TAKE 1 TABLET BY MOUTH DAILY EXCEPT TAKE 1 AND 1/2 TABLETS ON WEDNESDAYS OR AS DIRECTED BY ANTICOAGULATION CLINIC 03/05/21  Yes Tower,  Wynelle Fanny, MD  alendronate (FOSAMAX) 70 MG tablet TAKE 1 TABLET EVERY 7 DAYS. TAKE WITH A FULL GLASS OF WATER ON AN EMPTY STOMACH. Patient not taking: Reported on 04/01/2021 07/30/20   Tower, Wynelle Fanny, MD  diclofenac Sodium (VOLTAREN) 1 % GEL Apply 1 application topically 4 (four) times daily as needed (pain). Patient not taking: Reported on 04/01/2021    [provider]    Allergies as of 03/06/2021 - Review Complete 03/02/2021  Allergen Reaction Noted   Amiodarone Swelling 08/27/2015   Penicillins Hives and Rash 04/27/2007   Amiodarone hcl Swelling    Statins Rash 08/27/2015    Family History  Problem Relation Age of Onset   Lung cancer Father        smoker, died at 88   Alcohol abuse Father    Cancer Father        bladder and lung CA smoker   Sudden death Other    Breast cancer Neg Hx    Stroke Neg Hx     Social History   Socioeconomic History   Marital status: Widowed    Spouse name: Not on file   Number of children: 6   Years of education: Not on file   Highest education level: Not on file   Occupational History   Occupation: realtor    Employer: RETIRED  Tobacco Use   Smoking status: Never   Smokeless tobacco: Never  Vaping Use   Vaping Use: Never used  Substance and Sexual Activity   Alcohol use: Not Currently    Comment: seldom   Drug use: No   Sexual activity: Not Currently  Other Topics Concern   Not on file  Social History Narrative   Retired. Daily Caffeine use: 2 daily    Social Determinants of Health   Financial Resource Strain: Low Risk    Difficulty of Paying Living Expenses: Not hard at all  Food Insecurity: No Food Insecurity   Worried About Charity fundraiser in the Last Year: Never true   Ran Out of Food in the Last Year: Never true  Transportation Needs: No Transportation Needs   Lack of Transportation (Medical): No   Lack of Transportation (Non-Medical): No  Physical Activity: Inactive   Days of Exercise per Week: 0 days   Minutes of Exercise per Session: 0 min  Stress: No Stress Concern Present   Feeling of Stress : Only a little  Social Connections: Moderately Integrated   Frequency of Communication with Friends and Family: More than three times a week   Frequency of Social Gatherings with Friends and Family: Three times a week   Attends Religious Services: More than 4 times per year   Active Member of Clubs or Organizations: Yes   Attends Archivist Meetings: More than 4 times per year   Marital Status: Widowed  Human resources officer Violence: Not At Risk   Fear of Current or Ex-Partner: No   Emotionally Abused: No   Physically Abused: No   Sexually Abused: No    Review of Systems: See HPI, otherwise negative ROS  Physical Exam: BP (!) 147/73    Pulse 72    Temp 98.4 F (36.9 C) (Temporal)    Resp 20    Ht 5\' 4"  (1.626 m)    Wt 73.5 kg    SpO2 97%    BMI 27.81 kg/m  General:   Alert,  pleasant and cooperative in NAD Head:  Normocephalic and atraumatic. Lungs:  Clear to  auscultation.    Heart:  Regular rate and rhythm.    Impression/Plan: [name] is here for ophthalmic surgery.  Risks, benefits, limitations, and alternatives regarding ophthalmic surgery have been reviewed with the patient.  Questions have been answered.  All parties agreeable.   Leandrew Koyanagi, MD  04/09/2021, 1:29 PM

## 2021-04-09 NOTE — Anesthesia Preprocedure Evaluation (Signed)
Anesthesia Evaluation  Patient identified by MRN, date of birth, ID band  History of Anesthesia Complications (+) PROLONGED EMERGENCE and history of anesthetic complications  Airway Mallampati: II  TM Distance: >3 FB Neck ROM: Full    Dental no notable dental hx.    Pulmonary asthma ,    Pulmonary exam normal        Cardiovascular Exercise Tolerance: Good Normal cardiovascular exam+ dysrhythmias (on Coumadin) Atrial Fibrillation + pacemaker      Neuro/Psych CVA (TIA)    GI/Hepatic negative GI ROS, Neg liver ROS,   Endo/Other  Hypothyroidism   Renal/GU negative Renal ROS     Musculoskeletal  (+) Arthritis , Osteoarthritis,    Abdominal   Peds  Hematology   Anesthesia Other Findings   Reproductive/Obstetrics                             Anesthesia Physical Anesthesia Plan  ASA: 3  Anesthesia Plan: MAC   Post-op Pain Management: Minimal or no pain anticipated   Induction: Intravenous  PONV Risk Score and Plan: 2 and Treatment may vary due to age or medical condition, Midazolam and TIVA  Airway Management Planned: Nasal Cannula and Natural Airway  Additional Equipment: None  Intra-op Plan:   Post-operative Plan:   Informed Consent: I have reviewed the patients History and Physical, chart, labs and discussed the procedure including the risks, benefits and alternatives for the proposed anesthesia with the patient or authorized representative who has indicated his/her understanding and acceptance.       Plan Discussed with: CRNA  Anesthesia Plan Comments:         Anesthesia Quick Evaluation

## 2021-04-09 NOTE — Anesthesia Postprocedure Evaluation (Signed)
Anesthesia Post Note  Patient: [name]  Procedure(s) Performed: CATARACT EXTRACTION PHACO AND INTRAOCULAR LENS PLACEMENT (IOC) RIGHT 6.71 01:11.1 (Right: Eye)     Patient location during evaluation: PACU Anesthesia Type: MAC Level of consciousness: awake and alert Pain management: pain level controlled Vital Signs Assessment: post-procedure vital signs reviewed and stable Respiratory status: spontaneous breathing Cardiovascular status: blood pressure returned to baseline Postop Assessment: no apparent nausea or vomiting, adequate PO intake and no headache Anesthetic complications: no   No notable events documented.  Adele Barthel Oracio Galen

## 2021-04-10 ENCOUNTER — Encounter: Payer: Self-pay | Admitting: Ophthalmology

## 2021-04-14 ENCOUNTER — Encounter: Payer: Self-pay | Admitting: Ophthalmology

## 2021-04-17 DIAGNOSIS — H2512 Age-related nuclear cataract, left eye: Secondary | ICD-10-CM | POA: Diagnosis not present

## 2021-04-21 NOTE — Discharge Instructions (Signed)

## 2021-04-23 ENCOUNTER — Ambulatory Visit
Admission: RE | Admit: 2021-04-23 | Discharge: 2021-04-23 | Disposition: A | Discharge status: Home / Self Care | Payer: Medicare Other | Attending: Ophthalmology | Admitting: Ophthalmology

## 2021-04-23 ENCOUNTER — Other Ambulatory Visit: Payer: Self-pay

## 2021-04-23 ENCOUNTER — Ambulatory Visit: Payer: Medicare Other | Admitting: Anesthesiology

## 2021-04-23 ENCOUNTER — Encounter
Admission: RE | Disposition: A | Discharge status: Home / Self Care | Payer: Self-pay | Source: Home / Self Care | Attending: Ophthalmology

## 2021-04-23 ENCOUNTER — Encounter: Payer: Self-pay | Admitting: Ophthalmology

## 2021-04-23 DIAGNOSIS — E039 Hypothyroidism, unspecified: Secondary | ICD-10-CM | POA: Diagnosis not present

## 2021-04-23 DIAGNOSIS — F32A Depression, unspecified: Secondary | ICD-10-CM | POA: Insufficient documentation

## 2021-04-23 DIAGNOSIS — H25812 Combined forms of age-related cataract, left eye: Secondary | ICD-10-CM | POA: Diagnosis not present

## 2021-04-23 DIAGNOSIS — H2512 Age-related nuclear cataract, left eye: Secondary | ICD-10-CM | POA: Diagnosis not present

## 2021-04-23 DIAGNOSIS — Z950 Presence of cardiac pacemaker: Secondary | ICD-10-CM | POA: Diagnosis present

## 2021-04-23 DIAGNOSIS — I4891 Unspecified atrial fibrillation: Secondary | ICD-10-CM | POA: Insufficient documentation

## 2021-04-23 DIAGNOSIS — H25012 Cortical age-related cataract, left eye: Secondary | ICD-10-CM | POA: Diagnosis not present

## 2021-04-23 DIAGNOSIS — F419 Anxiety disorder, unspecified: Secondary | ICD-10-CM | POA: Insufficient documentation

## 2021-04-23 DIAGNOSIS — Z8673 Personal history of transient ischemic attack (TIA), and cerebral infarction without residual deficits: Secondary | ICD-10-CM | POA: Diagnosis not present

## 2021-04-23 DIAGNOSIS — J45909 Unspecified asthma, uncomplicated: Secondary | ICD-10-CM | POA: Insufficient documentation

## 2021-04-23 HISTORY — PX: CATARACT EXTRACTION W/PHACO: SHX586

## 2021-04-23 SURGERY — PHACOEMULSIFICATION, CATARACT, WITH IOL INSERTION
Anesthesia: Monitor Anesthesia Care | Site: Eye | Laterality: Left

## 2021-04-23 MED ORDER — SIGHTPATH DOSE#1 BSS IO SOLN
INTRAOCULAR | Status: DC | PRN
  Administered 2021-04-23: 135 mL via OPHTHALMIC

## 2021-04-23 MED ORDER — MOXIFLOXACIN HCL 0.5 % OP SOLN
OPHTHALMIC | Status: DC | PRN
  Administered 2021-04-23: 0.2 mL via OPHTHALMIC

## 2021-04-23 MED ORDER — MIDAZOLAM HCL 2 MG/2ML IJ SOLN
INTRAMUSCULAR | Status: DC | PRN
  Administered 2021-04-23: 1 mg via INTRAVENOUS

## 2021-04-23 MED ORDER — SIGHTPATH DOSE#1 BSS IO SOLN
INTRAOCULAR | Status: DC | PRN
  Administered 2021-04-23: 15 mL

## 2021-04-23 MED ORDER — DEXMEDETOMIDINE (PRECEDEX) IN NS 20 MCG/5ML (4 MCG/ML) IV SYRINGE
PREFILLED_SYRINGE | INTRAVENOUS | Status: DC | PRN
  Administered 2021-04-23: 5 ug via INTRAVENOUS

## 2021-04-23 MED ORDER — ACETAMINOPHEN 160 MG/5ML PO SOLN: 325.0000 mg | ORAL | Status: DC | PRN

## 2021-04-23 MED ORDER — ARMC OPHTHALMIC DILATING DROPS
1.0000 "application " | OPHTHALMIC | Status: DC | PRN
  Administered 2021-04-23 (×3): 1 via OPHTHALMIC

## 2021-04-23 MED ORDER — SIGHTPATH DOSE#1 BSS IO SOLN
INTRAOCULAR | Status: DC | PRN
  Administered 2021-04-23: 1 mL via INTRAMUSCULAR

## 2021-04-23 MED ORDER — SIGHTPATH DOSE#1 NA HYALUR & NA CHOND-NA HYALUR IO KIT
PACK | INTRAOCULAR | Status: DC | PRN
  Administered 2021-04-23: 1 via OPHTHALMIC

## 2021-04-23 MED ORDER — ONDANSETRON HCL 4 MG/2ML IJ SOLN: 4.0000 mg | Freq: Once | INTRAMUSCULAR | Status: DC | PRN

## 2021-04-23 MED ORDER — FENTANYL CITRATE (PF) 100 MCG/2ML IJ SOLN
INTRAMUSCULAR | Status: DC | PRN
  Administered 2021-04-23: 50 ug via INTRAVENOUS

## 2021-04-23 MED ORDER — TETRACAINE HCL 0.5 % OP SOLN
1.0000 [drp] | OPHTHALMIC | Status: DC | PRN
  Administered 2021-04-23 (×3): 1 [drp] via OPHTHALMIC

## 2021-04-23 MED ORDER — ACETAMINOPHEN 325 MG PO TABS: 325.0000 mg | ORAL_TABLET | ORAL | Status: DC | PRN

## 2021-04-23 MED ORDER — BRIMONIDINE TARTRATE-TIMOLOL 0.2-0.5 % OP SOLN
OPHTHALMIC | Status: DC | PRN
  Administered 2021-04-23: 1 [drp] via OPHTHALMIC

## 2021-04-23 SURGICAL SUPPLY — 11 items
CATARACT SUITE SIGHTPATH (MISCELLANEOUS) ×2 IMPLANT
FEE CATARACT SUITE SIGHTPATH (MISCELLANEOUS) ×1 IMPLANT
GLOVE SRG 8 PF TXTR STRL LF DI (GLOVE) ×1 IMPLANT
GLOVE SURG ENC TEXT LTX SZ7.5 (GLOVE) ×2 IMPLANT
GLOVE SURG UNDER POLY LF SZ8 (GLOVE) ×2
LENS IOL TECNIS EYHANCE 24.0 (Intraocular Lens) ×1 IMPLANT
NDL FILTER BLUNT 18X1 1/2 (NEEDLE) ×1 IMPLANT
NEEDLE FILTER BLUNT 18X 1/2SAF (NEEDLE) ×1
NEEDLE FILTER BLUNT 18X1 1/2 (NEEDLE) ×1 IMPLANT
SYR 3ML LL SCALE MARK (SYRINGE) ×2 IMPLANT
WATER STERILE IRR 250ML POUR (IV SOLUTION) ×2 IMPLANT

## 2021-04-23 NOTE — Anesthesia Postprocedure Evaluation (Signed)
Anesthesia Post Note  Patient: [name]  Procedure(s) Performed: CATARACT EXTRACTION PHACO AND INTRAOCULAR LENS PLACEMENT (IOC) LEFT (Left: Eye)     Patient location during evaluation: PACU Anesthesia Type: MAC Level of consciousness: awake and alert Pain management: pain level controlled Vital Signs Assessment: post-procedure vital signs reviewed and stable Respiratory status: spontaneous breathing, nonlabored ventilation, respiratory function stable and patient connected to nasal cannula oxygen Cardiovascular status: stable and blood pressure returned to baseline Postop Assessment: no apparent nausea or vomiting Anesthetic complications: no   No notable events documented.  Sinda Du

## 2021-04-23 NOTE — H&P (Signed)
[facility]   Primary Care Physician:  Tower, Wynelle Fanny, MD Ophthalmologist: Dr. Merleen Nicely, Dr. Leandrew Koyanagi  Pre-Procedure History & Physical: HPI:  [name] is a 88 y.o. female here for ophthalmic surgery.   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 s/p PVI [name] 2010;  b. on tikosyn/coumadin;  c. 05/2009 Echo: EF 60-65%, Gr 2 DD. (first dx 09/ 2007)   Bilateral lower extremity edema    Bleeding hemorrhoid    Carotid stenosis    mild (hosp 3/11)- consult by vasc/ Dr Donnetta Hutching   Complication of anesthesia    hard to wake   Diverticulosis of colon    Dyspnea    on exertion-climbing stairs   Fatty liver    H/O cardiac radiofrequency ablation    01/ 2008 at [place] [facility] [place] /  03/ 2010  at [facility] failure with preserved ejection fraction [facility])    History of adenomatous polyp of colon    tubular adenoma's   History of cardiomyopathy    secondary tachycardia-induced cardiomyopathy -- resolved 2014   History of squamous cell carcinoma in situ (SCCIS) of skin    05/ 2017  nasal bridge and right medial knee   History of transient ischemic attack (TIA)    01-24-2005 and 06-12-2009   Hyperlipidemia    Hypothyroidism    Mild intermittent asthma    reacts to cats   Mixed stress and urge urinary incontinence    Presence of permanent cardiac pacemaker    was put in 07/2019   Pulmonary nodule    S/P AV nodal ablation 07/28/19 07/29/2019   S/P mitral valve repair 10-23-1998  dr Boyce Medici at [facility]   for MVP and regurg. (annuloplasty ring procedure)   S/P placement of cardiac pacemaker MDT 07/28/19 07/29/2019    Past Surgical History:  Procedure Laterality Date   APPENDECTOMY  1978   AV NODE ABLATION N/A 07/28/2019   Procedure: AV NODE ABLATION;  Surgeon: Deboraha Sprang, MD;   Location: [place] CV LAB;  Service: Cardiovascular;  Laterality: N/A;   BUBBLE STUDY  06/19/2019   Procedure: BUBBLE STUDY;  Surgeon: Pixie Casino, MD;  Location: [facility] ENDOSCOPY;  Service: Cardiovascular;;   CARDIAC ELECTROPHYSIOLOGY [place]  01/ 2008    at [place]   right-sided ablation atrial flutter   [place]  03/ 2010   dr Jaymes Graff at [facility]   AV node ablation and pulmonary vein isolation for atrial fib   CARDIOVERSION  06-18-2006;  07-13-2006;  10-19-2010;  10-27-2010   CATARACT EXTRACTION W/PHACO Right 04/09/2021   Procedure: CATARACT EXTRACTION PHACO AND INTRAOCULAR LENS PLACEMENT ([place]) RIGHT 6.71 01:11.1;  Surgeon: Leandrew Koyanagi, MD;  Location: [place];  Service: Ophthalmology;  Laterality: Right;   COLONOSCOPY     COLONOSCOPY WITH PROPOFOL N/A 10/13/2017   Procedure: COLONOSCOPY WITH PROPOFOL;  Surgeon: [name], MD;  Location: [facility] ENDOSCOPY;  Service: Gastroenterology;  Laterality: N/A;   COLONOSCOPY WITH PROPOFOL N/A 02/20/2020   Procedure: COLONOSCOPY WITH PROPOFOL;  Surgeon: Lesly Rubenstein, MD;  Location: [facility] ENDOSCOPY;  Service: Endoscopy;  Laterality: N/A;   CYSTO/ TRANSURETHRAL COLLAGEN INJECTION THERAPY  07-26-2007   dr Matilde Sprang   DILATION AND CURETTAGE  OF UTERUS     ESOPHAGOGASTRODUODENOSCOPY (EGD) WITH PROPOFOL N/A 10/13/2017   Procedure: ESOPHAGOGASTRODUODENOSCOPY (EGD) WITH PROPOFOL;  Surgeon: [name], MD;  Location: [facility] ENDOSCOPY;  Service: Gastroenterology;  Laterality: N/A;   EVALUATION UNDER ANESTHESIA WITH HEMORRHOIDECTOMY N/A 04/09/2020   Procedure: EXAM UNDER ANESTHESIA WITH HEMORRHOIDECTOMY;  Surgeon: Jules Husbands, MD;  Location: [facility] ORS;  Service: General;  Laterality: N/A;   EXCISIONAL HEMORRHOIDECTOMY  1980s   GIVENS CAPSULE STUDY N/A 12/08/2017   Procedure: GIVENS CAPSULE STUDY;  Surgeon: [name], MD;  Location: [facility] ENDOSCOPY;  Service: Gastroenterology;   Laterality: N/A;   HEMORRHOID SURGERY N/A 10/29/2016   Procedure: HEMORRHOIDECTOMY;  Surgeon: Leighton Ruff, MD;  Location: [facility];  Service: General;  Laterality: N/A;   MITRAL VALVE ANNULOPLASTY  10/23/1998   "Model 4625; Campbell Lerner [id number]"; size 45mm; [facility]; Dr. Boyce Medici   PACEMAKER IMPLANT N/A 07/28/2019   Procedure: PACEMAKER IMPLANT;  Surgeon: Deboraha Sprang, MD;  Location: [place] CV LAB;  Service: Cardiovascular;  Laterality: N/A;   [place]   TEE WITH CARDIOVERSION  05-06-2006 at [facility];  01-02-2013 at [facility]   TEE WITHOUT CARDIOVERSION N/A 06/19/2019   Procedure: TRANSESOPHAGEAL ECHOCARDIOGRAM (TEE);  Surgeon: Pixie Casino, MD;  Location: [facility] ENDOSCOPY;  Service: Cardiovascular;  Laterality: N/A;   TOTAL HIP ARTHROPLASTY Left 05/04/2017   Procedure: LEFT TOTAL HIP ARTHROPLASTY ANTERIOR APPROACH;  Surgeon: Mcarthur Rossetti, MD;  Location: [place];  Service: Orthopedics;  Laterality: Left;   TRANSTHORACIC ECHOCARDIOGRAM  05-01-2015   dr Caryl Comes   ef 50-55%/  mild AV sclerosis without stenosis/  post MV repair with mild central MR (valve area by pressure half-time 2cm^2,  valve area by continutity equation 0.91cm^2, peak grandiant 42mmHg)/  severe LAE/ mild TR/ mild RAE    TUBAL LIGATION Bilateral 1978    Prior to Admission medications   Medication Sig Start Date End Date Taking? Authorizing Provider  acetaminophen (TYLENOL) 500 MG tablet Take 500 mg by mouth every 6 (six) hours as needed for moderate pain.   Yes [provider]  albuterol (VENTOLIN HFA) 108 (90 Base) MCG/ACT inhaler Inhale into the lungs every 6 (six) hours as needed for wheezing or shortness of breath.   Yes [provider]  cetirizine (ZYRTEC) 10 MG tablet Take 10 mg by mouth daily as needed for allergies.   Yes [provider]  diphenhydrAMINE-zinc acetate (BENADRYL) cream Apply 1 application topically 3 (three) times daily as needed for  itching.   Yes [provider]  ferrous sulfate 325 (65 FE) MG EC tablet TAKE 1 TABLET EVERY DAY WITH BREAKFAST 09/19/20  Yes Tower, Wynelle Fanny, MD  fluticasone (FLONASE) 50 MCG/ACT nasal spray Place 1 spray into both nostrils daily as needed for allergies.   Yes [provider]  furosemide (LASIX) 40 MG tablet Take 40 mg by mouth daily as needed for edema.   Yes [provider]  hydrOXYzine (ATARAX/VISTARIL) 25 MG tablet Take 1 tablet (25 mg total) by mouth every 6 (six) hours as needed for itching. 12/27/20  Yes Padgett, Rae Halsted, MD  Ketotifen Fumarate (EYE ITCH RELIEF OP) Place 1 drop into both eyes daily as needed (allergies).   Yes [provider]  levothyroxine (SYNTHROID) 75 MCG tablet Take 1 tablet (75 mcg total) by mouth daily before breakfast. 12/25/20  Yes Tower, Wynelle Fanny, MD  Polyethyl Glycol-Propyl Glycol 0.4-0.3 % SOLN Place 1-2 drops into both eyes 3 (three) times daily as  needed (for dry eyes.).   Yes [provider]  warfarin (COUMADIN) 5 MG tablet TAKE 1 TABLET BY MOUTH DAILY EXCEPT TAKE 1 AND 1/2 TABLETS ON WEDNESDAYS OR AS DIRECTED BY ANTICOAGULATION CLINIC 03/05/21  Yes Tower, Marne A, MD  alendronate (FOSAMAX) 70 MG tablet TAKE 1 TABLET EVERY 7 DAYS. TAKE WITH A FULL GLASS OF WATER ON AN EMPTY STOMACH. Patient not taking: Reported on 04/01/2021 07/30/20   Tower, Wynelle Fanny, MD  diclofenac Sodium (VOLTAREN) 1 % GEL Apply 1 application topically 4 (four) times daily as needed (pain). Patient not taking: Reported on 04/01/2021    [provider]    Allergies as of 03/06/2021 - Review Complete 03/02/2021  Allergen Reaction Noted   Amiodarone Swelling 08/27/2015   Penicillins Hives and Rash 04/27/2007   Amiodarone hcl Swelling    Statins Rash 08/27/2015    Family History  Problem Relation Age of Onset   Lung cancer Father        smoker, died at 25   Alcohol abuse Father    Cancer Father        bladder and lung CA smoker    Sudden death Other    Breast cancer Neg Hx    Stroke Neg Hx     Social History   Socioeconomic History   Marital status: Widowed    Spouse name: Not on file   Number of children: 6   Years of education: Not on file   Highest education level: Not on file  Occupational History   Occupation: realtor    Employer: RETIRED  Tobacco Use   Smoking status: Never   Smokeless tobacco: Never  Vaping Use   Vaping Use: Never used  Substance and Sexual Activity   Alcohol use: Not Currently    Comment: seldom   Drug use: No   Sexual activity: Not Currently  Other Topics Concern   Not on file  Social History Narrative   Retired. Daily Caffeine use: 2 daily    Social Determinants of Health   Financial Resource Strain: Low Risk    Difficulty of Paying Living Expenses: Not hard at all  Food Insecurity: No Food Insecurity   Worried About Charity fundraiser in the Last Year: Never true   Ran Out of Food in the Last Year: Never true  Transportation Needs: No Transportation Needs   Lack of Transportation (Medical): No   Lack of Transportation (Non-Medical): No  Physical Activity: Inactive   Days of Exercise per Week: 0 days   Minutes of Exercise per Session: 0 min  Stress: No Stress Concern Present   Feeling of Stress : Only a little  Social Connections: Moderately Integrated   Frequency of Communication with Friends and Family: More than three times a week   Frequency of Social Gatherings with Friends and Family: Three times a week   Attends Religious Services: More than 4 times per year   Active Member of Clubs or Organizations: Yes   Attends Archivist Meetings: More than 4 times per year   Marital Status: Widowed  Human resources officer Violence: Not At Risk   Fear of Current or Ex-Partner: No   Emotionally Abused: No   Physically Abused: No   Sexually Abused: No    Review of Systems: See HPI, otherwise negative ROS  Physical Exam: BP (!) 151/96    Pulse 77    Temp  (!) 97.5 F (36.4 C) (Temporal)    Resp 15  Ht 5\' 4"  (1.626 m)    Wt 72.6 kg    SpO2 98%    BMI 27.46 kg/m  General:   Alert,  pleasant and cooperative in NAD Head:  Normocephalic and atraumatic. Lungs:  Clear to auscultation.    Heart:  Regular rate and rhythm.  Impression/Plan: [name] is here for ophthalmic surgery.  Risks, benefits, limitations, and alternatives regarding ophthalmic surgery have been reviewed with the patient.  Questions have been answered.  All parties agreeable.   Leandrew Koyanagi, MD  04/23/2021, 1:58 PM

## 2021-04-23 NOTE — Op Note (Signed)
OPERATIVE NOTE  [name] [id number] 04/23/2021   PREOPERATIVE DIAGNOSIS:  Nuclear sclerotic cataract left eye. H25.12   POSTOPERATIVE DIAGNOSIS:    Nuclear sclerotic cataract left eye.     PROCEDURE:  Phacoemusification with posterior chamber intraocular lens placement of the left eye  Ultrasound time: Procedure(s) with comments: CATARACT EXTRACTION PHACO AND INTRAOCULAR LENS PLACEMENT (IOC) LEFT (Left) - [place] 5.65 00:50.1  LENS:   Implant Name Type Inv. Item Serial No. Manufacturer Lot No. LRB No. Used Action  LENS IOL TECNIS EYHANCE 24.0 - [id number] Intraocular Lens LENS IOL TECNIS EYHANCE 24.0 [id number] [employer]  Left 1 Implanted      SURGEON:  Merleen Nicely, MD assisted by Wyonia Hough, MD   ANESTHESIA:  Topical with tetracaine drops and 2% Xylocaine jelly, augmented with 1% preservative-free intracameral lidocaine.    COMPLICATIONS:  None.   DESCRIPTION OF PROCEDURE:  The patient was identified in the holding room and transported to the operating room and placed in the supine position under the operating microscope.  The left eye was identified as the operative eye and it was prepped and draped in the usual sterile ophthalmic fashion.   A 1 millimeter clear-corneal paracentesis was made inferotemporally.  0.5 ml of preservative-free 1% lidocaine was injected into the anterior chamber.  The anterior chamber was filled with Viscoat viscoelastic.  A 2.4 millimeter keratome was used to make a near-clear corneal incision superotemporally.  .  A curvilinear capsulorrhexis was made with a cystotome and capsulorrhexis forceps.  Balanced salt solution was used to hydrodissect and hydrodelineate the nucleus.   Phacoemulsification was then used in stop and chop fashion to remove the lens nucleus and epinucleus.  The remaining cortex was then removed using the irrigation and aspiration handpiece. Provisc was then placed into the capsular bag to distend it for lens  placement.  A lens was then injected into the capsular bag.  The remaining viscoelastic was aspirated.   Wounds were hydrated with balanced salt solution.  The anterior chamber was inflated to a physiologic pressure with balanced salt solution.  No wound leaks were noted. Vigamox 0.2 ml of a 1mg  per ml solution was injected into the anterior chamber for a dose of 0.2 mg of intracameral antibiotic at the completion of the case.   Timolol and Brimonidine drops were applied to the eye.  The patient was taken to the recovery room in stable condition without complications of anesthesia or surgery.  I participated in the entire procedure.  [name] 04/23/2021, 2:46 PM

## 2021-04-23 NOTE — Anesthesia Procedure Notes (Signed)
Procedure Name: MAC Date/Time: 04/23/2021 2:14 PM Performed by: Jeannene Patella, CRNA Pre-anesthesia Checklist: Patient identified, Emergency Drugs available, Suction available, Timeout performed and Patient being monitored Patient Re-evaluated:Patient Re-evaluated prior to induction Oxygen Delivery Method: Nasal cannula Placement Confirmation: positive ETCO2

## 2021-04-23 NOTE — Anesthesia Preprocedure Evaluation (Signed)
Anesthesia Evaluation  Patient identified by MRN, date of birth, ID band  Reviewed: Allergy & Precautions, NPO status , Patient's Chart, lab work & pertinent test results  History of Anesthesia Complications (+) PROLONGED EMERGENCE and history of anesthetic complications  Airway Mallampati: II  TM Distance: >3 FB Neck ROM: Full    Dental no notable dental hx.    Pulmonary shortness of breath, asthma ,    Pulmonary exam normal breath sounds clear to auscultation       Cardiovascular Exercise Tolerance: Good Normal cardiovascular exam+ dysrhythmias (on Coumadin) Atrial Fibrillation + pacemaker  Rhythm:Regular Rate:Normal     Neuro/Psych  Headaches, PSYCHIATRIC DISORDERS Anxiety Depression CVA (TIA)    GI/Hepatic negative GI ROS, Neg liver ROS,   Endo/Other  Hypothyroidism   Renal/GU negative Renal ROS     Musculoskeletal  (+) Arthritis , Osteoarthritis,    Abdominal Normal abdominal exam  (+)  Abdomen: soft.    Peds  Hematology  (+) anemia ,   Anesthesia Other Findings   Reproductive/Obstetrics                             Anesthesia Physical  Anesthesia Plan  ASA: 3  Anesthesia Plan: MAC   Post-op Pain Management: Minimal or no pain anticipated   Induction: Intravenous  PONV Risk Score and Plan: 2 and Treatment may vary due to age or medical condition, Midazolam and TIVA  Airway Management Planned: Nasal Cannula and Natural Airway  Additional Equipment: None  Intra-op Plan:   Post-operative Plan:   Informed Consent: I have reviewed the patients History and Physical, chart, labs and discussed the procedure including the risks, benefits and alternatives for the proposed anesthesia with the patient or authorized representative who has indicated his/her understanding and acceptance.       Plan Discussed with: CRNA  Anesthesia Plan Comments:         Anesthesia Quick  Evaluation   Patient Active Problem List   Diagnosis Date Noted   Brain fog 01/14/2021   Pressure in head 01/14/2021   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/20/2020   Post-menopausal bleeding 11/20/2020   Facial paresthesia 10/14/2020   Insect bite 09/20/2020   Grief reaction 09/20/2020   Hearing loss 06/13/2020   Atrial fibrillation ([place]) 04/24/2020   Pelvic pain 03/11/2020   Routine general medical examination at a health care facility 03/11/2020   Elevated glucose 03/03/2020   Rectal bleeding 02/18/2020   History of TIA (transient ischemic attack) 02/18/2020   S/P AV nodal ablation 07/28/19 07/29/2019   S/P placement of cardiac pacemaker MDT 07/28/19 07/29/2019   AV block 07/28/2019   CVA (cerebral vascular accident) ([place]) 06/16/2019   Facial tingling 11/29/2018   Tremor of left hand 11/29/2018   Medicare annual wellness visit, subsequent 11/24/2018   Dysuria 02/06/2018   Dizzy 02/04/2018   Iron deficiency anemia 10/21/2017   Rapid atrial fibrillation ([place]) 08/19/2017   Constipation 08/02/2017   Numbness and tingling 07/14/2017   Paresthesia 07/14/2017   Unilateral primary osteoarthritis, left hip 05/04/2017   Status post total replacement of left hip 05/04/2017   Hip osteoarthritis 04/27/2017   Long term (current) use of anticoagulants 03/04/2017   Venous stasis dermatitis of both lower extremities 01/08/2017   Impacted cerumen of right ear 11/20/2016   Osteopenia 10/25/2016   Pedal edema 08/26/2016   Varicose veins of both lower extremities 08/26/2016   Estrogen deficiency 08/26/2016   Screening mammogram, encounter for 08/26/2016  Hemorrhoids 08/26/2016   History of nonmelanoma skin cancer 01/01/2016   Pruritus 10/04/2015   Urticaria 08/22/2014   Hip pain 08/02/2014   Left knee pain 08/02/2014   Chronic cough 05/08/2014   Hematochezia 04/09/2014   Colon cancer screening 08/16/2013   Encounter for therapeutic drug monitoring  04/20/2013   Left ovarian cyst 03/14/2013   Palpitations 04/15/2012   COLONIC POLYPS, ADENOMATOUS, HX OF 09/18/2009   PULMONARY NODULE 12/20/2008   GANGLION CYST 10/04/2007   Mixed incontinence 04/28/2007   Hyperlipidemia 04/27/2007   Depression with anxiety 04/27/2007   Asthma, mild intermittent 04/27/2007   INSOMNIA 04/27/2007   ADENOMATOUS COLONIC POLYP 11/04/2006   Hypothyroidism 09/02/2006   Atrial fibrillation, chronic ([place]) 08/05/2006    CBC Latest Ref Rng & Units 03/28/2021 03/02/2021 12/20/2020  WBC 4.0 - 10.5 K/uL 5.0 5.1 4.0  Hemoglobin 12.0 - 15.0 g/dL 12.3 13.6 13.0  Hematocrit 36.0 - 46.0 % 37.5 41.3 40.6  Platelets 150 - 400 K/uL 151 164 131.0(L)   BMP Latest Ref Rng & Units 03/02/2021 11/05/2020 05/13/2020  Glucose 70 - 99 mg/dL 170(H) 90 93  BUN 8 - 23 mg/dL 24(H) 19 22  Creatinine 0.44 - 1.00 mg/dL 0.78 0.65 0.68  BUN/Creat Ratio 12 - 28 - - 32(H)  Sodium 135 - 145 mmol/L 136 142 142  Potassium 3.5 - 5.1 mmol/L 3.7 4.5 4.6  Chloride 98 - 111 mmol/L 101 106 103  CO2 22 - 32 mmol/L _0 Calcium 8.9 - 10.3 mg/dL 9.3 9.1 9.3    Risks and benefits of anesthesia discussed at length, patient or surrogate demonstrates understanding. Appropriately NPO. Plan to proceed with anesthesia.  Champ Mungo, MD 04/23/21

## 2021-04-23 NOTE — Transfer of Care (Signed)
Immediate Anesthesia Transfer of Care Note  Patient: [name]  Procedure(s) Performed: CATARACT EXTRACTION PHACO AND INTRAOCULAR LENS PLACEMENT (IOC) LEFT (Left: Eye)  Patient Location: PACU  Anesthesia Type: MAC  Level of Consciousness: awake, alert  and patient cooperative  Airway and Oxygen Therapy: Patient Spontanous Breathing and Patient connected to supplemental oxygen  Post-op Assessment: Post-op Vital signs reviewed, Patient's Cardiovascular Status Stable, Respiratory Function Stable, Patent Airway and No signs of Nausea or vomiting  Post-op Vital Signs: Reviewed and stable  Complications: No notable events documented.

## 2021-04-24 ENCOUNTER — Encounter: Payer: Self-pay | Admitting: Ophthalmology

## 2021-04-24 ENCOUNTER — Telehealth: Payer: Self-pay

## 2021-04-24 NOTE — Progress Notes (Signed)
Chronic Care Management Pharmacy Assistant   Name: [name]  MRN: [id number] DOB: 09/12/1932  Reason for Encounter: CCM (Initial Questions)   Recent office visits:  03/27/2021 - [name], MD - Patient Message - Referral to Neurology due to [place], brain fog and pressure in head.  03/10/2021 Orrin Brigham, LPN - Patient presented for Annual Wellness Exam.  01/14/2021 - [name], MD - Patient presented for dizziness. Referral to Neurology.  11/27/2020 - [name], MD - Telephone - US Pelvis results: Fibroid. Referral to Gynecology. 11/20/2020 [name], MD - Telephone - Change: levothyroxine (SYNTHROID) 75 MCG tablet - 75 mcg vs. 50 mcg.  11/20/2020 - [name], MD - Patient presented for post-menopausal bleeding. Ordered: US Pelvic complete with transvaginal. Labs: INR  Recent consult visits:  04/24/2021 - [place] - Patient presented for cataract extraction, left.  04/09/2021 - [place] - Patient presented for cataract extraction, right.  02/26/2021 - [place] - Patient presented for left ovarian cyst.  02/24/2021 - Malachy Mood - Radiology - Patient presented for US Pelvis.  12/27/2020 - Charmian Muff, MD - Allergy - Patient presented for allergic rhinitis. Ordered: Allergy test. Start: hydrOXYzine (ATARAX/VISTARIL) 25 MG tablet. 12/24/2020 - Alric Ran, MD - Neurology - Patient presented for abnormal sensation when she is near her oven range, magnetic oven range. No medication changes.  12/17/2020 - Malachy Mood, MD - OBGYN - Patient presented for ovarian cyst. Ordered: US Pelvic with Transvaginal.  11/21/2020 - Radiology - Patient presented for US Pelvis.  11/19/2020 - Virl Axe, MD - Cardiology - Patient presented for permanent atrial fibrillation. Labs: EKG and Pacemaker external.   Anti-Coag Visits: 03/28/2021 - INR: 2.7 - Continue 1 tablet daily except take 1 1/2 tablets on Wednesdays.   02/21/2021 - INR: 2.5 - Continue 1 tablet daily except take 1 1/2 tablets on Wednesdays. 01/17/2021 - INR: 2.7 - Continue 1 tablet daily except take 1 1/2 tablets on Wednesdays. 01/03/2021 - INR 1.8 - Increase dose today to 2 tablet and then change weekly dose to take 1 tablet daily except take 1 1/2 tablets on Wednesdays. 12/20/2020 - INR: 1.6 - Increase dose today to 1 tablet and increase dose tomorrow to 1 1/2 tablets and then change weekly dose to take 1 tablet daily. 11/20/2020 - INR: 1.7 - Increase dose today to 7.5mg  and then continue 5mg  daily except 2.5mg  on Mondays and Fridays. 10/24/2020 - INR: 2.2 - Continue 5mg  daily except 2.5mg  on Mondays and Fridays.   Hospital visits:  Medication Reconciliation was completed by comparing discharge summary, patients EMR and Pharmacy list, and upon discussion with patient.  Admitted to the hospital on 03/02/2021 due to blurry vision. Discharge date was 03/03/2021 Discharged from [facility].     Final Diagnoses:  Blurry vision, bilateral  New?Medications Started at [facility] Discharge:?? None noted  Medication Changes at Hospital Discharge: None noted  Medications Discontinued at Hospital Discharge: None noted  Medications that remain the same after Hospital Discharge:??  -All other medications will remain the same.      Medication Reconciliation was completed by comparing discharge summary, patients EMR and Pharmacy list, and upon discussion with patient.  Admitted to the hospital on 11/05/2020 due to altered mental state. Discharge date was 11/06/2020. Discharged from [facility].    Final Diagnoses:  Transient alteration of awareness  New?Medications Started at [facility] Discharge:?? None noted  Medication Changes at Hospital Discharge: None  noted  Medications Discontinued at Hospital Discharge: None noted  Medications that remain the same after Hospital Discharge:??  -All other medications will  remain the same.    Medications: Outpatient Encounter Medications as of 04/24/2021  Medication Sig   acetaminophen (TYLENOL) 500 MG tablet Take 500 mg by mouth every 6 (six) hours as needed for moderate pain.   albuterol (VENTOLIN HFA) 108 (90 Base) MCG/ACT inhaler Inhale into the lungs every 6 (six) hours as needed for wheezing or shortness of breath.   alendronate (FOSAMAX) 70 MG tablet TAKE 1 TABLET EVERY 7 DAYS. TAKE WITH A FULL GLASS OF WATER ON AN EMPTY STOMACH. (Patient not taking: Reported on 04/01/2021)   cetirizine (ZYRTEC) 10 MG tablet Take 10 mg by mouth daily as needed for allergies.   diclofenac Sodium (VOLTAREN) 1 % GEL Apply 1 application topically 4 (four) times daily as needed (pain). (Patient not taking: Reported on 04/01/2021)   diphenhydrAMINE-zinc acetate (BENADRYL) cream Apply 1 application topically 3 (three) times daily as needed for itching.   ferrous sulfate 325 (65 FE) MG EC tablet TAKE 1 TABLET EVERY DAY WITH BREAKFAST   fluticasone (FLONASE) 50 MCG/ACT nasal spray Place 1 spray into both nostrils daily as needed for allergies.   furosemide (LASIX) 40 MG tablet Take 40 mg by mouth daily as needed for edema.   hydrOXYzine (ATARAX/VISTARIL) 25 MG tablet Take 1 tablet (25 mg total) by mouth every 6 (six) hours as needed for itching.   Ketotifen Fumarate (EYE ITCH RELIEF OP) Place 1 drop into both eyes daily as needed (allergies).   levothyroxine (SYNTHROID) 75 MCG tablet Take 1 tablet (75 mcg total) by mouth daily before breakfast.   Polyethyl Glycol-Propyl Glycol 0.4-0.3 % SOLN Place 1-2 drops into both eyes 3 (three) times daily as needed (for dry eyes.).   warfarin (COUMADIN) 5 MG tablet TAKE 1 TABLET BY MOUTH DAILY EXCEPT TAKE 1 AND 1/2 TABLETS ON WEDNESDAYS OR AS DIRECTED BY ANTICOAGULATION CLINIC   No facility-administered encounter medications on file as of 04/24/2021.   Lab Results  Component Value Date/Time   HGBA1C 5.6 03/04/2020 08:19 AM   HGBA1C 5.8 (H)  06/17/2019 04:32 AM    BP Readings from Last 3 Encounters:  04/23/21 124/77  04/09/21 (!) 144/72  03/02/21 132/78   Patient contacted to review initial questions prior to visit with Charlene Brooke.  Have you seen any other providers since your last visit with PCP? Yes  Any changes in your medications or health? Yes - Patient had cataract surgery on 01/18 and 02/02. Patient states she is doing well.   Any side effects from any medications? No  Do you have an symptoms or problems not managed by your medications? Yes - See below  Any concerns about your health right now? Yes - Patient is having issues with being around electrical devices. If patient goes near her stove (she had to get a new one) she will feel like someone is holding on to her shoulders and pushing her down. Patient also described it as being a little kid and spinning until you are dizzy when you stop spinning. Patient is going to [facility] in June to address the problem. Patient is having a difficult time as she can not be close to any bid electrical device.  The issue is being caused by her Pacemaker.   Has your provider asked that you check blood pressure, blood sugar, or follow special diet at home? No   Do you get any  type of exercise on a regular basis? Yes - Patient will walk and swim when weather permits.   Can you think of a goal you would like to reach for your health? Yes - Find out why she keeps having issues around strong electrical devices. She has an appointment at [facility] in June.   Do you have any problems getting your medications? No  Is there anything that you would like to discuss during the appointment? No  Spoke with patient and reminded them to have all medications, supplements and any blood glucose and blood pressure readings available for review with pharmacist, at their telephone visit on 04/29/2021 at 9:00 am.    Star Rating Drugs:  Medication:  Last Fill: Day Supply No star rating drugs  noted  Care Gaps: Annual wellness visit in last year? Yes 03/10/2021 Most Recent BP reading: 124/77 on 04/23/2021  Charlene Brooke, CPP notified  Marijean Niemann, [facility] Pharmacy Assistant [phone]  Time Spent: 47 Minutes

## 2021-04-25 ENCOUNTER — Telehealth: Payer: Self-pay | Admitting: Internal Medicine

## 2021-04-25 ENCOUNTER — Other Ambulatory Visit: Payer: Self-pay

## 2021-04-25 ENCOUNTER — Ambulatory Visit (INDEPENDENT_AMBULATORY_CARE_PROVIDER_SITE_OTHER): Payer: Medicare Other

## 2021-04-25 DIAGNOSIS — Z7901 Long term (current) use of anticoagulants: Secondary | ICD-10-CM

## 2021-04-25 LAB — POCT INR: INR: 3.7 — AB (ref 2.0–3.0)

## 2021-04-25 NOTE — Patient Instructions (Addendum)
Pre visit review using our clinic review tool, if applicable. No additional management support is needed unless otherwise documented below in the visit note.  Hold dose tomorrow and then change weekly dose to take 1 tablet daily except take 1/2 tablet on Wednesdays. Recheck in 2 weeks.

## 2021-04-25 NOTE — Telephone Encounter (Signed)
I spoke with the patient and called [employer] on conference to see if her tablet was compatible with her pacemaker. The tablet is not compatible. She is convinced that the monitor is making her dizzy when it is plugged in and refuse to use the monitor.  I let her know since she has a pacemaker she can come into the office every six months to have her device checked. She agreed to come into the office to get her device check.   I told her she will need to call in May to schedule that appointment.

## 2021-04-25 NOTE — Progress Notes (Signed)
Pt already took warfarin today. Hold dose tomorrow and then change weekly dose to take 1 tablet daily except take 1/2 tablet on Wednesdays. Recheck in 2 weeks.

## 2021-04-25 NOTE — Telephone Encounter (Signed)
° °  Pt is calling, she said, she still not able to send her transmission and need help

## 2021-04-26 ENCOUNTER — Other Ambulatory Visit: Payer: Self-pay | Admitting: Family Medicine

## 2021-04-29 ENCOUNTER — Telehealth: Payer: Self-pay | Admitting: Pharmacist

## 2021-04-29 ENCOUNTER — Telehealth: Payer: Medicare Other

## 2021-04-29 NOTE — Progress Notes (Incomplete)
Chronic Care Management Pharmacy Note  04/29/2021 Name:  [name] MRN:  [id number] DOB:  [birth year]  Summary: ***  Recommendations/Changes made from today's visit: ***  Plan: ***   Subjective: [name] is an 88 y.o. year old female who is a primary patient of Tower, Wynelle Fanny, MD.  The CCM team was consulted for assistance with disease management and care coordination needs.    Engaged with patient by telephone for initial visit in response to provider referral for pharmacy case management and/or care coordination services.   Consent to Services:  The patient was given the following information about Chronic Care Management services today, agreed to services, and gave verbal consent: 1. CCM service includes personalized support from designated clinical staff supervised by the primary care provider, including individualized plan of care and coordination with other care providers 2. 24/7 contact phone numbers for assistance for urgent and routine care needs. 3. Service will only be billed when office clinical staff spend 20 minutes or more in a month to coordinate care. 4. Only one practitioner may furnish and bill the service in a calendar month. 5.The patient may stop CCM services at any time (effective at the end of the month) by phone call to the office staff. 6. The patient will be responsible for cost sharing (co-pay) of up to 20% of the service fee (after annual deductible is met). Patient agreed to services and consent obtained.  Patient Care Team: Tower, Wynelle Fanny, MD as PCP - General Deboraha Sprang, MD as PCP - Cardiology (Cardiology) Charlton Haws, [facility] as Pharmacist (Pharmacist)  Recent office visits: 03/27/2021 - [name], MD - Patient Message - Referral to Neurology due to [place], brain fog and pressure in head.  03/10/2021 Orrin Brigham, LPN - Patient presented for Annual Wellness Exam.  01/14/2021 - [name], MD - Patient presented for  dizziness. Referral to Neurology.  11/27/2020 - [name], MD - Telephone - US Pelvis results: Fibroid. Referral to Gynecology. 11/20/2020 [name], MD - Telephone - Change: levothyroxine (SYNTHROID) 75 MCG tablet - 75 mcg vs. 50 mcg.  11/20/2020 - [name], MD - Patient presented for post-menopausal bleeding. Ordered: US Pelvic complete with transvaginal. Labs: INR  Recent consult visits: 04/24/2021 - [place] - Patient presented for cataract extraction, left.  04/09/2021 - [place] - Patient presented for cataract extraction, right.  02/26/2021 - [place] - Patient presented for left ovarian cyst.  02/24/2021 - Malachy Mood - Radiology - Patient presented for US Pelvis.  12/27/2020 - Charmian Muff, MD - Allergy - Patient presented for allergic rhinitis. Ordered: Allergy test. Start: hydrOXYzine (ATARAX/VISTARIL) 25 MG tablet. 12/24/2020 - Alric Ran, MD - Neurology - Patient presented for abnormal sensation when she is near her oven range, magnetic oven range. No medication changes.  12/17/2020 - Malachy Mood, MD - OBGYN - Patient presented for ovarian cyst. Ordered: US Pelvic with Transvaginal.  11/21/2020 - Radiology - Patient presented for US Pelvis.  11/19/2020 - Virl Axe, MD - Cardiology - Patient presented for permanent atrial fibrillation. Labs: EKG and Pacemaker external.     Hospital visits: Medication Reconciliation was completed by comparing discharge summary, patients EMR and Pharmacy list, and upon discussion with patient.   Admitted to the hospital on 03/02/2021 due to blurry vision. Discharge date was 03/03/2021 Discharged from [facility].     Medications that remain the same after Hospital Discharge:??  -All other  medications will remain the same.      Admitted to the hospital on 11/05/2020 due to altered mental state. Discharge date was 11/06/2020. Discharged from [facility].      Final Diagnoses:  Transient alteration of awareness   Medications that remain the same after Hospital Discharge:??  -All other medications will remain the same.     Objective:  Lab Results  Component Value Date   CREATININE 0.78 03/02/2021   BUN 24 (H) 03/02/2021   GFR 68.24 03/04/2020   GFRNONAA >60 03/02/2021   GFRAA 92 05/13/2020   NA 136 03/02/2021   K 3.7 03/02/2021   CALCIUM 9.3 03/02/2021   CO2 27 03/02/2021   GLUCOSE 170 (H) 03/02/2021    Lab Results  Component Value Date/Time   HGBA1C 5.6 03/04/2020 08:19 AM   HGBA1C 5.8 (H) 06/17/2019 04:32 AM   GFR 68.24 03/04/2020 08:19 AM   GFR 64.62 09/08/2018 08:59 AM    Last diabetic Eye exam: No results found for: HMDIABEYEEXA  Last diabetic Foot exam: No results found for: HMDIABFOOTEX   Lab Results  Component Value Date   CHOL 165 12/20/2020   HDL 46.30 12/20/2020   LDLCALC 105 (H) 12/20/2020   LDLDIRECT 203.8 01/09/2008   TRIG 71.0 12/20/2020   CHOLHDL 4 12/20/2020    Hepatic Function Latest Ref Rng & Units 03/02/2021 11/05/2020 03/04/2020  Total Protein 6.5 - 8.1 g/dL 7.4 6.9 6.7  Albumin 3.5 - 5.0 g/dL 3.9 3.8 3.9  AST 15 - 41 U/L 24 39 18  ALT 0 - 44 U/L 18 48(H) 12  Alk Phosphatase 38 - 126 U/L 42 46 44  Total Bilirubin 0.3 - 1.2 mg/dL 0.7 0.6 0.5  Bilirubin, Direct 0.0 - 0.3 mg/dL - - -    Lab Results  Component Value Date/Time   TSH 3.06 12/20/2020 08:12 AM   TSH 5.131 (H) 11/06/2020 05:34 AM   TSH 3.41 04/23/2020 09:08 AM   FREET4 0.90 09/14/2013 08:33 AM    CBC Latest Ref Rng & Units 03/28/2021 03/02/2021 12/20/2020  WBC 4.0 - 10.5 K/uL 5.0 5.1 4.0  Hemoglobin 12.0 - 15.0 g/dL 12.3 13.6 13.0  Hematocrit 36.0 - 46.0 % 37.5 41.3 40.6  Platelets 150 - 400 K/uL 151 164 131.0(L)   Lab Results  Component Value Date/Time   INR 3.7 (A) 04/25/2021 12:00 AM   INR 2.7 03/28/2021 12:00 AM   INR 2.6 (H) 03/02/2021 03:27 PM   INR 1.3 (H) 03/04/2020 08:19 AM   INR 1.7 06/26/2013 10:20 PM   INR  1.6 02/28/2013 11:10 AM    CHA2DS2/VAS Stroke Risk Points  Current as of 35 minutes ago     7 >= 2 Points: High Risk  1 - 1.99 Points: Medium Risk  0 Points: Low Risk    Last Change: N/A     Points Metrics  1 Has Congestive Heart Failure:  Yes    Current as of 35 minutes ago  0 Has Vascular Disease:  No    Current as of 35 minutes ago  1 Has Hypertension:  Yes    Current as of 35 minutes ago  2 Age:  37    Current as of 35 minutes ago  0 Has Diabetes:  No    Current as of 35 minutes ago  2 Had Stroke:  Yes  Had TIA:  No  Had Thromboembolism:  No    Current as of 35 minutes ago  1 Female:  Yes  Current as of 35 minutes ago        No results found for: VD25OH  Clinical ASCVD: {YES/NO:21197} The ASCVD Risk score (Arnett DK, et al., 2019) failed to calculate for the following reasons:   The 2019 ASCVD risk score is only valid for ages 90 to 81   The patient has a prior MI or stroke diagnosis    Depression screen [facility] 2/9 03/10/2021 03/08/2020 09/05/2018  Decreased Interest 0 0 0  Down, Depressed, Hopeless 0 0 0  PHQ - 2 Score 0 0 0  Altered sleeping - 0 0  Tired, decreased energy - 0 0  Change in appetite - 0 0  Feeling bad or failure about yourself  - 0 0  Trouble concentrating - 0 0  Moving slowly or fidgety/restless - 0 0  Suicidal thoughts - 0 0  PHQ-9 Score - 0 0  Difficult doing work/chores - Not difficult at all Not difficult at all  Some recent data might be hidden     ***Other: (CHADS2VASc if Afib, MMRC or CAT for COPD, ACT, DEXA)  Social History   Tobacco Use  Smoking Status Never  Smokeless Tobacco Never   BP Readings from Last 3 Encounters:  04/23/21 124/77  04/09/21 (!) 144/72  03/02/21 132/78   Pulse Readings from Last 3 Encounters:  04/23/21 68  04/09/21 69  03/02/21 84   Wt Readings from Last 3 Encounters:  04/23/21 160 lb (72.6 kg)  04/09/21 162 lb (73.5 kg)  03/10/21 159 lb (72.1 kg)   BMI Readings from Last 3 Encounters:   04/23/21 27.46 kg/m  04/09/21 27.81 kg/m  03/10/21 27.29 kg/m    Assessment/Interventions: Review of patient past medical history, allergies, medications, health status, including review of consultants reports, laboratory and other test data, was performed as part of comprehensive evaluation and provision of chronic care management services.   SDOH:  (Social Determinants of Health) assessments and interventions performed: {yes/no:20286}  SDOH Screenings   Alcohol Screen: Low Risk    Last Alcohol Screening Score (AUDIT): 0  Depression (PHQ2-9): Low Risk    PHQ-2 Score: 0  Financial Resource Strain: Low Risk    Difficulty of Paying Living Expenses: Not hard at all  Food Insecurity: No Food Insecurity   Worried About Charity fundraiser in the Last Year: Never true   Ran Out of Food in the Last Year: Never true  Housing: Low Risk    Last Housing Risk Score: 0  Physical Activity: Inactive   Days of Exercise per Week: 0 days   Minutes of Exercise per Session: 0 min  Social Connections: Moderately Integrated   Frequency of Communication with Friends and Family: More than three times a week   Frequency of Social Gatherings with Friends and Family: Three times a week   Attends Religious Services: More than 4 times per year   Active Member of Clubs or Organizations: Yes   Attends Archivist Meetings: More than 4 times per year   Marital Status: Widowed  Stress: No Stress Concern Present   Feeling of Stress : Only a little  Tobacco Use: Low Risk    Smoking Tobacco Use: Never   Smokeless Tobacco Use: Never   Passive Exposure: Not on file  Transportation Needs: No Transportation Needs   Lack of Transportation (Medical): No   Lack of Transportation (Non-Medical): No    CCM Care Plan  Allergies  Allergen Reactions   Amiodarone Swelling    SWELLING REACTION  UNSPECIFIED    Penicillins Hives and Rash    Has patient had a PCN reaction causing immediate rash,  facial/tongue/throat swelling, SOB or lightheadedness with hypotension: No Has patient had a PCN reaction causing severe rash involving mucus membranes or skin necrosis: No Has patient had a PCN reaction that required hospitalization:Patient was inpatient when reaction occurred Has patient had a PCN reaction occurring within the last 10 years: No If all of the above answers are "NO", then may proceed with Cephalosporin use   Amiodarone Hcl Swelling    SWELLING REACTION UNSPECIFIED    Statins Rash    REACTION: rash    Medications Reviewed Today     Reviewed by Dettmer, Adron Bene (Registered Nurse) on 04/23/21 at 1228  Med List Status: <None>   Medication Order Taking? Sig Documenting Provider Last Dose Status Informant  acetaminophen (TYLENOL) 500 MG tablet [id number] Yes Take 500 mg by mouth every 6 (six) hours as needed for moderate pain. [provider] Past Month Active   albuterol (VENTOLIN HFA) 108 (90 Base) MCG/ACT inhaler [id number] Yes Inhale into the lungs every 6 (six) hours as needed for wheezing or shortness of breath. [provider] Past Month Active   alendronate (FOSAMAX) 70 MG tablet [id number]  TAKE 1 TABLET EVERY 7 DAYS. TAKE WITH A FULL GLASS OF WATER ON AN EMPTY STOMACH.  Patient not taking: Reported on 04/01/2021   Tower, Wynelle Fanny, MD  Active   cetirizine (ZYRTEC) 10 MG tablet [id number] Yes Take 10 mg by mouth daily as needed for allergies. [provider] Past Month Active   diclofenac Sodium (VOLTAREN) 1 % GEL [id number]  Apply 1 application topically 4 (four) times daily as needed (pain).  Patient not taking: Reported on 04/01/2021   [provider]  Active Self  diphenhydrAMINE-zinc acetate (BENADRYL) cream [id number] Yes Apply 1 application topically 3 (three) times daily as needed for itching. [provider] Past Month Active Self  ferrous sulfate 325 (65 FE) MG EC tablet [id number] Yes TAKE 1 TABLET EVERY DAY WITH BREAKFAST  Tower, Mar[place] A, MD 04/23/2021 0800 Active   fluticasone (FLONASE) 50 MCG/ACT nasal spray [id number] Yes Place 1 spray into both nostrils daily as needed for allergies. [provider] Past Month Active Self  furosemide (LASIX) 40 MG tablet [id number] Yes Take 40 mg by mouth daily as needed for edema. [provider] 04/23/2021 0800 Active   hydrOXYzine (ATARAX/VISTARIL) 25 MG tablet [id number] Yes Take 1 tablet (25 mg total) by mouth every 6 (six) hours as needed for itching. Kennith Gain, MD 04/23/2021 0800 Active   Ketotifen Fumarate (EYE ITCH RELIEF OP) [id number] Yes Place 1 drop into both eyes daily as needed (allergies). [provider] 04/23/2021 0800 Active Self  levothyroxine (SYNTHROID) 75 MCG tablet [id number] Yes Take 1 tablet (75 mcg total) by mouth daily before breakfast. Abner Greenspan, MD 04/23/2021 0800 Active   Polyethyl Glycol-Propyl Glycol 0.4-0.3 % SOLN [id number] Yes Place 1-2 drops into both eyes 3 (three) times daily as needed (for dry eyes.). [provider] 04/23/2021 0800 Active Self  warfarin (COUMADIN) 5 MG tablet [id number] Yes TAKE 1 TABLET BY MOUTH DAILY EXCEPT TAKE 1 AND 1/2 TABLETS ON [facility] OR AS DIRECTED BY ANTICOAGULATION CLINIC Tower, Wynelle Fanny, MD 04/23/2021 0800 Active             Patient Active Problem List   Diagnosis Date Noted   Brain fog 01/14/2021   Pressure in head 01/14/2021   Prolapse  urethral mucosa 11/20/2020   Post-menopausal bleeding 11/20/2020   Facial paresthesia 10/14/2020   Insect bite 09/20/2020   Grief reaction 09/20/2020   Hearing loss 06/13/2020   Atrial fibrillation ([place]) 04/24/2020   Pelvic pain 03/11/2020   Routine general medical examination at a health care facility 03/11/2020   Elevated glucose 03/03/2020   Rectal bleeding 02/18/2020   History of TIA (transient ischemic attack) 02/18/2020   S/P AV nodal ablation 07/28/19 07/29/2019   S/P placement of cardiac pacemaker MDT 07/28/19 07/29/2019    AV block 07/28/2019   CVA (cerebral vascular accident) ([place]) 06/16/2019   Facial tingling 11/29/2018   Tremor of left hand 11/29/2018   Medicare annual wellness visit, subsequent 11/24/2018   Dysuria 02/06/2018   Dizzy 02/04/2018   Iron deficiency anemia 10/21/2017   Rapid atrial fibrillation ([place]) 08/19/2017   Constipation 08/02/2017   Numbness and tingling 07/14/2017   Paresthesia 07/14/2017   Unilateral primary osteoarthritis, left hip 05/04/2017   Status post total replacement of left hip 05/04/2017   Hip osteoarthritis 04/27/2017   Long term (current) use of anticoagulants 03/04/2017   Venous stasis dermatitis of both lower extremities 01/08/2017   Impacted cerumen of right ear 11/20/2016   Osteopenia 10/25/2016   Pedal edema 08/26/2016   Varicose veins of both lower extremities 08/26/2016   Estrogen deficiency 08/26/2016   Screening mammogram, encounter for 08/26/2016   Hemorrhoids 08/26/2016   History of nonmelanoma skin cancer 01/01/2016   Pruritus 10/04/2015   Urticaria 08/22/2014   Hip pain 08/02/2014   Left knee pain 08/02/2014   Chronic cough 05/08/2014   Hematochezia 04/09/2014   Colon cancer screening 08/16/2013   Encounter for therapeutic drug monitoring 04/20/2013   Left ovarian cyst 03/14/2013   Palpitations 04/15/2012   COLONIC POLYPS, ADENOMATOUS, HX OF 09/18/2009   PULMONARY NODULE 12/20/2008   GANGLION CYST 10/04/2007   Mixed incontinence 04/28/2007   Hyperlipidemia 04/27/2007   Depression with anxiety 04/27/2007   Asthma, mild intermittent 04/27/2007   INSOMNIA 04/27/2007   ADENOMATOUS COLONIC POLYP 11/04/2006   Hypothyroidism 09/02/2006   Atrial fibrillation, chronic ([place]) 08/05/2006    Immunization History  Administered Date(s) Administered   Influenza,inj,Quad PF,6+ Mos 01/19/2017   Pneumococcal Conjugate-13 08/26/2016   Pneumococcal Polysaccharide-23 09/01/2017   Td 02/03/2005   Tdap 09/30/2020    Conditions to be  addressed/monitored:  Hyperlipidemia, Atrial Fibrillation, Asthma, Hypothyroidism, Depression, Anxiety, and Osteopenia  There are no care plans that you recently modified to display for this patient.    Medication Assistance: {MEDASSISTANCEINFO:25044}  Compliance/Adherence/Medication fill history: Care Gaps: None  Star-Rating Drugs: None  Patient's preferred pharmacy [facility]:  [id number] [place] [address]Phone: [phone]: [phone] [place]Phone: [phone]: [phone] Uses pill box? {Yes or If no, why not?:20788} Pt endorses ***% compliance  We discussed: {Pharmacy options:24294} Patient decided to: {US Pharmacy Plan:23885}  Care Plan and Follow Up Patient Decision:  {FOLLOWUP:24991}  Plan: {CM FOLLOW UP PPQAE:49753} ***    Current Barriers:  {pharmacybarriers:24917}  Pharmacist Clinical Goal(s):  Patient will {PHARMACYGOALCHOICES:24921} through collaboration with PharmD and provider.   Interventions: 1:1 collaboration with Tower, MWynelle Fanny MD regarding development and update of comprehensive plan of care as evidenced by provider attestation and co-signature Inter-disciplinary care team collaboration (see longitudinal plan of care) Comprehensive medication review performed; medication list updated in  electronic medical record  Hyperlipidemia: (LDL goal < 100) -{US controlled/uncontrolled:25276} -Current treatment: None -Medications previously tried: ezetimibe, statins (rash) -Current dietary patterns: *** -Current exercise habits: *** -Educated on {CCM HLD Counseling:25126} -{CCMPHARMDINTERVENTION:25122}  Pedal edema (Goal: manage symptoms) -{US controlled/uncontrolled:25276} -Current treatment  Furosemide 40 mg daily -Medications previously tried: ***   -{CCMPHARMDINTERVENTION:25122}  Atrial Fibrillation (Goal: prevent stroke and major bleeding) -{US controlled/uncontrolled:25276} -CHADSVASC: 7; hx of CVA; hx rectal bleeding -Warfarin per PCP clinic -Current treatment: Warfarin 5 mg -Medications previously tried: diltiazem, digoxin, dofetilide, Eliquis, amiodarone -Home BP and HR readings: ***  -Counseled on {CCMAFIBCOUNSELING:25120} -{CCMPHARMDINTERVENTION:25122}  Asthma (Goal: control symptoms and prevent exacerbations) -{US controlled/uncontrolled:25276} -Current treatment  Albuterol HFA prn Cetirizine 10 mg daily Fluticasone nasal spray -Medications previously tried: ***  -Pulmonary function testing: not on file -Exacerbations requiring treatment in last 6 months: *** -Patient {Actions; denies-reports:120008} consistent use of maintenance inhaler -Frequency of rescue inhaler use: *** -Counseled on {CCMINHALERCOUNSELING:25121} -{CCMPHARMDINTERVENTION:25122}  Depression/Anxiety (Goal: manage symptoms) -{US controlled/uncontrolled:25276} -PHQ9: 0 (02/2021) - no/minimal depression -GAD7: not on file -Connected with PCP for mental health support -Current treatment: Hydroxyzine 25 mg PRN (itching) -Medications previously tried/failed: none -Educated on {CCM mental health counseling:25127} -{CCMPHARMDINTERVENTION:25122}  Osteopenia (Goal prevent fractures) -{US controlled/uncontrolled:25276} -Last DEXA Scan: 04/25/2019 (improved from previous 2018); recheck 2 years  T-Score femoral neck: -1.6  T-Score lumbar spine: -1.1 -Current treatment  Alendronate 70 mg weekly (08/2017 - present) -Medications previously tried: ***  -{Osteoporosis Counseling:23892} -{CCMPHARMDINTERVENTION:25122}  Hypothyroidism (Goal: maintain TSH in goal range) -{US controlled/uncontrolled:25276} -Current treatment  Levothyroxine 75 mcg daily -Medications previously tried: ***  -{CCMPHARMDINTERVENTION:25122}  Health Maintenance -Vaccine gaps:  Shingrix -Current therapy:  Tylenol 500 mg PRN Voltaren gel PRN Benadryl cream PRN Ferrous sulfate 325 mg daily Ketotifen eye drops -Educated on {ccm supplement counseling:25128} -{CCM Patient satisfied:25129} -{CCMPHARMDINTERVENTION:25122}  Patient Goals/Self-Care Activities Patient will:  - {pharmacypatientgoals:24919}

## 2021-04-29 NOTE — Telephone Encounter (Signed)
°  Chronic Care Management   Outreach Note  04/29/2021 Name: [name] MRN: [id number] DOB: 03-25-1932  Referred by: Tower, Wynelle Fanny, MD  Patient had a phone appointment scheduled with clinical pharmacist today.  An unsuccessful telephone outreach was attempted today. The patient was referred to the pharmacist for assistance with medications, care management and care coordination.   Patient will NOT be penalized in any way for missing a CCM appointment. The no-show fee does not apply.  If possible, a message was left to return call to: [phone] or to [facility].  Charlene Brooke, PharmD, BCACP Clinical Pharmacist [facility] [phone]

## 2021-04-30 ENCOUNTER — Telehealth: Payer: Self-pay

## 2021-04-30 NOTE — Telephone Encounter (Signed)
Spoke with patient and advised [facility] and there is no recall on the tablets/capsules but the oral solution. Patient understood and will continue taking her medication  FYI to Dr Glori Bickers   Client [facility] Night - Client Client Site [place] Provider [name] - MD Contact Type Call Who Is Calling Patient / Member / Family / Caregiver Caller Name Rosa Sanchez Phone Number [phone] Patient Name [name] Patient DOB 07-11-1932 Call Type Message Only Information Provided Reason for Call Request for General Office Information Initial Comment Caller states her prescription levothyroxine is now recalled and she has stopped taking it Additional Comment Office hours provided Disp. Time Disposition Final User 04/30/2021 11:08:14 AM General Information Provided Yes Lenis Noon Call Closed By: Lenis Noon Transaction Date/Time: 04/30/2021 11:04:29 AM (ET

## 2021-05-01 DIAGNOSIS — Z20822 Contact with and (suspected) exposure to covid-19: Secondary | ICD-10-CM | POA: Diagnosis not present

## 2021-05-02 DIAGNOSIS — Z20822 Contact with and (suspected) exposure to covid-19: Secondary | ICD-10-CM | POA: Diagnosis not present

## 2021-05-05 DIAGNOSIS — L821 Other seborrheic keratosis: Secondary | ICD-10-CM | POA: Diagnosis not present

## 2021-05-05 DIAGNOSIS — C44311 Basal cell carcinoma of skin of nose: Secondary | ICD-10-CM | POA: Diagnosis not present

## 2021-05-05 DIAGNOSIS — L820 Inflamed seborrheic keratosis: Secondary | ICD-10-CM | POA: Diagnosis not present

## 2021-05-05 DIAGNOSIS — L814 Other melanin hyperpigmentation: Secondary | ICD-10-CM | POA: Diagnosis not present

## 2021-05-05 DIAGNOSIS — L570 Actinic keratosis: Secondary | ICD-10-CM | POA: Diagnosis not present

## 2021-05-09 ENCOUNTER — Ambulatory Visit (INDEPENDENT_AMBULATORY_CARE_PROVIDER_SITE_OTHER): Payer: Medicare Other

## 2021-05-09 ENCOUNTER — Other Ambulatory Visit: Payer: Self-pay

## 2021-05-09 DIAGNOSIS — Z7901 Long term (current) use of anticoagulants: Secondary | ICD-10-CM

## 2021-05-09 LAB — POCT INR: INR: 2.2 (ref 2.0–3.0)

## 2021-05-09 NOTE — Progress Notes (Addendum)
Pt reported she has been taking 1 tablet daily, not the 1 tablet daily and 1/2 tablet on Wednesday as advised at last apt. So calendar has been updated and pt will continue with 1 tablet daily.  Continue 1 tablet daily. Recheck in 4 weeks.

## 2021-05-09 NOTE — Patient Instructions (Addendum)
Pre visit review using our clinic review tool, if applicable. No additional management support is needed unless otherwise documented below in the visit note.  Continue 1 tablet daily . Recheck in 4 weeks.  

## 2021-05-22 DIAGNOSIS — M3501 Sicca syndrome with keratoconjunctivitis: Secondary | ICD-10-CM | POA: Diagnosis not present

## 2021-05-23 ENCOUNTER — Telehealth: Payer: Self-pay

## 2021-05-23 NOTE — Progress Notes (Signed)
? ? ?Chronic Care Management ?Pharmacy Assistant  ? ?Name: [name]  MRN: [id number] DOB: October 03, 1932 ? ?Recent office visits:  ?03/27/2021 - [name], MD - Patient Message - Referral to Neurology due to [place], brain fog and pressure in head.  ?03/10/2021 Orrin Brigham, LPN - Patient presented for Annual Wellness Exam.  ?01/14/2021 - [name], MD - Patient presented for dizziness. Referral to Neurology.  ?11/27/2020 - [name], MD - Telephone - US Pelvis results: Fibroid. Referral to Gynecology. ?11/20/2020 [name], MD - Telephone - Change: levothyroxine (SYNTHROID) 75 MCG tablet - 75 mcg vs. 50 mcg.  ?11/20/2020 [name], MD - Patient presented for post-menopausal bleeding. Ordered: US Pelvic complete with transvaginal. Labs: INR ?  ?Recent consult visits:  ?04/24/2021 - [place] - Patient presented for cataract extraction, left.  ?04/09/2021 - [place] - Patient presented for cataract extraction, right.  ?02/26/2021 - [place] - Patient presented for left ovarian cyst.  ?02/24/2021 - Malachy Mood - Radiology - Patient presented for US Pelvis.  ?12/27/2020 - Charmian Muff, MD - Allergy - Patient presented for allergic rhinitis. Ordered: Allergy test. Start: hydrOXYzine (ATARAX/VISTARIL) 25 MG tablet. ?12/24/2020 - Alric Ran, MD - Neurology - Patient presented for abnormal sensation when she is near her oven range, magnetic oven range. No medication changes.  ?12/17/2020 - Malachy Mood, MD - OBGYN - Patient presented for ovarian cyst. Ordered: US Pelvic with Transvaginal.  ?11/21/2020 - Radiology - Patient presented for US Pelvis.  ?11/19/2020 - Virl Axe, MD - Cardiology - Patient presented for permanent atrial fibrillation. Labs: EKG and Pacemaker external.  ?  ?Anti-Coag Visits: ?05/09/2021 - INR: 2.7 - Continue 1 tablet daily.  ?04/25/2021 - INR: 3.7 - Hold dose tomorrow and then change weekly dose to take 1 tablet  daily except take 1/2 tablet on Wednesdays.  ?03/28/2021 - INR: 2.7 - Continue 1 tablet daily except take 1 1/2 tablets on Wednesdays.  ?02/21/2021 - INR: 2.5 - Continue 1 tablet daily except take 1 1/2 tablets on Wednesdays. ?01/17/2021 - INR: 2.7 - Continue 1 tablet daily except take 1 1/2 tablets on Wednesdays. ?01/03/2021 - INR 1.8 - Increase dose today to 2 tablet and then change weekly dose to take 1 tablet daily except take 1 1/2 tablets on Wednesdays. ?12/20/2020 - INR: 1.6 - Increase dose today to 1 tablet and increase dose tomorrow to 1 1/2 tablets and then change weekly dose to take 1 tablet daily. ?11/20/2020 - INR: 1.7 - Increase dose today to 7.5mg  and then continue 5mg  daily except 2.5mg  on Mondays and Fridays. ?10/24/2020 - INR: 2.2 - Continue 5mg  daily except 2.5mg  on Mondays and Fridays.  ?  ?Hospital visits:  ?Medication Reconciliation was completed by comparing discharge summary, patient?s EMR and Pharmacy list, and upon discussion with patient. ?  ?Admitted to the hospital on 03/02/2021 due to blurry vision. Discharge date was 03/03/2021 Discharged from [facility].   ?   ?Final Diagnoses:  ?Blurry vision, bilateral ?  ?New?Medications Started at [facility] Discharge:?? ?None noted ?  ?Medication Changes at Hospital Discharge: ?None noted ?  ?Medications Discontinued at Hospital Discharge: ?None noted ?  ?Medications that remain the same after Hospital Discharge:??  ?-All other medications will remain the same.    ?  ?  ?Medication Reconciliation was completed by comparing discharge summary, patient?s EMR and Pharmacy list, and upon discussion with patient. ?  ?Admitted to the hospital on 11/05/2020  due to altered mental state. Discharge date was 11/06/2020. Discharged from [facility].   ?  ?Final Diagnoses:  ?Transient alteration of awareness ?  ?New?Medications Started at [facility] Discharge:?? ?None noted ?  ?Medication Changes at Hospital Discharge: ?None noted ?   ?Medications Discontinued at Hospital Discharge: ?None noted ?  ?Medications that remain the same after Hospital Discharge:??  ?-All other medications will remain the same.   ?  ? ?Medications: ?Outpatient Encounter Medications as of 05/23/2021  ?Medication Sig  ? acetaminophen (TYLENOL) 500 MG tablet Take 500 mg by mouth every 6 (six) hours as needed for moderate pain.  ? albuterol (VENTOLIN HFA) 108 (90 Base) MCG/ACT inhaler Inhale into the lungs every 6 (six) hours as needed for wheezing or shortness of breath.  ? alendronate (FOSAMAX) 70 MG tablet TAKE 1 TABLET EVERY 7 DAYS. TAKE WITH A FULL GLASS OF WATER ON AN EMPTY STOMACH. (Patient not taking: Reported on 04/01/2021)  ? cetirizine (ZYRTEC) 10 MG tablet Take 10 mg by mouth daily as needed for allergies.  ? diclofenac Sodium (VOLTAREN) 1 % GEL Apply 1 application topically 4 (four) times daily as needed (pain). (Patient not taking: Reported on 04/01/2021)  ? diphenhydrAMINE-zinc acetate (BENADRYL) cream Apply 1 application topically 3 (three) times daily as needed for itching.  ? ferrous sulfate 325 (65 FE) MG EC tablet TAKE 1 TABLET EVERY DAY WITH BREAKFAST  ? fluticasone (FLONASE) 50 MCG/ACT nasal spray Place 1 spray into both nostrils daily as needed for allergies.  ? furosemide (LASIX) 40 MG tablet Take 40 mg by mouth daily as needed for edema.  ? hydrOXYzine (ATARAX/VISTARIL) 25 MG tablet Take 1 tablet (25 mg total) by mouth every 6 (six) hours as needed for itching.  ? Ketotifen Fumarate (EYE ITCH RELIEF OP) Place 1 drop into both eyes daily as needed (allergies).  ? levothyroxine (SYNTHROID) 75 MCG tablet Take 1 tablet (75 mcg total) by mouth daily before breakfast.  ? Polyethyl Glycol-Propyl Glycol 0.4-0.3 % SOLN Place 1-2 drops into both eyes 3 (three) times daily as needed (for dry eyes.).  ? warfarin (COUMADIN) 5 MG tablet TAKE 1 TABLET BY MOUTH DAILY EXCEPT TAKE 1 AND 1/2 TABLETS ON WEDNESDAYS OR AS DIRECTED BY ANTICOAGULATION CLINIC  ? ?No  facility-administered encounter medications on file as of 05/23/2021.  ? ?Patient contacted to review initial questions prior to visit with Charlene Brooke. ?  ?Have you seen any other providers since your last visit with PCP? Yes ?  ?Any changes in your medications or health? Yes - Patient had cataract surgery on 01/18 and 02/02. Patient states she is doing well.  ?  ?Any side effects from any medications? No ?  ?Do you have an symptoms or problems not managed by your medications? Yes - See below ?  ?Any concerns about your health right now? Yes - Patient is having issues with being around electrical devices. If patient goes near her stove (she had to get a new one) she will feel like someone is holding on to her shoulders and pushing her down. Patient also described it as being a little kid and spinning until you are dizzy when you stop spinning. Patient is going to [facility] in June to address the problem. Patient is having a difficult time as she can not be close to any bid electrical device.  The issue is being caused by her Pacemaker.  ?  ?Has your provider asked that you check blood pressure, blood sugar, or follow special  diet at home? No  ?  ?Do you get any type of exercise on a regular basis? Yes - Patient will walk and swim when weather permits.  ?  ?Can you think of a goal you would like to reach for your health? Yes - Find out why she keeps having issues around strong electrical devices. She has an appointment at [facility] in June.  ?  ?Do you have any problems getting your medications? No ?  ?Is there anything that you would like to discuss during the appointment? No ?  ?Spoke with patient and reminded them to have all medications, supplements and any blood glucose and blood pressure readings available for review with pharmacist, at their telephone visit on 05/27/2021 at 9:30 am.   ?  ?Star Rating Drugs:  ?Medication:                Last Fill:         Day Supply ?No star rating drugs noted ?  ?Care Gaps: ?Annual  wellness visit in last year? Yes 03/10/2021 ?Most Recent BP reading: 124/77 on 04/23/2021 ?  ?Charlene Brooke, CPP notified ?  ?Marijean Niemann, RMA ?Clinical Pharmacy Assistant ?[phone] ?  ?Time Spent:  20 Minutes ?

## 2021-05-27 ENCOUNTER — Telehealth: Payer: Self-pay | Admitting: Pharmacist

## 2021-05-27 ENCOUNTER — Telehealth: Payer: Self-pay | Admitting: *Deleted

## 2021-05-27 ENCOUNTER — Ambulatory Visit (INDEPENDENT_AMBULATORY_CARE_PROVIDER_SITE_OTHER): Payer: Medicare Other | Admitting: Pharmacist

## 2021-05-27 ENCOUNTER — Other Ambulatory Visit: Payer: Self-pay

## 2021-05-27 DIAGNOSIS — I4821 Permanent atrial fibrillation: Secondary | ICD-10-CM

## 2021-05-27 DIAGNOSIS — E7849 Other hyperlipidemia: Secondary | ICD-10-CM

## 2021-05-27 DIAGNOSIS — M8589 Other specified disorders of bone density and structure, multiple sites: Secondary | ICD-10-CM

## 2021-05-27 DIAGNOSIS — E039 Hypothyroidism, unspecified: Secondary | ICD-10-CM

## 2021-05-27 DIAGNOSIS — F418 Other specified anxiety disorders: Secondary | ICD-10-CM

## 2021-05-27 DIAGNOSIS — F4321 Adjustment disorder with depressed mood: Secondary | ICD-10-CM

## 2021-05-27 DIAGNOSIS — J4520 Mild intermittent asthma, uncomplicated: Secondary | ICD-10-CM

## 2021-05-27 NOTE — Telephone Encounter (Signed)
Please schedule appt with me  ?Will do labs that day most likely ?

## 2021-05-27 NOTE — Chronic Care Management (AMB) (Signed)
?  Chronic Care Management  ? ?Note ? ?05/27/2021 ?Name: [name] MRN: [id number] DOB: [birth year] ? ?[name] is a 88 y.o. year old female who is a primary care patient of Tower, Wynelle Fanny, MD. [name] is currently enrolled in care management services. An additional referral for Licensed Clinical SW was placed.  ? ?Follow up plan: ?Telephone appointment with care management team member scheduled for: 06/06/2021 ? ?Koltan Portocarrero, CCMA ?Care Guide, Embedded Care Coordination ?[place]  Care Management  ?Direct Dial: [phone] ? ? ?

## 2021-05-27 NOTE — Telephone Encounter (Signed)
Patient reports persistent and intensifying salt cravings lately. She looked this up and found it can be a symptom of Addison's disease. We discussed rarity of Addison's and low likelihood of developing this late in life. We also discussed more common causes of salt cravings (dehydration, stress, sodium deficiency) - she reports she has been drinking ~90 oz of water per day. If she is over-hydrating she may actually be diluting sodium leading to hypervolemic hyponatremia ? ?Patient would still like to tested for Addisons/hormone imbalances. She would like to set up appt with PCP to discuss this. I did recommend evaluation by PCP to check Na levels. ?

## 2021-05-27 NOTE — Patient Instructions (Signed)
Visit Information  Phone number for Pharmacist: [phone]  Thank you for meeting with me to discuss your medications! I look forward to working with you to achieve your health care goals. Below is a summary of what we talked about during the visit:   Goals Addressed             This Visit's Progress    Manage My Medicine       Timeframe:  Long-Range Goal Priority:  Medium Start Date:     05/27/21                        Expected End Date: 05/28/22                     Follow Up Date March 2024   - call for medicine refill 2 or 3 days before it runs out - call if I am sick and can't take my medicine - keep a list of all the medicines I take; vitamins and herbals too    Why is this important?   These steps will help you keep on track with your medicines.   Notes:         Care Plan : CCM Pharmacy Care Plan  Updates made by Charlton Haws, RPH since 05/27/2021 12:00 AM     Problem: Hyperlipidemia, Atrial Fibrillation, Asthma, Hypothyroidism, Depression, Anxiety, Osteopenia, and Allergic Rhinitis   Priority: High     Long-Range Goal: Disease mgmt   Start Date: 05/27/2021  Expected End Date: 05/28/2022  This Visit's Progress: On track  Priority: High  Note:   Current Barriers:  Unable to independently monitor therapeutic efficacy  Pharmacist Clinical Goal(s):  Patient will achieve adherence to monitoring guidelines and medication adherence to achieve therapeutic efficacy through collaboration with PharmD and provider.   Interventions: 1:1 collaboration with Tower, Wynelle Fanny, MD regarding development and update of comprehensive plan of care as evidenced by provider attestation and co-signature Inter-disciplinary care team collaboration (see longitudinal plan of care) Comprehensive medication review performed; medication list updated in electronic medical record  Hyperlipidemia: (LDL goal < 70) -Not ideally controlled - LDL is above goal, pt did not tolerate statins or  ezetimibe -Hx stroke 04/2019. Candidate for PCSK9 per cardiology. -Current treatment: None -Medications previously tried: ezetimibe, statins (rash) -Educated on Cholesterol goals;  -Recommend follow up with cardiology  Pedal edema (Goal: manage symptoms) -Controlled - pt takes furosemide infrequently for swelling -Current treatment  Furosemide 40 mg daily - Appropriate, Effective, Safe, Accessible -Medications previously tried: n/a -Recommended to continue current medication  Atrial Fibrillation (Goal: prevent stroke and major bleeding) -Controlled -Home BP and HR readings: n/a  -CHADSVASC: 7; hx of CVA; hx rectal bleeding -07/2019 AV ablation and pacemaker. -Warfarin per PCP clinic -Current treatment: Warfarin 5 mg AD - Appropriate, Effective, Safe, Accessible -Medications previously tried: diltiazem, digoxin, dofetilide, Eliquis, amiodarone -Counseled on increased risk of stroke due to Afib and benefits of anticoagulation for stroke prevention; -Recommended to continue current medication  Asthma (Goal: control symptoms and prevent exacerbations) -Controlled - pt rarely need albuterol -Current treatment  Albuterol HFA prn - Appropriate, Effective, Safe, Accessible -Medications previously tried: n/a  -Pulmonary function testing: not on file -Exacerbations requiring treatment in last 6 months: 0 -Frequency of rescue inhaler use: rare -Counseled on When to use rescue inhaler -Recommended to continue current medication -Try nasal spray BID to help with postnasal drip/congestion  Allergic rhinitis (Goal: manage symptoms) -Not ideally controlled -  pt reports persistent nasal congestion that is worse in the morning, and frequent throat-clearing that is bothersome, she believes this is related to post-nasal drip; she reports allergy medications make her very drowsy and she likes to avoid them if she can -Current treatment  Cetirizine 10 mg PRN - Appropriate, Effective, Safe,  Accessible Budesonide nasal spray PRN -Appropriate, Effective, Safe, Accessible -Medications previously tried: fluticasone nasal spray - cost? -Counseled on benefits of steroid nasal spray for decongestation and post-nasal drip  -Recommended daily use of steroid nasal spray, can take cetirizine at night when symptoms are severe  Depression/Anxiety (Goal: manage symptoms) -Not ideally controlled -Loss of her son 09-26-20 (found dead in his room); also lost her daughter and husband suddenly in the past, which has led to anxiety about her remaining children/family dying suddenly; she does not want to take antidepressants -PHQ9: 0 (02/2021) - no/minimal depression -GAD7: not on file -Connected with PCP for mental health support -Current treatment: Hydroxyzine 25 mg PRN (itching) - not using -Medications previously tried/failed: none -Educated on Benefits of cognitive-behavioral therapy with or without medication; pt agreed it may be time to try counseling -Referral to LCSW for counseling/grief - Christian counselor preferred  Osteopenia (Goal prevent fractures) -Controlled -Last DEXA Scan: 04/25/2019 (improved from previous 2018); recheck 2 years  T-Score femoral neck: -1.6  T-Score lumbar spine: -1.1 -Current treatment  Alendronate 70 mg weekly 09-26-2017 - present) - Appropriate, Effective, Safe, Accessible Vitamin D occasionally -Medications previously tried: n/a  -Recommended to continue current medication  Hypothyroidism (Goal: maintain TSH in goal range) -Controlled - TSH 3.06; pt is taking med first thing in AM -Current treatment  Levothyroxine 75 mcg daily - Appropriate, Effective, Safe, Accessible -Medications previously tried: n/a  -Recommended to continue current medication  Salt cravings (Goal: evaluate causes) -Pt reports lately salt cravings have intensified, she looked this up and is concerned about Addison's disease - we discussed rarity of Addisons and low odds of  developing this late in life, pt would still like to be tested for Addisons/hormone imbalances -Also discussed more likely causes of salt craving - dehydration, stress, hyponatremia. Pt reports she has recently made an effort to increase fluid intake and is drinking ~90 oz of water daily. It is possible she is over-hydrating, leading to hypervolemic hyponatremia and salt cravings. -Recommend evaluation by PCP - check Na. Pt requests Addison's testing.  Health Maintenance -Vaccine gaps: Shingrix -Current therapy:  Tylenol 500 mg PRN Voltaren gel PRN Benadryl cream PRN Ferrous sulfate 325 mg daily Ketotifen eye drops Vitamin C occasionally -Patient is satisfied with current therapy and denies issues -Recommended to continue current medication  Patient Goals/Self-Care Activities Patient will:  - take medications as prescribed as evidenced by patient report and record review focus on medication adherence by routine -Try Steroid nasal spray 1-2 times daily -Follow up with cardiology for cholesterol -Speak with social worker about counseling      [name] was given information about Chronic Care Management services today including:  CCM service includes personalized support from designated clinical staff supervised by her physician, including individualized plan of care and coordination with other care providers 24/7 contact phone numbers for assistance for urgent and routine care needs. Standard insurance, coinsurance, copays and deductibles apply for chronic care management only during months in which we provide at least 20 minutes of these services. Most insurances cover these services at 100%, however patients may be responsible for any copay, coinsurance and/or deductible if applicable. This service may help  you avoid the need for more expensive face-to-face services. Only one practitioner may furnish and bill the service in a calendar month. The patient may stop CCM services at any  time (effective at the end of the month) by phone call to the office staff.  Patient agreed to services and verbal consent obtained.   Patient verbalizes understanding of instructions and care plan provided today and agrees to view in [place]. Active MyChart status confirmed with patient.   Telephone follow up appointment with pharmacy team member scheduled for: 1 year  Charlene Brooke, PharmD, [facility] Clinical Pharmacist [facility] [phone]

## 2021-05-27 NOTE — Progress Notes (Signed)
Chronic Care Management Pharmacy Note  05/27/2021 Name:  NATALEA SUTLIFF MRN:  [id number] DOB:  10-17-1932  Summary: CCM Initial visit -Pt reports compliance with medications as prescribed -Pt reports persistent nasal congestion and post-nasal drip leading to bothersome throat-clearing/cough. She is not taking cetirizine or nasal spray on a regular basis -Pt reports significant grief/depression since losing her son last summer; she has also lost several other family members (daughter, husband) suddenly in the past and has not had counseling for this. She is open to speaking to a counselor, preferably a Marketing executive -Pt reports significant salt cravings that she read can be a sign of Addison's disease; she want to be tested for Addison's/hormone imbalances. We discussed rarity of Addisons and low odds of developing this late in life. Also discussed more likely causes of salt cravings - dehydration, hyponatremia. She reports she has made an effort to increase fluid intake and is drinking 90 oz water per day - if she is overhydrating this could lead to hypervolemic hyponatremia.  Recommendations/Changes made from today's visit: -Advised to use Flonase 1-2 times daily until sx improve; if no improvement contact us -Referred to CCM LCSW for counseling -Referring to PCP re: Addisons/hormone testing and check Na  Plan: -[place] will call patient 6 months -Pharmacist follow up televisit scheduled for 1 year    Subjective: NEHA WAIGHT is an 88 y.o. year old female who is a primary patient of Tower, Wynelle Fanny, MD.  The CCM team was consulted for assistance with disease management and care coordination needs.    Engaged with patient by telephone for initial visit in response to provider referral for pharmacy case management and/or care coordination services.   Consent to Services:  The patient was given the following information about Chronic Care Management services today, agreed  to services, and gave verbal consent: 1. CCM service includes personalized support from designated clinical staff supervised by the primary care provider, including individualized plan of care and coordination with other care providers 2. 24/7 contact phone numbers for assistance for urgent and routine care needs. 3. Service will only be billed when office clinical staff spend 20 minutes or more in a month to coordinate care. 4. Only one practitioner may furnish and bill the service in a calendar month. 5.The patient may stop CCM services at any time (effective at the end of the month) by phone call to the office staff. 6. The patient will be responsible for cost sharing (co-pay) of up to 20% of the service fee (after annual deductible is met). Patient agreed to services and consent obtained.  Patient Care Team: Tower, Wynelle Fanny, MD as PCP - General Deboraha Sprang, MD as PCP - Cardiology (Cardiology) Charlton Haws, [facility] as Pharmacist (Pharmacist)  Recent office visits: 03/27/2021 - [name], MD - Patient Message - Referral to Neurology due to [place], brain fog and pressure in head.  03/10/2021 Orrin Brigham, LPN - Patient presented for Annual Wellness Exam.  01/14/2021 - [name], MD - Patient presented for dizziness. Referral to Neurology.  11/27/2020 - [name], MD - Telephone - US Pelvis results: Fibroid. Referral to Gynecology. 11/20/2020 [name], MD - Telephone - Change: levothyroxine (SYNTHROID) 75 MCG tablet - 75 mcg vs. 50 mcg.  11/20/2020 - [name], MD - Patient presented for post-menopausal bleeding. Ordered: US Pelvic complete with transvaginal. Labs: INR  Recent consult visits: 04/24/2021 - [place] - Patient presented for cataract extraction,  left.  04/09/2021 - [place] - Patient presented for cataract extraction, right.  02/26/2021 - [place] - Patient presented for left ovarian cyst.  02/24/2021 -  Malachy Mood - Radiology - Patient presented for US Pelvis.  12/27/2020 - Charmian Muff, MD - Allergy - Patient presented for allergic rhinitis. Ordered: Allergy test. Start: hydrOXYzine (ATARAX/VISTARIL) 25 MG tablet. 12/24/2020 - Alric Ran, MD - Neurology - Patient presented for abnormal sensation when she is near her oven range, magnetic oven range. No medication changes.  12/17/2020 - Malachy Mood, MD - OBGYN - Patient presented for ovarian cyst. Ordered: US Pelvic with Transvaginal.  11/21/2020 - Radiology - Patient presented for US Pelvis.  11/19/2020 - Virl Axe, MD - Cardiology - Patient presented for permanent atrial fibrillation. Labs: EKG and Pacemaker external.     Hospital visits: Medication Reconciliation was completed by comparing discharge summary, patients EMR and Pharmacy list, and upon discussion with patient.   Admitted to the hospital on 03/02/2021 due to blurry vision. Discharge date was 03/03/2021 Discharged from [facility].     Medications that remain the same after Hospital Discharge:??  -All other medications will remain the same.      Admitted to the hospital on 11/05/2020 due to altered mental state. Discharge date was 11/06/2020. Discharged from [facility].     Final Diagnoses:  Transient alteration of awareness   Medications that remain the same after Hospital Discharge:??  -All other medications will remain the same.     Objective:  Lab Results  Component Value Date   CREATININE 0.78 03/02/2021   BUN 24 (H) 03/02/2021   GFR 68.24 03/04/2020   GFRNONAA >60 03/02/2021   GFRAA 92 05/13/2020   NA 136 03/02/2021   K 3.7 03/02/2021   CALCIUM 9.3 03/02/2021   CO2 27 03/02/2021   GLUCOSE 170 (H) 03/02/2021    Lab Results  Component Value Date/Time   HGBA1C 5.6 03/04/2020 08:19 AM   HGBA1C 5.8 (H) 06/17/2019 04:32 AM   GFR 68.24 03/04/2020 08:19 AM   GFR 64.62 09/08/2018 08:59 AM    Last diabetic Eye  exam: No results found for: HMDIABEYEEXA  Last diabetic Foot exam: No results found for: HMDIABFOOTEX   Lab Results  Component Value Date   CHOL 165 12/20/2020   HDL 46.30 12/20/2020   LDLCALC 105 (H) 12/20/2020   LDLDIRECT 203.8 01/09/2008   TRIG 71.0 12/20/2020   CHOLHDL 4 12/20/2020    Hepatic Function Latest Ref Rng & Units 03/02/2021 11/05/2020 03/04/2020  Total Protein 6.5 - 8.1 g/dL 7.4 6.9 6.7  Albumin 3.5 - 5.0 g/dL 3.9 3.8 3.9  AST 15 - 41 U/L 24 39 18  ALT 0 - 44 U/L 18 48(H) 12  Alk Phosphatase 38 - 126 U/L 42 46 44  Total Bilirubin 0.3 - 1.2 mg/dL 0.7 0.6 0.5  Bilirubin, Direct 0.0 - 0.3 mg/dL - - -    Lab Results  Component Value Date/Time   TSH 3.06 12/20/2020 08:12 AM   TSH 5.131 (H) 11/06/2020 05:34 AM   TSH 3.41 04/23/2020 09:08 AM   FREET4 0.90 09/14/2013 08:33 AM    CBC Latest Ref Rng & Units 03/28/2021 03/02/2021 12/20/2020  WBC 4.0 - 10.5 K/uL 5.0 5.1 4.0  Hemoglobin 12.0 - 15.0 g/dL 12.3 13.6 13.0  Hematocrit 36.0 - 46.0 % 37.5 41.3 40.6  Platelets 150 - 400 K/uL 151 164 131.0(L)   Lab Results  Component Value Date/Time   INR  2.2 05/09/2021 12:00 AM   INR 3.7 (A) 04/25/2021 12:00 AM   INR 2.6 (H) 03/02/2021 03:27 PM   INR 1.3 (H) 03/04/2020 08:19 AM   INR 1.7 06/26/2013 10:20 PM   INR 1.6 02/28/2013 11:10 AM    CHA2DS2/VAS Stroke Risk Points  Current as of 35 minutes ago     7 >= 2 Points: High Risk  1 - 1.99 Points: Medium Risk  0 Points: Low Risk    Last Change: N/A     Points Metrics  1 Has Congestive Heart Failure:  Yes    Current as of 35 minutes ago  0 Has Vascular Disease:  No    Current as of 35 minutes ago  1 Has Hypertension:  Yes    Current as of 35 minutes ago  2 Age:  62    Current as of 35 minutes ago  0 Has Diabetes:  No    Current as of 35 minutes ago  2 Had Stroke:  Yes  Had TIA:  No  Had Thromboembolism:  No    Current as of 35 minutes ago  1 Female:  Yes    Current as of 35 minutes ago        No results found  for: VD25OH  Clinical ASCVD: Yes  The ASCVD Risk score (Arnett DK, et al., 2019) failed to calculate for the following reasons:   The 2019 ASCVD risk score is only valid for ages 88 to 64   The patient has a prior MI or stroke diagnosis    Depression screen [facility] 2/9 03/10/2021 03/08/2020 09/05/2018  Decreased Interest 0 0 0  Down, Depressed, Hopeless 0 0 0  PHQ - 2 Score 0 0 0  Altered sleeping - 0 0  Tired, decreased energy - 0 0  Change in appetite - 0 0  Feeling bad or failure about yourself  - 0 0  Trouble concentrating - 0 0  Moving slowly or fidgety/restless - 0 0  Suicidal thoughts - 0 0  PHQ-9 Score - 0 0  Difficult doing work/chores - Not difficult at all Not difficult at all  Some recent data might be hidden     CHA2DS2/VAS Stroke Risk Points  Current as of 55 minutes ago     7 >= 2 Points: High Risk  1 - 1.99 Points: Medium Risk  0 Points: Low Risk    Last Change: N/A      Details    This score determines the patient's risk of having a stroke if the  patient has atrial fibrillation.     Points Metrics  1 Has Congestive Heart Failure:  Yes    Current as of 55 minutes ago  0 Has Vascular Disease:  No    Current as of 55 minutes ago  1 Has Hypertension:  Yes    Current as of 55 minutes ago  2 Age:  54    Current as of 55 minutes ago  0 Has Diabetes:  No    Current as of 55 minutes ago  2 Had Stroke:  Yes  Had TIA:  No  Had Thromboembolism:  No    Current as of 55 minutes ago  1 Female:  Yes    Current as of 55 minutes ago     Social History   Tobacco Use  Smoking Status Never  Smokeless Tobacco Never   BP Readings from Last 3 Encounters:  04/23/21 124/77  04/09/21 (!) 144/72  03/02/21  132/78   Pulse Readings from Last 3 Encounters:  04/23/21 68  04/09/21 69  03/02/21 84   Wt Readings from Last 3 Encounters:  04/23/21 160 lb (72.6 kg)  04/09/21 162 lb (73.5 kg)  03/10/21 159 lb (72.1 kg)   BMI Readings from Last 3 Encounters:  04/23/21  27.46 kg/m  04/09/21 27.81 kg/m  03/10/21 27.29 kg/m    Assessment/Interventions: Review of patient past medical history, allergies, medications, health status, including review of consultants reports, laboratory and other test data, was performed as part of comprehensive evaluation and provision of chronic care management services.   SDOH:  (Social Determinants of Health) assessments and interventions performed: No - evaluated Dec 2022 at [facility]   Alcohol Screen: Low Risk    Last Alcohol Screening Score (AUDIT): 0  Depression (PHQ2-9): Low Risk    PHQ-2 Score: 0  Financial Resource Strain: Low Risk    Difficulty of Paying Living Expenses: Not hard at all  Food Insecurity: No Food Insecurity   Worried About Charity fundraiser in the Last Year: Never true   Ran Out of Food in the Last Year: Never true  Housing: Low Risk    Last Housing Risk Score: 0  Physical Activity: Inactive   Days of Exercise per Week: 0 days   Minutes of Exercise per Session: 0 min  Social Connections: Moderately Integrated   Frequency of Communication with Friends and Family: More than three times a week   Frequency of Social Gatherings with Friends and Family: Three times a week   Attends Religious Services: More than 4 times per year   Active Member of Clubs or Organizations: Yes   Attends Archivist Meetings: More than 4 times per year   Marital Status: Widowed  Stress: No Stress Concern Present   Feeling of Stress : Only a little  Tobacco Use: Low Risk    Smoking Tobacco Use: Never   Smokeless Tobacco Use: Never   Passive Exposure: Not on file  Transportation Needs: No Transportation Needs   Lack of Transportation (Medical): No   Lack of Transportation (Non-Medical): No    CCM Care Plan  Allergies  Allergen Reactions   Amiodarone Swelling    SWELLING REACTION UNSPECIFIED    Penicillins Hives and Rash    Has patient had a PCN reaction causing immediate rash,  facial/tongue/throat swelling, SOB or lightheadedness with hypotension: No Has patient had a PCN reaction causing severe rash involving mucus membranes or skin necrosis: No Has patient had a PCN reaction that required hospitalization:Patient was inpatient when reaction occurred Has patient had a PCN reaction occurring within the last 10 years: No If all of the above answers are "NO", then may proceed with Cephalosporin use   Amiodarone Hcl Swelling    SWELLING REACTION UNSPECIFIED    Statins Rash    REACTION: rash    Medications Reviewed Today     Reviewed by Charlton Haws, [facility] (Pharmacist) on 05/27/21 at [place] List Status: <None>   Medication Order Taking? Sig Documenting Provider Last Dose Status Informant  acetaminophen (TYLENOL) 500 MG tablet [id number] Yes Take 500 mg by mouth every 6 (six) hours as needed for moderate pain. [provider] Taking Active   albuterol (VENTOLIN HFA) 108 (90 Base) MCG/ACT inhaler [id number] Yes Inhale into the lungs every 6 (six) hours as needed for wheezing or shortness of breath. [provider] Taking Active   alendronate (FOSAMAX) 70 MG tablet [id number]  Yes TAKE 1 TABLET EVERY 7 DAYS. TAKE WITH A FULL GLASS OF WATER ON AN EMPTY STOMACH. Tower, Wynelle Fanny, MD Taking Active   budesonide (GNP BUDESONIDE NASAL SPRAY) 32 MCG/ACT nasal spray [id number] Yes Place into both nostrils daily. [provider] Taking Active   cetirizine (ZYRTEC) 10 MG tablet [id number] Yes Take 10 mg by mouth daily as needed for allergies. [provider] Taking Active   Patient not taking:  Discontinued 05/27/21 1201 (Patient Preference) diphenhydrAMINE-zinc acetate (BENADRYL) cream [id number]  Apply 1 application topically 3 (three) times daily as needed for itching. [provider]  Active Self  ferrous sulfate 325 (65 FE) MG EC tablet [id number] Yes TAKE 1 TABLET EVERY DAY WITH BREAKFAST Tower, Wynelle Fanny, MD Taking Active   furosemide  (LASIX) 40 MG tablet [id number] Yes Take 40 mg by mouth daily as needed for edema. [provider] Taking Active   hydrOXYzine (ATARAX/VISTARIL) 25 MG tablet [id number] Yes Take 1 tablet (25 mg total) by mouth every 6 (six) hours as needed for itching. Kennith Gain, MD Taking Active   levothyroxine (SYNTHROID) 75 MCG tablet [id number] Yes Take 1 tablet (75 mcg total) by mouth daily before breakfast. Tower, Wynelle Fanny, MD Taking Active   Polyethyl Glycol-Propyl Glycol 0.4-0.3 % SOLN [id number] Yes Place 1-2 drops into both eyes 3 (three) times daily as needed (for dry eyes.). [provider] Taking Active Self  warfarin (COUMADIN) 5 MG tablet [id number] Yes TAKE 1 TABLET BY MOUTH DAILY EXCEPT TAKE 1 AND 1/2 TABLETS ON WEDNESDAYS OR AS DIRECTED BY ANTICOAGULATION CLINIC Tower, Wynelle Fanny, MD Taking Active             Patient Active Problem List   Diagnosis Date Noted   Brain fog 01/14/2021   Pressure in head 01/14/2021   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/20/2020   Post-menopausal bleeding 11/20/2020   Facial paresthesia 10/14/2020   Insect bite 09/20/2020   Grief reaction 09/20/2020   Hearing loss 06/13/2020   Atrial fibrillation ([place]) 04/24/2020   Pelvic pain 03/11/2020   Routine general medical examination at a health care facility 03/11/2020   Elevated glucose 03/03/2020   Rectal bleeding 02/18/2020   History of TIA (transient ischemic attack) 02/18/2020   S/P AV nodal ablation 07/28/19 07/29/2019   S/P placement of cardiac pacemaker MDT 07/28/19 07/29/2019   AV block 07/28/2019   CVA (cerebral vascular accident) ([place]) 06/16/2019   Facial tingling 11/29/2018   Tremor of left hand 11/29/2018   Medicare annual wellness visit, subsequent 11/24/2018   Dysuria 02/06/2018   Dizzy 02/04/2018   Iron deficiency anemia 10/21/2017   Rapid atrial fibrillation ([place]) 08/19/2017   Constipation 08/02/2017   Numbness and tingling 07/14/2017   Paresthesia 07/14/2017   Unilateral  primary osteoarthritis, left hip 05/04/2017   Status post total replacement of left hip 05/04/2017   Hip osteoarthritis 04/27/2017   Long term (current) use of anticoagulants 03/04/2017   Venous stasis dermatitis of both lower extremities 01/08/2017   Impacted cerumen of right ear 11/20/2016   Osteopenia 10/25/2016   Pedal edema 08/26/2016   Varicose veins of both lower extremities 08/26/2016   Estrogen deficiency 08/26/2016   Screening mammogram, encounter for 08/26/2016   Hemorrhoids 08/26/2016   History of nonmelanoma skin cancer 01/01/2016   Pruritus 10/04/2015   Urticaria 08/22/2014   Hip pain 08/02/2014   Left knee pain 08/02/2014   Chronic cough 05/08/2014   Hematochezia 04/09/2014   Colon cancer screening 08/16/2013   Encounter for therapeutic  drug monitoring 04/20/2013   Left ovarian cyst 03/14/2013   Palpitations 04/15/2012   COLONIC POLYPS, ADENOMATOUS, HX OF 09/18/2009   PULMONARY NODULE 12/20/2008   GANGLION CYST 10/04/2007   Mixed incontinence 04/28/2007   Hyperlipidemia 04/27/2007   Depression with anxiety 04/27/2007   Asthma, mild intermittent 04/27/2007   INSOMNIA 04/27/2007   ADENOMATOUS COLONIC POLYP 11/04/2006   Hypothyroidism 09/02/2006   Atrial fibrillation, chronic ([place]) 08/05/2006    Immunization History  Administered Date(s) Administered   Influenza,inj,Quad PF,6+ Mos 01/19/2017   Pneumococcal Conjugate-13 08/26/2016   Pneumococcal Polysaccharide-23 09/01/2017   Td 02/03/2005   Tdap 09/30/2020    Conditions to be addressed/monitored:  Hyperlipidemia, Atrial Fibrillation, Asthma, Hypothyroidism, Depression, Anxiety, Osteopenia, and Allergic Rhinitis  Care Plan : [place]  Updates made by Charlton Haws, [place] since 05/27/2021 12:00 AM     Problem: Hyperlipidemia, Atrial Fibrillation, Asthma, Hypothyroidism, Depression, Anxiety, Osteopenia, and Allergic Rhinitis   Priority: High     Long-Range Goal: Disease mgmt   Start  Date: 05/27/2021  Expected End Date: 05/28/2022  This Visit's Progress: On track  Priority: High  Note:   Current Barriers:  Unable to independently monitor therapeutic efficacy  Pharmacist Clinical Goal(s):  Patient will achieve adherence to monitoring guidelines and medication adherence to achieve therapeutic efficacy through collaboration with PharmD and provider.   Interventions: 1:1 collaboration with Tower, Wynelle Fanny, MD regarding development and update of comprehensive plan of care as evidenced by provider attestation and co-signature Inter-disciplinary care team collaboration (see longitudinal plan of care) Comprehensive medication review performed; medication list updated in electronic medical record  Hyperlipidemia: (LDL goal < 70) -Not ideally controlled - LDL is above goal, pt did not tolerate statins or ezetimibe -Hx stroke 04/2019. Candidate for PCSK9 per cardiology. -Current treatment: None -Medications previously tried: ezetimibe, statins (rash) -Educated on Cholesterol goals;  -Recommend follow up with cardiology  Pedal edema (Goal: manage symptoms) -Controlled - pt takes furosemide infrequently for swelling -Current treatment  Furosemide 40 mg daily - Appropriate, Effective, Safe, Accessible -Medications previously tried: n/a -Recommended to continue current medication  Atrial Fibrillation (Goal: prevent stroke and major bleeding) -Controlled -Home BP and HR readings: n/a  -CHADSVASC: 7; hx of CVA; hx rectal bleeding -07/2019 AV ablation and pacemaker. -Warfarin per PCP clinic -Current treatment: Warfarin 5 mg AD - Appropriate, Effective, Safe, Accessible -Medications previously tried: diltiazem, digoxin, dofetilide, Eliquis, amiodarone -Counseled on increased risk of stroke due to Afib and benefits of anticoagulation for stroke prevention; -Recommended to continue current medication  Asthma (Goal: control symptoms and prevent exacerbations) -Controlled - pt  rarely need albuterol -Current treatment  Albuterol HFA prn - Appropriate, Effective, Safe, Accessible -Medications previously tried: n/a  -Pulmonary function testing: not on file -Exacerbations requiring treatment in last 6 months: 0 -Frequency of rescue inhaler use: rare -Counseled on When to use rescue inhaler -Recommended to continue current medication -Try nasal spray BID to help with postnasal drip/congestion  Allergic rhinitis (Goal: manage symptoms) -Not ideally controlled - pt reports persistent nasal congestion that is worse in the morning, and frequent throat-clearing that is bothersome, she believes this is related to post-nasal drip; she reports allergy medications make her very drowsy and she likes to avoid them if she can -Current treatment  Cetirizine 10 mg PRN - Appropriate, Effective, Safe, Accessible Budesonide nasal spray PRN -Appropriate, Effective, Safe, Accessible -Medications previously tried: fluticasone nasal spray - cost? -Counseled on benefits of steroid nasal spray for decongestation and post-nasal drip  -Recommended  daily use of steroid nasal spray, can take cetirizine at night when symptoms are severe  Depression/Anxiety (Goal: manage symptoms) -Not ideally controlled -Loss of her son 09-08-20 (found dead in his room); also lost her daughter and husband suddenly in the past, which has led to anxiety about her remaining children/family dying suddenly; she does not want to take antidepressants -PHQ9: 0 (02/2021) - no/minimal depression -GAD7: not on file -Connected with PCP for mental health support -Current treatment: Hydroxyzine 25 mg PRN (itching) - not using -Medications previously tried/failed: none -Educated on Benefits of cognitive-behavioral therapy with or without medication; pt agreed it may be time to try counseling -Referral to LCSW for counseling/grief - Christian counselor preferred  Osteopenia (Goal prevent fractures) -Controlled -Last  DEXA Scan: 04/25/2019 (improved from previous 2018); recheck 2 years  T-Score femoral neck: -1.6  T-Score lumbar spine: -1.1 -Current treatment  Alendronate 70 mg weekly 08-Sep-2017 - present) - Appropriate, Effective, Safe, Accessible Vitamin D occasionally -Medications previously tried: n/a  -Recommended to continue current medication  Hypothyroidism (Goal: maintain TSH in goal range) -Controlled - TSH 3.06; pt is taking med first thing in AM -Current treatment  Levothyroxine 75 mcg daily - Appropriate, Effective, Safe, Accessible -Medications previously tried: n/a  -Recommended to continue current medication  Salt cravings (Goal: evaluate causes) -Pt reports lately salt cravings have intensified, she looked this up and is concerned about Addison's disease - we discussed rarity of Addisons and low odds of developing this late in life, pt would still like to be tested for Addisons/hormone imbalances -Also discussed more likely causes of salt craving - dehydration, stress, hyponatremia. Pt reports she has recently made an effort to increase fluid intake and is drinking ~90 oz of water daily. It is possible she is over-hydrating, leading to hypervolemic hyponatremia and salt cravings. -Recommend evaluation by PCP - check Na. Pt requests Addison's testing.  Health Maintenance -Vaccine gaps: Shingrix -Current therapy:  Tylenol 500 mg PRN Voltaren gel PRN Benadryl cream PRN Ferrous sulfate 325 mg daily Ketotifen eye drops Vitamin C occasionally -Patient is satisfied with current therapy and denies issues -Recommended to continue current medication  Patient Goals/Self-Care Activities Patient will:  - take medications as prescribed as evidenced by patient report and record review focus on medication adherence by routine -Try Steroid nasal spray 1-2 times daily -Follow up with cardiology for cholesterol -Speak with social worker about counseling      Medication Assistance: None  required.  Patient affirms current coverage meets needs.  Compliance/Adherence/Medication fill history: Care Gaps: None  Star-Rating Drugs: None  Patient's preferred pharmacy is:  [id number] [id number]: [phone]: [phone] [place] [id number]: [phone]: [phone] Uses pill box? No -   Pt endorses 100% compliance  We discussed: Current pharmacy is preferred with insurance plan and patient is satisfied with pharmacy services Patient decided to: Continue current medication management strategy  Care Plan and Follow Up Patient Decision:  Patient agrees to Care Plan and Follow-up.  Plan: Telephone follow up appointment with care management team member scheduled for:  1 year  LCharlene Brooke PharmD, BJefferson HealthcareClinical Pharmacist LOrchard [facility]Primary Care at [phone]

## 2021-06-03 DIAGNOSIS — C44311 Basal cell carcinoma of skin of nose: Secondary | ICD-10-CM | POA: Diagnosis not present

## 2021-06-03 DIAGNOSIS — Z85828 Personal history of other malignant neoplasm of skin: Secondary | ICD-10-CM | POA: Diagnosis not present

## 2021-06-05 ENCOUNTER — Other Ambulatory Visit: Payer: Self-pay

## 2021-06-05 ENCOUNTER — Ambulatory Visit (INDEPENDENT_AMBULATORY_CARE_PROVIDER_SITE_OTHER): Payer: Medicare Other

## 2021-06-05 DIAGNOSIS — Z7901 Long term (current) use of anticoagulants: Secondary | ICD-10-CM

## 2021-06-05 LAB — POCT INR: INR: 2.3 (ref 2.0–3.0)

## 2021-06-05 NOTE — Progress Notes (Signed)
Continue 1 tablet daily. Recheck in 4 weeks.   

## 2021-06-05 NOTE — Patient Instructions (Addendum)
Pre visit review using our clinic review tool, if applicable. No additional management support is needed unless otherwise documented below in the visit note.  Continue 1 tablet daily . Recheck in 4 weeks.  

## 2021-06-06 ENCOUNTER — Ambulatory Visit: Payer: Medicare Other | Admitting: *Deleted

## 2021-06-06 DIAGNOSIS — E039 Hypothyroidism, unspecified: Secondary | ICD-10-CM

## 2021-06-06 DIAGNOSIS — F4321 Adjustment disorder with depressed mood: Secondary | ICD-10-CM

## 2021-06-06 DIAGNOSIS — I4821 Permanent atrial fibrillation: Secondary | ICD-10-CM

## 2021-06-06 DIAGNOSIS — F418 Other specified anxiety disorders: Secondary | ICD-10-CM

## 2021-06-06 DIAGNOSIS — R4189 Other symptoms and signs involving cognitive functions and awareness: Secondary | ICD-10-CM

## 2021-06-09 ENCOUNTER — Ambulatory Visit: Payer: Medicare Other | Admitting: *Deleted

## 2021-06-09 DIAGNOSIS — F418 Other specified anxiety disorders: Secondary | ICD-10-CM

## 2021-06-09 DIAGNOSIS — E039 Hypothyroidism, unspecified: Secondary | ICD-10-CM

## 2021-06-09 DIAGNOSIS — F4321 Adjustment disorder with depressed mood: Secondary | ICD-10-CM

## 2021-06-09 DIAGNOSIS — F4320 Adjustment disorder, unspecified: Secondary | ICD-10-CM

## 2021-06-09 NOTE — Chronic Care Management (AMB) (Signed)
?Chronic Care Management  ? ? Clinical Social Work Note ? ?06/09/2021 ?Name: [name] MRN: [id number] DOB: Apr 22, 1932 ? ?[name] is a 88 y.o. year old female who is a primary care patient of Tower, Wynelle Fanny, MD. The CCM team was consulted to assist the patient with chronic disease management and/or care coordination needs related to: Mental Health Counseling and Resources and Grief Counseling.  ? ?Engaged with patient by telephone for follow up visit in response to provider referral for social work chronic care management and care coordination services.  ? ?Consent to Services:  ?The patient was given information about Chronic Care Management services, agreed to services, and gave verbal consent prior to initiation of services.  Please see initial visit note for detailed documentation.  ? ?Patient agreed to services and consent obtained.  ? ?Assessment: Review of patient past medical history, allergies, medications, and health status, including review of relevant consultants reports was performed today as part of a comprehensive evaluation and provision of chronic care management and care coordination services.    ? ?SDOH (Social Determinants of Health) assessments and interventions performed:   ? ?Advanced Directives Status: Not addressed in this encounter. ? ?CCM Care Plan ? ?Allergies  ?Allergen Reactions  ? Amiodarone Swelling  ?  SWELLING REACTION UNSPECIFIED   ? Penicillins Hives and Rash  ?  Has patient had a PCN reaction causing immediate rash, facial/tongue/throat swelling, SOB or lightheadedness with hypotension: No ?Has patient had a PCN reaction causing severe rash involving mucus membranes or skin necrosis: No ?Has patient had a PCN reaction that required hospitalization:Patient was inpatient when reaction occurred ?Has patient had a PCN reaction occurring within the last 10 years: No ?If all of the above answers are "NO", then may proceed with Cephalosporin use  ? Amiodarone Hcl Swelling  ?   SWELLING REACTION UNSPECIFIED   ? Statins Rash  ?  REACTION: rash  ? ? ?Outpatient Encounter Medications as of 06/09/2021  ?Medication Sig  ? acetaminophen (TYLENOL) 500 MG tablet Take 500 mg by mouth every 6 (six) hours as needed for moderate pain.  ? albuterol (VENTOLIN HFA) 108 (90 Base) MCG/ACT inhaler Inhale into the lungs every 6 (six) hours as needed for wheezing or shortness of breath.  ? alendronate (FOSAMAX) 70 MG tablet TAKE 1 TABLET EVERY 7 DAYS. TAKE WITH A FULL GLASS OF WATER ON AN EMPTY STOMACH.  ? budesonide (GNP BUDESONIDE NASAL SPRAY) 32 MCG/ACT nasal spray Place into both nostrils daily.  ? cetirizine (ZYRTEC) 10 MG tablet Take 10 mg by mouth daily as needed for allergies.  ? diphenhydrAMINE-zinc acetate (BENADRYL) cream Apply 1 application topically 3 (three) times daily as needed for itching.  ? ferrous sulfate 325 (65 FE) MG EC tablet TAKE 1 TABLET EVERY DAY WITH BREAKFAST  ? furosemide (LASIX) 40 MG tablet Take 40 mg by mouth daily as needed for edema.  ? hydrOXYzine (ATARAX/VISTARIL) 25 MG tablet Take 1 tablet (25 mg total) by mouth every 6 (six) hours as needed for itching.  ? levothyroxine (SYNTHROID) 75 MCG tablet Take 1 tablet (75 mcg total) by mouth daily before breakfast.  ? Polyethyl Glycol-Propyl Glycol 0.4-0.3 % SOLN Place 1-2 drops into both eyes 3 (three) times daily as needed (for dry eyes.).  ? warfarin (COUMADIN) 5 MG tablet TAKE 1 TABLET BY MOUTH DAILY EXCEPT TAKE 1 AND 1/2 TABLETS ON WEDNESDAYS OR AS DIRECTED BY ANTICOAGULATION CLINIC  ? ?No facility-administered encounter medications on file as of 06/09/2021.  ? ? ?  Patient Active Problem List  ? Diagnosis Date Noted  ? Brain fog 01/14/2021  ? Pressure in head 01/14/2021  ? 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/20/2020  ? Post-menopausal bleeding 11/20/2020  ? Facial paresthesia 10/14/2020  ? Insect bite 09/20/2020  ? Grief reaction 09/20/2020  ? Hearing loss 06/13/2020  ? Atrial fibrillation ([place]) 04/24/2020  ? Pelvic pain 03/11/2020  ?  Routine general medical examination at a health care facility 03/11/2020  ? Elevated glucose 03/03/2020  ? Rectal bleeding 02/18/2020  ? History of TIA (transient ischemic attack) 02/18/2020  ? S/P AV nodal ablation 07/28/19 07/29/2019  ? S/P placement of cardiac pacemaker MDT 07/28/19 07/29/2019  ? AV block 07/28/2019  ? CVA (cerebral vascular accident) ([place]) 06/16/2019  ? Facial tingling 11/29/2018  ? Tremor of left hand 11/29/2018  ? Medicare annual wellness visit, subsequent 11/24/2018  ? Dysuria 02/06/2018  ? Dizzy 02/04/2018  ? Iron deficiency anemia 10/21/2017  ? Rapid atrial fibrillation ([place]) 08/19/2017  ? Constipation 08/02/2017  ? Numbness and tingling 07/14/2017  ? Paresthesia 07/14/2017  ? Unilateral primary osteoarthritis, left hip 05/04/2017  ? Status post total replacement of left hip 05/04/2017  ? Hip osteoarthritis 04/27/2017  ? Long term (current) use of anticoagulants 03/04/2017  ? Venous stasis dermatitis of both lower extremities 01/08/2017  ? Impacted cerumen of right ear 11/20/2016  ? Osteopenia 10/25/2016  ? Pedal edema 08/26/2016  ? Varicose veins of both lower extremities 08/26/2016  ? Estrogen deficiency 08/26/2016  ? Screening mammogram, encounter for 08/26/2016  ? Hemorrhoids 08/26/2016  ? History of nonmelanoma skin cancer 01/01/2016  ? Pruritus 10/04/2015  ? Urticaria 08/22/2014  ? Hip pain 08/02/2014  ? Left knee pain 08/02/2014  ? Chronic cough 05/08/2014  ? Hematochezia 04/09/2014  ? Colon cancer screening 08/16/2013  ? Encounter for therapeutic drug monitoring 04/20/2013  ? Left ovarian cyst 03/14/2013  ? Palpitations 04/15/2012  ? COLONIC POLYPS, ADENOMATOUS, HX OF 09/18/2009  ? PULMONARY NODULE 12/20/2008  ? GANGLION CYST 10/04/2007  ? Mixed incontinence 04/28/2007  ? Hyperlipidemia 04/27/2007  ? Depression with anxiety 04/27/2007  ? Asthma, mild intermittent 04/27/2007  ? INSOMNIA 04/27/2007  ? ADENOMATOUS COLONIC POLYP 11/04/2006  ? Hypothyroidism 09/02/2006  ? Atrial  fibrillation, chronic ([place]) 08/05/2006  ? ? ?Conditions to be addressed/monitored: Depression; Mental Health Concerns  ? ?Care Plan : LCSW Plan of Care  ?Updates made by Deirdre Peer, LCSW since 06/09/2021 12:00 AM  ?  ? ?Problem: Depression/Grief and Loss   ?Priority: High  ?  ? ?Long-Range Goal: Symptoms Monitored and Managed   ?Start Date: 06/06/2021  ?Expected End Date: 07/20/2021  ?Recent Progress: On track  ?Priority: High  ?Note:   ?Current Barriers:  ?Disease Management support and education needs related to Depression: depressed mood ?loss of energy/fatigue ?difficulty concentrating ?impaired memory ?Withdrawn/loneliness, Grief, and overall lack of enjoyment/activity ? ?CSW Clinical Goal(s):  ?Patient  will demonstrate a reduction in symptoms related to :Depression: depressed mood ?loss of energy/fatigue ?difficulty concentrating and Grief  ?patient will work with SW to address concerns related to depression. Isolation/loneliness   through collaboration with [employer] representative, provider, and care team.  ? ?Interventions: ? ?06/09/21- CSW spoke with pt and updated her on appointment scheduled with [facility] for 06/23/21.  "This is a big step for me...." CSW acknowledged and commended her for taking this big step. Pt voices loneliness, loss/grief and overall sense of apathy. She is agreeable to plans and will complete the paperwork for appointment online.  ? ? ?  CSW spoke with pt who acknowledges feelings of depression; apathy, withdrawn, loneliness and overall "emotionally out of touch".  Pt is hesitant to introduce RX for her symptoms at this time and does not want add "happy pill".  She is interested in counseling. CSW completed the Depression Screening with pt; scoring "10". Pt shared with CSW she has had 3 "unexpected deaths"; losing a daughter, her husband and finding her son who had passed. "I did not get to say goodbye". She also reports having moments of the depression hitting her out  of nowhere; "like at a stoplight". She denies SI/HI and has a strong faith as well as family and friends support.  Pt also has felt frustrated and saddened with her inability to do activities she use to enjoy; due

## 2021-06-09 NOTE — Chronic Care Management (AMB) (Signed)
?Chronic Care Management  ? ? Clinical Social Work Note ? ?06/09/2021 ?Name: [name] MRN: [id number] DOB: 07-Nov-1932 ? ?[name] is a 88 y.o. year old female who is a primary care patient of Tower, Wynelle Fanny, MD. The CCM team was consulted to assist the patient with chronic disease management and/or care coordination needs related to: [employer] , Mental Health Counseling and Resources, and Grief Counseling.  ? ?Engaged with patient by telephone for initial visit in response to provider referral for social work chronic care management and care coordination services.  ? ?Consent to Services:  ?The patient was given information about Chronic Care Management services, agreed to services, and gave verbal consent prior to initiation of services.  Please see initial visit note for detailed documentation.  ? ?Patient agreed to services and consent obtained.  ? ?Assessment: Review of patient past medical history, allergies, medications, and health status, including review of relevant consultants reports was performed today as part of a comprehensive evaluation and provision of chronic care management and care coordination services.    ? ?SDOH (Social Determinants of Health) assessments and interventions performed:  ?SDOH Interventions   ? ?Flowsheet Row Most Recent Value  ?SDOH Interventions   ?Transportation Interventions Intervention Not Indicated  ?Depression Interventions/Treatment  Counseling  ? ?  ?  ? ?Advanced Directives Status: Not addressed in this encounter. ? ?CCM Care Plan ? ?Allergies  ?Allergen Reactions  ? Amiodarone Swelling  ?  SWELLING REACTION UNSPECIFIED   ? Penicillins Hives and Rash  ?  Has patient had a PCN reaction causing immediate rash, facial/tongue/throat swelling, SOB or lightheadedness with hypotension: No ?Has patient had a PCN reaction causing severe rash involving mucus membranes or skin necrosis: No ?Has patient had a PCN reaction that required hospitalization:Patient was  inpatient when reaction occurred ?Has patient had a PCN reaction occurring within the last 10 years: No ?If all of the above answers are "NO", then may proceed with Cephalosporin use  ? Amiodarone Hcl Swelling  ?  SWELLING REACTION UNSPECIFIED   ? Statins Rash  ?  REACTION: rash  ? ? ?Outpatient Encounter Medications as of 06/06/2021  ?Medication Sig  ? acetaminophen (TYLENOL) 500 MG tablet Take 500 mg by mouth every 6 (six) hours as needed for moderate pain.  ? albuterol (VENTOLIN HFA) 108 (90 Base) MCG/ACT inhaler Inhale into the lungs every 6 (six) hours as needed for wheezing or shortness of breath.  ? alendronate (FOSAMAX) 70 MG tablet TAKE 1 TABLET EVERY 7 DAYS. TAKE WITH A FULL GLASS OF WATER ON AN EMPTY STOMACH.  ? budesonide (GNP BUDESONIDE NASAL SPRAY) 32 MCG/ACT nasal spray Place into both nostrils daily.  ? cetirizine (ZYRTEC) 10 MG tablet Take 10 mg by mouth daily as needed for allergies.  ? diphenhydrAMINE-zinc acetate (BENADRYL) cream Apply 1 application topically 3 (three) times daily as needed for itching.  ? ferrous sulfate 325 (65 FE) MG EC tablet TAKE 1 TABLET EVERY DAY WITH BREAKFAST  ? furosemide (LASIX) 40 MG tablet Take 40 mg by mouth daily as needed for edema.  ? hydrOXYzine (ATARAX/VISTARIL) 25 MG tablet Take 1 tablet (25 mg total) by mouth every 6 (six) hours as needed for itching.  ? levothyroxine (SYNTHROID) 75 MCG tablet Take 1 tablet (75 mcg total) by mouth daily before breakfast.  ? Polyethyl Glycol-Propyl Glycol 0.4-0.3 % SOLN Place 1-2 drops into both eyes 3 (three) times daily as needed (for dry eyes.).  ? warfarin (COUMADIN) 5 MG tablet  TAKE 1 TABLET BY MOUTH DAILY EXCEPT TAKE 1 AND 1/2 TABLETS ON WEDNESDAYS OR AS DIRECTED BY ANTICOAGULATION CLINIC  ? ?No facility-administered encounter medications on file as of 06/06/2021.  ? ? ?Patient Active Problem List  ? Diagnosis Date Noted  ? Brain fog 01/14/2021  ? Pressure in head 01/14/2021  ? 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/20/2020  ?  Post-menopausal bleeding 11/20/2020  ? Facial paresthesia 10/14/2020  ? Insect bite 09/20/2020  ? Grief reaction 09/20/2020  ? Hearing loss 06/13/2020  ? Atrial fibrillation ([place]) 04/24/2020  ? Pelvic pain 03/11/2020  ? Routine general medical examination at a health care facility 03/11/2020  ? Elevated glucose 03/03/2020  ? Rectal bleeding 02/18/2020  ? History of TIA (transient ischemic attack) 02/18/2020  ? S/P AV nodal ablation 07/28/19 07/29/2019  ? S/P placement of cardiac pacemaker MDT 07/28/19 07/29/2019  ? AV block 07/28/2019  ? CVA (cerebral vascular accident) ([place]) 06/16/2019  ? Facial tingling 11/29/2018  ? Tremor of left hand 11/29/2018  ? Medicare annual wellness visit, subsequent 11/24/2018  ? Dysuria 02/06/2018  ? Dizzy 02/04/2018  ? Iron deficiency anemia 10/21/2017  ? Rapid atrial fibrillation ([place]) 08/19/2017  ? Constipation 08/02/2017  ? Numbness and tingling 07/14/2017  ? Paresthesia 07/14/2017  ? Unilateral primary osteoarthritis, left hip 05/04/2017  ? Status post total replacement of left hip 05/04/2017  ? Hip osteoarthritis 04/27/2017  ? Long term (current) use of anticoagulants 03/04/2017  ? Venous stasis dermatitis of both lower extremities 01/08/2017  ? Impacted cerumen of right ear 11/20/2016  ? Osteopenia 10/25/2016  ? Pedal edema 08/26/2016  ? Varicose veins of both lower extremities 08/26/2016  ? Estrogen deficiency 08/26/2016  ? Screening mammogram, encounter for 08/26/2016  ? Hemorrhoids 08/26/2016  ? History of nonmelanoma skin cancer 01/01/2016  ? Pruritus 10/04/2015  ? Urticaria 08/22/2014  ? Hip pain 08/02/2014  ? Left knee pain 08/02/2014  ? Chronic cough 05/08/2014  ? Hematochezia 04/09/2014  ? Colon cancer screening 08/16/2013  ? Encounter for therapeutic drug monitoring 04/20/2013  ? Left ovarian cyst 03/14/2013  ? Palpitations 04/15/2012  ? COLONIC POLYPS, ADENOMATOUS, HX OF 09/18/2009  ? PULMONARY NODULE 12/20/2008  ? GANGLION CYST 10/04/2007  ? Mixed incontinence 04/28/2007   ? Hyperlipidemia 04/27/2007  ? Depression with anxiety 04/27/2007  ? Asthma, mild intermittent 04/27/2007  ? INSOMNIA 04/27/2007  ? ADENOMATOUS COLONIC POLYP 11/04/2006  ? Hypothyroidism 09/02/2006  ? Atrial fibrillation, chronic ([place]) 08/05/2006  ? ? ?Conditions to be addressed/monitored: Depression; Mental Health Concerns  ? ?Care Plan : LCSW Plan of Care  ?Updates made by Deirdre Peer, LCSW since 06/09/2021 12:00 AM  ?  ? ?Problem: Depression/Grief and Loss   ?Priority: High  ?  ? ?Long-Range Goal: Symptoms Monitored and Managed   ?Start Date: 06/06/2021  ?Expected End Date: 07/20/2021  ?This Visit's Progress: On track  ?Priority: High  ?Note:   ?Current Barriers:  ?Disease Management support and education needs related to Depression: depressed mood ?loss of energy/fatigue ?difficulty concentrating ?impaired memory ?Withdrawn/loneliness, Grief, and overall lack of enjoyment/activity ? ?CSW Clinical Goal(s):  ?Patient  will demonstrate a reduction in symptoms related to :Depression: depressed mood ?loss of energy/fatigue ?difficulty concentrating and Grief  ?patient will work with SW to address concerns related to depression. Isolation/loneliness   through collaboration with [employer] representative, provider, and care team.  ? ?Interventions: ? ?CSW spoke with pt who acknowledges feelings of depression; apathy, withdrawn, loneliness and overall "emotionally out of touch".  Pt is hesitant  to introduce RX for her symptoms at this time and does not want add "happy pill".  She is interested in counseling. CSW completed the Depression Screening with pt; scoring "10". Pt shared with CSW she has had 3 "unexpected deaths"; losing a daughter, her husband and finding her son who had passed. "I did not get to say goodbye". She also reports having moments of the depression hitting her out of nowhere; "like at a stoplight". She denies SI/HI and has a strong faith as well as family and friends support.  Pt also has felt  frustrated and saddened with her inability to do activities she use to enjoy; due to medical and vision issues. She is hopeful a visit to Neurologist at [facility] in June may offer some answers and options surrounding t

## 2021-06-09 NOTE — Patient Instructions (Signed)
Visit Information  ? ?Thank you for taking time to visit with me today. Please don't hesitate to contact me if I can be of assistance to you before our next scheduled telephone appointment. ? ?Following are the goals we discussed today:  ?(Copy and paste patient goals from clinical care plan here) ? ?Our next appointment is by telephone on 06/09/21 at 10 ? ?Please call the care guide team at [phone] if you need to cancel or reschedule your appointment.  ? ?If you are experiencing a Mental Health or [place] or need someone to talk to, please call the Suicide and Crisis Lifeline: 988 ?call the [place] National Suicide Prevention Lifeline: 352-560-8888 or TTY: 828-780-6058 TTY 979-736-5204) to talk to a trained counselor ?call 1-800-273-TALK (toll free, 24 hour hotline) ?go to [facility] [address] [place] [phone]) ?call 911  ? ?Following is a copy of your full care plan:  ?Care Plan : [place]  ?Updates made by Deirdre Peer, LCSW since 06/09/2021 12:00 AM  ?  ? ?Problem: Depression/Grief and Loss   ?Priority: High  ?  ? ?Long-Range Goal: Symptoms Monitored and Managed   ?Start Date: 06/06/2021  ?Expected End Date: 07/20/2021  ?This Visit's Progress: On track  ?Priority: High  ?Note:   ?Current Barriers:  ?Disease Management support and education needs related to Depression: depressed mood ?loss of energy/fatigue ?difficulty concentrating ?impaired memory ?Withdrawn/loneliness, Grief, and overall lack of enjoyment/activity ? ?CSW Clinical Goal(s):  ?Patient  will demonstrate a reduction in symptoms related to :Depression: depressed mood ?loss of energy/fatigue ?difficulty concentrating and Grief  ?patient will work with SW to address concerns related to depression. Isolation/loneliness   through collaboration with [employer] representative, provider, and care team.  ? ?Interventions: ? ?CSW spoke with pt who acknowledges feelings of  depression; apathy, withdrawn, loneliness and overall "emotionally out of touch".  Pt is hesitant to introduce RX for her symptoms at this time and does not want add "happy pill".  She is interested in counseling. CSW completed the Depression Screening with pt; scoring "10". Pt shared with CSW she has had 3 "unexpected deaths"; losing a daughter, her husband and finding her son who had passed. "I did not get to say goodbye". She also reports having moments of the depression hitting her out of nowhere; "like at a stoplight". She denies SI/HI and has a strong faith as well as family and friends support.  Pt also has felt frustrated and saddened with her inability to do activities she use to enjoy; due to medical and vision issues. She is hopeful a visit to Neurologist at [facility] in June may offer some answers and options surrounding the after effects she has from being shocked by her oven range.   ?CSW listened and validated her feelings and offered support and encouragement. CSW talked with pt about the cyclic effect of her medical issues impacting her depression and her depression impacting her medical issues.  ? ?Depression screen [facility] 2/9 06/06/2021 03/10/2021 03/08/2020 09/05/2018  ?Decreased Interest 2 0 0 0  ?Down, Depressed, Hopeless 1 0 0 0  ?PHQ - 2 Score 3 0 0 0  ?Altered sleeping 1 - 0 0  ?Tired, decreased energy 1 - 0 0  ?Change in appetite 0 - 0 0  ?Feeling bad or failure about yourself  2 - 0 0  ?Trouble concentrating 3 - 0 0  ?Moving slowly or fidgety/restless 0 - 0 0  ?Suicidal thoughts  0 - 0 0  ?PHQ-9 Score 10 - 0 0  ?Difficult doing work/chores Somewhat difficult - Not difficult at all Not difficult at all  ?Some recent data might be hidden  ?  ?1:1 collaboration with primary care provider regarding development and update of comprehensive plan of care as evidenced by provider attestation and co-signature ?Inter-disciplinary care team collaboration (see longitudinal plan of care) ?Evaluation of current  treatment plan related to  self management and patient's adherence to plan as established by provider ?Review resources, discussed options and provided patient information about  ?Hospice bereavement program(s) ? ? ?Mental Health:  (Status: New goal.) ?Evaluation of current treatment plan related to Depression: depressed mood and Grief ?Depression screen reviewed  ?PHQ2/ PHQ9 completed ?Solution-Focused Strategies employed:  ?Active listening / Reflection utilized  ?Emotional Support Provided ?Provided psychoeducation for mental health needs  ? ?Task & activities to accomplish goals: ?Continue with compliance of taking medication  ?Continue with your self-care action plan  ?I have placed a referral Jasmine Awe with [facility] will contact you.   ? ? ? ?  ?  ? ? ?Consent to CCM Services: ?Ms. Arata was given information about Chronic Care Management services including:  ?CCM service includes personalized support from designated clinical staff supervised by her physician, including individualized plan of care and coordination with other care providers ?24/7 contact phone numbers for assistance for urgent and routine care needs. ?Service will only be billed when office clinical staff spend 20 minutes or more in a month to coordinate care. ?Only one practitioner may furnish and bill the service in a calendar month. ?The patient may stop CCM services at any time (effective at the end of the month) by phone call to the office staff. ?The patient will be responsible for cost sharing (co-pay) of up to 20% of the service fee (after annual deductible is met). ? ?Patient agreed to services and verbal consent obtained.  ? ?Patient verbalizes understanding of instructions and care plan provided today and agrees to view in [place]. Active MyChart status confirmed with patient.   ? ?Telephone follow up appointment with care management team member scheduled for:06/09/21 ? ? ?  ?

## 2021-06-09 NOTE — Patient Instructions (Signed)
Visit Information ? ?Thank you for taking time to visit with me today. Please don't hesitate to contact me if I can be of assistance to you before our next scheduled telephone appointment. ? ?Following are the goals we discussed today:  ?-complete paperwork online for [facility] ?-attend counseling visit set for 06/23/21 at 11am with Jasmine Awe ?- keep 90 percent of counseling appointments ?- schedule counseling appointment  ?- seek opportunities to engage and reduce loneliness and increase activity/socialization ?-consider and discuss medication options with PCP and Counselor ?-expect info being mailed to you on the (free bereavement program at local hospice) and review/consider participating ?  ? ?Our next appointment is by telephone on 06/23/21   ? ?Please call the care guide team at [phone] if you need to cancel or reschedule your appointment.  ? ?If you are experiencing a Mental Health or [place] or need someone to talk to, please call the Suicide and Crisis Lifeline: 988 ?go to [facility] [address], [place] [phone]) ?call 911  ? ?Patient verbalizes understanding of instructions and care plan provided today and agrees to view in [place]. Active MyChart status confirmed with patient.   ?Eduard Clos MSW, LCSW ?Licensed Clinical Social Worker ?[place]   ?[phone]  ?

## 2021-06-10 DIAGNOSIS — Z85828 Personal history of other malignant neoplasm of skin: Secondary | ICD-10-CM | POA: Diagnosis not present

## 2021-06-10 DIAGNOSIS — L820 Inflamed seborrheic keratosis: Secondary | ICD-10-CM | POA: Diagnosis not present

## 2021-06-20 DIAGNOSIS — J4520 Mild intermittent asthma, uncomplicated: Secondary | ICD-10-CM

## 2021-06-20 DIAGNOSIS — F4321 Adjustment disorder with depressed mood: Secondary | ICD-10-CM | POA: Diagnosis not present

## 2021-06-20 DIAGNOSIS — E039 Hypothyroidism, unspecified: Secondary | ICD-10-CM

## 2021-06-20 DIAGNOSIS — E7849 Other hyperlipidemia: Secondary | ICD-10-CM | POA: Diagnosis not present

## 2021-06-20 DIAGNOSIS — I4821 Permanent atrial fibrillation: Secondary | ICD-10-CM | POA: Diagnosis not present

## 2021-06-23 ENCOUNTER — Ambulatory Visit (INDEPENDENT_AMBULATORY_CARE_PROVIDER_SITE_OTHER): Payer: Medicare Other | Admitting: *Deleted

## 2021-06-23 DIAGNOSIS — I4891 Unspecified atrial fibrillation: Secondary | ICD-10-CM

## 2021-06-23 DIAGNOSIS — I4821 Permanent atrial fibrillation: Secondary | ICD-10-CM

## 2021-06-23 DIAGNOSIS — R4189 Other symptoms and signs involving cognitive functions and awareness: Secondary | ICD-10-CM

## 2021-06-23 DIAGNOSIS — F4321 Adjustment disorder with depressed mood: Secondary | ICD-10-CM

## 2021-06-23 DIAGNOSIS — F4320 Adjustment disorder, unspecified: Secondary | ICD-10-CM | POA: Diagnosis not present

## 2021-06-23 DIAGNOSIS — F418 Other specified anxiety disorders: Secondary | ICD-10-CM

## 2021-06-23 NOTE — Telephone Encounter (Signed)
Apologized to patient for it taking so long to get in touch with her. Patient stated that she would like to schedule an appointment to come in and see Dr. Glori Bickers. Patient scheduled for an appointment with Dr. Glori Bickers 06/25/21 at 10:30 am. ?

## 2021-06-24 NOTE — Chronic Care Management (AMB) (Signed)
?Chronic Care Management  ? ? Clinical Social Work Note ? ?06/24/2021 ?Name: [name] MRN: [id number] DOB: Mar 31, 1932 ? ?[name] is a 88 y.o. year old female who is a primary care patient of Tower, Wynelle Fanny, MD. The CCM team was consulted to assist the patient with chronic disease management and/or care coordination needs related to: Mental Health Counseling and Resources and Grief Counseling.  ? ?Engaged with patient by telephone for follow up visit in response to provider referral for social work chronic care management and care coordination services.  ? ?Consent to Services:  ?The patient was given information about Chronic Care Management services, agreed to services, and gave verbal consent prior to initiation of services.  Please see initial visit note for detailed documentation.  ? ?Patient agreed to services and consent obtained.  ? ?Assessment: Review of patient past medical history, allergies, medications, and health status, including review of relevant consultants reports was performed today as part of a comprehensive evaluation and provision of chronic care management and care coordination services.    ? ?SDOH (Social Determinants of Health) assessments and interventions performed:   ? ?Advanced Directives Status: Not addressed in this encounter. ? ?CCM Care Plan ? ?Allergies  ?Allergen Reactions  ? Amiodarone Swelling  ?  SWELLING REACTION UNSPECIFIED   ? Penicillins Hives and Rash  ?  Has patient had a PCN reaction causing immediate rash, facial/tongue/throat swelling, SOB or lightheadedness with hypotension: No ?Has patient had a PCN reaction causing severe rash involving mucus membranes or skin necrosis: No ?Has patient had a PCN reaction that required hospitalization:Patient was inpatient when reaction occurred ?Has patient had a PCN reaction occurring within the last 10 years: No ?If all of the above answers are "NO", then may proceed with Cephalosporin use  ? Amiodarone Hcl Swelling  ?   SWELLING REACTION UNSPECIFIED   ? Statins Rash  ?  REACTION: rash  ? ? ?Outpatient Encounter Medications as of 06/23/2021  ?Medication Sig  ? acetaminophen (TYLENOL) 500 MG tablet Take 500 mg by mouth every 6 (six) hours as needed for moderate pain.  ? albuterol (VENTOLIN HFA) 108 (90 Base) MCG/ACT inhaler Inhale into the lungs every 6 (six) hours as needed for wheezing or shortness of breath.  ? alendronate (FOSAMAX) 70 MG tablet TAKE 1 TABLET EVERY 7 DAYS. TAKE WITH A FULL GLASS OF WATER ON AN EMPTY STOMACH.  ? budesonide (GNP BUDESONIDE NASAL SPRAY) 32 MCG/ACT nasal spray Place into both nostrils daily.  ? cetirizine (ZYRTEC) 10 MG tablet Take 10 mg by mouth daily as needed for allergies.  ? diphenhydrAMINE-zinc acetate (BENADRYL) cream Apply 1 application topically 3 (three) times daily as needed for itching.  ? ferrous sulfate 325 (65 FE) MG EC tablet TAKE 1 TABLET EVERY DAY WITH BREAKFAST  ? furosemide (LASIX) 40 MG tablet Take 40 mg by mouth daily as needed for edema.  ? hydrOXYzine (ATARAX/VISTARIL) 25 MG tablet Take 1 tablet (25 mg total) by mouth every 6 (six) hours as needed for itching.  ? levothyroxine (SYNTHROID) 75 MCG tablet Take 1 tablet (75 mcg total) by mouth daily before breakfast.  ? Polyethyl Glycol-Propyl Glycol 0.4-0.3 % SOLN Place 1-2 drops into both eyes 3 (three) times daily as needed (for dry eyes.).  ? warfarin (COUMADIN) 5 MG tablet TAKE 1 TABLET BY MOUTH DAILY EXCEPT TAKE 1 AND 1/2 TABLETS ON WEDNESDAYS OR AS DIRECTED BY ANTICOAGULATION CLINIC  ? ?No facility-administered encounter medications on file as of 06/23/2021.  ? ? ?  Patient Active Problem List  ? Diagnosis Date Noted  ? Brain fog 01/14/2021  ? Pressure in head 01/14/2021  ? 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/20/2020  ? Post-menopausal bleeding 11/20/2020  ? Facial paresthesia 10/14/2020  ? Insect bite 09/20/2020  ? Grief reaction 09/20/2020  ? Hearing loss 06/13/2020  ? Atrial fibrillation ([place]) 04/24/2020  ? Pelvic pain 03/11/2020  ?  Routine general medical examination at a health care facility 03/11/2020  ? Elevated glucose 03/03/2020  ? Rectal bleeding 02/18/2020  ? History of TIA (transient ischemic attack) 02/18/2020  ? S/P AV nodal ablation 07/28/19 07/29/2019  ? S/P placement of cardiac pacemaker MDT 07/28/19 07/29/2019  ? AV block 07/28/2019  ? CVA (cerebral vascular accident) ([place]) 06/16/2019  ? Facial tingling 11/29/2018  ? Tremor of left hand 11/29/2018  ? Medicare annual wellness visit, subsequent 11/24/2018  ? Dysuria 02/06/2018  ? Dizzy 02/04/2018  ? Iron deficiency anemia 10/21/2017  ? Rapid atrial fibrillation ([place]) 08/19/2017  ? Constipation 08/02/2017  ? Numbness and tingling 07/14/2017  ? Paresthesia 07/14/2017  ? Unilateral primary osteoarthritis, left hip 05/04/2017  ? Status post total replacement of left hip 05/04/2017  ? Hip osteoarthritis 04/27/2017  ? Long term (current) use of anticoagulants 03/04/2017  ? Venous stasis dermatitis of both lower extremities 01/08/2017  ? Impacted cerumen of right ear 11/20/2016  ? Osteopenia 10/25/2016  ? Pedal edema 08/26/2016  ? Varicose veins of both lower extremities 08/26/2016  ? Estrogen deficiency 08/26/2016  ? Screening mammogram, encounter for 08/26/2016  ? Hemorrhoids 08/26/2016  ? History of nonmelanoma skin cancer 01/01/2016  ? Pruritus 10/04/2015  ? Urticaria 08/22/2014  ? Hip pain 08/02/2014  ? Left knee pain 08/02/2014  ? Chronic cough 05/08/2014  ? Hematochezia 04/09/2014  ? Colon cancer screening 08/16/2013  ? Encounter for therapeutic drug monitoring 04/20/2013  ? Left ovarian cyst 03/14/2013  ? Palpitations 04/15/2012  ? COLONIC POLYPS, ADENOMATOUS, HX OF 09/18/2009  ? PULMONARY NODULE 12/20/2008  ? GANGLION CYST 10/04/2007  ? Mixed incontinence 04/28/2007  ? Hyperlipidemia 04/27/2007  ? Depression with anxiety 04/27/2007  ? Asthma, mild intermittent 04/27/2007  ? INSOMNIA 04/27/2007  ? ADENOMATOUS COLONIC POLYP 11/04/2006  ? Hypothyroidism 09/02/2006  ? Atrial  fibrillation, chronic ([place]) 08/05/2006  ? ? ?Conditions to be addressed/monitored: Depression; Mental Health Concerns  ? ?Care Plan : LCSW Plan of Care  ?Updates made by Deirdre Peer, LCSW since 06/24/2021 12:00 AM  ?  ? ?Problem: Depression/Grief and Loss   ?Priority: High  ?  ? ?Long-Range Goal: Symptoms Monitored and Managed   ?Start Date: 06/06/2021  ?Expected End Date: 08/19/2021  ?This Visit's Progress: On track  ?Recent Progress: On track  ?Priority: High  ?Note:   ?Current Barriers:  ?Disease Management support and education needs related to Depression: depressed mood ?loss of energy/fatigue ?difficulty concentrating ?impaired memory ?Withdrawn/loneliness, Grief, and overall lack of enjoyment/activity ? ?CSW Clinical Goal(s):  ?Patient  will demonstrate a reduction in symptoms related to :Depression: depressed mood ?loss of energy/fatigue ?difficulty concentrating and Grief  ?patient will work with SW to address concerns related to depression. Isolation/loneliness   through collaboration with [facility] representative, provider, and care team.  ? ?Interventions: ?06/23/21- pt reports going to her initial visit with counselor today. "It went well". She does not have a scheduled follow up and is aware she can decide and reach out to them as desired.  ?Pt is still anxiously awaiting her Neuro eval at [facility]- validated her feelings and offered support.  Pt shared she is not interested in any "groups" or support groups due to feeling it is hard to "not take on other peoples issues and vice versa.  ?Pt states she has been proactive with getting out and being active; gardening, etc as well as doing daily brain exercises.  ?Offered support and to follow up next month ?06/09/21- CSW spoke with pt and updated her on appointment scheduled with [facility] for 06/23/21.  "This is a big step for me...." CSW acknowledged and commended her for taking this big step. Pt voices loneliness, loss/grief and overall sense of  apathy. She is agreeable to plans and will complete the paperwork for appointment online.  ? ? ?CSW spoke with pt who acknowledges feelings of depression; apathy, withdrawn, loneliness and overall "emotionally out of to

## 2021-06-24 NOTE — Patient Instructions (Signed)
Visit Information ? ?Thank you for taking time to visit with me today. Please don't hesitate to contact me if I can be of assistance to you before our next scheduled telephone appointment. ? ? Our next appointment is by telephone on 07/28/21   ? ?Please call the care guide team at [phone] if you need to cancel or reschedule your appointment.  ? ?If you are experiencing a Mental Health or [place] or need someone to talk to, please call the [place] National Suicide Prevention Lifeline: 223-020-1363 or TTY: 8470719640 TTY [phone]) to talk to a trained counselor ?go to [facility] [address], [place] [phone]) ?call 911  ? ?The patient verbalized understanding of instructions, educational materials, and care plan provided today and declined offer to receive copy of patient instructions, educational materials, and care plan.  ? ?Eduard Clos MSW, LCSW ?Licensed Clinical Social Worker ?[place]   ?[phone]  ?

## 2021-06-24 NOTE — Progress Notes (Signed)
I have personally reviewed this encounter including the documentation in this note and have collaborated with the care management provider regarding care management and care coordination activities to include development and update of the comprehensive care plan. I am certifying that I agree with the content of this note and encounter as supervising physician. ? ?[name] MD  ?

## 2021-06-25 ENCOUNTER — Encounter: Payer: Self-pay | Admitting: Family Medicine

## 2021-06-25 ENCOUNTER — Ambulatory Visit (INDEPENDENT_AMBULATORY_CARE_PROVIDER_SITE_OTHER): Payer: Medicare Other | Admitting: Family Medicine

## 2021-06-25 VITALS — BP 130/78 | HR 87 | Temp 97.1°F | Ht 64.0 in | Wt 164.0 lb

## 2021-06-25 DIAGNOSIS — E039 Hypothyroidism, unspecified: Secondary | ICD-10-CM | POA: Diagnosis not present

## 2021-06-25 DIAGNOSIS — R638 Other symptoms and signs concerning food and fluid intake: Secondary | ICD-10-CM | POA: Diagnosis not present

## 2021-06-25 DIAGNOSIS — F4321 Adjustment disorder with depressed mood: Secondary | ICD-10-CM | POA: Diagnosis not present

## 2021-06-25 DIAGNOSIS — R7309 Other abnormal glucose: Secondary | ICD-10-CM

## 2021-06-25 LAB — BASIC METABOLIC PANEL
BUN: 26 mg/dL — ABNORMAL HIGH (ref 6–23)
CO2: 27 mEq/L (ref 19–32)
Calcium: 9.1 mg/dL (ref 8.4–10.5)
Chloride: 106 mEq/L (ref 96–112)
Creatinine, Ser: 0.71 mg/dL (ref 0.40–1.20)
GFR: 76.86 mL/min (ref >60.00)
Glucose, Bld: 81 mg/dL (ref 70–99)
Potassium: 4.1 mEq/L (ref 3.5–5.1)
Sodium: 141 mEq/L (ref 135–145)

## 2021-06-25 LAB — HEMOGLOBIN A1C: Hgb A1c MFr Bld: 5.8 % (ref 4.6–6.5)

## 2021-06-25 LAB — TSH: TSH: 3.72 u[IU]/mL (ref 0.35–5.50)

## 2021-06-25 NOTE — Assessment & Plan Note (Signed)
Doing better ?Encouraged her to f/u with therapist  ?Reviewed stressors/ coping techniques/symptoms/ support sources/ tx options and side effects in detail today  ?

## 2021-06-25 NOTE — Assessment & Plan Note (Addendum)
Without GI symptoms or weight change or bp changes  ?Drinks excessive water (? 90 oz daily) out of habit ?Enc her to cut to 64 oz or less ?Labs for bmet, tsh and a1c  ?Reassuring exam ? ?Will plan am draw for cortisol and ACTH to r/u adrenal insufficiency  ?

## 2021-06-25 NOTE — Assessment & Plan Note (Addendum)
TSH drawn today  ? ?Takes 75 mcg daily  ?No missed doses  ?

## 2021-06-25 NOTE — Progress Notes (Signed)
? ?Subjective:  ? ? Patient ID: [name], female    DOB: 05/23/1932, 88 y.o.   MRN: [id number] ? ?HPI ?Pt c/o salt craving  ? ?Wt Readings from Last 3 Encounters:  ?06/25/21 164 lb (74.4 kg)  ?04/23/21 160 lb (72.6 kg)  ?04/09/21 162 lb (73.5 kg)  ? ?28.15 kg/m? ? ?Feels like she is over salting things  ?Craves it and wants it  ?Has always liked salt  ? ?Has gained a little weight back  ?Easing back into a normal eating pattern/ does not want to gain ?Appetite is fair/ok    ?Really limiting her carbs/very mindful of what she eats ?Drinks a lot of water  ?Some coffee in am and occ tea  ? ?No n/v  ?No GI problems  ?No exhaustion  ?No low bp or blood sugar  ? ? ?Allergies are bothering her  ?R ear - dull thud /pressure every now and then  ? ?Working on mental health (depression)  ?She did go to a therapist (one visit so far) ?A bad year and not herself emotionally  ?Grief  (3 family members) , tired of grief and the routine of pushing herself  ?Anxious about not living long enough to see family grow up  ?Lingering effects  ?Trying to get out more with sunshine ?Whole family has been out of whack  ? 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 ? ?Lab Results  ?Component Value Date  ? CREATININE 0.78 03/02/2021  ? BUN 24 (H) 03/02/2021  ? NA 136 03/02/2021  ? K 3.7 03/02/2021  ? CL 101 03/02/2021  ? CO2 27 03/02/2021  ? ? ?Lab Results  ?Component Value Date  ? TSH 3.06 12/20/2020  ?Does take biotin for nails  ?L 4th nail had white area distally ?Takes collagen  ? ? ?Lab Results  ?Component Value Date  ? WBC 5.0 03/28/2021  ? HGB 12.3 03/28/2021  ? HCT 37.5 03/28/2021  ? MCV 88.2 03/28/2021  ? PLT 151 03/28/2021  ? ?Takes lasix  ? ?Last random glucose level was 170  ? ?Lab Results  ?Component Value Date  ? IRON 44 12/20/2020  ? TIBC 350 12/28/2018  ? FERRITIN 50.6 12/20/2020  ?  ? ?BP Readings from Last 3 Encounters:  ?06/25/21  130/78  ?04/23/21 124/77  ?04/09/21 (!) 144/72  ? ?Pulse Readings from Last 3 Encounters:  ?06/25/21 87  ?04/23/21 68  ?04/09/21 69  ? ?Patient Active Problem List  ? Diagnosis Date Noted  ? Salt craving 06/25/2021  ? Brain fog 01/14/2021  ? Pressure in head 01/14/2021  ? 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/20/2020  ? Post-menopausal bleeding 11/20/2020  ? Facial paresthesia 10/14/2020  ? Insect bite 09/20/2020  ? Grief reaction 09/20/2020  ? Hearing loss 06/13/2020  ? Atrial fibrillation ([place]) 04/24/2020  ? Pelvic pain 03/11/2020  ? Routine general medical examination at a health care facility 03/11/2020  ? Elevated glucose 03/03/2020  ? Rectal bleeding 02/18/2020  ? History of TIA (transient ischemic attack) 02/18/2020  ? S/P AV nodal ablation 07/28/19 07/29/2019  ? S/P placement of cardiac pacemaker MDT 07/28/19 07/29/2019  ? AV block 07/28/2019  ? CVA (cerebral vascular accident) ([place]) 06/16/2019  ? Facial tingling 11/29/2018  ? Tremor of left hand 11/29/2018  ? Medicare annual wellness visit, subsequent 11/24/2018  ? Dysuria 02/06/2018  ? Dizzy 02/04/2018  ?  Iron deficiency anemia 10/21/2017  ? Rapid atrial fibrillation ([place]) 08/19/2017  ? Constipation 08/02/2017  ? Numbness and tingling 07/14/2017  ? Paresthesia 07/14/2017  ? Unilateral primary osteoarthritis, left hip 05/04/2017  ? Status post total replacement of left hip 05/04/2017  ? Hip osteoarthritis 04/27/2017  ? Long term (current) use of anticoagulants 03/04/2017  ? Venous stasis dermatitis of both lower extremities 01/08/2017  ? Impacted cerumen of right ear 11/20/2016  ? Osteopenia 10/25/2016  ? Pedal edema 08/26/2016  ? Varicose veins of both lower extremities 08/26/2016  ? Estrogen deficiency 08/26/2016  ? Screening mammogram, encounter for 08/26/2016  ? Hemorrhoids 08/26/2016  ? History of nonmelanoma skin cancer 01/01/2016  ? Pruritus 10/04/2015  ? Urticaria 08/22/2014  ? Hip pain 08/02/2014  ? Left knee pain 08/02/2014  ? Chronic cough 05/08/2014  ?  Hematochezia 04/09/2014  ? Colon cancer screening 08/16/2013  ? Encounter for therapeutic drug monitoring 04/20/2013  ? Left ovarian cyst 03/14/2013  ? Palpitations 04/15/2012  ? COLONIC POLYPS, ADENOMATOUS, HX OF 09/18/2009  ? PULMONARY NODULE 12/20/2008  ? GANGLION CYST 10/04/2007  ? Mixed incontinence 04/28/2007  ? Hyperlipidemia 04/27/2007  ? Depression with anxiety 04/27/2007  ? Asthma, mild intermittent 04/27/2007  ? INSOMNIA 04/27/2007  ? ADENOMATOUS COLONIC POLYP 11/04/2006  ? Hypothyroidism 09/02/2006  ? Atrial fibrillation, chronic ([place]) 08/05/2006  ? ?Past Medical History:  ?Diagnosis Date  ? Allergic rhinitis   ? 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 Anemia   ? Arthritis   ? Atrial fibrillation -persistent cardiologist-  dr klein/  primary EP -- dr Tawanna Sat ([facility])  ? a. s/p PVI [name] 2010;  b. on tikosyn/coumadin;  c. 05/2009 Echo: EF 60-65%, Gr 2 DD. (first dx 09/ 2007)  ? Bilateral lower extremity edema   ? Bleeding hemorrhoid   ? Carotid stenosis   ? mild (hosp 3/11)- consult by vasc/ Dr Early  ? Complication of anesthesia   ? hard to wake  ? Diverticulosis of colon   ? Dyspnea   ? on exertion-climbing stairs  ? Fatty liver   ? H/O cardiac radiofrequency ablation   ? 01/ 2008 at [facility] [place] /  03/ 2010  at [facility]  ? Heart failure with preserved ejection fraction ([place])   ? History of adenomatous polyp of colon   ? tubular adenoma's  ? History of cardiomyopathy   ? secondary tachycardia-induced cardiomyopathy -- resolved 2014  ? History of squamous cell carcinoma in situ (SCCIS) of skin   ? 05/ 2017  nasal bridge and right medial knee  ? History of transient ischemic attack (TIA)   ? 01-24-2005 and 06-12-2009  ? Hyperlipidemia   ? Hypothyroidism   ? Mild intermittent asthma   ? reacts to cats  ? Mixed stress and urge urinary incontinence   ? Presence of permanent cardiac pacemaker   ? was put in 07/2019  ? Pulmonary nodule   ? S/P AV nodal ablation 07/28/19 07/29/2019  ? S/P mitral valve repair  10-23-1998  dr Boyce Medici at [facility]  ? for MVP and regurg. (annuloplasty ring procedure)  ? S/P placement of cardiac pacemaker MDT 07/28/19 07/29/2019  ? ?Past Surgical History:  ?Procedure Laterality Date  ? APPENDECTOMY  1978  ? AV NODE ABLATION N/A 07/28/2019  ? Procedure: AV NODE ABLATION;  Surgeon: Deboraha Sprang, MD;  Location: [facility];  Service: Cardiovascular;  Laterality: N/A;  ? BUBBLE STUDY  06/19/2019  ? Procedure: BUBBLE STUDY;  Surgeon:  Pixie Casino, MD;  Location: [facility] ENDOSCOPY;  Service: Cardiovascular;;  ? CARDIAC ELECTROPHYSIOLOGY [place]  01/ 2008    at [facility] [place]  ? right-sided ablation atrial flutter  ? CARDIAC ELECTROPHYSIOLOGY STUDY AND ABLATION  03/ 2010   dr Jaymes Graff at [facility]  ? AV node ablation and pulmonary vein isolation for atrial fib  ? CARDIOVERSION  06-18-2006;  07-13-2006;  10-19-2010;  10-27-2010  ? CATARACT EXTRACTION W/PHACO Right 04/09/2021  ? Procedure: CATARACT EXTRACTION PHACO AND INTRAOCULAR LENS PLACEMENT (IOC) RIGHT 6.71 01:11.1;  Surgeon: Leandrew Koyanagi, MD;  Location: [facility];  Service: Ophthalmology;  Laterality: Right;  ? CATARACT EXTRACTION W/PHACO Left 04/23/2021  ? Procedure: CATARACT EXTRACTION PHACO AND INTRAOCULAR LENS PLACEMENT ([place]) LEFT;  Surgeon: Leandrew Koyanagi, MD;  Location: [place];  Service: Ophthalmology;  Laterality: Left;  Lazarus Salines ?5.65 ?00:50.1  ? COLONOSCOPY    ? COLONOSCOPY WITH PROPOFOL N/A 10/13/2017  ? Procedure: COLONOSCOPY WITH PROPOFOL;  Surgeon: [name], MD;  Location: [facility] ENDOSCOPY;  Service: Gastroenterology;  Laterality: N/A;  ? COLONOSCOPY WITH PROPOFOL N/A 02/20/2020  ? Procedure: COLONOSCOPY WITH PROPOFOL;  Surgeon: Lesly Rubenstein, MD;  Location: [facility] ENDOSCOPY;  Service: Endoscopy;  Laterality: N/A;  ? CYSTO/ TRANSURETHRAL COLLAGEN INJECTION THERAPY  07-26-2007   dr Matilde Sprang  ? DILATION AND CURETTAGE OF UTERUS    ? ESOPHAGOGASTRODUODENOSCOPY (EGD) WITH  PROPOFOL N/A 10/13/2017  ? Procedure: ESOPHAGOGASTRODUODENOSCOPY (EGD) WITH PROPOFOL;  Surgeon: [name], MD;  Location: [facility] ENDOSCOPY;  Service: Gastroenterology;  Laterality: N/A;  ? EVALUATION UNDER ANESTHESIA [place]

## 2021-06-25 NOTE — Addendum Note (Signed)
Addended by: [name] A on: 06/25/2021 05:43 PM ? ? Modules accepted: Orders ? ?

## 2021-06-25 NOTE — Patient Instructions (Addendum)
Lab today for TSH and a1c and bmet  ? ? ?Stop at check out to schedule a fasting draw for cortisol /adrenal numbers ? ?Don't drink more than 64 oz of water daily  ? ?Take care of yourself  ? ? ? ? ?

## 2021-06-25 NOTE — Assessment & Plan Note (Signed)
Some salt craving ?No excessive thirst or urination  ?Does drink 90 oz of water daily ?a1c ordered ?No symptoms of hypoglycemia  ?

## 2021-07-03 ENCOUNTER — Ambulatory Visit (INDEPENDENT_AMBULATORY_CARE_PROVIDER_SITE_OTHER): Payer: Medicare Other

## 2021-07-03 DIAGNOSIS — Z7901 Long term (current) use of anticoagulants: Secondary | ICD-10-CM | POA: Diagnosis not present

## 2021-07-03 LAB — POCT INR: INR: 2.7 (ref 2.0–3.0)

## 2021-07-03 NOTE — Patient Instructions (Addendum)
Pre visit review using our clinic review tool, if applicable. No additional management support is needed unless otherwise documented below in the visit note.  Continue 1 tablet daily . Recheck in 4 weeks.  

## 2021-07-03 NOTE — Progress Notes (Signed)
Continue 1 tablet daily. Recheck in 4 weeks.   

## 2021-07-04 DIAGNOSIS — Z20822 Contact with and (suspected) exposure to covid-19: Secondary | ICD-10-CM | POA: Diagnosis not present

## 2021-07-11 ENCOUNTER — Other Ambulatory Visit: Payer: Self-pay | Admitting: Family Medicine

## 2021-07-11 NOTE — Telephone Encounter (Signed)
She will be done with 5 y course in 3 months, then can stop it  ? ?Refilled times one  ?

## 2021-07-11 NOTE — Telephone Encounter (Signed)
First filled on 2018, last OV was 06/25/21, please advise  ?

## 2021-07-20 DIAGNOSIS — I4821 Permanent atrial fibrillation: Secondary | ICD-10-CM

## 2021-07-20 DIAGNOSIS — F4321 Adjustment disorder with depressed mood: Secondary | ICD-10-CM

## 2021-07-20 DIAGNOSIS — F418 Other specified anxiety disorders: Secondary | ICD-10-CM

## 2021-07-20 DIAGNOSIS — I4891 Unspecified atrial fibrillation: Secondary | ICD-10-CM

## 2021-07-25 ENCOUNTER — Ambulatory Visit (INDEPENDENT_AMBULATORY_CARE_PROVIDER_SITE_OTHER): Payer: Medicare Other

## 2021-07-25 ENCOUNTER — Telehealth: Payer: Self-pay

## 2021-07-25 DIAGNOSIS — I4430 Unspecified atrioventricular block: Secondary | ICD-10-CM | POA: Diagnosis not present

## 2021-07-25 LAB — CUP PACEART REMOTE DEVICE CHECK
Battery Remaining Longevity: 112 mo
Battery Voltage: 3.02 V
Brady Statistic AP VP Percent: 0 %
Brady Statistic AP VS Percent: 0 %
Brady Statistic AS VP Percent: 96.85 %
Brady Statistic AS VS Percent: 3.15 %
Brady Statistic RA Percent Paced: 0 %
Brady Statistic RV Percent Paced: 96.85 %
Date Time Interrogation Session: 20230505115614
Implantable Lead Implant Date: 20210507
Implantable Lead Location: 753860
Implantable Lead Model: 5076
Implantable Pulse Generator Implant Date: 20210507
Lead Channel Impedance Value: 3306 Ohm
Lead Channel Impedance Value: 3306 Ohm
Lead Channel Impedance Value: 380 Ohm
Lead Channel Impedance Value: 418 Ohm
Lead Channel Pacing Threshold Amplitude: 0.625 V
Lead Channel Pacing Threshold Pulse Width: 0.4 ms
Lead Channel Sensing Intrinsic Amplitude: 1.875 mV
Lead Channel Sensing Intrinsic Amplitude: 9.5 mV
Lead Channel Sensing Intrinsic Amplitude: 9.5 mV
Lead Channel Setting Pacing Amplitude: 2.5 V
Lead Channel Setting Pacing Pulse Width: 0.4 ms
Lead Channel Setting Sensing Sensitivity: 4 mV

## 2021-07-25 NOTE — Telephone Encounter (Signed)
Transmission received.

## 2021-07-28 ENCOUNTER — Ambulatory Visit (INDEPENDENT_AMBULATORY_CARE_PROVIDER_SITE_OTHER): Payer: BLUE CROSS/BLUE SHIELD | Admitting: *Deleted

## 2021-07-28 DIAGNOSIS — F4320 Adjustment disorder, unspecified: Secondary | ICD-10-CM

## 2021-07-28 DIAGNOSIS — F4321 Adjustment disorder with depressed mood: Secondary | ICD-10-CM

## 2021-07-28 DIAGNOSIS — I4821 Permanent atrial fibrillation: Secondary | ICD-10-CM

## 2021-07-28 DIAGNOSIS — F418 Other specified anxiety disorders: Secondary | ICD-10-CM

## 2021-07-28 DIAGNOSIS — I4891 Unspecified atrial fibrillation: Secondary | ICD-10-CM

## 2021-07-29 NOTE — Chronic Care Management (AMB) (Signed)
?Chronic Care Management  ? ? Clinical Social Work Note ? ?07/29/2021 ?Name: [name] MRN: [id number] DOB: 07/05/1932 ? ?[name] is a 88 y.o. year old female who is a primary care patient of Tower, Wynelle Fanny, MD. The CCM team was consulted to assist the patient with chronic disease management and/or care coordination needs related to: [employer]  and [place] and Resources.  ? ?Engaged with patient by telephone for follow up visit in response to provider referral for social work chronic care management and care coordination services.  ? ?Consent to Services:  ?The patient was given information about Chronic Care Management services, agreed to services, and gave verbal consent prior to initiation of services.  Please see initial visit note for detailed documentation.  ? ?Patient agreed to services and consent obtained.  ? ?Assessment: Review of patient past medical history, allergies, medications, and health status, including review of relevant consultants reports was performed today as part of a comprehensive evaluation and provision of chronic care management and care coordination services.    ? ?SDOH (Social Determinants of Health) assessments and interventions performed:   ? ?Advanced Directives Status: Not addressed in this encounter. ? ?CCM Care Plan ? ?Allergies  ?Allergen Reactions  ? Amiodarone Swelling  ?  SWELLING REACTION UNSPECIFIED   ? Penicillins Hives and Rash  ?  Has patient had a PCN reaction causing immediate rash, facial/tongue/throat swelling, SOB or lightheadedness with hypotension: No ?Has patient had a PCN reaction causing severe rash involving mucus membranes or skin necrosis: No ?Has patient had a PCN reaction that required hospitalization:Patient was inpatient when reaction occurred ?Has patient had a PCN reaction occurring within the last 10 years: No ?If all of the above answers are "NO", then may proceed with Cephalosporin use  ? Amiodarone Hcl Swelling  ?   SWELLING REACTION UNSPECIFIED   ? Statins Rash  ?  REACTION: rash  ? ? ?Outpatient Encounter Medications as of 07/28/2021  ?Medication Sig  ? acetaminophen (TYLENOL) 500 MG tablet Take 500 mg by mouth every 6 (six) hours as needed for moderate pain.  ? albuterol (VENTOLIN HFA) 108 (90 Base) MCG/ACT inhaler Inhale into the lungs every 6 (six) hours as needed for wheezing or shortness of breath.  ? alendronate (FOSAMAX) 70 MG tablet TAKE 1 TABLET EVERY 7 DAYS. TAKE WITH A FULL GLASS OF WATER ON AN EMPTY STOMACH.  ? budesonide (RHINOCORT AQUA) 32 MCG/ACT nasal spray Place into both nostrils daily.  ? cetirizine (ZYRTEC) 10 MG tablet Take 10 mg by mouth daily as needed for allergies.  ? diphenhydrAMINE-zinc acetate (BENADRYL) cream Apply 1 application topically 3 (three) times daily as needed for itching.  ? ferrous sulfate 325 (65 FE) MG EC tablet TAKE 1 TABLET EVERY DAY WITH BREAKFAST  ? furosemide (LASIX) 40 MG tablet Take 40 mg by mouth daily as needed for edema.  ? hydrOXYzine (ATARAX/VISTARIL) 25 MG tablet Take 1 tablet (25 mg total) by mouth every 6 (six) hours as needed for itching.  ? levothyroxine (SYNTHROID) 75 MCG tablet Take 1 tablet (75 mcg total) by mouth daily before breakfast.  ? Polyethyl Glycol-Propyl Glycol 0.4-0.3 % SOLN Place 1-2 drops into both eyes 3 (three) times daily as needed (for dry eyes.).  ? warfarin (COUMADIN) 5 MG tablet TAKE 1 TABLET BY MOUTH DAILY EXCEPT TAKE 1 AND 1/2 TABLETS ON WEDNESDAYS OR AS DIRECTED BY ANTICOAGULATION CLINIC  ? ?No facility-administered encounter medications on file as of 07/28/2021.  ? ? ?  Patient Active Problem List  ? Diagnosis Date Noted  ? Salt craving 06/25/2021  ? Brain fog 01/14/2021  ? Pressure in head 01/14/2021  ? 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/20/2020  ? Post-menopausal bleeding 11/20/2020  ? Facial paresthesia 10/14/2020  ? Insect bite 09/20/2020  ? Grief reaction 09/20/2020  ? Hearing loss 06/13/2020  ? Atrial fibrillation ([place]) 04/24/2020  ? Pelvic pain  03/11/2020  ? Routine general medical examination at a health care facility 03/11/2020  ? Elevated glucose 03/03/2020  ? Rectal bleeding 02/18/2020  ? History of TIA (transient ischemic attack) 02/18/2020  ? S/P AV nodal ablation 07/28/19 07/29/2019  ? S/P placement of cardiac pacemaker MDT 07/28/19 07/29/2019  ? AV block 07/28/2019  ? CVA (cerebral vascular accident) ([place]) 06/16/2019  ? Facial tingling 11/29/2018  ? Tremor of left hand 11/29/2018  ? Medicare annual wellness visit, subsequent 11/24/2018  ? Dysuria 02/06/2018  ? Dizzy 02/04/2018  ? Iron deficiency anemia 10/21/2017  ? Rapid atrial fibrillation ([place]) 08/19/2017  ? Constipation 08/02/2017  ? Numbness and tingling 07/14/2017  ? Paresthesia 07/14/2017  ? Unilateral primary osteoarthritis, left hip 05/04/2017  ? Status post total replacement of left hip 05/04/2017  ? Hip osteoarthritis 04/27/2017  ? Long term (current) use of anticoagulants 03/04/2017  ? Venous stasis dermatitis of both lower extremities 01/08/2017  ? Impacted cerumen of right ear 11/20/2016  ? Osteopenia 10/25/2016  ? Pedal edema 08/26/2016  ? Varicose veins of both lower extremities 08/26/2016  ? Estrogen deficiency 08/26/2016  ? Screening mammogram, encounter for 08/26/2016  ? Hemorrhoids 08/26/2016  ? History of nonmelanoma skin cancer 01/01/2016  ? Pruritus 10/04/2015  ? Urticaria 08/22/2014  ? Hip pain 08/02/2014  ? Left knee pain 08/02/2014  ? Chronic cough 05/08/2014  ? Hematochezia 04/09/2014  ? Colon cancer screening 08/16/2013  ? Encounter for therapeutic drug monitoring 04/20/2013  ? Left ovarian cyst 03/14/2013  ? Palpitations 04/15/2012  ? COLONIC POLYPS, ADENOMATOUS, HX OF 09/18/2009  ? PULMONARY NODULE 12/20/2008  ? GANGLION CYST 10/04/2007  ? Mixed incontinence 04/28/2007  ? Hyperlipidemia 04/27/2007  ? Depression with anxiety 04/27/2007  ? Asthma, mild intermittent 04/27/2007  ? INSOMNIA 04/27/2007  ? ADENOMATOUS COLONIC POLYP 11/04/2006  ? Hypothyroidism 09/02/2006  ?  Atrial fibrillation, chronic ([place]) 08/05/2006  ? ? ?Conditions to be addressed/monitored: Anxiety, Depression, and grief/loss ; Mental Health Concerns  ? ?Care Plan : LCSW Plan of Care  ?Updates made by Deirdre Peer, LCSW since 07/29/2021 12:00 AM  ?  ? ?Problem: Depression/Grief and Loss   ?Priority: High  ?  ? ?Long-Range Goal: Symptoms Monitored and Managed   ?Start Date: 06/06/2021  ?Expected End Date: 09/19/2021  ?This Visit's Progress: On track  ?Recent Progress: On track  ?Priority: High  ?Note:   ?Current Barriers:  ?Disease Management support and education needs related to Depression: depressed mood ?loss of energy/fatigue ?difficulty concentrating ?impaired memory ?Withdrawn/loneliness, Grief, and overall lack of enjoyment/activity ? ?CSW Clinical Goal(s):  ?Patient  will demonstrate a reduction in symptoms related to :Depression: depressed mood ?loss of energy/fatigue ?difficulty concentrating and Grief  ?patient will work with SW to address concerns related to depression. Isolation/loneliness   through collaboration with [place] representative, provider, and care team.  ? ?Interventions: ?07/29/21- Pt reports feeling better and being more active and involved with friends. Pt has been helping a friend inneed of rides to MD appointments as well as being more active/engaged. She is awaiting her appointment at [facility] set for 09/09/21.  ?She  has not scheduled a follow up counseling visit due to being so busy and feeling better.  ?06/23/21- pt reports going to her initial visit with counselor today. "It went well". She does not have a scheduled follow up and is aware she can decide and reach out to them as desired.  ?Pt is still anxiously awaiting her Neuro eval at [facility]- validated her feelings and offered support.  Pt shared she is not interested in any "groups" or support groups due to feeling it is hard to "not take on other peoples issues and vice versa.  ?Pt states she has been proactive with getting out and  being active; gardening, etc as well as doing daily brain exercises.  ?Offered support and to follow up next month ?06/09/21- CSW spoke with pt and updated her on appointment scheduled with [facility]

## 2021-07-29 NOTE — Patient Instructions (Signed)
Visit Information ? ?Thank you for taking time to visit with me today. Please don't hesitate to contact me if I can be of assistance to you before our next scheduled telephone appointment. ? ?Following are the goals we discussed today:  ?(Copy and paste patient goals from clinical care plan here) ? ?Our next appointment is by telephone on 09/13/21 ? ?Please call the care guide team at [phone] if you need to cancel or reschedule your appointment.  ? ?If you are experiencing a Mental Health or [place] or need someone to talk to, please call 1-800-273-TALK (toll free, 24 hour hotline) ?call 911  ? ?The patient verbalized understanding of instructions, educational materials, and care plan provided today and declined offer to receive copy of patient instructions, educational materials, and care plan.  ? ?Eduard Clos MSW, LCSW ?Licensed Clinical Social Worker ?[place]   ?[phone]  ?

## 2021-07-31 ENCOUNTER — Ambulatory Visit (INDEPENDENT_AMBULATORY_CARE_PROVIDER_SITE_OTHER): Payer: Medicare Other

## 2021-07-31 DIAGNOSIS — Z7901 Long term (current) use of anticoagulants: Secondary | ICD-10-CM | POA: Diagnosis not present

## 2021-07-31 LAB — POCT INR: INR: 2.5 (ref 2.0–3.0)

## 2021-07-31 NOTE — Patient Instructions (Addendum)
Pre visit review using our clinic review tool, if applicable. No additional management support is needed unless otherwise documented below in the visit note.  Continue 1 tablet daily . Recheck in 4 weeks.  

## 2021-07-31 NOTE — Progress Notes (Signed)
Continue 1 tablet daily. Recheck in 4 weeks.   

## 2021-08-07 NOTE — Progress Notes (Signed)
Remote pacemaker transmission.   

## 2021-08-20 DIAGNOSIS — F32A Depression, unspecified: Secondary | ICD-10-CM

## 2021-08-28 ENCOUNTER — Ambulatory Visit (INDEPENDENT_AMBULATORY_CARE_PROVIDER_SITE_OTHER): Payer: Medicare Other

## 2021-08-28 DIAGNOSIS — Z7901 Long term (current) use of anticoagulants: Secondary | ICD-10-CM | POA: Diagnosis not present

## 2021-08-28 LAB — POCT INR: INR: 3.4 — AB (ref 2.0–3.0)

## 2021-08-28 NOTE — Patient Instructions (Addendum)
Pre visit review using our clinic review tool, if applicable. No additional management support is needed unless otherwise documented below in the visit note.  Only take 1/2 tablet tomorrow and then change weekly dose to take 1 tablet daily except take 1/2 tablet on Wednesdays . Recheck in 4 weeks.

## 2021-08-28 NOTE — Progress Notes (Addendum)
Only take 1/2 tablet tomorrow and then change weekly dose to take 1 tablet daily except take 1/2 tablet on Wednesdays . Recheck in 4 weeks.

## 2021-09-09 DIAGNOSIS — R404 Transient alteration of awareness: Secondary | ICD-10-CM | POA: Diagnosis not present

## 2021-09-10 ENCOUNTER — Ambulatory Visit (INDEPENDENT_AMBULATORY_CARE_PROVIDER_SITE_OTHER): Payer: Medicare Other | Admitting: *Deleted

## 2021-09-10 DIAGNOSIS — R4189 Other symptoms and signs involving cognitive functions and awareness: Secondary | ICD-10-CM

## 2021-09-10 DIAGNOSIS — R519 Headache, unspecified: Secondary | ICD-10-CM

## 2021-09-10 DIAGNOSIS — F418 Other specified anxiety disorders: Secondary | ICD-10-CM

## 2021-09-10 DIAGNOSIS — F4321 Adjustment disorder with depressed mood: Secondary | ICD-10-CM

## 2021-09-10 NOTE — Chronic Care Management (AMB) (Signed)
Chronic Care Management    Clinical Social Work Note  09/10/2021 Name: [name] MRN: [id number] DOB: 26-Sep-1932  [name] is a 88 y.o. year old female who is a primary care patient of Tower, Wynelle Fanny, MD. The CCM team was consulted to assist the patient with chronic disease management and/or care coordination needs related to: Mental Health Counseling and Resources.   Engaged with patient by telephone for follow up visit in response to provider referral for social work chronic care management and care coordination services.   Consent to Services:  The patient was given information about Chronic Care Management services, agreed to services, and gave verbal consent prior to initiation of services.  Please see initial visit note for detailed documentation.   Patient agreed to services and consent obtained.   Assessment: Review of patient past medical history, allergies, medications, and health status, including review of relevant consultants reports was performed today as part of a comprehensive evaluation and provision of chronic care management and care coordination services.     SDOH (Social Determinants of Health) assessments and interventions performed:    Advanced Directives Status: Not addressed in this encounter.  CCM Care Plan  Allergies  Allergen Reactions   Amiodarone Swelling    SWELLING REACTION UNSPECIFIED    Penicillins Hives and Rash    Has patient had a PCN reaction causing immediate rash, facial/tongue/throat swelling, SOB or lightheadedness with hypotension: No Has patient had a PCN reaction causing severe rash involving mucus membranes or skin necrosis: No Has patient had a PCN reaction that required hospitalization:Patient was inpatient when reaction occurred Has patient had a PCN reaction occurring within the last 10 years: No If all of the above answers are "NO", then may proceed with Cephalosporin use   Amiodarone Hcl Swelling    SWELLING REACTION  UNSPECIFIED    Statins Rash    REACTION: rash    Outpatient Encounter Medications as of 09/10/2021  Medication Sig   acetaminophen (TYLENOL) 500 MG tablet Take 500 mg by mouth every 6 (six) hours as needed for moderate pain.   albuterol (VENTOLIN HFA) 108 (90 Base) MCG/ACT inhaler Inhale into the lungs every 6 (six) hours as needed for wheezing or shortness of breath.   alendronate (FOSAMAX) 70 MG tablet TAKE 1 TABLET EVERY 7 DAYS. TAKE WITH A FULL GLASS OF WATER ON AN EMPTY STOMACH.   budesonide (RHINOCORT AQUA) 32 MCG/ACT nasal spray Place into both nostrils daily.   cetirizine (ZYRTEC) 10 MG tablet Take 10 mg by mouth daily as needed for allergies.   diphenhydrAMINE-zinc acetate (BENADRYL) cream Apply 1 application topically 3 (three) times daily as needed for itching.   ferrous sulfate 325 (65 FE) MG EC tablet TAKE 1 TABLET EVERY DAY WITH BREAKFAST   furosemide (LASIX) 40 MG tablet Take 40 mg by mouth daily as needed for edema.   hydrOXYzine (ATARAX/VISTARIL) 25 MG tablet Take 1 tablet (25 mg total) by mouth every 6 (six) hours as needed for itching.   levothyroxine (SYNTHROID) 75 MCG tablet Take 1 tablet (75 mcg total) by mouth daily before breakfast.   Polyethyl Glycol-Propyl Glycol 0.4-0.3 % SOLN Place 1-2 drops into both eyes 3 (three) times daily as needed (for dry eyes.).   warfarin (COUMADIN) 5 MG tablet TAKE 1 TABLET BY MOUTH DAILY EXCEPT TAKE 1 AND 1/2 TABLETS ON WEDNESDAYS OR AS DIRECTED BY ANTICOAGULATION CLINIC   No facility-administered encounter medications on file as of 09/10/2021.    Patient Active Problem  List   Diagnosis Date Noted   Salt craving 06/25/2021   Brain fog 01/14/2021   Pressure in head 01/14/2021   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/20/2020   Post-menopausal bleeding 11/20/2020   Facial paresthesia 10/14/2020   Insect bite 09/20/2020   Grief reaction 09/20/2020   Hearing loss 06/13/2020   Atrial fibrillation ([place]) 04/24/2020   Pelvic pain 03/11/2020    Routine general medical examination at a health care facility 03/11/2020   Elevated glucose 03/03/2020   Rectal bleeding 02/18/2020   History of TIA (transient ischemic attack) 02/18/2020   S/P AV nodal ablation 07/28/19 07/29/2019   S/P placement of cardiac pacemaker MDT 07/28/19 07/29/2019   AV block 07/28/2019   CVA (cerebral vascular accident) ([place]) 06/16/2019   Facial tingling 11/29/2018   Tremor of left hand 11/29/2018   Medicare annual wellness visit, subsequent 11/24/2018   Dysuria 02/06/2018   Dizzy 02/04/2018   Iron deficiency anemia 10/21/2017   Rapid atrial fibrillation ([place]) 08/19/2017   Constipation 08/02/2017   Numbness and tingling 07/14/2017   Paresthesia 07/14/2017   Unilateral primary osteoarthritis, left hip 05/04/2017   Status post total replacement of left hip 05/04/2017   Hip osteoarthritis 04/27/2017   Long term (current) use of anticoagulants 03/04/2017   Venous stasis dermatitis of both lower extremities 01/08/2017   Impacted cerumen of right ear 11/20/2016   Osteopenia 10/25/2016   Pedal edema 08/26/2016   Varicose veins of both lower extremities 08/26/2016   Estrogen deficiency 08/26/2016   Screening mammogram, encounter for 08/26/2016   Hemorrhoids 08/26/2016   History of nonmelanoma skin cancer 01/01/2016   Pruritus 10/04/2015   Urticaria 08/22/2014   Hip pain 08/02/2014   Left knee pain 08/02/2014   Chronic cough 05/08/2014   Hematochezia 04/09/2014   Colon cancer screening 08/16/2013   Encounter for therapeutic drug monitoring 04/20/2013   Left ovarian cyst 03/14/2013   Palpitations 04/15/2012   COLONIC POLYPS, ADENOMATOUS, HX OF 09/18/2009   PULMONARY NODULE 12/20/2008   GANGLION CYST 10/04/2007   Mixed incontinence 04/28/2007   Hyperlipidemia 04/27/2007   Depression with anxiety 04/27/2007   Asthma, mild intermittent 04/27/2007   INSOMNIA 04/27/2007   ADENOMATOUS COLONIC POLYP 11/04/2006   Hypothyroidism 09/02/2006   Atrial  fibrillation, chronic ([place]) 08/05/2006    Conditions to be addressed/monitored: Depression; Mental Health Concerns   Care Plan : LCSW Plan of Care  Updates made by Deirdre Peer, LCSW since 09/10/2021 12:00 AM     Problem: Depression/Grief and Loss   Priority: High     Long-Range Goal: Symptoms Monitored and Managed Completed 09/10/2021  Start Date: 06/06/2021  Expected End Date: 09/19/2021  This Visit's Progress: On track  Recent Progress: On track  Priority: High  Note:   Current Barriers:  Disease Management support and education needs related to Depression: depressed mood loss of energy/fatigue difficulty concentrating impaired memory Withdrawn/loneliness, Grief, and overall lack of enjoyment/activity  CSW Clinical Goal(s):  Patient  will demonstrate a reduction in symptoms related to :Depression: depressed mood loss of energy/fatigue difficulty concentrating and Grief  patient will work with SW to address concerns related to depression. Isolation/loneliness   through collaboration with Clinical Social Worker, provider, and care team.   Interventions: 09/10/21- Spoke with pt today- she went to [facility] yesterday and saw the specialist who left her feeling "discouraged". Pt felt like nothing was resolved or answered and is now awaiting a visit with a "General Neurologist next week and then an EEG 7/11". Pt denies feeling suicidal;  stating, "I am a woman of faith". She is currently not interested in getting reconnected with a new provider for counseling at this time as she feels she has enough on her plate keeping her too busy. Pt wants to reach out to PCP to request further CSW support when/if she feels the need. Pt appreciative of the support and "listening to my burdens" and is reminded we are here to support her.  07/29/21- Pt reports feeling better and being more active and involved with friends. Pt has been helping a friend inneed of rides to MD appointments as well as  being more active/engaged. She is awaiting her appointment at [facility] set for 09/09/21.  She has not scheduled a follow up counseling visit due to being so busy and feeling better.  06/23/21- pt reports going to her initial visit with counselor today. "It went well". She does not have a scheduled follow up and is aware she can decide and reach out to them as desired.  Pt is still anxiously awaiting her Neuro eval at [facility]- validated her feelings and offered support.  Pt shared she is not interested in any "groups" or support groups due to feeling it is hard to "not take on other peoples issues and vice versa.  Pt states she has been proactive with getting out and being active; gardening, etc as well as doing daily brain exercises.  Offered support and to follow up next month 06/09/21- CSW spoke with pt and updated her on appointment scheduled with [facility] for 06/23/21.  "This is a big step for me...." CSW acknowledged and commended her for taking this big step. Pt voices loneliness, loss/grief and overall sense of apathy. She is agreeable to plans and will complete the paperwork for appointment online.    CSW spoke with pt who acknowledges feelings of depression; apathy, withdrawn, loneliness and overall "emotionally out of touch".  Pt is hesitant to introduce RX for her symptoms at this time and does not want add "happy pill".  She is interested in counseling. CSW completed the Depression Screening with pt; scoring "10". Pt shared with CSW she has had 3 "unexpected deaths"; losing a daughter, her husband and finding her son who had passed. "I did not get to say goodbye". She also reports having moments of the depression hitting her out of nowhere; "like at a stoplight". She denies SI/HI and has a strong faith as well as family and friends support.  Pt also has felt frustrated and saddened with her inability to do activities she use to enjoy; due to medical and vision issues. She is hopeful a visit to  Neurologist at [facility] in June may offer some answers and options surrounding the after effects she has from being shocked by her oven range.   CSW listened and validated her feelings and offered support and encouragement. CSW talked with pt about the cyclic effect of her medical issues impacting her depression and her depression impacting her medical issues.   Depression screen [facility] 2/9 06/06/2021 03/10/2021 03/08/2020 09/05/2018  Decreased Interest 2 0 0 0  Down, Depressed, Hopeless 1 0 0 0  PHQ - 2 Score 3 0 0 0  Altered sleeping 1 - 0 0  Tired, decreased energy 1 - 0 0  Change in appetite 0 - 0 0  Feeling bad or failure about yourself  2 - 0 0  Trouble concentrating 3 - 0 0  Moving slowly or fidgety/restless 0 - 0 0  Suicidal thoughts 0 -  0 0  PHQ-9 Score 10 - 0 0  Difficult doing work/chores Somewhat difficult - Not difficult at all Not difficult at all  Some recent data might be hidden    1:1 collaboration with primary care provider regarding development and update of comprehensive plan of care as evidenced by provider attestation and co-signature Inter-disciplinary care team collaboration (see longitudinal plan of care) Evaluation of current treatment plan related to  self management and patient's adherence to plan as established by provider Review resources, discussed options and provided patient information about  Hospice bereavement program(s)   Mental Health:  (Status: New goal.) Evaluation of current treatment plan related to Depression: depressed mood and Grief Depression screen reviewed  PHQ2/ PHQ9 completed Solution-Focused Strategies employed:  Active listening / Reflection utilized  Emotional Support Provided Provided psychoeducation for mental health needs   Task & activities to accomplish goals: Continue with compliance of taking medication  Continue with your self-care action plan  Follow up with  Counseling as you see best.             Follow Up Plan:  No  further CSW follow up needs at this time Eduard Clos MSW, [place] Licensed Clinical Social Worker [place]   [phone]

## 2021-09-10 NOTE — Patient Instructions (Signed)
Visit Information  Thank you for taking time to visit with me today. Please don't hesitate to contact me if I can be of assistance to you before If you are experiencing a Mental Health or [place] or need someone to talk to, please call the [place] National Suicide Prevention Lifeline: 234-287-1076 or TTY: (607) 194-7693 TTY 704-020-2025) to talk to a trained counselor call 911   Patient verbalizes understanding of instructions and care plan provided today and agrees to view in [place]. Active MyChart status and patient understanding of how to access instructions and care plan via MyChart confirmed with patient.     Eduard Clos MSW, LCSW Licensed Clinical Social Worker [place]   [phone]

## 2021-09-11 DIAGNOSIS — D23122 Other benign neoplasm of skin of left lower eyelid, including canthus: Secondary | ICD-10-CM | POA: Diagnosis not present

## 2021-09-12 ENCOUNTER — Telehealth: Payer: Medicare Other

## 2021-09-16 DIAGNOSIS — F418 Other specified anxiety disorders: Secondary | ICD-10-CM | POA: Diagnosis not present

## 2021-09-16 DIAGNOSIS — R413 Other amnesia: Secondary | ICD-10-CM | POA: Diagnosis not present

## 2021-09-16 DIAGNOSIS — Z7901 Long term (current) use of anticoagulants: Secondary | ICD-10-CM | POA: Diagnosis not present

## 2021-09-16 DIAGNOSIS — I63411 Cerebral infarction due to embolism of right middle cerebral artery: Secondary | ICD-10-CM | POA: Diagnosis not present

## 2021-09-16 DIAGNOSIS — Z950 Presence of cardiac pacemaker: Secondary | ICD-10-CM | POA: Diagnosis not present

## 2021-09-16 DIAGNOSIS — R4189 Other symptoms and signs involving cognitive functions and awareness: Secondary | ICD-10-CM | POA: Diagnosis not present

## 2021-09-16 DIAGNOSIS — I482 Chronic atrial fibrillation, unspecified: Secondary | ICD-10-CM | POA: Diagnosis not present

## 2021-09-19 DIAGNOSIS — F4321 Adjustment disorder with depressed mood: Secondary | ICD-10-CM

## 2021-09-19 DIAGNOSIS — F418 Other specified anxiety disorders: Secondary | ICD-10-CM

## 2021-09-25 ENCOUNTER — Ambulatory Visit (INDEPENDENT_AMBULATORY_CARE_PROVIDER_SITE_OTHER): Payer: Medicare Other

## 2021-09-25 DIAGNOSIS — Z7901 Long term (current) use of anticoagulants: Secondary | ICD-10-CM

## 2021-09-25 LAB — POCT INR: INR: 1.7 — AB (ref 2.0–3.0)

## 2021-09-25 NOTE — Progress Notes (Signed)
Pt reports she thought she was to be taking 1/2 tablet twice weekly and has been taking 1/2 tablet on Tuesdays and Thursdays. Pt was instructed at last apt to decrease 1/2 tablet to one day of the week but this was not done. Pt advised to increase dose today to take 1 1/2 tablet today and then change weekly dose to take 1/2 tablet once weekly and 1 tablet all other days. Pt verbalized understanding.   Increase dose today to take 1 1/2 tablets and the take 1 tablet daily except take 1/2 tablet on Thursdays . Recheck in 2 weeks.

## 2021-09-25 NOTE — Patient Instructions (Addendum)
Pre visit review using our clinic review tool, if applicable. No additional management support is needed unless otherwise documented below in the visit note.  Increase dose today to take 1 1/2 tablets and the take 1 tablet daily except take 1/2 tablet on Thursdays . Recheck in 2 weeks.

## 2021-10-08 ENCOUNTER — Emergency Department (HOSPITAL_COMMUNITY)
Admission: EM | Admit: 2021-10-08 | Discharge: 2021-10-08 | Disposition: A | Discharge status: Home / Self Care | Payer: Medicare Other | Attending: Student | Admitting: Student

## 2021-10-08 ENCOUNTER — Ambulatory Visit (INDEPENDENT_AMBULATORY_CARE_PROVIDER_SITE_OTHER): Payer: Medicare Other | Admitting: Family

## 2021-10-08 ENCOUNTER — Encounter: Payer: Self-pay | Admitting: Family

## 2021-10-08 ENCOUNTER — Other Ambulatory Visit: Payer: Self-pay

## 2021-10-08 ENCOUNTER — Telehealth: Payer: Self-pay | Admitting: Family Medicine

## 2021-10-08 ENCOUNTER — Emergency Department (HOSPITAL_COMMUNITY): Payer: Medicare Other

## 2021-10-08 VITALS — BP 124/76 | HR 72 | Temp 98.6°F | Resp 16 | Ht 64.0 in | Wt 166.5 lb

## 2021-10-08 DIAGNOSIS — E039 Hypothyroidism, unspecified: Secondary | ICD-10-CM

## 2021-10-08 DIAGNOSIS — Z7901 Long term (current) use of anticoagulants: Secondary | ICD-10-CM | POA: Insufficient documentation

## 2021-10-08 DIAGNOSIS — D696 Thrombocytopenia, unspecified: Secondary | ICD-10-CM | POA: Diagnosis not present

## 2021-10-08 DIAGNOSIS — R0789 Other chest pain: Secondary | ICD-10-CM | POA: Diagnosis not present

## 2021-10-08 DIAGNOSIS — R791 Abnormal coagulation profile: Secondary | ICD-10-CM | POA: Insufficient documentation

## 2021-10-08 DIAGNOSIS — I10 Essential (primary) hypertension: Secondary | ICD-10-CM | POA: Diagnosis not present

## 2021-10-08 DIAGNOSIS — R531 Weakness: Secondary | ICD-10-CM | POA: Diagnosis not present

## 2021-10-08 DIAGNOSIS — R5383 Other fatigue: Secondary | ICD-10-CM

## 2021-10-08 DIAGNOSIS — R002 Palpitations: Secondary | ICD-10-CM | POA: Diagnosis not present

## 2021-10-08 DIAGNOSIS — R079 Chest pain, unspecified: Secondary | ICD-10-CM | POA: Diagnosis not present

## 2021-10-08 LAB — TROPONIN I (HIGH SENSITIVITY)
Troponin I (High Sensitivity): 10 ng/L (ref <18)
Troponin I (High Sensitivity): 11 ng/L (ref <18)

## 2021-10-08 LAB — BASIC METABOLIC PANEL
Anion gap: 11 (ref 5–15)
BUN: 22 mg/dL (ref 8–23)
CO2: 24 mmol/L (ref 22–32)
Calcium: 9.3 mg/dL (ref 8.9–10.3)
Chloride: 108 mmol/L (ref 98–111)
Creatinine, Ser: 0.67 mg/dL (ref 0.44–1.00)
GFR, Estimated: >60 mL/min (ref >60)
Glucose, Bld: 94 mg/dL (ref 70–99)
Potassium: 4 mmol/L (ref 3.5–5.1)
Sodium: 143 mmol/L (ref 135–145)

## 2021-10-08 LAB — PROTIME-INR
INR: 1.5 — ABNORMAL HIGH (ref 0.8–1.2)
Prothrombin Time: 17.5 seconds — ABNORMAL HIGH (ref 11.4–15.2)

## 2021-10-08 LAB — CBC
HCT: 41.2 % (ref 36.0–46.0)
Hemoglobin: 13.1 g/dL (ref 12.0–15.0)
MCH: 28.5 pg (ref 26.0–34.0)
MCHC: 31.8 g/dL (ref 30.0–36.0)
MCV: 89.6 fL (ref 80.0–100.0)
Platelets: 124 10*3/uL — ABNORMAL LOW (ref 150–400)
RBC: 4.6 MIL/uL (ref 3.87–5.11)
RDW: 14.5 % (ref 11.5–15.5)
WBC: 4.7 10*3/uL (ref 4.0–10.5)
nRBC: 0 % (ref 0.0–0.2)

## 2021-10-08 LAB — MAGNESIUM: Magnesium: 2.2 mg/dL (ref 1.7–2.4)

## 2021-10-08 LAB — TSH: TSH: 2.436 u[IU]/mL (ref 0.350–4.500)

## 2021-10-08 NOTE — ED Triage Notes (Addendum)
Pt woke up with feeling of impending doom, slight palpitations and feeling overwhelmed and went to pcp who sent her here due to concerning ekg. Denies chest pain, shortness of breath, n/v. 324 aspirin given pta  HR 70, BP 150/80, Spo 98% RA

## 2021-10-08 NOTE — ED Provider Notes (Signed)
[facility] EMERGENCY DEPARTMENT Provider Note   CSN: [id number] Arrival date & time: 10/08/21  1258     History  Chief Complaint  Patient presents with   Palpitations         [name] is a 88 y.o. female.   Palpitations   Patient presents from PCP office to ED via EMS due to palpitations/impending sense of doom.  Patient states this morning she took all of her home medicine, she states she felt palpitations in her chest moving up to her throat.  Denies any specific chest pain or shortness of breath but had an overwhelming sense of impending doom, states she felt very sad.  Seen by primary sent her to ED for cardiac work-up due to concerns ST elevations on EKG done in the office.  Patient denies any recent changes in her medication.  She lives independently, she as well as her husband and her 2 sons.  Her son died a year ago unexpectedly.  Patient does feel supported at home and attends a church.  She has a trip she is looking forward to in August.  Denies SI.  Past medical history is notable for paced rhythm on warfarin, A-fib, hypothyroid, hyperlipidemia, history of TIA.  Home Medications Prior to Admission medications   Medication Sig Start Date End Date Taking? Authorizing Provider  acetaminophen (TYLENOL) 500 MG tablet Take 500 mg by mouth every 6 (six) hours as needed for moderate pain.    [provider]  albuterol (VENTOLIN HFA) 108 (90 Base) MCG/ACT inhaler Inhale into the lungs every 6 (six) hours as needed for wheezing or shortness of breath.    [provider]  alendronate (FOSAMAX) 70 MG tablet TAKE 1 TABLET EVERY 7 DAYS. TAKE WITH A FULL GLASS OF WATER ON AN EMPTY STOMACH. 07/11/21   Tower, Wynelle Fanny, MD  budesonide (RHINOCORT AQUA) 32 MCG/ACT nasal spray Place into both nostrils daily.    [provider]  diphenhydrAMINE-zinc acetate (BENADRYL) cream Apply 1 application topically 3 (three) times daily as needed for  itching.    [provider]  ferrous sulfate 325 (65 FE) MG EC tablet TAKE 1 TABLET EVERY DAY WITH BREAKFAST 04/28/21   Tower, Wynelle Fanny, MD  furosemide (LASIX) 40 MG tablet Take 40 mg by mouth daily as needed for edema.    [provider]  hydrOXYzine (ATARAX/VISTARIL) 25 MG tablet Take 1 tablet (25 mg total) by mouth every 6 (six) hours as needed for itching. 12/27/20   Kennith Gain, MD  levothyroxine (SYNTHROID) 75 MCG tablet Take 1 tablet (75 mcg total) by mouth daily before breakfast. 12/25/20   Tower, Wynelle Fanny, MD  Polyethyl Glycol-Propyl Glycol 0.4-0.3 % SOLN Place 1-2 drops into both eyes 3 (three) times daily as needed (for dry eyes.).    [provider]  warfarin (COUMADIN) 5 MG tablet TAKE 1 TABLET BY MOUTH DAILY EXCEPT TAKE 1 AND 1/2 TABLETS ON WEDNESDAYS OR AS DIRECTED BY ANTICOAGULATION CLINIC 03/05/21   Tower, Wynelle Fanny, MD      Allergies    Amiodarone, Penicillins, Amiodarone hcl, and Statins    Review of Systems   Review of Systems  Cardiovascular:  Positive for palpitations.    Physical Exam Updated Vital Signs BP (!) 167/79   Pulse 69   Temp 97.8 F (36.6 C) (Oral)   Resp 19   Ht '5\' 4"'$  (1.626 m)   Wt 75.5 kg   SpO2 98%   BMI 28.58  kg/m  Physical Exam Vitals and nursing note reviewed. Exam conducted with a chaperone present.  Constitutional:      Appearance: Normal appearance.     Comments: Tearful affect  HENT:     Head: Normocephalic and atraumatic.  Eyes:     General: No scleral icterus.       Right eye: No discharge.        Left eye: No discharge.     Extraocular Movements: Extraocular movements intact.     Pupils: Pupils are equal, round, and reactive to light.  Cardiovascular:     Rate and Rhythm: Normal rate and regular rhythm.     Pulses: Normal pulses.     Heart sounds: Normal heart sounds. No murmur heard.    No friction rub. No gallop.  Pulmonary:     Effort: Pulmonary effort is normal. No respiratory  distress.     Breath sounds: Normal breath sounds.  Abdominal:     General: Abdomen is flat. Bowel sounds are normal. There is no distension.     Palpations: Abdomen is soft.     Tenderness: There is no abdominal tenderness.  Skin:    General: Skin is warm and dry.     Coloration: Skin is not jaundiced.  Neurological:     Mental Status: She is alert. Mental status is at baseline.     Coordination: Coordination normal.     ED Results / Procedures / Treatments   Labs (all labs ordered are listed, but only abnormal results are displayed) Labs Reviewed  CBC - Abnormal; Notable for the following components:      Result Value   Platelets 124 (*)    All other components within normal limits  PROTIME-INR - Abnormal; Notable for the following components:   Prothrombin Time 17.5 (*)    INR 1.5 (*)    All other components within normal limits  BASIC METABOLIC PANEL  TSH  MAGNESIUM  TROPONIN I (HIGH SENSITIVITY)  TROPONIN I (HIGH SENSITIVITY)    EKG None  Radiology DG Chest Port 1 View  Result Date: 10/08/2021 CLINICAL DATA:  Chest pain EXAM: PORTABLE CHEST 1 VIEW COMPARISON:  07/29/2019 FINDINGS: There is a left chest wall pacer device with lead in the right ventricle. Previous median sternotomy. No pleural effusion identified. No airspace consolidation. The visualized osseous structures are unremarkable. IMPRESSION: No acute cardiopulmonary abnormalities. Electronically Signed   By: Kerby Moors M.D.   On: 10/08/2021 13:47    Procedures Procedures    Medications Ordered in ED Medications - No data to display  ED Course/ Medical Decision Making/ A&P Clinical Course as of 10/08/21 1621  Wed Oct 08, 2021  1320 06/25/21 - stable TSH per chart review [HS]    Clinical Course User Index [HS] Sherrill Raring, PA-C                           Medical Decision Making Amount and/or Complexity of Data Reviewed Labs: ordered. Radiology: ordered.   Patient presents due to chest  pressure/palpitations/impending sense of doom.  Differential is broad and includes but not limited to ACS, pneumonia, PE, CHF, electrolyte derangement, TSH.  Physical exam is unremarkable, patient is slightly hypertensive but vitals otherwise stable.  -BP (!) 174/90 (BP Location: Right Arm)   Pulse 70   Temp 97.8 F (36.6 C) (Oral)   Resp 16   Ht '5\' 4"'$  (1.626 m)   Wt 75.5 kg   SpO2 97%  BMI 28.58 kg/m   I ordered, viewed, and interpreted laboratory workup: -PT/INR levels subtherapeutic at 1.5.  I consulted with pharmacy, pharmacist recommends 7-1/2 mg today, following up with anticoag clinic tomorrow as scheduled. -No leukocytosis or anemia, mild thrombocytopenia with a platelet count of 124.  Magnesium is within normal limits.  No gross electrolyte derangement or AKI.  TSH is within normal limits.   -Initial troponin low at 10, second troponin 11.  I ordered and viewed chest x-ray.  Pacemaker placed, no acute findings.  Agree with radiologist interpretation.  I ordered and reviewed EKG patient.  Patient has a paced rhythm, do not appreciate any ST elevation.  Compared to previous EKGs no ST elevation and roughly unchanged.  Patient is on cardiac monitoring and is in a paced rhythm.  On reevaluation patient is well-appearing, no return of symptoms.  I considered admission but patient is very well-appearing and there is no signs of ACS.  Her blood work is reassuring, I do not think she needs hospitalization at this time.  Discussed HPI, physical exam and plan of care for this patient with attending [facility]. The attending physician evaluated this patient as part of a shared visit and agrees with plan of care.         Final Clinical Impression(s) / ED Diagnoses Final diagnoses:  None    Rx / DC Orders ED Discharge Orders     None         Sherrill Raring, Hershal Coria 10/08/21 1735    Teressa Lower, MD 10/08/21 2217

## 2021-10-08 NOTE — ED Notes (Signed)
Patient verbalizes understanding of discharge instructions. Opportunity for questioning and answers were provided. Armband removed by staff, pt discharged from ED.  

## 2021-10-08 NOTE — Discharge Instructions (Signed)
Follow-up with your primary in the next 2 days for reevaluation.  If her symptoms return or worsen obviously come back to the ED.  Your work-up today was really reassuring.

## 2021-10-08 NOTE — Assessment & Plan Note (Signed)
ekg today with ST elevation  Paced  Pt with impending feeling of doom ,chest pressure heaviness Called 911, pt not comfortable with driving.

## 2021-10-08 NOTE — Progress Notes (Signed)
Established Patient Office Visit  Subjective:  Patient ID: [name], female    DOB: [birth year]  Age: 88 y.o. MRN: [id number]  CC:  Chief Complaint  Patient presents with   Fatigue    Feeling tight in throat. Feeling overwhelmed. Legs feel heavy. Feels like she wants to lie down and die. Feeling sad.    HPI [name] is here today with concerns.   Woke up this am and felt impending doom' felt incredibly tired this am and had a cup of coffee which didn't help. Then she felt queezy in her throat, and felt weaker and weaker. She thought maybe thyroid out of wack. As before she felt increased fatigue she had issue with thyroid and increased dosage of thyroid.   She does have a pacemaker in place, denies chest pain.   Her whole body feels heavy and weak.  No urinary frequency urgency no burning.  No diarrhea.  No fever or chills.   She denies si or hi she just has this feeling that something bad is about to happen.   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 s/p PVI [name] 2010;  b. on tikosyn/coumadin;  c. 05/2009 Echo: EF 60-65%, Gr 2 DD. (first dx 09/ 2007)   Bilateral lower extremity edema    Bleeding hemorrhoid    Carotid stenosis    mild (hosp 3/11)- consult by vasc/ Dr Donnetta Hutching   Complication of anesthesia    hard to wake   Diverticulosis of colon    Dyspnea    on exertion-climbing stairs   Fatty liver    H/O cardiac radiofrequency ablation    01/ 2008 at [facility] /  03/ 2010  at [facility] failure with preserved ejection fraction [facility])    History of adenomatous polyp of colon    tubular adenoma's   History of cardiomyopathy    secondary tachycardia-induced cardiomyopathy -- resolved 2014   History of squamous cell carcinoma in situ (SCCIS) of skin    05/ 2017  nasal  bridge and right medial knee   History of transient ischemic attack (TIA)    01-24-2005 and 06-12-2009   Hyperlipidemia    Hypothyroidism    Mild intermittent asthma    reacts to cats   Mixed stress and urge urinary incontinence    Presence of permanent cardiac pacemaker    was put in 07/2019   Pulmonary nodule    S/P AV nodal ablation 07/28/19 07/29/2019   S/P mitral valve repair 10-23-1998  dr Boyce Medici at [facility]   for MVP and regurg. (annuloplasty ring procedure)   S/P placement of cardiac pacemaker MDT 07/28/19 07/29/2019    Past Surgical History:  Procedure Laterality Date   APPENDECTOMY  1978   AV NODE ABLATION N/A 07/28/2019   Procedure: AV NODE ABLATION;  Surgeon: Deboraha Sprang, MD;  Location: [place] CV LAB;  Service: Cardiovascular;  Laterality: N/A;   BUBBLE STUDY  06/19/2019   Procedure: BUBBLE STUDY;  Surgeon: Pixie Casino, MD;  Location: [facility] ENDOSCOPY;  Service: Cardiovascular;;   CARDIAC ELECTROPHYSIOLOGY [place]  01/ 2008    at [place]   right-sided ablation atrial flutter   CARDIAC ELECTROPHYSIOLOGY STUDY AND ABLATION  03/  2010   dr Jaymes Graff at [name] node ablation and pulmonary vein isolation for atrial fib   CARDIOVERSION  06-18-2006;  07-13-2006;  10-19-2010;  10-27-2010   CATARACT EXTRACTION W/PHACO Right 04/09/2021   Procedure: CATARACT EXTRACTION PHACO AND INTRAOCULAR LENS PLACEMENT ([place]) RIGHT 6.71 01:11.1;  Surgeon: Leandrew Koyanagi, MD;  Location: [place];  Service: Ophthalmology;  Laterality: Right;   CATARACT EXTRACTION W/PHACO Left 04/23/2021   Procedure: CATARACT EXTRACTION PHACO AND INTRAOCULAR LENS PLACEMENT ([place]) LEFT;  Surgeon: Leandrew Koyanagi, MD;  Location: [place];  Service: Ophthalmology;  Laterality: Left;  [place] 5.65 00:50.1   COLONOSCOPY     COLONOSCOPY WITH PROPOFOL N/A 10/13/2017   Procedure: COLONOSCOPY WITH PROPOFOL;  Surgeon: [name], MD;  [facility]  ENDOSCOPY;  Service: Gastroenterology;  Laterality: N/A;   COLONOSCOPY WITH PROPOFOL N/A 02/20/2020   Procedure: COLONOSCOPY WITH PROPOFOL;  Surgeon: Lesly Rubenstein, MD;  Location: [facility] ENDOSCOPY;  Service: Endoscopy;  Laterality: N/A;   CYSTO/ TRANSURETHRAL COLLAGEN INJECTION THERAPY  07-26-2007   dr Matilde Sprang   DILATION AND CURETTAGE OF UTERUS     ESOPHAGOGASTRODUODENOSCOPY (EGD) WITH PROPOFOL N/A 10/13/2017   Procedure: ESOPHAGOGASTRODUODENOSCOPY (EGD) WITH PROPOFOL;  Surgeon: [name], MD;  Location: [facility] ENDOSCOPY;  Service: Gastroenterology;  Laterality: N/A;   EVALUATION UNDER ANESTHESIA WITH HEMORRHOIDECTOMY N/A 04/09/2020   Procedure: EXAM UNDER ANESTHESIA WITH HEMORRHOIDECTOMY;  Surgeon: Jules Husbands, MD;  Location: [facility] ORS;  Service: General;  Laterality: N/A;   EXCISIONAL HEMORRHOIDECTOMY  1980s   GIVENS CAPSULE STUDY N/A 12/08/2017   Procedure: GIVENS CAPSULE STUDY;  Surgeon: [name], MD;  Location: [facility] ENDOSCOPY;  Service: Gastroenterology;  Laterality: N/A;   HEMORRHOID SURGERY N/A 10/29/2016   Procedure: HEMORRHOIDECTOMY;  Surgeon: Leighton Ruff, MD;  Location: [facility];  Service: General;  Laterality: N/A;   MITRAL VALVE ANNULOPLASTY  10/23/1998   "Model 4625; [employer] [id number]"; [facility] 28[facility]  PACEMAKER IMPLANT N/A 07/28/2019   Procedure: PACEMAKER IMPLANT;  Surgeon: KDeboraha Sprang MD;  [place];  Service: Cardiovascular;  Laterality: N/A;   [place]  TEE WITH [facility]ION  [facility]  [facility]  [facility]E WITHOUT CARDIOVERSION N/A 06/19/2019   Procedure: TRANSESOPHAGEAL ECHOCARDIOGRAM (TEE);  Surgeon: HPixie Casino MD;  [facility];  Service: Cardiovascular;  Laterality: N/A;   TOTAL HIP ARTHROPLASTY Left 05/04/2017   Procedure: LEFT TOTAL HIP ARTHROPLASTY ANTERIOR APPROACH;  Surgeon: BMcarthur Rossetti MD;  [place]  Service:  Orth[place]ics;  Laterality: Left;   TRANSTHORACIC ECHOCARDIOGRAM  05-01-2015   dr kCaryl Comes  ef 50-55%/  mild AV sclerosis without stenosis/  post MV repair with mild central MR (valve area by pressure half-time 2cm^2,  valve area by continutity equation 0.91cm^2, peak grandiant 846mg)/  severe LAE/ mild TR/ mild RAE    TUBAL LIGATION Bilateral 1978    Family History  Problem Relation Age of Onset   Lung cancer Father        smoker, died at 8051 Alcohol abuse Father    Cancer Father        bladder and lung CA smoker   Sudden death Other    Breast cancer Neg Hx    Stroke Neg Hx     Social History   Socioeconomic History   Marital status: Widowed    Spouse name: Not on file   Number of children: 6  Years of education: Not on file   Highest education level: Not on file  Occupational History   Occupation: realtor    Employer: RETIRED  Tobacco Use   Smoking status: Never   Smokeless tobacco: Never  Vaping Use   Vaping Use: Never used  Substance and Sexual Activity   Alcohol use: Not Currently    Comment: seldom   Drug use: No   Sexual activity: Not Currently  Other Topics Concern   Not on file  Social History Narrative   Retired. Daily Caffeine use: 2 daily    Social Determinants of Health   Financial Resource Strain: Low Risk  (03/10/2021)   Overall Financial Resource Strain (CARDIA)    Difficulty of Paying Living Expenses: Not hard at all  Food Insecurity: No Food Insecurity (03/10/2021)   Hunger Vital Sign    Worried About Running Out of Food in the Last Year: Never true    Ran Out of Food in the Last Year: Never true  Transportation Needs: No Transportation Needs (06/06/2021)   PRAPARE - Hydrologist (Medical): No    Lack of Transportation (Non-Medical): No  Physical Activity: Inactive (03/10/2021)   Exercise Vital Sign    Days of Exercise per Week: 0 days    Minutes of Exercise per Session: 0 min  Stress: No Stress Concern Present  (03/10/2021)   [place]    Feeling of Stress : Only a little  Social Connections: Moderately Integrated (03/10/2021)   Social Connection and Isolation Panel [NHANES]    Frequency of Communication with Friends and Family: More than three times a week    Frequency of Social Gatherings with Friends and Family: Three times a week    Attends Religious Services: More than 4 times per year    Active Member of Clubs or Organizations: Yes    Attends Archivist Meetings: More than 4 times per year    Marital Status: Widowed  Intimate Partner Violence: Not At Risk (03/10/2021)   Humiliation, Afraid, Rape, and Kick questionnaire    Fear of Current or Ex-Partner: No    Emotionally Abused: No    Physically Abused: No    Sexually Abused: No    Outpatient Medications Prior to Visit  Medication Sig Dispense Refill   acetaminophen (TYLENOL) 500 MG tablet Take 500 mg by mouth every 6 (six) hours as needed for moderate pain.     albuterol (VENTOLIN HFA) 108 (90 Base) MCG/ACT inhaler Inhale into the lungs every 6 (six) hours as needed for wheezing or shortness of breath.     alendronate (FOSAMAX) 70 MG tablet TAKE 1 TABLET EVERY 7 DAYS. TAKE WITH A FULL GLASS OF WATER ON AN EMPTY STOMACH. 12 tablet 0   budesonide (RHINOCORT AQUA) 32 MCG/ACT nasal spray Place into both nostrils daily.     diphenhydrAMINE-zinc acetate (BENADRYL) cream Apply 1 application topically 3 (three) times daily as needed for itching.     ferrous sulfate 325 (65 FE) MG EC tablet TAKE 1 TABLET EVERY DAY WITH BREAKFAST 90 tablet 1   furosemide (LASIX) 40 MG tablet Take 40 mg by mouth daily as needed for edema.     hydrOXYzine (ATARAX/VISTARIL) 25 MG tablet Take 1 tablet (25 mg total) by mouth every 6 (six) hours as needed for itching. 30 tablet 0   levothyroxine (SYNTHROID) 75 MCG tablet Take 1 tablet (75 mcg total) by mouth daily before breakfast. 90 tablet  1    Polyethyl Glycol-Propyl Glycol 0.4-0.3 % SOLN Place 1-2 drops into both eyes 3 (three) times daily as needed (for dry eyes.).     warfarin (COUMADIN) 5 MG tablet TAKE 1 TABLET BY MOUTH DAILY EXCEPT TAKE 1 AND 1/2 TABLETS ON WEDNESDAYS OR AS DIRECTED BY ANTICOAGULATION CLINIC 100 tablet 1   cetirizine (ZYRTEC) 10 MG tablet Take 10 mg by mouth daily as needed for allergies. (Patient not taking: Reported on 10/08/2021)     No facility-administered medications prior to visit.    Allergies  Allergen Reactions   Amiodarone Swelling    SWELLING REACTION UNSPECIFIED    Penicillins Hives and Rash    Has patient had a PCN reaction causing immediate rash, facial/tongue/throat swelling, SOB or lightheadedness with hypotension: No Has patient had a PCN reaction causing severe rash involving mucus membranes or skin necrosis: No Has patient had a PCN reaction that required hospitalization:Patient was inpatient when reaction occurred Has patient had a PCN reaction occurring within the last 10 years: No If all of the above answers are "NO", then may proceed with Cephalosporin use   Amiodarone Hcl Swelling    SWELLING REACTION UNSPECIFIED    Statins Rash    REACTION: rash        Objective:    Physical Exam Constitutional:      General: She is not in acute distress.    Appearance: Normal appearance. She is normal weight. She is not ill-appearing, toxic-appearing or diaphoretic.  Cardiovascular:     Rate and Rhythm: Normal rate and regular rhythm.  Pulmonary:     Effort: Pulmonary effort is normal.  Neurological:     General: No focal deficit present.     Mental Status: She is alert and oriented to person, place, and time.  Psychiatric:        Mood and Affect: Mood normal.        Behavior: Behavior normal.        Thought Content: Thought content normal.        Judgment: Judgment normal.     BP 124/76   Pulse 72   Temp 98.6 F (37 C)   Resp 16   Ht '5\' 4"'$  (1.626 m)   Wt 166 lb 8 oz  (75.5 kg)   SpO2 97%   BMI 28.58 kg/m  Wt Readings from Last 3 Encounters:  10/08/21 166 lb 8 oz (75.5 kg)  06/25/21 164 lb (74.4 kg)  04/23/21 160 lb (72.6 kg)     Health Maintenance Due  Topic Date Due   Zoster Vaccines- Shingrix (1 of 2) Never done   MAMMOGRAM  04/24/2020    There are no preventive care reminders to display for this patient.  Lab Results  Component Value Date   TSH 3.72 06/25/2021   Lab Results  Component Value Date   WBC 5.0 03/28/2021   HGB 12.3 03/28/2021   HCT 37.5 03/28/2021   MCV 88.2 03/28/2021   PLT 151 03/28/2021   Lab Results  Component Value Date   NA 141 06/25/2021   K 4.1 06/25/2021   CO2 27 06/25/2021   GLUCOSE 81 06/25/2021   BUN 26 (H) 06/25/2021   CREATININE 0.71 06/25/2021   BILITOT 0.7 03/02/2021   ALKPHOS 42 03/02/2021   AST 24 03/02/2021   ALT 18 03/02/2021   PROT 7.4 03/02/2021   ALBUMIN 3.9 03/02/2021   CALCIUM 9.1 06/25/2021   ANIONGAP 8 03/02/2021   GFR 76.86 06/25/2021   Lab Results  Component Value Date   HGBA1C 5.8 06/25/2021      Assessment & Plan:   Problem List Items Addressed This Visit       Endocrine   Hypothyroidism     Other   Chest pressure    ekg today with ST elevation  Paced  Pt with impending feeling of doom ,chest pressure heaviness Called 911, pt not comfortable with driving.       Relevant Orders   EKG 12-Lead (Completed)   Weakness   Other fatigue - Primary   Relevant Orders   EKG 12-Lead (Completed)    No orders of the defined types were placed in this encounter.   Follow-up: No follow-ups on file.    Eugenia Pancoast, FNP

## 2021-10-09 ENCOUNTER — Ambulatory Visit: Payer: Medicare Other

## 2021-10-14 ENCOUNTER — Telehealth: Payer: Self-pay

## 2021-10-14 NOTE — Progress Notes (Signed)
    Chronic Care Management Pharmacy Assistant   Name: [name]  MRN: [id number] DOB: 09/08/1932  Reason for Encounter: CCM [facility] Follow Up)   Medications: Outpatient Encounter Medications as of 10/14/2021  Medication Sig   acetaminophen (TYLENOL) 500 MG tablet Take 500 mg by mouth every 6 (six) hours as needed for moderate pain.   albuterol (VENTOLIN HFA) 108 (90 Base) MCG/ACT inhaler Inhale into the lungs every 6 (six) hours as needed for wheezing or shortness of breath.   alendronate (FOSAMAX) 70 MG tablet TAKE 1 TABLET EVERY 7 DAYS. TAKE WITH A FULL GLASS OF WATER ON AN EMPTY STOMACH.   budesonide (RHINOCORT AQUA) 32 MCG/ACT nasal spray Place into both nostrils daily.   diphenhydrAMINE-zinc acetate (BENADRYL) cream Apply 1 application topically 3 (three) times daily as needed for itching.   ferrous sulfate 325 (65 FE) MG EC tablet TAKE 1 TABLET EVERY DAY WITH BREAKFAST   furosemide (LASIX) 40 MG tablet Take 40 mg by mouth daily as needed for edema.   hydrOXYzine (ATARAX/VISTARIL) 25 MG tablet Take 1 tablet (25 mg total) by mouth every 6 (six) hours as needed for itching.   levothyroxine (SYNTHROID) 75 MCG tablet Take 1 tablet (75 mcg total) by mouth daily before breakfast.   Polyethyl Glycol-Propyl Glycol 0.4-0.3 % SOLN Place 1-2 drops into both eyes 3 (three) times daily as needed (for dry eyes.).   warfarin (COUMADIN) 5 MG tablet TAKE 1 TABLET BY MOUTH DAILY EXCEPT TAKE 1 AND 1/2 TABLETS ON WEDNESDAYS OR AS DIRECTED BY ANTICOAGULATION CLINIC   No facility-administered encounter medications on file as of 10/14/2021.   Reviewed hospital notes for details of recent visit. Patient has been contacted by Transitions of Care team: No  Admitted to the ED on 10/08/2021. Discharge date was 10/08/2021.  Discharged from [facility].   Discharge diagnosis (Principal Problem): Palpitations Patient was discharged to Home  Brief summary of hospital course: Patient seen Emergency  Department for evaluation of palpitations and a "abnormal EKG" seen in patient's primary care office.  Patient awoke with anxiety, palpitations and impending sense of doom.  While at the primary care office, patient was found to have apparent ST elevations in lead V3 V4 and was sent to the emergency department for cardiac work-up.  Patient has a pacemaker in place and the EKG in question appears to be identical to all of her previous ECGs with a pacemaker in place and her rhythm is not consistent with there is Sgarbossa positive STEMI and is instead consistent with her paced ventricular rhythm.  Of note, 1 year ago today her son died unexpectedly and may be contributing to the patient's palpitations and anxiety this morning.  Patient is asymptomatic here in the emergency department is safe for discharge with outpatient follow-up.  Medications that remain the same after Hospital Discharge:??  -All other medications will remain the same.    Next CCM appt: 05/27/2022  Other upcoming appts: No appointments scheduled within the next 30 days.  Charlene Brooke, CPP notified  Marijean Niemann, [place] Clinical Pharmacy Assistant [phone]  Charlene Brooke, PharmD notified and will determine if action is needed.

## 2021-10-16 ENCOUNTER — Ambulatory Visit (INDEPENDENT_AMBULATORY_CARE_PROVIDER_SITE_OTHER): Payer: Medicare Other

## 2021-10-16 DIAGNOSIS — Z7901 Long term (current) use of anticoagulants: Secondary | ICD-10-CM

## 2021-10-16 DIAGNOSIS — H903 Sensorineural hearing loss, bilateral: Secondary | ICD-10-CM | POA: Diagnosis not present

## 2021-10-16 LAB — POCT INR: INR: 2.6 (ref 2.0–3.0)

## 2021-10-16 NOTE — Patient Instructions (Addendum)
Pre visit review using our clinic review tool, if applicable. No additional management support is needed unless otherwise documented below in the visit note.  Continue 1 tablet daily . Recheck in 3 weeks.

## 2021-10-16 NOTE — Progress Notes (Addendum)
Pt continues to have difficulty with instructions. Last apt she was advised to continue 1 tablet daily except take 1/2 tablet on Thursdays. She had not followed the instructions from the time before the last apt. Pt reports she does not remember being told that dosing schedule at each visit. Pt is in range today and reported she has been taking 1 tablet daily and not taking 1/2 tablet on Thursdays as directed at last apt. Since pt is range will update calendar and advised to continue 1 tablet daily. Will see her back in 3 weeks instead of 4 for closer f/u.   Continue 1 tablet daily . Recheck in 3 weeks.

## 2021-10-19 ENCOUNTER — Telehealth: Payer: Self-pay | Admitting: Family Medicine

## 2021-10-19 NOTE — Telephone Encounter (Signed)
Please call pt and schedule f/u appt with me   I want to f/u for her last visit and ER visit and also review her neurology visit from june

## 2021-10-20 NOTE — Telephone Encounter (Signed)
Left vm for patient to call us back to scheduled an appt  w/ Dr. Glori Bickers  for Ed follow up

## 2021-10-22 NOTE — Telephone Encounter (Signed)
Patient called back and scheduled an appointment 11/06/21 at 8:30 am.

## 2021-10-24 ENCOUNTER — Ambulatory Visit (INDEPENDENT_AMBULATORY_CARE_PROVIDER_SITE_OTHER): Payer: Medicare Other

## 2021-10-24 DIAGNOSIS — I4430 Unspecified atrioventricular block: Secondary | ICD-10-CM

## 2021-10-24 LAB — CUP PACEART REMOTE DEVICE CHECK
Battery Remaining Longevity: 112 mo
Battery Voltage: 3.02 V
Brady Statistic AP VP Percent: 0 %
Brady Statistic AP VS Percent: 0 %
Brady Statistic AS VP Percent: 97.67 %
Brady Statistic AS VS Percent: 2.33 %
Brady Statistic RA Percent Paced: 0 %
Brady Statistic RV Percent Paced: 97.67 %
Date Time Interrogation Session: 20230803231733
Implantable Lead Implant Date: 20210507
Implantable Lead Location: 753860
Implantable Lead Model: 5076
Implantable Pulse Generator Implant Date: 20210507
Lead Channel Impedance Value: 3306 Ohm
Lead Channel Impedance Value: 3306 Ohm
Lead Channel Impedance Value: 399 Ohm
Lead Channel Impedance Value: 456 Ohm
Lead Channel Pacing Threshold Amplitude: 0.625 V
Lead Channel Pacing Threshold Pulse Width: 0.4 ms
Lead Channel Sensing Intrinsic Amplitude: 1.875 mV
Lead Channel Sensing Intrinsic Amplitude: 11.625 mV
Lead Channel Sensing Intrinsic Amplitude: 11.625 mV
Lead Channel Setting Pacing Amplitude: 2.5 V
Lead Channel Setting Pacing Pulse Width: 0.4 ms
Lead Channel Setting Sensing Sensitivity: 4 mV

## 2021-10-30 DIAGNOSIS — L821 Other seborrheic keratosis: Secondary | ICD-10-CM | POA: Diagnosis not present

## 2021-10-30 DIAGNOSIS — D23122 Other benign neoplasm of skin of left lower eyelid, including canthus: Secondary | ICD-10-CM | POA: Diagnosis not present

## 2021-10-30 DIAGNOSIS — D23112 Other benign neoplasm of skin of right lower eyelid, including canthus: Secondary | ICD-10-CM | POA: Diagnosis not present

## 2021-11-06 ENCOUNTER — Encounter: Payer: Self-pay | Admitting: Family Medicine

## 2021-11-06 ENCOUNTER — Ambulatory Visit (INDEPENDENT_AMBULATORY_CARE_PROVIDER_SITE_OTHER): Payer: Medicare Other

## 2021-11-06 ENCOUNTER — Ambulatory Visit (INDEPENDENT_AMBULATORY_CARE_PROVIDER_SITE_OTHER): Payer: Medicare Other | Admitting: Family Medicine

## 2021-11-06 VITALS — BP 118/68 | HR 76 | Temp 97.9°F | Ht 64.0 in | Wt 169.2 lb

## 2021-11-06 DIAGNOSIS — R0789 Other chest pain: Secondary | ICD-10-CM

## 2021-11-06 DIAGNOSIS — Z7901 Long term (current) use of anticoagulants: Secondary | ICD-10-CM | POA: Diagnosis not present

## 2021-11-06 DIAGNOSIS — F418 Other specified anxiety disorders: Secondary | ICD-10-CM

## 2021-11-06 DIAGNOSIS — I482 Chronic atrial fibrillation, unspecified: Secondary | ICD-10-CM

## 2021-11-06 DIAGNOSIS — E039 Hypothyroidism, unspecified: Secondary | ICD-10-CM | POA: Diagnosis not present

## 2021-11-06 DIAGNOSIS — R202 Paresthesia of skin: Secondary | ICD-10-CM | POA: Diagnosis not present

## 2021-11-06 LAB — POCT INR: INR: 2.9 (ref 2.0–3.0)

## 2021-11-06 NOTE — Patient Instructions (Addendum)
Discuss the a fib with Dr Caryl Comes- ? What options do you have for anticoagulation    Take care of yourself   Socialize more - this is good for your brain  Keep busy   Let us know if you want to consider an antidepressant medicine

## 2021-11-06 NOTE — Patient Instructions (Addendum)
Pre visit review using our clinic review tool, if applicable. No additional management support is needed unless otherwise documented below in the visit note.  Continue 1 tablet daily . Recheck in 4 weeks.  

## 2021-11-06 NOTE — Assessment & Plan Note (Signed)
With grief after loosing son  Has seen a counselor  Does not want medication but may consider if this worsens Reviewed stressors/ coping techniques/symptoms/ support sources/ tx options and side effects in detail today Good coping techniques  Unsure if her fear of mechanical/electric devices is related  Strongly encouraged good self care

## 2021-11-06 NOTE — Assessment & Plan Note (Addendum)
Resolved  Had reassuring ER visit Reviewed hospital records, lab results and studies in detail  Pt thinks it was actually anxiety /panic attack

## 2021-11-06 NOTE — Assessment & Plan Note (Signed)
Hypothyroidism  Pt has no clinical changes No change in energy level/ hair or skin/ edema and no tremor Lab Results  Component Value Date   TSH 2.436 10/08/2021    Levothyroxine 75 mcg daily

## 2021-11-06 NOTE — Progress Notes (Signed)
Continue 1 tablet daily. Recheck in 4 weeks.   

## 2021-11-06 NOTE — Assessment & Plan Note (Signed)
Right side now  More sensitivity than pain  May be trigeminal neuralgia that worsens with stress   Given handout re: trigeminal neuralgia to review  Also one for gabapentin as potential treatment Currently symptoms are not bad enough for medication

## 2021-11-06 NOTE — Assessment & Plan Note (Addendum)
Taking warfarin  Therapeutic INR today  Dosing /compliance have been more difficult  Per pt eliquis in the past caused hair loss and bleeding hemorrhoids She is not interested in other DOAC currently but will discuss with cardiology/Dr Caryl Comes at next visit  She is curious about asa as an option, but I do not think this is an option  No more palpitations  Last ER visit was reassuring  Reviewed hospital records, lab results and studies in detail

## 2021-11-06 NOTE — Progress Notes (Signed)
Subjective:    Patient ID: [name], female    DOB: 1933/03/04, 88 y.o.   MRN: [id number]  HPI Pt presents for f/u of a fib and chronic health problems   Wt Readings from Last 3 Encounters:  11/06/21 169 lb 3.2 oz (76.7 kg)  10/08/21 166 lb 8 oz (75.5 kg)  10/08/21 166 lb 8 oz (75.5 kg)   29.04 kg/m  Some facial/nerve pain on R side (had told the neurologist)  Thinks it is from a vein in face  Pain shoots up into her jaw and temporal area Has addressed with the oph (is not temporal arteritis)  It is hard on her   Too much going on    Taking warfarin for anticoagulation   Was seen in ER on 09/2021 for palpitations and anxiety /sense of doom Her INR was low  EKG was stable with paced rhythm   Thinks it was a panic attack  Fighting depression (with grief)  Does not want medication  Really has to push herself to get up and go  Is hard day to day   Thinks her memory is improving but she looses a word now and then  Doing much better remembering medicines and time of day   She started reading out loud to herself and thinks this is helping her memory   Socialization  Goes to church Took a trip to [place] on the phone  Very physically active (working outdoors) - has to take more breaks and feels worn out quicker   Tried to see a counselor  That person left    Lab Results  Component Value Date   INR 2.9 11/06/2021   INR 2.6 10/16/2021   INR 1.5 (H) 10/08/2021   In the past eliquis caused hair loss    Last few visits noted some issues with compliance -noted she did not remember the instructions from the time before   Saw neuro for cognitive changes in late June  Mild cogntive issues since stroke in 2021  She had concerns about effects of electromagnetic fields  It was recommended she consider a cognitive program in locat OT or ST clinic  EEG was scheduled - she declined  Disc grief/depression and need for counseling  Follow up planned in oct  She  disliked him and thought he was rude in general     BP Readings from Last 3 Encounters:  11/06/21 118/68  10/08/21 (!) 159/67  10/08/21 124/76   Pulse Readings from Last 3 Encounters:  11/06/21 76  10/08/21 70  10/08/21 72     Hypothyroid Lab Results  Component Value Date   TSH 2.436 10/08/2021   Patient Active Problem List   Diagnosis Date Noted   Chest pressure 10/08/2021   Other fatigue 10/08/2021   Brain fog 01/14/2021   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/20/2020   Post-menopausal bleeding 11/20/2020   Facial paresthesia 10/14/2020   Grief reaction 09/20/2020   Hearing loss 06/13/2020   Atrial fibrillation ([place]) 04/24/2020   Routine general medical examination at a health care facility 03/11/2020   Elevated glucose 03/03/2020   History of TIA (transient ischemic attack) 02/18/2020   S/P AV nodal ablation 07/28/19 07/29/2019   S/P placement of cardiac pacemaker MDT 07/28/19 07/29/2019   AV block 07/28/2019   History of CVA (cerebrovascular accident) 06/16/2019   Facial tingling 11/29/2018   Tremor of left hand 11/29/2018   Medicare annual wellness visit, subsequent 11/24/2018   Dysuria 02/06/2018   Dizzy  02/04/2018   Iron deficiency anemia 10/21/2017   Rapid atrial fibrillation ([place]) 08/19/2017   Constipation 08/02/2017   Unilateral primary osteoarthritis, left hip 05/04/2017   Status post total replacement of left hip 05/04/2017   Hip osteoarthritis 04/27/2017   Long term (current) use of anticoagulants 03/04/2017   Venous stasis dermatitis of both lower extremities 01/08/2017   Osteopenia 10/25/2016   Pedal edema 08/26/2016   Varicose veins of both lower extremities 08/26/2016   Estrogen deficiency 08/26/2016   Screening mammogram, encounter for 08/26/2016   Hemorrhoids 08/26/2016   History of nonmelanoma skin cancer 01/01/2016   Pruritus 10/04/2015   Urticaria 08/22/2014   Hip pain 08/02/2014   Left knee pain 08/02/2014   Chronic cough 05/08/2014    Hematochezia 04/09/2014   Colon cancer screening 08/16/2013   Encounter for therapeutic drug monitoring 04/20/2013   Left ovarian cyst 03/14/2013   COLONIC POLYPS, ADENOMATOUS, HX OF 09/18/2009   PULMONARY NODULE 12/20/2008   GANGLION CYST 10/04/2007   Mixed incontinence 04/28/2007   Hyperlipidemia 04/27/2007   Depression with anxiety 04/27/2007   Asthma, mild intermittent 04/27/2007   INSOMNIA 04/27/2007   ADENOMATOUS COLONIC POLYP 11/04/2006   Hypothyroidism 09/02/2006   Atrial fibrillation, chronic ([place]) 08/05/2006   Past Medical History:  Diagnosis Date   Allergic rhinitis    Alopecia 2/2 beta blockers    Anemia    Arthritis    Atrial fibrillation -persistent cardiologist-  dr klein/  primary EP -- dr Tawanna Sat ([facility])   a. s/p PVI [name] 2010;  b. on tikosyn/coumadin;  c. 05/2009 Echo: EF 60-65%, Gr 2 DD. (first dx 09/ 2007)   Bilateral lower extremity edema    Bleeding hemorrhoid    Carotid stenosis    mild (hosp 3/11)- consult by vasc/ Dr Donnetta Hutching   Complication of anesthesia    hard to wake   Diverticulosis of colon    Dyspnea    on exertion-climbing stairs   Fatty liver    H/O cardiac radiofrequency ablation    01/ 2008 at [facility] /  03/ 2010  at [facility] failure with preserved ejection fraction [facility])    History of adenomatous polyp of colon    tubular adenoma's   History of cardiomyopathy    secondary tachycardia-induced cardiomyopathy -- resolved 2014   History of squamous cell carcinoma in situ (SCCIS) of skin    05/ 2017  nasal bridge and right medial knee   History of transient ischemic attack (TIA)    01-24-2005 and 06-12-2009   Hyperlipidemia    Hypothyroidism    Mild intermittent asthma    reacts to cats   Mixed stress and urge urinary incontinence    Presence of permanent cardiac pacemaker    was put in 07/2019   Pulmonary nodule    S/P AV nodal ablation 07/28/19 07/29/2019   S/P mitral valve repair 10-23-1998  dr Boyce Medici at  [facility]    for MVP and regurg. (annuloplasty ring procedure)   S/P placement of cardiac pacemaker MDT 07/28/19 07/29/2019   Past Surgical History:  Procedure Laterality Date   APPENDECTOMY  1978   AV NODE ABLATION N/A 07/28/2019   Procedure: AV NODE ABLATION;  Surgeon: Deboraha Sprang, MD;  Location: [place] CV LAB;  Service: Cardiovascular;  Laterality: N/A;   BUBBLE STUDY  06/19/2019   Procedure: BUBBLE STUDY;  Surgeon: Pixie Casino, MD;  Location: [place];  Service: Cardiovascular;;   CARDIAC ELECTROPHYSIOLOGY [place]  01/ 2008    at [place]   right-sided ablation atrial flutter   CARDIAC ELECTROPHYSIOLOGY STUDY AND ABLATION  03/ 2010   dr Jaymes Graff at [place] node ablation and pulmonary vein isolation for atrial fib   CARDIOVERSION  06-18-2006;  07-13-2006;  10-19-2010;  10-27-2010   CATARACT EXTRACTION W/PHACO Right 04/09/2021   Procedure: CATARACT EXTRACTION PHACO AND INTRAOCULAR LENS PLACEMENT ([place]) RIGHT 6.71 01:11.1;  Surgeon: Leandrew Koyanagi, MD;  Location: [place];  Service: Ophthalmology;  Laterality: Right;   CATARACT EXTRACTION W/PHACO Left 04/23/2021   Procedure: CATARACT EXTRACTION PHACO AND INTRAOCULAR LENS PLACEMENT ([place]) LEFT;  Surgeon: Leandrew Koyanagi, MD;  Location: [place];  Service: Ophthalmology;  Laterality: Left;  [place] 5.65 00:50.1   COLONOSCOPY     COLONOSCOPY WITH PROPOFOL N/A 10/13/2017   Procedure: COLONOSCOPY WITH PROPOFOL;  Surgeon: [name], MD;  Location: [facility] ENDOSCOPY;  Service: Gastroenterology;  Laterality: N/A;   COLONOSCOPY WITH PROPOFOL N/A 02/20/2020   Procedure: COLONOSCOPY WITH PROPOFOL;  Surgeon: Lesly Rubenstein, MD;  Location: [facility] ENDOSCOPY;  Service: Endoscopy;  Laterality: N/A;   CYSTO/ TRANSURETHRAL COLLAGEN INJECTION THERAPY  07-26-2007   dr Matilde Sprang   DILATION AND CURETTAGE OF UTERUS     ESOPHAGOGASTRODUODENOSCOPY (EGD) WITH PROPOFOL N/A 10/13/2017    Procedure: ESOPHAGOGASTRODUODENOSCOPY (EGD) WITH PROPOFOL;  Surgeon: [name], MD;  Location[facility] ENDOSCOPY;  Service: Gastroenterology;  Laterality: N/A;   EVALUATION UNDER ANESTHESIA WITH HEMORRHOIDECTOMY N/A 04/09/2020   Procedure: EXAM UNDER ANESTHESIA WITH HEMORRHOIDECTOMY;  Surgeon: Jules Husbands, MD;  Location: [facility] ORS;  Service: General;  Laterality: N/A;   EXCISIONAL HEMORRHOIDECTOMY  1980s   GIVENS CAPSULE STUDY N/A 12/08/2017   Procedure: GIVENS CAPSULE STUDY;  Surgeon: [name], MD;  Location: [facility]COPY;  Service: Gastroenterology;  Laterality: N/A;   HEMORRHOID SURGERY N/A 10/29/2016   Procedure: HEMORRHOIDECTOMY;  Surgeon: Leighton Ruff, MD;  [facility];  Service: General;  Laterality: N/A;   MITRAL VALVE ANNULOPLASTY  10/23/1998   "Model 4625; [employer] [id number]"; [facility] 31m[facility]  PACEMAKER IMPLANT N/A 07/28/2019   Procedure: PACEMAKER IMPLANT;  Surgeon: KDeboraha Sprang MD;  [place] [facility];  Service: Cardiovascular;  Laterality: N/A;   [facility]  [facility]  TEE WITHOUT CARDIOVERSION N/A 06/19/2019   Procedure: TRANSESOPHAGEAL ECHOCARDIOGRAM (TEE);  Surgeon: HPixie Casino MD;  [facility];  Service: Cardiovascular;  Laterality: N/A;   TOTAL HIP ARTHROPLASTY Left 05/04/2017   Procedure: LEFT TOTAL HIP ARTHROPLASTY ANTERIOR APPROACH;  Surgeon: BMcarthur Rossetti MD;  [place]  Service: Orthopedics;  Laterality: Left;   TRANSTHORACIC ECHOCARDIOGRAM  05-01-2015   dr kCaryl Comes  ef 50-55%/  mild AV sclerosis without stenosis/  post MV repair with mild central MR (valve area by pressure half-time 2cm^2,  valve area by continutity equation 0.91cm^2, peak grandiant 88mg)/  severe LAE/ mild TR/ mild RAE    TUBAL LIGATION Bilateral 1978   Social History   Tobacco Use   Smoking status: Never   Smokeless tobacco: Never   Vaping Use   Vaping Use: Never used  Substance Use Topics   Alcohol use: Not Currently    Comment: seldom   Drug use: No   Family History  Problem Relation Age of Onset   Lung cancer Father        smoker, died at 8071  Alcohol abuse Father    Cancer Father        bladder and lung CA smoker   Sudden death Other    Breast cancer Neg Hx    Stroke Neg Hx    Allergies  Allergen Reactions   Amiodarone Swelling    SWELLING REACTION UNSPECIFIED    Penicillins Hives and Rash    Has patient had a PCN reaction causing immediate rash, facial/tongue/throat swelling, SOB or lightheadedness with hypotension: No Has patient had a PCN reaction causing severe rash involving mucus membranes or skin necrosis: No Has patient had a PCN reaction that required hospitalization:Patient was inpatient when reaction occurred Has patient had a PCN reaction occurring within the last 10 years: No If all of the above answers are "NO", then may proceed with Cephalosporin use   Amiodarone Hcl Swelling    SWELLING REACTION UNSPECIFIED    Statins Rash    REACTION: rash   Current Outpatient Medications on File Prior to Visit  Medication Sig Dispense Refill   acetaminophen (TYLENOL) 500 MG tablet Take 500 mg by mouth every 6 (six) hours as needed for moderate pain.     albuterol (VENTOLIN HFA) 108 (90 Base) MCG/ACT inhaler Inhale into the lungs every 6 (six) hours as needed for wheezing or shortness of breath.     alendronate (FOSAMAX) 70 MG tablet TAKE 1 TABLET EVERY 7 DAYS. TAKE WITH A FULL GLASS OF WATER ON AN EMPTY STOMACH. 12 tablet 0   budesonide (RHINOCORT AQUA) 32 MCG/ACT nasal spray Place into both nostrils daily.     diphenhydrAMINE-zinc acetate (BENADRYL) cream Apply 1 application topically 3 (three) times daily as needed for itching.     ferrous sulfate 325 (65 FE) MG EC tablet TAKE 1 TABLET EVERY DAY WITH BREAKFAST 90 tablet 1   furosemide (LASIX) 40 MG tablet Take 40 mg by mouth daily as needed for  edema.     hydrOXYzine (ATARAX/VISTARIL) 25 MG tablet Take 1 tablet (25 mg total) by mouth every 6 (six) hours as needed for itching. 30 tablet 0   levothyroxine (SYNTHROID) 75 MCG tablet Take 1 tablet (75 mcg total) by mouth daily before breakfast. 90 tablet 1   Polyethyl Glycol-Propyl Glycol 0.4-0.3 % SOLN Place 1-2 drops into both eyes 3 (three) times daily as needed (for dry eyes.).     warfarin (COUMADIN) 5 MG tablet TAKE 1 TABLET BY MOUTH DAILY EXCEPT TAKE 1 AND 1/2 TABLETS ON WEDNESDAYS OR AS DIRECTED BY ANTICOAGULATION CLINIC 100 tablet 1   No current facility-administered medications on file prior to visit.     Review of Systems  Constitutional:  Negative for activity change, appetite change, fatigue, fever and unexpected weight change.  HENT:  Negative for congestion, ear pain, rhinorrhea, sinus pressure and sore throat.   Eyes:  Negative for pain, redness and visual disturbance.  Respiratory:  Negative for cough, shortness of breath and wheezing.   Cardiovascular:  Negative for chest pain and palpitations.  Gastrointestinal:  Negative for abdominal pain, blood in stool, constipation and diarrhea.  Endocrine: Negative for polydipsia and polyuria.  Genitourinary:  Negative for dysuria, frequency and urgency.  Musculoskeletal:  Negative for arthralgias, back pain and myalgias.  Skin:  Negative for pallor and rash.  Allergic/Immunologic: Negative for environmental allergies.  Neurological:  Positive for dizziness and numbness. Negative for syncope, speech difficulty and headaches.       Tingling on R side of face  At times feels dizzy/weak   Hematological:  Negative  for adenopathy. Does not bruise/bleed easily.  Psychiatric/Behavioral:  Positive for decreased concentration and dysphoric mood. The patient is nervous/anxious.        Objective:   Physical Exam Constitutional:      General: She is not in acute distress.    Appearance: Normal appearance. She is well-developed. She  is not ill-appearing or diaphoretic.     Comments: overwt  HENT:     Head: Normocephalic and atraumatic.     Comments: No facial droop No facial swelling     Right Ear: External ear normal.     Left Ear: External ear normal.     Nose: Nose normal.     Mouth/Throat:     Mouth: Mucous membranes are moist.  Eyes:     General: No scleral icterus.       Right eye: No discharge.        Left eye: No discharge.     Conjunctiva/sclera: Conjunctivae normal.     Pupils: Pupils are equal, round, and reactive to light.  Neck:     Thyroid: No thyromegaly.     Vascular: No carotid bruit or JVD.  Cardiovascular:     Rate and Rhythm: Normal rate and regular rhythm.     Heart sounds: Normal heart sounds.     No gallop.  Pulmonary:     Effort: Pulmonary effort is normal. No respiratory distress.     Breath sounds: Normal breath sounds. No stridor. No wheezing, rhonchi or rales.  Abdominal:     General: There is no distension or abdominal bruit.     Palpations: Abdomen is soft.  Musculoskeletal:     Cervical back: Normal range of motion and neck supple.     Right lower leg: No edema.     Left lower leg: No edema.  Lymphadenopathy:     Cervical: No cervical adenopathy.  Skin:    General: Skin is warm and dry.     Coloration: Skin is not pale.     Findings: No bruising or rash.  Neurological:     Mental Status: She is alert.     Cranial Nerves: No cranial nerve deficit.     Motor: No weakness.     Coordination: Coordination normal.     Deep Tendon Reflexes: Reflexes are normal and symmetric. Reflexes normal.     Comments: No focal neuro deficits   Psychiatric:        Attention and Perception: Attention normal.        Mood and Affect: Mood normal.        Speech: Speech normal.        Behavior: Behavior normal.        Thought Content: Thought content normal.     Comments: Pt is fairly sharp today  Candidly discusses symptoms and stressors               Assessment & Plan:    Problem List Items Addressed This Visit       Cardiovascular and Mediastinum   Atrial fibrillation, chronic (HCC) - Primary    Taking warfarin  Therapeutic INR today  Dosing /compliance have been more difficult  Per pt eliquis in the past caused hair loss and bleeding hemorrhoids She is not interested in other DOAC currently but will discuss with cardiology/Dr Caryl Comes at next visit  She is curious about asa as an option, but I do not think this is an option  No more palpitations  Last ER visit was reassuring  Reviewed hospital records, lab results and studies in detail          Endocrine   Hypothyroidism    Hypothyroidism  Pt has no clinical changes No change in energy level/ hair or skin/ edema and no tremor Lab Results  Component Value Date   TSH 2.436 10/08/2021    Levothyroxine 75 mcg daily         Other   Chest pressure    Resolved  Had reassuring ER visit Reviewed hospital records, lab results and studies in detail  Pt thinks it was actually anxiety /panic attack      Depression with anxiety    With grief after loosing son  Has seen a counselor  Does not want medication but may consider if this worsens Reviewed stressors/ coping techniques/symptoms/ support sources/ tx options and side effects in detail today Good coping techniques  Unsure if her fear of mechanical/electric devices is related  Strongly encouraged good self care       Facial tingling    Right side now  More sensitivity than pain  May be trigeminal neuralgia that worsens with stress   Given handout re: trigeminal neuralgia to review  Also one for gabapentin as potential treatment Currently symptoms are not bad enough for medication

## 2021-11-07 ENCOUNTER — Telehealth: Payer: Self-pay | Admitting: Family Medicine

## 2021-11-07 NOTE — Telephone Encounter (Signed)
Patient called and stated if valium would be a help with the nerves in her face. Call back number [phone].

## 2021-11-09 NOTE — Telephone Encounter (Signed)
That is not a medicine we use for facial pain and it can cause falls, sedation, etc If she wants to try gabapentin (we discussed) let me know If symptoms have worsened at all please let me know

## 2021-11-10 NOTE — Telephone Encounter (Signed)
I don't see where any one called this patient , no phone note

## 2021-11-10 NOTE — Telephone Encounter (Signed)
[place] Night - Client Nonclinical Telephone Record  [facility] Client [place] Night - Client Client Site [place] - Night Contact Type Call Who Is Calling Patient / Member / Family / Caregiver Caller Name [place] Phone Number [phone] Call Type Message Only Information Provided Reason for Call Returning a Call from the Office Initial Bruce says she is returning a call to the doctor.... Dr. Glori Bickers Disp. Time Disposition Final User 11/08/2021 10:35:36 AM General Information Provided Yes Bettye Boeck Call Closed By: Bettye Boeck Transaction Date/Time: 11/08/2021 10:34:04 AM (ET

## 2021-11-13 NOTE — Progress Notes (Signed)
Remote pacemaker transmission.   

## 2021-11-14 ENCOUNTER — Other Ambulatory Visit: Payer: Self-pay | Admitting: Family Medicine

## 2021-11-14 DIAGNOSIS — Z7901 Long term (current) use of anticoagulants: Secondary | ICD-10-CM

## 2021-11-18 DIAGNOSIS — I872 Venous insufficiency (chronic) (peripheral): Secondary | ICD-10-CM | POA: Diagnosis not present

## 2021-11-18 DIAGNOSIS — R600 Localized edema: Secondary | ICD-10-CM | POA: Diagnosis not present

## 2021-11-21 ENCOUNTER — Telehealth: Payer: Self-pay | Admitting: Family Medicine

## 2021-11-21 NOTE — Telephone Encounter (Signed)
Patient called in and stated she will be out of town on September 14th and need to be rescheduled for her Coumadin. She stated she will not be able to do the 20th or 26th either. Thank you!

## 2021-11-26 NOTE — Telephone Encounter (Signed)
Last INR in range and pt was to recheck INR in 4 weeks.   Pt is going out of town on 9/14 so will not be able tot make her apt for coumadin clinic. Pt requested to go to another coumadin clinic. Pt has been RS for 9/12 at [facility]. Gave pt address. Pt verbalized understanding.

## 2021-11-27 ENCOUNTER — Telehealth: Payer: Self-pay | Admitting: Family Medicine

## 2021-11-27 NOTE — Telephone Encounter (Signed)
Pt called in wants Dr. Glori Bickers to know she been having black stool for a while and is concern would like to know next steps if she need an appointment . Please Advise [phone]

## 2021-11-27 NOTE — Telephone Encounter (Signed)
Please schedule an appt asap (could be blood in stool)

## 2021-11-27 NOTE — Telephone Encounter (Signed)
LVM for patient to give [name] a call and schedule.

## 2021-12-02 ENCOUNTER — Ambulatory Visit (INDEPENDENT_AMBULATORY_CARE_PROVIDER_SITE_OTHER): Payer: Medicare Other

## 2021-12-02 DIAGNOSIS — Z7901 Long term (current) use of anticoagulants: Secondary | ICD-10-CM | POA: Diagnosis not present

## 2021-12-02 LAB — POCT INR: INR: 2.1 (ref 2.0–3.0)

## 2021-12-02 NOTE — Progress Notes (Signed)
Continue 1 tablet daily. Recheck in 4 weeks.   

## 2021-12-02 NOTE — Patient Instructions (Addendum)
Pre visit review using our clinic review tool, if applicable. No additional management support is needed unless otherwise documented below in the visit note.  Continue 1 tablet daily . Recheck in 4 weeks.  

## 2021-12-04 ENCOUNTER — Ambulatory Visit: Payer: Medicare Other

## 2021-12-08 ENCOUNTER — Telehealth: Payer: Self-pay

## 2021-12-08 ENCOUNTER — Telehealth: Payer: Self-pay | Admitting: *Deleted

## 2021-12-08 NOTE — Telephone Encounter (Signed)
Aware Iron will turn stool black  Will see her then and agree with ER precautions

## 2021-12-08 NOTE — Progress Notes (Signed)
Chronic Care Management Pharmacy Assistant   Name: [name]  MRN: [id number] DOB: 04/14/1932  Reason for Encounter: CCM (General Adherence)  Recent office visits:  11/06/21 [name] Pardon, MD Atrial fibrillation No med changes   Recent consult visits:  None since last CCM contact  Hospital visits:  None since last CCM contact  Medications: Outpatient Encounter Medications as of 12/08/2021  Medication Sig   acetaminophen (TYLENOL) 500 MG tablet Take 500 mg by mouth every 6 (six) hours as needed for moderate pain.   albuterol (VENTOLIN HFA) 108 (90 Base) MCG/ACT inhaler Inhale into the lungs every 6 (six) hours as needed for wheezing or shortness of breath.   alendronate (FOSAMAX) 70 MG tablet TAKE 1 TABLET EVERY 7 DAYS. TAKE WITH A FULL GLASS OF WATER ON AN EMPTY STOMACH.   budesonide (RHINOCORT AQUA) 32 MCG/ACT nasal spray Place into both nostrils daily.   diphenhydrAMINE-zinc acetate (BENADRYL) cream Apply 1 application topically 3 (three) times daily as needed for itching.   ferrous sulfate 325 (65 FE) MG EC tablet TAKE 1 TABLET EVERY DAY WITH BREAKFAST   furosemide (LASIX) 40 MG tablet Take 40 mg by mouth daily as needed for edema.   hydrOXYzine (ATARAX/VISTARIL) 25 MG tablet Take 1 tablet (25 mg total) by mouth every 6 (six) hours as needed for itching.   levothyroxine (SYNTHROID) 75 MCG tablet Take 1 tablet (75 mcg total) by mouth daily before breakfast.   Polyethyl Glycol-Propyl Glycol 0.4-0.3 % SOLN Place 1-2 drops into both eyes 3 (three) times daily as needed (for dry eyes.).   warfarin (COUMADIN) 5 MG tablet TAKE 1 TABLET DAILY EXCEPT TAKE 1 AND 1/2 TABLETS ON WEDNESDAYS OR AS DIRECTED BY COUMADIN CLINIC   No facility-administered encounter medications on file as of 12/08/2021.    [place] on 12/08/2021 for general disease state and medication adherence call.   Star Medications: Medication Name/mg Last Fill Days Supply No star rating drugs  What  concerns do you have about your medications? No concerns   The patient denies side effects with their medications.   How often do you forget or accidentally miss a dose? Never  Do you use a pillbox? No  Are you having any problems getting your medications from your pharmacy? No  Has the cost of your medications been a concern? No  Since last visit with CPP, no interventions have been made.   The patient has had an ED visit since last contact.   The patient states the following problems with their health: Patient states she has been having black stools for a while and is wondering if it could be blood. It has been happening for two years. She is wondering if it could be internal bleeding. Patient called on 11/27/2021 and left a message; patient was called back same day, but did not answer. I advised patient I would send a message to triage and she should look for a call from them and to please answer when they call. Patient expressed understanding. Message was sent to [facility] Triage.   Patient denies concerns or questions for Charlene Brooke, PharmD at this time.   Care Gaps: Annual wellness visit in last year? Yes 03/10/2021 Most Recent BP reading: 118/68 on 11/06/2021  Summary of recommendations from last [place] visit (Date:05/27/2021)  Summary: CCM Initial visit -Pt reports compliance with medications as prescribed -Pt reports persistent nasal congestion and post-nasal drip leading to bothersome throat-clearing/cough. She is not taking cetirizine or nasal  spray on a regular basis -Pt reports significant grief/depression since losing her son last summer; she has also lost several other family members (daughter, husband) suddenly in the past and has not had counseling for this. She is open to speaking to a counselor, preferably a Marketing executive -Pt reports significant salt cravings that she read can be a sign of Addison's disease; she want to be tested for Addison's/hormone  imbalances. We discussed rarity of Addisons and low odds of developing this late in life. Also discussed more likely causes of salt cravings - dehydration, hyponatremia. She reports she has made an effort to increase fluid intake and is drinking 90 oz water per day - if she is overhydrating this could lead to hypervolemic hyponatremia.   Recommendations/Changes made from today's visit: -Advised to use Flonase 1-2 times daily until sx improve; if no improvement contact us -Referred to CCM LCSW for counseling -Referring to PCP re: Addisons/hormone testing and check Na   Plan: -[place] will call patient 6 months -Pharmacist follow up televisit scheduled for 1 year   Upcoming appointments: CCM appointment on 05/27/2022  Charlene Brooke, CPP notified  Marijean Niemann, [place] Assistant [phone]

## 2021-12-08 NOTE — Telephone Encounter (Signed)
-----   Message from Marijean Niemann, [place] sent at 12/08/2021  1:45 PM EDT ----- Regarding: Black Stool Patient states she has been having black stools for a while and is wondering if it could be blood. It has been happening for two years. She is wondering if it could be internal bleeding. Patient called on 11/27/2021 and left a message; patient was called back same day, but did not answer and did not call back. I advised patient I would send a message to triage and she should look for a call from them and to please answer when they call. Patient expressed understanding.   Marijean Niemann, [place] Pharmacy Assistant [phone]

## 2021-12-08 NOTE — Telephone Encounter (Signed)
Spoke to patient by telephone and was advised that this is nothing new. Patient stated that she has extreme weakness and this has been going on for a while. Patient stated that she has a pacemaker and has for several years. Patient stated a few months after getting her pacemaker she had a magnetic shock caused by her stove. Patient stated that her cardiologist is aware of this. Patient stated that she feels that something is going on because her stools are black but she does take iron. Patient stated that she does not know if something may be going in internally and wants to see if there are any test that she may need to have done. Patient denies any abdominal pain, rectal bleeding or blood in the toilet. Patient scheduled for an appointment with Dr. Glori Bickers 12/11/21 at 8:30 am. Patient was given ER precautions and she verbalized understanding.

## 2021-12-10 DIAGNOSIS — I872 Venous insufficiency (chronic) (peripheral): Secondary | ICD-10-CM | POA: Diagnosis not present

## 2021-12-11 ENCOUNTER — Ambulatory Visit (INDEPENDENT_AMBULATORY_CARE_PROVIDER_SITE_OTHER): Payer: Medicare Other | Admitting: Family Medicine

## 2021-12-11 ENCOUNTER — Encounter: Payer: Self-pay | Admitting: Family Medicine

## 2021-12-11 VITALS — BP 142/78 | HR 81 | Temp 97.6°F | Ht 64.0 in | Wt 170.5 lb

## 2021-12-11 DIAGNOSIS — D500 Iron deficiency anemia secondary to blood loss (chronic): Secondary | ICD-10-CM | POA: Diagnosis not present

## 2021-12-11 DIAGNOSIS — R0982 Postnasal drip: Secondary | ICD-10-CM | POA: Diagnosis not present

## 2021-12-11 DIAGNOSIS — K921 Melena: Secondary | ICD-10-CM | POA: Diagnosis not present

## 2021-12-11 DIAGNOSIS — R5382 Chronic fatigue, unspecified: Secondary | ICD-10-CM

## 2021-12-11 NOTE — Assessment & Plan Note (Signed)
Recommend one of the less sedating antihistamines otc Reassuring exam Suspect from seaonal allergies

## 2021-12-11 NOTE — Addendum Note (Signed)
Addended by: Ellamae Sia on: 12/11/2021 03:17 PM   Modules accepted: Orders

## 2021-12-11 NOTE — Assessment & Plan Note (Signed)
Takes oral iron  Dark stools noted  Lab today with more fatigue  Will check stool heme cards after holding iron as well

## 2021-12-11 NOTE — Assessment & Plan Note (Signed)
Lab today  May be due to grief/depression as well

## 2021-12-11 NOTE — Progress Notes (Signed)
Subjective:    Patient ID: [name], female    DOB: 1932/10/21, 88 y.o.   MRN: [id number]  HPI Pt presents with c/o dark stools and ongoing weakness   Wt Readings from Last 3 Encounters:  12/11/21 170 lb 8 oz (77.3 kg)  11/06/21 169 lb 3.2 oz (76.7 kg)  10/08/21 166 lb 8 oz (75.5 kg)   29.27 kg/m  Going on for a while  Has had very sticky black stools   Much more frequent stools- multiple times per day  Small stools and very soft / it is hard to clean toilet  Comes and goes  No cramping or gas or bloating    Ever since she had an upper GI (5 years ago)  Was worried perhaps this caused bleeding   Has taken iron all along   No hemorrhoid trouble   No abdominal pain   Has a lot of post nasal drip  Has taken benadryl in the past     Still has extreme tiredness- has to push to do anything  Cannot be as active-sleeps a lot    Colonoscopy  01/2020 multiple polyps  3 y recall recommended   2019 - had EGD-fairly normal  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    Takes warfarin for a fib  In past eliquis caused hemorrhoidal bleeding and hair loss   Lab Results  Component Value Date   INR 2.1 12/02/2021   INR 2.9 11/06/2021   INR 2.6 10/16/2021    Lab Results  Component Value Date   VITAMINB12 448 11/06/2020     Lab Results  Component Value Date   WBC 4.7 10/08/2021   HGB 13.1 10/08/2021   HCT 41.2 10/08/2021   MCV 89.6 10/08/2021   PLT 124 (L) 10/08/2021   Lab Results  Component Value Date   IRON 44 12/20/2020   TIBC 350 12/28/2018   FERRITIN 50.6 12/20/2020    H/o iron def / from hemorrhoidal bleeding in the past   Takes ferrous sulfate 325 mg daily   Hypothyroid  Lab Results  Component Value Date   TSH 2.436 10/08/2021   Patient Active Problem List   Diagnosis Date Noted   Black stools 12/11/2021   PND (post-nasal drip) 12/11/2021   Chest pressure 10/08/2021   Other fatigue 10/08/2021   Brain fog 01/14/2021   Prolapse urethral mucosa  11/20/2020   Post-menopausal bleeding 11/20/2020   Facial paresthesia 10/14/2020   Grief reaction 09/20/2020   Hearing loss 06/13/2020   Atrial fibrillation ([place]) 04/24/2020   Routine general medical examination at a health care facility 03/11/2020   Elevated glucose 03/03/2020   History of TIA (transient ischemic attack) 02/18/2020   S/P AV nodal ablation 07/28/19 07/29/2019   S/P placement of cardiac pacemaker MDT 07/28/19 07/29/2019   AV block 07/28/2019   History of CVA (cerebrovascular accident) 06/16/2019   Facial tingling 11/29/2018   Tremor of left hand 11/29/2018   Medicare annual wellness visit, subsequent 11/24/2018   Dysuria 02/06/2018   Dizzy 02/04/2018   Iron deficiency anemia 10/21/2017   Rapid atrial fibrillation ([place]) 08/19/2017   Constipation 08/02/2017   Unilateral primary osteoarthritis, left hip 05/04/2017   Status post total replacement of left hip 05/04/2017   Hip osteoarthritis 04/27/2017   Long term (current) use of anticoagulants 03/04/2017   Venous stasis dermatitis of both lower extremities 01/08/2017   Osteopenia 10/25/2016   Pedal edema 08/26/2016   Varicose veins of both lower extremities 08/26/2016  Estrogen deficiency 08/26/2016   Screening mammogram, encounter for 08/26/2016   Hemorrhoids 08/26/2016   History of nonmelanoma skin cancer 01/01/2016   Pruritus 10/04/2015   Urticaria 08/22/2014   Hip pain 08/02/2014   Left knee pain 08/02/2014   Chronic cough 05/08/2014   Hematochezia 04/09/2014   Colon cancer screening 08/16/2013   Fatigue 08/16/2013   Encounter for therapeutic drug monitoring 04/20/2013   Left ovarian cyst 03/14/2013   COLONIC POLYPS, ADENOMATOUS, HX OF 09/18/2009   PULMONARY NODULE 12/20/2008   GANGLION CYST 10/04/2007   Mixed incontinence 04/28/2007   Hyperlipidemia 04/27/2007   Depression with anxiety 04/27/2007   Asthma, mild intermittent 04/27/2007   INSOMNIA 04/27/2007   ADENOMATOUS COLONIC POLYP 11/04/2006    Hypothyroidism 09/02/2006   Atrial fibrillation, chronic ([place]) 08/05/2006   Past Medical History:  Diagnosis Date   Allergic rhinitis    Alopecia 2/2 beta blockers    Anemia    Arthritis    Atrial fibrillation -persistent cardiologist-  dr klein/  primary EP -- dr Tawanna Sat ([place])   a. s/p PVI [name] 2010;  b. on tikosyn/coumadin;  c. 05/2009 Echo: EF 60-65%, Gr 2 DD. (first dx 09/ 2007)   Bilateral lower extremity edema    Bleeding hemorrhoid    Carotid stenosis    mild (hosp 3/11)- consult by vasc/ Dr Donnetta Hutching   Complication of anesthesia    hard to wake   Diverticulosis of colon    Dyspnea    on exertion-climbing stairs   Fatty liver    H/O cardiac radiofrequency ablation    01/ 2008 at [facility] /  03/ 2010  at [facility] failure with preserved ejection fraction [facility])    History of adenomatous polyp of colon    tubular adenoma's   History of cardiomyopathy    secondary tachycardia-induced cardiomyopathy -- resolved 2014   History of squamous cell carcinoma in situ (SCCIS) of skin    05/ 2017  nasal bridge and right medial knee   History of transient ischemic attack (TIA)    01-24-2005 and 06-12-2009   Hyperlipidemia    Hypothyroidism    Mild intermittent asthma    reacts to cats   Mixed stress and urge urinary incontinence    Presence of permanent cardiac pacemaker    was put in 07/2019   Pulmonary nodule    S/P AV nodal ablation 07/28/19 07/29/2019   S/P mitral valve repair 10-23-1998  dr Boyce [facility]i at [facility]   for MVP and regurg. (annuloplasty ring procedure)   S/P placement of cardiac pacemaker MDT 07/28/19 07/29/2019   Past Surgical History:  Procedure Laterality Date   APPENDECTOMY  1978   AV NODE ABLATION N/A 07/28/2019   Procedure: AV NODE ABLATION;  Surgeon: Deboraha Sprang, MD;  Location: [place] [facility];  Service: Cardiovascular;  Laterality: N/A;   BUBBLE STUDY  06/19/2019   Procedure: BUBBLE STUDY;  Surgeon: Pixie Casino, MD;   Location: [facility] ENDOSCOPY;  Service: Cardiovascular;;   CARDIAC ELECTROPHYSIOLOGY [place]  01/ 2008    at [place]   right-sided ablation atrial flutter   [place]  03/ 2010   dr Jaymes Graff at [facility]   AV node ablation and pulmonary vein isolation for atrial fib   CARDIOVERSION  06-18-2006;  07-13-2006;  10-19-2010;  10-27-2010   CATARACT EXTRACTION W/PHACO Right 04/09/2021   Procedure: CATARACT EXTRACTION PHACO AND INTRAOCULAR LENS PLACEMENT (IOC) RIGHT 6.71 01:11.1;  Surgeon: Leandrew Koyanagi, MD;  Location: [place];  Service: Ophthalmology;  Laterality: Right;   CATARACT EXTRACTION W/PHACO Left 04/23/2021   Procedure: CATARACT EXTRACTION PHACO AND INTRAOCULAR LENS PLACEMENT ([place]) LEFT;  Surgeon: Leandrew Koyanagi, MD;  Location: [place];  Service: Ophthalmology;  Laterality: Left;  [place] 5.65 00:50.1   COLONOSCOPY     COLONOSCOPY WITH PROPOFOL N/A 10/13/2017   Procedure: COLONOSCOPY WITH PROPOFOL;  Surgeon: [name], MD;  Location: [facility] ENDOSCOPY;  Service: Gastroenterology;  Laterality: N/A;   COLONOSCOPY WITH PROPOFOL N/A 02/20/2020   Procedure: COLONOSCOPY WITH PROPOFOL;  Surgeon: Lesly Rubenstein, MD;  Location: [facility] ENDOSCOPY;  Service: Endoscopy;  Laterality: N/A;   CYSTO/ TRANSURETHRAL COLLAGEN INJECTION THERAPY  07-26-2007   dr Matilde Sprang   DILATION AND CURETTAGE OF UTERUS     ESOPHAGOGASTRODUODENOSCOPY (EGD) WITH PROPOFOL N/A 10/13/2017   Procedure: ESOPHAGOGASTRODUODENOSCOPY (EGD) WITH PROPOFOL;  Surgeon: [name], MD;  Location: [facility] ENDOSCOPY;  Service: Gastroenterology;  Laterality: N/A;   EVALUATION UNDER ANESTHESIA WITH HEMORRHOIDECTOMY N/A 04/09/2020   Procedure: EXAM UNDER ANESTHESIA WITH HEMORRHOIDECTOMY;  Surgeon: Jules Husbands, MD;  Location: [facility] ORS;  Service: General;  Laterality: N/A;   EXCISIONAL HEMORRHOIDECTOMY  1980s   GIVENS CAPSULE STUDY N/A 12/08/2017   Procedure:  GIVENS CAPSULE STUDY;  Surgeon: [name], MD;  Location: [facility] ENDOSCOPY;  Service: Gastroenterology;  Laterality: N/A;   HEMORRHOID SURGERY N/A 10/29/2016   Procedure: HEMORRHOIDECTOMY;  Surgeon: Leighton Ruff, MD;  [facility];  Service: General;  Laterality: N/A;   MITRAL VALVE ANNULOPLASTY  10/23/1998   "Model 4625; Campbell Lerner [id number]"; [facility] 68m[facility] CB[facility]  PACEMAKER IMPLANT N/A 07/28/2019   Procedure: PACEMAKER IMPLANT;  Surgeon: KDeboraha Sprang MD;  [place];  Service: Cardiovascular;  Laterality: N/A;   [place]  [facility]  [facility]  TEE WITHOUT CARDIOVERSION N/A 06/19/2019   Procedure: TRANSESOPHAGEAL ECHOCARDIOGRAM (TEE);  Surgeon: HPixie Casino MD;  [facility];  Service: Cardiovascular;  Laterality: N/A;   TOTAL HIP ARTHROPLASTY Left 05/04/2017   Procedure: LEFT TOTAL HIP ARTHROPLASTY ANTERIOR APPROACH;  Surgeon: BMcarthur Rossetti MD;  [place]  Service: Orthopedics;  Laterality: Left;   TRANSTHORACIC ECHOCARDIOGRAM  05-01-2015   dr kCaryl Comes  ef 50-55%/  mild AV sclerosis without stenosis/  post MV repair with mild central MR (valve area by pressure half-time 2cm^2,  valve area by continutity equation 0.91cm^2, peak grandiant 845mg)/  severe LAE/ mild TR/ mild RAE    TUBAL LIGATION Bilateral 1978   Social History   Tobacco Use   Smoking status: Never   Smokeless tobacco: Never  Vaping Use   Vaping Use: Never used  Substance Use Topics   Alcohol use: Not Currently    Comment: seldom   Drug use: No   Family History  Problem Relation Age of Onset   Lung cancer Father        smoker, died at 8064 Alcohol abuse Father    Cancer Father        bladder and lung CA smoker   Sudden death Other    Breast cancer Neg Hx    Stroke Neg Hx    Allergies  Allergen Reactions   Amiodarone Swelling    SWELLING REACTION UNSPECIFIED     Penicillins Hives and Rash    Has patient had a PCN reaction causing immediate rash,  facial/tongue/throat swelling, SOB or lightheadedness with hypotension: No Has patient had a PCN reaction causing severe rash involving mucus membranes or skin necrosis: No Has patient had a PCN reaction that required hospitalization:Patient was inpatient when reaction occurred Has patient had a PCN reaction occurring within the last 10 years: No If all of the above answers are "NO", then may proceed with Cephalosporin use   Amiodarone Hcl Swelling    SWELLING REACTION UNSPECIFIED    Statins Rash    REACTION: rash   Current Outpatient Medications on File Prior to Visit  Medication Sig Dispense Refill   acetaminophen (TYLENOL) 500 MG tablet Take 500 mg by mouth every 6 (six) hours as needed for moderate pain.     albuterol (VENTOLIN HFA) 108 (90 Base) MCG/ACT inhaler Inhale into the lungs every 6 (six) hours as needed for wheezing or shortness of breath.     alendronate (FOSAMAX) 70 MG tablet TAKE 1 TABLET EVERY 7 DAYS. TAKE WITH A FULL GLASS OF WATER ON AN EMPTY STOMACH. 12 tablet 0   budesonide (RHINOCORT AQUA) 32 MCG/ACT nasal spray Place into both nostrils daily.     diphenhydrAMINE-zinc acetate (BENADRYL) cream Apply 1 application topically 3 (three) times daily as needed for itching.     ferrous sulfate 325 (65 FE) MG EC tablet TAKE 1 TABLET EVERY DAY WITH BREAKFAST 90 tablet 1   furosemide (LASIX) 40 MG tablet Take 40 mg by mouth daily as needed for edema.     hydrOXYzine (ATARAX/VISTARIL) 25 MG tablet Take 1 tablet (25 mg total) by mouth every 6 (six) hours as needed for itching. 30 tablet 0   levothyroxine (SYNTHROID) 75 MCG tablet Take 1 tablet (75 mcg total) by mouth daily before breakfast. 90 tablet 1   Polyethyl Glycol-Propyl Glycol 0.4-0.3 % SOLN Place 1-2 drops into both eyes 3 (three) times daily as needed (for dry eyes.).     warfarin (COUMADIN) 5 MG tablet TAKE 1 TABLET DAILY EXCEPT TAKE 1  AND 1/2 TABLETS ON WEDNESDAYS OR AS DIRECTED BY COUMADIN CLINIC 100 tablet 1   No current facility-administered medications on file prior to visit.    Review of Systems  Constitutional:  Positive for fatigue. Negative for activity change, appetite change, fever and unexpected weight change.  HENT:  Positive for postnasal drip and rhinorrhea. Negative for congestion, ear pain, nosebleeds, sinus pressure, sinus pain, sneezing, sore throat, tinnitus and voice change.   Eyes:  Negative for pain, redness and visual disturbance.  Respiratory:  Negative for cough, shortness of breath and wheezing.   Cardiovascular:  Negative for chest pain and palpitations.  Gastrointestinal:  Negative for abdominal distention, abdominal pain, anal bleeding, blood in stool, constipation, diarrhea, nausea, rectal pain and vomiting.       Dark stools  Endocrine: Negative for polydipsia and polyuria.  Genitourinary:  Negative for dysuria, frequency and urgency.  Musculoskeletal:  Negative for arthralgias, back pain and myalgias.  Skin:  Negative for pallor and rash.  Allergic/Immunologic: Negative for environmental allergies.  Neurological:  Negative for dizziness, syncope and headaches.  Hematological:  Negative for adenopathy. Does not bruise/bleed easily.  Psychiatric/Behavioral:  Positive for dysphoric mood. Negative for decreased concentration. The patient is not nervous/anxious.        Objective:   Physical Exam Constitutional:      General: She is not in acute distress.    Appearance: Normal appearance. She is well-developed and normal weight. She is not ill-appearing or diaphoretic.  HENT:  Head: Normocephalic and atraumatic.     Right Ear: Tympanic membrane, ear canal and external ear normal.     Left Ear: Tympanic membrane, ear canal and external ear normal.     Ears:     Comments: Hearing aides    Nose:     Comments: Boggy nares Scant clear pnd    Mouth/Throat:     Mouth: Mucous membranes  are moist.     Pharynx: No oropharyngeal exudate or posterior oropharyngeal erythema.  Eyes:     General:        Right eye: No discharge.        Left eye: No discharge.     Conjunctiva/sclera: Conjunctivae normal.     Pupils: Pupils are equal, round, and reactive to light.  Neck:     Thyroid: No thyromegaly.     Vascular: No carotid bruit or JVD.  Cardiovascular:     Rate and Rhythm: Normal rate.     Heart sounds: Normal heart sounds.     No gallop.  Pulmonary:     Effort: Pulmonary effort is normal. No respiratory distress.     Breath sounds: Normal breath sounds. No wheezing or rales.  Abdominal:     General: There is no distension or abdominal bruit.     Palpations: Abdomen is soft.  Musculoskeletal:     Cervical back: Normal range of motion and neck supple. No tenderness.     Right lower leg: No edema.     Left lower leg: No edema.  Lymphadenopathy:     Cervical: No cervical adenopathy.  Skin:    General: Skin is warm and dry.     Coloration: Skin is not jaundiced or pale.     Findings: No bruising or rash.  Neurological:     Mental Status: She is alert.     Cranial Nerves: No cranial nerve deficit.     Motor: No weakness.     Coordination: Coordination normal.     Deep Tendon Reflexes: Reflexes are normal and symmetric. Reflexes normal.  Psychiatric:        Attention and Perception: Attention normal.        Mood and Affect: Mood normal.        Cognition and Memory: Cognition normal.     Comments: Candidly discusses symptoms and stressors               Assessment & Plan:   Problem List Items Addressed This Visit       Other   Black stools - Primary    Most likely due to chronic iron oral suppl She is more tired however Has h/o avm in sm bowel in the past Rev last colonoscopy and EGD Reassuring exam inst to hold iron 1 wk Then do a series of 3 heme cards and return  If positive at that time will need further w/u  Lab today as well incl cbc and  iron      Relevant Orders   Hemoccult Cards (X3 cards)   Fatigue    Lab today  May be due to grief/depression as well        Relevant Orders   Comprehensive metabolic panel   CBC with Differential/Platelet   TSH   Iron deficiency anemia    Takes oral iron  Dark stools noted  Lab today with more fatigue  Will check stool heme cards after holding iron as well      Relevant Orders   CBC with  Differential/Platelet   Ferritin   Iron   Hemoccult Cards (X3 cards)   PND (post-nasal drip)    Recommend one of the less sedating antihistamines otc Reassuring exam Suspect from seaonal allergies

## 2021-12-11 NOTE — Patient Instructions (Addendum)
For allergy post nasal drip over the counter antihistamines may help   Claritin  Allegra Zyrtec Xyzal   Benadryl is very sedating   Flonase is helpful -you can try it every 3-4 days   Hold your iron for 7 days   Do the stool heme cards for 3 separate stools on 3 separate days   Once you finish those you can re start the iron

## 2021-12-11 NOTE — Assessment & Plan Note (Signed)
Most likely due to chronic iron oral suppl She is more tired however Has h/o avm in sm bowel in the past Rev last colonoscopy and EGD Reassuring exam inst to hold iron 1 wk Then do a series of 3 heme cards and return  If positive at that time will need further w/u  Lab today as well incl cbc and iron

## 2021-12-15 ENCOUNTER — Other Ambulatory Visit: Payer: Medicare Other

## 2021-12-16 DIAGNOSIS — M3501 Sicca syndrome with keratoconjunctivitis: Secondary | ICD-10-CM | POA: Diagnosis not present

## 2021-12-17 ENCOUNTER — Other Ambulatory Visit (INDEPENDENT_AMBULATORY_CARE_PROVIDER_SITE_OTHER): Payer: Medicare Other

## 2021-12-17 DIAGNOSIS — K921 Melena: Secondary | ICD-10-CM | POA: Diagnosis not present

## 2021-12-17 DIAGNOSIS — D500 Iron deficiency anemia secondary to blood loss (chronic): Secondary | ICD-10-CM

## 2021-12-17 DIAGNOSIS — I872 Venous insufficiency (chronic) (peripheral): Secondary | ICD-10-CM | POA: Diagnosis not present

## 2021-12-17 LAB — HEMOCCULT SLIDES (X 3 CARDS)
Fecal Occult Blood: NEGATIVE
OCCULT 1: NEGATIVE
OCCULT 2: NEGATIVE
OCCULT 3: NEGATIVE
OCCULT 4: NEGATIVE
OCCULT 5: NEGATIVE

## 2021-12-24 NOTE — Telephone Encounter (Signed)
Error

## 2022-01-01 ENCOUNTER — Ambulatory Visit (INDEPENDENT_AMBULATORY_CARE_PROVIDER_SITE_OTHER): Payer: Medicare Other

## 2022-01-01 ENCOUNTER — Other Ambulatory Visit (INDEPENDENT_AMBULATORY_CARE_PROVIDER_SITE_OTHER): Payer: Medicare Other

## 2022-01-01 DIAGNOSIS — Z7901 Long term (current) use of anticoagulants: Secondary | ICD-10-CM | POA: Diagnosis not present

## 2022-01-01 DIAGNOSIS — R5382 Chronic fatigue, unspecified: Secondary | ICD-10-CM

## 2022-01-01 DIAGNOSIS — I4891 Unspecified atrial fibrillation: Secondary | ICD-10-CM

## 2022-01-01 DIAGNOSIS — D500 Iron deficiency anemia secondary to blood loss (chronic): Secondary | ICD-10-CM

## 2022-01-01 DIAGNOSIS — Z5181 Encounter for therapeutic drug level monitoring: Secondary | ICD-10-CM

## 2022-01-01 DIAGNOSIS — R638 Other symptoms and signs concerning food and fluid intake: Secondary | ICD-10-CM | POA: Diagnosis not present

## 2022-01-01 LAB — CBC WITH DIFFERENTIAL/PLATELET
Basophils Absolute: 0 10*3/uL (ref 0.0–0.1)
Basophils Relative: 1.2 % (ref 0.0–3.0)
Eosinophils Absolute: 0.1 10*3/uL (ref 0.0–0.7)
Eosinophils Relative: 4.1 % (ref 0.0–5.0)
HCT: 39.3 % (ref 36.0–46.0)
Hemoglobin: 13 g/dL (ref 12.0–15.0)
Lymphocytes Relative: 29.4 % (ref 12.0–46.0)
Lymphs Abs: 1.1 10*3/uL (ref 0.7–4.0)
MCHC: 33.2 g/dL (ref 30.0–36.0)
MCV: 87 fl (ref 78.0–100.0)
Monocytes Absolute: 0.4 10*3/uL (ref 0.1–1.0)
Monocytes Relative: 10.1 % (ref 3.0–12.0)
Neutro Abs: 2 10*3/uL (ref 1.4–7.7)
Neutrophils Relative %: 55.2 % (ref 43.0–77.0)
Platelets: 132 10*3/uL — ABNORMAL LOW (ref 150.0–400.0)
RBC: 4.52 Mil/uL (ref 3.87–5.11)
RDW: 14.7 % (ref 11.5–15.5)
WBC: 3.7 10*3/uL — ABNORMAL LOW (ref 4.0–10.5)

## 2022-01-01 LAB — COMPREHENSIVE METABOLIC PANEL
ALT: 12 U/L (ref 0–35)
AST: 18 U/L (ref 0–37)
Albumin: 3.9 g/dL (ref 3.5–5.2)
Alkaline Phosphatase: 36 U/L — ABNORMAL LOW (ref 39–117)
BUN: 25 mg/dL — ABNORMAL HIGH (ref 6–23)
CO2: 29 mEq/L (ref 19–32)
Calcium: 8.9 mg/dL (ref 8.4–10.5)
Chloride: 106 mEq/L (ref 96–112)
Creatinine, Ser: 0.75 mg/dL (ref 0.40–1.20)
GFR: 71.7 mL/min (ref >60.00)
Glucose, Bld: 95 mg/dL (ref 70–99)
Potassium: 4.1 mEq/L (ref 3.5–5.1)
Sodium: 142 mEq/L (ref 135–145)
Total Bilirubin: 0.7 mg/dL (ref 0.2–1.2)
Total Protein: 6.6 g/dL (ref 6.0–8.3)

## 2022-01-01 LAB — POCT INR: INR: 2.7 (ref 2.0–3.0)

## 2022-01-01 LAB — FERRITIN: Ferritin: 88.5 ng/mL (ref 10.0–291.0)

## 2022-01-01 LAB — TSH: TSH: 3.89 u[IU]/mL (ref 0.35–5.50)

## 2022-01-01 LAB — IRON: Iron: 60 ug/dL (ref 42–145)

## 2022-01-01 NOTE — Patient Instructions (Signed)
Continue 1 tablet daily. Recheck in 4 weeks.   

## 2022-01-01 NOTE — Progress Notes (Signed)
Continue 1 tablet daily. Recheck in 4 weeks.   

## 2022-01-02 LAB — CORTISOL-AM, BLOOD: Cortisol - AM: 19 ug/dL

## 2022-01-06 LAB — ACTH: C206 ACTH: 43 pg/mL (ref 6–50)

## 2022-01-21 DIAGNOSIS — H43811 Vitreous degeneration, right eye: Secondary | ICD-10-CM | POA: Diagnosis not present

## 2022-01-22 ENCOUNTER — Encounter: Payer: Medicare Other | Admitting: Internal Medicine

## 2022-01-23 ENCOUNTER — Ambulatory Visit (INDEPENDENT_AMBULATORY_CARE_PROVIDER_SITE_OTHER): Payer: Medicare Other

## 2022-01-23 DIAGNOSIS — I4430 Unspecified atrioventricular block: Secondary | ICD-10-CM

## 2022-01-23 LAB — CUP PACEART REMOTE DEVICE CHECK
Battery Remaining Longevity: 109 mo
Battery Voltage: 3.02 V
Brady Statistic AP VP Percent: 0 %
Brady Statistic AP VS Percent: 0 %
Brady Statistic AS VP Percent: 98.08 %
Brady Statistic AS VS Percent: 1.92 %
Brady Statistic RA Percent Paced: 0 %
Brady Statistic RV Percent Paced: 98.08 %
Date Time Interrogation Session: 20231102225204
Implantable Lead Connection Status: 753985
Implantable Lead Implant Date: 20210507
Implantable Lead Location: 753860
Implantable Lead Model: 5076
Implantable Pulse Generator Implant Date: 20210507
Lead Channel Impedance Value: 3306 Ohm
Lead Channel Impedance Value: 3306 Ohm
Lead Channel Impedance Value: 418 Ohm
Lead Channel Impedance Value: 456 Ohm
Lead Channel Pacing Threshold Amplitude: 0.625 V
Lead Channel Pacing Threshold Pulse Width: 0.4 ms
Lead Channel Sensing Intrinsic Amplitude: 1.875 mV
Lead Channel Sensing Intrinsic Amplitude: 8.625 mV
Lead Channel Sensing Intrinsic Amplitude: 8.625 mV
Lead Channel Setting Pacing Amplitude: 2.5 V
Lead Channel Setting Pacing Pulse Width: 0.4 ms
Lead Channel Setting Sensing Sensitivity: 4 mV
Zone Setting Status: 755011

## 2022-01-29 ENCOUNTER — Ambulatory Visit: Payer: Medicare Other

## 2022-01-30 ENCOUNTER — Telehealth: Payer: Self-pay | Admitting: Family Medicine

## 2022-01-30 NOTE — Telephone Encounter (Signed)
Called pt and rescheduled appt to 12/7

## 2022-01-30 NOTE — Telephone Encounter (Signed)
Patient called in and wants to change her appointment for her coumadin clinic. She will be out of town. Thank you!

## 2022-02-02 NOTE — Progress Notes (Signed)
Remote pacemaker transmission.   

## 2022-02-05 ENCOUNTER — Ambulatory Visit: Payer: Medicare Other

## 2022-02-26 ENCOUNTER — Ambulatory Visit (INDEPENDENT_AMBULATORY_CARE_PROVIDER_SITE_OTHER): Payer: Medicare Other

## 2022-02-26 DIAGNOSIS — Z7901 Long term (current) use of anticoagulants: Secondary | ICD-10-CM

## 2022-02-26 LAB — POCT INR: INR: 3.2 — AB (ref 2.0–3.0)

## 2022-02-26 NOTE — Patient Instructions (Addendum)
Pre visit review using our clinic review tool, if applicable. No additional management support is needed unless otherwise documented below in the visit note.  Decrease dose today to take 1/2 tablet and the continue 1 tablet daily . Recheck in 4 weeks.

## 2022-02-26 NOTE — Progress Notes (Signed)
Decrease dose today to take 1/2 tablet and the continue 1 tablet daily . Recheck in 4 weeks.

## 2022-02-27 ENCOUNTER — Ambulatory Visit: Payer: Medicare Other | Attending: Internal Medicine | Admitting: Internal Medicine

## 2022-02-27 ENCOUNTER — Other Ambulatory Visit: Payer: Self-pay | Admitting: Family Medicine

## 2022-02-27 ENCOUNTER — Encounter: Payer: Self-pay | Admitting: Internal Medicine

## 2022-02-27 VITALS — BP 146/82 | HR 71 | Ht 64.0 in | Wt 171.2 lb

## 2022-02-27 DIAGNOSIS — Z9889 Other specified postprocedural states: Secondary | ICD-10-CM | POA: Diagnosis not present

## 2022-02-27 DIAGNOSIS — Z950 Presence of cardiac pacemaker: Secondary | ICD-10-CM | POA: Insufficient documentation

## 2022-02-27 DIAGNOSIS — I4821 Permanent atrial fibrillation: Secondary | ICD-10-CM | POA: Insufficient documentation

## 2022-02-27 NOTE — Progress Notes (Unsigned)
Electrophysiology Office Note   Date:  02/27/2022   ID:  [name] WINWARD, DOB 1933-01-09, MRN [id number]  PCP:  Abner Greenspan, MD  Cardiologist:   Primary Electrophysiologist:  Virl Axe, MD    No chief complaint on file.     History of Present Illness: [name] is a 88 y.o. female seen for atrial fibrillation.  Now permanent.  Anticoagulation with warfarin.  And with AV junction ablation and pacing 5/21. no bleeding  fee;s cold  Stroke 2/21.     Dyspnea on exertion of 400 feet walking up the driveway with a slight incline.  No orthopnea nocturnal dyspnea denies edema no abdominal distention no chest pain.  Also extreme lassitude.  Some degree of depression does not really feel like it doing things, involved in church are relatively isolated  Her   son, [name], 06/08/20  She found him dead in the home.    DATE TEST EF   10/14 Echo   55-60 %   2/17 Echo   50-55 %   9/18 Echo  55-65%   3/21 TEE 55-65%      Date Cr TSH Hgb  6/18    13.9  2/19 0.74  9.8  1/20 0.83 4.06(11/19) 12.6  6/20 0.84 5.04 12.1  8/20  3.74   5/21 0.84 3.81 12.8  8/22 0.65 5.131 13.2  10/23 0.75 3.89 13.0    Past Medical History:  Diagnosis Date   Allergic rhinitis    Alopecia 2/2 beta blockers    Anemia    Arthritis    Atrial fibrillation -persistent cardiologist-  dr Myrel Rappleye/  primary EP -- [facility]aw[facility] ([facility])   a. s/p PVI [name] 2010;  b. on tikosyn/coumadin;  c. 06-08-2009 Echo: EF 60-65%, Gr 2 DD. (first dx 09/ 06/08/2005)   Bilateral lower extremity edema    Bleeding hemorrhoid    Carotid stenosis    mild (hosp 3/11)- consult by vasc/ Dr Donnetta Hutching   Complication of anesthesia    hard to wake   Diverticulosis of colon    Dyspnea    on exertion-climbing stairs   Fatty liver    H/O cardiac radiofrequency ablation    01/ June 09, 2006 at [place] [facility] [place] /  03/ 06-08-08  at [facility] failure with preserved ejection fraction [facility])    History of adenomatous polyp of colon    tubular  adenoma's   History of cardiomyopathy    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 (annuloplasty ring procedure)   S/P placement of cardiac pacemaker MDT 07/28/19 07/29/2019   Past Surgical History:  Procedure Laterality Date   APPENDECTOMY  1978   AV NODE ABLATION N/A 07/28/2019   Procedure: AV NODE ABLATION;  Surgeon: Deboraha Sprang, MD;  Location: [place] CV LAB;  Service: Cardiovascular;  Laterality: N/A;  BUBBLE STUDY  06/19/2019   Procedure: BUBBLE STUDY;  Surgeon: Pixie Casino, MD;  Location: [facility] ENDOSCOPY;  Service: Cardiovascular;;   CARDIAC ELECTROPHYSIOLOGY [place]  01/ 2008    at [place]   right-sided ablation atrial flutter   [place]  03/ 2010   dr Jaymes Graff at [facility]   AV node ablation and pulmonary vein isolation for atrial fib   CARDIOVERSION  06-18-2006;  07-13-2006;  10-19-2010;  10-27-2010   CATARACT EXTRACTION W/PHACO Right 04/09/2021   Procedure: CATARACT EXTRACTION PHACO AND INTRAOCULAR LENS PLACEMENT (IOC) RIGHT 6.71 01:11.1;  Surgeon: Leandrew Koyanagi, MD;  Location: [place];  Service: Ophthalmology;  Laterality: Right;   CATARACT EXTRACTION W/PHACO Left 04/23/2021   Procedure: CATARACT EXTRACTION PHACO AND INTRAOCULAR LENS PLACEMENT ([place]) LEFT;  Surgeon: Leandrew Koyanagi, MD;  Location: [place];  Service: Ophthalmology;   Laterality: Left;  [place] 5.65 00:50.1   COLONOSCOPY     COLONOSCOPY WITH PROPOFOL N/A 10/13/2017   Procedure: COLONOSCOPY WITH PROPOFOL;  Surgeon: [name], MD;  Location: [facility] ENDOSCOPY;  Service: Gastroenterology;  Laterality: N/A;   COLONOSCOPY WITH PROPOFOL N/A 02/20/2020   Procedure: COLONOSCOPY WITH PROPOFOL;  Surgeon: Lesly Rubenstein, MD;  Location: [facility] ENDOSCOPY;  Service: Endoscopy;  Laterality: N/A;   CYSTO/ TRANSURETHRAL COLLAGEN INJECTION THERAPY  07-26-2007   dr Matilde Sprang   DILATION AND CURETTAGE OF UTERUS     ESOPHAGOGASTRODUODENOSCOPY (EGD) WITH PROPOFOL N/A 10/13/2017   Procedure: ESOPHAGOGASTRODUODENOSCOPY (EGD) WITH PROPOFOL;  Surgeon: [name], MD;  Location: [facility] ENDOSCOPY;  Service: Gastroenterology;  Laterality: N/A;   EVALUATION UNDER ANESTHESIA WITH HEMORRHOIDECTOMY N/A 04/09/2020   Procedure: EXAM UNDER ANESTHESIA WITH HEMORRHOIDECTOMY;  Surgeon: Jules Husbands, MD;  Location: [facility] ORS;  Service: General;  Laterality: N/A;   EXCISIONAL HEMORRHOIDECTOMY  1980s   GIVENS CAPSULE STUDY N/A 12/08/2017   Procedure: GIVENS CAPSULE STUDY;  Surgeon: [name], MD;  Location: [facility] ENDOSCOPY;  Service: Gastroenterology;  Laterality: N/A;   HEMORRHOID SURGERY N/A 10/29/2016   Procedure: HEMORRHOIDECTOMY;  Surgeon: Leighton Ruff, MD;  Location: [facility];  Service: General;  Laterality: N/A;   MITRAL VALVE ANNULOPLASTY  10/23/1998   "Model 4625; [facility] [id number]"; [facility] 72m[facility]  PACEMAKER IMPLANT N/A 07/28/2019   Procedure: PACEMAKER IMPLANT;  Surgeon: KDeboraha Sprang MD;  [facility];  Service: Cardiovascular;  Laterality: N/A;   [place]  TE[facility]ION  [facility]  [facility]  TEE WITHOUT CARDIOVERSION N/A 06/19/2019   Procedure: TRANSESOPHAGEAL ECHOCARDIOGRAM (TEE);  Surgeon: HPixie Casino MD;  [facility];  Service: Cardiovascular;  Laterality:  N/A;   TOTAL HIP ARTHROPLASTY Left 05/04/2017   Procedure: LEFT TOTAL HIP ARTHROPLASTY ANTERIOR APPROACH;  Surgeon: BMcarthur Rossetti MD;  [place]  Service: Orthopedics;  Laterality: Left;   TRANSTHORACIC ECHOCARDIOGRAM  05-01-2015   dr kCaryl Comes  ef 50-55%/  mild AV sclerosis without stenosis/  post MV repair with mild central MR (valve area by pressure half-time 2cm^2,  valve area by continutity equation 0.91cm^2, peak grandiant 870mg)/  severe LAE/ mild TR/ mild RAE    TUBAL LIGATION Bilateral 1978     Current Outpatient Medications  Medication Sig Dispense Refill   acetaminophen (TYLENOL) 500 MG tablet Take 500 mg by mouth every 6 (six) hours as needed for moderate pain.     albuterol (VENTOLIN HFA) 108 (90 Base)  MCG/ACT inhaler Inhale into the lungs every 6 (six) hours as needed for wheezing or shortness of breath.     alendronate (FOSAMAX) 70 MG tablet TAKE 1 TABLET EVERY 7 DAYS. TAKE WITH A FULL GLASS OF WATER ON AN EMPTY STOMACH. 12 tablet 0   diphenhydrAMINE-zinc acetate (BENADRYL) cream Apply 1 application topically 3 (three) times daily as needed for itching.     ferrous sulfate 325 (65 FE) MG EC tablet TAKE 1 TABLET EVERY DAY WITH BREAKFAST 90 tablet 1   furosemide (LASIX) 40 MG tablet Take 40 mg by mouth daily as needed for edema.     hydrOXYzine (ATARAX/VISTARIL) 25 MG tablet Take 1 tablet (25 mg total) by mouth every 6 (six) hours as needed for itching. 30 tablet 0   levothyroxine (SYNTHROID) 75 MCG tablet Take 1 tablet (75 mcg total) by mouth daily before breakfast. 90 tablet 1   Polyethyl Glycol-Propyl Glycol 0.4-0.3 % SOLN Place 1-2 drops into both eyes 3 (three) times daily as needed (for dry eyes.).     warfarin (COUMADIN) 5 MG tablet TAKE 1 TABLET DAILY EXCEPT TAKE 1 AND 1/2 TABLETS ON WEDNESDAYS OR AS DIRECTED BY COUMADIN CLINIC 100 tablet 1   No current facility-administered medications for this visit.    Allergies:   Amiodarone, Penicillins, Amiodarone  hcl, and Statins  Were evaluated  ROS:  Please see the history of present illness. All other systems are reviewed and negative.    PHYSICAL EXAM: VS:  BP (!) 146/82   Pulse 71   Ht '5\' 4"'$  (1.626 m)   Wt 171 lb 3.2 oz (77.7 kg)   SpO2 98%   BMI 29.39 kg/m  , BMI Body mass index is 29.39 kg/m. Well developed and well nourished in no acute distress HENT normal Neck supple with JVP-10+HJR Clear Device pocket well healed; without hematoma or erythema.  There is no tethering  Regular rate and rhythm, no  gallop 2/6 apical murmur Abd-soft with active BS No Clubbing cyanosis tr edema Skin-warm and dry A & Oriented  Grossly normal sensory and motor function  ECG show fibrillation with ventricular pacing at 71 upright QRS lead I negative QRS lead V1  Device function is normal. Programming changes none  See Paceart for details        ASSESSMENT AND PLAN: Atrial fibrillation-permanent  AV junction ablation  Pacemaker-[employer]  Hypothyroidism-treated     HFpEF  Anemia   Obesity  Atrial fibrillation is permanent.  Wanted to take aspirin.  Will continue warfarin.  Volume overloaded.  Will have her increase her furosemide from as needed to daily x 5 days and every other day.  Will check a metabolic profile in 2 weeks to look at potassium.  Her heart rate excursion is reasonable.  If symptoms do not improve, she would be a candidate both for Farxiga as well as spironolactone would probably err on the side of the former as there are symptoms benefits with this  Lengthy discussions re fatigue about life.   Signed, Virl Axe, MD  02/27/2022 5:22 PM     [place] [address] [phone] (office) [phone] (fax)

## 2022-02-27 NOTE — Patient Instructions (Signed)
Medication Instructions:  Your physician has recommended you make the following change in your medication:   ** Take Furosemide '40mg'$  - 1 tablet by mouth daily x 5 days then every other day  *If you need a refill on your cardiac medications before your next appointment, please call your pharmacy*   Lab Work: None ordered.  If you have labs (blood work) drawn today and your tests are completely normal, you will receive your results only by: [place] (if you have MyChart) OR A paper copy in the mail If you have any lab test that is abnormal or we need to change your treatment, we will call you to review the results.   Testing/Procedures: None ordered.    Follow-Up: At [facility], you and your health needs are our priority.  As part of our continuing mission to provide you with exceptional heart care, we have created designated Provider Care Teams.  These Care Teams include your primary Cardiologist (physician) and Advanced Practice Providers (APPs -  Physician Assistants and Nurse Practitioners) who all work together to provide you with the care you need, when you need it.  We recommend signing up for the patient portal called "MyChart".  Sign up information is provided on this After Visit Summary.  MyChart is used to connect with patients for Virtual Visits (Telemedicine).  Patients are able to view lab/test results, encounter notes, upcoming appointments, etc.  Non-urgent messages can be sent to your provider as well.   To learn more about what you can do with MyChart, go to [email or web address].    Your next appointment:   6 weeks with Dr Olin Pia PA  Important Information About Sugar

## 2022-03-02 ENCOUNTER — Encounter (HOSPITAL_COMMUNITY): Payer: Self-pay

## 2022-03-02 ENCOUNTER — Telehealth: Payer: Self-pay | Admitting: Family Medicine

## 2022-03-02 ENCOUNTER — Emergency Department (HOSPITAL_COMMUNITY): Payer: Medicare Other

## 2022-03-02 ENCOUNTER — Emergency Department (HOSPITAL_COMMUNITY)
Admission: EM | Admit: 2022-03-02 | Discharge: 2022-03-02 | Disposition: A | Discharge status: Home / Self Care | Payer: Medicare Other | Attending: Emergency Medicine | Admitting: Emergency Medicine

## 2022-03-02 ENCOUNTER — Other Ambulatory Visit: Payer: Self-pay

## 2022-03-02 DIAGNOSIS — J4520 Mild intermittent asthma, uncomplicated: Secondary | ICD-10-CM | POA: Insufficient documentation

## 2022-03-02 DIAGNOSIS — R059 Cough, unspecified: Secondary | ICD-10-CM | POA: Diagnosis not present

## 2022-03-02 DIAGNOSIS — E039 Hypothyroidism, unspecified: Secondary | ICD-10-CM | POA: Diagnosis not present

## 2022-03-02 DIAGNOSIS — Z20822 Contact with and (suspected) exposure to covid-19: Secondary | ICD-10-CM | POA: Diagnosis not present

## 2022-03-02 DIAGNOSIS — J189 Pneumonia, unspecified organism: Secondary | ICD-10-CM | POA: Insufficient documentation

## 2022-03-02 DIAGNOSIS — Z7901 Long term (current) use of anticoagulants: Secondary | ICD-10-CM | POA: Diagnosis not present

## 2022-03-02 DIAGNOSIS — R0602 Shortness of breath: Secondary | ICD-10-CM | POA: Diagnosis not present

## 2022-03-02 DIAGNOSIS — Z950 Presence of cardiac pacemaker: Secondary | ICD-10-CM | POA: Diagnosis not present

## 2022-03-02 DIAGNOSIS — R600 Localized edema: Secondary | ICD-10-CM | POA: Diagnosis not present

## 2022-03-02 DIAGNOSIS — I10 Essential (primary) hypertension: Secondary | ICD-10-CM | POA: Diagnosis not present

## 2022-03-02 DIAGNOSIS — R000 Tachycardia, unspecified: Secondary | ICD-10-CM | POA: Diagnosis not present

## 2022-03-02 LAB — RESP PANEL BY RT-PCR (RSV, FLU A&B, COVID)  RVPGX2
Influenza A by PCR: NEGATIVE
Influenza B by PCR: NEGATIVE
Resp Syncytial Virus by PCR: NEGATIVE
SARS Coronavirus 2 by RT PCR: NEGATIVE

## 2022-03-02 LAB — BASIC METABOLIC PANEL
Anion gap: 9 (ref 5–15)
BUN: 13 mg/dL (ref 8–23)
CO2: 25 mmol/L (ref 22–32)
Calcium: 8.3 mg/dL — ABNORMAL LOW (ref 8.9–10.3)
Chloride: 103 mmol/L (ref 98–111)
Creatinine, Ser: 0.72 mg/dL (ref 0.44–1.00)
GFR, Estimated: >60 mL/min (ref >60)
Glucose, Bld: 106 mg/dL — ABNORMAL HIGH (ref 70–99)
Potassium: 3.9 mmol/L (ref 3.5–5.1)
Sodium: 137 mmol/L (ref 135–145)

## 2022-03-02 LAB — CBC WITH DIFFERENTIAL/PLATELET
Abs Immature Granulocytes: 0.05 10*3/uL (ref 0.00–0.07)
Basophils Absolute: 0 10*3/uL (ref 0.0–0.1)
Basophils Relative: 1 %
Eosinophils Absolute: 0.1 10*3/uL (ref 0.0–0.5)
Eosinophils Relative: 1 %
HCT: 36 % (ref 36.0–46.0)
Hemoglobin: 11.6 g/dL — ABNORMAL LOW (ref 12.0–15.0)
Immature Granulocytes: 1 %
Lymphocytes Relative: 13 %
Lymphs Abs: 1.2 10*3/uL (ref 0.7–4.0)
MCH: 28.6 pg (ref 26.0–34.0)
MCHC: 32.2 g/dL (ref 30.0–36.0)
MCV: 88.9 fL (ref 80.0–100.0)
Monocytes Absolute: 0.9 10*3/uL (ref 0.1–1.0)
Monocytes Relative: 11 %
Neutro Abs: 6.5 10*3/uL (ref 1.7–7.7)
Neutrophils Relative %: 73 %
Platelets: 147 10*3/uL — ABNORMAL LOW (ref 150–400)
RBC: 4.05 MIL/uL (ref 3.87–5.11)
RDW: 14.6 % (ref 11.5–15.5)
WBC: 8.7 10*3/uL (ref 4.0–10.5)
nRBC: 0 % (ref 0.0–0.2)

## 2022-03-02 LAB — BRAIN NATRIURETIC PEPTIDE: B Natriuretic Peptide: 197.7 pg/mL — ABNORMAL HIGH (ref 0.0–100.0)

## 2022-03-02 LAB — PROTIME-INR
INR: 2.1 — ABNORMAL HIGH (ref 0.8–1.2)
Prothrombin Time: 23.4 seconds — ABNORMAL HIGH (ref 11.4–15.2)

## 2022-03-02 LAB — TROPONIN I (HIGH SENSITIVITY)
Troponin I (High Sensitivity): 14 ng/L (ref <18)
Troponin I (High Sensitivity): 14 ng/L (ref <18)

## 2022-03-02 MED ORDER — IPRATROPIUM-ALBUTEROL 0.5-2.5 (3) MG/3ML IN SOLN
3.0000 mL | Freq: Once | RESPIRATORY_TRACT | Status: AC
  Administered 2022-03-02: 3 mL via RESPIRATORY_TRACT
  Filled 2022-03-02: qty 3

## 2022-03-02 MED ORDER — LEVOFLOXACIN 500 MG PO TABS
500.0000 mg | ORAL_TABLET | Freq: Every day | ORAL | Status: DC
  Administered 2022-03-02: 500 mg via ORAL
  Filled 2022-03-02: qty 1

## 2022-03-02 MED ORDER — LEVOFLOXACIN 500 MG PO TABS: 500.0000 mg | ORAL_TABLET | Freq: Every day | ORAL | 0 refills | Status: DC

## 2022-03-02 NOTE — Telephone Encounter (Signed)
Aware, will watch for correspondence  

## 2022-03-02 NOTE — Telephone Encounter (Signed)
She is in ER now  Will watch for correspondence

## 2022-03-02 NOTE — ED Provider Notes (Signed)
[place] EMERGENCY DEPARTMENT Provider Note   CSN: [id number] Arrival date & time: 03/02/22  1033     History {Add pertinent medical, surgical, social history, OB history to HPI:1} Chief Complaint  Patient presents with   Shortness of Breath    RETTIE LAIRD is a 88 y.o. female.   Shortness of Breath  Patient with shortness of breath.  Had for around 2 to 3 weeks.  Cough.  Has had some congestion.  States worse over the last week.  Called PCP and was told to come here from the triage line.  Has had some mild sputum production.  No fevers.  Has felt more short of breath.  Feels as if she has a little history walking up hills.  No chest pain.   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 s/p PVI [name] 2010;  b. on tikosyn/coumadin;  c. 05/2009 Echo: EF 60-65%, Gr 2 DD. (first dx 09/ 2007)   Bilateral lower extremity edema    Bleeding hemorrhoid    Carotid stenosis    mild (hosp 3/11)- consult by vasc/ Dr Donnetta Hutching   Complication of anesthesia    hard to wake   Diverticulosis of colon    Dyspnea    on exertion-climbing stairs   Fatty liver    H/O cardiac radiofrequency ablation    01/ 2008 at [place] [facility] [place] /  03/ 2010  at [facility] failure with preserved ejection fraction [facility])    History of adenomatous polyp of colon    tubular adenoma's   History of cardiomyopathy    secondary tachycardia-induced cardiomyopathy -- resolved 2014   History of squamous cell carcinoma in situ (SCCIS) of skin    05/ 2017  nasal bridge and right medial knee   History of transient ischemic attack (TIA)    01-24-2005 and 06-12-2009   Hyperlipidemia    Hypothyroidism    Mild intermittent asthma    reacts to cats   Mixed stress and urge urinary incontinence    Presence of permanent cardiac  pacemaker    was put in 07/2019   Pulmonary nodule    S/P AV nodal ablation 07/28/19 07/29/2019   S/P mitral valve repair 10-23-1998  dr Boyce Medici at [facility]   for MVP and regurg. (annuloplasty ring procedure)   S/P placement of cardiac pacemaker MDT 07/28/19 07/29/2019    Home Medications Prior to Admission medications   Medication Sig Start Date End Date Taking? Authorizing Provider  acetaminophen (TYLENOL) 500 MG tablet Take 500 mg by mouth every 6 (six) hours as needed for moderate pain.    [provider]  albuterol (VENTOLIN HFA) 108 (90 Base) MCG/ACT inhaler Inhale into the lungs every 6 (six) hours as needed for wheezing or shortness of breath.    [provider]  alendronate (FOSAMAX) 70 MG tablet TAKE 1 TABLET EVERY 7 DAYS. TAKE WITH A FULL GLASS OF WATER ON AN EMPTY STOMACH. 11/14/21   Tower, Wynelle Fanny, MD  diphenhydrAMINE-zinc acetate (BENADRYL) cream Apply 1 application topically 3 (three) times daily as needed for itching.    [provider]  ferrous sulfate 325 (65 FE) MG EC tablet TAKE 1 TABLET EVERY DAY WITH BREAKFAST 04/28/21   Tower, Wynelle Fanny, MD  furosemide (LASIX) 40 MG tablet Take 40 mg by mouth daily as needed for edema.    [provider]  hydrOXYzine (ATARAX/VISTARIL) 25 MG tablet Take 1 tablet (25 mg total) by mouth every 6 (six) hours as needed for itching. 12/27/20   Kennith Gain, MD  levothyroxine (SYNTHROID) 75 MCG tablet TAKE 1 TABLET EVERY DAY BEFORE BREAKFAST 03/02/22   Tower, Wynelle Fanny, MD  Polyethyl Glycol-Propyl Glycol 0.4-0.3 % SOLN Place 1-2 drops into both eyes 3 (three) times daily as needed (for dry eyes.).    [provider]  warfarin (COUMADIN) 5 MG tablet TAKE 1 TABLET DAILY EXCEPT TAKE 1 AND 1/2 TABLETS ON WEDNESDAYS OR AS DIRECTED BY COUMADIN CLINIC 11/14/21   Tower, Wynelle Fanny, MD      Allergies    Amiodarone, Penicillins, Amiodarone hcl, and Statins    Review of Systems   Review of Systems   Respiratory:  Positive for shortness of breath.     Physical Exam Updated Vital Signs BP (!) 156/72   Pulse 72   Temp 99.5 F (37.5 C)   Resp 14   Ht '5\' 4"'$  (1.626 m)   Wt 77.1 kg   SpO2 98%   BMI 29.18 kg/m  Physical Exam Vitals and nursing note reviewed.  Cardiovascular:     Rate and Rhythm: Normal rate and regular rhythm.  Pulmonary:     Comments: Mildly harsh breath sounds. Chest:     Chest wall: No tenderness.  Musculoskeletal:     Cervical back: Neck supple.     Right lower leg: Edema present.     Left lower leg: Edema present.  Skin:    Capillary Refill: Capillary refill takes less than 2 seconds.  Neurological:     Mental Status: She is alert.     ED Results / Procedures / Treatments   Labs (all labs ordered are listed, but only abnormal results are displayed) Labs Reviewed  BASIC METABOLIC PANEL - Abnormal; Notable for the following components:      Result Value   Glucose, Bld 106 (*)    Calcium 8.3 (*)    All other components within normal limits  CBC WITH DIFFERENTIAL/PLATELET - Abnormal; Notable for the following components:   Hemoglobin 11.6 (*)    Platelets 147 (*)    All other components within normal limits  BRAIN NATRIURETIC PEPTIDE - Abnormal; Notable for the following components:   B Natriuretic Peptide 197.7 (*)    All other components within normal limits  RESP PANEL BY RT-PCR (RSV, FLU A&B, COVID)  RVPGX2  TROPONIN I (HIGH SENSITIVITY)  TROPONIN I (HIGH SENSITIVITY)    EKG None  Radiology DG Chest 2 View  Result Date: 03/02/2022 CLINICAL DATA:  Shortness of breath EXAM: CHEST - 2 VIEW COMPARISON:  10/08/2021 FINDINGS: Left-sided implanted cardiac device remains in place. Mild cardiomegaly status post sternotomy. Aortic atherosclerosis. Left perihilar airspace opacity. Coarsened bilateral perihilar and bibasilar interstitial markings. No pleural effusion or pneumothorax. IMPRESSION: Left perihilar airspace opacity, suspicious for  pneumonia. Electronically Signed   By: Davina Poke D.O.   On: 03/02/2022 13:19    Procedures Procedures  {Document cardiac monitor, telemetry assessment procedure when appropriate:1}  Medications Ordered in ED Medications - No data to display  ED Course/ Medical Decision Making/ A&P                           Medical Decision Making  Patient is shortness of  breath and cough.  Is had for 2 weeks.  Differential diagnosis includes pneumonia CHF, pulmonary embolism.  Has had some sputum production.  Chest x-ray shows potential pneumonia.  Independently interpreted by me.  Will ambulate to see how she tolerates.  Not hypoxic at rest.  Lab work reassuring.  BNP is mildly elevated which could be secondary to the pneumonia also does have a little bit of edema on her legs but no CHF on the x-ray.  {Document critical care time when appropriate:1} {Document review of labs and clinical decision tools ie heart score, Chads2Vasc2 etc:1}  {Document your independent review of radiology images, and any outside records:1} {Document your discussion with family members, caretakers, and with consultants:1} {Document social determinants of health affecting pt's care:1} {Document your decision making why or why not admission, treatments were needed:1} Final Clinical Impression(s) / ED Diagnoses Final diagnoses:  None    Rx / DC Orders ED Discharge Orders     None

## 2022-03-02 NOTE — Telephone Encounter (Signed)
LVM for pt to rtn my call to schedule AWV with NHA call back # [phone] 

## 2022-03-02 NOTE — ED Triage Notes (Signed)
PT BIB [facility] from home c/o coughing and SHOB x3 days. Pt states she tried her albuterol at home and she felt like it made it worse so she called EMS.

## 2022-03-02 NOTE — ED Notes (Signed)
Patient able to ambulate. Patient oxygen went down to 93% on RA with a HR of 77

## 2022-03-02 NOTE — ED Provider Triage Note (Signed)
Emergency Medicine Provider Triage Evaluation Note  [name] , a 88 y.o. female  was evaluated in triage.  Pt complains of worsening shortness of breath and cough over the last week.  Also reports increased congestion, though denies fevers, chills, or sore throat.  Reports having to cough every 30 seconds or so.  Hx of asthma usually triggered by allergies, though states this feels different.  Called her PCP this morning, was recommended come to the ED for further evaluation.  Denies chest pain or discomfort.  Hx of prior A-fib with successful ablation, depression, anxiety, heart failure with preserved ejection fraction.  Has pacemaker (07/2019).  Review of Systems  Positive:  Negative: See above  Physical Exam  BP (!) 142/50 (BP Location: Right Arm)   Pulse 71   Temp 100.2 F (37.9 C) (Oral)   Resp 14   Ht '5\' 4"'$  (1.626 m)   Wt 77.1 kg   SpO2 96%   BMI 29.18 kg/m  Gen:   Awake, no distress, sitting comfortably Resp:  Normal effort, without increased respiratory effort, every 30 seconds or so appears to have a short coughing episode. MSK:   Moves extremities without difficulty  Other:  Chest non-TTP.  HR regularly regular.  No pitting edema of lower extremities.  Abdomen soft nonttp  Medical Decision Making  Medically screening exam initiated at 11:50 AM.  Appropriate orders placed.  GAYTHA RAYBOURN was informed that the remainder of the evaluation will be completed by another provider, this initial triage assessment does not replace that evaluation, and the importance of remaining in the ED until their evaluation is complete.     Prince Rome, PA-C 07/86/75 1159

## 2022-03-02 NOTE — Telephone Encounter (Signed)
Patient called and stated she has been having congestion, red spots in mucus and having severe breathing issues. Patient was sent to access nurse.

## 2022-03-02 NOTE — Telephone Encounter (Signed)
Per chart review tab pt is presently at [facility] ED. Sending note to Dr Glori Bickers and [employer].

## 2022-03-02 NOTE — Telephone Encounter (Signed)
[place] Night - Client TELEPHONE ADVICE RECORD [facility] Patient Name: [name] [place] Gender: Female DOB: 09-29-1932 Age: 88 Y 21 M 2 D Return Phone Number: [phone] (Primary) Address: City/ State/ Zip: [place]  [address] Client [facility] Night - Client Client Site [place] Provider [facility], Roque Lias - MD Contact Type Call Who Is Calling Patient / Member / Family / Caregiver Call Type Triage / Clinical Relationship To Patient Self Return Phone Number [phone] (Primary) Chief Complaint BREATHING - fast, heavy or wheezing Reason for Call Symptomatic / Request for [name] states she is calling to get a refill on her inhaler. She states she has heavy congestion and she is having some difficulty breathing. She states she is about out and needs it for emergencies Translation No Nurse Assessment Nurse: D'Heur Lucia Gaskins, RN, Adrienne Date/Time (Eastern Time): 02/28/2022 11:11:08 AM Confirm and document reason for call. If symptomatic, describe symptoms. ---Caller states she is calling to get a refill on her inhaler (Qvar ). She states she has heavy congestion and coughing; She has yellow-green mucus and she is having some difficulty breathing. She states she is about out and needs it for emergencies. No fever. Does the patient have any new or worsening symptoms? ---Yes Will a triage be completed? ---Yes Related visit to physician within the last 2 weeks? ---No Does the PT have any chronic conditions? (i.e. diabetes, asthma, this includes High risk factors for pregnancy, etc.) ---Yes List chronic conditions. ---hypothyroid, pacemaker, Is this a behavioral health or substance abuse call? ---No Guidelines Guideline Title Affirmed Question Affirmed Notes Nurse Date/Time (Eastern Time) Breathing Difficulty [1] MILD difficulty breathing (e.g., minimal/no  SOB at rest, SOB with walking, pulse <100) AND [2] NEW-onset D'Heur Lucia Gaskins, RN, Vincente Liberty 02/28/2022 11:15:55 AM PLEASE NOTE: All timestamps contained within this report are represented as [place] Standard Time. CONFIDENTIALTY NOTICE: This fax transmission is intended only for the addressee. It contains information that is legally privileged, confidential or otherwise protected from use or disclosure. If you are not the intended recipient, you are strictly prohibited from reviewing, disclosing, copying using or disseminating any of this information or taking any action in reliance on or regarding this information. If you have received this fax in error, please notify us immediately by telephone so that we can arrange for its return to [name]. Phone: [phone], Toll-Free: [phone], Fax: [phone] Page: 2 of 2 Call Id: [id number] Guidelines Guideline Title Affirmed Question Affirmed Notes Nurse Date/Time Eilene Ghazi Time) or WORSE than normal Disp. Time Eilene Ghazi Time) Disposition Final User 02/28/2022 11:07:37 AM Send to Urgent Queue Susette Racer 02/28/2022 11:26:23 AM See HCP within 4 Hours (or PCP triage) Yes D'Heur Lucia Gaskins, RN, Adrienne Final Disposition 02/28/2022 11:26:23 AM See HCP within 4 Hours (or PCP triage) Yes D'Heur Lucia Gaskins, RN, Ebbie Latus Disagree/Comply Comply Caller Understands Yes PreDisposition Call Doctor Care Advice Given Per Guideline SEE HCP (OR PCP TRIAGE) WITHIN 4 HOURS: * IF OFFICE WILL BE OPEN: You need to be seen within the next 3 or 4 hours. Call your doctor (or NP/PA) now or as soon as the office opens. CALL BACK IF: * You become worse CARE ADVICE given per Breathing Difficulty (Adult) guideline. Referrals [facility]

## 2022-03-02 NOTE — Discharge Instructions (Signed)
Decrease your warfarin dose to half the dose while you are on the antibiotic.  Follow-up with your doctor.  Have the INR checked later this week.

## 2022-03-06 ENCOUNTER — Telehealth: Payer: Self-pay

## 2022-03-06 NOTE — Progress Notes (Signed)
    Chronic Care Management Pharmacy Assistant   Name: [name]  MRN: [id number] DOB: 05-Jul-1932  Reason for Encounter: CCM [facility] Follow-Up)  Medications: Outpatient Encounter Medications as of 03/06/2022  Medication Sig   acetaminophen (TYLENOL) 500 MG tablet Take 500 mg by mouth every 6 (six) hours as needed for moderate pain.   albuterol (VENTOLIN HFA) 108 (90 Base) MCG/ACT inhaler Inhale into the lungs every 6 (six) hours as needed for wheezing or shortness of breath.   alendronate (FOSAMAX) 70 MG tablet TAKE 1 TABLET EVERY 7 DAYS. TAKE WITH A FULL GLASS OF WATER ON AN EMPTY STOMACH.   diphenhydrAMINE-zinc acetate (BENADRYL) cream Apply 1 application topically 3 (three) times daily as needed for itching.   ferrous sulfate 325 (65 FE) MG EC tablet TAKE 1 TABLET EVERY DAY WITH BREAKFAST   furosemide (LASIX) 40 MG tablet Take 40 mg by mouth daily as needed for edema.   hydrOXYzine (ATARAX/VISTARIL) 25 MG tablet Take 1 tablet (25 mg total) by mouth every 6 (six) hours as needed for itching.   levofloxacin (LEVAQUIN) 500 MG tablet Take 1 tablet (500 mg total) by mouth daily.   levothyroxine (SYNTHROID) 75 MCG tablet TAKE 1 TABLET EVERY DAY BEFORE BREAKFAST   Polyethyl Glycol-Propyl Glycol 0.4-0.3 % SOLN Place 1-2 drops into both eyes 3 (three) times daily as needed (for dry eyes.).   warfarin (COUMADIN) 5 MG tablet TAKE 1 TABLET DAILY EXCEPT TAKE 1 AND 1/2 TABLETS ON WEDNESDAYS OR AS DIRECTED BY COUMADIN CLINIC   No facility-administered encounter medications on file as of 03/06/2022.   Reviewed hospital notes for details of recent visit. Has patient been contacted by Transitions of Care team? No Has patient seen PCP/specialist for hospital follow up (summarize OV if yes): No  Admitted to the ED on 03/02/2022. Discharge date was 03/03/2022.  Discharged from [facility].   Discharge diagnosis (Principal Problem): Community acquired pnemonia Patient was discharged to  Home  Brief summary of hospital course: Patient is shortness of breath and cough.  Is had for 2 weeks.  Differential diagnosis includes pneumonia CHF, pulmonary embolism.  Has had some sputum production.  Chest x-ray shows potential pneumonia.  Independently interpreted by me.  Will ambulate to see how she tolerates.  Not hypoxic at rest.  Lab work reassuring.  BNP is mildly elevated which could be secondary to the pneumonia also does have a little bit of edema on her legs but no CHF on the x-ray.   Discussed with pharmacy.  INR done.  With allergies and interactions with think Levaquin is the best antibiotic this time.  Will cut warfarin dosing in half and will recheck later this week.  Appears stable for discharge home.  New?Medications Started at [facility] Discharge:?? -Started levofloxacin (LEVAQUIN) 500 MG tablet    Medications that remain the same after Hospital Discharge:??  -All other medications will remain the same.    Next CCM appt: 05/27/2022  Other upcoming appts: PCP appointment on 03/18/2022 [facility] PCP appointment on 03/19/2022 for Physical  Charlene Brooke, PharmD notified and will determine if action is needed.  Charlene Brooke, CPP notified  Marijean Niemann, [place] Clinical Pharmacy Assistant [phone]

## 2022-03-06 NOTE — Telephone Encounter (Addendum)
Reviewed recent ED encounter from 03/02/22. Warfarin dose halved while taking levofloxacin, it was recommended to re-check INR within a week. It does not appear that anti-coag visit has been scheduled (current appt is for 03/26/22), she does have appt for CPE labs on 12/21. Recommend to schedule anti-coag visit same day.

## 2022-03-06 NOTE — Telephone Encounter (Signed)
Will route to coumadin nurse

## 2022-03-06 NOTE — Telephone Encounter (Addendum)
Pt reports she will take her last abx today and has been told to take 1/2 of her dose of warfarin while taking abx. She will start normal dosing of warfarin tomorrow. Advised pt the need to check INR next week. Scheduled for 12/21 when she also has a lab apt. Pt agreed. Pt does report she is still tired from the pneumonia but is progressively feeling better.  Advised if any changes to contact the coumadin clinic. Pt verbalized understanding.

## 2022-03-11 ENCOUNTER — Telehealth: Payer: Self-pay | Admitting: Family Medicine

## 2022-03-11 DIAGNOSIS — D500 Iron deficiency anemia secondary to blood loss (chronic): Secondary | ICD-10-CM

## 2022-03-11 DIAGNOSIS — R600 Localized edema: Secondary | ICD-10-CM

## 2022-03-11 DIAGNOSIS — R7309 Other abnormal glucose: Secondary | ICD-10-CM

## 2022-03-11 DIAGNOSIS — E7849 Other hyperlipidemia: Secondary | ICD-10-CM

## 2022-03-11 DIAGNOSIS — E039 Hypothyroidism, unspecified: Secondary | ICD-10-CM

## 2022-03-11 NOTE — Telephone Encounter (Signed)
-----   Message from Velna Hatchet, RT sent at 03/05/2022 10:52 AM EST ----- Regarding: Thu 12/21 lab Patient is scheduled for cpx, please order future labs.  Thanks, Anda Kraft

## 2022-03-12 ENCOUNTER — Other Ambulatory Visit (INDEPENDENT_AMBULATORY_CARE_PROVIDER_SITE_OTHER): Payer: Medicare Other

## 2022-03-12 ENCOUNTER — Ambulatory Visit (INDEPENDENT_AMBULATORY_CARE_PROVIDER_SITE_OTHER): Payer: Medicare Other

## 2022-03-12 DIAGNOSIS — D500 Iron deficiency anemia secondary to blood loss (chronic): Secondary | ICD-10-CM | POA: Diagnosis not present

## 2022-03-12 DIAGNOSIS — Z7901 Long term (current) use of anticoagulants: Secondary | ICD-10-CM

## 2022-03-12 DIAGNOSIS — E7849 Other hyperlipidemia: Secondary | ICD-10-CM

## 2022-03-12 DIAGNOSIS — E039 Hypothyroidism, unspecified: Secondary | ICD-10-CM | POA: Diagnosis not present

## 2022-03-12 DIAGNOSIS — R600 Localized edema: Secondary | ICD-10-CM

## 2022-03-12 DIAGNOSIS — R7309 Other abnormal glucose: Secondary | ICD-10-CM | POA: Diagnosis not present

## 2022-03-12 LAB — CBC WITH DIFFERENTIAL/PLATELET
Basophils Absolute: 0 10*3/uL (ref 0.0–0.1)
Basophils Relative: 0.8 % (ref 0.0–3.0)
Eosinophils Absolute: 0.1 10*3/uL (ref 0.0–0.7)
Eosinophils Relative: 2.8 % (ref 0.0–5.0)
HCT: 37.7 % (ref 36.0–46.0)
Hemoglobin: 12.4 g/dL (ref 12.0–15.0)
Lymphocytes Relative: 31 % (ref 12.0–46.0)
Lymphs Abs: 1.6 10*3/uL (ref 0.7–4.0)
MCHC: 32.9 g/dL (ref 30.0–36.0)
MCV: 86.8 fl (ref 78.0–100.0)
Monocytes Absolute: 0.4 10*3/uL (ref 0.1–1.0)
Monocytes Relative: 8.3 % (ref 3.0–12.0)
Neutro Abs: 3 10*3/uL (ref 1.4–7.7)
Neutrophils Relative %: 57.1 % (ref 43.0–77.0)
Platelets: 218 10*3/uL (ref 150.0–400.0)
RBC: 4.34 Mil/uL (ref 3.87–5.11)
RDW: 15.3 % (ref 11.5–15.5)
WBC: 5.2 10*3/uL (ref 4.0–10.5)

## 2022-03-12 LAB — COMPREHENSIVE METABOLIC PANEL
ALT: 16 U/L (ref 0–35)
AST: 18 U/L (ref 0–37)
Albumin: 3.7 g/dL (ref 3.5–5.2)
Alkaline Phosphatase: 49 U/L (ref 39–117)
BUN: 25 mg/dL — ABNORMAL HIGH (ref 6–23)
CO2: 29 mEq/L (ref 19–32)
Calcium: 8.8 mg/dL (ref 8.4–10.5)
Chloride: 105 mEq/L (ref 96–112)
Creatinine, Ser: 0.78 mg/dL (ref 0.40–1.20)
GFR: 68.32 mL/min (ref >60.00)
Glucose, Bld: 91 mg/dL (ref 70–99)
Potassium: 4.8 mEq/L (ref 3.5–5.1)
Sodium: 142 mEq/L (ref 135–145)
Total Bilirubin: 0.4 mg/dL (ref 0.2–1.2)
Total Protein: 6.5 g/dL (ref 6.0–8.3)

## 2022-03-12 LAB — FERRITIN: Ferritin: 171.1 ng/mL (ref 10.0–291.0)

## 2022-03-12 LAB — LIPID PANEL
Cholesterol: 194 mg/dL (ref 0–200)
HDL: 34.8 mg/dL — ABNORMAL LOW (ref >39.00)
LDL Cholesterol: 134 mg/dL — ABNORMAL HIGH (ref 0–99)
NonHDL: 159.22
Total CHOL/HDL Ratio: 6
Triglycerides: 125 mg/dL (ref 0.0–149.0)
VLDL: 25 mg/dL (ref 0.0–40.0)

## 2022-03-12 LAB — TSH: TSH: 4.94 u[IU]/mL (ref 0.35–5.50)

## 2022-03-12 LAB — POCT INR: INR: 2.1 (ref 2.0–3.0)

## 2022-03-12 LAB — HEMOGLOBIN A1C: Hgb A1c MFr Bld: 6 % (ref 4.6–6.5)

## 2022-03-12 LAB — IRON: Iron: 66 ug/dL (ref 42–145)

## 2022-03-12 NOTE — Progress Notes (Signed)
In ER on 12/11 with pneumonia. Pt feeling better today. Continue 1 tablet daily . Recheck in 4 weeks.

## 2022-03-12 NOTE — Patient Instructions (Addendum)
Pre visit review using our clinic review tool, if applicable. No additional management support is needed unless otherwise documented below in the visit note.  Continue 1 tablet daily . Recheck in 4 weeks.  

## 2022-03-18 ENCOUNTER — Ambulatory Visit (INDEPENDENT_AMBULATORY_CARE_PROVIDER_SITE_OTHER): Payer: Medicare Other

## 2022-03-18 VITALS — Ht 64.0 in | Wt 170.0 lb

## 2022-03-18 DIAGNOSIS — Z0000 Encounter for general adult medical examination without abnormal findings: Secondary | ICD-10-CM | POA: Diagnosis not present

## 2022-03-18 NOTE — Progress Notes (Signed)
Subjective:   [name] is a 88 y.o. female who presents for Medicare Annual (Subsequent) preventive examination.  Review of Systems    No ROS.  Medicare Wellness Virtual Visit.  Visual/audio telehealth visit, UTA vital signs.   See social history for additional risk factors.   Cardiac Risk Factors include: advanced age (>17mn, >>83women)     Objective:    Today's Vitals   03/18/22 1121  Weight: 170 lb (77.1 kg)  Height: '5\' 4"'$  (1.626 m)   Body mass index is 29.18 kg/m.     03/18/2022   11:19 AM 10/08/2021    1:08 PM 04/23/2021   12:28 PM 03/10/2021    1:55 PM 03/02/2021    2:45 PM 04/09/2020    9:45 AM 04/05/2020   11:40 AM  Advanced Directives  Does Patient Have a Medical Advance Directive? No No No Yes Yes Yes Yes  Type of AScientist, research (medical)Living will [place]Living will Living will;Healthcare Power of Attorney Living will;Healthcare Power of Attorney  Does patient want to make changes to medical advance directive? No - Patient declined   Yes (MAU/Ambulatory/Procedural Areas - Information given)     Would patient like information on creating a medical advance directive?  No - Patient declined No - Guardian declined        Current Medications (verified) Outpatient Encounter Medications as of 03/18/2022  Medication Sig   acetaminophen (TYLENOL) 500 MG tablet Take 500 mg by mouth every 6 (six) hours as needed for moderate pain.   albuterol (VENTOLIN HFA) 108 (90 Base) MCG/ACT inhaler Inhale into the lungs every 6 (six) hours as needed for wheezing or shortness of breath.   alendronate (FOSAMAX) 70 MG tablet TAKE 1 TABLET EVERY 7 DAYS. TAKE WITH A FULL GLASS OF WATER ON AN EMPTY STOMACH.   diphenhydrAMINE-zinc acetate (BENADRYL) cream Apply 1 application topically 3 (three) times daily as needed for itching.   ferrous sulfate 325 (65 FE) MG EC tablet TAKE 1 TABLET EVERY DAY WITH BREAKFAST   furosemide (LASIX) 40 MG  tablet Take 40 mg by mouth daily as needed for edema.   hydrOXYzine (ATARAX/VISTARIL) 25 MG tablet Take 1 tablet (25 mg total) by mouth every 6 (six) hours as needed for itching.   levofloxacin (LEVAQUIN) 500 MG tablet Take 1 tablet (500 mg total) by mouth daily.   levothyroxine (SYNTHROID) 75 MCG tablet TAKE 1 TABLET EVERY DAY BEFORE BREAKFAST   Polyethyl Glycol-Propyl Glycol 0.4-0.3 % SOLN Place 1-2 drops into both eyes 3 (three) times daily as needed (for dry eyes.).   warfarin (COUMADIN) 5 MG tablet TAKE 1 TABLET DAILY EXCEPT TAKE 1 AND 1/2 TABLETS ON WEDNESDAYS OR AS DIRECTED BY COUMADIN CLINIC   No facility-administered encounter medications on file as of 03/18/2022.   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 s/p PVI [name] 2010;  b. on tikosyn/coumadin;  c. 05/2009 Echo: EF 60-65%, Gr 2 DD. (first dx 09/ 2007)   Bilateral lower extremity edema    Bleeding hemorrhoid    Carotid stenosis    mild (hosp 3/11)- consult by vasc/ Dr EDonnetta Hutching  Complication of anesthesia  hard to wake   Diverticulosis of colon    Dyspnea    on exertion-climbing stairs   Fatty liver    H/O cardiac radiofrequency ablation    01/ 2008 at [facility] /  03/ 2010  at [facility] failure with preserved ejection fraction [facility])    History of adenomatous polyp of colon    tubular adenoma's   History of cardiomyopathy    secondary tachycardia-induced cardiomyopathy -- resolved 2014   History of squamous cell carcinoma in situ (SCCIS) of skin    05/ 2017  nasal bridge and right medial knee   History of transient ischemic attack (TIA)    01-24-2005 and 06-12-2009   Hyperlipidemia    Hypothyroidism    Mild intermittent asthma    reacts to  cats   Mixed stress and urge urinary incontinence    Presence of permanent cardiac pacemaker    was put in 07/2019   Pulmonary nodule    S/P AV nodal ablation 07/28/19 07/29/2019   S/P mitral valve repair 10-23-1998  dr Boyce Medici at [facility]   for MVP and regurg. (annuloplasty ring procedure)   S/P placement of cardiac pacemaker MDT 07/28/19 07/29/2019   Past Surgical History:  Procedure Laterality Date   APPENDECTOMY  1978   AV NODE ABLATION N/A 07/28/2019   Procedure: AV NODE ABLATION;  Surgeon: Deboraha Sprang, MD;  Location: [place] CV LAB;  Service: Cardiovascular;  Laterality: N/A;   BUBBLE STUDY  06/19/2019   Procedure: BUBBLE STUDY;  Surgeon: Pixie Casino, MD;  Location: [facility] ENDOSCOPY;  Service: Cardiovascular;;   CARDIAC ELECTROPHYSIOLOGY [place]  01/ 2008    at [place]   right-sided ablation atrial flutter   [place]  03/ 2010   dr Jaymes Graff at [facility]   AV node ablation and pulmonary vein isolation for atrial fib   CARDIOVERSION  06-18-2006;  07-13-2006;  10-19-2010;  10-27-2010   CATARACT EXTRACTION W/PHACO Right 04/09/2021   Procedure: CATARACT EXTRACTION PHACO AND INTRAOCULAR LENS PLACEMENT ([place]) RIGHT 6.71 01:11.1;  Surgeon: Leandrew Koyanagi, MD;  Location: [facility];  Service: Ophthalmology;  Laterality: Right;   CATARACT EXTRACTION W/PHACO Left 04/23/2021   Procedure: CATARACT EXTRACTION PHACO AND INTRAOCULAR LENS PLACEMENT ([place]) LEFT;  Surgeon: Leandrew Koyanagi, MD;  Location: [facility];  Service: Ophthalmology;  Laterality: Left;  [place] 5.65 00:50.1   COLONOSCOPY     COLONOSCOPY WITH PROPOFOL N/A 10/13/2017   Procedure: COLONOSCOPY WITH PROPOFOL;  Surgeon: [name], MD;  Location: [facility] ENDOSCOPY;  Service: Gastroenterology;  Laterality: N/A;   COLONOSCOPY WITH PROPOFOL N/A 02/20/2020   Procedure: COLONOSCOPY WITH PROPOFOL;  Surgeon: Lesly Rubenstein, MD;  Location: [facility]  ENDOSCOPY;  Service: Endoscopy;  Laterality: N/A;   CYSTO/ TRANSURETHRAL COLLAGEN INJECTION THERAPY  07-26-2007   dr Matilde Sprang   DILATION AND CURETTAGE OF UTERUS     ESOPHAGOGASTRODUODENOSCOPY (EGD) WITH PROPOFOL N/A 10/13/2017   Procedure: ESOPHAGOGASTRODUODENOSCOPY (EGD) WITH PROPOFOL;  Surgeon: [name], MD;  Location: [facility] ENDOSCOPY;  Service: Gastroenterology;  Laterality: N/A;   EVALUATION UNDER ANESTHESIA WITH HEMORRHOIDECTOMY N/A 04/09/2020   Procedure: EXAM UNDER ANESTHESIA WITH HEMORRHOIDECTOMY;  Surgeon: Jules Husbands, MD;  Location: [facility] ORS;  Service: General;  Laterality: N/A;   EXCISIONAL HEMORRHOIDECTOMY  1980s   GIVENS CAPSULE STUDY N/A 12/08/2017   Procedure: GIVENS CAPSULE STUDY;  Surgeon: [name], MD;  Location: [facility] ENDOSCOPY;  Service: Gastroenterology;  Laterality: N/A;   HEMORRHOID  SURGERY N/A 10/29/2016   Procedure: HEMORRHOIDECTOMY;  Surgeon: Leighton Ruff, MD;  Location: [facility];  Service: General;  Laterality: N/A;   MITRAL VALVE ANNULOPLASTY  10/23/1998   "Model 4625; [employer] [id number]"; [facility] 63m[facility]  PACEMAKER IMPLANT N/A 07/28/2019   Procedure: PACEMAKER IMPLANT;  Surgeon: KDeboraha Sprang MD;  [place];  Service: Cardiovascular;  Laterality: N/A;   [place]  [facility]  TEE WITHOUT CARDIOVERSION N/A 06/19/2019   Procedure: TRANSESOPHAGEAL ECHOCARDIOGRAM (TEE);  Surgeon: HPixie Casino MD;  [facility];  Service: Cardiovascular;  Laterality: N/A;   TOTAL HIP ARTHROPLASTY Left 05/04/2017   Procedure: LEFT TOTAL HIP ARTHROPLASTY ANTERIOR APPROACH;  Surgeon: BMcarthur Rossetti MD;  [place]  Service: Orthopedics;  Laterality: Left;   TRANSTHORACIC ECHOCARDIOGRAM  05-01-2015   dr kCaryl Comes  ef 50-55%/  mild AV sclerosis without stenosis/  post MV repair with mild central MR (valve area by pressure  half-time 2cm^2,  valve area by continutity equation 0.91cm^2, peak grandiant 894mg)/  severe LAE/ mild TR/ mild RAE    TUBAL LIGATION Bilateral 1978   Family History  Problem Relation Age of Onset   Lung cancer Father        smoker, died at 8028 Alcohol abuse Father    Cancer Father        bladder and lung CA smoker   Sudden death Other    Breast cancer Neg Hx    Stroke Neg Hx    Social History   Socioeconomic History   Marital status: Widowed    Spouse name: Not on file   Number of children: 6   Years of education: Not on file   Highest education level: Not on file  Occupational History   Occupation: realtor    Employer: RETIRED  Tobacco Use   Smoking status: Never   Smokeless tobacco: Never  Vaping Use   Vaping Use: Never used  Substance and Sexual Activity   Alcohol use: Not Currently    Comment: seldom   Drug use: No   Sexual activity: Not Currently  Other Topics Concern   Not on file  Social History Narrative   Retired. Daily Caffeine use: 2 daily    Social Determinants of Health   Financial Resource Strain: Low Risk  (03/18/2022)   Overall Financial Resource Strain (CARDIA)    Difficulty of Paying Living Expenses: Not hard at all  Food Insecurity: No Food Insecurity (03/18/2022)   Hunger Vital Sign    Worried About Running Out of Food in the Last Year: Never true    Ran Out of Food in the Last Year: Never true  Transportation Needs: No Transportation Needs (03/18/2022)   PRAPARE - TrHydrologistMedical): No    Lack of Transportation (Non-Medical): No  Physical Activity: Inactive (03/10/2021)   Exercise Vital Sign    Days of Exercise per Week: 0 days    Minutes of Exercise per Session: 0 min  Stress: No Stress Concern Present (03/18/2022)   [place]  Feeling of Stress : Only a little  Social Connections: Moderately Integrated (03/18/2022)   Social  Connection and Isolation Panel [NHANES]    Frequency of Communication with Friends and Family: More than three times a week    Frequency  of Social Gatherings with Friends and Family: Three times a week    Attends Religious Services: More than 4 times per year    Active Member of Clubs or Organizations: Yes    Attends Archivist Meetings: More than 4 times per year    Marital Status: Widowed    Tobacco Counseling Counseling given: Not Answered   Clinical Intake:  Pre-visit preparation completed: Yes        Diabetes: No  How often do you need to have someone help you when you read instructions, pamphlets, or other written materials from your doctor or pharmacy?: 1 - Never    Interpreter Needed?: No     Activities of Daily Living    03/18/2022   11:13 AM 04/23/2021   12:21 PM  In your present state of health, do you have any difficulty performing the following activities:  Hearing? 1 0  Comment Hearing aids   Vision? 0 0  Difficulty concentrating or making decisions? 0 0  Walking or climbing stairs? 0 0  Dressing or bathing? 0 0  Doing errands, shopping? 0   Preparing Food and eating ? N   Using the Toilet? N   In the past six months, have you accidently leaked urine? Y   Comment Managed with daily pad   Do you have problems with loss of bowel control? N   Managing your Medications? N   Managing your Finances? N   Housekeeping or managing your Housekeeping? N     Patient Care Team: Tower, Wynelle Fanny, MD as PCP - General Caryl Comes Revonda Standard, MD as PCP - Cardiology (Cardiology) Charlton Haws, [facility] as Pharmacist (Pharmacist)  Indicate any recent Medical Services you may have received from other than [facility] providers in the past year (date may be approximate).     Assessment:   This is a routine wellness examination for [facility].  I connected with  [name] on 03/18/22 by a audio enabled telemedicine application and verified that I am speaking with the  correct person using two identifiers.  Patient Location: Home  Provider Location: Office/Clinic  I discussed the limitations of evaluation and management by telemedicine. The patient expressed understanding and agreed to proceed.   Hearing/Vision screen Hearing Screening - Comments:: Hearing aids, bilateral Vision Screening - Comments:: Last exam 02/2022, [place] [facility]  Dietary issues and exercise activities discussed: Current Exercise Habits: Home exercise routine, Intensity: Mild Regular diet    Goals Addressed               This Visit's Progress     Patient Stated     Increase physical activity (pt-stated)        Chair yoga Stretch class Walk more for exercise       Depression Screen    03/18/2022   11:12 AM 11/06/2021    8:25 AM 06/06/2021    9:17 AM 03/10/2021    1:59 PM 03/08/2020    2:13 PM 09/05/2018   11:26 AM 09/01/2017   10:18 AM  PHQ 2/9 Scores  PHQ - 2 Score 0 1 3 0 0 0 0  PHQ- 9 Score   10  0 0 0    Fall Risk    03/18/2022   11:11 AM 11/06/2021    8:25 AM 03/10/2021    1:57 PM 04/15/2020    3:01 PM 04/03/2020    1:43 PM  Fall Risk   Falls in the past year? 0 0 0 0 0  Number falls in past yr: 0  0 0   Injury with Fall? 0  0 0   Risk for fall due to : No Fall Risks  No Fall Risks    Follow up Falls evaluation completed;Falls prevention discussed Falls evaluation completed Falls prevention discussed      FALL RISK PREVENTION PERTAINING TO THE HOME: Home free of loose throw rugs in walkways, pet beds, electrical cords, etc? Yes  Adequate lighting in your home to reduce risk of falls? Yes   ASSISTIVE DEVICES UTILIZED TO PREVENT FALLS: Life alert? No  Use of a cane, walker or w/c? No  Grab bars in the bathroom? No  Shower chair or bench in shower? No  Comfort chair height toilet? Yes   TIMED UP AND GO: Was the test performed? No .   Cognitive Function:    12/24/2020    1:39 PM 03/08/2020    2:20 PM 09/05/2018   11:26 AM  09/01/2017   10:20 AM 08/26/2016    3:25 PM  MMSE - Mini Mental State Exam  Orientation to time '5 5 5 5 5  '$ Orientation to Place '5 5 5 5 5  '$ Registration '3 3 3 3 3  '$ Attention/ Calculation 2 5 0 0 0  Recall '3 3 3 3 2  '$ Recall-comments     pt was unable to recall 1 of 3 words  Language- name 2 objects 2  0 0 0  Language- repeat '1 1 1 1 1  '$ Language- follow 3 step command 3  0 3 3  Language- read & follow direction 1  0 0 0  Write a sentence 1  0 0 0  Copy design 1  0 0 0  Total score '27  17 20 19        '$ 03/18/2022   11:13 AM  6CIT Screen  What Year? 0 points  What month? 0 points  What time? 0 points  Count back from 20 0 points  Months in reverse 0 points  Repeat phrase 0 points  Total Score 0 points    Immunizations Immunization History  Administered Date(s) Administered   Influenza,inj,Quad PF,6+ Mos 01/19/2017   Pneumococcal Conjugate-13 08/26/2016   Pneumococcal Polysaccharide-23 09/01/2017   Td 02/03/2005   Tdap 09/30/2020   Shingrix Completed?: No.    Education has been provided regarding the importance of this vaccine. Patient has been advised to call insurance company to determine out of pocket expense if they have not yet received this vaccine. Advised may also receive vaccine at local pharmacy or Health Dept. Verbalized acceptance and understanding.  Screening Tests Health Maintenance  Topic Date Due   Zoster Vaccines- Shingrix (1 of 2) 06/17/2022 (Originally 10/27/1951)   INFLUENZA VACCINE  06/21/2022 (Originally 10/21/2021)   MAMMOGRAM  08/22/2022 (Originally 04/24/2020)   COVID-19 Vaccine (1) 12/06/2024 (Originally 10/28/1937)   Medicare Annual Wellness (AWV)  03/19/2023   DTaP/Tdap/Td (3 - Td or Tdap) 10/01/2030   Pneumonia Vaccine 7+ Years old  Completed   DEXA SCAN  Completed   HPV VACCINES  Aged Out   Health Maintenance There are no preventive care reminders to display for this patient.  Mammogram- deferred per patient preference.   Lung Cancer  Screening: (Low Dose CT Chest recommended if Age 12-80 years, 30 pack-year currently smoking OR have quit w/in 15years.) does not qualify.   Hepatitis C Screening: does not qualify.  Vision Screening: Recommended annual ophthalmology exams for early detection of glaucoma and other disorders of the  eye.  Dental Screening: Recommended annual dental exams for proper oral hygiene  Community Resource Referral / Chronic Care Management: CRR required this visit?  No   CCM required this visit?  No      Plan:     I have personally reviewed and noted the following in the patient's chart:   Medical and social history Use of alcohol, tobacco or illicit drugs  Current medications and supplements including opioid prescriptions. Patient is not currently taking opioid prescriptions. Functional ability and status Nutritional status Physical activity Advanced directives List of other physicians Hospitalizations, surgeries, and ER visits in previous 12 months Vitals Screenings to include cognitive, depression, and falls Referrals and appointments  In addition, I have reviewed and discussed with patient certain preventive protocols, quality metrics, and best practice recommendations. A written personalized care plan for preventive services as well as general preventive health recommendations were provided to patient.     Leta Jungling, LPN   [id number]/05/[id number]

## 2022-03-18 NOTE — Patient Instructions (Addendum)
[name] , Thank you for taking time to come for your Medicare Wellness Visit. I appreciate your ongoing commitment to your health goals. Please review the following plan we discussed and let me know if I can assist you in the future.   These are the goals we discussed:  Goals Addressed               This Visit's Progress     Patient Stated     Increase physical activity (pt-stated)        Chair yoga Stretch class Walk more for exercise         This is a list of the screening recommended for you and due dates:  Health Maintenance  Topic Date Due   Zoster (Shingles) Vaccine (1 of 2) 06/17/2022*   Flu Shot  06/21/2022*   Mammogram  08/22/2022*   COVID-19 Vaccine (1) 12/06/2024*   Medicare Annual Wellness Visit  03/19/2023   DTaP/Tdap/Td vaccine (3 - Td or Tdap) 10/01/2030   Pneumonia Vaccine  Completed   DEXA scan (bone density measurement)  Completed   HPV Vaccine  Aged Out  *Topic was postponed. The date shown is not the original due date.   Conditions/risks identified: none new.  Next appointment: Follow up in one year for your annual wellness visit    Preventive Care 65 Years and Older, Female Preventive care refers to lifestyle choices and visits with your health care provider that can promote health and wellness. What does preventive care include? A yearly physical exam. This is also called an annual well check. Dental exams once or twice a year. Routine eye exams. Ask your health care provider how often you should have your eyes checked. Personal lifestyle choices, including: Daily care of your teeth and gums. Regular physical activity. Eating a healthy diet. Avoiding tobacco and drug use. Limiting alcohol use. Practicing safe sex. Taking low-dose aspirin every day. Taking vitamin and mineral supplements as recommended by your health care provider. What happens during an annual well check? The services and screenings done by your health care provider  during your annual well check will depend on your age, overall health, lifestyle risk factors, and family history of disease. Counseling  Your health care provider may ask you questions about your: Alcohol use. Tobacco use. Drug use. Emotional well-being. Home and relationship well-being. Sexual activity. Eating habits. History of falls. Memory and ability to understand (cognition). Work and work Statistician. Reproductive health. Screening  You may have the following tests or measurements: Height, weight, and BMI. Blood pressure. Lipid and cholesterol levels. These may be checked every 5 years, or more frequently if you are over 80 years old. Skin check. Lung cancer screening. You may have this screening every year starting at age 74 if you have a 30-pack-year history of smoking and currently smoke or have quit within the past 15 years. Fecal occult blood test (FOBT) of the stool. You may have this test every year starting at age 91. Flexible sigmoidoscopy or colonoscopy. You may have a sigmoidoscopy every 5 years or a colonoscopy every 10 years starting at age 37. Hepatitis C blood test. Hepatitis B blood test. Sexually transmitted disease (STD) testing. Diabetes screening. This is done by checking your blood sugar (glucose) after you have not eaten for a while (fasting). You may have this done every 1-3 years. Bone density scan. This is done to screen for osteoporosis. You may have this done starting at age 66. Mammogram. This may be done  every 1-2 years. Talk to your health care provider about how often you should have regular mammograms. Talk with your health care provider about your test results, treatment options, and if necessary, the need for more tests. Vaccines  Your health care provider may recommend certain vaccines, such as: Influenza vaccine. This is recommended every year. Tetanus, diphtheria, and acellular pertussis (Tdap, Td) vaccine. You may need a Td booster every  10 years. Zoster vaccine. You may need this after age 24. Pneumococcal 13-valent conjugate (PCV13) vaccine. One dose is recommended after age 66. Pneumococcal polysaccharide (PPSV23) vaccine. One dose is recommended after age 7. Talk to your health care provider about which screenings and vaccines you need and how often you need them. This information is not intended to replace advice given to you by your health care provider. Make sure you discuss any questions you have with your health care provider. Document Released: 04/05/2015 Document Revised: 11/27/2015 Document Reviewed: 01/08/2015 Elsevier Interactive Patient Education  2017 [place] Prevention in the Home Falls can cause injuries. They can happen to people of all ages. There are many things you can do to make your home safe and to help prevent falls. What can I do on the outside of my home? Regularly fix the edges of walkways and driveways and fix any cracks. Remove anything that might make you trip as you walk through a door, such as a raised step or threshold. Trim any bushes or trees on the path to your home. Use bright outdoor lighting. Clear any walking paths of anything that might make someone trip, such as rocks or tools. Regularly check to see if handrails are loose or broken. Make sure that both sides of any steps have handrails. Any raised decks and porches should have guardrails on the edges. Have any leaves, snow, or ice cleared regularly. Use sand or salt on walking paths during winter. Clean up any spills in your garage right away. This includes oil or grease spills. What can I do in the bathroom? Use night lights. Install grab bars by the toilet and in the tub and shower. Do not use towel bars as grab bars. Use non-skid mats or decals in the tub or shower. If you need to sit down in the shower, use a plastic, non-slip stool. Keep the floor dry. Clean up any water that spills on the floor as soon as it  happens. Remove soap buildup in the tub or shower regularly. Attach bath mats securely with double-sided non-slip rug tape. Do not have throw rugs and other things on the floor that can make you trip. What can I do in the bedroom? Use night lights. Make sure that you have a light by your bed that is easy to reach. Do not use any sheets or blankets that are too big for your bed. They should not hang down onto the floor. Have a firm chair that has side arms. You can use this for support while you get dressed. Do not have throw rugs and other things on the floor that can make you trip. What can I do in the kitchen? Clean up any spills right away. Avoid walking on wet floors. Keep items that you use a lot in easy-to-reach places. If you need to reach something above you, use a strong step stool that has a grab bar. Keep electrical cords out of the way. Do not use floor polish or wax that makes floors slippery. If you must use wax, use  non-skid floor wax. Do not have throw rugs and other things on the floor that can make you trip. What can I do with my stairs? Do not leave any items on the stairs. Make sure that there are handrails on both sides of the stairs and use them. Fix handrails that are broken or loose. Make sure that handrails are as long as the stairways. Check any carpeting to make sure that it is firmly attached to the stairs. Fix any carpet that is loose or worn. Avoid having throw rugs at the top or bottom of the stairs. If you do have throw rugs, attach them to the floor with carpet tape. Make sure that you have a light switch at the top of the stairs and the bottom of the stairs. If you do not have them, ask someone to add them for you. What else can I do to help prevent falls? Wear shoes that: Do not have high heels. Have rubber bottoms. Are comfortable and fit you well. Are closed at the toe. Do not wear sandals. If you use a stepladder: Make sure that it is fully opened.  Do not climb a closed stepladder. Make sure that both sides of the stepladder are locked into place. Ask someone to hold it for you, if possible. Clearly mark and make sure that you can see: Any grab bars or handrails. First and last steps. Where the edge of each step is. Use tools that help you move around (mobility aids) if they are needed. These include: Canes. Walkers. Scooters. Crutches. Turn on the lights when you go into a dark area. Replace any light bulbs as soon as they burn out. Set up your furniture so you have a clear path. Avoid moving your furniture around. If any of your floors are uneven, fix them. If there are any pets around you, be aware of where they are. Review your medicines with your doctor. Some medicines can make you feel dizzy. This can increase your chance of falling. Ask your doctor what other things that you can do to help prevent falls. This information is not intended to replace advice given to you by your health care provider. Make sure you discuss any questions you have with your health care provider. Document Released: 01/03/2009 Document Revised: 08/15/2015 Document Reviewed: 04/13/2014 Elsevier Interactive Patient Education  2017 [employer].

## 2022-03-19 ENCOUNTER — Ambulatory Visit (INDEPENDENT_AMBULATORY_CARE_PROVIDER_SITE_OTHER): Payer: Medicare Other | Admitting: Family Medicine

## 2022-03-19 ENCOUNTER — Encounter: Payer: Self-pay | Admitting: Family Medicine

## 2022-03-19 VITALS — BP 135/70 | HR 84 | Temp 97.2°F | Ht 64.0 in | Wt 167.0 lb

## 2022-03-19 DIAGNOSIS — M8589 Other specified disorders of bone density and structure, multiple sites: Secondary | ICD-10-CM | POA: Diagnosis not present

## 2022-03-19 DIAGNOSIS — Z1231 Encounter for screening mammogram for malignant neoplasm of breast: Secondary | ICD-10-CM | POA: Insufficient documentation

## 2022-03-19 DIAGNOSIS — R202 Paresthesia of skin: Secondary | ICD-10-CM | POA: Diagnosis not present

## 2022-03-19 DIAGNOSIS — I482 Chronic atrial fibrillation, unspecified: Secondary | ICD-10-CM | POA: Diagnosis not present

## 2022-03-19 DIAGNOSIS — E2839 Other primary ovarian failure: Secondary | ICD-10-CM | POA: Diagnosis not present

## 2022-03-19 DIAGNOSIS — E7849 Other hyperlipidemia: Secondary | ICD-10-CM

## 2022-03-19 DIAGNOSIS — D126 Benign neoplasm of colon, unspecified: Secondary | ICD-10-CM

## 2022-03-19 DIAGNOSIS — F418 Other specified anxiety disorders: Secondary | ICD-10-CM

## 2022-03-19 DIAGNOSIS — R7309 Other abnormal glucose: Secondary | ICD-10-CM | POA: Diagnosis not present

## 2022-03-19 NOTE — Assessment & Plan Note (Signed)
Suspect trigeminal neuralgia She has seen neuro   Bio freeze may help /using vics now Voltaren gel is another topical opt

## 2022-03-19 NOTE — Assessment & Plan Note (Signed)
Colonoscopy 01/2020 For 3 y recall if well enough

## 2022-03-19 NOTE — Assessment & Plan Note (Signed)
Pt thinks she is doing a bit better  Reviewed stressors/ coping techniques/symptoms/ support sources/ tx options and side effects in detail today

## 2022-03-19 NOTE — Assessment & Plan Note (Signed)
Lab Results  Component Value Date   HGBA1C 6.0 03/12/2022   disc imp of low glycemic diet and wt loss to prevent DM2

## 2022-03-19 NOTE — Progress Notes (Signed)
Subjective:    Patient ID: [name], female    DOB: [birth year], 88 y.o.   MRN: [id number]  HPI Pt presents for annual f/u of chronic health problems   Wt Readings from Last 3 Encounters:  03/19/22 167 lb (75.8 kg)  03/18/22 170 lb (77.1 kg)  03/02/22 170 lb (77.1 kg)   28.67 kg/m  She was seen in ER and tx for pna with levaquin on 12/11 Is better  Still has some post nasal drip but antihistamines make her too sleepy   More stamina recently , feeling some better    Immunization History  Administered Date(s) Administered   Influenza,inj,Quad PF,6+ Mos 01/19/2017   Pneumococcal Conjugate-13 08/26/2016   Pneumococcal Polysaccharide-23 09/01/2017   Td 02/03/2005   Tdap 09/30/2020   There are no preventive care reminders to display for this patient.   Declines shingrix vaccine Declines flu vaccine   Mammogram 04/2019 - would like to order  Self breast exam  Dexa  04/2019, osteopenia slt improved  Alendronate since 10/2016  Falls: none  Fractures: none  Supplements : taking D  Exercise : not much / is busy and on her feet  More lifting   Dermatology care - up to date    Cologuard neg 2019  Colonoscopy 01/2020 noted polyps and 3 y recall recommended   Still has some shooting pain/tingling in R face  Worse when stress and sometimes when talking  ? Trigeminal neuralgia  Uses vics vapor rub to soothe it   H/o a fib and CHF Still taking warfarin  Lab Results  Component Value Date   INR 2.1 03/12/2022   INR 2.1 (H) 03/02/2022   INR 3.2 (A) 02/26/2022   BP Readings from Last 3 Encounters:  03/19/22 135/70  03/02/22 134/66  02/27/22 (!) 146/82    Pulse Readings from Last 3 Encounters:  03/19/22 84  03/02/22 71  02/27/22 71   Per pt pretty stable    Hypothyroidism  Pt has no clinical changes No change in energy level/ hair or skin/ edema and no tremor Lab Results  Component Value Date   TSH 4.94 03/12/2022    Levothyroxine 75 mcg daily     H/o iron def Lab Results  Component Value Date   WBC 5.2 03/12/2022   HGB 12.4 03/12/2022   HCT 37.7 03/12/2022   MCV 86.8 03/12/2022   PLT 218.0 03/12/2022   Lab Results  Component Value Date   IRON 66 03/12/2022   TIBC 350 12/28/2018   FERRITIN 171.1 03/12/2022     Taking ferrous sulfate  Hyperlipidemia Lab Results  Component Value Date   CHOL 194 03/12/2022   CHOL 165 12/20/2020   CHOL 178 03/04/2020   Lab Results  Component Value Date   HDL 34.80 (L) 03/12/2022   HDL 46.30 12/20/2020   HDL 37.80 (L) 03/04/2020   Lab Results  Component Value Date   LDLCALC 134 (H) 03/12/2022   LDLCALC 105 (H) 12/20/2020   LDLCALC 125 (H) 03/04/2020   Lab Results  Component Value Date   TRIG 125.0 03/12/2022   TRIG 71.0 12/20/2020   TRIG 76.0 03/04/2020   Lab Results  Component Value Date   CHOLHDL 6 03/12/2022   CHOLHDL 4 12/20/2020   CHOLHDL 5 03/04/2020   Lab Results  Component Value Date   LDLDIRECT 203.8 01/09/2008  She is statin allergic Stopped zetia due to side eff(? Not sure what)   Eats lean meats Salads  Putting some carbs  back in diet  Avoids bread     Glucose  Lab Results  Component Value Date   HGBA1C 6.0 03/12/2022   Does fairly well with her carb intake  Lots of veggies  Occ sweet potatoes   Patient Active Problem List   Diagnosis Date Noted   Encounter for screening mammogram for breast cancer 03/19/2022   PND (post-nasal drip) 12/11/2021   Other fatigue 10/08/2021   Brain fog 01/14/2021   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/20/2020   Post-menopausal bleeding 11/20/2020   Facial paresthesia 10/14/2020   Grief reaction 09/20/2020   Hearing loss 06/13/2020   Atrial fibrillation ([place]) 04/24/2020   Routine general medical examination at a health care facility 03/11/2020   Elevated glucose 03/03/2020   History of TIA (transient ischemic attack) 02/18/2020   S/P AV nodal ablation 07/28/19 07/29/2019   S/P placement of cardiac pacemaker  MDT 07/28/19 07/29/2019   AV block 07/28/2019   History of CVA (cerebrovascular accident) 06/16/2019   Facial tingling 11/29/2018   Tremor of left hand 11/29/2018   Medicare annual wellness visit, subsequent 11/24/2018   Dysuria 02/06/2018   Dizzy 02/04/2018   Iron deficiency anemia 10/21/2017   Rapid atrial fibrillation ([place]) 08/19/2017   Constipation 08/02/2017   Unilateral primary osteoarthritis, left hip 05/04/2017   Status post total replacement of left hip 05/04/2017   Hip osteoarthritis 04/27/2017   Long term (current) use of anticoagulants 03/04/2017   Venous stasis dermatitis of both lower extremities 01/08/2017   Osteopenia 10/25/2016   Pedal edema 08/26/2016   Varicose veins of both lower extremities 08/26/2016   Estrogen deficiency 08/26/2016   Screening mammogram, encounter for 08/26/2016   Hemorrhoids 08/26/2016   History of nonmelanoma skin cancer 01/01/2016   Pruritus 10/04/2015   Urticaria 08/22/2014   Hip pain 08/02/2014   Left knee pain 08/02/2014   Chronic cough 05/08/2014   Hematochezia 04/09/2014   Colon cancer screening 08/16/2013   Fatigue 08/16/2013   Encounter for therapeutic drug monitoring 04/20/2013   Left ovarian cyst 03/14/2013   (HFpEF) heart failure with preserved ejection fraction ([place]) 12/27/2012   COLONIC POLYPS, ADENOMATOUS, HX OF 09/18/2009   PULMONARY NODULE 12/20/2008   GANGLION CYST 10/04/2007   Mixed incontinence 04/28/2007   Hyperlipidemia 04/27/2007   Depression with anxiety 04/27/2007   Asthma, mild intermittent 04/27/2007   INSOMNIA 04/27/2007   ADENOMATOUS COLONIC POLYP 11/04/2006   Hypothyroidism 09/02/2006   Atrial fibrillation, chronic ([place]) 08/05/2006   Past Medical History:  Diagnosis Date   Allergic rhinitis    Alopecia 2/2 beta blockers    Anemia    Arthritis    Atrial fibrillation -persistent cardiologist-  dr klein/  primary EP -- dr Tawanna Sat ([facility])   a. s/p PVI [name] 2010;  b. on tikosyn/coumadin;  c.  05/2009 Echo: EF 60-65%, Gr 2 DD. (first dx 09/ 2007)   Bilateral lower extremity edema    Bleeding hemorrhoid    Carotid stenosis    mild (hosp 3/11)- consult by vasc/ Dr Donnetta Hutching   Complication of anesthesia    hard to wake   Diverticulosis of colon    Dyspnea    on exertion-climbing stairs   Fatty liver    H/O cardiac radiofrequency ablation    01/ 2008 at [place] /  03/ 2010  at [facility] failure with preserved ejection fraction [facility])    History of adenomatous polyp of colon    tubular adenoma's   History of cardiomyopathy  secondary tachycardia-induced cardiomyopathy -- resolved 2014   History of squamous cell carcinoma in situ (SCCIS) of skin    05/ 2017  nasal bridge and right medial knee   History of transient ischemic attack (TIA)    01-24-2005 and 06-12-2009   Hyperlipidemia    Hypothyroidism    Mild intermittent asthma    reacts to cats   Mixed stress and urge urinary incontinence    Presence of permanent cardiac pacemaker    was put in 07/2019   Pulmonary nodule    S/P AV nodal ablation 07/28/19 07/29/2019   S/P mitral valve repair 10-23-1998  dr Boyce Medici at [facility]   for MVP and regurg. (annuloplasty ring procedure)   S/P placement of cardiac pacemaker MDT 07/28/19 07/29/2019   Past Surgical History:  Procedure Laterality Date   APPENDECTOMY  1978   AV NODE ABLATION N/A 07/28/2019   Procedure: AV NODE ABLATION;  Surgeon: Deboraha Sprang, MD;  Location: [place] CV LAB;  Service: Cardiovascular;  Laterality: N/A;   BUBBLE STUDY  06/19/2019   Procedure: BUBBLE STUDY;  Surgeon: Pixie Casino, MD;  Location: [facility] ENDOSCOPY;  Service: Cardiovascular;;   CARDIAC ELECTROPHYSIOLOGY [place]  01/ 2008    at [place]   right-sided ablation atrial flutter   [place]  03/ 2010   dr Jaymes Graff at [facility]   AV node ablation and pulmonary vein isolation for atrial fib   CARDIOVERSION  06-18-2006;   07-13-2006;  10-19-2010;  10-27-2010   CATARACT EXTRACTION W/PHACO Right 04/09/2021   Procedure: CATARACT EXTRACTION PHACO AND INTRAOCULAR LENS PLACEMENT ([place]) RIGHT 6.71 01:11.1;  Surgeon: Leandrew Koyanagi, MD;  Location: [place];  Service: Ophthalmology;  Laterality: Right;   CATARACT EXTRACTION W/PHACO Left 04/23/2021   Procedure: CATARACT EXTRACTION PHACO AND INTRAOCULAR LENS PLACEMENT ([place]) LEFT;  Surgeon: Leandrew Koyanagi, MD;  Location: [place];  Service: Ophthalmology;  Laterality: Left;  [place] 5.65 00:50.1   COLONOSCOPY     COLONOSCOPY WITH PROPOFOL N/A 10/13/2017   Procedure: COLONOSCOPY WITH PROPOFOL;  Surgeon: [name], MD;  Location: [facility] ENDOSCOPY;  Service: Gastroenterology;  Laterality: N/A;   COLONOSCOPY WITH PROPOFOL N/A 02/20/2020   Procedure: COLONOSCOPY WITH PROPOFOL;  Surgeon: Lesly Rubenstein, MD;  Location: [facility] ENDOSCOPY;  Service: Endoscopy;  Laterality: N/A;   CYSTO/ TRANSURETHRAL COLLAGEN INJECTION THERAPY  07-26-2007   dr Matilde Sprang   DILATION AND CURETTAGE OF UTERUS     ESOPHAGOGASTRODUODENOSCOPY (EGD) WITH PROPOFOL N/A 10/13/2017   Procedure: ESOPHAGOGASTRODUODENOSCOPY (EGD) WITH PROPOFOL;  Surgeon: [name], MD;  Location: [facility] ENDOSCOPY;  Service: Gastroenterology;  Laterality: N/A;   EVALUATION UNDER ANESTHESIA WITH HEMORRHOIDECTOMY N/A 04/09/2020   Procedure: EXAM UNDER ANESTHESIA WITH HEMORRHOIDECTOMY;  Surgeon: Jules Husbands, MD;  Location: [facility] ORS;  Service: General;  Laterality: N/A;   EXCISIONAL HEMORRHOIDECTOMY  1980s   GIVENS CAPSULE STUDY N/A 12/08/2017   Procedure: GIVENS CAPSULE STUDY;  Surgeon: [name], MD;  Location: [facility] ENDOSCOPY;  Service: Gastroenterology;  Laterality: N/A;   HEMORRHOID SURGERY N/A 10/29/2016   Procedure: HEMORRHOIDECTOMY;  Surgeon: Leighton Ruff, MD;  Location: [facility];  Service: General;  Laterality: N/A;   MITRAL VALVE ANNULOPLASTY  10/23/1998   "Model 4625;  Campbell Lerner 607371"; [facility] 82m[facility]  PACEMAKER IMPLANT N/A 07/28/2019   Procedure: PACEMAKER IMPLANT;  Surgeon: KDeboraha Sprang MD;  [facility];  Service: Cardiovascular;  Laterality: N/A;   PILONIDAL CYST  EXCISION  1954   TEE WITH CARDIOVERSION  05-06-2006 at [facility];  01-02-2013 at [facility]   TEE WITHOUT CARDIOVERSION N/A 06/19/2019   Procedure: TRANSESOPHAGEAL ECHOCARDIOGRAM (TEE);  Surgeon: Pixie Casino, MD;  Location: [facility] ENDOSCOPY;  Service: Cardiovascular;  Laterality: N/A;   TOTAL HIP ARTHROPLASTY Left 05/04/2017   Procedure: LEFT TOTAL HIP ARTHROPLASTY ANTERIOR APPROACH;  Surgeon: Mcarthur Rossetti, MD;  Location: [place];  Service: Orthopedics;  Laterality: Left;   TRANSTHORACIC ECHOCARDIOGRAM  05-01-2015   dr Caryl Comes   ef 50-55%/  mild AV sclerosis without stenosis/  post MV repair with mild central MR (valve area by pressure half-time 2cm^2,  valve area by continutity equation 0.91cm^2, peak grandiant 12mHg)/  severe LAE/ mild TR/ mild RAE    TUBAL LIGATION Bilateral 1978   Social History   Tobacco Use   Smoking status: Never   Smokeless tobacco: Never  Vaping Use   Vaping Use: Never used  Substance Use Topics   Alcohol use: Not Currently    Comment: seldom   Drug use: No   Family History  Problem Relation Age of Onset   Lung cancer Father        smoker, died at 863  Alcohol abuse Father    Cancer Father        bladder and lung CA smoker   Sudden death Other    Breast cancer Neg Hx    Stroke Neg Hx    Allergies  Allergen Reactions   Amiodarone Swelling    SWELLING REACTION UNSPECIFIED    Penicillins Hives and Rash    Has patient had a PCN reaction causing immediate rash, facial/tongue/throat swelling, SOB or lightheadedness with hypotension: No Has patient had a PCN reaction causing severe rash involving mucus membranes or skin necrosis: No Has patient had a PCN reaction that required hospitalization:Patient was inpatient when  reaction occurred Has patient had a PCN reaction occurring within the last 10 years: No If all of the above answers are "NO", then may proceed with Cephalosporin use   Amiodarone Hcl Swelling    SWELLING REACTION UNSPECIFIED    Statins Rash    REACTION: rash   Current Outpatient Medications on File Prior to Visit  Medication Sig Dispense Refill   acetaminophen (TYLENOL) 500 MG tablet Take 500 mg by mouth every 6 (six) hours as needed for moderate pain.     albuterol (VENTOLIN HFA) 108 (90 Base) MCG/ACT inhaler Inhale into the lungs every 6 (six) hours as needed for wheezing or shortness of breath.     diphenhydrAMINE-zinc acetate (BENADRYL) cream Apply 1 application topically 3 (three) times daily as needed for itching.     ferrous sulfate 325 (65 FE) MG EC tablet TAKE 1 TABLET EVERY DAY WITH BREAKFAST 90 tablet 1   furosemide (LASIX) 40 MG tablet Take 40 mg by mouth daily as needed for edema.     hydrOXYzine (ATARAX/VISTARIL) 25 MG tablet Take 1 tablet (25 mg total) by mouth every 6 (six) hours as needed for itching. 30 tablet 0   levothyroxine (SYNTHROID) 75 MCG tablet TAKE 1 TABLET EVERY DAY BEFORE BREAKFAST 90 tablet 1   Polyethyl Glycol-Propyl Glycol 0.4-0.3 % SOLN Place 1-2 drops into both eyes 3 (three) times daily as needed (for dry eyes.).     warfarin (COUMADIN) 5 MG tablet TAKE 1 TABLET DAILY EXCEPT TAKE 1 AND 1/2 TABLETS ON WEDNESDAYS OR AS DIRECTED BY COUMADIN CLINIC 100 tablet 1   No current facility-administered medications on file  prior to visit.      Review of Systems  Constitutional:  Positive for fatigue. Negative for activity change, appetite change, fever and unexpected weight change.  HENT:  Negative for congestion, ear pain, rhinorrhea, sinus pressure and sore throat.   Eyes:  Negative for pain, redness and visual disturbance.  Respiratory:  Negative for cough, shortness of breath and wheezing.   Cardiovascular:  Negative for chest pain and palpitations.   Gastrointestinal:  Negative for abdominal pain, blood in stool, constipation and diarrhea.  Endocrine: Negative for polydipsia and polyuria.  Genitourinary:  Negative for dysuria, frequency and urgency.  Musculoskeletal:  Positive for arthralgias. Negative for back pain and myalgias.  Skin:  Negative for pallor and rash.  Allergic/Immunologic: Negative for environmental allergies.  Neurological:  Negative for dizziness, syncope and headaches.       Tingling/ pain in R face  Hematological:  Negative for adenopathy. Does not bruise/bleed easily.  Psychiatric/Behavioral:  Negative for decreased concentration and dysphoric mood. The patient is not nervous/anxious.        Objective:   Physical Exam Constitutional:      General: She is not in acute distress.    Appearance: Normal appearance. She is well-developed and normal weight. She is not ill-appearing or diaphoretic.  HENT:     Head: Normocephalic and atraumatic.     Right Ear: Tympanic membrane, ear canal and external ear normal.     Left Ear: Tympanic membrane, ear canal and external ear normal.     Nose: Nose normal. No congestion.     Mouth/Throat:     Mouth: Mucous membranes are moist.     Pharynx: Oropharynx is clear. No posterior oropharyngeal erythema.  Eyes:     General: No scleral icterus.    Extraocular Movements: Extraocular movements intact.     Conjunctiva/sclera: Conjunctivae normal.     Pupils: Pupils are equal, round, and reactive to light.  Neck:     Thyroid: No thyromegaly.     Vascular: No carotid bruit or JVD.  Cardiovascular:     Rate and Rhythm: Normal rate and regular rhythm.     Pulses: Normal pulses.     Heart sounds: Normal heart sounds.     No gallop.  Pulmonary:     Effort: Pulmonary effort is normal. No respiratory distress.     Breath sounds: Normal breath sounds. No wheezing.     Comments: Good air exch Chest:     Chest wall: No tenderness.  Abdominal:     General: Bowel sounds are  normal. There is no distension or abdominal bruit.     Palpations: Abdomen is soft. There is no mass.     Tenderness: There is no abdominal tenderness.     Hernia: No hernia is present.  Genitourinary:    Comments: Breast exam: No mass, nodules, thickening, tenderness, bulging, retraction, inflamation, nipple discharge or skin changes noted.  No axillary or clavicular LA.     Musculoskeletal:        General: No tenderness. Normal range of motion.     Cervical back: Normal range of motion and neck supple. No rigidity. No muscular tenderness.     Right lower leg: No edema.     Left lower leg: No edema.     Comments: No kyphosis   Lymphadenopathy:     Cervical: No cervical adenopathy.  Skin:    General: Skin is warm and dry.     Coloration: Skin is not pale.  Findings: No erythema or rash.     Comments: Solar lentigines diffusely   Neurological:     Mental Status: She is alert. Mental status is at baseline.     Cranial Nerves: No cranial nerve deficit.     Motor: No abnormal muscle tone.     Coordination: Coordination normal.     Gait: Gait normal.     Deep Tendon Reflexes: Reflexes are normal and symmetric. Reflexes normal.  Psychiatric:        Attention and Perception: Attention normal.        Mood and Affect: Mood normal.        Cognition and Memory: Cognition and memory normal.     Comments: Good mood today           Assessment & Plan:   Problem List Items Addressed This Visit       Cardiovascular and Mediastinum   Atrial fibrillation, chronic (HCC)    Continues warfarin and cardiology f/u        Digestive   ADENOMATOUS COLONIC POLYP    Colonoscopy 01/2020 For 3 y recall if well enough        Musculoskeletal and Integument   Osteopenia - Primary    Dexa ordered Completed 5 y of alendronate , will take drug holiday No falls or fx Taking vit D Disc strength building exercise         Other   Depression with anxiety    Pt thinks she is doing a bit  better  Reviewed stressors/ coping techniques/symptoms/ support sources/ tx options and side effects in detail today       Elevated glucose    Lab Results  Component Value Date   HGBA1C 6.0 03/12/2022  disc imp of low glycemic diet and wt loss to prevent DM2       Encounter for screening mammogram for breast cancer    Mammogram ordered Pt will call to schedule       Relevant Orders   MM 3D SCREEN BREAST BILATERAL   Estrogen deficiency   Relevant Orders   DG Bone Density   Facial tingling    Suspect trigeminal neuralgia She has seen neuro   Bio freeze may help /using vics now Voltaren gel is another topical opt      Hyperlipidemia

## 2022-03-19 NOTE — Assessment & Plan Note (Signed)
Continues warfarin and cardiology f/u

## 2022-03-19 NOTE — Patient Instructions (Addendum)
To prevent diabetes Try to get most of your carbohydrates from produce (with the exception of white potatoes)  Eat less bread/pasta/rice/snack foods/cereals/sweets and other items from the middle of the grocery store (processed carbs)    Weight bearing and strength training exercise is very important for your bone health   You are finished with 5 years of fosamax (alendronate) so please stop it  We may go back to it in a few more years   Call [place] to schedule your mammogram and bone density tests   Please call the location of your choice from the menu below to schedule your Mammogram and/or Bone Density appointment.    [facility]                      Phone:  [phone], [place] [address]                                                             Services: Traditional and 3D Mammogram, [place] Bone Density                 Phone: [phone] [address], [place] [address]    Service: Bone Density ONLY   *this site does NOT perform mammograms  [facility]                        Phone:  [phone] [address], [place] [address]                                            Services:  3D Mammogram and [place] at [facility]   Phone:  [phone]   [place], [place] [address]  Services: 3D Mammogram and Bone Density  [place] at [facility])  Phone:  [phone]   [address] [place], [place] [address]                                               Services:  3D Mammogram and Bone Density

## 2022-03-19 NOTE — Assessment & Plan Note (Signed)
Mammogram ordered °Pt will call to schedule  °

## 2022-03-19 NOTE — Assessment & Plan Note (Signed)
Dexa ordered Completed 5 y of alendronate , will take drug holiday No falls or fx Taking vit D Disc strength building exercise

## 2022-03-26 ENCOUNTER — Ambulatory Visit: Payer: Medicare Other

## 2022-03-31 ENCOUNTER — Ambulatory Visit (INDEPENDENT_AMBULATORY_CARE_PROVIDER_SITE_OTHER): Payer: Medicare Other | Admitting: Family Medicine

## 2022-03-31 ENCOUNTER — Telehealth: Payer: Self-pay

## 2022-03-31 ENCOUNTER — Encounter: Payer: Self-pay | Admitting: Family Medicine

## 2022-03-31 VITALS — BP 132/64 | HR 84 | Temp 97.9°F | Ht 64.0 in | Wt 171.0 lb

## 2022-03-31 DIAGNOSIS — R21 Rash and other nonspecific skin eruption: Secondary | ICD-10-CM | POA: Diagnosis not present

## 2022-03-31 DIAGNOSIS — R202 Paresthesia of skin: Secondary | ICD-10-CM

## 2022-03-31 MED ORDER — TRIAMCINOLONE ACETONIDE 0.5 % EX CREA: 1.0000 | TOPICAL_CREAM | Freq: Two times a day (BID) | CUTANEOUS | 1 refills | Status: DC | PRN

## 2022-03-31 NOTE — Progress Notes (Unsigned)
Patient has itchy/burning rash on legs. Patient states it started months ago on ankle but has recently started to go up legs. States she itches all over but has no rash other then legs. No new meds.  No trigger. No fevers.  No rash on the feet.    Recent CBC:  CBC    Component Value Date/Time   WBC 5.2 03/12/2022 0729   RBC 4.34 03/12/2022 0729   HGB 12.4 03/12/2022 0729   HGB 12.8 07/25/2019 1203   HCT 37.7 03/12/2022 0729   HCT 38.7 07/25/2019 1203   PLT 218.0 03/12/2022 0729   PLT 192 07/25/2019 1203   MCV 86.8 03/12/2022 0729   MCV 85 07/25/2019 1203   MCV 86 06/26/2013 2220   MCH 28.6 03/02/2022 1155   MCHC 32.9 03/12/2022 0729   RDW 15.3 03/12/2022 0729   RDW 15.7 (H) 07/25/2019 1203   RDW 14.6 (H) 06/26/2013 2220   LYMPHSABS 1.6 03/12/2022 0729   LYMPHSABS 1.5 05/24/2017 1613   MONOABS 0.4 03/12/2022 0729   EOSABS 0.1 03/12/2022 0729   EOSABS 0.1 05/24/2017 1613   BASOSABS 0.0 03/12/2022 0729   BASOSABS 0.1 05/24/2017 1613    Drowsy when using hydroxyzine.  That limited use for itching.  Rash predates shingrix vaccine.    She has some R sided facial sensitivity/shocking pain, doesn't cross midline.  That is an old issue per patient report.  Meds, vitals, and allergies reviewed.   ROS: Per HPI unless specifically indicated in ROS section   Nad ncat IRR Ctab Blanching red punctate lesions on the B shins with B ankle varicose veins noted.  No ulceration.   Skin hypersensitive on the right side of the face without rash.

## 2022-03-31 NOTE — Telephone Encounter (Signed)
[place] [facility] - Client TELEPHONE ADVICE RECORD AccessNurse Patient Name: [name] Gender: Female DOB: 09-Aug-1932 Age: 89 Y 27 M 2 D Return Phone Number: [phone] (Primary), [phone] (Secondary) Address: [place]  [address] Client [facility] - Client Client Site Boundary Provider Glori Bickers, Roque Lias - MD Contact Type Call Who Is Calling Patient / Member / Family / Caregiver Call Type Triage / Clinical Relationship To Patient Self Return Phone Number [phone] (Primary) Chief Complaint Bruise Reason for Call Symptomatic / Request for Health Information Initial Comment Caller was transferred from the office. Patient is experiencing bruises as she has been itching, patient has an appt for today at 2 PM. Translation No No Triage Reason Other Nurse Assessment Nurse: [place], RN, Estill Bamberg Date/Time (Eastern Time): 03/31/2022 1:52:40 PM Confirm and document reason for call. If symptomatic, describe symptoms. ---caller states she got a red rash. is spreading, itching, irritating. putting ice on it. nurse saw her legs last week and said she needed to see her doctor. says this has been going on for months. red streaks going up her legs. has appointment at 2pm. is now at the doctor office for her appointment. Does the patient have any new or worsening symptoms? ---Yes Will a triage be completed? ---No [facility] reason for no triage. ---Other Disp. Time Eilene Ghazi Time) Disposition Final User 03/31/2022 1:55:36 PM Clinical Call Yes [place], RN, Estill Bamberg Final Disposition 03/31/2022 1:55:36 PM Clinical Call Yes [place], RN, Wilmer Floor

## 2022-03-31 NOTE — Telephone Encounter (Signed)
Patient was scheduled for today at 2:00 pm with Dr. Damita Dunnings

## 2022-04-01 DIAGNOSIS — R21 Rash and other nonspecific skin eruption: Secondary | ICD-10-CM | POA: Insufficient documentation

## 2022-04-01 NOTE — Assessment & Plan Note (Signed)
This looks like a benign but annoying inflammatory condition and would be reasonable to use triamcinolone cream.  She can update [name] if she does not improve with that.  She agrees to plan.

## 2022-04-01 NOTE — Telephone Encounter (Signed)
See office visit note.  Thanks.

## 2022-04-01 NOTE — Assessment & Plan Note (Signed)
This could be a variant of trigeminal neuralgia and I need input from PCP.

## 2022-04-09 ENCOUNTER — Ambulatory Visit (INDEPENDENT_AMBULATORY_CARE_PROVIDER_SITE_OTHER): Payer: Medicare Other

## 2022-04-09 DIAGNOSIS — Z7901 Long term (current) use of anticoagulants: Secondary | ICD-10-CM

## 2022-04-09 LAB — POCT INR: INR: 3 (ref 2.0–3.0)

## 2022-04-09 NOTE — Progress Notes (Signed)
Continue 1 tablet daily. Recheck in 4 weeks.   

## 2022-04-09 NOTE — Patient Instructions (Addendum)
Pre visit review using our clinic review tool, if applicable. No additional management support is needed unless otherwise documented below in the visit note.  Continue 1 tablet daily . Recheck in 4 weeks.  

## 2022-04-18 NOTE — Progress Notes (Unsigned)
Cardiology Office Note Date:  04/18/2022  Patient ID:  [name], Key 06/11/1932, MRN [id number] PCP:  Abner Greenspan, MD  Cardiologist:  Dr. Caryl Comes   Chief Complaint:   *** planned f/u  History of Present Illness: [name] is a 89 y.o. female with history of permanent AFib, hypothyroidism, asthma, HLD, TIA, CVA, AFlutter w/ ablation related to prior atriotomy undertaken at [facility] in 06-07-2008, VHD s/p MV repair June 08, 2018), remote NICM with recovered LVEF, permanent Afib, >> Avnode ablation and PPM  She saw Dr. Caryl Comes 02/27/22, reported feeling tired, getting winding with only slight incline, as well as some degree of lassitude (?depression, mentioning she had in 06/07/2020, found her son dead in the home).  She was volume OL, lasix pulsed to daily for 5 days then to QOD, thoughts towards [place] and or spiro.  ER visit 03/02/22, SOB, CXR with perhaps PNA, treated with nebulizer and prescribed levaquin  She has seen primary team a couple times since, unrelated to SOB, but routine/chronic issues  *** symptoms, volume *** bleeding, warfarin, monitoring? 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 s/p PVI [name] 2010;  b. on tikosyn/coumadin;  c. 2009/06/07 Echo: EF 60-65%, Gr 2 DD. (first dx 09/ 2005/06/07)   Bilateral lower extremity edema    Bleeding hemorrhoid    Carotid stenosis    mild (hosp 3/11)- consult by vasc/ Dr Donnetta Hutching   Complication of anesthesia    hard to wake   Diverticulosis of colon    Dyspnea    on  exertion-climbing stairs   Fatty liver    H/O cardiac radiofrequency ablation    01/ June 08, 2006 at [facility] /  03/ 06/07/08  at [facility] failure with preserved ejection fraction [facility])    History of adenomatous polyp of colon    tubular adenoma's   History of cardiomyopathy    secondary tachycardia-induced cardiomyopathy -- resolved 06-07-2012   History of squamous cell carcinoma in situ (SCCIS) of skin    05/ 2017  nasal bridge and right medial knee   History of transient ischemic attack (TIA)    01-24-2005 and 06-12-2009   Hyperlipidemia    Hypothyroidism    Mild intermittent asthma    reacts to cats   Mixed stress and urge urinary incontinence    Presence of permanent cardiac pacemaker    was put in 07/2019   Pulmonary nodule    S/P AV nodal ablation 07/28/19 07/29/2019   S/P mitral valve repair 10-23-1998  dr Boyce Medici at [facility]   for MVP and regurg. (annuloplasty ring procedure)   S/P placement of cardiac pacemaker MDT 07/28/19 07/29/2019    Past Surgical History:  Procedure Laterality Date   APPENDECTOMY  1978   AV NODE ABLATION N/A 07/28/2019   Procedure: AV NODE ABLATION;  Surgeon: Deboraha Sprang, MD;  Location: [place] CV LAB;  Service: Cardiovascular;  Laterality: N/A;  BUBBLE STUDY  06/19/2019   Procedure: BUBBLE STUDY;  Surgeon: Pixie Casino, MD;  Location: [facility] ENDOSCOPY;  Service: Cardiovascular;;   CARDIAC ELECTROPHYSIOLOGY [place]  01/ 2008    at [place]   right-sided ablation atrial flutter   [place]  03/ 2010   dr Jaymes Graff at [facility]   AV node ablation and pulmonary vein isolation for atrial fib   CARDIOVERSION  06-18-2006;  07-13-2006;  10-19-2010;  10-27-2010   CATARACT EXTRACTION W/PHACO Right 04/09/2021   Procedure: CATARACT EXTRACTION PHACO AND INTRAOCULAR LENS PLACEMENT (IOC) RIGHT 6.71 01:11.1;  Surgeon: Leandrew Koyanagi, MD;  Location: [place];  Service: Ophthalmology;   Laterality: Right;   CATARACT EXTRACTION W/PHACO Left 04/23/2021   Procedure: CATARACT EXTRACTION PHACO AND INTRAOCULAR LENS PLACEMENT ([place]) LEFT;  Surgeon: Leandrew Koyanagi, MD;  Location: [place];  Service: Ophthalmology;  Laterality: Left;  [place] 5.65 00:50.1   COLONOSCOPY     COLONOSCOPY WITH PROPOFOL N/A 10/13/2017   Procedure: COLONOSCOPY WITH PROPOFOL;  Surgeon: [name], MD;  Location: [facility] ENDOSCOPY;  Service: Gastroenterology;  Laterality: N/A;   COLONOSCOPY WITH PROPOFOL N/A 02/20/2020   Procedure: COLONOSCOPY WITH PROPOFOL;  Surgeon: Lesly Rubenstein, MD;  Location: [facility] ENDOSCOPY;  Service: Endoscopy;  Laterality: N/A;   CYSTO/ TRANSURETHRAL COLLAGEN INJECTION THERAPY  07-26-2007   dr Matilde Sprang   DILATION AND CURETTAGE OF UTERUS     ESOPHAGOGASTRODUODENOSCOPY (EGD) WITH PROPOFOL N/A 10/13/2017   Procedure: ESOPHAGOGASTRODUODENOSCOPY (EGD) WITH PROPOFOL;  Surgeon: [name], MD;  Location: [facility] ENDOSCOPY;  Service: Gastroenterology;  Laterality: N/A;   EVALUATION UNDER ANESTHESIA WITH HEMORRHOIDECTOMY N/A 04/09/2020   Procedure: EXAM UNDER ANESTHESIA WITH HEMORRHOIDECTOMY;  Surgeon: Jules Husbands, MD;  Location: [facility] ORS;  Service: General;  Laterality: N/A;   EXCISIONAL HEMORRHOIDECTOMY  1980s   GIVENS CAPSULE STUDY N/A 12/08/2017   Procedure: GIVENS CAPSULE STUDY;  Surgeon: [name], MD;  Location[facility] END[facility]COPY;  Service: Gastroenterology;  Laterality: N/A;   HEMORRHOID SURGERY N/A 10/29/2016   Procedure: HEMORRHOIDECTOMY;  Surgeon: Leighton Ruff, MD;  Location: [facility];  Service: General;  Laterality: N/A;   MITRAL VALVE ANNULOPLASTY  10/23/1998   "Model [id number]; Campbell Lerner [id number]"; [facility] 20m[facility]  PACEMAKER IMPLANT N/A 07/28/2019   Procedure: PACEMAKER IMPLANT;  Surgeon: KDeboraha Sprang MD;  [place];  Service: Cardiovascular;  Laterality: N/A;   [place]  [facility]  [facility]  TEE WITHOUT CARDIOVERSION N/A 06/19/2019   Procedure: TRANSESOPHAGEAL ECHOCARDIOGRAM (TEE);  Surgeon: HPixie Casino MD;  [facility];  Service: Cardiovascular;  Laterality: N/A;   TOTAL HIP ARTHROPLASTY Left 05/04/2017   Procedure: LEFT TOTAL HIP ARTHROPLASTY ANTERIOR APPROACH;  Surgeon: BMcarthur Rossetti MD;  [place]  Service: Orthopedics;  Laterality: Left;   TRANSTHORACIC ECHOCARDIOGRAM  05-01-2015   dr kCaryl Comes  ef 50-55%/  mild AV sclerosis without stenosis/  post MV repair with mild central MR (valve area by pressure half-time 2cm^2,  valve area by continutity equation 0.91cm^2, peak grandiant 844mg)/  severe LAE/ mild TR/ mild RAE    TUBAL LIGATION Bilateral 1978    Current Outpatient Medications  Medication Sig Dispense Refill   acetaminophen (TYLENOL) 500 MG tablet Take 500 mg by mouth every 6 (six) hours as needed for moderate pain.     albuterol (VENTOLIN HFA) 108 (90 Base) MCG/ACT  inhaler Inhale into the lungs every 6 (six) hours as needed for wheezing or shortness of breath.     diphenhydrAMINE-zinc acetate (BENADRYL) cream Apply 1 application topically 3 (three) times daily as needed for itching.     ferrous sulfate 325 (65 FE) MG EC tablet TAKE 1 TABLET EVERY DAY WITH BREAKFAST 90 tablet 1   furosemide (LASIX) 40 MG tablet Take 40 mg by mouth daily as needed for edema.     hydrOXYzine (ATARAX/VISTARIL) 25 MG tablet Take 1 tablet (25 mg total) by mouth every 6 (six) hours as needed for itching. 30 tablet 0   levothyroxine (SYNTHROID) 75 MCG tablet TAKE 1 TABLET EVERY DAY BEFORE BREAKFAST 90 tablet 1   Polyethyl Glycol-Propyl Glycol 0.4-0.3 % SOLN Place 1-2 drops into both eyes 3 (three) times daily as needed (for dry eyes.).     triamcinolone cream (KENALOG) 0.5 % Apply 1 Application topically 2 (two) times daily as needed. 60 g 1   warfarin (COUMADIN) 5 MG tablet TAKE 1 TABLET DAILY EXCEPT TAKE 1  AND 1/2 TABLETS ON WEDNESDAYS OR AS DIRECTED BY COUMADIN CLINIC 100 tablet 1   No current facility-administered medications for this visit.    Allergies:   Amiodarone, Penicillins, and Statins   Social History:  The patient  reports that she has never smoked. She has never used smokeless tobacco. She reports that she does not currently use alcohol. She reports that she does not use drugs.   Family History:  The patient's family history includes Alcohol abuse in her father; Cancer in her father; Lung cancer in her father; Sudden death in an other family member.  ROS:  Please see the history of present illness.  All other systems are reviewed and otherwise negative.   PHYSICAL EXAM:  VS:  There were no vitals taken for this visit. BMI: There is no height or weight on file to calculate BMI. Well nourished, well developed, in no acute distress  HEENT: normocephalic, atraumatic  Neck: no JVD, carotid bruits or masses Cardiac:  *** RRR; no significant murmurs, no rubs, or gallops Lungs:  *** CTA b/l, no wheezing, rhonchi or rales  Abd: soft, nontender MS: no deformity or atrophy Ext: *** several superficial spider veins, trace edema  Skin: warm and dry, no rash Neuro:  No gross deficits appreciated Psych: euthymic mood, full affect  *** PPM site: stable, no tethering, skin changes   EKG:  Not done today  PPM interrogation done today and reviewed by myself ***  05/01/15: TTE Study Conclusions - Left ventricle: Septal and apical hypokinesis The cavity size was   normal. Wall thickness was normal. Systolic function was normal.   The estimated ejection fraction was in the range of 50% to 55%. - Mitral valve: Post mitral valve repair with mild central MR. - Left atrium: The atrium was severely dilated. - Right atrium: The atrium was mildly dilated. - Atrial septum: No defect or patent foramen ovale was identified.   06/19/2019: TEE IMPRESSIONS   1. Left ventricular ejection  fraction, by estimation, is 55 to 60%. The  left ventricle has normal function. The left ventricle has no regional  wall motion abnormalities. There is mild left ventricular hypertrophy.   2. Right ventricular systolic function is normal. The right ventricular  size is normal.   3. Left atrial size was severely dilated. No left atrial/left atrial  appendage thrombus was detected.   4. The mitral valve has been repaired/replaced. Mild to moderate mitral  valve  regurgitation. Mild mitral stenosis. There is a prosthetic  annuloplasty ring present in the mitral position. Procedure Date:  10/23/1998.   5. The tricuspid valve is abnormal. Tricuspid valve regurgitation is  moderate.   6. The aortic valve is tricuspid. Aortic valve regurgitation is not  visualized.   7. Late microbubble contrast suggestive of intrahepatic or intrapulmonary  shunt.     06/16/2019: TTE IMPRESSIONS  1. Left ventricular ejection fraction, by estimation, is 55 to 60%. The  left ventricle has normal function. The left ventricle has no regional  wall motion abnormalities. Left ventricular diastolic function could not  be evaluated.   2. Right ventricular systolic function is normal. The right ventricular  size is normal. There is mildly elevated pulmonary artery systolic  pressure. The estimated right ventricular systolic pressure is 09.3 mmHg.   3. Left atrial size was severely dilated.   4. A mitral valve annuloplasty ring is present. There is a heavily  calcified focal mass-like structure present on the PMVL. This is likely  deterioration of the PMVL with mitral annular calcification. There are no  mobile components to it. There is no  apparent destruction of the valve. There is mild to moderate MR. There is  mild mitral stenosis. This is best viewed in the PLAX. When compared with  the prior echo this was present but not specifically commented on. The  mitral valve has been  repaired/replaced. Mild to  moderate mitral valve regurgitation. The mean  mitral valve gradient is 6.3 mmHg with average heart rate of 80 bpm. There  is a prosthetic annuloplasty ring present in the mitral position.  Procedure Date: 10/23/1998.   5. The aortic valve is tricuspid. Aortic valve regurgitation is not  visualized. Mild aortic valve sclerosis is present, with no evidence of  aortic valve stenosis.   6. The inferior vena cava is dilated in size with <50% respiratory  variability, suggesting right atrial pressure of 15 mmHg.   Comparison(s): A prior study was performed on 12/01/2016. No significant  change from prior study.     Recent Labs: 10/08/2021: Magnesium 2.2 03/02/2022: B Natriuretic Peptide 197.7 03/12/2022: ALT 16; BUN 25; Creatinine, Ser 0.78; Hemoglobin 12.4; Platelets 218.0; Potassium 4.8; Sodium 142; TSH 4.94  03/12/2022: Cholesterol 194; HDL 34.80; LDL Cholesterol 134; Total CHOL/HDL Ratio 6; Triglycerides 125.0; VLDL 25.0   CrCl cannot be calculated (Patient's most recent lab result is older than the maximum 21 days allowed.).   Wt Readings from Last 3 Encounters:  03/31/22 171 lb (77.6 kg)  03/19/22 167 lb (75.8 kg)  03/18/22 170 lb (77.1 kg)     Other studies reviewed: Additional studies/records reviewed today include: summarized above  ASSESSMENT AND PLAN:  1. Permanent AFib > AV node ablation     CHA2DS2Vasc is 6, on warfarin, *** monitored and managed here  2. PPM s/p AVNode ablation     *** Intact function     *** No programming changes made     *** Dependent at 40bpm today  3. HTN     *** No changes today  4. Volume OL *** Update echo  Disposition: ***   Current medicines are reviewed at length with the patient today.  The patient did not have any concerns regarding medicines.  Haywood Lasso, PA-C 04/18/2022 10:03 AM     [facility] [place] [address] [phone] (office)  [phone] (fax)

## 2022-04-20 ENCOUNTER — Encounter: Payer: Self-pay | Admitting: Physician Assistant

## 2022-04-20 ENCOUNTER — Ambulatory Visit: Payer: Medicare Other | Attending: Physician Assistant | Admitting: Physician Assistant

## 2022-04-20 VITALS — BP 156/80 | HR 80 | Ht 65.0 in | Wt 173.0 lb

## 2022-04-20 DIAGNOSIS — I10 Essential (primary) hypertension: Secondary | ICD-10-CM | POA: Diagnosis not present

## 2022-04-20 DIAGNOSIS — Z950 Presence of cardiac pacemaker: Secondary | ICD-10-CM | POA: Diagnosis not present

## 2022-04-20 DIAGNOSIS — I4821 Permanent atrial fibrillation: Secondary | ICD-10-CM | POA: Diagnosis not present

## 2022-04-20 LAB — CUP PACEART INCLINIC DEVICE CHECK
Battery Remaining Longevity: 105 mo
Battery Voltage: 3.02 V
Brady Statistic AP VP Percent: 0 %
Brady Statistic AP VS Percent: 0 %
Brady Statistic AS VP Percent: 94.15 %
Brady Statistic AS VS Percent: 5.85 %
Brady Statistic RA Percent Paced: 0 %
Brady Statistic RV Percent Paced: 94.15 %
Date Time Interrogation Session: 20240129132159
Implantable Lead Connection Status: 753985
Implantable Lead Implant Date: 20210507
Implantable Lead Location: 753860
Implantable Lead Model: 5076
Implantable Pulse Generator Implant Date: 20210507
Lead Channel Impedance Value: 3306 Ohm
Lead Channel Impedance Value: 3306 Ohm
Lead Channel Impedance Value: 361 Ohm
Lead Channel Impedance Value: 437 Ohm
Lead Channel Pacing Threshold Amplitude: 0.625 V
Lead Channel Pacing Threshold Pulse Width: 0.4 ms
Lead Channel Sensing Intrinsic Amplitude: 1.875 mV
Lead Channel Sensing Intrinsic Amplitude: 9.125 mV
Lead Channel Sensing Intrinsic Amplitude: 9.125 mV
Lead Channel Setting Pacing Amplitude: 2.5 V
Lead Channel Setting Pacing Pulse Width: 0.4 ms
Lead Channel Setting Sensing Sensitivity: 4 mV
Zone Setting Status: 755011

## 2022-04-20 NOTE — Patient Instructions (Signed)
Medication Instructions:   Your physician recommends that you continue on your current medications as directed. Please refer to the Current Medication list given to you today.   *If you need a refill on your cardiac medications before your next appointment, please call your pharmacy*   Lab Work: [place]    If you have labs (blood work) drawn today and your tests are completely normal, you will receive your results only by: [place] (if you have MyChart) OR A paper copy in the mail If you have any lab test that is abnormal or we need to change your treatment, we will call you to review the results.   Testing/Procedures: NONE ORDERED  TODAY     Follow-Up: At [facility], you and your health needs are our priority.  As part of our continuing mission to provide you with exceptional heart care, we have created designated Provider Care Teams.  These Care Teams include your primary Cardiologist (physician) and Advanced Practice Providers (APPs -  Physician Assistants and Nurse Practitioners) who all work together to provide you with the care you need, when you need it.  We recommend signing up for the patient portal called "MyChart".  Sign up information is provided on this After Visit Summary.  MyChart is used to connect with patients for Virtual Visits (Telemedicine).  Patients are able to view lab/test results, encounter notes, upcoming appointments, etc.  Non-urgent messages can be sent to your provider as well.   To learn more about what you can do with MyChart, go to [email or web address].    Your next appointment:   3 -4 month(s)  Provider:   Tommye Standard, PA-C    Other Instructions

## 2022-04-24 ENCOUNTER — Ambulatory Visit: Payer: Medicare Other

## 2022-04-24 DIAGNOSIS — I4430 Unspecified atrioventricular block: Secondary | ICD-10-CM

## 2022-04-24 LAB — CUP PACEART REMOTE DEVICE CHECK
Battery Remaining Longevity: 103 mo
Battery Voltage: 3.02 V
Brady Statistic AP VP Percent: 0 %
Brady Statistic AP VS Percent: 0 %
Brady Statistic AS VP Percent: 95.06 %
Brady Statistic AS VS Percent: 4.94 %
Brady Statistic RA Percent Paced: 0 %
Brady Statistic RV Percent Paced: 95.06 %
Date Time Interrogation Session: 20240202005218
Implantable Lead Connection Status: 753985
Implantable Lead Implant Date: 20210507
Implantable Lead Location: 753860
Implantable Lead Model: 5076
Implantable Pulse Generator Implant Date: 20210507
Lead Channel Impedance Value: 3306 Ohm
Lead Channel Impedance Value: 3306 Ohm
Lead Channel Impedance Value: 380 Ohm
Lead Channel Impedance Value: 418 Ohm
Lead Channel Pacing Threshold Amplitude: 0.625 V
Lead Channel Pacing Threshold Pulse Width: 0.4 ms
Lead Channel Sensing Intrinsic Amplitude: 1.875 mV
Lead Channel Sensing Intrinsic Amplitude: 13 mV
Lead Channel Sensing Intrinsic Amplitude: 13 mV
Lead Channel Setting Pacing Amplitude: 2.5 V
Lead Channel Setting Pacing Pulse Width: 0.4 ms
Lead Channel Setting Sensing Sensitivity: 4 mV
Zone Setting Status: 755011

## 2022-05-07 ENCOUNTER — Ambulatory Visit (INDEPENDENT_AMBULATORY_CARE_PROVIDER_SITE_OTHER): Payer: Medicare Other

## 2022-05-07 DIAGNOSIS — Z7901 Long term (current) use of anticoagulants: Secondary | ICD-10-CM

## 2022-05-07 LAB — POCT INR: INR: 2.7 (ref 2.0–3.0)

## 2022-05-07 NOTE — Patient Instructions (Addendum)
Pre visit review using our clinic review tool, if applicable. No additional management support is needed unless otherwise documented below in the visit note.  Continue 1 tablet daily . Recheck in 4 weeks.

## 2022-05-07 NOTE — Progress Notes (Signed)
Continue 1 tablet daily . Recheck in 4 weeks.

## 2022-05-09 ENCOUNTER — Other Ambulatory Visit: Payer: Self-pay | Admitting: Family Medicine

## 2022-05-12 NOTE — Progress Notes (Signed)
Remote pacemaker transmission.   

## 2022-05-22 ENCOUNTER — Telehealth: Payer: Self-pay

## 2022-05-22 NOTE — Progress Notes (Signed)
Care Management & Coordination Services Pharmacy Team  Reason for Encounter: Appointment Reminder  Contacted patient to confirm telephone appointment with [name], PharmD on 05/27/2022 at 11:00.  Spoke with patient on 05/22/2022   Do you have any problems getting your medications? No  What is your top health concern you would like to discuss at your upcoming visit? Patient stated she does not have anything, but allergies! She is pretty well off she states.   Have you seen any other providers since your last visit with PCP? Yes  Star Rating Drugs:  Medication:  Last Fill: Day Supply No star rating drugs noted  Care Gaps: Annual wellness visit in last year? Yes 03/18/2022  [name], PharmD notified  [name], [place] Pharmacy Assistant [phone]

## 2022-05-27 ENCOUNTER — Ambulatory Visit: Payer: Medicare Other | Admitting: Pharmacist

## 2022-05-27 ENCOUNTER — Telehealth: Payer: Self-pay | Admitting: Pharmacist

## 2022-05-27 MED ORDER — FLUTICASONE PROPIONATE 50 MCG/ACT NA SUSP: 1.0000 | Freq: Two times a day (BID) | NASAL | 11 refills | Status: AC | PRN

## 2022-05-27 NOTE — Telephone Encounter (Signed)
Patient reports post-nasal drip, she requests prescription for Flonase be sent to her mail order pharmacy if possible:  [address] [place] [address] Phone: [phone] Fax: [phone]

## 2022-05-27 NOTE — Progress Notes (Signed)
Care Management & Coordination Services Pharmacy Note  05/27/2022 Name:  [name] MRN:  [id number] DOB:  1932/08/22  Summary: F/U visit -Pt reports persistent nasal congestion and post-nasal drip leading to bothersome throat-clearing/cough. She is not taking cetirizine or nasal spray on a regular basis -Osteopenia: completed 5 years of fosamax, DEXA scan was ordered but pt has not scheduled this  Recommendations/Changes made from today's visit: -Advised to use Flonase 1-2 times daily until sx improve; if no improvement contact us -Gave phone number for [facility], advised pt to schedule DEXA  Follow up plan: -Health Concierge will call patient 6 months for general update -Pharmacist follow up PRN -Cardiology appt 07/27/22   Subjective: [name] is an 89 y.o. year old female who is a primary patient of Tower, Wynelle Fanny, MD.  The care coordination team was consulted for assistance with disease management and care coordination needs.    Engaged with patient by telephone for follow up visit.  Recent office visits: 03/31/22 Dr Damita Dunnings OV: rash - rx triamcolone cream. 03/19/22 Dr Glori Bickers OV: Annual - d/c alendronate (5 years); DEXA ordered. 12/11/21 Dr Glori Bickers OV: black stools - no blood in stool.  11/04/21 Dr Glori Bickers OV: Afib - warfarin control difficult. Hx side effects with Eliquis, not willing to try other DOAC but will d/w cardiology.  06/25/21 Dr Glori Bickers OV: salt craving - NA, cortisol levels normal.  Recent consult visits: 04/20/22 PA Charlcie Cradle (Cardiology): f/u - BP 156/80, recheck 140/84. No changes.  02/27/22 Dr Caryl Comes (Cardiology): f/u - permanent afib. Continue warfarin. Volume overoad - increase lasix to daily x 5 days then QOD. Recheck BMP 2 week.s  Hospital visits: 03/02/22 ED visit [facility]): CAP - rx levofloxacin  10/08/21 ED visit [facility]): palpitations  Objective:  Lab Results  Component Value Date   CREATININE 0.78 03/12/2022   BUN 25 (H) 03/12/2022   GFR 68.32  03/12/2022   GFRNONAA >60 03/02/2022   GFRAA 92 05/13/2020   NA 142 03/12/2022   K 4.8 03/12/2022   CALCIUM 8.8 03/12/2022   CO2 29 03/12/2022   GLUCOSE 91 03/12/2022    Lab Results  Component Value Date/Time   HGBA1C 6.0 03/12/2022 07:29 AM   HGBA1C 5.8 06/25/2021 11:14 AM   GFR 68.32 03/12/2022 07:29 AM   GFR 71.70 01/01/2022 08:15 AM    Last diabetic Eye exam: No results found for: "HMDIABEYEEXA"  Last diabetic Foot exam: No results found for: "HMDIABFOOTEX"   Lab Results  Component Value Date   CHOL 194 03/12/2022   HDL 34.80 (L) 03/12/2022   LDLCALC 134 (H) 03/12/2022   LDLDIRECT 203.8 01/09/2008   TRIG 125.0 03/12/2022   CHOLHDL 6 03/12/2022       Latest Ref Rng & Units 03/12/2022    7:29 AM 01/01/2022    8:15 AM 03/02/2021    2:41 PM  Hepatic Function  Total Protein 6.0 - 8.3 g/dL 6.5  6.6  7.4   Albumin 3.5 - 5.2 g/dL 3.7  3.9  3.9   AST 0 - 37 U/L '18  18  24   '$ ALT 0 - 35 U/L '16  12  18   '$ Alk Phosphatase 39 - 117 U/L 49  36  42   Total Bilirubin 0.2 - 1.2 mg/dL 0.4  0.7  0.7     Lab Results  Component Value Date/Time   TSH 4.94 03/12/2022 07:29 AM   TSH 3.89 01/01/2022 08:15 AM   FREET4 0.90 09/14/2013 08:33 AM  Latest Ref Rng & Units 03/12/2022    7:29 AM 03/02/2022   11:55 AM 01/01/2022    8:15 AM  CBC  WBC 4.0 - 10.5 K/uL 5.2  8.7  3.7   Hemoglobin 12.0 - 15.0 g/dL 12.4  11.6  13.0   Hematocrit 36.0 - 46.0 % 37.7  36.0  39.3   Platelets 150.0 - 400.0 K/uL 218.0  147  132.0     Lab Results  Component Value Date/Time   VITAMINB12 448 11/06/2020 05:32 AM   VITAMINB12 670 02/14/2018 01:28 PM    Clinical ASCVD: Yes  The ASCVD Risk score (Arnett DK, et al., 2019) failed to calculate for the following reasons:   The 2019 ASCVD risk score is only valid for ages 43 to 79   The patient has a prior MI or stroke diagnosis        03/18/2022   11:12 AM 11/06/2021    8:25 AM 06/06/2021    9:17 AM  Depression screen PHQ 2/9  Decreased  Interest 0 0 2  Down, Depressed, Hopeless 0 1 1  PHQ - 2 Score 0 1 3  Altered sleeping   1  Tired, decreased energy   1  Change in appetite   0  Feeling bad or failure about yourself    2  Trouble concentrating   3  Moving slowly or fidgety/restless   0  Suicidal thoughts   0  PHQ-9 Score   10  Difficult doing work/chores   Somewhat difficult     Social History   Tobacco Use  Smoking Status Never  Smokeless Tobacco Never   BP Readings from Last 3 Encounters:  04/20/22 (!) 156/80  03/31/22 132/64  03/19/22 135/70   Pulse Readings from Last 3 Encounters:  04/20/22 80  03/31/22 84  03/19/22 84   Wt Readings from Last 3 Encounters:  04/20/22 173 lb (78.5 kg)  03/31/22 171 lb (77.6 kg)  03/19/22 167 lb (75.8 kg)   BMI Readings from Last 3 Encounters:  04/20/22 28.79 kg/m  03/31/22 29.35 kg/m  03/19/22 28.67 kg/m    Allergies  Allergen Reactions   Amiodarone Swelling    SWELLING REACTION UNSPECIFIED    Penicillins Hives and Rash    Has patient had a PCN reaction causing immediate rash, facial/tongue/throat swelling, SOB or lightheadedness with hypotension: No Has patient had a PCN reaction causing severe rash involving mucus membranes or skin necrosis: No Has patient had a PCN reaction that required hospitalization:Patient was inpatient when reaction occurred Has patient had a PCN reaction occurring within the last 10 years: No If all of the above answers are "NO", then may proceed with Cephalosporin use   Statins Rash    REACTION: rash    Medications Reviewed Today     Reviewed by Charlton Haws, [facility] (Pharmacist) on 05/27/22 at 1134  Med List Status: <None>   Medication Order Taking? Sig Documenting Provider Last Dose Status Informant  acetaminophen (TYLENOL) 500 MG tablet [id number] Yes Take 500 mg by mouth every 6 (six) hours as needed for moderate pain. [provider] Taking Active   albuterol (VENTOLIN HFA) 108 (90 Base) MCG/ACT inhaler  [id number] Yes Inhale into the lungs every 6 (six) hours as needed for wheezing or shortness of breath. [provider] Taking Active   diphenhydrAMINE-zinc acetate (BENADRYL) cream [id number] Yes Apply 1 application topically 3 (three) times daily as needed for itching. [provider] Taking Active Self  ferrous sulfate 325 (65 FE)  MG EC tablet [id number] Yes TAKE 1 TABLET EVERY DAY WITH BREAKFAST Tower, Wynelle Fanny, MD Taking Active   fluticasone (FLONASE) 50 MCG/ACT nasal spray [id number]  Place 1 spray into both nostrils 2 (two) times daily as needed for allergies or rhinitis. [provider]  Active   furosemide (LASIX) 40 MG tablet [id number] Yes Take 40 mg by mouth daily as needed for edema. [provider] Taking Active   hydrOXYzine (ATARAX/VISTARIL) 25 MG tablet [id number] Yes Take 1 tablet (25 mg total) by mouth every 6 (six) hours as needed for itching. Kennith Gain, MD Taking Active   levothyroxine (SYNTHROID) 75 MCG tablet [id number] Yes TAKE 1 TABLET EVERY DAY BEFORE BREAKFAST Tower, Wynelle Fanny, MD Taking Active   Polyethyl Glycol-Propyl Glycol 0.4-0.3 % SOLN [id number] Yes Place 1-2 drops into both eyes 3 (three) times daily as needed (for dry eyes.). [provider] Taking Active Self  triamcinolone cream (KENALOG) 0.5 % [id number] Yes Apply 1 Application topically 2 (two) times daily as needed. Tonia Ghent, MD Taking Active   warfarin (COUMADIN) 5 MG tablet [id number] Yes TAKE 1 TABLET DAILY EXCEPT TAKE 1 AND 1/2 TABLETS ON WEDNESDAYS OR AS DIRECTED BY COUMADIN CLINIC Tower, Wynelle Fanny, MD Taking Active             SDOH:  (Social Determinants of Health) assessments and interventions performed: No SDOH Interventions    Flowsheet Row Clinical Support from 03/18/2022 in [place] at [place] Management from 06/06/2021 in [place] at [place] from 03/08/2020 in  [place] at [place] from 09/05/2018 in [place] at [place] Interventions Intervention Not Indicated -- -- --  Housing Interventions Intervention Not Indicated -- -- --  Transportation Interventions Intervention Not Indicated Intervention Not Indicated -- --  Utilities Interventions Intervention Not Indicated -- -- --  Depression Interventions/Treatment  -- Counseling PHQ2-9 Score <4 Follow-up Not Indicated PHQ2-9 Score <4 Follow-up Not Indicated  Financial Strain Interventions Intervention Not Indicated -- -- --  Stress Interventions Intervention Not Indicated -- -- --  Social Connections Interventions Intervention Not Indicated -- -- --       Medication Assistance: None required.  Patient affirms current coverage meets needs.  Medication Access: Within the past 30 days, how often has patient missed a dose of medication? 0 Is a pillbox or other method used to improve adherence? Yes  Factors that may affect medication adherence? no barriers identified Are meds synced by current pharmacy? No  Are meds delivered by current pharmacy? Yes  Does patient experience delays in picking up medications due to transportation concerns? No   [facility] Services Reviewed: Is patient disadvantaged to use [facility]?: Yes  Current Rx insurance plan: [employer] Name and location of Current pharmacy:  [id number] [place]Phone: [phone]: [phone] [address]Phone: [phone]: [phone] [facility] services reviewed with patient today?: No  Patient requests to transfer care to [facility]?: No  Reason patient declined to change pharmacies: Disadvantaged due to insurance/mail  order  Compliance/Adherence/Medication fill history: Care Gaps: None  Star-Rating Drugs: None   ASSESSMENT / PLAN  Hyperlipidemia: (LDL goal < 70) -Not ideally controlled - LDL  is above goal, pt did not tolerate statins or ezetimibe -Hx stroke 04/2019. Candidate for PCSK9 per cardiology. 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 -Warfarin per PCP clinic -Current treatment: Warfarin 5 mg AD - Appropriate, Effective, Safe, Accessible -Medications previously tried: diltiazem, digoxin, dofetilide, Eliquis, amiodarone -Counseled on increased risk of stroke due to Afib and benefits of anticoagulation for stroke prevention; -Recommended to continue current medication  Asthma (Goal: control symptoms and prevent exacerbations) -Controlled - pt rarely need albuterol -Current treatment  Albuterol HFA prn - Appropriate, Effective, Safe, Accessible -Medications previously tried: n/a  -Pulmonary function testing: not on file -Exacerbations requiring treatment in last 6 months: 0 -Frequency of rescue inhaler use: rare -Counseled on When to use rescue inhaler -Recommended to continue current medication  Allergic rhinitis (Goal: manage symptoms) -Not ideally controlled - pt reports persistent nasal congestion that is worse in the morning, and frequent throat-clearing that is bothersome, she believes this is related to post-nasal  drip; she reports allergy medications make her very drowsy and she likes to avoid them if she can -Uses Neti-pot -Current treatment  Cetirizine 10 mg PRN - Appropriate, Effective, Safe, Accessible Albuterol HFA prn - Appropriate, Effective, Safe, Accessible -Medications previously tried: -Counseled on benefits of steroid nasal spray for decongestation and post-nasal drip  -Recommended daily use of steroid nasal spray, can take cetirizine at night when symptoms are severe  Depression/Anxiety (Goal: manage symptoms) -Stable - pt previously followed with LCSW for counseling -Loss of her son September 07, 2020 (found dead in his room); also lost her daughter and husband suddenly in the past, which has led to anxiety about her remaining children/family dying suddenly; she does not want to take antidepressants -PHQ9: 0 (02/2021) - no/minimal depression -GAD7: not on file -Connected with PCP for mental health support -Current treatment: none  Osteopenia (Goal prevent fractures) -Controlled - pt recently completed 5 years of fosamax treatment, she is due for DEXA scan which was ordered by PCP -Last DEXA Scan: 04/25/2019 (improved from previous 2018); recheck 2 years  T-Score femoral neck: -1.6  T-Score lumbar spine: -1.1 -Current treatment  Vitamin D 1000 IU daily -Medications previously tried: alendronate 2019 - 2024  -Recommended to continue current medication; advised to schedule DEXA scan -gave phone number for [place]  Hypothyroidism (Goal: maintain TSH in goal range) -Controlled - TSH 3.06; pt is taking med first thing in AM -Current treatment  Levothyroxine 75 mcg daily - Appropriate, Effective, Safe, Accessible -Medications previously tried: n/a  -Recommended to continue current medication  Health Maintenance -Vaccine gaps: Shingrix   Charlene Brooke, PharmD, BCACP Clinical Pharmacist [facility] at [facility] [phone]

## 2022-05-27 NOTE — Patient Instructions (Signed)
Visit Information  Phone number for Pharmacist: [phone]  Thank you for meeting with me to discuss your medications! Below is a summary of what we talked about during the visit:   Recommendations/Changes made from today's visit: -Advised to use Flonase 1-2 times daily until sx improve; if no improvement contact us -Gave phone number for [facility], advised pt to schedule DEXA  Follow up plan: -Health Concierge will call patient 6 months for general update -Pharmacist follow up PRN -Cardiology appt 07/27/22   Charlene Brooke, PharmD, BCACP Clinical Pharmacist [facility] at [facility] [phone]

## 2022-06-04 ENCOUNTER — Ambulatory Visit (INDEPENDENT_AMBULATORY_CARE_PROVIDER_SITE_OTHER): Payer: Medicare Other

## 2022-06-04 DIAGNOSIS — Z7901 Long term (current) use of anticoagulants: Secondary | ICD-10-CM | POA: Diagnosis not present

## 2022-06-04 LAB — POCT INR: INR: 2.9 (ref 2.0–3.0)

## 2022-06-04 NOTE — Progress Notes (Signed)
Pt reported some intermittent R foot (sole of foot) numbness. Advised pt should have apt with PCP to address concern. Pt did not want to schedule at this time. Advised pt if any changes to contact office to schedule an apt. Pt verbalized understanding.  Continue 1 tablet daily . Recheck in 4 weeks.

## 2022-06-04 NOTE — Telephone Encounter (Signed)
A user error has taken place: encounter opened in error, closed for administrative reasons.

## 2022-06-04 NOTE — Patient Instructions (Addendum)
Pre visit review using our clinic review tool, if applicable. No additional management support is needed unless otherwise documented below in the visit note. Continue 1 tablet daily.  Re-check in 4 weeks.   

## 2022-07-02 ENCOUNTER — Ambulatory Visit (INDEPENDENT_AMBULATORY_CARE_PROVIDER_SITE_OTHER): Payer: Medicare Other

## 2022-07-02 DIAGNOSIS — Z7901 Long term (current) use of anticoagulants: Secondary | ICD-10-CM | POA: Diagnosis not present

## 2022-07-02 LAB — POCT INR: INR: 2 (ref 2.0–3.0)

## 2022-07-02 NOTE — Progress Notes (Addendum)
Pt missed dose about 2-3 days. Pt reports disorientation and holding fluid for about 1 week. Scheduled pt apt with PCP for tomorrow. Pt is taking 1/2 zyrtec every morning.  Continue 1 tablet daily . Recheck in 4 weeks.

## 2022-07-02 NOTE — Patient Instructions (Addendum)
Pre visit review using our clinic review tool, if applicable. No additional management support is needed unless otherwise documented below in the visit note. Continue 1 tablet daily.  Re-check in 4 weeks.   

## 2022-07-03 ENCOUNTER — Encounter: Payer: Self-pay | Admitting: Family Medicine

## 2022-07-03 ENCOUNTER — Ambulatory Visit (INDEPENDENT_AMBULATORY_CARE_PROVIDER_SITE_OTHER): Payer: Medicare Other | Admitting: Family Medicine

## 2022-07-03 VITALS — BP 128/68 | HR 72 | Temp 98.0°F | Ht 65.0 in | Wt 174.4 lb

## 2022-07-03 DIAGNOSIS — R8290 Unspecified abnormal findings in urine: Secondary | ICD-10-CM | POA: Diagnosis not present

## 2022-07-03 DIAGNOSIS — R410 Disorientation, unspecified: Secondary | ICD-10-CM

## 2022-07-03 DIAGNOSIS — D500 Iron deficiency anemia secondary to blood loss (chronic): Secondary | ICD-10-CM | POA: Diagnosis not present

## 2022-07-03 DIAGNOSIS — R600 Localized edema: Secondary | ICD-10-CM | POA: Diagnosis not present

## 2022-07-03 DIAGNOSIS — R4189 Other symptoms and signs involving cognitive functions and awareness: Secondary | ICD-10-CM | POA: Diagnosis not present

## 2022-07-03 DIAGNOSIS — F418 Other specified anxiety disorders: Secondary | ICD-10-CM

## 2022-07-03 DIAGNOSIS — R5382 Chronic fatigue, unspecified: Secondary | ICD-10-CM | POA: Diagnosis not present

## 2022-07-03 DIAGNOSIS — R7309 Other abnormal glucose: Secondary | ICD-10-CM

## 2022-07-03 DIAGNOSIS — R350 Frequency of micturition: Secondary | ICD-10-CM

## 2022-07-03 LAB — COMPREHENSIVE METABOLIC PANEL
ALT: 38 U/L — ABNORMAL HIGH (ref 0–35)
AST: 37 U/L (ref 0–37)
Albumin: 4 g/dL (ref 3.5–5.2)
Alkaline Phosphatase: 50 U/L (ref 39–117)
BUN: 22 mg/dL (ref 6–23)
CO2: 30 mEq/L (ref 19–32)
Calcium: 9.2 mg/dL (ref 8.4–10.5)
Chloride: 104 mEq/L (ref 96–112)
Creatinine, Ser: 0.87 mg/dL (ref 0.40–1.20)
GFR: 59.8 mL/min — ABNORMAL LOW (ref >60.00)
Glucose, Bld: 88 mg/dL (ref 70–99)
Potassium: 4.9 mEq/L (ref 3.5–5.1)
Sodium: 141 mEq/L (ref 135–145)
Total Bilirubin: 0.8 mg/dL (ref 0.2–1.2)
Total Protein: 6.8 g/dL (ref 6.0–8.3)

## 2022-07-03 LAB — CBC WITH DIFFERENTIAL/PLATELET
Basophils Absolute: 0 10*3/uL (ref 0.0–0.1)
Basophils Relative: 0.9 % (ref 0.0–3.0)
Eosinophils Absolute: 0.1 10*3/uL (ref 0.0–0.7)
Eosinophils Relative: 2.2 % (ref 0.0–5.0)
HCT: 39 % (ref 36.0–46.0)
Hemoglobin: 13 g/dL (ref 12.0–15.0)
Lymphocytes Relative: 24.5 % (ref 12.0–46.0)
Lymphs Abs: 1.4 10*3/uL (ref 0.7–4.0)
MCHC: 33.5 g/dL (ref 30.0–36.0)
MCV: 86.6 fl (ref 78.0–100.0)
Monocytes Absolute: 0.6 10*3/uL (ref 0.1–1.0)
Monocytes Relative: 10.4 % (ref 3.0–12.0)
Neutro Abs: 3.5 10*3/uL (ref 1.4–7.7)
Neutrophils Relative %: 62 % (ref 43.0–77.0)
Platelets: 124 10*3/uL — ABNORMAL LOW (ref 150.0–400.0)
RBC: 4.5 Mil/uL (ref 3.87–5.11)
RDW: 15.3 % (ref 11.5–15.5)
WBC: 5.6 10*3/uL (ref 4.0–10.5)

## 2022-07-03 LAB — POC URINALSYSI DIPSTICK (AUTOMATED)
Bilirubin, UA: NEGATIVE
Blood, UA: 80 — AB
Glucose, UA: NEGATIVE
Ketones, UA: NEGATIVE
Nitrite, UA: NEGATIVE
Protein, UA: POSITIVE — AB
Spec Grav, UA: 1.01 (ref 1.010–1.025)
Urobilinogen, UA: 1 E.U./dL
pH, UA: 6 (ref 5.0–8.0)

## 2022-07-03 LAB — HEMOGLOBIN A1C: Hgb A1c MFr Bld: 5.8 % (ref 4.6–6.5)

## 2022-07-03 LAB — FERRITIN: Ferritin: 107.2 ng/mL (ref 10.0–291.0)

## 2022-07-03 LAB — IRON: Iron: 71 ug/dL (ref 42–145)

## 2022-07-03 LAB — TSH: TSH: 3.24 u[IU]/mL (ref 0.35–5.50)

## 2022-07-03 MED ORDER — NITROFURANTOIN MONOHYD MACRO 100 MG PO CAPS: 100.0000 mg | ORAL_CAPSULE | Freq: Two times a day (BID) | ORAL | 0 refills | Status: DC

## 2022-07-03 NOTE — Assessment & Plan Note (Signed)
Not bad today but worse later in day and uncomfortable  At times worse  Venous insuff Uses lasix prn   Suspect compression would help  She is intol to many stockings   Enc her to look for some support knee highs made out of cotton to see if she tolerates better Lab today

## 2022-07-03 NOTE — Assessment & Plan Note (Signed)
Worse mood lately  Ups and downs Reviewed stressors/ coping techniques/symptoms/ support sources/ tx options and side effects in detail today Candidly discusses symptoms and stressors   Thinks she is worse in winter  Needs to get outside  Wants to try a SAD light before any medication

## 2022-07-03 NOTE — Assessment & Plan Note (Signed)
More fatigue and brain fog recently   Lab today  Takes oral feso4 325 mg daily

## 2022-07-03 NOTE — Assessment & Plan Note (Signed)
Ua with leuk and pos for blood and protein today Cx pending   Tx with macrobid (is pcn all) Enc fluids   ? If causing mental status changes

## 2022-07-03 NOTE — Assessment & Plan Note (Signed)
Yesterday Resolved after a nap  Pos ua-will tx potential uti Lab pending   Deals with baseline brain fog Some psych factors may also be in play   Not confused at all today

## 2022-07-03 NOTE — Assessment & Plan Note (Signed)
Multifactorial Had episode of worse brain fog and confusion yesterday- better after a nap ? If psych factors  Lab today   Ua positive= ? Uti cx pending

## 2022-07-03 NOTE — Progress Notes (Unsigned)
Subjective:    Patient ID: [name], female    DOB: 01/21/1933, 89 y.o.   MRN: [id number]  HPI Pt presents for edema and mental status change/disorientation  Wt Readings from Last 3 Encounters:  07/03/22 174 lb 6 oz (79.1 kg)  04/20/22 173 lb (78.5 kg)  03/31/22 171 lb (77.6 kg)   29.02 kg/m  Vitals:   07/03/22 1058  BP: 128/68  Pulse: 72  Temp: 98 F (36.7 C)  SpO2: 97%   Lab Results  Component Value Date   INR 2.0 07/02/2022   INR 2.9 06/04/2022   INR 2.7 05/07/2022   Seen yesterday for coumadin  Mentioned symptoms   Yesterday felt disorientated  Woke up with it  Seen here for INR and then went home and felt terrible  Could not get her thoughts together / scrambled  Tight in her head  No cardiac symptoms No hallucination   Went home  Lay down and slept for 4 hours   Generally sleeps well at night  Gets up with bladder occ   Frequent urination at night  Frequency - drinks lots of water  She cut her caffeine  Low back pain and stiffness   Brain fog  - tired of fighting this She thought she was affected by radiation  Has felt down in general   Ua today Results for orders placed or performed in visit on 07/03/22  POCT Urinalysis Dipstick (Automated)  Result Value Ref Range   Color, UA Yellow    Clarity, UA Clear    Glucose, UA Negative Negative   Bilirubin, UA Negative    Ketones, UA Negative    Spec Grav, UA 1.010 1.010 - 1.025   Blood, UA 80 Ery/uL (A)    pH, UA 6.0 5.0 - 8.0   Protein, UA Positive (A) Negative   Urobilinogen, UA 1.0 0.2 or 1.0 E.U./dL   Nitrite, UA Negative    Leukocytes, UA Moderate (2+) (A) Negative   *Note: Due to a large number of results and/or encounters for the requested time period, some results have not been displayed. A complete set of results can be found in Results Review.      HTN bp is stable today  No cp or palpitations or headaches or edema  No side effects to medicines  BP Readings from Last 3  Encounters:  07/03/22 128/68  04/20/22 (!) 156/80  03/31/22 132/64      Pedal edema -more than usual  Lasix 40 mg prn - usually take 1/2   If she wears support socks  -she breaks out     Has appt with cardiology in may   Lab Results  Component Value Date   CREATININE 0.78 03/12/2022   BUN 25 (H) 03/12/2022   NA 142 03/12/2022   K 4.8 03/12/2022   CL 105 03/12/2022   CO2 29 03/12/2022   .lastsliver Lab Results  Component Value Date   TSH 4.94 03/12/2022   Levothy 75 mcg daily   Lab Results  Component Value Date   HGBA1C 6.0 03/12/2022   Lab Results  Component Value Date   CHOL 194 03/12/2022   HDL 34.80 (L) 03/12/2022   LDLCALC 134 (H) 03/12/2022   LDLDIRECT 203.8 01/09/2008   TRIG 125.0 03/12/2022   CHOLHDL 6 03/12/2022   Patient Active Problem List   Diagnosis Date Noted   Confusion 07/03/2022   Frequent urination 07/03/2022   Rash 04/01/2022   Encounter for screening mammogram for breast  cancer 03/19/2022   PND (post-nasal drip) 12/11/2021   Other fatigue 10/08/2021   Brain fog 01/14/2021   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/20/2020   Post-menopausal bleeding 11/20/2020   Facial paresthesia 10/14/2020   Grief reaction 09/20/2020   Hearing loss 06/13/2020   Atrial fibrillation 04/24/2020   Routine general medical examination at a health care facility 03/11/2020   Elevated glucose 03/03/2020   History of TIA (transient ischemic attack) 02/18/2020   S/P AV nodal ablation 07/28/19 07/29/2019   S/P placement of cardiac pacemaker MDT 07/28/19 07/29/2019   AV block 07/28/2019   History of CVA (cerebrovascular accident) 06/16/2019   Facial tingling 11/29/2018   Tremor of left hand 11/29/2018   Medicare annual wellness visit, subsequent 11/24/2018   Dysuria 02/06/2018   Dizzy 02/04/2018   Iron deficiency anemia 10/21/2017   Rapid atrial fibrillation 08/19/2017   Constipation 08/02/2017   Unilateral primary osteoarthritis, left hip 05/04/2017   Status post  total replacement of left hip 05/04/2017   Hip osteoarthritis 04/27/2017   Long term (current) use of anticoagulants 03/04/2017   Venous stasis dermatitis of both lower extremities 01/08/2017   Osteopenia 10/25/2016   Pedal edema 08/26/2016   Varicose veins of both lower extremities 08/26/2016   Estrogen deficiency 08/26/2016   Screening mammogram, encounter for 08/26/2016   Hemorrhoids 08/26/2016   History of nonmelanoma skin cancer 01/01/2016   Pruritus 10/04/2015   Urticaria 08/22/2014   Hip pain 08/02/2014   Left knee pain 08/02/2014   Chronic cough 05/08/2014   Hematochezia 04/09/2014   Colon cancer screening 08/16/2013   Fatigue 08/16/2013   Encounter for therapeutic drug monitoring 04/20/2013   Left ovarian cyst 03/14/2013   (HFpEF) heart failure with preserved ejection fraction 12/27/2012   COLONIC POLYPS, ADENOMATOUS, HX OF 09/18/2009   PULMONARY NODULE 12/20/2008   GANGLION CYST 10/04/2007   Mixed incontinence 04/28/2007   Hyperlipidemia 04/27/2007   Depression with anxiety 04/27/2007   Asthma, mild intermittent 04/27/2007   INSOMNIA 04/27/2007   ADENOMATOUS COLONIC POLYP 11/04/2006   Hypothyroidism 09/02/2006   Atrial fibrillation, chronic 08/05/2006   Past Medical History:  Diagnosis Date   Allergic rhinitis    Alopecia 2/2 beta blockers    Anemia    Arthritis    Atrial fibrillation -persistent cardiologist-  dr klein/  primary EP -- dr Julian Hy ([place])   a. s/p PVI [name] 2010;  b. on tikosyn/coumadin;  c. 05/2009 Echo: EF 60-65%, Gr 2 DD. (first dx 09/ 2007)   Bilateral lower extremity edema    Bleeding hemorrhoid    Carotid stenosis    mild (hosp 3/11)- consult by vasc/ Dr Arbie Cookey   Complication of anesthesia    hard to wake   Diverticulosis of colon    Dyspnea    on exertion-climbing stairs   Fatty liver    H/O cardiac radiofrequency ablation    01/ 2008 at [facility] [place] /  03/ 2010  at [facility] failure with preserved ejection  fraction    History of adenomatous polyp of colon    tubular adenoma's   History of cardiomyopathy    secondary tachycardia-induced cardiomyopathy -- resolved 2014   History of squamous cell carcinoma in situ (SCCIS) of skin    05/ 2017  nasal bridge and right medial knee   History of transient ischemic attack (TIA)    01-24-2005 and 06-12-2009   Hyperlipidemia    Hypothyroidism    Mild intermittent asthma    reacts to cats  Mixed stress and urge urinary incontinence    Presence of permanent cardiac pacemaker    was put in 07/2019   Pulmonary nodule    S/P AV nodal ablation 07/28/19 07/29/2019   S/P mitral valve repair 10-23-1998  dr Elsie Ra at [facility]   for MVP and regurg. (annuloplasty ring procedure)   S/P placement of cardiac pacemaker MDT 07/28/19 07/29/2019   Past Surgical History:  Procedure Laterality Date   APPENDECTOMY  1978   AV NODE ABLATION N/A 07/28/2019   Procedure: AV NODE ABLATION;  Surgeon: [name] Salvia, MD;  Location: [facility] INVASIVE CV LAB;  Service: Cardiovascular;  Laterality: N/A;   BUBBLE STUDY  06/19/2019   Procedure: BUBBLE STUDY;  Surgeon: Chrystie Nose, MD;  Location: [facility] ENDOSCOPY;  Service: Cardiovascular;;   CARDIAC ELECTROPHYSIOLOGY MAPPING AND ABLATION  01/ 2008    at [place] [facility] [place]   right-sided ablation atrial flutter   CARDIAC ELECTROPHYSIOLOGY STUDY AND ABLATION  03/ 2010   dr Barrett Shell at [facility]   AV node ablation and pulmonary vein isolation for atrial fib   CARDIOVERSION  06-18-2006;  07-13-2006;  10-19-2010;  10-27-2010   CATARACT EXTRACTION W/PHACO Right 04/09/2021   Procedure: CATARACT EXTRACTION PHACO AND INTRAOCULAR LENS PLACEMENT (IOC) RIGHT 6.71 01:11.1;  Surgeon: Lockie Mola, MD;  Location: [facility] SURGERY CNTR;  Service: Ophthalmology;  Laterality: Right;   CATARACT EXTRACTION W/PHACO Left 04/23/2021   Procedure: CATARACT EXTRACTION PHACO AND INTRAOCULAR LENS PLACEMENT (IOC) LEFT;  Surgeon: Lockie Mola, MD;   Location: [facility] SURGERY CNTR;  Service: Ophthalmology;  Laterality: Left;  [place] 5.65 00:50.1   COLONOSCOPY     COLONOSCOPY WITH PROPOFOL N/A 10/13/2017   Procedure: COLONOSCOPY WITH PROPOFOL;  Surgeon: Wyline Mood, MD;  Location: [facility] ENDOSCOPY;  Service: Gastroenterology;  Laterality: N/A;   COLONOSCOPY WITH PROPOFOL N/A 02/20/2020   Procedure: COLONOSCOPY WITH PROPOFOL;  Surgeon: Regis Bill, MD;  Location: [facility] ENDOSCOPY;  Service: Endoscopy;  Laterality: N/A;   CYSTO/ TRANSURETHRAL COLLAGEN INJECTION THERAPY  07-26-2007   dr Sherron Monday   DILATION AND CURETTAGE OF UTERUS     ESOPHAGOGASTRODUODENOSCOPY (EGD) WITH PROPOFOL N/A 10/13/2017   Procedure: ESOPHAGOGASTRODUODENOSCOPY (EGD) WITH PROPOFOL;  Surgeon: Wyline Mood, MD;  Location: [facility] ENDOSCOPY;  Service: Gastroenterology;  Laterality: N/A;   EVALUATION UNDER ANESTHESIA WITH HEMORRHOIDECTOMY N/A 04/09/2020   Procedure: EXAM UNDER ANESTHESIA WITH HEMORRHOIDECTOMY;  Surgeon: Leafy Ro, MD;  Location: [facility] ORS;  Service: General;  Laterality: N/A;   EXCISIONAL HEMORRHOIDECTOMY  1980s   GIVENS CAPSULE STUDY N/A 12/08/2017   Procedure: GIVENS CAPSULE STUDY;  Surgeon: Wyline Mood, MD;  Location: [facility] ENDOSCOPY;  Service: Gastroenterology;  Laterality: N/A;   HEMORRHOID SURGERY N/A 10/29/2016   Procedure: HEMORRHOIDECTOMY;  Surgeon: Romie Levee, MD;  Location: [facility];  Service: General;  Laterality: N/A;   MITRAL VALVE ANNULOPLASTY  10/23/1998   "Model [id number]; Ronni Rumble [id number]"; size 32mm; [facility]; Dr. Elsie Ra   PACEMAKER IMPLANT N/A 07/28/2019   Procedure: PACEMAKER IMPLANT;  Surgeon: [name] Salvia, MD;  Location: [facility] INVASIVE CV LAB;  Service: Cardiovascular;  Laterality: N/A;   PILONIDAL CYST EXCISION  1954   TEE WITH CARDIOVERSION  05-06-2006 at [facility];  01-02-2013 at [facility]   TEE WITHOUT CARDIOVERSION N/A 06/19/2019   Procedure: TRANSESOPHAGEAL ECHOCARDIOGRAM (TEE);  Surgeon: Chrystie Nose, MD;   Location: [facility] ENDOSCOPY;  Service: Cardiovascular;  Laterality: N/A;   TOTAL HIP ARTHROPLASTY Left 05/04/2017   Procedure: LEFT TOTAL HIP ARTHROPLASTY ANTERIOR APPROACH;  Surgeon: Doneen Poisson  Y, MD;  Location: [facility] OR;  Service: Orthopedics;  Laterality: Left;   TRANSTHORACIC ECHOCARDIOGRAM  05-01-2015   dr Graciela Husbands   ef 50-55%/  mild AV sclerosis without stenosis/  post MV repair with mild central MR (valve area by pressure half-time 2cm^2,  valve area by continutity equation 0.91cm^2, peak grandiant 17mmHg)/  severe LAE/ mild TR/ mild RAE    TUBAL LIGATION Bilateral 1978   Social History   Tobacco Use   Smoking status: Never   Smokeless tobacco: Never  Vaping Use   Vaping Use: Never used  Substance Use Topics   Alcohol use: Not Currently    Comment: seldom   Drug use: No   Family History  Problem Relation Age of Onset   Lung cancer Father        smoker, died at 52   Alcohol abuse Father    Cancer Father        bladder and lung CA smoker   Sudden death Other    Breast cancer Neg Hx    Stroke Neg Hx    Allergies  Allergen Reactions   Amiodarone Swelling    SWELLING REACTION UNSPECIFIED    Penicillins Hives and Rash    Has patient had a PCN reaction causing immediate rash, facial/tongue/throat swelling, SOB or lightheadedness with hypotension: No Has patient had a PCN reaction causing severe rash involving mucus membranes or skin necrosis: No Has patient had a PCN reaction that required hospitalization:Patient was inpatient when reaction occurred Has patient had a PCN reaction occurring within the last 10 years: No If all of the above answers are "NO", then may proceed with Cephalosporin use   Statins Rash    REACTION: rash   Current Outpatient Medications on File Prior to Visit  Medication Sig Dispense Refill   albuterol (VENTOLIN HFA) 108 (90 Base) MCG/ACT inhaler Inhale into the lungs every 6 (six) hours as needed for wheezing or shortness of breath.     cetirizine  (ZYRTEC) 10 MG tablet Take 5 mg by mouth daily.     diphenhydrAMINE-zinc acetate (BENADRYL) cream Apply 1 application topically 3 (three) times daily as needed for itching.     ferrous sulfate 325 (65 FE) MG EC tablet TAKE 1 TABLET EVERY DAY WITH BREAKFAST 90 tablet 2   fluticasone (FLONASE) 50 MCG/ACT nasal spray Place 1 spray into both nostrils 2 (two) times daily as needed for allergies or rhinitis. 16 g 11   furosemide (LASIX) 40 MG tablet Take 40 mg by mouth daily as needed for edema.     levothyroxine (SYNTHROID) 75 MCG tablet TAKE 1 TABLET EVERY DAY BEFORE BREAKFAST 90 tablet 1   Polyethyl Glycol-Propyl Glycol 0.4-0.3 % SOLN Place 1-2 drops into both eyes 3 (three) times daily as needed (for dry eyes.).     triamcinolone cream (KENALOG) 0.5 % Apply 1 Application topically 2 (two) times daily as needed. 60 g 1   warfarin (COUMADIN) 5 MG tablet TAKE 1 TABLET DAILY EXCEPT TAKE 1 AND 1/2 TABLETS ON WEDNESDAYS OR AS DIRECTED BY COUMADIN CLINIC 100 tablet 1   No current facility-administered medications on file prior to visit.    Review of Systems  Constitutional:  Negative for activity change, appetite change, fatigue, fever and unexpected weight change.  HENT:  Negative for congestion, ear pain, rhinorrhea, sinus pressure and sore throat.   Eyes:  Negative for pain, redness and visual disturbance.  Respiratory:  Negative for cough, shortness of breath and wheezing.   Cardiovascular:  Positive for leg swelling. Negative for chest pain and palpitations.  Gastrointestinal:  Negative for abdominal pain, blood in stool, constipation and diarrhea.  Endocrine: Negative for polydipsia and polyuria.  Genitourinary:  Positive for frequency. Negative for dysuria and urgency.  Musculoskeletal:  Negative for arthralgias, back pain and myalgias.  Skin:  Negative for pallor and rash.  Allergic/Immunologic: Negative for environmental allergies.  Neurological:  Negative for dizziness, syncope and  headaches.  Hematological:  Negative for adenopathy. Does not bruise/bleed easily.  Psychiatric/Behavioral:  Positive for confusion, decreased concentration and dysphoric mood. Negative for self-injury and suicidal ideas. The patient is nervous/anxious.        Objective:   Physical Exam Constitutional:      General: She is not in acute distress.    Appearance: Normal appearance. She is well-developed. She is not ill-appearing or diaphoretic.     Comments: overwt  HENT:     Head: Normocephalic and atraumatic.  Eyes:     Conjunctiva/sclera: Conjunctivae normal.     Pupils: Pupils are equal, round, and reactive to light.  Neck:     Thyroid: No thyromegaly.     Vascular: No carotid bruit or JVD.  Cardiovascular:     Rate and Rhythm: Normal rate and regular rhythm.     Heart sounds: Normal heart sounds.     No gallop.  Pulmonary:     Effort: Pulmonary effort is normal. No respiratory distress.     Breath sounds: Normal breath sounds. No wheezing or rales.  Abdominal:     General: There is no distension or abdominal bruit.     Palpations: Abdomen is soft.  Musculoskeletal:     Cervical back: Normal range of motion and neck supple.     Right lower leg: No edema.     Left lower leg: No edema.  Lymphadenopathy:     Cervical: No cervical adenopathy.  Skin:    General: Skin is warm and dry.     Coloration: Skin is not jaundiced or pale.     Findings: No rash.  Neurological:     Mental Status: She is alert.     Coordination: Coordination normal.     Deep Tendon Reflexes: Reflexes are normal and symmetric. Reflexes normal.  Psychiatric:        Attention and Perception: Attention normal.        Mood and Affect: Mood is anxious and depressed.        Speech: Speech normal.        Cognition and Memory: Cognition and memory normal.     Comments: Candidly discusses symptoms and stressors  Discusses her worries and also impact of brain fog   Mentally sharp today                Assessment & Plan:   Problem List Items Addressed This Visit       Nervous and Auditory   Confusion - Primary    Yesterday Resolved after a nap  Pos ua-will tx potential uti Lab pending   Deals with baseline brain fog Some psych factors may also be in play   Not confused at all today         Other   Brain fog    Still problematic Pt still blames exposure to magnetic radiation  Mood has been worse lately       Depression with anxiety    Worse mood lately  Ups and downs Reviewed stressors/ coping techniques/symptoms/ support sources/ tx options and  side effects in detail today Candidly discusses symptoms and stressors   Thinks she is worse in winter  Needs to get outside  Wants to try a SAD light before any medication       Elevated glucose    A1c today  disc imp of low glycemic diet and wt loss to prevent DM2        Relevant Orders   Hemoglobin A1c   Fatigue    Multifactorial Had episode of worse brain fog and confusion yesterday- better after a nap ? If psych factors  Lab today   Ua positive= ? Uti cx pending       Relevant Orders   Comprehensive metabolic panel   TSH   CBC with Differential/Platelet   Frequent urination    Ua with leuk and pos for blood and protein today Cx pending   Tx with macrobid (is pcn all) Enc fluids   ? If causing mental status changes       Relevant Orders   POCT Urinalysis Dipstick (Automated) (Completed)   Iron deficiency anemia    More fatigue and brain fog recently   Lab today  Takes oral feso4 325 mg daily       Relevant Orders   CBC with Differential/Platelet   Iron   Ferritin   Pedal edema    Not bad today but worse later in day and uncomfortable  At times worse  Venous insuff Uses lasix prn   Suspect compression would help  She is intol to many stockings   Enc her to look for some support knee highs made out of cotton to see if she tolerates better Lab today      Other Visit Diagnoses      Abnormal urinalysis       Relevant Orders   Urine Culture

## 2022-07-03 NOTE — Assessment & Plan Note (Signed)
A1c today  disc imp of low glycemic diet and wt loss to prevent DM2   

## 2022-07-03 NOTE — Patient Instructions (Addendum)
Gradualted compression socks may help with swelling  Look for som cotton knee high support socks on line   Take care of yourself !  Eat regularly and drink enough fluids   Let's check labs and urinalysis today  We will call with result   As seasonal affective condition/ mood change is possible I do like the light therapy if you want to consider getting a light   Stay active when you can  Get outdoors when the weather allows   Please let us know if symptoms return or worsen

## 2022-07-03 NOTE — Assessment & Plan Note (Signed)
Still problematic Pt still blames exposure to magnetic radiation  Mood has been worse lately

## 2022-07-04 LAB — URINE CULTURE
MICRO NUMBER:: 14818184
Result:: NO GROWTH
SPECIMEN QUALITY:: ADEQUATE

## 2022-07-06 NOTE — Addendum Note (Signed)
Addended by: Shon Millet on: 07/06/2022 04:13 PM   Modules accepted: Orders

## 2022-07-09 ENCOUNTER — Other Ambulatory Visit (INDEPENDENT_AMBULATORY_CARE_PROVIDER_SITE_OTHER): Payer: Medicare Other

## 2022-07-09 DIAGNOSIS — R8290 Unspecified abnormal findings in urine: Secondary | ICD-10-CM | POA: Insufficient documentation

## 2022-07-09 DIAGNOSIS — R350 Frequency of micturition: Secondary | ICD-10-CM | POA: Diagnosis not present

## 2022-07-09 DIAGNOSIS — R4189 Other symptoms and signs involving cognitive functions and awareness: Secondary | ICD-10-CM | POA: Diagnosis not present

## 2022-07-09 LAB — POCT UA - MICROSCOPIC ONLY

## 2022-07-09 LAB — POC URINALSYSI DIPSTICK (AUTOMATED)
Bilirubin, UA: NEGATIVE
Blood, UA: 50 — AB
Glucose, UA: NEGATIVE
Ketones, UA: NEGATIVE
Nitrite, UA: NEGATIVE
Protein, UA: POSITIVE — AB
Spec Grav, UA: 1.015 (ref 1.010–1.025)
Urobilinogen, UA: 0.2 E.U./dL
pH, UA: 6 (ref 5.0–8.0)

## 2022-07-09 NOTE — Progress Notes (Signed)
Results for orders placed or performed in visit on 07/09/22  POCT Urinalysis Dipstick (Automated)  Result Value Ref Range   Color, UA Yellow    Clarity, UA Clear    Glucose, UA Negative Negative   Bilirubin, UA Negative    Ketones, UA Negative    Spec Grav, UA 1.015 1.010 - 1.025   Blood, UA 50 Ery/uL (A)    pH, UA 6.0 5.0 - 8.0   Protein, UA Positive (A) Negative   Urobilinogen, UA 0.2 0.2 or 1.0 E.U./dL   Nitrite, UA Negative    Leukocytes, UA Moderate (2+) (A) Negative  POCT UA - Microscopic Only  Result Value Ref Range   WBC, Ur, HPF, POC 6-10 0 - 5   RBC, Urine, Miroscopic 1-3 0 - 2   Bacteria, U Microscopic few None - Trace   Mucus, UA few    Epithelial cells, urine per micros many    Crystals, Ur, HPF, POC none    Casts, Ur, LPF, POC none    Yeast, UA none    *Note: Due to a large number of results and/or encounters for the requested time period, some results have not been displayed. A complete set of results can be found in Results Review.

## 2022-07-09 NOTE — Addendum Note (Signed)
Addended by: Shon Millet on: 07/09/2022 08:49 AM   Modules accepted: Orders

## 2022-07-09 NOTE — Addendum Note (Signed)
Addended by: Roxy Manns A on: 07/09/2022 10:56 AM   Modules accepted: Orders

## 2022-07-10 LAB — URINE CULTURE
MICRO NUMBER:: 14843306
Result:: NO GROWTH
SPECIMEN QUALITY:: ADEQUATE

## 2022-07-13 ENCOUNTER — Ambulatory Visit
Admission: RE | Admit: 2022-07-13 | Discharge: 2022-07-13 | Disposition: A | Discharge status: Home / Self Care | Payer: Medicare Other | Source: Ambulatory Visit | Attending: Family Medicine | Admitting: Family Medicine

## 2022-07-13 DIAGNOSIS — M8589 Other specified disorders of bone density and structure, multiple sites: Secondary | ICD-10-CM | POA: Diagnosis not present

## 2022-07-13 DIAGNOSIS — E2839 Other primary ovarian failure: Secondary | ICD-10-CM | POA: Insufficient documentation

## 2022-07-14 ENCOUNTER — Encounter: Payer: Self-pay | Admitting: *Deleted

## 2022-07-16 ENCOUNTER — Ambulatory Visit (INDEPENDENT_AMBULATORY_CARE_PROVIDER_SITE_OTHER): Payer: Medicare Other | Admitting: Physician Assistant

## 2022-07-16 ENCOUNTER — Encounter: Payer: Self-pay | Admitting: Oncology

## 2022-07-16 VITALS — BP 124/70 | HR 71 | Ht 65.0 in | Wt 174.1 lb

## 2022-07-16 DIAGNOSIS — R3121 Asymptomatic microscopic hematuria: Secondary | ICD-10-CM

## 2022-07-16 DIAGNOSIS — R32 Unspecified urinary incontinence: Secondary | ICD-10-CM

## 2022-07-16 DIAGNOSIS — R310 Gross hematuria: Secondary | ICD-10-CM | POA: Diagnosis not present

## 2022-07-16 LAB — MICROSCOPIC EXAMINATION

## 2022-07-16 LAB — URINALYSIS, COMPLETE
Bilirubin, UA: NEGATIVE
Glucose, UA: NEGATIVE
Ketones, UA: NEGATIVE
Leukocytes,UA: NEGATIVE
Nitrite, UA: NEGATIVE
Specific Gravity, UA: 1.015 (ref 1.005–1.030)
Urobilinogen, Ur: 0.2 mg/dL (ref 0.2–1.0)
pH, UA: 5 (ref 5.0–7.5)

## 2022-07-16 NOTE — Progress Notes (Signed)
07/16/2022 12:08 PM   [name] 1932/08/13 [id number]  CC: Chief Complaint  Patient presents with   Hematuria   HPI: [name] is a 89 y.o. female with PMH A-fib on Coumadin and urinary incontinence who presents today for evaluation of microscopic hematuria.   She saw her PCP on 07/03/2022 for evaluation of edema and mental status change/disorientation.  A UA was obtained based on her reports of urinary frequency, which was notable for blood, protein, and 2+ leukocytes.  Urine microscopy was not performed.  She was treated with Macrobid 100 mg twice daily x 5 days.  Urine culture finalized with no growth.  UA and microscopy were performed again 6 days later, this time UA was notable for blood, protein, and 2+ leukocytes and urine microscopy with 6-10 WBC/hpf, 1-3 RBC/hpf, and many epithelial cells.  Urine culture again finalized with no growth.  Today she reports she had some low back pain at the time of her original appointment with her PCP, which improved with antibiotics, however she is still having some stiffness in her back.  She had originally attributed her discomfort to musculoskeletal back pain.  She denies a history of gross hematuria or dysuria.  She is a never smoker, no known industrial exposures.  She reports her father was a heavy smoker and died from cancer that started in his bladder.  She also reports continued urinary incontinence, for which she wears up to 6 pads daily.  They are damp at the most.  She is minimally bothered by this.  On chart review, she has had no other episodes of microscopic hematuria.  In-office catheterized UA today positive for 3+ blood and trace protein; urine microscopy with 3-10 RBCs/HPF.  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 s/p PVI [name] 2010;   b. on tikosyn/coumadin;  c. 05/2009 Echo: EF 60-65%, Gr 2 DD. (first dx 09/ 2007)   Bilateral lower extremity edema    Bleeding hemorrhoid    Carotid stenosis    mild (hosp 3/11)- consult by vasc/ Dr Arbie Cookey   Complication of anesthesia    hard to wake   Diverticulosis of colon    Dyspnea    on exertion-climbing stairs   Fatty liver    H/O cardiac radiofrequency ablation    01/ 2008 at [place] [facility] [place] /  03/ 2010  at [facility] failure with preserved ejection fraction    History of adenomatous polyp of colon    tubular adenoma's   History of cardiomyopathy    secondary tachycardia-induced cardiomyopathy -- resolved 2014   History of squamous cell carcinoma in situ (SCCIS) of skin    05/ 2017  nasal bridge and right medial knee   History of transient ischemic attack (TIA)    01-24-2005 and 06-12-2009   Hyperlipidemia    Hypothyroidism    Mild intermittent asthma    reacts to cats   Mixed stress and urge urinary incontinence    Presence of permanent cardiac pacemaker    was put in 07/2019   Pulmonary nodule    S/P AV nodal ablation 07/28/19 07/29/2019   S/P mitral valve repair 10-23-1998  dr Elsie Ra at [facility]   for MVP and regurg. (annuloplasty ring procedure)   S/P  placement of cardiac pacemaker MDT 07/28/19 07/29/2019    Surgical History: Past Surgical History:  Procedure Laterality Date   APPENDECTOMY  1978   AV NODE ABLATION N/A 07/28/2019   Procedure: AV NODE ABLATION;  Surgeon: [name] Salvia, MD;  Location: [facility] INVASIVE CV LAB;  Service: Cardiovascular;  Laterality: N/A;   BUBBLE STUDY  06/19/2019   Procedure: BUBBLE STUDY;  Surgeon: Chrystie Nose, MD;  Location: [facility] ENDOSCOPY;  Service: Cardiovascular;;   CARDIAC ELECTROPHYSIOLOGY MAPPING AND ABLATION  01/ 2008    at [place] [facility] [place]   right-sided ablation atrial flutter   CARDIAC ELECTROPHYSIOLOGY STUDY AND ABLATION  03/ 2010   dr Barrett Shell at [facility]   AV node ablation and pulmonary vein isolation for  atrial fib   CARDIOVERSION  06-18-2006;  07-13-2006;  10-19-2010;  10-27-2010   CATARACT EXTRACTION W/PHACO Right 04/09/2021   Procedure: CATARACT EXTRACTION PHACO AND INTRAOCULAR LENS PLACEMENT (IOC) RIGHT 6.71 01:11.1;  Surgeon: Lockie Mola, MD;  Location: [facility] SURGERY CNTR;  Service: Ophthalmology;  Laterality: Right;   CATARACT EXTRACTION W/PHACO Left 04/23/2021   Procedure: CATARACT EXTRACTION PHACO AND INTRAOCULAR LENS PLACEMENT (IOC) LEFT;  Surgeon: Lockie Mola, MD;  Location: [facility] SURGERY CNTR;  Service: Ophthalmology;  Laterality: Left;  [place] 5.65 00:50.1   COLONOSCOPY     COLONOSCOPY WITH PROPOFOL N/A 10/13/2017   Procedure: COLONOSCOPY WITH PROPOFOL;  Surgeon: Wyline Mood, MD;  Location: [facility] ENDOSCOPY;  Service: Gastroenterology;  Laterality: N/A;   COLONOSCOPY WITH PROPOFOL N/A 02/20/2020   Procedure: COLONOSCOPY WITH PROPOFOL;  Surgeon: Regis Bill, MD;  Location: [facility] ENDOSCOPY;  Service: Endoscopy;  Laterality: N/A;   CYSTO/ TRANSURETHRAL COLLAGEN INJECTION THERAPY  07-26-2007   dr Sherron Monday   DILATION AND CURETTAGE OF UTERUS     ESOPHAGOGASTRODUODENOSCOPY (EGD) WITH PROPOFOL N/A 10/13/2017   Procedure: ESOPHAGOGASTRODUODENOSCOPY (EGD) WITH PROPOFOL;  Surgeon: Wyline Mood, MD;  Location: [facility] ENDOSCOPY;  Service: Gastroenterology;  Laterality: N/A;   EVALUATION UNDER ANESTHESIA WITH HEMORRHOIDECTOMY N/A 04/09/2020   Procedure: EXAM UNDER ANESTHESIA WITH HEMORRHOIDECTOMY;  Surgeon: Leafy Ro, MD;  Location: [facility] ORS;  Service: General;  Laterality: N/A;   EXCISIONAL HEMORRHOIDECTOMY  1980s   GIVENS CAPSULE STUDY N/A 12/08/2017   Procedure: GIVENS CAPSULE STUDY;  Surgeon: Wyline Mood, MD;  Location: [facility] ENDOSCOPY;  Service: Gastroenterology;  Laterality: N/A;   HEMORRHOID SURGERY N/A 10/29/2016   Procedure: HEMORRHOIDECTOMY;  Surgeon: Romie Levee, MD;  Location: [place];  Service: General;  Laterality: N/A;   MITRAL VALVE  ANNULOPLASTY  10/23/1998   "Model [id number]; Ronni Rumble [id number]"; size 32mm; [facility]; Dr. Elsie Ra   PACEMAKER IMPLANT N/A 07/28/2019   Procedure: PACEMAKER IMPLANT;  Surgeon: [name] Salvia, MD;  Location: [facility] INVASIVE CV LAB;  Service: Cardiovascular;  Laterality: N/A;   PILONIDAL CYST EXCISION  1954   TEE WITH CARDIOVERSION  05-06-2006 at [facility];  01-02-2013 at [facility]   TEE WITHOUT CARDIOVERSION N/A 06/19/2019   Procedure: TRANSESOPHAGEAL ECHOCARDIOGRAM (TEE);  Surgeon: Chrystie Nose, MD;  Location: [facility] ENDOSCOPY;  Service: Cardiovascular;  Laterality: N/A;   TOTAL HIP ARTHROPLASTY Left 05/04/2017   Procedure: LEFT TOTAL HIP ARTHROPLASTY ANTERIOR APPROACH;  Surgeon: Kathryne Hitch, MD;  Location: [facility] OR;  Service: Orthopedics;  Laterality: Left;   TRANSTHORACIC ECHOCARDIOGRAM  05-01-2015   dr Graciela Husbands   ef 50-55%/  mild AV sclerosis without stenosis/  post MV repair with mild central MR (valve area by pressure half-time 2cm^2,  valve area by continutity equation 0.91cm^2, peak grandiant 67mmHg)/  severe LAE/ mild TR/ mild RAE    TUBAL LIGATION Bilateral 1978    Home Medications:  Allergies as of 07/16/2022       Reactions   Amiodarone Swelling   SWELLING REACTION UNSPECIFIED    Penicillins Hives, Rash   Has patient had a PCN reaction causing immediate rash, facial/tongue/throat swelling, SOB or lightheadedness with hypotension: No Has patient had a PCN reaction causing severe rash involving mucus membranes or skin necrosis: No Has patient had a PCN reaction that required hospitalization:Patient was inpatient when reaction occurred Has patient had a PCN reaction occurring within the last 10 years: No If all of the above answers are "NO", then may proceed with Cephalosporin use   Statins Rash   REACTION: rash        Medication List        Accurate as of July 16, 2022 12:08 PM. If you have any questions, ask your nurse or doctor.          STOP taking these medications     nitrofurantoin (macrocrystal-monohydrate) 100 MG capsule Commonly known as: Macrobid Stopped by: Carman Ching, PA-C       TAKE these medications    albuterol 108 (90 Base) MCG/ACT inhaler Commonly known as: VENTOLIN HFA Inhale into the lungs every 6 (six) hours as needed for wheezing or shortness of breath.   cetirizine 10 MG tablet Commonly known as: ZYRTEC Take 5 mg by mouth daily.   diphenhydrAMINE-zinc acetate cream Commonly known as: BENADRYL Apply 1 application topically 3 (three) times daily as needed for itching.   ferrous sulfate 325 (65 FE) MG EC tablet TAKE 1 TABLET EVERY DAY WITH BREAKFAST   fluticasone 50 MCG/ACT nasal spray Commonly known as: FLONASE Place 1 spray into both nostrils 2 (two) times daily as needed for allergies or rhinitis.   furosemide 40 MG tablet Commonly known as: LASIX Take 40 mg by mouth daily as needed for edema.   levothyroxine 75 MCG tablet Commonly known as: SYNTHROID TAKE 1 TABLET EVERY DAY BEFORE BREAKFAST   Polyethyl Glycol-Propyl Glycol 0.4-0.3 % Soln Place 1-2 drops into both eyes 3 (three) times daily as needed (for dry eyes.).   triamcinolone cream 0.5 % Commonly known as: KENALOG Apply 1 Application topically 2 (two) times daily as needed.   warfarin 5 MG tablet Commonly known as: COUMADIN Take as directed by the anticoagulation clinic. If you are unsure how to take this medication, talk to your nurse or doctor. Original instructions: TAKE 1 TABLET DAILY EXCEPT TAKE 1 AND 1/2 TABLETS ON WEDNESDAYS OR AS DIRECTED BY COUMADIN CLINIC        Allergies:  Allergies  Allergen Reactions   Amiodarone Swelling    SWELLING REACTION UNSPECIFIED    Penicillins Hives and Rash    Has patient had a PCN reaction causing immediate rash, facial/tongue/throat swelling, SOB or lightheadedness with hypotension: No Has patient had a PCN reaction causing severe rash involving mucus membranes or skin necrosis: No Has  patient had a PCN reaction that required hospitalization:Patient was inpatient when reaction occurred Has patient had a PCN reaction occurring within the last 10 years: No If all of the above answers are "NO", then may proceed with Cephalosporin use   Statins Rash    REACTION: rash    Family History: Family History  Problem Relation Age of Onset   Lung cancer Father        smoker, died at 36   Alcohol abuse Father  Cancer Father        bladder and lung CA smoker   Sudden death Other    Breast cancer Neg Hx    Stroke Neg Hx     Social History:   reports that she has never smoked. She has never used smokeless tobacco. She reports that she does not currently use alcohol. She reports that she does not use drugs.  Physical Exam: BP (!) 158/68   Pulse (!) 44   Ht  (1.651 m)   Wt 174 lb 2 oz (79 kg)   BMI 28.98 kg/m   Constitutional:  Alert and oriented, no acute distress, nontoxic appearing HEENT: [place], AT Cardiovascular: No clubbing, cyanosis, or edema Respiratory: Normal respiratory effort, no increased work of breathing Skin: No rashes, bruises or suspicious lesions Neurologic: Grossly intact, no focal deficits, moving all 4 extremities Psychiatric: Normal mood and affect  Laboratory Data: Results for orders placed or performed in visit on 07/16/22  Microscopic Examination   Urine  Result Value Ref Range   WBC, UA 0-5 0 - 5 /hpf   RBC, Urine 3-10 (A) 0 - 2 /hpf   Epithelial Cells (non renal) 0-10 0 - 10 /hpf   Bacteria, UA Few None seen/Few  Urinalysis, Complete  Result Value Ref Range   Specific Gravity, UA 1.015 1.005 - 1.030   pH, UA 5.0 5.0 - 7.5   Color, UA Yellow Yellow   Appearance Ur Clear Clear   Leukocytes,UA Negative Negative   Protein,UA Trace (A) Negative/Trace   Glucose, UA Negative Negative   Ketones, UA Negative Negative   RBC, UA 3+ (A) Negative   Bilirubin, UA Negative Negative   Urobilinogen, Ur 0.2 0.2 - 1.0 mg/dL   Nitrite, UA  Negative Negative   Microscopic Examination See below:    *Note: Due to a large number of results and/or encounters for the requested time period, some results have not been displayed. A complete set of results can be found in Results Review.   Assessment & Plan:   1. Asymptomatic microscopic hematuria We discussed that her 2 recent blood positive UA dips were not diagnostic for hematuria and likely represent false positives.  It is unclear if her recent urine microscopy was positive for microscopic hematuria, as this is defined by 3 or more RBCs/hpf and her results were 1-3 RBCs/hpf.  She does have true microscopic hematuria today, however this may be due to obtaining a catheterized urine sample in this patient on Coumadin.  With no reports of gross hematuria and no smoking history, I recommended a urine cytology today.  If positive, will pursue hematuria workup.  If negative, we will plan for repeat UA in 3 months.  He is in agreement with this plan. - Urinalysis, Complete - Cytology - non gyn  2. Urinary incontinence, unspecified type Minimal bother.  We briefly discussed consideration of pharmacotherapy, however she wishes to keep her medication list as short as possible and ultimately we decided to defer this.  Will continue to monitor.  Return in about 3 months (around 10/15/2022) for Repeat UA, Will call with results.  Carman Ching, PA-C  [facility] [place] [address], [address] [place], [place] [address] [phone]

## 2022-07-16 NOTE — Progress Notes (Signed)
In and Out Catheterization  Patient is present today for a I & O catheterization due to Hematuria. Patient was cleaned and prepped in a sterile fashion with betadine . A 14 FR cath was inserted no complications were noted , 100 ml of urine return was noted, urine was dark yellow in color. A clean urine sample was collected for Cath UA sample. Bladder was drained  And catheter was removed with out difficulty.    Performed by: Kimberely Mccannon H RMA  Follow up/ Additional notes:

## 2022-07-17 ENCOUNTER — Telehealth: Payer: Self-pay | Admitting: Internal Medicine

## 2022-07-17 NOTE — Telephone Encounter (Signed)
I helped the patient send a manual transmission. Transmission received 07/17/2022. I told her the nurse will review the transmission and someone will give her a call back.

## 2022-07-17 NOTE — Telephone Encounter (Signed)
Pt c/o of Chest Pain: STAT if CP now or developed within 24 hours  1. Are you having CP right now?   No.  Just twinge every now and then  2. Are you experiencing any other symptoms (ex. SOB, nausea, vomiting, sweating)?   Gets exhausted very quickly  3. How long have you been experiencing CP?   Since last night  4. Is your CP continuous or coming and going?   Coming and going  5. Have you taken Nitroglycerin?   No ?  Patient is concerned about the twinges in her chest.

## 2022-07-17 NOTE — Telephone Encounter (Signed)
Spoke with pt and advised pacemaker does show some heart palpitations (PVC's) and that may be what she is feeling.  Pt will plan to keep appointment with NP on Monday 04/29 for further evaluation and will present to ED if symptoms worsen over the weekend.  Will forward to NP for review.

## 2022-07-17 NOTE — Telephone Encounter (Signed)
Spoke with pt who complains of feeling intermittent "twinges" in or just above her left breast.  She states she noticed this when she rolled over in bed last night and more consistently this morning since getting up.  Discomfort feels more just below the surface.  Denies injury.  She states it almost feels like "pulsating"  Her last home remote check 2/24 was WNL.  She does not check her blood pressure at home but states it was very good when she was seen yesterday at her urology office.  She denies fever, chills, nausea, vomiting or diaphoresis.  Appointment scheduled with Suzann Riddle,NP for 07/20/2022 at 11am for further evaluation.  Will send to device clinic to assist with sending transmission.  Reviewed ED precautions.  Pt verbalizes understanding and agrees with current plan.

## 2022-07-17 NOTE — Telephone Encounter (Signed)
Transmission reviewed.  Presenting appears to be ventricular bigeminy. Pt has complete heart block/permanent afib and is programmed VVIR 70. Pt pacing percentage in the V is 91.3%-which would indicate she has a PVC burden roughly 8-9%.   She is not currently on a beta blocker. Will send back to covering nurse for follow up.

## 2022-07-17 NOTE — Progress Notes (Signed)
Cardiology Office Note Date:  07/20/2022  Patient ID:  [name], [name] 17-Dec-1932, MRN [id number] PCP:  [name], MD  Cardiologist:  Sherryl Manges, MD Electrophysiologist: None   Chief Complaint: chest discomfort  History of Present Illness: [name] is a 89 y.o. female with PMH notable for perm AFib, HFrecEF, VHD s/p miral valve repair, a/p AVN ablation and PPM implant, CVA, ; seen today for Dr. Graciela Husbands for acute visit due to "chest twinges".   She last saw PA Keitha Butte 03/2022, was doing well. Much better mentally and physically. Walking laps in house. Warfarin managed at PCP Patient called RN triage last week c/p twinges in chest and getting exhausted very quickly. Sent remote transmission that appears to be bigeminal PVCs  Today, she tells me that her chest twinges felt like fingers snaking across her L chest. She was lying in bed and was repositioning herself when the pain first started. No SOB, dizziness, pre-syncope during pain. She went to sleep, and then woke up the following morning and felt the chest twinges again. This prompted her to call the device clinic.   Other than this episode, she has felt very well lately.   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 s/p PVI [name] 2010;  b. on tikosyn/coumadin;  c. 05/2009 Echo: EF 60-65%, Gr 2 DD. (first dx 09/ 2007)   Bilateral lower extremity edema    Bleeding hemorrhoid    Carotid stenosis    mild (hosp 3/11)- consult by vasc/ Dr Arbie Cookey   Complication of anesthesia    hard to wake    Diverticulosis of colon    Dyspnea    on exertion-climbing stairs   Fatty liver    H/O cardiac radiofrequency ablation    01/ 2008 at [place] [facility] [place] /  03/ 2010  at [facility] failure with preserved ejection fraction [facility])    History of adenomatous polyp of colon    tubular adenoma's   History of cardiomyopathy    secondary tachycardia-induced cardiomyopathy -- resolved 2014   History of squamous cell carcinoma in situ (SCCIS) of skin    05/ 2017  nasal bridge and right medial knee   History of transient ischemic attack (TIA)    01-24-2005 and 06-12-2009   Hyperlipidemia    Hypothyroidism    Mild intermittent asthma    reacts to cats   Mixed stress and urge urinary incontinence    Presence of permanent cardiac pacemaker    was put in 07/2019   Pulmonary nodule    S/P AV nodal ablation 07/28/19 07/29/2019   S/P mitral valve repair 10-23-1998  dr Elsie Ra at [facility]   for MVP and regurg. (annuloplasty ring procedure)   S/P placement of cardiac pacemaker MDT 07/28/19 07/29/2019    Past Surgical History:  Procedure Laterality Date   APPENDECTOMY  1978   AV NODE ABLATION N/A 07/28/2019   Procedure: AV NODE ABLATION;  Surgeon: [name] Salvia, MD;  Location: [facility] INVASIVE CV LAB;  Service: Cardiovascular;  Laterality: N/A;   BUBBLE STUDY  06/19/2019   Procedure: BUBBLE STUDY;  Surgeon: Chrystie Nose, MD;  Location: [facility] ENDOSCOPY;  Service: Cardiovascular;;   CARDIAC ELECTROPHYSIOLOGY MAPPING AND ABLATION  01/ 2008    at [place] [facility] [place]   right-sided ablation atrial flutter   CARDIAC ELECTROPHYSIOLOGY STUDY AND ABLATION  03/ 2010   dr Barrett Shell at [facility]   AV node ablation and pulmonary vein isolation for atrial fib   CARDIOVERSION  06-18-2006;  07-13-2006;  10-19-2010;  10-27-2010   CATARACT EXTRACTION W/PHACO Right 04/09/2021   Procedure: CATARACT EXTRACTION PHACO AND INTRAOCULAR LENS PLACEMENT (IOC) RIGHT 6.71 01:11.1;  Surgeon: Lockie Mola, MD;  Location:  [facility] SURGERY CNTR;  Service: Ophthalmology;  Laterality: Right;   CATARACT EXTRACTION W/PHACO Left 04/23/2021   Procedure: CATARACT EXTRACTION PHACO AND INTRAOCULAR LENS PLACEMENT (IOC) LEFT;  Surgeon: Lockie Mola, MD;  Location: [facility] SURGERY [facility];  Service: Ophthalmology;  Laterality: Left;  [place] 5.65 00:50.1   COLONOSCOPY     COLONOSCOPY WITH PROPOFOL N/A 10/13/2017   Procedure: COLONOSCOPY WITH PROPOFOL;  Surgeon: Wyline Mood, MD;  Location: [facility] ENDOSCOPY;  Service: Gastroenterology;  Laterality: N/A;   COLONOSCOPY WITH PROPOFOL N/A 02/20/2020   Procedure: COLONOSCOPY WITH PROPOFOL;  Surgeon: Regis Bill, MD;  Location: [facility] ENDOSCOPY;  Service: Endoscopy;  Laterality: N/A;   CYSTO/ TRANSURETHRAL COLLAGEN INJECTION THERAPY  07-26-2007   dr Sherron Monday   DILATION AND CURETTAGE OF UTERUS     ESOPHAGOGASTRODUODENOSCOPY (EGD) WITH PROPOFOL N/A 10/13/2017   Procedure: ESOPHAGOGASTRODUODENOSCOPY (EGD) WITH PROPOFOL;  Surgeon: Wyline Mood, MD;  Location: [facility] ENDOSCOPY;  Service: Gastroenterology;  Laterality: N/A;   EVALUATION UNDER ANESTHESIA WITH HEMORRHOIDECTOMY N/A 04/09/2020   Procedure: EXAM UNDER ANESTHESIA WITH HEMORRHOIDECTOMY;  Surgeon: Leafy Ro, MD;  Location: [facility] ORS;  Service: General;  Laterality: N/A;   EXCISIONAL HEMORRHOIDECTOMY  1980s   GIVENS CAPSULE STUDY N/A 12/08/2017   Procedure: GIVENS CAPSULE STUDY;  Surgeon: Wyline Mood, MD;  Location: [facility] ENDOSCOPY;  Service: Gastroenterology;  Laterality: N/A;   HEMORRHOID SURGERY N/A 10/29/2016   Procedure: HEMORRHOIDECTOMY;  Surgeon: Romie Levee, MD;  Location: [facility];  Service: General;  Laterality: N/A;   MITRAL VALVE ANNULOPLASTY  10/23/1998   "Model [id number]; Ronni Rumble [id number]"; size 32mm; [facility]; Dr. Elsie Ra   PACEMAKER IMPLANT N/A 07/28/2019   Procedure: PACEMAKER IMPLANT;  Surgeon: [name] Salvia, MD;  Location: [facility] INVASIVE CV LAB;  Service: Cardiovascular;  Laterality: N/A;    PILONIDAL CYST EXCISION  1954   TEE WITH CARDIOVERSION  05-06-2006 at [facility];  01-02-2013 at [facility]   TEE WITHOUT CARDIOVERSION N/A 06/19/2019   Procedure: TRANSESOPHAGEAL ECHOCARDIOGRAM (TEE);  Surgeon: Chrystie Nose, MD;  Location: [facility] ENDOSCOPY;  Service: Cardiovascular;  Laterality: N/A;   TOTAL HIP ARTHROPLASTY Left 05/04/2017   Procedure: LEFT TOTAL HIP ARTHROPLASTY ANTERIOR APPROACH;  Surgeon: Kathryne Hitch, MD;  Location: [facility] OR;  Service: Orthopedics;  Laterality: Left;   TRANSTHORACIC ECHOCARDIOGRAM  05-01-2015   dr Graciela Husbands   ef 50-55%/  mild AV sclerosis without stenosis/  post MV repair with mild central MR (valve area by pressure half-time 2cm^2,  valve area by continutity equation 0.91cm^2, peak grandiant 23mmHg)/  severe LAE/ mild TR/ mild RAE    TUBAL LIGATION Bilateral 1978    Current Outpatient Medications  Medication Instructions   albuterol (VENTOLIN HFA) 108 (90 Base) MCG/ACT inhaler Inhalation, Every 6 hours PRN   cetirizine (ZYRTEC) 5 mg,  Oral, Daily PRN   diphenhydrAMINE-zinc acetate (BENADRYL) cream 1 application , Topical, 3 times daily PRN   ferrous sulfate 325 (65 FE) MG EC tablet TAKE 1 TABLET EVERY DAY WITH BREAKFAST   fluticasone (FLONASE) 50 MCG/ACT nasal spray 1 spray, Each Nare, 2 times daily PRN   furosemide (LASIX) 40 mg, Oral, Daily PRN   levothyroxine (SYNTHROID) 75 MCG tablet TAKE 1 TABLET EVERY DAY BEFORE BREAKFAST   metoprolol succinate (TOPROL XL) 25 mg, Oral, Nightly   Polyethyl Glycol-Propyl Glycol 0.4-0.3 % SOLN 1-2 drops, Both Eyes, 3 times daily PRN   triamcinolone cream (KENALOG) 0.5 % 1 Application, Topical, 2 times daily PRN   VITAMIN D PO Oral, Daily   warfarin (COUMADIN) 5 MG tablet TAKE 1 TABLET DAILY EXCEPT TAKE 1 AND 1/2 TABLETS ON WEDNESDAYS OR AS DIRECTED BY COUMADIN CLINIC    Social History:  The patient  reports that she has never smoked. She has never used smokeless tobacco. She reports that she does not currently use alcohol.  She reports that she does not use drugs.   Family History:  The patient's family history includes Alcohol abuse in her father; Cancer in her father; Lung cancer in her father; Sudden death in an other family member.  ROS:  Please see the history of present illness. All other systems are reviewed and otherwise negative.   PHYSICAL EXAM:  VS:  BP 130/70 (BP Location: Left Arm, Patient Position: Sitting, Cuff Size: Normal)   Pulse 92   Ht 5\' 5"  (1.651 m)   Wt 171 lb (77.6 kg)   SpO2 98%   BMI 28.46 kg/m  BMI: Body mass index is 28.46 kg/m.  GEN- The patient is well appearing, alert and oriented x 3 today.   Lungs- Clear to ausculation bilaterally, normal work of breathing.  Heart- Irregularly irregular rate and rhythm, no murmurs, rubs or gallops Extremities- 1+ peripheral edema, warm, dry Skin-   device pocket well-healed  Device interrogation done today and reviewed by myself:  Battery good Lead threshold, impedence, sensing stable  Programmed VVI d/t perm AF Presents in bigeminal PVCs Of note, during sensing test, patient had no PVCs VP has decreased to 90.9% No arrhyhtmia episodes No changes made today  EKG is ordered. Personal review of EKG from today shows:  Bigeminal PVCs, rate 92bpm  03/02/2022 - V-paced, rate 75bpm NO PVCs  Recent Labs: 10/08/2021: Magnesium 2.2 03/02/2022: B Natriuretic Peptide 197.7 07/03/2022: ALT 38; BUN 22; Creatinine, Ser 0.87; Hemoglobin 13.0; Platelets 124.0; Potassium 4.9; Sodium 141; TSH 3.24  03/12/2022: Cholesterol 194; HDL 34.80; LDL Cholesterol 134; Total CHOL/HDL Ratio 6; Triglycerides 125.0; VLDL 25.0   Estimated Creatinine Clearance: 46.9 mL/min (by C-G formula based on SCr of 0.87 mg/dL).   Wt Readings from Last 3 Encounters:  07/20/22 171 lb (77.6 kg)  07/16/22 174 lb 2 oz (79 kg)  07/03/22 174 lb 6 oz (79.1 kg)     Additional studies reviewed include: Previous EP, cardiology notes.    1. Left ventricular ejection  fraction, by estimation, is 55 to 60%. The left ventricle has normal function. The left ventricle has no regional wall motion abnormalities. Left ventricular diastolic function could not be evaluated.   2. Right ventricular systolic function is normal. The right ventricular size is normal. There is mildly elevated pulmonary artery systolic pressure. The estimated right ventricular systolic pressure is 37.1 mmHg.   3. Left atrial size was severely dilated.   4. A mitral valve annuloplasty ring is present.  There is a heavily calcified focal mass-like structure present on the PMVL. This is likely deterioration of the PMVL with mitral annular calcification. There are no mobile components to it. There is no  apparent destruction of the valve. There is mild to moderate MR. There is mild mitral stenosis. This is best viewed in the PLAX. When compared with the prior echo this was present but not specifically commented on. The mitral valve has been repaired/replaced. Mild to moderate mitral valve regurgitation. The mean mitral valve gradient is 6.3 mmHg with average heart rate of 80 bpm. There is a prosthetic annuloplasty ring present in the mitral position. Procedure Date: 10/23/1998.   5. The aortic valve is tricuspid. Aortic valve regurgitation is not visualized. Mild aortic valve sclerosis is present, with no evidence of aortic valve stenosis.   6. The inferior vena cava is dilated in size with <50% respiratory variability, suggesting right atrial pressure of 15 mmHg.   Comparison(s): A prior study was performed on 12/01/2016. No significant change from prior study.   ASSESSMENT AND PLAN:  #) chest pain #) new PVCs #) perm AFib #) PPM in situ Unclear cause of recent chest pain episodes Ventricular lead measurements all stable No ventricular arrhythmia to correspond to symptoms On Warfarin for stroke prophylaxis, managed at PCP Significantly elevated PVC burden compared to 02/2022 EKG - updated echo, if  LVEF normal, will proceed with lexican; if abnormal, LHC - chest x-ray to further eval lead - labs today     Current medicines are reviewed at length with the patient today.   The patient does not have concerns regarding her medicines.  The following changes were made today:   START toprol 25mg  nightly  Labs/ tests ordered today include:  Orders Placed This Encounter  Procedures   DG Chest 2 View   TSH   T4, free   Comp Met (CMET)   CBC   Protime-INR   EKG 12-Lead   ECHOCARDIOGRAM COMPLETE     Disposition: Follow up with EP APP in  1-2 months    Signed, Sherie Don, NP  07/20/22  11:48 AM  Electrophysiology [facility]

## 2022-07-17 NOTE — Telephone Encounter (Signed)
Patient called with chest pain, but the line was disconnected. Tried to call the patient back but the phone went straight to VM and the VM is not yet set up. ?

## 2022-07-20 ENCOUNTER — Ambulatory Visit: Payer: Medicare Other | Attending: Cardiology | Admitting: Cardiology

## 2022-07-20 ENCOUNTER — Encounter: Payer: Self-pay | Admitting: Cardiology

## 2022-07-20 ENCOUNTER — Other Ambulatory Visit
Admission: RE | Admit: 2022-07-20 | Discharge: 2022-07-20 | Disposition: A | Discharge status: Home / Self Care | Payer: Medicare Other | Attending: Cardiology | Admitting: Cardiology

## 2022-07-20 VITALS — BP 130/70 | HR 92 | Ht 65.0 in | Wt 171.0 lb

## 2022-07-20 DIAGNOSIS — R079 Chest pain, unspecified: Secondary | ICD-10-CM | POA: Insufficient documentation

## 2022-07-20 DIAGNOSIS — I493 Ventricular premature depolarization: Secondary | ICD-10-CM | POA: Insufficient documentation

## 2022-07-20 DIAGNOSIS — I4821 Permanent atrial fibrillation: Secondary | ICD-10-CM | POA: Insufficient documentation

## 2022-07-20 DIAGNOSIS — Z950 Presence of cardiac pacemaker: Secondary | ICD-10-CM | POA: Insufficient documentation

## 2022-07-20 DIAGNOSIS — Z79899 Other long term (current) drug therapy: Secondary | ICD-10-CM | POA: Diagnosis not present

## 2022-07-20 LAB — CUP PACEART INCLINIC DEVICE CHECK
Date Time Interrogation Session: 20240429115926
Implantable Lead Connection Status: 753985
Implantable Lead Implant Date: 20210507
Implantable Lead Location: 753860
Implantable Lead Model: 5076
Implantable Pulse Generator Implant Date: 20210507

## 2022-07-20 LAB — COMPREHENSIVE METABOLIC PANEL
ALT: 28 U/L (ref 0–44)
AST: 27 U/L (ref 15–41)
Albumin: 4.1 g/dL (ref 3.5–5.0)
Alkaline Phosphatase: 46 U/L (ref 38–126)
Anion gap: 5 (ref 5–15)
BUN: 19 mg/dL (ref 8–23)
CO2: 30 mmol/L (ref 22–32)
Calcium: 9.1 mg/dL (ref 8.9–10.3)
Chloride: 106 mmol/L (ref 98–111)
Creatinine, Ser: 0.87 mg/dL (ref 0.44–1.00)
GFR, Estimated: >60 mL/min (ref >60)
Glucose, Bld: 99 mg/dL (ref 70–99)
Potassium: 4.6 mmol/L (ref 3.5–5.1)
Sodium: 141 mmol/L (ref 135–145)
Total Bilirubin: 0.9 mg/dL (ref 0.3–1.2)
Total Protein: 7.2 g/dL (ref 6.5–8.1)

## 2022-07-20 LAB — CBC
HCT: 41.8 % (ref 36.0–46.0)
Hemoglobin: 13.2 g/dL (ref 12.0–15.0)
MCH: 28.2 pg (ref 26.0–34.0)
MCHC: 31.6 g/dL (ref 30.0–36.0)
MCV: 89.3 fL (ref 80.0–100.0)
Platelets: 147 10*3/uL — ABNORMAL LOW (ref 150–400)
RBC: 4.68 MIL/uL (ref 3.87–5.11)
RDW: 14.6 % (ref 11.5–15.5)
WBC: 4.5 10*3/uL (ref 4.0–10.5)
nRBC: 0 % (ref 0.0–0.2)

## 2022-07-20 LAB — T4, FREE: Free T4: 0.99 ng/dL (ref 0.61–1.12)

## 2022-07-20 LAB — CYTOLOGY - NON PAP

## 2022-07-20 LAB — TSH: TSH: 3.161 u[IU]/mL (ref 0.350–4.500)

## 2022-07-20 LAB — PROTIME-INR
INR: 2.4 — ABNORMAL HIGH (ref 0.8–1.2)
Prothrombin Time: 26.1 seconds — ABNORMAL HIGH (ref 11.4–15.2)

## 2022-07-20 MED ORDER — METOPROLOL SUCCINATE ER 25 MG PO TB24: 25.0000 mg | ORAL_TABLET | Freq: Every evening | ORAL | 0 refills | Status: DC

## 2022-07-20 NOTE — Patient Instructions (Signed)
Medication Instructions:  START Toprol 25 mg by mouth at night.  *If you need a refill on your cardiac medications before your next appointment, please call your pharmacy*   Lab Work: CBC, CMP, TSH, Free T4, and PT INR - Please go to the [facility]. You will check in at the front desk to the right as you walk into the atrium. Valet Parking is offered if needed. - No appointment needed. You may go any day between 7 am and 6 pm.  If you have labs (blood work) drawn today and your tests are completely normal, you will receive your results only by: MyChart Message (if you have MyChart) OR A paper copy in the mail If you have any lab test that is abnormal or we need to change your treatment, we will call you to review the results.   Testing/Procedures: chest X-ray  Your physician has requested that you have an echocardiogram. Echocardiography is a painless test that uses sound waves to create images of your heart. It provides your doctor with information about the size and shape of your heart and how well your heart's chambers and valves are working. This procedure takes approximately one hour. There are no restrictions for this procedure. Please do NOT wear cologne, perfume, aftershave, or lotions (deodorant is allowed). Please arrive 15 minutes prior to your appointment time.  Follow-Up: At [facility], you and your health needs are our priority.  As part of our continuing mission to provide you with exceptional heart care, we have created designated Provider Care Teams.  These Care Teams include your primary Cardiologist (physician) and Advanced Practice Providers (APPs -  Physician Assistants and Nurse Practitioners) who all work together to provide you with the care you need, when you need it.  We recommend signing up for the patient portal called "MyChart".  Sign up information is provided on this After Visit Summary.  MyChart is used to connect with patients for Virtual  Visits (Telemedicine).  Patients are able to view lab/test results, encounter notes, upcoming appointments, etc.  Non-urgent messages can be sent to your provider as well.   To learn more about what you can do with MyChart, go to [email or web address].    Your next appointment:   4-6 week(s) after echo  Provider:   Sherie Don, NP

## 2022-07-23 ENCOUNTER — Other Ambulatory Visit: Payer: Self-pay | Admitting: Family Medicine

## 2022-07-23 ENCOUNTER — Ambulatory Visit
Admission: RE | Admit: 2022-07-23 | Discharge: 2022-07-23 | Disposition: A | Discharge status: Home / Self Care | Payer: Medicare Other | Source: Ambulatory Visit | Attending: Cardiology | Admitting: Cardiology

## 2022-07-23 ENCOUNTER — Ambulatory Visit
Admission: RE | Admit: 2022-07-23 | Discharge: 2022-07-23 | Disposition: A | Discharge status: Home / Self Care | Payer: Medicare Other | Attending: Cardiology | Admitting: Cardiology

## 2022-07-23 DIAGNOSIS — Z950 Presence of cardiac pacemaker: Secondary | ICD-10-CM

## 2022-07-23 DIAGNOSIS — R079 Chest pain, unspecified: Secondary | ICD-10-CM | POA: Insufficient documentation

## 2022-07-23 DIAGNOSIS — Z7901 Long term (current) use of anticoagulants: Secondary | ICD-10-CM

## 2022-07-23 NOTE — Telephone Encounter (Signed)
Routing to the coumadin nurse  

## 2022-07-23 NOTE — Telephone Encounter (Signed)
Pt is compliant with warfarin management and PCP apts.  Sent in refill of warfarin to requested pharmacy.      

## 2022-07-24 ENCOUNTER — Ambulatory Visit (INDEPENDENT_AMBULATORY_CARE_PROVIDER_SITE_OTHER): Payer: Medicare Other

## 2022-07-24 DIAGNOSIS — I4430 Unspecified atrioventricular block: Secondary | ICD-10-CM | POA: Diagnosis not present

## 2022-07-24 LAB — CUP PACEART REMOTE DEVICE CHECK
Battery Remaining Longevity: 100 mo
Battery Voltage: 3.01 V
Brady Statistic AP VP Percent: 0 %
Brady Statistic AP VS Percent: 0 %
Brady Statistic AS VP Percent: 91.09 %
Brady Statistic AS VS Percent: 8.91 %
Brady Statistic RA Percent Paced: 0 %
Brady Statistic RV Percent Paced: 91.09 %
Date Time Interrogation Session: 20240503041150
Implantable Lead Connection Status: 753985
Implantable Lead Implant Date: 20210507
Implantable Lead Location: 753860
Implantable Lead Model: 5076
Implantable Pulse Generator Implant Date: 20210507
Lead Channel Impedance Value: 3306 Ohm
Lead Channel Impedance Value: 3306 Ohm
Lead Channel Impedance Value: 380 Ohm
Lead Channel Impedance Value: 418 Ohm
Lead Channel Pacing Threshold Amplitude: 0.625 V
Lead Channel Pacing Threshold Pulse Width: 0.4 ms
Lead Channel Sensing Intrinsic Amplitude: 1.875 mV
Lead Channel Sensing Intrinsic Amplitude: 7.625 mV
Lead Channel Sensing Intrinsic Amplitude: 7.625 mV
Lead Channel Setting Pacing Amplitude: 2.5 V
Lead Channel Setting Pacing Pulse Width: 0.4 ms
Lead Channel Setting Sensing Sensitivity: 4 mV
Zone Setting Status: 755011

## 2022-07-27 ENCOUNTER — Telehealth: Payer: Self-pay | Admitting: Family Medicine

## 2022-07-27 ENCOUNTER — Ambulatory Visit: Payer: Medicare Other | Admitting: Physician Assistant

## 2022-07-27 NOTE — Telephone Encounter (Signed)
Contacted pt and RS coumadin clinic apt for next Thursday.

## 2022-07-27 NOTE — Telephone Encounter (Signed)
Patient needs to reschedule their coumadin appt, has a graduation to attend  Possibly interested in other days this week  Please call the patient at [phone]

## 2022-07-30 ENCOUNTER — Ambulatory Visit: Payer: Medicare Other

## 2022-08-06 ENCOUNTER — Ambulatory Visit (INDEPENDENT_AMBULATORY_CARE_PROVIDER_SITE_OTHER): Payer: Medicare Other

## 2022-08-06 DIAGNOSIS — Z7901 Long term (current) use of anticoagulants: Secondary | ICD-10-CM | POA: Diagnosis not present

## 2022-08-06 LAB — POCT INR: INR: 3.9 — AB (ref 2.0–3.0)

## 2022-08-06 NOTE — Patient Instructions (Addendum)
Pre visit review using our clinic review tool, if applicable. No additional management support is needed unless otherwise documented below in the visit note.  Hold dose tomorrow and then change weekly dose to take 1 tablet daily except take 1/2 tablet on Wednesdays . Recheck in 3 weeks.

## 2022-08-06 NOTE — Progress Notes (Signed)
Pt reports some BLE this week but has been taking lasix. Advised if any worsening to let PCP know. Pt denies any bleeding. Advised of any s/s of bleeding to go to the ER. Pt verbalized understanding.  Pt already took warfarin dose today. Hold dose tomorrow and then change weekly dose to take 1 tablet daily except take 1/2 tablet on Wednesdays . Recheck in 3 weeks.

## 2022-08-11 ENCOUNTER — Other Ambulatory Visit: Payer: Self-pay | Admitting: Cardiology

## 2022-08-11 NOTE — Telephone Encounter (Signed)
last visit 07/20/2022--4-6 week(s) after echo next visit 09/16/22

## 2022-08-12 NOTE — Progress Notes (Signed)
Remote pacemaker transmission.   

## 2022-08-18 ENCOUNTER — Ambulatory Visit: Payer: Medicare Other | Attending: Cardiology

## 2022-08-18 DIAGNOSIS — I493 Ventricular premature depolarization: Secondary | ICD-10-CM | POA: Insufficient documentation

## 2022-08-18 DIAGNOSIS — R079 Chest pain, unspecified: Secondary | ICD-10-CM | POA: Diagnosis not present

## 2022-08-21 LAB — ECHOCARDIOGRAM COMPLETE
AR max vel: 2.73 cm2
AV Area VTI: 2.59 cm2
AV Area mean vel: 2.58 cm2
AV Mean grad: 2 mmHg
AV Peak grad: 4.2 mmHg
Ao pk vel: 1.02 m/s
Area-P 1/2: 2.05 cm2
MV VTI: 1.73 cm2
S' Lateral: 3.7 cm

## 2022-08-24 NOTE — Progress Notes (Signed)
X2 no answer/Unable to LVM to VM setup.

## 2022-08-24 NOTE — Progress Notes (Signed)
Unable to LVM due to no VM setup. No answer x1.

## 2022-08-25 ENCOUNTER — Ambulatory Visit: Payer: Medicare Other | Admitting: Cardiovascular Disease

## 2022-08-25 NOTE — Progress Notes (Signed)
No answer pt seeing Dr. Kirke Corin today.

## 2022-08-27 ENCOUNTER — Ambulatory Visit: Payer: Medicare Other

## 2022-08-28 ENCOUNTER — Telehealth: Payer: Self-pay

## 2022-08-28 NOTE — Telephone Encounter (Signed)
Pt cancelled coumadin clinic apt on 6/6. Contacted pt to RS and she reports her vehicle is being repaired and she is not sure when she will get it back. She is hoping early next week. She will call once she has transportation again.

## 2022-08-31 ENCOUNTER — Telehealth: Payer: Self-pay | Admitting: Internal Medicine

## 2022-08-31 NOTE — Telephone Encounter (Signed)
Patient wants to get handicap tag for her vehicle.

## 2022-09-02 NOTE — Telephone Encounter (Signed)
Spoke with pt and advised handicap application completed and placed in the mail to pt as requested.  Pt verbalizes understanding and thanked Charity fundraiser for the call.

## 2022-09-02 NOTE — Telephone Encounter (Signed)
Pt reports she has her vehicle back and can make apt for tomorrow. Scheduled for 8:30. Pt agreed.

## 2022-09-03 ENCOUNTER — Ambulatory Visit (INDEPENDENT_AMBULATORY_CARE_PROVIDER_SITE_OTHER): Payer: Medicare Other

## 2022-09-03 DIAGNOSIS — Z7901 Long term (current) use of anticoagulants: Secondary | ICD-10-CM | POA: Diagnosis not present

## 2022-09-03 LAB — POCT INR: INR: 3.3 — AB (ref 2.0–3.0)

## 2022-09-03 NOTE — Progress Notes (Addendum)
Pt reports some BLE this week but has been taking lasix. Advised if any worsening to let PCP know. Pt denies any bleeding. Advised of any s/s of bleeding to go to the ER. Pt verbalized understanding.  Pt already took warfarin dose today. Hold dose tomorrow and then change weekly dose to take 1 tablet daily except take 1/2 tablet on Sundays and Wednesdays . Recheck in 2 weeks.

## 2022-09-03 NOTE — Patient Instructions (Addendum)
Pre visit review using our clinic review tool, if applicable. No additional management support is needed unless otherwise documented below in the visit note.  Hold dose tomorrow and then change weekly dose to take 1 tablet daily except take 1/2 tablet on Sundays and Wednesdays . Recheck in 2 weeks.

## 2022-09-15 NOTE — Progress Notes (Unsigned)
Cardiology Office Note Date:  09/16/2022  Patient ID:  [name], [name] Mar 15, 1933, MRN [id number] PCP:  [name] Pimple, MD  Cardiologist:  None Electrophysiologist: [name] Manges, MD  Chief Complaint: fatigue  History of Present Illness: [name] is a 89 y.o. female with PMH notable for perm AFib, HFrecEF, VHD s/p miral valve repair, a/p AVN ablation and PPM implant, CVA, ; seen today for Dr. Graciela Husbands for routine EP follow-up.   I saw her about 2 months ago for acute visit d/t "chest twinges" snaking across her L chest and increased fatigue. She was found to be in bigeminal PVCs in office. Toprol was started at that visit, and I had recommended an updated echo to further eval, which revealed newly reduced LVEF. She was planned to see Dr. Kirke Corin for gen card consult, but had to cancel appt d/t car trouble.  Today, she continues to feel fatigued, feels as though she is a shell of her former self. Tries to keep as active as she can, but takes more frequent naps than she ever has. She has a 422ft driveway with a slight incline, and often has to stop to rest during the walk d/t fatigue.  No overt chest pain or pressure. She does not recall any further "chest twinges" she previously described.   On warfarin for stroke ppx, managed at PCP office. No bleeding concerns.   She denies chest pain, palpitations, PND, orthopnea, nausea, vomiting, dizziness, syncope, edema, weight gain, or early satiety.      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 s/p PVI  [name] 2010;  b. on tikosyn/coumadin;  c. 05/2009 Echo: EF 60-65%, Gr 2 DD. (first dx 09/ 2007)   Bilateral lower extremity edema    Bleeding hemorrhoid    Carotid stenosis    mild (hosp 3/11)- consult by vasc/ Dr Arbie Cookey   Complication of anesthesia    hard to wake   Diverticulosis of colon    Dyspnea    on exertion-climbing stairs   Fatty liver    H/O cardiac radiofrequency ablation    01/ 2008 at [place] [facility] [place] /  03/ 2010  at [facility] failure with preserved ejection fraction [place])    History of adenomatous polyp of colon    tubular adenoma's   History of cardiomyopathy    secondary tachycardia-induced cardiomyopathy -- resolved 2014   History of squamous cell carcinoma in situ (SCCIS) of skin    05/ 2017  nasal bridge and right medial knee   History of transient ischemic attack (TIA)    01-24-2005 and 06-12-2009   Hyperlipidemia    Hypothyroidism    Mild intermittent asthma    reacts to cats   Mixed stress and urge urinary incontinence    Presence of permanent cardiac pacemaker    was put in 07/2019   Pulmonary nodule    S/P AV nodal ablation 07/28/19 07/29/2019   S/P mitral valve repair 10-23-1998  dr Elsie Ra at [facility]   for MVP and regurg. (annuloplasty ring procedure)   S/P placement of cardiac pacemaker MDT 07/28/19 07/29/2019    Past Surgical History:  Procedure Laterality Date   APPENDECTOMY  1978   AV NODE ABLATION N/A 07/28/2019   Procedure: AV NODE ABLATION;  Surgeon: [name] Salvia, MD;  Location: [facility] INVASIVE CV LAB;  Service: Cardiovascular;  Laterality: N/A;   BUBBLE STUDY  06/19/2019   Procedure: BUBBLE STUDY;  Surgeon: Chrystie Nose, MD;  Location: [facility] ENDOSCOPY;  Service: Cardiovascular;;   CARDIAC ELECTROPHYSIOLOGY MAPPING AND ABLATION  01/ 2008    at [place] [facility] [place]   right-sided ablation atrial flutter   CARDIAC ELECTROPHYSIOLOGY STUDY AND ABLATION  03/ 2010   dr Barrett Shell at [facility]   AV node ablation and pulmonary vein isolation for  atrial fib   CARDIOVERSION  06-18-2006;  07-13-2006;  10-19-2010;  10-27-2010   CATARACT EXTRACTION W/PHACO Right 04/09/2021   Procedure: CATARACT EXTRACTION PHACO AND INTRAOCULAR LENS PLACEMENT (IOC) RIGHT 6.71 01:11.1;  Surgeon: Lockie Mola, MD;  Location[facility];  Service: Ophthalmology;  Laterality: Right;   CATARACT EXTRACTION W/PHACO Left 04/23/2021   Procedure: CATARACT EXTRACTION PHACO AND INTRAOCULAR LENS PLACEMENT (IOC) LEFT;  Surgeon: Lockie Mola, MD;  Location: [facility];  Service: Ophthalmology;  Laterality: Left;  [place] 5.65 00:50.1   COLONOSCOPY     COLONOSCOPY WITH PROPOFOL N/A 10/13/2017   Procedure: COLONOSCOPY WITH PROPOFOL;  Surgeon: Wyline Mood, MD;  Location: [facility] ENDOSCOPY;  Service: Gastroenterology;  Laterality: N/A;   COLONOSCOPY WITH PROPOFOL N/A 02/20/2020   Procedure: COLONOSCOPY WITH PROPOFOL;  Surgeon: Regis Bill, MD;  Location: [facility] ENDOSCOPY;  Service: Endoscopy;  Laterality: N/A;   CYSTO/ TRANSURETHRAL COLLAGEN INJECTION THERAPY  07-26-2007   dr Sherron Monday   DILATION AND CURETTAGE OF UTERUS     ESOPHAGOGASTRODUODENOSCOPY (EGD) WITH PROPOFOL N/A 10/13/2017   Procedure: ESOPHAGOGASTRODUODENOSCOPY (EGD) WITH PROPOFOL;  Surgeon: Wyline Mood, MD;  Location[facility] ENDOSCOPY;  Service: Gastroenterology;  Laterality: N/A;   EVALUATION UNDER ANESTHESIA WITH HEMORRHOIDECTOMY N/A 04/09/2020   Procedure: EXAM UNDER ANESTHESIA WITH HEMORRHOIDECTOMY;  Surgeon: Leafy Ro, MD;  Location: [facility] ORS;  Service: General;  Laterality: N/A;   EXCISIONAL HEMORRHOIDECTOMY  1980s   GIVENS CAPSULE STUDY N/A 12/08/2017   Procedure: GIVENS CAPSULE STUDY;  Surgeon: Wyline Mood, MD;  Location: [facility] ENDOSCOPY;  Service: Gastroenterology;  Laterality: N/A;   HEMORRHOID SURGERY N/A 10/29/2016   Procedure: HEMORRHOIDECTOMY;  Surgeon: Romie Levee, MD;  Location: [facility];  Service: General;  Laterality: N/A;   MITRAL VALVE  ANNULOPLASTY  10/23/1998   "Model [id number]; Ronni Rumble [id number]"; size 32mm; [facility]; Dr. Elsie Ra   PACEMAKER IMPLANT N/A 07/28/2019   Procedure: PACEMAKER IMPLANT;  Surgeon: [name] Salvia, MD;  Location: [facility] INVASIVE CV LAB;  Service: Cardiovascular;  Laterality: N/A;   PILONIDAL CYST EXCISION  1954   TEE WITH CARDIOVERSION  05-06-2006 at [facility];  01-02-2013 at [facility]   TEE WITHOUT CARDIOVERSION N/A 06/19/2019   Procedure: TRANSESOPHAGEAL ECHOCARDIOGRAM (TEE);  Surgeon: Chrystie Nose, MD;  Location: [facility] ENDOSCOPY;  Service: Cardiovascular;  Laterality: N/A;   TOTAL HIP ARTHROPLASTY Left 05/04/2017   Procedure: LEFT TOTAL HIP ARTHROPLASTY ANTERIOR APPROACH;  Surgeon: Kathryne Hitch, MD;  Location: [facility] OR;  Service: Orthopedics;  Laterality: Left;   TRANSTHORACIC ECHOCARDIOGRAM  05-01-2015   dr Graciela Husbands   ef 50-55%/  mild AV sclerosis without stenosis/  post MV repair with mild central MR (valve area by pressure half-time  2cm^2,  valve area by continutity equation 0.91cm^2, peak grandiant 31mmHg)/  severe LAE/ mild TR/ mild RAE    TUBAL LIGATION Bilateral 1978    Current Outpatient Medications  Medication Instructions   albuterol (VENTOLIN HFA) 108 (90 Base) MCG/ACT inhaler Inhalation, Every 6 hours PRN   bisoprolol (ZEBETA) 2.5 mg, Oral, Daily   cetirizine (ZYRTEC) 5 mg, Oral, Daily PRN   diphenhydrAMINE-zinc acetate (BENADRYL) cream 1 application , Topical, 3 times daily PRN   ferrous sulfate 325 (65 FE) MG EC tablet TAKE 1 TABLET EVERY DAY WITH BREAKFAST   fluticasone (FLONASE) 50 MCG/ACT nasal spray 1 spray, Each Nare, 2 times daily PRN   furosemide (LASIX) 40 mg, Oral, Daily PRN   levothyroxine (SYNTHROID) 75 MCG tablet TAKE 1 TABLET EVERY DAY BEFORE BREAKFAST   Polyethyl Glycol-Propyl Glycol 0.4-0.3 % SOLN 1-2 drops, Both Eyes, 3 times daily PRN   triamcinolone cream (KENALOG) 0.5 % 1 Application, Topical, 2 times daily PRN   VITAMIN D PO Oral, Daily   warfarin (COUMADIN) 5 MG tablet  TAKE 1 TABLET DAILY OR AS DIRECTED BY COUMADIN CLINIC    Social History:  The patient  reports that she has never smoked. She has never used smokeless tobacco. She reports that she does not currently use alcohol. She reports that she does not use drugs.   Family History:  The patient's family history includes Alcohol abuse in her father; Cancer in her father; Lung cancer in her father; Sudden death in an other family member.  ROS:  Please see the history of present illness. All other systems are reviewed and otherwise negative.   PHYSICAL EXAM:  VS:  BP (!) 140/80 (BP Location: Left Arm, Patient Position: Sitting, Cuff Size: Normal)   Pulse 79   Ht 5\' 5"  (1.651 m)   Wt 173 lb (78.5 kg)   SpO2 97%   BMI 28.79 kg/m  BMI: Body mass index is 28.79 kg/m.  GEN- The patient is well appearing, alert and oriented x 3 today.   Lungs- Clear to ausculation bilaterally, normal work of breathing.  Heart- Irregularly irregular rate and rhythm, no murmurs, rubs or gallops Extremities- Trace peripheral edema, warm, dry Skin-   device pocket well-healed, no tethering   Device interrogation done today and reviewed by myself:  Battery 8.1 years Lead thresholds, impedence, sensing stable  No episodes No changes made today  EKG is ordered. Personal review of EKG from today shows: EKG Interpretation  Date/Time:  Wednesday September 16 2022 10:09:11 EDT Ventricular Rate:  79 PR Interval:    QRS Duration: 178 QT Interval:  434 QTC Calculation: 497 R Axis:   -70 Text Interpretation: Ventricular-paced rhythm with occasional Premature ventricular complexes When compared with ECG of 02-Mar-2022 11:33, Premature ventricular complexes are now Present Vent. rate has increased BY   4 BPM Confirmed by Sherie Don [phone]) on 09/16/2022 12:12:04 PM    03/02/2022 - V-paced, rate 75bpm NO PVCs  Recent Labs: 10/08/2021: Magnesium 2.2 03/02/2022: B Natriuretic Peptide 197.7 07/20/2022: ALT 28; TSH  3.161 09/16/2022: BUN 28; Creatinine, Ser 0.82; Hemoglobin 13.4; Platelets 161; Potassium 4.0; Sodium 139  03/12/2022: Cholesterol 194; HDL 34.80; LDL Cholesterol 134; Total CHOL/HDL Ratio 6; Triglycerides 125.0; VLDL 25.0   Estimated Creatinine Clearance: 50.1 mL/min (by C-G formula based on SCr of 0.82 mg/dL).   Wt Readings from Last 3 Encounters:  09/16/22 173 lb (78.5 kg)  07/20/22 171 lb (77.6 kg)  07/16/22 174 lb 2 oz (79 kg)  Additional studies reviewed include: Previous EP, cardiology notes.   TTE, 08/18/2022  1. Left ventricular ejection fraction, by estimation, is 25 to 30%. The left ventricle has severely decreased function. The left ventricle demonstrates global hypokinesis. Left ventricular diastolic parameters are indeterminate.   2. Right ventricular systolic function is low normal. The right ventricular size is normal. There is moderately elevated pulmonary artery systolic pressure.   3. Left atrial size was severely dilated.   4. Right atrial size was moderately dilated.   5. The mitral valve has been repaired/replaced. Mild mitral valve regurgitation.   6. Tricuspid valve regurgitation is mild to moderate.   7. The aortic valve is tricuspid. Aortic valve regurgitation is not visualized. Aortic valve sclerosis/calcification is present, without any evidence of aortic stenosis.   8. The inferior vena cava is dilated in size with <50% respiratory variability, suggesting right atrial pressure of 15 mmHg.   Comparison(s): 06/10/19: 55-60%, MV annuloplasty ring (10/1998), RVSP 37, LAE, mild MS, m/m MR, mass on PMVL.   ASSESSMENT AND PLAN:  #) perm AFib #) PPM in situ #) s/p AVN ablation Pacemaker functioning well, see paceart for details Ventricular rates well-controlled, if not somewhat blunted - consider adjusting rate response at follow-up  #) Hypercoag d/t perm AFib CHA2DS2-VASc Score = 4 [CHF History: 1, HTN History: 0, Diabetes History: 0, Stroke History: 2,  Vascular Disease History: 0, Age Score: 2, Gender Score: 1].  Therefore, the patient's annual risk of stroke is 4.8 %. OAC - warfarin, mgmt at PCP office No bleeding concerns :1}    #) HFrEF #) fatigue Recent echo shows reduced EF compared to prior NYHA III symptoms Warm and dry on exam [facility] to further evaluate - CBC, BMET, INR today  GDMT: patient intolerant of metop d/t hair loss. Will trial bisoprolol - uptitrate of GDMT at follow-up   Informed Consent   Shared Decision Making/Informed Consent The risks [stroke (1 in 1000), death (1 in 1000), kidney failure [usually temporary] (1 in 500), bleeding (1 in 200), allergic reaction [possibly serious] (1 in 200)], benefits (diagnostic support and management of coronary artery disease) and alternatives of a cardiac catheterization were discussed in detail with Ms. Lundy and she is willing to proceed.      Current medicines are reviewed at length with the patient today.   The patient has concerns regarding her medicines.  The following changes were made today:   STOP toprol START bisprolol 2.5mg  daily  Labs/ tests ordered today include:  Orders Placed This Encounter  Procedures   CBC   Basic Metabolic Panel (BMET)   Protime-INR   EKG 12-Lead     Disposition: Follow up with Dr. Graciela Husbands or EP APP in 6 months  Follow-up with gen cards APP 2-3 wks after [facility]  Signed, Sherie Don, NP  09/16/22  12:16 PM  Electrophysiology [facility]    ADDENDUM With patient's history of CVA, she will require bridging with lovenox prior to [facility].  Discussed with Pharmacy, who provided a lovenox bridging schedule - in separate telephone note.  I attempted to call patient to discuss, did not reach her.  Mychart msg sent, and will call patient again tomorrow.

## 2022-09-15 NOTE — H&P (View-Only) (Signed)
Cardiology Office Note Date:  09/16/2022  Patient ID:  [name], [name] Mar 15, 1933, MRN [id number] PCP:  [name], MD  Cardiologist:  None Electrophysiologist: Sherryl Manges, MD  Chief Complaint: fatigue  History of Present Illness: [name] is a 89 y.o. female with PMH notable for perm AFib, HFrecEF, VHD s/p miral valve repair, a/p AVN ablation and PPM implant, CVA, ; seen today for Dr. Graciela Husbands for routine EP follow-up.   I saw her about 2 months ago for acute visit d/t "chest twinges" snaking across her L chest and increased fatigue. She was found to be in bigeminal PVCs in office. Toprol was started at that visit, and I had recommended an updated echo to further eval, which revealed newly reduced LVEF. She was planned to see Dr. Kirke Corin for gen card consult, but had to cancel appt d/t car trouble.  Today, she continues to feel fatigued, feels as though she is a shell of her former self. Tries to keep as active as she can, but takes more frequent naps than she ever has. She has a 422ft driveway with a slight incline, and often has to stop to rest during the walk d/t fatigue.  No overt chest pain or pressure. She does not recall any further "chest twinges" she previously described.   On warfarin for stroke ppx, managed at PCP office. No bleeding concerns.   She denies chest pain, palpitations, PND, orthopnea, nausea, vomiting, dizziness, syncope, edema, weight gain, or early satiety.      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 s/p PVI  [name] 2010;  b. on tikosyn/coumadin;  c. 05/2009 Echo: EF 60-65%, Gr 2 DD. (first dx 09/ 2007)   Bilateral lower extremity edema    Bleeding hemorrhoid    Carotid stenosis    mild (hosp 3/11)- consult by vasc/ Dr Arbie Cookey   Complication of anesthesia    hard to wake   Diverticulosis of colon    Dyspnea    on exertion-climbing stairs   Fatty liver    H/O cardiac radiofrequency ablation    01/ 2008 at [place] [facility] [place] /  03/ 2010  at [facility] failure with preserved ejection fraction [place])    History of adenomatous polyp of colon    tubular adenoma's   History of cardiomyopathy    secondary tachycardia-induced cardiomyopathy -- resolved 2014   History of squamous cell carcinoma in situ (SCCIS) of skin    05/ 2017  nasal bridge and right medial knee   History of transient ischemic attack (TIA)    01-24-2005 and 06-12-2009   Hyperlipidemia    Hypothyroidism    Mild intermittent asthma    reacts to cats   Mixed stress and urge urinary incontinence    Presence of permanent cardiac pacemaker    was put in 07/2019   Pulmonary nodule    S/P AV nodal ablation 07/28/19 07/29/2019   S/P mitral valve repair 10-23-1998  dr Elsie Ra at [facility]   for MVP and regurg. (annuloplasty ring procedure)   S/P placement of cardiac pacemaker MDT 07/28/19 07/29/2019    Past Surgical History:  Procedure Laterality Date   APPENDECTOMY  1978   AV NODE ABLATION N/A 07/28/2019   Procedure: AV NODE ABLATION;  Surgeon: [name] Salvia, MD;  Location: [facility] INVASIVE CV LAB;  Service: Cardiovascular;  Laterality: N/A;   BUBBLE STUDY  06/19/2019   Procedure: BUBBLE STUDY;  Surgeon: Chrystie Nose, MD;  Location: [facility] ENDOSCOPY;  Service: Cardiovascular;;   CARDIAC ELECTROPHYSIOLOGY MAPPING AND ABLATION  01/ 2008    at [place] [facility] [place]   right-sided ablation atrial flutter   CARDIAC ELECTROPHYSIOLOGY STUDY AND ABLATION  03/ 2010   dr Barrett Shell at [facility]   AV node ablation and pulmonary vein isolation for  atrial fib   CARDIOVERSION  06-18-2006;  07-13-2006;  10-19-2010;  10-27-2010   CATARACT EXTRACTION W/PHACO Right 04/09/2021   Procedure: CATARACT EXTRACTION PHACO AND INTRAOCULAR LENS PLACEMENT (IOC) RIGHT 6.71 01:11.1;  Surgeon: Lockie Mola, MD;  [facility];  Service: Ophthalmology;  Laterality: Right;   CATARACT EXTRACTION W/PHACO Left 04/23/2021   Procedure: CATARACT EXTRACTION PHACO AND INTRAOCULAR LENS PLACEMENT (IOC) LEFT;  Surgeon: Lockie Mola, MD;  Location: [facility];  Service: Ophthalmology;  Laterality: Left;  [place] 5.65 00:50.1   COLONOSCOPY     COLONOSCOPY WITH PROPOFOL N/A 10/13/2017   Procedure: COLONOSCOPY WITH PROPOFOL;  Surgeon: Wyline Mood, MD;  Location: [facility] ENDOSCOPY;  Service: Gastroenterology;  Laterality: N/A;   COLONOSCOPY WITH PROPOFOL N/A 02/20/2020   Procedure: COLONOSCOPY WITH PROPOFOL;  Surgeon: Regis Bill, MD;  Location: [facility] ENDOSCOPY;  Service: Endoscopy;  Laterality: N/A;   CYSTO/ TRANSURETHRAL COLLAGEN INJECTION THERAPY  07-26-2007   dr Sherron Monday   DILATION AND CURETTAGE OF UTERUS     ESOPHAGOGASTRODUODENOSCOPY (EGD) WITH PROPOFOL N/A 10/13/2017   Procedure: ESOPHAGOGASTRODUODENOSCOPY (EGD) WITH PROPOFOL;  Surgeon: Wyline Mood, MD;  Location: [facility] ENDOSCOPY;  Service: Gastroenterology;  Laterality: N/A;   EVALUATION UNDER ANESTHESIA WITH HEMORRHOIDECTOMY N/A 04/09/2020   Procedure: EXAM UNDER ANESTHESIA WITH HEMORRHOIDECTOMY;  Surgeon: Leafy Ro, MD;  Location: [facility] ORS;  Service: General;  Laterality: N/A;   EXCISIONAL HEMORRHOIDECTOMY  1980s   GIVENS CAPSULE STUDY N/A 12/08/2017   Procedure: GIVENS CAPSULE STUDY;  Surgeon: Wyline Mood, MD;  Location: [facility] ENDOSCOPY;  Service: Gastroenterology;  Laterality: N/A;   HEMORRHOID SURGERY N/A 10/29/2016   Procedure: HEMORRHOIDECTOMY;  Surgeon: Romie Levee, MD;  Location: [facility];  Service: General;  Laterality: N/A;   MITRAL VALVE  ANNULOPLASTY  10/23/1998   "Model [id number]; Ronni Rumble [id number]"; size 32mm; [facility]; Dr. Elsie Ra   PACEMAKER IMPLANT N/A 07/28/2019   Procedure: PACEMAKER IMPLANT;  Surgeon: [name] Salvia, MD;  Location: [facility] INVASIVE CV LAB;  Service: Cardiovascular;  Laterality: N/A;   PILONIDAL CYST EXCISION  1954   TEE WITH CARDIOVERSION  05-06-2006 at [facility];  01-02-2013 at [facility]   TEE WITHOUT CARDIOVERSION N/A 06/19/2019   Procedure: TRANSESOPHAGEAL ECHOCARDIOGRAM (TEE);  Surgeon: Chrystie Nose, MD;  Location: [facility] ENDOSCOPY;  Service: Cardiovascular;  Laterality: N/A;   TOTAL HIP ARTHROPLASTY Left 05/04/2017   Procedure: LEFT TOTAL HIP ARTHROPLASTY ANTERIOR APPROACH;  Surgeon: Kathryne Hitch, MD;  Location: [facility] OR;  Service: Orthopedics;  Laterality: Left;   TRANSTHORACIC ECHOCARDIOGRAM  05-01-2015   dr Graciela Husbands   ef 50-55%/  mild AV sclerosis without stenosis/  post MV repair with mild central MR (valve area by pressure half-time  2cm^2,  valve area by continutity equation 0.91cm^2, peak grandiant 31mmHg)/  severe LAE/ mild TR/ mild RAE    TUBAL LIGATION Bilateral 1978    Current Outpatient Medications  Medication Instructions   albuterol (VENTOLIN HFA) 108 (90 Base) MCG/ACT inhaler Inhalation, Every 6 hours PRN   bisoprolol (ZEBETA) 2.5 mg, Oral, Daily   cetirizine (ZYRTEC) 5 mg, Oral, Daily PRN   diphenhydrAMINE-zinc acetate (BENADRYL) cream 1 application , Topical, 3 times daily PRN   ferrous sulfate 325 (65 FE) MG EC tablet TAKE 1 TABLET EVERY DAY WITH BREAKFAST   fluticasone (FLONASE) 50 MCG/ACT nasal spray 1 spray, Each Nare, 2 times daily PRN   furosemide (LASIX) 40 mg, Oral, Daily PRN   levothyroxine (SYNTHROID) 75 MCG tablet TAKE 1 TABLET EVERY DAY BEFORE BREAKFAST   Polyethyl Glycol-Propyl Glycol 0.4-0.3 % SOLN 1-2 drops, Both Eyes, 3 times daily PRN   triamcinolone cream (KENALOG) 0.5 % 1 Application, Topical, 2 times daily PRN   VITAMIN D PO Oral, Daily   warfarin (COUMADIN) 5 MG tablet  TAKE 1 TABLET DAILY OR AS DIRECTED BY COUMADIN CLINIC    Social History:  The patient  reports that she has never smoked. She has never used smokeless tobacco. She reports that she does not currently use alcohol. She reports that she does not use drugs.   Family History:  The patient's family history includes Alcohol abuse in her father; Cancer in her father; Lung cancer in her father; Sudden death in an other family member.  ROS:  Please see the history of present illness. All other systems are reviewed and otherwise negative.   PHYSICAL EXAM:  VS:  BP (!) 140/80 (BP Location: Left Arm, Patient Position: Sitting, Cuff Size: Normal)   Pulse 79   Ht 5\' 5"  (1.651 m)   Wt 173 lb (78.5 kg)   SpO2 97%   BMI 28.79 kg/m  BMI: Body mass index is 28.79 kg/m.  GEN- The patient is well appearing, alert and oriented x 3 today.   Lungs- Clear to ausculation bilaterally, normal work of breathing.  Heart- Irregularly irregular rate and rhythm, no murmurs, rubs or gallops Extremities- Trace peripheral edema, warm, dry Skin-   device pocket well-healed, no tethering   Device interrogation done today and reviewed by myself:  Battery 8.1 years Lead thresholds, impedence, sensing stable  No episodes No changes made today  EKG is ordered. Personal review of EKG from today shows: EKG Interpretation  Date/Time:  Wednesday September 16 2022 10:09:11 EDT Ventricular Rate:  79 PR Interval:    QRS Duration: 178 QT Interval:  434 QTC Calculation: 497 R Axis:   -70 Text Interpretation: Ventricular-paced rhythm with occasional Premature ventricular complexes When compared with ECG of 02-Mar-2022 11:33, Premature ventricular complexes are now Present Vent. rate has increased BY   4 BPM Confirmed by Sherie Don [phone]) on 09/16/2022 12:12:04 PM    03/02/2022 - V-paced, rate 75bpm NO PVCs  Recent Labs: 10/08/2021: Magnesium 2.2 03/02/2022: B Natriuretic Peptide 197.7 07/20/2022: ALT 28; TSH  3.161 09/16/2022: BUN 28; Creatinine, Ser 0.82; Hemoglobin 13.4; Platelets 161; Potassium 4.0; Sodium 139  03/12/2022: Cholesterol 194; HDL 34.80; LDL Cholesterol 134; Total CHOL/HDL Ratio 6; Triglycerides 125.0; VLDL 25.0   Estimated Creatinine Clearance: 50.1 mL/min (by C-G formula based on SCr of 0.82 mg/dL).   Wt Readings from Last 3 Encounters:  09/16/22 173 lb (78.5 kg)  07/20/22 171 lb (77.6 kg)  07/16/22 174 lb 2 oz (79 kg)  Additional studies reviewed include: Previous EP, cardiology notes.   TTE, 08/18/2022  1. Left ventricular ejection fraction, by estimation, is 25 to 30%. The left ventricle has severely decreased function. The left ventricle demonstrates global hypokinesis. Left ventricular diastolic parameters are indeterminate.   2. Right ventricular systolic function is low normal. The right ventricular size is normal. There is moderately elevated pulmonary artery systolic pressure.   3. Left atrial size was severely dilated.   4. Right atrial size was moderately dilated.   5. The mitral valve has been repaired/replaced. Mild mitral valve regurgitation.   6. Tricuspid valve regurgitation is mild to moderate.   7. The aortic valve is tricuspid. Aortic valve regurgitation is not visualized. Aortic valve sclerosis/calcification is present, without any evidence of aortic stenosis.   8. The inferior vena cava is dilated in size with <50% respiratory variability, suggesting right atrial pressure of 15 mmHg.   Comparison(s): 06/10/19: 55-60%, MV annuloplasty ring (10/1998), RVSP 37, LAE, mild MS, m/m MR, mass on PMVL.   ASSESSMENT AND PLAN:  #) perm AFib #) PPM in situ #) s/p AVN ablation Pacemaker functioning well, see paceart for details Ventricular rates well-controlled, if not somewhat blunted - consider adjusting rate response at follow-up  #) Hypercoag d/t perm AFib CHA2DS2-VASc Score = 4 [CHF History: 1, HTN History: 0, Diabetes History: 0, Stroke History: 2,  Vascular Disease History: 0, Age Score: 2, Gender Score: 1].  Therefore, the patient's annual risk of stroke is 4.8 %. OAC - warfarin, mgmt at PCP office No bleeding concerns :1}    #) HFrEF #) fatigue Recent echo shows reduced EF compared to prior NYHA III symptoms Warm and dry on exam LHC to further evaluate - CBC, BMET, INR today  GDMT: patient intolerant of metop d/t hair loss. Will trial bisoprolol - uptitrate of GDMT at follow-up   Informed Consent   Shared Decision Making/Informed Consent The risks [stroke (1 in 1000), death (1 in 1000), kidney failure [usually temporary] (1 in 500), bleeding (1 in 200), allergic reaction [possibly serious] (1 in 200)], benefits (diagnostic support and management of coronary artery disease) and alternatives of a cardiac catheterization were discussed in detail with Ms. Lundy and she is willing to proceed.      Current medicines are reviewed at length with the patient today.   The patient has concerns regarding her medicines.  The following changes were made today:   STOP toprol START bisprolol 2.5mg  daily  Labs/ tests ordered today include:  Orders Placed This Encounter  Procedures   CBC   Basic Metabolic Panel (BMET)   Protime-INR   EKG 12-Lead     Disposition: Follow up with Dr. Graciela Husbands or EP APP in 6 months  Follow-up with gen cards APP 2-3 wks after [facility]  Signed, Sherie Don, NP  09/16/22  12:16 PM  Electrophysiology [facility]    ADDENDUM With patient's history of CVA, she will require bridging with lovenox prior to [facility].  Discussed with Pharmacy, who provided a lovenox bridging schedule - in separate telephone note.  I attempted to call patient to discuss, did not reach her.  Mychart msg sent, and will call patient again tomorrow.

## 2022-09-16 ENCOUNTER — Telehealth: Payer: Self-pay | Admitting: Pharmacist

## 2022-09-16 ENCOUNTER — Encounter: Payer: Self-pay | Admitting: Cardiology

## 2022-09-16 ENCOUNTER — Other Ambulatory Visit
Admission: RE | Admit: 2022-09-16 | Discharge: 2022-09-16 | Disposition: A | Discharge status: Home / Self Care | Payer: Medicare Other | Source: Ambulatory Visit | Attending: Cardiology | Admitting: Cardiology

## 2022-09-16 ENCOUNTER — Ambulatory Visit: Payer: Medicare Other | Attending: Cardiology | Admitting: Cardiology

## 2022-09-16 VITALS — BP 140/80 | HR 79 | Ht 65.0 in | Wt 173.0 lb

## 2022-09-16 DIAGNOSIS — Z7901 Long term (current) use of anticoagulants: Secondary | ICD-10-CM | POA: Insufficient documentation

## 2022-09-16 DIAGNOSIS — Z9889 Other specified postprocedural states: Secondary | ICD-10-CM | POA: Insufficient documentation

## 2022-09-16 DIAGNOSIS — I4821 Permanent atrial fibrillation: Secondary | ICD-10-CM | POA: Insufficient documentation

## 2022-09-16 DIAGNOSIS — I493 Ventricular premature depolarization: Secondary | ICD-10-CM

## 2022-09-16 DIAGNOSIS — I482 Chronic atrial fibrillation, unspecified: Secondary | ICD-10-CM

## 2022-09-16 DIAGNOSIS — I5020 Unspecified systolic (congestive) heart failure: Secondary | ICD-10-CM | POA: Insufficient documentation

## 2022-09-16 DIAGNOSIS — Z950 Presence of cardiac pacemaker: Secondary | ICD-10-CM | POA: Diagnosis not present

## 2022-09-16 LAB — CUP PACEART INCLINIC DEVICE CHECK
Date Time Interrogation Session: 20240626173602
Implantable Lead Connection Status: 753985
Implantable Lead Implant Date: 20210507
Implantable Lead Location: 753860
Implantable Lead Model: 5076
Implantable Pulse Generator Implant Date: 20210507

## 2022-09-16 LAB — CBC
HCT: 42.8 % (ref 36.0–46.0)
Hemoglobin: 13.4 g/dL (ref 12.0–15.0)
MCH: 28.1 pg (ref 26.0–34.0)
MCHC: 31.3 g/dL (ref 30.0–36.0)
MCV: 89.7 fL (ref 80.0–100.0)
Platelets: 161 10*3/uL (ref 150–400)
RBC: 4.77 MIL/uL (ref 3.87–5.11)
RDW: 14.7 % (ref 11.5–15.5)
WBC: 4.5 10*3/uL (ref 4.0–10.5)
nRBC: 0 % (ref 0.0–0.2)

## 2022-09-16 LAB — BASIC METABOLIC PANEL
Anion gap: 10 (ref 5–15)
BUN: 28 mg/dL — ABNORMAL HIGH (ref 8–23)
CO2: 27 mmol/L (ref 22–32)
Calcium: 9.4 mg/dL (ref 8.9–10.3)
Chloride: 102 mmol/L (ref 98–111)
Creatinine, Ser: 0.82 mg/dL (ref 0.44–1.00)
GFR, Estimated: >60 mL/min (ref >60)
Glucose, Bld: 100 mg/dL — ABNORMAL HIGH (ref 70–99)
Potassium: 4 mmol/L (ref 3.5–5.1)
Sodium: 139 mmol/L (ref 135–145)

## 2022-09-16 LAB — PROTIME-INR
INR: 2.3 — ABNORMAL HIGH (ref 0.8–1.2)
Prothrombin Time: 25.4 seconds — ABNORMAL HIGH (ref 11.4–15.2)

## 2022-09-16 MED ORDER — BISOPROLOL FUMARATE 5 MG PO TABS: 2.5000 mg | ORAL_TABLET | Freq: Every day | ORAL | 1 refills | Status: DC

## 2022-09-16 MED ORDER — ENOXAPARIN SODIUM 80 MG/0.8ML IJ SOSY
80.0000 mg | PREFILLED_SYRINGE | Freq: Two times a day (BID) | INTRAMUSCULAR | 0 refills | Status: DC
Start: 2022-09-16 — End: 2022-10-15

## 2022-09-16 NOTE — Telephone Encounter (Signed)
Patient with diagnosis of A Fib on warfrain for anticoagulation.    Procedure: LHC Date of procedure: 09/21/22   CHA2DS2-VASc Score = 6  This indicates a 9.7% annual risk of stroke. The patient's score is based upon: CHF History: 1 HTN History: 0 Diabetes History: 0 Stroke History: 2 Vascular Disease History: 0 Age Score: 2 Gender Score: 1    CrCl 60 mL/min Platelet count 161K  Per office protocol, patient can hold warfarin for 5 days prior to procedure.    Patient WILL need bridging with Lovenox (enoxaparin) around procedure.  6/26: No warfarin or enoxaparin (Lovenox).  6/27: Inject enoxaparin 80mg  in the fatty abdominal tissue at least 2 inches from the belly button twice a day about 12 hours apart, 8am and 8pm rotate sites. No warfarin.  6/28: Inject enoxaparin in the fatty tissue every 12 hours, 8am and 8pm. No warfarin.  6/29: Inject enoxaparin in the fatty tissue every 12 hours, 8am and 8pm. No warfarin.  6/30: Inject enoxaparin in the fatty tissue in the morning at 8 am (No PM dose). No warfarin.  7/1: Procedure Day - No enoxaparin - Resume warfarin in the evening or as directed by doctor (take an extra half tablet with usual dose for 2 days then resume normal dose).  7/2: Resume enoxaparin inject in the fatty tissue every 12 hours and take warfarin  7/3: Inject enoxaparin in the fatty tissue every 12 hours and take warfarin  7/4: Inject enoxaparin in the fatty tissue every 12 hours and take warfarin  7/5: Inject enoxaparin in the fatty tissue every 12 hours and take warfarin  7/5: Inject enoxaparin in the fatty tissue every 12 hours and take warfarin  7/6: warfarin appt to check INR.

## 2022-09-16 NOTE — Telephone Encounter (Signed)
I attempted to call patient to discuss need for Lovenox bridge after our appointment earlier today. No answer on home number or cell, no VM set up. Will send mychart message with information

## 2022-09-16 NOTE — Patient Instructions (Addendum)
Medication Instructions:  START bisoprolol 5 mg-take 1/2 tablet (2.5 mg) by mouth daily *If you need a refill on your cardiac medications before your next appointment, please call your pharmacy*   Lab Work: PT INR, BMP, and CBC today - Please go to the [facility]. You will check in at the front desk to the right as you walk into the atrium. Valet Parking is offered if needed. - No appointment needed. You may go any day between 7 am and 6 pm.  If you have labs (blood work) drawn today and your tests are completely normal, you will receive your results only by: MyChart Message (if you have MyChart) OR A paper copy in the mail If you have any lab test that is abnormal or we need to change your treatment, we will call you to review the results.   Testing/Procedures:  [place] [facility] [place] [address] [place] [address] Dept: [phone] Loc: [phone]  [name]  09/16/2022  You are scheduled for a Cardiac Catheterization on Monday, July 1 with Dr. Lorine Bears.  1. Please arrive at the Heart & Vascular Center Entrance of [facility], [address], [place] [address] at 9:30 AM (This is 1 hour(s) prior to your procedure time).  Proceed to the Check-In Desk directly inside the entrance.  Procedure Parking: Use the entrance off of the [address] side of the hospital. Turn right upon entering and follow the driveway to parking that is directly in front of the Heart & Vascular Center. There is no valet parking available at this entrance, however there is an awning directly in front of the Heart & Vascular Center for drop off/ pick up for patients.  Special note: Every effort is made to have your procedure done on time. Please understand that emergencies sometimes delay scheduled procedures.  2. Diet: Do not eat solid foods after midnight.  The patient may have clear  liquids until 5am upon the day of the procedure.  3. Labs: You will need to have blood drawn today as listed above. You do not need to be fasting.  4. Medication instructions in preparation for your procedure: Do not take furosemide until after your procedure.   Contrast Allergy: No  STOP taking warfarin today, September 16, 2022. You will be given further instruction on when to restart warfarin after your procedure.  On the morning of your procedure, take Aspirin 81 mg and any morning medicines NOT listed above.  You may use sips of water.  5. Plan to go home the same day, you will only stay overnight if medically necessary. 6. Bring a current list of your medications and current insurance cards. 7. You MUST have a responsible person to drive you home. 8. Someone MUST be with you the first 24 hours after you arrive home or your discharge will be delayed. 9. Please wear clothes that are easy to get on and off and wear slip-on shoes.  Thank you for allowing [name] to care for you!   -- [facility]   Follow-Up: At [facility], you and your health needs are our priority.  As part of our continuing mission to provide you with exceptional heart care, we have created designated Provider Care Teams.  These Care Teams include your primary Cardiologist (physician) and Advanced Practice Providers (APPs -  Physician Assistants and Nurse Practitioners) who all  work together to provide you with the care you need, when you need it.  We recommend signing up for the patient portal called "MyChart".  Sign up information is provided on this After Visit Summary.  MyChart is used to connect with patients for Virtual Visits (Telemedicine).  Patients are able to view lab/test results, encounter notes, upcoming appointments, etc.  Non-urgent messages can be sent to your provider as well.   To learn more about what you can do with MyChart, go to [email or web address].    Your  next appointment:   6 month(s)  Provider:   Sherryl Manges, MD or Sherie Don, NP   Other Instructions Schedule 3 week F/U with general cardiology.

## 2022-09-17 ENCOUNTER — Ambulatory Visit: Payer: Medicare Other

## 2022-09-17 NOTE — Telephone Encounter (Signed)
Contacted pt and scheduled her for 7/8 for lab apt with [facility] lab. Result will be sent to [facility] coumadin clinic nurse. Coumadin clinic nurse will contact pt with any dosing instructions.

## 2022-09-17 NOTE — Telephone Encounter (Signed)
I called patient to confirm lovenox administration instructions. She has used before with prior procedures and feels comfortable administering medication.   No further questions/concerns at this time.

## 2022-09-17 NOTE — Telephone Encounter (Signed)
Pt was scheduled for coumadin clinic today for INR check. Noticed in chart pt will have R/L heart cath and coronary angiogram on 7/1 and has been placed on a lovenox bridge by cardiology. Pt will not need INR check today but per cardiology pharmacist, pt will need INR check on 7/6. This date is on a Saturday and [facility] coumadin clinic will not be able to accommodate this date. [facility] coumadin clinic is held on Thursdays, which would be 7/11. Will send msg to pharmacist to inquire if this date is ok. If sooner INR is needed she could go to the lab at [facility] on 7/8, 7/9, or 7/10, and this nurse could f/u with pt for dosing instructions once result is received.  Also forwarding msg to PCP as FYI.

## 2022-09-18 ENCOUNTER — Telehealth: Payer: Self-pay | Admitting: Internal Medicine

## 2022-09-18 NOTE — Telephone Encounter (Signed)
Called patient. Injected Lovenox last night at 8pm. Took shower this morning and noticed spot where she injected last night had started bleeding. Completed showering and put bandage on. Gave morning Lovenox injection today without incident. Patient reports her skin is very fragile. Advised to try to avoid area where she injected last night and to watch for signs/symptoms of bleeding and to call back if it occurs again.

## 2022-09-18 NOTE — Telephone Encounter (Signed)
Patient states that the shots that she gives herself, she stated bleeding but she shouldn't. Wanted to make the dr aware, and to see what she should do. Please advise

## 2022-09-21 ENCOUNTER — Other Ambulatory Visit: Payer: Self-pay

## 2022-09-21 ENCOUNTER — Encounter
Admission: RE | Disposition: A | Discharge status: Home / Self Care | Payer: Self-pay | Source: Ambulatory Visit | Attending: Cardiovascular Disease

## 2022-09-21 ENCOUNTER — Ambulatory Visit
Admission: RE | Admit: 2022-09-21 | Discharge: 2022-09-21 | Disposition: A | Discharge status: Home / Self Care | Payer: Medicare Other | Source: Ambulatory Visit | Attending: Cardiovascular Disease | Admitting: Cardiovascular Disease

## 2022-09-21 ENCOUNTER — Encounter: Payer: Self-pay | Admitting: Cardiovascular Disease

## 2022-09-21 ENCOUNTER — Telehealth: Payer: Self-pay

## 2022-09-21 DIAGNOSIS — I5020 Unspecified systolic (congestive) heart failure: Secondary | ICD-10-CM

## 2022-09-21 DIAGNOSIS — I340 Nonrheumatic mitral (valve) insufficiency: Secondary | ICD-10-CM | POA: Diagnosis not present

## 2022-09-21 DIAGNOSIS — I110 Hypertensive heart disease with heart failure: Secondary | ICD-10-CM | POA: Insufficient documentation

## 2022-09-21 DIAGNOSIS — I2582 Chronic total occlusion of coronary artery: Secondary | ICD-10-CM | POA: Insufficient documentation

## 2022-09-21 DIAGNOSIS — Z79899 Other long term (current) drug therapy: Secondary | ICD-10-CM

## 2022-09-21 DIAGNOSIS — I4821 Permanent atrial fibrillation: Secondary | ICD-10-CM | POA: Insufficient documentation

## 2022-09-21 DIAGNOSIS — Z7901 Long term (current) use of anticoagulants: Secondary | ICD-10-CM | POA: Insufficient documentation

## 2022-09-21 DIAGNOSIS — Z7982 Long term (current) use of aspirin: Secondary | ICD-10-CM | POA: Diagnosis not present

## 2022-09-21 DIAGNOSIS — I428 Other cardiomyopathies: Secondary | ICD-10-CM

## 2022-09-21 DIAGNOSIS — I5021 Acute systolic (congestive) heart failure: Secondary | ICD-10-CM

## 2022-09-21 DIAGNOSIS — I2510 Atherosclerotic heart disease of native coronary artery without angina pectoris: Secondary | ICD-10-CM | POA: Insufficient documentation

## 2022-09-21 DIAGNOSIS — Z8673 Personal history of transient ischemic attack (TIA), and cerebral infarction without residual deficits: Secondary | ICD-10-CM | POA: Insufficient documentation

## 2022-09-21 DIAGNOSIS — I4891 Unspecified atrial fibrillation: Secondary | ICD-10-CM

## 2022-09-21 DIAGNOSIS — Z950 Presence of cardiac pacemaker: Secondary | ICD-10-CM | POA: Diagnosis not present

## 2022-09-21 DIAGNOSIS — R5383 Other fatigue: Secondary | ICD-10-CM | POA: Diagnosis not present

## 2022-09-21 HISTORY — PX: RIGHT/LEFT HEART CATH AND CORONARY ANGIOGRAPHY: CATH118266

## 2022-09-21 LAB — POCT I-STAT 7, (LYTES, BLD GAS, ICA,H+H)
Acid-Base Excess: 2 mmol/L (ref 0.0–2.0)
Bicarbonate: 27.9 mmol/L (ref 20.0–28.0)
Calcium, Ion: 1.21 mmol/L (ref 1.15–1.40)
HCT: 34 % — ABNORMAL LOW (ref 36.0–46.0)
Hemoglobin: 11.6 g/dL — ABNORMAL LOW (ref 12.0–15.0)
O2 Saturation: 92 %
Potassium: 3.6 mmol/L (ref 3.5–5.1)
Sodium: 144 mmol/L (ref 135–145)
TCO2: 29 mmol/L (ref 22–32)
pCO2 arterial: 49.7 mmHg — ABNORMAL HIGH (ref 32–48)
pH, Arterial: 7.356 (ref 7.35–7.45)
pO2, Arterial: 66 mmHg — ABNORMAL LOW (ref 83–108)

## 2022-09-21 LAB — POCT I-STAT EG7
Acid-Base Excess: 3 mmol/L — ABNORMAL HIGH (ref 0.0–2.0)
Bicarbonate: 29.5 mmol/L — ABNORMAL HIGH (ref 20.0–28.0)
Calcium, Ion: 1.21 mmol/L (ref 1.15–1.40)
HCT: 34 % — ABNORMAL LOW (ref 36.0–46.0)
Hemoglobin: 11.6 g/dL — ABNORMAL LOW (ref 12.0–15.0)
O2 Saturation: 59 %
Potassium: 3.7 mmol/L (ref 3.5–5.1)
Sodium: 144 mmol/L (ref 135–145)
TCO2: 31 mmol/L (ref 22–32)
pCO2, Ven: 53.9 mmHg (ref 44–60)
pH, Ven: 7.346 (ref 7.25–7.43)
pO2, Ven: 33 mmHg (ref 32–45)

## 2022-09-21 LAB — PROTIME-INR
INR: 1.2 (ref 0.8–1.2)
Prothrombin Time: 15.6 seconds — ABNORMAL HIGH (ref 11.4–15.2)

## 2022-09-21 SURGERY — RIGHT/LEFT HEART CATH AND CORONARY ANGIOGRAPHY
Anesthesia: Moderate Sedation | Laterality: Bilateral

## 2022-09-21 MED ORDER — HEPARIN (PORCINE) IN NACL 2000-0.9 UNIT/L-% IV SOLN
INTRAVENOUS | Status: DC | PRN
  Administered 2022-09-21: 1000 mL

## 2022-09-21 MED ORDER — SODIUM CHLORIDE 0.9 % IV SOLN: INTRAVENOUS | Status: DC

## 2022-09-21 MED ORDER — VERAPAMIL HCL 2.5 MG/ML IV SOLN
INTRAVENOUS | Status: AC
  Filled 2022-09-21: qty 2

## 2022-09-21 MED ORDER — MIDAZOLAM HCL 2 MG/2ML IJ SOLN
INTRAMUSCULAR | Status: AC
  Filled 2022-09-21: qty 2

## 2022-09-21 MED ORDER — ONDANSETRON HCL 4 MG/2ML IJ SOLN: 4.0000 mg | Freq: Four times a day (QID) | INTRAMUSCULAR | Status: DC | PRN

## 2022-09-21 MED ORDER — SODIUM CHLORIDE 0.9 % IV SOLN: 250.0000 mL | INTRAVENOUS | Status: DC | PRN

## 2022-09-21 MED ORDER — SODIUM CHLORIDE 0.9% FLUSH
3.0000 mL | Freq: Two times a day (BID) | INTRAVENOUS | Status: DC
  Administered 2022-09-21: 3 mL via INTRAVENOUS

## 2022-09-21 MED ORDER — HEPARIN SODIUM (PORCINE) 1000 UNIT/ML IJ SOLN
INTRAMUSCULAR | Status: DC | PRN
  Administered 2022-09-21: 3000 [IU] via INTRAVENOUS

## 2022-09-21 MED ORDER — LIDOCAINE HCL 1 % IJ SOLN
INTRAMUSCULAR | Status: AC
  Filled 2022-09-21: qty 20

## 2022-09-21 MED ORDER — HEPARIN SODIUM (PORCINE) 1000 UNIT/ML IJ SOLN
INTRAMUSCULAR | Status: AC
  Filled 2022-09-21: qty 10

## 2022-09-21 MED ORDER — SODIUM CHLORIDE 0.9% FLUSH: 3.0000 mL | INTRAVENOUS | Status: DC | PRN

## 2022-09-21 MED ORDER — LIDOCAINE HCL (PF) 1 % IJ SOLN
INTRAMUSCULAR | Status: DC | PRN
  Administered 2022-09-21: 5 mL

## 2022-09-21 MED ORDER — SODIUM CHLORIDE 0.9% FLUSH: 3.0000 mL | Freq: Two times a day (BID) | INTRAVENOUS | Status: DC

## 2022-09-21 MED ORDER — VERAPAMIL HCL 2.5 MG/ML IV SOLN
INTRAVENOUS | Status: DC | PRN
  Administered 2022-09-21: 2.5 mg via INTRAVENOUS

## 2022-09-21 MED ORDER — ACETAMINOPHEN 325 MG PO TABS: 650.0000 mg | ORAL_TABLET | ORAL | Status: DC | PRN

## 2022-09-21 MED ORDER — IOHEXOL 350 MG/ML SOLN
INTRAVENOUS | Status: DC | PRN
  Administered 2022-09-21: 45 mL

## 2022-09-21 MED ORDER — HEPARIN (PORCINE) IN NACL 1000-0.9 UT/500ML-% IV SOLN
INTRAVENOUS | Status: AC
  Filled 2022-09-21: qty 1000

## 2022-09-21 MED ORDER — FENTANYL CITRATE (PF) 100 MCG/2ML IJ SOLN
INTRAMUSCULAR | Status: AC
  Filled 2022-09-21: qty 2

## 2022-09-21 MED ORDER — MIDAZOLAM HCL 2 MG/2ML IJ SOLN
INTRAMUSCULAR | Status: DC | PRN
  Administered 2022-09-21: 1 mg via INTRAVENOUS

## 2022-09-21 MED ORDER — ASPIRIN 81 MG PO CHEW
81.0000 mg | CHEWABLE_TABLET | ORAL | Status: AC
  Administered 2022-09-21: 81 mg via ORAL

## 2022-09-21 SURGICAL SUPPLY — 14 items
CATH BALLN WEDGE 5F 110CM (CATHETERS) IMPLANT
CATH INFINITI 5FR JK (CATHETERS) IMPLANT
DEVICE RAD TR BAND REGULAR (VASCULAR PRODUCTS) IMPLANT
DRAPE BRACHIAL (DRAPES) IMPLANT
DRAPE FEMORAL ANGIO W/ POUCH (DRAPES) IMPLANT
GLIDESHEATH SLEND SS 6F .021 (SHEATH) IMPLANT
GUIDEWIRE INQWIRE 1.5J.035X260 (WIRE) IMPLANT
INQWIRE 1.5J .035X260CM (WIRE) ×1
PACK CARDIAC CATH (CUSTOM PROCEDURE TRAY) ×1 IMPLANT
PROTECTION STATION PRESSURIZED (MISCELLANEOUS) ×1
SET ATX-X65L (MISCELLANEOUS) IMPLANT
SHEATH GLIDE SLENDER 4/5FR (SHEATH) IMPLANT
STATION PROTECTION PRESSURIZED (MISCELLANEOUS) IMPLANT
WIRE HITORQ VERSACORE ST 145CM (WIRE) IMPLANT

## 2022-09-21 NOTE — Interval H&P Note (Signed)
History and Physical Interval Note:  09/21/2022 11:00 AM  [name]  has presented today for surgery, with the diagnosis of L and R Cath   Afib   Newly reduced EF.  The various methods of treatment have been discussed with the patient and family. After consideration of risks, benefits and other options for treatment, the patient has consented to  Procedure(s): RIGHT/LEFT HEART CATH AND CORONARY ANGIOGRAPHY (Bilateral) as a surgical intervention.  The patient's history has been reviewed, patient examined, no change in status, stable for surgery.  I have reviewed the patient's chart and labs.  Questions were answered to the patient's satisfaction.     Lorine Bears

## 2022-09-21 NOTE — Telephone Encounter (Signed)
Referral placed to Heart Failure Clinic per recommendation in cardiac cath report/Suzann Riddle, NP.

## 2022-09-21 NOTE — Telephone Encounter (Signed)
-----   Message from Sherie Don, NP sent at 09/21/2022  2:42 PM EDT ----- Could you help get this patient an appt with HF MD (Sabharwal, McLean, or Bensimhon) as recommended after patient's cath today.   Thank you! suzann  ----- Message ----- From: [place] Ouch, MD Sent: 09/21/2022  11:52 AM EDT To: [name] Salvia, MD; Sherie Don, NP

## 2022-09-22 ENCOUNTER — Encounter: Payer: Self-pay | Admitting: Cardiovascular Disease

## 2022-09-28 ENCOUNTER — Other Ambulatory Visit: Payer: Self-pay | Admitting: *Deleted

## 2022-09-28 ENCOUNTER — Ambulatory Visit (INDEPENDENT_AMBULATORY_CARE_PROVIDER_SITE_OTHER): Payer: Medicare Other

## 2022-09-28 ENCOUNTER — Other Ambulatory Visit: Payer: Medicare Other

## 2022-09-28 DIAGNOSIS — Z7901 Long term (current) use of anticoagulants: Secondary | ICD-10-CM | POA: Diagnosis not present

## 2022-09-28 LAB — POCT INR: INR: 1.4 — AB (ref 2.0–3.0)

## 2022-09-28 MED ORDER — ALBUTEROL SULFATE HFA 108 (90 BASE) MCG/ACT IN AERS: 1.0000 | INHALATION_SPRAY | Freq: Four times a day (QID) | RESPIRATORY_TRACT | 2 refills | Status: AC | PRN

## 2022-09-28 NOTE — Telephone Encounter (Signed)
Pt came into the lab and asked to refill inhaler. Doesn't look like PCP filled med before so refill request sent to the PCP. Last OV with PCP is 07/03/22  [facility]

## 2022-09-28 NOTE — Progress Notes (Signed)
Pt had heart cath on 7/1 and warfarin was managed by cardiology. Pt held warfarin for 5 days and was placed on a lovenox bridge. No booster doses of warfarin were prescribed after surgery. Pt went to [facility] lab today for INR check. POCT INR today is 1.4. Contacted pt by phone. Advised she should continue lovenox injections and warfarin and recheck on 7/11. Pt refuses any further lovenox injections. Advised of dosing below and we will still recheck INR on 7/11 due to labile INR results at last 2 apts. Advised pt to watch for s/s of blood clots and if any s/s to call 911. Pt verbalized understanding. Scheduled coumadin clinic apt for 7/11 at [facility] clinic.  Increase dose today to take 1 1/2 tablets and increase dose tomorrow to take 1 1/2 tablets and the continue 1 tablet daily except take 1/2 tablet on Sundays and Wednesdays . Recheck on 7/11.

## 2022-09-28 NOTE — Patient Instructions (Addendum)
Pre visit review using our clinic review tool, if applicable. No additional management support is needed unless otherwise documented below in the visit note.  Increase dose today to take 1 1/2 tablets and increase dose tomorrow to take 1 1/2 tablets and the continue 1 tablet daily except take 1/2 tablet on Sundays and Wednesdays . Recheck on 7/11.

## 2022-10-01 ENCOUNTER — Ambulatory Visit (INDEPENDENT_AMBULATORY_CARE_PROVIDER_SITE_OTHER): Payer: Medicare Other

## 2022-10-01 DIAGNOSIS — Z7901 Long term (current) use of anticoagulants: Secondary | ICD-10-CM

## 2022-10-01 LAB — POCT INR: INR: 2 (ref 2.0–3.0)

## 2022-10-01 NOTE — Progress Notes (Addendum)
Continue 1 tablet daily except take 1/2 tablet on Sundays and Wednesdays . Recheck in 4 weeks.

## 2022-10-01 NOTE — Patient Instructions (Addendum)
Pre visit review using our clinic review tool, if applicable. No additional management support is needed unless otherwise documented below in the visit note.  Continue 1 tablet daily except take 1/2 tablet on Sundays and Wednesdays . Recheck in 4 weeks.

## 2022-10-08 ENCOUNTER — Ambulatory Visit: Payer: Medicare Other | Attending: Cardiology | Admitting: Cardiology

## 2022-10-08 ENCOUNTER — Encounter: Payer: Self-pay | Admitting: Cardiology

## 2022-10-08 VITALS — BP 136/70 | HR 73 | Ht 65.0 in | Wt 170.6 lb

## 2022-10-08 DIAGNOSIS — E039 Hypothyroidism, unspecified: Secondary | ICD-10-CM | POA: Diagnosis not present

## 2022-10-08 DIAGNOSIS — Z9889 Other specified postprocedural states: Secondary | ICD-10-CM | POA: Diagnosis not present

## 2022-10-08 DIAGNOSIS — I482 Chronic atrial fibrillation, unspecified: Secondary | ICD-10-CM

## 2022-10-08 DIAGNOSIS — Z950 Presence of cardiac pacemaker: Secondary | ICD-10-CM

## 2022-10-08 DIAGNOSIS — I5020 Unspecified systolic (congestive) heart failure: Secondary | ICD-10-CM

## 2022-10-08 DIAGNOSIS — I2510 Atherosclerotic heart disease of native coronary artery without angina pectoris: Secondary | ICD-10-CM

## 2022-10-08 MED ORDER — BISOPROLOL FUMARATE 5 MG PO TABS: 2.5000 mg | ORAL_TABLET | Freq: Every day | ORAL | 2 refills | Status: DC

## 2022-10-08 NOTE — Progress Notes (Signed)
Cardiology Office Note:  .   Date:  10/08/2022  ID:  [name], DOB 1932-12-10, MRN [id number] PCP: [name], MD  [facility] Providers Cardiologist:  None Electrophysiologist:  Sherryl Manges, MD    History of Present Illness: [name]   [name] is a 89 y.o. 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  Extensive A-fib history where she had AAD history Tikosyn was discontinued with progression of permanent A-fib.  Had 07/28/2019 she had AV nodal ablation.  Afterward she had a CVA secondary to subtherapeutic INR, continued on aspirin and apixaban, Eliquis alone, and then she was back on warfarin therapy.  In 07/28/2019 she had MDT single-chamber PPM implant and status post her AV node ablation.  She was previously evaluated by EP for an acute visit due to chest twinges going across her left chest with increasing fatigue.  She was found on her device interrogation to have bigeminal PVCs in clinic.  Metoprolol was started at that visit and she was scheduled for an updated echocardiogram to further evaluate which revealed newly reduced LVEF.  She had planned to follow-up with general cardiology Dr.Arida but was unable to keep that appointment due to car trouble.  On her visit 09/16/2022 she was feeling fatigued.  Feels as though she has a scale of her former self.  States that she has these electrical twinges that continued throughout her body.  She denies any overt chest pain or pressure but is noted these chest twinges that she calls them and some associated shortness of breath and fatigue.  Echocardiogram revealed an LVEF of 25-30%, mild mitral valve regurgitation, moderately elevated pulmonary artery systolic  pressures, mild to moderate MR. Left heart catheterization revealed a third RPL lesion 100% stenosis, moderate to severe left ventricular systolic dysfunction, LV end-systolic pressure is normal, LVEF 25 to 35% by visual estimate.  No significant coronary artery disease the exception of the occluded small RPL branch with collaterals from the left circumflex.  Moderately to severely reduced LV systolic function right heart catheterization showed high normal filling pressures, normal pulmonary pressure and normal cardiac output.  Recommendation was medical therapy and referral to advanced heart failure to optimize her medications.  She returns to clinic today stating overall she has been doing okay.  She continues to have episodes of fatigue, post nasal drip that is chronic, peripheral edema, and occasional shortness of breath.  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  She states that she had been previously shocked by a range that she had in her home that had to be removed but she has not had any issues or concerns with her pacemaker.  She continues to be on warfarin that is managed by [facility] clinic. Has done well since she had right and left heart catheterization completed.  There was some confusion on her medications.   ROS: 10 point review of systems has been completed and considered negative with exception of what is been listed in the HPI.  Studies Reviewed: [name]   EKG Interpretation Date/Time:  Thursday October 08 2022 09:50:19 EDT Ventricular Rate:  73 PR Interval:    QRS Duration:  178  QT Interval:  462 QTC Calculation: 508 R Axis:   -70  Text Interpretation: Ventricular-paced rhythm When compared with ECG of 16-Sep-2022 10:09, Premature ventricular complexes are no longer Present Vent. rate has decreased BY   6 BPM Confirmed by Charlsie Quest ([id number]) on 10/08/2022 9:52:37 AM  R/LHC 09/21/2022    3rd RPL lesion  is 100% stenosed.   There is moderate to severe left ventricular systolic dysfunction.   LV end diastolic pressure is normal.   The left ventricular ejection fraction is 25-35% by visual estimate.   1.  No significant coronary artery disease with the exception of an occluded small RPL branch with collaterals from the left circumflex. 2.  Moderately to severely reduced LV systolic function. 3.  Right heart catheterization showed high normal filling pressures, normal pulmonary pressure and normal cardiac output.   RA: 4 mmHg PW: 11/22 with a mean of 13 mmHg.  Prominent V wave suggestive of at least moderate mitral regurgitation. PA: 31/13 with a mean of 20 mmHg Cardiac output: 4.72 with an index of 2.55.   Recommendations: The patient has nonischemic cardiomyopathy for which I recommend medical therapy.  Recommend referral to advanced heart failure to optimize her medications.  08/18/22: TTE 1. Left ventricular ejection fraction, by estimation, is 25 to 30%. The  left ventricle has severely decreased function. The left ventricle  demonstrates global hypokinesis. Left ventricular diastolic parameters are  indeterminate.   2. Right ventricular systolic function is low normal. The right  ventricular size is normal. There is moderately elevated pulmonary artery  systolic pressure.   3. Left atrial size was severely dilated.   4. Right atrial size was moderately dilated.   5. The mitral valve has been repaired/replaced. Mild mitral valve  regurgitation.   6. Tricuspid valve regurgitation is mild to moderate.   7. The aortic valve is tricuspid. Aortic valve regurgitation is not  visualized. Aortic valve sclerosis/calcification is present, without any  evidence of aortic stenosis.   8. The inferior vena cava is dilated in size with <50% respiratory  variability, suggesting right atrial pressure of 15 mmHg.   Comparison(s): 06/10/19: 55-60%, MV annuloplasty ring (10/1998), RVSP 37,  LAE, mild  MS, m/m MR, mass on PMVL.   05/01/15: TTE Study Conclusions - Left ventricle: Septal and apical hypokinesis The cavity size was   normal. Wall thickness was normal. Systolic function was normal.   The estimated ejection fraction was in the range of 50% to 55%. - Mitral valve: Post mitral valve repair with mild central MR. - Left atrium: The atrium was severely dilated. - Right atrium: The atrium was mildly dilated. - Atrial septum: No defect or patent foramen ovale was identified.   06/19/2019: TEE IMPRESSIONS   1. Left ventricular ejection fraction, by estimation, is 55 to 60%. The  left ventricle has normal function. The left ventricle has no regional  wall motion abnormalities. There is mild left ventricular hypertrophy.   2. Right ventricular systolic function is normal. The right ventricular  size is normal.   3. Left atrial size was severely dilated. No left atrial/left atrial  appendage thrombus was detected.   4. The mitral valve has been repaired/replaced. Mild to moderate mitral  valve regurgitation. Mild mitral stenosis. There is a prosthetic  annuloplasty ring present in the mitral position. Procedure Date:  10/23/1998.   5. The tricuspid valve is abnormal. Tricuspid valve regurgitation is  moderate.   6. The aortic valve is tricuspid. Aortic valve regurgitation is not  visualized.   7. Late microbubble contrast suggestive of intrahepatic or intrapulmonary  shunt.     06/16/2019: TTE IMPRESSIONS  1. Left ventricular ejection fraction, by estimation, is 55 to 60%. The  left ventricle has normal function. The left ventricle has no regional  wall motion abnormalities. Left ventricular diastolic function could not  be evaluated.   2. Right ventricular systolic function is normal. The right ventricular  size is normal. There is mildly elevated pulmonary artery systolic  pressure. The estimated right ventricular systolic pressure is 37.1 mmHg.   3. Left atrial size was  severely dilated.   4. A mitral valve annuloplasty ring is present. There is a heavily  calcified focal mass-like structure present on the PMVL. This is likely  deterioration of the PMVL with mitral annular calcification. There are no  mobile components to it. There is no  apparent destruction of the valve. There is mild to moderate MR. There is  mild mitral stenosis. This is best viewed in the PLAX. When compared with  the prior echo this was present but not specifically commented on. The  mitral valve has been  repaired/replaced. Mild to moderate mitral valve regurgitation. The mean  mitral valve gradient is 6.3 mmHg with average heart rate of 80 bpm. There  is a prosthetic annuloplasty ring present in the mitral position.  Procedure Date: 10/23/1998.   5. The aortic valve is tricuspid. Aortic valve regurgitation is not  visualized. Mild aortic valve sclerosis is present, with no evidence of  aortic valve stenosis.   6. The inferior vena cava is dilated in size with <50% respiratory  variability, suggesting right atrial pressure of 15 mmHg.   Comparison(s): A prior study was performed on 12/01/2016. No significant  change from prior study.  Risk Assessment/Calculations:    CHA2DS2-VASc Score = 6   This indicates a 9.7% annual risk of stroke. The patient's score is based upon: CHF History: 1 HTN History: 0 Diabetes History: 0 Stroke History: 2 Vascular Disease History: 0 Age Score: 2 Gender Score: 1            Physical Exam:   VS:  BP 136/70 (BP Location: Left Arm, Patient Position: Sitting, Cuff Size: Normal)   Pulse 73   Ht 5\' 5"  (1.651 m)   Wt 170 lb 9.6 oz (77.4 kg)   SpO2 97%   BMI 28.39 kg/m    Wt Readings from Last 3 Encounters:  10/08/22 170 lb 9.6 oz (77.4 kg)  09/21/22 170 lb (77.1 kg)  09/16/22 173 lb (78.5 kg)    GEN: Well nourished, well developed in no acute distress NECK: No JVD; No carotid bruits CARDIAC: IR IR, no murmurs, rubs,  gallops RESPIRATORY:  Clear to auscultation without rales, wheezing or rhonchi  ABDOMEN: Soft, non-tender, non-distended EXTREMITIES: Trace pretibial edema to BLE; No deformity.  Right radial cath site without any pain, bruising, bleeding, or hematoma noted 2+ radial pulse on exam.  ASSESSMENT AND PLAN: .   Coronary artery disease with third RPL 100% stenosis with collaterals from the left circumflex noted on recent left heart catheterization.  No other significant coronary artery disease noted.  EKG today reveals she is ventricularly paced with no ischemic changes noted from prior study.  She is continued on warfarin and lieu of aspirin.  Is not currently on statin therapy as she states that she has an allergy to statins with last LDL 134.  Restarted on her bisoprolol 2.5 mg daily today.  Previously had  been on metoprolol which had to be discontinued due to interaction with Synthroid that was causing hair loss.  She is also being sent for follow-up CBC and BMP post catheterization.  HFrEF with an LVEF of 25-30% on echocardiogram 25-35% by visual assessment on heart catheterization.  She has been restarted on bisoprolol 2.5 mg daily.  NYHA II-III symptoms. she also continues on furosemide 40 mg as needed for peripheral edema.  Upcoming appointment has been scheduled with advanced heart failure to help with optimization of medications and escalation of GDMT as tolerated. (Patient is on atorvastatin started a lot of new medications) goal-directed medical therapy was discussed.  Weight is down 3 pounds from previous visit.  Continue with heart failure education.  Permanent atrial fibrillation status post AVN ablation with PPM in situ.  Ventricular rates are well-controlled and she is V pacing on EKG today.  Plan pacer is functioning well.  She continues to be followed by EP.  She is continued on warfarin for CHA2DS2-VASc score of at least 6 or greater for stroke prophylaxis.  INRs are managed by [facility].  Hypothyroidism where she is continued on Synthroid.  This continues to be managed by her PCP.       Dispo: Patient to keep appointment that is upcoming with advanced heart failure and primary cardiologist as previously scheduled.  She has been encouraged to notify the office to return or sooner if needed.  Signed, Rishikesh Khachatryan, NP

## 2022-10-08 NOTE — Patient Instructions (Addendum)
Medication Instructions:  Take Bisoprolol 2.5 mg daily   *If you need a refill on your cardiac medications before your next appointment, please call your pharmacy*   Lab Work: Your provider would like for you to have following labs drawn: CBC, BMET.   Please go to the [facility] entrance and check in at the front desk.  You do not need an appointment.  They are open from 7am-6 pm.   If you have labs (blood work) drawn today and your tests are completely normal, you will receive your results only by: MyChart Message (if you have MyChart) OR A paper copy in the mail If you have any lab test that is abnormal or we need to change your treatment, we will call you to review the results.   Follow-Up: At [facility], you and your health needs are our priority.  As part of our continuing mission to provide you with exceptional heart care, we have created designated Provider Care Teams.  These Care Teams include your primary Cardiologist (physician) and Advanced Practice Providers (APPs -  Physician Assistants and Nurse Practitioners) who all work together to provide you with the care you need, when you need it.  We recommend signing up for the patient portal called "MyChart".  Sign up information is provided on this After Visit Summary.  MyChart is used to connect with patients for Virtual Visits (Telemedicine).  Patients are able to view lab/test results, encounter notes, upcoming appointments, etc.  Non-urgent messages can be sent to your provider as well.   To learn more about what you can do with MyChart, go to [email or web address].    Your next appointment:   Keep follow up as scheduled.

## 2022-10-12 ENCOUNTER — Telehealth: Payer: Self-pay | Admitting: Internal Medicine

## 2022-10-12 NOTE — Telephone Encounter (Signed)
Patient says that its important for Dr. Graciela Husbands or nurse to call her. She said that she is going to have surgery tomorrow and really would like for Dr. Graciela Husbands to look over her medical records because he is the only one she trust.

## 2022-10-12 NOTE — Telephone Encounter (Signed)
Called patient, she states that she is suppose to see HF clinic tomorrow (07/23). She states when she was here on 07/18- it was mentioned to her about a lot of things she did not know she had "having complete blockage". She is not sure if this visit is needed tomorrow, but she is planning to go. She would just like for Dr.Klein to review the notes from her visit and make sure he agrees, and he is the only one that she trust. I did advise with patient the notes from that visit, however patient request a message be sent to MD.   Will route.  Thanks!

## 2022-10-13 ENCOUNTER — Ambulatory Visit: Payer: Medicare Other | Attending: Cardiology | Admitting: Cardiology

## 2022-10-13 ENCOUNTER — Encounter: Payer: Self-pay | Admitting: Oncology

## 2022-10-13 ENCOUNTER — Other Ambulatory Visit (HOSPITAL_COMMUNITY): Payer: Self-pay

## 2022-10-13 VITALS — BP 141/72 | HR 77 | Ht 65.0 in | Wt 173.0 lb

## 2022-10-13 DIAGNOSIS — I2510 Atherosclerotic heart disease of native coronary artery without angina pectoris: Secondary | ICD-10-CM | POA: Diagnosis not present

## 2022-10-13 DIAGNOSIS — Z9889 Other specified postprocedural states: Secondary | ICD-10-CM

## 2022-10-13 DIAGNOSIS — I482 Chronic atrial fibrillation, unspecified: Secondary | ICD-10-CM | POA: Diagnosis not present

## 2022-10-13 DIAGNOSIS — I5020 Unspecified systolic (congestive) heart failure: Secondary | ICD-10-CM | POA: Diagnosis not present

## 2022-10-13 MED ORDER — EMPAGLIFLOZIN 10 MG PO TABS: 10.0000 mg | ORAL_TABLET | Freq: Every day | ORAL | 6 refills | Status: DC

## 2022-10-13 MED ORDER — LOSARTAN POTASSIUM 25 MG PO TABS: 25.0000 mg | ORAL_TABLET | Freq: Every day | ORAL | 3 refills | Status: DC

## 2022-10-13 MED ORDER — FUROSEMIDE 40 MG PO TABS: 40.0000 mg | ORAL_TABLET | Freq: Every day | ORAL | 3 refills | Status: DC

## 2022-10-13 NOTE — Patient Instructions (Signed)
Medication Changes:  Start Losartan 25 mg (1 tablet) daily at bedtime.   Take Lasix 40 mg (1 tablet) 2 times a day for 2 days. Then take 40 (1 tablet) 1 time daily.   Start Jardiance 10 mg (1 tablet) daily   Special Instructions // Education:  Do the following things EVERYDAY: Weigh yourself in the morning before breakfast. Write it down and keep it in a log. Take your medicines as prescribed Eat low salt foods--Limit salt (sodium) to 2000 mg per day.  Stay as active as you can everyday Limit all fluids for the day to less than 2 liters   Follow-Up in: follow up in 1 month.    If you have any questions or concerns before your next appointment please send [name] a message through [place] or call our office at [phone] Monday-Friday 8 am-5 pm.   If you have an urgent need after hours on the weekend please call your Primary Cardiologist or the Advanced Heart Failure Clinic in [place] at [phone].

## 2022-10-13 NOTE — Progress Notes (Signed)
ADVANCED HEART FAILURE CLINIC NOTE  Referring Physician: Tower, Audrie Gallus, MD  Primary Care: Tower, Audrie Gallus, MD Primary Cardiologist: Dr. Graciela Husbands  HPI: [name] is a 89 y.o. female with permanent atrial fibrillation,AFL s/p ablation ([name]) heart failure with recovered ejection fraction, mitral regurgitation status post MVR, history of AV node ablation with permanent pacemaker implant, history of stroke presenting today to establish care. Her cardiac history dates back to at least 2001 when she underwent mitral valve repair with annuloplasty ring at [facility]. She did well for several years until she was diagnosed with atrial fibrillation resistent to anti-arrythmics.  Her atrial fibrillation was believed to be secondary to atriotomy from AFL ablation.  She eventually underwent AV nodal ablation with placement of single-chamber permanent pacemaker.  Once more she did fairly well with improvement in functional status until the past several weeks to months when she has had a steady decline.  She had an echocardiogram in May 2024 due to chest pain and shortness of breath.  Echo was significant for drop in EF to 25 to 30%.  Follow-up heart catheterization with CTO of the small PLV branch vessel but otherwise no significant CAD.  Since that time she attempts to remain as active as possible however becomes fatigued after any moderate exertion.    Activity level/exercise tolerance:  NYHA IIB Orthopnea:  Sleeps on 1 pillows Paroxysmal noctural dyspnea:  No Chest pain/pressure:  No Orthostatic lightheadedness:  Very infrequent Palpitations:  No Lower extremity edema:  1-2+ Presyncope/syncope:  No Cough:  No  Past Medical History:  Diagnosis Date   Allergic rhinitis    Alopecia 2/2 beta blockers    Anemia    Arthritis    Atrial fibrillation -persistent cardiologist-  dr klein/  primary EP -- dr Julian Hy ([facility])   a. s/p PVI [name] 2010;  b. on tikosyn/coumadin;  c. 05/2009 Echo: EF  60-65%, Gr 2 DD. (first dx 09/ 2007)   Bilateral lower extremity edema    Bleeding hemorrhoid    Carotid stenosis    mild (hosp 3/11)- consult by vasc/ Dr Arbie Cookey   Complication of anesthesia    hard to wake   Diverticulosis of colon    Dyspnea    on exertion-climbing stairs   Fatty liver    H/O cardiac radiofrequency ablation    01/ 2008 at [place] [facility] [place] /  03/ 2010  at [facility] failure with preserved ejection fraction [facility])    History of adenomatous polyp of colon    tubular adenoma's   History of cardiomyopathy    secondary tachycardia-induced cardiomyopathy -- resolved 2014   History of squamous cell carcinoma in situ (SCCIS) of skin    05/ 2017  nasal bridge and right medial knee   History of transient ischemic attack (TIA)    01-24-2005 and 06-12-2009   Hyperlipidemia    Hypothyroidism    Mild intermittent asthma    reacts to cats   Mixed stress and urge urinary incontinence    Presence of permanent cardiac pacemaker    was put in 07/2019   Pulmonary nodule    S/P AV nodal ablation 07/28/19 07/29/2019   S/P mitral valve repair 10-23-1998  dr Elsie Ra at [facility]   for MVP and regurg. (annuloplasty ring procedure)   S/P placement of cardiac pacemaker MDT 07/28/19 07/29/2019    Current Outpatient Medications  Medication Sig Dispense Refill   albuterol (VENTOLIN HFA) 108 (90 Base) MCG/ACT inhaler Inhale 1-2  puffs into the lungs every 6 (six) hours as needed for wheezing or shortness of breath. 18 g 2   bisoprolol (ZEBETA) 5 MG tablet Take 0.5 tablets (2.5 mg total) by mouth daily. 30 tablet 2   ferrous sulfate 325 (65 FE) MG EC tablet TAKE 1 TABLET EVERY DAY WITH BREAKFAST 90 tablet 2   furosemide (LASIX) 40 MG tablet Take 40 mg by mouth daily as needed for edema.     levothyroxine (SYNTHROID) 75 MCG tablet TAKE 1 TABLET EVERY DAY BEFORE BREAKFAST 90 tablet 1   warfarin (COUMADIN) 5 MG tablet TAKE 1 TABLET DAILY OR AS DIRECTED BY COUMADIN CLINIC 105 tablet 1    Acetaminophen (TYLENOL PO) Take 1 tablet by mouth as needed. (Patient not taking: Reported on 10/13/2022)     cetirizine (ZYRTEC) 10 MG tablet Take 5 mg by mouth daily as needed. (Patient not taking: Reported on 09/21/2022)     diphenhydrAMINE-zinc acetate (BENADRYL) cream Apply 1 application topically 3 (three) times daily as needed for itching. (Patient not taking: Reported on 09/21/2022)     enoxaparin (LOVENOX) 80 MG/0.8ML injection Inject 0.8 mLs (80 mg total) into the skin every 12 (twelve) hours. (Patient not taking: Reported on 10/08/2022) 13.6 mL 0   fluticasone (FLONASE) 50 MCG/ACT nasal spray Place 1 spray into both nostrils 2 (two) times daily as needed for allergies or rhinitis. (Patient not taking: Reported on 09/21/2022) 16 g 11   Polyethyl Glycol-Propyl Glycol 0.4-0.3 % SOLN Place 1-2 drops into both eyes 3 (three) times daily as needed (for dry eyes.). (Patient not taking: Reported on 10/08/2022)     triamcinolone cream (KENALOG) 0.5 % Apply 1 Application topically 2 (two) times daily as needed. (Patient not taking: Reported on 09/21/2022) 60 g 1   VITAMIN D PO Take by mouth daily. (Patient not taking: Reported on 10/08/2022)     No current facility-administered medications for this visit.    Allergies  Allergen Reactions   Amiodarone Swelling    SWELLING REACTION UNSPECIFIED    Penicillins Hives and Rash    Has patient had a PCN reaction causing immediate rash, facial/tongue/throat swelling, SOB or lightheadedness with hypotension: No Has patient had a PCN reaction causing severe rash involving mucus membranes or skin necrosis: No Has patient had a PCN reaction that required hospitalization:Patient was inpatient when reaction occurred Has patient had a PCN reaction occurring within the last 10 years: No If all of the above answers are "NO", then may proceed with Cephalosporin use   Metoprolol Other (See Comments)    Hair loss   Statins Rash    REACTION: rash      Social History    Socioeconomic History   Marital status: Widowed    Spouse name: Not on file   Number of children: 6   Years of education: Not on file   Highest education level: Not on file  Occupational History   Occupation: realtor    Employer: RETIRED  Tobacco Use   Smoking status: Never   Smokeless tobacco: Never  Vaping Use   Vaping status: Never Used  Substance and Sexual Activity   Alcohol use: Not Currently    Comment: seldom   Drug use: No   Sexual activity: Not Currently  Other Topics Concern   Not on file  Social History Narrative   Retired. Daily Caffeine use: 2 daily    Social Determinants of Health   Financial Resource Strain: Low Risk  (03/18/2022)   Overall Financial Resource  Strain (CARDIA)    Difficulty of Paying Living Expenses: Not hard at all  Food Insecurity: No Food Insecurity (03/18/2022)   Hunger Vital Sign    Worried About Running Out of Food in the Last Year: Never true    Ran Out of Food in the Last Year: Never true  Transportation Needs: No Transportation Needs (03/18/2022)   PRAPARE - Administrator, Civil Service (Medical): No    Lack of Transportation (Non-Medical): No  Physical Activity: Inactive (03/10/2021)   Exercise Vital Sign    Days of Exercise per Week: 0 days    Minutes of Exercise per Session: 0 min  Stress: No Stress Concern Present (03/18/2022)   [employer] of Occupational Health - Occupational Stress Questionnaire    Feeling of Stress : Only a little  Social Connections: Moderately Integrated (03/18/2022)   Social Connection and Isolation Panel [NHANES]    Frequency of Communication with Friends and Family: More than three times a week    Frequency of Social Gatherings with Friends and Family: Three times a week    Attends Religious Services: More than 4 times per year    Active Member of Clubs or Organizations: Yes    Attends Banker Meetings: More than 4 times per year    Marital Status: Widowed   Intimate Partner Violence: Not At Risk (03/18/2022)   Humiliation, Afraid, Rape, and Kick questionnaire    Fear of Current or Ex-Partner: No    Emotionally Abused: No    Physically Abused: No    Sexually Abused: No      Family History  Problem Relation Age of Onset   Lung cancer Father        smoker, died at 12   Alcohol abuse Father    Cancer Father        bladder and lung CA smoker   Sudden death Other    Breast cancer Neg Hx    Stroke Neg Hx     PHYSICAL EXAM: Vitals:   10/13/22 1113  BP: (!) 141/72  Pulse: 77  SpO2: 95%   GENERAL: Well nourished, well developed, and in no apparent distress at rest.  HEENT: Negative for arcus senilis or xanthelasma. There is no scleral icterus.  The mucous membranes are pink and moist.   NECK: Supple, No masses. Normal carotid upstrokes without bruits. No masses or thyromegaly.    CHEST: There are no chest wall deformities. There is no chest wall tenderness. Respirations are unlabored.  Lungs- CTA B/L CARDIAC:  JVP: 7 cm H2O         Normal S1, S2  Normal rate with regular rhythm. No murmurs, rubs or gallops.  Pulses are 2+ and symmetrical in upper and lower extremities. No edema.  ABDOMEN: Soft, non-tender, non-distended. There are no masses or hepatomegaly. There are normal bowel sounds.  EXTREMITIES: Warm and well perfused with no cyanosis, clubbing.  LYMPHATIC: No axillary or supraclavicular lymphadenopathy.  NEUROLOGIC: Patient is oriented x3 with no focal or lateralizing neurologic deficits.  PSYCH: Patients affect is appropriate, there is no evidence of anxiety or depression.  SKIN: Warm and dry; no lesions or wounds.   DATA REVIEW  ECG: 10/08/22: atrial fibrillation with RV pacing  As per my personal interpretation  ECHO: 08/18/22: LVEF 35% with significant dyssynchrony due to RV pacing As per my personal interpretation  CATH: 09/21/22:  3rd RPL lesion is 100% stenosed.   There is moderate to severe left ventricular  systolic  dysfunction.   LV end diastolic pressure is normal.   The left ventricular ejection fraction is 25-35% by visual estimate.   1.  No significant coronary artery disease with the exception of an occluded small RPL branch with collaterals from the left circumflex. 2.  Moderately to severely reduced LV systolic function. 3.  Right heart catheterization showed high normal filling pressures, normal pulmonary pressure and normal cardiac output.   RA: 4 mmHg PW: 11/22 with a mean of 13 mmHg.  Prominent V wave suggestive of at least moderate mitral regurgitation. PA: 31/13 with a mean of 20 mmHg Cardiac output: 4.72 with an index of 2.55.   ASSESSMENT & PLAN:  Heart failure with reduced ejection fraction Etiology of HF:HF likely secondary to chronic RV pacing; most recent device interrogation from 09/16/22 with >90% RV pacing consistent with history of AVN ablation.  NYHA class / AHA Stage:IIB Volume status & Diuretics: Euvolemic Vasodilators:start Entresto 24/26mg  BID Beta-Blocker:bisoprolol 5mg   WUX:LKGMW spironolactone at follow up.  Cardiometabolic:start farxiga 10mg  Devices therapies & Valvulopathies:Permanent atrial fibrillation s/p AVN with single chamber PPM.  Advanced therapies:Not indicated.   2.  Permanent atrial fibrillation -Previously on rhythm control with Tikosyn however discontinued due to progression to permanent AF. -AV node ablation in May 2021 with permanent pacemaker placement -History of stroke due to subtherapeutic INR.  3. MR s/p mitral annuloplasty - Details not available; TEE from 2021 with prosthetic annuloplasty ring from 10/23/1998.    Paxton Kanaan Advanced Heart Failure Mechanical Circulatory Support

## 2022-10-15 ENCOUNTER — Ambulatory Visit (INDEPENDENT_AMBULATORY_CARE_PROVIDER_SITE_OTHER): Payer: Medicare Other | Admitting: Physician Assistant

## 2022-10-15 VITALS — BP 125/73 | HR 84 | Ht 65.0 in | Wt 173.0 lb

## 2022-10-15 DIAGNOSIS — R3121 Asymptomatic microscopic hematuria: Secondary | ICD-10-CM

## 2022-10-15 LAB — URINALYSIS, COMPLETE
Bilirubin, UA: NEGATIVE
Ketones, UA: NEGATIVE
Nitrite, UA: NEGATIVE
Protein,UA: NEGATIVE
Specific Gravity, UA: <1.005 — ABNORMAL LOW (ref 1.005–1.030)
Urobilinogen, Ur: 0.2 mg/dL (ref 0.2–1.0)
pH, UA: 5 (ref 5.0–7.5)

## 2022-10-15 LAB — MICROSCOPIC EXAMINATION

## 2022-10-15 NOTE — Progress Notes (Signed)
10/15/2022 1:41 PM   [name] 05/12/1932 [id number]  CC: Chief Complaint  Patient presents with   Follow-up   Hematuria    HPI: [name] is a 89 y.o. female with PMH A-fib on Coumadin and OAB wet not on pharmacotherapy who I recently evaluated for possible microscopic hematuria in the setting of 2 positive UA dips who presents today for repeat UA.   When I last saw her in clinic 3 months ago, we catheterized her for a sample and urine microscopy was notable for 3-10 RBCs/hpf.  Urine cytology was negative.  I recommended follow-up voided specimen in 3 months.  Today she reports no dysuria, flank pain, or gross hematuria since she was last seen.  She has no acute concerns.  In-office UA today positive for 3+ glucose, 1+ blood, and 1+ leukocytes; urine microscopy with 6-10 WBCs/HPF, granular and hyaline casts, and moderate bacteria.  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 s/p PVI [name] 2010;  b. on tikosyn/coumadin;  c. 05/2009 Echo: EF 60-65%, Gr 2 DD. (first dx 09/ 2007)   Bilateral lower extremity edema    Bleeding hemorrhoid    Carotid stenosis    mild (hosp 3/11)- consult by vasc/ Dr Arbie Cookey   Complication of anesthesia    hard to wake   Diverticulosis of colon    Dyspnea    on exertion-climbing stairs   Fatty liver    H/O cardiac radiofrequency ablation    01/ 2008 at [place] [facility] [place] /  03/ 2010  at [facility] failure with preserved ejection fraction [facility])    History of adenomatous polyp of colon    tubular adenoma's   History of cardiomyopathy    secondary tachycardia-induced cardiomyopathy -- resolved 2014   History of squamous cell carcinoma in situ (SCCIS) of skin    05/ 2017  nasal bridge and right medial knee   History of transient ischemic attack (TIA)     01-24-2005 and 06-12-2009   Hyperlipidemia    Hypothyroidism    Mild intermittent asthma    reacts to cats   Mixed stress and urge urinary incontinence    Presence of permanent cardiac pacemaker    was put in 07/2019   Pulmonary nodule    S/P AV nodal ablation 07/28/19 07/29/2019   S/P mitral valve repair 10-23-1998  dr Elsie Ra at [facility]   for MVP and regurg. (annuloplasty ring procedure)   S/P placement of cardiac pacemaker MDT 07/28/19 07/29/2019    Surgical History: Past Surgical History:  Procedure Laterality Date   APPENDECTOMY  1978   AV NODE ABLATION N/A 07/28/2019   Procedure: AV NODE ABLATION;  Surgeon: [name] Salvia, MD;  Location: [facility] INVASIVE CV LAB;  Service: Cardiovascular;  Laterality: N/A;   BUBBLE STUDY  06/19/2019   Procedure: BUBBLE STUDY;  Surgeon: Chrystie Nose, MD;  Location: [facility] ENDOSCOPY;  Service: Cardiovascular;;   CARDIAC ELECTROPHYSIOLOGY MAPPING AND ABLATION  01/ 2008    at [facility] [place]   right-sided ablation atrial flutter   CARDIAC ELECTROPHYSIOLOGY STUDY AND ABLATION  03/ 2010   dr Barrett Shell at [facility]   AV node ablation and pulmonary vein isolation for atrial fib   CARDIOVERSION  06-18-2006;  07-13-2006;  10-19-2010;  10-27-2010   CATARACT EXTRACTION W/PHACO Right 04/09/2021   Procedure: CATARACT EXTRACTION PHACO AND INTRAOCULAR LENS PLACEMENT (IOC) RIGHT 6.71 01:11.1;  Surgeon: Lockie Mola, MD;  Location: [facility] SURGERY CNTR;  Service: Ophthalmology;  Laterality: Right;   CATARACT EXTRACTION W/PHACO Left 04/23/2021   Procedure: CATARACT EXTRACTION PHACO AND INTRAOCULAR LENS PLACEMENT (IOC) LEFT;  Surgeon: Lockie Mola, MD;  Location: [facility] SURGERY CNTR;  Service: Ophthalmology;  Laterality: Left;  [place] 5.65 00:50.1   COLONOSCOPY     COLONOSCOPY WITH PROPOFOL N/A 10/13/2017   Procedure: COLONOSCOPY WITH PROPOFOL;  Surgeon: Wyline Mood, MD;  Location: [facility] ENDOSCOPY;  Service: Gastroenterology;  Laterality: N/A;    COLONOSCOPY WITH PROPOFOL N/A 02/20/2020   Procedure: COLONOSCOPY WITH PROPOFOL;  Surgeon: Regis Bill, MD;  Location: [facility] ENDOSCOPY;  Service: Endoscopy;  Laterality: N/A;   CYSTO/ TRANSURETHRAL COLLAGEN INJECTION THERAPY  07-26-2007   dr Sherron Monday   DILATION AND CURETTAGE OF UTERUS     ESOPHAGOGASTRODUODENOSCOPY (EGD) WITH PROPOFOL N/A 10/13/2017   Procedure: ESOPHAGOGASTRODUODENOSCOPY (EGD) WITH PROPOFOL;  Surgeon: Wyline Mood, MD;  Location: [facility] ENDOSCOPY;  Service: Gastroenterology;  Laterality: N/A;   EVALUATION UNDER ANESTHESIA WITH HEMORRHOIDECTOMY N/A 04/09/2020   Procedure: EXAM UNDER ANESTHESIA WITH HEMORRHOIDECTOMY;  Surgeon: Leafy Ro, MD;  Location: [facility] ORS;  Service: General;  Laterality: N/A;   EXCISIONAL HEMORRHOIDECTOMY  1980s   GIVENS CAPSULE STUDY N/A 12/08/2017   Procedure: GIVENS CAPSULE STUDY;  Surgeon: Wyline Mood, MD;  Location: [facility] ENDOSCOPY;  Service: Gastroenterology;  Laterality: N/A;   HEMORRHOID SURGERY N/A 10/29/2016   Procedure: HEMORRHOIDECTOMY;  Surgeon: Romie Levee, MD;  Location: [facility];  Service: General;  Laterality: N/A;   MITRAL VALVE ANNULOPLASTY  10/23/1998   "Model [id number]; Ronni Rumble [id number]"; size 32mm; [facility]; Dr. Elsie Ra   PACEMAKER IMPLANT N/A 07/28/2019   Procedure: PACEMAKER IMPLANT;  Surgeon: [name] Salvia, MD;  Location: [facility] INVASIVE CV LAB;  Service: Cardiovascular;  Laterality: N/A;   PILONIDAL CYST EXCISION  1954   RIGHT/LEFT HEART CATH AND CORONARY ANGIOGRAPHY Bilateral 09/21/2022   Procedure: RIGHT/LEFT HEART CATH AND CORONARY ANGIOGRAPHY;  Surgeon: Iran Ouch, MD;  Location: [facility] INVASIVE CV LAB;  Service: Cardiovascular;  Laterality: Bilateral;   TEE WITH CARDIOVERSION  05-06-2006 at [facility];  01-02-2013 at [facility]   TEE WITHOUT CARDIOVERSION N/A 06/19/2019   Procedure: TRANSESOPHAGEAL ECHOCARDIOGRAM (TEE);  Surgeon: Chrystie Nose, MD;  Location: [facility] ENDOSCOPY;  Service: Cardiovascular;   Laterality: N/A;   TOTAL HIP ARTHROPLASTY Left 05/04/2017   Procedure: LEFT TOTAL HIP ARTHROPLASTY ANTERIOR APPROACH;  Surgeon: Kathryne Hitch, MD;  Location: [facility] OR;  Service: Orthopedics;  Laterality: Left;   TRANSTHORACIC ECHOCARDIOGRAM  05-01-2015   dr Graciela Husbands   ef 50-55%/  mild AV sclerosis without stenosis/  post MV repair with mild central MR (valve area by pressure half-time 2cm^2,  valve area by continutity equation 0.91cm^2, peak grandiant 25mmHg)/  severe LAE/ mild TR/ mild RAE    TUBAL LIGATION Bilateral 1978    Home Medications:  Allergies as of 10/15/2022       Reactions   Amiodarone Swelling   SWELLING REACTION UNSPECIFIED    Penicillins Hives, Rash   Has patient had a PCN reaction causing immediate rash, facial/tongue/throat swelling, SOB or lightheadedness with hypotension: No Has patient had a PCN reaction causing severe rash involving mucus membranes or skin necrosis: No Has patient had a PCN reaction that required hospitalization:Patient was inpatient when reaction occurred Has patient had a PCN reaction  occurring within the last 10 years: No If all of the above answers are "NO", then may proceed with Cephalosporin use   Metoprolol Other (See Comments)   Hair loss   Statins Rash   REACTION: rash        Medication List        Accurate as of October 15, 2022  1:41 PM. If you have any questions, ask your nurse or doctor.          STOP taking these medications    cetirizine 10 MG tablet Commonly known as: ZYRTEC   diphenhydrAMINE-zinc acetate cream Commonly known as: BENADRYL   enoxaparin 80 MG/0.8ML injection Commonly known as: Lovenox       TAKE these medications    albuterol 108 (90 Base) MCG/ACT inhaler Commonly known as: VENTOLIN HFA Inhale 1-2 puffs into the lungs every 6 (six) hours as needed for wheezing or shortness of breath.   bisoprolol 5 MG tablet Commonly known as: ZEBETA Take 0.5 tablets (2.5 mg total) by mouth daily.    empagliflozin 10 MG Tabs tablet Commonly known as: Jardiance Take 1 tablet (10 mg total) by mouth daily before breakfast.   ferrous sulfate 325 (65 FE) MG EC tablet TAKE 1 TABLET EVERY DAY WITH BREAKFAST   fluticasone 50 MCG/ACT nasal spray Commonly known as: FLONASE Place 1 spray into both nostrils 2 (two) times daily as needed for allergies or rhinitis.   furosemide 40 MG tablet Commonly known as: LASIX Take 1 tablet (40 mg total) by mouth daily.   levothyroxine 75 MCG tablet Commonly known as: SYNTHROID TAKE 1 TABLET EVERY DAY BEFORE BREAKFAST   losartan 25 MG tablet Commonly known as: COZAAR Take 1 tablet (25 mg total) by mouth at bedtime.   Polyethyl Glycol-Propyl Glycol 0.4-0.3 % Soln Place 1-2 drops into both eyes 3 (three) times daily as needed (for dry eyes.).   triamcinolone cream 0.5 % Commonly known as: KENALOG Apply 1 Application topically 2 (two) times daily as needed.   TYLENOL PO Take 1 tablet by mouth as needed.   VITAMIN D PO Take by mouth daily.   warfarin 5 MG tablet Commonly known as: COUMADIN Take as directed by the anticoagulation clinic. If you are unsure how to take this medication, talk to your nurse or doctor. Original instructions: TAKE 1 TABLET DAILY OR AS DIRECTED BY COUMADIN CLINIC        Allergies:  Allergies  Allergen Reactions   Amiodarone Swelling    SWELLING REACTION UNSPECIFIED    Penicillins Hives and Rash    Has patient had a PCN reaction causing immediate rash, facial/tongue/throat swelling, SOB or lightheadedness with hypotension: No Has patient had a PCN reaction causing severe rash involving mucus membranes or skin necrosis: No Has patient had a PCN reaction that required hospitalization:Patient was inpatient when reaction occurred Has patient had a PCN reaction occurring within the last 10 years: No If all of the above answers are "NO", then may proceed with Cephalosporin use   Metoprolol Other (See Comments)     Hair loss   Statins Rash    REACTION: rash    Family History: Family History  Problem Relation Age of Onset   Lung cancer Father        smoker, died at 81   Alcohol abuse Father    Cancer Father        bladder and lung CA smoker   Sudden death Other    Breast cancer Neg Hx  Stroke Neg Hx     Social History:   reports that she has never smoked. She has never used smokeless tobacco. She reports that she does not currently use alcohol. She reports that she does not use drugs.  Physical Exam: BP 125/73   Pulse 84   Ht 5\' 5"  (1.651 m)   Wt 173 lb (78.5 kg)   BMI 28.79 kg/m   Constitutional:  Alert and oriented, no acute distress, nontoxic appearing HEENT: [place], AT Cardiovascular: No clubbing, cyanosis, or edema Respiratory: Normal respiratory effort, no increased work of breathing Skin: No rashes, bruises or suspicious lesions Neurologic: Grossly intact, no focal deficits, moving all 4 extremities Psychiatric: Normal mood and affect  Laboratory Data: Results for orders placed or performed in visit on 10/15/22  Microscopic Examination   Urine  Result Value Ref Range   WBC, UA 6-10 (A) 0 - 5 /hpf   RBC, Urine 0-2 0 - 2 /hpf   Epithelial Cells (non renal) 0-10 0 - 10 /hpf   Casts Present (A) None seen /lpf   Cast Type Granular casts (A) N/A   Mucus, UA Present (A) Not Estab.   Bacteria, UA Moderate (A) None seen/Few  Urinalysis, Complete  Result Value Ref Range   Specific Gravity, UA <1.005 (L) 1.005 - 1.030   pH, UA 5.0 5.0 - 7.5   Color, UA Yellow Yellow   Appearance Ur Hazy (A) Clear   Leukocytes,UA 1+ (A) Negative   Protein,UA Negative Negative/Trace   Glucose, UA 3+ (A) Negative   Ketones, UA Negative Negative   RBC, UA 1+ (A) Negative   Bilirubin, UA Negative Negative   Urobilinogen, Ur 0.2 0.2 - 1.0 mg/dL   Nitrite, UA Negative Negative   Microscopic Examination See below:    *Note: Due to a large number of results and/or encounters for the requested  time period, some results have not been displayed. A complete set of results can be found in Results Review.   Assessment & Plan:   1. Asymptomatic microscopic hematuria No micro heme on voided urine specimen today.  At this point, I do not think she has true microscopic hematuria.  For reassurance, I offered her a follow-up with me in 1 year with repeat voided UA and she agreed.  We discussed return precautions including dysuria, flank pain, and gross hematuria. - Urinalysis, Complete  Return in about 1 year (around 10/15/2023) for Annual f/u with UA.  Carman Ching, PA-C  [facility] [address], [address] [place], [place] [address] [phone]

## 2022-10-23 ENCOUNTER — Ambulatory Visit: Payer: Medicare Other

## 2022-10-23 DIAGNOSIS — Z9889 Other specified postprocedural states: Secondary | ICD-10-CM | POA: Diagnosis not present

## 2022-10-23 LAB — CUP PACEART REMOTE DEVICE CHECK
Battery Remaining Longevity: 99 mo
Battery Voltage: 3.01 V
Brady Statistic AP VP Percent: 0 %
Brady Statistic AP VS Percent: 0 %
Brady Statistic AS VP Percent: 88.42 %
Brady Statistic AS VS Percent: 11.58 %
Brady Statistic RA Percent Paced: 0 %
Brady Statistic RV Percent Paced: 88.42 %
Date Time Interrogation Session: 20240802001732
Implantable Lead Connection Status: 753985
Implantable Lead Implant Date: 20210507
Implantable Lead Location: 753860
Implantable Lead Model: 5076
Implantable Pulse Generator Implant Date: 20210507
Lead Channel Impedance Value: 3306 Ohm
Lead Channel Impedance Value: 3306 Ohm
Lead Channel Impedance Value: 399 Ohm
Lead Channel Impedance Value: 437 Ohm
Lead Channel Pacing Threshold Amplitude: 0.75 V
Lead Channel Pacing Threshold Pulse Width: 0.4 ms
Lead Channel Sensing Intrinsic Amplitude: 1.875 mV
Lead Channel Sensing Intrinsic Amplitude: 12.75 mV
Lead Channel Sensing Intrinsic Amplitude: 12.75 mV
Lead Channel Setting Pacing Amplitude: 2.5 V
Lead Channel Setting Pacing Pulse Width: 0.4 ms
Lead Channel Setting Sensing Sensitivity: 4 mV
Zone Setting Status: 755011

## 2022-10-29 ENCOUNTER — Ambulatory Visit (INDEPENDENT_AMBULATORY_CARE_PROVIDER_SITE_OTHER): Payer: Medicare Other

## 2022-10-29 DIAGNOSIS — Z7901 Long term (current) use of anticoagulants: Secondary | ICD-10-CM

## 2022-10-29 LAB — POCT INR: INR: 2 (ref 2.0–3.0)

## 2022-10-29 NOTE — Progress Notes (Signed)
When reviewing dosing with pt she could not remember taking any 1/2 tablets. She fills her pill box every Saturday so she cannot look at her pill box to check. After this discovery, and since pt has been at the very low end of her range for the last 2 INR checks, pt will return in 1 week using the dosing below. Continue 1 tablet daily except take 1/2 tablet on Sundays and Wednesdays . Recheck in 3 weeks.

## 2022-10-29 NOTE — Patient Instructions (Addendum)
Pre visit review using our clinic review tool, if applicable. No additional management support is needed unless otherwise documented below in the visit note.  Continue 1 tablet daily except take 1/2 tablet on Sundays and Wednesdays . Recheck in 3 weeks.

## 2022-11-02 NOTE — Progress Notes (Signed)
Remote pacemaker transmission.   

## 2022-11-03 ENCOUNTER — Encounter: Payer: Self-pay | Admitting: Internal Medicine

## 2022-11-03 ENCOUNTER — Ambulatory Visit: Payer: Medicare Other | Attending: Internal Medicine | Admitting: Internal Medicine

## 2022-11-03 VITALS — BP 112/68 | HR 73 | Ht 65.0 in | Wt 172.4 lb

## 2022-11-03 DIAGNOSIS — I482 Chronic atrial fibrillation, unspecified: Secondary | ICD-10-CM | POA: Diagnosis present

## 2022-11-03 DIAGNOSIS — I2510 Atherosclerotic heart disease of native coronary artery without angina pectoris: Secondary | ICD-10-CM

## 2022-11-03 DIAGNOSIS — I5020 Unspecified systolic (congestive) heart failure: Secondary | ICD-10-CM | POA: Diagnosis not present

## 2022-11-03 DIAGNOSIS — Z9889 Other specified postprocedural states: Secondary | ICD-10-CM | POA: Diagnosis present

## 2022-11-03 LAB — CBC
Hematocrit: 40.3 % (ref 34.0–46.6)
Hemoglobin: 13.1 g/dL (ref 11.1–15.9)
MCH: 28.9 pg (ref 26.6–33.0)
MCHC: 32.5 g/dL (ref 31.5–35.7)
MCV: 89 fL (ref 79–97)
Platelets: 159 10*3/uL (ref 150–450)
RBC: 4.53 x10E6/uL (ref 3.77–5.28)
RDW: 14.3 % (ref 11.7–15.4)
WBC: 5.3 10*3/uL (ref 3.4–10.8)

## 2022-11-03 MED ORDER — MEXILETINE HCL 200 MG PO CAPS: 200.0000 mg | ORAL_CAPSULE | Freq: Two times a day (BID) | ORAL | 3 refills | Status: DC

## 2022-11-03 NOTE — Patient Instructions (Addendum)
Medication Instructions:  STOP Jardiance  START Mexiletine 200 mg twice daily   *If you need a refill on your cardiac medications before your next appointment, please call your pharmacy*  LAB: Your provider would like for you to have following labs drawn today CBC.    Follow-Up: At [facility], you and your health needs are our priority.  As part of our continuing mission to provide you with exceptional heart care, we have created designated Provider Care Teams.  These Care Teams include your primary Cardiologist (physician) and Advanced Practice Providers (APPs -  Physician Assistants and Nurse Practitioners) who all work together to provide you with the care you need, when you need it.  We recommend signing up for the patient portal called "MyChart".  Sign up information is provided on this After Visit Summary.  MyChart is used to connect with patients for Virtual Visits (Telemedicine).  Patients are able to view lab/test results, encounter notes, upcoming appointments, etc.  Non-urgent messages can be sent to your provider as well.   To learn more about what you can do with MyChart, go to [email or web address].    Your next appointment:   2 week(s)  Provider:   Sherie Don, NP

## 2022-11-03 NOTE — Progress Notes (Signed)
Electrophysiology Office Note   Date:  11/03/2022   ID:  [name], DOB 1932/08/03, MRN [id number]  PCP:  [name], MD  Cardiologist:   Primary Electrophysiologist:  Sherryl Manges, MD    No chief complaint on file.     History of Present Illness: [name] is a 89 y.o. female seen for atrial fibrillation.  Now permanent.  Anticoagulation with warfarin.  And with AV junction ablation and pacing 5/21. no bleeding  fee;s cold  Stroke 2/21.     Continues to complain of dyspnea on exertion; while she says it was not a true a year ago my note from the December 23 reports that.  No edema orthopnea or nocturnal dyspnea.  Also had an experience when she got near her new magnetic stove where she all of a sudden felt heavy; since then she has a sensation that her body and her mind are somewhat disconnected 1 from the other and that her memory has been affected.  Reviewing her chart amiodarone is listed as an intolerance dating back to 04-Dec-2010 associated with hypothyroidism" swelling "  Her   son, [name], 12/03/20  She found him dead in the home.    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 s/p PVI [name] 12/03/08;  b. on tikosyn/coumadin;  c. 05/2009 Echo: EF 60-65%, Gr 2 DD. (first dx 09/ 12-03-2005)   Bilateral lower extremity edema    Bleeding hemorrhoid    Carotid stenosis    mild (hosp 3/11)- consult by vasc/ Dr Arbie Cookey   Complication of  anesthesia    hard to wake   Diverticulosis of colon    Dyspnea    on exertion-climbing stairs   Fatty liver    H/O cardiac radiofrequency ablation    01/ 2006-12-04 at [place] [facility] [place] /  03/ December 03, 2008  at [facility] failure with preserved ejection fraction [facility])    History of adenomatous polyp of colon    tubular adenoma's   History of cardiomyopathy    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 (annuloplasty ring procedure)   S/P placement of cardiac pacemaker MDT 07/28/19 07/29/2019   Past Surgical History:  Procedure Laterality Date   APPENDECTOMY  1978   AV NODE ABLATION N/A 07/28/2019   Procedure: AV NODE ABLATION;  Surgeon: [name] Salvia, MD;  Location: [facility] INVASIVE CV LAB;  Service: Cardiovascular;  Laterality: N/A;   BUBBLE STUDY  06/19/2019   Procedure: BUBBLE STUDY;  Surgeon: Chrystie Nose, MD;  Location: [facility] ENDOSCOPY;  Service: Cardiovascular;;   CARDIAC ELECTROPHYSIOLOGY MAPPING AND ABLATION  01/ 2008    at [place] [facility] [place]   right-sided ablation atrial flutter   CARDIAC ELECTROPHYSIOLOGY STUDY AND ABLATION  03/ 2010   dr Barrett Shell at [facility]   AV node ablation and pulmonary vein isolation for atrial fib   CARDIOVERSION  06-18-2006;  07-13-2006;  10-19-2010;  10-27-2010   CATARACT EXTRACTION W/PHACO Right 04/09/2021   Procedure: CATARACT EXTRACTION PHACO AND INTRAOCULAR LENS PLACEMENT (IOC) RIGHT 6.71 01:11.1;  Surgeon: Lockie Mola, MD;  Location: [facility];  Service: Ophthalmology;  Laterality: Right;   CATARACT EXTRACTION W/PHACO Left 04/23/2021   Procedure: CATARACT EXTRACTION PHACO AND INTRAOCULAR LENS PLACEMENT (IOC) LEFT;  Surgeon: Lockie Mola, MD;  Location: [facility] SURGERY CNTR;  Service: Ophthalmology;  Laterality: Left;  [place] 5.65 00:50.1   COLONOSCOPY     COLONOSCOPY WITH PROPOFOL N/A 10/13/2017   Procedure: COLONOSCOPY WITH PROPOFOL;  Surgeon: Wyline Mood, MD;  Location: [facility] ENDOSCOPY;  Service: Gastroenterology;  Laterality: N/A;   COLONOSCOPY WITH PROPOFOL N/A 02/20/2020   Procedure: COLONOSCOPY WITH PROPOFOL;  Surgeon: Regis Bill, MD;  Location: [facility] ENDOSCOPY;  Service: Endoscopy;  Laterality: N/A;   CYSTO/ TRANSURETHRAL COLLAGEN INJECTION THERAPY  07-26-2007   dr Sherron Monday   DILATION AND CURETTAGE OF UTERUS     ESOPHAGOGASTRODUODENOSCOPY (EGD) WITH PROPOFOL N/A 10/13/2017   Procedure: ESOPHAGOGASTRODUODENOSCOPY (EGD) WITH PROPOFOL;  Surgeon: Wyline Mood, MD;  Location: [facility] ENDOSCOPY;  Service: Gastroenterology;  Laterality: N/A;   EVALUATION UNDER ANESTHESIA WITH HEMORRHOIDECTOMY N/A 04/09/2020   Procedure: EXAM UNDER ANESTHESIA WITH HEMORRHOIDECTOMY;  Surgeon: Leafy Ro, MD;  Location: [facility] ORS;  Service: General;  Laterality: N/A;   EXCISIONAL HEMORRHOIDECTOMY  1980s   GIVENS CAPSULE STUDY N/A 12/08/2017   Procedure: GIVENS CAPSULE STUDY;  Surgeon: Wyline Mood, MD;  Location: [facility] ENDOSCOPY;  Service: Gastroenterology;  Laterality: N/A;   HEMORRHOID SURGERY N/A 10/29/2016   Procedure: HEMORRHOIDECTOMY;  Surgeon: Romie Levee, MD;  Location: [facility];  Service: General;  Laterality: N/A;   MITRAL VALVE ANNULOPLASTY  10/23/1998   "Model [id number]; Ronni Rumble [id number]"; size 32mm; [facility]; Dr. Elsie Ra   PACEMAKER IMPLANT N/A 07/28/2019   Procedure: PACEMAKER IMPLANT;  Surgeon: [name] Salvia, MD;  Location: [facility] INVASIVE CV LAB;  Service:  Cardiovascular;  Laterality: N/A;   PILONIDAL CYST EXCISION  1954   RIGHT/LEFT HEART CATH AND CORONARY ANGIOGRAPHY Bilateral 09/21/2022   Procedure: RIGHT/LEFT HEART CATH AND CORONARY ANGIOGRAPHY;  Surgeon: Iran Ouch, MD;  Location: [facility] INVASIVE CV LAB;  Service: Cardiovascular;  Laterality: Bilateral;   TEE WITH CARDIOVERSION  05-06-2006 at [facility];  01-02-2013 at [facility]   TEE WITHOUT CARDIOVERSION N/A 06/19/2019   Procedure: TRANSESOPHAGEAL ECHOCARDIOGRAM (TEE);  Surgeon: Chrystie Nose, MD;  Location: [facility] ENDOSCOPY;  Service: Cardiovascular;  Laterality: N/A;   TOTAL HIP ARTHROPLASTY Left 05/04/2017   Procedure: LEFT TOTAL HIP ARTHROPLASTY ANTERIOR APPROACH;  Surgeon: Kathryne Hitch, MD;  Location: [facility] OR;  Service: Orthopedics;  Laterality: Left;   TRANSTHORACIC ECHOCARDIOGRAM  05-01-2015   dr Graciela Husbands   ef 50-55%/  mild AV sclerosis without stenosis/  post MV repair with mild central MR (valve area by pressure half-time 2cm^2,  valve area by continutity equation 0.91cm^2, peak grandiant 26mmHg)/  severe LAE/ mild TR/ mild RAE    TUBAL LIGATION Bilateral 1978     Current Outpatient Medications  Medication Sig Dispense Refill   Acetaminophen (TYLENOL PO) Take 1 tablet by mouth as needed.     albuterol (VENTOLIN HFA) 108 (90 Base) MCG/ACT inhaler Inhale 1-2 puffs into the lungs every 6 (six) hours as needed for wheezing or shortness of breath. 18 g 2   bisoprolol (ZEBETA) 5 MG tablet Take 0.5 tablets (2.5 mg total) by mouth daily. 30 tablet 2   empagliflozin (JARDIANCE) 10 MG TABS tablet Take 1 tablet (10 mg total) by mouth daily before breakfast. 30 tablet 6   ferrous sulfate 325 (65 FE) MG EC tablet TAKE 1 TABLET EVERY DAY WITH BREAKFAST 90 tablet 2   fluticasone (FLONASE) 50 MCG/ACT nasal spray Place 1 spray into both nostrils 2 (two) times daily as needed for allergies or rhinitis. 16 g 11   furosemide (LASIX) 40 MG tablet Take 1 tablet (40 mg total) by mouth daily. 90 tablet 3    levothyroxine (SYNTHROID) 75 MCG tablet TAKE 1 TABLET EVERY DAY BEFORE BREAKFAST 90 tablet 1   losartan (COZAAR) 25 MG tablet Take 1 tablet (25 mg total) by mouth at bedtime. 90 tablet 3   Polyethyl Glycol-Propyl Glycol 0.4-0.3 % SOLN Place 1-2 drops into both eyes 3 (three) times daily as needed (for dry eyes.).     triamcinolone cream (KENALOG) 0.5 % Apply 1 Application topically 2 (two) times daily as needed. 60 g 1   VITAMIN D PO Take by mouth daily.     warfarin (COUMADIN) 5 MG tablet TAKE 1 TABLET DAILY OR AS DIRECTED BY COUMADIN CLINIC 105 tablet 1   No current facility-administered medications for this visit.    Allergies:   Amiodarone, Penicillins, Metoprolol, and Statins  Were evaluated  ROS:  Please see the history of present illness. All other systems are reviewed and negative.    PHYSICAL EXAM: VS:  BP 112/68   Pulse 73   Ht 5\' 5"  (1.651 m)   Wt 172 lb 6.4 oz (78.2 kg)   SpO2 96%   BMI 28.69 kg/m  , BMI Body mass index is 28.69 kg/m. Well developed and well nourished in no acute distress HENT normal Neck supple with JVP 8-10 cm  Clear Device pocket well healed; without hematoma or erythema.  There is no tethering  Regular rate and rhythm, no  gallop No  murmur Abd-soft with active BS No Clubbing cyanosis tr  edema Skin-warm and dry A & Oriented  Grossly normal sensory and motor function  ECG atrial fib V pacing  PVC freq   Device function is normal. Programming changes none  See Paceart for details     ASSESSMENT AND PLAN: Atrial fibrillation-permanent  AV junction ablation  Pacemaker-[employer]tr[employer]  CArdiomyopathy   PVCs greater than 10% as suggested by ventricular pacing percentage  Hypothyroidism-treated     HFrEF  Anemia   Obesity    Progressive left ventricular dysfunction with 2 primary culprits, the first is the PVCs which are of high burden go back a couple of years and the second is ventricular pacing.  Given added risks associated with  procedures, especially  the idea of an upgrade we discussed medical therapy first with reassessment and then CRT upgrade if this is unsuccessful.  She is agreeable to this.   The issue of amiodarone allergy is related to hypothyroidism apparently going back as far as I can and some enigmatic comments about swelling, hence, we will start with mexiletine 200 twice daily.  We have discussed side effects.  We will plan to reassess her ventricular pacing percentage in about 2 weeks to see if there has been an increase from the 88-90% of the last year, the goal would be 95+ percent to mark as effective the use of mexiletine.  If it were effective then we will begin spironolactone as augmented GDMT with a BMET to follow in 2 weeks; if ineffective, I would try amiodarone not withstanding the above mentioned concerns at 200 twice daily with reassessment of ventricular pacing percentage by Zio patch about 4 weeks later and follow-up shortly thereafter  Her blood count is also lower most recently we will recheck it to make sure it has  stabilized; continue warfarin  Volume status is relatively stable continue furosemide   Sherryl Manges, MD  11/03/2022 8:56 AM     [facility] [address] [place] [address] [phone] (office) [phone] (fax)

## 2022-11-05 ENCOUNTER — Ambulatory Visit: Payer: Medicare Other | Admitting: Cardiovascular Disease

## 2022-11-05 ENCOUNTER — Ambulatory Visit (INDEPENDENT_AMBULATORY_CARE_PROVIDER_SITE_OTHER): Payer: Medicare Other

## 2022-11-05 ENCOUNTER — Encounter (INDEPENDENT_AMBULATORY_CARE_PROVIDER_SITE_OTHER): Payer: Self-pay

## 2022-11-05 DIAGNOSIS — Z7901 Long term (current) use of anticoagulants: Secondary | ICD-10-CM | POA: Diagnosis not present

## 2022-11-05 LAB — POCT INR: INR: 2.6 (ref 2.0–3.0)

## 2022-11-05 NOTE — Progress Notes (Signed)
Continue 1 tablet daily except take 1/2 tablet on Sundays and Wednesdays . Recheck in 4 weeks.

## 2022-11-05 NOTE — Patient Instructions (Addendum)
Pre visit review using our clinic review tool, if applicable. No additional management support is needed unless otherwise documented below in the visit note.  Continue 1 tablet daily except take 1/2 tablet on Sundays and Wednesdays . Recheck in 4 weeks.

## 2022-11-16 DIAGNOSIS — D045 Carcinoma in situ of skin of trunk: Secondary | ICD-10-CM | POA: Diagnosis not present

## 2022-11-16 DIAGNOSIS — L820 Inflamed seborrheic keratosis: Secondary | ICD-10-CM | POA: Diagnosis not present

## 2022-11-16 DIAGNOSIS — Z85828 Personal history of other malignant neoplasm of skin: Secondary | ICD-10-CM | POA: Diagnosis not present

## 2022-11-16 DIAGNOSIS — L821 Other seborrheic keratosis: Secondary | ICD-10-CM | POA: Diagnosis not present

## 2022-11-16 DIAGNOSIS — L570 Actinic keratosis: Secondary | ICD-10-CM | POA: Diagnosis not present

## 2022-11-16 DIAGNOSIS — L814 Other melanin hyperpigmentation: Secondary | ICD-10-CM | POA: Diagnosis not present

## 2022-11-17 ENCOUNTER — Encounter: Payer: Self-pay | Admitting: Cardiology

## 2022-11-17 ENCOUNTER — Telehealth: Payer: Self-pay | Admitting: *Deleted

## 2022-11-17 ENCOUNTER — Ambulatory Visit: Payer: Medicare Other | Attending: Cardiology | Admitting: Cardiology

## 2022-11-17 VITALS — BP 124/78 | HR 77 | Ht 65.0 in | Wt 173.5 lb

## 2022-11-17 DIAGNOSIS — I482 Chronic atrial fibrillation, unspecified: Secondary | ICD-10-CM | POA: Diagnosis not present

## 2022-11-17 DIAGNOSIS — I493 Ventricular premature depolarization: Secondary | ICD-10-CM | POA: Diagnosis not present

## 2022-11-17 DIAGNOSIS — I5020 Unspecified systolic (congestive) heart failure: Secondary | ICD-10-CM | POA: Insufficient documentation

## 2022-11-17 DIAGNOSIS — Z950 Presence of cardiac pacemaker: Secondary | ICD-10-CM | POA: Diagnosis not present

## 2022-11-17 DIAGNOSIS — Z9889 Other specified postprocedural states: Secondary | ICD-10-CM | POA: Diagnosis not present

## 2022-11-17 DIAGNOSIS — I4821 Permanent atrial fibrillation: Secondary | ICD-10-CM | POA: Diagnosis not present

## 2022-11-17 LAB — CUP PACEART INCLINIC DEVICE CHECK
Date Time Interrogation Session: 20240827115531
Implantable Lead Connection Status: 753985
Implantable Lead Implant Date: 20210507
Implantable Lead Location: 753860
Implantable Lead Model: 5076
Implantable Pulse Generator Implant Date: 20210507

## 2022-11-17 NOTE — Patient Instructions (Signed)
Medication Instructions:   Your physician recommends that you continue on your current medications as directed. Please refer to the Current Medication list given to you today. *If you need a refill on your cardiac medications before your next appointment, please call your pharmacy*   Lab Work: NONE If you have labs (blood work) drawn today and your tests are completely normal, you will receive your results only by: MyChart Message (if you have MyChart) OR A paper copy in the mail If you have any lab test that is abnormal or we need to change your treatment, we will call you to review the results.   Testing/Procedures: NONE   Follow-Up: At [facility], you and your health needs are our priority.  As part of our continuing mission to provide you with exceptional heart care, we have created designated Provider Care Teams.  These Care Teams include your primary Cardiologist (physician) and Advanced Practice Providers (APPs -  Physician Assistants and Nurse Practitioners) who all work together to provide you with the care you need, when you need it.  We recommend signing up for the patient portal called "MyChart".  Sign up information is provided on this After Visit Summary.  MyChart is used to connect with patients for Virtual Visits (Telemedicine).  Patients are able to view lab/test results, encounter notes, upcoming appointments, etc.  Non-urgent messages can be sent to your provider as well.   To learn more about what you can do with MyChart, go to [email or web address].    Your next appointment:   2 week(s)  Provider:   Sherie Don, NP    Other Instructions  Please contact our office when you get home once you have reviewed your current medication list with your medication bottles at home. We would like for you to give [name] a call so we can verify your medication list.  -Please complete Jardiance patient assistance application and return to our office. We will submit  application for you and will notify you with response of approval.

## 2022-11-17 NOTE — Progress Notes (Signed)
Cardiology Office Note Date:  11/17/2022  Patient ID:  [name], [name] 1932-10-08, MRN [id number] PCP:  [name], MD  Cardiologist:  None Electrophysiologist: Sherryl Manges, MD  Chief Complaint: fatigue  History of Present Illness: [name] is a 89 y.o. female with PMH notable for perm AFib, HFrEF, NICM, VHD s/p MVr, s/p AVN ablation and PPM implant, CVA, PVCs, hypothyroid; seen today for Dr. Graciela Husbands for routine EP follow-up.   She was seen by me 06/2022 for acute visit d/t "chest twinges" snaking across her L chest and increased fatigue. She was found to be in bigeminal PVCs in office. BB was started at that visit, and an updated echo revealed newly reduced LVEF. LHC showed no significant CAD. She was seen by Dr. Gasper Lloyd with Adv HF in 09/2022 to establish care, adjusted GDMT.  She saw Dr. Graciela Husbands 10/2022, she continued to have freq PVCs and given amiodarone allergy/intolerance, he started mexiletine to try to reduce PVC burden. Considering CRT upgrade  On follow-up today, she has not started the mexilitine. She thought the mexitil was to replace the jardiance when the current jardiance rx finished. [name] is 600/month and she is not able to continue to take the medication at that cost.   She does report some improvement in SOB symptoms. Is able to piddle around the house easier. She still does not think she is able to walk to her mailbox (42ft slight incline). She does not feel comfortable doing water aerobics in her pool either. She does plan to increase her gardening now that the weather is slightly cooler and she is feeling somewhat better.    She believes that her current SOB symptoms started with the "shock" she felt when near her oven a few years ago. Continues to feel that her body and mind are somewhat disconnected. She verbalizes that she is likely depressed, her friend group as shrunk d/t illnesses. She is not interested in antidepressant medication.  She is unsure of some  of her medications - bisoprolol, synthroid, losartan. She is nervous about taking too many medications for fear of interactions and OD'ing. Takes warfarin diligently, no bleeding concerns. Managed at PCP office. She denies chest pain, chest palpitations. Good appetite, sleeps flat in bed. Denies lower extremity edema.    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 s/p PVI [name] 2010;  b. on tikosyn/coumadin;  c. 05/2009 Echo: EF 60-65%, Gr 2 DD. (first dx 09/ 2007)   Bilateral lower extremity edema    Bleeding hemorrhoid    Carotid stenosis    mild (hosp 3/11)- consult by vasc/ Dr Arbie Cookey   Complication of anesthesia    hard to wake   Diverticulosis of colon    Dyspnea    on exertion-climbing stairs   Fatty liver    H/O cardiac radiofrequency ablation    01/ 2008 at [place] [facility] [place] /  03/ 2010  at [facility] failure with preserved ejection fraction (HCC)  History of adenomatous polyp of colon    tubular adenoma's   History of cardiomyopathy    secondary tachycardia-induced cardiomyopathy -- resolved 2014   History of squamous cell carcinoma in situ (SCCIS) of skin    05/ 2017  nasal bridge and right medial knee   History of transient ischemic attack (TIA)    01-24-2005 and 06-12-2009   Hyperlipidemia    Hypothyroidism    Mild intermittent asthma    reacts to cats   Mixed stress and urge urinary incontinence    Presence of permanent cardiac pacemaker    was put in 07/2019   Pulmonary nodule    S/P AV nodal ablation 07/28/19 07/29/2019    S/P mitral valve repair 10-23-1998  dr Elsie Ra at [facility]   for MVP and regurg. (annuloplasty ring procedure)   S/P placement of cardiac pacemaker MDT 07/28/19 07/29/2019    Past Surgical History:  Procedure Laterality Date   APPENDECTOMY  1978   AV NODE ABLATION N/A 07/28/2019   Procedure: AV NODE ABLATION;  Surgeon: [name] Salvia, MD;  Location: [facility] INVASIVE CV LAB;  Service: Cardiovascular;  Laterality: N/A;   BUBBLE STUDY  06/19/2019   Procedure: BUBBLE STUDY;  Surgeon: Chrystie Nose, MD;  Location: [facility] ENDOSCOPY;  Service: Cardiovascular;;   CARDIAC ELECTROPHYSIOLOGY MAPPING AND ABLATION  01/ 2008    at [place] [facility] [place]   right-sided ablation atrial flutter   CARDIAC ELECTROPHYSIOLOGY STUDY AND ABLATION  03/ 2010   dr Barrett Shell at [facility]   AV node ablation and pulmonary vein isolation for atrial fib   CARDIOVERSION  06-18-2006;  07-13-2006;  10-19-2010;  10-27-2010   CATARACT EXTRACTION W/PHACO Right 04/09/2021   Procedure: CATARACT EXTRACTION PHACO AND INTRAOCULAR LENS PLACEMENT (IOC) RIGHT 6.71 01:11.1;  Surgeon: Lockie Mola, MD;  Location: [facility] SURGERY CNTR;  Service: Ophthalmology;  Laterality: Right;   CATARACT EXTRACTION W/PHACO Left 04/23/2021   Procedure: CATARACT EXTRACTION PHACO AND INTRAOCULAR LENS PLACEMENT (IOC) LEFT;  Surgeon: Lockie Mola, MD;  Location: [facility] SURGERY CNTR;  Service: Ophthalmology;  Laterality: Left;  [place] 5.65 00:50.1   COLONOSCOPY     COLONOSCOPY WITH PROPOFOL N/A 10/13/2017   Procedure: COLONOSCOPY WITH PROPOFOL;  Surgeon: Wyline Mood, MD;  Location: [facility] ENDOSCOPY;  Service: Gastroenterology;  Laterality: N/A;   COLONOSCOPY WITH PROPOFOL N/A 02/20/2020   Procedure: COLONOSCOPY WITH PROPOFOL;  Surgeon: Regis Bill, MD;  Location: [facility] ENDOSCOPY;  Service: Endoscopy;  Laterality: N/A;   CYSTO/ TRANSURETHRAL COLLAGEN INJECTION THERAPY  07-26-2007   dr Sherron Monday   DILATION AND CURETTAGE OF UTERUS      ESOPHAGOGASTRODUODENOSCOPY (EGD) WITH PROPOFOL N/A 10/13/2017   Procedure: ESOPHAGOGASTRODUODENOSCOPY (EGD) WITH PROPOFOL;  Surgeon: Wyline Mood, MD;  Location: [facility] ENDOSCOPY;  Service: Gastroenterology;  Laterality: N/A;   EVALUATION UNDER ANESTHESIA WITH HEMORRHOIDECTOMY N/A 04/09/2020   Procedure: EXAM UNDER ANESTHESIA WITH HEMORRHOIDECTOMY;  Surgeon: Leafy Ro, MD;  Location: [facility] ORS;  Service: General;  Laterality: N/A;   EXCISIONAL HEMORRHOIDECTOMY  1980s   GIVENS CAPSULE STUDY N/A 12/08/2017   Procedure: GIVENS CAPSULE STUDY;  Surgeon: Wyline Mood, MD;  Location: [facility] ENDOSCOPY;  Service: Gastroenterology;  Laterality: N/A;   HEMORRHOID SURGERY N/A 10/29/2016   Procedure: HEMORRHOIDECTOMY;  Surgeon: Romie Levee, MD;  Location: [facility];  Service: General;  Laterality: N/A;   MITRAL VALVE ANNULOPLASTY  10/23/1998   "Model [id number]; Ronni Rumble [id number]"; size 32mm; [facility]; Dr. Elsie Ra   PACEMAKER IMPLANT N/A 07/28/2019   Procedure: PACEMAKER IMPLANT;  Surgeon:  [name] Salvia, MD;  Location: [facility] INVASIVE CV LAB;  Service: Cardiovascular;  Laterality: N/A;   PILONIDAL CYST EXCISION  1954   RIGHT/LEFT HEART CATH AND CORONARY ANGIOGRAPHY Bilateral 09/21/2022   Procedure: RIGHT/LEFT HEART CATH AND CORONARY ANGIOGRAPHY;  Surgeon: Iran Ouch, MD;  Location: [facility] INVASIVE CV LAB;  Service: Cardiovascular;  Laterality: Bilateral;   TEE WITH CARDIOVERSION  05-06-2006 at [facility];  01-02-2013 at [facility]   TEE WITHOUT CARDIOVERSION N/A 06/19/2019   Procedure: TRANSESOPHAGEAL ECHOCARDIOGRAM (TEE);  Surgeon: Chrystie Nose, MD;  Location: [facility] ENDOSCOPY;  Service: Cardiovascular;  Laterality: N/A;   TOTAL HIP ARTHROPLASTY Left 05/04/2017   Procedure: LEFT TOTAL HIP ARTHROPLASTY ANTERIOR APPROACH;  Surgeon: Kathryne Hitch, MD;  Location: [facility] OR;  Service: Orthopedics;  Laterality: Left;   TRANSTHORACIC ECHOCARDIOGRAM  05-01-2015   dr Graciela Husbands   ef 50-55%/  mild AV sclerosis without  stenosis/  post MV repair with mild central MR (valve area by pressure half-time 2cm^2,  valve area by continutity equation 0.91cm^2, peak grandiant 52mmHg)/  severe LAE/ mild TR/ mild RAE    TUBAL LIGATION Bilateral 1978    Current Outpatient Medications  Medication Instructions   Acetaminophen (TYLENOL PO) 1 tablet, Oral, As needed   albuterol (VENTOLIN HFA) 108 (90 Base) MCG/ACT inhaler 1-2 puffs, Inhalation, Every 6 hours PRN   bisoprolol (ZEBETA) 2.5 mg, Oral, Daily   empagliflozin (JARDIANCE) 10 mg, Oral, Daily before breakfast   ferrous sulfate 325 (65 FE) MG EC tablet TAKE 1 TABLET EVERY DAY WITH BREAKFAST   fluticasone (FLONASE) 50 MCG/ACT nasal spray 1 spray, Each Nare, 2 times daily PRN   furosemide (LASIX) 40 mg, Oral, Daily   levothyroxine (SYNTHROID) 75 MCG tablet TAKE 1 TABLET EVERY DAY BEFORE BREAKFAST   losartan (COZAAR) 25 mg, Oral, Daily at bedtime   mexiletine (MEXITIL) 200 mg, Oral, 2 times daily   Polyethyl Glycol-Propyl Glycol 0.4-0.3 % SOLN 1-2 drops, Both Eyes, 3 times daily PRN   triamcinolone cream (KENALOG) 0.5 % 1 Application, Topical, 2 times daily PRN   VITAMIN D PO Oral, Daily   warfarin (COUMADIN) 5 MG tablet TAKE 1 TABLET DAILY OR AS DIRECTED BY COUMADIN CLINIC    Social History:  The patient  reports that she has never smoked. She has never used smokeless tobacco. She reports that she does not currently use alcohol. She reports that she does not use drugs.   Family History:  The patient's family history includes Alcohol abuse in her father; Cancer in her father; Lung cancer in her father; Sudden death in an other family member.  ROS:  Please see the history of present illness. All other systems are reviewed and otherwise negative.   PHYSICAL EXAM:  VS:  BP 124/78 (BP Location: Left Arm, Patient Position: Sitting, Cuff Size: Normal)   Pulse 77   Ht 5\' 5"  (1.651 m)   Wt 173 lb 8 oz (78.7 kg)   SpO2 96%   BMI 28.87 kg/m  BMI: Body mass index is 28.87  kg/m.  GEN- The patient is well appearing, alert and oriented x 3 today.   Lungs- Clear to ausculation bilaterally, normal work of breathing.  Heart- Irregularly irregular rate and rhythm, no murmurs, rubs or gallops Extremities- Trace peripheral edema, warm, dry Skin-   device pocket well-healed, no tethering   Device interrogation done today and reviewed by myself:  Battery 7.9 years Lead thresholds, impedence, sensing stable  VP 94% No episodes HR histograms remain blunted Adjusted activity  accelerometer from 30s to 15 seconds  EKG is ordered. Personal review of EKG from today shows: EKG Interpretation Date/Time:  Tuesday November 17 2022 09:31:02 EDT Ventricular Rate:  77 PR Interval:    QRS Duration:  188 QT Interval:  444 QTC Calculation: 502 R Axis:   -75  Text Interpretation: Ventricular-paced rhythm with occasional Premature ventricular complexes Confirmed by Sherie Don [phone]) on 11/17/2022 10:36:52 AM    Recent Labs: 03/02/2022: B Natriuretic Peptide 197.7 07/20/2022: ALT 28; TSH 3.161 09/16/2022: BUN 28; Creatinine, Ser 0.82 09/21/2022: Potassium 3.6; Sodium 144 11/03/2022: Hemoglobin 13.1; Platelets 159  03/12/2022: Cholesterol 194; HDL 34.80; LDL Cholesterol 134; Total CHOL/HDL Ratio 6; Triglycerides 125.0; VLDL 25.0   CrCl cannot be calculated (Patient's most recent lab result is older than the maximum 21 days allowed.).   Wt Readings from Last 3 Encounters:  11/17/22 173 lb 8 oz (78.7 kg)  11/03/22 172 lb 6.4 oz (78.2 kg)  10/15/22 173 lb (78.5 kg)     Additional studies reviewed include: Previous EP, cardiology notes.   R/LHC, 09/21/2022   3rd RPL lesion is 100% stenosed.   There is moderate to severe left ventricular systolic dysfunction.   LV end diastolic pressure is normal.   The left ventricular ejection fraction is 25-35% by visual estimate.  1.  No significant coronary artery disease with the exception of an occluded small RPL branch with  collaterals from the left circumflex. 2.  Moderately to severely reduced LV systolic function. 3.  Right heart catheterization showed high normal filling pressures, normal pulmonary pressure and normal cardiac output.  RA: 4 mmHg PW: 11/22 with a mean of 13 mmHg.  Prominent V wave suggestive of at least moderate mitral regurgitation. PA: 31/13 with a mean of 20 mmHg Cardiac output: 4.72 with an index of 2.55.  TTE, 08/18/2022  1. Left ventricular ejection fraction, by estimation, is 25 to 30%. The left ventricle has severely decreased function. The left ventricle demonstrates global hypokinesis. Left ventricular diastolic parameters are indeterminate.   2. Right ventricular systolic function is low normal. The right ventricular size is normal. There is moderately elevated pulmonary artery systolic pressure.   3. Left atrial size was severely dilated.   4. Right atrial size was moderately dilated.   5. The mitral valve has been repaired/replaced. Mild mitral valve regurgitation.   6. Tricuspid valve regurgitation is mild to moderate.   7. The aortic valve is tricuspid. Aortic valve regurgitation is not visualized. Aortic valve sclerosis/calcification is present, without any evidence of aortic stenosis.   8. The inferior vena cava is dilated in size with <50% respiratory variability, suggesting right atrial pressure of 15 mmHg.   Comparison(s): 06/10/19: 55-60%, MV annuloplasty ring (10/1998), RVSP 37, LAE, mild MS, m/m MR, mass on PMVL.   ASSESSMENT AND PLAN:  #) PVC Burden appears improved given that her VP has increased to 94% (previously 88-90%)  She misunderstood when to start mexilitine, have recommended she call office when she returns home to review mediation list with bottles Continue bisoprolol, if taking Start mexilitine, if not taking  #) perm AFib #) PPM in situ #) s/p AVN ablation Pacemaker functioning well, see paceart for details Slight adjustment to accelerometer as  above  #) Hypercoag d/t perm AFib CHA2DS2-VASc Score = 6 [CHF History: 1, HTN History: 0, Diabetes History: 0, Stroke History: 2, Vascular Disease History: 0, Age Score: 2, Gender Score: 1].  Therefore, the patient's annual risk of stroke is 9.7 %. OAC -  warfarin, mgmt at PCP office No bleeding concerns :1}    #) HFrEF Recent echo shows reduced EF compared to prior NYHA III symptoms Warm and dry on exam Symptomatic improvement since starting jardiance, will provide office samples and provide info for medication assistance in hopes that she can continue GDMT: bisoprolol, losartan, jardiance - consider spiro at follow-up Diuretic: 40mg  lasix daily   Extensive discussion regarding medication compliance and the desire to get on full GDMT at the same time, and not replace one medication with the other. Patient verbalized understanding.        Current medicines are reviewed at length with the patient today.   The patient has concerns regarding her medicines.  The following changes were made today:   None  Labs/ tests ordered today include:  Orders Placed This Encounter  Procedures   EKG 12-Lead     Disposition: Follow up with EP APP in 2 weeks    Signed, Sherie Don, NP  11/17/22  10:37 AM  Electrophysiology [facility]

## 2022-11-17 NOTE — Telephone Encounter (Signed)
Jardiance 10 mg tablet. 1 box  [id number] Exp:03/2024

## 2022-11-19 ENCOUNTER — Ambulatory Visit: Payer: Medicare Other

## 2022-11-24 ENCOUNTER — Encounter: Payer: Medicare Other | Admitting: Cardiology

## 2022-11-24 ENCOUNTER — Telehealth: Payer: Self-pay | Admitting: *Deleted

## 2022-11-24 NOTE — Telephone Encounter (Signed)
Called patient to review patient assistance application. She states that she would not qualify and does not want to pursue it at this time. Patient states that Dr. Graciela Husbands told her it was not necessary but states she is very appreciative for the information but wishes to decline at this time. Encouraged her to give [name] a call back if she should have any further questions.

## 2022-11-25 ENCOUNTER — Other Ambulatory Visit (HOSPITAL_COMMUNITY): Payer: Self-pay

## 2022-11-25 ENCOUNTER — Telehealth (HOSPITAL_COMMUNITY): Payer: Self-pay

## 2022-11-25 ENCOUNTER — Ambulatory Visit: Payer: Medicare Other | Attending: Cardiology | Admitting: Cardiology

## 2022-11-25 ENCOUNTER — Encounter: Payer: Self-pay | Admitting: Oncology

## 2022-11-25 ENCOUNTER — Encounter: Payer: Self-pay | Admitting: Cardiology

## 2022-11-25 VITALS — BP 126/63 | HR 73 | Resp 14 | Wt 175.0 lb

## 2022-11-25 DIAGNOSIS — I493 Ventricular premature depolarization: Secondary | ICD-10-CM | POA: Diagnosis not present

## 2022-11-25 DIAGNOSIS — I4821 Permanent atrial fibrillation: Secondary | ICD-10-CM

## 2022-11-25 DIAGNOSIS — I5020 Unspecified systolic (congestive) heart failure: Secondary | ICD-10-CM | POA: Diagnosis not present

## 2022-11-25 NOTE — Patient Instructions (Signed)
Medication Changes:  Continue taking Jardiance.    Special Instructions // Education:  Do the following things EVERYDAY: Weigh yourself in the morning before breakfast. Write it down and keep it in a log. Take your medicines as prescribed Eat low salt foods--Limit salt (sodium) to 2000 mg per day.  Stay as active as you can everyday Limit all fluids for the day to less than 2 liters   Follow-Up in: in 2 months with Dr. Gasper Lloyd. Please call in October to schedule your November appointment.     If you have any questions or concerns before your next appointment please send [name] a message through [place] or call our office at [phone] Monday-Friday 8 am-5 pm.   If you have an urgent need after hours on the weekend please call your Primary Cardiologist or the Advanced Heart Failure Clinic in [place] at [phone].

## 2022-11-25 NOTE — Progress Notes (Signed)
ADVANCED HEART FAILURE FOLLOW UP CLINIC NOTE  Referring Physician: Tower, Audrie Gallus, MD  Primary Care: Tower, Audrie Gallus, MD Primary Cardiologist:  HPI: [name] is a 89 y.o. female with permanent atrial fibrillation,AFL s/p ablation ([name]) heart failure with recovered ejection fraction, mitral regurgitation status post MVR, history of AV node ablation with permanent pacemaker implant, history of stroke presenting today for follow up.    Her cardiac history dates back to at least 2001 when she underwent mitral valve repair with annuloplasty ring at [facility]. She did well for several years until she was diagnosed with atrial fibrillation resistent to anti-arrythmics.  Her atrial fibrillation was believed to be secondary to atriotomy from AFL ablation.  She eventually underwent AV nodal ablation with placement of single-chamber permanent pacemaker.  Once more she did fairly well with improvement in functional status until the past several weeks to months when she has had a steady decline.  She had an echocardiogram in May 2024 due to chest pain and shortness of breath.  Echo was significant for drop in EF to 25 to 30%.  Follow-up heart catheterization with CTO of the small PLV branch vessel but otherwise no significant CAD.         SUBJECTIVE:  Unfortuately she was unable to start the jardiance as the cost was prohibitive. She was supposed to start on mexilitine for suppression of her PVCs and has been taking it since her appointment on 8/27. She reports that overall her symptoms are stable since her last visit. Still gets occasional dizziness around mechanical devices, but no orthostasis, syncope, chest pain, worsening SOA, worsening LE edema.  PMH, current medications, allergies, social history, and family history reviewed in epic.  PHYSICAL EXAM: Vitals:   11/25/22 1055  BP: 126/63  Pulse: 73  Resp: 14  SpO2: 97%   GENERAL: Well nourished and in no apparent distress at  rest.  HEENT: Negative for xanthelasma. There is no scleral icterus.  The mucous membranes are pink and moist.   CHEST: There are no chest wall deformities. There is no chest wall tenderness. Respirations are unlabored.  Lungs- Clear to auscultation bilaterally, normal work of breathing. CARDIAC:  JVP: not visible         Normal rate with regular rhythm. No murmurs, rubs or gallops. ABDOMEN: Soft, non-tender, non-distended. [name]  EXTREMITIES: Warm and well perfused with no cyanosis, clubbing.  NEUROLOGIC: Patient is oriented x3 with no focal or lateralizing neurologic deficits.  PSYCH: Patients affect is appropriate, there is no evidence of anxiety or depression.  SKIN: Warm and dry; no lesions or wounds.     DATA REVIEW   ECG: 10/08/22: atrial fibrillation with RV pacing    ECHO: 08/18/22: LVEF 35% with significant dyssynchrony due to RV pacing    CATH: 09/21/22:  3rd RPL lesion is 100% stenosed.   There is moderate to severe left ventricular systolic dysfunction.   LV end diastolic pressure is normal.   The left ventricular ejection fraction is 25-35% by visual estimate.   1.  No significant coronary artery disease with the exception of an occluded small RPL branch with collaterals from the left circumflex. 2.  Moderately to severely reduced LV systolic function. 3.  Right heart catheterization showed high normal filling pressures, normal pulmonary pressure and normal cardiac output.   RA: 4 mmHg PW: 11/22 with a mean of 13 mmHg.  Prominent V wave suggestive of at least moderate mitral regurgitation. PA: 31/13 with a mean of  20 mmHg Cardiac output: 4.72 with an index of 2.55.      Etiology of AV:[id number] due to RV pacing burden, PVCs.  NYHA class / AHA Stage:IIB Volume status & Diuretics: Euvolemic, 40mg  lasix daily    ASSESSMENT & PLAN:  1. Heart failure with reduced EF: Follows with EP, recently started on mexilitine for PVC suppression, reported some improvement on her  recent RV pacing burden. Plan to trial medical therapy prior to CRT-D upgrade. Symptoms stable, discussed with pharmacy and able to get jardiance covered for her going forward. Given stability in symptoms and her desire to not increase pill burden will hold on MRA at this time, could consider in the future. - Start jardiance 10mg  daily - Continue home losartan, transition to entresto at next appointment and may be able to stop lasix - Continue lasix 40mg  daily - Continue bisoprolol 2.5mg  daily - Hold on MRA at this time - F/u with Dr. Gasper Lloyd in 2 months  2.  Permanent atrial fibrillation -AV node ablation in May 2021 with permanent pacemaker placement -History of stroke due to subtherapeutic INR. -On mexletine for PVC burden, RV pacing 97% so now improved   3. MR s/p mitral annuloplasty - Details not available; TEE from 2021 with prosthetic annuloplasty ring from 10/23/1998.   Clearnce Hasten, MD Advanced Heart Failure Mechanical Circulatory Support 11/25/22

## 2022-11-25 NOTE — Telephone Encounter (Signed)
Advanced Heart Failure Patient Advocate Encounter  The patient was approved for a [employer] grant that will help cover the cost of Bisoprolol, Jardiance, Losartan, Entresto.  Total amount awarded, $10,000.  Effective: 10/26/2022 - 10/25/2023.  BIN [id number] PCN [id number] [id number] ID [id number]  Pharmacy provided with approval and processing information. Patient informed in office.  Burnell Blanks, CPhT Rx Patient Advocate Phone: [phone]

## 2022-11-30 NOTE — Progress Notes (Unsigned)
Cardiology Office Note Date:  11/30/2022  Patient ID:  [name], Key [birth year], MRN [id number] PCP:  [name], MD  Cardiologist:  None Electrophysiologist: Sherryl Manges, MD  Chief Complaint: fatigue  History of Present Illness: [name] is a 89 y.o. female with PMH notable for perm AFib, HFrEF, NICM, VHD s/p MVr, s/p AVN ablation and PPM implant, CVA, PVCs, hypothyroid; seen today for Dr. Graciela Husbands for routine EP follow-up.   She was seen by me 06/2022 for acute visit d/t "chest twinges" snaking across her L chest and increased fatigue. She was found to be in bigeminal PVCs in office. BB was started at that visit, and an updated echo revealed newly reduced LVEF. LHC showed no significant CAD. She was seen by Dr. Gasper Lloyd with Adv HF in 09/2022 to establish care, adjusted GDMT.  She saw Dr. Graciela Husbands 10/2022, she continued to have freq PVCs and given amiodarone allergy/intolerance, he started mexiletine to try to reduce PVC burden. Considering CRT upgrade I saw her in follow-up, and she had not started mexiletine - misunderstood directions. She had started jardiance and noticed an improvement in her SOB, but medication cost prohibitive.  She saw HF in follow-up 2 weeks ago, able to rec grant for HF drugs to be able to continue jardiance. Planning to add spiro at follow-up  On follow-up today, *** AF burden, symptoms *** palpitations *** bleeding concerns  -- needs labwork  Warfarin managed at PCP office   She denies chest pain, palpitations, dyspnea, PND, orthopnea, nausea, vomiting, dizziness, syncope, edema, weight gain, or early satiety.      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 s/p PVI [name] 2010;  b. on tikosyn/coumadin;  c. 05/2009 Echo: EF 60-65%, Gr 2 DD. (first dx 09/ 2007)   Bilateral lower extremity edema    Bleeding hemorrhoid    Carotid stenosis    mild (hosp 3/11)- consult by vasc/ Dr Arbie Cookey   Complication of anesthesia    hard to wake   Diverticulosis of colon    Dyspnea    on exertion-climbing stairs   Fatty liver    H/O cardiac radiofrequency ablation    01/ 2008 at [facility] [place] /  03/ 2010  at [facility] failure with preserved ejection fraction [facility])    History of adenomatous polyp of colon    tubular adenoma's   History of cardiomyopathy    secondary tachycardia-induced cardiomyopathy -- resolved 2014   History of squamous cell carcinoma in situ (SCCIS) of skin    05/ 2017  nasal bridge and right medial knee   History of transient ischemic attack (TIA)    01-24-2005 and 06-12-2009   Hyperlipidemia    Hypothyroidism    Mild intermittent asthma    reacts to cats   Mixed stress and urge urinary incontinence    Presence of permanent cardiac pacemaker    was put in 07/2019   Pulmonary nodule    S/P AV nodal  ablation 07/28/19 07/29/2019   S/P mitral valve repair 10-23-1998  dr Elsie Ra at [facility]   for MVP and regurg. (annuloplasty ring procedure)   S/P placement of cardiac pacemaker MDT 07/28/19 07/29/2019    Past Surgical History:  Procedure Laterality Date   APPENDECTOMY  1978   AV NODE ABLATION N/A 07/28/2019   Procedure: AV NODE ABLATION;  Surgeon: [name] Salvia, MD;  Location: [facility] INVASIVE CV LAB;  Service: Cardiovascular;  Laterality: N/A;   BUBBLE STUDY  06/19/2019   Procedure: BUBBLE STUDY;  Surgeon: Chrystie Nose, MD;  Location: [facility] ENDOSCOPY;  Service: Cardiovascular;;   CARDIAC ELECTROPHYSIOLOGY MAPPING AND ABLATION  01/ 2008    at [facility] [place]    right-sided ablation atrial flutter   CARDIAC ELECTROPHYSIOLOGY STUDY AND ABLATION  03/ 2010   dr Barrett Shell at [facility]   AV node ablation and pulmonary vein isolation for atrial fib   CARDIOVERSION  06-18-2006;  07-13-2006;  10-19-2010;  10-27-2010   CATARACT EXTRACTION W/PHACO Right 04/09/2021   Procedure: CATARACT EXTRACTION PHACO AND INTRAOCULAR LENS PLACEMENT (IOC) RIGHT 6.71 01:11.1;  Surgeon: Lockie Mola, MD;  Location: [facility] SURGERY CNTR;  Service: Ophthalmology;  Laterality: Right;   CATARACT EXTRACTION W/PHACO Left 04/23/2021   Procedure: CATARACT EXTRACTION PHACO AND INTRAOCULAR LENS PLACEMENT (IOC) LEFT;  Surgeon: Lockie Mola, MD;  Location: [facility] SURGERY CNTR;  Service: Ophthalmology;  Laterality: Left;  [place] 5.65 00:50.1   COLONOSCOPY     COLONOSCOPY WITH PROPOFOL N/A 10/13/2017   Procedure: COLONOSCOPY WITH PROPOFOL;  Surgeon: Wyline Mood, MD;  Location: [facility] ENDOSCOPY;  Service: Gastroenterology;  Laterality: N/A;   COLONOSCOPY WITH PROPOFOL N/A 02/20/2020   Procedure: COLONOSCOPY WITH PROPOFOL;  Surgeon: Regis Bill, MD;  Location: [facility] ENDOSCOPY;  Service: Endoscopy;  Laterality: N/A;   CYSTO/ TRANSURETHRAL COLLAGEN INJECTION THERAPY  07-26-2007   dr Sherron Monday   DILATION AND CURETTAGE OF UTERUS     ESOPHAGOGASTRODUODENOSCOPY (EGD) WITH PROPOFOL N/A 10/13/2017   Procedure: ESOPHAGOGASTRODUODENOSCOPY (EGD) WITH PROPOFOL;  Surgeon: Wyline Mood, MD;  Location: [facility] ENDOSCOPY;  Service: Gastroenterology;  Laterality: N/A;   EVALUATION UNDER ANESTHESIA WITH HEMORRHOIDECTOMY N/A 04/09/2020   Procedure: EXAM UNDER ANESTHESIA WITH HEMORRHOIDECTOMY;  Surgeon: Leafy Ro, MD;  Location: [facility] ORS;  Service: General;  Laterality: N/A;   EXCISIONAL HEMORRHOIDECTOMY  1980s   GIVENS CAPSULE STUDY N/A 12/08/2017   Procedure: GIVENS CAPSULE STUDY;  Surgeon: Wyline Mood, MD;  Location: [facility] ENDOSCOPY;  Service: Gastroenterology;  Laterality: N/A;   HEMORRHOID SURGERY N/A  10/29/2016   Procedure: HEMORRHOIDECTOMY;  Surgeon: Romie Levee, MD;  Location: [facility];  Service: General;  Laterality: N/A;   MITRAL VALVE ANNULOPLASTY  10/23/1998   "Model [id number]; Ronni Rumble [id number]"; size 32mm; [facility]; Dr. Elsie Ra   PACEMAKER IMPLANT N/A 07/28/2019   Procedure: PACEMAKER IMPLANT;  Surgeon: [name] Salvia, MD;  Location: [facility] INVASIVE CV LAB;  Service: Cardiovascular;  Laterality: N/A;   PILONIDAL CYST EXCISION  1954   RIGHT/LEFT HEART CATH AND CORONARY ANGIOGRAPHY Bilateral 09/21/2022   Procedure: RIGHT/LEFT HEART CATH AND CORONARY ANGIOGRAPHY;  Surgeon: Iran Ouch, MD;  Location: [facility] INVASIVE CV LAB;  Service: Cardiovascular;  Laterality: Bilateral;   TEE WITH CARDIOVERSION  05-06-2006 at [facility];  01-02-2013 at [facility]   TEE WITHOUT CARDIOVERSION N/A 06/19/2019   Procedure: TRANSESOPHAGEAL ECHOCARDIOGRAM (TEE);  Surgeon: Chrystie Nose, MD;  Location: [facility] ENDOSCOPY;  Service: Cardiovascular;  Laterality: N/A;   TOTAL HIP ARTHROPLASTY Left 05/04/2017   Procedure: LEFT TOTAL HIP  ARTHROPLASTY ANTERIOR APPROACH;  Surgeon: Kathryne Hitch, MD;  Location: [facility] OR;  Service: Orthopedics;  Laterality: Left;   TRANSTHORACIC ECHOCARDIOGRAM  05-01-2015   dr Graciela Husbands   ef 50-55%/  mild AV sclerosis without stenosis/  post MV repair with mild central MR (valve area by pressure half-time 2cm^2,  valve area by continutity equation 0.91cm^2, peak grandiant 66mmHg)/  severe LAE/ mild TR/ mild RAE    TUBAL LIGATION Bilateral 1978    Current Outpatient Medications  Medication Instructions   Acetaminophen (TYLENOL PO) 1 tablet, Oral, As needed   albuterol (VENTOLIN HFA) 108 (90 Base) MCG/ACT inhaler 1-2 puffs, Inhalation, Every 6 hours PRN   bisoprolol (ZEBETA) 2.5 mg, Oral, Daily   empagliflozin (JARDIANCE) 10 mg, Oral, Daily before breakfast   ferrous sulfate 325 (65 FE) MG EC tablet TAKE 1 TABLET EVERY DAY WITH BREAKFAST   fluticasone (FLONASE) 50 MCG/ACT nasal  spray 1 spray, Each Nare, 2 times daily PRN   furosemide (LASIX) 40 mg, Oral, Daily   levothyroxine (SYNTHROID) 75 MCG tablet TAKE 1 TABLET EVERY DAY BEFORE BREAKFAST   losartan (COZAAR) 25 mg, Oral, Daily at bedtime   mexiletine (MEXITIL) 200 mg, Oral, 2 times daily   triamcinolone cream (KENALOG) 0.5 % 1 Application, Topical, 2 times daily PRN   VITAMIN D PO Oral, Daily   warfarin (COUMADIN) 5 MG tablet TAKE 1 TABLET DAILY OR AS DIRECTED BY COUMADIN CLINIC    Social History:  The patient  reports that she has never smoked. She has never used smokeless tobacco. She reports that she does not currently use alcohol. She reports that she does not use drugs.   Family History:  The patient's family history includes Alcohol abuse in her father; Cancer in her father; Lung cancer in her father; Sudden death in an other family member.  ROS:  Please see the history of present illness. All other systems are reviewed and otherwise negative.   PHYSICAL EXAM:  VS:  There were no vitals taken for this visit. BMI: There is no height or weight on file to calculate BMI.  GEN- The patient is well appearing, alert and oriented x 3 today.   Lungs- Clear to ausculation bilaterally, normal work of breathing.  Heart- Irregularly irregular rate and rhythm, no murmurs, rubs or gallops Extremities- Trace peripheral edema, warm, dry Skin-   device pocket well-healed, no tethering   Device interrogation done today and reviewed by myself:  Battery 7.9 years Lead thresholds, impedence, sensing stable  VP 94% No episodes HR histograms remain blunted Adjusted activity accelerometer from 30s to 15 seconds  EKG is ordered. Personal review of EKG from today shows:      Recent Labs: 03/02/2022: B Natriuretic Peptide 197.7 07/20/2022: ALT 28; TSH 3.161 09/16/2022: BUN 28; Creatinine, Ser 0.82 09/21/2022: Potassium 3.6; Sodium 144 11/03/2022: Hemoglobin 13.1; Platelets 159  03/12/2022: Cholesterol 194; HDL 34.80;  LDL Cholesterol 134; Total CHOL/HDL Ratio 6; Triglycerides 125.0; VLDL 25.0   CrCl cannot be calculated (Patient's most recent lab result is older than the maximum 21 days allowed.).   Wt Readings from Last 3 Encounters:  11/25/22 175 lb (79.4 kg)  11/17/22 173 lb 8 oz (78.7 kg)  11/03/22 172 lb 6.4 oz (78.2 kg)     Additional studies reviewed include: Previous EP, cardiology notes.   R/LHC, 09/21/2022   3rd RPL lesion is 100% stenosed.   There is moderate to severe left ventricular systolic dysfunction.   LV end diastolic pressure is normal.  The left ventricular ejection fraction is 25-35% by visual estimate.  1.  No significant coronary artery disease with the exception of an occluded small RPL branch with collaterals from the left circumflex. 2.  Moderately to severely reduced LV systolic function. 3.  Right heart catheterization showed high normal filling pressures, normal pulmonary pressure and normal cardiac output.  RA: 4 mmHg PW: 11/22 with a mean of 13 mmHg.  Prominent V wave suggestive of at least moderate mitral regurgitation. PA: 31/13 with a mean of 20 mmHg Cardiac output: 4.72 with an index of 2.55.  TTE, 08/18/2022  1. Left ventricular ejection fraction, by estimation, is 25 to 30%. The left ventricle has severely decreased function. The left ventricle demonstrates global hypokinesis. Left ventricular diastolic parameters are indeterminate.   2. Right ventricular systolic function is low normal. The right ventricular size is normal. There is moderately elevated pulmonary artery systolic pressure.   3. Left atrial size was severely dilated.   4. Right atrial size was moderately dilated.   5. The mitral valve has been repaired/replaced. Mild mitral valve regurgitation.   6. Tricuspid valve regurgitation is mild to moderate.   7. The aortic valve is tricuspid. Aortic valve regurgitation is not visualized. Aortic valve sclerosis/calcification is present, without any  evidence of aortic stenosis.   8. The inferior vena cava is dilated in size with <50% respiratory variability, suggesting right atrial pressure of 15 mmHg.   Comparison(s): 06/10/19: 55-60%, MV annuloplasty ring (10/1998), RVSP 37, LAE, mild MS, m/m MR, mass on PMVL.   ASSESSMENT AND PLAN:  #) PVC Burden appears improved given that her VP has increased to 94% (previously 88-90%)  She misunderstood when to start mexilitine, have recommended she call office when she returns home to review mediation list with bottles Continue bisoprolol, if taking Start mexilitine, if not taking  #) perm AFib #) PPM in situ #) s/p AVN ablation Pacemaker functioning well, see paceart for details Slight adjustment to accelerometer as above  #) Hypercoag d/t perm AFib CHA2DS2-VASc Score = 6 [CHF History: 1, HTN History: 0, Diabetes History: 0, Stroke History: 2, Vascular Disease History: 0, Age Score: 2, Gender Score: 1].  Therefore, the patient's annual risk of stroke is 9.7 %. OAC - warfarin, mgmt at PCP office No bleeding concerns :1}    #) HFrEF Recent echo shows reduced EF compared to prior NYHA III symptoms Warm and dry on exam Symptomatic improvement since starting jardiance, will provide office samples and provide info for medication assistance in hopes that she can continue GDMT: bisoprolol, losartan, jardiance - consider spiro at follow-up Diuretic: 40mg  lasix daily      Current medicines are reviewed at length with the patient today.   The patient has concerns regarding her medicines.  The following changes were made today:   None  Labs/ tests ordered today include:  No orders of the defined types were placed in this encounter.    Disposition: Follow up with EP APP in 2 weeks    Signed, Sherie Don, NP  11/30/22  2:43 PM  Electrophysiology [facility]

## 2022-12-01 ENCOUNTER — Encounter: Payer: Self-pay | Admitting: Cardiology

## 2022-12-01 ENCOUNTER — Encounter: Payer: Medicare Other | Admitting: Cardiology

## 2022-12-01 ENCOUNTER — Ambulatory Visit: Payer: Medicare Other | Attending: Cardiology | Admitting: Cardiology

## 2022-12-01 VITALS — BP 122/72 | HR 73 | Ht 65.0 in | Wt 170.0 lb

## 2022-12-01 DIAGNOSIS — Z9889 Other specified postprocedural states: Secondary | ICD-10-CM | POA: Insufficient documentation

## 2022-12-01 DIAGNOSIS — Z950 Presence of cardiac pacemaker: Secondary | ICD-10-CM | POA: Insufficient documentation

## 2022-12-01 DIAGNOSIS — I5020 Unspecified systolic (congestive) heart failure: Secondary | ICD-10-CM | POA: Diagnosis not present

## 2022-12-01 DIAGNOSIS — I493 Ventricular premature depolarization: Secondary | ICD-10-CM | POA: Insufficient documentation

## 2022-12-01 DIAGNOSIS — I4821 Permanent atrial fibrillation: Secondary | ICD-10-CM | POA: Diagnosis not present

## 2022-12-01 LAB — CUP PACEART INCLINIC DEVICE CHECK
Date Time Interrogation Session: 20240910143649
Implantable Lead Connection Status: 753985
Implantable Lead Implant Date: 20210507
Implantable Lead Location: 753860
Implantable Lead Model: 5076
Implantable Pulse Generator Implant Date: 20210507

## 2022-12-01 NOTE — Patient Instructions (Signed)
Medication Instructions:   Your physician recommends that you continue on your current medications as directed. Please refer to the Current Medication list given to you today.  *If you need a refill on your cardiac medications before your next appointment, please call your pharmacy*   Lab Work:  Your physician recommends you have lab work today.   If you have labs (blood work) drawn today and your tests are completely normal, you will receive your results only by: MyChart Message (if you have MyChart) OR A paper copy in the mail If you have any lab test that is abnormal or we need to change your treatment, we will call you to review the results.   Testing/Procedures:  None Ordered   Follow-Up: At [facility], you and your health needs are our priority.  As part of our continuing mission to provide you with exceptional heart care, we have created designated Provider Care Teams.  These Care Teams include your primary Cardiologist (physician) and Advanced Practice Providers (APPs -  Physician Assistants and Nurse Practitioners) who all work together to provide you with the care you need, when you need it.  We recommend signing up for the patient portal called "MyChart".  Sign up information is provided on this After Visit Summary.  MyChart is used to connect with patients for Virtual Visits (Telemedicine).  Patients are able to view lab/test results, encounter notes, upcoming appointments, etc.  Non-urgent messages can be sent to your provider as well.   To learn more about what you can do with MyChart, go to [email or web address].    Your next appointment:    We will push Dr. Koren Bound appt out a month

## 2022-12-02 LAB — BASIC METABOLIC PANEL
BUN/Creatinine Ratio: 28 (ref 12–28)
BUN: 31 mg/dL — ABNORMAL HIGH (ref 8–27)
CO2: 27 mmol/L (ref 20–29)
Calcium: 9.3 mg/dL (ref 8.7–10.3)
Chloride: 100 mmol/L (ref 96–106)
Creatinine, Ser: 1.09 mg/dL — ABNORMAL HIGH (ref 0.57–1.00)
Glucose: 102 mg/dL — ABNORMAL HIGH (ref 70–99)
Potassium: 4.2 mmol/L (ref 3.5–5.2)
Sodium: 143 mmol/L (ref 134–144)
eGFR: 49 mL/min/{1.73_m2} — ABNORMAL LOW (ref >59)

## 2022-12-03 ENCOUNTER — Ambulatory Visit (INDEPENDENT_AMBULATORY_CARE_PROVIDER_SITE_OTHER): Payer: Medicare Other

## 2022-12-03 DIAGNOSIS — Z7901 Long term (current) use of anticoagulants: Secondary | ICD-10-CM

## 2022-12-03 LAB — POCT INR: INR: 2 (ref 2.0–3.0)

## 2022-12-03 MED ORDER — WARFARIN SODIUM 5 MG PO TABS: ORAL_TABLET | ORAL | 1 refills | Status: DC

## 2022-12-03 NOTE — Patient Instructions (Addendum)
Pre visit review using our clinic review tool, if applicable. No additional management support is needed unless otherwise documented below in the visit note.  Continue 1 tablet daily except take 1/2 tablet on Sundays and Wednesdays . Recheck in 4 weeks.

## 2022-12-03 NOTE — Progress Notes (Addendum)
Pt has been experiencing for the last 2 to 2 1/2 months what she describes as "swimmy headed, or head in the clouds". This nurse has noticed pt has had flat effect, she does not seem to care to go anywhere or do anything around the house, and seems frustrated with how she is feeling over the last couple of months. Pt c/o lethargy, some dizziness, no energy to do anything anymore. She reports she cannot even walk to her mailbox anymore. Pt is in coumadin clinic at least every 4 weeks and this nurse has observed these changes in the pt. Inquired if pt has started any new medications and if she checks her blood pressure. Pt reports she started two new medications, bisoprolol in July and mexiletine in Aug. Pt reports she does not regularly check her BP at home but reports when she goes to an OV they seem to be happy with her blood pressure. There appears to be some depression starting due to these symptoms and inability to do what she was doing just a few months ago. Advised pt to contact cardiology to report symptoms. She asked this nurse to send a msg to cardiology explaining what I have observed concerning her symptoms. Advised a msg would be sent to your cardiologist and that office should f/u with her. Advised if she does not hear from them today to contact their office directly. Pt verbalized understanding.   Sent staff msg to Dr. Graciela Husbands concerning symptoms.  Continue 1 tablet daily except take 1/2 tablet on Sundays and Wednesdays . Recheck in 3 weeks per pt request due to being out of town.   Pt is compliant with warfarin management and PCP apts.  Sent in refill of warfarin to requested pharmacy.

## 2022-12-10 ENCOUNTER — Emergency Department
Admission: EM | Admit: 2022-12-10 | Discharge: 2022-12-10 | Disposition: A | Discharge status: Home / Self Care | Payer: Medicare Other | Attending: Emergency Medicine | Admitting: Emergency Medicine

## 2022-12-10 ENCOUNTER — Other Ambulatory Visit: Payer: Self-pay

## 2022-12-10 ENCOUNTER — Encounter: Payer: Self-pay | Admitting: Emergency Medicine

## 2022-12-10 DIAGNOSIS — Z950 Presence of cardiac pacemaker: Secondary | ICD-10-CM | POA: Insufficient documentation

## 2022-12-10 DIAGNOSIS — Z7901 Long term (current) use of anticoagulants: Secondary | ICD-10-CM | POA: Diagnosis not present

## 2022-12-10 DIAGNOSIS — W57XXXA Bitten or stung by nonvenomous insect and other nonvenomous arthropods, initial encounter: Secondary | ICD-10-CM | POA: Insufficient documentation

## 2022-12-10 DIAGNOSIS — L299 Pruritus, unspecified: Secondary | ICD-10-CM | POA: Diagnosis not present

## 2022-12-10 DIAGNOSIS — S60562A Insect bite (nonvenomous) of left hand, initial encounter: Secondary | ICD-10-CM | POA: Insufficient documentation

## 2022-12-10 DIAGNOSIS — T63421A Toxic effect of venom of ants, accidental (unintentional), initial encounter: Secondary | ICD-10-CM

## 2022-12-10 MED ORDER — PREDNISONE 20 MG PO TABS: 20.0000 mg | ORAL_TABLET | Freq: Every day | ORAL | 0 refills | Status: AC

## 2022-12-10 MED ORDER — HYDROCORTISONE 1 % EX OINT
1.0000 | TOPICAL_OINTMENT | Freq: Two times a day (BID) | CUTANEOUS | 0 refills | Status: AC
Start: 2022-12-10 — End: 2022-12-19

## 2022-12-10 MED ORDER — PREDNISONE 20 MG PO TABS
20.0000 mg | ORAL_TABLET | Freq: Once | ORAL | Status: AC
  Administered 2022-12-10: 20 mg via ORAL
  Filled 2022-12-10: qty 1

## 2022-12-10 MED ORDER — CETIRIZINE HCL 5 MG/5ML PO SOLN
10.0000 mg | Freq: Once | ORAL | Status: AC
  Administered 2022-12-10: 10 mg via ORAL
  Filled 2022-12-10: qty 10

## 2022-12-10 NOTE — ED Provider Notes (Signed)
[facility] Provider Note    Event Date/Time   First MD Initiated Contact with Patient 12/10/22 0719     (approximate)   History   Insect Bite   HPI  [name] is a 89 y.o. female currently on anticoagulant, history of pacemaker, cardiac disease  Patient was doing well, she reports yesterday she was by her pool arranging things when suddenly she encountered several small "fire ants" that were crawling all over her hands and started biting her hands.  She quickly got them off but since then she has had itching irritation and pain in both hands.  She has tried Benadryl at the house without improvement  She reports throughout the night her hands continue to itch terribly.  The area has also become slightly reddened.  She has not seen any pus or open wounds.  No fevers or chills       Physical Exam   Triage Vital Signs: ED Triage Vitals  Encounter Vitals Group     BP 12/10/22 0641 (!) 172/77     Systolic BP Percentile --      Diastolic BP Percentile --      Pulse Rate 12/10/22 0641 78     Resp 12/10/22 0641 18     Temp 12/10/22 0641 98.3 F (36.8 C)     Temp Source 12/10/22 0641 Oral     SpO2 12/10/22 0641 96 %     Weight 12/10/22 0638 170 lb (77.1 kg)     Height 12/10/22 0638 5\' 5"  (1.651 m)     Head Circumference --      Peak Flow --      Pain Score 12/10/22 0638 3     Pain Loc --      Pain Education --      Exclude from Growth Chart --     Most recent vital signs: Vitals:   12/10/22 0641  BP: (!) 172/77  Pulse: 78  Resp: 18  Temp: 98.3 F (36.8 C)  SpO2: 96%     General: Awake, no distress.  Appears a little uncomfortable as she is actively itching her hands, otherwise in no distress.  Very pleasant. CV:  Good peripheral perfusion.  Strong radial pulses bilateral.  Warm well-perfused digits of both hands Resp:  Normal effort.  Abd:  No distention.  Other:  She has no streaking erythema or phlebitis or lymphangitis on  examination, but she does have mild erythema of both palmar surfaces as well as the ventral surfaces of both hands.  There is very mild erythema.  There is no open wounds pustules or abscesses.  No noted excoriations despite her frequent itching  ED Results / Procedures / Treatments   Labs (all labs ordered are listed, but only abnormal results are displayed) Labs Reviewed - No data to display   EKG     RADIOLOGY     PROCEDURES:  Critical Care performed: No  Procedures   MEDICATIONS ORDERED IN ED: Medications  cetirizine HCl (Zyrtec) 5 MG/5ML solution 10 mg (has no administration in time range)  predniSONE (DELTASONE) tablet 20 mg (has no administration in time range)     IMPRESSION / MDM / ASSESSMENT AND PLAN / ED COURSE  I reviewed the triage vital signs and the nursing notes.                              Differential diagnosis includes, but  is not limited to, irritant from ant bite, fire ant bite etc.  She does not have any pustules which argues against a large fire ant bite, but she does have intense itching and reports many small red ants that bit her hand leading to her immediate symptoms that are difficult to alleviate.  I reviewed her medication list and also in consideration of other factors her age etc. will treat with prednisone briefly as well as Zyrtec.  She will utilize over-the-counter Zyrtec which she reports she has at her home already, and will also utilize prednisone and a steroid cream.  Discussed careful return precautions especially signs and symptoms of potentially developing infection and or spreading illness, but at this juncture I think all of the irritation erythema that is seen is attributable to dermatitis/symptoms from ant bites.   Return precautions and treatment recommendations and follow-up discussed with the patient who is agreeable with the plan.   Patient's presentation is most consistent with acute, uncomplicated illness.           FINAL CLINICAL IMPRESSION(S) / ED DIAGNOSES   Final diagnoses:  Pruritus  Fire ant bite, accidental or unintentional, initial encounter     Rx / DC Orders   ED Discharge Orders          Ordered    predniSONE (DELTASONE) 20 MG tablet  Daily with breakfast        12/10/22 0747    hydrocortisone 1 % ointment  2 times daily        12/10/22 0747             Note:  This document was prepared using Dragon voice recognition software and may include unintentional dictation errors.   Sharyn Creamer, MD 12/10/22 [phone]

## 2022-12-10 NOTE — Discharge Instructions (Signed)
Please utilize over-the-counter Zyrtec to assist with itching.  Additionally please utilize prednisone prescription.  I have also prescribed you an as needed cream that you can use for the next few days to help with itching  Return to the ER if you notice fever, significant pain, increasing discomfort, redness that begins streaking up your arm, areas of pus formation or if other concerns such as infection or worsening arise.

## 2022-12-10 NOTE — ED Triage Notes (Signed)
Pt presents ambulatory to triage via POV with complaints of ant bites to her L hand yesterday that has worsened. Pt notes taking Benadryl last night and using a topical cream without any improvement in her sx. Mild erythema noted to her L hands. A&Ox4 at this time. Denies CP or SOB.

## 2022-12-11 ENCOUNTER — Encounter: Payer: Self-pay | Admitting: Family

## 2022-12-11 ENCOUNTER — Ambulatory Visit (INDEPENDENT_AMBULATORY_CARE_PROVIDER_SITE_OTHER): Payer: Medicare Other | Admitting: Family

## 2022-12-11 VITALS — BP 158/82 | HR 93 | Temp 97.1°F | Ht 65.0 in | Wt 175.0 lb

## 2022-12-11 DIAGNOSIS — T63421D Toxic effect of venom of ants, accidental (unintentional), subsequent encounter: Secondary | ICD-10-CM | POA: Diagnosis not present

## 2022-12-11 MED ORDER — CEPHALEXIN 500 MG PO CAPS: 500.0000 mg | ORAL_CAPSULE | Freq: Three times a day (TID) | ORAL | 0 refills | Status: DC

## 2022-12-11 NOTE — Progress Notes (Signed)
Acute Office Visit  Subjective:     Patient ID: [name], female    DOB: 07-23-1932, 89 y.o.   MRN: [id number]  Chief Complaint  Patient presents with  . Follow-up    Pruritus    HPI Patient is in today as a follow-up from the emergency department yesterday where she was seen for fire ant bite to her left hand and some on her right.  She was prescribed prednisone and cetirizine that she has been tolerating well.  She is concerned that she continues to have redness and the rash is tender to touch with swelling.  The itching gets some better.  Patient reports that she went to pull some weeds and stuck her hand in fire ants she used her opposite hand to wipe them off and was bit on her right hand as well.  She is concerned about the redness began to spread up her left arm.  Review of Systems  Constitutional: Negative.   Respiratory: Negative.    Cardiovascular: Negative.   Musculoskeletal: Negative.   Skin:  Positive for itching and rash.       Fire ant bites to the hands  Psychiatric/Behavioral: Negative.    All other systems reviewed and are negative. Past Medical History:  Diagnosis Date  . Allergic rhinitis   . 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 Anemia   . Arthritis   . Atrial fibrillation -persistent cardiologist-  dr klein/  primary EP -- dr Julian Hy ([facility])   a. s/p PVI [name] 2010;  b. on tikosyn/coumadin;  c. 05/2009 Echo: EF 60-65%, Gr 2 DD. (first dx 09/ 2007)  . Bilateral lower extremity edema   . Bleeding hemorrhoid   . Carotid stenosis    mild (hosp 3/11)- consult by vasc/ Dr Arbie Cookey  . Complication of anesthesia    hard to wake  . Diverticulosis of colon   . Dyspnea    on exertion-climbing stairs  . Fatty liver   . H/O cardiac radiofrequency ablation    01/ 2008 at [place] [place] /  03/ 2010  at [facility]  . Heart failure with preserved ejection fraction (HCC)   . History of adenomatous polyp of colon    tubular adenoma's  . History of cardiomyopathy     secondary tachycardia-induced cardiomyopathy -- resolved 2014  . History of squamous cell carcinoma in situ (SCCIS) of skin    05/ 2017  nasal bridge and right medial knee  . History of transient ischemic attack (TIA)    01-24-2005 and 06-12-2009  . Hyperlipidemia   . Hypothyroidism   . Mild intermittent asthma    reacts to cats  . Mixed stress and urge urinary incontinence   . Presence of permanent cardiac pacemaker    was put in 07/2019  . Pulmonary nodule   . S/P AV nodal ablation 07/28/19 07/29/2019  . S/P mitral valve repair 10-23-1998  dr Elsie Ra at [facility]   for MVP and regurg. (annuloplasty ring procedure)  . S/P placement of cardiac pacemaker MDT 07/28/19 07/29/2019    Social History   Socioeconomic History  . Marital status: Widowed    Spouse name: Not on file  . Number of children: 6  . Years of education: Not on file  . Highest education level: Not on file  Occupational History  . Occupation: Engineer, drilling: RETIRED  Tobacco Use  . Smoking status: Never  . Smokeless tobacco: Never  Vaping Use  . Vaping  status: Never Used  Substance and Sexual Activity  . Alcohol use: Not Currently    Comment: seldom  . Drug use: No  . Sexual activity: Not Currently  Other Topics Concern  . Not on file  Social History Narrative   Retired. Daily Caffeine use: 2 daily    Social Determinants of Health   Financial Resource Strain: Low Risk  (03/18/2022)   Overall Financial Resource Strain (CARDIA)   . Difficulty of Paying Living Expenses: Not hard at all  Food Insecurity: No Food Insecurity (03/18/2022)   Hunger Vital Sign   . Worried About Programme researcher, broadcasting/film/video in the Last Year: Never true   . Ran Out of Food in the Last Year: Never true  Transportation Needs: No Transportation Needs (03/18/2022)   PRAPARE - Transportation   . Lack of Transportation (Medical): No   . Lack of Transportation (Non-Medical): No  Physical Activity: Inactive (03/10/2021)   Exercise  Vital Sign   . Days of Exercise per Week: 0 days   . Minutes of Exercise per Session: 0 min  Stress: No Stress Concern Present (03/18/2022)   [employer] of Occupational Health - Occupational Stress Questionnaire   . Feeling of Stress : Only a little  Social Connections: Moderately Integrated (03/18/2022)   Social Connection and Isolation Panel [NHANES]   . Frequency of Communication with Friends and Family: More than three times a week   . Frequency of Social Gatherings with Friends and Family: Three times a week   . Attends Religious Services: More than 4 times per year   . Active Member of Clubs or Organizations: Yes   . Attends Banker Meetings: More than 4 times per year   . Marital Status: Widowed  Intimate Partner Violence: Not At Risk (03/18/2022)   Humiliation, Afraid, Rape, and Kick questionnaire   . Fear of Current or Ex-Partner: No   . Emotionally Abused: No   . Physically Abused: No   . Sexually Abused: No    Past Surgical History:  Procedure Laterality Date  . APPENDECTOMY  1978  . AV NODE ABLATION N/A 07/28/2019   Procedure: AV NODE ABLATION;  Surgeon: [name] Salvia, MD;  Location: [facility] INVASIVE CV LAB;  Service: Cardiovascular;  Laterality: N/A;  . BUBBLE STUDY  06/19/2019   Procedure: BUBBLE STUDY;  Surgeon: Chrystie Nose, MD;  Location: [facility] ENDOSCOPY;  Service: Cardiovascular;;  . CARDIAC ELECTROPHYSIOLOGY MAPPING AND ABLATION  01/ 2008    at [place] [facility] [place]   right-sided ablation atrial flutter  . CARDIAC ELECTROPHYSIOLOGY STUDY AND ABLATION  03/ 2010   dr Barrett Shell at [facility]   AV node ablation and pulmonary vein isolation for atrial fib  . CARDIOVERSION  06-18-2006;  07-13-2006;  10-19-2010;  10-27-2010  . CATARACT EXTRACTION W/PHACO Right 04/09/2021   Procedure: CATARACT EXTRACTION PHACO AND INTRAOCULAR LENS PLACEMENT (IOC) RIGHT 6.71 01:11.1;  Surgeon: Lockie Mola, MD;  Location: [facility];  Service: Ophthalmology;   Laterality: Right;  . CATARACT EXTRACTION W/PHACO Left 04/23/2021   Procedure: CATARACT EXTRACTION PHACO AND INTRAOCULAR LENS PLACEMENT (IOC) LEFT;  Surgeon: Lockie Mola, MD;  Location: [facility]ERY [facility];  Service: Ophthalmology;  Laterality: Left;  [place] 5.65 00:50.1  . COLONOSCOPY    . COLONOSCOPY WITH PROPOFOL N/A 10/13/2017   Procedure: COLONOSCOPY WITH PROPOFOL;  Surgeon: Wyline Mood, MD;  Location: [facility] ENDOSCOPY;  Service: Gastroenterology;  Laterality: N/A;  . COLONOSCOPY WITH PROPOFOL N/A 02/20/2020   Procedure: COLONOSCOPY WITH PROPOFOL;  Surgeon:  Regis Bill, MD;  Location: [facility] ENDOSCOPY;  Service: Endoscopy;  Laterality: N/A;  . CYSTO/ TRANSURETHRAL COLLAGEN INJECTION THERAPY  07-26-2007   dr Sherron Monday  . DILATION AND CURETTAGE OF UTERUS    . ESOPHAGOGASTRODUODENOSCOPY (EGD) WITH PROPOFOL N/A 10/13/2017   Procedure: ESOPHAGOGASTRODUODENOSCOPY (EGD) WITH PROPOFOL;  Surgeon: Wyline Mood, MD;  Location: [facility] ENDOSCOPY;  Service: Gastroenterology;  Laterality: N/A;  . EVALUATION UNDER ANESTHESIA WITH HEMORRHOIDECTOMY N/A 04/09/2020   Procedure: EXAM UNDER ANESTHESIA WITH HEMORRHOIDECTOMY;  Surgeon: Leafy Ro, MD;  Location: [facility] ORS;  Service: General;  Laterality: N/A;  . EXCISIONAL HEMORRHOIDECTOMY  1980s  . GIVENS CAPSULE STUDY N/A 12/08/2017   Procedure: GIVENS CAPSULE STUDY;  Surgeon: Wyline Mood, MD;  Location: [facility] ENDOSCOPY;  Service: Gastroenterology;  Laterality: N/A;  . HEMORRHOID SURGERY N/A 10/29/2016   Procedure: HEMORRHOIDECTOMY;  Surgeon: Romie Levee, MD;  Location: [facility];  Service: General;  Laterality: N/A;  . MITRAL VALVE ANNULOPLASTY  10/23/1998   "Model [id number]; Ronni Rumble [id number]"; size 32mm; [facility]; Dr. Elsie Ra  . PACEMAKER IMPLANT N/A 07/28/2019   Procedure: PACEMAKER IMPLANT;  Surgeon: [name] Salvia, MD;  Location: [facility] INVASIVE CV LAB;  Service: Cardiovascular;  Laterality: N/A;  . PILONIDAL CYST EXCISION  1954   . RIGHT/LEFT HEART CATH AND CORONARY ANGIOGRAPHY Bilateral 09/21/2022   Procedure: RIGHT/LEFT HEART CATH AND CORONARY ANGIOGRAPHY;  Surgeon: Iran Ouch, MD;  Location: [facility] INVASIVE CV LAB;  Service: Cardiovascular;  Laterality: Bilateral;  . TEE WITH CARDIOVERSION  05-06-2006 at [facility];  01-02-2013 at [facility]  . TEE WITHOUT CARDIOVERSION N/A 06/19/2019   Procedure: TRANSESOPHAGEAL ECHOCARDIOGRAM (TEE);  Surgeon: Chrystie Nose, MD;  Location: [facility] ENDOSCOPY;  Service: Cardiovascular;  Laterality: N/A;  . TOTAL HIP ARTHROPLASTY Left 05/04/2017   Procedure: LEFT TOTAL HIP ARTHROPLASTY ANTERIOR APPROACH;  Surgeon: Kathryne Hitch, MD;  Location: [facility] OR;  Service: Orthopedics;  Laterality: Left;  . TRANSTHORACIC ECHOCARDIOGRAM  05-01-2015   dr Graciela Husbands   ef 50-55%/  mild AV sclerosis without stenosis/  post MV repair with mild central MR (valve area by pressure half-time 2cm^2,  valve area by continutity equation 0.91cm^2, peak grandiant 65mmHg)/  severe LAE/ mild TR/ mild RAE   . TUBAL LIGATION Bilateral 1978    Family History  Problem Relation Age of Onset  . Lung cancer Father        smoker, died at 47  . Alcohol abuse Father   . Cancer Father        bladder and lung CA smoker  . Sudden death Other   . Breast cancer Neg Hx   . Stroke Neg Hx     Allergies  Allergen Reactions  . Amiodarone Swelling    SWELLING REACTION UNSPECIFIED   . Penicillins Hives and Rash    Has patient had a PCN reaction causing immediate rash, facial/tongue/throat swelling, SOB or lightheadedness with hypotension: No Has patient had a PCN reaction causing severe rash involving mucus membranes or skin necrosis: No Has patient had a PCN reaction that required hospitalization:Patient was inpatient when reaction occurred Has patient had a PCN reaction occurring within the last 10 years: No If all of the above answers are "NO", then may proceed with Cephalosporin use  . Metoprolol Other (See Comments)    Hair  loss  . Statins Rash    REACTION: rash    Current Outpatient Medications on File Prior to Visit  Medication Sig Dispense Refill  . Acetaminophen (TYLENOL PO) Take 1 tablet by  mouth as needed.    [name] albuterol (VENTOLIN HFA) 108 (90 Base) MCG/ACT inhaler Inhale 1-2 puffs into the lungs every 6 (six) hours as needed for wheezing or shortness of breath. 18 g 2  . bisoprolol (ZEBETA) 5 MG tablet Take 0.5 tablets (2.5 mg total) by mouth daily. 30 tablet 2  . empagliflozin (JARDIANCE) 10 MG TABS tablet Take 1 tablet (10 mg total) by mouth daily before breakfast. 30 tablet 6  . ferrous sulfate 325 (65 FE) MG EC tablet TAKE 1 TABLET EVERY DAY WITH BREAKFAST 90 tablet 2  . fluticasone (FLONASE) 50 MCG/ACT nasal spray Place 1 spray into both nostrils 2 (two) times daily as needed for allergies or rhinitis. 16 g 11  . furosemide (LASIX) 40 MG tablet Take 1 tablet (40 mg total) by mouth daily. 90 tablet 3  . hydrocortisone 1 % ointment Apply 1 Application topically 2 (two) times daily for 9 days. Apply to hands for itch as needed 28 g 0  . levothyroxine (SYNTHROID) 75 MCG tablet TAKE 1 TABLET EVERY DAY BEFORE BREAKFAST 90 tablet 1  . losartan (COZAAR) 25 MG tablet Take 1 tablet (25 mg total) by mouth at bedtime. 90 tablet 3  . mexiletine (MEXITIL) 200 MG capsule Take 1 capsule (200 mg total) by mouth 2 (two) times daily. 180 capsule 3  . predniSONE (DELTASONE) 20 MG tablet Take 1 tablet (20 mg total) by mouth daily with breakfast for 4 days. 4 tablet 0  . triamcinolone cream (KENALOG) 0.5 % Apply 1 Application topically 2 (two) times daily as needed. 60 g 1  . VITAMIN D PO Take by mouth daily.    [name] warfarin (COUMADIN) 5 MG tablet TAKE 1 TABLET DAILY EXCEPT TAKE 1/2 TABLET ON MONDAYS AND THURSDAYS OR AS DIRECTED BY COUMADIN CLINIC 105 tablet 1   No current facility-administered medications on file prior to visit.    BP (!) 158/82   Pulse 93   Temp (!) 97.1 F (36.2 C) (Temporal)   Ht 5\' 5"  (1.651 m)    Wt 175 lb (79.4 kg)   SpO2 95%   BMI 29.12 kg/m chart      Objective:    BP (!) 158/82   Pulse 93   Temp (!) 97.1 F (36.2 C) (Temporal)   Ht 5\' 5"  (1.651 m)   Wt 175 lb (79.4 kg)   SpO2 95%   BMI 29.12 kg/m    Physical Exam Vitals and nursing note reviewed.  Constitutional:      Appearance: Normal appearance. She is normal weight.  Cardiovascular:     Rate and Rhythm: Normal rate and regular rhythm.  Pulmonary:     Effort: Pulmonary effort is normal.     Breath sounds: Normal breath sounds.  Musculoskeletal:        General: Normal range of motion.     Cervical back: Normal range of motion and neck supple.  Skin:    General: Skin is warm and dry.     Findings: Erythema and rash present.     Comments: Left hand mildly red.  Tender to touch when opening and closing the hand.  Redness extends up the left forearm.  Right hand: Mild redness.  No swelling.  Mild tenderness  Neurological:     General: No focal deficit present.     Mental Status: She is alert and oriented to person, place, and time.  Psychiatric:        Mood and Affect: Mood normal.  Behavior: Behavior normal.   No results found for any visits on 12/11/22.      Assessment & Plan:   Problem List Items Addressed This Visit   None Visit Diagnoses     Fire ant bite, accidental or unintentional, subsequent encounter    -  Primary       Meds ordered this encounter  Medications  . cephALEXin (KEFLEX) 500 MG capsule    Sig: Take 1 capsule (500 mg total) by mouth 3 (three) times daily.    Dispense:  15 capsule    Refill:  0   Complete prednisone.  Continue cetirizine as needed.  Will add cephalexin 500 mg 3 times a day to cover for infection.  Call the office if symptoms worsen or persist.  Recheck as scheduled and sooner as needed. No follow-ups on file.  Eulis Foster, FNP

## 2022-12-14 ENCOUNTER — Telehealth: Payer: Self-pay | Admitting: Internal Medicine

## 2022-12-14 NOTE — Telephone Encounter (Signed)
-----   Message from Sherryl Manges sent at 12/14/2022 10:57 AM EDT ----- Regarding: FW: changes in pt Ladies can we get this woman in to see me or one of the APPs please  Thanks SK ----- Message ----- From: Sherrie George, RN Sent: 12/03/2022   8:42 AM EDT To: [name] Salvia, MD Subject: changes in pt                                  Hello Dr. Graciela Husbands, This pt was in to see me today in the coumadin clinic. She asked that I send a msg to you concerning some symptoms she has been having and that I have personally observed in her. Pt has been experiencing for the last 2 to 2 1/2 months what she describes as "swimmy headed, or head in the clouds". This nurse has noticed pt has had flat effect, she does not seem to care to go anywhere or do anything around the house, and seems frustrated with how she is feeling over the last couple of months. Pt c/o lethargy, some dizziness, no energy to do anything anymore. She reports she cannot even walk to her mailbox anymore. Pt is in coumadin clinic at least every 4 weeks and this nurse has observed these changes in the pt. Inquired if pt has started any new medications and if she checks her blood pressure. Pt reports she started two new medications, bisoprolol in July and mexiletine in Aug. Pt reports she does not regularly check her BP at home but reports when she goes to an OV they seem to be happy with her blood pressure. There appears to be some depression starting due to these symptoms and the inability to do what she was doing just a few months ago. Advised pt to contact cardiology to report symptoms. She asked this nurse to send a msg to cardiology explaining what I have observed concerning her symptoms.  Pt was advised to contact cardiology herself but she asked me to send a msg. Pt was advised if there were any other changes to contact cardiology by phone. Pt verbalized understanding.   Thank you for your help.  Carollee Herter, RN, [place] coumadin clinic nurse

## 2022-12-14 NOTE — Telephone Encounter (Signed)
Left voicemail to call and schedule. Sherie Don, NP has availability.

## 2022-12-15 NOTE — Progress Notes (Unsigned)
Cardiology Office Note Date:  12/16/2022  Patient ID:  [name], [name] Jan 04, 1933, MRN [id number] PCP:  [name], MD  Cardiologist:  None Electrophysiologist: Sherryl Manges, MD  Chief Complaint: fatigue  History of Present Illness: [name] is a 89 y.o. female with PMH notable for perm AFib, HFrEF, NICM, VHD s/p MVr, s/p AVN ablation and PPM implant, CVA, PVCs, hypothyroid; seen today for Dr. Graciela Husbands for routine EP follow-up.   She was seen by me 06/2022 for acute visit d/t "chest twinges" snaking across her L chest and increased fatigue. She was found to be in bigeminal PVCs in office. BB was started at that visit, and an updated echo revealed newly reduced LVEF. LHC showed no significant CAD. She was seen by Dr. Gasper Lloyd with Adv HF in 09/2022 to establish care, adjusted GDMT.  She saw Dr. Graciela Husbands 10/2022, she continued to have freq PVCs and with amiodarone allergy/intolerance, he started mexiletine to try to reduce PVC burden. Considering CRT upgrade I last saw her two weeks ago where her VP percentage was significantly higher at 98%.  Coumadin nurse messaged providers concerned regarding patient's lethargy, reduced energy, flat affect, and her "swimmy-headedness"  On follow-up today, patient is nauseous and having severe stomach cramps. She is having cold sweats currently. These symptoms started about 20 minutes ago. She had a protein bar for breakfast this morning and a cup of coffee. Took all meds this AM. This she ate the protein bar too fast and it's causing current symptoms.  She is currently holding an emesis bag. Drove herself to clinic this AM.  She denies chest pain, palpitations, dyspnea, PND, orthopnea, syncope, edema, weight gain, or early satiety.      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 s/p PVI [name] 2010;  b. on tikosyn/coumadin;  c. 05/2009 Echo: EF 60-65%, Gr 2 DD. (first dx 09/ 2007)   Bilateral lower extremity edema    Bleeding hemorrhoid    Carotid stenosis    mild (hosp 3/11)- consult by vasc/ Dr Arbie Cookey   Complication of anesthesia    hard to wake   Diverticulosis of colon    Dyspnea    on exertion-climbing stairs   Fatty liver    H/O cardiac radiofrequency ablation    01/ 2008 at [place] [facility] [place] /  03/ 2010  at [facility] failure with preserved ejection fraction [facility])    History of adenomatous polyp of colon    tubular adenoma's   History of cardiomyopathy    secondary tachycardia-induced cardiomyopathy -- resolved 2014   History of squamous cell carcinoma in situ (SCCIS) of skin    05/ 2017  nasal bridge and right medial knee   History of transient ischemic attack (TIA)    01-24-2005 and 06-12-2009   Hyperlipidemia    Hypothyroidism    Mild intermittent asthma    reacts to cats   Mixed stress and urge urinary incontinence    Presence of permanent  cardiac pacemaker    was put in 07/2019   Pulmonary nodule    S/P AV nodal ablation 07/28/19 07/29/2019   S/P mitral valve repair 10-23-1998  dr Elsie Ra at [facility]   for MVP and regurg. (annuloplasty ring procedure)   S/P placement of cardiac pacemaker MDT 07/28/19 07/29/2019    Past Surgical History:  Procedure Laterality Date   APPENDECTOMY  1978   AV NODE ABLATION N/A 07/28/2019   Procedure: AV NODE ABLATION;  Surgeon: [name] Salvia, MD;  Location: [facility] INVASIVE CV LAB;  Service: Cardiovascular;  Laterality: N/A;   BUBBLE STUDY  06/19/2019   Procedure: BUBBLE STUDY;  Surgeon: Chrystie Nose, MD;  Location: [facility] ENDOSCOPY;   Service: Cardiovascular;;   CARDIAC ELECTROPHYSIOLOGY MAPPING AND ABLATION  01/ 2008    at [place] [facility] [place]   right-sided ablation atrial flutter   CARDIAC ELECTROPHYSIOLOGY STUDY AND ABLATION  03/ 2010   dr Barrett Shell at [facility]   AV node ablation and pulmonary vein isolation for atrial fib   CARDIOVERSION  06-18-2006;  07-13-2006;  10-19-2010;  10-27-2010   CATARACT EXTRACTION W/PHACO Right 04/09/2021   Procedure: CATARACT EXTRACTION PHACO AND INTRAOCULAR LENS PLACEMENT (IOC) RIGHT 6.71 01:11.1;  Surgeon: Lockie Mola, MD;  Location: [facility] SURGERY CNTR;  Service: Ophthalmology;  Laterality: Right;   CATARACT EXTRACTION W/PHACO Left 04/23/2021   Procedure: CATARACT EXTRACTION PHACO AND INTRAOCULAR LENS PLACEMENT (IOC) LEFT;  Surgeon: Lockie Mola, MD;  Location: [facility] SURGERY CNTR;  Service: Ophthalmology;  Laterality: Left;  [place] 5.65 00:50.1   COLONOSCOPY     COLONOSCOPY WITH PROPOFOL N/A 10/13/2017   Procedure: COLONOSCOPY WITH PROPOFOL;  Surgeon: Wyline Mood, MD;  Location: [facility] ENDOSCOPY;  Service: Gastroenterology;  Laterality: N/A;   COLONOSCOPY WITH PROPOFOL N/A 02/20/2020   Procedure: COLONOSCOPY WITH PROPOFOL;  Surgeon: Regis Bill, MD;  Location: [facility] ENDOSCOPY;  Service: Endoscopy;  Laterality: N/A;   CYSTO/ TRANSURETHRAL COLLAGEN INJECTION THERAPY  07-26-2007   dr Sherron Monday   DILATION AND CURETTAGE OF UTERUS     ESOPHAGOGASTRODUODENOSCOPY (EGD) WITH PROPOFOL N/A 10/13/2017   Procedure: ESOPHAGOGASTRODUODENOSCOPY (EGD) WITH PROPOFOL;  Surgeon: Wyline Mood, MD;  Location: [facility] ENDOSCOPY;  Service: Gastroenterology;  Laterality: N/A;   EVALUATION UNDER ANESTHESIA WITH HEMORRHOIDECTOMY N/A 04/09/2020   Procedure: EXAM UNDER ANESTHESIA WITH HEMORRHOIDECTOMY;  Surgeon: Leafy Ro, MD;  Location: [facility] ORS;  Service: General;  Laterality: N/A;   EXCISIONAL HEMORRHOIDECTOMY  1980s   GIVENS CAPSULE STUDY N/A 12/08/2017   Procedure: GIVENS CAPSULE STUDY;   Surgeon: Wyline Mood, MD;  Location: [facility] ENDOSCOPY;  Service: Gastroenterology;  Laterality: N/A;   HEMORRHOID SURGERY N/A 10/29/2016   Procedure: HEMORRHOIDECTOMY;  Surgeon: Romie Levee, MD;  Location: [facility];  Service: General;  Laterality: N/A;   MITRAL VALVE ANNULOPLASTY  10/23/1998   "Model [id number]; Ronni Rumble [id number]"; size 32mm; [facility]; Dr. Elsie Ra   PACEMAKER IMPLANT N/A 07/28/2019   Procedure: PACEMAKER IMPLANT;  Surgeon: [name] Salvia, MD;  Location: [facility] INVASIVE CV LAB;  Service: Cardiovascular;  Laterality: N/A;   PILONIDAL CYST EXCISION  1954   RIGHT/LEFT HEART CATH AND CORONARY ANGIOGRAPHY Bilateral 09/21/2022   Procedure: RIGHT/LEFT HEART CATH AND CORONARY ANGIOGRAPHY;  Surgeon: Iran Ouch, MD;  Location: [facility] INVASIVE CV LAB;  Service: Cardiovascular;  Laterality: Bilateral;   TEE WITH CARDIOVERSION  05-06-2006 at [facility];  01-02-2013 at [facility]   TEE WITHOUT CARDIOVERSION N/A 06/19/2019   Procedure: TRANSESOPHAGEAL ECHOCARDIOGRAM (TEE);  Surgeon: Chrystie Nose, MD;  Location: [facility] ENDOSCOPY;  Service: Cardiovascular;  Laterality: N/A;   TOTAL HIP ARTHROPLASTY Left 05/04/2017   Procedure: LEFT TOTAL HIP ARTHROPLASTY ANTERIOR APPROACH;  Surgeon: Kathryne Hitch, MD;  Location: [facility] OR;  Service: Orthopedics;  Laterality: Left;   TRANSTHORACIC ECHOCARDIOGRAM  05-01-2015   dr Graciela Husbands   ef 50-55%/  mild AV sclerosis without stenosis/  post MV repair with mild central MR (valve area by pressure half-time 2cm^2,  valve area by continutity equation 0.91cm^2, peak grandiant 50mmHg)/  severe LAE/ mild TR/ mild RAE    TUBAL LIGATION Bilateral 1978    Current Outpatient Medications  Medication Instructions   Acetaminophen (TYLENOL PO) 1 tablet, Oral, As needed   albuterol (VENTOLIN HFA) 108 (90 Base) MCG/ACT inhaler 1-2 puffs, Inhalation, Every 6 hours PRN   bisoprolol (ZEBETA) 2.5 mg, Oral, Daily   cephALEXin (KEFLEX) 500 mg, Oral, 3 times daily    empagliflozin (JARDIANCE) 10 mg, Oral, Daily before breakfast   ferrous sulfate 325 (65 FE) MG EC tablet TAKE 1 TABLET EVERY DAY WITH BREAKFAST   fluticasone (FLONASE) 50 MCG/ACT nasal spray 1 spray, Each Nare, 2 times daily PRN   furosemide (LASIX) 40 mg, Oral, Daily   hydrocortisone 1 % ointment 1 Application, Topical, 2 times daily, Apply to hands for itch as needed   levothyroxine (SYNTHROID) 75 MCG tablet TAKE 1 TABLET EVERY DAY BEFORE BREAKFAST   losartan (COZAAR) 25 mg, Oral, Daily at bedtime   mexiletine (MEXITIL) 200 mg, Oral, 2 times daily   triamcinolone cream (KENALOG) 0.5 % 1 Application, Topical, 2 times daily PRN   VITAMIN D PO Oral, Daily   warfarin (COUMADIN) 5 MG tablet TAKE 1 TABLET DAILY EXCEPT TAKE 1/2 TABLET ON MONDAYS AND THURSDAYS OR AS DIRECTED BY COUMADIN CLINIC    Social History:  The patient  reports that she has never smoked. She has never used smokeless tobacco. She reports that she does not currently use alcohol. She reports that she does not use drugs.   Family History:  The patient's family history includes Alcohol abuse in her father; Cancer in her father; Lung cancer in her father; Sudden death in an other family member.  ROS:  Please see the history of present illness. All other systems are reviewed and otherwise negative.   PHYSICAL EXAM:  VS:  BP 122/60 (BP Location: Left Arm, Patient Position: Sitting, Cuff Size: Normal)   Pulse 71   Ht 5' 5.5" (1.664 m)   Wt 171 lb (77.6 kg)   SpO2 99%   BMI 28.02 kg/m  BMI: Body mass index is 28.02 kg/m.  GEN- The patient is ill-appearing, pale, diaphoretic. .   Lungs- Clear to ausculation bilaterally, normal work of breathing.  Heart- Regular rate and rhythm, no murmurs, rubs or gallops Extremities- Trace peripheral edema, warm, dry Skin- diaphoretic  device pocket well-healed, no tethering   Device interrogation not done d/t patient's condition  EKG is ordered. Personal review of EKG from today  shows:  EKG Interpretation Date/Time:  Wednesday December 16 2022 10:26:35 EDT Ventricular Rate:  71 PR Interval:    QRS Duration:  192 QT Interval:  464 QTC Calculation: 504 R Axis:   -71  Text Interpretation: Ventricular-paced rhythm When compared with ECG of 01-Dec-2022 13:42, Vent. rate has decreased BY   2 BPM Confirmed by Sherie Don [phone]) on 12/16/2022 11:58:24 AM    Recent Labs:  POC blood glucose - 115   03/02/2022: B Natriuretic Peptide 197.7 07/20/2022: ALT 28; TSH 3.161 11/03/2022: Hemoglobin 13.1; Platelets  159 12/01/2022: BUN 31; Creatinine, Ser 1.09; Potassium 4.2; Sodium 143  03/12/2022: Cholesterol 194; HDL 34.80; LDL Cholesterol 134; Total CHOL/HDL Ratio 6; Triglycerides 125.0; VLDL 25.0   Estimated Creatinine Clearance: 37.2 mL/min (A) (by C-G formula based on SCr of 1.09 mg/dL (H)).   Wt Readings from Last 3 Encounters:  12/16/22 171 lb (77.6 kg)  12/16/22 171 lb (77.6 kg)  12/11/22 175 lb (79.4 kg)     Additional studies reviewed include: Previous EP, cardiology notes.   R/LHC, 09/21/2022   3rd RPL lesion is 100% stenosed.   There is moderate to severe left ventricular systolic dysfunction.   LV end diastolic pressure is normal.   The left ventricular ejection fraction is 25-35% by visual estimate.  1.  No significant coronary artery disease with the exception of an occluded small RPL branch with collaterals from the left circumflex. 2.  Moderately to severely reduced LV systolic function. 3.  Right heart catheterization showed high normal filling pressures, normal pulmonary pressure and normal cardiac output.  RA: 4 mmHg PW: 11/22 with a mean of 13 mmHg.  Prominent V wave suggestive of at least moderate mitral regurgitation. PA: 31/13 with a mean of 20 mmHg Cardiac output: 4.72 with an index of 2.55.  TTE, 08/18/2022  1. Left ventricular ejection fraction, by estimation, is 25 to 30%. The left ventricle has severely decreased function. The left  ventricle demonstrates global hypokinesis. Left ventricular diastolic parameters are indeterminate.   2. Right ventricular systolic function is low normal. The right ventricular size is normal. There is moderately elevated pulmonary artery systolic pressure.   3. Left atrial size was severely dilated.   4. Right atrial size was moderately dilated.   5. The mitral valve has been repaired/replaced. Mild mitral valve regurgitation.   6. Tricuspid valve regurgitation is mild to moderate.   7. The aortic valve is tricuspid. Aortic valve regurgitation is not visualized. Aortic valve sclerosis/calcification is present, without any evidence of aortic stenosis.   8. The inferior vena cava is dilated in size with <50% respiratory variability, suggesting right atrial pressure of 15 mmHg.   Comparison(s): 06/10/19: 55-60%, MV annuloplasty ring (10/1998), RVSP 37, LAE, mild MS, m/m MR, mass on PMVL.   ASSESSMENT AND PLAN:  #) nausea #) vomiting #) stomach cramps #) diaphoretic Hemodynamically stable BG 115 on POC.  Patient requested to go to bathroom and spent >73minutes She needed significant assistance ambulating to bathroom, which is not her norm She continued to have intense abd cramps, with dry heaves and clamminess.  I did not feel that it was safe for her to drive herself home. I attempted to reach her children, Tenny Craw and Roxanna.  Recommended patient proceed to ER for further evaluation, patient agreeable to this Clinic staff assisted patient to ER via wheelchair    Will readdress cardiac conditions and complaints at follow-up No medication changes   Current medicines are reviewed at length with the patient today.   The patient does not have concerns regarding her medicines.  The following changes were made today:   None  Labs/ tests ordered today include:  Orders Placed This Encounter  Procedures   EKG 12-Lead     Disposition: Follow up with EP app in 1-2  weeks    Signed, Sherie Don, NP  12/16/22  11:58 AM  Electrophysiology [facility]

## 2022-12-16 ENCOUNTER — Emergency Department: Payer: Medicare Other

## 2022-12-16 ENCOUNTER — Emergency Department
Admission: EM | Admit: 2022-12-16 | Discharge: 2022-12-16 | Disposition: A | Discharge status: Home / Self Care | Payer: Medicare Other | Attending: Emergency Medicine | Admitting: Emergency Medicine

## 2022-12-16 ENCOUNTER — Encounter: Payer: Self-pay | Admitting: Cardiology

## 2022-12-16 ENCOUNTER — Ambulatory Visit: Payer: Medicare Other | Attending: Cardiology | Admitting: Cardiology

## 2022-12-16 ENCOUNTER — Other Ambulatory Visit: Payer: Self-pay

## 2022-12-16 ENCOUNTER — Inpatient Hospital Stay: Payer: Medicare Other | Admitting: Family Medicine

## 2022-12-16 VITALS — BP 122/60 | HR 71 | Ht 65.5 in | Wt 171.0 lb

## 2022-12-16 DIAGNOSIS — D259 Leiomyoma of uterus, unspecified: Secondary | ICD-10-CM | POA: Diagnosis not present

## 2022-12-16 DIAGNOSIS — Z950 Presence of cardiac pacemaker: Secondary | ICD-10-CM | POA: Insufficient documentation

## 2022-12-16 DIAGNOSIS — Z9889 Other specified postprocedural states: Secondary | ICD-10-CM | POA: Diagnosis not present

## 2022-12-16 DIAGNOSIS — R1030 Lower abdominal pain, unspecified: Secondary | ICD-10-CM | POA: Diagnosis not present

## 2022-12-16 DIAGNOSIS — R55 Syncope and collapse: Secondary | ICD-10-CM | POA: Insufficient documentation

## 2022-12-16 DIAGNOSIS — I5020 Unspecified systolic (congestive) heart failure: Secondary | ICD-10-CM | POA: Diagnosis not present

## 2022-12-16 DIAGNOSIS — R109 Unspecified abdominal pain: Secondary | ICD-10-CM

## 2022-12-16 DIAGNOSIS — K449 Diaphragmatic hernia without obstruction or gangrene: Secondary | ICD-10-CM | POA: Diagnosis not present

## 2022-12-16 DIAGNOSIS — I700 Atherosclerosis of aorta: Secondary | ICD-10-CM | POA: Diagnosis not present

## 2022-12-16 DIAGNOSIS — Z7901 Long term (current) use of anticoagulants: Secondary | ICD-10-CM | POA: Diagnosis not present

## 2022-12-16 DIAGNOSIS — I4821 Permanent atrial fibrillation: Secondary | ICD-10-CM | POA: Insufficient documentation

## 2022-12-16 DIAGNOSIS — Z79899 Other long term (current) drug therapy: Secondary | ICD-10-CM | POA: Diagnosis not present

## 2022-12-16 DIAGNOSIS — R531 Weakness: Secondary | ICD-10-CM | POA: Insufficient documentation

## 2022-12-16 DIAGNOSIS — I493 Ventricular premature depolarization: Secondary | ICD-10-CM | POA: Insufficient documentation

## 2022-12-16 DIAGNOSIS — Z20822 Contact with and (suspected) exposure to covid-19: Secondary | ICD-10-CM | POA: Diagnosis not present

## 2022-12-16 DIAGNOSIS — K5730 Diverticulosis of large intestine without perforation or abscess without bleeding: Secondary | ICD-10-CM | POA: Insufficient documentation

## 2022-12-16 DIAGNOSIS — R110 Nausea: Secondary | ICD-10-CM | POA: Diagnosis not present

## 2022-12-16 DIAGNOSIS — N281 Cyst of kidney, acquired: Secondary | ICD-10-CM | POA: Diagnosis not present

## 2022-12-16 LAB — CBC
HCT: 41.9 % (ref 36.0–46.0)
Hemoglobin: 13.7 g/dL (ref 12.0–15.0)
MCH: 28.9 pg (ref 26.0–34.0)
MCHC: 32.7 g/dL (ref 30.0–36.0)
MCV: 88.4 fL (ref 80.0–100.0)
Platelets: 174 10*3/uL (ref 150–400)
RBC: 4.74 MIL/uL (ref 3.87–5.11)
RDW: 15.3 % (ref 11.5–15.5)
WBC: 6.8 10*3/uL (ref 4.0–10.5)
nRBC: 0 % (ref 0.0–0.2)

## 2022-12-16 LAB — COMPREHENSIVE METABOLIC PANEL
ALT: 21 U/L (ref 0–44)
AST: 19 U/L (ref 15–41)
Albumin: 4 g/dL (ref 3.5–5.0)
Alkaline Phosphatase: 41 U/L (ref 38–126)
Anion gap: 10 (ref 5–15)
BUN: 23 mg/dL (ref 8–23)
CO2: 28 mmol/L (ref 22–32)
Calcium: 9 mg/dL (ref 8.9–10.3)
Chloride: 102 mmol/L (ref 98–111)
Creatinine, Ser: 0.97 mg/dL (ref 0.44–1.00)
GFR, Estimated: 56 mL/min — ABNORMAL LOW (ref >60)
Glucose, Bld: 127 mg/dL — ABNORMAL HIGH (ref 70–99)
Potassium: 3.1 mmol/L — ABNORMAL LOW (ref 3.5–5.1)
Sodium: 140 mmol/L (ref 135–145)
Total Bilirubin: 1.3 mg/dL — ABNORMAL HIGH (ref 0.3–1.2)
Total Protein: 7.9 g/dL (ref 6.5–8.1)

## 2022-12-16 LAB — URINALYSIS, ROUTINE W REFLEX MICROSCOPIC
Bacteria, UA: NONE SEEN
Bilirubin Urine: NEGATIVE
Glucose, UA: >=500 mg/dL — AB
Ketones, ur: NEGATIVE mg/dL
Leukocytes,Ua: NEGATIVE
Nitrite: NEGATIVE
Protein, ur: NEGATIVE mg/dL
Specific Gravity, Urine: 1.036 — ABNORMAL HIGH (ref 1.005–1.030)
pH: 6 (ref 5.0–8.0)

## 2022-12-16 LAB — RESP PANEL BY RT-PCR (RSV, FLU A&B, COVID)  RVPGX2
Influenza A by PCR: NEGATIVE
Influenza B by PCR: NEGATIVE
Resp Syncytial Virus by PCR: NEGATIVE
SARS Coronavirus 2 by RT PCR: NEGATIVE

## 2022-12-16 LAB — LIPASE, BLOOD: Lipase: 32 U/L (ref 11–51)

## 2022-12-16 LAB — TROPONIN I (HIGH SENSITIVITY)
Troponin I (High Sensitivity): 9 ng/L (ref <18)
Troponin I (High Sensitivity): 9 ng/L (ref <18)

## 2022-12-16 MED ORDER — POTASSIUM CHLORIDE CRYS ER 20 MEQ PO TBCR
40.0000 meq | EXTENDED_RELEASE_TABLET | Freq: Once | ORAL | Status: AC
  Administered 2022-12-16: 40 meq via ORAL
  Filled 2022-12-16: qty 2

## 2022-12-16 MED ORDER — POTASSIUM CHLORIDE CRYS ER 20 MEQ PO TBCR: 20.0000 meq | EXTENDED_RELEASE_TABLET | Freq: Every day | ORAL | 0 refills | Status: DC

## 2022-12-16 MED ORDER — IOHEXOL 300 MG/ML  SOLN
100.0000 mL | Freq: Once | INTRAMUSCULAR | Status: AC | PRN
  Administered 2022-12-16: 100 mL via INTRAVENOUS

## 2022-12-16 NOTE — Discharge Instructions (Addendum)
Try to eat a bland diet for the next several days  I'd recommend a stool softener daily to help prevent constipation - this can be purchased over-the-counter  Drink at least 6-8 glasses of water daily  Your potassium was low today - we will order replacement, f/u with your primary in 1 week for repeat labs

## 2022-12-16 NOTE — ED Notes (Signed)
Patient ambulated in room to restroom with no issues

## 2022-12-16 NOTE — ED Provider Notes (Signed)
[facility] Provider Note    Event Date/Time   First MD Initiated Contact with Patient 12/16/22 1227     (approximate)   History   Abdominal Pain and Near Syncope (Patient was at her cardiologist's office and had sudden onset of stomach cramping and near-syncope; She went to the restroom and had some "soft stool" but not diarrhea; Patient no longer has stomach cramps but states she is very fatigued)   HPI  DEL GIM is a 89 y.o. female  here with near syncope. Pt reports that she recently has been dealing with rash, itching to her left hand after multiple red ant bites. She has been on ABX and steroids for this. It is improving, however. Earlier today, she was in a hurry and ate a fiber bar and drank a cup of coffee, then went to her Cardiology appointment. There, she began to develop aching, cramping lower abdominal pain. She began to feel nauseous and like she was going to pass out. She went to the bathroom and had a loose, btu not bloody or liquid stool. She feels fatigued but o/w normal now. No other complaints. No recent med changes. No CP, palpitations.       Physical Exam   Triage Vital Signs: ED Triage Vitals  Encounter Vitals Group     BP 12/16/22 1136 123/66     Systolic BP Percentile --      Diastolic BP Percentile --      Pulse Rate 12/16/22 1136 70     Resp 12/16/22 1136 18     Temp 12/16/22 1136 97.7 F (36.5 C)     Temp Source 12/16/22 1136 Oral     SpO2 12/16/22 1136 97 %     Weight 12/16/22 1145 171 lb (77.6 kg)     Height 12/16/22 1145 5' 5.5" (1.664 m)     Head Circumference --      Peak Flow --      Pain Score 12/16/22 1144 0     Pain Loc --      Pain Education --      Exclude from Growth Chart --     Most recent vital signs: Vitals:   12/16/22 1709 12/16/22 1711  BP:  (!) 153/89  Pulse:  72  Resp:  16  Temp: 97.8 F (36.6 C)   SpO2:  97%     General: Awake, no distress.  CV:  Good peripheral perfusion.  RRR. Resp:  Normal work of breathing. Lungs clear bilaterally. Abd:  No distention. Minimal suprapubic TTP, no rebound or guarding. Other:  CNII-XII intact. Strength 5/5 bl UE and LE. Normal sensation to light touch. Normal gait.   ED Results / Procedures / Treatments   Labs (all labs ordered are listed, but only abnormal results are displayed) Labs Reviewed  COMPREHENSIVE METABOLIC PANEL - Abnormal; Notable for the following components:      Result Value   Potassium 3.1 (*)    Glucose, Bld 127 (*)    Total Bilirubin 1.3 (*)    GFR, Estimated 56 (*)    All other components within normal limits  URINALYSIS, ROUTINE W REFLEX MICROSCOPIC - Abnormal; Notable for the following components:   Color, Urine YELLOW (*)    APPearance CLEAR (*)    Specific Gravity, Urine 1.036 (*)    Glucose, UA >=500 (*)    Hgb urine dipstick SMALL (*)    All other components within normal limits  RESP PANEL BY  RT-PCR (RSV, FLU A&B, COVID)  RVPGX2  CBC  LIPASE, BLOOD  TROPONIN I (HIGH SENSITIVITY)  TROPONIN I (HIGH SENSITIVITY)     EKG Ventricular paced rhythm, VR 72. QRS 194, QTc 521. No Sgarbossa criteria.   RADIOLOGY CT A/P: No acute findings CXR: Clea   I also independently reviewed and agree with radiologist interpretations.   PROCEDURES:  Critical Care performed: No  .1-3 Lead EKG Interpretation  Performed by: Shaune Pollack, MD Authorized by: Shaune Pollack, MD     Interpretation: normal     ECG rate:  70-90   ECG rate assessment: normal     Rhythm: sinus rhythm     Ectopy: none     Conduction: normal   Comments:     Indication: Syncope     MEDICATIONS ORDERED IN ED: Medications  iohexol (OMNIPAQUE) 300 MG/ML solution 100 mL (100 mLs Intravenous Contrast Given 12/16/22 1313)  potassium chloride SA (KLOR-CON M) CR tablet 40 mEq (40 mEq Oral Given 12/16/22 1708)     IMPRESSION / MDM / ASSESSMENT AND PLAN / ED COURSE  I reviewed the triage vital signs and the nursing  notes.                              Differential diagnosis includes, but is not limited to, near syncope,   Patient's presentation is most consistent with acute presentation with potential threat to life or bodily function.  The patient is on the cardiac monitor to evaluate for evidence of arrhythmia and/or significant heart rate changes  89 yo F here with transient abdominal pain, now resolved after BM. Unclear etiology - suspect it was related to colonic spasm or tenesmus, likely exacerbated by her diet today as well as steroids, recent ABX for her hand. She is o/w well appearing now and asymptomatic. EKG is nonischemic, with no arrhythmia, and troponin is negative - do not suspect ACS. CBC shows no leukocytosis or anemia. CMP with normal renal function.UA unremarkable. CXR is clear  CT A/P reviewed and is unremarkable.  Pt sx are now resolved, she is able to ambulate, and tolerate PO. She has negative imaging, lab work, and sx all correlate with a sudden abd pain, urge to have BM, and resolution after this. Will encourage bowel regimen at home, PCP f/u.   FINAL CLINICAL IMPRESSION(S) / ED DIAGNOSES   Final diagnoses:  Near syncope  Abdominal pain, unspecified abdominal location     Rx / DC Orders   ED Discharge Orders          Ordered    potassium chloride SA (KLOR-CON M) 20 MEQ tablet  Daily        12/16/22 1603             Note:  This document was prepared using Dragon voice recognition software and may include unintentional dictation errors.   Shaune Pollack, MD 12/16/22 2005

## 2022-12-16 NOTE — ED Triage Notes (Signed)
First nurse note: pt to ED from cardiology follow up for severe abd pain with emesis while in office.  Pt drove self to dr appt and states cannot drive home, office states was unable to get in touch with family

## 2022-12-16 NOTE — ED Notes (Signed)
EDP to bedside.

## 2022-12-16 NOTE — ED Notes (Signed)
Patient given water

## 2022-12-16 NOTE — ED Provider Notes (Signed)
-----------------------------------------   5:16 PM on 12/16/2022 -----------------------------------------  I took over care of this patient from Dr. Erma Heritage.  Urinalysis is negative.  The patient remains asymptomatic.  She is stable for discharge home.  I counseled her on the results of the workup.  I gave strict return precautions and she expressed understanding.   Dionne Bucy, MD 12/16/22 [phone]

## 2022-12-16 NOTE — ED Triage Notes (Signed)
Patient was at her cardiologist's office and had sudden onset of stomach cramping and near-syncope; She went to the restroom and had some "soft stool" but not diarrhea; Patient no longer has stomach cramps but states she is very fatigued

## 2022-12-19 NOTE — Progress Notes (Deleted)
Cardiology Office Note Date:  12/19/2022  Patient ID:  [name], [name] 1933-02-08, MRN [id number] PCP:  [name], MD  Cardiologist:  None Electrophysiologist: Sherryl Manges, MD  Chief Complaint: ***  History of Present Illness: [name] is a 89 y.o. female with PMH notable for perm AFib, HFrEF, NICM, VHD s/p MVr, s/p AVN ablation and PPM implant, CVA, PVCs, hypothyroid; seen today for Dr. Graciela Husbands for routine EP follow-up.   She was seen by me 06/2022 for acute visit d/t "chest twinges" snaking across her L chest and increased fatigue. She was found to be in bigeminal PVCs in office. BB was started at that visit, and an updated echo revealed newly reduced LVEF. LHC showed no significant CAD. She was seen by Dr. Gasper Lloyd with Adv HF in 09/2022 to establish care, adjusted GDMT.  She saw Dr. Graciela Husbands 10/2022, she continued to have freq PVCs and with amiodarone allergy/intolerance, he started mexiletine to try to reduce PVC burden. Considering CRT upgrade I last saw her two weeks ago where her VP percentage was significantly higher at 98%.  Coumadin nurse messaged providers concerned regarding patient's lethargy, reduced energy, flat affect, and her "swimmy-headedness"  On follow-up today,  *** AF burden, symptoms *** palpitations *** bleeding concerns  *** narrow down when symptoms started  - switch to toprol 25?  She denies chest pain, palpitations, dyspnea, PND, orthopnea, syncope, edema, nausea, vomiting, weight gain, or early satiety.      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 s/p PVI [name] 2010;  b. on tikosyn/coumadin;  c. 05/2009 Echo: EF 60-65%, Gr 2 DD. (first dx 09/ 2007)   Bilateral lower extremity edema    Bleeding hemorrhoid    Carotid stenosis    mild (hosp 3/11)- consult by vasc/ Dr Arbie Cookey   Complication of anesthesia    hard to wake   Diverticulosis of colon    Dyspnea    on exertion-climbing stairs   Fatty liver    H/O cardiac radiofrequency ablation    01/ 2008 at [place] [facility] [place] /  03/ 2010  at [facility] failure with preserved ejection fraction [facility])    History of adenomatous polyp of colon    tubular adenoma's   History of cardiomyopathy    secondary tachycardia-induced cardiomyopathy -- resolved 2014   History of squamous cell carcinoma in situ (SCCIS) of skin    05/ 2017  nasal bridge and right medial knee   History of transient ischemic attack (TIA)    01-24-2005 and 06-12-2009   Hyperlipidemia    Hypothyroidism    Mild intermittent asthma    reacts to cats   Mixed stress and urge urinary incontinence    Presence of permanent cardiac pacemaker    was put in 07/2019   Pulmonary nodule    S/P AV nodal ablation 07/28/19 07/29/2019   S/P mitral valve repair 10-23-1998  dr Elsie Ra at [facility]   for MVP and regurg. (annuloplasty  ring procedure)   S/P placement of cardiac pacemaker MDT 07/28/19 07/29/2019    Past Surgical History:  Procedure Laterality Date   APPENDECTOMY  1978   AV NODE ABLATION N/A 07/28/2019   Procedure: AV NODE ABLATION;  Surgeon: [name] Salvia, MD;  Location: [facility] INVASIVE CV LAB;  Service: Cardiovascular;  Laterality: N/A;   BUBBLE STUDY  06/19/2019   Procedure: BUBBLE STUDY;  Surgeon: Chrystie Nose, MD;  Location: [facility] ENDOSCOPY;  Service: Cardiovascular;;   CARDIAC ELECTROPHYSIOLOGY MAPPING AND ABLATION  01/ 2008    at [place] [facility] [place]   right-sided ablation atrial flutter   CARDIAC ELECTROPHYSIOLOGY STUDY AND ABLATION  03/ 2010   dr Barrett Shell at [facility]    AV node ablation and pulmonary vein isolation for atrial fib   CARDIOVERSION  06-18-2006;  07-13-2006;  10-19-2010;  10-27-2010   CATARACT EXTRACTION W/PHACO Right 04/09/2021   Procedure: CATARACT EXTRACTION PHACO AND INTRAOCULAR LENS PLACEMENT (IOC) RIGHT 6.71 01:11.1;  Surgeon: Lockie Mola, MD;  Location: [facility] SURGERY CNTR;  Service: Ophthalmology;  Laterality: Right;   CATARACT EXTRACTION W/PHACO Left 04/23/2021   Procedure: CATARACT EXTRACTION PHACO AND INTRAOCULAR LENS PLACEMENT (IOC) LEFT;  Surgeon: Lockie Mola, MD;  Location: [facility] SURGERY CNTR;  Service: Ophthalmology;  Laterality: Left;  [place] 5.65 00:50.1   COLONOSCOPY     COLONOSCOPY WITH PROPOFOL N/A 10/13/2017   Procedure: COLONOSCOPY WITH PROPOFOL;  Surgeon: Wyline Mood, MD;  Location: [facility] ENDOSCOPY;  Service: Gastroenterology;  Laterality: N/A;   COLONOSCOPY WITH PROPOFOL N/A 02/20/2020   Procedure: COLONOSCOPY WITH PROPOFOL;  Surgeon: Regis Bill, MD;  Location: [facility] ENDOSCOPY;  Service: Endoscopy;  Laterality: N/A;   CYSTO/ TRANSURETHRAL COLLAGEN INJECTION THERAPY  07-26-2007   dr Sherron Monday   DILATION AND CURETTAGE OF UTERUS     ESOPHAGOGASTRODUODENOSCOPY (EGD) WITH PROPOFOL N/A 10/13/2017   Procedure: ESOPHAGOGASTRODUODENOSCOPY (EGD) WITH PROPOFOL;  Surgeon: Wyline Mood, MD;  Location: [facility] ENDOSCOPY;  Service: Gastroenterology;  Laterality: N/A;   EVALUATION UNDER ANESTHESIA WITH HEMORRHOIDECTOMY N/A 04/09/2020   Procedure: EXAM UNDER ANESTHESIA WITH HEMORRHOIDECTOMY;  Surgeon: Leafy Ro, MD;  Location: [facility] ORS;  Service: General;  Laterality: N/A;   EXCISIONAL HEMORRHOIDECTOMY  1980s   GIVENS CAPSULE STUDY N/A 12/08/2017   Procedure: GIVENS CAPSULE STUDY;  Surgeon: Wyline Mood, MD;  Location: [facility] ENDOSCOPY;  Service: Gastroenterology;  Laterality: N/A;   HEMORRHOID SURGERY N/A 10/29/2016   Procedure: HEMORRHOIDECTOMY;  Surgeon: Romie Levee, MD;  Location: [facility];   Service: General;  Laterality: N/A;   MITRAL VALVE ANNULOPLASTY  10/23/1998   "Model [id number]; Ronni Rumble [id number]"; size 32mm; [facility]; Dr. Elsie Ra   PACEMAKER IMPLANT N/A 07/28/2019   Procedure: PACEMAKER IMPLANT;  Surgeon: [name] Salvia, MD;  Location: [facility] INVASIVE CV LAB;  Service: Cardiovascular;  Laterality: N/A;   PILONIDAL CYST EXCISION  1954   RIGHT/LEFT HEART CATH AND CORONARY ANGIOGRAPHY Bilateral 09/21/2022   Procedure: RIGHT/LEFT HEART CATH AND CORONARY ANGIOGRAPHY;  Surgeon: Iran Ouch, MD;  Location: [facility] INVASIVE CV LAB;  Service: Cardiovascular;  Laterality: Bilateral;   TEE WITH CARDIOVERSION  05-06-2006 at [facility];  01-02-2013 at [facility]   TEE WITHOUT CARDIOVERSION N/A 06/19/2019   Procedure: TRANSESOPHAGEAL ECHOCARDIOGRAM (TEE);  Surgeon: Chrystie Nose, MD;  Location: [facility] ENDOSCOPY;  Service: Cardiovascular;  Laterality: N/A;   TOTAL HIP ARTHROPLASTY Left 05/04/2017   Procedure: LEFT TOTAL HIP ARTHROPLASTY ANTERIOR APPROACH;  Surgeon: Kathryne Hitch, MD;  Location: [facility] OR;  Service: Orthopedics;  Laterality: Left;   TRANSTHORACIC ECHOCARDIOGRAM  05-01-2015   dr Graciela Husbands   ef 50-55%/  mild AV sclerosis without stenosis/  post MV repair with mild central MR (valve area by pressure half-time 2cm^2,  valve area by continutity equation 0.91cm^2, peak grandiant 41mmHg)/  severe LAE/ mild TR/ mild RAE    TUBAL LIGATION Bilateral 1978    Current Outpatient Medications  Medication Instructions   Acetaminophen (TYLENOL PO) 1 tablet, Oral, As needed   albuterol (VENTOLIN HFA) 108 (90 Base) MCG/ACT inhaler 1-2 puffs, Inhalation, Every 6 hours PRN   bisoprolol (ZEBETA) 2.5 mg, Oral, Daily   cephALEXin (KEFLEX) 500 mg, Oral, 3 times daily   empagliflozin (JARDIANCE) 10 mg, Oral, Daily before breakfast   ferrous sulfate 325 (65 FE) MG EC tablet TAKE 1 TABLET EVERY DAY WITH BREAKFAST   fluticasone (FLONASE) 50 MCG/ACT nasal spray 1 spray, Each Nare, 2 times daily PRN   furosemide  (LASIX) 40 mg, Oral, Daily   hydrocortisone 1 % ointment 1 Application, Topical, 2 times daily, Apply to hands for itch as needed   levothyroxine (SYNTHROID) 75 MCG tablet TAKE 1 TABLET EVERY DAY BEFORE BREAKFAST   losartan (COZAAR) 25 mg, Oral, Daily at bedtime   mexiletine (MEXITIL) 200 mg, Oral, 2 times daily   potassium chloride SA (KLOR-CON M) 20 MEQ tablet 20 mEq, Oral, Daily   triamcinolone cream (KENALOG) 0.5 % 1 Application, Topical, 2 times daily PRN   VITAMIN D PO Oral, Daily   warfarin (COUMADIN) 5 MG tablet TAKE 1 TABLET DAILY EXCEPT TAKE 1/2 TABLET ON MONDAYS AND THURSDAYS OR AS DIRECTED BY COUMADIN CLINIC    Social History:  The patient  reports that she has never smoked. She has never used smokeless tobacco. She reports that she does not currently use alcohol. She reports that she does not use drugs.   Family History:  The patient's family history includes Alcohol abuse in her father; Cancer in her father; Lung cancer in her father; Sudden death in an other family member.  ROS:  Please see the history of present illness. All other systems are reviewed and otherwise negative.   PHYSICAL EXAM:  VS:  There were no vitals taken for this visit. BMI: There is no height or weight on file to calculate BMI.  GEN- The patient is ill-appearing, pale, diaphoretic. .   Lungs- Clear to ausculation bilaterally, normal work of breathing.  Heart- Regular rate and rhythm, no murmurs, rubs or gallops Extremities- Trace peripheral edema, warm, dry Skin- diaphoretic  device pocket well-healed, no tethering   Device interrogation not done d/t patient's condition  EKG is ordered. Personal review of EKG from today shows:       Recent Labs:  POC blood glucose - 115   03/02/2022: B Natriuretic Peptide 197.7 07/20/2022: TSH 3.161 12/16/2022: ALT 21; BUN 23; Creatinine, Ser 0.97; Hemoglobin 13.7; Platelets 174; Potassium 3.1; Sodium 140  03/12/2022: Cholesterol 194; HDL 34.80; LDL  Cholesterol 134; Total CHOL/HDL Ratio 6; Triglycerides 125.0; VLDL 25.0   Estimated Creatinine Clearance: 41.8 mL/min (by C-G formula based on SCr of 0.97 mg/dL).   Wt Readings from Last 3 Encounters:  12/16/22 171 lb (77.6 kg)  12/16/22 171 lb (77.6 kg)  12/11/22 175 lb (79.4 kg)     Additional studies reviewed include: Previous EP, cardiology notes.   R/LHC, 09/21/2022   3rd RPL lesion is 100% stenosed.   There is moderate to severe left ventricular systolic dysfunction.   LV end diastolic pressure is normal.   The left ventricular ejection  fraction is 25-35% by visual estimate.  1.  No significant coronary artery disease with the exception of an occluded small RPL branch with collaterals from the left circumflex. 2.  Moderately to severely reduced LV systolic function. 3.  Right heart catheterization showed high normal filling pressures, normal pulmonary pressure and normal cardiac output.  RA: 4 mmHg PW: 11/22 with a mean of 13 mmHg.  Prominent V wave suggestive of at least moderate mitral regurgitation. PA: 31/13 with a mean of 20 mmHg Cardiac output: 4.72 with an index of 2.55.  TTE, 08/18/2022  1. Left ventricular ejection fraction, by estimation, is 25 to 30%. The left ventricle has severely decreased function. The left ventricle demonstrates global hypokinesis. Left ventricular diastolic parameters are indeterminate.   2. Right ventricular systolic function is low normal. The right ventricular size is normal. There is moderately elevated pulmonary artery systolic pressure.   3. Left atrial size was severely dilated.   4. Right atrial size was moderately dilated.   5. The mitral valve has been repaired/replaced. Mild mitral valve regurgitation.   6. Tricuspid valve regurgitation is mild to moderate.   7. The aortic valve is tricuspid. Aortic valve regurgitation is not visualized. Aortic valve sclerosis/calcification is present, without any evidence of aortic stenosis.   8. The  inferior vena cava is dilated in size with <50% respiratory variability, suggesting right atrial pressure of 15 mmHg.   Comparison(s): 06/10/19: 55-60%, MV annuloplasty ring (10/1998), RVSP 37, LAE, mild MS, m/m MR, mass on PMVL.   ASSESSMENT AND PLAN:  #) perm AFib #) s/p AVN ablation #) PPM in situ #) PVC Pacemaker functioning well, see paceart for details Ventricular rates well-controlled   #) Hypercoag d/t perm AFib CHA2DS2-VASc Score = 6 [CHF History: 1, HTN History: 0, Diabetes History: 0, Stroke History: 2, Vascular Disease History: 0, Age Score: 2, Gender Score: 1].  Therefore, the patient's annual risk of stroke is 9.7 %. OAC - warfarin, mgmt at PCP office No bleeding concerns    #) HFrEF Recent echo shows reduced EF compared to prior NYHA II symptoms Warm and dry on exam GDMT: bisoprolol, losartan, jardiance - consider spiro at follow-up Diuretic: 40mg  lasix daily  -- update BMP today after recently starting jardiance   Current medicines are reviewed at length with the patient today.   The patient does not have concerns regarding her medicines.  The following changes were made today:   None  Labs/ tests ordered today include:  No orders of the defined types were placed in this encounter.    Disposition: Follow up with EP app in 1-2 weeks    Signed, Sherie Don, NP  12/19/22  2:31 PM  Electrophysiology [facility]

## 2022-12-21 ENCOUNTER — Encounter: Payer: Self-pay | Admitting: Cardiology

## 2022-12-21 ENCOUNTER — Ambulatory Visit: Payer: Medicare Other | Attending: Cardiology | Admitting: Cardiology

## 2022-12-21 DIAGNOSIS — I493 Ventricular premature depolarization: Secondary | ICD-10-CM

## 2022-12-21 DIAGNOSIS — Z9889 Other specified postprocedural states: Secondary | ICD-10-CM

## 2022-12-21 DIAGNOSIS — Z950 Presence of cardiac pacemaker: Secondary | ICD-10-CM

## 2022-12-21 DIAGNOSIS — I4821 Permanent atrial fibrillation: Secondary | ICD-10-CM

## 2022-12-21 DIAGNOSIS — I5020 Unspecified systolic (congestive) heart failure: Secondary | ICD-10-CM

## 2022-12-22 ENCOUNTER — Ambulatory Visit: Payer: Medicare Other | Admitting: Internal Medicine

## 2022-12-24 ENCOUNTER — Ambulatory Visit: Payer: Medicare Other

## 2022-12-24 DIAGNOSIS — Z7901 Long term (current) use of anticoagulants: Secondary | ICD-10-CM

## 2022-12-24 LAB — POCT INR: INR: 2 (ref 2.0–3.0)

## 2022-12-24 NOTE — Patient Instructions (Addendum)
Pre visit review using our clinic review tool, if applicable. No additional management support is needed unless otherwise documented below in the visit note.  Continue 1 tablet daily except take 1/2 tablet on Sundays and Wednesdays . Recheck in 4 weeks.

## 2022-12-24 NOTE — Progress Notes (Signed)
Continue 1 tablet daily except take 1/2 tablet on Sundays and Wednesdays . Recheck in 4 weeks.

## 2022-12-27 ENCOUNTER — Other Ambulatory Visit: Payer: Self-pay | Admitting: Family Medicine

## 2023-01-05 ENCOUNTER — Other Ambulatory Visit: Payer: Self-pay | Admitting: Family Medicine

## 2023-01-05 MED ORDER — LOSARTAN POTASSIUM 25 MG PO TABS: 25.0000 mg | ORAL_TABLET | Freq: Every day | ORAL | 1 refills | Status: DC

## 2023-01-05 NOTE — Telephone Encounter (Signed)
Looks like cardiology/ afib dpt filled med last, ? If PCP wants to refill med to mail order or have cardiology keep refilling med. Please advise

## 2023-01-05 NOTE — Telephone Encounter (Signed)
I sent it  

## 2023-01-05 NOTE — Telephone Encounter (Signed)
Prescription Request  01/05/2023  LOV: 07/03/2022  What is the name of the medication or equipment?  losartan (COZAAR) 25 MG tablet  Have you contacted your pharmacy to request a refill? No   Which pharmacy would you like this sent to?   [facility] - [place], [place] - [address] [place] [address] Phone: [phone] Fax: [phone]    Patient notified that their request is being sent to the clinical staff for review and that they should receive a response within 2 business days.   Please advise at Mobile [phone] (mobile)  Patient would like all future refills for this medication to be sent through selected pharmacy

## 2023-01-19 ENCOUNTER — Telehealth: Payer: Self-pay | Admitting: *Deleted

## 2023-01-19 NOTE — Telephone Encounter (Signed)
Transition Care Management Unsuccessful Follow-up Telephone Call  Date of discharge and from where:  [facility]  12/16/2022  Attempts:  1st Attempt  Reason for unsuccessful TCM follow-up call:  No answer/busy

## 2023-01-20 ENCOUNTER — Telehealth: Payer: Self-pay | Admitting: *Deleted

## 2023-01-20 NOTE — Telephone Encounter (Signed)
Transition Care Management Unsuccessful Follow-up Telephone Call Date of discharge and from where:  [facility]  12/18/2022  Attempts:  2nd Attempt  Reason for unsuccessful TCM follow-up call:  Declined to participate

## 2023-01-21 ENCOUNTER — Ambulatory Visit (INDEPENDENT_AMBULATORY_CARE_PROVIDER_SITE_OTHER): Payer: Medicare Other

## 2023-01-21 DIAGNOSIS — Z7901 Long term (current) use of anticoagulants: Secondary | ICD-10-CM

## 2023-01-21 LAB — POCT INR: INR: 1.4 — AB (ref 2.0–3.0)

## 2023-01-21 NOTE — Patient Instructions (Addendum)
Pre visit review using our clinic review tool, if applicable. No additional management support is needed unless otherwise documented below in the visit note.  Increase dose today and tomorrow to take 1 1/2 tablets and the change weekly dose to take 1 tablet daily except take 1/2 tablet on Wednesdays . Recheck in 2 weeks.

## 2023-01-21 NOTE — Progress Notes (Signed)
Pt denies any changes. Last 2 INR results were 2.0. Due to this and the subtherapeutic INR today there will be a change in weekly dosing also. Increase dose today and tomorrow to take 1 1/2 tablets and the change weekly dose to take 1 tablet daily except take 1/2 tablet on Wednesdays . Recheck in 2 weeks.

## 2023-01-22 ENCOUNTER — Ambulatory Visit (INDEPENDENT_AMBULATORY_CARE_PROVIDER_SITE_OTHER): Payer: Medicare Other

## 2023-01-22 DIAGNOSIS — I4430 Unspecified atrioventricular block: Secondary | ICD-10-CM

## 2023-01-22 LAB — CUP PACEART REMOTE DEVICE CHECK
Battery Remaining Longevity: 96 mo
Battery Voltage: 3.01 V
Brady Statistic AP VP Percent: 0 %
Brady Statistic AP VS Percent: 0 %
Brady Statistic AS VP Percent: 99.28 %
Brady Statistic AS VS Percent: 0.72 %
Brady Statistic RA Percent Paced: 0 %
Brady Statistic RV Percent Paced: 99.28 %
Date Time Interrogation Session: 20241031224030
Implantable Lead Connection Status: 753985
Implantable Lead Implant Date: 20210507
Implantable Lead Location: 753860
Implantable Lead Model: 5076
Implantable Pulse Generator Implant Date: 20210507
Lead Channel Impedance Value: 3306 Ohm
Lead Channel Impedance Value: 3306 Ohm
Lead Channel Impedance Value: 399 Ohm
Lead Channel Impedance Value: 437 Ohm
Lead Channel Pacing Threshold Amplitude: 0.75 V
Lead Channel Pacing Threshold Pulse Width: 0.4 ms
Lead Channel Sensing Intrinsic Amplitude: 1.875 mV
Lead Channel Sensing Intrinsic Amplitude: 12.75 mV
Lead Channel Sensing Intrinsic Amplitude: 8.625 mV
Lead Channel Setting Pacing Amplitude: 2.5 V
Lead Channel Setting Pacing Pulse Width: 0.4 ms
Lead Channel Setting Sensing Sensitivity: 4 mV
Zone Setting Status: 755011

## 2023-01-26 ENCOUNTER — Encounter: Payer: Self-pay | Admitting: Internal Medicine

## 2023-01-26 ENCOUNTER — Ambulatory Visit: Payer: Medicare Other | Attending: Internal Medicine | Admitting: Internal Medicine

## 2023-01-26 VITALS — BP 115/55 | HR 72 | Ht 64.0 in | Wt 173.2 lb

## 2023-01-26 DIAGNOSIS — Z950 Presence of cardiac pacemaker: Secondary | ICD-10-CM | POA: Diagnosis not present

## 2023-01-26 DIAGNOSIS — I4821 Permanent atrial fibrillation: Secondary | ICD-10-CM | POA: Insufficient documentation

## 2023-01-26 MED ORDER — SPIRONOLACTONE 25 MG PO TABS: 12.5000 mg | ORAL_TABLET | Freq: Every day | ORAL | 3 refills | Status: DC

## 2023-01-26 NOTE — Patient Instructions (Addendum)
Medication Instructions:  START Spirolactone 12.5 mg daily   *If you need a refill on your cardiac medications before your next appointment, please call your pharmacy*  Labs: Your provider would like for you to have following labs drawn today BMET.     Testing/Procedures: Your physician has requested that you have an echocardiogram (in 4 weeks). Echocardiography is a painless test that uses sound waves to create images of your heart. It provides your doctor with information about the size and shape of your heart and how well your heart's chambers and valves are working.   You may receive an ultrasound enhancing agent through an IV if needed to better visualize your heart during the echo. This procedure takes approximately one hour.  There are no restrictions for this procedure.  This will take place at [address] [facility]) [address], [place] [address]  Please note: We ask at that you not bring children with you during ultrasound (echo/ vascular) testing. Due to room size and safety concerns, children are not allowed in the ultrasound rooms during exams. Our front office staff cannot provide observation of children in our lobby area while testing is being conducted. An adult accompanying a patient to their appointment will only be allowed in the ultrasound room at the discretion of the ultrasound technician under special circumstances. We apologize for any inconvenience.    Follow-Up: At [facility], you and your health needs are our priority.  As part of our continuing mission to provide you with exceptional heart care, we have created designated Provider Care Teams.  These Care Teams include your primary Cardiologist (physician) and Advanced Practice Providers (APPs -  Physician Assistants and Nurse Practitioners) who all work together to provide you with the care you need, when you need it.  We recommend signing up for the patient portal called "MyChart".  Sign  up information is provided on this After Visit Summary.  MyChart is used to connect with patients for Virtual Visits (Telemedicine).  Patients are able to view lab/test results, encounter notes, upcoming appointments, etc.  Non-urgent messages can be sent to your provider as well.   To learn more about what you can do with MyChart, go to [email or web address].    Your next appointment:   6 week(s)  Provider:   Sherryl Manges, MD

## 2023-01-26 NOTE — Progress Notes (Unsigned)
Electrophysiology Office Note   Date:  01/26/2023   ID:  [name], DOB [birth year], MRN [id number]  PCP:  [name] Pimple, MD  Cardiologist:   Primary Electrophysiologist:  [name] Manges, MD    Chief Complaint  Patient presents with   Follow-up    6 month follow up,medication reviewed verbally with patient      History of Present Illness: [name] is a 89 y.o. female seen for atrial fibrillation.  Now permanent.  Anticoagulation with warfarin.  And with AV junction ablation and pacing 5/21 with interval deterioration of LV systolic function concerned either secondary to ventricular pacing or because of PVCs.  At the last visit, mexiletine was initiated as she has an intolerance to amiodarone associated with hypothyroidism" swelling    Stroke 2/21.      Reviewing her chart amiodarone is listed as an intolerance dating back to 03/02/2011 a"  She continues to struggle with fatigue and exercise intolerance manifested by leg weakness and dyspnea.  Increasingly reclusive isolated.  Has changed churches.  Her   son, [name], March 01, 2021  She found him dead in the home.    DATE TEST EF   10/14 Echo   55-60 %   2/17 Echo   50-55 %   9/18 Echo  55-65%   3/21 TEE 55-65%   5/24 Echo  25-30%   7/24 TLHC/RHC  Nonobst CAD x small CX     Date Cr TSH Hgb  6/18    13.9  2/19 0.74  9.8  1/20 0.83 4.06(11/19) 12.6  6/20 0.84 5.04 12.1  8/20  3.74   5/21 0.84 3.81 12.8  8/22 0.65 5.131 13.2  10/23 0.75 3.89 13.0  6/24 0.82 4.0 11.6<<13,4  9/24 0.97 3.1 yeah place 13.7    Past Medical History:  Diagnosis Date   Allergic rhinitis    Alopecia 2/2 beta blockers    Anemia    Arthritis    Atrial fibrillation -persistent cardiologist-  dr [name]/  primary EP -- dr [name] ([name])   a. s/p PVI [name] Mar 01, 2009;  b. on tikosyn/coumadin;  c. 05/2009 Echo: EF 60-65%, Gr 2 DD. 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 (annuloplasty ring procedure)   S/P placement of cardiac pacemaker MDT 07/28/19 07/29/2019   Past Surgical History:  Procedure Laterality Date   APPENDECTOMY  1978   AV NODE ABLATION N/A 07/28/2019   Procedure: AV NODE ABLATION;  Surgeon: [name] Salvia, MD;  Location: [facility] INVASIVE CV LAB;  Service: Cardiovascular;  Laterality: N/A;   BUBBLE STUDY  06/19/2019   Procedure: BUBBLE STUDY;  Surgeon: [name] Nose, MD;  Location: [facility] ENDOSCOPY;  Service: Cardiovascular;;   CARDIAC ELECTROPHYSIOLOGY MAPPING AND ABLATION  01/ 2008    at [facility] [place]   right-sided ablation atrial flutter   CARDIAC ELECTROPHYSIOLOGY STUDY AND ABLATION  03/ 2010   dr [name] at [facility]   AV node ablation and pulmonary vein isolation for atrial fib   CARDIOVERSION  06-18-2006;  07-13-2006;  10-19-2010;  10-27-2010   CATARACT EXTRACTION W/PHACO Right 04/09/2021   Procedure: CATARACT EXTRACTION PHACO AND INTRAOCULAR LENS  PLACEMENT (IOC) RIGHT 6.71 01:11.1;  Surgeon: [name] Mola, MD;  Location: [facility] SURGERY CNTR;  Service: Ophthalmology;  Laterality: Right;   CATARACT EXTRACTION W/PHACO Left 04/23/2021   Procedure: CATARACT EXTRACTION PHACO AND INTRAOCULAR LENS PLACEMENT (IOC) LEFT;  Surgeon: [name] Mola, MD;  Location: [facility] SURGERY CNTR;  Service: Ophthalmology;  Laterality: Left;  [place] 5.65 00:50.1   COLONOSCOPY     COLONOSCOPY WITH PROPOFOL N/A 10/13/2017   Procedure: COLONOSCOPY WITH PROPOFOL;  Surgeon: [name] Mood, MD;  Location: [facility] ENDOSCOPY;  Service: Gastroenterology;  Laterality: N/A;   COLONOSCOPY WITH PROPOFOL N/A 02/20/2020   Procedure: COLONOSCOPY WITH PROPOFOL;  Surgeon: [name] Bill, MD;  Location: [facility] ENDOSCOPY;  Service: Endoscopy;  Laterality: N/A;   CYSTO/ TRANSURETHRAL COLLAGEN INJECTION THERAPY  07-26-2007   dr [name]   DILATION AND CURETTAGE OF UTERUS     ESOPHAGOGASTRODUODENOSCOPY (EGD) WITH PROPOFOL N/A 10/13/2017   Procedure: ESOPHAGOGASTRODUODENOSCOPY (EGD) WITH PROPOFOL;  Surgeon: [name] Mood, MD;  Location: [facility] ENDOSCOPY;  Service: Gastroenterology;  Laterality: N/A;   EVALUATION UNDER ANESTHESIA WITH HEMORRHOIDECTOMY N/A 04/09/2020   Procedure: EXAM UNDER ANESTHESIA WITH HEMORRHOIDECTOMY;  Surgeon: Leafy Ro, MD;  Location: [facility] ORS;  Service: General;  Laterality: N/A;   EXCISIONAL HEMORRHOIDECTOMY  1980s   GIVENS CAPSULE STUDY N/A 12/08/2017   Procedure: GIVENS CAPSULE STUDY;  Surgeon: [name] Mood, MD;  Location: [facility] ENDOSCOPY;  Service: Gastroenterology;  Laterality: N/A;   HEMORRHOID SURGERY N/A 10/29/2016   Procedure: HEMORRHOIDECTOMY;  Surgeon: Romie Levee, MD;  Location: [facility];  Service: General;  Laterality: N/A;   MITRAL VALVE ANNULOPLASTY  10/23/1998   "Model [id number]; Ronni Rumble [id number]"; size 32mm; [facility]; Dr. Elsie Key   PACEMAKER IMPLANT N/A 07/28/2019   Procedure: PACEMAKER IMPLANT;  Surgeon: [name] Salvia, MD;  Location: [facility] INVASIVE CV LAB;  Service: Cardiovascular;  Laterality: N/A;   PILONIDAL CYST EXCISION  1954   RIGHT/LEFT HEART CATH AND CORONARY ANGIOGRAPHY Bilateral 09/21/2022   Procedure: RIGHT/LEFT HEART CATH AND CORONARY ANGIOGRAPHY;  Surgeon: Iran Ouch, MD;  Location: [facility] INVASIVE CV LAB;  Service: Cardiovascular;  Laterality: Bilateral;   TEE WITH CARDIOVERSION  05-06-2006 at [facility];  01-02-2013 at [facility]   TEE WITHOUT CARDIOVERSION N/A 06/19/2019   Procedure: TRANSESOPHAGEAL ECHOCARDIOGRAM (TEE);  Surgeon: [name] Nose, MD;  Location: [facility] ENDOSCOPY;  Service: Cardiovascular;  Laterality: N/A;   TOTAL HIP ARTHROPLASTY Left 05/04/2017   Procedure: LEFT TOTAL HIP ARTHROPLASTY ANTERIOR APPROACH;  Surgeon: Kathryne Hitch, MD;  Location: [facility] OR;  Service: Orthopedics;  Laterality: Left;  TRANSTHORACIC ECHOCARDIOGRAM  05-01-2015   dr Graciela Husbands   ef 50-55%/  mild AV sclerosis without stenosis/  post MV repair with mild central MR (valve area by pressure half-time 2cm^2,  valve area by continutity equation 0.91cm^2, peak grandiant 42mmHg)/  severe LAE/ mild TR/ mild RAE    TUBAL LIGATION Bilateral 1978     Current Outpatient Medications  Medication Sig Dispense Refill   Acetaminophen (TYLENOL PO) Take 1 tablet by mouth as needed.     albuterol (VENTOLIN HFA) 108 (90 Base) MCG/ACT inhaler Inhale 1-2 puffs into the lungs every 6 (six) hours as needed for wheezing or shortness of breath. 18 g 2   bisoprolol (ZEBETA) 5 MG tablet Take 0.5 tablets (2.5 mg total) by mouth daily. 30 tablet 2   empagliflozin (JARDIANCE) 10 MG TABS tablet Take 1 tablet (10 mg total) by mouth daily before breakfast. 30 tablet 6   ferrous sulfate 325 (65 FE) MG EC tablet TAKE 1 TABLET EVERY DAY WITH BREAKFAST 90 tablet 2   fluticasone (FLONASE) 50 MCG/ACT nasal spray Place 1 spray into both nostrils 2 (two) times daily as needed for allergies or rhinitis. 16 g 11   furosemide (LASIX) 40 MG tablet Take 1  tablet (40 mg total) by mouth daily. 90 tablet 3   levothyroxine (SYNTHROID) 75 MCG tablet TAKE 1 TABLET EVERY DAY BEFORE BREAKFAST 90 tablet 1   losartan (COZAAR) 25 MG tablet Take 1 tablet (25 mg total) by mouth at bedtime. 90 tablet 1   mexiletine (MEXITIL) 200 MG capsule Take 1 capsule (200 mg total) by mouth 2 (two) times daily. 180 capsule 3   potassium chloride SA (KLOR-CON M) 20 MEQ tablet Take 1 tablet (20 mEq total) by mouth daily for 3 days. 3 tablet 0   VITAMIN D PO Take by mouth daily.     warfarin (COUMADIN) 5 MG tablet TAKE 1 TABLET DAILY EXCEPT TAKE 1/2 TABLET ON MONDAYS AND THURSDAYS OR AS DIRECTED BY COUMADIN CLINIC 105 tablet 1   No current facility-administered medications for this visit.    Allergies:   Amiodarone, Penicillins, Metoprolol, and Statins  Were evaluated  ROS:  Please see the history of present illness. All other systems are reviewed and negative.    PHYSICAL EXAM: VS:  BP (!) 115/55 (BP Location: Left Arm, Patient Position: Sitting, Cuff Size: Normal)   Pulse 72   Ht 5\' 4"  (1.626 m)   Wt 173 lb 3.2 oz (78.6 kg)   SpO2 97%   BMI 29.73 kg/m  , BMI Body mass index is 29.73 kg/m. Well developed and well nourished in no acute distress HENT normal Neck supple with JVP-flat Clear Device pocket well healed; without hematoma or erythema.  There is no tethering  Regular rate and rhythm, no  gallop No  murmur Abd-soft with active BS No Clubbing cyanosis tr edema Skin-warm and dry A & Oriented  Grossly normal sensory and motor function  ECG atrial fibrillation with ventricular pacing without PVCs  Device function is normal. Programming changes none  See Paceart for details    Device function is normal. Programming changes none  See Paceart for details     ASSESSMENT AND PLAN: Atrial fibrillation-permanent  AV junction ablation  Pacemaker-[employer]  CArdiomyopathy   PVCs greater than 10% as suggested by ventricular pacing  percentage  Hypothyroidism-treated     HFrEF  Obesity    Hypokalemia  \Social isolation   PVCs look to be largely extinguished by the mexiletine.  Now  greater than 98% ventricular pacing.  The question now will be is whether the cardiomyopathy has improved or not, based on her symptoms I suspect that the answer is not.  We will plan to get a repeat assessment in about 4 weeks and then decide at that juncture as to proceeding with CRT upgrade.  With her cardiomyopathy, we will also add spironolactone, appropriately indicated in the context of her hypokalemia and her cardiomyopathy.  Will check a metabolic profile in about 3 weeks at the time of her echocardiogram  Encouraged her to be more intentional with her social engagements and consider chair yoga   [name] Manges, MD  01/26/2023 9:46 AM     [facility] [address] [name] [address] [phone] (office) [phone] (fax)

## 2023-01-27 LAB — BASIC METABOLIC PANEL
BUN/Creatinine Ratio: 19 (ref 12–28)
BUN: 19 mg/dL (ref 8–27)
CO2: 28 mmol/L (ref 20–29)
Calcium: 9 mg/dL (ref 8.7–10.3)
Chloride: 101 mmol/L (ref 96–106)
Creatinine, Ser: 1 mg/dL (ref 0.57–1.00)
Glucose: 98 mg/dL (ref 70–99)
Potassium: 4 mmol/L (ref 3.5–5.2)
Sodium: 144 mmol/L (ref 134–144)
eGFR: 54 mL/min/{1.73_m2} — ABNORMAL LOW (ref >59)

## 2023-01-27 LAB — CUP PACEART INCLINIC DEVICE CHECK
Date Time Interrogation Session: 20241105210012
Implantable Lead Connection Status: 753985
Implantable Lead Implant Date: 20210507
Implantable Lead Location: 753860
Implantable Lead Model: 5076
Implantable Pulse Generator Implant Date: 20210507

## 2023-02-03 NOTE — Progress Notes (Signed)
Remote pacemaker transmission.   

## 2023-02-04 ENCOUNTER — Ambulatory Visit: Payer: Medicare Other

## 2023-02-04 DIAGNOSIS — Z7901 Long term (current) use of anticoagulants: Secondary | ICD-10-CM

## 2023-02-04 LAB — POCT INR: INR: 3.2 — AB (ref 2.0–3.0)

## 2023-02-04 NOTE — Patient Instructions (Addendum)
Pre visit review using our clinic review tool, if applicable. No additional management support is needed unless otherwise documented below in the visit note.  Reduce dose tomorrow to take 1/2 tablet and the continue 1 tablet daily except take 1/2 tablet on Wednesdays . Recheck in 3 weeks.

## 2023-02-04 NOTE — Progress Notes (Signed)
Reduce dose tomorrow to take 1/2 tablet and the continue 1 tablet daily except take 1/2 tablet on Wednesdays . Recheck in 3 weeks.

## 2023-02-22 ENCOUNTER — Encounter: Payer: Self-pay | Admitting: Oncology

## 2023-02-23 ENCOUNTER — Ambulatory Visit: Payer: Medicare Other | Attending: Internal Medicine

## 2023-02-23 ENCOUNTER — Other Ambulatory Visit: Payer: Self-pay | Admitting: Family Medicine

## 2023-02-23 DIAGNOSIS — I4821 Permanent atrial fibrillation: Secondary | ICD-10-CM

## 2023-02-23 LAB — ECHOCARDIOGRAM COMPLETE
Area-P 1/2: 2.73 cm2
S' Lateral: 3.5 cm

## 2023-02-25 ENCOUNTER — Ambulatory Visit: Payer: Medicare Other

## 2023-02-25 DIAGNOSIS — Z7901 Long term (current) use of anticoagulants: Secondary | ICD-10-CM | POA: Diagnosis not present

## 2023-02-25 LAB — POCT INR: INR: 2.6 (ref 2.0–3.0)

## 2023-02-25 NOTE — Progress Notes (Signed)
Continue 1 tablet daily except take 1/2 tablet on Wednesdays. Recheck in 4 weeks.

## 2023-02-25 NOTE — Patient Instructions (Addendum)
Pre visit review using our clinic review tool, if applicable. No additional management support is needed unless otherwise documented below in the visit note.  Continue 1 tablet daily except take 1/2 tablet on Wednesdays. Recheck in 4 weeks.  

## 2023-03-08 ENCOUNTER — Ambulatory Visit: Payer: Medicare Other | Attending: Internal Medicine | Admitting: Internal Medicine

## 2023-03-08 ENCOUNTER — Ambulatory Visit: Payer: Medicare Other | Attending: Cardiology | Admitting: Cardiology

## 2023-03-08 VITALS — BP 119/51 | HR 79 | Ht 65.0 in | Wt 174.0 lb

## 2023-03-08 DIAGNOSIS — I447 Left bundle-branch block, unspecified: Secondary | ICD-10-CM | POA: Diagnosis not present

## 2023-03-08 DIAGNOSIS — I482 Chronic atrial fibrillation, unspecified: Secondary | ICD-10-CM | POA: Diagnosis not present

## 2023-03-08 DIAGNOSIS — I5020 Unspecified systolic (congestive) heart failure: Secondary | ICD-10-CM | POA: Diagnosis not present

## 2023-03-08 DIAGNOSIS — I429 Cardiomyopathy, unspecified: Secondary | ICD-10-CM | POA: Insufficient documentation

## 2023-03-08 MED ORDER — BISOPROLOL FUMARATE 5 MG PO TABS: 5.0000 mg | ORAL_TABLET | Freq: Every day | ORAL | 11 refills | Status: AC

## 2023-03-08 NOTE — Progress Notes (Signed)
ADVANCED HEART FAILURE CLINIC NOTE  Referring Physician: Tower, Audrie Gallus, MD  Primary Care: Tower, Audrie Gallus, MD Primary Cardiologist: Dr. Graciela Husbands  HPI: [name] is a 89 y.o. female with permanent atrial fibrillation,AFL s/p ablation ([name]) heart failure with recovered ejection fraction, mitral regurgitation status post MVR, history of AV node ablation with permanent pacemaker implant, history of stroke presenting today to establish care. Her cardiac history dates back to at least 2001 when she underwent mitral valve repair with annuloplasty ring at [facility]. She did well for several years until she was diagnosed with atrial fibrillation resistent to anti-arrythmics.  Her atrial fibrillation was believed to be secondary to atriotomy from AFL ablation.  She eventually underwent AV nodal ablation with placement of single-chamber permanent pacemaker.  Once more she did fairly well with improvement in functional status until the past several weeks to months when she has had a steady decline.  She had an echocardiogram in May 2024 due to chest pain and shortness of breath.  Echo was significant for drop in EF to 25 to 30%.  Follow-up heart catheterization with CTO of the small PLV branch vessel but otherwise no significant CAD.  - Since addition of GDMT, she has had improvement in LV function from 25% to 45%. From a functional standpoint, she feels relatively the same. She can walk around 50-149ft before needing to stop due to weakness. She has no shortness of breath, PND or LE edema. Compliant with all medications.   Activity level/exercise tolerance:  NYHA IIB Orthopnea:  Sleeps on 1 pillows Paroxysmal noctural dyspnea:  No Chest pain/pressure:  No Orthostatic lightheadedness:  Very infrequent Palpitations:  No Lower extremity edema:  no Presyncope/syncope:  No Cough:  No  Past Medical History:  Diagnosis Date   Allergic rhinitis    Alopecia 2/2 beta blockers    Anemia    Arthritis     Atrial fibrillation -persistent cardiologist-  dr klein/  primary EP -- dr Julian Hy ([facility])   a. s/p PVI [name] 2010;  b. on tikosyn/coumadin;  c. 05/2009 Echo: EF 60-65%, Gr 2 DD. (first dx 09/ 2007)   Bilateral lower extremity edema    Bleeding hemorrhoid    Carotid stenosis    mild (hosp 3/11)- consult by vasc/ Dr Arbie Cookey   Complication of anesthesia    hard to wake   Diverticulosis of colon    Dyspnea    on exertion-climbing stairs   Fatty liver    H/O cardiac radiofrequency ablation    01/ 2008 at [place] [facility] [place] /  03/ 2010  at [facility] failure with preserved ejection fraction [facility])    History of adenomatous polyp of colon    tubular adenoma's   History of cardiomyopathy    secondary tachycardia-induced cardiomyopathy -- resolved 2014   History of squamous cell carcinoma in situ (SCCIS) of skin    05/ 2017  nasal bridge and right medial knee   History of transient ischemic attack (TIA)    01-24-2005 and 06-12-2009   Hyperlipidemia    Hypothyroidism    Mild intermittent asthma    reacts to cats   Mixed stress and urge urinary incontinence    Presence of permanent cardiac pacemaker    was put in 07/2019   Pulmonary nodule    S/P AV nodal ablation 07/28/19 07/29/2019   S/P mitral valve repair 10-23-1998  dr Elsie Ra at [facility]   for MVP and regurg. (annuloplasty ring procedure)  S/P placement of cardiac pacemaker MDT 07/28/19 07/29/2019    Current Outpatient Medications  Medication Sig Dispense Refill   Acetaminophen (TYLENOL PO) Take 1 tablet by mouth as needed.     albuterol (VENTOLIN HFA) 108 (90 Base) MCG/ACT inhaler Inhale 1-2 puffs into the lungs every 6 (six) hours as needed for wheezing or shortness of breath. 18 g 2   bisoprolol (ZEBETA) 5 MG tablet Take 0.5 tablets (2.5 mg total) by mouth daily. 30 tablet 2   empagliflozin (JARDIANCE) 10 MG TABS tablet Take 1 tablet (10 mg total) by mouth daily before breakfast. 30 tablet 6   ferrous sulfate  325 (65 FE) MG EC tablet TAKE 1 TABLET EVERY DAY WITH BREAKFAST 90 tablet 0   fluticasone (FLONASE) 50 MCG/ACT nasal spray Place 1 spray into both nostrils 2 (two) times daily as needed for allergies or rhinitis. 16 g 11   levothyroxine (SYNTHROID) 75 MCG tablet TAKE 1 TABLET EVERY DAY BEFORE BREAKFAST 90 tablet 1   losartan (COZAAR) 25 MG tablet Take 1 tablet (25 mg total) by mouth at bedtime. 90 tablet 1   mexiletine (MEXITIL) 200 MG capsule Take 1 capsule (200 mg total) by mouth 2 (two) times daily. 180 capsule 3   spironolactone (ALDACTONE) 25 MG tablet Take 0.5 tablets (12.5 mg total) by mouth daily. 45 tablet 3   VITAMIN D PO Take by mouth daily.     warfarin (COUMADIN) 5 MG tablet TAKE 1 TABLET DAILY EXCEPT TAKE 1/2 TABLET ON MONDAYS AND THURSDAYS OR AS DIRECTED BY COUMADIN CLINIC 105 tablet 1   furosemide (LASIX) 40 MG tablet Take 1 tablet (40 mg total) by mouth daily. 90 tablet 3   potassium chloride SA (KLOR-CON M) 20 MEQ tablet Take 1 tablet (20 mEq total) by mouth daily for 3 days. 3 tablet 0   No current facility-administered medications for this visit.    Allergies  Allergen Reactions   Amiodarone Swelling    SWELLING REACTION UNSPECIFIED    Penicillins Hives and Rash    Has patient had a PCN reaction causing immediate rash, facial/tongue/throat swelling, SOB or lightheadedness with hypotension: No Has patient had a PCN reaction causing severe rash involving mucus membranes or skin necrosis: No Has patient had a PCN reaction that required hospitalization:Patient was inpatient when reaction occurred Has patient had a PCN reaction occurring within the last 10 years: No If all of the above answers are "NO", then may proceed with Cephalosporin use   Metoprolol Other (See Comments)    Hair loss   Statins Rash    REACTION: rash      Social History   Socioeconomic History   Marital status: Widowed    Spouse name: Not on file   Number of children: 6   Years of education:  Not on file   Highest education level: Not on file  Occupational History   Occupation: realtor    Employer: RETIRED  Tobacco Use   Smoking status: Never   Smokeless tobacco: Never  Vaping Use   Vaping status: Never Used  Substance and Sexual Activity   Alcohol use: Not Currently    Comment: seldom   Drug use: No   Sexual activity: Not Currently  Other Topics Concern   Not on file  Social History Narrative   Retired. Daily Caffeine use: 2 daily    Social Drivers of Health   Financial Resource Strain: Low Risk  (03/18/2022)   Overall Financial Resource Strain (CARDIA)    Difficulty  of Paying Living Expenses: Not hard at all  Food Insecurity: No Food Insecurity (03/18/2022)   Hunger Vital Sign    Worried About Running Out of Food in the Last Year: Never true    Ran Out of Food in the Last Year: Never true  Transportation Needs: No Transportation Needs (03/18/2022)   PRAPARE - Administrator, Civil Service (Medical): No    Lack of Transportation (Non-Medical): No  Physical Activity: Inactive (03/10/2021)   Exercise Vital Sign    Days of Exercise per Week: 0 days    Minutes of Exercise per Session: 0 min  Stress: No Stress Concern Present (03/18/2022)   [employer] of Occupational Health - Occupational Stress Questionnaire    Feeling of Stress : Only a little  Social Connections: Moderately Integrated (03/18/2022)   Social Connection and Isolation Panel [NHANES]    Frequency of Communication with Friends and Family: More than three times a week    Frequency of Social Gatherings with Friends and Family: Three times a week    Attends Religious Services: More than 4 times per year    Active Member of Clubs or Organizations: Yes    Attends Banker Meetings: More than 4 times per year    Marital Status: Widowed  Intimate Partner Violence: Not At Risk (03/18/2022)   Humiliation, Afraid, Rape, and Kick questionnaire    Fear of Current or Ex-Partner:  No    Emotionally Abused: No    Physically Abused: No    Sexually Abused: No      Family History  Problem Relation Age of Onset   Lung cancer Father        smoker, died at 96   Alcohol abuse Father    Cancer Father        bladder and lung CA smoker   Sudden death Other    Breast cancer Neg Hx    Stroke Neg Hx     PHYSICAL EXAM: Vitals:   03/08/23 0956  BP: (!) 119/51  Pulse: 79  SpO2: 95%   GENERAL: Well nourished, well developed, and in no apparent distress at rest.  HEENT: Negative for arcus senilis or xanthelasma. There is no scleral icterus.  The mucous membranes are pink and moist.   NECK: Supple, No masses. Normal carotid upstrokes without bruits. No masses or thyromegaly.    CHEST: There are no chest wall deformities. There is no chest wall tenderness. Respirations are unlabored.  Lungs- CTA B/L CARDIAC:  JVP: 7 cm          Normal rate with regular rhythm. No murmurs, rubs or gallops.  Pulses are 2+ and symmetrical in upper and lower extremities. No edema.  ABDOMEN: Soft, non-tender, non-distended. There are no masses or hepatomegaly. There are normal bowel sounds.  EXTREMITIES: Warm and well perfused with no cyanosis, clubbing.  LYMPHATIC: No axillary or supraclavicular lymphadenopathy.  NEUROLOGIC: Patient is oriented x3 with no focal or lateralizing neurologic deficits.  PSYCH: Patients affect is appropriate, there is no evidence of anxiety or depression.  SKIN: Warm and dry; no lesions or wounds.    DATA REVIEW  ECG: 10/08/22: atrial fibrillation with RV pacing  As per my personal interpretation  ECHO: 08/18/22: LVEF 35% with significant dyssynchrony due to RV pacing As per my personal interpretation  CATH: 09/21/22:  3rd RPL lesion is 100% stenosed.   There is moderate to severe left ventricular systolic dysfunction.   LV end diastolic pressure is normal.  The left ventricular ejection fraction is 25-35% by visual estimate.   1.  No significant  coronary artery disease with the exception of an occluded small RPL branch with collaterals from the left circumflex. 2.  Moderately to severely reduced LV systolic function. 3.  Right heart catheterization showed high normal filling pressures, normal pulmonary pressure and normal cardiac output.   RA: 4 mmHg PW: 11/22 with a mean of 13 mmHg.  Prominent V wave suggestive of at least moderate mitral regurgitation. PA: 31/13 with a mean of 20 mmHg Cardiac output: 4.72 with an index of 2.55.   ASSESSMENT & PLAN:  Heart failure with reduced ejection fraction Etiology of HF:HF likely secondary to chronic RV pacing; most recent device interrogation from 09/16/22 with >90% RV pacing consistent with history of AVN ablation.  NYHA class / AHA Stage:IIB. Becomes fatigued due to deconditioning.  Volume status & Diuretics: Euvolemic Vasodilators: continue losartan 25mg  daily Beta-Blocker: increase bisoprolol 5mg   ZOX:WRUEA spironolactone at follow up.  Cardiometabolic:start farxiga 10mg  Devices therapies & Valvulopathies:Permanent atrial fibrillation s/p AVN with single chamber PPM. She is 90% RV paced, however, has had improvement in her LVEF to 40-45% with low dose GDMT and control of ectopy. Will defer upgrade to CRT for the time being.  Advanced therapies:Not indicated.   2.  Permanent atrial fibrillation -Previously on rhythm control with Tikosyn however discontinued due to progression to permanent AF. -AV node ablation in May 2021 with permanent pacemaker placement -History of stroke due to subtherapeutic INR. - appears to be in afib today.   3. MR s/p mitral annuloplasty - Details not available; TEE from 2021 with prosthetic annuloplasty ring from 10/23/1998.   I spent 38 minutes caring for this patient today including face to face time, ordering and reviewing labs, reviewing records from Dr. Graciela Husbands (01/26/23), seeing the patient, discussing risk and benefits of CRT upgrade while reviewing her  echocardiogram  documenting in the record, and arranging follow ups.   Bridger Pizzi Advanced Heart Failure Mechanical Circulatory Support

## 2023-03-08 NOTE — Addendum Note (Signed)
Addended by: Jola Schmidt A on: 03/08/2023 10:45 AM   Modules accepted: Orders

## 2023-03-08 NOTE — Patient Instructions (Addendum)
INCREASE YOUR BISPROLOL TO 5 MG ONCE DAILY  OUR PROVIDERS' SCHEDULES ARE NOT OPEN YET FOR 4 MONTHS OUT. WE WILL PLACE YOU ON A RECALL LIST AND CALL YOU CLOSER TO THAT TIME FOR SCHEDULING.

## 2023-03-22 ENCOUNTER — Ambulatory Visit (INDEPENDENT_AMBULATORY_CARE_PROVIDER_SITE_OTHER): Payer: Medicare Other

## 2023-03-22 ENCOUNTER — Ambulatory Visit: Payer: Medicare Other | Admitting: Internal Medicine

## 2023-03-22 VITALS — Ht 65.0 in | Wt 174.0 lb

## 2023-03-22 DIAGNOSIS — Z0000 Encounter for general adult medical examination without abnormal findings: Secondary | ICD-10-CM | POA: Diagnosis not present

## 2023-03-22 NOTE — Patient Instructions (Signed)
[name] , Thank you for taking time to come for your Medicare Wellness Visit. I appreciate your ongoing commitment to your health goals. Please review the following plan we discussed and let me know if I can assist you in the future.   Referrals/Orders/Follow-Ups/Clinician Recommendations: none  This is a list of the screening recommended for you and due dates:  Health Maintenance  Topic Date Due   Mammogram  04/24/2020   Zoster (Shingles) Vaccine (2 of 2) 05/19/2022   Flu Shot  06/21/2023*   COVID-19 Vaccine (1) 12/06/2024*   Medicare Annual Wellness Visit  03/21/2024   DTaP/Tdap/Td vaccine (3 - Td or Tdap) 10/01/2030   Pneumonia Vaccine  Completed   DEXA scan (bone density measurement)  Completed   HPV Vaccine  Aged Out  *Topic was postponed. The date shown is not the original due date.    Advanced directives: (Copy Requested) Please bring a copy of your health care power of attorney and living will to the office to be added to your chart at your convenience.  Next Medicare Annual Wellness Visit scheduled for next year: Yes 03/22/2024 @ 8:10am televisit

## 2023-03-25 ENCOUNTER — Ambulatory Visit: Payer: Medicare Other

## 2023-03-25 ENCOUNTER — Encounter: Payer: Self-pay | Admitting: Family Medicine

## 2023-03-25 ENCOUNTER — Ambulatory Visit: Payer: Medicare Other | Admitting: Family Medicine

## 2023-03-25 VITALS — BP 134/82 | HR 88 | Temp 97.8°F | Ht 65.0 in | Wt 173.4 lb

## 2023-03-25 DIAGNOSIS — R7309 Other abnormal glucose: Secondary | ICD-10-CM

## 2023-03-25 DIAGNOSIS — F418 Other specified anxiety disorders: Secondary | ICD-10-CM | POA: Diagnosis not present

## 2023-03-25 DIAGNOSIS — M8589 Other specified disorders of bone density and structure, multiple sites: Secondary | ICD-10-CM

## 2023-03-25 DIAGNOSIS — Z5181 Encounter for therapeutic drug level monitoring: Secondary | ICD-10-CM | POA: Diagnosis not present

## 2023-03-25 DIAGNOSIS — D500 Iron deficiency anemia secondary to blood loss (chronic): Secondary | ICD-10-CM

## 2023-03-25 DIAGNOSIS — Z7901 Long term (current) use of anticoagulants: Secondary | ICD-10-CM

## 2023-03-25 DIAGNOSIS — R053 Chronic cough: Secondary | ICD-10-CM | POA: Diagnosis not present

## 2023-03-25 DIAGNOSIS — I5020 Unspecified systolic (congestive) heart failure: Secondary | ICD-10-CM | POA: Diagnosis not present

## 2023-03-25 DIAGNOSIS — Z95811 Presence of heart assist device: Secondary | ICD-10-CM | POA: Insufficient documentation

## 2023-03-25 DIAGNOSIS — R4189 Other symptoms and signs involving cognitive functions and awareness: Secondary | ICD-10-CM | POA: Diagnosis not present

## 2023-03-25 DIAGNOSIS — E039 Hypothyroidism, unspecified: Secondary | ICD-10-CM | POA: Diagnosis not present

## 2023-03-25 DIAGNOSIS — E2839 Other primary ovarian failure: Secondary | ICD-10-CM

## 2023-03-25 DIAGNOSIS — R5382 Chronic fatigue, unspecified: Secondary | ICD-10-CM

## 2023-03-25 DIAGNOSIS — I4891 Unspecified atrial fibrillation: Secondary | ICD-10-CM | POA: Diagnosis not present

## 2023-03-25 DIAGNOSIS — E7849 Other hyperlipidemia: Secondary | ICD-10-CM | POA: Diagnosis not present

## 2023-03-25 HISTORY — DX: Presence of heart assist device: Z95.811

## 2023-03-25 LAB — COMPREHENSIVE METABOLIC PANEL
ALT: 10 U/L (ref 0–35)
AST: 19 U/L (ref 0–37)
Albumin: 4.2 g/dL (ref 3.5–5.2)
Alkaline Phosphatase: 45 U/L (ref 39–117)
BUN: 29 mg/dL — ABNORMAL HIGH (ref 6–23)
CO2: 27 meq/L (ref 19–32)
Calcium: 9.7 mg/dL (ref 8.4–10.5)
Chloride: 101 meq/L (ref 96–112)
Creatinine, Ser: 0.94 mg/dL (ref 0.40–1.20)
GFR: 54.22 mL/min — ABNORMAL LOW (ref >60.00)
Glucose, Bld: 100 mg/dL — ABNORMAL HIGH (ref 70–99)
Potassium: 3.9 meq/L (ref 3.5–5.1)
Sodium: 140 meq/L (ref 135–145)
Total Bilirubin: 0.6 mg/dL (ref 0.2–1.2)
Total Protein: 6.9 g/dL (ref 6.0–8.3)

## 2023-03-25 LAB — CBC WITH DIFFERENTIAL/PLATELET
Basophils Absolute: 0 10*3/uL (ref 0.0–0.1)
Basophils Relative: 0.9 % (ref 0.0–3.0)
Eosinophils Absolute: 0.1 10*3/uL (ref 0.0–0.7)
Eosinophils Relative: 2.2 % (ref 0.0–5.0)
HCT: 40.5 % (ref 36.0–46.0)
Hemoglobin: 13.3 g/dL (ref 12.0–15.0)
Lymphocytes Relative: 22.3 % (ref 12.0–46.0)
Lymphs Abs: 1.3 10*3/uL (ref 0.7–4.0)
MCHC: 32.9 g/dL (ref 30.0–36.0)
MCV: 90.8 fL (ref 78.0–100.0)
Monocytes Absolute: 0.5 10*3/uL (ref 0.1–1.0)
Monocytes Relative: 8.1 % (ref 3.0–12.0)
Neutro Abs: 3.8 10*3/uL (ref 1.4–7.7)
Neutrophils Relative %: 66.5 % (ref 43.0–77.0)
Platelets: 153 10*3/uL (ref 150.0–400.0)
RBC: 4.46 Mil/uL (ref 3.87–5.11)
RDW: 15 % (ref 11.5–15.5)
WBC: 5.7 10*3/uL (ref 4.0–10.5)

## 2023-03-25 LAB — IRON: Iron: 67 ug/dL (ref 42–145)

## 2023-03-25 LAB — LIPID PANEL
Cholesterol: 221 mg/dL — ABNORMAL HIGH (ref 0–200)
HDL: 48 mg/dL (ref >39.00)
LDL Cholesterol: 158 mg/dL — ABNORMAL HIGH (ref 0–99)
NonHDL: 173.38
Total CHOL/HDL Ratio: 5
Triglycerides: 78 mg/dL (ref 0.0–149.0)
VLDL: 15.6 mg/dL (ref 0.0–40.0)

## 2023-03-25 LAB — POCT INR: INR: 1.9 — AB (ref 2.0–3.0)

## 2023-03-25 LAB — HEMOGLOBIN A1C: Hgb A1c MFr Bld: 6 % (ref 4.6–6.5)

## 2023-03-25 LAB — TSH: TSH: 3.69 u[IU]/mL (ref 0.35–5.50)

## 2023-03-25 NOTE — Patient Instructions (Addendum)
 Pre visit review using our clinic review tool, if applicable. No additional management support is needed unless otherwise documented below in the visit note.  Increase dose today to take 1 1/2 tablets and then continue 1 tablet daily except take 1/2 tablet on Wednesdays . Recheck in 3 weeks.

## 2023-03-25 NOTE — Assessment & Plan Note (Signed)
 Pt is on a drug holiday from alendronate  Dexa 06/2022 (pt thinks that the tech hurt her hip during this test and unsure if she would do it again) No falls or fractures Discussed fall prevention, supplements and exercise for bone density  Handout given

## 2023-03-25 NOTE — Assessment & Plan Note (Signed)
>>  ASSESSMENT AND PLAN FOR ATRIAL FIBRILLATION ([facility]CC) WRITTEN ON 03/25/2023  8:37 AM BY TOWER, MARNE A, MD  Blood pressure and rate are controlled today  Continues warfarin  Continues cardiology follow up

## 2023-03-25 NOTE — Assessment & Plan Note (Signed)
 Pt feels like she has chronic pnd  Does not like antihistamines-they all sedate her

## 2023-03-25 NOTE — Assessment & Plan Note (Signed)
 Labs today  Disc goals for lipids and reasons to control them Rev last labs with pt Rev low sat fat diet in detail Taking zetia

## 2023-03-25 NOTE — Assessment & Plan Note (Signed)
 Per pt about the same  Continues to be very independent

## 2023-03-25 NOTE — Assessment & Plan Note (Signed)
 Taking iron sulfate 325 mg daily   Cbc and iron today

## 2023-03-25 NOTE — Patient Instructions (Addendum)
 Try to get 1200-1500 mg of calcium per day with at least 2000 iu of vitamin D - for bone health If you don't tolerate calcium (can get it from diet) then just take the vitamin D  Take care of yourself   No change in medicines  Labs today  INR today

## 2023-03-25 NOTE — Assessment & Plan Note (Signed)
 No changes Takes iron for iron def  Also levothyroxine  May be depression/mood related  TSH and cbc today

## 2023-03-25 NOTE — Assessment & Plan Note (Signed)
 Still struggling Tried counseling and she did not like it  Continues to decline medication but aware it would be an option  PHQ 2  Would benefit from more socialization  Still has some worries about exposure to electricity and magnets (but not as bad as it was)

## 2023-03-25 NOTE — Progress Notes (Signed)
 Pt also had PCP apt today. Bisprolol increased to 5 mg every day by cardiology. Increase dose today to take 1 1/2 tablets and then continue 1 tablet daily except take 1/2 tablet on Wednesdays . Recheck in 3 weeks.

## 2023-03-25 NOTE — Assessment & Plan Note (Signed)
TSH today  No clinical changes  

## 2023-03-25 NOTE — Assessment & Plan Note (Signed)
 Continues cardiology care  At times some exercise intolerance but overall quite independent  Continues losartan 12.5 mg daily  Bisoprolol 5 mg daily  Jardiance 10 mg daily  Lastix 40 mg daily with K  Aldactone 12.5 mg daily

## 2023-03-25 NOTE — Progress Notes (Signed)
 Subjective:    Patient ID: [name], female    DOB: 07/03/1932, 89 y.o.   MRN: [id number]  HPI Pt presents for annual follow up of chronic problems   Wt Readings from Last 3 Encounters:  03/25/23 173 lb 6 oz (78.6 kg)  03/22/23 174 lb (78.9 kg)  03/08/23 174 lb (78.9 kg)   28.85 kg/m  Vitals:   03/25/23 0804  BP: 134/82  Pulse: 88  Temp: 97.8 F (36.6 C)  SpO2: 96%    Immunization History  Administered Date(s) Administered   Influenza,inj,Quad PF,6+ Mos 01/19/2017   Pneumococcal Conjugate-13 08/26/2016   Pneumococcal Polysaccharide-23 09/01/2017   Td 02/03/2005   Tdap 09/30/2020   Zoster Recombinant(Shingrix) 03/24/2022    There are no preventive care reminders to display for this patient. Doing fair  A lot of depression with isolation   Mammogram -declines due to age  Self breast exam- no lumps    Gyn health-no problems    Colon cancer screening -out aged   Bone health  Dexa 06/2022 osteopenia, stable   (hurt her hip in position for her dexa) - hip hurts  On a drug holiday from alendronate   Falls- none  Fractures-none  Supplements -? None for bones    Exercise - works around [employer], did not like it  Has to take frequent breaks due to heart issues    Mood    03/22/2023    8:35 AM 03/18/2022   11:12 AM 11/06/2021    8:25 AM 06/06/2021    9:17 AM 03/10/2021    1:59 PM  Depression screen PHQ 2/9  Decreased Interest 0 0 0 2 0  Down, Depressed, Hopeless 1 0 1 1 0  PHQ - 2 Score 1 0 1 3 0  Altered sleeping    1   Tired, decreased energy    1   Change in appetite    0   Feeling bad or failure about yourself     2   Trouble concentrating    3   Moving slowly or fidgety/restless    0   Suicidal thoughts    0   PHQ-9 Score    10   Difficult doing work/chores    Somewhat difficult   Has declined medication for mood  This is difficult for her - a difficult time for her / coming to grips with her age also Tried counseling and  disliked it  She talks out loud through her devotionals daily   Still struggles with the electricity zap and exposure  Avoids batteries but overall improved from what it was   Heart condition affects her sleep a lot  Gets frustrated re: what she can and can't do    Independent  Drives during the day (not at night)  Occational misplaces things    Continues cardiology care for a fib and cardiomyopathy  Losartan  12.5 mg daily  Bisoprolol  5 mg daily  Jardiance  10 mg daily  Lasix  40 mg daily with K  Warfarin  Aldactone  12.5 mg daily   BP Readings from Last 3 Encounters:  03/25/23 134/82  03/08/23 (!) 119/51  01/26/23 (!) 115/55   Pulse Readings from Last 3 Encounters:  03/25/23 88  03/08/23 79  01/26/23 72   Due for INR today for warfarin   Lab Results  Component Value Date   NA 144 01/26/2023   K 4.0 01/26/2023   CO2 28 01/26/2023   GLUCOSE 98 01/26/2023  BUN 19 01/26/2023   CREATININE 1.00 01/26/2023   CALCIUM  9.0 01/26/2023   GFR 59.80 (L) 07/03/2022   EGFR 54 (L) 01/26/2023   GFRNONAA 56 (L) 12/16/2022    Lab Results  Component Value Date   WBC 6.8 12/16/2022   HGB 13.7 12/16/2022   HCT 41.9 12/16/2022   MCV 88.4 12/16/2022   PLT 174 12/16/2022   Iron  def anemia-taking iron   Lab Results  Component Value Date   IRON  71 07/03/2022   TIBC 350 12/28/2018   FERRITIN 107.2 07/03/2022      Hypothyroidism  Pt has no clinical changes No change in energy level/ hair or skin/ edema and no tremor Lab Results  Component Value Date   TSH 3.161 07/20/2022    Levothyroxine  75 mcg daily  Cholesterol Lab Results  Component Value Date   CHOL 194 03/12/2022   CHOL 165 12/20/2020   CHOL 178 03/04/2020   Lab Results  Component Value Date   HDL 34.80 (L) 03/12/2022   HDL 46.30 12/20/2020   HDL 37.80 (L) 03/04/2020   Lab Results  Component Value Date   LDLCALC 134 (H) 03/12/2022   LDLCALC 105 (H) 12/20/2020   LDLCALC 125 (H) 03/04/2020   Lab  Results  Component Value Date   TRIG 125.0 03/12/2022   TRIG 71.0 12/20/2020   TRIG 76.0 03/04/2020   Lab Results  Component Value Date   CHOLHDL 6 03/12/2022   CHOLHDL 4 12/20/2020   CHOLHDL 5 03/04/2020   Lab Results  Component Value Date   LDLDIRECT 203.8 01/09/2008   Eats very well  Lot of salad Avoids fried foods      Patient Active Problem List   Diagnosis Date Noted   Presence of heart assist device (HCC) 03/25/2023   Cardiomyopathy, unspecified (HCC) 03/08/2023   Acute systolic heart failure (HCC) 09/21/2022   Nonrheumatic mitral valve regurgitation 09/21/2022   HFrEF (heart failure with reduced ejection fraction) (HCC) 09/16/2022   Abnormal urinalysis 07/09/2022   Encounter for screening mammogram for breast cancer 03/19/2022   PND (post-nasal drip) 12/11/2021   Other fatigue 10/08/2021   Brain fog 01/14/2021   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/20/2020   Facial paresthesia 10/14/2020   Grief reaction 09/20/2020   Hearing loss 06/13/2020   Atrial fibrillation (HCC) 04/24/2020   Routine general medical examination at a health care facility 03/11/2020   Elevated glucose 03/03/2020   History of TIA (transient ischemic attack) 02/18/2020   S/P AV nodal ablation 07/28/19 07/29/2019   S/P placement of cardiac pacemaker MDT 07/28/19 07/29/2019   AV block 07/28/2019   History of CVA (cerebrovascular accident) 06/16/2019   Facial tingling 11/29/2018   Tremor of left hand 11/29/2018   Medicare annual wellness visit, subsequent 11/24/2018   Dizzy 02/04/2018   Iron  deficiency anemia 10/21/2017   Rapid atrial fibrillation (HCC) 08/19/2017   Constipation 08/02/2017   Unilateral primary osteoarthritis, left hip 05/04/2017   Status post total replacement of left hip 05/04/2017   Hip osteoarthritis 04/27/2017   Long term (current) use of anticoagulants 03/04/2017   Venous stasis dermatitis of both lower extremities 01/08/2017   Osteopenia 10/25/2016   Pedal edema 08/26/2016    Varicose veins of both lower extremities 08/26/2016   Estrogen deficiency 08/26/2016   Hemorrhoids 08/26/2016   History of nonmelanoma skin cancer 01/01/2016   Urticaria 08/22/2014   Hip pain 08/02/2014   Left knee pain 08/02/2014   Chronic cough 05/08/2014   Hematochezia 04/09/2014   Colon cancer screening 08/16/2013  Fatigue 08/16/2013   Encounter for therapeutic drug monitoring 04/20/2013   Left ovarian cyst 03/14/2013   (HFpEF) heart failure with preserved ejection fraction (HCC) 12/27/2012   History of colonic polyps 09/18/2009   PULMONARY NODULE 12/20/2008   Ganglion 10/04/2007   Mixed incontinence 04/28/2007   Hyperlipidemia 04/27/2007   Depression with anxiety 04/27/2007   Asthma, mild intermittent 04/27/2007   INSOMNIA 04/27/2007   ADENOMATOUS COLONIC POLYP 11/04/2006   Hypothyroidism 09/02/2006   Atrial fibrillation, chronic (HCC) 08/05/2006   Past Medical History:  Diagnosis Date   Allergic rhinitis    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 s/p PVI [name] 2010;  b. on tikosyn /coumadin ;  c. 05/2009 Echo: EF 60-65%, Gr 2 DD. (first dx 09/ 2007)   Bilateral lower extremity edema    Bleeding hemorrhoid    Carotid stenosis    mild (hosp 3/11)- consult by vasc/ Dr Oris   Complication of anesthesia    hard to wake   Diverticulosis of colon    Dyspnea    on exertion-climbing stairs   Fatty liver    H/O cardiac radiofrequency ablation    01/ 2008 at [facility]  /  03/ 2010  at [facility]   Heart failure with preserved ejection fraction [facility])    History of adenomatous polyp of colon    tubular adenoma's   History of cardiomyopathy    secondary tachycardia-induced cardiomyopathy -- resolved 2014   History of squamous cell carcinoma in situ (SCCIS) of skin    05/ 2017  nasal bridge and right medial knee   History of transient ischemic attack  (TIA)    01-24-2005 and 06-12-2009   Hyperlipidemia    Hypothyroidism    Mild intermittent asthma    reacts to cats   Mixed stress and urge urinary incontinence    Presence of permanent cardiac pacemaker    was put in 07/2019   Pulmonary nodule    S/P AV nodal ablation 07/28/19 07/29/2019   S/P mitral valve repair 10-23-1998  dr lansing at [facility]   for MVP and regurg. (annuloplasty ring procedure)   S/P placement of cardiac pacemaker MDT 07/28/19 07/29/2019   Past Surgical History:  Procedure Laterality Date   APPENDECTOMY  1978   AV NODE ABLATION N/A 07/28/2019   Procedure: AV NODE ABLATION;  Surgeon: Fernande Elspeth BROCKS, MD;  Location: [facility] INVASIVE CV LAB;  Service: Cardiovascular;  Laterality: N/A;   BUBBLE STUDY  06/19/2019   Procedure: BUBBLE STUDY;  Surgeon: Mona Vinie BROCKS, MD;  Location: [facility] ENDOSCOPY;  Service: Cardiovascular;;   CARDIAC ELECTROPHYSIOLOGY MAPPING AND ABLATION  01/ 2008    at [facility]    right-sided ablation atrial flutter   CARDIAC ELECTROPHYSIOLOGY STUDY AND ABLATION  03/ 2010   dr ogles at [facility]   AV node ablation and pulmonary vein isolation for atrial fib   CARDIOVERSION  06-18-2006;  07-13-2006;  10-19-2010;  10-27-2010   CATARACT EXTRACTION W/PHACO Right 04/09/2021   Procedure: CATARACT EXTRACTION PHACO AND INTRAOCULAR LENS PLACEMENT (IOC) RIGHT 6.71 01:11.1;  Surgeon: Mittie Gaskin, MD;  Location: [facility] SURGERY CNTR;  Service: Ophthalmology;  Laterality: Right;   CATARACT EXTRACTION W/PHACO Left 04/23/2021   Procedure: CATARACT EXTRACTION PHACO AND INTRAOCULAR LENS PLACEMENT (IOC) LEFT;  Surgeon: Mittie Gaskin, MD;  Location: [facility] SURGERY CNTR;  Service: Ophthalmology;  Laterality: Left;  [place] 5.65 00:50.1   COLONOSCOPY     COLONOSCOPY WITH PROPOFOL  N/A 10/13/2017   Procedure: COLONOSCOPY WITH PROPOFOL ;  Surgeon: Therisa Bi, MD;  Location: [facility] ENDOSCOPY;  Service: Gastroenterology;  Laterality: N/A;   COLONOSCOPY WITH  PROPOFOL  N/A 02/20/2020   Procedure: COLONOSCOPY WITH PROPOFOL ;  Surgeon: Maryruth Ole DASEN, MD;  Location: [facility] ENDOSCOPY;  Service: Endoscopy;  Laterality: N/A;   CYSTO/ TRANSURETHRAL COLLAGEN INJECTION THERAPY  07-26-2007   dr gaston   DILATION AND CURETTAGE OF UTERUS     ESOPHAGOGASTRODUODENOSCOPY (EGD) WITH PROPOFOL  N/A 10/13/2017   Procedure: ESOPHAGOGASTRODUODENOSCOPY (EGD) WITH PROPOFOL ;  Surgeon: Therisa Bi, MD;  Location: [facility] ENDOSCOPY;  Service: Gastroenterology;  Laterality: N/A;   EVALUATION UNDER ANESTHESIA WITH HEMORRHOIDECTOMY N/A 04/09/2020   Procedure: EXAM UNDER ANESTHESIA WITH HEMORRHOIDECTOMY;  Surgeon: Jordis Laneta FALCON, MD;  Location: [facility] ORS;  Service: General;  Laterality: N/A;   EXCISIONAL HEMORRHOIDECTOMY  1980s   GIVENS CAPSULE STUDY N/A 12/08/2017   Procedure: GIVENS CAPSULE STUDY;  Surgeon: Therisa Bi, MD;  Location: [facility] ENDOSCOPY;  Service: Gastroenterology;  Laterality: N/A;   HEMORRHOID SURGERY N/A 10/29/2016   Procedure: HEMORRHOIDECTOMY;  Surgeon: Debby Hila, MD;  Location: [facility];  Service: General;  Laterality: N/A;   MITRAL VALVE ANNULOPLASTY  10/23/1998   Model [id number]; [place] [id number]; size 32mm; [facility]; Dr. Lansing   PACEMAKER IMPLANT N/A 07/28/2019   Procedure: PACEMAKER IMPLANT;  Surgeon: Fernande Elspeth BROCKS, MD;  Location: [facility] INVASIVE CV LAB;  Service: Cardiovascular;  Laterality: N/A;   PILONIDAL CYST EXCISION  1954   RIGHT/LEFT HEART CATH AND CORONARY ANGIOGRAPHY Bilateral 09/21/2022   Procedure: RIGHT/LEFT HEART CATH AND CORONARY ANGIOGRAPHY;  Surgeon: Darron Deatrice LABOR, MD;  Location: [facility] INVASIVE CV LAB;  Service: Cardiovascular;  Laterality: Bilateral;   TEE WITH CARDIOVERSION  05-06-2006 at [facility];  01-02-2013 at [facility]   TEE WITHOUT CARDIOVERSION N/A 06/19/2019   Procedure: TRANSESOPHAGEAL ECHOCARDIOGRAM (TEE);  Surgeon: Mona Vinie BROCKS, MD;  Location: [facility] ENDOSCOPY;  Service: Cardiovascular;  Laterality: N/A;   TOTAL  HIP ARTHROPLASTY Left 05/04/2017   Procedure: LEFT TOTAL HIP ARTHROPLASTY ANTERIOR APPROACH;  Surgeon: Vernetta Lonni GRADE, MD;  Location: [facility] OR;  Service: Orthopedics;  Laterality: Left;   TRANSTHORACIC ECHOCARDIOGRAM  05-01-2015   dr fernande   ef 50-55%/  mild AV sclerosis without stenosis/  post MV repair with mild central MR (valve area by pressure half-time 2cm^2,  valve area by continutity equation 0.91cm^2, peak grandiant 73mmHg)/  severe LAE/ mild TR/ mild RAE    TUBAL LIGATION Bilateral 1978   Social History   Tobacco Use   Smoking status: Never   Smokeless tobacco: Never  Vaping Use   Vaping status: Never Used  Substance Use Topics   Alcohol  use: Not Currently    Comment: seldom   Drug use: No   Family History  Problem Relation Age of Onset   Lung cancer Father        smoker, died at 52   Alcohol  abuse Father    Cancer Father        bladder and lung CA smoker   Sudden death Other    Breast cancer Neg Hx    Stroke Neg Hx    Allergies  Allergen Reactions   Amiodarone Swelling    SWELLING REACTION UNSPECIFIED    Penicillins Hives and Rash    Has patient had a PCN reaction causing immediate rash, facial/tongue/throat swelling, SOB or lightheadedness with hypotension: No Has patient had a PCN reaction  causing severe rash involving mucus membranes or skin necrosis: No Has patient had a PCN reaction that required hospitalization:Patient was inpatient when reaction occurred Has patient had a PCN reaction occurring within the last 10 years: No If all of the above answers are NO, then may proceed with Cephalosporin use   Metoprolol  Other (See Comments)    Hair loss   Statins Rash    REACTION: rash   Current Outpatient Medications on File Prior to Visit  Medication Sig Dispense Refill   Acetaminophen  (TYLENOL  PO) Take 1 tablet by mouth as needed.     albuterol  (VENTOLIN  HFA) 108 (90 Base) MCG/ACT inhaler Inhale 1-2 puffs into the lungs every 6 (six) hours as needed for  wheezing or shortness of breath. 18 g 2   bisoprolol  (ZEBETA ) 5 MG tablet Take 1 tablet (5 mg total) by mouth daily. 30 tablet 11   empagliflozin  (JARDIANCE ) 10 MG TABS tablet Take 1 tablet (10 mg total) by mouth daily before breakfast. 30 tablet 6   ferrous sulfate  325 (65 FE) MG EC tablet TAKE 1 TABLET EVERY DAY WITH BREAKFAST 90 tablet 0   fluticasone  (FLONASE ) 50 MCG/ACT nasal spray Place 1 spray into both nostrils 2 (two) times daily as needed for allergies or rhinitis. 16 g 11   furosemide  (LASIX ) 40 MG tablet Take 1 tablet (40 mg total) by mouth daily. 90 tablet 3   levothyroxine  (SYNTHROID ) 75 MCG tablet TAKE 1 TABLET EVERY DAY BEFORE BREAKFAST 90 tablet 1   losartan  (COZAAR ) 25 MG tablet Take 1 tablet (25 mg total) by mouth at bedtime. 90 tablet 1   mexiletine (MEXITIL ) 200 MG capsule Take 1 capsule (200 mg total) by mouth 2 (two) times daily. 180 capsule 3   potassium chloride  SA (KLOR-CON  M) 20 MEQ tablet Take 1 tablet (20 mEq total) by mouth daily for 3 days. 3 tablet 0   spironolactone  (ALDACTONE ) 25 MG tablet Take 0.5 tablets (12.5 mg total) by mouth daily. 45 tablet 3   VITAMIN D  PO Take by mouth daily.     warfarin (COUMADIN ) 5 MG tablet TAKE 1 TABLET DAILY EXCEPT TAKE 1/2 TABLET ON MONDAYS AND THURSDAYS OR AS DIRECTED BY COUMADIN  CLINIC 105 tablet 1   No current facility-administered medications on file prior to visit.    Review of Systems  Constitutional:  Positive for fatigue. Negative for activity change, appetite change, fever and unexpected weight change.  HENT:  Positive for postnasal drip. Negative for congestion, ear pain, rhinorrhea, sinus pressure and sore throat.        Chronic pnd  Antihistamines make her too sleepy  Eyes:  Negative for pain, redness and visual disturbance.  Respiratory:  Negative for cough, shortness of breath and wheezing.        No shortness of breath today  Does have some exercise intolerance   Cardiovascular:  Negative for chest pain and  palpitations.  Gastrointestinal:  Negative for abdominal pain, blood in stool, constipation and diarrhea.  Endocrine: Negative for polydipsia and polyuria.  Genitourinary:  Negative for dysuria, frequency and urgency.  Musculoskeletal:  Positive for arthralgias. Negative for back pain and myalgias.  Skin:  Negative for pallor and rash.  Allergic/Immunologic: Negative for environmental allergies.  Neurological:  Negative for dizziness, syncope and headaches.  Hematological:  Negative for adenopathy. Does not bruise/bleed easily.  Psychiatric/Behavioral:  Positive for dysphoric mood and sleep disturbance. Negative for decreased concentration. The patient is not nervous/anxious.        Objective:  Physical Exam Constitutional:      General: She is not in acute distress.    Appearance: Normal appearance. She is well-developed.     Comments: Overweight   HENT:     Head: Normocephalic and atraumatic.     Mouth/Throat:     Mouth: Mucous membranes are moist.  Eyes:     General: No scleral icterus.    Conjunctiva/sclera: Conjunctivae normal.     Pupils: Pupils are equal, round, and reactive to light.  Neck:     Thyroid : No thyromegaly.     Vascular: No carotid bruit or JVD.  Cardiovascular:     Rate and Rhythm: Normal rate and regular rhythm.     Heart sounds: Normal heart sounds.     No gallop.     Comments: Fairly regular rhythm today  Varicosities in legs  No edema  Pulmonary:     Effort: Pulmonary effort is normal. No respiratory distress.     Breath sounds: Normal breath sounds. No stridor. No wheezing, rhonchi or rales.     Comments: No crackles  Abdominal:     General: There is no distension or abdominal bruit.     Palpations: Abdomen is soft. There is no mass.     Tenderness: There is no abdominal tenderness. There is no guarding or rebound.  Musculoskeletal:     Cervical back: Normal range of motion and neck supple.     Right lower leg: No edema.     Left lower leg:  No edema.  Lymphadenopathy:     Cervical: No cervical adenopathy.  Skin:    General: Skin is warm and dry.     Coloration: Skin is not pale.     Findings: No rash.  Neurological:     Mental Status: She is alert.     Coordination: Coordination normal.     Deep Tendon Reflexes: Reflexes are normal and symmetric. Reflexes normal.  Psychiatric:        Mood and Affect: Mood normal.     Comments: Candidly discusses symptoms and stressors   Pleasant ant talkative            Assessment & Plan:   Problem List Items Addressed This Visit       Cardiovascular and Mediastinum   HFrEF (heart failure with reduced ejection fraction) (HCC)   Continues cardiology care  At times some exercise intolerance but overall quite independent  Continues losartan  12.5 mg daily  Bisoprolol  5 mg daily  Jardiance  10 mg daily  Lastix 40 mg daily with K  Aldactone  12.5 mg daily        Atrial fibrillation (HCC)   Blood pressure and rate are controlled today  Continues warfarin  Continues cardiology follow up         Endocrine   Hypothyroidism - Primary   TSH today  No clinical changes       Relevant Orders   TSH     Musculoskeletal and Integument   Osteopenia   Pt is on a drug holiday from alendronate   Dexa 06/2022 (pt thinks that the tech hurt her hip during this test and unsure if she would do it again) No falls or fractures Discussed fall prevention, supplements and exercise for bone density  Handout given         Other   Long term (current) use of anticoagulants   Iron  deficiency anemia   Taking iron  sulfate 325 mg daily   Cbc and iron  today  Relevant Orders   CBC with Differential/Platelet   Iron    Hyperlipidemia   Labs today  Disc goals for lipids and reasons to control them Rev last labs with pt Rev low sat fat diet in detail Taking zetia        Relevant Orders   Comprehensive metabolic panel   Lipid panel   Fatigue   No changes Takes iron  for iron  def   Also levothyroxine   May be depression/mood related  TSH and cbc today      Elevated glucose   A1c ordered Takes jardiance  for heart failure       Relevant Orders   Hemoglobin A1c   Depression with anxiety   Still struggling Tried counseling and she did not like it  Continues to decline medication but aware it would be an option  PHQ 2  Would benefit from more socialization  Still has some worries about exposure to electricity and magnets (but not as bad as it was)       Chronic cough   Pt feels like she has chronic pnd  Does not like antihistamines-they all sedate her       Brain fog   Per pt about the same  Continues to be very independent

## 2023-03-25 NOTE — Assessment & Plan Note (Signed)
>>  ASSESSMENT AND PLAN FOR HFREF (HEART FAILURE WITH REDUCED EJECTION FRACTION) (HCC) WRITTEN ON 03/25/2023  8:47 AM BY TOWER, MARNE A, MD  Continues cardiology care  At times some exercise intolerance but overall quite independent  Continues losartan  12.5 mg daily  Bisoprolol  5 mg daily  Jardiance  10 mg daily  Lastix 40 mg daily with K  Aldactone  12.5 mg daily

## 2023-03-25 NOTE — Assessment & Plan Note (Signed)
 Blood pressure and rate are controlled today  Continues warfarin  Continues cardiology follow up

## 2023-03-25 NOTE — Assessment & Plan Note (Signed)
 A1c ordered Takes jardiance for heart failure

## 2023-03-29 ENCOUNTER — Encounter: Payer: Self-pay | Admitting: *Deleted

## 2023-04-07 ENCOUNTER — Other Ambulatory Visit: Payer: Self-pay

## 2023-04-07 MED ORDER — SPIRONOLACTONE 25 MG PO TABS: 12.5000 mg | ORAL_TABLET | Freq: Every day | ORAL | 3 refills | Status: DC

## 2023-04-09 ENCOUNTER — Other Ambulatory Visit: Payer: Self-pay

## 2023-04-09 MED ORDER — SPIRONOLACTONE 25 MG PO TABS: 12.5000 mg | ORAL_TABLET | Freq: Every day | ORAL | 3 refills | Status: AC

## 2023-04-15 ENCOUNTER — Ambulatory Visit: Payer: Medicare Other

## 2023-04-15 DIAGNOSIS — Z7901 Long term (current) use of anticoagulants: Secondary | ICD-10-CM | POA: Diagnosis not present

## 2023-04-15 LAB — POCT INR: INR: 2.5 (ref 2.0–3.0)

## 2023-04-15 NOTE — Patient Instructions (Addendum)
Pre visit review using our clinic review tool, if applicable. No additional management support is needed unless otherwise documented below in the visit note.  Continue 1 tablet daily except take 1/2 tablet on Wednesdays. Recheck in 4 weeks.  

## 2023-04-15 NOTE — Progress Notes (Signed)
Continue 1 tablet daily except take 1/2 tablet on Wednesdays. Recheck in 4 weeks.

## 2023-04-19 DIAGNOSIS — D2239 Melanocytic nevi of other parts of face: Secondary | ICD-10-CM | POA: Diagnosis not present

## 2023-04-19 DIAGNOSIS — Z85828 Personal history of other malignant neoplasm of skin: Secondary | ICD-10-CM | POA: Diagnosis not present

## 2023-04-19 DIAGNOSIS — L820 Inflamed seborrheic keratosis: Secondary | ICD-10-CM | POA: Diagnosis not present

## 2023-04-19 DIAGNOSIS — L821 Other seborrheic keratosis: Secondary | ICD-10-CM | POA: Diagnosis not present

## 2023-04-23 ENCOUNTER — Ambulatory Visit: Payer: Medicare Other

## 2023-04-23 DIAGNOSIS — I4430 Unspecified atrioventricular block: Secondary | ICD-10-CM

## 2023-04-26 LAB — CUP PACEART REMOTE DEVICE CHECK
Battery Remaining Longevity: 93 mo
Battery Voltage: 3.01 V
Brady Statistic AP VP Percent: 0 %
Brady Statistic AP VS Percent: 0 %
Brady Statistic AS VP Percent: 99.57 %
Brady Statistic AS VS Percent: 0.43 %
Brady Statistic RA Percent Paced: 0 %
Brady Statistic RV Percent Paced: 99.57 %
Date Time Interrogation Session: 20250130215505
Implantable Lead Connection Status: 753985
Implantable Lead Implant Date: 20210507
Implantable Lead Location: 753860
Implantable Lead Model: 5076
Implantable Pulse Generator Implant Date: 20210507
Lead Channel Impedance Value: 3306 Ohm
Lead Channel Impedance Value: 3306 Ohm
Lead Channel Impedance Value: 399 Ohm
Lead Channel Impedance Value: 437 Ohm
Lead Channel Pacing Threshold Amplitude: 0.75 V
Lead Channel Pacing Threshold Pulse Width: 0.4 ms
Lead Channel Sensing Intrinsic Amplitude: 1.875 mV
Lead Channel Sensing Intrinsic Amplitude: 12.75 mV
Lead Channel Sensing Intrinsic Amplitude: 8.625 mV
Lead Channel Setting Pacing Amplitude: 2.5 V
Lead Channel Setting Pacing Pulse Width: 0.4 ms
Lead Channel Setting Sensing Sensitivity: 4 mV
Zone Setting Status: 755011

## 2023-05-13 ENCOUNTER — Ambulatory Visit: Payer: Medicare Other

## 2023-05-16 ENCOUNTER — Other Ambulatory Visit: Payer: Self-pay | Admitting: Cardiology

## 2023-05-20 ENCOUNTER — Ambulatory Visit (INDEPENDENT_AMBULATORY_CARE_PROVIDER_SITE_OTHER): Payer: Medicare Other

## 2023-05-20 ENCOUNTER — Telehealth: Payer: Self-pay

## 2023-05-20 DIAGNOSIS — Z7901 Long term (current) use of anticoagulants: Secondary | ICD-10-CM | POA: Diagnosis not present

## 2023-05-20 LAB — POCT INR: INR: 1.9 — AB (ref 2.0–3.0)

## 2023-05-20 NOTE — Progress Notes (Signed)
 Pt thinks she missed a dose a couple of days ago. Increase dose today to take 1 1/2 tablets and then continue 1 tablet daily except take 1/2 tablet on Wednesdays . Recheck in 3 weeks.

## 2023-05-20 NOTE — Telephone Encounter (Signed)
 Not a lot safe for her age group for sleep , fragmented sleep at her age can be common and I usually recommend getting up , reading a while and trying to go back to bed later  Melatonin may or may not help- may be worth a try again  A prescription option I sometimes use with caution is trazodone -can discuss if melatonin does not work  Avoid caffeine completely /even in the am Avoid alcohol Try to be active during the day

## 2023-05-20 NOTE — Patient Instructions (Addendum)
 Pre visit review using our clinic review tool, if applicable. No additional management support is needed unless otherwise documented below in the visit note.  Increase dose today to take 1 1/2 tablets and then continue 1 tablet daily except take 1/2 tablet on Wednesdays . Recheck in 3 weeks.

## 2023-05-20 NOTE — Telephone Encounter (Signed)
 Pt in today for coumadin clinic and requested a msg sent to PCP concerning her sleep. Pt reports waking up around 3 am and is unable to fall back asleep. Pt denies any problems falling asleep. She is requesting any suggestions from PCP. Advised pt to use melatonin. She thinks she has used this in the past and it has not helped. Advised a msg would be sent to PCP and the office would f/u. Pt verbalized understanding.

## 2023-05-21 NOTE — Telephone Encounter (Signed)
Called pt and no answer and pt's VM box is full 

## 2023-05-24 ENCOUNTER — Encounter: Payer: Self-pay | Admitting: Internal Medicine

## 2023-05-26 ENCOUNTER — Other Ambulatory Visit: Payer: Self-pay

## 2023-05-26 MED ORDER — MEXILETINE HCL 200 MG PO CAPS: 200.0000 mg | ORAL_CAPSULE | Freq: Two times a day (BID) | ORAL | 0 refills | Status: DC

## 2023-05-26 NOTE — Telephone Encounter (Signed)
 This is a Educational psychologist pt

## 2023-05-28 NOTE — Addendum Note (Signed)
 Addended by: Elease Etienne A on: 05/28/2023 12:54 PM   Modules accepted: Orders

## 2023-05-28 NOTE — Progress Notes (Signed)
 Remote pacemaker transmission.

## 2023-06-05 ENCOUNTER — Other Ambulatory Visit: Payer: Self-pay | Admitting: Cardiology

## 2023-06-10 ENCOUNTER — Ambulatory Visit (INDEPENDENT_AMBULATORY_CARE_PROVIDER_SITE_OTHER): Payer: Medicare Other

## 2023-06-10 DIAGNOSIS — Z7901 Long term (current) use of anticoagulants: Secondary | ICD-10-CM | POA: Diagnosis not present

## 2023-06-10 LAB — POCT INR: INR: 2.3 (ref 2.0–3.0)

## 2023-06-10 NOTE — Progress Notes (Signed)
Continue 1 tablet daily except take 1/2 tablet on Wednesdays. Recheck in 4 weeks.

## 2023-06-10 NOTE — Patient Instructions (Addendum)
Pre visit review using our clinic review tool, if applicable. No additional management support is needed unless otherwise documented below in the visit note.  Continue 1 tablet daily except take 1/2 tablet on Wednesdays. Recheck in 4 weeks.  

## 2023-06-25 ENCOUNTER — Other Ambulatory Visit: Payer: Self-pay | Admitting: *Deleted

## 2023-06-25 MED ORDER — FERROUS SULFATE 325 (65 FE) MG PO TBEC: 325.0000 mg | DELAYED_RELEASE_TABLET | Freq: Every day | ORAL | 2 refills | Status: DC

## 2023-07-05 ENCOUNTER — Other Ambulatory Visit: Payer: Self-pay | Admitting: Cardiology

## 2023-07-08 ENCOUNTER — Ambulatory Visit (INDEPENDENT_AMBULATORY_CARE_PROVIDER_SITE_OTHER)

## 2023-07-08 DIAGNOSIS — Z7901 Long term (current) use of anticoagulants: Secondary | ICD-10-CM

## 2023-07-08 LAB — POCT INR: INR: 3.1 — AB (ref 2.0–3.0)

## 2023-07-08 NOTE — Patient Instructions (Addendum)
 Pre visit review using our clinic review tool, if applicable. No additional management support is needed unless otherwise documented below in the visit note.  Reduce dose tomorrow to take 1/2 tablet and then continue 1 tablet daily except take 1/2 tablet on Wednesdays . Recheck in 3 weeks.

## 2023-07-08 NOTE — Progress Notes (Signed)
 Pt already took warfarin today. Reduce dose tomorrow to take 1/2 tablet and then continue 1 tablet daily except take 1/2 tablet on Wednesdays . Recheck in 3 weeks.

## 2023-07-23 ENCOUNTER — Ambulatory Visit (INDEPENDENT_AMBULATORY_CARE_PROVIDER_SITE_OTHER): Payer: Medicare Other

## 2023-07-23 DIAGNOSIS — I4430 Unspecified atrioventricular block: Secondary | ICD-10-CM

## 2023-07-26 ENCOUNTER — Emergency Department (HOSPITAL_COMMUNITY)
Admission: EM | Admit: 2023-07-26 | Discharge: 2023-07-26 | Disposition: A | Discharge status: Home / Self Care | Attending: Emergency Medicine | Admitting: Emergency Medicine

## 2023-07-26 ENCOUNTER — Encounter (HOSPITAL_COMMUNITY): Payer: Self-pay

## 2023-07-26 ENCOUNTER — Emergency Department (HOSPITAL_COMMUNITY)

## 2023-07-26 ENCOUNTER — Encounter: Payer: Self-pay | Admitting: Cardiology

## 2023-07-26 ENCOUNTER — Other Ambulatory Visit: Payer: Self-pay

## 2023-07-26 DIAGNOSIS — Z0389 Encounter for observation for other suspected diseases and conditions ruled out: Secondary | ICD-10-CM | POA: Diagnosis not present

## 2023-07-26 DIAGNOSIS — U071 COVID-19: Secondary | ICD-10-CM | POA: Diagnosis not present

## 2023-07-26 DIAGNOSIS — R531 Weakness: Secondary | ICD-10-CM | POA: Diagnosis not present

## 2023-07-26 DIAGNOSIS — D696 Thrombocytopenia, unspecified: Secondary | ICD-10-CM | POA: Diagnosis not present

## 2023-07-26 DIAGNOSIS — R509 Fever, unspecified: Secondary | ICD-10-CM

## 2023-07-26 DIAGNOSIS — Z950 Presence of cardiac pacemaker: Secondary | ICD-10-CM | POA: Diagnosis not present

## 2023-07-26 DIAGNOSIS — I700 Atherosclerosis of aorta: Secondary | ICD-10-CM | POA: Diagnosis not present

## 2023-07-26 LAB — CBC WITH DIFFERENTIAL/PLATELET
Abs Immature Granulocytes: 0.02 10*3/uL (ref 0.00–0.07)
Basophils Absolute: 0 10*3/uL (ref 0.0–0.1)
Basophils Relative: 1 %
Eosinophils Absolute: 0 10*3/uL (ref 0.0–0.5)
Eosinophils Relative: 0 %
HCT: 38.6 % (ref 36.0–46.0)
Hemoglobin: 12.3 g/dL (ref 12.0–15.0)
Immature Granulocytes: 0 %
Lymphocytes Relative: 8 %
Lymphs Abs: 0.4 10*3/uL — ABNORMAL LOW (ref 0.7–4.0)
MCH: 29.6 pg (ref 26.0–34.0)
MCHC: 31.9 g/dL (ref 30.0–36.0)
MCV: 92.8 fL (ref 80.0–100.0)
Monocytes Absolute: 0.5 10*3/uL (ref 0.1–1.0)
Monocytes Relative: 10 %
Neutro Abs: 3.8 10*3/uL (ref 1.7–7.7)
Neutrophils Relative %: 81 %
Platelets: 124 10*3/uL — ABNORMAL LOW (ref 150–400)
RBC: 4.16 MIL/uL (ref 3.87–5.11)
RDW: 14.6 % (ref 11.5–15.5)
WBC: 4.7 10*3/uL (ref 4.0–10.5)
nRBC: 0 % (ref 0.0–0.2)

## 2023-07-26 LAB — COMPREHENSIVE METABOLIC PANEL WITH GFR
ALT: 19 U/L (ref 0–44)
AST: 28 U/L (ref 15–41)
Albumin: 3.6 g/dL (ref 3.5–5.0)
Alkaline Phosphatase: 38 U/L (ref 38–126)
Anion gap: 11 (ref 5–15)
BUN: 23 mg/dL (ref 8–23)
CO2: 24 mmol/L (ref 22–32)
Calcium: 8.5 mg/dL — ABNORMAL LOW (ref 8.9–10.3)
Chloride: 100 mmol/L (ref 98–111)
Creatinine, Ser: 1.08 mg/dL — ABNORMAL HIGH (ref 0.44–1.00)
GFR, Estimated: 49 mL/min — ABNORMAL LOW (ref >60)
Glucose, Bld: 97 mg/dL (ref 70–99)
Potassium: 4 mmol/L (ref 3.5–5.1)
Sodium: 135 mmol/L (ref 135–145)
Total Bilirubin: 1 mg/dL (ref 0.0–1.2)
Total Protein: 7.2 g/dL (ref 6.5–8.1)

## 2023-07-26 LAB — CUP PACEART REMOTE DEVICE CHECK
Battery Remaining Longevity: 89 mo
Battery Voltage: 3 V
Brady Statistic AP VP Percent: 0 %
Brady Statistic AP VS Percent: 0 %
Brady Statistic AS VP Percent: 99.52 %
Brady Statistic AS VS Percent: 0.48 %
Brady Statistic RA Percent Paced: 0 %
Brady Statistic RV Percent Paced: 99.52 %
Date Time Interrogation Session: 20250502203258
Implantable Lead Connection Status: 753985
Implantable Lead Implant Date: 20210507
Implantable Lead Location: 753860
Implantable Lead Model: 5076
Implantable Pulse Generator Implant Date: 20210507
Lead Channel Impedance Value: 3306 Ohm
Lead Channel Impedance Value: 3306 Ohm
Lead Channel Impedance Value: 380 Ohm
Lead Channel Impedance Value: 418 Ohm
Lead Channel Pacing Threshold Amplitude: 0.875 V
Lead Channel Pacing Threshold Pulse Width: 0.4 ms
Lead Channel Sensing Intrinsic Amplitude: 1.875 mV
Lead Channel Sensing Intrinsic Amplitude: 12.75 mV
Lead Channel Sensing Intrinsic Amplitude: 8.625 mV
Lead Channel Setting Pacing Amplitude: 2.5 V
Lead Channel Setting Pacing Pulse Width: 0.4 ms
Lead Channel Setting Sensing Sensitivity: 4 mV
Zone Setting Status: 755011

## 2023-07-26 LAB — RESP PANEL BY RT-PCR (RSV, FLU A&B, COVID)  RVPGX2
Influenza A by PCR: NEGATIVE
Influenza B by PCR: NEGATIVE
Resp Syncytial Virus by PCR: NEGATIVE
SARS Coronavirus 2 by RT PCR: POSITIVE — AB

## 2023-07-26 LAB — PROTIME-INR
INR: 2 — ABNORMAL HIGH (ref 0.8–1.2)
Prothrombin Time: 22.8 s — ABNORMAL HIGH (ref 11.4–15.2)

## 2023-07-26 LAB — I-STAT CG4 LACTIC ACID, ED: Lactic Acid, Venous: 0.8 mmol/L (ref 0.5–1.9)

## 2023-07-26 MED ORDER — ACETAMINOPHEN 500 MG PO TABS
1000.0000 mg | ORAL_TABLET | Freq: Once | ORAL | Status: AC
Start: 2023-07-26 — End: 2023-07-26
  Administered 2023-07-26: 1000 mg via ORAL
  Filled 2023-07-26: qty 2

## 2023-07-26 MED ORDER — LACTATED RINGERS IV BOLUS (SEPSIS)
500.0000 mL | Freq: Once | INTRAVENOUS | Status: AC
  Administered 2023-07-26: 500 mL via INTRAVENOUS

## 2023-07-26 NOTE — ED Triage Notes (Signed)
 Patient coming from home with new onset of weakness and lethargy. Patient reports was taking a nap and felt a feeling of "impending doom". Patient not complaining of chest pain or SHOB. Hx of aortic valve surgery, afib, pacemaker. EMS VS 138/77 BP 95 HR 97% RA 16 RR

## 2023-07-26 NOTE — ED Provider Notes (Signed)
 [place] EMERGENCY DEPARTMENT AT [facility] Provider Note   CSN: [id number] Arrival date & time: 07/26/23  2014     History  Chief Complaint  Patient presents with   Weakness    [name] is a 89 y.o. female.  Patient presents with feeling generally weak and mild lethargy and impending doom.  Patient has no chest pain or shortness of breath.  No unilateral or stroke symptoms.  Patient fever starting today she feels very tired.  No urinary symptoms abdominal pain or chest pain.  No concerning rashes.  The history is provided by the patient.  Weakness Associated symptoms: fever   Associated symptoms: no abdominal pain, no chest pain, no dysuria, no headaches, no shortness of breath and no vomiting        Home Medications Prior to Admission medications   Medication Sig Start Date End Date Taking? Authorizing Provider  Acetaminophen  (TYLENOL  PO) Take 1 tablet by mouth as needed.    [provider]  albuterol  (VENTOLIN  HFA) 108 (90 Base) MCG/ACT inhaler Inhale 1-2 puffs into the lungs every 6 (six) hours as needed for wheezing or shortness of breath. 09/28/22   Tower, Manley Seeds, MD  bisoprolol  (ZEBETA ) 5 MG tablet Take 1 tablet (5 mg total) by mouth daily. 03/08/23   Sabharwal, Aditya, DO  ferrous sulfate  325 (65 FE) MG EC tablet Take 1 tablet (325 mg total) by mouth daily with breakfast. 06/25/23   Tower, Manley Seeds, MD  fluticasone  (FLONASE ) 50 MCG/ACT nasal spray Place 1 spray into both nostrils 2 (two) times daily as needed for allergies or rhinitis. 05/27/22   Tower, Manley Seeds, MD  furosemide  (LASIX ) 40 MG tablet Take 1 tablet (40 mg total) by mouth daily. 10/13/22 03/24/25  Sabharwal, Aditya, DO  JARDIANCE  10 MG TABS tablet TAKE 1 TABLET BY MOUTH DAILY BEFORE BREAKFAST. 05/17/23   Sabharwal, Aditya, DO  levothyroxine  (SYNTHROID ) 75 MCG tablet TAKE 1 TABLET EVERY DAY BEFORE BREAKFAST 12/29/22   Tower, Manley Seeds, MD  losartan  (COZAAR ) 25 MG tablet TAKE 1 TABLET (25 MG TOTAL)  BY MOUTH DAILY. 07/06/23   Sabharwal, Aditya, DO  mexiletine (MEXITIL) 200 MG capsule Take 1 capsule (200 mg total) by mouth 2 (two) times daily. 05/26/23   Verona Goodwill, MD  potassium chloride  SA (KLOR-CON  M) 20 MEQ tablet Take 1 tablet (20 mEq total) by mouth daily for 3 days. 12/16/22 03/24/25  Loman Risk, MD  spironolactone  (ALDACTONE ) 25 MG tablet Take 0.5 tablets (12.5 mg total) by mouth daily. 04/09/23   Verona Goodwill, MD  VITAMIN D  PO Take by mouth daily.    [provider]  warfarin (COUMADIN ) 5 MG tablet TAKE 1 TABLET DAILY EXCEPT TAKE 1/2 TABLET ON MONDAYS AND THURSDAYS OR AS DIRECTED BY COUMADIN  CLINIC 12/03/22   Tower, Manley Seeds, MD      Allergies    Amiodarone, Penicillins, Zetia  [ezetimibe ], Metoprolol , and Statins    Review of Systems   Review of Systems  Constitutional:  Positive for fatigue and fever. Negative for chills.  HENT:  Negative for congestion.   Eyes:  Negative for visual disturbance.  Respiratory:  Negative for shortness of breath.   Cardiovascular:  Negative for chest pain.  Gastrointestinal:  Negative for abdominal pain and vomiting.  Genitourinary:  Negative for dysuria and flank pain.  Musculoskeletal:  Negative for back pain, neck pain and neck stiffness.  Skin:  Negative for rash.  Neurological:  Positive for weakness. Negative for light-headedness  and headaches.    Physical Exam Updated Vital Signs BP 125/66   Pulse 71   Temp (!) 101.7 F (38.7 C)   Resp 15   SpO2 98%  Physical Exam Vitals and nursing note reviewed.  Constitutional:      General: She is not in acute distress.    Appearance: She is well-developed.  HENT:     Head: Normocephalic and atraumatic.     Mouth/Throat:     Mouth: Mucous membranes are dry.  Eyes:     General:        Right eye: No discharge.        Left eye: No discharge.     Conjunctiva/sclera: Conjunctivae normal.  Neck:     Trachea: No tracheal deviation.  Cardiovascular:     Rate and Rhythm:  Normal rate and regular rhythm.  Pulmonary:     Effort: Pulmonary effort is normal.     Breath sounds: Normal breath sounds.  Abdominal:     General: There is no distension.     Palpations: Abdomen is soft.     Tenderness: There is no abdominal tenderness. There is no guarding.  Musculoskeletal:     Cervical back: Normal range of motion and neck supple. No rigidity.  Skin:    General: Skin is warm.     Capillary Refill: Capillary refill takes less than 2 seconds.     Findings: No rash.  Neurological:     General: No focal deficit present.     Mental Status: She is alert.     Cranial Nerves: No cranial nerve deficit.  Psychiatric:        Mood and Affect: Mood normal.     ED Results / Procedures / Treatments   Labs (all labs ordered are listed, but only abnormal results are displayed) Labs Reviewed  RESP PANEL BY RT-PCR (RSV, FLU A&B, COVID)  RVPGX2 - Abnormal; Notable for the following components:      Result Value   SARS Coronavirus 2 by RT PCR POSITIVE (*)    All other components within normal limits  COMPREHENSIVE METABOLIC PANEL WITH GFR - Abnormal; Notable for the following components:   Creatinine, Ser 1.08 (*)    Calcium  8.5 (*)    GFR, Estimated 49 (*)    All other components within normal limits  CBC WITH DIFFERENTIAL/PLATELET - Abnormal; Notable for the following components:   Platelets 124 (*)    Lymphs Abs 0.4 (*)    All other components within normal limits  PROTIME-INR - Abnormal; Notable for the following components:   Prothrombin Time 22.8 (*)    INR 2.0 (*)    All other components within normal limits  CULTURE, BLOOD (ROUTINE X 2)  CULTURE, BLOOD (ROUTINE X 2)  URINALYSIS, W/ REFLEX TO CULTURE (INFECTION SUSPECTED)  I-STAT CG4 LACTIC ACID, ED  I-STAT CG4 LACTIC ACID, ED    EKG EKG Interpretation Date/Time:  Monday Jul 26 2023 20:34:52 EDT Ventricular Rate:  70 PR Interval:    QRS Duration:  186 QT Interval:  456 QTC Calculation: 493 R  Axis:   -77  Text Interpretation: Accelerated junctional rhythm Left bundle branch block Confirmed by Clay Cummins [id number]) on 07/26/2023 10:17:16 PM  Radiology DG Chest Port 1 View Result Date: 07/26/2023 CLINICAL DATA:  Questionable sepsis EXAM: PORTABLE CHEST 1 VIEW COMPARISON:  12/16/2022 FINDINGS: Left single lead pacer is unchanged. Prior median sternotomy. Heart and mediastinal contours within normal limits. Aortic atherosclerosis. No confluent airspace opacities or  effusions. No acute bony abnormality. IMPRESSION: No acute cardiopulmonary disease. Electronically Signed   By: Janeece Mechanic M.D.   On: 07/26/2023 21:44    Procedures Procedures    Medications Ordered in ED Medications  lactated ringers  bolus 500 mL (0 mLs Intravenous Stopped 07/26/23 2211)  acetaminophen  (TYLENOL ) tablet 1,000 mg (1,000 mg Oral Given 07/26/23 2117)    ED Course/ Medical Decision Making/ A&P                                 Medical Decision Making Amount and/or Complexity of Data Reviewed Labs: ordered. Radiology: ordered.  Risk OTC drugs.   Patient presents with general weakness and new fever today differential including UTI, metabolic, viral respiratory infection, pneumonia.  No abdominal signs or symptoms and no shortness of breath or chest pain to suggest cardiac etiology.  EKG reviewed independently sinus rhythm without ischemic findings.  Patient well-appearing reassessment sitting up smiling tolerating oral liquids.  Normal work of breathing.  Blood work independently reviewed overall reassuring with mild elevated creatinine 1.08, hemoglobin normal no signs of anemia, electrolytes were unremarkable.  Viral testing returned and reviewed COVID-positive which would explain symptoms.  Chest x-ray independently reviewed no infiltrate or signs of pneumonia.  Patient comfortable supportive care and outpatient follow-up Tylenol  and IV fluids and oral fluids given in the ER.        Final  Clinical Impression(s) / ED Diagnoses Final diagnoses:  COVID-19  General weakness  Fever in adult  Thrombocytopenia [facility])    Rx / DC Orders ED Discharge Orders     None         Clay Cummins, MD 07/26/23 2302

## 2023-07-26 NOTE — Discharge Instructions (Addendum)
 Use Tylenol  every 4 as needed for fever or pain.  Stay well-hydrated.  Follow-up with your doctor as needed.  Return for shortness of breath or new concerns. Your platelet levels are mildly decreased likely secondary to COVID/viral infection.

## 2023-07-29 ENCOUNTER — Telehealth: Payer: Self-pay

## 2023-07-29 ENCOUNTER — Ambulatory Visit

## 2023-07-29 NOTE — Telephone Encounter (Signed)
 Pt had RS her coumadin  clinic apt today. Review of chart shows pt was in ER on 5/5 and diagnosed with COVID.  Tried to contact pt to inquire how she is doing. No answer and no VM on either number.

## 2023-07-30 NOTE — Telephone Encounter (Signed)
 Tried to contact pt again but VM is full on cell number and no VM on home number.

## 2023-07-31 LAB — CULTURE, BLOOD (ROUTINE X 2)
Culture: NO GROWTH
Culture: NO GROWTH
Special Requests: ADEQUATE

## 2023-08-02 NOTE — Telephone Encounter (Signed)
 Contacted pt who reports she has been very tired but overall is feeling much better since going to the ER.  Advised pt she should have INR checked this week. Pt agreed. Coumadin  clinic apt cancelled next week and added to this week. Advised if any changes to contact the coumadin  clinic. Pt  verbalized understanding.

## 2023-08-05 ENCOUNTER — Ambulatory Visit (INDEPENDENT_AMBULATORY_CARE_PROVIDER_SITE_OTHER)

## 2023-08-05 DIAGNOSIS — Z7901 Long term (current) use of anticoagulants: Secondary | ICD-10-CM

## 2023-08-05 LAB — POCT INR: INR: 4.3 — AB (ref 2.0–3.0)

## 2023-08-05 NOTE — Patient Instructions (Addendum)
 Pre visit review using our clinic review tool, if applicable. No additional management support is needed unless otherwise documented below in the visit note.  Hold dose today and then change weekly dose to take 1 tablet daily except take 1/2 tablet on Sunday and Wednesday . Recheck in 1 weeks.

## 2023-08-05 NOTE — Progress Notes (Signed)
 In ER on 5/5 and diagnosed with COVID. Nothing prescribed. Pt denies any other changes. Hold dose today and then change weekly dose to take 1 tablet daily except take 1/2 tablet on Sunday and Wednesday . Recheck in 1 weeks.

## 2023-08-12 ENCOUNTER — Ambulatory Visit

## 2023-08-12 ENCOUNTER — Ambulatory Visit (INDEPENDENT_AMBULATORY_CARE_PROVIDER_SITE_OTHER)

## 2023-08-12 DIAGNOSIS — Z7901 Long term (current) use of anticoagulants: Secondary | ICD-10-CM

## 2023-08-12 LAB — POCT INR: INR: 3.7 — AB (ref 2.0–3.0)

## 2023-08-12 NOTE — Progress Notes (Signed)
 Pt denies any changes. Pt already took warfarin today. Hold dose tomorrow and then change weekly dose to take 1 tablet daily except take 1/2 tablet on Monday, Wednesday and Friday . Recheck in 3 weeks.

## 2023-08-12 NOTE — Patient Instructions (Addendum)
 Pre visit review using our clinic review tool, if applicable. No additional management support is needed unless otherwise documented below in the visit note.  Hold dose tomorrow and then change weekly dose to take 1 tablet daily except take 1/2 tablet on Monday, Wednesday and Friday . Recheck in 3 weeks.

## 2023-08-20 ENCOUNTER — Telehealth: Payer: Self-pay | Admitting: Internal Medicine

## 2023-08-20 NOTE — Telephone Encounter (Signed)
 Spoke with patient and suggested that she reach out to the [facility] for her most recent updated medication list.  She stated that the clinic was near her home and she thanked me for that suggestion.

## 2023-08-20 NOTE — Telephone Encounter (Signed)
 Pt requesting a list of her current medications and she doesn't know how to use MyChart.

## 2023-08-28 ENCOUNTER — Other Ambulatory Visit: Payer: Self-pay | Admitting: Internal Medicine

## 2023-08-28 ENCOUNTER — Other Ambulatory Visit: Payer: Self-pay | Admitting: Family Medicine

## 2023-08-31 ENCOUNTER — Telehealth: Payer: Self-pay | Admitting: Internal Medicine

## 2023-08-31 NOTE — Telephone Encounter (Signed)
*  STAT* If patient is at the pharmacy, call can be transferred to refill team.   1. Which medications need to be refilled? (please list name of each medication and dose if known)   mexiletine (MEXITIL) 200 MG capsule   2. Would you like to learn more about the convenience, safety, & potential cost savings by using the [facility]?   3. Are you open to using the [facility] (Type [facility]. ).  4. Which pharmacy/location (including street and city if local pharmacy) is medication to be sent to?  [id number] - [place],  - [address]   5. Do they need a 30 day or 90 day supply?   90 day  Patient stated she is completely out of this medication.

## 2023-08-31 NOTE — Telephone Encounter (Signed)
Pt's medication has already been sent to pt's pharmacy as requested. Confirmation received.  

## 2023-09-02 ENCOUNTER — Ambulatory Visit: Payer: Self-pay | Admitting: Family Medicine

## 2023-09-02 ENCOUNTER — Ambulatory Visit (INDEPENDENT_AMBULATORY_CARE_PROVIDER_SITE_OTHER)
Admission: RE | Admit: 2023-09-02 | Discharge: 2023-09-02 | Disposition: A | Discharge status: Home / Self Care | Source: Ambulatory Visit | Attending: Family Medicine | Admitting: Family Medicine

## 2023-09-02 ENCOUNTER — Ambulatory Visit (INDEPENDENT_AMBULATORY_CARE_PROVIDER_SITE_OTHER): Admitting: Family Medicine

## 2023-09-02 ENCOUNTER — Ambulatory Visit

## 2023-09-02 ENCOUNTER — Encounter: Payer: Self-pay | Admitting: Family Medicine

## 2023-09-02 VITALS — BP 124/76 | HR 74 | Temp 97.9°F | Ht 65.0 in | Wt 175.2 lb

## 2023-09-02 DIAGNOSIS — Z950 Presence of cardiac pacemaker: Secondary | ICD-10-CM | POA: Diagnosis not present

## 2023-09-02 DIAGNOSIS — Z7901 Long term (current) use of anticoagulants: Secondary | ICD-10-CM | POA: Diagnosis not present

## 2023-09-02 DIAGNOSIS — R079 Chest pain, unspecified: Secondary | ICD-10-CM | POA: Diagnosis not present

## 2023-09-02 DIAGNOSIS — R0789 Other chest pain: Secondary | ICD-10-CM | POA: Insufficient documentation

## 2023-09-02 DIAGNOSIS — R053 Chronic cough: Secondary | ICD-10-CM

## 2023-09-02 LAB — POCT INR: INR: 1.9 — AB (ref 2.0–3.0)

## 2023-09-02 MED ORDER — FAMOTIDINE 20 MG PO TABS: 20.0000 mg | ORAL_TABLET | Freq: Two times a day (BID) | ORAL | 1 refills | Status: DC

## 2023-09-02 NOTE — Progress Notes (Signed)
 Subjective:    Patient ID: [name], female    DOB: 05-17-1932, 89 y.o.   MRN: [id number]  HPI  Wt Readings from Last 3 Encounters:  09/02/23 175 lb 4 oz (79.5 kg)  03/25/23 173 lb 6 oz (78.6 kg)  03/22/23 174 lb (78.9 kg)   29.16 kg/m  Vitals:   09/02/23 0831  BP: 124/76  Pulse: 74  Temp: 97.9 F (36.6 C)  SpO2: 97%    Pt presents for c/o  burning sensation in esophagus Heart, hypothyroid , mood (dep/anx) Had covid 19 in may   Wakes up in the night with esophageal burning Uses liquid lidocaine  - helps briefly so she can go back to sleep  Feels like acid  Discomfort on right throat  Constant throat clearing    (also pnd)  Does not cough a lot-occational a bit of phlegm  No regurgitation   No n/v  Not a lot of belching   No abd pain  No constipation  Bms are fluid / soft    Has not tried any products for acid reflux   Eats light  (less appetite)  Not late  Does not take nsaids   Has history of iron  def  Lab Results  Component Value Date   WBC 4.7 07/26/2023   HGB 12.3 07/26/2023   HCT 38.6 07/26/2023   MCV 92.8 07/26/2023   PLT 124 (L) 07/26/2023   Lab Results  Component Value Date   IRON  67 03/25/2023   TIBC 350 12/28/2018   FERRITIN 107.2 07/03/2022     Has cardiomyopalthy with a fib- takes bioprolol 5 mg daily and lasix  40 mg daily and jardiance  10 mg daily  Also losartan  25 mg daily  Spironolactone  12.5 mg daily  Warfarin  BP Readings from Last 3 Encounters:  09/02/23 124/76  07/26/23 120/68  03/25/23 134/82   Pulse Readings from Last 3 Encounters:  09/02/23 74  07/26/23 70  03/25/23 88   Does drink a lot of water  Urinates frequently  Hypothyroidism  Pt has no clinical changes No change in energy level/ hair or skin/ edema and no tremor Lab Results  Component Value Date   TSH 3.69 03/25/2023    Levothyroxine  75 mg daily    Mood    09/02/2023    9:26 AM 03/22/2023    8:35 AM 03/18/2022   11:12 AM 11/06/2021     8:25 AM 06/06/2021    9:17 AM  Depression screen PHQ 2/9  Decreased Interest 0 0 0 0 2  Down, Depressed, Hopeless 0 1 0 1 1  PHQ - 2 Score 0 1 0 1 3  Altered sleeping     1  Tired, decreased energy     1  Change in appetite     0  Feeling bad or failure about yourself      2  Trouble concentrating     3  Moving slowly or fidgety/restless     0  Suicidal thoughts     0  PHQ-9 Score     10  Difficult doing work/chores     Somewhat difficult      09/02/2023    9:26 AM  GAD 7 : Generalized Anxiety Score  Nervous, Anxious, on Edge 0  Control/stop worrying 0  Worry too much - different things 0  Trouble relaxing 0  Restless 0  Easily annoyed or irritable 0  Afraid - awful might happen 0  Total GAD 7 Score 0  Depression with anxiety with brain fog  In past disliked counseling  Glucose Lab Results  Component Value Date   HGBA1C 6.0 03/25/2023   HGBA1C 5.8 07/03/2022   HGBA1C 6.0 03/12/2022    DG Chest 2 View Result Date: 09/02/2023 CLINICAL DATA:  Right-sided chest pain at night. EXAM: CHEST - 2 VIEW COMPARISON:  Jul 26, 2023 FINDINGS: The heart size and mediastinal contours are within normal limits. Single lead left-sided pacemaker is again noted. Sternotomy wires are noted. Both lungs are clear. The visualized skeletal structures are unremarkable. IMPRESSION: No active cardiopulmonary disease. Electronically Signed   By: Rosalene Colon M.D.   On: 09/02/2023 09:51       Patient Active Problem List   Diagnosis Date Noted   Burning chest pain 09/02/2023   Presence of heart assist device (HCC) 03/25/2023   Cardiomyopathy, unspecified (HCC) 03/08/2023   Acute systolic heart failure (HCC) 09/21/2022   Nonrheumatic mitral valve regurgitation 09/21/2022   HFrEF (heart failure with reduced ejection fraction) (HCC) 09/16/2022   Abnormal urinalysis 07/09/2022   Encounter for screening mammogram for breast cancer 03/19/2022   PND (post-nasal drip) 12/11/2021   Other fatigue  10/08/2021   Salt craving 06/25/2021   Brain fog 01/14/2021   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/20/2020   Facial paresthesia 10/14/2020   Grief reaction 09/20/2020   Hearing loss 06/13/2020   Atrial fibrillation (HCC) 04/24/2020   Routine general medical examination at a health care facility 03/11/2020   Elevated glucose 03/03/2020   History of TIA (transient ischemic attack) 02/18/2020   S/P AV nodal ablation 07/28/19 07/29/2019   S/P placement of cardiac pacemaker MDT 07/28/19 07/29/2019   AV block 07/28/2019   History of CVA (cerebrovascular accident) 06/16/2019   Facial tingling 11/29/2018   Tremor of left hand 11/29/2018   Medicare annual wellness visit, subsequent 11/24/2018   Dizzy 02/04/2018   Iron  deficiency anemia 10/21/2017   Rapid atrial fibrillation (HCC) 08/19/2017   Constipation 08/02/2017   Unilateral primary osteoarthritis, left hip 05/04/2017   Status post total replacement of left hip 05/04/2017   Hip osteoarthritis 04/27/2017   Long term (current) use of anticoagulants 03/04/2017   Venous stasis dermatitis of both lower extremities 01/08/2017   Osteopenia 10/25/2016   Pedal edema 08/26/2016   Varicose veins of both lower extremities 08/26/2016   Estrogen deficiency 08/26/2016   Hemorrhoids 08/26/2016   History of nonmelanoma skin cancer 01/01/2016   Urticaria 08/22/2014   Hip pain 08/02/2014   Left knee pain 08/02/2014   Chronic cough 05/08/2014   Hematochezia 04/09/2014   Colon cancer screening 08/16/2013   Fatigue 08/16/2013   Encounter for therapeutic drug monitoring 04/20/2013   Left ovarian cyst 03/14/2013   (HFpEF) heart failure with preserved ejection fraction (HCC) 12/27/2012   History of colonic polyps 09/18/2009   PULMONARY NODULE 12/20/2008   Ganglion 10/04/2007   Mixed incontinence 04/28/2007   Hyperlipidemia 04/27/2007   Depression with anxiety 04/27/2007   Asthma, mild intermittent 04/27/2007   Insomnia 04/27/2007   ADENOMATOUS COLONIC POLYP  11/04/2006   Hypothyroidism 09/02/2006   Atrial fibrillation, chronic (HCC) 08/05/2006   Past Medical History:  Diagnosis Date   Allergic rhinitis    Alopecia 2/2 beta blockers    Anemia    Arthritis    Atrial fibrillation -persistent cardiologist-  dr klein/  primary EP -- dr Veda Gerald ([facility])   a. s/p PVI [name] 2010;  b. on tikosyn /coumadin ;  c. 05/2009 Echo: EF 60-65%, Gr 2 DD. (first dx  09/ 2007)   Bilateral lower extremity edema    Bleeding hemorrhoid    Carotid stenosis    mild (hosp 3/11)- consult by vasc/ Dr Shirley Douglas   Complication of anesthesia    hard to wake   Diverticulosis of colon    Dyspnea    on exertion-climbing stairs   Fatty liver    H/O cardiac radiofrequency ablation    01/ 2008 at [facility]  /  03/ 2010  at [facility]   Heart failure with preserved ejection fraction [facility])    History of adenomatous polyp of colon    tubular adenoma's   History of cardiomyopathy    secondary tachycardia-induced cardiomyopathy -- resolved 2014   History of squamous cell carcinoma in situ (SCCIS) of skin    05/ 2017  nasal bridge and right medial knee   History of transient ischemic attack (TIA)    01-24-2005 and 06-12-2009   Hyperlipidemia    Hypothyroidism    Mild intermittent asthma    reacts to cats   Mixed stress and urge urinary incontinence    Presence of permanent cardiac pacemaker    was put in 07/2019   Pulmonary nodule    S/P AV nodal ablation 07/28/19 07/29/2019   S/P mitral valve repair 10-23-1998  dr Marvetta Sloan at [facility]   for MVP and regurg. (annuloplasty ring procedure)   S/P placement of cardiac pacemaker MDT 07/28/19 07/29/2019   Past Surgical History:  Procedure Laterality Date   APPENDECTOMY  1978   AV NODE ABLATION N/A 07/28/2019   Procedure: AV NODE ABLATION;  Surgeon: Verona Goodwill, MD;  Location[facility] INVASIVE CV LAB;  Service: Cardiovascular;  Laterality: N/A;   BUBBLE STUDY  06/19/2019   Procedure: BUBBLE STUDY;  Surgeon: Hazle Lites, MD;  Location: [facility] ENDOSCOPY;  Service: Cardiovascular;;   CARDIAC ELECTROPHYSIOLOGY MAPPING AND ABLATION  01/ 2008    at [facility]    right-sided ablation atrial flutter   CARDIAC ELECTROPHYSIOLOGY STUDY AND ABLATION  03/ 2010   dr Melany Spiro at [facility]   AV node ablation and pulmonary vein isolation for atrial fib   CARDIOVERSION  06-18-2006;  07-13-2006;  10-19-2010;  10-27-2010   CATARACT EXTRACTION W/PHACO Right 04/09/2021   Procedure: CATARACT EXTRACTION PHACO AND INTRAOCULAR LENS PLACEMENT (IOC) RIGHT 6.71 01:11.1;  Surgeon: Annell Kidney, MD;  Location: [facility] SURGERY CNTR;  Service: Ophthalmology;  Laterality: Right;   CATARACT EXTRACTION W/PHACO Left 04/23/2021   Procedure: CATARACT EXTRACTION PHACO AND INTRAOCULAR LENS PLACEMENT (IOC) LEFT;  Surgeon: Annell Kidney, MD;  Location: [facility] SURGERY CNTR;  Service: Ophthalmology;  Laterality: Left;  [place] 5.65 00:50.1   COLONOSCOPY     COLONOSCOPY WITH PROPOFOL  N/A 10/13/2017   Procedure: COLONOSCOPY WITH PROPOFOL ;  Surgeon: Luke Salaam, MD;  Location: [facility] ENDOSCOPY;  Service: Gastroenterology;  Laterality: N/A;   COLONOSCOPY WITH PROPOFOL  N/A 02/20/2020   Procedure: COLONOSCOPY WITH PROPOFOL ;  Surgeon: Shane Darling, MD;  Location: [facility] ENDOSCOPY;  Service: Endoscopy;  Laterality: N/A;   CYSTO/ TRANSURETHRAL COLLAGEN INJECTION THERAPY  07-26-2007   dr Clarke Crouch   DILATION AND CURETTAGE OF UTERUS     ESOPHAGOGASTRODUODENOSCOPY (EGD) WITH PROPOFOL  N/A 10/13/2017   Procedure: ESOPHAGOGASTRODUODENOSCOPY (EGD) WITH PROPOFOL ;  Surgeon: Luke Salaam, MD;  Location: [facility] ENDOSCOPY;  Service: Gastroenterology;  Laterality: N/A;   EVALUATION UNDER ANESTHESIA WITH HEMORRHOIDECTOMY N/A 04/09/2020   Procedure: EXAM UNDER ANESTHESIA WITH HEMORRHOIDECTOMY;  Surgeon: Alben Alma, MD;  Location: [facility] ORS;  Service: General;  Laterality: N/A;  EXCISIONAL HEMORRHOIDECTOMY  1980s   GIVENS CAPSULE STUDY N/A 12/08/2017    Procedure: GIVENS CAPSULE STUDY;  Surgeon: Luke Salaam, MD;  Location: [facility] ENDOSCOPY;  Service: Gastroenterology;  Laterality: N/A;   HEMORRHOID SURGERY N/A 10/29/2016   Procedure: HEMORRHOIDECTOMY;  Surgeon: Joyce Nixon, MD;  Location: [facility];  Service: General;  Laterality: N/A;   MITRAL VALVE ANNULOPLASTY  10/23/1998   Model [id number]; Rande Bushy [id number]; size 32mm; [facility]; Dr. Marvetta Sloan   PACEMAKER IMPLANT N/A 07/28/2019   Procedure: PACEMAKER IMPLANT;  Surgeon: Verona Goodwill, MD;  Location: [facility] INVASIVE CV LAB;  Service: Cardiovascular;  Laterality: N/A;   PILONIDAL CYST EXCISION  1954   RIGHT/LEFT HEART CATH AND CORONARY ANGIOGRAPHY Bilateral 09/21/2022   Procedure: RIGHT/LEFT HEART CATH AND CORONARY ANGIOGRAPHY;  Surgeon: Wenona Hamilton, MD;  Location: [facility] INVASIVE CV LAB;  Service: Cardiovascular;  Laterality: Bilateral;   TEE WITH CARDIOVERSION  05-06-2006 at [facility];  01-02-2013 at [facility]   TEE WITHOUT CARDIOVERSION N/A 06/19/2019   Procedure: TRANSESOPHAGEAL ECHOCARDIOGRAM (TEE);  Surgeon: Hazle Lites, MD;  Location: [facility] ENDOSCOPY;  Service: Cardiovascular;  Laterality: N/A;   TOTAL HIP ARTHROPLASTY Left 05/04/2017   Procedure: LEFT TOTAL HIP ARTHROPLASTY ANTERIOR APPROACH;  Surgeon: Arnie Lao, MD;  Location: [facility] OR;  Service: Orthopedics;  Laterality: Left;   TRANSTHORACIC ECHOCARDIOGRAM  05-01-2015   dr Rodolfo Clan   ef 50-55%/  mild AV sclerosis without stenosis/  post MV repair with mild central MR (valve area by pressure half-time 2cm^2,  valve area by continutity equation 0.91cm^2, peak grandiant 46mmHg)/  severe LAE/ mild TR/ mild RAE    TUBAL LIGATION Bilateral 1978   Social History   Tobacco Use   Smoking status: Never   Smokeless tobacco: Never  Vaping Use   Vaping status: Never Used  Substance Use Topics   Alcohol  use: Not Currently    Comment: seldom   Drug use: No   Family History  Problem Relation Age of Onset   Lung cancer Father         smoker, died at 95   Alcohol  abuse Father    Cancer Father        bladder and lung CA smoker   Sudden death Other    Breast cancer Neg Hx    Stroke Neg Hx    Allergies  Allergen Reactions   Amiodarone Swelling    SWELLING REACTION UNSPECIFIED    Penicillins Hives, Rash and Other (See Comments)    Has patient had a PCN reaction causing immediate rash, facial/tongue/throat swelling, SOB or lightheadedness with hypotension: No  Has patient had a PCN reaction causing severe rash involving mucus membranes or skin necrosis: No  Has patient had a PCN reaction that required hospitalization:Patient was inpatient when reaction occurred  Has patient had a PCN reaction occurring within the last 10 years: No  If all of the above answers are NO, then may proceed with Cephalosporin use   Other Other (See Comments)   Zetia  [Ezetimibe ]    Metoprolol  Other (See Comments)    Hair loss   Statins Rash    REACTION: rash   Current Outpatient Medications on File Prior to Visit  Medication Sig Dispense Refill   Acetaminophen  (TYLENOL  PO) Take 1 tablet by mouth as needed.     albuterol  (VENTOLIN  HFA) 108 (90 Base) MCG/ACT inhaler Inhale 1-2 puffs into the lungs every 6 (six) hours as needed for wheezing or shortness of breath. 18 g 2   bisoprolol  (  ZEBETA ) 5 MG tablet Take 1 tablet (5 mg total) by mouth daily. 30 tablet 11   clindamycin  (CLEOCIN ) 150 MG capsule SMARTSIG:4 Capsule(s) By Mouth     diphenhydrAMINE -zinc  acetate (BENADRYL ) cream Apply topically.     ferrous sulfate  325 (65 FE) MG EC tablet Take 1 tablet (325 mg total) by mouth daily with breakfast. 90 tablet 2   fluticasone  (FLONASE ) 50 MCG/ACT nasal spray Place 1 spray into both nostrils 2 (two) times daily as needed for allergies or rhinitis. 16 g 11   furosemide  (LASIX ) 40 MG tablet Take 1 tablet (40 mg total) by mouth daily. 90 tablet 3   JARDIANCE  10 MG TABS tablet TAKE 1 TABLET BY MOUTH DAILY BEFORE BREAKFAST. 30 tablet 6    levothyroxine  (SYNTHROID ) 75 MCG tablet TAKE 1 TABLET EVERY DAY BEFORE BREAKFAST 90 tablet 1   losartan  (COZAAR ) 25 MG tablet TAKE 1 TABLET (25 MG TOTAL) BY MOUTH DAILY. 90 tablet 3   mexiletine (MEXITIL) 200 MG capsule TAKE 1 CAPSULE BY MOUTH TWICE A DAY 180 capsule 1   potassium chloride  SA (KLOR-CON  M) 20 MEQ tablet Take 1 tablet (20 mEq total) by mouth daily for 3 days. 3 tablet 0   spironolactone  (ALDACTONE ) 25 MG tablet Take 0.5 tablets (12.5 mg total) by mouth daily. 45 tablet 3   VITAMIN D  PO Take by mouth daily.     warfarin (COUMADIN ) 5 MG tablet TAKE 1 TABLET DAILY EXCEPT TAKE 1/2 TABLET ON MONDAYS AND THURSDAYS OR AS DIRECTED BY COUMADIN  CLINIC 105 tablet 1   No current facility-administered medications on file prior to visit.    Review of Systems  Constitutional:  Negative for activity change, appetite change, fatigue, fever and unexpected weight change.  HENT:  Positive for sore throat. Negative for congestion, ear pain, rhinorrhea, sinus pressure and trouble swallowing.        Throat clearing   Eyes:  Negative for pain, redness and visual disturbance.  Respiratory:  Positive for cough. Negative for shortness of breath and wheezing.   Cardiovascular:  Negative for chest pain and palpitations.  Gastrointestinal:  Negative for abdominal pain, blood in stool, constipation and diarrhea.       Burning chest pain-per pt feels like esophagus    Endocrine: Negative for polydipsia and polyuria.  Genitourinary:  Negative for dysuria, frequency and urgency.  Musculoskeletal:  Negative for arthralgias, back pain and myalgias.  Skin:  Negative for pallor and rash.  Allergic/Immunologic: Negative for environmental allergies.  Neurological:  Negative for dizziness, syncope and headaches.  Hematological:  Negative for adenopathy. Does not bruise/bleed easily.  Psychiatric/Behavioral:  Positive for dysphoric mood. Negative for decreased concentration. The patient is nervous/anxious.         Objective:   Physical Exam Constitutional:      General: She is not in acute distress.    Appearance: Normal appearance. She is well-developed. She is obese. She is not ill-appearing or diaphoretic.  HENT:     Head: Normocephalic and atraumatic.     Nose: Nose normal.     Mouth/Throat:     Mouth: Mucous membranes are moist.     Pharynx: Oropharynx is clear. No oropharyngeal exudate or posterior oropharyngeal erythema.   Eyes:     General: No scleral icterus.    Conjunctiva/sclera: Conjunctivae normal.     Pupils: Pupils are equal, round, and reactive to light.    Cardiovascular:     Rate and Rhythm: Normal rate and regular rhythm.  Heart sounds: Normal heart sounds.  Pulmonary:     Effort: Pulmonary effort is normal. No respiratory distress.     Breath sounds: Normal breath sounds. No stridor. No wheezing, rhonchi or rales.  Chest:     Chest wall: No tenderness.  Abdominal:     General: Bowel sounds are normal. There is no distension.     Palpations: Abdomen is soft. There is no mass.     Tenderness: There is no abdominal tenderness. There is no right CVA tenderness, left CVA tenderness, guarding or rebound.   Musculoskeletal:     Cervical back: Normal range of motion and neck supple.     Right lower leg: No edema.     Left lower leg: No edema.  Lymphadenopathy:     Cervical: No cervical adenopathy.   Skin:    General: Skin is warm and dry.     Coloration: Skin is not pale.     Findings: No erythema.   Neurological:     Mental Status: She is alert.     Cranial Nerves: No cranial nerve deficit.     Motor: No weakness.   Psychiatric:        Attention and Perception: Attention normal.        Mood and Affect: Mood is anxious.        Speech: Speech normal.        Behavior: Behavior normal.     Comments: Pleasant  Candidly discusses symptoms and stressors    Mildly anxious            Assessment & Plan:   Problem List Items Addressed This Visit        Other   Chronic cough   Sensation of pnd/globus ? If possible gerd  Will try famotidine        Burning chest pain - Primary   At night/ atypical  Center to right  Per pt feels like acid  Also throat clearing/occational cough  Discussed possible GERD  Reassuring exam Reassuring cxr Will treat with pepcid  20 mg bid  Encouraged to call if not improved Otherwise follow up 4-6 wk /earlier if needed  Handout given  Call back and Er precautions noted in detail today         Relevant Orders   DG Chest 2 View (Completed)

## 2023-09-02 NOTE — Addendum Note (Signed)
 Addended by: Lott Rouleau A on: 09/02/2023 08:38 AM   Modules accepted: Orders

## 2023-09-02 NOTE — Patient Instructions (Addendum)
 Pre visit review using our clinic review tool, if applicable. No additional management support is needed unless otherwise documented below in the visit note.  Increase dose today to take 1 1/2 tablets and then continue 1 tablet daily except take 1/2 tablet on Monday, Wednesday and Friday . Recheck in 2 weeks.

## 2023-09-02 NOTE — Patient Instructions (Addendum)
 You may have acid reflux causing some of the night time chest burning  This can also worsen the throat clearing symptoms    Try pepcid  20 mg twice daily to see if it helps   Let's also check a chest xray  We will reach out with results   Avoid eating late (I know you don't anyway)   Follow up 4-6 weeks  In symptoms worsen in the meantime call and let us  know   If severe -then go to the ER

## 2023-09-02 NOTE — Assessment & Plan Note (Signed)
 At night/ atypical  Center to right  Per pt feels like acid  Also throat clearing/occational cough  Discussed possible GERD  Reassuring exam Reassuring cxr Will treat with pepcid  20 mg bid  Encouraged to call if not improved Otherwise follow up 4-6 wk /earlier if needed  Handout given  Call back and Er precautions noted in detail today

## 2023-09-02 NOTE — Progress Notes (Signed)
 Remote pacemaker transmission.

## 2023-09-02 NOTE — Progress Notes (Signed)
 Pt also has apt with PCP today. Increase dose today to take 1 1/2 tablets and then continue 1 tablet daily except take 1/2 tablet on Monday, Wednesday and Friday . Recheck in 2 weeks.

## 2023-09-02 NOTE — Assessment & Plan Note (Signed)
 Sensation of pnd/globus ? If possible gerd  Will try famotidine 

## 2023-09-02 NOTE — Telephone Encounter (Signed)
 Copied from CRM [id number]. Topic: Clinical - Lab/Test Results >> Sep 02, 2023  3:54 PM [place] A wrote: Reason for CRM: Patient was returning call for Nurse-Agent read message Dr.Tower left verbatim but patient would like to speak to nurse

## 2023-09-05 ENCOUNTER — Other Ambulatory Visit: Payer: Self-pay | Admitting: Cardiology

## 2023-09-16 ENCOUNTER — Ambulatory Visit

## 2023-09-16 DIAGNOSIS — Z7901 Long term (current) use of anticoagulants: Secondary | ICD-10-CM | POA: Diagnosis not present

## 2023-09-16 LAB — POCT INR: INR: 2.5 (ref 2.0–3.0)

## 2023-09-16 NOTE — Patient Instructions (Addendum)
 Pre visit review using our clinic review tool, if applicable. No additional management support is needed unless otherwise documented below in the visit note.  Continue 1 tablet daily except take 1/2 tablet on Monday, Wednesday and Friday . Recheck in 4 weeks.

## 2023-09-16 NOTE — Progress Notes (Signed)
 Continue 1 tablet daily except take 1/2 tablet on Monday, Wednesday and Friday . Recheck in 4 weeks.

## 2023-10-05 ENCOUNTER — Inpatient Hospital Stay (HOSPITAL_COMMUNITY)
Admission: EM | Admit: 2023-10-05 | Discharge: 2023-10-08 | DRG: 291 | Disposition: A | Discharge status: Home / Self Care | Attending: Internal Medicine | Admitting: Internal Medicine

## 2023-10-05 ENCOUNTER — Encounter (HOSPITAL_COMMUNITY): Payer: Self-pay | Admitting: Emergency Medicine

## 2023-10-05 ENCOUNTER — Emergency Department (HOSPITAL_COMMUNITY)

## 2023-10-05 ENCOUNTER — Other Ambulatory Visit: Payer: Self-pay

## 2023-10-05 DIAGNOSIS — R001 Bradycardia, unspecified: Secondary | ICD-10-CM | POA: Diagnosis not present

## 2023-10-05 DIAGNOSIS — E669 Obesity, unspecified: Secondary | ICD-10-CM | POA: Diagnosis present

## 2023-10-05 DIAGNOSIS — I428 Other cardiomyopathies: Secondary | ICD-10-CM | POA: Diagnosis not present

## 2023-10-05 DIAGNOSIS — Z79899 Other long term (current) drug therapy: Secondary | ICD-10-CM

## 2023-10-05 DIAGNOSIS — Z860101 Personal history of adenomatous and serrated colon polyps: Secondary | ICD-10-CM

## 2023-10-05 DIAGNOSIS — R4182 Altered mental status, unspecified: Principal | ICD-10-CM

## 2023-10-05 DIAGNOSIS — R410 Disorientation, unspecified: Secondary | ICD-10-CM | POA: Diagnosis not present

## 2023-10-05 DIAGNOSIS — R630 Anorexia: Secondary | ICD-10-CM | POA: Diagnosis present

## 2023-10-05 DIAGNOSIS — E785 Hyperlipidemia, unspecified: Secondary | ICD-10-CM | POA: Diagnosis not present

## 2023-10-05 DIAGNOSIS — I959 Hypotension, unspecified: Secondary | ICD-10-CM | POA: Diagnosis not present

## 2023-10-05 DIAGNOSIS — F419 Anxiety disorder, unspecified: Secondary | ICD-10-CM | POA: Diagnosis present

## 2023-10-05 DIAGNOSIS — R200 Anesthesia of skin: Secondary | ICD-10-CM | POA: Diagnosis present

## 2023-10-05 DIAGNOSIS — R0989 Other specified symptoms and signs involving the circulatory and respiratory systems: Secondary | ICD-10-CM | POA: Diagnosis not present

## 2023-10-05 DIAGNOSIS — Z8673 Personal history of transient ischemic attack (TIA), and cerebral infarction without residual deficits: Secondary | ICD-10-CM | POA: Diagnosis not present

## 2023-10-05 DIAGNOSIS — Z86008 Personal history of in-situ neoplasm of other site: Secondary | ICD-10-CM

## 2023-10-05 DIAGNOSIS — Z1152 Encounter for screening for COVID-19: Secondary | ICD-10-CM

## 2023-10-05 DIAGNOSIS — Z880 Allergy status to penicillin: Secondary | ICD-10-CM

## 2023-10-05 DIAGNOSIS — I4821 Permanent atrial fibrillation: Secondary | ICD-10-CM | POA: Diagnosis present

## 2023-10-05 DIAGNOSIS — Z9841 Cataract extraction status, right eye: Secondary | ICD-10-CM

## 2023-10-05 DIAGNOSIS — G9389 Other specified disorders of brain: Secondary | ICD-10-CM | POA: Diagnosis not present

## 2023-10-05 DIAGNOSIS — E039 Hypothyroidism, unspecified: Secondary | ICD-10-CM | POA: Diagnosis present

## 2023-10-05 DIAGNOSIS — I5033 Acute on chronic diastolic (congestive) heart failure: Principal | ICD-10-CM | POA: Diagnosis present

## 2023-10-05 DIAGNOSIS — N3000 Acute cystitis without hematuria: Principal | ICD-10-CM

## 2023-10-05 DIAGNOSIS — N390 Urinary tract infection, site not specified: Secondary | ICD-10-CM

## 2023-10-05 DIAGNOSIS — J4520 Mild intermittent asthma, uncomplicated: Secondary | ICD-10-CM | POA: Diagnosis present

## 2023-10-05 DIAGNOSIS — Z7984 Long term (current) use of oral hypoglycemic drugs: Secondary | ICD-10-CM | POA: Diagnosis not present

## 2023-10-05 DIAGNOSIS — Z950 Presence of cardiac pacemaker: Secondary | ICD-10-CM | POA: Diagnosis not present

## 2023-10-05 DIAGNOSIS — Z888 Allergy status to other drugs, medicaments and biological substances status: Secondary | ICD-10-CM

## 2023-10-05 DIAGNOSIS — I509 Heart failure, unspecified: Secondary | ICD-10-CM

## 2023-10-05 DIAGNOSIS — Z6829 Body mass index (BMI) 29.0-29.9, adult: Secondary | ICD-10-CM | POA: Diagnosis not present

## 2023-10-05 DIAGNOSIS — G9341 Metabolic encephalopathy: Secondary | ICD-10-CM | POA: Diagnosis not present

## 2023-10-05 DIAGNOSIS — Z79890 Hormone replacement therapy (postmenopausal): Secondary | ICD-10-CM

## 2023-10-05 DIAGNOSIS — Z9842 Cataract extraction status, left eye: Secondary | ICD-10-CM

## 2023-10-05 DIAGNOSIS — J811 Chronic pulmonary edema: Secondary | ICD-10-CM | POA: Diagnosis not present

## 2023-10-05 DIAGNOSIS — R202 Paresthesia of skin: Secondary | ICD-10-CM | POA: Diagnosis present

## 2023-10-05 DIAGNOSIS — I5030 Unspecified diastolic (congestive) heart failure: Secondary | ICD-10-CM | POA: Diagnosis present

## 2023-10-05 DIAGNOSIS — I482 Chronic atrial fibrillation, unspecified: Secondary | ICD-10-CM | POA: Diagnosis present

## 2023-10-05 DIAGNOSIS — Z961 Presence of intraocular lens: Secondary | ICD-10-CM | POA: Diagnosis present

## 2023-10-05 DIAGNOSIS — J320 Chronic maxillary sinusitis: Secondary | ICD-10-CM | POA: Diagnosis present

## 2023-10-05 DIAGNOSIS — F32A Depression, unspecified: Secondary | ICD-10-CM | POA: Diagnosis not present

## 2023-10-05 DIAGNOSIS — Z7901 Long term (current) use of anticoagulants: Secondary | ICD-10-CM | POA: Diagnosis not present

## 2023-10-05 DIAGNOSIS — R0602 Shortness of breath: Secondary | ICD-10-CM | POA: Diagnosis not present

## 2023-10-05 DIAGNOSIS — Z801 Family history of malignant neoplasm of trachea, bronchus and lung: Secondary | ICD-10-CM

## 2023-10-05 DIAGNOSIS — I10 Essential (primary) hypertension: Secondary | ICD-10-CM | POA: Diagnosis not present

## 2023-10-05 DIAGNOSIS — Z811 Family history of alcohol abuse and dependence: Secondary | ICD-10-CM

## 2023-10-05 DIAGNOSIS — Z96642 Presence of left artificial hip joint: Secondary | ICD-10-CM | POA: Diagnosis present

## 2023-10-05 DIAGNOSIS — I110 Hypertensive heart disease with heart failure: Secondary | ICD-10-CM | POA: Diagnosis not present

## 2023-10-05 LAB — URINALYSIS, ROUTINE W REFLEX MICROSCOPIC
Bacteria, UA: NONE SEEN
Bilirubin Urine: NEGATIVE
Glucose, UA: >=500 mg/dL — AB
Ketones, ur: NEGATIVE mg/dL
Nitrite: NEGATIVE
Protein, ur: 30 mg/dL — AB
Specific Gravity, Urine: 1.024 (ref 1.005–1.030)
pH: 5 (ref 5.0–8.0)

## 2023-10-05 LAB — CBC
HCT: 41.6 % (ref 36.0–46.0)
Hemoglobin: 13.2 g/dL (ref 12.0–15.0)
MCH: 29.5 pg (ref 26.0–34.0)
MCHC: 31.7 g/dL (ref 30.0–36.0)
MCV: 93.1 fL (ref 80.0–100.0)
Platelets: 155 K/uL (ref 150–400)
RBC: 4.47 MIL/uL (ref 3.87–5.11)
RDW: 15 % (ref 11.5–15.5)
WBC: 4.5 K/uL (ref 4.0–10.5)
nRBC: 0 % (ref 0.0–0.2)

## 2023-10-05 LAB — COMPREHENSIVE METABOLIC PANEL WITH GFR
ALT: 13 U/L (ref 0–44)
AST: 22 U/L (ref 15–41)
Albumin: 3.8 g/dL (ref 3.5–5.0)
Alkaline Phosphatase: 38 U/L (ref 38–126)
Anion gap: 9 (ref 5–15)
BUN: 23 mg/dL (ref 8–23)
CO2: 25 mmol/L (ref 22–32)
Calcium: 9.2 mg/dL (ref 8.9–10.3)
Chloride: 106 mmol/L (ref 98–111)
Creatinine, Ser: 1.04 mg/dL — ABNORMAL HIGH (ref 0.44–1.00)
GFR, Estimated: 52 mL/min — ABNORMAL LOW (ref >60)
Glucose, Bld: 108 mg/dL — ABNORMAL HIGH (ref 70–99)
Potassium: 3.9 mmol/L (ref 3.5–5.1)
Sodium: 140 mmol/L (ref 135–145)
Total Bilirubin: 1 mg/dL (ref 0.0–1.2)
Total Protein: 7.1 g/dL (ref 6.5–8.1)

## 2023-10-05 LAB — CBG MONITORING, ED: Glucose-Capillary: 109 mg/dL — ABNORMAL HIGH (ref 70–99)

## 2023-10-05 NOTE — ED Triage Notes (Addendum)
 Patient BIB EMS for feeling confused.  Unable to describe what is causing her to feel confused.  Lives alone.  Alert and oriented x 4.  States something just feels very wrong and I needed to come to the hospital. VSS for EMS.  130/66, HR 88, 94% CBG 128  Pt reports she felt disoriented at home for a brief amount of time.  States I have a heart condition and I was afraid that something would happen to me and I would not be able to get a hold of anyone.  No reports of pain.  No chest pain.  Appears nervous

## 2023-10-06 ENCOUNTER — Inpatient Hospital Stay (HOSPITAL_COMMUNITY)

## 2023-10-06 ENCOUNTER — Encounter (HOSPITAL_COMMUNITY): Payer: Self-pay | Admitting: Internal Medicine

## 2023-10-06 DIAGNOSIS — N3000 Acute cystitis without hematuria: Secondary | ICD-10-CM | POA: Diagnosis present

## 2023-10-06 DIAGNOSIS — Z7901 Long term (current) use of anticoagulants: Secondary | ICD-10-CM | POA: Diagnosis not present

## 2023-10-06 DIAGNOSIS — I428 Other cardiomyopathies: Secondary | ICD-10-CM | POA: Diagnosis present

## 2023-10-06 DIAGNOSIS — Z961 Presence of intraocular lens: Secondary | ICD-10-CM | POA: Diagnosis present

## 2023-10-06 DIAGNOSIS — I509 Heart failure, unspecified: Secondary | ICD-10-CM | POA: Diagnosis not present

## 2023-10-06 DIAGNOSIS — R4182 Altered mental status, unspecified: Secondary | ICD-10-CM | POA: Diagnosis not present

## 2023-10-06 DIAGNOSIS — Z6829 Body mass index (BMI) 29.0-29.9, adult: Secondary | ICD-10-CM | POA: Diagnosis not present

## 2023-10-06 DIAGNOSIS — Z86008 Personal history of in-situ neoplasm of other site: Secondary | ICD-10-CM | POA: Diagnosis not present

## 2023-10-06 DIAGNOSIS — Z8673 Personal history of transient ischemic attack (TIA), and cerebral infarction without residual deficits: Secondary | ICD-10-CM | POA: Diagnosis not present

## 2023-10-06 DIAGNOSIS — R630 Anorexia: Secondary | ICD-10-CM | POA: Diagnosis present

## 2023-10-06 DIAGNOSIS — Z7984 Long term (current) use of oral hypoglycemic drugs: Secondary | ICD-10-CM | POA: Diagnosis not present

## 2023-10-06 DIAGNOSIS — J4520 Mild intermittent asthma, uncomplicated: Secondary | ICD-10-CM | POA: Diagnosis present

## 2023-10-06 DIAGNOSIS — Z950 Presence of cardiac pacemaker: Secondary | ICD-10-CM | POA: Diagnosis not present

## 2023-10-06 DIAGNOSIS — I4821 Permanent atrial fibrillation: Secondary | ICD-10-CM | POA: Diagnosis present

## 2023-10-06 DIAGNOSIS — G9340 Encephalopathy, unspecified: Secondary | ICD-10-CM

## 2023-10-06 DIAGNOSIS — G9389 Other specified disorders of brain: Secondary | ICD-10-CM | POA: Diagnosis not present

## 2023-10-06 DIAGNOSIS — Z1152 Encounter for screening for COVID-19: Secondary | ICD-10-CM | POA: Diagnosis not present

## 2023-10-06 DIAGNOSIS — Z96642 Presence of left artificial hip joint: Secondary | ICD-10-CM | POA: Diagnosis present

## 2023-10-06 DIAGNOSIS — I482 Chronic atrial fibrillation, unspecified: Secondary | ICD-10-CM | POA: Diagnosis not present

## 2023-10-06 DIAGNOSIS — F32A Depression, unspecified: Secondary | ICD-10-CM | POA: Diagnosis present

## 2023-10-06 DIAGNOSIS — Z79890 Hormone replacement therapy (postmenopausal): Secondary | ICD-10-CM | POA: Diagnosis not present

## 2023-10-06 DIAGNOSIS — I480 Paroxysmal atrial fibrillation: Secondary | ICD-10-CM

## 2023-10-06 DIAGNOSIS — E039 Hypothyroidism, unspecified: Secondary | ICD-10-CM | POA: Diagnosis present

## 2023-10-06 DIAGNOSIS — Z888 Allergy status to other drugs, medicaments and biological substances status: Secondary | ICD-10-CM | POA: Diagnosis not present

## 2023-10-06 DIAGNOSIS — G9341 Metabolic encephalopathy: Secondary | ICD-10-CM | POA: Diagnosis present

## 2023-10-06 DIAGNOSIS — N390 Urinary tract infection, site not specified: Secondary | ICD-10-CM | POA: Diagnosis present

## 2023-10-06 DIAGNOSIS — Z880 Allergy status to penicillin: Secondary | ICD-10-CM | POA: Diagnosis not present

## 2023-10-06 DIAGNOSIS — I5022 Chronic systolic (congestive) heart failure: Secondary | ICD-10-CM

## 2023-10-06 DIAGNOSIS — I5033 Acute on chronic diastolic (congestive) heart failure: Secondary | ICD-10-CM | POA: Diagnosis present

## 2023-10-06 DIAGNOSIS — Z860101 Personal history of adenomatous and serrated colon polyps: Secondary | ICD-10-CM | POA: Diagnosis not present

## 2023-10-06 DIAGNOSIS — E669 Obesity, unspecified: Secondary | ICD-10-CM | POA: Diagnosis present

## 2023-10-06 DIAGNOSIS — E785 Hyperlipidemia, unspecified: Secondary | ICD-10-CM | POA: Diagnosis present

## 2023-10-06 LAB — ECHOCARDIOGRAM COMPLETE
Calc EF: 60.8 %
Height: 65 in
MV VTI: 0.98 cm2
S' Lateral: 3.5 cm
Single Plane A2C EF: 64.6 %
Single Plane A4C EF: 55.2 %
Weight: 2800 [oz_av]

## 2023-10-06 LAB — BASIC METABOLIC PANEL WITH GFR
Anion gap: 13 (ref 5–15)
BUN: 20 mg/dL (ref 8–23)
CO2: 28 mmol/L (ref 22–32)
Calcium: 9 mg/dL (ref 8.9–10.3)
Chloride: 101 mmol/L (ref 98–111)
Creatinine, Ser: 0.94 mg/dL (ref 0.44–1.00)
GFR, Estimated: 58 mL/min — ABNORMAL LOW (ref >60)
Glucose, Bld: 91 mg/dL (ref 70–99)
Potassium: 4.2 mmol/L (ref 3.5–5.1)
Sodium: 142 mmol/L (ref 135–145)

## 2023-10-06 LAB — RESP PANEL BY RT-PCR (RSV, FLU A&B, COVID)  RVPGX2
Influenza A by PCR: NEGATIVE
Influenza B by PCR: NEGATIVE
Resp Syncytial Virus by PCR: NEGATIVE
SARS Coronavirus 2 by RT PCR: NEGATIVE

## 2023-10-06 LAB — BLOOD CULTURE ID PANEL (REFLEXED) - BCID2

## 2023-10-06 LAB — CBC
HCT: 38.9 % (ref 36.0–46.0)
Hemoglobin: 12.7 g/dL (ref 12.0–15.0)
MCH: 29.6 pg (ref 26.0–34.0)
MCHC: 32.6 g/dL (ref 30.0–36.0)
MCV: 90.7 fL (ref 80.0–100.0)
Platelets: 146 K/uL — ABNORMAL LOW (ref 150–400)
RBC: 4.29 MIL/uL (ref 3.87–5.11)
RDW: 15.1 % (ref 11.5–15.5)
WBC: 4 K/uL (ref 4.0–10.5)
nRBC: 0 % (ref 0.0–0.2)

## 2023-10-06 LAB — BRAIN NATRIURETIC PEPTIDE: B Natriuretic Peptide: 155.1 pg/mL — ABNORMAL HIGH (ref 0.0–100.0)

## 2023-10-06 LAB — PROTIME-INR
INR: 1.7 — ABNORMAL HIGH (ref 0.8–1.2)
Prothrombin Time: 20.8 s — ABNORMAL HIGH (ref 11.4–15.2)

## 2023-10-06 LAB — MAGNESIUM: Magnesium: 2.2 mg/dL (ref 1.7–2.4)

## 2023-10-06 LAB — PHOSPHORUS: Phosphorus: 3.3 mg/dL (ref 2.5–4.6)

## 2023-10-06 MED ORDER — FERROUS SULFATE 325 (65 FE) MG PO TABS
325.0000 mg | ORAL_TABLET | Freq: Every day | ORAL | Status: DC
  Administered 2023-10-07 – 2023-10-08 (×2): 325 mg via ORAL
  Filled 2023-10-06 (×3): qty 1

## 2023-10-06 MED ORDER — PROCHLORPERAZINE EDISYLATE 10 MG/2ML IJ SOLN: 5.0000 mg | Freq: Four times a day (QID) | INTRAMUSCULAR | Status: DC | PRN

## 2023-10-06 MED ORDER — ONDANSETRON HCL 4 MG PO TABS
4.0000 mg | ORAL_TABLET | Freq: Four times a day (QID) | ORAL | Status: DC | PRN
  Filled 2023-10-06 (×4): qty 1

## 2023-10-06 MED ORDER — LOSARTAN POTASSIUM 25 MG PO TABS
25.0000 mg | ORAL_TABLET | Freq: Every day | ORAL | Status: DC
  Administered 2023-10-06 – 2023-10-08 (×3): 25 mg via ORAL
  Filled 2023-10-06: qty 1
  Filled 2023-10-06: qty 0.5
  Filled 2023-10-06 (×3): qty 1

## 2023-10-06 MED ORDER — LEVOTHYROXINE SODIUM 75 MCG PO TABS
75.0000 ug | ORAL_TABLET | Freq: Every day | ORAL | Status: DC
  Administered 2023-10-06 – 2023-10-08 (×3): 75 ug via ORAL
  Filled 2023-10-06 (×4): qty 1

## 2023-10-06 MED ORDER — SODIUM CHLORIDE 0.9 % IV SOLN
2.0000 g | Freq: Once | INTRAVENOUS | Status: AC
  Administered 2023-10-06: 2 g via INTRAVENOUS
  Filled 2023-10-06: qty 10

## 2023-10-06 MED ORDER — SODIUM CHLORIDE 0.9 % IV SOLN
1.0000 g | INTRAVENOUS | Status: DC
  Administered 2023-10-06 – 2023-10-07 (×2): 1 g via INTRAVENOUS
  Filled 2023-10-06 (×4): qty 10

## 2023-10-06 MED ORDER — ACETAMINOPHEN 325 MG PO TABS
650.0000 mg | ORAL_TABLET | Freq: Four times a day (QID) | ORAL | Status: DC | PRN
Start: 2023-10-06 — End: 2023-10-08
  Administered 2023-10-07: 650 mg via ORAL
  Filled 2023-10-06 (×5): qty 2

## 2023-10-06 MED ORDER — FUROSEMIDE 10 MG/ML IJ SOLN
40.0000 mg | Freq: Once | INTRAMUSCULAR | Status: AC
  Administered 2023-10-06: 40 mg via INTRAVENOUS
  Filled 2023-10-06: qty 4

## 2023-10-06 MED ORDER — ACETAMINOPHEN 325 MG PO TABS: 650.0000 mg | ORAL_TABLET | Freq: Four times a day (QID) | ORAL | Status: DC | PRN

## 2023-10-06 MED ORDER — SODIUM CHLORIDE 0.9 % IV SOLN: 2.0000 g | Freq: Two times a day (BID) | INTRAVENOUS | Status: DC

## 2023-10-06 MED ORDER — MEXILETINE HCL 200 MG PO CAPS
200.0000 mg | ORAL_CAPSULE | Freq: Two times a day (BID) | ORAL | Status: DC
  Administered 2023-10-06 – 2023-10-08 (×4): 200 mg via ORAL
  Filled 2023-10-06 (×9): qty 1

## 2023-10-06 MED ORDER — MELATONIN 5 MG PO TABS
5.0000 mg | ORAL_TABLET | Freq: Every evening | ORAL | Status: DC | PRN
  Filled 2023-10-06: qty 1

## 2023-10-06 MED ORDER — SPIRONOLACTONE 12.5 MG HALF TABLET
12.5000 mg | ORAL_TABLET | Freq: Every day | ORAL | Status: DC
  Administered 2023-10-07 – 2023-10-08 (×2): 12.5 mg via ORAL
  Filled 2023-10-06 (×4): qty 1

## 2023-10-06 MED ORDER — BISOPROLOL FUMARATE 5 MG PO TABS
5.0000 mg | ORAL_TABLET | Freq: Every day | ORAL | Status: DC
  Administered 2023-10-07 – 2023-10-08 (×2): 5 mg via ORAL
  Filled 2023-10-06 (×3): qty 1

## 2023-10-06 MED ORDER — POLYETHYLENE GLYCOL 3350 17 G PO PACK
17.0000 g | PACK | Freq: Every day | ORAL | Status: DC | PRN
  Filled 2023-10-06: qty 1

## 2023-10-06 MED ORDER — WARFARIN - PHARMACIST DOSING INPATIENT: Freq: Every day | Status: DC

## 2023-10-06 MED ORDER — WARFARIN SODIUM 7.5 MG PO TABS
7.5000 mg | ORAL_TABLET | Freq: Once | ORAL | Status: AC
  Administered 2023-10-06: 7.5 mg via ORAL
  Filled 2023-10-06 (×2): qty 1

## 2023-10-06 MED ORDER — FUROSEMIDE 40 MG PO TABS
40.0000 mg | ORAL_TABLET | Freq: Every day | ORAL | Status: DC
  Administered 2023-10-07 – 2023-10-08 (×2): 40 mg via ORAL
  Filled 2023-10-06: qty 1
  Filled 2023-10-06: qty 2
  Filled 2023-10-06: qty 1

## 2023-10-06 NOTE — Progress Notes (Signed)
 Inbound call received from [facility]. Representative reported needing support with process to report positive blood culture results to pharmacy or provider. Representative provided name and phone number of night APP. APP updated and microbiology phone number was also given. See provider notifications.

## 2023-10-06 NOTE — ED Provider Notes (Signed)
 [place] EMERGENCY DEPARTMENT AT [facility] Provider Note   CSN: [id number] Arrival date & time: 10/05/23  2148     Patient presents with: Altered Mental Status   [name] is a 89 y.o. female.   The history is provided by the EMS personnel. The history is limited by the condition of the patient.  Altered Mental Status Presenting symptoms: confusion   Severity:  Severe Most recent episode:  Today Episode history:  Single Timing:  Constant Progression:  Unchanged Chronicity:  New Context: not dementia   Associated symptoms: no abdominal pain, normal movement, no agitation, no bladder incontinence, no decreased appetite and no fever        Prior to Admission medications   Medication Sig Start Date End Date Taking? Authorizing Provider  Acetaminophen  (TYLENOL  PO) Take 1 tablet by mouth as needed.    [provider]  albuterol  (VENTOLIN  HFA) 108 (90 Base) MCG/ACT inhaler Inhale 1-2 puffs into the lungs every 6 (six) hours as needed for wheezing or shortness of breath. 09/28/22   Tower, Laine LABOR, MD  bisoprolol  (ZEBETA ) 5 MG tablet Take 1 tablet (5 mg total) by mouth daily. 03/08/23   Sabharwal, Ria, DO  clindamycin  (CLEOCIN ) 150 MG capsule SMARTSIG:4 Capsule(s) By Mouth 04/28/23   [provider]  diphenhydrAMINE -zinc  acetate (BENADRYL ) cream Apply topically.    [provider]  famotidine  (PEPCID ) 20 MG tablet Take 1 tablet (20 mg total) by mouth 2 (two) times daily. 09/02/23   Tower, Laine LABOR, MD  ferrous sulfate  325 (65 FE) MG EC tablet Take 1 tablet (325 mg total) by mouth daily with breakfast. 06/25/23   Tower, Laine LABOR, MD  fluticasone  (FLONASE ) 50 MCG/ACT nasal spray Place 1 spray into both nostrils 2 (two) times daily as needed for allergies or rhinitis. 05/27/22   Tower, Laine LABOR, MD  furosemide  (LASIX ) 40 MG tablet Take 1 tablet (40 mg total) by mouth daily. 10/13/22 03/24/25  Sabharwal, Aditya, DO  JARDIANCE  10 MG TABS tablet TAKE 1 TABLET  BY MOUTH DAILY BEFORE BREAKFAST. 05/17/23   Sabharwal, Aditya, DO  levothyroxine  (SYNTHROID ) 75 MCG tablet TAKE 1 TABLET EVERY DAY BEFORE BREAKFAST 12/29/22   Tower, Laine LABOR, MD  losartan  (COZAAR ) 25 MG tablet TAKE 1 TABLET (25 MG TOTAL) BY MOUTH DAILY. 07/06/23   Sabharwal, Aditya, DO  mexiletine (MEXITIL ) 200 MG capsule TAKE 1 CAPSULE BY MOUTH TWICE A DAY 08/31/23   Fernande Elspeth BROCKS, MD  potassium chloride  SA (KLOR-CON  M) 20 MEQ tablet Take 1 tablet (20 mEq total) by mouth daily for 3 days. 12/16/22 03/24/25  Angelena Smalls, MD  spironolactone  (ALDACTONE ) 25 MG tablet Take 0.5 tablets (12.5 mg total) by mouth daily. 04/09/23   Fernande Elspeth BROCKS, MD  VITAMIN D  PO Take by mouth daily.    [provider]  warfarin (COUMADIN ) 5 MG tablet TAKE 1 TABLET DAILY EXCEPT TAKE 1/2 TABLET ON MONDAYS AND THURSDAYS OR AS DIRECTED BY COUMADIN  CLINIC 12/03/22   Tower, Laine LABOR, MD    Allergies: Amiodarone, Penicillins, Other, Zetia  [ezetimibe ], Metoprolol , and Statins    Review of Systems  Unable to perform ROS: Mental status change  Constitutional:  Negative for decreased appetite and fever.  HENT:  Negative for facial swelling.   Respiratory:  Negative for wheezing and stridor.   Cardiovascular:  Negative for chest pain.  Gastrointestinal:  Negative for abdominal pain.  Genitourinary:  Negative for bladder incontinence.  Psychiatric/Behavioral:  Positive for confusion. Negative for agitation.  Updated Vital Signs BP (!) 146/69 (BP Location: Right Arm)   Pulse 78   Temp 98.6 F (37 C) (Oral)   Resp 17   Ht 5' 5 (1.651 m)   Wt 79.4 kg   SpO2 97%   BMI 29.12 kg/m   Physical Exam Vitals and nursing note reviewed.  Constitutional:      General: She is not in acute distress.    Appearance: Normal appearance. She is well-developed.  HENT:     Head: Normocephalic and atraumatic.     Nose: Nose normal.  Eyes:     Pupils: Pupils are equal, round, and reactive to light.  Cardiovascular:      Rate and Rhythm: Normal rate and regular rhythm.     Pulses: Normal pulses.     Heart sounds: Normal heart sounds.  Pulmonary:     Effort: No respiratory distress.     Breath sounds: Rhonchi present.  Abdominal:     General: Bowel sounds are normal. There is no distension.     Palpations: Abdomen is soft.     Tenderness: There is no abdominal tenderness. There is no guarding or rebound.  Musculoskeletal:        General: Normal range of motion.     Cervical back: Normal range of motion and neck supple.  Skin:    Findings: No erythema or rash.  Neurological:     Mental Status: She is alert.     Deep Tendon Reflexes: Reflexes normal.  Psychiatric:        Mood and Affect: Mood normal.     (all labs ordered are listed, but only abnormal results are displayed) Results for orders placed or performed during the hospital encounter of 10/05/23  Urinalysis, Routine w reflex microscopic -Urine, Clean Catch   Collection Time: 10/05/23 10:08 PM  Result Value Ref Range   Color, Urine YELLOW YELLOW   APPearance HAZY (A) CLEAR   Specific Gravity, Urine 1.024 1.005 - 1.030   pH 5.0 5.0 - 8.0   Glucose, UA >=500 (A) NEGATIVE mg/dL   Hgb urine dipstick MODERATE (A) NEGATIVE   Bilirubin Urine NEGATIVE NEGATIVE   Ketones, ur NEGATIVE NEGATIVE mg/dL   Protein, ur 30 (A) NEGATIVE mg/dL   Nitrite NEGATIVE NEGATIVE   Leukocytes,Ua MODERATE (A) NEGATIVE   RBC / HPF 0-5 0 - 5 RBC/hpf   WBC, UA 21-50 0 - 5 WBC/hpf   Bacteria, UA NONE SEEN NONE SEEN   Squamous Epithelial / HPF 0-5 0 - 5 /HPF  CBG monitoring, ED   Collection Time: 10/05/23 10:12 PM  Result Value Ref Range   Glucose-Capillary 109 (H) 70 - 99 mg/dL  Comprehensive metabolic panel   Collection Time: 10/05/23 10:14 PM  Result Value Ref Range   Sodium 140 135 - 145 mmol/L   Potassium 3.9 3.5 - 5.1 mmol/L   Chloride 106 98 - 111 mmol/L   CO2 25 22 - 32 mmol/L   Glucose, Bld 108 (H) 70 - 99 mg/dL   BUN 23 8 - 23 mg/dL   Creatinine,  Ser 8.95 (H) 0.44 - 1.00 mg/dL   Calcium  9.2 8.9 - 10.3 mg/dL   Total Protein 7.1 6.5 - 8.1 g/dL   Albumin  3.8 3.5 - 5.0 g/dL   AST 22 15 - 41 U/L   ALT 13 0 - 44 U/L   Alkaline Phosphatase 38 38 - 126 U/L   Total Bilirubin 1.0 0.0 - 1.2 mg/dL   GFR, Estimated 52 (L) >60  mL/min   Anion gap 9 5 - 15  CBC   Collection Time: 10/05/23 10:14 PM  Result Value Ref Range   WBC 4.5 4.0 - 10.5 K/uL   RBC 4.47 3.87 - 5.11 MIL/uL   Hemoglobin 13.2 12.0 - 15.0 g/dL   HCT 58.3 63.9 - 53.9 %   MCV 93.1 80.0 - 100.0 fL   MCH 29.5 26.0 - 34.0 pg   MCHC 31.7 30.0 - 36.0 g/dL   RDW 84.9 88.4 - 84.4 %   Platelets 155 150 - 400 K/uL   nRBC 0.0 0.0 - 0.2 %  Resp panel by RT-PCR (RSV, Flu A&B, Covid) Anterior Nasal Swab   Collection Time: 10/05/23 11:50 PM   Specimen: Anterior Nasal Swab  Result Value Ref Range   SARS Coronavirus 2 by RT PCR NEGATIVE NEGATIVE   Influenza A by PCR NEGATIVE NEGATIVE   Influenza B by PCR NEGATIVE NEGATIVE   Resp Syncytial Virus by PCR NEGATIVE NEGATIVE   *Note: Due to a large number of results and/or encounters for the requested time period, some results have not been displayed. A complete set of results can be found in Results Review.   DG Chest Portable 1 View Result Date: 10/06/2023 CLINICAL DATA:  Shortness of breath EXAM: PORTABLE CHEST 1 VIEW COMPARISON:  09/02/2023 FINDINGS: Cardiac shadow is enlarged. Pacing device is again seen. Postsurgical changes are noted. Increased central vascular congestion is noted with interstitial edema. No focal infiltrate is noted. No bony abnormality is seen. IMPRESSION: Changes of mild CHF. Electronically Signed   By: Oneil Devonshire M.D.   On: 10/06/2023 00:15    Radiology: DG Chest Portable 1 View Result Date: 10/06/2023 CLINICAL DATA:  Shortness of breath EXAM: PORTABLE CHEST 1 VIEW COMPARISON:  09/02/2023 FINDINGS: Cardiac shadow is enlarged. Pacing device is again seen. Postsurgical changes are noted. Increased central vascular  congestion is noted with interstitial edema. No focal infiltrate is noted. No bony abnormality is seen. IMPRESSION: Changes of mild CHF. Electronically Signed   By: Oneil Devonshire M.D.   On: 10/06/2023 00:15     Procedures   Medications Ordered in the ED  aztreonam  (AZACTAM ) 2 g in sodium chloride  0.9 % 100 mL IVPB (2 g Intravenous New Bag/Given 10/06/23 0108)  furosemide  (LASIX ) injection 40 mg (40 mg Intravenous Given 10/06/23 0104)                                    Medical Decision Making Patient with AMS  Amount and/or Complexity of Data Reviewed Independent Historian: EMS    Details: Previous notes reviewed  External Data Reviewed: notes.    Details: Previous notes  Labs: ordered.    Details: Negative covid and flu.  Normal sodium 140, normal potassium 3.9, creatinine slight elevation 1.04, normal white count 4.5, normal hemoglobin 13.4  Radiology: ordered and independent interpretation performed.    Details: CHF by me on CXR   Risk Prescription drug management. Decision regarding hospitalization.    Final diagnoses:  Altered mental status, unspecified altered mental status type  Congestive heart failure, unspecified HF chronicity, unspecified heart failure type (HCC)  Urinary tract infection without hematuria, site unspecified   The patient appears reasonably stabilized for admission considering the current resources, flow, and capabilities available in the ED at this time, and I doubt any other [facility] requiring further screening and/or treatment in the ED prior to admission.  ED Discharge Orders     None          Deklyn Gibbon, MD 10/06/23 0127

## 2023-10-06 NOTE — Progress Notes (Signed)
 PHARMACY - ANTICOAGULATION CONSULT NOTE  Pharmacy Consult for warfarin Indication: atrial fibrillation  Allergies  Allergen Reactions   Amiodarone Swelling    SWELLING REACTION UNSPECIFIED    Penicillins Hives, Rash and Other (See Comments)    Has patient had a PCN reaction causing immediate rash, facial/tongue/throat swelling, SOB or lightheadedness with hypotension: No  Has patient had a PCN reaction causing severe rash involving mucus membranes or skin necrosis: No  Has patient had a PCN reaction that required hospitalization:Patient was inpatient when reaction occurred  Has patient had a PCN reaction occurring within the last 10 years: No  If all of the above answers are NO, then may proceed with Cephalosporin use   Other Other (See Comments)   Zetia  [Ezetimibe ]    Metoprolol  Other (See Comments)    Hair loss   Statins Rash    REACTION: rash    Patient Measurements: Height: 5' 5 (165.1 cm) Weight: 79.4 kg (175 lb) IBW/kg (Calculated) : 57 HEPARIN  DW (KG): 73.7  Vital Signs: Temp: 98.8 F (37.1 C) (07/16 0653) Temp Source: Oral (07/16 0653) BP: 137/67 (07/16 0930) Pulse Rate: 67 (07/16 0930)  Labs: Recent Labs    10/05/23 2214 10/06/23 0730  HGB 13.2 12.7  HCT 41.6 38.9  PLT 155 146*  LABPROT  --  20.8*  INR  --  1.7*  CREATININE 1.04* 0.94    Estimated Creatinine Clearance: 43.1 mL/min (by C-G formula based on SCr of 0.94 mg/dL).   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 s/p PVI [name] 2010;  b. on tikosyn /coumadin ;  c. 05/2009 Echo: EF 60-65%, Gr 2 DD. (first dx 09/ 2007)   Bilateral lower extremity edema    Bleeding hemorrhoid    Carotid stenosis    mild (hosp 3/11)- consult by vasc/ Dr Oris   Complication of anesthesia    hard to wake    Diverticulosis of colon    Dyspnea    on exertion-climbing stairs   Fatty liver    H/O cardiac radiofrequency ablation    01/ 2008 at [facility] [place]  /  03/ 2010  at [facility]   Heart failure with preserved ejection fraction [facility])    History of adenomatous polyp of colon    tubular adenoma's   History of cardiomyopathy    secondary tachycardia-induced cardiomyopathy -- resolved 2014   History of squamous cell carcinoma in situ (SCCIS) of skin    05/ 2017  nasal bridge and right medial knee   History of transient ischemic attack (TIA)    01-24-2005 and 06-12-2009   Hyperlipidemia    Hypothyroidism    Mild intermittent asthma    reacts to cats   Mixed stress and urge urinary incontinence    Presence of permanent cardiac pacemaker    was put in 07/2019   Pulmonary nodule    S/P AV nodal ablation 07/28/19 07/29/2019   S/P mitral valve repair 10-23-1998  dr lansing at [facility]   for MVP and regurg. (annuloplasty ring procedure)   S/P placement of cardiac pacemaker MDT 07/28/19 07/29/2019     Assessment: 89yoF on warfarin prior to admission for atrial fibrillation. Patient currently admitted with altered mental status likely 2/2 to UTI. INR 1.7 with last dose reported 7/15. The  provider is ok holding off bridge therapy at this time. Pharmacy consulted to manage warfarin dosing.   Reported PTA regimen: 2.5mg  MWF, 5mg  all other days   Goal of Therapy:  INR 2-3 Monitor platelets by anticoagulation protocol: Yes   Plan:  Warfarin 7.5mg  x1 tonight Daily INR Monitor for s/sx of bleeding  Koren Or, PharmD Clinical Pharmacist 10/06/2023 10:10 AM Please check AMION for all [facility] Pharmacy numbers

## 2023-10-06 NOTE — Progress Notes (Signed)
 2155-- Pharmacy notified of dose of Mexitil  given at 1727 and due again at 2200 and pending provider notification and instruction.  2158 --Pharmacist notified of provider response to hold 2200 dose of Mexitil  200mg  for tonight.

## 2023-10-06 NOTE — H&P (Signed)
 History and Physical  [name] FMW:[id number] DOB: Aug 14, 1932 DOA: 10/05/2023  Referring physician: Dr. Nettie, EDP  PCP: Randeen Laine LABOR, MD  Outpatient Specialists: Cardiology. Patient coming from: Home Chief Complaint: Altered mental status.  HPI: [name] is a 89 y.o. female with medical history significant for persistent A-fib on Coumadin , hypothyroidism, who presents to the ER due to confusion today.  Denies having any abdominal pain or diarrhea.  No reported fevers or chills.  In the ER, UA positive for pyuria.  Chest x-ray with changes of mild CHF.  BNP mildly elevated in the setting of obesity.  Started on aztreonam  in the ER due to severe penicillin allergy.  Also received a dose of IV Lasix  40 mg x 1.  Admitted by [facility], hospitalist service.  ED Course: Temperature 98.6.  BP 122/60, pulse 71, respiratory rate 12, O2 saturation 100% on room air.  Review of Systems: Review of systems as noted in the HPI. All other systems reviewed and are negative.   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 s/p PVI [name] 2010;  b. on tikosyn /coumadin ;  c. 05/2009 Echo: EF 60-65%, Gr 2 DD. (first dx 09/ 2007)   Bilateral lower extremity edema    Bleeding hemorrhoid    Carotid stenosis    mild (hosp 3/11)- consult by vasc/ Dr Oris   Complication of anesthesia    hard to wake   Diverticulosis of colon    Dyspnea    on exertion-climbing stairs   Fatty liver    H/O cardiac radiofrequency ablation    01/ 2008 at [facility] [place]  /  03/ 2010  at [facility]   Heart failure with preserved ejection fraction [facility])    History of adenomatous polyp of colon    tubular adenoma's   History of cardiomyopathy    secondary tachycardia-induced cardiomyopathy -- resolved 2014   History of squamous cell carcinoma in situ  (SCCIS) of skin    05/ 2017  nasal bridge and right medial knee   History of transient ischemic attack (TIA)    01-24-2005 and 06-12-2009   Hyperlipidemia    Hypothyroidism    Mild intermittent asthma    reacts to cats   Mixed stress and urge urinary incontinence    Presence of permanent cardiac pacemaker    was put in 07/2019   Pulmonary nodule    S/P AV nodal ablation 07/28/19 07/29/2019   S/P mitral valve repair 10-23-1998  dr lansing at [facility]   for MVP and regurg. (annuloplasty ring procedure)   S/P placement of cardiac pacemaker MDT 07/28/19 07/29/2019   Past Surgical History:  Procedure Laterality Date   APPENDECTOMY  1978   AV NODE ABLATION N/A 07/28/2019   Procedure: AV NODE ABLATION;  Surgeon: Fernande Elspeth BROCKS, MD;  Location: [facility] INVASIVE CV LAB;  Service: Cardiovascular;  Laterality: N/A;   BUBBLE STUDY  06/19/2019   Procedure: BUBBLE STUDY;  Surgeon: Mona Vinie BROCKS, MD;  Location: [facility] ENDOSCOPY;  Service: Cardiovascular;;   CARDIAC ELECTROPHYSIOLOGY MAPPING AND ABLATION  01/ 2008    at [facility] [place]    right-sided ablation atrial flutter   CARDIAC ELECTROPHYSIOLOGY STUDY AND ABLATION  03/ 2010   dr ogles at [facility]   AV node ablation and pulmonary  vein isolation for atrial fib   CARDIOVERSION  06-18-2006;  07-13-2006;  10-19-2010;  10-27-2010   CATARACT EXTRACTION W/PHACO Right 04/09/2021   Procedure: CATARACT EXTRACTION PHACO AND INTRAOCULAR LENS PLACEMENT (IOC) RIGHT 6.71 01:11.1;  Surgeon: Mittie Gaskin, MD;  Location: [facility];  Service: Ophthalmology;  Laterality: Right;   CATARACT EXTRACTION W/PHACO Left 04/23/2021   Procedure: CATARACT EXTRACTION PHACO AND INTRAOCULAR LENS PLACEMENT (IOC) LEFT;  Surgeon: Mittie Gaskin, MD;  Location: [facility] SURGERY CNTR;  Service: Ophthalmology;  Laterality: Left;  [place] 5.65 00:50.1   COLONOSCOPY     COLONOSCOPY WITH PROPOFOL  N/A 10/13/2017   Procedure: COLONOSCOPY WITH PROPOFOL ;  Surgeon: Therisa Bi, MD;  Location: [facility] ENDOSCOPY;  Service: Gastroenterology;  Laterality: N/A;   COLONOSCOPY WITH PROPOFOL  N/A 02/20/2020   Procedure: COLONOSCOPY WITH PROPOFOL ;  Surgeon: Maryruth Ole DASEN, MD;  Location: [facility] ENDOSCOPY;  Service: Endoscopy;  Laterality: N/A;   CYSTO/ TRANSURETHRAL COLLAGEN INJECTION THERAPY  07-26-2007   dr gaston   DILATION AND CURETTAGE OF UTERUS     ESOPHAGOGASTRODUODENOSCOPY (EGD) WITH PROPOFOL  N/A 10/13/2017   Procedure: ESOPHAGOGASTRODUODENOSCOPY (EGD) WITH PROPOFOL ;  Surgeon: Therisa Bi, MD;  Location: [facility] ENDOSCOPY;  Service: Gastroenterology;  Laterality: N/A;   EVALUATION UNDER ANESTHESIA WITH HEMORRHOIDECTOMY N/A 04/09/2020   Procedure: EXAM UNDER ANESTHESIA WITH HEMORRHOIDECTOMY;  Surgeon: Jordis Laneta FALCON, MD;  Location: [facility] ORS;  Service: General;  Laterality: N/A;   EXCISIONAL HEMORRHOIDECTOMY  1980s   GIVENS CAPSULE STUDY N/A 12/08/2017   Procedure: GIVENS CAPSULE STUDY;  Surgeon: Therisa Bi, MD;  Location: [facility] ENDOSCOPY;  Service: Gastroenterology;  Laterality: N/A;   HEMORRHOID SURGERY N/A 10/29/2016   Procedure: HEMORRHOIDECTOMY;  Surgeon: Debby Hila, MD;  Location: [facility];  Service: General;  Laterality: N/A;   MITRAL VALVE ANNULOPLASTY  10/23/1998   Model [id number]; [place] [id number]; size 32mm; [facility]; Dr. Lansing   PACEMAKER IMPLANT N/A 07/28/2019   Procedure: PACEMAKER IMPLANT;  Surgeon: Fernande Elspeth BROCKS, MD;  Location: [facility] INVASIVE CV LAB;  Service: Cardiovascular;  Laterality: N/A;   PILONIDAL CYST EXCISION  1954   RIGHT/LEFT HEART CATH AND CORONARY ANGIOGRAPHY Bilateral 09/21/2022   Procedure: RIGHT/LEFT HEART CATH AND CORONARY ANGIOGRAPHY;  Surgeon: Darron Deatrice LABOR, MD;  Location: [facility] INVASIVE CV LAB;  Service: Cardiovascular;  Laterality: Bilateral;   TEE WITH CARDIOVERSION  05-06-2006 at [facility];  01-02-2013 at [facility]   TEE WITHOUT CARDIOVERSION N/A 06/19/2019   Procedure: TRANSESOPHAGEAL ECHOCARDIOGRAM (TEE);   Surgeon: Mona Vinie BROCKS, MD;  Location: [facility] ENDOSCOPY;  Service: Cardiovascular;  Laterality: N/A;   TOTAL HIP ARTHROPLASTY Left 05/04/2017   Procedure: LEFT TOTAL HIP ARTHROPLASTY ANTERIOR APPROACH;  Surgeon: Vernetta Lonni GRADE, MD;  Location: [facility] OR;  Service: Orthopedics;  Laterality: Left;   TRANSTHORACIC ECHOCARDIOGRAM  05-01-2015   dr fernande   ef 50-55%/  mild AV sclerosis without stenosis/  post MV repair with mild central MR (valve area by pressure half-time 2cm^2,  valve area by continutity equation 0.91cm^2, peak grandiant 75mmHg)/  severe LAE/ mild TR/ mild RAE    TUBAL LIGATION Bilateral 1978    Social History:  reports that she has never smoked. She has never used smokeless tobacco. She reports that she does not currently use alcohol . She reports that she does not use drugs.   Allergies  Allergen Reactions   Amiodarone Swelling    SWELLING REACTION UNSPECIFIED    Penicillins Hives, Rash and Other (See Comments)    Has patient had a PCN reaction causing immediate rash, facial/tongue/throat  swelling, SOB or lightheadedness with hypotension: No  Has patient had a PCN reaction causing severe rash involving mucus membranes or skin necrosis: No  Has patient had a PCN reaction that required hospitalization:Patient was inpatient when reaction occurred  Has patient had a PCN reaction occurring within the last 10 years: No  If all of the above answers are NO, then may proceed with Cephalosporin use   Other Other (See Comments)   Zetia  [Ezetimibe ]    Metoprolol  Other (See Comments)    Hair loss   Statins Rash    REACTION: rash    Family History  Problem Relation Age of Onset   Lung cancer Father        smoker, died at 60   Alcohol  abuse Father    Cancer Father        bladder and lung CA smoker   Sudden death Other    Breast cancer Neg Hx    Stroke Neg Hx       Prior to Admission medications   Medication Sig Start Date End Date Taking? Authorizing Provider   Acetaminophen  (TYLENOL  PO) Take 1 tablet by mouth as needed.    [provider]  albuterol  (VENTOLIN  HFA) 108 (90 Base) MCG/ACT inhaler Inhale 1-2 puffs into the lungs every 6 (six) hours as needed for wheezing or shortness of breath. 09/28/22   Tower, Laine LABOR, MD  bisoprolol  (ZEBETA ) 5 MG tablet Take 1 tablet (5 mg total) by mouth daily. 03/08/23   Sabharwal, Ria, DO  clindamycin  (CLEOCIN ) 150 MG capsule SMARTSIG:4 Capsule(s) By Mouth 04/28/23   [provider]  diphenhydrAMINE -zinc  acetate (BENADRYL ) cream Apply topically.    [provider]  famotidine  (PEPCID ) 20 MG tablet Take 1 tablet (20 mg total) by mouth 2 (two) times daily. 09/02/23   Tower, Laine LABOR, MD  ferrous sulfate  325 (65 FE) MG EC tablet Take 1 tablet (325 mg total) by mouth daily with breakfast. 06/25/23   Tower, Laine LABOR, MD  fluticasone  (FLONASE ) 50 MCG/ACT nasal spray Place 1 spray into both nostrils 2 (two) times daily as needed for allergies or rhinitis. 05/27/22   Tower, Laine LABOR, MD  furosemide  (LASIX ) 40 MG tablet Take 1 tablet (40 mg total) by mouth daily. 10/13/22 03/24/25  Sabharwal, Aditya, DO  JARDIANCE  10 MG TABS tablet TAKE 1 TABLET BY MOUTH DAILY BEFORE BREAKFAST. 05/17/23   Sabharwal, Aditya, DO  levothyroxine  (SYNTHROID ) 75 MCG tablet TAKE 1 TABLET EVERY DAY BEFORE BREAKFAST 12/29/22   Tower, Laine LABOR, MD  losartan  (COZAAR ) 25 MG tablet TAKE 1 TABLET (25 MG TOTAL) BY MOUTH DAILY. 07/06/23   Sabharwal, Aditya, DO  mexiletine (MEXITIL ) 200 MG capsule TAKE 1 CAPSULE BY MOUTH TWICE A DAY 08/31/23   Fernande Elspeth BROCKS, MD  potassium chloride  SA (KLOR-CON  M) 20 MEQ tablet Take 1 tablet (20 mEq total) by mouth daily for 3 days. 12/16/22 03/24/25  Angelena Smalls, MD  spironolactone  (ALDACTONE ) 25 MG tablet Take 0.5 tablets (12.5 mg total) by mouth daily. 04/09/23   Fernande Elspeth BROCKS, MD  VITAMIN D  PO Take by mouth daily.    [provider]  warfarin (COUMADIN ) 5 MG tablet TAKE 1 TABLET DAILY EXCEPT TAKE 1/2  TABLET ON MONDAYS AND THURSDAYS OR AS DIRECTED BY COUMADIN  CLINIC 12/03/22   Tower, Laine LABOR, MD    Physical Exam: BP 133/60   Pulse 70   Temp 98.6 F (37 C) (Oral)   Resp 12   Ht 5' 5 (1.651 m)  Wt 79.4 kg   SpO2 99%   BMI 29.12 kg/m   General: 89 y.o. year-old female well developed well nourished in no acute distress.  Alert and interactive. Cardiovascular: Regular rate and rhythm with no rubs or gallops.  No thyromegaly or JVD noted.  No lower extremity edema. 2/4 pulses in all 4 extremities. Respiratory: Clear to auscultation with no wheezes or rales. Good inspiratory effort. Abdomen: Soft nontender nondistended with normal bowel sounds x4 quadrants. Muskuloskeletal: No cyanosis, clubbing or edema noted bilaterally Neuro: CN II-XII intact, strength, sensation, reflexes Skin: No ulcerative lesions noted or rashes Psychiatry: Mood is appropriate for condition and setting          Labs on Admission:  Basic Metabolic Panel: Recent Labs  Lab 10/05/23 2214  NA 140  K 3.9  CL 106  CO2 25  GLUCOSE 108*  BUN 23  CREATININE 1.04*  CALCIUM  9.2   Liver Function Tests: Recent Labs  Lab 10/05/23 2214  AST 22  ALT 13  ALKPHOS 38  BILITOT 1.0  PROT 7.1  ALBUMIN  3.8   No results for input(s): LIPASE, AMYLASE in the last 168 hours. No results for input(s): AMMONIA in the last 168 hours. CBC: Recent Labs  Lab 10/05/23 2214  WBC 4.5  HGB 13.2  HCT 41.6  MCV 93.1  PLT 155   Cardiac Enzymes: No results for input(s): CKTOTAL, CKMB, CKMBINDEX, TROPONINI in the last 168 hours.  BNP (last 3 results) Recent Labs    10/06/23 0021  BNP 155.1*    ProBNP (last 3 results) No results for input(s): PROBNP in the last 8760 hours.  CBG: Recent Labs  Lab 10/05/23 2212  GLUCAP 109*    Radiological Exams on Admission: DG Chest Portable 1 View Result Date: 10/06/2023 CLINICAL DATA:  Shortness of breath EXAM: PORTABLE CHEST 1 VIEW COMPARISON:   09/02/2023 FINDINGS: Cardiac shadow is enlarged. Pacing device is again seen. Postsurgical changes are noted. Increased central vascular congestion is noted with interstitial edema. No focal infiltrate is noted. No bony abnormality is seen. IMPRESSION: Changes of mild CHF. Electronically Signed   By: Oneil Devonshire M.D.   On: 10/06/2023 00:15    EKG: I independently viewed the EKG done and my findings are as followed: Ventricular paced.  QTc 501.  Assessment/Plan Present on Admission:  AMS (altered mental status)  Principal Problem:   AMS (altered mental status)  Acute metabolic encephalopathy, suspect multifactorial in the setting of presumptive UTI, mild CHF exacerbation. Reorient as needed Continue to treat underlying conditions Fall precautions  Presumptive UTI, POA Follow urine culture Continue antibiotics  Acute on chronic heart failure with preserved EF Received IV Lasix  40 mg x 1 in Monitor urine output Strict I's and O's and daily weight Repeat 2D echo  Permanent A-fib Resume home Coumadin  Obtain INR   Time: 75 minutes.   DVT prophylaxis: Home Coumadin .  Code Status: Full code.  Family Communication: None.  Disposition Plan: Admitted to telemetry cardiac unit.  Consults called: None.  Admission status: Inpatient status.   Status is: Inpatient The patient requires at least 2 midnights for further evaluation and treatment of present condition.   Terry LOISE Hurst MD [facility] Pager [phone]  If 7PM-7AM, please contact night-coverage [email or web address] Password [facility]  10/06/2023, 6:37 AM

## 2023-10-06 NOTE — Plan of Care (Signed)
 Video call made with patient using [employer] tablet, with paramedic Robin in the home with the patient and daughter-in-law. Call placed to patient's personal cell phone to ensure connection. Informed patient that virtual nurses are available 24/7 for questions. Phone number to virtual hub shown to family and patient. Reinforced that we are here to answer any questions at any time that she or her family may have.   Reviewed with Robin skin assessment. All skin WNL. No breakdown or sores found. Evening medications reviewed with Robin. Pt will be residing with her son, so there was no need for medication reconciliation with home meds that were not listed on MAR.  Discussed with patient toileting and hygiene related to toileting and frequent incontinence pad changing, drinking fluids, and eating well to ensure healing. Discussed disease process of UTIs and how they can affect mental status as well as physical symptoms.   Video call completed, but paramedic Grayce showed patient how to call through the tablet if patient needed assistance. This was done via teach back. Discussed with patient that she should expect a call from oncoming evening nurse to discuss timing of night medication pass.

## 2023-10-06 NOTE — Progress Notes (Addendum)
 Courtesy visit- No billing:  Patient is seen and examined today morning. She is admitted for evaluation of confusion, possible UTI. Today morning she is more alert, awake and able to answer me though mildly confused and anxious. Did complain of having tingling numbness of face on and off. No headache, blurred vision.  Patient has medical history significant for paroxysmal A-fib on Coumadin , hypothyroidism, hyperlipidemia, depression, anxiety, prior CVA, heart failure with reduced ejection fraction.  Plan to continue IV Rocephin  therapy, follow urine cultures.  CT head without contrast ordered-show no evidence of acute infarct.  Continue Coumadin  therapy as per pharmacy protocol.  Resume patient's home antihypertensives, home dose of Lasix .  She agrees with hospital at home program.  Dr. Eldonna evaluated her for hospital at home, discussed with neurologist who think symptoms related to UTI.  She is hemodynamically stable to be transferred to Hospital at home program.

## 2023-10-06 NOTE — ED Notes (Signed)
 CCMD called for cardiac monitoring.

## 2023-10-06 NOTE — Plan of Care (Addendum)
 Progress Note    [name]   FMW:[id number]  DOB: 08-Aug-1932  DOA: 10/05/2023     0 Date of Service: 10/06/2023     Subjective:  [name] is a 89 y.o. female with medical history significant for persistent A-fib on Coumadin , remote hx/o CVA,  ,heart block s/p PPM, hypothyroidism, who presents to the ER with encephalopathy, UTI, mild CHF.  Denies having any abdominal pain or diarrhea.  No reported fevers or chills.   In the ER, UA positive for pyuria.  Chest x-ray with changes of mild CHF.  BNP mildly elevated in the setting of obesity.  Started on aztreonam  in the ER due to severe penicillin allergy.  Also received a dose of IV Lasix  40 mg x 1.  Admitted by [facility], hospitalist service.   ED Course: Temperature 98.6.  BP 122/60, pulse 71, respiratory rate 12, O2 saturation 100% on room air. WBC 4.5, hgb 13.2, plt 155, Cr 1.04, UA indicative of infection CXR consistent with mild CHF. CT head WNL.   Subjective:  Today, pt states that confusion has improved though not completely resolved. Denies any focal hemiparesis or confusion. Does report some intermittent alternating episodes of facial paresthesias affecting either side of the face. Pt unclear of duration of symptoms. No slurred speech. No gait instability.  Patient states that she [place] and is high functioning at home.  Performing all of her ADLs independently.  Has send and lives in [place] and is otherwise independent.  Minimal orthopnea or PND at present.  No abdominal pain or diarrhea.  Extensive discussion at the bedside as well as patient's son [name] ([phone]) about the hospital at home program including overall risks and benefits. Pt is agreeable to program. Plan for patient to be cared at patient's son house for treatment.   Hospital Problems Assessment and Plan: Acute metabolic encephalopathy, suspect multifactorial in the setting of presumptive UTI, mild CHF exacerbation. Noted metabolic  encephalopathy in presentation in setting of UTI and CHF Improving on evaluation today does still with some mild confusion Grossly nonfocal neuroexam CT head within normal limits Will continue treatment with IV Rocephin  Cleared by PT OT Otherwise monitor closely  Presumptive UTI, POA Urinalysis indicative of infection in the setting of acute encephalopathy on presentation Urine culture pending Will continue IV Rocephin  for additional 24 hours in the setting of encephalopathy Anticipate bridging to orals Monitor   Acute on chronic heart failure with preserved EF 2D ECHO 02/2023 w/ EF 45-50%,  Received IV Lasix  40 mg x 1 in the ER  SOB has improved  Monitor urine output Strict I's and O's and daily weight Repeat 2D echo   Permanent A-fib Rate controlled at present  INR 1.7  Continue coumadin  dosing per pharmacy  Follow closely  History of CVA Noted prior history of CVA  Noted transient facial paresthesias today of unknown duration  Noted 10/2018 MRI brain for similar sxs  CT head today WNL  Grossly nonfocal neuro exam  Noted baseline PPM- relative contraindication to MRI Brain  Case discussed w/ [name] w/ neurology  Suspect sxs likely secondary to UTI in setting of encephalopathy  Will monitor for now  Cont coumadin  in setting of Afib  Consider repeat imaging as appropriate          Objective Vital signs were reviewed and unremarkable. Vitals:   10/06/23 1030 10/06/23 1100 10/06/23 1130 10/06/23 1451  BP: (!) 144/63 130/61 124/63 119/61  Pulse: 76 70 69 69  Temp: 97.9 F (36.6 C)   97.8 F (36.6 C)  Resp: 17 (!) 30 (!) 21 14  Height:      Weight:      SpO2: 99% 97% 96% 98%  TempSrc: Oral   Oral  BMI (Calculated):        Exam Physical Exam Constitutional:      Appearance: She is normal weight.  HENT:     Head: Normocephalic.     Nose: Nose normal.     Mouth/Throat:     Mouth: Mucous membranes are moist.  Eyes:     Pupils: Pupils are  equal, round, and reactive to light.  Cardiovascular:     Rate and Rhythm: Normal rate and regular rhythm.  Pulmonary:     Effort: Pulmonary effort is normal.  Abdominal:     General: Bowel sounds are normal.  Musculoskeletal:        General: Normal range of motion.  Skin:    General: Skin is warm.  Neurological:     General: No focal deficit present.  Psychiatric:        Mood and Affect: Mood normal.      Labs / Other Information There are no new results to review at this time. ECHOCARDIOGRAM COMPLETE    ECHOCARDIOGRAM REPORT       Patient Name:   [name] Date of Exam: 10/06/2023 Medical Rec #:  [id number]     Height:       65.0 in Accession #:    [id number]    Weight:       175.0 lb Date of Birth:  1933-01-10      BSA:          1.869 m Patient Age:    89 years      BP:           124/63 mmHg Patient Gender: F             HR:           76 bpm. Exam Location:  Inpatient  Procedure: 2D Echo, Cardiac Doppler and Color Doppler (Both Spectral and Color            Flow Doppler were utilized during procedure).  Indications:    CHF   History:        Patient has prior history of Echocardiogram examinations.   Sonographer:    [name] Referring Phys: [name]  IMPRESSIONS   1. Left ventricular ejection fraction, by estimation, is 55 to 60%. The left ventricle has normal function. The left ventricle demonstrates global hypokinesis. There is moderate concentric left ventricular hypertrophy. Left ventricular diastolic  function could not be evaluated.  2. Right ventricular systolic function is normal. The right ventricular size is normal.  3. Left atrial size was severely dilated.  4. Device lead seen. Right atrial size was severely dilated.  5. The mitral valve is normal in structure. Mild to moderate mitral valve regurgitation. Mild to moderate mitral stenosis. Moderate to severe mitral annular calcification.  6. Tricuspid valve regurgitation is moderate.  7. The  aortic valve is calcified. There is moderate calcification of the aortic valve. There is moderate thickening of the aortic valve. Aortic valve regurgitation is trivial. Aortic valve sclerosis/calcification is present, without any evidence of  aortic stenosis.  8. The inferior vena cava is normal in size with greater than 50% respiratory variability, suggesting right atrial pressure of 3 mmHg.  FINDINGS  Left Ventricle: Left ventricular ejection fraction, by estimation, is 55 to 60%. The left ventricle has normal function. The left ventricle demonstrates global hypokinesis. The left ventricular internal cavity size was normal in size. There is moderate  concentric left ventricular hypertrophy. Left ventricular diastolic function could not be evaluated due to mitral annular calcification (moderate or greater). Left ventricular diastolic function could not be evaluated.  Right Ventricle: The right ventricular size is normal. No increase in right ventricular wall thickness. Right ventricular systolic function is normal.  Left Atrium: Left atrial size was severely dilated.  Right Atrium: Device lead seen. Right atrial size was severely dilated.  Pericardium: There is no evidence of pericardial effusion. Presence of epicardial fat layer.  Mitral Valve: The mitral valve is normal in structure. Moderate to severe mitral annular calcification. Mild to moderate mitral valve regurgitation. Mild to moderate mitral valve stenosis. MV peak gradient, 12.2 mmHg. The mean mitral valve gradient is  6.0 mmHg.  Tricuspid Valve: The tricuspid valve is normal in structure. Tricuspid valve regurgitation is moderate . No evidence of tricuspid stenosis.  Aortic Valve: The aortic valve is calcified. There is moderate calcification of the aortic valve. There is moderate thickening of the aortic valve. There is moderate aortic valve annular calcification. Aortic valve regurgitation is trivial. Aortic valve   sclerosis/calcification is present, without any evidence of aortic stenosis.  Pulmonic Valve: The pulmonic valve was normal in structure. Pulmonic valve regurgitation is not visualized. No evidence of pulmonic stenosis.  Aorta: The aortic root is normal in size and structure.  Venous: The inferior vena cava is normal in size with greater than 50% respiratory variability, suggesting right atrial pressure of 3 mmHg.  IAS/Shunts: No atrial level shunt detected by color flow Doppler.  Additional Comments: A device lead is visualized.    LEFT VENTRICLE PLAX 2D LVIDd:         4.10 cm     Diastology LVIDs:         3.50 cm     LV e' medial:  7.62 cm/s LV PW:         1.20 cm     LV e' lateral: 14.00 cm/s LV IVS:        1.20 cm LVOT diam:     1.70 cm LV SV:         39 LV SV Index:   21 LVOT Area:     2.27 cm   LV Volumes (MOD) LV vol d, MOD A2C: 51.7 ml LV vol d, MOD A4C: 67.4 ml LV vol s, MOD A2C: 18.3 ml LV vol s, MOD A4C: 30.2 ml LV SV MOD A2C:     33.4 ml LV SV MOD A4C:     67.4 ml LV SV MOD BP:      36.7 ml  RIGHT VENTRICLE RV Basal diam:  3.60 cm RV S prime:     11.80 cm/s TAPSE (M-mode): 1.0 cm  LEFT ATRIUM              Index         RIGHT ATRIUM           Index LA diam:        5.10 cm  2.73 cm/m    RA Area:     32.90 cm LA Vol (A2C):   195.0 ml 104.33 ml/m  RA Volume:   122.00 ml 65.28 ml/m LA Vol (A4C):   105.0 ml 56.18 ml/m LA Biplane Vol: 156.0  ml 83.47 ml/m  AORTIC VALVE LVOT Vmax:   82.00 cm/s LVOT Vmean:  59.200 cm/s LVOT VTI:    0.172 m   AORTA Ao Root diam: 3.20 cm Ao Asc diam:  3.40 cm  MITRAL VALVE MV Area VTI:  0.98 cm    SHUNTS MV Peak grad: 12.2 mmHg   Systemic VTI:  0.17 m MV Mean grad: 6.0 mmHg    Systemic Diam: 1.70 cm MV Vmax:      1.75 m/s MV Vmean:     120.0 cm/s  Kardie Tobb DO Electronically signed by Dub Huntsman DO Signature Date/Time: 10/06/2023/2:15:23 PM      Final   CT HEAD WO CONTRAST ( ) CLINICAL DATA:  89 year old  female with unexplained altered mental status.  EXAM: CT HEAD WITHOUT CONTRAST  TECHNIQUE: Contiguous axial images were obtained from the base of the skull through the vertex without intravenous contrast.  RADIATION DOSE REDUCTION: This exam was performed according to the departmental dose-optimization program which includes automated exposure control, adjustment of the mA and/or kV according to patient size and/or use of iterative reconstruction technique.  COMPARISON:  Brain MRI 06/16/2019.  Head CT 03/02/2021.  FINDINGS: Brain: Chronic posterior Right MCA territory infarct with encephalomalacia is stable on series 3, image 25. And a smaller area of chronic left occipital pole cortical encephalomalacia is stable on image 19.  Background brain volume is normal for age. Elsewhere gray-white differentiation is normal for age.  No midline shift, ventriculomegaly, mass effect, evidence of mass lesion, intracranial hemorrhage or evidence of cortically based acute infarction.  Vascular: Calcified atherosclerosis at the skull base. No suspicious intracranial vascular hyperdensity.  Skull: Intact. No acute osseous abnormality identified. Chronic left TMJ degeneration.  Sinuses/Orbits: Right maxillary sinus mucoperiosteal thickening has developed since 2021. Other Visualized paranasal sinuses and mastoids are stable and well aerated.  Other: No acute orbit or scalp soft tissue finding.  IMPRESSION: 1. No acute intracranial abnormality. 2. 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 3. Acute on chronic right maxillary sinus disease has developed since 2021.  Electronically Signed   By: VEAR Hurst M.D.   On: 10/06/2023 10:34 DG Chest Portable 1 View CLINICAL DATA:  Shortness of breath  EXAM: PORTABLE CHEST 1 VIEW  COMPARISON:  09/02/2023  FINDINGS: Cardiac shadow is enlarged. Pacing device is again seen. Postsurgical changes are noted. Increased central  vascular congestion is noted with interstitial edema. No focal infiltrate is noted. No bony abnormality is seen.  IMPRESSION: Changes of mild CHF.  Electronically Signed   By: Oneil Devonshire M.D.   On: 10/06/2023 00:15  Lab Results  Component Value Date   WBC 4.0 10/06/2023   HGB 12.7 10/06/2023   HCT 38.9 10/06/2023   MCV 90.7 10/06/2023   PLT 146 (L) 10/06/2023   Last metabolic panel Lab Results  Component Value Date   GLUCOSE 91 10/06/2023   NA 142 10/06/2023   K 4.2 10/06/2023   CL 101 10/06/2023   CO2 28 10/06/2023   BUN 20 10/06/2023   CREATININE 0.94 10/06/2023   GFRNONAA 58 (L) 10/06/2023   CALCIUM  9.0 10/06/2023   PHOS 3.3 10/06/2023   PROT 7.1 10/05/2023   ALBUMIN  3.8 10/05/2023   BILITOT 1.0 10/05/2023   ALKPHOS 38 10/05/2023   AST 22 10/05/2023   ALT 13 10/05/2023   ANIONGAP 13 10/06/2023    Hospital at Home Admission Criteria Checklist:  Formal consent explained in detail and signed at the bedside:Yes  Patient meets inpatient  admission criteria (see below for further details)  Is pt medicare FFS( required for initial launch with plan to expand)? Yes  Lives within 25 mil/ 30 min from [facility] within [place](pt may stay with family member during admission who lives within 25 miles or 30 min from [facility] w/in [facility]) ? Yes   Hemodynamically stable with relatively low risk of clinical deterioration-not requiring ICU? Yes  Age >55?yes  Does not require frequent touch-points or complex interventions/medications (ie Titrated Infusions (IV insulin, heparin  drips, vasoactive drips, use of infused or injectable controlled substances, patients on insulin)?  Any Behavioral Health comorbidities likely to increase risk for in-home care (ie Acute delirium or experiencing a marked altered mental status and cause is not a treatable condition in the home)? No   Has the patient been on BIPAP during course of ED evaluation or hospitalization?no  IF YES, Has the  patient been off of BIPAP for >24 hours(If NO-THEN PATIENT DOES MEET INCLUSION CRITERIA)?Key/a  Needs oxygen at home (<6L)? No  Active safety concerns (ie Unable to use bedside commode independently and lacks caregiver support for safety- needs SNF placement, unable to obtain IV access)? No  Has skin check been performed?pending paramedic assessment  Has Physical Therapy screened the patient?yes Common admission diagnoses including: CAP, COPD Exacerbation, Acute on chronic heart failure, Cellulitis, UTI , dehydration, acute resp failure with hypoxia (requiring <5L)   Social Screening: Denies significant ETOH intake? No ETPH  Does not smoke and understands may not smoke in the presence of oxygen? No smoking   Patient states able to use iPad/phone for communication/has family who is able to use? yes  Patient has agreed to be compliant with medication and treatment regimen of the program?yes Any active drug use in patient or primary caregiver including daily dosing of methadone?  No  Stable home environment ( access to appropriate heating in cold conditions and/or appropriate air conditioning in hot conditions and/or no running water/electricity)? Yes   No aggressive pets at home? No  Firearm present  with ability or willingness to store them unloaded in a locked case for duration of hospitalization? no  Ambulatory? yes (independently cane, walker  wheelchair bound, bed bound) Bed bugs present on home evaluation? Pending evaluation   Family support system in place? yes  Patient feels safe at home does not endorse any violence? yes Any actively decompensated behavioral healthy issues including agitation/aggressive behavior? No    Patient requests food to be provided by hospital home program?Key/a PT/OT eval completed and not requiring SNF, ALF, inpatient rehab? Yes    To be admitted to the [facility] program, a patient generally must meet the following: 1. Requirement for Inpatient Level of  Care: The patient's condition must necessitate an inpatient level of care. This is typically indicated by one or more of the following, depending on their specific diagnosis: -Persistent tachycardia despite appropriate treatment (e.g., for Heart Failure, UTI). - Persistent tachypnea (rapid breathing) or dyspnea (shortness of breath) that hasn't improved sufficiently with observation care (e.g., for Heart Failure, Pneumonia, Viral Illness, COVID). - Hypoxemia (low oxygen levels), such as a new need for oxygen, an increased need from baseline, or specific oxygen saturation levels (e.g., SpO2 <90-94% depending on the condition) that persist despite observation (e.g., for Heart Failure, COPD, Pneumonia, Viral Illness, COVID). - Need for Intravenous (IV) hydration due to an inability to maintain oral hydration, which persists despite observation care (e.g., for Cellulitis, UTI, Viral Illness, COVID). - Specific to Heart Failure:  Persistent pulmonary edema, indicated by a new oxygen need, lack of improvement with IV diuretics, and ongoing tachypnea/dyspnea. - Specific to COPD: A decrease in known baseline resting oxygen saturation (SpO2) by 4% or more, or an increase in pre-existing supplemental oxygen requirements, which persists despite observation and requires continued close monitoring. - Specific to Pneumonia: A Pneumonia Severity Index (PSI) class IV (moderate risk). - Specific to Cellulitis: Failure of outpatient antibiotic therapy (indicated by progression or no improvement after a minimum of 48 hours on an adequate regimen) or a clinical presentation (like acuity or rapidity of progression) that requires the intensity of monitoring found in an inpatient setting. - Specific to UTI: Persistence or worsening of clinical findings like fever, pain, or dehydration despite observation care; presence of significant uropathy; suspected infection of an indwelling prosthetic device, stent,  implant, or graft; or pregnancy with suspected pyelonephritis. 2. Appropriateness for Hospital at Home Setting: The patient's overall clinical picture, including the severity of their illness, their care needs, and their medical history and comorbidities, must be suitable for management in the Hospital at Home environment. This essentially means that none of the exclusion criteria (listed below) are met.  Unified Exclusion Criteria for Hospital at Home Admission: A patient would not be eligible for Hospital at Home if any of the following are present: - Hemodynamic Instability: - Hypotension (low blood pressure) is present. - Respiratory Instability or Needs Beyond Program Capability: - There is a new need for invasive or noninvasive ventilatory assistance (like BiPAP or a ventilator). - Oxygenation is not sufficient, generally indicated if an FiO2 (fraction of inspired oxygen) of 45% (which is about 6 Liters/minute via nasal cannula) or more is required to keep oxygen saturation (SpO2) at 90% or greater. - Monitoring or Procedural Needs Beyond Program Capability: - There is a need for invasive monitoring, such as a pulmonary artery catheter or an arterial line. - There is a need for immediate-response telemetry monitoring (for dangerous arrhythmia detection and subsequent immediate intervention). - The required medication regimen is beyond the capabilities of Hospital at Home (e.g., dosing intervals are too frequent for home administration). - There is a need for a procedure that cannot be performed by the [facility] team (e.g., significant wound debridement or abscess drainage for cellulitis, or percutaneous nephrostomy for a complicated UTI). - Significant Organ Dysfunction or Markers of Severe Illness: - Mental status is not at baseline, or there is altered mental status suggestive of inadequate perfusion. - Renal (kidney) function is unstable or showing an ongoing decline. -  There is evidence of inadequate perfusion, such as metabolic acidosis or myocardial ischemia. - Uncompensated acidosis is present. - Condition-Specific Severity or Complications Making Home Care Unsuitable: -For Heart Failure: Known severe cardiac valvular disease (e.g., aortic stenosis, mitral regurgitation); or severe peripheral edema that impairs the ability to urinate or ambulate. -For COPD: Known concurrent comorbidity or finding that indicates a higher-risk COPD exacerbation (e.g., pulmonary fibrosis, cavitation, pleural effusion, pneumothorax, rib fracture). -For Pneumonia: Pneumonia Severity Index (PSI) class V (indicating high risk for inpatient mortality); known concurrent comorbidity or finding that indicates higher-risk pneumonia (e.g., pulmonary fibrosis, cavitation, large or loculated pleural effusion); or a concomitant serious infectious process like endocarditis or empyema. -For Cellulitis: Orbital, periorbital, or necrotizing infection is suspected; or a concomitant serious infectious process like endocarditis, septic emboli, or septic joint space infection. -For UTI: Urinary tract obstruction (e.g., kidney stone, bladder outlet obstruction); or a concomitant serious infectious process like endocarditis or septic emboli. -For  Viral Illness & COVID-19: A concomitant serious infectious process like endocarditis or empyema. General Comorbidities or Status: -The patient is significantly immunosuppressed (this applies to Pneumonia, Cellulitis, UTI, Viral Illness, and COVID-19). -The patient meets inpatient admission criteria for a second diagnosis, or has care needs beyond the capabilities of Hospital at Home due to an active clinically significant comorbidity. (This is a general exclusion across all listed conditions)   Time spent: >60 minutes  [facility] 10/06/2023, 2:40 PM

## 2023-10-06 NOTE — Progress Notes (Signed)
 PHARMACY - PHYSICIAN COMMUNICATION CRITICAL VALUE ALERT - BLOOD CULTURE IDENTIFICATION (BCID)  [name] is an 89 y.o. female who presented to [facility] on 10/05/2023 with a chief complaint of confusion.  Assessment:  Admitted to Hospital at Home for treatment of acute metabolic encephalopathy d/t presumed UTI, now growing Staph epi in one blood cx bottle, likely contaminant.  Name of physician Contacted: Corbett NP  Current antibiotics: ceftriaxone   Changes to prescribed antibiotics recommended:  Patient is on recommended antibiotics - No changes needed  Results for orders placed or performed during the hospital encounter of 10/05/23  Blood Culture ID Panel (Reflexed) (Collected: 10/06/2023 12:14 AM)  Result Value Ref Range   Enterococcus faecalis NOT DETECTED NOT DETECTED   Enterococcus Faecium NOT DETECTED NOT DETECTED   Listeria monocytogenes NOT DETECTED NOT DETECTED   Staphylococcus species DETECTED (A) NOT DETECTED   Staphylococcus aureus (BCID) NOT DETECTED NOT DETECTED   Staphylococcus epidermidis DETECTED (A) NOT DETECTED   Staphylococcus lugdunensis NOT DETECTED NOT DETECTED   Streptococcus species NOT DETECTED NOT DETECTED   Streptococcus agalactiae NOT DETECTED NOT DETECTED   Streptococcus pneumoniae NOT DETECTED NOT DETECTED   Streptococcus pyogenes NOT DETECTED NOT DETECTED   A.calcoaceticus-baumannii NOT DETECTED NOT DETECTED   Bacteroides fragilis NOT DETECTED NOT DETECTED   Enterobacterales NOT DETECTED NOT DETECTED   Enterobacter cloacae complex NOT DETECTED NOT DETECTED   Escherichia coli NOT DETECTED NOT DETECTED   Klebsiella aerogenes NOT DETECTED NOT DETECTED   Klebsiella oxytoca NOT DETECTED NOT DETECTED   Klebsiella pneumoniae NOT DETECTED NOT DETECTED   Proteus species NOT DETECTED NOT DETECTED   Salmonella species NOT DETECTED NOT DETECTED   Serratia marcescens NOT DETECTED NOT DETECTED   Haemophilus influenzae NOT DETECTED NOT DETECTED    Neisseria meningitidis NOT DETECTED NOT DETECTED   Pseudomonas aeruginosa NOT DETECTED NOT DETECTED   Stenotrophomonas maltophilia NOT DETECTED NOT DETECTED   Candida albicans NOT DETECTED NOT DETECTED   Candida auris NOT DETECTED NOT DETECTED   Candida glabrata NOT DETECTED NOT DETECTED   Candida krusei NOT DETECTED NOT DETECTED   Candida parapsilosis NOT DETECTED NOT DETECTED   Candida tropicalis NOT DETECTED NOT DETECTED   Cryptococcus neoformans/gattii NOT DETECTED NOT DETECTED   Methicillin resistance mecA/C NOT DETECTED NOT DETECTED    Marvetta Dauphin, PharmD, BCPS  10/06/2023  11:43 PM

## 2023-10-06 NOTE — Evaluation (Signed)
 Physical Therapy Evaluation Patient Details Name: [name] MRN: [id number] DOB: [birth year] Today's Date: 10/06/2023  History of Present Illness  Pt is an 89yo female who presented to ED with AMS. Suspect UTI. CT of head negative for acute infarct. PMH:  a-fib, hypthyroidism, HLD, depression, anxiety, prior CVA, HF with dec ejection fraction.  Clinical Impression  Pt presented with AMS however appears to be at baseline level of cognition. Pt A&Ox4 and aware of medical condition. Pt very tangential but cooperative with PT and appreciative of services. Pt received on BSC in which she performed pericare with set up. Pt benefited from R HHA during ambulation and agreed to use [facility] upon returning home. Pt reports having good support via her sons despite living alone. Acute PT to cont to follow while in hospital but I do not anticipate she will need follow up PT upon d/c.          If plan is discharge home, recommend the following: Assist for transportation   Can travel by private vehicle        Equipment Recommendations None recommended by PT (has [facility])  Recommendations for Other Services       Functional Status Assessment Patient has had a recent decline in their functional status and demonstrates the ability to make significant improvements in function in a reasonable and predictable amount of time.     Precautions / Restrictions Precautions Precautions: Fall Restrictions Weight Bearing Restrictions Per Provider Order: No      Mobility  Bed Mobility Overal bed mobility: Modified Independent             General bed mobility comments: no difficulty getting in/out of bed, HOB slightly elevated    Transfers Overall transfer level: Needs assistance Equipment used: None Transfers: Sit to/from Stand, Bed to chair/wheelchair/BSC Sit to Stand: Supervision   Step pivot transfers: Supervision       General transfer comment: pt with pause upon standing with report I just need  to get my bearings, I've been laying in bed for 24 hours. pushed up from arm rests on BSC and then from EOB    Ambulation/Gait Ambulation/Gait assistance: Contact guard assist Gait Distance (Feet): 150 Feet Assistive device: 1 person hand held assist Gait Pattern/deviations: Step-through pattern, Decreased stride length Gait velocity: dec Gait velocity interpretation: 1.31 - 2.62 ft/sec, indicative of limited community ambulator   General Gait Details: pt preferred to hold onto PTs hand and stated she had a cane in her car because some days are better than others  Stairs            Wheelchair Mobility     Tilt Bed    Modified Rankin (Stroke Patients Only)       Balance Overall balance assessment: Mild deficits observed, not formally tested                                           Pertinent Vitals/Pain Pain Assessment Pain Assessment: No/denies pain    Home Living Family/patient expects to be discharged to:: Private residence Living Arrangements: Alone Available Help at Discharge: Family;Available PRN/intermittently Type of Home: House Home Access: Level entry       Home Layout: One level Home Equipment: Cane - single point      Prior Function Prior Level of Function : Independent/Modified Independent;Driving  Mobility Comments: indep, uses cane occasionally outsdie ADLs Comments: indep     Extremity/Trunk Assessment   Upper Extremity Assessment Upper Extremity Assessment: Overall WFL for tasks assessed    Lower Extremity Assessment Lower Extremity Assessment: Generalized weakness    Cervical / Trunk Assessment Cervical / Trunk Assessment: Normal  Communication   Communication Communication: No apparent difficulties    Cognition Arousal: Alert Behavior During Therapy: WFL for tasks assessed/performed   PT - Cognitive impairments: No apparent impairments                         Following  commands: Intact       Cueing Cueing Techniques: Verbal cues     General Comments General comments (skin integrity, edema, etc.): VSS, pt performed pericare in sitting and then changed maxipad in her underwear in standing. Pt also able to pull up underwear and pants and tie them all in standing without assist. pt noted to be in wide base of support but steady    Exercises     Assessment/Plan    PT Assessment Patient needs continued PT services  PT Problem List Decreased activity tolerance;Decreased balance;Decreased mobility       PT Treatment Interventions DME instruction;Gait training;Functional mobility training;Therapeutic activities;Therapeutic exercise;Balance training    PT Goals (Current goals can be found in the Care Plan section)  Acute Rehab PT Goals Patient Stated Goal: home PT Goal Formulation: With patient Time For Goal Achievement: 10/20/23 Potential to Achieve Goals: Good Additional Goals Additional Goal #1: Pt to score > 19 on DGI to indicate minimal falls risk.    Frequency Min 2X/week     Co-evaluation               AM-PAC PT 6 Clicks Mobility  Outcome Measure Help needed turning from your back to your side while in a flat bed without using bedrails?: None Help needed moving from lying on your back to sitting on the side of a flat bed without using bedrails?: None Help needed moving to and from a bed to a chair (including a wheelchair)?: None Help needed standing up from a chair using your arms (e.g., wheelchair or bedside chair)?: A Little Help needed to walk in hospital room?: A Little Help needed climbing 3-5 steps with a railing? : A Little 6 Click Score: 21    End of Session Equipment Utilized During Treatment: Gait belt Activity Tolerance: Patient tolerated treatment well Patient left: in bed;with call bell/phone within reach (sitting on EOB to eat lunch) Nurse Communication: Mobility status PT Visit Diagnosis: Unsteadiness on feet  (R26.81);Difficulty in walking, not elsewhere classified (R26.2)    Time: 8768-8742 PT Time Calculation (min) (ACUTE ONLY): 26 min   Charges:   PT Evaluation $PT Eval Low Complexity: 1 Low PT Treatments $Gait Training: 8-22 mins PT General Charges $$ ACUTE PT VISIT: 1 Visit         Norene Ames, PT, DPT Acute Rehabilitation Services Secure chat preferred Office #: [phone]   Norene CHRISTELLA Ames 10/06/2023, 1:51 PM

## 2023-10-06 NOTE — Progress Notes (Signed)
 1930--Call contact attempt to patient- no answer/mailbox full. Call to patient for introduction and PM medication plan review.  1932-Call contact attempt to Son-no answer, no message left.  1935-2nd call attempt to patient. Patient reached, daughter in law also available. RN introduction, partial assessment, and self-administration medication plan, and [facility] contact information reviewed. Patient agreed to a video call closer to time medication is due. Patient identifiers completed, patient alert and oriented routine, patient confirmed no falls in the last 6 months denied pain, respiratory, cardiac concerns at time of call. Patient encouraged to call RN if needed before upcoming video call, patient will continue to be monitored.   2158-----Contact via video completed. Son also present during call. Remainder of assessment completed. Patient still confirms no pain, or health concerns, but looking forward to a good night of sleep. Patient and son informed provider was notified prior to contact of last dose of Mexitil  200mg  (BID) was given at 1727 per [facility], provider confirmed to hold 2200 dose for tonight, patient and son verbalized understanding of change. Patient denied need for PRN dose of Melatonin tablet 5 mg for tonight. No medications were witness for self-administration. Patient nor son had additional questions or concerns after review of plan for upcoming morning call for next shift RN. 0600 medication (Synthroid  75 mcg) time was adjusted with pharmacy per patient request. Plan for ongoing current health monitoring viewed.  Plan for when to call RN verses 911 for emergent needs related to significant of changes in condition completed. Education on Synthroid  and plan for daily weight, and charging current health wearable reviewed. Patient will continue to be monitored throughout the night.

## 2023-10-07 DIAGNOSIS — E039 Hypothyroidism, unspecified: Secondary | ICD-10-CM

## 2023-10-07 DIAGNOSIS — N3000 Acute cystitis without hematuria: Secondary | ICD-10-CM

## 2023-10-07 DIAGNOSIS — G9341 Metabolic encephalopathy: Secondary | ICD-10-CM

## 2023-10-07 DIAGNOSIS — I482 Chronic atrial fibrillation, unspecified: Secondary | ICD-10-CM

## 2023-10-07 DIAGNOSIS — I5033 Acute on chronic diastolic (congestive) heart failure: Principal | ICD-10-CM

## 2023-10-07 LAB — COMPREHENSIVE METABOLIC PANEL WITH GFR
ALT: 13 U/L (ref 0–44)
AST: 19 U/L (ref 15–41)
Albumin: 3.5 g/dL (ref 3.5–5.0)
Alkaline Phosphatase: 36 U/L — ABNORMAL LOW (ref 38–126)
Anion gap: 11 (ref 5–15)
BUN: 20 mg/dL (ref 8–23)
CO2: 27 mmol/L (ref 22–32)
Calcium: 9.3 mg/dL (ref 8.9–10.3)
Chloride: 104 mmol/L (ref 98–111)
Creatinine, Ser: 1.03 mg/dL — ABNORMAL HIGH (ref 0.44–1.00)
GFR, Estimated: 52 mL/min — ABNORMAL LOW (ref >60)
Glucose, Bld: 94 mg/dL (ref 70–99)
Potassium: 4 mmol/L (ref 3.5–5.1)
Sodium: 142 mmol/L (ref 135–145)
Total Bilirubin: 0.7 mg/dL (ref 0.0–1.2)
Total Protein: 7.1 g/dL (ref 6.5–8.1)

## 2023-10-07 LAB — PROTIME-INR
INR: 1.4 — ABNORMAL HIGH (ref 0.8–1.2)
Prothrombin Time: 18.3 s — ABNORMAL HIGH (ref 11.4–15.2)

## 2023-10-07 LAB — CBC
HCT: 41.1 % (ref 36.0–46.0)
Hemoglobin: 13.1 g/dL (ref 12.0–15.0)
MCH: 29.1 pg (ref 26.0–34.0)
MCHC: 31.9 g/dL (ref 30.0–36.0)
MCV: 91.3 fL (ref 80.0–100.0)
Platelets: 148 K/uL — ABNORMAL LOW (ref 150–400)
RBC: 4.5 MIL/uL (ref 3.87–5.11)
RDW: 14.9 % (ref 11.5–15.5)
WBC: 4.3 K/uL (ref 4.0–10.5)
nRBC: 0 % (ref 0.0–0.2)

## 2023-10-07 LAB — URINE CULTURE

## 2023-10-07 LAB — BRAIN NATRIURETIC PEPTIDE: B Natriuretic Peptide: 114 pg/mL — ABNORMAL HIGH (ref 0.0–100.0)

## 2023-10-07 MED ORDER — SALINE SPRAY 0.65 % NA SOLN
2.0000 | NASAL | Status: DC | PRN
  Administered 2023-10-07: 2 via NASAL
  Filled 2023-10-07: qty 44

## 2023-10-07 MED ORDER — WARFARIN SODIUM 6 MG PO TABS
6.0000 mg | ORAL_TABLET | Freq: Once | ORAL | Status: AC
  Administered 2023-10-07: 6 mg via ORAL
  Filled 2023-10-07: qty 1

## 2023-10-07 MED ORDER — ENSURE PLUS HIGH PROTEIN PO LIQD
237.0000 mL | Freq: Two times a day (BID) | ORAL | Status: DC
  Administered 2023-10-07 – 2023-10-08 (×2): 237 mL via ORAL
  Filled 2023-10-07 (×5): qty 237

## 2023-10-07 NOTE — Assessment & Plan Note (Addendum)
 10-07-2023 check INR today. Pharmacy following for coumadin  dosing.  10-08-2023 INR drawn today. Will resume her home dosing of coumadin  at discharge. 2.5 mg every Mon, Wed, Fri; 5 mg all other days(T, Th, Sat, Sun)

## 2023-10-07 NOTE — Progress Notes (Signed)
 Call contact attempt to patient for introduction and PM medication plan review. - no answer/mailbox full.    1918---Inbound call received from patient. RN introduction, limited assessment, and self-administration medication plan, and [facility] contact information reviewed. Patient agreed to a video call at 2100. Patient identifiers completed, patient alert and oriented denied pain and confirmed is stable without concerns. Patient encouraged to call RN if needed before scheduled video call, patient will continue to be monitored.   1935-2nd call attempt to patient. Patient reached, daughter in law also available. RN    2100-- Video call contact. Patient identifier reviewed. Daughter in law and Son also present for support. Continued assessment, assistance for self-administration for night prescribed medications complete, patient continued to deny pain or health concerns. Denied need for any PRN medications at time of call. S/Sx of tachypnea, Warfarin safety, plan for when to call RN verses 911 for emergent needs related to significant of changes in condition and plan for morning call completed. Patient will continue to be monitored throughout the night.

## 2023-10-07 NOTE — Plan of Care (Signed)
  Problem: Education: Goal: Knowledge of General Education information will improve Description: Including pain rating scale, medication(s)/side effects and non-pharmacologic comfort measures Outcome: Progressing   Problem: Health Behavior/Discharge Planning: Goal: Ability to manage health-related needs will improve Outcome: Progressing   Problem: Clinical Measurements: Goal: Ability to maintain clinical measurements within normal limits will improve Outcome: Progressing Goal: Will remain free from infection Outcome: Progressing Goal: Diagnostic test results will improve Outcome: Progressing Goal: Cardiovascular complication will be avoided Outcome: Progressing   Problem: Activity: Goal: Risk for activity intolerance will decrease Outcome: Progressing   Problem: Nutrition: Goal: Adequate nutrition will be maintained Outcome: Progressing   Problem: Coping: Goal: Level of anxiety will decrease Outcome: Progressing   Problem: Elimination: Goal: Will not experience complications related to bowel motility Outcome: Progressing Goal: Will not experience complications related to urinary retention Outcome: Progressing   Problem: Pain Managment: Goal: General experience of comfort will improve and/or be controlled Outcome: Progressing   Problem: Safety: Goal: Ability to remain free from injury will improve Outcome: Progressing   Problem: Skin Integrity: Goal: Risk for impaired skin integrity will decrease Outcome: Progressing   Problem: Urinary Elimination: Goal: Signs and symptoms of infection will decrease Outcome: Progressing

## 2023-10-07 NOTE — Assessment & Plan Note (Addendum)
 10-07-2023 [facility]. Day #2 today. continue with IV rocephin  1 gram daily. Day #2. Urine cx pending. Pt states she has long standing anorexia. Will order some Ensure protein shakes to boost her nutrition while she is being treated for UTI.  10-08-2023 urine cx grew multiple species. Complete total of 3 days of IV rocephin . Today is Day #3. Stable for DC today.

## 2023-10-07 NOTE — Assessment & Plan Note (Addendum)
 10-07-2023 due to UTI. Now resolved.

## 2023-10-07 NOTE — Plan of Care (Signed)
  Problem: Clinical Measurements: Goal: Ability to maintain clinical measurements within normal limits will improve Problem: Safety: Goal: Ability to remain free from injury will improve Outcome: Progressing    Goal: Will remain free from infection Outcome: Progressing Goal: Diagnostic test results will improve Outcome: Progressing Goal: Cardiovascular complication will be avoided Outcome: Progressing     Problem: Urinary Elimination: Goal: Signs and symptoms of infection will decrease Outcome: Progressing   Problem: Education: Goal: Knowledge of General Education information will improve Description: Including pain rating scale, medication(s)/side effects and non-pharmacologic comfort measures Outcome: Progressing

## 2023-10-07 NOTE — Assessment & Plan Note (Addendum)
 10-07-2023 chronic. stable

## 2023-10-07 NOTE — Progress Notes (Signed)
 [facility] PROGRESS NOTE    [name]  FMW:[id number] DOB: 11-01-1932 DOA: 10/05/2023 PCP: Randeen Laine LABOR, MD  Subjective: Pt seen by virtual visit. Paramedic is Robin. No fevers. Feels congested in the head. No dyspnea on exertion. No odor to her urine. Urine is bright yellow and clear. Not much of an appetite. Drinking fluids well but not eating much solid food. Spoke with son Norleen who was also on virtual call. He states pt is back to her neuro baseline. No confusion.  Blood cx growing staph epidermidis. Pharmacy agrees that this is a contaminant. No abx therapy needed for contaminant blood cultures.   Provider Location: [place], [place] Patient examined by: Grayce, paramedic  [facility] Course: HPI: [name] is a 89 y.o. female with medical history significant for persistent A-fib on Coumadin , hypothyroidism, who presents to the ER due to confusion today.  Denies having any abdominal pain or diarrhea.  No reported fevers or chills.   In the ER, UA positive for pyuria.  Chest x-ray with changes of mild CHF.  BNP mildly elevated in the setting of obesity.  Started on aztreonam  in the ER due to severe penicillin allergy.  Also received a dose of IV Lasix  40 mg x 1.  Admitted by [facility], hospitalist service.   ED Course: Temperature 98.6.  BP 122/60, pulse 71, respiratory rate 12, O2 saturation 100% on room air.  Significant Events: Admitted 10/05/2023 for acute metabolic encephalopathy due to UTI   Admission Labs: UA hazy, nitrite negative, LE moderate, WBC 21-50 Na 140, K 3.9, CO2 of 25, BUN 23, Scr 1.04, glu 108 WBC 4.5, HgB 13.2, Plt 155 Covid/flu/RSV negative  Admission Imaging Studies: CXR show Changes of mild CHF.  CT head No acute intracranial abnormality. 2. 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 3. Acute on chronic right maxillary sinus disease has developed since 2021.  Significant Labs: BNP 155 INR 1.7  Significant Imaging Studies:   Antibiotic  Therapy: Anti-infectives (From admission, onward)    Start     Dose/Rate Route Frequency Ordered Stop   10/06/23 1000  aztreonam  (AZACTAM ) 2 g in sodium chloride  0.9 % 100 mL IVPB  Status:  Discontinued        2 g 200 mL/hr over 30 Minutes Intravenous Every 12 hours 10/06/23 0641 10/06/23 0646   10/06/23 1000  cefTRIAXone  (ROCEPHIN ) 1 g in sodium chloride  0.9 % 100 mL IVPB        1 g 200 mL/hr over 30 Minutes Intravenous Every 24 hours 10/06/23 0646     10/06/23 0030  aztreonam  (AZACTAM ) 2 g in sodium chloride  0.9 % 100 mL IVPB        2 g 200 mL/hr over 30 Minutes Intravenous  Once 10/06/23 0023 10/06/23 0138       Procedures:   Consultants:     Assessment and Plan: * Acute metabolic encephalopathy 10-07-2023 due to UTI. Now resolved.  Acute cystitis without hematuria 10-07-2023 continue with IV rocephin  1 gram daily. Day #2. Urine cx pending. Pt states she has long standing anorexia. Will order some Ensure protein shakes to boost her nutrition while she is being treated for UTI.  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 stable  Long term (current) use of anticoagulants - on coumadin  for afib 10-07-2023 check INR today. Pharmacy following for coumadin  dosing.  (HFpEF) heart failure with preserved ejection fraction (HCC) 10-07-2023 acute on chronic. Was treated with one dose of IV lasix .  Now appears to be euvolemic. Continue with aldactone , cozaar , lasix , bisoprolol . Check CMP, BNP today. Daily weights.  Atrial fibrillation, chronic (HCC) 10-07-2023 continue with coumadin . INR to be checked today. On mexitil  and bisoprolol . Admission EKG shows paced rhythm consistently with her known hx of PPM.  Acquired hypothyroidism 10-07-2023 stable. On synthroid .   DVT prophylaxis:  warfarin (COUMADIN ) tablet 6 mg     Code Status: Full Code Family Communication: discussed with pt and son Norleen Disposition Plan: home Reason for continuing need for  hospitalization: remains on IV Abx.  Objective: Vitals:   10/06/23 1935 10/06/23 2209 10/07/23 0432 10/07/23 0455  BP:      Pulse:  75 75 85  Resp:  13 15 10   Temp:      TempSrc:      SpO2: 98% 97% 97% 96%  Weight:      Height:       No intake or output data in the 24 hours ending 10/07/23 1012  Filed Weights   10/05/23 2204 10/06/23 1700  Weight: 79.4 kg 78.8 kg    Examination: Note that physical examination performed by paramedic on-scene and documented by provider Video Exam performed by video enabled technology  Physical Exam Vitals and nursing note reviewed.  Constitutional:      General: She is not in acute distress.    Appearance: She is normal weight. She is not toxic-appearing.  HENT:     Head: Normocephalic and atraumatic.  Eyes:     General: No scleral icterus. Cardiovascular:     Rate and Rhythm: Normal rate and regular rhythm.  Pulmonary:     Effort: Pulmonary effort is normal. No respiratory distress.     Breath sounds: Normal breath sounds. No wheezing.  Abdominal:     General: Bowel sounds are normal.     Palpations: Abdomen is soft.  Musculoskeletal:     Right lower leg: No edema.     Left lower leg: No edema.  Skin:    General: Skin is warm and dry.     Capillary Refill: Capillary refill takes less than 2 seconds.  Neurological:     General: No focal deficit present.     Mental Status: She is alert and oriented to person, place, and time. Mental status is at baseline.     Data Reviewed: I have personally reviewed following labs and imaging studies  CBC: Recent Labs  Lab 10/05/23 2214 10/06/23 0730  WBC 4.5 4.0  HGB 13.2 12.7  HCT 41.6 38.9  MCV 93.1 90.7  PLT 155 146*   Basic Metabolic Panel: Recent Labs  Lab 10/05/23 2214 10/06/23 0730  NA 140 142  K 3.9 4.2  CL 106 101  CO2 25 28  GLUCOSE 108* 91  BUN 23 20  CREATININE 1.04* 0.94  CALCIUM  9.2 9.0  MG  --  2.2  PHOS  --  3.3   GFR: Estimated Creatinine Clearance:  42.9 mL/min (by C-G formula based on SCr of 0.94 mg/dL). Liver Function Tests: Recent Labs  Lab 10/05/23 2214  AST 22  ALT 13  ALKPHOS 38  BILITOT 1.0  PROT 7.1  ALBUMIN  3.8   Coagulation Profile: Recent Labs  Lab 10/06/23 0730  INR 1.7*   BNP (last 3 results) Recent Labs    10/06/23 0021  BNP 155.1*   CBG: Recent Labs  Lab 10/05/23 2212  GLUCAP 109*    Recent Results (from the past 240 hours)  Resp panel by RT-PCR (RSV, Flu A&B,  Covid) Anterior Nasal Swab     Status: None   Collection Time: 10/05/23 11:50 PM   Specimen: Anterior Nasal Swab  Result Value Ref Range Status   SARS Coronavirus 2 by RT PCR NEGATIVE NEGATIVE Final   Influenza A by PCR NEGATIVE NEGATIVE Final   Influenza B by PCR NEGATIVE NEGATIVE Final    Comment: (NOTE) The Xpert Xpress SARS-CoV-2/FLU/RSV plus assay is intended as an aid in the diagnosis of influenza from Nasopharyngeal swab specimens and should not be used as a sole basis for treatment. Nasal washings and aspirates are unacceptable for Xpert Xpress SARS-CoV-2/FLU/RSV testing.  Fact Sheet for Patients: BloggerCourse.com  Fact Sheet for Healthcare Providers: SeriousBroker.it  This test is not yet approved or cleared by the [place]  FDA and has been authorized for detection and/or diagnosis of SARS-CoV-2 by FDA under an Emergency Use Authorization (EUA). This EUA will remain in effect (meaning this test can be used) for the duration of the COVID-19 declaration under Section 564(b)(1) of the Act, 21 U.S.C. section 360bbb-3(b)(1), unless the authorization is terminated or revoked.     Resp Syncytial Virus by PCR NEGATIVE NEGATIVE Final    Comment: (NOTE) Fact Sheet for Patients: BloggerCourse.com  Fact Sheet for Healthcare Providers: SeriousBroker.it  This test is not yet approved or cleared by the [place]  FDA  and has been authorized for detection and/or diagnosis of SARS-CoV-2 by FDA under an Emergency Use Authorization (EUA). This EUA will remain in effect (meaning this test can be used) for the duration of the COVID-19 declaration under Section 564(b)(1) of the Act, 21 U.S.C. section 360bbb-3(b)(1), unless the authorization is terminated or revoked.  Performed at [facility], [address], [place], [place] [address]   Blood culture (routine x 2)     Status: None (Preliminary result)   Collection Time: 10/06/23 12:09 AM   Specimen: BLOOD  Result Value Ref Range Status   Specimen Description BLOOD LEFT ANTECUBITAL  Final   Special Requests   Final    BOTTLES DRAWN AEROBIC AND ANAEROBIC Blood Culture adequate volume   Culture   Final    NO GROWTH 1 DAY Performed at [facility], [address] [place], [place] [address]    Report Status PENDING  Incomplete  Blood culture (routine x 2)     Status: Abnormal (Preliminary result)   Collection Time: 10/06/23 12:14 AM   Specimen: BLOOD RIGHT FOREARM  Result Value Ref Range Status   Specimen Description BLOOD RIGHT FOREARM  Final   Special Requests   Final    BOTTLES DRAWN AEROBIC AND ANAEROBIC Blood Culture adequate volume   Culture  Setup Time   Final    GRAM POSITIVE COCCI IN CLUSTERS ANAEROBIC BOTTLE ONLY CRITICAL RESULT CALLED TO, READ BACK BY AND VERIFIED WITH: RN ABIGAIL C 2256 [id number] FCP    Culture (A)  Final    STAPHYLOCOCCUS EPIDERMIDIS THE SIGNIFICANCE OF ISOLATING THIS ORGANISM FROM A SINGLE SET OF BLOOD CULTURES WHEN MULTIPLE SETS ARE DRAWN IS UNCERTAIN. PLEASE NOTIFY THE MICROBIOLOGY DEPARTMENT WITHIN ONE WEEK IF SPECIATION AND SENSITIVITIES ARE REQUIRED. Performed at [facility], [address] [place], [place] [address]    Report Status PENDING  Incomplete  Blood Culture ID Panel (Reflexed)     Status: Abnormal   Collection Time: 10/06/23 12:14 AM  Result Value Ref Range Status   Enterococcus  faecalis NOT DETECTED NOT DETECTED Final   Enterococcus Faecium NOT DETECTED NOT DETECTED Final  Listeria monocytogenes NOT DETECTED NOT DETECTED Final   Staphylococcus species DETECTED (A) NOT DETECTED Final    Comment: CRITICAL RESULT CALLED TO, READ BACK BY AND VERIFIED WITH: RN ABIGAIL C 2256 [id number] FCP    Staphylococcus aureus (BCID) NOT DETECTED NOT DETECTED Final   Staphylococcus epidermidis DETECTED (A) NOT DETECTED Final    Comment: CRITICAL RESULT CALLED TO, READ BACK BY AND VERIFIED WITH: RN ABIGAIL C 2256 [id number] FCP    Staphylococcus lugdunensis NOT DETECTED NOT DETECTED Final   Streptococcus species NOT DETECTED NOT DETECTED Final   Streptococcus agalactiae NOT DETECTED NOT DETECTED Final   Streptococcus pneumoniae NOT DETECTED NOT DETECTED Final   Streptococcus pyogenes NOT DETECTED NOT DETECTED Final   A.calcoaceticus-baumannii NOT DETECTED NOT DETECTED Final   Bacteroides fragilis NOT DETECTED NOT DETECTED Final   Enterobacterales NOT DETECTED NOT DETECTED Final   Enterobacter cloacae complex NOT DETECTED NOT DETECTED Final   Escherichia coli NOT DETECTED NOT DETECTED Final   Klebsiella aerogenes NOT DETECTED NOT DETECTED Final   Klebsiella oxytoca NOT DETECTED NOT DETECTED Final   Klebsiella pneumoniae NOT DETECTED NOT DETECTED Final   Proteus species NOT DETECTED NOT DETECTED Final   Salmonella species NOT DETECTED NOT DETECTED Final   Serratia marcescens NOT DETECTED NOT DETECTED Final   Haemophilus influenzae NOT DETECTED NOT DETECTED Final   Neisseria meningitidis NOT DETECTED NOT DETECTED Final   Pseudomonas aeruginosa NOT DETECTED NOT DETECTED Final   Stenotrophomonas maltophilia NOT DETECTED NOT DETECTED Final   Candida albicans NOT DETECTED NOT DETECTED Final   Candida auris NOT DETECTED NOT DETECTED Final   Candida glabrata NOT DETECTED NOT DETECTED Final   Candida krusei NOT DETECTED NOT DETECTED Final   Candida parapsilosis NOT DETECTED NOT DETECTED  Final   Candida tropicalis NOT DETECTED NOT DETECTED Final   Cryptococcus neoformans/gattii NOT DETECTED NOT DETECTED Final   Methicillin resistance mecA/C NOT DETECTED NOT DETECTED Final    Comment: Performed at [facility], [address], [place], [place] [address]     Radiology Studies: ECHOCARDIOGRAM COMPLETE Result Date: 10/06/2023    ECHOCARDIOGRAM REPORT   Patient Name:   [name] Date of Exam: 10/06/2023 Medical Rec #:  [id number]     Height:       65.0 in Accession #:    [id number]    Weight:       175.0 lb Date of Birth:  04-Aug-1932      BSA:          1.869 m Patient Age:    89 years      BP:           124/63 mmHg Patient Gender: F             HR:           76 bpm. Exam Location:  Inpatient Procedure: 2D Echo, Cardiac Doppler and Color Doppler (Both Spectral and Color            Flow Doppler were utilized during procedure). Indications:    CHF  History:        Patient has prior history of Echocardiogram examinations.  Sonographer:    Vella Key Referring Phys: SHONA LAURENCE, N IMPRESSIONS  1. Left ventricular ejection fraction, by estimation, is 55 to 60%. The left ventricle has normal function. The left ventricle demonstrates global hypokinesis. There is moderate concentric left ventricular hypertrophy. Left ventricular diastolic function could not be evaluated.  2. Right ventricular systolic function is  normal. The right ventricular size is normal.  3. Left atrial size was severely dilated.  4. Device lead seen. Right atrial size was severely dilated.  5. The mitral valve is normal in structure. Mild to moderate mitral valve regurgitation. Mild to moderate mitral stenosis. Moderate to severe mitral annular calcification.  6. Tricuspid valve regurgitation is moderate.  7. The aortic valve is calcified. There is moderate calcification of the aortic valve. There is moderate thickening of the aortic valve. Aortic valve regurgitation is trivial. Aortic valve sclerosis/calcification is  present, without any evidence of aortic stenosis.  8. The inferior vena cava is normal in size with greater than 50% respiratory variability, suggesting right atrial pressure of 3 mmHg. FINDINGS  Left Ventricle: Left ventricular ejection fraction, by estimation, is 55 to 60%. The left ventricle has normal function. The left ventricle demonstrates global hypokinesis. The left ventricular internal cavity size was normal in size. There is moderate concentric left ventricular hypertrophy. Left ventricular diastolic function could not be evaluated due to mitral annular calcification (moderate or greater). Left ventricular diastolic function could not be evaluated. Right Ventricle: The right ventricular size is normal. No increase in right ventricular wall thickness. Right ventricular systolic function is normal. Left Atrium: Left atrial size was severely dilated. Right Atrium: Device lead seen. Right atrial size was severely dilated. Pericardium: There is no evidence of pericardial effusion. Presence of epicardial fat layer. Mitral Valve: The mitral valve is normal in structure. Moderate to severe mitral annular calcification. Mild to moderate mitral valve regurgitation. Mild to moderate mitral valve stenosis. MV peak gradient, 12.2 mmHg. The mean mitral valve gradient is 6.0 mmHg. Tricuspid Valve: The tricuspid valve is normal in structure. Tricuspid valve regurgitation is moderate . No evidence of tricuspid stenosis. Aortic Valve: The aortic valve is calcified. There is moderate calcification of the aortic valve. There is moderate thickening of the aortic valve. There is moderate aortic valve annular calcification. Aortic valve regurgitation is trivial. Aortic valve sclerosis/calcification is present, without any evidence of aortic stenosis. Pulmonic Valve: The pulmonic valve was normal in structure. Pulmonic valve regurgitation is not visualized. No evidence of pulmonic stenosis. Aorta: The aortic root is normal in  size and structure. Venous: The inferior vena cava is normal in size with greater than 50% respiratory variability, suggesting right atrial pressure of 3 mmHg. IAS/Shunts: No atrial level shunt detected by color flow Doppler. Additional Comments: A device lead is visualized.  LEFT VENTRICLE PLAX 2D LVIDd:         4.10 cm     Diastology LVIDs:         3.50 cm     LV e' medial:  7.62 cm/s LV PW:         1.20 cm     LV e' lateral: 14.00 cm/s LV IVS:        1.20 cm LVOT diam:     1.70 cm LV SV:         39 LV SV Index:   21 LVOT Area:     2.27 cm  LV Volumes (MOD) LV vol d, MOD A2C: 51.7 ml LV vol d, MOD A4C: 67.4 ml LV vol s, MOD A2C: 18.3 ml LV vol s, MOD A4C: 30.2 ml LV SV MOD A2C:     33.4 ml LV SV MOD A4C:     67.4 ml LV SV MOD BP:      36.7 ml RIGHT VENTRICLE RV Basal diam:  3.60 cm RV S prime:  11.80 cm/s TAPSE (M-mode): 1.0 cm LEFT ATRIUM              Index         RIGHT ATRIUM           Index LA diam:        5.10 cm  2.73 cm/m    RA Area:     32.90 cm LA Vol (A2C):   195.0 ml 104.33 ml/m  RA Volume:   122.00 ml 65.28 ml/m LA Vol (A4C):   105.0 ml 56.18 ml/m LA Biplane Vol: 156.0 ml 83.47 ml/m  AORTIC VALVE LVOT Vmax:   82.00 cm/s LVOT Vmean:  59.200 cm/s LVOT VTI:    0.172 m  AORTA Ao Root diam: 3.20 cm Ao Asc diam:  3.40 cm MITRAL VALVE MV Area VTI:  0.98 cm    SHUNTS MV Peak grad: 12.2 mmHg   Systemic VTI:  0.17 m MV Mean grad: 6.0 mmHg    Systemic Diam: 1.70 cm MV Vmax:      1.75 m/s MV Vmean:     120.0 cm/s Kardie Tobb DO Electronically signed by Dub Huntsman DO Signature Date/Time: 10/06/2023/2:15:23 PM    Final    CT HEAD WO CONTRAST ( ) Result Date: 10/06/2023 CLINICAL DATA:  89 year old female with unexplained altered mental status. EXAM: CT HEAD WITHOUT CONTRAST TECHNIQUE: Contiguous axial images were obtained from the base of the skull through the vertex without intravenous contrast. RADIATION DOSE REDUCTION: This exam was performed according to the departmental dose-optimization program  which includes automated exposure control, adjustment of the mA and/or kV according to patient size and/or use of iterative reconstruction technique. COMPARISON:  Brain MRI 06/16/2019.  Head CT 03/02/2021. FINDINGS: Brain: Chronic posterior Right MCA territory infarct with encephalomalacia is stable on series 3, image 25. And a smaller area of chronic left occipital pole cortical encephalomalacia is stable on image 19. Background brain volume is normal for age. Elsewhere gray-white differentiation is normal for age. No midline shift, ventriculomegaly, mass effect, evidence of mass lesion, intracranial hemorrhage or evidence of cortically based acute infarction. Vascular: Calcified atherosclerosis at the skull base. No suspicious intracranial vascular hyperdensity. Skull: Intact. No acute osseous abnormality identified. Chronic left TMJ degeneration. Sinuses/Orbits: Right maxillary sinus mucoperiosteal thickening has developed since 2021. Other Visualized paranasal sinuses and mastoids are stable and well aerated. Other: No acute orbit or scalp soft tissue finding. IMPRESSION: 1. No acute intracranial abnormality. 2. 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 3. Acute on chronic right maxillary sinus disease has developed since 2021. Electronically Signed   By: VEAR Hurst M.D.   On: 10/06/2023 10:34   DG Chest Portable 1 View Result Date: 10/06/2023 CLINICAL DATA:  Shortness of breath EXAM: PORTABLE CHEST 1 VIEW COMPARISON:  09/02/2023 FINDINGS: Cardiac shadow is enlarged. Pacing device is again seen. Postsurgical changes are noted. Increased central vascular congestion is noted with interstitial edema. No focal infiltrate is noted. No bony abnormality is seen. IMPRESSION: Changes of mild CHF. Electronically Signed   By: Oneil Devonshire M.D.   On: 10/06/2023 00:15    Scheduled Meds:  bisoprolol   5 mg Oral Daily   feeding supplement  237 mL Oral BID BM   ferrous sulfate   325 mg Oral Q breakfast    furosemide   40 mg Oral Daily   levothyroxine   75 mcg Oral Q0600   losartan   25 mg Oral Daily   mexiletine  200 mg Oral BID   spironolactone   12.5  mg Oral Daily   warfarin  6 mg Oral ONCE-1800   Warfarin - Pharmacist Dosing Inpatient   Does not apply q1600   Continuous Infusions:  cefTRIAXone  (ROCEPHIN )  IV 1 g (10/07/23 0957)     LOS: 1 day   Time spent: 55 minutes  Camellia Door, DO  [facility]  10/07/2023, 10:12 AM

## 2023-10-07 NOTE — Subjective & Objective (Addendum)
 Pt seen by virtual visit. Paramedic is [name]. No fevers. Feels congested in the head. No dyspnea on exertion. No odor to her urine. Urine is bright yellow and clear. Not much of an appetite. Drinking fluids well but not eating much solid food. Spoke with son [name] who was also on virtual call. He states pt is back to her neuro baseline. No confusion.  Blood cx growing staph epidermidis. Pharmacy agrees that this is a contaminant. No abx therapy needed for contaminant blood cultures.

## 2023-10-07 NOTE — Progress Notes (Addendum)
 Current Health Data Alert: Tachypnea: Patient called, unable to reach on mobile number per.  0353: Current Health Data Alert: Tachypnea: Outbound call made to caregiver/son on mobile number, unable to reach. Message left to call RN at [phone]  0357: APP notified of Current Health Data Alert for Tachypnea and outcome of outbound calls to patient and caregiver/Son. APP informed No changes in SP02 and HR stable, new orders. RN will continue to monitor and update APP with significant changes to other current health data.   0428--Current Health Data Alert: Tachypnea: Call to patient's mobile number, Son/caregiver answered, checked on patient. Both patient and caregiver confirmed patient is stable without signs of distress. Son confirmed patient was sleeping flat on her back, patient encouraged to change positions. Patient and caregiver/son informed of updated current health data changes back to  normal parameters while on  phone call with position change. Patient will continue to be monitored.   0614--Patient remained stable for remainder of shift, no additional alerts or outreach to RN. APP notified of update.

## 2023-10-07 NOTE — Plan of Care (Signed)
  Problem: Education: Goal: Knowledge of General Education information will improve Description: Including pain rating scale, medication(s)/side effects and non-pharmacologic comfort measures Outcome: Progressing   Problem: Nutrition: Goal: Adequate nutrition will be maintained Outcome: Progressing   Problem: Safety: Goal: Ability to remain free from injury will improve Outcome: Progressing   Problem: Urinary Elimination: Goal: Signs and symptoms of infection will decrease Outcome: Progressing   Problem: Clinical Measurements: Goal: Ability to maintain clinical measurements within normal limits will improve Outcome: Progressing   Problem: Clinical Measurements: Goal: Cardiovascular complication will be avoided Outcome: Progressing

## 2023-10-07 NOTE — Hospital Course (Signed)
 HPI: [name] is a 89 y.o. female with medical history significant for persistent A-fib on Coumadin , hypothyroidism, who presents to the ER due to confusion today.  Denies having any abdominal pain or diarrhea.  No reported fevers or chills.   In the ER, UA positive for pyuria.  Chest x-ray with changes of mild CHF.  BNP mildly elevated in the setting of obesity.  Started on aztreonam  in the ER due to severe penicillin allergy.  Also received a dose of IV Lasix  40 mg x 1.  Admitted by [facility], hospitalist service.   ED Course: Temperature 98.6.  BP 122/60, pulse 71, respiratory rate 12, O2 saturation 100% on room air.  Significant Events: Admitted 10/05/2023 for acute metabolic encephalopathy due to UTI   Admission Labs: UA hazy, nitrite negative, LE moderate, WBC 21-50 Na 140, K 3.9, CO2 of 25, BUN 23, Scr 1.04, glu 108 WBC 4.5, HgB 13.2, Plt 155 Covid/flu/RSV negative  Admission Imaging Studies: CXR show Changes of mild CHF.  CT head No acute intracranial abnormality. 2. 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 3. Acute on chronic right maxillary sinus disease has developed since 2021.  Significant Labs: BNP 155 INR 1.7  Significant Imaging Studies:   Antibiotic Therapy: Anti-infectives (From admission, onward)    Start     Dose/Rate Route Frequency Ordered Stop   10/06/23 1000  aztreonam  (AZACTAM ) 2 g in sodium chloride  0.9 % 100 mL IVPB  Status:  Discontinued        2 g 200 mL/hr over 30 Minutes Intravenous Every 12 hours 10/06/23 0641 10/06/23 0646   10/06/23 1000  cefTRIAXone  (ROCEPHIN ) 1 g in sodium chloride  0.9 % 100 mL IVPB        1 g 200 mL/hr over 30 Minutes Intravenous Every 24 hours 10/06/23 0646     10/06/23 0030  aztreonam  (AZACTAM ) 2 g in sodium chloride  0.9 % 100 mL IVPB        2 g 200 mL/hr over 30 Minutes Intravenous  Once 10/06/23 0023 10/06/23 0138       Procedures:   Consultants:

## 2023-10-07 NOTE — Progress Notes (Signed)
 PHARMACY - ANTICOAGULATION CONSULT NOTE  Pharmacy Consult for warfarin Indication: atrial fibrillation  Labs: Recent Labs    10/05/23 2214 10/06/23 0730  HGB 13.2 12.7  HCT 41.6 38.9  PLT 155 146*  LABPROT  --  20.8*  INR  --  1.7*  CREATININE 1.04* 0.94   Assessment: 89yo female on warfarin PTA for Afib, continued while admitted, currently with subtherapeutic INR.  Goal of Therapy:  INR 2-3   Plan:  Warfarin 6mg  PO x1 today. Monitor INR.  Marvetta Dauphin, PharmD, BCPS 10/07/2023 1:11 AM

## 2023-10-07 NOTE — Progress Notes (Signed)
 Communicated with patient via video cam. Patient appears comfortable sitting on the bed. Aox4. No complaints of pain or discomfort. Denies headache, dizziness, and SOB. No reports of burning with urination. Plan of care reviewed with patient and daughter-in-law. No further needs at this time.

## 2023-10-07 NOTE — Assessment & Plan Note (Addendum)
 10-07-2023 continue with coumadin . INR to be checked today. On mexitil  and bisoprolol . Admission EKG shows paced rhythm consistently with her known hx of PPM.  10-08-2023 stable. INR 1.4 yesterday. Given 6 mg coumadin . At discharge, she will resume her normal coumadin  dosing. 2.5 mg (5 mg x 0.5) every Mon, Wed, Fri; 5 mg (5 mg x 1) all other days   *update. INR 1.6 today. She can resume her 2.5 mg on M, W, F and 5 mg on T, Th, Sat, Sunday

## 2023-10-07 NOTE — Assessment & Plan Note (Addendum)
 10-07-2023 stable. On synthroid .  10-08-2023 stable. On synthroid .

## 2023-10-07 NOTE — Assessment & Plan Note (Addendum)
 10-07-2023 acute on chronic. Was treated with one dose of IV lasix . Now appears to be euvolemic. Continue with aldactone , cozaar , lasix , bisoprolol . Check CMP, BNP today. Daily weights.  10-08-2023 resolved. Stable. Discharge Dry Weight 175 lbs.

## 2023-10-08 DIAGNOSIS — Z950 Presence of cardiac pacemaker: Secondary | ICD-10-CM

## 2023-10-08 DIAGNOSIS — Z7901 Long term (current) use of anticoagulants: Secondary | ICD-10-CM

## 2023-10-08 LAB — CULTURE, BLOOD (ROUTINE X 2): Special Requests: ADEQUATE

## 2023-10-08 LAB — PROTIME-INR
INR: 1.6 — ABNORMAL HIGH (ref 0.8–1.2)
Prothrombin Time: 19.8 s — ABNORMAL HIGH (ref 11.4–15.2)

## 2023-10-08 MED ORDER — WARFARIN SODIUM 5 MG PO TABS: 2.5000 mg | ORAL_TABLET | ORAL | Status: DC

## 2023-10-08 MED ORDER — WARFARIN SODIUM 5 MG PO TABS
5.0000 mg | ORAL_TABLET | Freq: Once | ORAL | Status: DC
  Filled 2023-10-08: qty 1

## 2023-10-08 MED ORDER — LORATADINE 10 MG PO TABS
10.0000 mg | ORAL_TABLET | Freq: Every day | ORAL | Status: DC
  Filled 2023-10-08 (×2): qty 1

## 2023-10-08 NOTE — Plan of Care (Signed)
 Patient alert & oriented x 3. At son's home and being discharged today. All VS WNL.  Family present for discharge instructions reviewed and all questions answered to patient's satisfaction. Educated patient on heart failure, managing any swelling and when to contact provider or see the ED. Medication administration and review of listed medications completed. Reviewed shower safety. Patient expressed a high level of satisfaction with the service and staff.      Problem: Education: Goal: Knowledge of General Education information will improve Description: Including pain rating scale, medication(s)/side effects and non-pharmacologic comfort measures Outcome: Progressing   Problem: Clinical Measurements: Goal: Cardiovascular complication will be avoided Outcome: Progressing   Problem: Activity: Goal: Risk for activity intolerance will decrease Outcome: Progressing   Problem: Safety: Goal: Ability to remain free from injury will improve Outcome: Progressing   Problem: Clinical Measurements: Goal: Ability to maintain clinical measurements within normal limits will improve Outcome: Adequate for Discharge Goal: Will remain free from infection Outcome: Adequate for Discharge   Problem: Nutrition: Goal: Adequate nutrition will be maintained Outcome: Adequate for Discharge   Problem: Elimination: Goal: Will not experience complications related to bowel motility Outcome: Adequate for Discharge Goal: Will not experience complications related to urinary retention Outcome: Adequate for Discharge   Problem: Pain Managment: Goal: General experience of comfort will improve and/or be controlled Outcome: Adequate for Discharge   Problem: Skin Integrity: Goal: Risk for impaired skin integrity will decrease Outcome: Adequate for Discharge   Problem: Urinary Elimination: Goal: Signs and symptoms of infection will decrease Outcome: Adequate for Discharge

## 2023-10-08 NOTE — Progress Notes (Signed)
 Arrived to the home to find the PT sitting on the side of bed, no distress noted. PT is alert and oriented to her baseline. PT denied any SOB or difficulty breathing. PT c/o post nasal drip but denied any other complaints. PT stated that she slept fine throughout the night.   Physical Ax should negative DCAPBTLS to the head, neck, or face. PT denied any headache. PERRLA. Negative JVD or tracheal deviation. Chest expansion is  equal with non-labored breathing. L/S were C/E in all fields. Heart tones were normal. PT denied any ABD pain or diarrhea. PT denied any dysuria or hematuria. Negative for pedal or peripheral edema. PT c/o dry skin but states that is normal for her. Negative tenting on skin turgor. PT was able to ambulate without assistance and with a normal gait.   Vitals were obtained and documented accordingly. IV was patent and flushed. IV Rocephin  was administered. All oral medications were administered without incident. IV blood draw was performed in left AC.   PT had meeting with Dr. Laurence with expectant discharge being this afternoon. PT denied having any questions or concerns. PT's son was informed of the conversation and agreed with the discharge.   PT had expressed meals and food with low sodium. Paramedic Hyacinth stated that she would bring some literature on it and some meal ideas for breakfast as the PT often doesn't eat in the morning.

## 2023-10-08 NOTE — Progress Notes (Addendum)
 PROGRESS NOTE    [name]  FMW:[id number] DOB: [birth year] DOA: 10/05/2023 PCP: Randeen Laine LABOR, MD  Subjective: Pt seen by virtual visit. Paramedic is Robin. No fevers. Feels congested in the head. No dyspnea on exertion. No odor to her urine. Urine is bright yellow and clear. Not much of an appetite. Drinking fluids well but not eating much solid food. Spoke with son [name] who was also on virtual call. He states pt is back to her neuro baseline. No confusion.  Blood cx growing staph epidermidis. Pharmacy agrees that this is a contaminant. No abx therapy needed for contaminant blood cultures.   Provider Location: [place], [place] Patient examined by: [place] Course: HPI: [name] is a 89 y.o. female with medical history significant for persistent A-fib on Coumadin , hypothyroidism, who presents to the ER due to confusion today.  Denies having any abdominal pain or diarrhea.  No reported fevers or chills.   In the ER, UA positive for pyuria.  Chest x-ray with changes of mild CHF.  BNP mildly elevated in the setting of obesity.  Started on aztreonam  in the ER due to severe penicillin allergy.  Also received a dose of IV Lasix  40 mg x 1.  Admitted by [facility], hospitalist service.   ED Course: Temperature 98.6.  BP 122/60, pulse 71, respiratory rate 12, O2 saturation 100% on room air.  Significant Events: Admitted 10/05/2023 for acute metabolic encephalopathy due to UTI   Admission Labs: UA hazy, nitrite negative, LE moderate, WBC 21-50 Na 140, K 3.9, CO2 of 25, BUN 23, Scr 1.04, glu 108 WBC 4.5, HgB 13.2, Plt 155 Covid/flu/RSV negative  Admission Imaging Studies: CXR show Changes of mild CHF.  CT head No acute intracranial abnormality. 2. 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 3. Acute on chronic right maxillary sinus disease has developed since 2021.  Significant Labs: BNP 155 INR 1.7  Significant Imaging Studies:   Antibiotic  Therapy: Anti-infectives (From admission, onward)    Start     Dose/Rate Route Frequency Ordered Stop   10/06/23 1000  aztreonam  (AZACTAM ) 2 g in sodium chloride  0.9 % 100 mL IVPB  Status:  Discontinued        2 g 200 mL/hr over 30 Minutes Intravenous Every 12 hours 10/06/23 0641 10/06/23 0646   10/06/23 1000  cefTRIAXone  (ROCEPHIN ) 1 g in sodium chloride  0.9 % 100 mL IVPB        1 g 200 mL/hr over 30 Minutes Intravenous Every 24 hours 10/06/23 0646     10/06/23 0030  aztreonam  (AZACTAM ) 2 g in sodium chloride  0.9 % 100 mL IVPB        2 g 200 mL/hr over 30 Minutes Intravenous  Once 10/06/23 0023 10/06/23 0138       Procedures:   Consultants:     Assessment and Plan: * Acute metabolic encephalopathy-resolved as of 10/08/2023 10-07-2023 due to UTI. Now resolved.  Acute cystitis without hematuria 10-07-2023 continue with IV rocephin  1 gram daily. Day #2. Urine cx pending. Pt states she has long standing anorexia. Will order some Ensure protein shakes to boost her nutrition while she is being treated for UTI.  10-08-2023 urine cx grew multiple species. Complete total of 3 days of IV rocephin . Today is Day #3. Stable for DC today.  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 Stable  10-08-2023 stable.  Long term (current) use of anticoagulants - on coumadin  for afib 10-07-2023 check INR today.  Pharmacy following for coumadin  dosing.  10-08-2023 INR drawn today. Will resume her home dosing of coumadin  at discharge. 2.5 mg every Mon, Wed, Fri; 5 mg all other days(T, Th, Sat, Sun)  (HFpEF) heart failure with preserved ejection fraction (HCC) 10-07-2023 acute on chronic. Was treated with one dose of IV lasix . Now appears to be euvolemic. Continue with aldactone , cozaar , lasix , bisoprolol . Check CMP, BNP today. Daily weights.  10-08-2023 resolved. Stable. Weight 175 lbs.  Atrial fibrillation, chronic (HCC) 10-07-2023 continue with coumadin . INR to be  checked today. On mexitil  and bisoprolol . Admission EKG shows paced rhythm consistently with her known hx of PPM.  10-08-2023 stable. INR 1.4 yesterday. Given 6 mg coumadin . At discharge, she will resume her normal coumadin  dosing. 2.5 mg (5 mg x 0.5) every Mon, Wed, Fri; 5 mg (5 mg x 1) all other days   Acquired hypothyroidism 10-07-2023 stable. On synthroid .  10-08-2023 stable. On synthroid .  DVT prophylaxis:  warfarin (COUMADIN ) tablet 5 mg     Code Status: Full Code Family Communication: no family members available during video call Disposition Plan: home Reason for continuing need for hospitalization: medically stable  Objective: Vitals:   10/08/23 0204 10/08/23 0301 10/08/23 0321 10/08/23 0946  BP:    (!) 145/82  Pulse: 71 70 83 73  Resp: 14 14 19 15   Temp:    (!) 97.5 F (36.4 C)  TempSrc:    Oral  SpO2: 97% 96% 99% 98%  Weight:    79.5 kg  Height:       No intake or output data in the 24 hours ending 10/08/23 1108 Filed Weights   10/07/23 0500 10/07/23 0940 10/08/23 0946  Weight: 79.4 kg 79.4 kg 79.5 kg   Examination: Note that physical examination performed by paramedic on-scene and documented by provider Video Exam performed by video enabled technology  Examination:  Physical Exam Vitals and nursing note reviewed.  Constitutional:      General: She is not in acute distress.    Appearance: She is not toxic-appearing or diaphoretic.  HENT:     Head: Normocephalic and atraumatic.  Cardiovascular:     Rate and Rhythm: Normal rate and regular rhythm.  Pulmonary:     Effort: Pulmonary effort is normal.     Breath sounds: Normal breath sounds.  Abdominal:     General: Bowel sounds are normal. There is no distension.     Palpations: Abdomen is soft.  Skin:    General: Skin is warm and dry.     Capillary Refill: Capillary refill takes less than 2 seconds.     Comments: No wounds or ulcers  Neurological:     Mental Status: She is alert and oriented to  person, place, and time.   Data Reviewed: I have personally reviewed following labs and imaging studies  CBC: Recent Labs  Lab 10/05/23 2214 10/06/23 0730 10/07/23 1040  WBC 4.5 4.0 4.3  HGB 13.2 12.7 13.1  HCT 41.6 38.9 41.1  MCV 93.1 90.7 91.3  PLT 155 146* 148*   Basic Metabolic Panel: Recent Labs  Lab 10/05/23 2214 10/06/23 0730 10/07/23 1040  NA 140 142 142  K 3.9 4.2 4.0  CL 106 101 104  CO2 25 28 27   GLUCOSE 108* 91 94  BUN 23 20 20   CREATININE 1.04* 0.94 1.03*  CALCIUM  9.2 9.0 9.3  MG  --  2.2  --   PHOS  --  3.3  --    GFR: Estimated Creatinine Clearance:  39.3 mL/min (A) (by C-G formula based on SCr of 1.03 mg/dL (H)). Liver Function Tests: Recent Labs  Lab 10/05/23 2214 10/07/23 1040  AST 22 19  ALT 13 13  ALKPHOS 38 36*  BILITOT 1.0 0.7  PROT 7.1 7.1  ALBUMIN  3.8 3.5   Coagulation Profile: Recent Labs  Lab 10/06/23 0730 10/07/23 1040  INR 1.7* 1.4*   BNP (last 3 results) Recent Labs    10/06/23 0021 10/07/23 1040  BNP 155.1* 114.0*   CBG: Recent Labs  Lab 10/05/23 2212  GLUCAP 109*   Recent Results (from the past 240 hours)  Urine Culture (for pregnant, neutropenic or urologic patients or patients with an indwelling urinary catheter)     Status: Abnormal   Collection Time: 10/05/23 10:08 PM   Specimen: Urine, Clean Catch  Result Value Ref Range Status   Specimen Description URINE, CLEAN CATCH  Final   Special Requests   Final    NONE Performed at [facility], [address], [place], [place] [address]    Culture MULTIPLE SPECIES PRESENT, SUGGEST RECOLLECTION (A)  Final   Report Status 10/07/2023 FINAL  Final  Resp panel by RT-PCR (RSV, Flu A&B, Covid) Anterior Nasal Swab     Status: None   Collection Time: 10/05/23 11:50 PM   Specimen: Anterior Nasal Swab  Result Value Ref Range Status   SARS Coronavirus 2 by RT PCR NEGATIVE NEGATIVE Final   Influenza A by PCR NEGATIVE NEGATIVE Final   Influenza B by PCR NEGATIVE  NEGATIVE Final    Comment: (NOTE) The Xpert Xpress SARS-CoV-2/FLU/RSV plus assay is intended as an aid in the diagnosis of influenza from Nasopharyngeal swab specimens and should not be used as a sole basis for treatment. Nasal washings and aspirates are unacceptable for Xpert Xpress SARS-CoV-2/FLU/RSV testing.  Fact Sheet for Patients: BloggerCourse.com  Fact Sheet for Healthcare Providers: SeriousBroker.it  This test is not yet approved or cleared by the [place]  FDA and has been authorized for detection and/or diagnosis of SARS-CoV-2 by FDA under an Emergency Use Authorization (EUA). This EUA will remain in effect (meaning this test can be used) for the duration of the COVID-19 declaration under Section 564(b)(1) of the Act, 21 U.S.C. section 360bbb-3(b)(1), unless the authorization is terminated or revoked.     Resp Syncytial Virus by PCR NEGATIVE NEGATIVE Final    Comment: (NOTE) Fact Sheet for Patients: BloggerCourse.com  Fact Sheet for Healthcare Providers: SeriousBroker.it  This test is not yet approved or cleared by the [place]  FDA and has been authorized for detection and/or diagnosis of SARS-CoV-2 by FDA under an Emergency Use Authorization (EUA). This EUA will remain in effect (meaning this test can be used) for the duration of the COVID-19 declaration under Section 564(b)(1) of the Act, 21 U.S.C. section 360bbb-3(b)(1), unless the authorization is terminated or revoked.  Performed at [facility], [address], [place], [place] [address]   Blood culture (routine x 2)     Status: None (Preliminary result)   Collection Time: 10/06/23 12:09 AM   Specimen: BLOOD  Result Value Ref Range Status   Specimen Description BLOOD LEFT ANTECUBITAL  Final   Special Requests   Final    BOTTLES DRAWN AEROBIC AND ANAEROBIC Blood Culture adequate volume    Culture   Final    NO GROWTH 2 DAYS Performed at [facility], [address] [place], [place] [address]    Report Status PENDING  Incomplete  Blood culture (routine x 2)     Status: Abnormal   Collection Time: 10/06/23 12:14 AM   Specimen: BLOOD RIGHT FOREARM  Result Value Ref Range Status   Specimen Description BLOOD RIGHT FOREARM  Final   Special Requests   Final    BOTTLES DRAWN AEROBIC AND ANAEROBIC Blood Culture adequate volume   Culture  Setup Time   Final    GRAM POSITIVE COCCI IN CLUSTERS ANAEROBIC BOTTLE ONLY CRITICAL RESULT CALLED TO, READ BACK BY AND VERIFIED WITH: RN ABIGAIL C 2256 [id number] FCP    Culture (A)  Final    STAPHYLOCOCCUS EPIDERMIDIS THE SIGNIFICANCE OF ISOLATING THIS ORGANISM FROM A SINGLE SET OF BLOOD CULTURES WHEN MULTIPLE SETS ARE DRAWN IS UNCERTAIN. PLEASE NOTIFY THE MICROBIOLOGY DEPARTMENT WITHIN ONE WEEK IF SPECIATION AND SENSITIVITIES ARE REQUIRED. Performed at [facility], [address] [place], [place] [address]    Report Status 10/08/2023 FINAL  Final  Blood Culture ID Panel (Reflexed)     Status: Abnormal   Collection Time: 10/06/23 12:14 AM  Result Value Ref Range Status   Enterococcus faecalis NOT DETECTED NOT DETECTED Final   Enterococcus Faecium NOT DETECTED NOT DETECTED Final   Listeria monocytogenes NOT DETECTED NOT DETECTED Final   Staphylococcus species DETECTED (A) NOT DETECTED Final    Comment: CRITICAL RESULT CALLED TO, READ BACK BY AND VERIFIED WITH: RN ABIGAIL C 2256 [id number] FCP    Staphylococcus aureus (BCID) NOT DETECTED NOT DETECTED Final   Staphylococcus epidermidis DETECTED (A) NOT DETECTED Final    Comment: CRITICAL RESULT CALLED TO, READ BACK BY AND VERIFIED WITH: RN ABIGAIL C 2256 [id number] FCP    Staphylococcus lugdunensis NOT DETECTED NOT DETECTED Final   Streptococcus species NOT DETECTED NOT DETECTED Final   Streptococcus agalactiae NOT DETECTED NOT DETECTED Final   Streptococcus pneumoniae NOT DETECTED  NOT DETECTED Final   Streptococcus pyogenes NOT DETECTED NOT DETECTED Final   A.calcoaceticus-baumannii NOT DETECTED NOT DETECTED Final   Bacteroides fragilis NOT DETECTED NOT DETECTED Final   Enterobacterales NOT DETECTED NOT DETECTED Final   Enterobacter cloacae complex NOT DETECTED NOT DETECTED Final   Escherichia coli NOT DETECTED NOT DETECTED Final   Klebsiella aerogenes NOT DETECTED NOT DETECTED Final   Klebsiella oxytoca NOT DETECTED NOT DETECTED Final   Klebsiella pneumoniae NOT DETECTED NOT DETECTED Final   Proteus species NOT DETECTED NOT DETECTED Final   Salmonella species NOT DETECTED NOT DETECTED Final   Serratia marcescens NOT DETECTED NOT DETECTED Final   Haemophilus influenzae NOT DETECTED NOT DETECTED Final   Neisseria meningitidis NOT DETECTED NOT DETECTED Final   Pseudomonas aeruginosa NOT DETECTED NOT DETECTED Final   Stenotrophomonas maltophilia NOT DETECTED NOT DETECTED Final   Candida albicans NOT DETECTED NOT DETECTED Final   Candida auris NOT DETECTED NOT DETECTED Final   Candida glabrata NOT DETECTED NOT DETECTED Final   Candida krusei NOT DETECTED NOT DETECTED Final   Candida parapsilosis NOT DETECTED NOT DETECTED Final   Candida tropicalis NOT DETECTED NOT DETECTED Final   Cryptococcus neoformans/gattii NOT DETECTED NOT DETECTED Final   Methicillin resistance mecA/C NOT DETECTED NOT DETECTED Final    Comment: Performed at [facility], [address] [place], [place] [address]     Radiology Studies: ECHOCARDIOGRAM COMPLETE Result Date: 10/06/2023    ECHOCARDIOGRAM REPORT   Patient Name:   JAKALA HERFORD Date of Exam: 10/06/2023 Medical Rec #:  [id number]     Height:       65.0  in Accession #:    [id number]    Weight:       175.0 lb Date of Birth:  Jun 07, 1932      BSA:          1.869 m Patient Age:    89 years      BP:           124/63 mmHg Patient Gender: F             HR:           76 bpm. Exam Location:  Inpatient Procedure: 2D Echo, Cardiac Doppler and  Color Doppler (Both Spectral and Color            Flow Doppler were utilized during procedure). Indications:    CHF  History:        Patient has prior history of Echocardiogram examinations.  Sonographer:    Vella Key Referring Phys: SHONA LAURENCE, N IMPRESSIONS  1. Left ventricular ejection fraction, by estimation, is 55 to 60%. The left ventricle has normal function. The left ventricle demonstrates global hypokinesis. There is moderate concentric left ventricular hypertrophy. Left ventricular diastolic function could not be evaluated.  2. Right ventricular systolic function is normal. The right ventricular size is normal.  3. Left atrial size was severely dilated.  4. Device lead seen. Right atrial size was severely dilated.  5. The mitral valve is normal in structure. Mild to moderate mitral valve regurgitation. Mild to moderate mitral stenosis. Moderate to severe mitral annular calcification.  6. Tricuspid valve regurgitation is moderate.  7. The aortic valve is calcified. There is moderate calcification of the aortic valve. There is moderate thickening of the aortic valve. Aortic valve regurgitation is trivial. Aortic valve sclerosis/calcification is present, without any evidence of aortic stenosis.  8. The inferior vena cava is normal in size with greater than 50% respiratory variability, suggesting right atrial pressure of 3 mmHg. FINDINGS  Left Ventricle: Left ventricular ejection fraction, by estimation, is 55 to 60%. The left ventricle has normal function. The left ventricle demonstrates global hypokinesis. The left ventricular internal cavity size was normal in size. There is moderate concentric left ventricular hypertrophy. Left ventricular diastolic function could not be evaluated due to mitral annular calcification (moderate or greater). Left ventricular diastolic function could not be evaluated. Right Ventricle: The right ventricular size is normal. No increase in right ventricular wall thickness.  Right ventricular systolic function is normal. Left Atrium: Left atrial size was severely dilated. Right Atrium: Device lead seen. Right atrial size was severely dilated. Pericardium: There is no evidence of pericardial effusion. Presence of epicardial fat layer. Mitral Valve: The mitral valve is normal in structure. Moderate to severe mitral annular calcification. Mild to moderate mitral valve regurgitation. Mild to moderate mitral valve stenosis. MV peak gradient, 12.2 mmHg. The mean mitral valve gradient is 6.0 mmHg. Tricuspid Valve: The tricuspid valve is normal in structure. Tricuspid valve regurgitation is moderate . No evidence of tricuspid stenosis. Aortic Valve: The aortic valve is calcified. There is moderate calcification of the aortic valve. There is moderate thickening of the aortic valve. There is moderate aortic valve annular calcification. Aortic valve regurgitation is trivial. Aortic valve sclerosis/calcification is present, without any evidence of aortic stenosis. Pulmonic Valve: The pulmonic valve was normal in structure. Pulmonic valve regurgitation is not visualized. No evidence of pulmonic stenosis. Aorta: The aortic root is normal in size and structure. Venous: The inferior vena cava is normal in size with greater than 50% respiratory  variability, suggesting right atrial pressure of 3 mmHg. IAS/Shunts: No atrial level shunt detected by color flow Doppler. Additional Comments: A device lead is visualized.  LEFT VENTRICLE PLAX 2D LVIDd:         4.10 cm     Diastology LVIDs:         3.50 cm     LV e' medial:  7.62 cm/s LV PW:         1.20 cm     LV e' lateral: 14.00 cm/s LV IVS:        1.20 cm LVOT diam:     1.70 cm LV SV:         39 LV SV Index:   21 LVOT Area:     2.27 cm  LV Volumes (MOD) LV vol d, MOD A2C: 51.7 ml LV vol d, MOD A4C: 67.4 ml LV vol s, MOD A2C: 18.3 ml LV vol s, MOD A4C: 30.2 ml LV SV MOD A2C:     33.4 ml LV SV MOD A4C:     67.4 ml LV SV MOD BP:      36.7 ml RIGHT VENTRICLE RV  Basal diam:  3.60 cm RV S prime:     11.80 cm/s TAPSE (M-mode): 1.0 cm LEFT ATRIUM              Index         RIGHT ATRIUM           Index LA diam:        5.10 cm  2.73 cm/m    RA Area:     32.90 cm LA Vol (A2C):   195.0 ml 104.33 ml/m  RA Volume:   122.00 ml 65.28 ml/m LA Vol (A4C):   105.0 ml 56.18 ml/m LA Biplane Vol: 156.0 ml 83.47 ml/m  AORTIC VALVE LVOT Vmax:   82.00 cm/s LVOT Vmean:  59.200 cm/s LVOT VTI:    0.172 m  AORTA Ao Root diam: 3.20 cm Ao Asc diam:  3.40 cm MITRAL VALVE MV Area VTI:  0.98 cm    SHUNTS MV Peak grad: 12.2 mmHg   Systemic VTI:  0.17 m MV Mean grad: 6.0 mmHg    Systemic Diam: 1.70 cm MV Vmax:      1.75 m/s MV Vmean:     120.0 cm/s Kardie Tobb DO Electronically signed by Kardie Tobb DO Signature Date/Time: 10/06/2023/2:15:23 PM    Final     Scheduled Meds:  bisoprolol   5 mg Oral Daily   feeding supplement  237 mL Oral BID BM   ferrous sulfate   325 mg Oral Q breakfast   furosemide   40 mg Oral Daily   levothyroxine   75 mcg Oral Q0600   loratadine   10 mg Oral Daily   losartan   25 mg Oral Daily   mexiletine  200 mg Oral BID   spironolactone   12.5 mg Oral Daily   warfarin  5 mg Oral ONCE-1800   Warfarin - Pharmacist Dosing Inpatient   Does not apply q1600   Continuous Infusions:  cefTRIAXone  (ROCEPHIN )  IV Stopped (10/08/23 0941)     LOS: 2 days   Time spent: 55 minutes  Camellia Door, DO  [facility]  10/08/2023, 11:08 AM

## 2023-10-08 NOTE — Progress Notes (Addendum)
 PHARMACY - ANTICOAGULATION CONSULT NOTE  Pharmacy Consult for warfarin Indication: atrial fibrillation  Labs: Recent Labs    10/05/23 2214 10/06/23 0730 10/07/23 1040  HGB 13.2 12.7 13.1  HCT 41.6 38.9 41.1  PLT 155 146* 148*  LABPROT  --  20.8* 18.3*  INR  --  1.7* 1.4*  CREATININE 1.04* 0.94 1.03*   Assessment: 89yo female on warfarin PTA for Afib, continued while admitted, currently with subtherapeutic INR.  No known missed doses but INR continues to decrease.  Since 7/16 pt has been administered doses with 6mg  (~20%) more than her standard weekly dose.  Will give 2.5mg  extra today for total 8.5mg  (~30%) above standard weekly dose (27.5mg  as 2.5mg  MWF and 5mg  TTSS).  Pt has moderate thromboembolic risk while INR <2; could consider bridging with LMWH.  Goal of Therapy:  INR 2-3   Plan:  Warfarin 5mg  PO x1 today. Monitor INR.  Marvetta Dauphin, PharmD, BCPS 10/08/2023 5:57 AM

## 2023-10-08 NOTE — Progress Notes (Signed)
 INR had to be re-drawn. Re-sent to the lab.

## 2023-10-08 NOTE — Progress Notes (Addendum)
 Current Health Alert Tachypnea: On call APP notified. Patient has intermittent Tachypnea at rest, now changes to other vital signs. Patient appears stable through current health data. Patient will continue to be monitored for need for escalation. No new orders resulting for APP patient update and plan.   Resp rate 16rpm Jul 18, 1:32am SPO2 98% Jul 18, 1:33am Pulse 70bpm Jul 18, 1:33am   0319 Current Health Alert Tachypnea: Called Patient - Patient Stable No Action Needed. Patient reached denied S/Sx of tachypnea or need for emergent care.   0322 per Current Health Resp rate 16 rpm   SPO2   98%   Pulse  87 bpm   Movement  Considerable

## 2023-10-08 NOTE — Progress Notes (Signed)
 0930 virtual morning meeting with provider, patient and son. Today may be last IV antibiotics and pending labs she may discharged today.IMM letter confirmed with medic. Provider reviewed resuming regular coumadin  schedule after discharge. All scheduled medications given with medic at location.

## 2023-10-08 NOTE — TOC CM/SW Note (Signed)
 RNCM obtained hospital follow-up appointment with PCP on October 22, 2023 @ 0930.

## 2023-10-08 NOTE — Discharge Summary (Signed)
 [facility] Phys[facility] Discharge Summary   Patient name: [name]  Admit date:     10/05/2023  Discharge date: 10/08/2023  Attending Physician: SHONA TERRY SAILOR [[id number]]  Discharge Physician: Camellia Door   PCP: Randeen Laine LABOR, MD  Admitted From: Home  Disposition:  Home  Recommendations for Outpatient Follow-up:  Follow up with PCP in 1-2 weeks  Home Health:No Equipment/Devices: None    Discharge Condition:Stable CODE STATUS:FULL Diet recommendation: Heart Healthy Fluid Restriction: None  Hospital Summary: HPI: [name] is a 89 y.o. female with medical history significant for persistent A-fib on Coumadin , hypothyroidism, who presents to the ER due to confusion today.  Denies having any abdominal pain or diarrhea.  No reported fevers or chills.   In the ER, UA positive for pyuria.  Chest x-ray with changes of mild CHF.  BNP mildly elevated in the setting of obesity.  Started on aztreonam  in the ER due to severe penicillin allergy.  Also received a dose of IV Lasix  40 mg x 1.  Admitted by [facility], hospitalist service.   ED Course: Temperature 98.6.  BP 122/60, pulse 71, respiratory rate 12, O2 saturation 100% on room air.  Significant Events: Admitted 10/05/2023 for acute metabolic encephalopathy due to UTI   Admission Labs: UA hazy, nitrite negative, LE moderate, WBC 21-50 Na 140, K 3.9, CO2 of 25, BUN 23, Scr 1.04, glu 108 WBC 4.5, HgB 13.2, Plt 155 Covid/flu/RSV negative  Admission Imaging Studies: CXR show Changes of mild CHF.  CT head No acute intracranial abnormality. 2. 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 3. Acute on chronic right maxillary sinus disease has developed since 2021.  Significant Labs: BNP 155 INR 1.7  Significant Imaging Studies:   Antibiotic Therapy: Anti-infectives (From admission, onward)    Start     Dose/Rate Route Frequency Ordered Stop   10/06/23 1000  aztreonam  (AZACTAM ) 2 g in sodium chloride  0.9 % 100 mL  IVPB  Status:  Discontinued        2 g 200 mL/hr over 30 Minutes Intravenous Every 12 hours 10/06/23 0641 10/06/23 0646   10/06/23 1000  cefTRIAXone  (ROCEPHIN ) 1 g in sodium chloride  0.9 % 100 mL IVPB        1 g 200 mL/hr over 30 Minutes Intravenous Every 24 hours 10/06/23 0646     10/06/23 0030  aztreonam  (AZACTAM ) 2 g in sodium chloride  0.9 % 100 mL IVPB        2 g 200 mL/hr over 30 Minutes Intravenous  Once 10/06/23 0023 10/06/23 0138       Procedures:   Consultants:    Hospital Course by Problem: * Acute metabolic encephalopathy-resolved as of 10/08/2023 10-07-2023 due to UTI. Now resolved.  Acute cystitis without hematuria 10-07-2023 pt admitted to [facility] program yesterday. Day #2 today. continue with IV rocephin  1 gram daily. Day #2. Urine cx pending. Pt states she has long standing anorexia. Will order some Ensure protein shakes to boost her nutrition while she is being treated for UTI.  10-08-2023 urine cx grew multiple species. Complete total of 3 days of IV rocephin . Today is Day #3. Stable for DC today.  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 Stable  10-08-2023 stable.  Long term (current) use of anticoagulants - on coumadin  for afib 10-07-2023 check INR today. Pharmacy following for coumadin  dosing.  10-08-2023 INR drawn today. Will resume her home dosing of coumadin  at discharge. 2.5 mg every Mon, Wed,  Fri; 5 mg all other days(T, Th, Sat, Sun)  (HFpEF) heart failure with preserved ejection fraction (HCC) 10-07-2023 acute on chronic. Was treated with one dose of IV lasix . Now appears to be euvolemic. Continue with aldactone , cozaar , lasix , bisoprolol . Check CMP, BNP today. Daily weights.  10-08-2023 resolved. Stable. Discharge Dry Weight 175 lbs.  Atrial fibrillation, chronic (HCC) 10-07-2023 continue with coumadin . INR to be checked today. On mexitil  and bisoprolol . Admission EKG shows paced rhythm consistently with her  known hx of PPM.  10-08-2023 stable. INR 1.4 yesterday. Given 6 mg coumadin . At discharge, she will resume her normal coumadin  dosing. 2.5 mg (5 mg x 0.5) every Mon, Wed, Fri; 5 mg (5 mg x 1) all other days   *update. INR 1.6 today. She can resume her 2.5 mg on M, W, F and 5 mg on T, Th, Sat, Sunday  Acquired hypothyroidism 10-07-2023 stable. On synthroid .  10-08-2023 stable. On synthroid .    Discharge Diagnoses:  Active Problems:   Acute cystitis without hematuria   Acquired hypothyroidism   Atrial fibrillation, chronic (HCC)   (HFpEF) heart failure with preserved ejection fraction (HCC)   Long term (current) use of anticoagulants - on coumadin  for afib   S/P placement of cardiac pacemaker MDT 07/28/19   Discharge Instructions  Discharge Instructions     (HEART FAILURE PATIENTS) Call MD:  Anytime you have any of the following symptoms: 1) 3 pound weight gain in 24 hours or 5 pounds in 1 week 2) shortness of breath, with or without a dry hacking cough 3) swelling in the hands, feet or stomach 4) if you have to sleep on extra pillows at night in order to breathe.   Complete by: As directed    Call MD for:  difficulty breathing, headache or visual disturbances   Complete by: As directed    Call MD for:  extreme fatigue   Complete by: As directed    Call MD for:  hives   Complete by: As directed    Call MD for:  persistant dizziness or light-headedness   Complete by: As directed    Call MD for:  persistant nausea and vomiting   Complete by: As directed    Call MD for:  redness, tenderness, or signs of infection (pain, swelling, redness, odor or green/yellow discharge around incision site)   Complete by: As directed    Call MD for:  severe uncontrolled pain   Complete by: As directed    Call MD for:  temperature >100.4   Complete by: As directed    Diet - low sodium heart healthy   Complete by: As directed    Discharge instructions   Complete by: As directed    1. Follow up  with your primary care provider in 1-2 weeks following discharge from hospital.   Increase activity slowly   Complete by: As directed       Allergies as of 10/08/2023       Reactions   Pacerone [amiodarone] Swelling   Penicillins Hives, Rash   Tolerates ceftriaxone  during July 2025 admission   Zetia  [ezetimibe ] Other (See Comments)   Unknown reaction   Lopressor  [metoprolol ] Other (See Comments)   Hair loss   Statins Rash        Medication List     TAKE these medications    acetaminophen  325 MG tablet Commonly known as: TYLENOL  Take 650 mg by mouth 2 (two) times daily as needed for moderate pain (pain score 4-6), fever  or headache.   albuterol  108 (90 Base) MCG/ACT inhaler Commonly known as: VENTOLIN  HFA Inhale 1-2 puffs into the lungs every 6 (six) hours as needed for wheezing or shortness of breath.   bisoprolol  5 MG tablet Commonly known as: ZEBETA  Take 1 tablet (5 mg total) by mouth daily.   famotidine  20 MG tablet Commonly known as: PEPCID  Take 1 tablet (20 mg total) by mouth 2 (two) times daily.   ferrous sulfate  325 (65 FE) MG EC tablet Take 1 tablet (325 mg total) by mouth daily with breakfast.   fluticasone  50 MCG/ACT nasal spray Commonly known as: FLONASE  Place 1 spray into both nostrils 2 (two) times daily as needed for allergies or rhinitis.   furosemide  40 MG tablet Commonly known as: LASIX  Take 1 tablet (40 mg total) by mouth daily.   Jardiance  10 MG Tabs tablet Generic drug: empagliflozin  TAKE 1 TABLET BY MOUTH DAILY BEFORE BREAKFAST. What changed: when to take this   levothyroxine  75 MCG tablet Commonly known as: SYNTHROID  TAKE 1 TABLET EVERY DAY BEFORE BREAKFAST   losartan  25 MG tablet Commonly known as: COZAAR  TAKE 1 TABLET (25 MG TOTAL) BY MOUTH DAILY.   mexiletine 200 MG capsule Commonly known as: MEXITIL  TAKE 1 CAPSULE BY MOUTH TWICE A DAY   spironolactone  25 MG tablet Commonly known as: ALDACTONE  Take 0.5 tablets (12.5 mg  total) by mouth daily.   UNABLE TO FIND Take 1 tablet by mouth daily. Unknown OTC allergy medication, red tablet (not pseudoephedrine).   warfarin 5 MG tablet Commonly known as: COUMADIN  Take as directed. If you are unsure how to take this medication, talk to your nurse or doctor. Original instructions: Take 0.5-1 tablets (2.5-5 mg total) by mouth See admin instructions. Take 1/2 tablet (2.5mg ) by mouth once daily on Monday, Wednesday, Friday and take 1 tablet (5mg ) once daily on all other days.        Follow-up Information     Tower, Laine LABOR, MD Follow up on 10/22/2023.   Specialties: Family Medicine, Radiology Why: Time 9:30, Please arrive 15 min early to complete paperwork, Hospital Follow-up Contact information: [address] [place] [address] [phone]                Allergies  Allergen Reactions   Pacerone [Amiodarone] Swelling   Penicillins Hives and Rash    Tolerates ceftriaxone  during July 2025 admission   Zetia  [Ezetimibe ] Other (See Comments)    Unknown reaction   Lopressor  [Metoprolol ] Other (See Comments)    Hair loss   Statins Rash    Discharge Exam: Vitals:   10/08/23 0321 10/08/23 0946  BP:  (!) 145/82  Pulse: 83 73  Resp: 19 15  Temp:  (!) 97.5 F (36.4 C)  SpO2: 99% 98%   Weight Information (since admission)     Date/Time Weight Weight in lbs BSA (Calculated - sq m) BMI (Calculated) Who   10/08/23 0946 79.5 kg 175.27 lbs -- 29.17 VH   10/07/23 0940 79.4 kg 175.05 lbs -- 29.13 RG   10/07/23 0500 79.4 kg 175.05 lbs -- 29.13 RG   10/06/23 1700 78.8 kg 173.72 lbs -- 28.91 RG   10/05/23 2204 79.4 kg 175 lbs 1.91 sq meters 29.12 TD       Examination: Note that physical examination performed by paramedic on-scene and documented by provider Video Exam performed by video enabled technology  Physical Exam Vitals and nursing note reviewed.  Constitutional:      General: She  is not in acute distress.    Appearance: She is not  toxic-appearing or diaphoretic.  HENT:     Head: Normocephalic and atraumatic.  Cardiovascular:     Rate and Rhythm: Normal rate and regular rhythm.  Pulmonary:     Effort: Pulmonary effort is normal.     Breath sounds: Normal breath sounds.  Abdominal:     General: Bowel sounds are normal. There is no distension.     Palpations: Abdomen is soft.  Skin:    General: Skin is warm and dry.     Capillary Refill: Capillary refill takes less than 2 seconds.     Comments: No wounds or ulcers  Neurological:     Mental Status: She is alert and oriented to person, place, and time.     The results of significant diagnostics from this hospitalization (including imaging, microbiology, ancillary and laboratory) are listed below for reference.    Microbiology: Recent Results (from the past 240 hours)  Urine Culture (for pregnant, neutropenic or urologic patients or patients with an indwelling urinary catheter)     Status: Abnormal   Collection Time: 10/05/23 10:08 PM   Specimen: Urine, Clean Catch  Result Value Ref Range Status   Specimen Description URINE, CLEAN CATCH  Final   Special Requests   Final    NONE Performed at [facility], [address], [place], [place] [address]    Culture MULTIPLE SPECIES PRESENT, SUGGEST RECOLLECTION (A)  Final   Report Status 10/07/2023 FINAL  Final  Resp panel by RT-PCR (RSV, Flu A&B, Covid) Anterior Nasal Swab     Status: None   Collection Time: 10/05/23 11:50 PM   Specimen: Anterior Nasal Swab  Result Value Ref Range Status   SARS Coronavirus 2 by RT PCR NEGATIVE NEGATIVE Final   Influenza A by PCR NEGATIVE NEGATIVE Final   Influenza B by PCR NEGATIVE NEGATIVE Final    Comment: (NOTE) The Xpert Xpress SARS-CoV-2/FLU/RSV plus assay is intended as an aid in the diagnosis of influenza from Nasopharyngeal swab specimens and should not be used as a sole basis for treatment. Nasal washings and aspirates are unacceptable for Xpert Xpress  SARS-CoV-2/FLU/RSV testing.  Fact Sheet for Patients: BloggerCourse.com  Fact Sheet for Healthcare Providers: SeriousBroker.it  This test is not yet approved or cleared by the [place]  FDA and has been authorized for detection and/or diagnosis of SARS-CoV-2 by FDA under an Emergency Use Authorization (EUA). This EUA will remain in effect (meaning this test can be used) for the duration of the COVID-19 declaration under Section 564(b)(1) of the Act, 21 U.S.C. section 360bbb-3(b)(1), unless the authorization is terminated or revoked.     Resp Syncytial Virus by PCR NEGATIVE NEGATIVE Final    Comment: (NOTE) Fact Sheet for Patients: BloggerCourse.com  Fact Sheet for Healthcare Providers: SeriousBroker.it  This test is not yet approved or cleared by the [place]  FDA and has been authorized for detection and/or diagnosis of SARS-CoV-2 by FDA under an Emergency Use Authorization (EUA). This EUA will remain in effect (meaning this test can be used) for the duration of the COVID-19 declaration under Section 564(b)(1) of the Act, 21 U.S.C. section 360bbb-3(b)(1), unless the authorization is terminated or revoked.  Performed at [facility], [address] [place], [place] [address]     Labs: BNP (last 3 results) Recent Labs    10/06/23 0021 10/07/23 1040  BNP 155.1* 114.0*   Basic Metabolic Panel: Recent Labs  Lab 10/05/23  2214 10/06/23 0730 10/07/23 1040  NA 140 142 142  K 3.9 4.2 4.0  CL 106 101 104  CO2 25 28 27   GLUCOSE 108* 91 94  BUN 23 20 20   CREATININE 1.04* 0.94 1.03*  CALCIUM  9.2 9.0 9.3  MG  --  2.2  --   PHOS  --  3.3  --    Liver Function Tests: Recent Labs  Lab 10/05/23 2214 10/07/23 1040  AST 22 19  ALT 13 13  ALKPHOS 38 36*  BILITOT 1.0 0.7  PROT 7.1 7.1  ALBUMIN  3.8 3.5   CBC: Recent Labs  Lab 10/05/23 2214  10/06/23 0730 10/07/23 1040  WBC 4.5 4.0 4.3  HGB 13.2 12.7 13.1  HCT 41.6 38.9 41.1  MCV 93.1 90.7 91.3  PLT 155 146* 148*   BNP: Recent Labs  Lab 10/06/23 0021 10/07/23 1040  BNP 155.1* 114.0*   CBG: Recent Labs  Lab 10/05/23 2212  GLUCAP 109*   Urinalysis    Component Value Date/Time   COLORURINE YELLOW 10/05/2023 2208   APPEARANCEUR HAZY (A) 10/05/2023 2208   APPEARANCEUR Hazy (A) 10/15/2022 1303   LABSPEC 1.024 10/05/2023 2208   LABSPEC 1.006 02/28/2013 1204   PHURINE 5.0 10/05/2023 2208   GLUCOSEU >=500 (A) 10/05/2023 2208   GLUCOSEU Negative 02/28/2013 1204   HGBUR MODERATE (A) 10/05/2023 2208   BILIRUBINUR NEGATIVE 10/05/2023 2208   BILIRUBINUR Negative 10/15/2022 1303   BILIRUBINUR Negative 02/28/2013 1204   KETONESUR NEGATIVE 10/05/2023 2208   PROTEINUR 30 (A) 10/05/2023 2208   UROBILINOGEN 0.2 07/09/2022 0848   UROBILINOGEN 0.2 02/08/2012 0324   NITRITE NEGATIVE 10/05/2023 2208   LEUKOCYTESUR MODERATE (A) 10/05/2023 2208   LEUKOCYTESUR 3+ 02/28/2013 1204   Sepsis Labs Recent Labs  Lab 10/05/23 2214 10/06/23 0730 10/07/23 1040  WBC 4.5 4.0 4.3    Procedures/Studies: ECHOCARDIOGRAM COMPLETE Result Date: 10/06/2023    ECHOCARDIOGRAM REPORT   Patient Name:   [name] Date of Exam: 10/06/2023 Medical Rec #:  [id number]     Height:       65.0 in Accession #:    [id number]    Weight:       175.0 lb Date of Birth:  Nov 01, 1932      BSA:          1.869 m Patient Age:    89 years      BP:           124/63 mmHg Patient Gender: F             HR:           76 bpm. Exam Location:  Inpatient Procedure: 2D Echo, Cardiac Doppler and Color Doppler (Both Spectral and Color            Flow Doppler were utilized during procedure). Indications:    CHF  History:        Patient has prior history of Echocardiogram examinations.  Sonographer:    Vella Key Referring Phys: SHONA LAURENCE, N IMPRESSIONS  1. Left ventricular ejection fraction, by estimation, is 55 to 60%. The left  ventricle has normal function. The left ventricle demonstrates global hypokinesis. There is moderate concentric left ventricular hypertrophy. Left ventricular diastolic function could not be evaluated.  2. Right ventricular systolic function is normal. The right ventricular size is normal.  3. Left atrial size was severely dilated.  4. Device lead seen. Right atrial size was severely dilated.  5. The mitral valve is normal in  structure. Mild to moderate mitral valve regurgitation. Mild to moderate mitral stenosis. Moderate to severe mitral annular calcification.  6. Tricuspid valve regurgitation is moderate.  7. The aortic valve is calcified. There is moderate calcification of the aortic valve. There is moderate thickening of the aortic valve. Aortic valve regurgitation is trivial. Aortic valve sclerosis/calcification is present, without any evidence of aortic stenosis.  8. The inferior vena cava is normal in size with greater than 50% respiratory variability, suggesting right atrial pressure of 3 mmHg. FINDINGS  Left Ventricle: Left ventricular ejection fraction, by estimation, is 55 to 60%. The left ventricle has normal function. The left ventricle demonstrates global hypokinesis. The left ventricular internal cavity size was normal in size. There is moderate concentric left ventricular hypertrophy. Left ventricular diastolic function could not be evaluated due to mitral annular calcification (moderate or greater). Left ventricular diastolic function could not be evaluated. Right Ventricle: The right ventricular size is normal. No increase in right ventricular wall thickness. Right ventricular systolic function is normal. Left Atrium: Left atrial size was severely dilated. Right Atrium: Device lead seen. Right atrial size was severely dilated. Pericardium: There is no evidence of pericardial effusion. Presence of epicardial fat layer. Mitral Valve: The mitral valve is normal in structure. Moderate to severe mitral  annular calcification. Mild to moderate mitral valve regurgitation. Mild to moderate mitral valve stenosis. MV peak gradient, 12.2 mmHg. The mean mitral valve gradient is 6.0 mmHg. Tricuspid Valve: The tricuspid valve is normal in structure. Tricuspid valve regurgitation is moderate . No evidence of tricuspid stenosis. Aortic Valve: The aortic valve is calcified. There is moderate calcification of the aortic valve. There is moderate thickening of the aortic valve. There is moderate aortic valve annular calcification. Aortic valve regurgitation is trivial. Aortic valve sclerosis/calcification is present, without any evidence of aortic stenosis. Pulmonic Valve: The pulmonic valve was normal in structure. Pulmonic valve regurgitation is not visualized. No evidence of pulmonic stenosis. Aorta: The aortic root is normal in size and structure. Venous: The inferior vena cava is normal in size with greater than 50% respiratory variability, suggesting right atrial pressure of 3 mmHg. IAS/Shunts: No atrial level shunt detected by color flow Doppler. Additional Comments: A device lead is visualized.  LEFT VENTRICLE PLAX 2D LVIDd:         4.10 cm     Diastology LVIDs:         3.50 cm     LV e' medial:  7.62 cm/s LV PW:         1.20 cm     LV e' lateral: 14.00 cm/s LV IVS:        1.20 cm LVOT diam:     1.70 cm LV SV:         39 LV SV Index:   21 LVOT Area:     2.27 cm  LV Volumes (MOD) LV vol d, MOD A2C: 51.7 ml LV vol d, MOD A4C: 67.4 ml LV vol s, MOD A2C: 18.3 ml LV vol s, MOD A4C: 30.2 ml LV SV MOD A2C:     33.4 ml LV SV MOD A4C:     67.4 ml LV SV MOD BP:      36.7 ml RIGHT VENTRICLE RV Basal diam:  3.60 cm RV S prime:     11.80 cm/s TAPSE (M-mode): 1.0 cm LEFT ATRIUM              Index         RIGHT ATRIUM  Index LA diam:        5.10 cm  2.73 cm/m    RA Area:     32.90 cm LA Vol (A2C):   195.0 ml 104.33 ml/m  RA Volume:   122.00 ml 65.28 ml/m LA Vol (A4C):   105.0 ml 56.18 ml/m LA Biplane Vol: 156.0 ml 83.47  ml/m  AORTIC VALVE LVOT Vmax:   82.00 cm/s LVOT Vmean:  59.200 cm/s LVOT VTI:    0.172 m  AORTA Ao Root diam: 3.20 cm Ao Asc diam:  3.40 cm MITRAL VALVE MV Area VTI:  0.98 cm    SHUNTS MV Peak grad: 12.2 mmHg   Systemic VTI:  0.17 m MV Mean grad: 6.0 mmHg    Systemic Diam: 1.70 cm MV Vmax:      1.75 m/s MV Vmean:     120.0 cm/s Kardie Tobb DO Electronically signed by Dub Huntsman DO Signature Date/Time: 10/06/2023/2:15:23 PM    Final    CT HEAD WO CONTRAST ( ) Result Date: 10/06/2023 CLINICAL DATA:  89 year old female with unexplained altered mental status. EXAM: CT HEAD WITHOUT CONTRAST TECHNIQUE: Contiguous axial images were obtained from the base of the skull through the vertex without intravenous contrast. RADIATION DOSE REDUCTION: This exam was performed according to the departmental dose-optimization program which includes automated exposure control, adjustment of the mA and/or kV according to patient size and/or use of iterative reconstruction technique. COMPARISON:  Brain MRI 06/16/2019.  Head CT 03/02/2021. FINDINGS: Brain: Chronic posterior Right MCA territory infarct with encephalomalacia is stable on series 3, image 25. And a smaller area of chronic left occipital pole cortical encephalomalacia is stable on image 19. Background brain volume is normal for age. Elsewhere gray-white differentiation is normal for age. No midline shift, ventriculomegaly, mass effect, evidence of mass lesion, intracranial hemorrhage or evidence of cortically based acute infarction. Vascular: Calcified atherosclerosis at the skull base. No suspicious intracranial vascular hyperdensity. Skull: Intact. No acute osseous abnormality identified. Chronic left TMJ degeneration. Sinuses/Orbits: Right maxillary sinus mucoperiosteal thickening has developed since 2021. Other Visualized paranasal sinuses and mastoids are stable and well aerated. Other: No acute orbit or scalp soft tissue finding. IMPRESSION: 1. No acute intracranial  abnormality. 2. 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 3. Acute on chronic right maxillary sinus disease has developed since 2021. Electronically Signed   By: VEAR Hurst M.D.   On: 10/06/2023 10:34   DG Chest Portable 1 View Result Date: 10/06/2023 CLINICAL DATA:  Shortness of breath EXAM: PORTABLE CHEST 1 VIEW COMPARISON:  09/02/2023 FINDINGS: Cardiac shadow is enlarged. Pacing device is again seen. Postsurgical changes are noted. Increased central vascular congestion is noted with interstitial edema. No focal infiltrate is noted. No bony abnormality is seen. IMPRESSION: Changes of mild CHF. Electronically Signed   By: Oneil Devonshire M.D.   On: 10/06/2023 00:15    Time coordinating discharge: 55 mins  SIGNED:  Camellia Door, DO [facility] 10/08/23, 2:46 PM

## 2023-10-08 NOTE — Progress Notes (Signed)
 0800 Patient is alert and oriented and accompanied by son. Self administered meds taken after patient ID completed. Reports waking only once last night she changed position and was able to fall back to sleep without issue, Son reports patient ate well yesterday having multiple meals including pizza, eggs and a biscuit and hydrating with water. Her son feels she is still confused at times although much improved. Patient and son informed INR will be taken this morning if labs look good patient may discharge later today. All questions answered.

## 2023-10-11 ENCOUNTER — Telehealth: Payer: Self-pay

## 2023-10-11 ENCOUNTER — Telehealth: Payer: Self-pay | Admitting: Family Medicine

## 2023-10-11 LAB — CULTURE, BLOOD (ROUTINE X 2)
Culture: NO GROWTH
Special Requests: ADEQUATE

## 2023-10-11 NOTE — Patient Instructions (Signed)
 Visit Information  Thank you for taking time to visit with me today. Please don't hesitate to contact me if I can be of assistance to you before our next scheduled telephone appointment.  Our next appointment is by telephone on 10/19/23 at 11 am  Following is a copy of your care plan:   Goals Addressed             This Visit's Progress    VBCI Transitions of Care (TOC) Care Plan       Problems:  Recent Hospitalization for treatment of urinary tract infection Knowledge Deficit Related to urinary tract infection management and concerns related to memory  Goal:  Over the next 30 days, the patient will not experience hospital readmission  Interventions:  Transitions of Care: Doctor Visits  - discussed the importance of doctor visits Reviewed upcoming provider appointments and confirmed with patient that she has them documented on her personal calendar. Attempted to review medications with patient - patient declined Contacted patient's son/ dpr Okey Paula and discussed patients reports symptoms.  Okey states patients other son Darina will go to the house and check on her this morning.  Reviewed signs/ symptoms of stroke and heart attack with patient advising that these symptoms warrant a 911 call.  Advised patient to call 911 if current symptoms worsen. Advised patient to notify her provider of symptoms related to numbness around eye and check.  Advised to discuss with provider concerns regarding her memory. Assessed for ongoing UTI symptoms. Advised to drink plenty of water, keep vaginal area clean and wipe front to back.  Advised to report any new or ongoing symptoms to her primary care provider.  Advised son to talk with patients primary care provider for concerns/questions he has regarding patients mental state. Advised to take medications as prescribed Assessed for caregiver support Advised to keep follow up appointments with provider Discussed and offered 30 Day TOC follow up  program. Patient verbally agreed to 30 [facility] program.   Patient Self Care Activities:  Attend all scheduled provider appointments Call pharmacy for medication refills 3-7 days in advance of running out of medications Call provider office for new concerns or questions  Take medications as prescribed   Call 911 for severe symptoms. Keep follow up appointments with your providers Drink plenty of fluids    Plan:  Telephone follow up appointment with care management team member scheduled for:  10/19/23 at 11 am The patient has been provided with contact information for the care management team and has been advised to call with any health related questions or concerns.         Patient verbalizes understanding of instructions and care plan provided today and agrees to view in MyChart. Active MyChart status and patient understanding of how to access instructions and care plan via MyChart confirmed with patient.     The patient has been provided with contact information for the care management team and has been advised to call with any health related questions or concerns.   Please call the care guide team at [phone] if you need to cancel or reschedule your appointment.   Please call the Suicide and Crisis Lifeline: 988 call 1-800-273-TALK (toll free, 24 hour hotline) if you are experiencing a Mental Health or Behavioral Health Crisis or need someone to talk to.  Arvin Seip RN, BSN, CCM [employer], Population Health Case Manager Phone: [phone]

## 2023-10-11 NOTE — Telephone Encounter (Signed)
 Made aware :  patient update Received: Today Green, Davina E, RN  [place] Grayer, Laine LABOR, MD Good morning Dr. Randeen. I reached out to your patient today to do a TOC call.  She was discharged from the [facility] on 10/08/23 due to acute cystitis.   I spoke with your patient this morning.  She voiced concerns at our call regarding issues with feeling out of it, not feeling quite right.   She voiced having issues with her memory.  She states she feels she can't hold a thought and has to write everything down.  She also opted not to review her medications with me at the [facility] call.   She was advised to call 911 for serious/ severe symptoms or call her primary care provider office for ongoing symptoms/ concerns.  I contacted her son, Nusaybah Ivie and he was going to have a family member go check on patient.  Patient was alert and oriented x 3, and denied any other serious symptoms/ concerns.  Patient is scheduled to see you on 10/22/23 for a hospital follow up.  Please let me know if I can be of further assistance.  Davina Green RN, BSN, CCM [place]  [facility], Population Health Case Manager Phone: [phone]   I will see her as planned and watch for any other correspondence  Agree with ER precautions given and appreciate the input , also appreciate family checking in with patient

## 2023-10-11 NOTE — Transitions of Care (Post Inpatient/ED Visit) (Addendum)
 10/11/2023  Name: [name] MRN: [id number] DOB: 09/04/1932  Today's TOC FU Call Status: Today's TOC FU Call Status:: Successful TOC FU Call Completed TOC FU Call Complete Date: 10/11/23 Patient's Name and Date of Birth confirmed.  Transition Care Management Follow-up Telephone Call Date of Discharge: 10/08/23 Discharge Facility: [facility]) Type of Discharge: Inpatient Admission Primary Inpatient Discharge Diagnosis:: Acute Cystitis without hematuria How have you been since you were released from the hospital?: Worse (Patient states,  I feel out of it and I'm trying to get my barrings.  I feel really tired.) Any questions or concerns?: Yes Patient Questions/Concerns:: Patient states she woke up this morning feeling out of it and not quite right. She states she was able to take her medications and get her coffee and now she is just sitting trying to get her barrings.  Patient states she feels a little cloudy. She states she is able to move her arms/legs without issues. Articulate speech noted while talking with patient on the phone.  Patient oriented to person, place and time. Patient denies any chest pain or SOB.  Patient reports living alone however states her support system is her children that live in the area. Patient advised to call 911 if her symptoms worsen. Patient Questions/Concerns Addressed: Other: (patients concerns discussed.) Patients son/ dpr [name] contacted regarding patients reported symptoms of feeling, out of it, feeling not quite right and being very tired. Son states he has been taking care of patient for many years. He states patient reports these symptoms quite often and feels this is in relation to patients history of depression/ PTSD.  Son states he will have his brother [name] check on patient this morning.  Son states he feels patient is dealing with psychological issues.  He states patient has been totally independent up to this point.  He states  patient and family made a decision over the weekend to put her house on the market and find her residence that is closer to the family. Patient states she is not able to review her medications at this time because she can't remember all of the names.  Patient states she dispenses her medications and puts them in a pill box so that she can take her morning and evening medications.  Asked patient if she had a list of her medications available to review. Patient states she has medication bottles but does not want to review her medications at this time. Patient states she doesn't feels she has missed any of her medications or taken any medications incorrectly.  Items Reviewed: Did you receive and understand the discharge instructions provided?: Yes Medications obtained,verified, and reconciled?: Yes (Medications Reviewed) Any new allergies since your discharge?: No Dietary orders reviewed?: Yes Type of Diet Ordered:: Heart Healthy Do you have support at home?: Yes People in Home [RPT]: child(ren), adult Name of Support/Comfort Primary Source: [name]  Medications Reviewed Today: Patient declined medication review.  Medications Reviewed Today   Medications were not reviewed in this encounter     Home Care and Equipment/Supplies: Were Home Health Services Ordered?: No Any new equipment or medical supplies ordered?: No  Functional Questionnaire: Do you need assistance with bathing/showering or dressing?: No Do you need assistance with meal preparation?: No Do you need assistance with eating?: No Do you have difficulty maintaining continence: No Do you need assistance with getting out of bed/getting out of a chair/moving?: No Do you have difficulty managing or taking your medications?: No  Follow  up appointments reviewed: PCP Follow-up appointment confirmed?: Yes Date of PCP follow-up appointment?: 10/22/23 [facility] Follow-up appointment  confirmed?: Yes Date of Specialist follow-up appointment?: 10/14/23 Follow-Up Specialty Provider:: Dr. Lucie Hones Do you need transportation to your follow-up appointment?: No Do you understand care options if your condition(s) worsen?: Yes-patient verbalized understanding  SDOH Interventions Today    Flowsheet Row Most Recent Value  SDOH Interventions   Food Insecurity Interventions Intervention Not Indicated  Housing Interventions Intervention Not Indicated  Transportation Interventions Intervention Not Indicated  Utilities Interventions Intervention Not Indicated    Goals Addressed             This Visit's Progress    VBCI Transitions of Care (TOC) Care Plan       Problems:  Recent Hospitalization for treatment of urinary tract infection Knowledge Deficit Related to urinary tract infection management and concerns related to memory  Goal:  Over the next 30 days, the patient will not experience hospital readmission  Interventions:  Transitions of Care: Doctor Visits  - discussed the importance of doctor visits Reviewed upcoming provider appointments and confirmed with patient that she has them documented on her personal calendar. Attempted to review medications with patient - patient declined Contacted patient's son/ dpr [name] and discussed patients reports symptoms.  [name] states patients other son [name] will go to the house and check on her this morning.  Reviewed signs/ symptoms of stroke and heart attack with patient advising that these symptoms warrant a 911 call.  Advised patient to call 911 if current symptoms worsen. Advised patient to notify her provider of symptoms related to numbness around eye and check.  Advised to discuss with provider concerns regarding her memory. Assessed for ongoing UTI symptoms. Advised to drink plenty of water, keep vaginal area clean and wipe front to back.  Advised to report any new or ongoing symptoms to her primary care  provider.  Advised son to talk with patients primary care provider for concerns/questions he has regarding patients mental state. Advised to take medications as prescribed Assessed for caregiver support Advised to keep follow up appointments with provider Call primary care provider for new or ongoing symptoms Message sent to primary care provider regarding patients reported symptoms and concerns regarding memory Discussed and offered 30 Day TOC follow up program. Patient verbally agreed to 30 [facility] program.   Patient Self Care Activities:  Attend all scheduled provider appointments Call pharmacy for medication refills 3-7 days in advance of running out of medications Call provider office for new concerns or questions  Take medications as prescribed   Call 911 for severe symptoms. Keep follow up appointments with your providers Drink plenty of fluids, keep vaginal area clean and wipe front to back    Plan:  Telephone follow up appointment with care management team member scheduled for:  10/19/23 at 11 am The patient has been provided with contact information for the care management team and has been advised to call with any health related questions or concerns.

## 2023-10-12 ENCOUNTER — Telehealth: Payer: Self-pay | Admitting: Internal Medicine

## 2023-10-12 ENCOUNTER — Other Ambulatory Visit (HOSPITAL_COMMUNITY): Payer: Self-pay

## 2023-10-12 NOTE — Telephone Encounter (Signed)
 Pt is very fatigued. She feels like she is just going lay down and not wake up. Pt wants Dr Fernande to know how she is feeling.Please advise

## 2023-10-12 NOTE — Telephone Encounter (Signed)
 Called patient to ask some questions about what she is experiencing. She states she has just been very fatigued this morning and feels like if she fell asleep she would fall into a deep sleep and not wake up. Noted that patients medications had not changed since her hospital admission. She was asking what she should do. At time of call, she is alert and oriented x 4... she said she wanted to call her son and ask if he could pick her up and take her to his house so she could nap on his couch, since she thinks it is best to be around people at this time while she is resting just in case. I told her this would be a good idea and call us  back with any worsening symptoms like passing out or becoming disoriented. Patient verbalized understanding.

## 2023-10-14 ENCOUNTER — Ambulatory Visit (INDEPENDENT_AMBULATORY_CARE_PROVIDER_SITE_OTHER)

## 2023-10-14 ENCOUNTER — Ambulatory Visit: Payer: Self-pay | Admitting: Physician Assistant

## 2023-10-14 ENCOUNTER — Telehealth: Payer: Self-pay | Admitting: Family Medicine

## 2023-10-14 ENCOUNTER — Ambulatory Visit

## 2023-10-14 DIAGNOSIS — Z7901 Long term (current) use of anticoagulants: Secondary | ICD-10-CM | POA: Diagnosis not present

## 2023-10-14 DIAGNOSIS — F418 Other specified anxiety disorders: Secondary | ICD-10-CM

## 2023-10-14 DIAGNOSIS — R413 Other amnesia: Secondary | ICD-10-CM

## 2023-10-14 LAB — POCT INR: INR: 1.7 — AB (ref 2.0–3.0)

## 2023-10-14 NOTE — Progress Notes (Signed)
 Pt in hospital from 7/15 to 7/18 for UTI/altered mental status. Was placed on aztreonam  and rocephin  in the hospital. Pt denies any new medications at d/c. Pt is unsure if she is taking warfarin as directed. She reports she follows the calendar but she is not sure unless she looks at the calendar to see if she has taken it.  Increase dose today to take 1 1/2 tablets and then continue 1 tablet daily except take 1/2 tablet on Monday, Wednesday and Friday . Recheck in 1 weeks due to confusion/memory loss concerning taking warfarin.  Pt was very emotional today, crying at one point, about this time in my life is very concerning. Pt reports she is extremely fatigued all the time and also feels like she is in a fog all the time and cannot remember anything. Pt reports she stayed with her son and daughter-in-law when d/c from the hospital for a few days but she could not stay there because of all the activity in the house caused her anxiety. She reported she thinks her son and daughter-in-law, Leita, seem to think she is fine and as long as she is talking, walking and feeding herself nothing has to be done. She is considering assisted living but has not been able to research any. Several assisted living facility names were provided and written down for pt at this apt. Pt reports she is very isolated and does not have a lot of friends. Pt has been observed in the coumadin  clinic in the past several months as having some memory loss. An OV was made for pt with PCP when this was seen in the past.   Pt requested her son or daughter-in-law, Leita, be able to contact the coumadin  clinic to discuss her concerns and how emotional this all is for her. She gave verbal authorization to talk to both her son and her daughter-in-law. Provided phone number to coumadin  clinic if they would like to call. Pt was appreciative and verbalized understanding.   Advised pt to contact cardiology to make an OV to discuss chronic  fatigue. Reminded pt she has a hospital f/u apt with PCP next week. Pt verbalized understanding.  Pt's daughter-in-law, Gabryella Murfin, contacted coumadin  clinic nurse per pt's request when she left apt today.  She reports she thinks pt has some depression and is very isolated. Advised her of the discussion with the pt today and observations made in past visits. 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 She reports she is working with her husband, to look at facilities, likely assisted living facilities, for the pt. She realizes the pt may not be able to live alone for much longer. Advised of concern for memory loss and amount of isolation pt has. Advised pt today that she should have an apt with cardiology to discuss chronic fatigue. Twyla Leita number to cardiology so she could make apt for pt, due to pt may forget to call.  Advised this information will be forwarded to PCP. Advised if anything further is needed to feel free to contact the coumadin  clinic. Leita was appreciative and verbalized understanding.

## 2023-10-14 NOTE — Patient Instructions (Addendum)
 Pre visit review using our clinic review tool, if applicable. No additional management support is needed unless otherwise documented below in the visit note.  Increase dose today to take 1 1/2 tablets and then continue 1 tablet daily except take 1/2 tablet on Monday, Wednesday and Friday . Recheck in 1 weeks.

## 2023-10-14 NOTE — Telephone Encounter (Signed)
 I reviewed [name] documentation re: difficulty pt has had lately with mood/memory /isolation (appreciate this greatly)  She has a follow up with me (thanks, please keep that) Would she be ok if I did a referral to community care in the mean time -so a nurse or social worker can reach out to her and see if they can help further ?   Let me know and I can work on that  I will also see her as planned

## 2023-10-18 ENCOUNTER — Encounter: Payer: Self-pay | Admitting: Physician Assistant

## 2023-10-18 DIAGNOSIS — R413 Other amnesia: Secondary | ICD-10-CM | POA: Insufficient documentation

## 2023-10-18 NOTE — Telephone Encounter (Signed)
 Tried to contact pt on cell number but no answer and VM is full. Tried to contact pt on home number and no answer and no VM.

## 2023-10-18 NOTE — Telephone Encounter (Signed)
 Community care referral done

## 2023-10-18 NOTE — Telephone Encounter (Signed)
 Pt returned call. She reports she has had severe leg cramps, back of both legs the other day and has made an acute visit with PCP to discuss this.   Advised pt of available community resources and Teacher, adult education, which could help her though this time. Pt reports she is ok with a referral. Pt currently has an apt with Davina, RN, population health nurse tomorrow. Pt is receptive to available resources.   Pt reports she is sad (denies any suicidal thoughts), anxious, and thinks these things could be contributing to her memory loss. She also is aware she is isolated. Advised pt leg cramps can be caused from an electrolyte imbalance and those same electrolytes, if low, could cause confusion, disorientation and other neurological symptoms.   Pt is looking forward to getting the house and all of my belongings in order, so she can move into an assisted living facility. She does believe this will help and something she may need in the near future.   Went over apt dates and times for this week. Pt  wrote these on the calendar. She was aware of some apts and not others.  Advised pt a msg will be sent to PCP to let her know she is ok with a referral. Advised if things get overwhelming to feel free to contact the office or the coumadin  clinic. Pt verbalized understanding and was appreciative of the call.

## 2023-10-19 ENCOUNTER — Other Ambulatory Visit: Payer: Self-pay

## 2023-10-19 ENCOUNTER — Encounter: Payer: Self-pay | Admitting: Family Medicine

## 2023-10-19 ENCOUNTER — Ambulatory Visit: Payer: Self-pay | Admitting: Family Medicine

## 2023-10-19 ENCOUNTER — Ambulatory Visit (INDEPENDENT_AMBULATORY_CARE_PROVIDER_SITE_OTHER): Admitting: Family Medicine

## 2023-10-19 VITALS — BP 122/74 | HR 68 | Temp 97.7°F | Ht 65.0 in | Wt 172.0 lb

## 2023-10-19 DIAGNOSIS — R413 Other amnesia: Secondary | ICD-10-CM | POA: Diagnosis not present

## 2023-10-19 DIAGNOSIS — E039 Hypothyroidism, unspecified: Secondary | ICD-10-CM | POA: Diagnosis not present

## 2023-10-19 DIAGNOSIS — R200 Anesthesia of skin: Secondary | ICD-10-CM

## 2023-10-19 DIAGNOSIS — D500 Iron deficiency anemia secondary to blood loss (chronic): Secondary | ICD-10-CM

## 2023-10-19 DIAGNOSIS — F418 Other specified anxiety disorders: Secondary | ICD-10-CM | POA: Diagnosis not present

## 2023-10-19 DIAGNOSIS — R944 Abnormal results of kidney function studies: Secondary | ICD-10-CM | POA: Insufficient documentation

## 2023-10-19 DIAGNOSIS — R252 Cramp and spasm: Secondary | ICD-10-CM | POA: Diagnosis not present

## 2023-10-19 DIAGNOSIS — R5383 Other fatigue: Secondary | ICD-10-CM

## 2023-10-19 LAB — COMPREHENSIVE METABOLIC PANEL WITH GFR
ALT: 10 U/L (ref 0–35)
AST: 16 U/L (ref 0–37)
Albumin: 4.4 g/dL (ref 3.5–5.2)
Alkaline Phosphatase: 40 U/L (ref 39–117)
BUN: 23 mg/dL (ref 6–23)
CO2: 31 meq/L (ref 19–32)
Calcium: 9.2 mg/dL (ref 8.4–10.5)
Chloride: 100 meq/L (ref 96–112)
Creatinine, Ser: 0.9 mg/dL (ref 0.40–1.20)
GFR: 56.89 mL/min — ABNORMAL LOW (ref >60.00)
Glucose, Bld: 95 mg/dL (ref 70–99)
Potassium: 4 meq/L (ref 3.5–5.1)
Sodium: 140 meq/L (ref 135–145)
Total Bilirubin: 0.5 mg/dL (ref 0.2–1.2)
Total Protein: 7.1 g/dL (ref 6.0–8.3)

## 2023-10-19 LAB — CBC WITH DIFFERENTIAL/PLATELET
Basophils Absolute: 0 K/uL (ref 0.0–0.1)
Basophils Relative: 1 % (ref 0.0–3.0)
Eosinophils Absolute: 0.1 K/uL (ref 0.0–0.7)
Eosinophils Relative: 2.4 % (ref 0.0–5.0)
HCT: 38.9 % (ref 36.0–46.0)
Hemoglobin: 12.9 g/dL (ref 12.0–15.0)
Lymphocytes Relative: 24.4 % (ref 12.0–46.0)
Lymphs Abs: 1.1 K/uL (ref 0.7–4.0)
MCHC: 33.1 g/dL (ref 30.0–36.0)
MCV: 87.6 fl (ref 78.0–100.0)
Monocytes Absolute: 0.4 K/uL (ref 0.1–1.0)
Monocytes Relative: 9.6 % (ref 3.0–12.0)
Neutro Abs: 2.9 K/uL (ref 1.4–7.7)
Neutrophils Relative %: 62.6 % (ref 43.0–77.0)
Platelets: 190 K/uL (ref 150.0–400.0)
RBC: 4.45 Mil/uL (ref 3.87–5.11)
RDW: 15.4 % (ref 11.5–15.5)
WBC: 4.7 K/uL (ref 4.0–10.5)

## 2023-10-19 LAB — IRON: Iron: 52 ug/dL (ref 42–145)

## 2023-10-19 LAB — SEDIMENTATION RATE: Sed Rate: 58 mm/h — ABNORMAL HIGH (ref 0–30)

## 2023-10-19 LAB — VITAMIN B12: Vitamin B-12: 254 pg/mL (ref 211–911)

## 2023-10-19 LAB — FOLATE: Folate: 8.7 ng/mL (ref >5.9)

## 2023-10-19 LAB — MAGNESIUM: Magnesium: 2.4 mg/dL (ref 1.5–2.5)

## 2023-10-19 LAB — FERRITIN: Ferritin: 147.2 ng/mL (ref 10.0–291.0)

## 2023-10-19 LAB — TSH: TSH: 4.14 u[IU]/mL (ref 0.35–5.50)

## 2023-10-19 LAB — CK: Total CK: 55 U/L (ref 17–177)

## 2023-10-19 NOTE — Progress Notes (Signed)
 Subjective:    Patient ID: Ronal JULIANNA Paula, female    DOB: 04-24-1932, 89 y.o.   MRN: [id number]  HPI  Wt Readings from Last 3 Encounters:  10/19/23 172 lb (78 kg)  10/08/23 175 lb 4.3 oz (79.5 kg)  09/02/23 175 lb 4 oz (79.5 kg)   28.62 kg/m  Vitals:   10/19/23 1216  BP: 122/74  Pulse: 68  Temp: 97.7 F (36.5 C)  SpO2: 98%   Pt presents for c/o  Cramps in legs     In both legs; happens a lot when she's getting out of bed. Does still hurt some when she's up and walking. Patient states she is drinking water a lot and her kids told her she needs electrolytes; so she's been drinking Gatorades. Patient states the cramp/pain run up her hip/butt as well.    Cramping started when she walked across the room  Both legs  Severe pain - from buttocks all the way down to feet  ? If was drinking too much free water   Thinks that the electrolyte drink (gatorade) helped      Drinking water and electrolytes  Helps a bit   Lasix  40 Aldactone  25   Has a fib On warfarin Lab Results  Component Value Date   INR 1.7 (A) 10/14/2023   INR 1.6 (H) 10/08/2023   INR 1.4 (H) 10/07/2023    Varicose veins Hypothyroid     Lab Results  Component Value Date   NA 142 10/07/2023   K 4.0 10/07/2023   CO2 27 10/07/2023   GLUCOSE 94 10/07/2023   BUN 20 10/07/2023   CREATININE 1.03 (H) 10/07/2023   CALCIUM  9.3 10/07/2023   GFR 54.22 (L) 03/25/2023   EGFR 54 (L) 01/26/2023   GFRNONAA 52 (L) 10/07/2023   Lab Results  Component Value Date   ALT 13 10/07/2023   AST 19 10/07/2023   ALKPHOS 36 (L) 10/07/2023   BILITOT 0.7 10/07/2023   Magnesium 2.2 on 7/16  Lab Results  Component Value Date   WBC 4.3 10/07/2023   HGB 13.1 10/07/2023   HCT 41.1 10/07/2023   MCV 91.3 10/07/2023   PLT 148 (L) 10/07/2023   Lab Results  Component Value Date   IRON  67 03/25/2023   TIBC 350 12/28/2018   FERRITIN 107.2 07/03/2022    Lab Results  Component Value Date   TSH 3.69 03/25/2023    Lab Results  Component Value Date   HGBA1C 6.0 03/25/2023   HGBA1C 5.8 07/03/2022   HGBA1C 6.0 03/12/2022    Was recently hospitalized for UTI with acute encephalopathy   Culture came back contaminated  Was treatment with aztreonam , rocephin   Had some mild CHF signs on cxr  CT head chronic r MCA and PCA territory infarcts Some chronic right maxillary sinus disease    She is now considering moving from her home to [facility] living where more people are around her  Looking in the [place] area  Starting the process to downsize   Memory  Cannot play cards Is an avid reader -not retaining like she used to  Being very methodical  Stays quite sad  Weepy at times- fights that   Feels numb in feet at times    Patient Active Problem List   Diagnosis Date Noted   Muscle cramps 10/19/2023   Decreased GFR 10/19/2023   Memory change 10/18/2023   Acute cystitis without hematuria 10/06/2023   Nonrheumatic mitral valve regurgitation 09/21/2022   Other fatigue  10/08/2021   Salt craving 06/25/2021   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/20/2020   Hearing loss 06/13/2020   S/P placement of cardiac pacemaker MDT 07/28/19 07/29/2019   History of CVA (cerebrovascular accident) 06/16/2019   Facial tingling 11/29/2018   Tremor of left hand 11/29/2018   Iron  deficiency anemia 10/21/2017   Constipation 08/02/2017   Numbness in feet 07/14/2017   Hip osteoarthritis 04/27/2017   Long term (current) use of anticoagulants - on coumadin  for afib 03/04/2017   Venous stasis dermatitis of both lower extremities 01/08/2017   Osteopenia 10/25/2016   Pedal edema 08/26/2016   Varicose veins of both lower extremities 08/26/2016   Estrogen deficiency 08/26/2016   Hemorrhoids 08/26/2016   History of nonmelanoma skin cancer 01/01/2016   Hip pain 08/02/2014   Left knee pain 08/02/2014   Chronic cough 05/08/2014   Fatigue 08/16/2013   Left ovarian cyst 03/14/2013   (HFpEF) heart failure with preserved  ejection fraction (HCC) 12/27/2012   PULMONARY NODULE 12/20/2008   Ganglion 10/04/2007   Mixed incontinence 04/28/2007   Hyperlipidemia 04/27/2007   Depression with anxiety 04/27/2007   Asthma, mild intermittent 04/27/2007   Insomnia 04/27/2007   Acquired hypothyroidism 09/02/2006   Atrial fibrillation, chronic (HCC) 08/05/2006   Past Medical History:  Diagnosis Date   ADENOMATOUS COLONIC POLYP 11/04/2006   Qualifier: Diagnosis of   By: Earlean CMA (AAMA), Amanda         Allergic rhinitis    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 s/p PVI [name] 2010;  b. on tikosyn /coumadin ;  c. 05/2009 Echo: EF 60-65%, Gr 2 DD. (first dx 09/ 2007)   AV block 07/28/2019   Bilateral lower extremity edema    Bleeding hemorrhoid    Carotid stenosis    mild (hosp 3/11)- consult by vasc/ Dr Oris   Complication of anesthesia    hard to wake   Diverticulosis of colon    Dyspnea    on exertion-climbing stairs   Fatty liver    H/O cardiac radiofrequency ablation    01/ 2008 at [facility] [place]  /  03/ 2010  at [facility]   Heart failure with preserved ejection fraction (HCC)    History of adenomatous polyp of colon    tubular adenoma's   History of cardiomyopathy    secondary tachycardia-induced cardiomyopathy -- resolved 2014   History of colonic polyps 09/18/2009   Qualifier: Diagnosis of   By: Genie CMA LEODIS), Chick      IMO SNOMED Dx Update Oct 2024   Formatting of this note might be different from the original. Qualifier: Diagnosis of By: Kowalk CMA LEODIS), Chick     History of squamous cell carcinoma in situ (SCCIS) of skin    05/ 2017  nasal bridge and right medial knee   History of transient ischemic attack (TIA)    01-24-2005 and 06-12-2009   Hyperlipidemia    Hypothyroidism    Mild intermittent asthma    reacts to cats   Mixed stress and urge urinary incontinence     Presence of heart assist device (HCC) 03/25/2023   Presence of permanent cardiac pacemaker    was put in 07/2019   Pulmonary nodule    S/P AV nodal ablation 07/28/19 07/29/2019   S/P mitral valve repair 10-23-1998  dr lansing at [facility]   for MVP and regurg. (  annuloplasty ring procedure)   S/P placement of cardiac pacemaker MDT 07/28/19 07/29/2019   Status post total replacement of left hip 05/04/2017   Past Surgical History:  Procedure Laterality Date   APPENDECTOMY  1978   AV NODE ABLATION N/A 07/28/2019   Procedure: AV NODE ABLATION;  Surgeon: Fernande Elspeth BROCKS, MD;  Location: [facility] INVASIVE CV LAB;  Service: Cardiovascular;  Laterality: N/A;   BUBBLE STUDY  06/19/2019   Procedure: BUBBLE STUDY;  Surgeon: Mona Vinie BROCKS, MD;  Location: [facility] ENDOSCOPY;  Service: Cardiovascular;;   CARDIAC ELECTROPHYSIOLOGY MAPPING AND ABLATION  01/ 2008    at [facility] [place]    right-sided ablation atrial flutter   CARDIAC ELECTROPHYSIOLOGY STUDY AND ABLATION  03/ 2010   dr marland at [facility]   AV node ablation and pulmonary vein isolation for atrial fib   CARDIOVERSION  06-18-2006;  07-13-2006;  10-19-2010;  10-27-2010   CATARACT EXTRACTION W/PHACO Right 04/09/2021   Procedure: CATARACT EXTRACTION PHACO AND INTRAOCULAR LENS PLACEMENT (IOC) RIGHT 6.71 01:11.1;  Surgeon: Mittie Gaskin, MD;  Location: [facility] SURGERY CNTR;  Service: Ophthalmology;  Laterality: Right;   CATARACT EXTRACTION W/PHACO Left 04/23/2021   Procedure: CATARACT EXTRACTION PHACO AND INTRAOCULAR LENS PLACEMENT (IOC) LEFT;  Surgeon: Mittie Gaskin, MD;  Location: [facility] SURGERY CNTR;  Service: Ophthalmology;  Laterality: Left;  [place] 5.65 00:50.1   COLONOSCOPY     COLONOSCOPY WITH PROPOFOL  N/A 10/13/2017   Procedure: COLONOSCOPY WITH PROPOFOL ;  Surgeon: Therisa Bi, MD;  Location: [facility] ENDOSCOPY;  Service: Gastroenterology;  Laterality: N/A;   COLONOSCOPY WITH PROPOFOL  N/A 02/20/2020   Procedure: COLONOSCOPY WITH  PROPOFOL ;  Surgeon: Maryruth Ole DASEN, MD;  Location: [facility] ENDOSCOPY;  Service: Endoscopy;  Laterality: N/A;   CYSTO/ TRANSURETHRAL COLLAGEN INJECTION THERAPY  07-26-2007   dr gaston   DILATION AND CURETTAGE OF UTERUS     ESOPHAGOGASTRODUODENOSCOPY (EGD) WITH PROPOFOL  N/A 10/13/2017   Procedure: ESOPHAGOGASTRODUODENOSCOPY (EGD) WITH PROPOFOL ;  Surgeon: Therisa Bi, MD;  Location: [facility] ENDOSCOPY;  Service: Gastroenterology;  Laterality: N/A;   EVALUATION UNDER ANESTHESIA WITH HEMORRHOIDECTOMY N/A 04/09/2020   Procedure: EXAM UNDER ANESTHESIA WITH HEMORRHOIDECTOMY;  Surgeon: Jordis Laneta FALCON, MD;  Location: [facility] ORS;  Service: General;  Laterality: N/A;   EXCISIONAL HEMORRHOIDECTOMY  1980s   GIVENS CAPSULE STUDY N/A 12/08/2017   Procedure: GIVENS CAPSULE STUDY;  Surgeon: Therisa Bi, MD;  Location: [facility] ENDOSCOPY;  Service: Gastroenterology;  Laterality: N/A;   HEMORRHOID SURGERY N/A 10/29/2016   Procedure: HEMORRHOIDECTOMY;  Surgeon: Debby Hila, MD;  Location: [facility];  Service: General;  Laterality: N/A;   MITRAL VALVE ANNULOPLASTY  10/23/1998   Model [id number]; [place] [id number]; size 32mm; [facility]; Dr. Lansing   PACEMAKER IMPLANT N/A 07/28/2019   Procedure: PACEMAKER IMPLANT;  Surgeon: Fernande Elspeth BROCKS, MD;  Location: [facility] INVASIVE CV LAB;  Service: Cardiovascular;  Laterality: N/A;   PILONIDAL CYST EXCISION  1954   RIGHT/LEFT HEART CATH AND CORONARY ANGIOGRAPHY Bilateral 09/21/2022   Procedure: RIGHT/LEFT HEART CATH AND CORONARY ANGIOGRAPHY;  Surgeon: Darron Deatrice LABOR, MD;  Location: [facility] INVASIVE CV LAB;  Service: Cardiovascular;  Laterality: Bilateral;   TEE WITH CARDIOVERSION  05-06-2006 at [facility];  01-02-2013 at [facility]   TEE WITHOUT CARDIOVERSION N/A 06/19/2019   Procedure: TRANSESOPHAGEAL ECHOCARDIOGRAM (TEE);  Surgeon: Mona Vinie BROCKS, MD;  Location: [facility] ENDOSCOPY;  Service: Cardiovascular;  Laterality: N/A;   TOTAL HIP ARTHROPLASTY Left 05/04/2017   Procedure: LEFT TOTAL  HIP ARTHROPLASTY ANTERIOR APPROACH;  Surgeon: Vernetta Lonni GRADE, MD;  Location: [facility] OR;  Service: Orthopedics;  Laterality: Left;   TRANSTHORACIC ECHOCARDIOGRAM  05-01-2015   dr fernande   ef 50-55%/  mild AV sclerosis without stenosis/  post MV repair with mild central MR (valve area by pressure half-time 2cm^2,  valve area by continutity equation 0.91cm^2, peak grandiant 36mmHg)/  severe LAE/ mild TR/ mild RAE    TUBAL LIGATION Bilateral 1978   Social History   Tobacco Use   Smoking status: Never   Smokeless tobacco: Never  Vaping Use   Vaping status: Never Used  Substance Use Topics   Alcohol  use: Not Currently    Comment: seldom   Drug use: No   Family History  Problem Relation Age of Onset   Lung cancer Father        smoker, died at 53   Alcohol  abuse Father    Cancer Father        bladder and lung CA smoker   Sudden death Other    Breast cancer Neg Hx    Stroke Neg Hx    Allergies  Allergen Reactions   Pacerone [Amiodarone] Swelling   Penicillins Hives and Rash    Tolerates ceftriaxone  during July 2025 admission   Zetia  [Ezetimibe ] Other (See Comments)    Unknown reaction   Lopressor  [Metoprolol ] Other (See Comments)    Hair loss   Statins Rash   Current Outpatient Medications on File Prior to Visit  Medication Sig Dispense Refill   acetaminophen  (TYLENOL ) 325 MG tablet Take 650 mg by mouth 2 (two) times daily as needed for moderate pain (pain score 4-6), fever or headache.     albuterol  (VENTOLIN  HFA) 108 (90 Base) MCG/ACT inhaler Inhale 1-2 puffs into the lungs every 6 (six) hours as needed for wheezing or shortness of breath. 18 g 2   bisoprolol  (ZEBETA ) 5 MG tablet Take 1 tablet (5 mg total) by mouth daily. 30 tablet 11   famotidine  (PEPCID ) 20 MG tablet Take 1 tablet (20 mg total) by mouth 2 (two) times daily. 60 tablet 1   ferrous sulfate  325 (65 FE) MG EC tablet Take 1 tablet (325 mg total) by mouth daily with breakfast. 90 tablet 2   fluticasone  (FLONASE )  50 MCG/ACT nasal spray Place 1 spray into both nostrils 2 (two) times daily as needed for allergies or rhinitis. 16 g 11   furosemide  (LASIX ) 40 MG tablet Take 1 tablet (40 mg total) by mouth daily. 90 tablet 3   JARDIANCE  10 MG TABS tablet TAKE 1 TABLET BY MOUTH DAILY BEFORE BREAKFAST. 30 tablet 6   levothyroxine  (SYNTHROID ) 75 MCG tablet TAKE 1 TABLET EVERY DAY BEFORE BREAKFAST 90 tablet 1   losartan  (COZAAR ) 25 MG tablet TAKE 1 TABLET (25 MG TOTAL) BY MOUTH DAILY. 90 tablet 3   mexiletine (MEXITIL ) 200 MG capsule TAKE 1 CAPSULE BY MOUTH TWICE A DAY 180 capsule 1   spironolactone  (ALDACTONE ) 25 MG tablet Take 0.5 tablets (12.5 mg total) by mouth daily. 45 tablet 3   UNABLE TO FIND Take 1 tablet by mouth daily. Unknown OTC allergy medication, red tablet (not pseudoephedrine).     warfarin (COUMADIN ) 5 MG tablet Take 0.5-1 tablets (2.5-5 mg total) by mouth See admin instructions. Take 1/2 tablet (2.5mg ) by mouth once daily on Monday, Wednesday, Friday and take 1 tablet (5mg ) once daily on all other days.     No current facility-administered medications on file prior to visit.    Review of Systems  Constitutional:  Positive for fatigue. Negative for activity change,  appetite change, fever and unexpected weight change.  HENT:  Negative for congestion, ear pain, rhinorrhea, sinus pressure and sore throat.   Eyes:  Negative for pain, redness and visual disturbance.  Respiratory:  Negative for cough, shortness of breath and wheezing.   Cardiovascular:  Negative for chest pain and palpitations.  Gastrointestinal:  Negative for abdominal pain, blood in stool, constipation and diarrhea.  Endocrine: Negative for polydipsia and polyuria.  Genitourinary:  Negative for dysuria, flank pain, frequency, pelvic pain and urgency.  Musculoskeletal:  Positive for arthralgias. Negative for back pain and myalgias.  Skin:  Negative for pallor and rash.  Allergic/Immunologic: Negative for environmental allergies.   Neurological:  Negative for dizziness, syncope and headaches.  Hematological:  Negative for adenopathy. Does not bruise/bleed easily.  Psychiatric/Behavioral:  Positive for decreased concentration and dysphoric mood. The patient is nervous/anxious.        Objective:   Physical Exam Constitutional:      General: She is not in acute distress.    Appearance: Normal appearance. She is well-developed and normal weight. She is not ill-appearing or diaphoretic.  HENT:     Head: Normocephalic and atraumatic.  Eyes:     Conjunctiva/sclera: Conjunctivae normal.     Pupils: Pupils are equal, round, and reactive to light.  Neck:     Thyroid : No thyromegaly.     Vascular: No carotid bruit or JVD.  Cardiovascular:     Rate and Rhythm: Normal rate and regular rhythm.     Pulses: Normal pulses.     Heart sounds: Normal heart sounds.     No gallop.     Comments: Varicosities below knee bilat  Pulmonary:     Effort: Pulmonary effort is normal. No respiratory distress.     Breath sounds: Normal breath sounds. No wheezing or rales.  Abdominal:     General: There is no distension or abdominal bruit.     Palpations: Abdomen is soft.  Musculoskeletal:     Cervical back: Normal range of motion and neck supple.     Right lower leg: No edema.     Left lower leg: No edema.     Comments: Fairly good rom spine, hips and knees  Using cane   Lymphadenopathy:     Cervical: No cervical adenopathy.  Skin:    General: Skin is warm and dry.     Coloration: Skin is not pale.     Findings: No rash.  Neurological:     Mental Status: She is alert.     Cranial Nerves: No cranial nerve deficit.     Motor: No weakness.     Coordination: Coordination normal.     Deep Tendon Reflexes: Reflexes are normal and symmetric. Reflexes normal.  Psychiatric:        Attention and Perception: Attention normal.        Mood and Affect: Mood is depressed. Mood is not anxious.        Speech: Speech normal.         Behavior: Behavior normal.     Comments: Candidly discusses symptoms and stressors    Today fairly mentally sharp           Assessment & Plan:   Problem List Items Addressed This Visit       Endocrine   Acquired hypothyroidism   TSH today  More leg cramps and memory problems lately  Levothyroxine  75 mcg daily       Relevant Orders   TSH  Other   Other fatigue   May be multifactorial Recent hosp for uti   Lab today      Relevant Orders   Comprehensive metabolic panel with GFR   CBC with Differential/Platelet   Iron    Ferritin   TSH   Numbness in feet   Intermittent  Reassuring exam Labs today      Relevant Orders   Comprehensive metabolic panel with GFR   CBC with Differential/Platelet   Vitamin B12   Vitamin B6   Folate   Sedimentation Rate   Iron    Ferritin   Magnesium   TSH   Muscle cramps - Primary   From buttock down to feet  Severe several days ago  Now improved (is drinking gatorade) Reassuring exam Pedal pulses present Varicosities noted   Lab today      Relevant Orders   Comprehensive metabolic panel with GFR   CBC with Differential/Platelet   Vitamin B12   Vitamin B6   Folate   CK   Sedimentation Rate   Iron    Ferritin   Magnesium   TSH   Memory change   Short term memory  Confusion with uti  Reviewed hospital records, lab results and studies in detail   That is much better  Fairly sharp today  Would benefit from socialization  Com care ref done this week  Lab today  Follow up later this week to discuss further        Relevant Orders   Comprehensive metabolic panel with GFR   CBC with Differential/Platelet   Vitamin B6   Folate   Sedimentation Rate   TSH   Iron  deficiency anemia   More muscle cramps Lab today      Relevant Orders   CBC with Differential/Platelet   Iron    Ferritin   Depression with anxiety   Pt may be open to treatment  More sad  Follow up Friday as planned to discuss  further       Decreased GFR   Noted in hospital GFR 52 Not unusual for age Is on lasix  and aldactone  Lab today

## 2023-10-19 NOTE — Assessment & Plan Note (Signed)
 Short term memory  Confusion with uti  Reviewed hospital records, lab results and studies in detail   That is much better  Fairly sharp today  Would benefit from socialization  Com care ref done this week  Lab today  Follow up later this week to discuss further

## 2023-10-19 NOTE — Assessment & Plan Note (Signed)
 Intermittent  Reassuring exam Labs today

## 2023-10-19 NOTE — Assessment & Plan Note (Signed)
 Pt may be open to treatment  More sad  Follow up Friday as planned to discuss further

## 2023-10-19 NOTE — Patient Instructions (Addendum)
 Labs today for  Cramping/ leg pain  Foot numbness  Memory issues Fatigue   Follow up on Friday as planned so we can review results and also talk about memory and mood   If any symptoms worsen in the meantime let us  know

## 2023-10-19 NOTE — Patient Instructions (Signed)
 Visit Information  Thank you for taking time to visit with me today. Please don't hesitate to contact me if I can be of assistance to you before our next scheduled telephone appointment.  Our next appointment is by telephone on 10/27/23 at 11 am  Following is a copy of your care plan:   Goals Addressed             This Visit's Progress    VBCI Transitions of Care (TOC) Care Plan       Problems:  Recent Hospitalization for treatment of urinary tract infection Knowledge Deficit Related to urinary tract infection management and concerns related to memory  Goal:  Over the next 30 days, the patient will not experience hospital readmission  Interventions:  Transitions of Care: Doctor Visits  - discussed the importance of doctor visits- Patient scheduled for primary care provider follow up on 10/19/27.  Reviewed upcoming provider appointments and confirmed with patient that she has them documented on her personal calendar. Medications reviewed and compliance discussed.  Advised patient to notify her provider of symptoms related to numbness around eye and check.  Advised to discuss with provider concerns regarding her memory. Assessed for ongoing UTI symptoms. Advised to drink plenty of water, keep vaginal area clean and wipe front to back.  Advised to report any new or ongoing symptoms to her primary care provider.  Advised to take medications as prescribed Advised to keep follow up appointments with provider Call primary care provider for new or ongoing symptoms Advised patient to report buttock /leg pain to primary provider at scheduled appointment today Advised to consider using a heating pad  buttock/ leg area to help relax muscles.  Discussed fall precautions when using ambulatory device. Advised to use ambulatory device as recommended by provider.   Patient Self Care Activities:  Attend all scheduled provider appointments Call pharmacy for medication refills 3-7 days in advance of  running out of medications Call provider office for new concerns or questions  Take medications as prescribed   Call 911 for severe symptoms. Keep follow up appointments with your providers Drink plenty of fluids, keep vaginal area clean and wipe front to back Use ambulatory device as recommended by provider Report new symptoms of buttock/ leg pain to primary care provider.     Plan:  Telephone follow up appointment with care management team member scheduled for:  10/27/23 at 11 am The patient has been provided with contact information for the care management team and has been advised to call with any health related questions or concerns.         Patient verbalizes understanding of instructions and care plan provided today and agrees to view in MyChart. Active MyChart status and patient understanding of how to access instructions and care plan via MyChart confirmed with patient.     The patient has been provided with contact information for the care management team and has been advised to call with any health related questions or concerns.   Please call the care guide team at [phone] if you need to cancel or reschedule your appointment.   Please call the Suicide and Crisis Lifeline: 988 call 1-800-273-TALK (toll free, 24 hour hotline) if you are experiencing a Mental Health or Behavioral Health Crisis or need someone to talk to.  Arvin Seip RN, BSN, CCM [employer], Population Health Case Manager Phone: [phone]

## 2023-10-19 NOTE — Assessment & Plan Note (Signed)
 From buttock down to feet  Severe several days ago  Now improved (is drinking gatorade) Reassuring exam Pedal pulses present Varicosities noted   Lab today

## 2023-10-19 NOTE — Assessment & Plan Note (Signed)
 More muscle cramps Lab today

## 2023-10-19 NOTE — Assessment & Plan Note (Signed)
 May be multifactorial Recent hosp for uti   Lab today

## 2023-10-19 NOTE — Assessment & Plan Note (Signed)
 TSH today  More leg cramps and memory problems lately  Levothyroxine  75 mcg daily

## 2023-10-19 NOTE — Transitions of Care (Post Inpatient/ED Visit) (Signed)
 Transition of Care week 2  Visit Note  10/19/2023  Name: [name] MRN: [id number]          DOB: [birth year]  Situation: Patient enrolled in [facility] 30-day program. Visit completed with patient by telephone.   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 s/p PVI [name] 2010;  b. on tikosyn /coumadin ;  c. 05/2009 Echo: EF 60-65%, Gr 2 DD. (first dx 09/ 2007)   AV block 07/28/2019   Bilateral lower extremity edema    Bleeding hemorrhoid    Carotid stenosis    mild (hosp 3/11)- consult by vasc/ Dr Oris   Complication of anesthesia    hard to wake   Diverticulosis of colon    Dyspnea    on exertion-climbing stairs   Fatty liver    H/O cardiac radiofrequency ablation    01/ 2008 at [facility]  /  03/ 2010  at [facility]   Heart failure with preserved ejection fraction [facility])    History of adenomatous polyp of colon    tubular adenoma's   History of cardiomyopathy    secondary tachycardia-induced cardiomyopathy -- resolved 2014   History of colonic polyps 09/18/2009   Qualifier: Diagnosis of   By: Genie CMA LEODIS), Chick      IMO SNOMED Dx Update Oct 2024   Formatting of this note might be different from the original. Qualifier: Diagnosis of By: Kowalk CMA LEODIS), Chick     History of squamous cell carcinoma in situ (SCCIS) of skin    05/ 2017  nasal bridge and right medial knee   History of transient ischemic attack (TIA)    01-24-2005 and 06-12-2009   Hyperlipidemia    Hypothyroidism    Mild intermittent asthma    reacts to cats   Mixed stress and urge urinary incontinence    Presence of heart assist device (HCC)  03/25/2023   Presence of permanent cardiac pacemaker    was put in 07/2019   Pulmonary nodule    S/P AV nodal ablation 07/28/19 07/29/2019   S/P mitral valve repair 10-23-1998  dr lansing at [facility]   for MVP and regurg. 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    Assessment: Patient Reported Symptoms: Cognitive Cognitive Status: Normal speech and language skills, No symptoms reported, Alert and oriented to person, place, and time Cognitive/Intellectual Conditions Management [RPT]: None reported or documented in medical history or problem list      Neurological Neurological Review of Symptoms: No symptoms reported    HEENT HEENT Symptoms Reported: No symptoms reported      Cardiovascular Cardiovascular Symptoms Reported: No symptoms reported    Respiratory Respiratory Symptoms Reported: No symptoms reported    Endocrine Endocrine Symptoms Reported: No symptoms reported Is patient diabetic?: No    Gastrointestinal Gastrointestinal Symptoms Reported: No symptoms reported      Genitourinary Genitourinary Symptoms Reported: Incontinence Additional Genitourinary Details: Patient states she has some urinary leakage and has to wear  pads. Genitourinary Management Strategies: Incontinence garment/pad  Integumentary Integumentary Symptoms Reported: No symptoms reported    Musculoskeletal Musculoskelatal Symptoms Reviewed: Difficulty walking, Other Other Musculoskeletal Symptoms: Patient reports new onset of buttock / leg pain bilaterally.   She states she has not had this before. She states it feels like a charlie horse in the back of her legs. Patient states the pain is not as bad as it was a couple of days ago. She states she has started using her walker on occasion.  Patient states she has a long hallway in her house and she tries to walk it 2-3 times per day for exercise.  Patient states she keeps a cane in her car in case she needs it.  Patient states she is still able to drive for herself and she will be driving to her primary care provider office this afternoon.   Falls in the past year?: No Number of falls in past year: 1 or less Was there an injury with Fall?: No Fall Risk Category Calculator: 0 Patient Fall Risk Level: Low Fall Risk    Psychosocial Psychosocial Symptoms Reported: No symptoms reported         There were no vitals filed for this visit.  Medications Reviewed Today     Reviewed by Shyloh Derosa E, RN (Registered Nurse) on 10/19/23 at 1145  Med List Status: <None>   Medication Order Taking? Sig Documenting Provider Last Dose Status Informant  acetaminophen  (TYLENOL ) 325 MG tablet [id number] Yes Take 650 mg by mouth 2 (two) times daily as needed for moderate pain (pain score 4-6), fever or headache. [provider]  Active Self, Pharmacy Records  albuterol  (VENTOLIN  HFA) [phone] Base) MCG/ACT inhaler [id number] Yes Inhale 1-2 puffs into the lungs every 6 (six) hours as needed for wheezing or shortness of breath. Tower, Laine LABOR, MD  Active Self, Pharmacy Records  bisoprolol  (ZEBETA ) 5 MG tablet [id number] Yes Take 1 tablet (5 mg total) by mouth daily. Gardenia Led, DO  Active Self, Pharmacy Records  famotidine  (PEPCID ) 20 MG tablet [id number]  Take 1 tablet (20 mg total) by mouth 2 (two) times daily.  Patient not taking: Reported on 10/19/2023   Randeen Laine LABOR, MD  Active Self, Pharmacy Records  ferrous sulfate  325 [phone] FE) MG EC tablet [id number] Yes Take 1 tablet (325 mg total) by mouth daily with breakfast. Tower, Laine LABOR, MD  Active Self, Pharmacy Records  fluticasone  (FLONASE ) 50 MCG/ACT nasal spray [id number] Yes Place 1 spray into both nostrils 2 (two) times daily as needed for allergies or rhinitis. Tower, Laine LABOR, MD  Active Self, Pharmacy Records  furosemide  (LASIX ) 40 MG tablet [id number] Yes Take 1 tablet (40 mg total) by  mouth daily. Gardenia Led, DO  Active Self, Pharmacy Records  JARDIANCE  10 MG TABS tablet [id number] Yes TAKE 1 TABLET BY MOUTH DAILY BEFORE BREAKFAST. Sabharwal, Aditya, DO  Active Self, Pharmacy Records  levothyroxine  (SYNTHROID ) 75 MCG tablet [id number] Yes TAKE 1 TABLET EVERY DAY BEFORE BREAKFAST Tower, Laine LABOR, MD  Active Self, Pharmacy Records  losartan  (COZAAR ) 25 MG tablet [id number] Yes TAKE 1 TABLET (25 MG TOTAL) BY MOUTH DAILY. Gardenia Led, DO  Active Self, Pharmacy Records  mexiletine (MEXITIL ) 200 MG capsule [id number] Yes TAKE 1 CAPSULE BY MOUTH TWICE A DAY Fernande Elspeth BROCKS, MD  Active Self, Pharmacy Records  spironolactone  (ALDACTONE ) 25 MG tablet [id number] Yes Take 0.5 tablets (12.5 mg total) by mouth daily. Fernande Elspeth BROCKS, MD  Active Self, Pharmacy Records  UNABLE TO FIND [id number]  Take 1 tablet by mouth daily. Unknown OTC allergy medication, red tablet (not pseudoephedrine). [provider]  Active Self, Pharmacy Records  warfarin (COUMADIN ) 5 MG tablet [id number] Yes Take 0.5-1 tablets (2.5-5 mg total) by mouth See admin instructions. Take 1/2 tablet (2.5mg ) by mouth once daily on Monday, Wednesday, Friday and take 1 tablet (5mg ) once daily on all other days. Laurence Locus, DO  Active             Goals Addressed             This Visit's Progress    VBCI Transitions of Care (TOC) Care Plan       Problems:  Recent Hospitalization for treatment of urinary tract infection Knowledge Deficit Related to urinary tract infection management and concerns related to memory  Goal:  Over the next 30 days, the patient will not experience hospital readmission  Interventions:  Transitions of Care: Doctor Visits  - discussed the importance of doctor visits- Patient scheduled for primary care provider follow up on 10/19/27.  Reviewed upcoming provider appointments and confirmed with patient that she has them documented on her personal calendar. Medications reviewed and  compliance discussed.  Advised patient to notify her provider of symptoms related to numbness around eye and check.  Advised to discuss with provider concerns regarding her memory. Assessed for ongoing UTI symptoms. Advised to drink plenty of water, keep vaginal area clean and wipe front to back.  Advised to report any new or ongoing symptoms to her primary care provider.  Advised to take medications as prescribed Advised to keep follow up appointments with provider Call primary care provider for new or ongoing symptoms Advised patient to report buttock /leg pain to primary provider at scheduled appointment today Advised to consider using a heating pad  buttock/ leg area to help relax muscles.  Discussed fall precautions when using ambulatory device. Advised to use ambulatory device as recommended by provider.   Patient Self Care Activities:  Attend all scheduled provider appointments Call pharmacy for medication refills 3-7 days in advance of running out of medications Call provider office for new concerns or questions  Take medications as prescribed   Call 911 for severe symptoms. Keep follow up appointments with your providers Drink plenty of fluids, keep vaginal area clean and wipe front to back Use ambulatory device as recommended by provider Report new symptoms of buttock/ leg pain to primary care provider.     Plan:  Telephone follow up appointment with care management team member scheduled for:  10/27/23 at 11 am The patient has been provided with contact information for the care management team and has been advised to call with any health related questions or concerns.          Recommendation:   PCP Follow-up Continue Current Plan of Care Report new symptoms of buttock/leg pain to primary care provider.   Follow Up Plan:   Telephone follow-up in 1 week 10/27/23 at 11 am with RN nurse case manager  Arvin Seip RN, BSN, CCM [place]  [facility],  Population Health Case Manager Phone: [phone]

## 2023-10-19 NOTE — Assessment & Plan Note (Signed)
 Noted in hospital GFR 52 Not unusual for age Is on lasix  and aldactone  Lab today

## 2023-10-20 ENCOUNTER — Telehealth: Payer: Self-pay

## 2023-10-20 NOTE — Telephone Encounter (Signed)
 Pt called in stating that she has felt twinges/jabs at her wound site. Pt states its not it hurts its just concerning to her as to why this is happening. Pt is sending a transmission and would like a call back

## 2023-10-20 NOTE — Telephone Encounter (Signed)
 Normal device function. Presenting rhythm VP 81 bpm. No alerts triggered. Lead trends appear stable.   Patient reports feeling small needle/twinges at pacemaker site recently. Denies any redness, swelling or drainage. Explained to patient the symptoms she is experiencing is often nerve pain and she can use tylenol  prn for next few days and apply ice with barrier between her skin and ice pack. Patient voiced understanding and felt reassurance after conversation.   Patient advised if she has any further questions or not improving to let us  know. Pt voiced understanding and agreeable to plan.

## 2023-10-20 NOTE — Progress Notes (Signed)
 Complex Care Management Note  Care Guide Note 10/20/2023 Name: [name] MRN: [id number] DOB: 01-Jan-1933  [name] is a 89 y.o. year old female who sees Tower, Laine LABOR, MD for primary care. I reached out to Ronal JULIANNA Paula by phone today to offer complex care management services.  [name] was given information about Complex Care Management services today including:   The Complex Care Management services include support from the care team which includes your Nurse Care Manager, Clinical Social Worker, or Pharmacist.  The Complex Care Management team is here to help remove barriers to the health concerns and goals most important to you. Complex Care Management services are voluntary, and the patient may decline or stop services at any time by request to their care team member.   Complex Care Management Consent Status: Patient agreed to services and verbal consent obtained.   Follow up plan:  Telephone appointment with complex care management team member scheduled for:  11/11/23 at 1:00 p.m.  Encounter Outcome:  Patient Scheduled  Dreama [facility]  [facility], [facility] Care Management Assistant Direct Dial: [phone]  Fax: [phone]

## 2023-10-21 ENCOUNTER — Telehealth: Payer: Self-pay

## 2023-10-21 ENCOUNTER — Ambulatory Visit (INDEPENDENT_AMBULATORY_CARE_PROVIDER_SITE_OTHER)

## 2023-10-21 DIAGNOSIS — Z7901 Long term (current) use of anticoagulants: Secondary | ICD-10-CM

## 2023-10-21 LAB — CUP PACEART REMOTE DEVICE CHECK
Battery Remaining Longevity: 86 mo
Battery Voltage: 3 V
Brady Statistic AP VP Percent: 0 %
Brady Statistic AP VS Percent: 0 %
Brady Statistic AS VP Percent: 99.37 %
Brady Statistic AS VS Percent: 0.63 %
Brady Statistic RA Percent Paced: 0 %
Brady Statistic RV Percent Paced: 99.37 %
Date Time Interrogation Session: 20250730104045
Implantable Lead Connection Status: 753985
Implantable Lead Implant Date: 20210507
Implantable Lead Location: 753860
Implantable Lead Model: 5076
Implantable Pulse Generator Implant Date: 20210507
Lead Channel Impedance Value: 3306 Ohm
Lead Channel Impedance Value: 3306 Ohm
Lead Channel Impedance Value: 380 Ohm
Lead Channel Impedance Value: 418 Ohm
Lead Channel Pacing Threshold Amplitude: 0.875 V
Lead Channel Pacing Threshold Pulse Width: 0.4 ms
Lead Channel Sensing Intrinsic Amplitude: 1.875 mV
Lead Channel Sensing Intrinsic Amplitude: 12.75 mV
Lead Channel Sensing Intrinsic Amplitude: 8.625 mV
Lead Channel Setting Pacing Amplitude: 2.5 V
Lead Channel Setting Pacing Pulse Width: 0.4 ms
Lead Channel Setting Sensing Sensitivity: 4 mV
Zone Setting Status: 755011

## 2023-10-21 LAB — POCT INR: INR: 1.8 — AB (ref 2.0–3.0)

## 2023-10-21 NOTE — Patient Instructions (Addendum)
 Pre visit review using our clinic review tool, if applicable. No additional management support is needed unless otherwise documented below in the visit note.  Increase dose today to take 1 1/2 tablets and then change weekly dose to take 1 tablet daily except take 1/2 tablet on Monday and Friday. Recheck in 2 weeks.

## 2023-10-21 NOTE — Telephone Encounter (Signed)
 Thanks  We will discuss at our follow up tomorrow

## 2023-10-21 NOTE — Telephone Encounter (Signed)
 Pt in coumadin  clinic today.  Pt reports today her leg cramps have not gotten any better. She also reports she does not have an appetite anymore and eats very little. Pt reported she is struggling mentally thinking about having to sell her home and move into an assisted living. Inquired if pt is willing to start an antidepressant to help her cope with her sadness. Pt stated, I think I am ready to take something. Advised a msg would be sent to her PCP to let her know she is ready to start a medication for depression. Advised if she ever needed to talk to feel free to contact the coumadin  clinic. Pt verbalized understanding and was appreciative of the help.

## 2023-10-21 NOTE — Progress Notes (Signed)
 Pt reports today her leg cramps have not gotten any better. She also reports she does not have an appetite anymore and eats very little. Pt reported she is struggling mentally thinking about having to sell her home and move into an assisted living. Inquired if pt is willing to start an antidepressant to help her cope with her sadness. Pt stated, I think I am ready to take something. Advised a msg would be sent to her PCP to let her know she is ready to start a medication for depression. Advised if she ever needed to talk to feel free to contact the coumadin  clinic. Pt verbalized understanding and was appreciative of the help. Sent msg to PCP concerning above.  Increase dose today to take 1 1/2 tablets and then change weekly dose to take 1 tablet daily except take 1/2 tablet on Monday and Friday . Recheck in 2 weeks.

## 2023-10-22 ENCOUNTER — Inpatient Hospital Stay: Admitting: Family Medicine

## 2023-10-22 ENCOUNTER — Encounter: Payer: Self-pay | Admitting: Oncology

## 2023-10-22 ENCOUNTER — Ambulatory Visit (INDEPENDENT_AMBULATORY_CARE_PROVIDER_SITE_OTHER)

## 2023-10-22 DIAGNOSIS — I4430 Unspecified atrioventricular block: Secondary | ICD-10-CM | POA: Diagnosis not present

## 2023-10-23 LAB — VITAMIN B6: Vitamin B6: 21.2 ng/mL (ref 2.1–21.7)

## 2023-10-25 ENCOUNTER — Encounter: Payer: Self-pay | Admitting: Family Medicine

## 2023-10-25 ENCOUNTER — Ambulatory Visit (INDEPENDENT_AMBULATORY_CARE_PROVIDER_SITE_OTHER)
Admission: RE | Admit: 2023-10-25 | Discharge: 2023-10-25 | Disposition: A | Discharge status: Home / Self Care | Source: Ambulatory Visit | Attending: Family Medicine | Admitting: Family Medicine

## 2023-10-25 ENCOUNTER — Ambulatory Visit: Payer: Self-pay | Admitting: Family Medicine

## 2023-10-25 ENCOUNTER — Ambulatory Visit (INDEPENDENT_AMBULATORY_CARE_PROVIDER_SITE_OTHER): Admitting: Family Medicine

## 2023-10-25 ENCOUNTER — Ambulatory Visit: Payer: Self-pay | Admitting: Cardiology

## 2023-10-25 VITALS — BP 102/62 | HR 76 | Temp 97.8°F | Ht 65.0 in | Wt 173.1 lb

## 2023-10-25 DIAGNOSIS — R5382 Chronic fatigue, unspecified: Secondary | ICD-10-CM | POA: Diagnosis not present

## 2023-10-25 DIAGNOSIS — L299 Pruritus, unspecified: Secondary | ICD-10-CM

## 2023-10-25 DIAGNOSIS — M47816 Spondylosis without myelopathy or radiculopathy, lumbar region: Secondary | ICD-10-CM | POA: Diagnosis not present

## 2023-10-25 DIAGNOSIS — M79606 Pain in leg, unspecified: Secondary | ICD-10-CM | POA: Diagnosis not present

## 2023-10-25 DIAGNOSIS — M79604 Pain in right leg: Secondary | ICD-10-CM | POA: Diagnosis not present

## 2023-10-25 DIAGNOSIS — R413 Other amnesia: Secondary | ICD-10-CM

## 2023-10-25 DIAGNOSIS — M5136 Other intervertebral disc degeneration, lumbar region with discogenic back pain only: Secondary | ICD-10-CM | POA: Diagnosis not present

## 2023-10-25 DIAGNOSIS — R252 Cramp and spasm: Secondary | ICD-10-CM

## 2023-10-25 DIAGNOSIS — M79605 Pain in left leg: Secondary | ICD-10-CM

## 2023-10-25 DIAGNOSIS — F418 Other specified anxiety disorders: Secondary | ICD-10-CM

## 2023-10-25 DIAGNOSIS — M4316 Spondylolisthesis, lumbar region: Secondary | ICD-10-CM | POA: Diagnosis not present

## 2023-10-25 DIAGNOSIS — M51369 Other intervertebral disc degeneration, lumbar region without mention of lumbar back pain or lower extremity pain: Secondary | ICD-10-CM

## 2023-10-25 MED ORDER — SERTRALINE HCL 25 MG PO TABS: 25.0000 mg | ORAL_TABLET | Freq: Every day | ORAL | 1 refills | Status: DC

## 2023-10-25 NOTE — Assessment & Plan Note (Signed)
 Labs reviewed  Life changes and depression may play a role

## 2023-10-25 NOTE — Assessment & Plan Note (Signed)
?   If related to age or depression or both  Will begin depression treatment with sertraline  and follow up in 2 wk for further eval

## 2023-10-25 NOTE — Assessment & Plan Note (Addendum)
 Pt is open to idea of treatment now  Discussed options  Declines counseling for now / has good support  Would benefit from more socialization  Depression may affect her short term memory and cognition also  Labs reviewed   Will try sertraline  25 mg daily in evening for depression/anxiety /appetite May help further with sleep and may help with itching   Discussed expectations of SSRI medication including time to effectiveness and mechanism of action, also poss of side effects (early and late)- including mental fuzziness, weight or appetite change, nausea and poss of worse dep or anxiety (even suicidal thoughts)  Pt voiced understanding and will stop med and update if this occurs    Follow up 2 wk or earlier if needed   Call back and Er precautions noted in detail today

## 2023-10-25 NOTE — Assessment & Plan Note (Addendum)
 Pt has bilateral posterior thigh leg cramps upon rising and starting movement Feels like muscle cramps Overall improved from last visit but still present No source found in labs-reviewed today  Reassuring exam  May benefit from stretching or PT LS film ordered-pending result   Call back and Er precautions noted in detail today

## 2023-10-25 NOTE — Patient Instructions (Addendum)
 Xray of back today  I want to see if there is a potential cause of your leg pain and spasms We will reach out with results and a plan    Try sertraline  (generic zoloft ) 25 mg daily in the evening -this is for mood and appetite and also pain and itching   If any intolerable side effects or if it makes you feel more anxious or depressed-stop it and let us  know   If itching does not improve please let me know (aware you have used hydroxyzine  in the past but I don't want to start 2 new things at the same time   If you would be open to counseling (mental health) let us  know   Please follow up in 2 weeks

## 2023-10-25 NOTE — Assessment & Plan Note (Addendum)
 Without rash  Waxes and wanes Severe itch  In setting of anxiety - but pt does not see relationship to it time wise  Had old prescription for hydroxyzine  25 mg and wants refill  Hesitant in terms of age and Beer's criteria for sedation/falls   Trying sertraline  for anx /sleep- this may help  Will update  May have to consider lower dose hydroxyzine  or other antihistamine prn if not improved  Call back and Er precautions noted in detail today

## 2023-10-25 NOTE — Assessment & Plan Note (Signed)
 In legs-see a/p for LE pain  Happens when getting up to move Improved from last visit but not gone Labs and exam reassuring   LS xr today  ? If PT would be helpful

## 2023-10-25 NOTE — Progress Notes (Signed)
 Subjective:    Patient ID: [name], female    DOB: 1932-10-07, 89 y.o.   MRN: [id number]  HPI  Wt Readings from Last 3 Encounters:  10/25/23 173 lb 2 oz (78.5 kg)  10/19/23 172 lb (78 kg)  10/08/23 175 lb 4.3 oz (79.5 kg)   28.81 kg/m  Vitals:   10/25/23 1456  BP: 102/62  Pulse: 76  Temp: 97.8 F (36.6 C)  SpO2: 98%    Pt presents for follow up of  Fatigue  Memory Mood  Muscle cramps  In setting of hosp for uti recently   Did large lab profile on 7/29  Also notices itching -very deep itch Little bumps  Dry skin  Used to take hydroxyzine  at night-only if abs needed     Lab Results  Component Value Date   NA 140 10/19/2023   K 4.0 10/19/2023   CO2 31 10/19/2023   GLUCOSE 95 10/19/2023   BUN 23 10/19/2023   CREATININE 0.90 10/19/2023   CALCIUM  9.2 10/19/2023   GFR 56.89 (L) 10/19/2023   EGFR 54 (L) 01/26/2023   GFRNONAA 52 (L) 10/07/2023   CK normal at 55 Lab Results  Component Value Date   IRON  52 10/19/2023   TIBC 350 12/28/2018   FERRITIN 147.2 10/19/2023   Folate normal at 8.7 Lab Results  Component Value Date   VITAMINB12 254 10/19/2023   B6 normal at 21.2  Lab Results  Component Value Date   WBC 4.7 10/19/2023   HGB 12.9 10/19/2023   HCT 38.9 10/19/2023   MCV 87.6 10/19/2023   PLT 190.0 10/19/2023   Sed rate 58 (this is about her baseline)    Magnesium 2.4   Lab Results  Component Value Date   TSH 4.14 10/19/2023   Taking levothyroxine  75 mcg daily   Lab Results  Component Value Date   ALT 10 10/19/2023   AST 16 10/19/2023   ALKPHOS 40 10/19/2023   BILITOT 0.5 10/19/2023     Overall , reassuring labs  Pt noted she may be ready to treat depression  More sad  May benefit from more socialization   Frustrated by her physical limitations  Still reads a lot   Sleep- once she goes to bed is ok (does get up to go to the bathroom) Appetite -terrible   Balance affected by leg cramps  Did get a walker for house to  use as needed (also has a cane)  Even short distances worse in the right  Starts in buttocks and goes down back of legs  Not as bad as it was but still bothersome  Low back bothers her a bit/not bad - pinches a bit  Feels like abd muscles are weak     We did refer for community care management      10/11/2023   11:14 AM 09/02/2023    9:26 AM 03/22/2023    8:35 AM 03/18/2022   11:12 AM 11/06/2021    8:25 AM  Depression screen PHQ 2/9  Decreased Interest 0 0 0 0 0  Down, Depressed, Hopeless 0 0 1 0 1  PHQ - 2 Score 0 0 1 0 1      09/02/2023    9:26 AM  GAD 7 : Generalized Anxiety Score  Nervous, Anxious, on Edge 0  Control/stop worrying 0  Worry too much - different things 0  Trouble relaxing 0  Restless 0  Easily annoyed or irritable 0  Afraid - awful might  happen 0  Total GAD 7 Score 0        Patient Active Problem List   Diagnosis Date Noted   Leg pain, bilateral 10/25/2023   Muscle cramps 10/19/2023   Decreased GFR 10/19/2023   Memory change 10/18/2023   Nonrheumatic mitral valve regurgitation 09/21/2022   Other fatigue 10/08/2021   Salt craving 06/25/2021   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/20/2020   Hearing loss 06/13/2020   S/P placement of cardiac pacemaker MDT 07/28/19 07/29/2019   History of CVA (cerebrovascular accident) 06/16/2019   Facial tingling 11/29/2018   Tremor of left hand 11/29/2018   Iron  deficiency anemia 10/21/2017   Constipation 08/02/2017   Numbness in feet 07/14/2017   Hip osteoarthritis 04/27/2017   Long term (current) use of anticoagulants - on coumadin  for afib 03/04/2017   Venous stasis dermatitis of both lower extremities 01/08/2017   Osteopenia 10/25/2016   Pedal edema 08/26/2016   Varicose veins of both lower extremities 08/26/2016   Estrogen deficiency 08/26/2016   Hemorrhoids 08/26/2016   History of nonmelanoma skin cancer 01/01/2016   Pruritus 10/04/2015   Hip pain 08/02/2014   Left knee pain 08/02/2014   Chronic cough  05/08/2014   Fatigue 08/16/2013   Left ovarian cyst 03/14/2013   (HFpEF) heart failure with preserved ejection fraction (HCC) 12/27/2012   PULMONARY NODULE 12/20/2008   Ganglion 10/04/2007   Mixed incontinence 04/28/2007   Hyperlipidemia 04/27/2007   Depression with anxiety 04/27/2007   Asthma, mild intermittent 04/27/2007   Insomnia 04/27/2007   Acquired hypothyroidism 09/02/2006   Atrial fibrillation, chronic (HCC) 08/05/2006   Past Medical History:  Diagnosis Date   ADENOMATOUS COLONIC POLYP 11/04/2006   Qualifier: Diagnosis of   By: Earlean CMA (AAMA), Amanda         Allergic rhinitis    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 s/p PVI [name] 2010;  b. on tikosyn /coumadin ;  c. 05/2009 Echo: EF 60-65%, Gr 2 DD. (first dx 09/ 2007)   AV block 07/28/2019   Bilateral lower extremity edema    Bleeding hemorrhoid    Carotid stenosis    mild (hosp 3/11)- consult by vasc/ Dr Oris   Complication of anesthesia    hard to wake   Diverticulosis of colon    Dyspnea    on exertion-climbing stairs   Fatty liver    H/O cardiac radiofrequency ablation    01/ 2008 at [facility]  /  03/ 2010  at [facility]   Heart failure with preserved ejection fraction [facility])    History of adenomatous polyp of colon    tubular adenoma's   History of cardiomyopathy    secondary tachycardia-induced cardiomyopathy -- resolved 2014   History of colonic polyps 09/18/2009   Qualifier: Diagnosis of   By: Genie CMA LEODIS), Chick      IMO SNOMED Dx Update Oct 2024   Formatting of this note might be different from the original. Qualifier: Diagnosis of By: Kowalk CMA LEODIS), Chick     History of squamous cell carcinoma in situ (SCCIS) of skin    05/ 2017  nasal bridge and right medial knee   History of transient ischemic attack (TIA)    01-24-2005 and 06-12-2009   Hyperlipidemia     Hypothyroidism    Mild intermittent asthma    reacts to cats   Mixed stress  and urge urinary incontinence    Presence of heart assist device (HCC) 03/25/2023   Presence of permanent cardiac pacemaker    was put in 07/2019   Pulmonary nodule    S/P AV nodal ablation 07/28/19 07/29/2019   S/P mitral valve repair 10-23-1998  dr lansing at [facility]   for MVP and regurg. 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   Past Surgical History:  Procedure Laterality Date   APPENDECTOMY  1978   AV NODE ABLATION N/A 07/28/2019   Procedure: AV NODE ABLATION;  Surgeon: Fernande Elspeth BROCKS, MD;  Location: [facility] INVASIVE CV LAB;  Service: Cardiovascular;  Laterality: N/A;   BUBBLE STUDY  06/19/2019   Procedure: BUBBLE STUDY;  Surgeon: Mona Vinie BROCKS, MD;  Location: [facility] ENDOSCOPY;  Service: Cardiovascular;;   CARDIAC ELECTROPHYSIOLOGY MAPPING AND ABLATION  01/ 2008    at [facility]    right-sided ablation atrial flutter   CARDIAC ELECTROPHYSIOLOGY STUDY AND ABLATION  03/ 2010   dr marland at [facility]   AV node ablation and pulmonary vein isolation for atrial fib   CARDIOVERSION  06-18-2006;  07-13-2006;  10-19-2010;  10-27-2010   CATARACT EXTRACTION W/PHACO Right 04/09/2021   Procedure: CATARACT EXTRACTION PHACO AND INTRAOCULAR LENS PLACEMENT (IOC) RIGHT 6.71 01:11.1;  Surgeon: Mittie Gaskin, MD;  Location: [facility] SURGERY CNTR;  Service: Ophthalmology;  Laterality: Right;   CATARACT EXTRACTION W/PHACO Left 04/23/2021   Procedure: CATARACT EXTRACTION PHACO AND INTRAOCULAR LENS PLACEMENT (IOC) LEFT;  Surgeon: Mittie Gaskin, MD;  Location: [facility] SURGERY CNTR;  Service: Ophthalmology;  Laterality: Left;  [place] 5.65 00:50.1   COLONOSCOPY     COLONOSCOPY WITH PROPOFOL  N/A 10/13/2017   Procedure: COLONOSCOPY WITH PROPOFOL ;  Surgeon: Therisa Bi, MD;  Location: [facility] ENDOSCOPY;  Service:  Gastroenterology;  Laterality: N/A;   COLONOSCOPY WITH PROPOFOL  N/A 02/20/2020   Procedure: COLONOSCOPY WITH PROPOFOL ;  Surgeon: Maryruth Ole DASEN, MD;  Location: [facility] ENDOSCOPY;  Service: Endoscopy;  Laterality: N/A;   CYSTO/ TRANSURETHRAL COLLAGEN INJECTION THERAPY  07-26-2007   dr gaston   DILATION AND CURETTAGE OF UTERUS     ESOPHAGOGASTRODUODENOSCOPY (EGD) WITH PROPOFOL  N/A 10/13/2017   Procedure: ESOPHAGOGASTRODUODENOSCOPY (EGD) WITH PROPOFOL ;  Surgeon: Therisa Bi, MD;  Location: [facility] ENDOSCOPY;  Service: Gastroenterology;  Laterality: N/A;   EVALUATION UNDER ANESTHESIA WITH HEMORRHOIDECTOMY N/A 04/09/2020   Procedure: EXAM UNDER ANESTHESIA WITH HEMORRHOIDECTOMY;  Surgeon: Jordis Laneta FALCON, MD;  Location: [facility] ORS;  Service: General;  Laterality: N/A;   EXCISIONAL HEMORRHOIDECTOMY  1980s   GIVENS CAPSULE STUDY N/A 12/08/2017   Procedure: GIVENS CAPSULE STUDY;  Surgeon: Therisa Bi, MD;  Location: [facility] ENDOSCOPY;  Service: Gastroenterology;  Laterality: N/A;   HEMORRHOID SURGERY N/A 10/29/2016   Procedure: HEMORRHOIDECTOMY;  Surgeon: Debby Hila, MD;  Location: [facility];  Service: General;  Laterality: N/A;   MITRAL VALVE ANNULOPLASTY  10/23/1998   Model [id number]; [place] [id number]; size 32mm; [facility]; Dr. lansing   PACEMAKER IMPLANT N/A 07/28/2019   Procedure: PACEMAKER IMPLANT;  Surgeon: Fernande Elspeth BROCKS, MD;  Location: [facility] INVASIVE CV LAB;  Service: Cardiovascular;  Laterality: N/A;   PILONIDAL CYST EXCISION  1954   RIGHT/LEFT HEART CATH AND CORONARY ANGIOGRAPHY Bilateral 09/21/2022   Procedure: RIGHT/LEFT HEART CATH AND CORONARY ANGIOGRAPHY;  Surgeon: Darron Deatrice LABOR, MD;  Location: [facility] INVASIVE CV LAB;  Service: Cardiovascular;  Laterality: Bilateral;   TEE WITH CARDIOVERSION  05-06-2006 at [facility];  01-02-2013 at [facility]   TEE WITHOUT CARDIOVERSION N/A 06/19/2019   Procedure: TRANSESOPHAGEAL ECHOCARDIOGRAM (TEE);  Surgeon: Mona Vinie BROCKS, MD;  Location: [facility]  ENDOSCOPY;  Service: Cardiovascular;  Laterality: N/A;   TOTAL HIP ARTHROPLASTY Left 05/04/2017   Procedure: LEFT TOTAL HIP ARTHROPLASTY ANTERIOR APPROACH;  Surgeon: Vernetta Lonni GRADE, MD;  Location: [facility] OR;  Service: Orthopedics;  Laterality: Left;   TRANSTHORACIC ECHOCARDIOGRAM  05-01-2015   dr fernande   ef 50-55%/  mild AV sclerosis without stenosis/  post MV repair with mild central MR (valve area by pressure half-time 2cm^2,  valve area by continutity equation 0.91cm^2, peak grandiant 64mmHg)/  severe LAE/ mild TR/ mild RAE    TUBAL LIGATION Bilateral 1978   Social History   Tobacco Use   Smoking status: Never   Smokeless tobacco: Never  Vaping Use   Vaping status: Never Used  Substance Use Topics   Alcohol  use: Not Currently    Comment: seldom   Drug use: No   Family History  Problem Relation Age of Onset   Lung cancer Father        smoker, died at 59   Alcohol  abuse Father    Cancer Father        bladder and lung CA smoker   Sudden death Other    Breast cancer Neg Hx    Stroke Neg Hx    Allergies  Allergen Reactions   Pacerone [Amiodarone] Swelling   Penicillins Hives and Rash    Tolerates ceftriaxone  during July 2025 admission   Zetia  [Ezetimibe ] Other (See Comments)    Unknown reaction   Lopressor  [Metoprolol ] Other (See Comments)    Hair loss   Statins Rash   Current Outpatient Medications on File Prior to Visit  Medication Sig Dispense Refill   acetaminophen  (TYLENOL ) 325 MG tablet Take 650 mg by mouth 2 (two) times daily as needed for moderate pain (pain score 4-6), fever or headache.     albuterol  (VENTOLIN  HFA) 108 (90 Base) MCG/ACT inhaler Inhale 1-2 puffs into the lungs every 6 (six) hours as needed for wheezing or shortness of breath. 18 g 2   bisoprolol  (ZEBETA ) 5 MG tablet Take 1 tablet (5 mg total) by mouth daily. 30 tablet 11   famotidine  (PEPCID ) 20 MG tablet Take 1 tablet (20 mg total) by mouth 2 (two) times daily. 60 tablet 1   ferrous sulfate   325 (65 FE) MG EC tablet Take 1 tablet (325 mg total) by mouth daily with breakfast. 90 tablet 2   fluticasone  (FLONASE ) 50 MCG/ACT nasal spray Place 1 spray into both nostrils 2 (two) times daily as needed for allergies or rhinitis. 16 g 11   furosemide  (LASIX ) 40 MG tablet Take 1 tablet (40 mg total) by mouth daily. 90 tablet 3   JARDIANCE  10 MG TABS tablet TAKE 1 TABLET BY MOUTH DAILY BEFORE BREAKFAST. 30 tablet 6   levothyroxine  (SYNTHROID ) 75 MCG tablet TAKE 1 TABLET EVERY DAY BEFORE BREAKFAST 90 tablet 1   losartan  (COZAAR ) 25 MG tablet TAKE 1 TABLET (25 MG TOTAL) BY MOUTH DAILY. 90 tablet 3   mexiletine (MEXITIL ) 200 MG capsule TAKE 1 CAPSULE BY MOUTH TWICE A DAY 180 capsule 1   spironolactone  (ALDACTONE ) 25 MG tablet Take 0.5 tablets (12.5 mg total) by mouth daily. 45 tablet 3   UNABLE TO FIND Take 1 tablet by mouth daily. Unknown OTC allergy medication, red tablet (not pseudoephedrine).     warfarin (COUMADIN ) 5 MG tablet Take 0.5-1 tablets (  2.5-5 mg total) by mouth See admin instructions. Take 1/2 tablet (2.5mg ) by mouth once daily on Monday, Wednesday, Friday and take 1 tablet (5mg ) once daily on all other days.     No current facility-administered medications on file prior to visit.    Review of Systems  Constitutional:  Positive for appetite change and fatigue. Negative for activity change, fever and unexpected weight change.  HENT:  Negative for congestion, ear pain, rhinorrhea, sinus pressure and sore throat.   Eyes:  Negative for pain, redness and visual disturbance.  Respiratory:  Negative for cough, shortness of breath and wheezing.   Cardiovascular:  Negative for chest pain and palpitations.  Gastrointestinal:  Negative for abdominal pain, blood in stool, constipation and diarrhea.  Endocrine: Negative for polydipsia and polyuria.  Genitourinary:  Negative for dysuria, frequency and urgency.  Musculoskeletal:  Positive for myalgias. Negative for arthralgias and back pain.   Skin:  Negative for pallor and rash.  Allergic/Immunologic: Negative for environmental allergies.  Neurological:  Negative for dizziness, syncope and headaches.  Hematological:  Negative for adenopathy. Does not bruise/bleed easily.  Psychiatric/Behavioral:  Positive for decreased concentration and dysphoric mood. Negative for self-injury, sleep disturbance and suicidal ideas. The patient is nervous/anxious.        Objective:   Physical Exam Constitutional:      General: She is not in acute distress.    Appearance: Normal appearance. She is well-developed. She is not ill-appearing or diaphoretic.  HENT:     Head: Normocephalic and atraumatic.  Eyes:     Conjunctiva/sclera: Conjunctivae normal.     Pupils: Pupils are equal, round, and reactive to light.  Neck:     Thyroid : No thyromegaly.     Vascular: No carotid bruit or JVD.  Cardiovascular:     Rate and Rhythm: Normal rate and regular rhythm.     Heart sounds: Normal heart sounds.     No gallop.  Pulmonary:     Effort: Pulmonary effort is normal. No respiratory distress.     Breath sounds: Normal breath sounds. No stridor. No wheezing, rhonchi or rales.  Abdominal:     General: There is no distension or abdominal bruit.     Palpations: Abdomen is soft.     Tenderness: There is no abdominal tenderness.  Musculoskeletal:     Cervical back: Normal range of motion and neck supple.     Right lower leg: No edema.     Left lower leg: No edema.     Comments: No LE tenderness Normal rom of joints incl hips and ankles  No palp cords Varicosities noted     Lymphadenopathy:     Cervical: No cervical adenopathy.  Skin:    General: Skin is warm and dry.     Coloration: Skin is not jaundiced or pale.     Findings: No bruising, erythema or rash.     Comments: No rash or excoriations   Neurological:     Mental Status: She is alert.     Coordination: Coordination normal.     Deep Tendon Reflexes: Reflexes are normal and  symmetric. Reflexes normal.  Psychiatric:        Attention and Perception: Attention normal.        Mood and Affect: Mood is anxious and depressed. Affect is tearful.        Speech: Speech normal.        Behavior: Behavior normal.        Thought Content: Thought content normal.  Comments: Candidly discusses symptoms and stressors   A bit tearful at times  Fairly sharp today           Assessment & Plan:   Problem List Items Addressed This Visit       Musculoskeletal and Integument   Pruritus   Without rash  Waxes and wanes Severe itch  In setting of anxiety - but pt does not see relationship to it time wise  Had old prescription for hydroxyzine  25 mg and wants refill  Hesitant in terms of age and Beer's criteria for sedation/falls   Trying sertraline  for anx /sleep- this may help  Will update  May have to consider lower dose hydroxyzine  or other antihistamine prn if not improved  Call back and Er precautions noted in detail today           Other   Muscle cramps   In legs-see a/p for LE pain  Happens when getting up to move Improved from last visit but not gone Labs and exam reassuring   LS xr today  ? If PT would be helpful      Memory change   ? If related to age or depression or both  Will begin depression treatment with sertraline  and follow up in 2 wk for further eval      Leg pain, bilateral   Pt has bilateral posterior thigh leg cramps upon rising and starting movement Feels like muscle cramps Overall improved from last visit but still present No source found in labs-reviewed today  Reassuring exam  May benefit from stretching or PT LS film ordered-pending result   Call back and Er precautions noted in detail today         Relevant Orders   DG Lumbar Spine 2-3 Views (Completed)   Fatigue   Labs reviewed  Life changes and depression may play a role       Depression with anxiety - Primary   Pt is open to idea of treatment now   Discussed options  Declines counseling for now / has good support  Would benefit from more socialization  Depression may affect her short term memory and cognition also  Labs reviewed   Will try sertraline  25 mg daily in evening for depression/anxiety /appetite May help further with sleep and may help with itching   Discussed expectations of SSRI medication including time to effectiveness and mechanism of action, also poss of side effects (early and late)- including mental fuzziness, weight or appetite change, nausea and poss of worse dep or anxiety (even suicidal thoughts)  Pt voiced understanding and will stop med and update if this occurs    Follow up 2 wk or earlier if needed   Call back and Er precautions noted in detail today        Relevant Medications   sertraline  (ZOLOFT ) 25 MG tablet

## 2023-10-27 ENCOUNTER — Other Ambulatory Visit: Payer: Self-pay

## 2023-10-27 DIAGNOSIS — M51369 Other intervertebral disc degeneration, lumbar region without mention of lumbar back pain or lower extremity pain: Secondary | ICD-10-CM | POA: Insufficient documentation

## 2023-10-27 NOTE — Transitions of Care (Post Inpatient/ED Visit) (Signed)
 Transition of Care week 3  Visit Note  10/27/2023  Name: [name] MRN: [id number]          DOB: 14-Oct-1932  Situation: Patient enrolled in [facility] 30-day program. Visit completed with patient by telephone.   Background: Hospitalization 10/05/23 through 10/08/23 for Acute cystitis without hematuria.   Patient with ongoing bilateral leg/ buttock pain, depression like symptoms and memory concerns.   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 s/p PVI [name] 2010;  b. on tikosyn /coumadin ;  c. 05/2009 Echo: EF 60-65%, Gr 2 DD. (first dx 09/ 2007)   AV block 07/28/2019   Bilateral lower extremity edema    Bleeding hemorrhoid    Carotid stenosis    mild (hosp 3/11)- consult by vasc/ Dr Oris   Complication of anesthesia    hard to wake   Diverticulosis of colon    Dyspnea    on exertion-climbing stairs   Fatty liver    H/O cardiac radiofrequency ablation    01/ 2008 at [facility]  /  03/ 2010  at [facility] failure with preserved ejection fraction [facility])    History of adenomatous polyp of colon    tubular adenoma's   History of cardiomyopathy    secondary tachycardia-induced cardiomyopathy -- resolved 2014   History of colonic polyps 09/18/2009   Qualifier: Diagnosis of   By: Genie CMA LEODIS), Chick      IMO SNOMED Dx Update Oct 2024   Formatting of this note might be different from the original. Qualifier: Diagnosis of By: Kowalk CMA LEODIS), Chick     History of squamous cell carcinoma in situ (SCCIS) of skin    05/ 2017  nasal bridge and right medial knee   History of transient ischemic attack (TIA)    01-24-2005 and 06-12-2009    Hyperlipidemia    Hypothyroidism    Mild intermittent asthma    reacts to cats   Mixed stress and urge urinary incontinence    Presence of heart assist device (HCC) 03/25/2023   Presence of permanent cardiac pacemaker    was put in 07/2019   Pulmonary nodule    S/P AV nodal ablation 07/28/19 07/29/2019   S/P mitral valve repair 10-23-1998  dr lansing at [facility]   for MVP and regurg. 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 She states she writes things down and uses a calendar for her appointments. patient states she has discussed her concerns with her provider.) Cognitive/Intellectual Conditions Management [RPT]: None reported or documented in medical history or problem list      Neurological Neurological Review of Symptoms: No symptoms reported    HEENT HEENT Symptoms Reported: No symptoms reported      Cardiovascular Cardiovascular Symptoms Reported: No symptoms reported  Respiratory Respiratory Symptoms Reported: No symptoms reported    Endocrine Endocrine Symptoms Reported: No symptoms reported    Gastrointestinal Gastrointestinal Symptoms Reported: No symptoms reported      Genitourinary Genitourinary Symptoms Reported: Incontinence Genitourinary Management Strategies: Incontinence garment/pad  Integumentary Integumentary Symptoms Reported: No symptoms reported    Musculoskeletal Musculoskelatal Symptoms Reviewed: Difficulty walking Additional Musculoskeletal Details: Patient states she is using a walker on occasion due to the ongoing leg pain.  She reports having follow up visit with her primary care provider on  10/25/23. She reports having xrays of her back that day. Per chart review message sent by primary care provider to patient regarding PT referral. Musculoskeletal Management Strategies: Routine screening, Medical device Falls in the past year?: No Number of falls in past year: 1 or less Was there an injury with Fall?: No Fall Risk Category Calculator: 0 Patient Fall Risk Level: Low Fall Risk    Psychosocial Psychosocial Symptoms Reported: Other Additional Psychological Details: patient states she feels like social isolation is contributing to some of her depression like symptoms.  Reminded patient she has an upcoming appointment with the social worker on 11/11/23 and will need to pick up her prescription for sertraline  at the pharmacy.  Patient verbalized understanding and agreement. Behavioral Management Strategies: Medication therapy (routine follow up)   Quality of Family Relationships: supportive Do you feel physically threatened by others?: No   There were no vitals filed for this visit.  Medications Reviewed Today     Reviewed by Jonthan Leite E, RN (Registered Nurse) on 10/27/23 at 1148  Med List Status: <None>   Medication Order Taking? Sig Documenting Provider Last Dose Status Informant  acetaminophen  (TYLENOL ) 325 MG tablet [id number] Yes Take 650 mg by mouth 2 (two) times daily as needed for moderate pain (pain score 4-6), fever or headache. [provider]  Active Self, Pharmacy Records  albuterol  (VENTOLIN  HFA) 108 (90 Base) MCG/ACT inhaler [id number] Yes Inhale 1-2 puffs into the lungs every 6 (six) hours as needed for wheezing or shortness of breath. Tower, Laine LABOR, MD  Active Self, Pharmacy Records  bisoprolol  (ZEBETA ) 5 MG tablet [id number] Yes Take 1 tablet (5 mg total) by mouth daily. Gardenia Led, DO  Active Self, Pharmacy Records  famotidine  (PEPCID ) 20 MG tablet [id number] Yes Take 1 tablet (20 mg total) by mouth 2 (two) times daily. Tower, Laine LABOR, MD  Active  Self, Pharmacy Records  ferrous sulfate  325 [phone] FE) MG EC tablet [id number] Yes Take 1 tablet (325 mg total) by mouth daily with breakfast. Tower, Laine LABOR, MD  Active Self, Pharmacy Records  fluticasone  (FLONASE ) 50 MCG/ACT nasal spray [id number] Yes Place 1 spray into both nostrils 2 (two) times daily as needed for allergies or rhinitis. Tower, Laine LABOR, MD  Active Self, Pharmacy Records  furosemide  (LASIX ) 40 MG tablet [id number] Yes Take 1 tablet (40 mg total) by mouth daily. Gardenia Led, DO  Active Self, Pharmacy Records  JARDIANCE  10 MG TABS tablet [id number] Yes TAKE 1 TABLET BY MOUTH DAILY BEFORE BREAKFAST. Sabharwal, Aditya, DO  Active Self, Pharmacy Records  levothyroxine  (SYNTHROID ) 75 MCG tablet [id number] Yes TAKE 1 TABLET EVERY DAY BEFORE BREAKFAST Tower, Laine LABOR, MD  Active Self, Pharmacy Records  losartan  (COZAAR ) 25 MG tablet [id number] Yes TAKE 1 TABLET (25 MG TOTAL) BY MOUTH DAILY. Gardenia Led, DO  Active Self, Pharmacy Records  mexiletine (MEXITIL ) 200 MG capsule [id number] Yes TAKE 1 CAPSULE BY MOUTH TWICE A DAY Fernande Elspeth BROCKS, MD  Active  Self, Pharmacy Records  sertraline  (ZOLOFT ) 25 MG tablet [id number] Yes Take 1 tablet (25 mg total) by mouth daily. In evening Tower, Laine LABOR, MD  Active   spironolactone  (ALDACTONE ) 25 MG tablet [id number] Yes Take 0.5 tablets (12.5 mg total) by mouth daily. Fernande Elspeth BROCKS, MD  Active Self, Pharmacy Records  UNABLE TO FIND [id number] Yes Take 1 tablet by mouth daily. Unknown OTC allergy medication, red tablet (not pseudoephedrine). [provider]  Active Self, Pharmacy Records  warfarin (COUMADIN ) 5 MG tablet [id number] Yes Take 0.5-1 tablets (2.5-5 mg total) by mouth See admin instructions. Take 1/2 tablet (2.5mg ) by mouth once daily on Monday, Wednesday, Friday and take 1 tablet (5mg ) once daily on all other days. Laurence Locus, DO  Active             Goals Addressed             This Visit's Progress    VBCI Transitions of  Care (TOC) Care Plan       Problems:  Recent Hospitalization for treatment of urinary tract infection Knowledge Deficit Related to urinary tract infection management and concerns related to memory  Goal:  Over the next 30 days, the patient will not experience hospital readmission  Interventions:  Transitions of Care: Doctor Visits  - discussed the importance of doctor visits- Reviewed upcoming provider appointments and confirmed with patient that she has them documented on her personal calendar. Medications reviewed and compliance discussed.  Reminded patient to pick up her prescribed sertraline  medication at pharmacy.  Reminded patient of upcoming social worker appointment date/ time.  Assessed buttock/ leg pain Assessed for ongoing UTI symptoms. Advised to drink plenty of water, keep vaginal area clean and wipe front to back.  Advised to report any new or ongoing symptoms to her primary care provider.  Advised to keep follow up appointments with provider Call primary care provider for new or ongoing symptoms Discussed fall prevention due to patient using walker on occasion for ambulation and due to ongoing leg pain Advised to consider using a heating pad to buttock/ leg area to help relax muscles.  Advised to follow up with her primary care provider regarding PT referral recommendation   Patient Self Care Activities:  Attend all scheduled provider appointments Call pharmacy for medication refills 3-7 days in advance of running out of medications Call provider office for new concerns or questions  Take medications as prescribed   Call 911 for severe symptoms. Keep follow up appointments with your providers Drink plenty of fluids, keep vaginal area clean and wipe front to back Use ambulatory device as recommended by provider Use a heating pad to buttock/ leg area to help relax muscles. Contact your primary care provider office or respond through Mychart regarding PT referral  recommendation   Plan:  Telephone follow up appointment with care management team member scheduled for:  11/03/23 at 3pm The patient has been provided with contact information for the care management team and has been advised to call with any health related questions or concerns.           Recommendation:   PCP Follow-up Continue Current Plan of Care Follow up with primary care provider regarding PT referral recommendation  Follow Up Plan:   Telephone follow-up in 1 week8/13/25 at 3 pm  Arvin Seip RN, BSN, CCM [place]  [facility], Population Health Case Manager Phone: [phone]

## 2023-10-27 NOTE — Patient Instructions (Signed)
 Visit Information  Thank you for taking time to visit with me today. Please don't hesitate to contact me if I can be of assistance to you before our next scheduled telephone appointment.  Our next appointment is by telephone on 11/11/23 at 2 pm  Following is a copy of your care plan:   Goals Addressed             This Visit's Progress    VBCI Transitions of Care (TOC) Care Plan       Problems:  Recent Hospitalization for treatment of urinary tract infection Knowledge Deficit Related to urinary tract infection management and concerns related to memory  Goal:  Over the next 30 days, the patient will not experience hospital readmission  Interventions:  Transitions of Care: Doctor Visits  - discussed the importance of doctor visits- Reviewed upcoming provider appointments and confirmed with patient that she has them documented on her personal calendar. Medications reviewed and compliance discussed.  Reminded patient to pick up her prescribed sertraline  medication at pharmacy.  Reminded patient of upcoming social worker appointment date/ time.  Assessed buttock/ leg pain Assessed for ongoing UTI symptoms. Advised to drink plenty of water, keep vaginal area clean and wipe front to back.  Advised to report any new or ongoing symptoms to her primary care provider.  Advised to keep follow up appointments with provider Call primary care provider for new or ongoing symptoms Discussed fall prevention due to patient using walker on occasion for ambulation and due to ongoing leg pain Advised to consider using a heating pad to buttock/ leg area to help relax muscles.  Advised to follow up with her primary care provider regarding PT referral recommendation   Patient Self Care Activities:  Attend all scheduled provider appointments Call pharmacy for medication refills 3-7 days in advance of running out of medications Call provider office for new concerns or questions  Take medications as  prescribed   Call 911 for severe symptoms. Keep follow up appointments with your providers Drink plenty of fluids, keep vaginal area clean and wipe front to back Use ambulatory device as recommended by provider Use a heating pad to buttock/ leg area to help relax muscles. Contact your primary care provider office or respond through Mychart regarding PT referral recommendation   Plan:  Telephone follow up appointment with care management team member scheduled for:  11/03/23 at 3pm The patient has been provided with contact information for the care management team and has been advised to call with any health related questions or concerns.         Patient verbalizes understanding of instructions and care plan provided today and agrees to view in MyChart. Active MyChart status and patient understanding of how to access instructions and care plan via MyChart confirmed with patient.     The patient has been provided with contact information for the care management team and has been advised to call with any health related questions or concerns.   Please call the care guide team at [phone] if you need to cancel or reschedule your appointment.   Please call the Suicide and Crisis Lifeline: 988 call 1-800-273-TALK (toll free, 24 hour hotline) if you are experiencing a Mental Health or Behavioral Health Crisis or need someone to talk to.  Arvin Seip RN, BSN, CCM [employer], Population Health Case Manager Phone: [phone]

## 2023-11-03 ENCOUNTER — Telehealth: Payer: Self-pay | Admitting: Family Medicine

## 2023-11-03 ENCOUNTER — Other Ambulatory Visit: Payer: Self-pay

## 2023-11-03 NOTE — Telephone Encounter (Signed)
 Received this message:  Message Received: Today Green, Davina E, RN  Emmanuel Ercole, Laine LABOR, MD Hi Dr. Randeen, I spoke with your patient today for her 3rd TOC weekly call. She continues to report ongoing right back and leg pain. She had a follow up visit with your on 10/25/23 and a PT referral was put in for her.  She states she has not heard from the home health agency.  When I reviewed the referral in EPIC it is still showing as pending so I'm not sure if it was actually picked up by an agency and I didn't see an agency name to call.  Can you please have your CMA follow up on the referral.  Thank you and please let me know if I can be of further assistance.  Davina Green RN, BSN, CCM [place]  [facility], Population Health Case Manager Phone: [phone]   The referral I did was for outpatient PT , not home health PT  If she is unable to do it outpt let me know  Please touch base with referrals as to status of referral also  Thanks

## 2023-11-03 NOTE — Patient Instructions (Signed)
 Visit Information  Thank you for taking time to visit with me today. Please don't hesitate to contact me if I can be of assistance to you before our next scheduled telephone appointment.  Our next appointment is by telephone on 11/12/23 at 2 pm  Following is a copy of your care plan:   Goals Addressed             This Visit's Progress    VBCI Transitions of Care (TOC) Care Plan       Problems:  Recent Hospitalization for treatment of urinary tract infection Knowledge Deficit Related to urinary tract infection management and concerns related to memory  Goal:  Over the next 30 days, the patient will not experience hospital readmission  Interventions:  Transitions of Care: Doctor Visits  - discussed the importance of doctor visits- Reviewed upcoming provider appointments and confirmed with patient that she has them documented on her personal calendar. Medications reviewed and compliance discussed. Patient confirmed having all of her medications Reminded patient of upcoming social worker appointment date/ time.  Assessed for ongoing buttock/ leg pain Assessed for ongoing UTI symptoms. Advised to drink plenty of water, keep vaginal area clean and wipe front to back.  Advised to report any new or ongoing symptoms to her primary care provider.  Advised to keep follow up appointments with provider Call primary care provider for new or ongoing symptoms Discussed fall prevention due to patient using walker on occasion for ambulation and due to ongoing leg pain Advised to consider using a heating pad to buttock/ leg area to help relax muscles.  Message sent to primary care provider informing her that patients PT referral shows as pending in her chart.    Patient Self Care Activities:  Attend all scheduled provider appointments Call pharmacy for medication refills 3-7 days in advance of running out of medications Call provider office for new concerns or questions  Take medications as  prescribed   Call 911 for severe symptoms. Keep follow up appointments with your providers Drink plenty of fluids, keep vaginal area clean and wipe front to back Use ambulatory device as recommended by provider Use a heating pad to buttock/ leg area to help relax muscles.   Plan:  Telephone follow up appointment with care management team member scheduled for:  11/12/23 at 2 pm        Patient verbalizes understanding of instructions and care plan provided today and agrees to view in MyChart. Active MyChart status and patient understanding of how to access instructions and care plan via MyChart confirmed with patient.     The patient has been provided with contact information for the care management team and has been advised to call with any health related questions or concerns.   Please call the care guide team at [phone] if you need to cancel or reschedule your appointment.   Please call the Suicide and Crisis Lifeline: 988 call 1-800-273-TALK (toll free, 24 hour hotline) if you are experiencing a Mental Health or Behavioral Health Crisis or need someone to talk to.  Arvin Seip RN, BSN, CCM [employer], Population Health Case Manager Phone: [phone]

## 2023-11-04 ENCOUNTER — Ambulatory Visit (INDEPENDENT_AMBULATORY_CARE_PROVIDER_SITE_OTHER)

## 2023-11-04 DIAGNOSIS — Z7901 Long term (current) use of anticoagulants: Secondary | ICD-10-CM | POA: Diagnosis not present

## 2023-11-04 LAB — POCT INR: INR: 2.1 (ref 2.0–3.0)

## 2023-11-04 NOTE — Telephone Encounter (Signed)
 Thank you :)

## 2023-11-04 NOTE — Patient Instructions (Addendum)
 Pre visit review using our clinic review tool, if applicable. No additional management support is needed unless otherwise documented below in the visit note.  Continue 1 tablet daily except take 1/2 tablet on Monday and Friday . Recheck in 1 weeks.

## 2023-11-04 NOTE — Progress Notes (Signed)
 Pt was prescribed sertraline . This has an interaction with warfarin and cause an increase in INR. Pt has not picked up medication yet. Advised her to pick up medication today and will recheck INR next week. Continue 1 tablet daily except take 1/2 tablet on Monday and Friday . Recheck in 1 weeks.

## 2023-11-04 NOTE — Telephone Encounter (Signed)
 Pt in coumadin  clinic this morning. Questioned pt if she started the new medication that PCP prescribed. She reported she was not aware of any new medication and the pharmacy never called to let her know she had a medication to pick up.   Reminded pt of the medication and what it was for. She said I need that. Advised it was sent to [facility] in [place]. Provided coumadin  clinic number again and advised to contact the clinic if any problems picking up the medication. Pt verbalized understanding.   This medication will interact with warfarin so pt was scheduled for 1 week f/u.

## 2023-11-05 ENCOUNTER — Ambulatory Visit: Admitting: Physician Assistant

## 2023-11-05 DIAGNOSIS — R3121 Asymptomatic microscopic hematuria: Secondary | ICD-10-CM

## 2023-11-05 NOTE — Telephone Encounter (Signed)
Called pt and no answer and pt's VM box is full 

## 2023-11-10 ENCOUNTER — Other Ambulatory Visit

## 2023-11-10 NOTE — Patient Instructions (Signed)
 Visit Information  Thank you for taking time to visit with me today.  Your transition of care goals have been met.  Please don't hesitate to contact me if I can be of assistance to you.  Following is a copy of your care plan:   Goals Addressed             This Visit's Progress    COMPLETED: VBCI Transitions of Care (TOC) Care Plan       Goals Met. Problems:  Recent Hospitalization for treatment of urinary tract infection Knowledge Deficit Related to urinary tract infection management and concerns related to memory  Goal:  Over the next 30 days, the patient will not experience hospital readmission  Interventions:  Transitions of Care: Doctor Visits  - discussed the importance of doctor visits- Reviewed upcoming provider appointments and confirmed with patient that she has them documented on her personal calendar. Medications reviewed and compliance discussed. Patient confirmed having all of her medications Reminded patient of upcoming social worker appointment date/ time.  Assessed for ongoing buttock/ leg pain- patient states this has resolved.  Assessed for ongoing UTI symptoms. Re- Advised to drink plenty of water, keep vaginal area clean and wipe front to back.  Advised to report any new or ongoing symptoms to her primary care provider.  Advised to keep follow up appointments with provider Call primary care provider for new or ongoing symptoms Re-discussed fall prevention due to patient using walker on occasion for ambulation and due to ongoing leg pain Discussed PT - patient states she doesn't feel PT is necessary at this time. She states she feels this would be more problems than its work. Discussed and offered ongoing case management follow up with CCM program - patient declined.     Patient Self Care Activities:  Attend all scheduled provider appointments Call pharmacy for medication refills 3-7 days in advance of running out of medications Call provider office for new  concerns or questions  Take medications as prescribed   Call 911 for severe symptoms. Keep follow up appointments with your providers Drink plenty of fluids, keep vaginal area clean and wipe front to back vaginally Continue to use ambulatory device as recommended by provider   Plan:  No further follow up required: patient has met her TOC goals.         Patient verbalizes understanding of instructions and care plan provided today and agrees to view in MyChart. Active MyChart status and patient understanding of how to access instructions and care plan via MyChart confirmed with patient.     No further follow up required: Transition of care goals have been met.   Please call the care guide team at [phone] if you need to cancel or reschedule your appointment.   Please call the Suicide and Crisis Lifeline: 988 call the [place]  National Suicide Prevention Lifeline: 4248124930 or TTY: 914 866 4128 TTY 513 516 4221) to talk to a trained counselor call 1-800-273-TALK (toll free, 24 hour hotline) if you are experiencing a Mental Health or Behavioral Health Crisis or need someone to talk to.  Arvin Seip RN, BSN, CCM [employer], Population Health Case Manager Phone: [phone]

## 2023-11-10 NOTE — Transitions of Care (Post Inpatient/ED Visit) (Signed)
 Transition of Care 5 week   Visit Note  11/10/2023  Name: [name] MRN: [id number]          DOB: March 18, 1933  Situation: Patient enrolled in [facility] 30-day program. Visit completed with patient by telephone.   Background:  Hospitalization 10/05/23 through 10/08/23 for Acute cystitis without hematuria.   Patient with ongoing bilateral leg/ buttock pain, depression like symptoms and memory concerns.      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 s/p PVI [name] 2010;  b. on tikosyn /coumadin ;  c. 05/2009 Echo: EF 60-65%, Gr 2 DD. (first dx 09/ 2007)   AV block 07/28/2019   Bilateral lower extremity edema    Bleeding hemorrhoid    Carotid stenosis    mild (hosp 3/11)- consult by vasc/ Dr Oris   Complication of anesthesia    hard to wake   Diverticulosis of colon    Dyspnea    on exertion-climbing stairs   Fatty liver    H/O cardiac radiofrequency ablation    01/ 2008 at [facility]  /  03/ 2010  at [facility]   Heart failure with preserved ejection fraction [facility])    History of adenomatous polyp of colon    tubular adenoma's   History of cardiomyopathy    secondary tachycardia-induced cardiomyopathy -- resolved 2014   History of colonic polyps 09/18/2009   Qualifier: Diagnosis of   By: Genie CMA LEODIS), Chick      IMO SNOMED Dx Update Oct 2024   Formatting of this note might be different from the original. Qualifier: Diagnosis of By: Kowalk CMA LEODIS), Chick     History of squamous cell carcinoma in situ (SCCIS) of skin    05/ 2017  nasal bridge and right medial knee   History of transient ischemic attack (TIA)    01-24-2005 and 06-12-2009   Hyperlipidemia    Hypothyroidism    Mild  intermittent asthma    reacts to cats   Mixed stress and urge urinary incontinence    Presence of heart assist device (HCC) 03/25/2023   Presence of permanent cardiac pacemaker    was put in 07/2019   Pulmonary nodule    S/P AV nodal ablation 07/28/19 07/29/2019   S/P mitral valve repair 10-23-1998  dr lansing at [address] [facility]   for MVP and regurg. 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    Assessment: Patient Reported Symptoms: Cognitive Cognitive Status: No symptoms reported, Alert and oriented to person, place, and time, Normal speech and language skills, Insightful and able to interpret abstract concepts, Struggling with memory recall      Neurological Neurological Review of Symptoms: No symptoms reported    HEENT HEENT Symptoms Reported: No symptoms reported      Cardiovascular Cardiovascular Symptoms Reported: No symptoms reported    Respiratory Respiratory Symptoms Reported: No symptoms reported    Endocrine Endocrine Symptoms Reported: No symptoms reported    Gastrointestinal Gastrointestinal Symptoms Reported: No symptoms reported      Genitourinary Genitourinary Symptoms Reported: No symptoms reported  Integumentary Integumentary Symptoms Reported: No symptoms reported    Musculoskeletal Musculoskelatal Symptoms Reviewed: No symptoms reported Other Musculoskeletal Symptoms: Patient reports pain in right buttocks area/ leg has resolved. Musculoskeletal Management Strategies: Routine screening, Medical device      Psychosocial Psychosocial Symptoms Reported: No symptoms reported         There were no vitals filed for this visit.  Medications Reviewed Today     Reviewed by Curtisha Bendix E, RN (Registered Nurse) on 11/10/23 at 1256  Med List Status: <None>   Medication Order Taking? Sig Documenting Provider Last Dose Status Informant  acetaminophen   (TYLENOL ) 325 MG tablet [id number] Yes Take 650 mg by mouth 2 (two) times daily as needed for moderate pain (pain score 4-6), fever or headache. [provider]  Active Self, Pharmacy Records  albuterol  (VENTOLIN  HFA) 108 (90 Base) MCG/ACT inhaler [id number] Yes Inhale 1-2 puffs into the lungs every 6 (six) hours as needed for wheezing or shortness of breath. Tower, Laine LABOR, MD  Active Self, Pharmacy Records  bisoprolol  (ZEBETA ) 5 MG tablet [id number] Yes Take 1 tablet (5 mg total) by mouth daily. Gardenia Led, DO  Active Self, Pharmacy Records  famotidine  (PEPCID ) 20 MG tablet [id number] Yes Take 1 tablet (20 mg total) by mouth 2 (two) times daily. Tower, Laine LABOR, MD  Active Self, Pharmacy Records  ferrous sulfate  325 [phone] FE) MG EC tablet [id number] Yes Take 1 tablet (325 mg total) by mouth daily with breakfast. Tower, Laine LABOR, MD  Active Self, Pharmacy Records  fluticasone  (FLONASE ) 50 MCG/ACT nasal spray [id number] Yes Place 1 spray into both nostrils 2 (two) times daily as needed for allergies or rhinitis. Tower, Laine LABOR, MD  Active Self, Pharmacy Records  furosemide  (LASIX ) 40 MG tablet [id number] Yes Take 1 tablet (40 mg total) by mouth daily. Gardenia Led, DO  Active Self, Pharmacy Records  JARDIANCE  10 MG TABS tablet [id number] Yes TAKE 1 TABLET BY MOUTH DAILY BEFORE BREAKFAST. Gardenia Led, DO  Active Self, Pharmacy Records  levothyroxine  (SYNTHROID ) 75 MCG tablet [id number] Yes TAKE 1 TABLET EVERY DAY BEFORE BREAKFAST Tower, Laine LABOR, MD  Active Self, Pharmacy Records  losartan  (COZAAR ) 25 MG tablet [id number] Yes TAKE 1 TABLET (25 MG TOTAL) BY MOUTH DAILY. Gardenia Led, DO  Active Self, Pharmacy Records  mexiletine (MEXITIL ) 200 MG capsule [id number] Yes TAKE 1 CAPSULE BY MOUTH TWICE A DAY Fernande Elspeth BROCKS, MD  Active Self, Pharmacy Records  sertraline  (ZOLOFT ) 25 MG tablet [id number] Yes Take 1 tablet (25 mg total) by mouth daily. In evening Tower, Laine LABOR, MD  Active    spironolactone  (ALDACTONE ) 25 MG tablet [id number] Yes Take 0.5 tablets (12.5 mg total) by mouth daily. Fernande Elspeth BROCKS, MD  Active Self, Pharmacy Records  UNABLE TO FIND [id number] Yes Take 1 tablet by mouth daily. Unknown OTC allergy medication, red tablet (not pseudoephedrine). [provider]  Active Self, Pharmacy Records  warfarin (COUMADIN ) 5 MG tablet [id number] Yes Take 0.5-1 tablets (2.5-5 mg total) by mouth See admin instructions. Take 1/2 tablet (2.5mg ) by mouth once daily on Monday, Wednesday, Friday and take 1 tablet (5mg ) once daily on all other days. Laurence Locus, DO  Active             Goals Addressed             This Visit's Progress    COMPLETED: [facility] Transitions of Care (TOC) Care Plan       Goals Met. Problems:  Recent Hospitalization for  treatment of urinary tract infection Knowledge Deficit Related to urinary tract infection management and concerns related to memory  Goal:  Over the next 30 days, the patient will not experience hospital readmission  Interventions:  Transitions of Care: Doctor Visits  - discussed the importance of doctor visits- Reviewed upcoming provider appointments and confirmed with patient that she has them documented on her personal calendar. Medications reviewed and compliance discussed. Patient confirmed having all of her medications Reminded patient of upcoming social worker appointment date/ time.  Assessed for ongoing buttock/ leg pain- patient states this has resolved.  Assessed for ongoing UTI symptoms. Re- Advised to drink plenty of water, keep vaginal area clean and wipe front to back.  Advised to report any new or ongoing symptoms to her primary care provider.  Advised to keep follow up appointments with provider Call primary care provider for new or ongoing symptoms Re-discussed fall prevention due to patient using walker on occasion for ambulation and due to ongoing leg pain Discussed PT - patient states she doesn't  feel PT is necessary at this time. She states she feels this would be more problems than its work. Discussed and offered ongoing case management follow up with CCM program - patient declined.     Patient Self Care Activities:  Attend all scheduled provider appointments Call pharmacy for medication refills 3-7 days in advance of running out of medications Call provider office for new concerns or questions  Take medications as prescribed   Call 911 for severe symptoms. Keep follow up appointments with your providers Drink plenty of fluids, keep vaginal area clean and wipe front to back vaginally Continue to use ambulatory device as recommended by provider   Plan:  No further follow up required: patient has met her TOC goals.        Patient states she is doing very well.  She states she is managing her care at this time and doesn't feel she needs any additional follow up from a nurse.  Patient states she has decided with her family to sell her home and move closer to her children in [place], [place].    Recommendation:   Continue Current Plan of Care  Follow Up Plan:   Closing From:  Transitions of Care Program Goals met.   Arvin Seip RN, BSN, CCM [employer], Population Health Case Manager Phone: [phone]

## 2023-11-11 ENCOUNTER — Other Ambulatory Visit: Payer: Self-pay | Admitting: *Deleted

## 2023-11-11 ENCOUNTER — Ambulatory Visit (INDEPENDENT_AMBULATORY_CARE_PROVIDER_SITE_OTHER)

## 2023-11-11 DIAGNOSIS — Z7901 Long term (current) use of anticoagulants: Secondary | ICD-10-CM

## 2023-11-11 LAB — POCT INR: INR: 3.2 — AB (ref 2.0–3.0)

## 2023-11-11 NOTE — Progress Notes (Addendum)
 Pt was prescribed sertraline . This has an interaction with warfarin and cause an increase in INR. Pt was to pick up script last week. Pt reports she went to the pharmacy last week and they said they didn't have any scripts for her. Advised pt this nurse will f/u with pharmacy and then contact her. Pt appreciative and verbalized understanding.  Contacted [facility], [place], and they report pt picked up sertraline  on 8/6. Pt does not remember picking up this medication. Pt is having more decline in memory and having difficulty remembering if she is taking her medication.  Pt already took warfarin today. Hold warfarin tomorrow and then continue 1 tablet daily except take 1/2 tablet on Monday and Friday . Recheck in 1 weeks.

## 2023-11-11 NOTE — Telephone Encounter (Signed)
 Referral dpt placed a letter regarding her PT in her mychart, I printed out the letter and also mailed it to pt

## 2023-11-11 NOTE — Progress Notes (Signed)
 Contacted pt and addressed all of her medication.   Pt read each bottle of medication and how she was taking it. She does use a pill box. She has been taking sertraline . All medications were verified on her medication list EXCEPT levothyroxine . Pt could not find any of this med or any empty bottles of it. Last script was sent in by PCP on 12/29/22, #90, 1RF. That would have lasted the pt until April of 2025. Last TSH lab was performed on 7/29 and was normal          Component Ref Range & Units (hover) 3 wk ago (10/19/23) 7 mo ago (03/25/23) 1 yr ago (07/20/22) 1 yr ago (07/03/22) 1 yr ago (03/12/22) 1 yr ago (01/01/22) 2 yr ago (10/08/21)  TSH 4.14 3.69 3.161 R, CM 3.24 4.94       Made f/u apt with PCP for next week when she has coumadin  clinic apt.  Gave pt phone number to schedule PT.  Advised pt if any changes or questions to feel free to contact the coumadin  clinic. Pt was appreciative and verbalized understanding.   Review of chart to determine why pt apixaban  was stopped and warfarin started. Cardiology telephone encounter on 04/14/19 reports pt called and explained she was having bleeding after a hemorrhoidectomy and refused to restart. She asked to start warfarin at that time.

## 2023-11-11 NOTE — Patient Outreach (Signed)
 Phone call to patient for scheduled call. Patient had company at the time of the call and requested that the appointment be re-scheduled. Appointment rescheduled for 11/29/23 1:00pm   Oumar Marcott, LCSW [place]  [facility], [facility] Licensed Clinical Social Worker  Direct Dial: [phone]

## 2023-11-11 NOTE — Patient Instructions (Addendum)
 Pre visit review using our clinic review tool, if applicable. No additional management support is needed unless otherwise documented below in the visit note.  Hold warfarin tomorrow and then continue 1 tablet daily except take 1/2 tablet on Monday and Friday . Recheck in 1 weeks.

## 2023-11-12 ENCOUNTER — Telehealth

## 2023-11-15 NOTE — Therapy (Signed)
 OUTPATIENT PHYSICAL THERAPY NEURO EVALUATION   Patient Name: [name] MRN: [id number] DOB:[birth year], 89 y.o., female Today's Date: 11/16/2023   PCP: Randeen Laine LABOR, MD  REFERRING PROVIDER: Randeen Laine LABOR, MD   END OF SESSION:   PT End of Session - 11/16/23 1101     Visit Number 1    Number of Visits 10    Date for PT Re-Evaluation 12/28/23    PT Start Time 1101    PT Stop Time 1149    PT Time Calculation (min) 48 min    Equipment Utilized During Treatment Gait belt    Activity Tolerance Patient tolerated treatment well    Behavior During Therapy WFL for tasks assessed/performed          Past Medical History:  Diagnosis Date   ADENOMATOUS COLONIC POLYP 11/04/2006   Qualifier: Diagnosis of   By: Lewellyn CMA (AAMA), Amanda         Allergic rhinitis    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 s/p PVI [name] 2010;  b. on tikosyn /coumadin ;  c. 05/2009 Echo: EF 60-65%, Gr 2 DD. (first dx 09/ 2007)   AV block 07/28/2019   Bilateral lower extremity edema    Bleeding hemorrhoid    Carotid stenosis    mild (hosp 3/11)- consult by vasc/ Dr Oris   Complication of anesthesia    hard to wake   Diverticulosis of colon    Dyspnea    on exertion-climbing stairs   Fatty liver    H/O cardiac radiofrequency ablation    01/ 2008 at [facility] [place]  /  03/ 2010  at [facility]   Heart failure with preserved ejection fraction [facility])    History of adenomatous polyp of colon    tubular adenoma's   History of cardiomyopathy    secondary tachycardia-induced cardiomyopathy -- resolved 2014   History of colonic polyps 09/18/2009   Qualifier: Diagnosis of   By: Genie CMA LEODIS), Chick      IMO SNOMED Dx Update Oct 2024   Formatting of this note might be different from the original. Qualifier: Diagnosis of By: Kowalk CMA LEODIS), Chick     History of squamous cell  carcinoma in situ (SCCIS) of skin    05/ 2017  nasal bridge and right medial knee   History of transient ischemic attack (TIA)    01-24-2005 and 06-12-2009   Hyperlipidemia    Hypothyroidism    Mild intermittent asthma    reacts to cats   Mixed stress and urge urinary incontinence    Presence of heart assist device (HCC) 03/25/2023   Presence of permanent cardiac pacemaker    was put in 07/2019   Pulmonary nodule    S/P AV nodal ablation 07/28/19 07/29/2019   S/P mitral valve repair 10-23-1998  dr lansing at [facility]   for MVP and regurg. 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   Past Surgical History:  Procedure Laterality Date   APPENDECTOMY  1978   AV NODE ABLATION N/A 07/28/2019   Procedure: AV NODE ABLATION;  Surgeon: Fernande Elspeth BROCKS, MD;  Location: [facility] INVASIVE CV LAB;  Service: Cardiovascular;  Laterality: N/A;   BUBBLE STUDY  06/19/2019   Procedure: BUBBLE STUDY;  Surgeon: Mona,  Vinie BROCKS, MD;  Location: [facility] ENDOSCOPY;  Service: Cardiovascular;;   CARDIAC ELECTROPHYSIOLOGY MAPPING AND ABLATION  01/ 2008    at [facility] [place]    right-sided ablation atrial flutter   CARDIAC ELECTROPHYSIOLOGY STUDY AND ABLATION  03/ 2010   dr marland at [facility]   AV node ablation and pulmonary vein isolation for atrial fib   CARDIOVERSION  06-18-2006;  07-13-2006;  10-19-2010;  10-27-2010   CATARACT EXTRACTION W/PHACO Right 04/09/2021   Procedure: CATARACT EXTRACTION PHACO AND INTRAOCULAR LENS PLACEMENT (IOC) RIGHT 6.71 01:11.1;  Surgeon: Mittie Gaskin, MD;  Location: [facility] SURGERY CNTR;  Service: Ophthalmology;  Laterality: Right;   CATARACT EXTRACTION W/PHACO Left 04/23/2021   Procedure: CATARACT EXTRACTION PHACO AND INTRAOCULAR LENS PLACEMENT (IOC) LEFT;  Surgeon: Mittie Gaskin, MD;  Location: [facility] SURGERY CNTR;  Service: Ophthalmology;  Laterality: Left;   [place] 5.65 00:50.1   COLONOSCOPY     COLONOSCOPY WITH PROPOFOL  N/A 10/13/2017   Procedure: COLONOSCOPY WITH PROPOFOL ;  Surgeon: Therisa Bi, MD;  Location: [facility] ENDOSCOPY;  Service: Gastroenterology;  Laterality: N/A;   COLONOSCOPY WITH PROPOFOL  N/A 02/20/2020   Procedure: COLONOSCOPY WITH PROPOFOL ;  Surgeon: Maryruth Ole DASEN, MD;  Location: [facility] ENDOSCOPY;  Service: Endoscopy;  Laterality: N/A;   CYSTO/ TRANSURETHRAL COLLAGEN INJECTION THERAPY  07-26-2007   dr gaston   DILATION AND CURETTAGE OF UTERUS     ESOPHAGOGASTRODUODENOSCOPY (EGD) WITH PROPOFOL  N/A 10/13/2017   Procedure: ESOPHAGOGASTRODUODENOSCOPY (EGD) WITH PROPOFOL ;  Surgeon: Therisa Bi, MD;  Location: [facility] ENDOSCOPY;  Service: Gastroenterology;  Laterality: N/A;   EVALUATION UNDER ANESTHESIA WITH HEMORRHOIDECTOMY N/A 04/09/2020   Procedure: EXAM UNDER ANESTHESIA WITH HEMORRHOIDECTOMY;  Surgeon: Jordis Laneta FALCON, MD;  Location: [facility] ORS;  Service: General;  Laterality: N/A;   EXCISIONAL HEMORRHOIDECTOMY  1980s   GIVENS CAPSULE STUDY N/A 12/08/2017   Procedure: GIVENS CAPSULE STUDY;  Surgeon: Therisa Bi, MD;  Location: [facility] ENDOSCOPY;  Service: Gastroenterology;  Laterality: N/A;   HEMORRHOID SURGERY N/A 10/29/2016   Procedure: HEMORRHOIDECTOMY;  Surgeon: Debby Hila, MD;  Location: [facility];  Service: General;  Laterality: N/A;   MITRAL VALVE ANNULOPLASTY  10/23/1998   Model [id number]; [place] [id number]; size 32mm; [facility]; Dr. Lansing   PACEMAKER IMPLANT N/A 07/28/2019   Procedure: PACEMAKER IMPLANT;  Surgeon: Fernande Elspeth BROCKS, MD;  Location: [facility] INVASIVE CV LAB;  Service: Cardiovascular;  Laterality: N/A;   PILONIDAL CYST EXCISION  1954   RIGHT/LEFT HEART CATH AND CORONARY ANGIOGRAPHY Bilateral 09/21/2022   Procedure: RIGHT/LEFT HEART CATH AND CORONARY ANGIOGRAPHY;  Surgeon: Darron Deatrice LABOR, MD;  Location: [facility] INVASIVE CV LAB;  Service: Cardiovascular;  Laterality: Bilateral;   TEE WITH CARDIOVERSION   05-06-2006 at [facility];  01-02-2013 at [facility]   TEE WITHOUT CARDIOVERSION N/A 06/19/2019   Procedure: TRANSESOPHAGEAL ECHOCARDIOGRAM (TEE);  Surgeon: Mona Vinie BROCKS, MD;  Location: [facility] ENDOSCOPY;  Service: Cardiovascular;  Laterality: N/A;   TOTAL HIP ARTHROPLASTY Left 05/04/2017   Procedure: LEFT TOTAL HIP ARTHROPLASTY ANTERIOR APPROACH;  Surgeon: Vernetta Lonni GRADE, MD;  Location: [facility] OR;  Service: Orthopedics;  Laterality: Left;   TRANSTHORACIC ECHOCARDIOGRAM  05-01-2015   dr fernande   ef 50-55%/  mild AV sclerosis without stenosis/  post MV repair with mild central MR (valve area by pressure half-time 2cm^2,  valve area by continutity equation 0.91cm^2, peak grandiant 5mmHg)/  severe LAE/ mild TR/ mild RAE    TUBAL LIGATION Bilateral 1978   Patient Active Problem List   Diagnosis Date Noted   DDD (degenerative disc disease), lumbar  10/27/2023   Leg pain, bilateral 10/25/2023   Muscle cramps 10/19/2023   Decreased GFR 10/19/2023   Memory change 10/18/2023   Nonrheumatic mitral valve regurgitation 09/21/2022   Other fatigue 10/08/2021   Salt craving 06/25/2021   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/20/2020   Hearing loss 06/13/2020   S/P placement of cardiac pacemaker MDT 07/28/19 07/29/2019   History of CVA (cerebrovascular accident) 06/16/2019   Facial tingling 11/29/2018   Tremor of left hand 11/29/2018   Iron  deficiency anemia 10/21/2017   Constipation 08/02/2017   Numbness in feet 07/14/2017   Hip osteoarthritis 04/27/2017   Long term (current) use of anticoagulants - on coumadin  for afib 03/04/2017   Venous stasis dermatitis of both lower extremities 01/08/2017   Osteopenia 10/25/2016   Pedal edema 08/26/2016   Varicose veins of both lower extremities 08/26/2016   Estrogen deficiency 08/26/2016   Hemorrhoids 08/26/2016   History of nonmelanoma skin cancer 01/01/2016   Pruritus 10/04/2015   Hip pain 08/02/2014   Left knee pain 08/02/2014   Chronic cough 05/08/2014   Fatigue 08/16/2013    Left ovarian cyst 03/14/2013   (HFpEF) heart failure with preserved ejection fraction (HCC) 12/27/2012   PULMONARY NODULE 12/20/2008   Ganglion 10/04/2007   Mixed incontinence 04/28/2007   Hyperlipidemia 04/27/2007   Depression with anxiety 04/27/2007   Asthma, mild intermittent 04/27/2007   Insomnia 04/27/2007   Acquired hypothyroidism 09/02/2006   Atrial fibrillation, chronic (HCC) 08/05/2006    ONSET DATE: 10/19/2023  REFERRING DIAG:  R25.2 (ICD-10-CM) - Muscle cramps  M51.369 (ICD-10-CM) - Degeneration of intervertebral disc of lumbar region, unspecified whether pain present    THERAPY DIAG:  Generalized weakness  Unsteadiness on feet  Pain in right hip  Other low back pain  Rationale for Evaluation and Treatment: Rehabilitation  SUBJECTIVE:                                                                                                                                                                                             SUBJECTIVE STATEMENT:  Pt states I don't really know what I'm getting rehab for, I'm not stumbling around. Pt states she lives at home, independently, by herself with no concerns.  Pt states I got up one morning and the muscles in the back of her legs locked up and it was very painful from her knees down. Pt states it went all the way up to her hips. Pt states she hasn't had that same sensation/episode since then and it is not present now. Pt states her R hip every once in a while will catch but it isn't debilitating. Pt states  I carry this cane just in case she has imbalance. Pt states other than that, I'm in pretty good shape. Pt states she doesn't have shortness of breath. Pt states she does have a walker that she uses in her house when going up and down the long halls because it is ~39ft long. Pt states when she goes up/down the hall multiple times she does get tired. Pt states every once in a while she will get a catch in her back  where she begins to feel it, but she isn't cramping.  Pt states if she walks without the walker then her hip will start having pain, which is why she purchased it and started using it. Patient states within 10 minutes of activity she will start to have a little achy pain.  Pt states she is planning to purchase a smaller home in the near future where she will also be closer to her son.  Pt states she isn't interested in doing PT 2x/week. Pt states she gets heart palpitations with increased activity and that she is in advanced stage heart failure. Pt states she doesn't want to do anything that could cause her to have a stroke. Pt states it is more important to her that she keep her mind together more than her independence with mobility.    Pt accompanied by: self  PERTINENT HISTORY: PMH: Anemia, Arthritis, A-fib, Hyperlipidemia, Hypothyroidism, Pacemaker, Nonrheumatic mitral valve regurgitation, Hip OA, per MD note CT head chronic R MCA and PCA territory infarcts and mild CHF signs on cxr  From MD note on 10/25/2023:  Pt has bilateral posterior thigh leg cramps upon rising and starting movement Feels like muscle cramps Overall improved from last visit but still present No source found in labs-reviewed today  Reassuring exam  May benefit from stretching or PT LS film ordered-pending result  (see chart for full details)  PAIN:  Are you having pain? No  PRECAUTIONS: Fall and ICD/Pacemaker  RED FLAGS: None   WEIGHT BEARING RESTRICTIONS: No  FALLS: Has patient fallen in last 6 months? No and no near falls  LIVING ENVIRONMENT: Lives with: lives alone Lives in: House/apartment - states she is planning to move closer to her son in [place]: No Has following equipment at home: Single point cane and Environmental consultant - 2 wheeled  PLOF: Independent, Independent with basic ADLs, Independent with household mobility with device, and Independent with community mobility with  device  PATIENT GOALS: patient does not feel physical therapy is needed at this time, but her goal is to remain as independent as possible both mentally and physically  OBJECTIVE:  Note: Objective measures were completed at Evaluation unless otherwise noted.  DIAGNOSTIC FINDINGS:  CLINICAL DATA:  Bilateral buttock and leg pain.   EXAM: LUMBAR SPINE - 2-3 VIEW   COMPARISON:  None Available.   FINDINGS: Very mild dextroconvex curvature. Minimal grade 1 anterolisthesis L3 on L4 and L4 on L5. Alignment is otherwise anatomic. Vertebral body and disc space heights are maintained. Mild endplate degenerative changes in the mid and lower lumbar spine with facet hypertrophy.   IMPRESSION: 1. Mid and lower lumbar degenerative disc disease and facet hypertrophy. 2. Minimal grade 1 anterolisthesis of L3 on L4 and L4 on L5.     Electronically Signed   By: Newell Eke M.D.   On: 10/25/2023 16:08  COGNITION: Overall cognitive status: No family/caregiver present to determine baseline cognitive functioning; however, patient reporting same story at least ~3-4 times during session and some  inconsistencies noted in her subjective report indicating potential cognitive impairments  Patient reports she uses a pill box to manage her medications. Pt reports she is very regimented in ensuring she has things in place at home.   SENSATION: WFL on light touch screen  COORDINATION: Not formally assessed  EDEMA:  Not formally assessed, but minor swelling noted in B LE lower legs during MMT testing  MUSCLE TONE: Not formally assessed  MUSCLE LENGTH: Not formally assessed  DTRs:  Not formally assessed  POSTURE: rounded shoulders and forward head  LOWER EXTREMITY ROM:     Active   WFL for tasks assessed Right Eval Left Eval  Hip flexion    Hip extension    Hip abduction    Hip adduction    Hip internal rotation    Hip external rotation    Knee flexion    Knee extension     Ankle dorsiflexion    Ankle plantarflexion    Ankle inversion    Ankle eversion     (Blank rows = not tested)  LOWER EXTREMITY MMT:    MMT Right Eval Left Eval  Hip flexion 3+ with pain in anterior hip 4-  Hip extension    Hip abduction 4- 3+  Hip adduction 4 4  Hip internal rotation    Hip external rotation    Knee flexion 4, pt reports it as more wobbly 4  Knee extension 4 4+  Ankle dorsiflexion 4 4  Ankle plantarflexion 4 4  Ankle inversion    Ankle eversion    (Blank rows = not tested)  Manual Muscle Test Scale 0/5 = No muscle contraction can be seen or felt 1/5 = Contraction can be felt, but there is no motion 2-/5 = Part moves through incomplete ROM w/ gravity decreased 2/5 = Part moves through complete ROM w/ gravity decreased 2+/5 = Part moves through incomplete ROM (<50%) against gravity or through complete ROM w/ gravity 3-/5 = Part moves through incomplete ROM (>50%) against gravity 3/5 = Part moves through complete ROM against gravity 3+/5 = Part moves through complete ROM against gravity/slight resistance 4-/5= Holds test position against slight to moderate pressure 4/5 = Part moves through complete ROM against gravity/moderate resistance 4+/5= Holds test position against moderate to strong pressure 5/5 = Part moves through complete ROM against gravity/full resistance  BED MOBILITY:  Not tested  TRANSFERS: Sit to stand: Complete Independence and Modified independence  Assistive device utilized: Single point cane and None     Stand to sit: Complete Independence and Modified independence  Assistive device utilized: Single point cane and None     Chair to chair: Complete Independence and Modified independence  Assistive device utilized: Single point cane and None       RAMP:  Not tested  CURB:  Not tested  STAIRS: Not tested GAIT: Findings: Gait Characteristics: WFL and step through pattern, Distance walked: ~170ft, Assistive device utilized:None,  Level of assistance: Complete Independence and Modified independence, and Comments: gait into clinic using SPC and gait in clinic without AD  FUNCTIONAL TESTS:  Functional gait assessment: need to assess  10 Meter Walk Test: Patient instructed to walk 10 meters (32.8 ft) as quickly and as safely as possible at their normal speed x2 and at a fast speed x2. Time measured from 2 meter mark to 8 meter mark to accommodate ramp-up and ramp-down.  Normal speed 1: 0.90 m/s (11 seconds) Normal speed 2: 0.947 m/s (10.55 seconds) Average Normal speed: 0.923  m/s, independently, no AD Fast speed 1: 1.18 m/s (8.45 seconds) Fast speed 2: 1.17 m/s (8.50 seconds) Average Fast speed: 1.175 m/s, independently, no AD - pt reports she is starting to feel it in her calf muscles, but isn't cramping Cut off scores: <0.4 m/s = household Ambulator, 0.4-0.8 m/s = limited community Ambulator, >0.8 m/s = community Ambulator, >1.2 m/s = crossing a street, <1.0 = increased fall risk MCID 0.05 m/s (small), 0.13 m/s (moderate), 0.06 m/s (significant)  (ANPTA Core Set of Outcome Measures for Adults with Neurologic Conditions, 2018)   Five times Sit to Stand Test (FTSS) "Stand up and sit down as quickly as possible 5 times, keeping your arms folded across your chest."    TIME: 16.3 seconds, no UE support  Times > 13.6 seconds is associated with increased disability and morbidity (Guralnik, 2000) Times > 15 seconds is predictive of recurrent falls in healthy individuals aged 66 and older (Buatois, et al., 2008) Normal performance values in community dwelling individuals aged 38 and older (Bohannon, 2006): 60-69 years: 11.4 seconds 70-79 years: 12.6 seconds 80-89 years: 14.8 seconds  MCID: >= 2.3 seconds for Vestibular Disorders (Meretta, 2006)   PATIENT SURVEYS:  ABC scale: The Activities-Specific Balance Confidence (ABC) Scale 0% 10 20 30  40 50 60 70 80 90 100% No confidence<->completely confident  "How  confident are you that you will not lose your balance or become unsteady when you . . .   Date tested 11/16/2023  Walk around the house 100%  2. Walk up or down stairs 80%  3. Bend over and pick up a slipper from in front of a closet floor 100%  4. Reach for a small can off a shelf at eye level 100%  5. Stand on tip toes and reach for something above your head 100%  6. Stand on a chair and reach for something 80%  7. Sweep the floor 75%  8. Walk outside the house to a car parked in the driveway 100%  9. Get into or out of a car 100%  10. Walk across a parking lot to the mall 80%  11. Walk up or down a ramp 50% - patient states mostly this is challenging due to impaired cardiac endurance from heart failure  12. Walk in a crowded mall where people rapidly walk past you 70%   13. Are bumped into by people as you walk through the mall 100%  14. Step onto or off of an escalator while you are holding onto the railing 100%  15. Step onto or off an escalator while holding onto parcels such that you cannot hold onto the railing 80%  16. Walk outside on icy sidewalks 50%  Total: #/16 85.31%                                                                                                                                 TREATMENT DATE: 11/16/2023  Unless  otherwise stated, CGA was provided and gait belt donned in order to ensure pt safety throughout session.  Patient reports she is getting a little bit of tingling and numbness along bilateral cheek bones and up near temporal lobes following . Pt states at home she hasn't ever been paying attention to know if she gets this symptom, but because we are doing a test she is noticing it. Pt states it isn't one of those things you woud notice if you weren't looking for something to be different. Pt correlates this symptom to walking fast during .   Patient agreeable to returning for at least 1 more visit and developing an HEP at that visit based  on therapist's recommendation to address deficits that are causing patient to require use of RW to ambulate her household distances without pain.   PATIENT EDUCATION: Education details: Findings from assessment, therapy POC, benefits of physical therapy Person educated: Patient Education method: Explanation Education comprehension: verbalized understanding and needs further education  HOME EXERCISE PROGRAM: Need to provide at next visit  GOALS: Goals reviewed with patient? Yes  SHORT TERM GOALS: Target date: 12/07/2023   Patient will be independent with home exercise program to improve strength/mobility for increased functional independence with ADLs and mobility.  Baseline: need to initiate Goal status: INITIAL   LONG TERM GOALS: Target date: 12/28/2023   Patient (> 36 years old) will complete five times sit to stand test (5XSTS) in < 15 seconds indicating an increased LE strength and improved balance.  Baseline: 16,3seconds without UE support Goal status: INITIAL  2.  Patient will increase 10 meter walk test to >1.19m/s as to improve gait speed for better community ambulation and to reduce fall risk.  Baseline: 0.923 m/s, independently, no AD Goal status: INITIAL  3.  Patient will increase Functional Gait Assessment (FGA) score to >20/30 as to reduce fall risk and improve dynamic gait safety with community ambulation.  Baseline: need to assess Goal status: INITIAL   ASSESSMENT:  CLINICAL IMPRESSION: Patient is an 89 y.o. female who was seen today for physical therapy evaluation and treatment for an episode of muscle cramps in bilateral lower legs; however, patient reports not having any subsequent episodes of this since. Patient reports she doesn't feel she truly needs physical therapy at this time due to no specific mobility concerns; however, is agreeable to participate in evaluation and assessment of functional outcome measures to assess her fall risk. Patient demonstrates  minor deficits on both 5xSTS and indicating slightly decreased gait speed and slightly impaired functional strength in B LEs. Patient reports her main mobility impairment is the recent need to start using RW in the home due to low back and R hip pain with prolonged walking without AD support; however, patient reports she is the one who initiated her use of the AD and has been doing well with it since. Patient does express concern during therapy session of avoiding any potential mobility tasks that would cause her increased cardiac exertion due to concern for having heart failure and patient worried increased exertion will cause her to have a CVA. Patient in agreement to return for at least 1 additional physical therapy session to perform further dynamic gait assessment as well as help develop her an HEP to address potential core/hip strength deficits that are limiting her gait tolerance without AD support. The pt will benefit from further skilled PT to improve these deficits in order to increase QOL and ease/safety with ADLs.   OBJECTIVE IMPAIRMENTS:  decreased activity tolerance, decreased balance, decreased cognition, decreased endurance, decreased knowledge of condition, decreased knowledge of use of DME, decreased mobility, decreased strength, and pain.   ACTIVITY LIMITATIONS: carrying, lifting, bending, squatting, and locomotion level  PARTICIPATION LIMITATIONS: cleaning, laundry, and community activity  PERSONAL FACTORS: Age, Behavior pattern, Time since onset of injury/illness/exacerbation, and 3+ comorbidities:  Anemia, Arthritis, A-fib, Hyperlipidemia, Hypothyroidism, Pacemaker, Nonrheumatic mitral valve regurgitation, Hip OA, per MD note CT head chronic R MCA and PCA territory infarcts and mild CHF signs on cxr are also affecting patient's functional outcome.   REHAB POTENTIAL: Good  CLINICAL DECISION MAKING: Evolving/moderate complexity  EVALUATION COMPLEXITY: Moderate  PLAN:  PT  FREQUENCY: 1-2x/week  PT DURATION: 6 weeks  PLANNED INTERVENTIONS: [id number]- PT Re-evaluation, 97750- Physical Performance Testing, 97110-Therapeutic exercises, 97530- Therapeutic activity, [id number]- Neuromuscular re-education, 97535- Self Care, [id number]- Manual therapy, [id number]- Gait training, [phone]- Orthotic Initial, [phone]- Orthotic/Prosthetic subsequent, [phone]- Canalith repositioning, [phone]- Electrical stimulation (manual), [phone] (1-2 muscles), [phone] (3+ muscles)- Dry Needling, Patient/Family education, Balance training, Stair training, Taping, Joint mobilization, Spinal mobilization, Vestibular training, Cognitive remediation, DME instructions, Cryotherapy, Moist heat, and Biofeedback  PLAN FOR NEXT SESSION:  ABI (ankle brachial index) FGA May NOT want to do due to pt's concern for her heart Address R hip pain & initiate HEP for strengthening core and R hip musculature   Treyshawn Muldrew, PT, DPT, NCS, CSRS Physical Therapist - [facility]  [facility]  9:28 PM 11/16/23

## 2023-11-16 ENCOUNTER — Other Ambulatory Visit: Payer: Self-pay

## 2023-11-16 ENCOUNTER — Encounter: Payer: Self-pay | Admitting: Physical Therapy

## 2023-11-16 ENCOUNTER — Ambulatory Visit: Attending: Family Medicine | Admitting: Physical Therapy

## 2023-11-16 DIAGNOSIS — R252 Cramp and spasm: Secondary | ICD-10-CM | POA: Diagnosis not present

## 2023-11-16 DIAGNOSIS — R531 Weakness: Secondary | ICD-10-CM | POA: Insufficient documentation

## 2023-11-16 DIAGNOSIS — M5459 Other low back pain: Secondary | ICD-10-CM | POA: Insufficient documentation

## 2023-11-16 DIAGNOSIS — M51369 Other intervertebral disc degeneration, lumbar region without mention of lumbar back pain or lower extremity pain: Secondary | ICD-10-CM | POA: Diagnosis not present

## 2023-11-16 DIAGNOSIS — M25551 Pain in right hip: Secondary | ICD-10-CM | POA: Diagnosis not present

## 2023-11-16 DIAGNOSIS — R2681 Unsteadiness on feet: Secondary | ICD-10-CM | POA: Diagnosis not present

## 2023-11-17 DIAGNOSIS — D485 Neoplasm of uncertain behavior of skin: Secondary | ICD-10-CM | POA: Diagnosis not present

## 2023-11-17 DIAGNOSIS — L814 Other melanin hyperpigmentation: Secondary | ICD-10-CM | POA: Diagnosis not present

## 2023-11-17 DIAGNOSIS — I8392 Asymptomatic varicose veins of left lower extremity: Secondary | ICD-10-CM | POA: Diagnosis not present

## 2023-11-17 DIAGNOSIS — L820 Inflamed seborrheic keratosis: Secondary | ICD-10-CM | POA: Diagnosis not present

## 2023-11-17 DIAGNOSIS — L570 Actinic keratosis: Secondary | ICD-10-CM | POA: Diagnosis not present

## 2023-11-17 DIAGNOSIS — D1801 Hemangioma of skin and subcutaneous tissue: Secondary | ICD-10-CM | POA: Diagnosis not present

## 2023-11-17 DIAGNOSIS — Z85828 Personal history of other malignant neoplasm of skin: Secondary | ICD-10-CM | POA: Diagnosis not present

## 2023-11-17 DIAGNOSIS — D692 Other nonthrombocytopenic purpura: Secondary | ICD-10-CM | POA: Diagnosis not present

## 2023-11-17 DIAGNOSIS — I8391 Asymptomatic varicose veins of right lower extremity: Secondary | ICD-10-CM | POA: Diagnosis not present

## 2023-11-17 DIAGNOSIS — L821 Other seborrheic keratosis: Secondary | ICD-10-CM | POA: Diagnosis not present

## 2023-11-18 ENCOUNTER — Encounter: Payer: Self-pay | Admitting: Family Medicine

## 2023-11-18 ENCOUNTER — Ambulatory Visit (INDEPENDENT_AMBULATORY_CARE_PROVIDER_SITE_OTHER)

## 2023-11-18 ENCOUNTER — Telehealth: Payer: Self-pay

## 2023-11-18 ENCOUNTER — Other Ambulatory Visit: Payer: Self-pay | Admitting: Family Medicine

## 2023-11-18 ENCOUNTER — Ambulatory Visit: Admitting: Family Medicine

## 2023-11-18 VITALS — BP 116/64 | HR 75 | Temp 97.6°F | Ht 65.0 in | Wt 178.1 lb

## 2023-11-18 DIAGNOSIS — I4891 Unspecified atrial fibrillation: Secondary | ICD-10-CM

## 2023-11-18 DIAGNOSIS — Z79899 Other long term (current) drug therapy: Secondary | ICD-10-CM

## 2023-11-18 DIAGNOSIS — F418 Other specified anxiety disorders: Secondary | ICD-10-CM | POA: Diagnosis not present

## 2023-11-18 DIAGNOSIS — R251 Tremor, unspecified: Secondary | ICD-10-CM

## 2023-11-18 DIAGNOSIS — Z7901 Long term (current) use of anticoagulants: Secondary | ICD-10-CM | POA: Diagnosis not present

## 2023-11-18 DIAGNOSIS — R413 Other amnesia: Secondary | ICD-10-CM | POA: Diagnosis not present

## 2023-11-18 LAB — POCT INR: INR: 2.5 (ref 2.0–3.0)

## 2023-11-18 MED ORDER — APIXABAN 5 MG PO TABS: 5.0000 mg | ORAL_TABLET | Freq: Two times a day (BID) | ORAL | 5 refills | Status: AC

## 2023-11-18 NOTE — Progress Notes (Unsigned)
 Complex Care Management Note Care Guide Note  11/18/2023 Name: [name] MRN: [id number] DOB: 1932-12-21   Complex Care Management Outreach Attempts: An unsuccessful telephone outreach was attempted today to offer the patient information about available complex care management services.  Follow Up Plan:  Additional outreach attempts will be made to offer the patient complex care management information and services.   Encounter Outcome:  No Answer  [facility] [place] [facility], [facility] Guide  Direct Dial: [phone]  Fax [phone]

## 2023-11-18 NOTE — Assessment & Plan Note (Addendum)
 Taking sertraline  25 mg daily  Tolerating well/no complaints Mood does seem lighter today and less anxious   Reviewed stressors/ coping techniques/symptoms/ support sources/ tx options and side effects in detail today   No doubt mood impacts her memory but short term memory issues warrant further eval as well

## 2023-11-18 NOTE — Assessment & Plan Note (Signed)
 Short term memory continues to decline Treating anx/dep and mood is better  There has been worry about med compliance (pt states she is regimented with her pill box and times) Also ? As to whether she has picked up some medication   Today MMSE score of 26/30  Concentration seems good but short term memory is where she lacks  Good clock draw   On exam-pt does repeat her self and also somewhat tangential in speech   Will refer to neurology for further eval of memory loss  Also ref to pharm for help with med management /compliance    Pt has ultimate goal of leaving her large house and moving to [place] closer to family

## 2023-11-18 NOTE — Assessment & Plan Note (Signed)
 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

## 2023-11-18 NOTE — Patient Instructions (Addendum)
 Pre visit review using our clinic review tool, if applicable. No additional management support is needed unless otherwise documented below in the visit note.  Stop taking all warfarin. I will contact you by phone today or tomorrow concerning when to start Eliquis .

## 2023-11-18 NOTE — Addendum Note (Signed)
 Addended by: ETHYL KIRSCH A on: 11/18/2023 03:26 PM   Modules accepted: Orders

## 2023-11-18 NOTE — Assessment & Plan Note (Signed)
 In light of recent memory issues / discussed again option of change to eliquis  from warfarin (to cut down on variability of effectiveness and frequent visits)   In past eliquis  caused bleeding after hemorrhoid surgery  No problems since Would like to try it again  Plan to transition over to eliquis  5 mg bid and update if any problems

## 2023-11-18 NOTE — Progress Notes (Signed)
 Per Dr. Randeen verbal order, pt is to start Eliquis  tomorrow at 5 mg BID.    Advised pt of Eliquis  and when to start. Pt wrote it on her calendar. Advised script will be sent today and for her to pick up today and start tomorrow morning. Pt repeated instructions and reported she has already removed warfarin from her pill box. Advised if anything is needed feel free to contact the coumadin  clinic or PCP clinic. Pt verbalized understanding.    Sent in script for Eliquis .

## 2023-11-18 NOTE — Patient Instructions (Addendum)
 I will work on referring you to a neurologist to discuss memory in more detail  Also tremor   Please let us  know if you don't hear in 1-2 weeks to set that up (mychart message or call or letter)   I want to transition you from warfarin to eliquis  (to make your life easier)   I also want to have you meet with our pharmacist to review medications in more detail and make it easier for you to take them regularly and understand what they are for     Continue the medication for depression (sertraline )

## 2023-11-18 NOTE — Progress Notes (Addendum)
 Pt already took warfarin today. Pt will start Eliquis . Advised pt to stop all warfarin and this nurse will call her later today or tomorrow for when to start Eliquis . Pt verbalized understanding.  Pt had questions about Eliquis . All questions answered and educated to watch for s/s of abnormal bruising or bleeding. Pt verbalized understanding.  Stop taking all warfarin. I will contact you by phone today or tomorrow concerning when to start Eliquis .

## 2023-11-18 NOTE — Progress Notes (Signed)
 Subjective:    Patient ID: [name], female    DOB: 1933-03-11, 89 y.o.   MRN: [id number]  HPI  Wt Readings from Last 3 Encounters:  11/18/23 178 lb 2 oz (80.8 kg)  10/25/23 173 lb 2 oz (78.5 kg)  10/19/23 172 lb (78 kg)   29.64 kg/m  Vitals:   11/18/23 0820  BP: 116/64  Pulse: 75  Temp: 97.6 F (36.4 C)  SpO2: 96%   Pt presents for follow up of  Fatigue Memory  Mood / depression  Itch  Muscle cramps- PT was ordered   Memory  Per coag nurse- did not remember picking up her medication  Difficulty remembering to take her medicine  On warfarin   In past - took apixaban  - because it caused bleeding after hemorrhoid surgery    We started sertraline  last time - 25 mg daily  Tolerating it ok  No problems  Thinks she is doing fairly good  Stays in a good routine  Uses pill box   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     Getting ready to sell her home  Will move close to family in [facility]        11/18/2023    9:40 AM 10/11/2023   11:14 AM 09/02/2023    9:26 AM 03/22/2023    8:35 AM 03/18/2022   11:12 AM  Depression screen PHQ 2/9  Decreased Interest 0 0 0 0 0  Down, Depressed, Hopeless 0 0 0 1 0  PHQ - 2 Score 0 0 0 1 0  Altered sleeping 3      Tired, decreased energy 3      Change in appetite 1      Feeling bad or failure about yourself  0      Trouble concentrating 0      Moving slowly or fidgety/restless 0      Suicidal thoughts 0      PHQ-9 Score 7      Difficult doing work/chores Somewhat difficult          09/02/2023    9:26 AM  GAD 7 : Generalized Anxiety Score  Nervous, Anxious, on Edge 0  Control/stop worrying 0  Worry too much - different things 0  Trouble relaxing 0  Restless 0  Easily annoyed or irritable 0  Afraid - awful might happen 0  Total GAD 7 Score 0   PHQ 7 Gad7  0     Patient Active Problem List   Diagnosis Date Noted   Medication management 11/18/2023   DDD (degenerative disc disease), lumbar  10/27/2023   Leg pain, bilateral 10/25/2023   Muscle cramps 10/19/2023   Decreased GFR 10/19/2023   Memory change 10/18/2023   Nonrheumatic mitral valve regurgitation 09/21/2022   Other fatigue 10/08/2021   Salt craving 06/25/2021   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/20/2020   Hearing loss 06/13/2020   S/P placement of cardiac pacemaker MDT 07/28/19 07/29/2019   History of CVA (cerebrovascular accident) 06/16/2019   Facial tingling 11/29/2018   Tremor of left hand 11/29/2018   Iron  deficiency anemia 10/21/2017   Constipation 08/02/2017   Numbness in feet 07/14/2017   Hip osteoarthritis 04/27/2017   Long term (current) use of anticoagulants - on coumadin  for afib 03/04/2017   Venous stasis dermatitis of both lower extremities 01/08/2017   Osteopenia 10/25/2016   Pedal edema 08/26/2016   Varicose veins of both lower extremities 08/26/2016   Estrogen deficiency 08/26/2016   Hemorrhoids  08/26/2016   History of nonmelanoma skin cancer 01/01/2016   Pruritus 10/04/2015   Hip pain 08/02/2014   Left knee pain 08/02/2014   Chronic cough 05/08/2014   Fatigue 08/16/2013   Left ovarian cyst 03/14/2013   (HFpEF) heart failure with preserved ejection fraction (HCC) 12/27/2012   PULMONARY NODULE 12/20/2008   Ganglion 10/04/2007   Mixed incontinence 04/28/2007   Hyperlipidemia 04/27/2007   Depression with anxiety 04/27/2007   Asthma, mild intermittent 04/27/2007   Insomnia 04/27/2007   Acquired hypothyroidism 09/02/2006   Atrial fibrillation, chronic (HCC) 08/05/2006   Past Medical History:  Diagnosis Date   ADENOMATOUS COLONIC POLYP 11/04/2006   Qualifier: Diagnosis of   By: Earlean CMA (AAMA), Amanda         Allergic rhinitis    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 s/p PVI [name] 2010;  b. on tikosyn /coumadin ;  c. 05/2009 Echo: EF 60-65%, Gr 2 DD. (first dx 09/ 2007)   AV  block 07/28/2019   Bilateral lower extremity edema    Bleeding hemorrhoid    Carotid stenosis    mild (hosp 3/11)- consult by vasc/ Dr Oris   Complication of anesthesia    hard to wake   Diverticulosis of colon    Dyspnea    on exertion-climbing stairs   Fatty liver    H/O cardiac radiofrequency ablation    01/ 2008 at [facility]  /  03/ 2010  at [facility]   Heart failure with preserved ejection fraction [facility])    History of adenomatous polyp of colon    tubular adenoma's   History of cardiomyopathy    secondary tachycardia-induced cardiomyopathy -- resolved 2014   History of colonic polyps 09/18/2009   Qualifier: Diagnosis of   By: Genie CMA LEODIS), Chick      IMO SNOMED Dx Update Oct 2024   Formatting of this note might be different from the original. Qualifier: Diagnosis of By: Kowalk CMA LEODIS), Chick     History of squamous cell carcinoma in situ (SCCIS) of skin    05/ 2017  nasal bridge and right medial knee   History of transient ischemic attack (TIA)    01-24-2005 and 06-12-2009   Hyperlipidemia    Hypothyroidism    Mild intermittent asthma    reacts to cats   Mixed stress and urge urinary incontinence    Presence of heart assist device (HCC) 03/25/2023   Presence of permanent cardiac pacemaker    was put in 07/2019   Pulmonary nodule    S/P AV nodal ablation 07/28/19 07/29/2019   S/P mitral valve repair 10-23-1998  dr lansing at [facility]   for MVP and regurg. 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   Past Surgical History:  Procedure Laterality Date   APPENDECTOMY  1978   AV NODE ABLATION N/A 07/28/2019   Procedure: AV NODE ABLATION;  Surgeon: Fernande Elspeth BROCKS, MD;  Location: [facility] INVASIVE CV LAB;  Service: Cardiovascular;  Laterality: N/A;   BUBBLE STUDY  06/19/2019   Procedure: BUBBLE STUDY;  Surgeon: Mona Vinie BROCKS, MD;  Location: [facility] ENDOSCOPY;   Service: Cardiovascular;;   CARDIAC ELECTROPHYSIOLOGY MAPPING AND ABLATION  01/ 2008    at [facility] [place]    right-sided ablation atrial  flutter   CARDIAC ELECTROPHYSIOLOGY STUDY AND ABLATION  03/ 2010   dr marland at [facility]   AV node ablation and pulmonary vein isolation for atrial fib   CARDIOVERSION  06-18-2006;  07-13-2006;  10-19-2010;  10-27-2010   CATARACT EXTRACTION W/PHACO Right 04/09/2021   Procedure: CATARACT EXTRACTION PHACO AND INTRAOCULAR LENS PLACEMENT (IOC) RIGHT 6.71 01:11.1;  Surgeon: Mittie Gaskin, MD;  Location: [facility] SURGERY CNTR;  Service: Ophthalmology;  Laterality: Right;   CATARACT EXTRACTION W/PHACO Left 04/23/2021   Procedure: CATARACT EXTRACTION PHACO AND INTRAOCULAR LENS PLACEMENT (IOC) LEFT;  Surgeon: Mittie Gaskin, MD;  Location: [facility] SURGERY CNTR;  Service: Ophthalmology;  Laterality: Left;  [place] 5.65 00:50.1   COLONOSCOPY     COLONOSCOPY WITH PROPOFOL  N/A 10/13/2017   Procedure: COLONOSCOPY WITH PROPOFOL ;  Surgeon: Therisa Bi, MD;  Location: [facility] ENDOSCOPY;  Service: Gastroenterology;  Laterality: N/A;   COLONOSCOPY WITH PROPOFOL  N/A 02/20/2020   Procedure: COLONOSCOPY WITH PROPOFOL ;  Surgeon: Maryruth Ole DASEN, MD;  Location: [facility] ENDOSCOPY;  Service: Endoscopy;  Laterality: N/A;   CYSTO/ TRANSURETHRAL COLLAGEN INJECTION THERAPY  07-26-2007   dr gaston   DILATION AND CURETTAGE OF UTERUS     ESOPHAGOGASTRODUODENOSCOPY (EGD) WITH PROPOFOL  N/A 10/13/2017   Procedure: ESOPHAGOGASTRODUODENOSCOPY (EGD) WITH PROPOFOL ;  Surgeon: Therisa Bi, MD;  Location: [facility] ENDOSCOPY;  Service: Gastroenterology;  Laterality: N/A;   EVALUATION UNDER ANESTHESIA WITH HEMORRHOIDECTOMY N/A 04/09/2020   Procedure: EXAM UNDER ANESTHESIA WITH HEMORRHOIDECTOMY;  Surgeon: Jordis Laneta FALCON, MD;  Location: [facility] ORS;  Service: General;  Laterality: N/A;   EXCISIONAL HEMORRHOIDECTOMY  1980s   GIVENS CAPSULE STUDY N/A 12/08/2017   Procedure: GIVENS CAPSULE STUDY;   Surgeon: Therisa Bi, MD;  Location: [facility] ENDOSCOPY;  Service: Gastroenterology;  Laterality: N/A;   HEMORRHOID SURGERY N/A 10/29/2016   Procedure: HEMORRHOIDECTOMY;  Surgeon: Debby Hila, MD;  Location: [facility];  Service: General;  Laterality: N/A;   MITRAL VALVE ANNULOPLASTY  10/23/1998   Model [id number]; [place] [id number]; size 32mm; [facility]; Dr. Lansing   PACEMAKER IMPLANT N/A 07/28/2019   Procedure: PACEMAKER IMPLANT;  Surgeon: Fernande Elspeth BROCKS, MD;  Location: [name] INVASIVE CV LAB;  Service: Cardiovascular;  Laterality: N/A;   PILONIDAL CYST EXCISION  1954   RIGHT/LEFT HEART CATH AND CORONARY ANGIOGRAPHY Bilateral 09/21/2022   Procedure: RIGHT/LEFT HEART CATH AND CORONARY ANGIOGRAPHY;  Surgeon: Darron Deatrice LABOR, MD;  Location: [facility] INVASIVE CV LAB;  Service: Cardiovascular;  Laterality: Bilateral;   TEE WITH CARDIOVERSION  05-06-2006 at [facility];  01-02-2013 at [facility]   TEE WITHOUT CARDIOVERSION N/A 06/19/2019   Procedure: TRANSESOPHAGEAL ECHOCARDIOGRAM (TEE);  Surgeon: Mona Vinie BROCKS, MD;  Location: [facility] ENDOSCOPY;  Service: Cardiovascular;  Laterality: N/A;   TOTAL HIP ARTHROPLASTY Left 05/04/2017   Procedure: LEFT TOTAL HIP ARTHROPLASTY ANTERIOR APPROACH;  Surgeon: Vernetta Lonni GRADE, MD;  Location: [facility] OR;  Service: Orthopedics;  Laterality: Left;   TRANSTHORACIC ECHOCARDIOGRAM  05-01-2015   dr fernande   ef 50-55%/  mild AV sclerosis without stenosis/  post MV repair with mild central MR (valve area by pressure half-time 2cm^2,  valve area by continutity equation 0.91cm^2, peak grandiant 75mmHg)/  severe LAE/ mild TR/ mild RAE    TUBAL LIGATION Bilateral 1978   Social History   Tobacco Use   Smoking status: Never   Smokeless tobacco: Never  Vaping Use   Vaping status: Never Used  Substance Use Topics   Alcohol  use: Not Currently    Comment: seldom   Drug use: No   Family History  Problem Relation Age of Onset   Lung cancer Father        smoker, died at 51    Alcohol  abuse Father    Cancer Father        bladder and lung CA smoker   Sudden death Other    Breast cancer Neg Hx    Stroke Neg Hx    Allergies  Allergen Reactions   Pacerone [Amiodarone] Swelling   Penicillins Hives and Rash    Tolerates ceftriaxone  during July 2025 admission   Zetia  [Ezetimibe ] Other (See Comments)    Unknown reaction   Lopressor  [Metoprolol ] Other (See Comments)    Hair loss   Statins Rash   Current Outpatient Medications on File Prior to Visit  Medication Sig Dispense Refill   acetaminophen  (TYLENOL ) 325 MG tablet Take 650 mg by mouth 2 (two) times daily as needed for moderate pain (pain score 4-6), fever or headache.     albuterol  (VENTOLIN  HFA) 108 (90 Base) MCG/ACT inhaler Inhale 1-2 puffs into the lungs every 6 (six) hours as needed for wheezing or shortness of breath. 18 g 2   bisoprolol  (ZEBETA ) 5 MG tablet Take 1 tablet (5 mg total) by mouth daily. 30 tablet 11   famotidine  (PEPCID ) 20 MG tablet Take 1 tablet (20 mg total) by mouth 2 (two) times daily. 60 tablet 1   ferrous sulfate  325 (65 FE) MG EC tablet Take 1 tablet (325 mg total) by mouth daily with breakfast. 90 tablet 2   fluticasone  (FLONASE ) 50 MCG/ACT nasal spray Place 1 spray into both nostrils 2 (two) times daily as needed for allergies or rhinitis. 16 g 11   furosemide  (LASIX ) 40 MG tablet Take 1 tablet (40 mg total) by mouth daily. 90 tablet 3   JARDIANCE  10 MG TABS tablet TAKE 1 TABLET BY MOUTH DAILY BEFORE BREAKFAST. 30 tablet 6   levothyroxine  (SYNTHROID ) 75 MCG tablet TAKE 1 TABLET EVERY DAY BEFORE BREAKFAST 90 tablet 1   mexiletine (MEXITIL ) 200 MG capsule TAKE 1 CAPSULE BY MOUTH TWICE A DAY 180 capsule 1   sertraline  (ZOLOFT ) 25 MG tablet Take 1 tablet (25 mg total) by mouth daily. In evening 30 tablet 1   UNABLE TO FIND Take 1 tablet by mouth daily. Unknown OTC allergy medication, red tablet (not pseudoephedrine).     warfarin (COUMADIN ) 5 MG tablet Take 0.5-1 tablets (2.5-5 mg  total) by mouth See admin instructions. Take 1/2 tablet (2.5mg ) by mouth once daily on Monday, Wednesday, Friday and take 1 tablet (5mg ) once daily on all other days.     losartan  (COZAAR ) 25 MG tablet TAKE 1 TABLET (25 MG TOTAL) BY MOUTH DAILY. 90 tablet 3   spironolactone  (ALDACTONE ) 25 MG tablet Take 0.5 tablets (12.5 mg total) by mouth daily. 45 tablet 3   No current facility-administered medications on file prior to visit.    Review of Systems  Constitutional:  Negative for activity change, appetite change, fatigue, fever and unexpected weight change.  HENT:  Negative for congestion, ear pain, rhinorrhea, sinus pressure and sore throat.   Eyes:  Negative for pain, redness and visual disturbance.  Respiratory:  Negative for cough, shortness of breath and wheezing.   Cardiovascular:  Negative for chest pain and palpitations.  Gastrointestinal:  Negative for abdominal pain, blood in stool, constipation and diarrhea.  Endocrine: Negative for polydipsia and polyuria.  Genitourinary:  Negative for dysuria, frequency and urgency.  Musculoskeletal:  Negative for arthralgias, back pain and myalgias.  Skin:  Negative for pallor and rash.  Allergic/Immunologic: Negative for environmental allergies.  Neurological:  Positive for tremors. Negative for dizziness, syncope and headaches.  Hematological:  Negative for adenopathy. Does not bruise/bleed easily.  Psychiatric/Behavioral:  Positive for decreased concentration and dysphoric mood. Negative for confusion. The patient is not nervous/anxious.        Mood is fairly stable   Short term memory is not optimal No confusion  Continuest to read a lot       Objective:   Physical Exam Constitutional:      General: She is not in acute distress.    Appearance: Normal appearance. She is well-developed and normal weight. She is not ill-appearing or diaphoretic.  HENT:     Head: Normocephalic and atraumatic.     Mouth/Throat:     Mouth: Mucous  membranes are moist.  Eyes:     Conjunctiva/sclera: Conjunctivae normal.     Pupils: Pupils are equal, round, and reactive to light.  Neck:     Thyroid : No thyromegaly.     Vascular: No carotid bruit or JVD.  Cardiovascular:     Rate and Rhythm: Normal rate.     Heart sounds:     No gallop.  Pulmonary:     Effort: Pulmonary effort is normal. No respiratory distress.  Abdominal:     General: There is no distension or abdominal bruit.     Palpations: Abdomen is soft.  Musculoskeletal:     Cervical back: Normal range of motion and neck supple.     Right lower leg: No edema.     Left lower leg: No edema.  Lymphadenopathy:     Cervical: No cervical adenopathy.  Skin:    General: Skin is warm and dry.     Coloration: Skin is not pale.     Findings: No rash.  Neurological:     Mental Status: She is alert.     Cranial Nerves: No cranial nerve deficit.     Motor: No weakness.     Coordination: Coordination normal.     Deep Tendon Reflexes: Reflexes are normal and symmetric. Reflexes normal.     Comments: Mild fine hand tremor  Slightly worse in left  With intention  No cogwheel rigidity or bradykinesia   Psychiatric:        Attention and Perception: Attention normal.        Mood and Affect: Affect is not tearful.        Speech: Speech is tangential.        Behavior: Behavior normal.        Cognition and Memory: She exhibits impaired recent memory.     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 As  to whether she has picked up some medication   Today MMSE score of 26/30  Concentration seems good but short term memory is where she lacks  Good clock draw   On exam-pt does repeat her self and also somewhat tangential in speech   Will refer to neurology for further eval of memory loss  Also ref to pharm for help with med management /compliance    Pt has ultimate goal of leaving her large house and moving to [facility] closer to family         Relevant Orders   Ambulatory referral to Neurology   AMB Referral [facility] Care Management   Medication management   Some concern with med compliance in setting of short term memory loss   Would like to have better understanding of medications   Refer to pharmacy team for this       Relevant Orders   AMB Referral [facility] Care Management   Long term (current) use of anticoagulants - on coumadin  for afib   In light of recent memory issues / discussed again option of change to eliquis  from warfarin (to cut down on variability of effectiveness and frequent visits)   In past eliquis  caused bleeding after hemorrhoid surgery  No problems since Would like to try it again  Plan to transition over to eliquis  5 mg bid and update if any problems      Depression with anxiety - Primary   Taking sertraline  25 mg daily  Tolerating well/no complaints Mood does seem lighter today and less anxious   Reviewed stressors/ coping techniques/symptoms/ support sources/ tx options and side effects in detail today   No doubt mood impacts her memory but short term memory issues warrant further eval as well

## 2023-11-18 NOTE — Assessment & Plan Note (Signed)
 Some concern with med compliance in setting of short term memory loss   Would like to have better understanding of medications   Refer to pharmacy team for this

## 2023-11-19 ENCOUNTER — Other Ambulatory Visit: Payer: Self-pay | Admitting: Pharmacist

## 2023-11-19 ENCOUNTER — Telehealth: Payer: Self-pay | Admitting: *Deleted

## 2023-11-19 DIAGNOSIS — I5033 Acute on chronic diastolic (congestive) heart failure: Secondary | ICD-10-CM

## 2023-11-19 NOTE — Telephone Encounter (Signed)
 Thanks so much I can sign it

## 2023-11-19 NOTE — Telephone Encounter (Signed)
 Pt contacted coumadin  clinic to verify she was to stop taking warfarin and start Eliquis . Advise that is correct.  She also reported the price is not $600, it is $47/month, and she can afford this so she is starting Eliquis  today and has stopped warfarin yesterday.

## 2023-11-19 NOTE — Telephone Encounter (Signed)
 Pt was started on Eliquis  yesterday (med she is referring to), will route PCP and per provider request will send to Manuelita and her team to see if a PA or a tier exception is available

## 2023-11-19 NOTE — Telephone Encounter (Signed)
 Just received word from [facility] and it turns out medicine was 47$ and pt can afford that and she is starting that Thanks for the help  No need for pharm help

## 2023-11-19 NOTE — Progress Notes (Signed)
 Care Guide Pharmacy Note  11/19/2023 Name: [name] MRN: [id number] DOB: [birth year]  Referred By: Randeen Laine LABOR, MD Reason for referral: Complex Care Management, Call Attempt #1 (Unsuccessful initial outreach to schedule with PHARM DGLENWOOD Shaver), and Call Attempt #2 (Successful initial outreach scheduled with PHARM D- Shaver )   [name] is a 89 y.o. year old female who is a primary care patient of Tower, Laine LABOR, MD.  [name] was referred to the pharmacist for assistance related to: medication adherence  Successful contact was made with the patient to discuss pharmacy services including being ready for the pharmacist to call at least 5 minutes before the scheduled appointment time and to have medication bottles and any blood pressure readings ready for review. The patient agreed to meet with the pharmacist via telephone visit on (date/time). 12/01/23 @ 11 AM  [name], [facility] Guide  Direct Dial: [phone]  Fax [phone]

## 2023-11-19 NOTE — Telephone Encounter (Signed)
 Copied from CRM [id number]. Topic: Clinical - Prescription Issue >> Nov 18, 2023  5:21 PM Deleta RAMAN wrote: Reason for CRM: patient called to notify medication is over $600 she did not provide which medication and hung up in my face

## 2023-11-19 NOTE — Progress Notes (Signed)
 Brief Telephone Documentation Reason for Call: Medication Access  Summary of Call: Patient initially noted concern with cost of Eliquis  Rx due to insurance deductible. Subsequently, she confirms that the $47/month thereafter is affordable.   Called patient to ensure no further questions and to review Medicare structure. Deductible will be an annual occurrence.   Patient confirms that $47/month is feasible for her. States most of her medications are $0 so the cost confuses her. We reviewed brand vs generic medication status. Her brand medications include Jardiance  and Eliquis .   Discussed the option for PAP to possibly receive Jardiance  for free from the manufacturer which could help to cut dow month prescription costs.  (Eliquis  PAP not feasible give requirement for 3% OOP spending). If cost becomes an issue in subsequent years, could consider Xarelto PAP if deemed appropriate.    Patient Assistance Program (PAP) Application   Manufacturer: [employer] ([employer])    (New enrollment) Medication(s): Jardiance  10 mg  Patient Portion of Application:  8/29: Filled out and uploaded to clinic eFax folder for patient signature in front office. Plans to sign Tuesday morning (clinic closed Mon for Labor Day)  Next Steps: [x]    Application filled out and uploaded to eFax folder for review/signature in front office []    Once patient signs, front office to place application in PCP folder []    Upon PCP signature Application to be faxed to: [employer] Fax: [phone] with copy of patient's insurance card AND scanned into patient chart  Discussed need for proof of income via SSI letter though she feels she may have shredded this. Will look for it this weekend.    [name], PharmD Clinical Pharmacist [facility] [phone]

## 2023-11-19 NOTE — Telephone Encounter (Signed)
 Please let ms [name] know we are going to ask for help from the pharmacy program here to see if we can figure out what could be affordable And I'm so very sorry we did not know this was not covered  Thanks for all the help everyone   Also routing back to myself

## 2023-11-23 ENCOUNTER — Encounter: Payer: Self-pay | Admitting: Oncology

## 2023-11-24 ENCOUNTER — Ambulatory Visit: Payer: Self-pay

## 2023-11-24 NOTE — Progress Notes (Signed)
 Pt has been transitioned to Eliquis 

## 2023-11-29 ENCOUNTER — Telehealth: Payer: Self-pay | Admitting: *Deleted

## 2023-11-29 ENCOUNTER — Ambulatory Visit: Attending: Family Medicine | Admitting: Physical Therapy

## 2023-11-29 ENCOUNTER — Telehealth: Payer: Self-pay | Admitting: Physical Therapy

## 2023-11-29 ENCOUNTER — Encounter: Payer: Self-pay | Admitting: *Deleted

## 2023-11-29 NOTE — Therapy (Incomplete)
 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 s/p PVI [name] 2010;  b. on tikosyn /coumadin ;  c. 05/2009 Echo: EF 60-65%, Gr 2 DD. (first dx 09/ 2007)   AV block 07/28/2019   Bilateral lower extremity edema    Bleeding hemorrhoid    Carotid stenosis    mild (hosp 3/11)- consult by vasc/ Dr Oris   Complication of anesthesia    hard to wake   Diverticulosis of colon    Dyspnea    on exertion-climbing stairs   Fatty liver    H/O cardiac radiofrequency ablation    01/ 2008 at [facility] [place]  /  03/ 2010  at [facility]   Heart failure with preserved ejection fraction [facility])    History of adenomatous polyp of colon    tubular adenoma's   History of cardiomyopathy    secondary tachycardia-induced cardiomyopathy -- resolved 2014   History of colonic polyps 09/18/2009   Qualifier: Diagnosis of   By: Genie CMA LEODIS), Chick      IMO SNOMED Dx Update Oct 2024   Formatting of this note might be different from the original. Qualifier: Diagnosis of By: Kowalk CMA LEODIS), Chick     History of squamous cell carcinoma in situ (SCCIS) of skin    05/ 2017  nasal bridge and right medial knee   History of transient ischemic attack (TIA)    01-24-2005 and 06-12-2009   Hyperlipidemia    Hypothyroidism    Mild intermittent asthma    reacts to cats   Mixed stress and urge urinary incontinence    Presence of heart assist device (HCC) 03/25/2023   Presence of permanent cardiac  pacemaker    was put in 07/2019   Pulmonary nodule    S/P AV nodal ablation 07/28/19 07/29/2019   S/P mitral valve repair 10-23-1998  dr lansing at [facility]   for MVP and regurg. 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   Past Surgical History:  Procedure Laterality Date   APPENDECTOMY  1978   AV NODE ABLATION N/A 07/28/2019   Procedure: AV NODE ABLATION;  Surgeon: Fernande Elspeth BROCKS, MD;  Location: [facility] INVASIVE CV LAB;  Service: Cardiovascular;  Laterality: N/A;   BUBBLE STUDY  06/19/2019   Procedure: BUBBLE STUDY;  Surgeon: Mona Vinie BROCKS, MD;  Location: [facility] ENDOSCOPY;  Service: Cardiovascular;;   CARDIAC ELECTROPHYSIOLOGY MAPPING AND ABLATION  01/ 2008    at [facility] [place]    right-sided ablation atrial flutter   CARDIAC ELECTROPHYSIOLOGY STUDY AND ABLATION  03/ 2010   dr ogles at [facility]   AV node ablation and pulmonary vein isolation for atrial fib   CARDIOVERSION  06-18-2006;  07-13-2006;  10-19-2010;  10-27-2010   CATARACT EXTRACTION W/PHACO Right 04/09/2021   Procedure: CATARACT EXTRACTION PHACO AND INTRAOCULAR  LENS PLACEMENT (IOC) RIGHT 6.71 01:11.1;  Surgeon: Mittie Gaskin, MD;  Location: [facility] SURGERY CNTR;  Service: Ophthalmology;  Laterality: Right;   CATARACT EXTRACTION W/PHACO Left 04/23/2021   Procedure: CATARACT EXTRACTION PHACO AND INTRAOCULAR LENS PLACEMENT (IOC) LEFT;  Surgeon: Mittie Gaskin, MD;  Location: [facility] SURGERY CNTR;  Service: Ophthalmology;  Laterality: Left;  [place] 5.65 00:50.1   COLONOSCOPY     COLONOSCOPY WITH PROPOFOL  N/A 10/13/2017   Procedure: COLONOSCOPY WITH PROPOFOL ;  Surgeon: Therisa Bi, MD;  Location: [facility] ENDOSCOPY;  Service: Gastroenterology;  Laterality: N/A;   COLONOSCOPY WITH PROPOFOL  N/A 02/20/2020   Procedure: COLONOSCOPY WITH PROPOFOL ;  Surgeon: Maryruth Ole DASEN, MD;  Location: [facility] ENDOSCOPY;  Service:  Endoscopy;  Laterality: N/A;   CYSTO/ TRANSURETHRAL COLLAGEN INJECTION THERAPY  07-26-2007   dr gaston   DILATION AND CURETTAGE OF UTERUS     ESOPHAGOGASTRODUODENOSCOPY (EGD) WITH PROPOFOL  N/A 10/13/2017   Procedure: ESOPHAGOGASTRODUODENOSCOPY (EGD) WITH PROPOFOL ;  Surgeon: Therisa Bi, MD;  Location: [facility] ENDOSCOPY;  Service: Gastroenterology;  Laterality: N/A;   EVALUATION UNDER ANESTHESIA WITH HEMORRHOIDECTOMY N/A 04/09/2020   Procedure: EXAM UNDER ANESTHESIA WITH HEMORRHOIDECTOMY;  Surgeon: Jordis Laneta FALCON, MD;  Location: [facility] ORS;  Service: General;  Laterality: N/A;   EXCISIONAL HEMORRHOIDECTOMY  1980s   GIVENS CAPSULE STUDY N/A 12/08/2017   Procedure: GIVENS CAPSULE STUDY;  Surgeon: Therisa Bi, MD;  Location: [facility] ENDOSCOPY;  Service: Gastroenterology;  Laterality: N/A;   HEMORRHOID SURGERY N/A 10/29/2016   Procedure: HEMORRHOIDECTOMY;  Surgeon: Debby Hila, MD;  Location: [facility];  Service: General;  Laterality: N/A;   MITRAL VALVE ANNULOPLASTY  10/23/1998   Model [id number]; [place] [id number]; size 32mm; [facility]; Dr. Lansing   PACEMAKER IMPLANT N/A 07/28/2019   Procedure: PACEMAKER IMPLANT;  Surgeon: Fernande Elspeth BROCKS, MD;  Location: [facility] INVASIVE CV LAB;  Service: Cardiovascular;  Laterality: N/A;   PILONIDAL CYST EXCISION  1954   RIGHT/LEFT HEART CATH AND CORONARY ANGIOGRAPHY Bilateral 09/21/2022   Procedure: RIGHT/LEFT HEART CATH AND CORONARY ANGIOGRAPHY;  Surgeon: Darron Deatrice LABOR, MD;  Location: [facility] INVASIVE CV LAB;  Service: Cardiovascular;  Laterality: Bilateral;   TEE WITH CARDIOVERSION  05-06-2006 at [facility];  01-02-2013 at [facility]   TEE WITHOUT CARDIOVERSION N/A 06/19/2019   Procedure: TRANSESOPHAGEAL ECHOCARDIOGRAM (TEE);  Surgeon: Mona Vinie BROCKS, MD;  Location: [facility] ENDOSCOPY;  Service: Cardiovascular;  Laterality: N/A;   TOTAL HIP ARTHROPLASTY Left 05/04/2017   Procedure: LEFT TOTAL HIP ARTHROPLASTY ANTERIOR APPROACH;  Surgeon: Vernetta Lonni GRADE, MD;   Location: [facility] OR;  Service: Orthopedics;  Laterality: Left;   TRANSTHORACIC ECHOCARDIOGRAM  05-01-2015   dr fernande   ef 50-55%/  mild AV sclerosis without stenosis/  post MV repair with mild central MR (valve area by pressure half-time 2cm^2,  valve area by continutity equation 0.91cm^2, peak grandiant 37mmHg)/  severe LAE/ mild TR/ mild RAE    TUBAL LIGATION Bilateral 1978   Patient Active Problem List   Diagnosis Date Noted   Medication management 11/18/2023   DDD (degenerative disc disease), lumbar 10/27/2023   Leg pain, bilateral 10/25/2023   Muscle cramps 10/19/2023   Decreased GFR 10/19/2023   Memory change 10/18/2023   Nonrheumatic mitral valve regurgitation 09/21/2022   Other fatigue 10/08/2021   Salt craving 06/25/2021   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/20/2020   Hearing loss 06/13/2020   S/P placement of cardiac pacemaker MDT 07/28/19 07/29/2019   History of CVA (cerebrovascular accident) 06/16/2019   Facial tingling 11/29/2018   Tremor of left hand 11/29/2018  Iron  deficiency anemia 10/21/2017   Constipation 08/02/2017   Numbness in feet 07/14/2017   Hip osteoarthritis 04/27/2017   Long term (current) use of anticoagulants - on coumadin  for afib 03/04/2017   Venous stasis dermatitis of both lower extremities 01/08/2017   Osteopenia 10/25/2016   Pedal edema 08/26/2016   Varicose veins of both lower extremities 08/26/2016   Estrogen deficiency 08/26/2016   Hemorrhoids 08/26/2016   History of nonmelanoma skin cancer 01/01/2016   Pruritus 10/04/2015   Hip pain 08/02/2014   Left knee pain 08/02/2014   Chronic cough 05/08/2014   Fatigue 08/16/2013   Left ovarian cyst 03/14/2013   (HFpEF) heart failure with preserved ejection fraction (HCC) 12/27/2012   PULMONARY NODULE 12/20/2008   Ganglion 10/04/2007   Mixed incontinence 04/28/2007   Hyperlipidemia 04/27/2007   Depression with anxiety 04/27/2007   Asthma, mild intermittent 04/27/2007   Insomnia 04/27/2007   Acquired  hypothyroidism 09/02/2006    ONSET DATE: 10/19/2023  REFERRING DIAG:  R25.2 (ICD-10-CM) - Muscle cramps  M51.369 (ICD-10-CM) - Degeneration of intervertebral disc of lumbar region, unspecified whether pain present    THERAPY DIAG:  ***  No diagnosis found.  Rationale for Evaluation and Treatment: Rehabilitation  SUBJECTIVE:                                                                                                                                                                                             SUBJECTIVE STATEMENT:  ***R hip weakness?     From Initial Eval: Pt states I don't really know what I'm getting rehab for, I'm not stumbling around. Pt states she lives at home, independently, by herself with no concerns.  Pt states I got up one morning and the muscles in the back of her legs locked up and it was very painful from her knees down. Pt states it went all the way up to her hips. Pt states she hasn't had that same sensation/episode since then and it is not present now. Pt states her R hip every once in a while will catch but it isn't debilitating. Pt states I carry this cane just in case she has imbalance. Pt states other than that, I'm in pretty good shape. Pt states she doesn't have shortness of breath. Pt states she does have a walker that she uses in her house when going up and down the long halls because it is ~18ft long. Pt states when she goes up/down the hall multiple times she does get tired. Pt states every once in a while she will get a catch in her back where she begins to feel it, but  she isn't cramping.  Pt states if she walks without the walker then her hip will start having pain, which is why she purchased it and started using it. Patient states within 10 minutes of activity she will start to have a little achy pain.  Pt states she is planning to purchase a smaller home in the near future where she will also be closer to her son.  Pt  states she isn't interested in doing PT 2x/week. Pt states she gets heart palpitations with increased activity and that she is in advanced stage heart failure. Pt states she doesn't want to do anything that could cause her to have a stroke. Pt states it is more important to her that she keep her mind together more than her independence with mobility.    Pt accompanied by: self  PERTINENT HISTORY: PMH: Anemia, Arthritis, A-fib, Hyperlipidemia, Hypothyroidism, Pacemaker, Nonrheumatic mitral valve regurgitation, Hip OA, per MD note CT head chronic R MCA and PCA territory infarcts and mild CHF signs on cxr  From MD note on 10/25/2023:  Pt has bilateral posterior thigh leg cramps upon rising and starting movement Feels like muscle cramps Overall improved from last visit but still present No source found in labs-reviewed today  Reassuring exam  May benefit from stretching or PT LS film ordered-pending result  (see chart for full details)  PAIN:  Are you having pain? No  PRECAUTIONS: Fall and ICD/Pacemaker  RED FLAGS: None   WEIGHT BEARING RESTRICTIONS: No  FALLS: Has patient fallen in last 6 months? No and no near falls  LIVING ENVIRONMENT: Lives with: lives alone Lives in: House/apartment - states she is planning to move closer to her son in [place]: No Has following equipment at home: Single point cane and Environmental consultant - 2 wheeled  PLOF: Independent, Independent with basic ADLs, Independent with household mobility with device, and Independent with community mobility with device  PATIENT GOALS: patient does not feel physical therapy is needed at this time, but her goal is to remain as independent as possible both mentally and physically  OBJECTIVE:  Note: Objective measures were completed at Evaluation unless otherwise noted.  DIAGNOSTIC FINDINGS:  CLINICAL DATA:  Bilateral buttock and leg pain.   EXAM: LUMBAR SPINE - 2-3 VIEW   COMPARISON:  None Available.    FINDINGS: Very mild dextroconvex curvature. Minimal grade 1 anterolisthesis L3 on L4 and L4 on L5. Alignment is otherwise anatomic. Vertebral body and disc space heights are maintained. Mild endplate degenerative changes in the mid and lower lumbar spine with facet hypertrophy.   IMPRESSION: 1. Mid and lower lumbar degenerative disc disease and facet hypertrophy. 2. Minimal grade 1 anterolisthesis of L3 on L4 and L4 on L5.     Electronically Signed   By: Newell Eke M.D.   On: 10/25/2023 16:08  COGNITION: Overall cognitive status: No family/caregiver present to determine baseline cognitive functioning; however, patient reporting same story at least ~3-4 times during session and some inconsistencies noted in her subjective report indicating potential cognitive impairments  Patient reports she uses a pill box to manage her medications. Pt reports she is very regimented in ensuring she has things in place at home.   SENSATION: WFL on light touch screen  COORDINATION: Not formally assessed  EDEMA:  Not formally assessed, but minor swelling noted in B LE lower legs during MMT testing  MUSCLE TONE: Not formally assessed  MUSCLE LENGTH: Not formally assessed  DTRs:  Not formally assessed  POSTURE: rounded shoulders  and forward head  LOWER EXTREMITY ROM:     Active   WFL for tasks assessed Right Eval Left Eval  Hip flexion    Hip extension    Hip abduction    Hip adduction    Hip internal rotation    Hip external rotation    Knee flexion    Knee extension    Ankle dorsiflexion    Ankle plantarflexion    Ankle inversion    Ankle eversion     (Blank rows = not tested)  LOWER EXTREMITY MMT:    MMT Right Eval Left Eval  Hip flexion 3+ with pain in anterior hip 4-  Hip extension    Hip abduction 4- 3+  Hip adduction 4 4  Hip internal rotation    Hip external rotation    Knee flexion 4, pt reports it as more wobbly 4  Knee extension 4 4+  Ankle  dorsiflexion 4 4  Ankle plantarflexion 4 4  Ankle inversion    Ankle eversion    (Blank rows = not tested)  Manual Muscle Test Scale 0/5 = No muscle contraction can be seen or felt 1/5 = Contraction can be felt, but there is no motion 2-/5 = Part moves through incomplete ROM w/ gravity decreased 2/5 = Part moves through complete ROM w/ gravity decreased 2+/5 = Part moves through incomplete ROM (<50%) against gravity or through complete ROM w/ gravity 3-/5 = Part moves through incomplete ROM (>50%) against gravity 3/5 = Part moves through complete ROM against gravity 3+/5 = Part moves through complete ROM against gravity/slight resistance 4-/5= Holds test position against slight to moderate pressure 4/5 = Part moves through complete ROM against gravity/moderate resistance 4+/5= Holds test position against moderate to strong pressure 5/5 = Part moves through complete ROM against gravity/full resistance  BED MOBILITY:  Not tested  TRANSFERS: Sit to stand: Complete Independence and Modified independence  Assistive device utilized: Single point cane and None     Stand to sit: Complete Independence and Modified independence  Assistive device utilized: Single point cane and None     Chair to chair: Complete Independence and Modified independence  Assistive device utilized: Single point cane and None       RAMP:  Not tested  CURB:  Not tested  STAIRS: Not tested GAIT: Findings: Gait Characteristics: WFL and step through pattern, Distance walked: ~152ft, Assistive device utilized:None, Level of assistance: Complete Independence and Modified independence, and Comments: gait into clinic using SPC and gait in clinic without AD  FUNCTIONAL TESTS:  Functional gait assessment: need to assess  10 Meter Walk Test: Patient instructed to walk 10 meters (32.8 ft) as quickly and as safely as possible at their normal speed x2 and at a fast speed x2. Time measured from 2 meter mark to 8 meter  mark to accommodate ramp-up and ramp-down.  Normal speed 1: 0.90 m/s (11 seconds) Normal speed 2: 0.947 m/s (10.55 seconds) Average Normal speed: 0.923 m/s, independently, no AD Fast speed 1: 1.18 m/s (8.45 seconds) Fast speed 2: 1.17 m/s (8.50 seconds) Average Fast speed: 1.175 m/s, independently, no AD - pt reports she is starting to feel it in her calf muscles, but isn't cramping Cut off scores: <0.4 m/s = household Ambulator, 0.4-0.8 m/s = limited community Ambulator, >0.8 m/s = community Ambulator, >1.2 m/s = crossing a street, <1.0 = increased fall risk MCID 0.05 m/s (small), 0.13 m/s (moderate), 0.06 m/s (significant)  (ANPTA Core Set of Outcome Measures for  Adults with Neurologic Conditions, 2018)   Five times Sit to Stand Test (FTSS) "Stand up and sit down as quickly as possible 5 times, keeping your arms folded across your chest."    TIME: 16.3 seconds, no UE support  Times > 13.6 seconds is associated with increased disability and morbidity (Guralnik, 2000) Times > 15 seconds is predictive of recurrent falls in healthy individuals aged 65 and older (Buatois, et al., 2008) Normal performance values in community dwelling individuals aged 39 and older (Bohannon, 2006): 60-69 years: 11.4 seconds 70-79 years: 12.6 seconds 80-89 years: 14.8 seconds  MCID: >= 2.3 seconds for Vestibular Disorders (Meretta, 2006)   PATIENT SURVEYS:  ABC scale: The Activities-Specific Balance Confidence (ABC) Scale 0% 10 20 30  40 50 60 70 80 90 100% No confidence<->completely confident  "How confident are you that you will not lose your balance or become unsteady when you . . .   Date tested 11/16/2023  Walk around the house 100%  2. Walk up or down stairs 80%  3. Bend over and pick up a slipper from in front of a closet floor 100%  4. Reach for a small can off a shelf at eye level 100%  5. Stand on tip toes and reach for something above your head 100%  6. Stand on a chair and reach for  something 80%  7. Sweep the floor 75%  8. Walk outside the house to a car parked in the driveway 100%  9. Get into or out of a car 100%  10. Walk across a parking lot to the mall 80%  11. Walk up or down a ramp 50% - patient states mostly this is challenging due to impaired cardiac endurance from heart failure  12. Walk in a crowded mall where people rapidly walk past you 70%   13. Are bumped into by people as you walk through the mall 100%  14. Step onto or off of an escalator while you are holding onto the railing 100%  15. Step onto or off an escalator while holding onto parcels such that you cannot hold onto the railing 80%  16. Walk outside on icy sidewalks 50%  Total: #/16 85.31%                                                                                                                                 TREATMENT DATE: 11/29/2023  ***  ABI (ankle brachial index) FGA May NOT want to do due to pt's concern for her heart Address R hip pain & initiate HEP for strengthening core and R hip musculature  ***  Unless otherwise stated, CGA was provided and gait belt donned in order to ensure pt safety throughout session.  Patient reports she is getting a little bit of tingling and numbness along bilateral cheek bones and up near temporal lobes following . Pt states at home she hasn't ever been paying attention to know  if she gets this symptom, but because we are doing a test she is noticing it. Pt states it isn't one of those things you woud notice if you weren't looking for something to be different. Pt correlates this symptom to walking fast during .   Patient agreeable to returning for at least 1 more visit and developing an HEP at that visit based on therapist's recommendation to address deficits that are causing patient to require use of RW to ambulate her household distances without pain.  ***   PATIENT EDUCATION: Education details: Findings from assessment,  therapy POC, benefits of physical therapy Person educated: Patient Education method: Explanation Education comprehension: verbalized understanding and needs further education  HOME EXERCISE PROGRAM: Need to provide at next visit  GOALS: Goals reviewed with patient? Yes  SHORT TERM GOALS: Target date: 12/07/2023   Patient will be independent with home exercise program to improve strength/mobility for increased functional independence with ADLs and mobility.  Baseline: need to initiate Goal status: INITIAL   LONG TERM GOALS: Target date: 12/28/2023   Patient (> 65 years old) will complete five times sit to stand test (5XSTS) in < 15 seconds indicating an increased LE strength and improved balance.  Baseline: 16.3 seconds without UE support Goal status: INITIAL  2.  Patient will increase 10 meter walk test to >1.18m/s as to improve gait speed for better community ambulation and to reduce fall risk.  Baseline: 0.923 m/s, independently, no AD Goal status: INITIAL  3.  Patient will increase Functional Gait Assessment (FGA) score to >20/30 as to reduce fall risk and improve dynamic gait safety with community ambulation.  Baseline: need to assess Goal status: INITIAL   ASSESSMENT:  CLINICAL IMPRESSION: *** Patient is an 89 y.o. female who was seen today for physical therapy treatment for an episode of muscle cramps in bilateral lower legs; however, patient reports not having any subsequent episodes of this since. Patient reports she doesn't feel she truly needs physical therapy at this time due to no specific mobility concerns; however, is agreeable to participate in evaluation and assessment of functional outcome measures to assess her fall risk. Patient demonstrates minor deficits on both 5xSTS and indicating slightly decreased gait speed and slightly impaired functional strength in B LEs. Patient reports her main mobility impairment is the recent need to start using RW in the home  due to low back and R hip pain with prolonged walking without AD support; however, patient reports she is the one who initiated her use of the AD and has been doing well with it since. Patient does express concern during therapy session of avoiding any potential mobility tasks that would cause her increased cardiac exertion due to concern for having heart failure and patient worried increased exertion will cause her to have a CVA. Patient in agreement to return for at least 1 additional physical therapy session to perform further dynamic gait assessment as well as help develop her an HEP to address potential core/hip strength deficits that are limiting her gait tolerance without AD support. The pt will benefit from further skilled PT to improve these deficits in order to increase QOL and ease/safety with ADLs.   OBJECTIVE IMPAIRMENTS: decreased activity tolerance, decreased balance, decreased cognition, decreased endurance, decreased knowledge of condition, decreased knowledge of use of DME, decreased mobility, decreased strength, and pain.   ACTIVITY LIMITATIONS: carrying, lifting, bending, squatting, and locomotion level  PARTICIPATION LIMITATIONS: cleaning, laundry, and community activity  PERSONAL FACTORS: Age, Behavior pattern, Time  since onset of injury/illness/exacerbation, and 3+ comorbidities:  Anemia, Arthritis, A-fib, Hyperlipidemia, Hypothyroidism, Pacemaker, Nonrheumatic mitral valve regurgitation, Hip OA, per MD note CT head chronic R MCA and PCA territory infarcts and mild CHF signs on cxr are also affecting patient's functional outcome.   REHAB POTENTIAL: Good  CLINICAL DECISION MAKING: Evolving/moderate complexity  EVALUATION COMPLEXITY: Moderate  PLAN:  PT FREQUENCY: 1-2x/week  PT DURATION: 6 weeks  PLANNED INTERVENTIONS: [id number]- PT Re-evaluation, 97750- Physical Performance Testing, 97110-Therapeutic exercises, 97530- Therapeutic activity, [id number]- Neuromuscular re-education,  97535- Self Care, [id number]- Manual therapy, [id number]- Gait training, [phone]- Orthotic Initial, [phone]- Orthotic/Prosthetic subsequent, [phone]- Canalith repositioning, [phone]- Electrical stimulation (manual), [phone] (1-2 muscles), [phone] (3+ muscles)- Dry Needling, Patient/Family education, Balance training, Stair training, Taping, Joint mobilization, Spinal mobilization, Vestibular training, Cognitive remediation, DME instructions, Cryotherapy, Moist heat, and Biofeedback  PLAN FOR NEXT SESSION: *** ABI (ankle brachial index) FGA May NOT want to do due to pt's concern for her heart Address R hip pain & initiate HEP for strengthening core and R hip musculature   Vasilisa Vore, PT, DPT, NCS, CSRS Physical Therapist - [facility]  [facility]  10:57 AM 11/29/23

## 2023-11-29 NOTE — Telephone Encounter (Signed)
 Pt contacted via telephone and author left voice mail informing of missed appointment and informed pt of future PT appointment date and time.   Casimiro Needle, PT, DPT, NCS, CSRS

## 2023-11-30 NOTE — Progress Notes (Unsigned)
 11/30/2023 Name: [name] MRN: [id number] DOB: 1932-08-06  Subjective  No chief complaint on file.   Care Team: Primary Care Provider: Tower, Laine LABOR, MD Cardiology: Dr. Gardenia  Reason for visit [name] is a 89 y.o. female who presents today for an initial phone visit with the pharmacist for a medication review/reconciliation visit. Referred by Primary Care Provider.   Last PCP visit ***: 89 year old with declining short term memory. Having some med compliance concerns. Refer to pharm to review med list and understand what she is taking / and see if there is better method for staying on top of medications  Medication Access/Adherence: ?  Prescription drug coverage: Payor: MEDICARE / Plan: MEDICARE PART A AND B / Product Type: *No Product type* / .   - Reports that all medications are affordable: {YES/NO:21197} - Medication packaging: {LZ med packaging:31508} - Patient manages their own medications/refills: {YES/NO:21197} - Uses Pillbox: {YES/NO:21197} - Missed medication doses: {LZ med adherence:31509}  HPI: Patient reports concern regarding ***.  Patient {HAS/DOES NOT YJCZ:65250} their medications with then at the time of the visit.   Outpatient Encounter Medications as of 12/01/2023   Medication Sig Notes   acetaminophen  (TYLENOL ) 325 MG tablet Take 650 mg by mouth 2 (two) times daily as needed for moderate pain (pain score 4-6), fever or headache. OTC   albuterol  (VENTOLIN  HFA) 108 (90 Base) MCG/ACT inhaler Inhale 1-2 puffs into the lungs every 6 (six) hours as needed for wheezing or shortness of breath. No recent fill hx Last filled 2024   apixaban  (ELIQUIS ) 5 MG TABS tablet Take 1 tablet (5 mg total) by mouth 2 (two) times daily. Taking Filled 8/28 x30 ds 5 refills remain   bisoprolol  (ZEBETA ) 5 MG tablet Take 1 tablet (5 mg total) by mouth daily. Taking  Filled 6/27 x90 ds 9 refills remain   famotidine  (PEPCID ) 20 MG tablet Take 1 tablet (20 mg total) by  mouth 2 (two) times daily. OTC   ferrous sulfate  325 (65 FE) MG EC tablet Take 1 tablet (325 mg total) by mouth daily with breakfast. OTC   fluticasone  (FLONASE ) 50 MCG/ACT nasal spray Place 1 spray into both nostrils 2 (two) times daily as needed for allergies or rhinitis. OTC   furosemide  (LASIX ) 40 MG tablet Take 1 tablet (40 mg total) by mouth daily. Last filled 08/11/23 x29 ds No refills Prescriber = Sabharwal   JARDIANCE  10 MG TABS tablet TAKE 1 TABLET BY MOUTH DAILY BEFORE BREAKFAST. Filled 11/18/23 x30 ds No refill Prescriber = Sabharwal   levothyroxine  (SYNTHROID ) 75 MCG tablet TAKE 1 TABLET EVERY DAY BEFORE BREAKFAST Last filled 03/07/23 No refills   losartan  (COZAAR ) 25 MG tablet TAKE 1 TABLET (25 MG TOTAL) BY MOUTH DAILY. Filled 09/24/23 x90 ds 3 refills remaining Prescriber = Sabharwal   mexiletine (MEXITIL ) 200 MG capsule TAKE 1 CAPSULE BY MOUTH TWICE A DAY Filled 11/18/23 x90 ds No refills Prescriber = Fernande   sertraline  (ZOLOFT ) 25 MG tablet TAKE 1 TABLET (25 MG TOTAL) BY MOUTH DAILY. IN EVENING Filled 11/18/23 x90 ds 1 refill remaining Prescriber = PCP   spironolactone  (ALDACTONE ) 25 MG tablet Take 0.5 tablets (12.5 mg total) by mouth daily. Filled 09/21/23 x90 ds 3 refills remaining Prescriber = Fernande   No facility-administered encounter medications on file as of 12/01/2023.     Assessment and Plan:   Medication Access All medications were reviewed with patient today and medication list was updated as appropriate.  Barriers  identified / Adherence concerns: *** ***   Medications as of ***  ? Medication name Instructions  Notes   Diabetes***      ***     ***     ***     ***     ***     ***     Hypertension***     ***     ***     ***     ***     ***     Hyperlipidemia***     ***     ***     ***     ***     ***     ***     ***     ***     ***     ***     ***     ***     ***     ***     ***       Future Appointments  Date Time Provider  Department Center  12/01/2023 11:00 AM [facility]- PHARMACIST [facility] [address]  12/07/2023 11:45 AM Pippin, Carly M, PT [facility] None  12/09/2023  9:30 AM Pippin, Carly M, PT [facility] None  12/15/2023 10:15 AM Westbrooks, Reyes SAILOR, PT [facility] None  12/17/2023 10:15 AM Viktoria Massie BRAVO, PT [facility] None  12/20/2023 10:15 AM Pippin, Connell HERO, PT [facility] None  12/22/2023 10:15 AM Devaughn Reyes SAILOR, PT [facility] None  12/27/2023 10:15 AM Pippin, Connell HERO, PT [facility] None  12/29/2023 11:00 AM Devaughn Reyes SAILOR, PT [facility] None  01/03/2024 10:15 AM Pippin, Connell HERO, PT [facility] None  01/06/2024 11:45 AM Viktoria Massie BRAVO, PT [facility] None  01/10/2024 10:15 AM Pippin, Connell HERO, PT [facility] None  01/12/2024 11:00 AM Pippin, Carly M, PT [facility] None  01/17/2024  9:30 AM Pippin, Carly M, PT [facility] None  01/19/2024 11:00 AM Pippin, Carly M, PT [facility] None  01/21/2024  7:05 AM [facility] HVT DEVICE REMOTES [facility]  01/24/2024 11:45 AM Pippin, Carly M, PT [facility] None  01/26/2024 11:45 AM Pippin, Carly M, PT [facility] None  03/22/2024  8:10 AM [facility] ANNUAL WELLNESS VISIT 1 [facility] [address]  04/21/2024  7:05 AM [facility] HVT DEVICE REMOTES [facility]  07/21/2024  7:05 AM [facility] HVT DEVICE REMOTES [facility]  10/20/2024  7:05 AM [facility] HVT DEVICE REMOTES [facility] H&V    [name], PharmD Clinical Pharmacist [facility] [phone]

## 2023-12-01 ENCOUNTER — Other Ambulatory Visit (INDEPENDENT_AMBULATORY_CARE_PROVIDER_SITE_OTHER): Admitting: Pharmacist

## 2023-12-01 DIAGNOSIS — Z79899 Other long term (current) drug therapy: Secondary | ICD-10-CM

## 2023-12-07 ENCOUNTER — Ambulatory Visit: Admitting: Physical Therapy

## 2023-12-07 NOTE — Therapy (Incomplete)
 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 s/p PVI [name] 2010;  b. on tikosyn /coumadin ;  c. 05/2009 Echo: EF 60-65%, Gr 2 DD. (first dx 09/ 2007)   AV block 07/28/2019   Bilateral lower extremity edema    Bleeding hemorrhoid    Carotid stenosis    mild (hosp 3/11)- consult by vasc/ Dr Oris   Complication of anesthesia    hard to wake   Diverticulosis of colon    Dyspnea    on exertion-climbing stairs   Fatty liver    H/O cardiac radiofrequency ablation    01/ 2008 at [facility] [place]  /  03/ 2010  at [facility]   Heart failure with preserved ejection fraction [facility])    History of adenomatous polyp of colon    tubular adenoma's   History of cardiomyopathy    secondary tachycardia-induced cardiomyopathy -- resolved 2014   History of colonic polyps 09/18/2009   Qualifier: Diagnosis of   By: Genie CMA LEODIS), Chick      IMO SNOMED Dx Update Oct 2024   Formatting of this note might be different from the original. Qualifier: Diagnosis of By: Kowalk CMA LEODIS), Chick     History of squamous cell carcinoma in situ (SCCIS) of skin    05/ 2017  nasal bridge and right medial knee   History of transient ischemic attack (TIA)    01-24-2005 and 06-12-2009   Hyperlipidemia    Hypothyroidism    Mild intermittent asthma    reacts to cats   Mixed stress and urge urinary incontinence    Presence of heart assist device (HCC) 03/25/2023   Presence of permanent cardiac  pacemaker    was put in 07/2019   Pulmonary nodule    S/P AV nodal ablation 07/28/19 07/29/2019   S/P mitral valve repair 10-23-1998  dr lansing at [facility]   for MVP and regurg. 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   Past Surgical History:  Procedure Laterality Date   APPENDECTOMY  1978   AV NODE ABLATION N/A 07/28/2019   Procedure: AV NODE ABLATION;  Surgeon: Fernande Elspeth BROCKS, MD;  Location: [facility] INVASIVE CV LAB;  Service: Cardiovascular;  Laterality: N/A;   BUBBLE STUDY  06/19/2019   Procedure: BUBBLE STUDY;  Surgeon: Mona Vinie BROCKS, MD;  Location: [facility] ENDOSCOPY;  Service: Cardiovascular;;   CARDIAC ELECTROPHYSIOLOGY MAPPING AND ABLATION  01/ 2008    at [facility] [place]    right-sided ablation atrial flutter   CARDIAC ELECTROPHYSIOLOGY STUDY AND ABLATION  03/ 2010   dr ogles at [facility]   AV node ablation and pulmonary vein isolation for atrial fib   CARDIOVERSION  06-18-2006;  07-13-2006;  10-19-2010;  10-27-2010   CATARACT EXTRACTION W/PHACO Right 04/09/2021   Procedure: CATARACT EXTRACTION PHACO AND INTRAOCULAR  LENS PLACEMENT (IOC) RIGHT 6.71 01:11.1;  Surgeon: Mittie Gaskin, MD;  Location: [facility] SURGERY CNTR;  Service: Ophthalmology;  Laterality: Right;   CATARACT EXTRACTION W/PHACO Left 04/23/2021   Procedure: CATARACT EXTRACTION PHACO AND INTRAOCULAR LENS PLACEMENT (IOC) LEFT;  Surgeon: Mittie Gaskin, MD;  Location: [facility] SURGERY CNTR;  Service: Ophthalmology;  Laterality: Left;  [place] 5.65 00:50.1   COLONOSCOPY     COLONOSCOPY WITH PROPOFOL  N/A 10/13/2017   Procedure: COLONOSCOPY WITH PROPOFOL ;  Surgeon: Therisa Bi, MD;  Location: [facility] ENDOSCOPY;  Service: Gastroenterology;  Laterality: N/A;   COLONOSCOPY WITH PROPOFOL  N/A 02/20/2020   Procedure: COLONOSCOPY WITH PROPOFOL ;  Surgeon: Maryruth Ole DASEN, MD;  Location: [facility] ENDOSCOPY;  Service:  Endoscopy;  Laterality: N/A;   CYSTO/ TRANSURETHRAL COLLAGEN INJECTION THERAPY  07-26-2007   dr gaston   DILATION AND CURETTAGE OF UTERUS     ESOPHAGOGASTRODUODENOSCOPY (EGD) WITH PROPOFOL  N/A 10/13/2017   Procedure: ESOPHAGOGASTRODUODENOSCOPY (EGD) WITH PROPOFOL ;  Surgeon: Therisa Bi, MD;  Location: [facility] ENDOSCOPY;  Service: Gastroenterology;  Laterality: N/A;   EVALUATION UNDER ANESTHESIA WITH HEMORRHOIDECTOMY N/A 04/09/2020   Procedure: EXAM UNDER ANESTHESIA WITH HEMORRHOIDECTOMY;  Surgeon: Jordis Laneta FALCON, MD;  Location: [facility] ORS;  Service: General;  Laterality: N/A;   EXCISIONAL HEMORRHOIDECTOMY  1980s   GIVENS CAPSULE STUDY N/A 12/08/2017   Procedure: GIVENS CAPSULE STUDY;  Surgeon: Therisa Bi, MD;  Location: [facility] ENDOSCOPY;  Service: Gastroenterology;  Laterality: N/A;   HEMORRHOID SURGERY N/A 10/29/2016   Procedure: HEMORRHOIDECTOMY;  Surgeon: Debby Hila, MD;  Location: [facility];  Service: General;  Laterality: N/A;   MITRAL VALVE ANNULOPLASTY  10/23/1998   Model [id number]; [place] [id number]; size 32mm; [facility]; Dr. Lansing   PACEMAKER IMPLANT N/A 07/28/2019   Procedure: PACEMAKER IMPLANT;  Surgeon: Fernande Elspeth BROCKS, MD;  Location: [facility] INVASIVE CV LAB;  Service: Cardiovascular;  Laterality: N/A;   PILONIDAL CYST EXCISION  1954   RIGHT/LEFT HEART CATH AND CORONARY ANGIOGRAPHY Bilateral 09/21/2022   Procedure: RIGHT/LEFT HEART CATH AND CORONARY ANGIOGRAPHY;  Surgeon: Darron Deatrice LABOR, MD;  Location: [facility] INVASIVE CV LAB;  Service: Cardiovascular;  Laterality: Bilateral;   TEE WITH CARDIOVERSION  05-06-2006 at [facility];  01-02-2013 at [facility]   TEE WITHOUT CARDIOVERSION N/A 06/19/2019   Procedure: TRANSESOPHAGEAL ECHOCARDIOGRAM (TEE);  Surgeon: Mona Vinie BROCKS, MD;  Location: [facility] ENDOSCOPY;  Service: Cardiovascular;  Laterality: N/A;   TOTAL HIP ARTHROPLASTY Left 05/04/2017   Procedure: LEFT TOTAL HIP ARTHROPLASTY ANTERIOR APPROACH;  Surgeon: Vernetta Lonni GRADE, MD;   Location: [facility] OR;  Service: Orthopedics;  Laterality: Left;   TRANSTHORACIC ECHOCARDIOGRAM  05-01-2015   dr fernande   ef 50-55%/  mild AV sclerosis without stenosis/  post MV repair with mild central MR (valve area by pressure half-time 2cm^2,  valve area by continutity equation 0.91cm^2, peak grandiant 57mmHg)/  severe LAE/ mild TR/ mild RAE    TUBAL LIGATION Bilateral 1978   Patient Active Problem List   Diagnosis Date Noted   Medication management 11/18/2023   DDD (degenerative disc disease), lumbar 10/27/2023   Leg pain, bilateral 10/25/2023   Muscle cramps 10/19/2023   Decreased GFR 10/19/2023   Memory change 10/18/2023   Nonrheumatic mitral valve regurgitation 09/21/2022   Other fatigue 10/08/2021   Salt craving 06/25/2021   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/20/2020   Hearing loss 06/13/2020   S/P placement of cardiac pacemaker MDT 07/28/19 07/29/2019   History of CVA (cerebrovascular accident) 06/16/2019   Facial tingling 11/29/2018   Tremor of left hand 11/29/2018  Iron  deficiency anemia 10/21/2017   Constipation 08/02/2017   Numbness in feet 07/14/2017   Hip osteoarthritis 04/27/2017   Long term (current) use of anticoagulants - on coumadin  for afib 03/04/2017   Venous stasis dermatitis of both lower extremities 01/08/2017   Osteopenia 10/25/2016   Pedal edema 08/26/2016   Varicose veins of both lower extremities 08/26/2016   Estrogen deficiency 08/26/2016   Hemorrhoids 08/26/2016   History of nonmelanoma skin cancer 01/01/2016   Pruritus 10/04/2015   Hip pain 08/02/2014   Left knee pain 08/02/2014   Chronic cough 05/08/2014   Fatigue 08/16/2013   Left ovarian cyst 03/14/2013   (HFpEF) heart failure with preserved ejection fraction (HCC) 12/27/2012   PULMONARY NODULE 12/20/2008   Ganglion 10/04/2007   Mixed incontinence 04/28/2007   Hyperlipidemia 04/27/2007   Depression with anxiety 04/27/2007   Asthma, mild intermittent 04/27/2007   Insomnia 04/27/2007   Acquired  hypothyroidism 09/02/2006    ONSET DATE: 10/19/2023  REFERRING DIAG:  R25.2 (ICD-10-CM) - Muscle cramps  M51.369 (ICD-10-CM) - Degeneration of intervertebral disc of lumbar region, unspecified whether pain present    THERAPY DIAG:  ***  No diagnosis found.  Rationale for Evaluation and Treatment: Rehabilitation  SUBJECTIVE:                                                                                                                                                                                             SUBJECTIVE STATEMENT:  ***R hip weakness?     From Initial Eval: Pt states I don't really know what I'm getting rehab for, I'm not stumbling around. Pt states she lives at home, independently, by herself with no concerns.  Pt states I got up one morning and the muscles in the back of her legs locked up and it was very painful from her knees down. Pt states it went all the way up to her hips. Pt states she hasn't had that same sensation/episode since then and it is not present now. Pt states her R hip every once in a while will catch but it isn't debilitating. Pt states I carry this cane just in case she has imbalance. Pt states other than that, I'm in pretty good shape. Pt states she doesn't have shortness of breath. Pt states she does have a walker that she uses in her house when going up and down the long halls because it is ~75ft long. Pt states when she goes up/down the hall multiple times she does get tired. Pt states every once in a while she will get a catch in her back where she begins to feel it, but  she isn't cramping.  Pt states if she walks without the walker then her hip will start having pain, which is why she purchased it and started using it. Patient states within 10 minutes of activity she will start to have a little achy pain.  Pt states she is planning to purchase a smaller home in the near future where she will also be closer to her son.  Pt  states she isn't interested in doing PT 2x/week. Pt states she gets heart palpitations with increased activity and that she is in advanced stage heart failure. Pt states she doesn't want to do anything that could cause her to have a stroke. Pt states it is more important to her that she keep her mind together more than her independence with mobility.    Pt accompanied by: self  PERTINENT HISTORY: PMH: Anemia, Arthritis, A-fib, Hyperlipidemia, Hypothyroidism, Pacemaker, Nonrheumatic mitral valve regurgitation, Hip OA, per MD note CT head chronic R MCA and PCA territory infarcts and mild CHF signs on cxr  From MD note on 10/25/2023:  Pt has bilateral posterior thigh leg cramps upon rising and starting movement Feels like muscle cramps Overall improved from last visit but still present No source found in labs-reviewed today  Reassuring exam  May benefit from stretching or PT LS film ordered-pending result  (see chart for full details)  PAIN:  Are you having pain? No  PRECAUTIONS: Fall and ICD/Pacemaker  RED FLAGS: None   WEIGHT BEARING RESTRICTIONS: No  FALLS: Has patient fallen in last 6 months? No and no near falls  LIVING ENVIRONMENT: Lives with: lives alone Lives in: House/apartment - states she is planning to move closer to her son in [place]: No Has following equipment at home: Single point cane and Environmental consultant - 2 wheeled  PLOF: Independent, Independent with basic ADLs, Independent with household mobility with device, and Independent with community mobility with device  PATIENT GOALS: patient does not feel physical therapy is needed at this time, but her goal is to remain as independent as possible both mentally and physically  OBJECTIVE:  Note: Objective measures were completed at Evaluation unless otherwise noted.  DIAGNOSTIC FINDINGS:  CLINICAL DATA:  Bilateral buttock and leg pain.   EXAM: LUMBAR SPINE - 2-3 VIEW   COMPARISON:  None Available.    FINDINGS: Very mild dextroconvex curvature. Minimal grade 1 anterolisthesis L3 on L4 and L4 on L5. Alignment is otherwise anatomic. Vertebral body and disc space heights are maintained. Mild endplate degenerative changes in the mid and lower lumbar spine with facet hypertrophy.   IMPRESSION: 1. Mid and lower lumbar degenerative disc disease and facet hypertrophy. 2. Minimal grade 1 anterolisthesis of L3 on L4 and L4 on L5.     Electronically Signed   By: Newell Eke M.D.   On: 10/25/2023 16:08  COGNITION: Overall cognitive status: No family/caregiver present to determine baseline cognitive functioning; however, patient reporting same story at least ~3-4 times during session and some inconsistencies noted in her subjective report indicating potential cognitive impairments  Patient reports she uses a pill box to manage her medications. Pt reports she is very regimented in ensuring she has things in place at home.   SENSATION: WFL on light touch screen  COORDINATION: Not formally assessed  EDEMA:  Not formally assessed, but minor swelling noted in B LE lower legs during MMT testing  MUSCLE TONE: Not formally assessed  MUSCLE LENGTH: Not formally assessed  DTRs:  Not formally assessed  POSTURE: rounded shoulders  and forward head  LOWER EXTREMITY ROM:     Active   WFL for tasks assessed Right Eval Left Eval  Hip flexion    Hip extension    Hip abduction    Hip adduction    Hip internal rotation    Hip external rotation    Knee flexion    Knee extension    Ankle dorsiflexion    Ankle plantarflexion    Ankle inversion    Ankle eversion     (Blank rows = not tested)  LOWER EXTREMITY MMT:    MMT Right Eval Left Eval  Hip flexion 3+ with pain in anterior hip 4-  Hip extension    Hip abduction 4- 3+  Hip adduction 4 4  Hip internal rotation    Hip external rotation    Knee flexion 4, pt reports it as more wobbly 4  Knee extension 4 4+  Ankle  dorsiflexion 4 4  Ankle plantarflexion 4 4  Ankle inversion    Ankle eversion    (Blank rows = not tested)  Manual Muscle Test Scale 0/5 = No muscle contraction can be seen or felt 1/5 = Contraction can be felt, but there is no motion 2-/5 = Part moves through incomplete ROM w/ gravity decreased 2/5 = Part moves through complete ROM w/ gravity decreased 2+/5 = Part moves through incomplete ROM (<50%) against gravity or through complete ROM w/ gravity 3-/5 = Part moves through incomplete ROM (>50%) against gravity 3/5 = Part moves through complete ROM against gravity 3+/5 = Part moves through complete ROM against gravity/slight resistance 4-/5= Holds test position against slight to moderate pressure 4/5 = Part moves through complete ROM against gravity/moderate resistance 4+/5= Holds test position against moderate to strong pressure 5/5 = Part moves through complete ROM against gravity/full resistance  BED MOBILITY:  Not tested  TRANSFERS: Sit to stand: Complete Independence and Modified independence  Assistive device utilized: Single point cane and None     Stand to sit: Complete Independence and Modified independence  Assistive device utilized: Single point cane and None     Chair to chair: Complete Independence and Modified independence  Assistive device utilized: Single point cane and None       RAMP:  Not tested  CURB:  Not tested  STAIRS: Not tested GAIT: Findings: Gait Characteristics: WFL and step through pattern, Distance walked: ~15ft, Assistive device utilized:None, Level of assistance: Complete Independence and Modified independence, and Comments: gait into clinic using SPC and gait in clinic without AD  FUNCTIONAL TESTS:  Functional gait assessment: need to assess  10 Meter Walk Test: Patient instructed to walk 10 meters (32.8 ft) as quickly and as safely as possible at their normal speed x2 and at a fast speed x2. Time measured from 2 meter mark to 8 meter  mark to accommodate ramp-up and ramp-down.  Normal speed 1: 0.90 m/s (11 seconds) Normal speed 2: 0.947 m/s (10.55 seconds) Average Normal speed: 0.923 m/s, independently, no AD Fast speed 1: 1.18 m/s (8.45 seconds) Fast speed 2: 1.17 m/s (8.50 seconds) Average Fast speed: 1.175 m/s, independently, no AD - pt reports she is starting to feel it in her calf muscles, but isn't cramping Cut off scores: <0.4 m/s = household Ambulator, 0.4-0.8 m/s = limited community Ambulator, >0.8 m/s = community Ambulator, >1.2 m/s = crossing a street, <1.0 = increased fall risk MCID 0.05 m/s (small), 0.13 m/s (moderate), 0.06 m/s (significant)  (ANPTA Core Set of Outcome Measures for  Adults with Neurologic Conditions, 2018)   Five times Sit to Stand Test (FTSS) "Stand up and sit down as quickly as possible 5 times, keeping your arms folded across your chest."    TIME: 16.3 seconds, no UE support  Times > 13.6 seconds is associated with increased disability and morbidity (Guralnik, 2000) Times > 15 seconds is predictive of recurrent falls in healthy individuals aged 72 and older (Buatois, et al., 2008) Normal performance values in community dwelling individuals aged 12 and older (Bohannon, 2006): 60-69 years: 11.4 seconds 70-79 years: 12.6 seconds 80-89 years: 14.8 seconds  MCID: >= 2.3 seconds for Vestibular Disorders (Meretta, 2006)   PATIENT SURVEYS:  ABC scale: The Activities-Specific Balance Confidence (ABC) Scale 0% 10 20 30  40 50 60 70 80 90 100% No confidence<->completely confident  "How confident are you that you will not lose your balance or become unsteady when you . . .   Date tested 11/16/2023  Walk around the house 100%  2. Walk up or down stairs 80%  3. Bend over and pick up a slipper from in front of a closet floor 100%  4. Reach for a small can off a shelf at eye level 100%  5. Stand on tip toes and reach for something above your head 100%  6. Stand on a chair and reach for  something 80%  7. Sweep the floor 75%  8. Walk outside the house to a car parked in the driveway 100%  9. Get into or out of a car 100%  10. Walk across a parking lot to the mall 80%  11. Walk up or down a ramp 50% - patient states mostly this is challenging due to impaired cardiac endurance from heart failure  12. Walk in a crowded mall where people rapidly walk past you 70%   13. Are bumped into by people as you walk through the mall 100%  14. Step onto or off of an escalator while you are holding onto the railing 100%  15. Step onto or off an escalator while holding onto parcels such that you cannot hold onto the railing 80%  16. Walk outside on icy sidewalks 50%  Total: #/16 85.31%                                                                                                                                 TREATMENT DATE: 12/07/2023  ***  ABI (ankle brachial index) FGA May NOT want to do due to pt's concern for her heart Address R hip pain & initiate HEP for strengthening core and R hip musculature  ***  Unless otherwise stated, CGA was provided and gait belt donned in order to ensure pt safety throughout session.  Patient reports she is getting a little bit of tingling and numbness along bilateral cheek bones and up near temporal lobes following . Pt states at home she hasn't ever been paying attention to know  if she gets this symptom, but because we are doing a test she is noticing it. Pt states it isn't one of those things you woud notice if you weren't looking for something to be different. Pt correlates this symptom to walking fast during .   Patient agreeable to returning for at least 1 more visit and developing an HEP at that visit based on therapist's recommendation to address deficits that are causing patient to require use of RW to ambulate her household distances without pain.  ***   PATIENT EDUCATION: Education details: Findings from assessment,  therapy POC, benefits of physical therapy Person educated: Patient Education method: Explanation Education comprehension: verbalized understanding and needs further education  HOME EXERCISE PROGRAM: Need to provide at next visit  GOALS: Goals reviewed with patient? Yes  SHORT TERM GOALS: Target date: 12/07/2023   Patient will be independent with home exercise program to improve strength/mobility for increased functional independence with ADLs and mobility.  Baseline: need to initiate Goal status: INITIAL   LONG TERM GOALS: Target date: 12/28/2023   Patient (> 28 years old) will complete five times sit to stand test (5XSTS) in < 15 seconds indicating an increased LE strength and improved balance.  Baseline: 16.3 seconds without UE support Goal status: INITIAL  2.  Patient will increase 10 meter walk test to >1.55m/s as to improve gait speed for better community ambulation and to reduce fall risk.  Baseline: 0.923 m/s, independently, no AD Goal status: INITIAL  3.  Patient will increase Functional Gait Assessment (FGA) score to >20/30 as to reduce fall risk and improve dynamic gait safety with community ambulation.  Baseline: need to assess Goal status: INITIAL   ASSESSMENT:  CLINICAL IMPRESSION: *** Patient is an 89 y.o. female who was seen today for physical therapy treatment for an episode of muscle cramps in bilateral lower legs; however, patient reports not having any subsequent episodes of this since. Patient reports she doesn't feel she truly needs physical therapy at this time due to no specific mobility concerns; however, is agreeable to participate in evaluation and assessment of functional outcome measures to assess her fall risk. Patient demonstrates minor deficits on both 5xSTS and indicating slightly decreased gait speed and slightly impaired functional strength in B LEs. Patient reports her main mobility impairment is the recent need to start using RW in the home  due to low back and R hip pain with prolonged walking without AD support; however, patient reports she is the one who initiated her use of the AD and has been doing well with it since. Patient does express concern during therapy session of avoiding any potential mobility tasks that would cause her increased cardiac exertion due to concern for having heart failure and patient worried increased exertion will cause her to have a CVA. Patient in agreement to return for at least 1 additional physical therapy session to perform further dynamic gait assessment as well as help develop her an HEP to address potential core/hip strength deficits that are limiting her gait tolerance without AD support. The pt will benefit from further skilled PT to improve these deficits in order to increase QOL and ease/safety with ADLs.   OBJECTIVE IMPAIRMENTS: decreased activity tolerance, decreased balance, decreased cognition, decreased endurance, decreased knowledge of condition, decreased knowledge of use of DME, decreased mobility, decreased strength, and pain.   ACTIVITY LIMITATIONS: carrying, lifting, bending, squatting, and locomotion level  PARTICIPATION LIMITATIONS: cleaning, laundry, and community activity  PERSONAL FACTORS: Age, Behavior pattern, Time  since onset of injury/illness/exacerbation, and 3+ comorbidities:  Anemia, Arthritis, A-fib, Hyperlipidemia, Hypothyroidism, Pacemaker, Nonrheumatic mitral valve regurgitation, Hip OA, per MD note CT head chronic R MCA and PCA territory infarcts and mild CHF signs on cxr are also affecting patient's functional outcome.   REHAB POTENTIAL: Good  CLINICAL DECISION MAKING: Evolving/moderate complexity  EVALUATION COMPLEXITY: Moderate  PLAN:  PT FREQUENCY: 1-2x/week  PT DURATION: 6 weeks  PLANNED INTERVENTIONS: [id number]- PT Re-evaluation, 97750- Physical Performance Testing, 97110-Therapeutic exercises, 97530- Therapeutic activity, [id number]- Neuromuscular re-education,  97535- Self Care, [id number]- Manual therapy, [id number]- Gait training, [phone]- Orthotic Initial, [phone]- Orthotic/Prosthetic subsequent, [phone]- Canalith repositioning, [phone]- Electrical stimulation (manual), [phone] (1-2 muscles), [phone] (3+ muscles)- Dry Needling, Patient/Family education, Balance training, Stair training, Taping, Joint mobilization, Spinal mobilization, Vestibular training, Cognitive remediation, DME instructions, Cryotherapy, Moist heat, and Biofeedback  PLAN FOR NEXT SESSION: *** ABI (ankle brachial index) FGA May NOT want to do due to pt's concern for her heart Address R hip pain & initiate HEP for strengthening core and R hip musculature   Jshaun Abernathy, PT, DPT, NCS, CSRS Physical Therapist - [facility]  [facility]  8:56 AM 12/07/23

## 2023-12-09 ENCOUNTER — Ambulatory Visit: Admitting: Physical Therapy

## 2023-12-13 ENCOUNTER — Telehealth: Payer: Self-pay | Admitting: Family Medicine

## 2023-12-13 NOTE — Telephone Encounter (Unsigned)
 Copied from CRM [id number]. Topic: Clinical - Request for Lab/Test Order >> Dec 13, 2023 11:36 AM Donee H wrote: Reason for CRM: Patient states she normal have blood work done and hasn't had any blood work done all month. Patient would like to know if she missed something or if bloodwork can be ordered. Please follow up with patient [phone]

## 2023-12-14 ENCOUNTER — Other Ambulatory Visit: Payer: Self-pay | Admitting: Cardiology

## 2023-12-15 ENCOUNTER — Ambulatory Visit

## 2023-12-15 NOTE — Progress Notes (Signed)
 Remote PPM Transmission

## 2023-12-15 NOTE — Telephone Encounter (Signed)
 Spoke with pt to get clarification of question concerning blood work. Pt states she was used to getting INR checked regularly but now she's on Eliquis  and doesn't need blood work. However, pt expresses her thanks for call back. Nothing else needed at this time.

## 2023-12-16 DIAGNOSIS — Z85828 Personal history of other malignant neoplasm of skin: Secondary | ICD-10-CM | POA: Diagnosis not present

## 2023-12-16 DIAGNOSIS — L309 Dermatitis, unspecified: Secondary | ICD-10-CM | POA: Diagnosis not present

## 2023-12-16 DIAGNOSIS — L821 Other seborrheic keratosis: Secondary | ICD-10-CM | POA: Diagnosis not present

## 2023-12-16 DIAGNOSIS — L820 Inflamed seborrheic keratosis: Secondary | ICD-10-CM | POA: Diagnosis not present

## 2023-12-17 ENCOUNTER — Ambulatory Visit: Admitting: Physical Therapy

## 2023-12-20 ENCOUNTER — Ambulatory Visit: Admitting: Physical Therapy

## 2023-12-22 ENCOUNTER — Ambulatory Visit

## 2023-12-27 ENCOUNTER — Ambulatory Visit: Admitting: Physical Therapy

## 2023-12-29 ENCOUNTER — Ambulatory Visit

## 2023-12-29 ENCOUNTER — Telehealth: Payer: Self-pay | Admitting: *Deleted

## 2023-12-29 NOTE — Telephone Encounter (Signed)
 Copied from CRM #[id number]. Topic: Clinical - Medical Advice >> Dec 29, 2023  9:51 AM [name] wrote: Reason for CRM: Patient called in to advise Dr. Randeen right leg is ready to give and out and would like to know if she think it's ready for an hip replacement. Please call [phone]

## 2023-12-30 ENCOUNTER — Telehealth: Payer: Self-pay | Admitting: Family Medicine

## 2023-12-30 DIAGNOSIS — M25551 Pain in right hip: Secondary | ICD-10-CM

## 2023-12-30 NOTE — Telephone Encounter (Signed)
 Not sure, that would be up to her orthopedist  Is her pain in the groin or lateral hip or lower leg ?   They will need to do imaging most likely  I think she saw Dr Vernetta in the past- would she like a referral

## 2023-12-30 NOTE — Telephone Encounter (Signed)
 Pt came in to leave a message for  dr tower pt needs to get appt with the doctor who did her first hop replacement if pt could get a call back to discuss  the matter [phone]

## 2023-12-31 ENCOUNTER — Encounter: Payer: Self-pay | Admitting: Family Medicine

## 2023-12-31 DIAGNOSIS — M25551 Pain in right hip: Secondary | ICD-10-CM | POA: Insufficient documentation

## 2023-12-31 NOTE — Telephone Encounter (Signed)
 I put the referral in for Dr Blackman/orthopedics

## 2023-12-31 NOTE — Telephone Encounter (Signed)
 Pt was notified, see other phone note regarding this

## 2024-01-03 ENCOUNTER — Ambulatory Visit: Admitting: Physical Therapy

## 2024-01-04 NOTE — Telephone Encounter (Signed)
 Appt scheduled with ortho

## 2024-01-06 ENCOUNTER — Ambulatory Visit: Admitting: Physical Therapy

## 2024-01-10 ENCOUNTER — Emergency Department (HOSPITAL_COMMUNITY)

## 2024-01-10 ENCOUNTER — Other Ambulatory Visit: Payer: Self-pay

## 2024-01-10 ENCOUNTER — Ambulatory Visit: Admitting: Physical Therapy

## 2024-01-10 ENCOUNTER — Emergency Department (HOSPITAL_COMMUNITY)
Admission: EM | Admit: 2024-01-10 | Discharge: 2024-01-10 | Disposition: A | Discharge status: Home / Self Care | Attending: Emergency Medicine | Admitting: Emergency Medicine

## 2024-01-10 DIAGNOSIS — H8111 Benign paroxysmal vertigo, right ear: Secondary | ICD-10-CM | POA: Insufficient documentation

## 2024-01-10 DIAGNOSIS — R42 Dizziness and giddiness: Secondary | ICD-10-CM | POA: Diagnosis not present

## 2024-01-10 DIAGNOSIS — R930 Abnormal findings on diagnostic imaging of skull and head, not elsewhere classified: Secondary | ICD-10-CM | POA: Diagnosis not present

## 2024-01-10 DIAGNOSIS — Z8673 Personal history of transient ischemic attack (TIA), and cerebral infarction without residual deficits: Secondary | ICD-10-CM

## 2024-01-10 DIAGNOSIS — Z7901 Long term (current) use of anticoagulants: Secondary | ICD-10-CM | POA: Diagnosis not present

## 2024-01-10 LAB — COMPREHENSIVE METABOLIC PANEL WITH GFR
ALT: 14 U/L (ref 0–44)
AST: 19 U/L (ref 15–41)
Albumin: 3.7 g/dL (ref 3.5–5.0)
Alkaline Phosphatase: 37 U/L — ABNORMAL LOW (ref 38–126)
Anion gap: 10 (ref 5–15)
BUN: 19 mg/dL (ref 8–23)
CO2: 27 mmol/L (ref 22–32)
Calcium: 9.1 mg/dL (ref 8.9–10.3)
Chloride: 107 mmol/L (ref 98–111)
Creatinine, Ser: 0.99 mg/dL (ref 0.44–1.00)
GFR, Estimated: 55 mL/min — ABNORMAL LOW (ref >60)
Glucose, Bld: 92 mg/dL (ref 70–99)
Potassium: 4.1 mmol/L (ref 3.5–5.1)
Sodium: 144 mmol/L (ref 135–145)
Total Bilirubin: 0.8 mg/dL (ref 0.0–1.2)
Total Protein: 7.2 g/dL (ref 6.5–8.1)

## 2024-01-10 LAB — URINALYSIS, ROUTINE W REFLEX MICROSCOPIC
Bilirubin Urine: NEGATIVE
Glucose, UA: >=500 mg/dL — AB
Ketones, ur: NEGATIVE mg/dL
Nitrite: NEGATIVE
Protein, ur: NEGATIVE mg/dL
Specific Gravity, Urine: 1.024 (ref 1.005–1.030)
pH: 5 (ref 5.0–8.0)

## 2024-01-10 LAB — CBC
HCT: 40.7 % (ref 36.0–46.0)
Hemoglobin: 12.9 g/dL (ref 12.0–15.0)
MCH: 29.5 pg (ref 26.0–34.0)
MCHC: 31.7 g/dL (ref 30.0–36.0)
MCV: 93.1 fL (ref 80.0–100.0)
Platelets: 137 K/uL — ABNORMAL LOW (ref 150–400)
RBC: 4.37 MIL/uL (ref 3.87–5.11)
RDW: 14.6 % (ref 11.5–15.5)
WBC: 3.8 K/uL — ABNORMAL LOW (ref 4.0–10.5)
nRBC: 0 % (ref 0.0–0.2)

## 2024-01-10 LAB — PROTIME-INR
INR: 1.2 (ref 0.8–1.2)
Prothrombin Time: 16 s — ABNORMAL HIGH (ref 11.4–15.2)

## 2024-01-10 MED ORDER — MECLIZINE HCL 25 MG PO TABS
25.0000 mg | ORAL_TABLET | Freq: Once | ORAL | Status: AC
  Administered 2024-01-10: 25 mg via ORAL
  Filled 2024-01-10: qty 1

## 2024-01-10 MED ORDER — IOHEXOL 350 MG/ML SOLN
60.0000 mL | Freq: Once | INTRAVENOUS | Status: AC | PRN
  Administered 2024-01-10: 60 mL via INTRAVENOUS

## 2024-01-10 MED ORDER — MECLIZINE HCL 12.5 MG PO TABS: 12.5000 mg | ORAL_TABLET | Freq: Three times a day (TID) | ORAL | 0 refills | Status: AC | PRN

## 2024-01-10 NOTE — ED Triage Notes (Signed)
 Patient arrives via [facility] from home for dizziness and vertigo. Endorses symptoms began in bed, feels like falling. Patient endorses doing maneuvers at home that usually are effective without relief. No CP, diaphoresis. Paced HR 90, BP 140/70, CBG 101, 98 on room air. Alert and oriented x4. No nausea and vomiting.

## 2024-01-10 NOTE — Discharge Instructions (Signed)
 You were seen for your vertigo in the emergency department.   At home, please perform the Epley maneuver and take the meclizine  for your dizziness.    Check your MyChart online for the results of any tests that had not resulted by the time you left the emergency department.   Follow-up with your primary doctor in 2-3 days regarding your visit.  Follow-up with neurology about your head CT.  Follow-up with ear nose and throat doctors if you have persistent vertigo.  Return immediately to the emergency department if you experience any of the following: Inability to walk, worsening dizziness, or any other concerning symptoms.    Thank you for visiting our Emergency Department. It was a pleasure taking care of you today.

## 2024-01-10 NOTE — CV Procedure (Signed)
 This patients pacemaker is not compatible with mri at this time. [employer] tablet would not let the technologist proceed. [employer] reps were not able to bypass for the scan either. The patient has a lead pin prod and unable to be put in MRI mode due to this.

## 2024-01-10 NOTE — ED Notes (Signed)
 Pt to MRI

## 2024-01-10 NOTE — ED Provider Notes (Signed)
 [facility] EMERGENCY DEPARTMENT AT [facility] Provider Note   CSN: [id number] Arrival date & time: 01/10/24  1134     Patient presents with: Dizziness   [name] is a 89 y.o. female.   89 year old female with a history of vertigo, bradycardia status post pacemaker, hyperlipidemia, and carotid stenosis who presents emergency department dizziness.  Patient reports that at 6 AM this morning she was in bed and she woke up and felt like the room was spinning.  Felt like she was going to fall out of bed.  Attempted to sit up but made the dizziness worse.  Says that she still has mild vertiginous sensation.  Has not taken any medications for this but because she is unable to walk decided to come into the emergency department. Does have a history of vertigo and has had similar episodes over the past 2 days       Prior to Admission medications   Medication Sig Start Date End Date Taking? Authorizing Provider  meclizine  (ANTIVERT ) 12.5 MG tablet Take 1 tablet (12.5 mg total) by mouth 3 (three) times daily as needed for dizziness. 01/10/24  Yes Yolande Lamar BROCKS, MD  acetaminophen  (TYLENOL ) 325 MG tablet Take 650 mg by mouth 2 (two) times daily as needed for moderate pain (pain score 4-6), fever or headache.    [provider]  albuterol  (VENTOLIN  HFA) 108 (90 Base) MCG/ACT inhaler Inhale 1-2 puffs into the lungs every 6 (six) hours as needed for wheezing or shortness of breath. 09/28/22   Tower, Laine LABOR, MD  apixaban  (ELIQUIS ) 5 MG TABS tablet Take 1 tablet (5 mg total) by mouth 2 (two) times daily. 11/18/23   Tower, Laine LABOR, MD  augmented betamethasone dipropionate (DIPROLENE-AF) 0.05 % cream Apply 1 Application topically 2 (two) times daily. 12/16/23   [provider]  bisoprolol  (ZEBETA ) 5 MG tablet Take 1 tablet (5 mg total) by mouth daily. 03/08/23   Sabharwal, Aditya, DO  empagliflozin  (JARDIANCE ) 10 MG TABS tablet Take 1 tablet (10 mg total) by mouth daily  before breakfast. PLEASE SCHEDULE APPOINTMENT FOR MORE REFILLS 12/15/23   Sabharwal, Aditya, DO  famotidine  (PEPCID ) 20 MG tablet Take 1 tablet (20 mg total) by mouth 2 (two) times daily. 09/02/23   Tower, Laine LABOR, MD  ferrous sulfate  325 (65 FE) MG EC tablet Take 1 tablet (325 mg total) by mouth daily with breakfast. 06/25/23   Tower, Laine LABOR, MD  fluorouracil (EFUDEX) 5 % cream Apply 1 Application topically daily. 11/23/23   [provider]  fluoruracil (CARAC) 0.5 % cream Apply topically daily.    [provider]  fluticasone  (FLONASE ) 50 MCG/ACT nasal spray Place 1 spray into both nostrils 2 (two) times daily as needed for allergies or rhinitis. 05/27/22   Tower, Laine LABOR, MD  furosemide  (LASIX ) 40 MG tablet Take 1 tablet (40 mg total) by mouth daily. 10/13/22 03/24/25  Sabharwal, Ria, DO  levothyroxine  (SYNTHROID ) 75 MCG tablet TAKE 1 TABLET EVERY DAY BEFORE BREAKFAST Patient not taking: Reported on 12/01/2023 12/29/22   Tower, Laine LABOR, MD  losartan  (COZAAR ) 25 MG tablet TAKE 1 TABLET (25 MG TOTAL) BY MOUTH DAILY. 07/06/23   Sabharwal, Aditya, DO  mexiletine (MEXITIL ) 200 MG capsule TAKE 1 CAPSULE BY MOUTH TWICE A DAY 08/31/23   Fernande Elspeth BROCKS, MD  sertraline  (ZOLOFT ) 25 MG tablet TAKE 1 TABLET (25 MG TOTAL) BY MOUTH DAILY. IN [facility] 11/18/23   Tower, Laine LABOR, MD  spironolactone  (ALDACTONE ) 25  MG tablet Take 0.5 tablets (12.5 mg total) by mouth daily. 04/09/23   Fernande Elspeth BROCKS, MD  UNABLE TO FIND Take 1 tablet by mouth daily. Unknown OTC allergy medication, red tablet (not pseudoephedrine).    [provider]    Allergies: Pacerone [amiodarone], Penicillins, Zetia  [ezetimibe ], Lopressor  [metoprolol ], and Statins    Review of Systems  Updated Vital Signs BP 139/72   Pulse 70   Temp 98.5 F (36.9 C) (Oral)   Resp 16   Ht 5' 5 (1.651 m)   Wt 81.2 kg   SpO2 94%   BMI 29.79 kg/m   Physical Exam Constitutional:      Appearance: Normal appearance.  Eyes:      Extraocular Movements: Extraocular movements intact.     Conjunctiva/sclera: Conjunctivae normal.     Pupils: Pupils are equal, round, and reactive to light.  Cardiovascular:     Rate and Rhythm: Normal rate and regular rhythm.     Pulses: Normal pulses.     Heart sounds: Normal heart sounds.  Pulmonary:     Effort: Pulmonary effort is normal.     Breath sounds: Normal breath sounds.  Neurological:     Mental Status: She is alert.     Comments: MENTAL STATUS: AAOx3 CRANIAL NERVES: II: Pupils equal and reactive 4 mm BL, no RAPD, no VF deficits III, IV, VI: EOM intact, no gaze preference or deviation, no nystagmus. V: normal sensation to light touch in V1, V2, and V3 segments bilaterally VII: no facial weakness or asymmetry, no nasolabial fold flattening VIII: normal hearing to speech and finger friction IX, X: normal palatal elevation, no uvular deviation XI: 5/5 head turn and 5/5 shoulder shrug bilaterally XII: midline tongue protrusion MOTOR: 5/5 strength in R shoulder flexion, elbow flexion and extension, and grip strength. 5/5 strength in L shoulder flexion, elbow flexion and extension, and grip strength.  5/5 strength in R hip and knee flexion, knee extension, ankle plantar and dorsiflexion. 5/5 strength in L hip and knee flexion, knee extension, ankle plantar and dorsiflexion. SENSORY: Normal sensation to light touch in all extremities COORD: Normal finger to nose and heel to shin, no tremor, no dysmetria No aphasia or neglect   Dix-Hallpike maneuver positive to the right     (all labs ordered are listed, but only abnormal results are displayed) Labs Reviewed  COMPREHENSIVE METABOLIC PANEL WITH GFR - Abnormal; Notable for the following components:      Result Value   Alkaline Phosphatase 37 (*)    GFR, Estimated 55 (*)    All other components within normal limits  CBC - Abnormal; Notable for the following components:   WBC 3.8 (*)    Platelets 137 (*)    All other  components within normal limits  URINALYSIS, ROUTINE W REFLEX MICROSCOPIC - Abnormal; Notable for the following components:   APPearance HAZY (*)    Glucose, UA >=500 (*)    Hgb urine dipstick SMALL (*)    Leukocytes,Ua TRACE (*)    Bacteria, UA RARE (*)    All other components within normal limits  PROTIME-INR - Abnormal; Notable for the following components:   Prothrombin Time 16.0 (*)    All other components within normal limits  CBG MONITORING, ED    EKG: EKG Interpretation Date/Time:  Monday January 10 2024 12:04:19 EDT Ventricular Rate:  71 PR Interval:    QRS Duration:  184 QT Interval:  462 QTC Calculation: 502 R Axis:   -86  Text  Interpretation: Ventricular-paced rhythm Abnormal ECG When compared with ECG of 05-Oct-2023 21:58, PREVIOUS ECG IS PRESENT Confirmed by Yolande Charleston [phone]) on 01/10/2024 12:42:05 PM  Radiology: CT ANGIO HEAD NECK W WO CM Result Date: 01/10/2024 CLINICAL DATA:  Dizziness EXAM: CT ANGIOGRAPHY HEAD AND NECK WITH AND WITHOUT CONTRAST TECHNIQUE: Multidetector CT imaging of the head and neck was performed using the standard protocol during bolus administration of intravenous contrast. Multiplanar CT image reconstructions and MIPs were obtained to evaluate the vascular anatomy. Carotid stenosis measurements (when applicable) are obtained utilizing NASCET criteria, using the distal internal carotid diameter as the denominator. RADIATION DOSE REDUCTION: This exam was performed according to the departmental dose-optimization program which includes automated exposure control, adjustment of the mA and/or kV according to patient size and/or use of iterative reconstruction technique. CONTRAST:  60mL OMNIPAQUE  IOHEXOL  350 MG/ML SOLN COMPARISON:  MRI June 16, 2019, CT October 06, 2023 FINDINGS: CT HEAD: There is an old right posterior frontal cortical infarct. There is a small old left occipital cortical infarct There is no hemorrhage. No acute ischemic changes. No  mass lesion. The ventricles are normal. Skull/sinuses/orbits: No significant abnormality. CTA NECK: CTA NECK Aortic arch: No proximal vessel stenosis. Right carotid: There is some calcified plaque with no stenosis. Left carotid: There is calcified plaque with no stenosis. Right vertebral: Normal Left vertebral: There is calcium  at the origin.  No stenosis Soft tissues: No significant abnormality Other comments: None CTA HEAD: CTA HEAD Right anterior circulation: The internal carotid artery is patent without significant stenosis. The anterior and middle cerebral arteries are patent without significant stenosis or proximal branch occlusion. No aneurysm. Left anterior circulation: The internal carotid artery is patent without significant stenosis. The anterior and middle cerebral arteries are patent without significant stenosis or proximal branch occlusion. No aneurysm. Posterior circulation: Both vertebral arteries are patent. There is no significant basilar stenosis. Both posterior cerebral arteries are patent without significant stenosis or proximal branch occlusion. No aneurysm. The left posterior cerebral artery arises from the carotid primarily. IMPRESSION: 1. No extracranial carotid artery stenosis 2. No significant vertebrobasilar or intracranial disease 3. 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   On: 01/10/2024 15:47     Procedures   Medications Ordered in the ED  meclizine  (ANTIVERT ) tablet 25 mg (25 mg Oral Given 01/10/24 1522)  iohexol  (OMNIPAQUE ) 350 MG/ML injection 60 mL (60 mLs Intravenous Contrast Given 01/10/24 1533)    Clinical Course as of 01/10/24 1740  Mon Jan 10, 2024  1507 Pacemaker is unfortunately not MRI compatible [RP]    Clinical Course User Index [RP] Yolande Charleston BROCKS, MD                                 Medical Decision Making Amount and/or Complexity of Data Reviewed Labs:  ordered. Radiology: ordered.  Risk Prescription drug management.   [name] is a 89 year old female with a history of vertigo, bradycardia status post pacemaker, hyperlipidemia, and carotid stenosis who presents emergency department dizziness.   Initial Ddx:  Vertigo, stroke, ich, brain mass, BPPV   MDM/Course:  Patient presents emergency department with a vertiginous sensation.  Has a nonfocal neurologic exam aside from being unable walk. Dix-Hallpike maneuver was positive on the right with observed rotary nystagmus.  She had a Epley maneuver performed afterwards which improved her symptoms but was  still having some mild dizziness with movement.  An MRI was ordered but unfortunately because of her pacemaker she is not a candidate for MRI at this point in time.  She had a CTA which showed patent vertebral arteries with old infarcts in the posterior frontal lobe and left occipital lobe but no cerebellar infarcts.  After meclizine  and is able to get up and walk without assistance aside from a walker which she typically uses.  Reported that the dizziness is significantly improved.  Given her workup today I suspect that she likely has BPPV especially with her positive Dix-Hallpike on the right and the intermittent symptoms.  She also had an MRI in 2020 but she had a different pacemaker and had a similar event which did not show evidence of stroke which would argue against stroke as well.  Family was updated regarding the findings today.  They will take the patient home.  She was given a prescription of meclizine  and instructed to follow-up with her primary doctor, ENT, and neurology.  This patient presents to the ED for concern of complaints listed in HPI, this involves an extensive number of treatment options, and is a complaint that carries with it a high risk of complications and morbidity. Disposition including potential need for admission considered.   Dispo: DC Home. Return precautions  discussed including, but not limited to, those listed in the AVS. Allowed pt time to ask questions which were answered fully prior to dc.  Additional history obtained from son Records reviewed ED Visit Notes The following labs were independently interpreted: Chemistry and show no acute abnormality I independently reviewed the following imaging with scope of interpretation limited to determining acute life threatening conditions related to emergency care: CT Head and agree with the radiologist interpretation with the following exceptions: none I personally reviewed and interpreted cardiac monitoring: Paced rhythm I personally reviewed and interpreted the pt's EKG: see above for interpretation  I have reviewed the patients home medications and made adjustments as needed Social Determinants of health:  Geriatric  Portions of this note were generated with Scientist, clinical (histocompatibility and immunogenetics). Dictation errors may occur despite best attempts at proofreading.     Final diagnoses:  Vertigo  Abnormal head CT  Benign paroxysmal positional vertigo of right ear    ED Discharge Orders          Ordered    meclizine  (ANTIVERT ) 12.5 MG tablet  3 times daily PRN        01/10/24 1710               Yolande Lamar BROCKS, MD 01/10/24 1742

## 2024-01-12 ENCOUNTER — Ambulatory Visit: Admitting: Physical Therapy

## 2024-01-16 ENCOUNTER — Other Ambulatory Visit: Payer: Self-pay | Admitting: Family Medicine

## 2024-01-17 ENCOUNTER — Telehealth: Payer: Self-pay

## 2024-01-17 ENCOUNTER — Ambulatory Visit: Admitting: Physical Therapy

## 2024-01-17 DIAGNOSIS — F418 Other specified anxiety disorders: Secondary | ICD-10-CM

## 2024-01-19 ENCOUNTER — Ambulatory Visit: Admitting: Physical Therapy

## 2024-01-19 ENCOUNTER — Inpatient Hospital Stay: Admitting: Family Medicine

## 2024-01-21 ENCOUNTER — Ambulatory Visit

## 2024-01-21 DIAGNOSIS — I482 Chronic atrial fibrillation, unspecified: Secondary | ICD-10-CM | POA: Diagnosis not present

## 2024-01-22 LAB — CUP PACEART REMOTE DEVICE CHECK
Battery Remaining Longevity: 84 mo
Battery Voltage: 2.99 V
Brady Statistic AP VP Percent: 0 %
Brady Statistic AP VS Percent: 0 %
Brady Statistic AS VP Percent: 99.4 %
Brady Statistic AS VS Percent: 0.6 %
Brady Statistic RA Percent Paced: 0 %
Brady Statistic RV Percent Paced: 99.4 %
Date Time Interrogation Session: 20251030231119
Implantable Lead Connection Status: 753985
Implantable Lead Implant Date: 20210507
Implantable Lead Location: 753860
Implantable Lead Model: 5076
Implantable Pulse Generator Implant Date: 20210507
Lead Channel Impedance Value: 3306 Ohm
Lead Channel Impedance Value: 3306 Ohm
Lead Channel Impedance Value: 380 Ohm
Lead Channel Impedance Value: 418 Ohm
Lead Channel Pacing Threshold Amplitude: 0.875 V
Lead Channel Pacing Threshold Pulse Width: 0.4 ms
Lead Channel Sensing Intrinsic Amplitude: 1.875 mV
Lead Channel Sensing Intrinsic Amplitude: 12.75 mV
Lead Channel Sensing Intrinsic Amplitude: 8.625 mV
Lead Channel Setting Pacing Amplitude: 2.5 V
Lead Channel Setting Pacing Pulse Width: 0.4 ms
Lead Channel Setting Sensing Sensitivity: 4 mV
Zone Setting Status: 755011

## 2024-01-24 ENCOUNTER — Encounter: Payer: Self-pay | Admitting: Family Medicine

## 2024-01-24 ENCOUNTER — Ambulatory Visit (INDEPENDENT_AMBULATORY_CARE_PROVIDER_SITE_OTHER): Admitting: Family Medicine

## 2024-01-24 ENCOUNTER — Ambulatory Visit: Admitting: Physical Therapy

## 2024-01-24 VITALS — BP 124/58 | HR 94 | Temp 97.8°F | Ht 65.0 in | Wt 178.2 lb

## 2024-01-24 DIAGNOSIS — Z8673 Personal history of transient ischemic attack (TIA), and cerebral infarction without residual deficits: Secondary | ICD-10-CM

## 2024-01-24 DIAGNOSIS — R42 Dizziness and giddiness: Secondary | ICD-10-CM | POA: Insufficient documentation

## 2024-01-24 NOTE — Progress Notes (Signed)
 Subjective:    Patient ID: [name], female    DOB: 08/14/1932, 89 y.o.   MRN: [id number]  HPI  Wt Readings from Last 3 Encounters:  01/24/24 178 lb 4 oz (80.9 kg)  01/10/24 179 lb (81.2 kg)  11/18/23 178 lb 2 oz (80.8 kg)   29.66 kg/m  Vitals:   01/24/24 1055  BP: (!) 124/58  Pulse: 94  Temp: 97.8 F (36.6 C)  SpO2: 97%    Pt presents for follow up of  ER visit on 10/20 for vertigo   Presented with vertiginous type of dizziness  Dix hallpike man was positive on right for rotary nystagmus  Epley maneuver helped her symptoms   Could not do MRI due to pacer  Had CTA noting patent vertebral arteries, old infarcts   Meclizine  helped her symptoms    Labs Lab Results  Component Value Date   WBC 3.8 (L) 01/10/2024   HGB 12.9 01/10/2024   HCT 40.7 01/10/2024   MCV 93.1 01/10/2024   PLT 137 (L) 01/10/2024   Lab Results  Component Value Date   INR 1.2 01/10/2024   INR 2.5 11/18/2023   INR 3.2 (A) 11/11/2023   Lab Results  Component Value Date   NA 144 01/10/2024   K 4.1 01/10/2024   CO2 27 01/10/2024   GLUCOSE 92 01/10/2024   BUN 19 01/10/2024   CREATININE 0.99 01/10/2024   CALCIUM  9.1 01/10/2024   GFR 56.89 (L) 10/19/2023   EGFR 54 (L) 01/26/2023   GFRNONAA 55 (L) 01/10/2024   Lab Results  Component Value Date   ALT 14 01/10/2024   AST 19 01/10/2024   ALKPHOS 37 (L) 01/10/2024   BILITOT 0.8 01/10/2024     CUP PACEART REMOTE DEVICE CHECK Result Date: 01/22/2024 PPM Scheduled remote reviewed. Normal device function.  Presenting rhythm: VP, VVIR.  1 VHR detection consistent with 6 beat NSVT. Next remote transmission per protocol. - CS, CVRS  CT ANGIO HEAD NECK W WO CM Result Date: 01/10/2024 CLINICAL DATA:  Dizziness EXAM: CT ANGIOGRAPHY HEAD AND NECK WITH AND WITHOUT CONTRAST TECHNIQUE: Multidetector CT imaging of the head and neck was performed using the standard protocol during bolus administration of intravenous contrast. Multiplanar CT  image reconstructions and MIPs were obtained to evaluate the vascular anatomy. Carotid stenosis measurements (when applicable) are obtained utilizing NASCET criteria, using the distal internal carotid diameter as the denominator. RADIATION DOSE REDUCTION: This exam was performed according to the departmental dose-optimization program which includes automated exposure control, adjustment of the mA and/or kV according to patient size and/or use of iterative reconstruction technique. CONTRAST:  60mL OMNIPAQUE  IOHEXOL  350 MG/ML SOLN COMPARISON:  MRI June 16, 2019, CT October 06, 2023 FINDINGS: CT HEAD: There is an old right posterior frontal cortical infarct. There is a small old left occipital cortical infarct There is no hemorrhage. No acute ischemic changes. No mass lesion. The ventricles are normal. Skull/sinuses/orbits: No significant abnormality. CTA NECK: CTA NECK Aortic arch: No proximal vessel stenosis. Right carotid: There is some calcified plaque with no stenosis. Left carotid: There is calcified plaque with no stenosis. Right vertebral: Normal Left vertebral: There is calcium  at the origin.  No stenosis Soft tissues: No significant abnormality Other comments: None CTA HEAD: CTA HEAD Right anterior circulation: The internal carotid artery is patent without significant stenosis. The anterior and middle cerebral arteries are patent without significant stenosis or proximal branch occlusion. No aneurysm. Left anterior circulation: The internal carotid artery is patent  without significant stenosis. The anterior and middle cerebral arteries are patent without significant stenosis or proximal branch occlusion. No aneurysm. Posterior circulation: Both vertebral arteries are patent. There is no significant basilar stenosis. Both posterior cerebral arteries are patent without significant stenosis or proximal branch occlusion. No aneurysm. The left posterior cerebral artery arises from the carotid primarily. IMPRESSION:  1. No extracranial carotid artery stenosis 2. No significant vertebrobasilar or intracranial disease 3. 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   On: 01/10/2024 15:47    Today Overall feels better than she did in the ER When she sits up in bed- has some mild dizziness/ has to take some time   Has some ringing in right ear   Epley maneuver (herself) has helped in the past   Has not needed mecline       Patient Active Problem List   Diagnosis Date Noted   Vertigo 01/24/2024   Right hip pain 12/31/2023   Medication management 11/18/2023   DDD (degenerative disc disease), lumbar 10/27/2023   Leg pain, bilateral 10/25/2023   Muscle cramps 10/19/2023   Decreased GFR 10/19/2023   Memory change 10/18/2023   Nonrheumatic mitral valve regurgitation 09/21/2022   Other fatigue 10/08/2021   Salt craving 06/25/2021   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/20/2020   Hearing loss 06/13/2020   S/P placement of cardiac pacemaker MDT 07/28/19 07/29/2019   History of CVA (cerebrovascular accident) 06/16/2019   Facial tingling 11/29/2018   Tremor of left hand 11/29/2018   Iron  deficiency anemia 10/21/2017   Constipation 08/02/2017   Numbness in feet 07/14/2017   Hip osteoarthritis 04/27/2017   Long term (current) use of anticoagulants - on coumadin  for afib 03/04/2017   Venous stasis dermatitis of both lower extremities 01/08/2017   Osteopenia 10/25/2016   Pedal edema 08/26/2016   Varicose veins of both lower extremities 08/26/2016   Estrogen deficiency 08/26/2016   Hemorrhoids 08/26/2016   History of nonmelanoma skin cancer 01/01/2016   Pruritus 10/04/2015   Hip pain 08/02/2014   Left knee pain 08/02/2014   Chronic cough 05/08/2014   Fatigue 08/16/2013   Left ovarian cyst 03/14/2013   (HFpEF) heart failure with preserved ejection fraction (HCC) 12/27/2012   PULMONARY NODULE 12/20/2008   Ganglion 10/04/2007   Mixed  incontinence 04/28/2007   Hyperlipidemia 04/27/2007   Depression with anxiety 04/27/2007   Asthma, mild intermittent 04/27/2007   Insomnia 04/27/2007   Acquired hypothyroidism 09/02/2006   Past Medical History:  Diagnosis Date   ADENOMATOUS COLONIC POLYP 11/04/2006   Qualifier: Diagnosis of   By: Lewellyn CMA (AAMA), Amanda         Allergic rhinitis    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 s/p PVI [name] 2010;  b. on tikosyn /coumadin ;  c. 05/2009 Echo: EF 60-65%, Gr 2 DD. (first dx 09/ 2007)   AV block 07/28/2019   Bilateral lower extremity edema    Bleeding hemorrhoid    Carotid stenosis    mild (hosp 3/11)- consult by vasc/ Dr Oris   Complication of anesthesia    hard to wake   Diverticulosis of colon    Dyspnea    on exertion-climbing stairs   Fatty liver    H/O cardiac radiofrequency ablation    01/ 2008 at [facility]  [place]  /  03/ 2010  at [facility] failure with preserved ejection fraction [facility])    History of adenomatous polyp of colon    tubular adenoma's   History of cardiomyopathy    secondary tachycardia-induced cardiomyopathy -- resolved 2014   History of colonic polyps 09/18/2009   Qualifier: Diagnosis of   By: Genie CMA LEODIS), Chick      IMO SNOMED Dx Update Oct 2024   Formatting of this note might be different from the original. Qualifier: Diagnosis of By: Kowalk CMA LEODIS), Chick     History of squamous cell carcinoma in situ (SCCIS) of skin    05/ 2017  nasal bridge and right medial knee   History of transient ischemic attack (TIA)    01-24-2005 and 06-12-2009   Hyperlipidemia    Hypothyroidism    Mild intermittent asthma    reacts to cats   Mixed stress and urge urinary incontinence    Presence of heart assist device (HCC) 03/25/2023   Presence of permanent cardiac pacemaker    was put in 07/2019   Pulmonary nodule    S/P AV  nodal ablation 07/28/19 07/29/2019   S/P mitral valve repair 10-23-1998  dr lansing at [facility]   for MVP and regurg. 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   Past Surgical History:  Procedure Laterality Date   APPENDECTOMY  1978   AV NODE ABLATION N/A 07/28/2019   Procedure: AV NODE ABLATION;  Surgeon: Fernande Elspeth BROCKS, MD;  Location: [facility] INVASIVE CV LAB;  Service: Cardiovascular;  Laterality: N/A;   BUBBLE STUDY  06/19/2019   Procedure: BUBBLE STUDY;  Surgeon: Mona Vinie BROCKS, MD;  Location: [facility] ENDOSCOPY;  Service: Cardiovascular;;   CARDIAC ELECTROPHYSIOLOGY MAPPING AND ABLATION  01/ 2008    at [facility] [place]    right-sided ablation atrial flutter   CARDIAC ELECTROPHYSIOLOGY STUDY AND ABLATION  03/ 2010   dr marland at [facility]   AV node ablation and pulmonary vein isolation for atrial fib   CARDIOVERSION  06-18-2006;  07-13-2006;  10-19-2010;  10-27-2010   CATARACT EXTRACTION W/PHACO Right 04/09/2021   Procedure: CATARACT EXTRACTION PHACO AND INTRAOCULAR LENS PLACEMENT (IOC) RIGHT 6.71 01:11.1;  Surgeon: Mittie Gaskin, MD;  Location: [facility] SURGERY CNTR;  Service: Ophthalmology;  Laterality: Right;   CATARACT EXTRACTION W/PHACO Left 04/23/2021   Procedure: CATARACT EXTRACTION PHACO AND INTRAOCULAR LENS PLACEMENT (IOC) LEFT;  Surgeon: Mittie Gaskin, MD;  Location: [facility] SURGERY CNTR;  Service: Ophthalmology;  Laterality: Left;  [place] 5.65 00:50.1   COLONOSCOPY     COLONOSCOPY WITH PROPOFOL  N/A 10/13/2017   Procedure: COLONOSCOPY WITH PROPOFOL ;  Surgeon: Therisa Bi, MD;  Location: [facility] ENDOSCOPY;  Service: Gastroenterology;  Laterality: N/A;   COLONOSCOPY WITH PROPOFOL  N/A 02/20/2020   Procedure: COLONOSCOPY WITH PROPOFOL ;  Surgeon: Maryruth Ole DASEN, MD;  Location: [facility] ENDOSCOPY;  Service: Endoscopy;  Laterality: N/A;   CYSTO/ TRANSURETHRAL COLLAGEN INJECTION  THERAPY  07-26-2007   dr gaston   DILATION AND CURETTAGE OF UTERUS     ESOPHAGOGASTRODUODENOSCOPY (EGD) WITH PROPOFOL  N/A 10/13/2017   Procedure: ESOPHAGOGASTRODUODENOSCOPY (EGD) WITH PROPOFOL ;  Surgeon: Therisa Bi, MD;  Location: [facility] ENDOSCOPY;  Service: Gastroenterology;  Laterality: N/A;   EVALUATION UNDER ANESTHESIA WITH HEMORRHOIDECTOMY N/A 04/09/2020   Procedure: EXAM UNDER ANESTHESIA WITH HEMORRHOIDECTOMY;  Surgeon: Jordis Laneta FALCON, MD;  Location: [facility] ORS;  Service: General;  Laterality: N/A;   EXCISIONAL  HEMORRHOIDECTOMY  1980s   GIVENS CAPSULE STUDY N/A 12/08/2017   Procedure: GIVENS CAPSULE STUDY;  Surgeon: Therisa Bi, MD;  Location: [facility] ENDOSCOPY;  Service: Gastroenterology;  Laterality: N/A;   HEMORRHOID SURGERY N/A 10/29/2016   Procedure: HEMORRHOIDECTOMY;  Surgeon: Debby Hila, MD;  Location: [facility];  Service: General;  Laterality: N/A;   MITRAL VALVE ANNULOPLASTY  10/23/1998   Model [id number]; [place] [id number]; size 32mm; [facility]; Dr. Lansing   PACEMAKER IMPLANT N/A 07/28/2019   Procedure: PACEMAKER IMPLANT;  Surgeon: Fernande Elspeth BROCKS, MD;  Location: [facility] INVASIVE CV LAB;  Service: Cardiovascular;  Laterality: N/A;   PILONIDAL CYST EXCISION  1954   RIGHT/LEFT HEART CATH AND CORONARY ANGIOGRAPHY Bilateral 09/21/2022   Procedure: RIGHT/LEFT HEART CATH AND CORONARY ANGIOGRAPHY;  Surgeon: Darron Deatrice LABOR, MD;  Location: [facility] INVASIVE CV LAB;  Service: Cardiovascular;  Laterality: Bilateral;   TEE WITH CARDIOVERSION  05-06-2006 at [facility];  01-02-2013 at [facility]   TEE WITHOUT CARDIOVERSION N/A 06/19/2019   Procedure: TRANSESOPHAGEAL ECHOCARDIOGRAM (TEE);  Surgeon: Mona Vinie BROCKS, MD;  Location: [facility] ENDOSCOPY;  Service: Cardiovascular;  Laterality: N/A;   TOTAL HIP ARTHROPLASTY Left 05/04/2017   Procedure: LEFT TOTAL HIP ARTHROPLASTY ANTERIOR APPROACH;  Surgeon: Vernetta Lonni GRADE, MD;  Location: [facility] OR;  Service: Orthopedics;  Laterality: Left;   TRANSTHORACIC  ECHOCARDIOGRAM  05-01-2015   dr fernande   ef 50-55%/  mild AV sclerosis without stenosis/  post MV repair with mild central MR (valve area by pressure half-time 2cm^2,  valve area by continutity equation 0.91cm^2, peak grandiant 15mmHg)/  severe LAE/ mild TR/ mild RAE    TUBAL LIGATION Bilateral 1978   Social History   Tobacco Use   Smoking status: Never   Smokeless tobacco: Never  Vaping Use   Vaping status: Never Used  Substance Use Topics   Alcohol  use: Not Currently    Comment: seldom   Drug use: No   Family History  Problem Relation Age of Onset   Lung cancer Father        smoker, died at 34   Alcohol  abuse Father    Cancer Father        bladder and lung CA smoker   Sudden death Other    Breast cancer Neg Hx    Stroke Neg Hx    Allergies  Allergen Reactions   Pacerone [Amiodarone] Swelling   Penicillins Hives and Rash    Tolerates ceftriaxone  during July 2025 admission   Zetia  [Ezetimibe ] Other (See Comments)    Unknown reaction   Lopressor  [Metoprolol ] Other (See Comments)    Hair loss   Statins Rash   Current Outpatient Medications on File Prior to Visit  Medication Sig Dispense Refill   acetaminophen  (TYLENOL ) 325 MG tablet Take 650 mg by mouth 2 (two) times daily as needed for moderate pain (pain score 4-6), fever or headache.     albuterol  (VENTOLIN  HFA) 108 (90 Base) MCG/ACT inhaler Inhale 1-2 puffs into the lungs every 6 (six) hours as needed for wheezing or shortness of breath. 18 g 2   apixaban  (ELIQUIS ) 5 MG TABS tablet Take 1 tablet (5 mg total) by mouth 2 (two) times daily. 60 tablet 5   augmented betamethasone dipropionate (DIPROLENE-AF) 0.05 % cream Apply 1 Application topically 2 (two) times daily.     bisoprolol  (ZEBETA ) 5 MG tablet Take 1 tablet (5 mg total) by mouth daily. 30 tablet 11   empagliflozin  (JARDIANCE ) 10 MG TABS tablet Take 1 tablet (10 mg  total) by mouth daily before breakfast. PLEASE SCHEDULE APPOINTMENT FOR MORE REFILLS 30 tablet 6    famotidine  (PEPCID ) 20 MG tablet TAKE 1 TABLET BY MOUTH TWICE A DAY 180 tablet 0   ferrous sulfate  325 (65 FE) MG EC tablet Take 1 tablet (325 mg total) by mouth daily with breakfast. 90 tablet 2   fluorouracil (EFUDEX) 5 % cream Apply 1 Application topically daily.     fluoruracil (CARAC) 0.5 % cream Apply topically daily.     fluticasone  (FLONASE ) 50 MCG/ACT nasal spray Place 1 spray into both nostrils 2 (two) times daily as needed for allergies or rhinitis. 16 g 11   furosemide  (LASIX ) 40 MG tablet Take 1 tablet (40 mg total) by mouth daily. 90 tablet 3   levothyroxine  (SYNTHROID ) 75 MCG tablet TAKE 1 TABLET EVERY DAY BEFORE BREAKFAST 90 tablet 1   losartan  (COZAAR ) 25 MG tablet TAKE 1 TABLET (25 MG TOTAL) BY MOUTH DAILY. 90 tablet 3   meclizine  (ANTIVERT ) 12.5 MG tablet Take 1 tablet (12.5 mg total) by mouth 3 (three) times daily as needed for dizziness. 30 tablet 0   mexiletine (MEXITIL ) 200 MG capsule TAKE 1 CAPSULE BY MOUTH TWICE A DAY 180 capsule 1   sertraline  (ZOLOFT ) 25 MG tablet TAKE 1 TABLET (25 MG TOTAL) BY MOUTH DAILY. IN EVENING 90 tablet 1   spironolactone  (ALDACTONE ) 25 MG tablet Take 0.5 tablets (12.5 mg total) by mouth daily. 45 tablet 3   UNABLE TO FIND Take 1 tablet by mouth daily. Unknown OTC allergy medication, red tablet (not pseudoephedrine).     No current facility-administered medications on file prior to visit.    Review of Systems  Constitutional:  Positive for fatigue. Negative for activity change, appetite change, fever and unexpected weight change.  HENT:  Negative for congestion, ear pain, rhinorrhea, sinus pressure and sore throat.   Eyes:  Negative for pain, redness and visual disturbance.  Respiratory:  Negative for cough, shortness of breath and wheezing.   Cardiovascular:  Negative for chest pain and palpitations.  Gastrointestinal:  Negative for abdominal pain, blood in stool, constipation and diarrhea.  Endocrine: Negative for polydipsia and polyuria.   Genitourinary:  Negative for dysuria, frequency and urgency.  Musculoskeletal:  Negative for arthralgias, back pain and myalgias.  Skin:  Negative for pallor and rash.  Allergic/Immunologic: Negative for environmental allergies.  Neurological:  Positive for dizziness. Negative for tremors, seizures, syncope, facial asymmetry, speech difficulty, weakness, light-headedness, numbness and headaches.  Hematological:  Negative for adenopathy. Does not bruise/bleed easily.  Psychiatric/Behavioral:  Negative for decreased concentration and dysphoric mood. The patient is not nervous/anxious.        Objective:   Physical Exam Constitutional:      General: She is not in acute distress.    Appearance: Normal appearance. She is well-developed. She is not ill-appearing or diaphoretic.     Comments: Overweight   HENT:     Head: Normocephalic and atraumatic.     Right Ear: Tympanic membrane and ear canal normal.     Left Ear: Tympanic membrane and ear canal normal.     Ears:     Comments: Hearing aides in place No cerumen     Nose: Nose normal.     Comments: Scant clear pnd     Mouth/Throat:     Mouth: Mucous membranes are moist.     Pharynx: Oropharynx is clear.  Eyes:     General: No scleral icterus.  Right eye: No discharge.        Left eye: No discharge.     Extraocular Movements: Extraocular movements intact.     Conjunctiva/sclera: Conjunctivae normal.     Pupils: Pupils are equal, round, and reactive to light.     Comments: No nystagmus today  Neck:     Thyroid : No thyromegaly.     Vascular: No carotid bruit or JVD.  Cardiovascular:     Rate and Rhythm: Normal rate and regular rhythm.     Heart sounds: Normal heart sounds.     No gallop.  Pulmonary:     Effort: Pulmonary effort is normal. No respiratory distress.     Breath sounds: Normal breath sounds. No wheezing or rales.  Abdominal:     General: There is no distension or abdominal bruit.     Palpations: Abdomen is  soft.  Musculoskeletal:     Cervical back: Normal range of motion and neck supple.     Right lower leg: No edema.     Left lower leg: No edema.  Lymphadenopathy:     Cervical: No cervical adenopathy.  Skin:    General: Skin is warm and dry.     Coloration: Skin is not pale.     Findings: No rash.  Neurological:     Mental Status: She is alert.     Cranial Nerves: No cranial nerve deficit, dysarthria or facial asymmetry.     Sensory: No sensory deficit.     Motor: No weakness, tremor, atrophy, abnormal muscle tone, seizure activity or pronator drift.     Coordination: Coordination normal. Finger-Nose-Finger Test normal.     Gait: Gait normal.     Deep Tendon Reflexes: Reflexes are normal and symmetric. Reflexes normal.  Psychiatric:        Mood and Affect: Mood normal.           Assessment & Plan:   Problem List Items Addressed This Visit       Other   Vertigo - Primary   Had a mod to severe episode and this is improved  Still some dizziness with position change/in am  Seen in ER Reviewed hospital records, lab results and studies in detail  Overall reassuring  Old infarcts right frontal and left occip lobe noted  Pt has neurology follow up tomorrow   Discussed peripheral vs central vertigo  Some pnd /otherwise no sinus symptoms   Referral to ENT for further eval Handout given  Encouraged to change position slowly Meclizine  is on hand/has not needed   Call back and Er precautions noted in detail today    I personally spent a total of 30 minutes in the care of the patient today including preparing to see the patient, getting/reviewing separately obtained history, performing a medically appropriate exam/evaluation, counseling and educating, placing orders, referring and communicating with other health care professionals, and documenting clinical information in the EHR.       Relevant Orders   Ambulatory referral to ENT   History of CVA (cerebrovascular accident)    This was noted on CT in ER

## 2024-01-24 NOTE — Assessment & Plan Note (Signed)
 This was noted on CT in ER

## 2024-01-24 NOTE — Assessment & Plan Note (Addendum)
 Had a mod to severe episode and this is improved  Still some dizziness with position change/in am  Seen in ER Reviewed hospital records, lab results and studies in detail  Overall reassuring  Old infarcts right frontal and left occip lobe noted  Pt has neurology follow up tomorrow   Discussed peripheral vs central vertigo  Some pnd /otherwise no sinus symptoms   Referral to ENT for further eval Handout given  Encouraged to change position slowly Meclizine  is on hand/has not needed   Call back and Er precautions noted in detail today    I personally spent a total of 30 minutes in the care of the patient today including preparing to see the patient, getting/reviewing separately obtained history, performing a medically appropriate exam/evaluation, counseling and educating, placing orders, referring and communicating with other health care professionals, and documenting clinical information in the EHR.

## 2024-01-24 NOTE — Patient Instructions (Addendum)
 See neurology as planned tomorrow  Make sure you talk about vertigo and review your ER records and imaging    I put the referral in for ENT (ear/nose/throat) specialty for further evaluation of the vertigo  Please let us  know if you don't hear in 1-2 weeks to set that up (mychart message or call or letter)   Take the meclizine  if dizziness gets much worse and let us  know   Continue to change position slowly/especially when you get up first thing in the am (take your time getting out of bed)

## 2024-01-25 ENCOUNTER — Ambulatory Visit: Payer: Self-pay | Admitting: Cardiology

## 2024-01-25 DIAGNOSIS — R6889 Other general symptoms and signs: Secondary | ICD-10-CM | POA: Diagnosis not present

## 2024-01-25 DIAGNOSIS — Z45018 Encounter for adjustment and management of other part of cardiac pacemaker: Secondary | ICD-10-CM | POA: Diagnosis not present

## 2024-01-25 DIAGNOSIS — Z8673 Personal history of transient ischemic attack (TIA), and cerebral infarction without residual deficits: Secondary | ICD-10-CM | POA: Diagnosis not present

## 2024-01-25 DIAGNOSIS — I639 Cerebral infarction, unspecified: Secondary | ICD-10-CM | POA: Diagnosis not present

## 2024-01-25 DIAGNOSIS — Z7901 Long term (current) use of anticoagulants: Secondary | ICD-10-CM | POA: Diagnosis not present

## 2024-01-26 ENCOUNTER — Ambulatory Visit: Admitting: Physical Therapy

## 2024-01-26 NOTE — Progress Notes (Signed)
 Remote PPM Transmission

## 2024-01-28 ENCOUNTER — Other Ambulatory Visit: Payer: Self-pay | Admitting: *Deleted

## 2024-01-28 NOTE — Patient Outreach (Signed)
 EMMI referral received for depression. Patient verbalized a long history of depression, stating that she often has challenges with depression. Patient denied thoughts of self harm. Declines need for medication management. Reports that she manages her depression by staying focused and having a routine  She is also engaged in church and has a strong spiritual life. Patient's son will also be moving in with her.  Patient declined need for additional follow up at this time. Patient encouraged to contact this social worker with any additional community resource needs.   Reinhold Rickey, LCSW [place]  [facility], [facility] Licensed Clinical Social Worker  Direct Dial: [phone]

## 2024-02-02 ENCOUNTER — Other Ambulatory Visit (INDEPENDENT_AMBULATORY_CARE_PROVIDER_SITE_OTHER): Payer: Self-pay

## 2024-02-02 ENCOUNTER — Ambulatory Visit (INDEPENDENT_AMBULATORY_CARE_PROVIDER_SITE_OTHER): Admitting: Orthopaedic Surgery

## 2024-02-02 DIAGNOSIS — M25551 Pain in right hip: Secondary | ICD-10-CM

## 2024-02-02 MED ORDER — METHYLPREDNISOLONE ACETATE 40 MG/ML IJ SUSP
40.0000 mg | INTRAMUSCULAR | Status: AC | PRN
  Administered 2024-02-02: 40 mg via INTRA_ARTICULAR

## 2024-02-02 MED ORDER — LIDOCAINE HCL 1 % IJ SOLN
3.0000 mL | INTRAMUSCULAR | Status: AC | PRN
  Administered 2024-02-02: 3 mL

## 2024-02-02 NOTE — Progress Notes (Signed)
 The patient is an 89 year old who we replaced her left hip remotely in 2019.  She does ambulate slowly with a cane.  She reports right hip pain and wants to discuss hip replacement surgery.  However she denies any groin pain her pain is on the lateral aspect of her right hip and her IT band toward her knee.  On exam her right hip moves smoothly and fully with no blocks or rotation and no pain in the groin at all.  She does have pain to palpation over the trochanteric area and the IT band.  An AP pelvis and of the right hip still shows a well-maintained joint space on the right side.  The left hip replacement is well-seated.  I did recommend a steroid injection over the point of maximal tenderness over her trochanteric area of the left hip and she agreed to this and tolerated it well.  Would be prudent to send her to outpatient physical therapy with any modalities that could help her right hip trochanteric pain and IT band pain down past the knee.  Any modalities per therapist discretion.  We can then see her back in about 3 months to see how she is doing overall.    Procedure Note  Patient: [name]             Date of Birth: 30-Jun-1932           MRN: [id number]             Visit Date: 02/02/2024  Procedures: Visit Diagnoses:  1. Pain in right hip     Large Joint Inj: R greater trochanter on 02/02/2024 11:05 AM Indications: pain and diagnostic evaluation Details: 22 G 1.5 in needle, lateral approach  Arthrogram: No  Medications: 3 mL lidocaine  1 %; 40 mg methylPREDNISolone acetate 40 MG/ML Outcome: tolerated well, no immediate complications Procedure, treatment alternatives, risks and benefits explained, specific risks discussed. Consent was given by the patient. Immediately prior to procedure a time out was called to verify the correct patient, procedure, equipment, support staff and site/side marked as required. Patient was prepped and draped in the usual sterile fashion.

## 2024-02-03 ENCOUNTER — Other Ambulatory Visit: Payer: Self-pay

## 2024-02-03 DIAGNOSIS — M25551 Pain in right hip: Secondary | ICD-10-CM

## 2024-02-03 MED ORDER — FUROSEMIDE 40 MG PO TABS: 40.0000 mg | ORAL_TABLET | Freq: Every day | ORAL | 3 refills | Status: AC

## 2024-02-04 DIAGNOSIS — H43391 Other vitreous opacities, right eye: Secondary | ICD-10-CM | POA: Diagnosis not present

## 2024-02-04 DIAGNOSIS — H26492 Other secondary cataract, left eye: Secondary | ICD-10-CM | POA: Diagnosis not present

## 2024-02-04 DIAGNOSIS — H43392 Other vitreous opacities, left eye: Secondary | ICD-10-CM | POA: Diagnosis not present

## 2024-02-04 DIAGNOSIS — H26491 Other secondary cataract, right eye: Secondary | ICD-10-CM | POA: Diagnosis not present

## 2024-02-14 ENCOUNTER — Telehealth: Payer: Self-pay | Admitting: Family

## 2024-02-14 NOTE — Telephone Encounter (Signed)
 Called to confirm/remind patient of their appointment at the [facility] on 02/15/24.   Appointment:   [] Confirmed  [] Left mess   [] No answer/No voice mail  [x] VM Full/unable to leave message  [] Phone not in service  Patient reminded to bring all medications and/or complete list.  Confirmed patient has transportation. Gave directions, instructed to utilize valet parking.

## 2024-02-14 NOTE — Progress Notes (Unsigned)
 [facility] NOTE  Referring Physician: Tower, Laine LABOR, MD  Primary Care: Tower, Laine LABOR, MD Primary Cardiologist: Dr. Fernande  Chief Complaint:   HPI:  [name] is a 89 y.o. female with permanent atrial fibrillation,AFL s/p ablation ([name]) heart failure with recovered ejection fraction, mitral regurgitation status post MVR, history of AV node ablation with permanent pacemaker implant, history of stroke presenting today to establish care. Her cardiac history dates back to at least 2001 when she underwent mitral valve repair with annuloplasty ring at [facility] She did well for several years until she was diagnosed with atrial fibrillation resistent to anti-arrythmics.  Her atrial fibrillation was believed to be secondary to atriotomy from AFL ablation.  She eventually underwent AV nodal ablation with placement of single-chamber permanent pacemaker.  Once more she did fairly well with improvement in functional status until the past several weeks to months when she has had a steady decline.  She had an echocardiogram in May 2024 due to chest pain and shortness of breath.  Echo was significant for drop in EF to 25 to 30%.  Follow-up heart catheterization with CTO of the small PLV branch vessel but otherwise no significant CAD.  Since addition of GDMT, she has had improvement in LV function from 25% to 45%.   Was in the ED 01/10/24 with dizziness. Dix-Hallpike maneuver was positive on the right with observed rotary nystagmus. She had a Epley maneuver performed afterwards which improved her symptoms but was still having some mild dizziness with movement. Unable to do MRI due to pacemaker.  She had a CTA which showed patent vertebral arteries with old infarcts in the posterior frontal lobe and left occipital lobe but no cerebellar infarcts. Symptoms improved after meclizine .   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 s/p PVI [name] 2010;  b. on tikosyn /coumadin ;  c. 05/2009 Echo: EF 60-65%, Gr 2 DD. (first dx 09/ 2007)   AV block 07/28/2019   Bilateral lower extremity edema    Bleeding hemorrhoid    Carotid stenosis    mild (hosp 3/11)- consult by vasc/ Dr Oris   Complication of anesthesia    hard to wake   Diverticulosis of colon    Dyspnea    on exertion-climbing stairs   Fatty liver    H/O cardiac radiofrequency ablation    01/ 2008 at [facility]  /  03/ 2010  at [facility]   Heart failure with preserved ejection fraction [facility])    History of adenomatous polyp of colon    tubular adenoma's   History of cardiomyopathy    secondary tachycardia-induced cardiomyopathy -- resolved 2014   History of colonic polyps 09/18/2009   Qualifier: Diagnosis of   By: Kowalk CMA (AAMA), Chick      IMO SNOMED Dx Update Oct 2024   Formatting of this note might be different from the original. Qualifier: Diagnosis of By: Kowalk CMA LEODIS), Chick     History of squamous  cell carcinoma in situ (SCCIS) of skin    05/ 2017  nasal bridge and right medial knee   History of transient ischemic attack (TIA)    01-24-2005 and 06-12-2009   Hyperlipidemia    Hypothyroidism    Mild intermittent asthma    reacts to cats   Mixed stress and urge urinary incontinence    Presence of heart assist device (HCC) 03/25/2023   Presence of permanent cardiac pacemaker    was put in 07/2019   Pulmonary nodule    S/P AV nodal ablation 07/28/19 07/29/2019   S/P mitral valve repair 10-23-1998  dr lansing at [facility]   for MVP and regurg. 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     albuterol  (VENTOLIN  HFA) 108 (90 Base) MCG/ACT inhaler Inhale 1-2 puffs into the lungs every 6 (six) hours as needed for wheezing or shortness of breath. 18 g 2   apixaban  (ELIQUIS ) 5 MG TABS tablet Take 1 tablet (5 mg total) by mouth 2 (two) times daily. 60 tablet 5   augmented betamethasone dipropionate (DIPROLENE-AF) 0.05 % cream Apply 1 Application topically 2 (two) times daily.     bisoprolol  (ZEBETA ) 5 MG tablet Take 1 tablet (5 mg total) by mouth daily. 30 tablet 11   empagliflozin  (JARDIANCE ) 10 MG TABS tablet Take 1 tablet (10 mg total) by mouth daily before breakfast. PLEASE SCHEDULE APPOINTMENT FOR MORE REFILLS 30 tablet 6   famotidine  (PEPCID ) 20 MG tablet TAKE 1 TABLET BY MOUTH TWICE A DAY 180 tablet 0   ferrous sulfate  325 (65 FE) MG EC tablet Take 1 tablet (325 mg total) by mouth daily with breakfast. 90 tablet 2   fluorouracil (EFUDEX) 5 % cream Apply 1 Application topically daily.     fluoruracil (CARAC) 0.5 % cream Apply topically daily.     fluticasone  (FLONASE ) 50 MCG/ACT nasal spray Place 1 spray into both nostrils 2 (two) times daily as needed for allergies or rhinitis. 16 g 11   furosemide  (LASIX ) 40 MG tablet Take 1 tablet (40 mg total) by mouth daily. 90 tablet 3   levothyroxine  (SYNTHROID ) 75 MCG tablet TAKE 1 TABLET EVERY DAY BEFORE BREAKFAST 90 tablet 1   losartan  (COZAAR ) 25 MG tablet TAKE 1 TABLET (25 MG TOTAL) BY MOUTH DAILY. 90 tablet 3   meclizine  (ANTIVERT ) 12.5 MG tablet Take 1 tablet (12.5 mg total) by mouth 3 (three) times daily as needed for dizziness. 30 tablet 0   mexiletine (MEXITIL ) 200 MG capsule TAKE 1 CAPSULE BY MOUTH TWICE A DAY 180  capsule 1   sertraline  (ZOLOFT ) 25 MG tablet TAKE 1 TABLET (25 MG TOTAL) BY MOUTH DAILY. IN EVENING 90 tablet 1   spironolactone  (ALDACTONE ) 25 MG tablet Take 0.5 tablets (12.5 mg total) by mouth daily. 45 tablet 3   UNABLE TO FIND Take 1 tablet by mouth daily. Unknown OTC allergy medication, red tablet (not pseudoephedrine).     No current facility-administered medications for this visit.    Allergies  Allergen Reactions   Pacerone [Amiodarone] Swelling   Penicillins Hives and Rash    Tolerates ceftriaxone  during July 2025 admission   Zetia  [Ezetimibe ] Other (See Comments)  Unknown reaction   Lopressor  [Metoprolol ] Other (See Comments)    Hair loss   Statins Rash      Social History   Socioeconomic History   Marital status: Widowed    Spouse name: Not on file   Number of children: 6   Years of education: Not on file   Highest education level: Not on file  Occupational History   Occupation: realtor    Employer: RETIRED  Tobacco Use   Smoking status: Never   Smokeless tobacco: Never  Vaping Use   Vaping status: Never Used  Substance and Sexual Activity   Alcohol  use: Not Currently    Comment: seldom   Drug use: No   Sexual activity: Not Currently  Other Topics Concern   Not on file  Social History Narrative   Retired. Daily Caffeine use: 2 daily    Social Drivers of Corporate Investment Banker Strain: Low Risk  (03/22/2023)   Overall Financial Resource Strain (CARDIA)    Difficulty of Paying Living Expenses: Not hard at all  Food Insecurity: No Food Insecurity (10/11/2023)   Hunger Vital Sign    Worried About Running Out of Food in the Last Year: Never true    Ran Out of Food in the Last Year: Never true  Transportation Needs: No Transportation Needs (10/11/2023)   PRAPARE - Administrator, Civil Service (Medical): No    Lack of Transportation (Non-Medical): No  Physical Activity: Inactive (03/22/2023)   Exercise Vital Sign    Days of Exercise  per Week: 0 days    Minutes of Exercise per Session: 0 min  Stress: No Stress Concern Present (03/22/2023)   [employer] of Occupational Health - Occupational Stress Questionnaire    Feeling of Stress : Only a little  Social Connections: Moderately Integrated (10/06/2023)   Social Connection and Isolation Panel    Frequency of Communication with Friends and Family: Three times a week    Frequency of Social Gatherings with Friends and Family: Once a week    Attends Religious Services: More than 4 times per year    Active Member of [employer] or Organizations: Yes    Attends Banker Meetings: 1 to 4 times per year    Marital Status: Widowed  Intimate Partner Violence: Not At Risk (10/11/2023)   Humiliation, Afraid, Rape, and Kick questionnaire    Fear of Current or Ex-Partner: No    Emotionally Abused: No    Physically Abused: No    Sexually Abused: No      Family History  Problem Relation Age of Onset   Lung cancer Father        smoker, died at 11   Alcohol  abuse Father    Cancer Father        bladder and lung CA smoker   Sudden death Other    Breast cancer Neg Hx    Stroke Neg Hx     PHYSICAL EXAM:  GENERAL: Well nourished, well developed, and in no apparent distress at rest.  HEENT: Negative for arcus senilis or xanthelasma. There is no scleral icterus.  The mucous membranes are pink and moist.   NECK: Supple, No masses. Normal carotid upstrokes without bruits. No masses or thyromegaly.    CHEST: There are no chest wall deformities. There is no chest wall tenderness. Respirations are unlabored.  Lungs- CTA B/L CARDIAC:  JVP: 7 cm          Normal rate with regular rhythm.  No murmurs, rubs or gallops.  Pulses are 2+ and symmetrical in upper and lower extremities. No edema.  ABDOMEN: Soft, non-tender, non-distended. There are no masses or hepatomegaly. There are normal bowel sounds.  EXTREMITIES: Warm and well perfused with no cyanosis, clubbing.  LYMPHATIC: No  axillary or supraclavicular lymphadenopathy.  NEUROLOGIC: Patient is oriented x3 with no focal or lateralizing neurologic deficits.  PSYCH: Patients affect is appropriate, there is no evidence of anxiety or depression.  SKIN: Warm and dry; no lesions or wounds.      DATA REVIEW  ECG: 10/08/22: atrial fibrillation with RV pacing  As per my personal interpretation 01/10/24: Vpaced rhythm, HR 71  ECHO: 08/18/22: LVEF 35% with significant dyssynchrony due to RV pacing As per my personal interpretation 02/23/23: LVEF 45-50%, normal RV, severe LAE, mildly elevated PA pressure, mild MR, moderate TR 10/06/23: LVEF 55-60%, moderate LVH, normal RV, severe BAE, mild/ moderate MR, modertate TR  CATH: 09/21/22:  3rd RPL lesion is 100% stenosed.   There is moderate to severe left ventricular systolic dysfunction.   LV end diastolic pressure is normal.   The left ventricular ejection fraction is 25-35% by visual estimate.   1.  No significant coronary artery disease with the exception of an occluded small RPL branch with collaterals from the left circumflex. 2.  Moderately to severely reduced LV systolic function. 3.  Right heart catheterization showed high normal filling pressures, normal pulmonary pressure and normal cardiac output.   RA: 4 mmHg PW: 11/22 with a mean of 13 mmHg.  Prominent V wave suggestive of at least moderate mitral regurgitation. PA: 31/13 with a mean of 20 mmHg Cardiac output: 4.72 with an index of 2.55.   ASSESSMENT & PLAN:  Heart failure with reduced ejection fraction Etiology of HF:HF likely secondary to chronic RV pacing; most recent device interrogation from 09/16/22 with >90% RV pacing consistent with history of AVN ablation.  NYHA class / AHA Stage:IIB. Becomes fatigued due to deconditioning.  Volume status & Diuretics: Euvolemic Vasodilators: continue losartan  25mg  daily Beta-Blocker: increase bisoprolol  5mg   FMJ:dujmu spironolactone  at follow up.   Cardiometabolic:start farxiga 10mg  Devices therapies & Valvulopathies:Permanent atrial fibrillation s/p AVN with single chamber PPM. She is 90% RV paced, however, has had improvement in her LVEF to 40-45% with low dose GDMT and control of ectopy. Will defer upgrade to CRT for the time being.  Advanced therapies:Not indicated.   2.  Permanent atrial fibrillation -Previously on rhythm control with Tikosyn  however discontinued due to progression to permanent AF. -AV node ablation in May 2021 with permanent pacemaker placement -History of stroke due to subtherapeutic INR. - appears to be in afib today.   3. MR s/p mitral annuloplasty - Details not available; TEE from 2021 with prosthetic annuloplasty ring from 10/23/1998.   I spent 38 minutes caring for this patient today including face to face time, ordering and reviewing labs, reviewing records from Dr. Fernande (01/26/23), seeing the patient, discussing risk and benefits of CRT upgrade while reviewing her echocardiogram  documenting in the record, and arranging follow ups.   Aditya Sabharwal Advanced Heart Failure Mechanical Circulatory Support

## 2024-02-15 ENCOUNTER — Encounter: Payer: Self-pay | Admitting: Family

## 2024-02-15 ENCOUNTER — Ambulatory Visit: Attending: Family | Admitting: Family

## 2024-02-15 VITALS — BP 127/60 | HR 86 | Wt 178.5 lb

## 2024-02-15 DIAGNOSIS — R002 Palpitations: Secondary | ICD-10-CM | POA: Insufficient documentation

## 2024-02-15 DIAGNOSIS — R0982 Postnasal drip: Secondary | ICD-10-CM | POA: Insufficient documentation

## 2024-02-15 DIAGNOSIS — I5020 Unspecified systolic (congestive) heart failure: Secondary | ICD-10-CM | POA: Diagnosis present

## 2024-02-15 DIAGNOSIS — I4821 Permanent atrial fibrillation: Secondary | ICD-10-CM | POA: Insufficient documentation

## 2024-02-15 DIAGNOSIS — I428 Other cardiomyopathies: Secondary | ICD-10-CM | POA: Diagnosis not present

## 2024-02-15 DIAGNOSIS — R058 Other specified cough: Secondary | ICD-10-CM | POA: Insufficient documentation

## 2024-02-15 DIAGNOSIS — K760 Fatty (change of) liver, not elsewhere classified: Secondary | ICD-10-CM | POA: Diagnosis not present

## 2024-02-15 DIAGNOSIS — I482 Chronic atrial fibrillation, unspecified: Secondary | ICD-10-CM | POA: Diagnosis not present

## 2024-02-15 DIAGNOSIS — I340 Nonrheumatic mitral (valve) insufficiency: Secondary | ICD-10-CM | POA: Diagnosis not present

## 2024-02-15 DIAGNOSIS — Z79899 Other long term (current) drug therapy: Secondary | ICD-10-CM | POA: Diagnosis not present

## 2024-02-15 DIAGNOSIS — E039 Hypothyroidism, unspecified: Secondary | ICD-10-CM | POA: Insufficient documentation

## 2024-02-15 DIAGNOSIS — Z8673 Personal history of transient ischemic attack (TIA), and cerebral infarction without residual deficits: Secondary | ICD-10-CM | POA: Diagnosis not present

## 2024-02-15 DIAGNOSIS — Z7984 Long term (current) use of oral hypoglycemic drugs: Secondary | ICD-10-CM | POA: Insufficient documentation

## 2024-02-15 DIAGNOSIS — Z950 Presence of cardiac pacemaker: Secondary | ICD-10-CM | POA: Diagnosis not present

## 2024-02-15 DIAGNOSIS — Z952 Presence of prosthetic heart valve: Secondary | ICD-10-CM | POA: Diagnosis not present

## 2024-02-15 DIAGNOSIS — Z79890 Hormone replacement therapy (postmenopausal): Secondary | ICD-10-CM | POA: Diagnosis not present

## 2024-02-15 DIAGNOSIS — I5032 Chronic diastolic (congestive) heart failure: Secondary | ICD-10-CM | POA: Diagnosis not present

## 2024-02-15 DIAGNOSIS — R400 Somnolence: Secondary | ICD-10-CM | POA: Insufficient documentation

## 2024-02-15 DIAGNOSIS — Z95811 Presence of heart assist device: Secondary | ICD-10-CM | POA: Insufficient documentation

## 2024-02-15 DIAGNOSIS — R5383 Other fatigue: Secondary | ICD-10-CM | POA: Insufficient documentation

## 2024-02-15 DIAGNOSIS — Z7901 Long term (current) use of anticoagulants: Secondary | ICD-10-CM | POA: Diagnosis not present

## 2024-02-15 DIAGNOSIS — R9431 Abnormal electrocardiogram [ECG] [EKG]: Secondary | ICD-10-CM | POA: Insufficient documentation

## 2024-02-15 DIAGNOSIS — K5730 Diverticulosis of large intestine without perforation or abscess without bleeding: Secondary | ICD-10-CM | POA: Diagnosis not present

## 2024-02-15 DIAGNOSIS — E785 Hyperlipidemia, unspecified: Secondary | ICD-10-CM | POA: Insufficient documentation

## 2024-02-15 DIAGNOSIS — H5500 Unspecified nystagmus: Secondary | ICD-10-CM | POA: Diagnosis not present

## 2024-02-15 DIAGNOSIS — E782 Mixed hyperlipidemia: Secondary | ICD-10-CM | POA: Diagnosis not present

## 2024-02-15 LAB — BASIC METABOLIC PANEL WITH GFR
BUN/Creatinine Ratio: 29 — ABNORMAL HIGH (ref 12–28)
BUN: 26 mg/dL (ref 8–27)
CO2: 23 mmol/L (ref 20–29)
Calcium: 9.4 mg/dL (ref 8.7–10.3)
Chloride: 103 mmol/L (ref 96–106)
Creatinine, Ser: 0.91 mg/dL (ref 0.57–1.00)
Glucose: 80 mg/dL (ref 70–99)
Potassium: 4.5 mmol/L (ref 3.5–5.2)
Sodium: 142 mmol/L (ref 134–144)
eGFR: 60 mL/min/1.73 (ref >59)

## 2024-02-15 NOTE — Patient Instructions (Signed)
 Medication Changes:  No medication changes today!  Lab Work:   Go downstairs to [place] [facility] on LOWER [address]EV[address] to have your blood work completed.  We will only call you if the results are abnormal or if the provider would like to make medication changes.  No news is good news.    Follow-Up in: Please follow up with the [facility] in 6 months with Ellouise Class, FNP.   Thank you for choosing [facility].    At the [facility], you and your health needs are our priority. We have a designated team specialized in the treatment of Heart Failure. This Care Team includes your primary Heart Failure Specialized Cardiologist (physician), Advanced Practice Providers (APPs- Physician Assistants and Nurse Practitioners), and Pharmacist who all work together to provide you with the care you need, when you need it.   You may see any of the following providers on your designated Care Team at your next follow up:  Dr. Toribio Fuel Dr. Ezra Shuck Dr. Ria Commander Dr. Morene Brownie Ellouise Class, FNP Jaun Bash, RPH-CPP  Please be sure to bring in all your medications bottles to every appointment.   Need to Contact Us :  If you have any questions or concerns before your next appointment please send us  a message through [place] or call our office at [phone].    TO LEAVE A MESSAGE FOR THE NURSE SELECT OPTION 2, PLEASE LEAVE A MESSAGE INCLUDING: YOUR NAME DATE OF BIRTH CALL BACK NUMBER REASON FOR CALL**this is important as we prioritize the call backs  YOU WILL RECEIVE A CALL BACK THE SAME DAY AS LONG AS YOU CALL BEFORE 4:00 PM

## 2024-02-16 ENCOUNTER — Ambulatory Visit: Payer: Self-pay | Admitting: Family

## 2024-02-24 ENCOUNTER — Other Ambulatory Visit: Payer: Self-pay

## 2024-02-24 ENCOUNTER — Ambulatory Visit: Payer: Self-pay

## 2024-02-24 ENCOUNTER — Emergency Department
Admission: EM | Admit: 2024-02-24 | Discharge: 2024-02-24 | Disposition: A | Discharge status: Home / Self Care | Attending: Emergency Medicine | Admitting: Emergency Medicine

## 2024-02-24 DIAGNOSIS — R519 Headache, unspecified: Secondary | ICD-10-CM | POA: Diagnosis present

## 2024-02-24 DIAGNOSIS — R9431 Abnormal electrocardiogram [ECG] [EKG]: Secondary | ICD-10-CM | POA: Diagnosis not present

## 2024-02-24 DIAGNOSIS — I509 Heart failure, unspecified: Secondary | ICD-10-CM | POA: Diagnosis not present

## 2024-02-24 HISTORY — DX: Trigeminal neuralgia: G50.0

## 2024-02-24 MED ORDER — GABAPENTIN 100 MG PO CAPS: 100.0000 mg | ORAL_CAPSULE | Freq: Three times a day (TID) | ORAL | 0 refills | Status: AC

## 2024-02-24 NOTE — ED Provider Notes (Signed)
 [facility] Provider Note    Event Date/Time   First MD Initiated Contact with Patient 02/24/24 1258     (approximate)   History   Facial Pain   HPI  [name] is a 89 y.o. female with PMH of heart failure, A-fib, hyperlipidemia and trigeminal neuralgia presents for evaluation of facial pain.  Patient states that she had sudden sharp pain that shot from her lip up to her eye once this morning.  She put lidocaine  on her face to try to treat the pain.  She reached out to her primary care doctor who was unable to get her in today and recommended she come to the ER.  Patient denies numbness of the face, weakness in extremities.  No fevers.      Physical Exam   Triage Vital Signs: ED Triage Vitals  Encounter Vitals Group     BP 02/24/24 1218 132/71     Girls Systolic BP Percentile --      Girls Diastolic BP Percentile --      Boys Systolic BP Percentile --      Boys Diastolic BP Percentile --      Pulse Rate 02/24/24 1218 80     Resp 02/24/24 1218 18     Temp 02/24/24 1218 97.8 F (36.6 C)     Temp Source 02/24/24 1218 Oral     SpO2 02/24/24 1218 98 %     Weight 02/24/24 1218 150 lb (68 kg)     Height 02/24/24 1218 5' 5 (1.651 m)     Head Circumference --      Peak Flow --      Pain Score 02/24/24 1217 10     Pain Loc --      Pain Education --      Exclude from Growth Chart --     Most recent vital signs: Vitals:   02/24/24 1218  BP: 132/71  Pulse: 80  Resp: 18  Temp: 97.8 F (36.6 C)  SpO2: 98%   General: Awake, no distress.  CV:  Good peripheral perfusion.  RRR. Resp:  Normal effort.  CTAB. Abd:  No distention.  Other:  Dried lidocaine  on the face, no erythema or rash, PERRL, EOM intact, symmetric facial movements, no facial droop, facial sensation intact in all dermatomes, patient able to differentiate between sharp and dull sensation in all dermatomes.  No pronator drift, no ataxia   ED Results / Procedures / Treatments    Labs (all labs ordered are listed, but only abnormal results are displayed) Labs Reviewed - No data to display   PROCEDURES:  Critical Care performed: No  Procedures   MEDICATIONS ORDERED IN ED: Medications - No data to display   IMPRESSION / MDM / ASSESSMENT AND PLAN / ED COURSE  I reviewed the triage vital signs and the nursing notes.                             89 year old female presents for evaluation of facial pain.  Vital signs are stable patient NAD on exam.  Differential diagnosis includes, but is not limited to, trigeminal neuralgia, other neuralgia, Bell's palsy, stroke, functional neurological syndrome.  Patient's presentation is most consistent with acute complicated illness / injury requiring diagnostic workup.  Nursing staff reported that patient was having some left shoulder pain and due to her history of A-fib and heart failure an EKG was obtained which was  normal.  Physical exam is reassuring, do not note any focal neurodeficits.  Do not believe patient is having a stroke.  She does not have any facial droop consistent with Bell's palsy.  Patient describes the pain as starting from the left side of her lip and going directly up to her eye.  This is not consistent with the trigeminal nerve but she does describe it as being a very sharp pain which seems consistent with nerve pain.  I reviewed patient's chart and do not see that she was definitively diagnosed with trigeminal neuralgia by neurology but there were multiple providers who thought her pain may have been related to this.  She never received any specific treatment for this as her symptoms were not deemed severe enough to warrant treatment.  I spoke with the patient regarding possible treatment options.  She feels like she does not get significant enough relief from the lidocaine  and would like to try some medication for nerve pain.  Since I am not sure that her pain is coming from trigeminal neuralgia I will  hold off on prescribing carbamazepine at this time due to the significant number of side effects and her extensive medical history.  Will prescribe a low-dose gabapentin  as I am concerned about sedative effects and advised her to follow-up with neurology.  Patient voiced understanding, all questions were answered and she is stable at discharge.      FINAL CLINICAL IMPRESSION(S) / ED DIAGNOSES   Final diagnoses:  Facial pain     Rx / DC Orders   ED Discharge Orders          Ordered    gabapentin  (NEURONTIN ) 100 MG capsule  3 times daily        02/24/24 1507             Note:  This document was prepared using Dragon voice recognition software and may include unintentional dictation errors.   Cleaster Tinnie LABOR, PA-C 02/24/24 1508    Jossie Artist POUR, MD 02/25/24 1539

## 2024-02-24 NOTE — Telephone Encounter (Signed)
 In ED now  Will watch for correspondence

## 2024-02-24 NOTE — ED Triage Notes (Signed)
 Patient states I'm having a trigeminal attack; complaining of pain to right side of face since this AM.

## 2024-02-24 NOTE — Telephone Encounter (Signed)
 FYI Only or Action Required?: FYI only for provider: ED advised.  Patient was last seen in primary care on 01/24/2024 by Randeen Laine LABOR, MD.  Called Nurse Triage reporting Facial Pain.  Symptoms began today.  Interventions attempted: Nothing.  Symptoms are: rapidly worsening.  Triage Disposition: Go to ED Now (or PCP Triage)  Patient/caregiver understands and will follow disposition?: Yes     Reason for Disposition  Patient sounds very sick or weak to the triagerBoth  Answer Assessment - Initial Assessment Questions Additional info: Patient calling in for advise to control trigeminal nerve pain flair, I'm not under any stress I don't know why this came on When asking patient location of pain she stated don't you know what a trigeminal nerve is and then went on to explain, stated it hits all those nerve in the pathway it hurts intense to talk, stop asking me question and tell me how to control this.  ER advised.     1. ONSET: When did the pain start? (e.g., minutes, hours, days)     Entire face mouth and eye 2. ONSET: Does the pain come and go, or has it been constant since it started? (e.g., constant, intermittent, fleeting)     Constant  3. SEVERITY: How bad is the pain? (Scale 1-10; mild, moderate or severe)     severe 4. LOCATION: Where does it hurt?      Trigeminal nerve Follows nerve pathway due you know what a trigeminal nerve is 5. RASH: Is there any redness, rash, or swelling of your face?     Not asked refusing  6. FEVER: Do you have a fever? If Yes, ask: What is it, how was it measured, and when did it start?      Refused most triage questions 7. OTHER SYMPTOMS: Do you have any other symptoms? (e.g., fever, toothache, nasal discharge, nasal congestion, clicking sensation in jaw joint)     Talking make pain intense.  8. PREGNANCY: Is there any chance you are pregnant? When was your last menstrual period?  Protocols used: Face  Pain-A-AH

## 2024-02-24 NOTE — Telephone Encounter (Signed)
 Copied from CRM #[id number]. Topic: Clinical - Red Word Triage >> Feb 24, 2024 11:31 AM Antwanette L wrote: Red Word that prompted transfer to Nurse Triage: Patient is calling because her trigeminal nerve is flaring, and it is causing significant discomfort/ pain

## 2024-02-24 NOTE — Discharge Instructions (Addendum)
 I believe that your facial pain is coming from a nerve.  I have sent a medication called gabapentin  for you to take for nerve pain.  Please schedule follow-up appointment with the neurologist whose information is attached.  Return to the emergency department with any worsening symptoms.

## 2024-03-03 ENCOUNTER — Ambulatory Visit: Payer: Self-pay

## 2024-03-03 DIAGNOSIS — Z9841 Cataract extraction status, right eye: Secondary | ICD-10-CM | POA: Diagnosis not present

## 2024-03-03 DIAGNOSIS — H26492 Other secondary cataract, left eye: Secondary | ICD-10-CM | POA: Diagnosis not present

## 2024-03-03 NOTE — Telephone Encounter (Signed)
 FYI Only or Action Required?: FYI only for provider: appointment scheduled on Tuesday.  Patient was last seen in primary care on 01/24/2024 by Randeen Laine LABOR, MD.  Called Nurse Triage reporting Facial Pain. - trigeminal nerve, also shoulder pain and arm pain  Symptoms began a week ago.  Interventions attempted: Other: Went to ED.  Symptoms are: gradually improving.  Triage Disposition: See HCP Within 4 Hours (Or PCP Triage) - Pt refuses ED or UC. As Ed was unable to help her.  Patient/caregiver understands and will follow disposition?: Unsure                   Copied from CRM [id number]. Topic: Clinical - Red Word Triage >> Mar 03, 2024  4:10 PM Thersia BROCKS wrote: [facility] that prompted transfer to Nurse Triage: Having alot of pain in shoulders or arm, stated it has been going on for a while >> Mar 03, 2024  5:19 PM Winona R wrote: Pain on right side of face, starting from lip through eye into her head. Exteremly painful by her nose when she talks. Went to the ER 12/04 and they didn't know what top do so they told her to follow up with pcp. She waited a few days and it still hasn't gotten better. Pt also called earlier today but was disconnected  >> Mar 03, 2024  4:22 PM Thersia C wrote: Patient disconnected while on hold  Reason for Disposition  [1] SEVERE pain (e.g., excruciating) AND [2] not improved after 2 hours of pain medicine  Answer Assessment - Initial Assessment Questions 1. ONSET: When did the pain start? (e.g., minutes, hours, days)     02/24/2024 2. ONSET: Does the pain come and go, or has it been constant since it started? (e.g., constant, intermittent, fleeting)     Constant but gets much worse if touched or she moves her face 3. SEVERITY: How bad is the pain? (Scale 1-10; mild, moderate or severe)     Moderate - severe 4. LOCATION: Where does it hurt?      Left side of face 5. RASH: Is there any redness, rash, or swelling of your face?      no 6. FEVER: Do you have a fever? If Yes, ask: What is it, how was it measured, and when did it start?      no 7. OTHER SYMPTOMS: Do you have any other symptoms? (e.g., fever, toothache, nasal discharge, nasal congestion, clicking sensation in jaw joint)     no  Protocols used: Face Pain-A-AH

## 2024-03-06 NOTE — Telephone Encounter (Signed)
 Will see her tomorrow as planned  Agree with ER/UC recommendations

## 2024-03-06 NOTE — Telephone Encounter (Unsigned)
 Copied from CRM #[id number]. Topic: Clinical - Red Word Triage >> Mar 03, 2024  4:54 PM Winona R wrote: Pain on right side of face, starting from lip through eye into her head. Exteremly painful by her nose when she talks. Went to the ER 12/04 and they didn't know what top do so they told her to follow up with pcp. She waited a few days and it still hasn't gotten better. >> Mar 03, 2024  5:23 PM Winona R wrote: Please disregard, I did not notice a crm was created for triage earlier today but call was disconnected. Pt never mentioned calling earlier nor did she receive a call back. I added my notes to the first CRM and pt was connected to Triage today at 5:21pm

## 2024-03-06 NOTE — Progress Notes (Deleted)
 Subjective:    Patient ID: [name], female    DOB: 05/13/1932, 89 y.o.   MRN: [id number]  HPI  Wt Readings from Last 3 Encounters:  02/24/24 150 lb (68 kg)  02/15/24 178 lb 8 oz (81 kg)  01/24/24 178 lb 4 oz (80.9 kg)      There were no vitals filed for this visit.  Pt presents for c/o   Trigeminal nerve pain   In past left sided facial pain and tingling  [facility] in [facility]/2021 Dr Gerome   Seen in ER on 12/4 for this  After sharp pain that shot from lip to eye that am She tried lidocaine   Had no neuro changes on exam  Considered carbamazepine  Ended up prescription low dose gabapentin  100 mg tid    CTA head/neck on 10/20 Narrative & Impression  CLINICAL DATA:  Dizziness   EXAM: CT ANGIOGRAPHY HEAD AND NECK WITH AND WITHOUT CONTRAST   TECHNIQUE: Multidetector CT imaging of the head and neck was performed using the standard protocol during bolus administration of intravenous contrast. Multiplanar CT image reconstructions and MIPs were obtained to evaluate the vascular anatomy. Carotid stenosis measurements (when applicable) are obtained utilizing NASCET criteria, using the distal internal carotid diameter as the denominator.   RADIATION DOSE REDUCTION: This exam was performed according to the departmental dose-optimization program which includes automated exposure control, adjustment of the mA and/or kV according to patient size and/or use of iterative reconstruction technique.   CONTRAST:  60mL OMNIPAQUE  IOHEXOL  350 MG/ML SOLN   COMPARISON:  MRI June 16, 2019, CT October 06, 2023   FINDINGS: CT HEAD:   There is an old right posterior frontal cortical infarct. There is a small old left occipital cortical infarct   There is no hemorrhage.   No acute ischemic changes.   No mass lesion.   The ventricles are normal.   Skull/sinuses/orbits:   No significant abnormality.   CTA NECK: CTA NECK Aortic arch: No  proximal vessel stenosis.   Right carotid: There is some calcified plaque with no stenosis.   Left carotid: There is calcified plaque with no stenosis.   Right vertebral: Normal   Left vertebral: There is calcium  at the origin.  No stenosis   Soft tissues: No significant abnormality   Other comments: None    CTA HEAD   Right anterior circulation: The internal carotid artery is patent without significant stenosis. The anterior and middle cerebral arteries are patent without significant stenosis or proximal branch occlusion. No aneurysm.   Left anterior circulation: The internal carotid artery is patent without significant stenosis. The anterior and middle cerebral arteries are patent without significant stenosis or proximal branch occlusion. No aneurysm.   Posterior circulation: Both vertebral arteries are patent. There is no significant basilar stenosis. Both posterior cerebral arteries are patent without significant stenosis or proximal branch occlusion. No aneurysm. The left posterior cerebral artery arises from the carotid primarily.   IMPRESSION: 1. No extracranial carotid artery stenosis 2. No significant vertebrobasilar or intracranial disease 3. Old infarcts in the right posterior frontal lobe and left occipital lobe    MR brain 2021 IMPRESSION: 1. 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 2. Solitary focus of petechial hemorrhage versus distal vessel thrombus, but no malignant hemorrhagic transformation or mass effect. 3. Otherwise stable chronic small vessel disease.        Patient Active  Problem List   Diagnosis Date Noted   Vertigo 01/24/2024   Right hip pain 12/31/2023   Medication management 11/18/2023   DDD (degenerative disc disease), lumbar 10/27/2023   Leg pain, bilateral 10/25/2023   Muscle cramps 10/19/2023   Decreased GFR 10/19/2023   Memory change 10/18/2023   Nonrheumatic  mitral valve regurgitation 09/21/2022   Other fatigue 10/08/2021   Salt craving 06/25/2021   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/20/2020   Hearing loss 06/13/2020   S/P placement of cardiac pacemaker MDT 07/28/19 07/29/2019   History of CVA (cerebrovascular accident) 06/16/2019   Facial tingling 11/29/2018   Tremor of left hand 11/29/2018   Iron  deficiency anemia 10/21/2017   Constipation 08/02/2017   Numbness in feet 07/14/2017   Hip osteoarthritis 04/27/2017   Long term (current) use of anticoagulants - on coumadin  for afib 03/04/2017   Venous stasis dermatitis of both lower extremities 01/08/2017   Osteopenia 10/25/2016   Pedal edema 08/26/2016   Varicose veins of both lower extremities 08/26/2016   Estrogen deficiency 08/26/2016   Hemorrhoids 08/26/2016   History of nonmelanoma skin cancer 01/01/2016   Pruritus 10/04/2015   Hip pain 08/02/2014   Left knee pain 08/02/2014   Chronic cough 05/08/2014   Fatigue 08/16/2013   Left ovarian cyst 03/14/2013   (HFpEF) heart failure with preserved ejection fraction (HCC) 12/27/2012   PULMONARY NODULE 12/20/2008   Ganglion 10/04/2007   Mixed incontinence 04/28/2007   Hyperlipidemia 04/27/2007   Depression with anxiety 04/27/2007   Asthma, mild intermittent 04/27/2007   Insomnia 04/27/2007   Acquired hypothyroidism 09/02/2006   Past Medical History:  Diagnosis Date   ADENOMATOUS COLONIC POLYP 11/04/2006   Qualifier: Diagnosis of   By: Earlean CMA (AAMA), Amanda         Allergic rhinitis    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 s/p PVI [name] 2010;  b. on tikosyn /coumadin ;  c. 05/2009 Echo: EF 60-65%, Gr 2 DD. (first dx 09/ 2007)   AV block 07/28/2019   Bilateral lower extremity edema    Bleeding hemorrhoid    Carotid stenosis    mild (hosp 3/11)- consult by vasc/ Dr Oris   Complication of anesthesia    hard to wake    Diverticulosis of colon    Dyspnea    on exertion-climbing stairs   Fatty liver    H/O cardiac radiofrequency ablation    01/ 2008 at [facility] [place]  /  03/ 2010  at [facility]   Heart failure with preserved ejection fraction [facility])    History of adenomatous polyp of colon    tubular adenoma's   History of cardiomyopathy    secondary tachycardia-induced cardiomyopathy -- resolved 2014   History of colonic polyps 09/18/2009   Qualifier: Diagnosis of   By: Genie CMA LEODIS), Chick      IMO SNOMED Dx Update Oct 2024   Formatting of this note might be different from the original. Qualifier: Diagnosis of By: Kowalk CMA LEODIS), Chick     History of squamous cell carcinoma in situ (SCCIS) of skin    05/ 2017  nasal bridge and right medial knee   History of transient ischemic attack (TIA)    01-24-2005 and 06-12-2009   Hyperlipidemia    Hypothyroidism    Mild intermittent asthma    reacts to cats   Mixed stress and  urge urinary incontinence    Presence of heart assist device (HCC) 03/25/2023   Presence of permanent cardiac pacemaker    was put in 07/2019   Pulmonary nodule    S/P AV nodal ablation 07/28/19 07/29/2019   S/P mitral valve repair 10-23-1998  dr lansing at [facility]   for MVP and regurg. 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   Trigeminal neuralgia    Past Surgical History:  Procedure Laterality Date   APPENDECTOMY  1978   AV NODE ABLATION N/A 07/28/2019   Procedure: AV NODE ABLATION;  Surgeon: Fernande Elspeth BROCKS, MD;  Location: [facility] INVASIVE CV LAB;  Service: Cardiovascular;  Laterality: N/A;   BUBBLE STUDY  06/19/2019   Procedure: BUBBLE STUDY;  Surgeon: Mona Vinie BROCKS, MD;  Location: [facility] ENDOSCOPY;  Service: Cardiovascular;;   CARDIAC ELECTROPHYSIOLOGY MAPPING AND ABLATION  01/ 2008    at [facility] [place]    right-sided ablation atrial flutter   CARDIAC  ELECTROPHYSIOLOGY STUDY AND ABLATION  03/ 2010   dr marland at [facility]   AV node ablation and pulmonary vein isolation for atrial fib   CARDIOVERSION  06-18-2006;  07-13-2006;  10-19-2010;  10-27-2010   CATARACT EXTRACTION W/PHACO Right 04/09/2021   Procedure: CATARACT EXTRACTION PHACO AND INTRAOCULAR LENS PLACEMENT (IOC) RIGHT 6.71 01:11.1;  Surgeon: Mittie Gaskin, MD;  Location: [facility] SURGERY CNTR;  Service: Ophthalmology;  Laterality: Right;   CATARACT EXTRACTION W/PHACO Left 04/23/2021   Procedure: CATARACT EXTRACTION PHACO AND INTRAOCULAR LENS PLACEMENT (IOC) LEFT;  Surgeon: Mittie Gaskin, MD;  Location: [facility];  Service: Ophthalmology;  Laterality: Left;  [place] 5.65 00:50.1   COLONOSCOPY     COLONOSCOPY WITH PROPOFOL  N/A 10/13/2017   Procedure: COLONOSCOPY WITH PROPOFOL ;  Surgeon: Therisa Bi, MD;  Location: [facility] ENDOSCOPY;  Service: Gastroenterology;  Laterality: N/A;   COLONOSCOPY WITH PROPOFOL  N/A 02/20/2020   Procedure: COLONOSCOPY WITH PROPOFOL ;  Surgeon: Maryruth Ole DASEN, MD;  Location: [facility] ENDOSCOPY;  Service: Endoscopy;  Laterality: N/A;   CYSTO/ TRANSURETHRAL COLLAGEN INJECTION THERAPY  07-26-2007   dr gaston   DILATION AND CURETTAGE OF UTERUS     ESOPHAGOGASTRODUODENOSCOPY (EGD) WITH PROPOFOL  N/A 10/13/2017   Procedure: ESOPHAGOGASTRODUODENOSCOPY (EGD) WITH PROPOFOL ;  Surgeon: Therisa Bi, MD;  Location[facility] ENDOSCOPY;  Service: Gastroenterology;  Laterality: N/A;   EVALUATION UNDER ANESTHESIA WITH HEMORRHOIDECTOMY N/A 04/09/2020   Procedure: EXAM UNDER ANESTHESIA WITH HEMORRHOIDECTOMY;  Surgeon: Jordis Laneta FALCON, MD;  Location: [facility] ORS;  Service: General;  Laterality: N/A;   EXCISIONAL HEMORRHOIDECTOMY  1980s   GIVENS CAPSULE STUDY N/A 12/08/2017   Procedure: GIVENS CAPSULE STUDY;  Surgeon: Therisa Bi, MD;  Location: [facility] ENDOSCOPY;  Service: Gastroenterology;  Laterality: N/A;   HEMORRHOID SURGERY N/A 10/29/2016   Procedure: HEMORRHOIDECTOMY;   Surgeon: Debby Hila, MD;  Location: [facility];  Service: General;  Laterality: N/A;   MITRAL VALVE ANNULOPLASTY  10/23/1998   Model [id number]; [place] [id number]; size 32mm; [facility]; Dr. Lansing   PACEMAKER IMPLANT N/A 07/28/2019   Procedure: PACEMAKER IMPLANT;  Surgeon: Fernande Elspeth BROCKS, MD;  Location: [facility] INVASIVE CV LAB;  Service: Cardiovascular;  Laterality: N/A;   PILONIDAL CYST EXCISION  1954   RIGHT/LEFT HEART CATH AND CORONARY ANGIOGRAPHY Bilateral 09/21/2022   Procedure: RIGHT/LEFT HEART CATH AND CORONARY ANGIOGRAPHY;  Surgeon: Darron Deatrice LABOR, MD;  Location: [facility] INVASIVE CV LAB;  Service: Cardiovascular;  Laterality: Bilateral;   TEE WITH  CARDIOVERSION  05-06-2006 at [facility];  01-02-2013 at [facility]   TEE WITHOUT CARDIOVERSION N/A 06/19/2019   Procedure: TRANSESOPHAGEAL ECHOCARDIOGRAM (TEE);  Surgeon: Mona Vinie BROCKS, MD;  Location: [facility] ENDOSCOPY;  Service: Cardiovascular;  Laterality: N/A;   TOTAL HIP ARTHROPLASTY Left 05/04/2017   Procedure: LEFT TOTAL HIP ARTHROPLASTY ANTERIOR APPROACH;  Surgeon: Vernetta Lonni GRADE, MD;  Location: [facility] OR;  Service: Orthopedics;  Laterality: Left;   TRANSTHORACIC ECHOCARDIOGRAM  05-01-2015   dr fernande   ef 50-55%/  mild AV sclerosis without stenosis/  post MV repair with mild central MR (valve area by pressure half-time 2cm^2,  valve area by continutity equation 0.91cm^2, peak grandiant 80mmHg)/  severe LAE/ mild TR/ mild RAE    TUBAL LIGATION Bilateral 1978   Social History[1] Family History  Problem Relation Age of Onset   Lung cancer Father        smoker, died at 67   Alcohol  abuse Father    Cancer Father        bladder and lung CA smoker   Sudden death Other    Breast cancer Neg Hx    Stroke Neg Hx    Allergies[2] Medications Ordered Prior to Encounter[3]  Review of Systems     Objective:   Physical Exam        Assessment & Plan:   Problem List Items Addressed This Visit   None     [1]  Social  History Tobacco Use   Smoking status: Never   Smokeless tobacco: Never  Vaping Use   Vaping status: Never Used  Substance Use Topics   Alcohol  use: Not Currently    Comment: seldom   Drug use: No  [2]  Allergies Allergen Reactions   Pacerone [Amiodarone] Swelling   Penicillins Hives and Rash    Tolerates ceftriaxone  during July 2025 admission   Zetia  [Ezetimibe ] Other (See Comments)    Unknown reaction   Lopressor  [Metoprolol ] Other (See Comments)    Hair loss   Statins Rash  [3]  Current Outpatient Medications on File Prior to Visit  Medication Sig Dispense Refill   acetaminophen  (TYLENOL ) 325 MG tablet Take 650 mg by mouth 2 (two) times daily as needed for moderate pain (pain score 4-6), fever or headache.     albuterol  (VENTOLIN  HFA) 108 (90 Base) MCG/ACT inhaler Inhale 1-2 puffs into the lungs every 6 (six) hours as needed for wheezing or shortness of breath. 18 g 2   apixaban  (ELIQUIS ) 5 MG TABS tablet Take 1 tablet (5 mg total) by mouth 2 (two) times daily. 60 tablet 5   bisoprolol  (ZEBETA ) 5 MG tablet Take 1 tablet (5 mg total) by mouth daily. 30 tablet 11   empagliflozin  (JARDIANCE ) 10 MG TABS tablet Take 1 tablet (10 mg total) by mouth daily before breakfast. PLEASE SCHEDULE APPOINTMENT FOR MORE REFILLS 30 tablet 6   ferrous sulfate  325 (65 FE) MG EC tablet Take 1 tablet (325 mg total) by mouth daily with breakfast. 90 tablet 2   fluorouracil (EFUDEX) 5 % cream Apply 1 Application topically daily.     fluticasone  (FLONASE ) 50 MCG/ACT nasal spray Place 1 spray into both nostrils 2 (two) times daily as needed for allergies or rhinitis. 16 g 11   furosemide  (LASIX ) 40 MG tablet Take 1 tablet (40 mg total) by mouth daily. 90 tablet 3   gabapentin  (NEURONTIN ) 100 MG capsule Take 1 capsule (100 mg total) by mouth 3 (three) times daily. 90 capsule 0   levothyroxine  (SYNTHROID ) 75  MCG tablet TAKE 1 TABLET EVERY DAY BEFORE BREAKFAST 90 tablet 1   losartan  (COZAAR ) 25 MG tablet TAKE  1 TABLET (25 MG TOTAL) BY MOUTH DAILY. 90 tablet 3   meclizine  (ANTIVERT ) 12.5 MG tablet Take 1 tablet (12.5 mg total) by mouth 3 (three) times daily as needed for dizziness. 30 tablet 0   mexiletine (MEXITIL ) 200 MG capsule TAKE 1 CAPSULE BY MOUTH TWICE A DAY 180 capsule 1   sertraline  (ZOLOFT ) 25 MG tablet TAKE 1 TABLET (25 MG TOTAL) BY MOUTH DAILY. IN EVENING 90 tablet 1   spironolactone  (ALDACTONE ) 25 MG tablet Take 0.5 tablets (12.5 mg total) by mouth daily. 45 tablet 3   No current facility-administered medications on file prior to visit.

## 2024-03-07 ENCOUNTER — Inpatient Hospital Stay: Admitting: Family Medicine

## 2024-03-07 DIAGNOSIS — R202 Paresthesia of skin: Secondary | ICD-10-CM

## 2024-03-10 ENCOUNTER — Other Ambulatory Visit: Payer: Self-pay

## 2024-03-13 MED ORDER — MEXILETINE HCL 200 MG PO CAPS: 200.0000 mg | ORAL_CAPSULE | Freq: Two times a day (BID) | ORAL | 1 refills | Status: AC

## 2024-03-19 ENCOUNTER — Other Ambulatory Visit: Payer: Self-pay | Admitting: Family Medicine

## 2024-03-21 ENCOUNTER — Encounter: Payer: Self-pay | Admitting: Oncology

## 2024-03-22 ENCOUNTER — Ambulatory Visit (INDEPENDENT_AMBULATORY_CARE_PROVIDER_SITE_OTHER): Payer: Medicare Other

## 2024-03-22 ENCOUNTER — Encounter: Payer: Self-pay | Admitting: Oncology

## 2024-03-22 VITALS — Ht 65.0 in | Wt 150.0 lb

## 2024-03-22 DIAGNOSIS — Z0000 Encounter for general adult medical examination without abnormal findings: Secondary | ICD-10-CM

## 2024-03-22 NOTE — Progress Notes (Signed)
 "  Chief Complaint  Patient presents with   Medicare Wellness     Subjective:   [name] is a 89 y.o. female who presents for a Medicare Annual Wellness Visit.  Visit info / Clinical Intake: Medicare Wellness Visit Type:: Subsequent Annual Wellness Visit Persons participating in visit and providing information:: patient Medicare Wellness Visit Mode:: Telephone If telephone:: video declined Since this visit was completed virtually, some vitals may be partially provided or unavailable. Missing vitals are due to the limitations of the virtual format.: Unable to obtain vitals - no equipment If Telephone or Video please confirm:: I connected with patient using audio/video enable telemedicine. I verified patient identity with two identifiers, discussed telehealth limitations, and patient agreed to proceed. Patient Location:: home Provider Location:: office Interpreter Needed?: No Pre-visit prep was completed: yes AWV questionnaire completed by patient prior to visit?: no Living arrangements:: with family/others (son lives with pt) Patient's Overall Health Status Rating: good Typical amount of pain: some Does pain affect daily life?: no Are you currently prescribed opioids?: no  Dietary Habits and Nutritional Risks How many meals a day?: (!) 1 Eats fruit and vegetables daily?: yes (not as much as should per pt) Most meals are obtained by: eating out In the last 2 weeks, have you had any of the following?: none Diabetic:: no  Functional Status Activities of Daily Living (to include ambulation/medication): Independent Ambulation: Independent with device- listed below Home Assistive Devices/Equipment: [name] (specify Type); Eyeglasses Medication Administration: Independent Home Management (perform basic housework or laundry): Independent Manage your own finances?: yes Primary transportation is: driving Concerns about vision?: no *vision screening is required for  WTM* Concerns about hearing?: (!) yes Uses hearing aids?: (!) yes Hear whispered voice?: (!) no *in-person visit only*  Fall Screening Falls in the past year?: 0 Number of falls in past year: 0 Was there an injury with Fall?: 0 Fall Risk Category Calculator: 0 Patient Fall Risk Level: Low Fall Risk  Fall Risk Patient at Risk for Falls Due to: Impaired mobility Fall risk Follow up: Falls evaluation completed; Education provided; Falls prevention discussed  Home and Transportation Safety: All rugs have non-skid backing?: N/A, no rugs All stairs or steps have railings?: N/A, no stairs Grab bars in the bathtub or shower?: (!) no Have non-skid surface in bathtub or shower?: yes Good home lighting?: yes Regular seat belt use?: yes Hospital stays in the last year:: (!) yes How many hospital stays:: 3 Reason: July 2025-altered mental status  Cognitive Assessment Difficulty concentrating, remembering, or making decisions? : yes (only remeber if IMPORTANT or relevant b/c every day is the same day except Sunday) Will 6CIT or Mini Cog be Completed: yes What year is it?: 4 points What month is it?: 3 points Give patient an address phrase to remember (5 components): [address] [place]  About what time is it?: 0 points Count backwards from 20 to 1: 0 points Say the months of the year in reverse: 0 points Repeat the address phrase from earlier: 2 points 6 CIT Score: 9 points  Advance Directives (For Healthcare) Does Patient Have a Medical Advance Directive?: Yes Does patient want to make changes to medical advance directive?: No - Patient declined Type of Advance Directive: Healthcare Power of [place]; Living will Copy of Healthcare Power of Attorney in Chart?: No - copy requested Copy of Living Will in Chart?: No - copy requested Would patient like information on creating a medical advance directive?: No - Patient declined  Reviewed/Updated  Reviewed/Updated:  Reviewed All (Medical, Surgical, Family, Medications, Allergies, Care Teams, Patient Goals)    Allergies (verified) Pacerone [amiodarone], Penicillins, Zetia  [ezetimibe ], Lopressor  [metoprolol ], and Statins   Current Medications (verified) Outpatient Encounter Medications as of 03/22/2024  Medication Sig   acetaminophen  (TYLENOL ) 325 MG tablet Take 650 mg by mouth 2 (two) times daily as needed for moderate pain (pain score 4-6), fever or headache.   albuterol  (VENTOLIN  HFA) 108 (90 Base) MCG/ACT inhaler Inhale 1-2 puffs into the lungs every 6 (six) hours as needed for wheezing or shortness of breath.   apixaban  (ELIQUIS ) 5 MG TABS tablet Take 1 tablet (5 mg total) by mouth 2 (two) times daily.   bisoprolol  (ZEBETA ) 5 MG tablet Take 1 tablet (5 mg total) by mouth daily.   empagliflozin  (JARDIANCE ) 10 MG TABS tablet Take 1 tablet (10 mg total) by mouth daily before breakfast. PLEASE SCHEDULE APPOINTMENT FOR MORE REFILLS   ferrous sulfate  325 (65 FE) MG EC tablet TAKE 1 TABLET BY MOUTH EVERY DAY WITH BREAKFAST   fluorouracil (EFUDEX) 5 % cream Apply 1 Application topically daily.   fluticasone  (FLONASE ) 50 MCG/ACT nasal spray Place 1 spray into both nostrils 2 (two) times daily as needed for allergies or rhinitis.   furosemide  (LASIX ) 40 MG tablet Take 1 tablet (40 mg total) by mouth daily.   gabapentin  (NEURONTIN ) 100 MG capsule Take 1 capsule (100 mg total) by mouth 3 (three) times daily.   levothyroxine  (SYNTHROID ) 75 MCG tablet TAKE 1 TABLET EVERY DAY BEFORE BREAKFAST   losartan  (COZAAR ) 25 MG tablet TAKE 1 TABLET (25 MG TOTAL) BY MOUTH DAILY.   meclizine  (ANTIVERT ) 12.5 MG tablet Take 1 tablet (12.5 mg total) by mouth 3 (three) times daily as needed for dizziness.   mexiletine (MEXITIL ) 200 MG capsule Take 1 capsule (200 mg total) by mouth 2 (two) times daily.   sertraline  (ZOLOFT ) 25 MG tablet TAKE 1 TABLET (25 MG TOTAL) BY MOUTH DAILY. IN EVENING   spironolactone  (ALDACTONE ) 25 MG tablet  Take 0.5 tablets (12.5 mg total) by mouth daily.   No facility-administered encounter medications on file as of 03/22/2024.    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 s/p PVI [name] 2010;  b. on tikosyn /coumadin ;  c. 05/2009 Echo: EF 60-65%, Gr 2 DD. (first dx 09/ 2007)   AV block 07/28/2019   Bilateral lower extremity edema    Bleeding hemorrhoid    Carotid stenosis    mild (hosp 3/11)- consult by vasc/ Dr Oris   Complication of anesthesia    hard to wake   Diverticulosis of colon    Dyspnea    on exertion-climbing stairs   Fatty liver    H/O cardiac radiofrequency ablation    01/ 2008 at [facility] [place]  /  03/ 2010  at [facility]   Heart failure with preserved ejection fraction [facility])    History of adenomatous polyp of colon    tubular adenoma's   History of cardiomyopathy    secondary tachycardia-induced cardiomyopathy -- resolved 2014   History of colonic polyps 09/18/2009   Qualifier: Diagnosis of   By: Genie  CMA LEODIS), Leisha      IMO SNOMED Dx Update Oct 2024   Formatting of this note might be different from the original. Qualifier: Diagnosis of By: Genie CMA LEODIS), Chick     History of squamous cell carcinoma in situ (SCCIS) of skin    05/ 2017  nasal bridge and right medial knee   History of transient ischemic attack (TIA)    01-24-2005 and 06-12-2009   Hyperlipidemia    Hypothyroidism    Mild intermittent asthma    reacts to cats   Mixed stress and urge urinary incontinence    Presence of heart assist device (HCC) 03/25/2023   Presence of permanent cardiac pacemaker    was put in 07/2019   Pulmonary nodule    S/P AV nodal ablation 07/28/19 07/29/2019   S/P mitral valve  repair 10-23-1998  dr lansing at [facility]   for MVP and regurg. 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 INVASIVE CV LAB;  Service: Cardiovascular;  Laterality: N/A;   BUBBLE STUDY  06/19/2019   Procedure: BUBBLE STUDY;  Surgeon: Mona Vinie BROCKS, MD;  Location: [facility] ENDOSCOPY;  Service: Cardiovascular;;   CARDIAC ELECTROPHYSIOLOGY MAPPING AND ABLATION  01/ 2008    at [facility] [place]    right-sided ablation atrial flutter   CARDIAC ELECTROPHYSIOLOGY STUDY AND ABLATION  03/ 2010   dr marland at [facility]   AV node ablation and pulmonary vein isolation for atrial fib   CARDIOVERSION  06-18-2006;  07-13-2006;  10-19-2010;  10-27-2010   CATARACT EXTRACTION W/PHACO Right 04/09/2021   Procedure: CATARACT EXTRACTION PHACO AND INTRAOCULAR LENS PLACEMENT (IOC) RIGHT 6.71 01:11.1;  Surgeon: Mittie Gaskin, MD;  Location: [facility] SURGERY CNTR;  Service: Ophthalmology;  Laterality: Right;   CATARACT EXTRACTION W/PHACO Left 04/23/2021   Procedure: CATARACT EXTRACTION PHACO AND INTRAOCULAR LENS PLACEMENT (IOC) LEFT;  Surgeon: Mittie Gaskin, MD;  Location: [name] SURGERY CNTR;  Service: Ophthalmology;  Laterality: Left;  [place] 5.65 00:50.1   COLONOSCOPY     COLONOSCOPY WITH PROPOFOL  N/A 10/13/2017   Procedure: COLONOSCOPY WITH PROPOFOL ;  Surgeon: Therisa Bi, MD;  Location: [facility] ENDOSCOPY;  Service: Gastroenterology;  Laterality: N/A;   COLONOSCOPY WITH PROPOFOL  N/A 02/20/2020   Procedure: COLONOSCOPY WITH PROPOFOL ;  Surgeon: Maryruth Ole DASEN, MD;  Location: [facility] ENDOSCOPY;  Service: Endoscopy;  Laterality: N/A;   CYSTO/ TRANSURETHRAL COLLAGEN INJECTION THERAPY  07-26-2007   dr  gaston   DILATION AND CURETTAGE OF UTERUS     ESOPHAGOGASTRODUODENOSCOPY (EGD) WITH PROPOFOL  N/A 10/13/2017   Procedure: ESOPHAGOGASTRODUODENOSCOPY (EGD) WITH PROPOFOL ;  Surgeon: Therisa Bi, MD;  Location: [facility] ENDOSCOPY;  Service: Gastroenterology;  Laterality: N/A;   EVALUATION UNDER ANESTHESIA WITH HEMORRHOIDECTOMY N/A 04/09/2020   Procedure: EXAM UNDER ANESTHESIA WITH HEMORRHOIDECTOMY;  Surgeon: Jordis Laneta FALCON, MD;  Location: [facility] ORS;  Service: General;  Laterality: N/A;   EXCISIONAL HEMORRHOIDECTOMY  1980s   GIVENS CAPSULE STUDY N/A 12/08/2017   Procedure: GIVENS CAPSULE STUDY;  Surgeon: Therisa Bi, MD;  Location: [facility] ENDOSCOPY;  Service: Gastroenterology;  Laterality: N/A;   HEMORRHOID SURGERY N/A 10/29/2016   Procedure: HEMORRHOIDECTOMY;  Surgeon: Debby Hila, MD;  Location: [facility];  Service: General;  Laterality: N/A;   MITRAL VALVE ANNULOPLASTY  10/23/1998   Model 4625; Seriel [id number]; size 32mm; [facility]; Dr. Lansing   PACEMAKER IMPLANT N/A 07/28/2019   Procedure: PACEMAKER IMPLANT;  Surgeon: Fernande Elspeth BROCKS, MD;  Location: [facility] INVASIVE CV LAB;  Service: Cardiovascular;  Laterality: N/A;   PILONIDAL CYST EXCISION  1954   RIGHT/LEFT HEART CATH AND CORONARY ANGIOGRAPHY Bilateral 09/21/2022   Procedure: RIGHT/LEFT HEART CATH AND CORONARY ANGIOGRAPHY;  Surgeon: Darron Deatrice LABOR, MD;  Location: [facility] INVASIVE CV LAB;  Service: Cardiovascular;  Laterality: Bilateral;   TEE WITH CARDIOVERSION  05-06-2006 at [facility];  01-02-2013 at [facility]   TEE WITHOUT CARDIOVERSION N/A 06/19/2019   Procedure: TRANSESOPHAGEAL ECHOCARDIOGRAM (TEE);  Surgeon: Mona Vinie BROCKS, MD;  Location: [facility] ENDOSCOPY;  Service: Cardiovascular;  Laterality: N/A;   TOTAL HIP ARTHROPLASTY Left 05/04/2017   Procedure: LEFT TOTAL HIP ARTHROPLASTY ANTERIOR APPROACH;  Surgeon: Vernetta Lonni GRADE, MD;  Location: [facility] OR;  Service: Orthopedics;  Laterality: Left;   TRANSTHORACIC ECHOCARDIOGRAM  05-01-2015    dr fernande   ef 50-55%/  mild AV sclerosis without stenosis/  post MV repair with mild central MR (valve area by pressure half-time 2cm^2,  valve area by continutity equation 0.91cm^2, peak grandiant 56mmHg)/  severe LAE/ mild TR/ mild RAE    TUBAL LIGATION Bilateral 1978   Family History  Problem Relation Age of Onset   Lung cancer Father        smoker, died at 35   Alcohol  abuse Father    Cancer Father        bladder and lung CA smoker   Sudden death Other    Breast cancer Neg Hx    Stroke Neg Hx    Social History   Occupational History   Occupation: Engineer, Drilling: RETIRED  Tobacco Use   Smoking status: Never   Smokeless tobacco: Never  Vaping Use   Vaping status: Never Used  Substance and Sexual Activity   Alcohol  use: Not Currently    Comment: seldom   Drug use: No   Sexual activity: Not Currently   Tobacco Counseling Counseling given: Not Answered  SDOH Screenings   Food Insecurity: No Food Insecurity (03/22/2024)  Housing: Low Risk (03/22/2024)  Transportation Needs: No Transportation Needs (03/22/2024)  Utilities: Not At Risk (03/22/2024)  Alcohol  Screen: Low Risk (03/22/2023)  Depression (PHQ2-9): Low Risk (03/22/2024)  Financial Resource Strain: Low Risk (03/22/2023)  Physical Activity: Inactive (03/22/2024)  Social Connections: Moderately Integrated (03/22/2024)  Stress: No Stress Concern Present (03/22/2024)  Tobacco Use: Low Risk (03/22/2024)  Health Literacy: Adequate Health Literacy (03/22/2024)   See flowsheets for full screening details  Depression Screen PHQ 2 & 9 Depression Scale- Over the past 2 weeks, how often have you been bothered by any of the following problems? Little interest or pleasure in doing things: 0 Feeling down, depressed, or hopeless (PHQ Adolescent also includes...irritable): 1 (coping with heart failure) PHQ-2 Total Score: 1 Trouble falling or staying asleep, or sleeping too much: 3 Feeling tired or having little  energy: 3 Poor appetite or overeating (PHQ Adolescent also includes...weight loss): 1 Feeling bad about yourself - or that you are a failure or have let yourself or your family down: 0 Trouble concentrating on things, such as reading the newspaper or watching television (PHQ Adolescent also includes...like school work): 0 Moving or speaking so slowly that other people could have noticed. Or the opposite - being so fidgety or restless that you have been moving around  a lot more than usual: 0 Thoughts that you would be better off dead, or of hurting yourself in some way: 0 PHQ-9 Total Score: 7 If you checked off any problems, how difficult have these problems made it for you to do your work, take care of things at home, or get along with other people?: Somewhat difficult  Depression Treatment Depression Interventions/Treatment : Medication     Goals Addressed               This Visit's Progress     COMPLETED: Increase physical activity (pt-stated)        Chair yoga Stretch class Walk more for exercise      Just to keep going as long as I can        Manage My Medicine   On track     Timeframe:  Long-Range Goal Priority:  Medium Start Date:     05/27/21                        Expected End Date: 05/28/22                     Follow Up Date March 2024   - call for medicine refill 2 or 3 days before it runs out - call if I am sick and can't take my medicine - keep a list of all the medicines I take; vitamins and herbals too    Why is this important?   These steps will help you keep on track with your medicines.   Notes:       COMPLETED: Patient Stated   Not on track     03/08/2020, I will continue to do water aerobics at the [facility] 3 days a week for 1 hour.             Objective:    Today's Vitals   03/22/24 0837  Weight: 150 lb (68 kg)  Height: 5' 5 (1.651 m)   Body mass index is 24.96 kg/m.  Hearing/Vision screen Vision Screening - Comments:: UTD w/visits to   Immunizations and Health Maintenance Health Maintenance  Topic Date Due   Mammogram  04/24/2020   Zoster Vaccines- Shingrix (2 of 2) 05/19/2022   Influenza Vaccine  06/20/2024 (Originally 10/22/2023)   COVID-19 Vaccine (1) 12/06/2024 (Originally 04/30/1933)   Medicare Annual Wellness (AWV)  10/05/2024   DTaP/Tdap/Td (3 - Td or Tdap) 10/01/2030   Pneumococcal Vaccine: 50+ Years  Completed   Bone Density Scan  Completed   Meningococcal B Vaccine  Aged Out        Assessment/Plan:  This is a routine wellness examination for [facility].  Patient Care Team: Tower, Laine LABOR, MD as PCP - General Fernande Elspeth BROCKS, MD (Inactive) as PCP - Electrophysiology (Cardiology)  I have personally reviewed and noted the following in the patients chart:   Medical and social history Use of alcohol , tobacco or illicit drugs  Current medications and supplements including opioid prescriptions. Functional ability and status Nutritional status Physical activity Advanced directives List of other physicians Hospitalizations, surgeries, and ER visits in previous 12 months Vitals Screenings to include cognitive, depression, and falls Referrals and appointments  No orders of the defined types were placed in this encounter.  In addition, I have reviewed and discussed with patient certain preventive protocols, quality metrics, and best practice recommendations. A written personalized care plan for preventive services as well as general preventive health recommendations were provided to  patient.   [name] LITTIE Saris, LPN   [id number]   No follow-ups on file.  After Visit Summary: (MyChart) Due to this being a telephonic visit, the after visit summary with patients personalized plan was offered to patient via MyChart   Nurse Notes: Patient advised to keep follow-up appointment with PCP (Jan 6,2026) Appointment(s) made: (AWV/CPEJan-2027) HM Addressed: Pt declines MMG and Zoster-says it will do her no good  now  APPT made for PCP first available and note sent to PCP   "

## 2024-03-22 NOTE — Patient Instructions (Signed)
 [name],  Thank you for taking the time for your Medicare Wellness Visit. I appreciate your continued commitment to your health goals. Please review the care plan we discussed, and feel free to reach out if I can assist you further.  Please note that Annual Wellness Visits do not include a physical exam. Some assessments may be limited, especially if the visit was conducted virtually. If needed, we may recommend an in-person follow-up with your provider.  Ongoing Care Seeing your primary care provider every 3 to 6 months helps us  monitor your health and provide consistent, personalized care.   Referrals If a referral was made during today's visit and you haven't received any updates within two weeks, please contact the referred provider directly to check on the status.  Recommended Screenings:  Health Maintenance  Topic Date Due   Breast Cancer Screening  04/24/2020   Zoster (Shingles) Vaccine (2 of 2) 05/19/2022   Flu Shot  06/20/2024*   COVID-19 Vaccine (1) 12/06/2024*   Medicare Annual Wellness Visit  10/05/2024   DTaP/Tdap/Td vaccine (3 - Td or Tdap) 10/01/2030   Pneumococcal Vaccine for age over 43  Completed   Osteoporosis screening with Bone Density Scan  Completed   Meningitis B Vaccine  Aged Out  *Topic was postponed. The date shown is not the original due date.       03/22/2024    8:44 AM  Advanced Directives  Does Patient Have a Medical Advance Directive? Yes  Type of Estate Agent of [place];Living will  Does patient want to make changes to medical advance directive? No - Patient declined  Copy of Healthcare Power of Attorney in Chart? No - copy requested    Vision: Annual vision screenings are recommended for early detection of glaucoma, cataracts, and diabetic retinopathy. These exams can also reveal signs of chronic conditions such as diabetes and high blood pressure.  Dental: Annual dental screenings help detect early signs of oral  cancer, gum disease, and other conditions linked to overall health, including heart disease and diabetes.

## 2024-03-27 NOTE — Progress Notes (Signed)
 "  Subjective:    Patient ID: [name], female    DOB: 24-Apr-1932, 89 y.o.   MRN: [id number]  HPI  Wt Readings from Last 3 Encounters:  03/28/24 177 lb (80.3 kg)  03/22/24 150 lb (68 kg)  02/24/24 150 lb (68 kg)   29.45 kg/m  Vitals:   03/28/24 1050  BP: (!) 90/52  Pulse: 73  Resp: 16  Temp: 97.8 F (36.6 C)  SpO2: 98%    Pt presents for c/o health concerns   Recent ED visit for facial pain/trigeminal neuralgia  Some concerns after recent phone visit for amw including mental status and elevated depression scores Appetite/ oral intake  Fatigue    Blood pressure is low today BP Readings from Last 3 Encounters:  03/28/24 (!) 90/52  02/24/24 132/71  02/15/24 127/60   Has CHF with preserved EF Also a fib  MR s/p valve surgery  Pacemaker   Lasix  40 mg daily  Spironolactone  12.5 mg daily  Bisoprolol  5 mg daily  Losartan  25 mg daily  Jardiance  10 mg daily Eliquis   From cardiology  Not light headed  But is really tired  Sleeping more  Not motivated to clean house etc   Not a lot of ankle edema Has gained weight   Appetite is not very good She nibbles through the day Is grazer  Is eating   Goes out for food -more days than not  Does not like to good   Sleeping more in general  Dozes off easily  Watches TV just a little bit     Lab Results  Component Value Date   NA 142 02/15/2024   K 4.5 02/15/2024   CO2 23 02/15/2024   GLUCOSE 80 02/15/2024   BUN 26 02/15/2024   CREATININE 0.91 02/15/2024   CALCIUM  9.4 02/15/2024   GFR 56.89 (L) 10/19/2023   EGFR 60 02/15/2024   GFRNONAA 55 (L) 01/10/2024    PHQ -9 score was 7  For cognition= 6 CIT score of 9 points   Last MMSE was in August - score of 26/30  Has neuro visit later this mo  Thinks memory is pretty good overall  Does not think it has worsened since we last assessed it   No problems with medication compliance       03/28/2024   11:00 AM 03/22/2024    8:57 AM 11/18/2023     9:40 AM 10/11/2023   11:14 AM 09/02/2023    9:26 AM  Depression screen PHQ 2/9  Decreased Interest 0 0 0 0 0  Down, Depressed, Hopeless 1 1 0 0 0  PHQ - 2 Score 1 1 0 0 0  Altered sleeping 0  3    Tired, decreased energy 3  3    Change in appetite 0  1    Feeling bad or failure about yourself  0  0    Trouble concentrating 0  0    Moving slowly or fidgety/restless 0  0    Suicidal thoughts 0  0    PHQ-9 Score 4  7     Difficult doing work/chores Not difficult at all  Somewhat difficult       Data saved with a previous flowsheet row definition      03/28/2024   11:01 AM 09/02/2023    9:26 AM  GAD 7 : Generalized Anxiety Score  Nervous, Anxious, on Edge 0 0  Control/stop worrying 0 0  Worry too much - different things  0 0  Trouble relaxing 0 0  Restless 0 0  Easily annoyed or irritable 0 0  Afraid - awful might happen 0 0  Total GAD 7 Score 0 0  Anxiety Difficulty Not difficult at all      Takes sertraline  25 mg daily for mood  Fees worn out more than depressed  Not interested in a lot  Goes to church on Sunday   Less socialization due to death of some of her friends  Son lives with her now - has apt in her house (oldest son) - is an introvert  Does not socialize with him a lot but nice to have him there     Gabapentin  for neurol pain   Lab Results  Component Value Date   VITAMINB12 254 10/19/2023   Lab Results  Component Value Date   HGBA1C 6.0 03/25/2023   HGBA1C 5.8 07/03/2022   HGBA1C 6.0 03/12/2022      Hypothyroid Lab Results  Component Value Date   TSH 4.14 10/19/2023   Levothyroxine  75 mcg daily  Lab Results  Component Value Date   NA 142 02/15/2024   K 4.5 02/15/2024   CO2 23 02/15/2024   GLUCOSE 80 02/15/2024   BUN 26 02/15/2024   CREATININE 0.91 02/15/2024   CALCIUM  9.4 02/15/2024   GFR 56.89 (L) 10/19/2023   EGFR 60 02/15/2024   GFRNONAA 55 (L) 01/10/2024   Lab Results  Component Value Date   WBC 3.8 (L) 01/10/2024   HGB 12.9  01/10/2024   HCT 40.7 01/10/2024   MCV 93.1 01/10/2024   PLT 137 (L) 01/10/2024   Lab Results  Component Value Date   IRON  52 10/19/2023   TIBC 350 12/28/2018   FERRITIN 147.2 10/19/2023      Patient Active Problem List   Diagnosis Date Noted   Hypotension 03/28/2024   Low vitamin B12 level 03/28/2024   Prediabetes 03/28/2024   Vertigo 01/24/2024   Right hip pain 12/31/2023   Medication management 11/18/2023   DDD (degenerative disc disease), lumbar 10/27/2023   Leg pain, bilateral 10/25/2023   Muscle cramps 10/19/2023   Decreased GFR 10/19/2023   Memory change 10/18/2023   Nonrheumatic mitral valve regurgitation 09/21/2022   Other fatigue 10/08/2021   Salt craving 06/25/2021   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/20/2020   Hearing loss 06/13/2020   S/P placement of cardiac pacemaker MDT 07/28/19 07/29/2019   History of CVA (cerebrovascular accident) 06/16/2019   Facial tingling 11/29/2018   Tremor of left hand 11/29/2018   Iron  deficiency anemia 10/21/2017   Constipation 08/02/2017   Numbness in feet 07/14/2017   Hip osteoarthritis 04/27/2017   Long term (current) use of anticoagulants - on coumadin  for afib 03/04/2017   Venous stasis dermatitis of both lower extremities 01/08/2017   Osteopenia 10/25/2016   Pedal edema 08/26/2016   Varicose veins of both lower extremities 08/26/2016   Estrogen deficiency 08/26/2016   Hemorrhoids 08/26/2016   History of nonmelanoma skin cancer 01/01/2016   Pruritus 10/04/2015   Hip pain 08/02/2014   Left knee pain 08/02/2014   Chronic cough 05/08/2014   Fatigue 08/16/2013   Left ovarian cyst 03/14/2013   (HFpEF) heart failure with preserved ejection fraction (HCC) 12/27/2012   PULMONARY NODULE 12/20/2008   Ganglion 10/04/2007   Mixed incontinence 04/28/2007   Hyperlipidemia 04/27/2007   Depression with anxiety 04/27/2007   Asthma, mild intermittent 04/27/2007   Insomnia 04/27/2007   Acquired hypothyroidism 09/02/2006   Past  Medical History:  Diagnosis Date  ADENOMATOUS COLONIC POLYP 11/04/2006   Qualifier: Diagnosis of   By: Lewellyn CMA (AAMA), Amanda         Allergic rhinitis    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 s/p PVI [name] 2010;  b. on tikosyn /coumadin ;  c. 05/2009 Echo: EF 60-65%, Gr 2 DD. (first dx 09/ 2007)   AV block 07/28/2019   Bilateral lower extremity edema    Bleeding hemorrhoid    Carotid stenosis    mild (hosp 3/11)- consult by vasc/ Dr Oris   Complication of anesthesia    hard to wake   Diverticulosis of colon    Dyspnea    on exertion-climbing stairs   Fatty liver    H/O cardiac radiofrequency ablation    01/ 2008 at [facility] [place]  /  03/ 2010  at [facility]   Heart failure with preserved ejection fraction [facility])    History of adenomatous polyp of colon    tubular adenoma's   History of cardiomyopathy    secondary tachycardia-induced cardiomyopathy -- resolved 2014   History of colonic polyps 09/18/2009   Qualifier: Diagnosis of   By: Genie CMA LEODIS), Chick      IMO SNOMED Dx Update Oct 2024   Formatting of this note might be different from the original. Qualifier: Diagnosis of By: Kowalk CMA LEODIS), Chick     History of squamous cell carcinoma in situ (SCCIS) of skin    05/ 2017  nasal bridge and right medial knee   History of transient ischemic attack (TIA)    01-24-2005 and 06-12-2009   Hyperlipidemia    Hypothyroidism    Mild intermittent asthma    reacts to cats   Mixed stress and urge urinary incontinence    Presence of heart assist device (HCC) 03/25/2023   Presence of permanent cardiac pacemaker    was put in 07/2019   Pulmonary nodule    S/P AV nodal ablation 07/28/19 07/29/2019   S/P mitral valve repair 10-23-1998  dr lansing at [facility]   for MVP and regurg. (annuloplasty ring procedure)   S/P placement of cardiac pacemaker MDT  07/28/19 07/29/2019   Status post total replacement of left hip 05/04/2017   Trigeminal neuralgia    Past Surgical History:  Procedure Laterality Date   APPENDECTOMY  1978   AV NODE ABLATION N/A 07/28/2019   Procedure: AV NODE ABLATION;  Surgeon: Fernande Elspeth BROCKS, MD;  Location: [facility] INVASIVE CV LAB;  Service: Cardiovascular;  Laterality: N/A;   BUBBLE STUDY  06/19/2019   Procedure: BUBBLE STUDY;  Surgeon: Mona Vinie BROCKS, MD;  Location: [facility] ENDOSCOPY;  Service: Cardiovascular;;   CARDIAC ELECTROPHYSIOLOGY MAPPING AND ABLATION  01/ 2008    at [facility] [place]    right-sided ablation atrial flutter   CARDIAC ELECTROPHYSIOLOGY STUDY AND ABLATION  03/ 2010   dr ogles at [facility]   AV node ablation and pulmonary vein isolation for atrial fib   CARDIOVERSION  06-18-2006;  07-13-2006;  10-19-2010;  10-27-2010   CATARACT EXTRACTION W/PHACO Right 04/09/2021   Procedure: CATARACT EXTRACTION PHACO AND INTRAOCULAR LENS PLACEMENT (IOC) RIGHT 6.71 01:11.1;  Surgeon: Mittie Gaskin, MD;  Location: [facility]ERY [facility];  Service: Ophthalmology;  Laterality: Right;   CATARACT EXTRACTION W/PHACO Left 04/23/2021   Procedure: CATARACT EXTRACTION PHACO AND INTRAOCULAR LENS PLACEMENT (IOC) LEFT;  Surgeon: Mittie Gaskin, MD;  Location: [facility];  Service: Ophthalmology;  Laterality: Left;  [place] 5.65 00:50.1   COLONOSCOPY     COLONOSCOPY WITH PROPOFOL  N/A 10/13/2017   Procedure: COLONOSCOPY WITH PROPOFOL ;  Surgeon: Therisa Bi, MD;  Location: [facility] ENDOSCOPY;  Service: Gastroenterology;  Laterality: N/A;   COLONOSCOPY WITH PROPOFOL  N/A 02/20/2020   Procedure: COLONOSCOPY WITH PROPOFOL ;  Surgeon: Maryruth Ole DASEN, MD;  Location: [facility] ENDOSCOPY;  Service: Endoscopy;  Laterality: N/A;   CYSTO/ TRANSURETHRAL COLLAGEN INJECTION THERAPY  07-26-2007   dr gaston   DILATION AND CURETTAGE OF UTERUS     ESOPHAGOGASTRODUODENOSCOPY (EGD) WITH PROPOFOL  N/A 10/13/2017   Procedure:  ESOPHAGOGASTRODUODENOSCOPY (EGD) WITH PROPOFOL ;  Surgeon: Therisa Bi, MD;  Location: [facility] ENDOSCOPY;  Service: Gastroenterology;  Laterality: N/A;   EVALUATION UNDER ANESTHESIA WITH HEMORRHOIDECTOMY N/A 04/09/2020   Procedure: EXAM UNDER ANESTHESIA WITH HEMORRHOIDECTOMY;  Surgeon: Jordis Laneta FALCON, MD;  Location: [facility] ORS;  Service: General;  Laterality: N/A;   EXCISIONAL HEMORRHOIDECTOMY  1980s   GIVENS CAPSULE STUDY N/A 12/08/2017   Procedure: GIVENS CAPSULE STUDY;  Surgeon: Therisa Bi, MD;  Location: [facility] ENDOSCOPY;  Service: Gastroenterology;  Laterality: N/A;   HEMORRHOID SURGERY N/A 10/29/2016   Procedure: HEMORRHOIDECTOMY;  Surgeon: Debby Hila, MD;  Location: [facility];  Service: General;  Laterality: N/A;   MITRAL VALVE ANNULOPLASTY  10/23/1998   Model [id number]; [place] [id number]; size 32mm; [facility]; Dr. Lansing   PACEMAKER IMPLANT N/A 07/28/2019   Procedure: PACEMAKER IMPLANT;  Surgeon: Fernande Elspeth BROCKS, MD;  Location: [facility] INVASIVE CV LAB;  Service: Cardiovascular;  Laterality: N/A;   PILONIDAL CYST EXCISION  1954   RIGHT/LEFT HEART CATH AND CORONARY ANGIOGRAPHY Bilateral 09/21/2022   Procedure: RIGHT/LEFT HEART CATH AND CORONARY ANGIOGRAPHY;  Surgeon: Darron Deatrice LABOR, MD;  Location: [facility] INVASIVE CV LAB;  Service: Cardiovascular;  Laterality: Bilateral;   TEE WITH CARDIOVERSION  05-06-2006 at [facility];  01-02-2013 at [facility]   TEE WITHOUT CARDIOVERSION N/A 06/19/2019   Procedure: TRANSESOPHAGEAL ECHOCARDIOGRAM (TEE);  Surgeon: Mona Vinie BROCKS, MD;  Location: [facility] ENDOSCOPY;  Service: Cardiovascular;  Laterality: N/A;   TOTAL HIP ARTHROPLASTY Left 05/04/2017   Procedure: LEFT TOTAL HIP ARTHROPLASTY ANTERIOR APPROACH;  Surgeon: Vernetta Lonni GRADE, MD;  Location: [facility] OR;  Service: Orthopedics;  Laterality: Left;   TRANSTHORACIC ECHOCARDIOGRAM  05-01-2015   dr fernande   ef 50-55%/  mild AV sclerosis without stenosis/  post MV repair with mild central MR (valve area by pressure  half-time 2cm^2,  valve area by continutity equation 0.91cm^2, [employer] grandiant 64mmHg)/  severe LAE/ mild TR/ mild RAE    TUBAL LIGATION Bilateral 1978   Social History[1] Family History  Problem Relation Age of Onset   Lung cancer Father        smoker, died at 49   Alcohol  abuse Father    Cancer Father        bladder and lung CA smoker   Sudden death Other    Breast cancer Neg Hx    Stroke Neg Hx    Allergies[2] Medications Ordered Prior to Encounter[3]  Review of Systems  Constitutional:  Positive for fatigue. Negative for activity change, appetite change, fever and unexpected weight change.  HENT:  Negative for congestion, ear pain, rhinorrhea, sinus pressure and sore throat.   Eyes:  Negative for pain, redness and visual disturbance.  Respiratory:  Negative for cough, shortness of breath and wheezing.   Cardiovascular:  Negative for chest pain and palpitations.  Gastrointestinal:  Negative for abdominal pain, blood  in stool, constipation and diarrhea.  Endocrine: Negative for polydipsia and polyuria.  Genitourinary:  Negative for dysuria, frequency and urgency.  Musculoskeletal:  Negative for arthralgias, back pain and myalgias.  Skin:  Negative for pallor and rash.  Allergic/Immunologic: Negative for environmental allergies.  Neurological:  Negative for dizziness, syncope and headaches.  Hematological:  Negative for adenopathy. Does not bruise/bleed easily.  Psychiatric/Behavioral:  Positive for sleep disturbance. Negative for decreased concentration, dysphoric mood and suicidal ideas. The patient is not nervous/anxious.        Lack of motivation and fatigue        Objective:   Physical Exam Constitutional:      General: She is not in acute distress.    Appearance: Normal appearance. She is well-developed and normal weight. She is not ill-appearing or diaphoretic.  HENT:     Head: Normocephalic and atraumatic.  Eyes:     Conjunctiva/sclera: Conjunctivae normal.      Pupils: Pupils are equal, round, and reactive to light.  Neck:     Thyroid : No thyromegaly.     Vascular: No carotid bruit or JVD.  Cardiovascular:     Rate and Rhythm: Normal rate and regular rhythm.     Heart sounds: Normal heart sounds.     No gallop.  Pulmonary:     Effort: Pulmonary effort is normal. No respiratory distress.     Breath sounds: Normal breath sounds. No wheezing or rales.  Abdominal:     General: There is no distension or abdominal bruit.     Palpations: Abdomen is soft.  Musculoskeletal:     Cervical back: Normal range of motion and neck supple.     Right lower leg: No edema.     Left lower leg: No edema.  Lymphadenopathy:     Cervical: No cervical adenopathy.  Skin:    General: Skin is warm and dry.     Coloration: Skin is not pale.     Findings: No rash.  Neurological:     Mental Status: She is alert.     Coordination: Coordination normal.     Deep Tendon Reflexes: Reflexes are normal and symmetric. Reflexes normal.  Psychiatric:        Attention and Perception: Attention normal.        Mood and Affect: Mood normal.        Behavior: Behavior normal.        Cognition and Memory: She exhibits impaired recent memory.     Comments: Repeats herself   Candidly discusses symptoms and stressors    Is fairly sharp in conversatoin            Assessment & Plan:   Problem List Items Addressed This Visit       Cardiovascular and Mediastinum   Hypotension   Blood pressure is low today  Will reach out to cardiology regarding this  Takes  Lasix  40 mg daily  Spironolactone  12.5 mg daily  Bisoprolol  5 mg daily  Losartan  25 mg daily  Jardiance  10 mg daily  Pulse stable at 73  Occational feels dizzy -encouraged to change position slowly        Endocrine   Acquired hypothyroidism   More fatigued  Trouble concentrating  Stable exam but some weight gain noted   Takes levothyroxine  75 mcg daily   TSH ordered         Other   Prediabetes    A1c today Weight gain noted (without edema)  Relevant Orders   Hemoglobin A1c   Memory change   With fatigue Does repeat her self several times today but fairly sharp in conversation Lives with her son now States she is compliant with medicines   Mmse 26 in summer   Lab today Plan to schedule follow up for memory      Low vitamin B12 level   Last B12 just over 200 Re check today  More fatigued       Relevant Orders   Vitamin B12   Iron  deficiency anemia   More tired  Cbc and iron  level today   Is taking oral iron  -will hold off on refill until labs return   Past rectal bleeding On anti coag   Hematology in past for iron  infusions       Relevant Orders   Iron    CBC with Differential/Platelet   Hyperlipidemia   Fatigue - Primary   May be multi factorial  Lab today  Mood seems stable, some concern re: memory and cognition   Also consider possible of OSA (unrestful sleep /day time somnolence) Given info to read  Will likely refer to pulmonary for further eval if open to it        Relevant Orders   Iron    CBC with Differential/Platelet   Vitamin B12   TSH   Comprehensive metabolic panel with GFR   Depression with anxiety   Per pt stable Does not feel depressed  PHQ 4 mostly due to fatigued   Continues sertraline  25 mg daily   Noted- memory issues/will plan follow up to re check       Decreased GFR   Lab ordered Encouraged to keep up a good fluid intake       Relevant Orders   TSH      [1]  Social History Tobacco Use   Smoking status: Never   Smokeless tobacco: Never  Vaping Use   Vaping status: Never Used  Substance Use Topics   Alcohol  use: Not Currently    Comment: seldom   Drug use: No  [2]  Allergies Allergen Reactions   Pacerone [Amiodarone] Swelling   Penicillins Hives and Rash    Tolerates ceftriaxone  during July 2025 admission   Zetia  [Ezetimibe ] Other (See Comments)    Unknown reaction   Lopressor  [Metoprolol ]  Other (See Comments)    Hair loss   Statins Rash  [3]  Current Outpatient Medications on File Prior to Visit  Medication Sig Dispense Refill   acetaminophen  (TYLENOL ) 325 MG tablet Take 650 mg by mouth 2 (two) times daily as needed for moderate pain (pain score 4-6), fever or headache.     albuterol  (VENTOLIN  HFA) 108 (90 Base) MCG/ACT inhaler Inhale 1-2 puffs into the lungs every 6 (six) hours as needed for wheezing or shortness of breath. 18 g 2   apixaban  (ELIQUIS ) 5 MG TABS tablet Take 1 tablet (5 mg total) by mouth 2 (two) times daily. 60 tablet 5   bisoprolol  (ZEBETA ) 5 MG tablet Take 1 tablet (5 mg total) by mouth daily. 30 tablet 11   empagliflozin  (JARDIANCE ) 10 MG TABS tablet Take 1 tablet (10 mg total) by mouth daily before breakfast. PLEASE SCHEDULE APPOINTMENT FOR MORE REFILLS 30 tablet 6   ferrous sulfate  325 (65 FE) MG EC tablet TAKE 1 TABLET BY MOUTH EVERY DAY WITH BREAKFAST 90 tablet 1   fluorouracil (EFUDEX) 5 % cream Apply 1 Application topically daily.     fluticasone  (FLONASE ) 50 MCG/ACT nasal  spray Place 1 spray into both nostrils 2 (two) times daily as needed for allergies or rhinitis. 16 g 11   furosemide  (LASIX ) 40 MG tablet Take 1 tablet (40 mg total) by mouth daily. 90 tablet 3   gabapentin  (NEURONTIN ) 100 MG capsule Take 1 capsule (100 mg total) by mouth 3 (three) times daily. 90 capsule 0   levothyroxine  (SYNTHROID ) 75 MCG tablet TAKE 1 TABLET EVERY DAY BEFORE BREAKFAST 90 tablet 1   losartan  (COZAAR ) 25 MG tablet TAKE 1 TABLET (25 MG TOTAL) BY MOUTH DAILY. 90 tablet 3   meclizine  (ANTIVERT ) 12.5 MG tablet Take 1 tablet (12.5 mg total) by mouth 3 (three) times daily as needed for dizziness. 30 tablet 0   mexiletine (MEXITIL ) 200 MG capsule Take 1 capsule (200 mg total) by mouth 2 (two) times daily. 180 capsule 1   sertraline  (ZOLOFT ) 25 MG tablet TAKE 1 TABLET (25 MG TOTAL) BY MOUTH DAILY. IN EVENING 90 tablet 1   spironolactone  (ALDACTONE ) 25 MG tablet Take 0.5  tablets (12.5 mg total) by mouth daily. 45 tablet 3   No current facility-administered medications on file prior to visit.   "

## 2024-03-28 ENCOUNTER — Encounter: Payer: Self-pay | Admitting: Family Medicine

## 2024-03-28 ENCOUNTER — Ambulatory Visit: Payer: Self-pay | Admitting: Family Medicine

## 2024-03-28 ENCOUNTER — Ambulatory Visit (INDEPENDENT_AMBULATORY_CARE_PROVIDER_SITE_OTHER): Admitting: Family Medicine

## 2024-03-28 VITALS — BP 90/52 | HR 73 | Temp 97.8°F | Resp 16 | Ht 65.0 in | Wt 177.0 lb

## 2024-03-28 DIAGNOSIS — E7849 Other hyperlipidemia: Secondary | ICD-10-CM | POA: Diagnosis not present

## 2024-03-28 DIAGNOSIS — R5382 Chronic fatigue, unspecified: Secondary | ICD-10-CM

## 2024-03-28 DIAGNOSIS — R7303 Prediabetes: Secondary | ICD-10-CM | POA: Insufficient documentation

## 2024-03-28 DIAGNOSIS — R413 Other amnesia: Secondary | ICD-10-CM

## 2024-03-28 DIAGNOSIS — D500 Iron deficiency anemia secondary to blood loss (chronic): Secondary | ICD-10-CM

## 2024-03-28 DIAGNOSIS — I959 Hypotension, unspecified: Secondary | ICD-10-CM | POA: Diagnosis not present

## 2024-03-28 DIAGNOSIS — R944 Abnormal results of kidney function studies: Secondary | ICD-10-CM | POA: Diagnosis not present

## 2024-03-28 DIAGNOSIS — E039 Hypothyroidism, unspecified: Secondary | ICD-10-CM | POA: Diagnosis not present

## 2024-03-28 DIAGNOSIS — F418 Other specified anxiety disorders: Secondary | ICD-10-CM

## 2024-03-28 DIAGNOSIS — R7989 Other specified abnormal findings of blood chemistry: Secondary | ICD-10-CM | POA: Diagnosis not present

## 2024-03-28 LAB — COMPREHENSIVE METABOLIC PANEL WITH GFR
ALT: 7 U/L (ref 3–35)
AST: 13 U/L (ref 5–37)
Albumin: 4.1 g/dL (ref 3.5–5.2)
Alkaline Phosphatase: 44 U/L (ref 39–117)
BUN: 28 mg/dL — ABNORMAL HIGH (ref 6–23)
CO2: 33 meq/L — ABNORMAL HIGH (ref 19–32)
Calcium: 9.2 mg/dL (ref 8.4–10.5)
Chloride: 102 meq/L (ref 96–112)
Creatinine, Ser: 1.29 mg/dL — ABNORMAL HIGH (ref 0.40–1.20)
GFR: 36.82 mL/min — ABNORMAL LOW
Glucose, Bld: 112 mg/dL — ABNORMAL HIGH (ref 70–99)
Potassium: 3.9 meq/L (ref 3.5–5.1)
Sodium: 143 meq/L (ref 135–145)
Total Bilirubin: 0.7 mg/dL (ref 0.2–1.2)
Total Protein: 6.9 g/dL (ref 6.0–8.3)

## 2024-03-28 LAB — CBC WITH DIFFERENTIAL/PLATELET
Basophils Absolute: 0 K/uL (ref 0.0–0.1)
Basophils Relative: 0.9 % (ref 0.0–3.0)
Eosinophils Absolute: 0.1 K/uL (ref 0.0–0.7)
Eosinophils Relative: 2.5 % (ref 0.0–5.0)
HCT: 37 % (ref 36.0–46.0)
Hemoglobin: 12.2 g/dL (ref 12.0–15.0)
Lymphocytes Relative: 23.7 % (ref 12.0–46.0)
Lymphs Abs: 1 K/uL (ref 0.7–4.0)
MCHC: 33 g/dL (ref 30.0–36.0)
MCV: 88.7 fl (ref 78.0–100.0)
Monocytes Absolute: 0.4 K/uL (ref 0.1–1.0)
Monocytes Relative: 10.5 % (ref 3.0–12.0)
Neutro Abs: 2.6 K/uL (ref 1.4–7.7)
Neutrophils Relative %: 62.4 % (ref 43.0–77.0)
Platelets: 170 K/uL (ref 150.0–400.0)
RBC: 4.17 Mil/uL (ref 3.87–5.11)
RDW: 15.2 % (ref 11.5–15.5)
WBC: 4.2 K/uL (ref 4.0–10.5)

## 2024-03-28 LAB — VITAMIN B12: Vitamin B-12: 300 pg/mL (ref 211–911)

## 2024-03-28 LAB — IRON: Iron: 63 ug/dL (ref 42–145)

## 2024-03-28 LAB — HEMOGLOBIN A1C: Hgb A1c MFr Bld: 5.9 % (ref 4.6–6.5)

## 2024-03-28 LAB — TSH: TSH: 6.36 u[IU]/mL — ABNORMAL HIGH (ref 0.35–5.50)

## 2024-03-28 MED ORDER — FERROUS SULFATE 325 (65 FE) MG PO TBEC: 325.0000 mg | DELAYED_RELEASE_TABLET | Freq: Every day | ORAL | 0 refills | Status: AC

## 2024-03-28 NOTE — Assessment & Plan Note (Signed)
 More fatigued  Trouble concentrating  Stable exam but some weight gain noted   Takes levothyroxine  75 mcg daily   TSH ordered

## 2024-03-28 NOTE — Patient Instructions (Addendum)
 I will reach out to cardiology about your blood pressure  It is low today That could make you light headed  (if you get more dizzy let us  know)   Labs for fatigue today   Read about sleep apnea  I would like to get you tested -but tell me what you think   Be as active as you can   We will reach out after your labs return and make a plan

## 2024-03-28 NOTE — Assessment & Plan Note (Signed)
 Per pt stable Does not feel depressed  PHQ 4 mostly due to fatigued   Continues sertraline  25 mg daily   Noted- memory issues/will plan follow up to re check

## 2024-03-28 NOTE — Assessment & Plan Note (Signed)
 Last B12 just over 200 Re check today  More fatigued

## 2024-03-28 NOTE — Assessment & Plan Note (Signed)
 Blood pressure is low today  Will reach out to cardiology regarding this  Takes  Lasix  40 mg daily  Spironolactone  12.5 mg daily  Bisoprolol  5 mg daily  Losartan  25 mg daily  Jardiance  10 mg daily  Pulse stable at 73  Occational feels dizzy -encouraged to change position slowly

## 2024-03-28 NOTE — Assessment & Plan Note (Signed)
 With fatigue Does repeat her self several times today but fairly sharp in conversation Lives with her son now States she is compliant with medicines   Mmse 26 in summer   Lab today Plan to schedule follow up for memory

## 2024-03-28 NOTE — Assessment & Plan Note (Signed)
 More tired  Cbc and iron  level today   Is taking oral iron  -will hold off on refill until labs return   Past rectal bleeding On anti coag   Hematology in past for iron  infusions

## 2024-03-28 NOTE — Assessment & Plan Note (Signed)
 A1c today Weight gain noted (without edema)

## 2024-03-28 NOTE — Assessment & Plan Note (Signed)
 Lab ordered Encouraged to keep up a good fluid intake

## 2024-03-28 NOTE — Assessment & Plan Note (Signed)
 May be multi factorial  Lab today  Mood seems stable, some concern re: memory and cognition   Also consider possible of OSA (unrestful sleep /day time somnolence) Given info to read  Will likely refer to pulmonary for further eval if open to it

## 2024-04-05 ENCOUNTER — Other Ambulatory Visit

## 2024-04-05 MED ORDER — LEVOTHYROXINE SODIUM 88 MCG PO TABS: 88.0000 ug | ORAL_TABLET | Freq: Every day | ORAL | 0 refills | Status: AC

## 2024-04-10 ENCOUNTER — Other Ambulatory Visit (INDEPENDENT_AMBULATORY_CARE_PROVIDER_SITE_OTHER)

## 2024-04-10 ENCOUNTER — Ambulatory Visit: Payer: Self-pay | Admitting: Family Medicine

## 2024-04-10 DIAGNOSIS — R944 Abnormal results of kidney function studies: Secondary | ICD-10-CM

## 2024-04-10 DIAGNOSIS — R5382 Chronic fatigue, unspecified: Secondary | ICD-10-CM

## 2024-04-10 DIAGNOSIS — R8290 Unspecified abnormal findings in urine: Secondary | ICD-10-CM

## 2024-04-10 LAB — POC URINALSYSI DIPSTICK (AUTOMATED)
Bilirubin, UA: NEGATIVE
Blood, UA: 10
Glucose, UA: POSITIVE — AB
Ketones, UA: NEGATIVE
Nitrite, UA: NEGATIVE
Protein, UA: NEGATIVE
Spec Grav, UA: 1.01
Urobilinogen, UA: 0.2 U/dL
pH, UA: 6

## 2024-04-11 LAB — URINE CULTURE
MICRO NUMBER:: 17485879
Result:: NO GROWTH
SPECIMEN QUALITY:: ADEQUATE

## 2024-04-13 ENCOUNTER — Telehealth: Payer: Self-pay | Admitting: Pharmacy Technician

## 2024-04-13 ENCOUNTER — Telehealth: Payer: Self-pay | Admitting: Cardiology

## 2024-04-13 ENCOUNTER — Encounter: Payer: Self-pay | Admitting: Oncology

## 2024-04-13 ENCOUNTER — Other Ambulatory Visit (HOSPITAL_COMMUNITY): Payer: Self-pay

## 2024-04-13 NOTE — Telephone Encounter (Signed)
 Pt c/o medication issue:  1. Name of Medication:   mexiletine (MEXITIL ) 200 MG capsule    2. How are you currently taking this medication (dosage and times per day)? As written   3. Are you having a reaction (difficulty breathing--STAT)? No   4. What is your medication issue? Pt called in stating this medication is $400. Are there any cheaper options for her?

## 2024-04-13 NOTE — Telephone Encounter (Signed)
 Pharmacy Patient Advocate Encounter   Received notification from Pt Calls Messages that prior authorization for meitil is required/requested.   Insurance verification completed.   The patient is insured through [employer].   Per test claim: PA required; PA submitted to above mentioned insurance via Latent Key/confirmation #/EOC [id number] Status is pending

## 2024-04-13 NOTE — Telephone Encounter (Signed)
 Pt called back.  Aware we are awaiting medication assistance team to review.  Aware I am sending to EP scheduler to arrange overdue follow up with Dr. Duana Needle NP (pt has a device).  Aware that this and several other medications she is on may be challenging for her to afford beginning of each year.  Educated about yearly deductible and to call Medicare to find out what her OOP deductible is.

## 2024-04-13 NOTE — Telephone Encounter (Signed)
 Pt calling again to f/u on this. Rx was $600. Please advise.

## 2024-04-13 NOTE — Telephone Encounter (Signed)
 Attempted to reach pt on both numbers listed.  Home number, no answer and unable to leave a message. Cell number, voicemail is full and unable to leave a message.  (Was going to inform pt that we have sent this to our medication assistance team to see if there are options)

## 2024-04-17 ENCOUNTER — Other Ambulatory Visit (HOSPITAL_COMMUNITY): Payer: Self-pay

## 2024-04-17 ENCOUNTER — Ambulatory Visit: Admitting: Neurology

## 2024-04-19 ENCOUNTER — Other Ambulatory Visit: Payer: Self-pay | Admitting: Family Medicine

## 2024-04-21 ENCOUNTER — Ambulatory Visit

## 2024-04-21 DIAGNOSIS — I482 Chronic atrial fibrillation, unspecified: Secondary | ICD-10-CM

## 2024-04-24 LAB — CUP PACEART REMOTE DEVICE CHECK
Battery Remaining Longevity: 81 mo
Battery Voltage: 2.99 V
Brady Statistic AP VP Percent: 0 %
Brady Statistic AP VS Percent: 0 %
Brady Statistic AS VP Percent: 99.56 %
Brady Statistic AS VS Percent: 0.44 %
Brady Statistic RA Percent Paced: 0 %
Brady Statistic RV Percent Paced: 99.56 %
Date Time Interrogation Session: 20260130022149
Implantable Lead Connection Status: 753985
Implantable Lead Implant Date: 20210507
Implantable Lead Location: 753860
Implantable Lead Model: 5076
Implantable Pulse Generator Implant Date: 20210507
Lead Channel Impedance Value: 3306 Ohm
Lead Channel Impedance Value: 3306 Ohm
Lead Channel Impedance Value: 380 Ohm
Lead Channel Impedance Value: 399 Ohm
Lead Channel Pacing Threshold Amplitude: 0.75 V
Lead Channel Pacing Threshold Pulse Width: 0.4 ms
Lead Channel Sensing Intrinsic Amplitude: 1.875 mV
Lead Channel Sensing Intrinsic Amplitude: 12.75 mV
Lead Channel Sensing Intrinsic Amplitude: 8.625 mV
Lead Channel Setting Pacing Amplitude: 2.5 V
Lead Channel Setting Pacing Pulse Width: 0.4 ms
Lead Channel Setting Sensing Sensitivity: 4 mV
Zone Setting Status: 755011

## 2024-04-28 NOTE — Progress Notes (Signed)
 Remote PPM Transmission

## 2024-05-08 ENCOUNTER — Ambulatory Visit: Admitting: Orthopaedic Surgery

## 2024-05-16 ENCOUNTER — Ambulatory Visit: Admitting: Family Medicine

## 2024-05-18 ENCOUNTER — Other Ambulatory Visit

## 2024-07-21 ENCOUNTER — Ambulatory Visit

## 2024-08-21 ENCOUNTER — Ambulatory Visit: Admitting: Family

## 2024-10-20 ENCOUNTER — Ambulatory Visit

## 2025-04-03 ENCOUNTER — Encounter: Admitting: Family Medicine

## 2025-04-03 ENCOUNTER — Ambulatory Visit
